# Patient Record
Sex: Male | Born: 1935 | Race: Black or African American | Hispanic: No | Marital: Married | Smoking: Former smoker
Health system: Southern US, Community
[De-identification: ages and names within clinical notes are randomized; demographics above are authoritative.]

## PROBLEM LIST (undated history)

## (undated) DIAGNOSIS — F1011 Alcohol abuse, in remission: Secondary | ICD-10-CM

## (undated) DIAGNOSIS — I6529 Occlusion and stenosis of unspecified carotid artery: Secondary | ICD-10-CM

## (undated) DIAGNOSIS — I341 Nonrheumatic mitral (valve) prolapse: Secondary | ICD-10-CM

## (undated) DIAGNOSIS — N529 Male erectile dysfunction, unspecified: Secondary | ICD-10-CM

## (undated) DIAGNOSIS — I2510 Atherosclerotic heart disease of native coronary artery without angina pectoris: Secondary | ICD-10-CM

## (undated) DIAGNOSIS — N183 Chronic kidney disease, stage 3 unspecified: Secondary | ICD-10-CM

## (undated) DIAGNOSIS — E0590 Thyrotoxicosis, unspecified without thyrotoxic crisis or storm: Secondary | ICD-10-CM

## (undated) DIAGNOSIS — K279 Peptic ulcer, site unspecified, unspecified as acute or chronic, without hemorrhage or perforation: Secondary | ICD-10-CM

## (undated) DIAGNOSIS — G4733 Obstructive sleep apnea (adult) (pediatric): Secondary | ICD-10-CM

## (undated) DIAGNOSIS — I10 Essential (primary) hypertension: Secondary | ICD-10-CM

## (undated) DIAGNOSIS — R7303 Prediabetes: Secondary | ICD-10-CM

## (undated) DIAGNOSIS — E042 Nontoxic multinodular goiter: Secondary | ICD-10-CM

## (undated) DIAGNOSIS — I340 Nonrheumatic mitral (valve) insufficiency: Secondary | ICD-10-CM

## (undated) DIAGNOSIS — E785 Hyperlipidemia, unspecified: Secondary | ICD-10-CM

## (undated) DIAGNOSIS — N400 Enlarged prostate without lower urinary tract symptoms: Secondary | ICD-10-CM

## (undated) DIAGNOSIS — R413 Other amnesia: Secondary | ICD-10-CM

## (undated) DIAGNOSIS — I5189 Other ill-defined heart diseases: Secondary | ICD-10-CM

## (undated) DIAGNOSIS — I272 Pulmonary hypertension, unspecified: Secondary | ICD-10-CM

## (undated) DIAGNOSIS — Z9289 Personal history of other medical treatment: Secondary | ICD-10-CM

## (undated) DIAGNOSIS — G478 Other sleep disorders: Secondary | ICD-10-CM

## (undated) DIAGNOSIS — D649 Anemia, unspecified: Secondary | ICD-10-CM

## (undated) DIAGNOSIS — R011 Cardiac murmur, unspecified: Secondary | ICD-10-CM

## (undated) DIAGNOSIS — N19 Unspecified kidney failure: Secondary | ICD-10-CM

## (undated) DIAGNOSIS — E0500 Thyrotoxicosis with diffuse goiter without thyrotoxic crisis or storm: Secondary | ICD-10-CM

## (undated) HISTORY — DX: Occlusion and stenosis of unspecified carotid artery: I65.29

## (undated) HISTORY — DX: Unspecified kidney failure: N19

## (undated) HISTORY — DX: Nontoxic multinodular goiter: E04.2

## (undated) HISTORY — DX: Atherosclerotic heart disease of native coronary artery without angina pectoris: I25.10

## (undated) HISTORY — DX: Alcohol abuse, in remission: F10.11

## (undated) HISTORY — DX: Hyperlipidemia, unspecified: E78.5

## (undated) HISTORY — DX: Pulmonary hypertension, unspecified: I27.20

## (undated) HISTORY — DX: Obstructive sleep apnea (adult) (pediatric): G47.33

## (undated) HISTORY — DX: Male erectile dysfunction, unspecified: N52.9

## (undated) HISTORY — DX: Peptic ulcer, site unspecified, unspecified as acute or chronic, without hemorrhage or perforation: K27.9

## (undated) HISTORY — DX: Nonrheumatic mitral (valve) prolapse: I34.1

## (undated) HISTORY — DX: Essential (primary) hypertension: I10

## (undated) HISTORY — DX: Personal history of other medical treatment: Z92.89

## (undated) HISTORY — DX: Other sleep disorders: G47.8

## (undated) HISTORY — DX: Chronic kidney disease, stage 3 (moderate): N18.3

## (undated) HISTORY — DX: Thyrotoxicosis, unspecified without thyrotoxic crisis or storm: E05.90

## (undated) HISTORY — DX: Cardiac murmur, unspecified: R01.1

## (undated) HISTORY — DX: Thyrotoxicosis with diffuse goiter without thyrotoxic crisis or storm: E05.00

## (undated) HISTORY — PX: OTHER SURGICAL HISTORY: SHX169

## (undated) HISTORY — DX: Chronic kidney disease, stage 3 unspecified: N18.30

## (undated) HISTORY — PX: CARDIAC CATHETERIZATION: SHX172

## (undated) HISTORY — DX: Benign prostatic hyperplasia without lower urinary tract symptoms: N40.0

## (undated) HISTORY — DX: Other amnesia: R41.3

## (undated) HISTORY — PX: APPENDECTOMY: SHX54

## (undated) HISTORY — DX: Nonrheumatic mitral (valve) insufficiency: I34.0

## (undated) HISTORY — DX: Other ill-defined heart diseases: I51.89

---

## 1999-01-20 ENCOUNTER — Ambulatory Visit (HOSPITAL_COMMUNITY): Admission: RE | Admit: 1999-01-20 | Discharge: 1999-01-20 | Payer: Self-pay | Admitting: Gastroenterology

## 2001-03-17 ENCOUNTER — Ambulatory Visit (HOSPITAL_COMMUNITY): Admission: RE | Admit: 2001-03-17 | Discharge: 2001-03-17 | Payer: Self-pay | Admitting: Cardiology

## 2001-06-29 ENCOUNTER — Encounter: Admission: RE | Admit: 2001-06-29 | Discharge: 2001-06-29 | Payer: Self-pay | Admitting: Nephrology

## 2001-06-29 ENCOUNTER — Encounter: Payer: Self-pay | Admitting: Nephrology

## 2009-10-27 HISTORY — PX: HERNIA REPAIR: SHX51

## 2009-11-21 ENCOUNTER — Encounter: Admission: RE | Admit: 2009-11-21 | Discharge: 2009-11-21 | Payer: Self-pay | Admitting: General Surgery

## 2009-11-26 ENCOUNTER — Ambulatory Visit (HOSPITAL_BASED_OUTPATIENT_CLINIC_OR_DEPARTMENT_OTHER): Admission: RE | Admit: 2009-11-26 | Discharge: 2009-11-26 | Payer: Self-pay | Admitting: General Surgery

## 2010-04-08 ENCOUNTER — Encounter
Admission: RE | Admit: 2010-04-08 | Discharge: 2010-04-08 | Payer: Self-pay | Source: Home / Self Care | Attending: Family Medicine | Admitting: Family Medicine

## 2010-04-10 ENCOUNTER — Encounter
Admission: RE | Admit: 2010-04-10 | Discharge: 2010-04-10 | Payer: Self-pay | Source: Home / Self Care | Attending: Family Medicine | Admitting: Family Medicine

## 2010-05-19 ENCOUNTER — Other Ambulatory Visit (HOSPITAL_COMMUNITY): Payer: Self-pay | Admitting: Internal Medicine

## 2010-05-19 DIAGNOSIS — E0590 Thyrotoxicosis, unspecified without thyrotoxic crisis or storm: Secondary | ICD-10-CM

## 2010-05-19 DIAGNOSIS — E049 Nontoxic goiter, unspecified: Secondary | ICD-10-CM

## 2010-05-26 ENCOUNTER — Other Ambulatory Visit (HOSPITAL_COMMUNITY): Payer: Self-pay | Admitting: Cardiology

## 2010-05-26 DIAGNOSIS — I2782 Chronic pulmonary embolism: Secondary | ICD-10-CM

## 2010-05-28 ENCOUNTER — Ambulatory Visit (HOSPITAL_COMMUNITY)
Admission: RE | Admit: 2010-05-28 | Discharge: 2010-05-28 | Disposition: A | Discharge status: Home / Self Care | Payer: Medicare Other | Source: Ambulatory Visit | Attending: Cardiology | Admitting: Cardiology

## 2010-05-28 DIAGNOSIS — J449 Chronic obstructive pulmonary disease, unspecified: Secondary | ICD-10-CM | POA: Insufficient documentation

## 2010-05-28 DIAGNOSIS — J4489 Other specified chronic obstructive pulmonary disease: Secondary | ICD-10-CM | POA: Insufficient documentation

## 2010-05-29 ENCOUNTER — Ambulatory Visit (HOSPITAL_COMMUNITY)
Admission: RE | Admit: 2010-05-29 | Discharge: 2010-05-29 | Disposition: A | Discharge status: Home / Self Care | Payer: Medicare Other | Source: Ambulatory Visit | Attending: Cardiology | Admitting: Cardiology

## 2010-05-29 DIAGNOSIS — R0602 Shortness of breath: Secondary | ICD-10-CM | POA: Insufficient documentation

## 2010-05-29 DIAGNOSIS — I2782 Chronic pulmonary embolism: Secondary | ICD-10-CM

## 2010-05-29 DIAGNOSIS — R079 Chest pain, unspecified: Secondary | ICD-10-CM | POA: Insufficient documentation

## 2010-05-29 MED ORDER — XENON XE 133 GAS
10.0000 | GAS_FOR_INHALATION | Freq: Once | RESPIRATORY_TRACT | Status: AC | PRN
  Administered 2010-05-29: 10 via RESPIRATORY_TRACT

## 2010-05-29 MED ORDER — TECHNETIUM TO 99M ALBUMIN AGGREGATED
6.0000 | Freq: Once | INTRAVENOUS | Status: AC | PRN
  Administered 2010-05-29: 6 via INTRAVENOUS

## 2010-06-11 ENCOUNTER — Ambulatory Visit (HOSPITAL_COMMUNITY): Payer: Self-pay

## 2010-06-12 ENCOUNTER — Other Ambulatory Visit (HOSPITAL_COMMUNITY): Payer: Self-pay

## 2010-06-12 LAB — DIFFERENTIAL
Lymphocytes Relative: 16 % (ref 12–46)
Lymphs Abs: 1.1 10*3/uL (ref 0.7–4.0)
Neutro Abs: 5.1 10*3/uL (ref 1.7–7.7)
Neutrophils Relative %: 72 % (ref 43–77)

## 2010-06-12 LAB — COMPREHENSIVE METABOLIC PANEL
AST: 21 U/L (ref 0–37)
BUN: 28 mg/dL — ABNORMAL HIGH (ref 6–23)
CO2: 28 mEq/L (ref 19–32)
Calcium: 9.7 mg/dL (ref 8.4–10.5)
Creatinine, Ser: 1.61 mg/dL — ABNORMAL HIGH (ref 0.4–1.5)
GFR calc Af Amer: 51 mL/min — ABNORMAL LOW (ref >60)
GFR calc non Af Amer: 42 mL/min — ABNORMAL LOW (ref >60)
Glucose, Bld: 102 mg/dL — ABNORMAL HIGH (ref 70–99)

## 2010-06-12 LAB — GLUCOSE, CAPILLARY: Glucose-Capillary: 122 mg/dL — ABNORMAL HIGH (ref 70–99)

## 2010-06-12 LAB — CBC
Hemoglobin: 12 g/dL — ABNORMAL LOW (ref 13.0–17.0)
MCH: 31.3 pg (ref 26.0–34.0)
MCHC: 34 g/dL (ref 30.0–36.0)

## 2010-06-30 ENCOUNTER — Other Ambulatory Visit (HOSPITAL_COMMUNITY): Payer: Self-pay | Admitting: Internal Medicine

## 2010-06-30 DIAGNOSIS — E042 Nontoxic multinodular goiter: Secondary | ICD-10-CM

## 2010-06-30 DIAGNOSIS — E0590 Thyrotoxicosis, unspecified without thyrotoxic crisis or storm: Secondary | ICD-10-CM

## 2010-07-09 ENCOUNTER — Encounter (HOSPITAL_COMMUNITY)
Admission: RE | Admit: 2010-07-09 | Discharge: 2010-07-09 | Disposition: A | Discharge status: Home / Self Care | Payer: Medicare Other | Source: Ambulatory Visit | Attending: Internal Medicine | Admitting: Internal Medicine

## 2010-07-09 DIAGNOSIS — E042 Nontoxic multinodular goiter: Secondary | ICD-10-CM

## 2010-07-09 DIAGNOSIS — E0590 Thyrotoxicosis, unspecified without thyrotoxic crisis or storm: Secondary | ICD-10-CM

## 2010-07-10 ENCOUNTER — Ambulatory Visit (HOSPITAL_COMMUNITY)
Admission: RE | Admit: 2010-07-10 | Discharge: 2010-07-10 | Disposition: A | Discharge status: Home / Self Care | Payer: Medicare Other | Source: Ambulatory Visit | Attending: Internal Medicine | Admitting: Internal Medicine

## 2010-07-10 DIAGNOSIS — E0520 Thyrotoxicosis with toxic multinodular goiter without thyrotoxic crisis or storm: Secondary | ICD-10-CM | POA: Insufficient documentation

## 2010-07-10 MED ORDER — SODIUM IODIDE I 131 CAPSULE
15.0000 | Freq: Once | INTRAVENOUS | Status: AC | PRN
  Administered 2010-07-10: 15 via ORAL

## 2010-07-10 MED ORDER — SODIUM PERTECHNETATE TC 99M INJECTION
10.0000 | Freq: Once | INTRAVENOUS | Status: AC | PRN
  Administered 2010-07-10: 10 via INTRAVENOUS

## 2010-08-07 ENCOUNTER — Other Ambulatory Visit (HOSPITAL_COMMUNITY): Payer: Self-pay | Admitting: Internal Medicine

## 2010-08-07 DIAGNOSIS — E0590 Thyrotoxicosis, unspecified without thyrotoxic crisis or storm: Secondary | ICD-10-CM

## 2010-08-14 ENCOUNTER — Encounter (HOSPITAL_COMMUNITY)
Admission: RE | Admit: 2010-08-14 | Discharge: 2010-08-14 | Disposition: A | Discharge status: Home / Self Care | Payer: Medicare Other | Source: Ambulatory Visit | Attending: Internal Medicine | Admitting: Internal Medicine

## 2010-08-14 DIAGNOSIS — E0590 Thyrotoxicosis, unspecified without thyrotoxic crisis or storm: Secondary | ICD-10-CM | POA: Insufficient documentation

## 2010-08-14 NOTE — Cardiovascular Report (Signed)
[place]. [facility]  Patient:    [name], [name] Visit Number: [id number] MRN: [id number]          Service Type: CAT Location: [place] 01 Attending Physician:  Laverda Page Dictated by:   Kela Millin, M.D. Proc. Date: 03/17/01 Admit Date:  03/17/2001 Discharge Date: 03/17/2001   CC:         Sherren Kerns. Pamella Pert, M.D.  [facility] [address] [place]   Cardiac Catheterization  PROCEDURE: 1. Selective left ventriculography. 2. Selective left and right coronary arteriography. 3. Selective left and right renal arteriography. 4. Right femoral arteriography and closure of right femoral artery access    site with Perclose.  CARDIOLOGIST:  Kela Millin, M.D.  INDICATIONS:  Jeffrey Campbell is a 77 year old African-American male with history of hypertension.  He was referred to me by Dr. Pamella Pert after he had an exercise stress test for chest pain.  The stress test was abnormal.  Also, his symptoms were fairly typical for angina pectoris.  He also had difficult-to-control hypertension and had recently been very difficult to control.  Due to this suspicion for uncontrolled hypertension and underlying coronary artery disease, he was referred for cardiac catheterization and renal arteriography.  HEMODYNAMIC DATA:  Left ventricle pressures were 124/4 with an end-diastolic pressure of 14 mmHg.  The central aortic pressures were 129/63 with a mean of 86 mmHg. There was no pressure gradient across the aortic valve.  ANGIOGRAPHIC DATA:  CORONARY ARTERIES:  Left ventriculography: The left ventriculography revealed well preserved left ventricular function, ejection fraction 60%.  There was no significant mitral regurgitation.  CORONARY ARTERIOGRAPHY:  Right coronary artery: The right coronary artery is mildly calcified.  It is tortuous in the proximal segment.  Just before the origin of a moderate size RV acute  marginal branch, there is a 60% stenosis which appears to be eccentric.  In the mid segment at the crux, there is about a 40% stenosis.  It gives origin to a very large PDA which had moderate diffuse disease in the distal segment.  The posterolateral branch is very small.  Left main coronary artery: The left main coronary artery is a large caliber vessel.  It is mildly calcified.  It bifurcates into the left anterior descending artery and circumflex coronary artery.  Left anterior descending artery:  Left anterior descending artery is a large caliber vessel.  It is mildly calcified.  The ostium of the left anterior descending artery is also calcified, has about 50, maybe at the most 60% stenosis.   The LAD gives origin to a moderate to large size diagonal #1 and diagonal #2 which have ostial calcification with 20% ostial stenosis.  Just after the origin of diagonal #2, there is a 50 to 60% calcific stenosis of the mid left anterior descending artery.  There is a small diagonal #3.  The LAD otherwise has mild disease distally.  Circumflex coronary artery: The circumflex coronary artery is a large caliber vessel.  It gives origin to small obtuse marginal #1 and and a moderate size obtuse marginal #2 and a larger obtuse marginal #3.  After the origin of a small obtuse marginal #1, there is about 30% stenosis.  The distal circumflex has mild luminal irregularities.  VENAL ARTERIOGRAPHY:  The abdominal aorta has mild to moderate atherosclerotic changes with mild calcification.  The right renal artery has ostial 60% stenosis.  The rest of the artery is widely patent and a large caliber  vessel.  The renal parenchyma has a normal blush, and the renal artery size appears to be normal.  The left renal artery has about 70 to 80% ostial stenosis with mild calcification.  The renal parenchyma is normal, and the renal artery size appears to be normal.  FINAL INTERPRETATION: 1. Normal left  ventricular systolic function, ejection fraction 60%. 2. Borderline lesion of the mid right coronary artery with moderate diffuse    disease distally. 3. Ostial and mid left anterior descending artery borderline lesions with    mild calcification. 4. Bilateral renal artery stenosis.  RECOMMENDATIONS: 1. Due to borderline lesions, the patient will undergo Cardiolite stress test    to evaluate the functional significant of these lesions. 2. Renal duplex scan will be performed.  The patient will possibly need renal    angioplasty for uncontrolled hypertension and for preservation of    renal parenchyma. 3. Aggressive risk factor modification is indicated including control of    hypertension.  TECHNIQUE OF PROCEDURE:  Under the the usual sterile precautions using 6-French right femoral access, a 6-French multipurpose B2 catheter was advanced into the ascending aorta over the 0.035 mm J wire.  The catheter was gently advanced into the left ventricle and left ventricle pressures were monitored.  Hand contrast injection of left ventricle was performed in the RAO projection.  The catheter was then flushed with saline, pulled back into the ascending aorta, and pressure gradient across the aortic valve was monitored. The right coronary artery was selectively engaged, and arteriography was performed.  Then the catheter was pulled back into the abdominal aorta and selective right and left renal arteriography was performed.  Then the catheter was pulled out of the body.  A 6-French Judkins left diagnostic catheter was advanced in the ascending aorta over a 0.035 mm J wire.  The left main coronary artery was selectively engaged, and arteriography was performed. Then the catheter was pulled out of the body over the 0.035 mm J wire.  Right femoral arteriography was performed in the RAO projection, and Perclose  closure was attempted.  However, because of inability to attain adequate hemostasis,  manual pressure was held, and the patient was transferred to the floor in stable condition. Dictated by:   Kela Millin, M.D. Attending Physician:  Laverda Page DD:  03/17/01 TD:  03/18/01 Job: [id number]

## 2010-08-26 DIAGNOSIS — E0590 Thyrotoxicosis, unspecified without thyrotoxic crisis or storm: Secondary | ICD-10-CM

## 2010-08-26 HISTORY — DX: Thyrotoxicosis, unspecified without thyrotoxic crisis or storm: E05.90

## 2010-08-26 MED ORDER — SODIUM IODIDE I 131 CAPSULE: 16.1000 | Freq: Once | INTRAVENOUS | Status: DC | PRN

## 2010-08-26 MED ORDER — SODIUM IODIDE I 131 CAPSULE: 16.1000 | Freq: Once | INTRAVENOUS | Status: AC | PRN

## 2010-09-18 ENCOUNTER — Encounter: Payer: Self-pay | Admitting: Critical Care Medicine

## 2010-09-21 ENCOUNTER — Ambulatory Visit (INDEPENDENT_AMBULATORY_CARE_PROVIDER_SITE_OTHER): Payer: Medicare Other | Admitting: Critical Care Medicine

## 2010-09-21 ENCOUNTER — Encounter: Payer: Self-pay | Admitting: Critical Care Medicine

## 2010-09-21 DIAGNOSIS — G4733 Obstructive sleep apnea (adult) (pediatric): Secondary | ICD-10-CM

## 2010-09-21 DIAGNOSIS — J449 Chronic obstructive pulmonary disease, unspecified: Secondary | ICD-10-CM | POA: Insufficient documentation

## 2010-09-21 DIAGNOSIS — I2510 Atherosclerotic heart disease of native coronary artery without angina pectoris: Secondary | ICD-10-CM | POA: Insufficient documentation

## 2010-09-21 DIAGNOSIS — E0590 Thyrotoxicosis, unspecified without thyrotoxic crisis or storm: Secondary | ICD-10-CM

## 2010-09-21 DIAGNOSIS — N183 Chronic kidney disease, stage 3 unspecified: Secondary | ICD-10-CM

## 2010-09-21 DIAGNOSIS — I10 Essential (primary) hypertension: Secondary | ICD-10-CM

## 2010-09-21 DIAGNOSIS — E785 Hyperlipidemia, unspecified: Secondary | ICD-10-CM

## 2010-09-21 DIAGNOSIS — I130 Hypertensive heart and chronic kidney disease with heart failure and stage 1 through stage 4 chronic kidney disease, or unspecified chronic kidney disease: Secondary | ICD-10-CM | POA: Insufficient documentation

## 2010-09-21 DIAGNOSIS — J988 Other specified respiratory disorders: Secondary | ICD-10-CM

## 2010-09-21 DIAGNOSIS — E119 Type 2 diabetes mellitus without complications: Secondary | ICD-10-CM

## 2010-09-21 DIAGNOSIS — E039 Hypothyroidism, unspecified: Secondary | ICD-10-CM | POA: Insufficient documentation

## 2010-09-21 DIAGNOSIS — K279 Peptic ulcer, site unspecified, unspecified as acute or chronic, without hemorrhage or perforation: Secondary | ICD-10-CM

## 2010-09-21 DIAGNOSIS — G478 Other sleep disorders: Secondary | ICD-10-CM

## 2010-09-21 MED ORDER — TIOTROPIUM BROMIDE MONOHYDRATE 18 MCG IN CAPS: 18.0000 ug | ORAL_CAPSULE | Freq: Every day | RESPIRATORY_TRACT | Status: DC

## 2010-09-21 NOTE — Progress Notes (Signed)
Subjective:    Patient ID: Jeffrey Campbell, male    DOB: 1932-05-30, 77 y.o.   MRN: [id number]  HPI  77 y.o.AAM pulm HTN on echo.  Saw PCP  ? Heart murmur.  Then Echo done and pulm HTN.  Then Sleep study done, insurance would not pay for Bipap. This therapy not yet started.  Airflow obstruction on PFTs   No smoking in 31yrs.  Since 88 no smoking.  Prior to this was a heavy smoker. No real wheezing.  Pt notes some dyspnea since had heart eval.  Now is noting dyspnea and leg edema.  No real cough.  No real chest tightness.   Past Medical History  Diagnosis Date  . PUD (peptic ulcer disease)   . Hypertension   . Hyperlipemia   . CAD (coronary artery disease)   . Shutdown renal     after cardiac cath  . Organic impotence   . CKD (chronic kidney disease), stage III   . History of ETOH abuse   . Type 2 diabetes, diet controlled   . Pulmonary hypertension   . Hyperthyroidism   . Multiple thyroid nodules   . Upper airway resistance syndrome   . Carotid artery bruit   . Heart murmur, systolic   . OSA (obstructive sleep apnea)      Family History  Problem Relation Age of Onset  . Kidney failure Father   . Hypertension Father   . Colon cancer Mother   . Rectal cancer Sister   . Colon cancer Brother   . Lung disease Brother      History   Social History  . Marital Status: Married    Spouse Name: N/A    Number of Children: 4  . Years of Education: N/A   Occupational History  . Retired     [employer]   Social History Main Topics  . Smoking status: Former Smoker -- 2.0 packs/day for 40 years    Types: Cigarettes    Quit date: 03/29/1984  . Smokeless tobacco: Never Used  . Alcohol Use: No     quit in 1981  . Drug Use: No  . Sexually Active: Not on file   Other Topics Concern  . Not on file   Social History Narrative  . No narrative on file     Allergies  Allergen Reactions  . Zocor (Simvastatin)      No outpatient prescriptions prior to visit.       Review of Systems  Constitutional: Positive for diaphoresis and unexpected weight change. Negative for fever, chills, activity change, appetite change and fatigue.  HENT: Positive for postnasal drip. Negative for hearing loss, ear pain, nosebleeds, congestion, sore throat, facial swelling, rhinorrhea, sneezing, mouth sores, trouble swallowing, neck pain, neck stiffness, dental problem, voice change, sinus pressure, tinnitus and ear discharge.   Eyes: Negative for photophobia, discharge, itching and visual disturbance.  Respiratory: Positive for shortness of breath. Negative for apnea, cough, choking, chest tightness, wheezing and stridor.   Cardiovascular: Positive for chest pain and leg swelling. Negative for palpitations.  Gastrointestinal: Negative for nausea, vomiting, abdominal pain, constipation, blood in stool and abdominal distention.  Genitourinary: Positive for frequency. Negative for dysuria, urgency, hematuria, flank pain, decreased urine volume and difficulty urinating.  Musculoskeletal: Positive for back pain. Negative for myalgias, joint swelling, arthralgias and gait problem.  Skin: Negative for color change, pallor and rash.  Neurological: Negative for dizziness, tremors, seizures, syncope, speech difficulty, weakness, light-headedness, numbness and headaches.  Hematological: Negative for adenopathy. Does not bruise/bleed easily.  Psychiatric/Behavioral: Negative for confusion, sleep disturbance and agitation. The patient is not nervous/anxious.        Objective:   Physical Exam Filed Vitals:   09/21/10 1607  BP: 162/58  Pulse: 51  Temp: 98.1 F (36.7 C)  TempSrc: Oral  Height: 5\' 9"  (1.753 m)  Weight: 162 lb 6.4 oz (73.664 kg)  SpO2: 96%    Gen: Pleasant, well-nourished, in no distress,  normal affect  ENT: No lesions,  mouth clear,  oropharynx clear, no postnasal drip  Neck: No JVD, no TMG, no carotid bruits  Lungs: No use of accessory muscles, no  dullness to percussion, distant BS  Cardiovascular: RRR, heart sounds normal, no murmur or gallops, no peripheral edema  Abdomen: soft and NT, no HSM,  BS normal  Musculoskeletal: No deformities, no cyanosis or clubbing  Neuro: alert, non focal  Skin: Warm, no lesions or rashes      CXR mod copd changes Arlyce Harman: Mod airflow obstruction Echo: reviewed: mod pulm HTN  Assessment & Plan:   COPD (chronic obstructive pulmonary disease) Moderate COPD with airflow obstruction as cause for pulm htn in addition to sleep apnea Plan F/u cpap therapy per dr turner Start spiriva     Updated Medication List Outpatient Encounter Prescriptions as of 09/21/2010  Medication Sig Dispense Refill  . amlodipine-atorvastatin (CADUET) 10-20 MG per tablet Once daily      . aspirin 325 MG tablet Take 325 mg by mouth daily.        . carvedilol (COREG) 25 MG tablet 1/2 tablet twice daily      . furosemide (LASIX) 40 MG tablet 1/2 tablet as needed if home weight . 155 or onset of shortness of breath      . losartan (COZAAR) 100 MG tablet 1/2 tablet twice daily      . Multiple Vitamin (MULTIVITAMIN) tablet Take 1 tablet by mouth daily.        . nitroGLYCERIN (NITROSTAT) 0.4 MG SL tablet Place 0.4 mg under the tongue every 5 (five) minutes as needed.        . TRAVATAN Z 0.004 % ophthalmic solution Daily at bedtime in each eye      . WELCHOL 625 MG tablet Once daily      . tiotropium (SPIRIVA HANDIHALER) 18 MCG inhalation capsule Place 1 capsule (18 mcg total) into inhaler and inhale daily.  30 capsule  6

## 2010-09-21 NOTE — Patient Instructions (Signed)
Start Spiriva daily Follow up with Dr Radford Pax on your sleep treatment Return 4 months

## 2010-09-22 ENCOUNTER — Encounter: Payer: Self-pay | Admitting: Critical Care Medicine

## 2010-09-22 ENCOUNTER — Telehealth: Payer: Self-pay | Admitting: Critical Care Medicine

## 2010-09-22 NOTE — Telephone Encounter (Signed)
i spoke to the pt 

## 2010-09-22 NOTE — Assessment & Plan Note (Addendum)
Moderate COPD with airflow obstruction as cause for pulm htn in addition to sleep apnea Plan F/u cpap therapy per dr Radford Pax Start spiriva

## 2010-09-22 NOTE — Telephone Encounter (Signed)
Spoke with pt.  He states that he was reading spiriva pamphlet and is concerned b/c it states that you should not take med with enlarged prostate or if you have glaucoma. He states that he has both of these conditions and wants to know if PW thinks it is okay for him to take. Please advise, thanks!

## 2010-09-24 ENCOUNTER — Encounter: Payer: Self-pay | Admitting: Critical Care Medicine

## 2010-09-25 ENCOUNTER — Telehealth: Payer: Self-pay | Admitting: Critical Care Medicine

## 2010-09-25 DIAGNOSIS — J449 Chronic obstructive pulmonary disease, unspecified: Secondary | ICD-10-CM

## 2010-09-25 MED ORDER — TIOTROPIUM BROMIDE MONOHYDRATE 18 MCG IN CAPS: 18.0000 ug | ORAL_CAPSULE | Freq: Every day | RESPIRATORY_TRACT | Status: DC

## 2010-09-25 NOTE — Telephone Encounter (Signed)
Pt request rx be sent to [facility] for 90 day instead of local pharmacy. Pt also request sample to last until mail order comes. Sample at from rx sent. Pt aware. Carpio Bing, CMA

## 2011-01-26 ENCOUNTER — Ambulatory Visit (INDEPENDENT_AMBULATORY_CARE_PROVIDER_SITE_OTHER): Payer: Medicare Other | Admitting: Critical Care Medicine

## 2011-01-26 ENCOUNTER — Encounter: Payer: Self-pay | Admitting: Critical Care Medicine

## 2011-01-26 VITALS — BP 162/72 | HR 50 | Temp 97.7°F | Ht 69.0 in | Wt 176.8 lb

## 2011-01-26 DIAGNOSIS — J449 Chronic obstructive pulmonary disease, unspecified: Secondary | ICD-10-CM

## 2011-01-26 MED ORDER — BUDESONIDE-FORMOTEROL FUMARATE 160-4.5 MCG/ACT IN AERO: 2.0000 | INHALATION_SPRAY | Freq: Two times a day (BID) | RESPIRATORY_TRACT | Status: DC

## 2011-01-26 NOTE — Progress Notes (Signed)
P Subjective:    Patient ID: Jeffrey Campbell, male    DOB: 07-03-1932, 77 y.o.   MRN: [id number]  HPI   77 y.o.AAM pulm HTN on echo.  Saw PCP  ? Heart murmur.  Then Echo done and pulm HTN.  Then Sleep study done, insurance would not pay for Bipap. This therapy not yet started.  Airflow obstruction on PFTs   No smoking in 51yrs.  Since 88 no smoking.  Prior to this was a heavy smoker. No real wheezing.  Pt notes some dyspnea since had heart eval.  Now is noting dyspnea and leg edema.  No real cough.  No real chest tightness.  10/30 Did not get cpap.  Insurance would not approve , notes dry mouth and no change on spiriva. No real cough. Phlegm is thick and sticky.  Mouth real dry.  Awakens with nose irritated.   Past Medical History  Diagnosis Date  . PUD (peptic ulcer disease)   . Hypertension   . Hyperlipemia   . CAD (coronary artery disease)   . Shutdown renal     after cardiac cath  . Organic impotence   . CKD (chronic kidney disease), stage III   . History of ETOH abuse   . Type 2 diabetes, diet controlled   . Pulmonary hypertension   . Hyperthyroidism   . Multiple thyroid nodules   . Upper airway resistance syndrome   . Carotid artery bruit   . Heart murmur, systolic   . OSA (obstructive sleep apnea)   . Glaucoma   . BPH (benign prostatic hyperplasia)      Family History  Problem Relation Age of Onset  . Kidney failure Father   . Hypertension Father   . Colon cancer Mother   . Rectal cancer Sister   . Colon cancer Brother   . Lung disease Brother      History   Social History  . Marital Status: Married    Spouse Name: N/A    Number of Children: 4  . Years of Education: N/A   Occupational History  . Retired     [employer]   Social History Main Topics  . Smoking status: Former Smoker -- 2.0 packs/day for 40 years    Types: Cigarettes    Quit date: 03/29/1984  . Smokeless tobacco: Never Used  . Alcohol Use: No     quit in 1981  . Drug Use: No    . Sexually Active: Not on file   Other Topics Concern  . Not on file   Social History Narrative  . No narrative on file     Allergies  Allergen Reactions  . Spiriva Handihaler Other (See Comments)    Dry mouth  . Zocor (Simvastatin)      Outpatient Prescriptions Prior to Visit  Medication Sig Dispense Refill  . amlodipine-atorvastatin (CADUET) 10-20 MG per tablet Once daily      . furosemide (LASIX) 40 MG tablet 1/2 tablet as needed if home weight . 155 or onset of shortness of breath      . losartan (COZAAR) 100 MG tablet 1/2 tablet twice daily      . Multiple Vitamin (MULTIVITAMIN) tablet Take 1 tablet by mouth daily.        . nitroGLYCERIN (NITROSTAT) 0.4 MG SL tablet Place 0.4 mg under the tongue every 5 (five) minutes as needed.        . TRAVATAN Z 0.004 % ophthalmic solution Daily at bedtime in  each eye      . WELCHOL 625 MG tablet 3 tablets Once daily      . tiotropium (SPIRIVA HANDIHALER) 18 MCG inhalation capsule Place 1 capsule (18 mcg total) into inhaler and inhale daily.  90 capsule  2  . aspirin 325 MG tablet Take 325 mg by mouth daily.        . carvedilol (COREG) 25 MG tablet 1/2 tablet twice daily          Review of Systems  Constitutional: Positive for diaphoresis. Negative for fever, chills, activity change, appetite change, fatigue and unexpected weight change.  HENT: Positive for postnasal drip. Negative for hearing loss, ear pain, nosebleeds, congestion, sore throat, facial swelling, rhinorrhea, sneezing, mouth sores, trouble swallowing, neck pain, neck stiffness, dental problem, voice change, sinus pressure, tinnitus and ear discharge.   Eyes: Negative for photophobia, discharge, itching and visual disturbance.  Respiratory: Positive for shortness of breath. Negative for apnea, cough, choking, chest tightness, wheezing and stridor.   Cardiovascular: Negative for chest pain, palpitations and leg swelling.  Gastrointestinal: Negative for nausea, vomiting,  abdominal pain, constipation, blood in stool and abdominal distention.  Genitourinary: Positive for frequency. Negative for dysuria, urgency, hematuria, flank pain, decreased urine volume and difficulty urinating.  Musculoskeletal: Positive for back pain. Negative for myalgias, joint swelling, arthralgias and gait problem.  Skin: Negative for color change, pallor and rash.  Neurological: Negative for dizziness, tremors, seizures, syncope, speech difficulty, weakness, light-headedness, numbness and headaches.  Hematological: Negative for adenopathy. Does not bruise/bleed easily.  Psychiatric/Behavioral: Negative for confusion, sleep disturbance and agitation. The patient is not nervous/anxious.        Objective:   Physical Exam  Filed Vitals:   01/26/11 0924  BP: 162/72  Pulse: 50  Temp: 97.7 F (36.5 C)  TempSrc: Oral  Height: 5\' 9"  (1.753 m)  Weight: 176 lb 12.8 oz (80.196 kg)  SpO2: 99%    Gen: Pleasant, well-nourished, in no distress,  normal affect  ENT: No lesions,  mouth clear,  oropharynx clear, no postnasal drip  Neck: No JVD, no TMG, no carotid bruits  Lungs: No use of accessory muscles, no dullness to percussion, distant BS  Cardiovascular: RRR, heart sounds normal, no murmur or gallops, no peripheral edema  Abdomen: soft and NT, no HSM,  BS normal  Musculoskeletal: No deformities, no cyanosis or clubbing  Neuro: alert, non focal  Skin: Warm, no lesions or rashes      CXR mod copd changes Arlyce Harman: Mod airflow obstruction Echo: reviewed: mod pulm HTN  Assessment & Plan:   COPD (chronic obstructive pulmonary disease) Golds stage III COPD with moderate pulmonary hypertension Significant side effects with Spiriva including dry mouth and excess secretions Plan Stop Spiriva  Start Symbicort 160 two puff twice daily The patient was instructed as to proper HFA use and has about a 60% usage efficiency The patient will continue training on HFA device at return  in one month, samples were given of the Symbicort     Updated Medication List Outpatient Encounter Prescriptions as of 01/26/2011  Medication Sig Dispense Refill  . amlodipine-atorvastatin (CADUET) 10-20 MG per tablet Once daily      . aspirin 81 MG tablet Take 81 mg by mouth daily.        . cloNIDine (CATAPRES) 0.1 MG tablet Take 0.5 tablets by mouth Twice daily.      . furosemide (LASIX) 40 MG tablet 1/2 tablet as needed if home weight . 155 or onset of  shortness of breath      . levothyroxine (SYNTHROID, LEVOTHROID) 50 MCG tablet Take 1 tablet by mouth Every morning.      Marland Kitchen losartan (COZAAR) 100 MG tablet 1/2 tablet twice daily      . Multiple Vitamin (MULTIVITAMIN) tablet Take 1 tablet by mouth daily.        . nitroGLYCERIN (NITROSTAT) 0.4 MG SL tablet Place 0.4 mg under the tongue every 5 (five) minutes as needed.        . TRAVATAN Z 0.004 % ophthalmic solution Daily at bedtime in each eye      . WELCHOL 625 MG tablet 3 tablets Once daily      . DISCONTD: tiotropium (SPIRIVA HANDIHALER) 18 MCG inhalation capsule Place 1 capsule (18 mcg total) into inhaler and inhale daily.  90 capsule  2  . budesonide-formoterol (SYMBICORT) 160-4.5 MCG/ACT inhaler Inhale 2 puffs into the lungs 2 (two) times daily.  1 Inhaler  2  . DISCONTD: aspirin 325 MG tablet Take 325 mg by mouth daily.        Marland Kitchen DISCONTD: carvedilol (COREG) 25 MG tablet 1/2 tablet twice daily

## 2011-01-26 NOTE — Patient Instructions (Signed)
Stop spiriva Start Symbicort two puff twice daily, use samples Return 1 month

## 2011-01-26 NOTE — Assessment & Plan Note (Addendum)
Golds stage III COPD with moderate pulmonary hypertension Significant side effects with Spiriva including dry mouth and excess secretions Plan Stop Spiriva  Start Symbicort 160 two puff twice daily The patient was instructed as to proper HFA use and has about a 60% usage efficiency The patient will continue training on HFA device at return in one month, samples were given of the Symbicort

## 2011-02-26 ENCOUNTER — Ambulatory Visit (INDEPENDENT_AMBULATORY_CARE_PROVIDER_SITE_OTHER): Payer: Medicare Other | Admitting: Critical Care Medicine

## 2011-02-26 ENCOUNTER — Encounter: Payer: Self-pay | Admitting: Critical Care Medicine

## 2011-02-26 DIAGNOSIS — J449 Chronic obstructive pulmonary disease, unspecified: Secondary | ICD-10-CM

## 2011-02-26 MED ORDER — BUDESONIDE-FORMOTEROL FUMARATE 160-4.5 MCG/ACT IN AERO: 2.0000 | INHALATION_SPRAY | Freq: Two times a day (BID) | RESPIRATORY_TRACT | Status: DC

## 2011-02-26 NOTE — Progress Notes (Signed)
P Subjective:    Patient ID: Jeffrey Campbell, male    DOB: 02-06-1933, 77 y.o.   MRN: [id number]  HPI   77 y.o.AAM pulm HTN on echo.  Saw PCP  ? Heart murmur.  Then Echo done and pulm HTN.  Then Sleep study done, insurance would not pay for Bipap. This therapy not yet started.  Airflow obstruction on PFTs   No smoking in 55yrs.  Since 88 no smoking.  Prior to this was a heavy smoker. No real wheezing.  Pt notes some dyspnea since had heart eval.  Now is noting dyspnea and leg edema.  No real cough.  No real chest tightness.  10/30 Did not get cpap.  Insurance would not approve , notes dry mouth and no change on spiriva. No real cough. Phlegm is thick and sticky.  Mouth real dry.  Awakens with nose irritated.  11/30 Dry mouth better on symbicort vs spiriva.   Dyspnea is ok.  No real chest pain. Mucus is less. Pt denies any significant sore throat, nasal congestion or excess secretions, fever, chills, sweats, unintended weight loss, pleurtic or exertional chest pain, orthopnea PND, or leg swelling Pt denies any increase in rescue therapy over baseline, denies waking up needing it or having any early am or nocturnal exacerbations of coughing/wheezing/or dyspnea. Pt also denies any obvious fluctuation in symptoms with  weather or environmental change or other alleviating or aggravating factors   Past Medical History  Diagnosis Date  . PUD (peptic ulcer disease)   . Hypertension   . Hyperlipemia   . CAD (coronary artery disease)   . Shutdown renal     after cardiac cath  . Organic impotence   . CKD (chronic kidney disease), stage III   . History of ETOH abuse   . Type 2 diabetes, diet controlled   . Pulmonary hypertension   . Hyperthyroidism   . Multiple thyroid nodules   . Upper airway resistance syndrome   . Carotid artery bruit   . Heart murmur, systolic   . OSA (obstructive sleep apnea)   . Glaucoma   . BPH (benign prostatic hyperplasia)      Family History  Problem  Relation Age of Onset  . Kidney failure Father   . Hypertension Father   . Colon cancer Mother   . Rectal cancer Sister   . Colon cancer Brother   . Lung disease Brother      History   Social History  . Marital Status: Married    Spouse Name: N/A    Number of Children: 4  . Years of Education: N/A   Occupational History  . Retired     [employer]   Social History Main Topics  . Smoking status: Former Smoker -- 2.0 packs/day for 40 years    Types: Cigarettes    Quit date: 03/29/1984  . Smokeless tobacco: Never Used  . Alcohol Use: No     quit in 1981  . Drug Use: No  . Sexually Active: Not on file   Other Topics Concern  . Not on file   Social History Narrative  . No narrative on file     Allergies  Allergen Reactions  . Spiriva Handihaler Other (See Comments)    Dry mouth  . Zocor (Simvastatin)      Outpatient Prescriptions Prior to Visit  Medication Sig Dispense Refill  . amlodipine-atorvastatin (CADUET) 10-20 MG per tablet Once daily      . aspirin 81  MG tablet Take 81 mg by mouth daily.        . cloNIDine (CATAPRES) 0.1 MG tablet Take 0.5 tablets by mouth Twice daily.      . furosemide (LASIX) 40 MG tablet 1/2 tablet as needed if home weight . 155 or onset of shortness of breath      . losartan (COZAAR) 100 MG tablet 1/2 tablet twice daily      . Multiple Vitamin (MULTIVITAMIN) tablet Take 1 tablet by mouth daily.        . nitroGLYCERIN (NITROSTAT) 0.4 MG SL tablet Place 0.4 mg under the tongue every 5 (five) minutes as needed.        . TRAVATAN Z 0.004 % ophthalmic solution Daily at bedtime in each eye      . WELCHOL 625 MG tablet 3 tablets Once daily      . budesonide-formoterol (SYMBICORT) 160-4.5 MCG/ACT inhaler Inhale 2 puffs into the lungs 2 (two) times daily.  1 Inhaler  2  . levothyroxine (SYNTHROID, LEVOTHROID) 50 MCG tablet Take 1 tablet by mouth Every morning.          Review of Systems  Constitutional: Positive for diaphoresis.  Negative for fever, chills, activity change, appetite change, fatigue and unexpected weight change.  HENT: Positive for postnasal drip. Negative for hearing loss, ear pain, nosebleeds, congestion, sore throat, facial swelling, rhinorrhea, sneezing, mouth sores, trouble swallowing, neck pain, neck stiffness, dental problem, voice change, sinus pressure, tinnitus and ear discharge.   Eyes: Negative for photophobia, discharge, itching and visual disturbance.  Respiratory: Positive for shortness of breath. Negative for apnea, cough, choking, chest tightness, wheezing and stridor.   Cardiovascular: Negative for chest pain, palpitations and leg swelling.  Gastrointestinal: Negative for nausea, vomiting, abdominal pain, constipation, blood in stool and abdominal distention.  Genitourinary: Positive for frequency. Negative for dysuria, urgency, hematuria, flank pain, decreased urine volume and difficulty urinating.  Musculoskeletal: Positive for back pain. Negative for myalgias, joint swelling, arthralgias and gait problem.  Skin: Negative for color change, pallor and rash.  Neurological: Negative for dizziness, tremors, seizures, syncope, speech difficulty, weakness, light-headedness, numbness and headaches.  Hematological: Negative for adenopathy. Does not bruise/bleed easily.  Psychiatric/Behavioral: Negative for confusion, sleep disturbance and agitation. The patient is not nervous/anxious.        Objective:   Physical Exam  Filed Vitals:   02/26/11 0903  BP: 150/70  Pulse: 49  Temp: 98 F (36.7 C)  TempSrc: Oral  Height: 5' 9.5" (1.765 m)  Weight: 174 lb (78.926 kg)  SpO2: 97%    Gen: Pleasant, well-nourished, in no distress,  normal affect  ENT: No lesions,  mouth clear,  oropharynx clear, no postnasal drip  Neck: No JVD, no TMG, no carotid bruits  Lungs: No use of accessory muscles, no dullness to percussion, distant BS  Cardiovascular: RRR, heart sounds normal, no murmur or  gallops, no peripheral edema  Abdomen: soft and NT, no HSM,  BS normal  Musculoskeletal: No deformities, no cyanosis or clubbing  Neuro: alert, non focal  Skin: Warm, no lesions or rashes      CXR mod copd changes Arlyce Harman: Mod airflow obstruction Echo: reviewed: mod pulm HTN  Assessment & Plan:   COPD (chronic obstructive pulmonary disease) Improved Copd Plan Cont laba/ics      Updated Medication List Outpatient Encounter Prescriptions as of 02/26/2011  Medication Sig Dispense Refill  . amlodipine-atorvastatin (CADUET) 10-20 MG per tablet Once daily      . aspirin 325  MG tablet Take 325 mg by mouth daily.        Marland Kitchen aspirin 81 MG tablet Take 81 mg by mouth daily.        . budesonide-formoterol (SYMBICORT) 160-4.5 MCG/ACT inhaler Inhale 2 puffs into the lungs 2 (two) times daily.  3 Inhaler  4  . cloNIDine (CATAPRES) 0.1 MG tablet Take 0.5 tablets by mouth Twice daily.      . furosemide (LASIX) 40 MG tablet 1/2 tablet as needed if home weight . 155 or onset of shortness of breath      . levothyroxine (SYNTHROID, LEVOTHROID) 75 MCG tablet Take 75 mcg by mouth daily.        Marland Kitchen losartan (COZAAR) 100 MG tablet 1/2 tablet twice daily      . Multiple Vitamin (MULTIVITAMIN) tablet Take 1 tablet by mouth daily.        . nitroGLYCERIN (NITROSTAT) 0.4 MG SL tablet Place 0.4 mg under the tongue every 5 (five) minutes as needed.        . TRAVATAN Z 0.004 % ophthalmic solution Daily at bedtime in each eye      . WELCHOL 625 MG tablet 3 tablets Once daily      . DISCONTD: budesonide-formoterol (SYMBICORT) 160-4.5 MCG/ACT inhaler Inhale 2 puffs into the lungs 2 (two) times daily.  1 Inhaler  2  . DISCONTD: levothyroxine (SYNTHROID, LEVOTHROID) 50 MCG tablet Take 1 tablet by mouth Every morning.      Marland Kitchen DISCONTD: SPIRIVA HANDIHALER 18 MCG inhalation capsule

## 2011-02-26 NOTE — Patient Instructions (Signed)
No change in medications. Return in         4 months 

## 2011-02-27 NOTE — Assessment & Plan Note (Signed)
Improved Copd Plan Cont laba/ics

## 2011-04-06 DIAGNOSIS — H4011X0 Primary open-angle glaucoma, stage unspecified: Secondary | ICD-10-CM | POA: Diagnosis not present

## 2011-04-06 DIAGNOSIS — H348392 Tributary (branch) retinal vein occlusion, unspecified eye, stable: Secondary | ICD-10-CM | POA: Diagnosis not present

## 2011-04-06 DIAGNOSIS — H3560 Retinal hemorrhage, unspecified eye: Secondary | ICD-10-CM | POA: Diagnosis not present

## 2011-04-06 DIAGNOSIS — H35059 Retinal neovascularization, unspecified, unspecified eye: Secondary | ICD-10-CM | POA: Diagnosis not present

## 2011-04-09 ENCOUNTER — Telehealth: Payer: Self-pay | Admitting: Critical Care Medicine

## 2011-04-09 DIAGNOSIS — H409 Unspecified glaucoma: Secondary | ICD-10-CM | POA: Insufficient documentation

## 2011-04-09 DIAGNOSIS — E890 Postprocedural hypothyroidism: Secondary | ICD-10-CM | POA: Diagnosis not present

## 2011-04-09 NOTE — Telephone Encounter (Signed)
Pt is coming in 04/14/11 at 11:45 to see TP to discuss medication changes. Offered OV w/ PW in [place] on Monday but refused. Will forward to Dr. Joya Gaskins as an FYI/

## 2011-04-09 NOTE — Telephone Encounter (Signed)
noted 

## 2011-04-09 NOTE — Telephone Encounter (Signed)
Since these are chronic problems he will need to be re-evaluated by Tammy np or Dr Joya Gaskins before changes in meds

## 2011-04-09 NOTE — Telephone Encounter (Signed)
Pt states since starting Symbicort on 01/26/11 he has had increasing leg pain, muscle cramping in his legs, arms and hands. He also had retinal bleed in his right eye and this was treated on Tues., 1/8 by Dr. Baird Cancer. His concern is that he has glaucoma and the Symbicort is interfering with that. Dr. Joya Gaskins is not in the office and we will forward this msg to our doc of the day, Dr. Melvyn Novas for recs. Pls advise. Allergies  Allergen Reactions  . Spiriva Handihaler Other (See Comments)    Dry mouth  . Zocor (Simvastatin)

## 2011-04-14 ENCOUNTER — Encounter: Payer: Self-pay | Admitting: Adult Health

## 2011-04-14 ENCOUNTER — Ambulatory Visit: Payer: Medicare Other | Admitting: Adult Health

## 2011-04-14 ENCOUNTER — Ambulatory Visit (INDEPENDENT_AMBULATORY_CARE_PROVIDER_SITE_OTHER): Payer: Medicare Other | Admitting: Adult Health

## 2011-04-14 DIAGNOSIS — J449 Chronic obstructive pulmonary disease, unspecified: Secondary | ICD-10-CM | POA: Diagnosis not present

## 2011-04-14 DIAGNOSIS — E890 Postprocedural hypothyroidism: Secondary | ICD-10-CM | POA: Diagnosis not present

## 2011-04-14 DIAGNOSIS — E0500 Thyrotoxicosis with diffuse goiter without thyrotoxic crisis or storm: Secondary | ICD-10-CM | POA: Diagnosis not present

## 2011-04-14 DIAGNOSIS — E042 Nontoxic multinodular goiter: Secondary | ICD-10-CM | POA: Diagnosis not present

## 2011-04-14 NOTE — Patient Instructions (Addendum)
May stop Symbicort .  follow up Dr. Joya Gaskins  In 4 weeks and As needed   If you breathing worsens please call office sooner.  follow up with family doctor if joint and muscle aches do not improve

## 2011-04-15 DIAGNOSIS — R350 Frequency of micturition: Secondary | ICD-10-CM | POA: Diagnosis not present

## 2011-04-15 DIAGNOSIS — Z0000 Encounter for general adult medical examination without abnormal findings: Secondary | ICD-10-CM | POA: Diagnosis not present

## 2011-04-15 DIAGNOSIS — I10 Essential (primary) hypertension: Secondary | ICD-10-CM | POA: Diagnosis not present

## 2011-04-15 DIAGNOSIS — E119 Type 2 diabetes mellitus without complications: Secondary | ICD-10-CM | POA: Diagnosis not present

## 2011-04-15 DIAGNOSIS — E785 Hyperlipidemia, unspecified: Secondary | ICD-10-CM | POA: Diagnosis not present

## 2011-04-15 DIAGNOSIS — N189 Chronic kidney disease, unspecified: Secondary | ICD-10-CM | POA: Diagnosis not present

## 2011-04-15 DIAGNOSIS — Z23 Encounter for immunization: Secondary | ICD-10-CM | POA: Diagnosis not present

## 2011-04-15 DIAGNOSIS — Z79899 Other long term (current) drug therapy: Secondary | ICD-10-CM | POA: Diagnosis not present

## 2011-04-15 DIAGNOSIS — Z125 Encounter for screening for malignant neoplasm of prostate: Secondary | ICD-10-CM | POA: Diagnosis not present

## 2011-04-16 NOTE — Progress Notes (Signed)
P Subjective:    Patient ID: Jeffrey Campbell, male    DOB: 07-Nov-1932, 78 y.o.   MRN: [id number]  HPI 78 y.o.AAM pulm HTN on echo.  Saw PCP  ? Heart murmur.  Then Echo done and pulm HTN.  Then Sleep study done, insurance would not pay for Bipap. This therapy not yet started.  Airflow obstruction on PFTs   No smoking in 47yrs.  Since 88 no smoking.  Prior to this was a heavy smoker. No real wheezing.  Pt notes some dyspnea since had heart eval.  Now is noting dyspnea and leg edema.  No real cough.  No real chest tightness.  10/30 Did not get cpap.  Insurance would not approve , notes dry mouth and no change on spiriva. No real cough. Phlegm is thick and sticky.  Mouth real dry.  Awakens with nose irritated.  11/30 Dry mouth better on symbicort vs spiriva.   Dyspnea is ok.  No real chest pain. Mucus is less. >>no changes   04/14/11 Work in [place]  Pt complains of  pain in back, muscle cramps in hands, neck and legs. Patient states started the time he started taking Symbicort. He has read PI in symbiocrt and is convinced the symbicort is causing his symptoms . Also concerned it may lead to cataracts . Discussed alternative but would like to wait and discuss at return ov with Dr. Joya Gaskins    Past Medical History  Diagnosis Date  . PUD (peptic ulcer disease)   . Hypertension   . Hyperlipemia   . CAD (coronary artery disease)   . Shutdown renal     after cardiac cath  . Organic impotence   . CKD (chronic kidney disease), stage III   . History of ETOH abuse   . Type 2 diabetes, diet controlled   . Pulmonary hypertension   . Hyperthyroidism   . Multiple thyroid nodules   . Upper airway resistance syndrome   . Carotid artery bruit   . Heart murmur, systolic   . OSA (obstructive sleep apnea)   . Glaucoma   . BPH (benign prostatic hyperplasia)      Family History  Problem Relation Age of Onset  . Kidney failure Father   . Hypertension Father   . Colon cancer Mother   . Rectal cancer  Sister   . Colon cancer Brother   . Lung disease Brother      History   Social History  . Marital Status: Married    Spouse Name: N/A    Number of Children: 4  . Years of Education: N/A   Occupational History  . Retired     [employer]   Social History Main Topics  . Smoking status: Former Smoker -- 2.0 packs/day for 40 years    Types: Cigarettes    Quit date: 03/29/1984  . Smokeless tobacco: Never Used  . Alcohol Use: No     quit in 1981  . Drug Use: No  . Sexually Active: Not on file   Other Topics Concern  . Not on file   Social History Narrative  . No narrative on file     Allergies  Allergen Reactions  . Spiriva Handihaler Other (See Comments)    Dry mouth  . Zocor (Simvastatin)      Outpatient Prescriptions Prior to Visit  Medication Sig Dispense Refill  . amlodipine-atorvastatin (CADUET) 10-20 MG per tablet Once daily      . aspirin 81 MG tablet  Take 81 mg by mouth daily.        . cloNIDine (CATAPRES) 0.1 MG tablet Take 0.5 tablets by mouth Twice daily.      . furosemide (LASIX) 40 MG tablet 1/2 tablet as needed if home weight . 155 or onset of shortness of breath      . losartan (COZAAR) 100 MG tablet 1/2 tablet twice daily      . Multiple Vitamin (MULTIVITAMIN) tablet Take 1 tablet by mouth daily.        . nitroGLYCERIN (NITROSTAT) 0.4 MG SL tablet Place 0.4 mg under the tongue every 5 (five) minutes as needed.        . TRAVATAN Z 0.004 % ophthalmic solution Daily at bedtime in each eye      . WELCHOL 625 MG tablet 3 tablets Once daily      . budesonide-formoterol (SYMBICORT) 160-4.5 MCG/ACT inhaler Inhale 2 puffs into the lungs 2 (two) times daily.  1 Inhaler  2  . levothyroxine (SYNTHROID, LEVOTHROID) 50 MCG tablet Take 1 tablet by mouth Every morning.          Review of Systems  Constitutional:   No  weight loss, night sweats,  Fevers, chills,   HEENT:   No headaches,  Difficulty swallowing,  Tooth/dental problems, or  Sore throat,                 No sneezing, itching, ear ache, nasal congestion, post nasal drip,   CV:  No chest pain,  Orthopnea, PND, swelling in lower extremities, anasarca, dizziness, palpitations, syncope.   GI  No heartburn, indigestion, abdominal pain, nausea, vomiting, diarrhea, change in bowel habits, loss of appetite, bloody stools.   Resp:   No excess mucus, no productive cough,  No non-productive cough,  No coughing up of blood.  No change in color of mucus.  No wheezing.  No chest wall deformity  Skin: no rash or lesions.  GU: no dysuria, change in color of urine, no urgency or frequency.  No flank pain, no hematuria   MS:  No joint pain or swelling.  No decreased range of motion.     Psych:  No change in mood or affect. No depression or anxiety.  No memory loss.          Objective:   Physical Exam  Filed Vitals:   02/26/11 0903  BP: 150/70  Pulse: 49  Temp: 98 F (36.7 C)  TempSrc: Oral  Height: 5' 9.5" (1.765 m)  Weight: 174 lb (78.926 kg)  SpO2: 97%    Gen: Pleasant, well-nourished, in no distress,  normal affect  ENT: No lesions,  mouth clear,  oropharynx clear, no postnasal drip  Neck: No JVD, no TMG, no carotid bruits  Lungs: No use of accessory muscles, no dullness to percussion, distant BS  Cardiovascular: RRR, heart sounds normal, no murmur or gallops, no peripheral edema  Abdomen: soft and NT, no HSM,  BS normal  Musculoskeletal: No deformities, no cyanosis or clubbing  Neuro: alert, non focal  Skin: Warm, no lesions or rashes         Assessment & Plan:         Updated Medication List Outpatient Encounter Prescriptions as of 02/26/2011  Medication Sig Dispense Refill  . amlodipine-atorvastatin (CADUET) 10-20 MG per tablet Once daily      . aspirin 325 MG tablet Take 325 mg by mouth daily.        Marland Kitchen aspirin 81 MG tablet Take  81 mg by mouth daily.        . budesonide-formoterol (SYMBICORT) 160-4.5 MCG/ACT inhaler Inhale 2 puffs into the lungs 2  (two) times daily.  3 Inhaler  4  . cloNIDine (CATAPRES) 0.1 MG tablet Take 0.5 tablets by mouth Twice daily.      . furosemide (LASIX) 40 MG tablet 1/2 tablet as needed if home weight . 155 or onset of shortness of breath      . levothyroxine (SYNTHROID, LEVOTHROID) 75 MCG tablet Take 75 mcg by mouth daily.        Marland Kitchen losartan (COZAAR) 100 MG tablet 1/2 tablet twice daily      . Multiple Vitamin (MULTIVITAMIN) tablet Take 1 tablet by mouth daily.        . nitroGLYCERIN (NITROSTAT) 0.4 MG SL tablet Place 0.4 mg under the tongue every 5 (five) minutes as needed.        . TRAVATAN Z 0.004 % ophthalmic solution Daily at bedtime in each eye      . WELCHOL 625 MG tablet 3 tablets Once daily      . DISCONTD: budesonide-formoterol (SYMBICORT) 160-4.5 MCG/ACT inhaler Inhale 2 puffs into the lungs 2 (two) times daily.  1 Inhaler  2  . DISCONTD: levothyroxine (SYNTHROID, LEVOTHROID) 50 MCG tablet Take 1 tablet by mouth Every morning.      Marland Kitchen DISCONTD: SPIRIVA HANDIHALER 18 MCG inhalation capsule

## 2011-04-16 NOTE — Assessment & Plan Note (Signed)
Intolerant to inhalers, have recommended trial of different inhaler or nebs however he declines  Have suggested that dobut symbicort is causing his symptoms however he convinced this is the reason.   Plan:  May stop Symbicort .  follow up Dr. Joya Gaskins  In 4 weeks and As needed   If you breathing worsens please call office sooner.  follow up with family doctor if joint and muscle aches do not improve

## 2011-05-06 DIAGNOSIS — E785 Hyperlipidemia, unspecified: Secondary | ICD-10-CM | POA: Diagnosis not present

## 2011-05-06 DIAGNOSIS — Z79899 Other long term (current) drug therapy: Secondary | ICD-10-CM | POA: Diagnosis not present

## 2011-05-10 DIAGNOSIS — E785 Hyperlipidemia, unspecified: Secondary | ICD-10-CM | POA: Diagnosis not present

## 2011-05-10 DIAGNOSIS — I2510 Atherosclerotic heart disease of native coronary artery without angina pectoris: Secondary | ICD-10-CM | POA: Diagnosis not present

## 2011-05-10 DIAGNOSIS — Z79899 Other long term (current) drug therapy: Secondary | ICD-10-CM | POA: Diagnosis not present

## 2011-05-10 DIAGNOSIS — I10 Essential (primary) hypertension: Secondary | ICD-10-CM | POA: Diagnosis not present

## 2011-05-12 ENCOUNTER — Encounter: Payer: Self-pay | Admitting: Critical Care Medicine

## 2011-05-12 ENCOUNTER — Ambulatory Visit (INDEPENDENT_AMBULATORY_CARE_PROVIDER_SITE_OTHER): Payer: Medicare Other | Admitting: Critical Care Medicine

## 2011-05-12 DIAGNOSIS — J449 Chronic obstructive pulmonary disease, unspecified: Secondary | ICD-10-CM

## 2011-05-12 NOTE — Progress Notes (Signed)
Subjective:    Patient ID: Jeffrey Campbell, male    DOB: Feb 22, 1933, 78 y.o.   MRN: [id number]  HPI  78 y.o.AAM with Golds III Copd intolerant of all pulm inhalers   05/12/2011 Could not tol symbicort d/t muscle aches.  Now gone off symbicort.  This resulted in resolution of muscle aches. Pt is not interested in pursuing other pulm med Rx. Pt denies any significant sore throat, nasal congestion or excess secretions, fever, chills, sweats, unintended weight loss, pleurtic or exertional chest pain, orthopnea PND, or leg swelling Pt denies any increase in rescue therapy over baseline, denies waking up needing it or having any early am or nocturnal exacerbations of coughing/wheezing/or dyspnea. Pt also denies any obvious fluctuation in symptoms with  weather or environmental change or other alleviating or aggravating factors    Past Medical History  Diagnosis Date  . PUD (peptic ulcer disease)   . Hypertension   . Hyperlipemia   . CAD (coronary artery disease)   . Shutdown renal     after cardiac cath  . Organic impotence   . CKD (chronic kidney disease), stage III   . History of ETOH abuse   . Type 2 diabetes, diet controlled   . Pulmonary hypertension   . Hyperthyroidism   . Multiple thyroid nodules   . Upper airway resistance syndrome   . Carotid artery bruit   . Heart murmur, systolic   . OSA (obstructive sleep apnea)   . Glaucoma   . BPH (benign prostatic hyperplasia)      Family History  Problem Relation Age of Onset  . Kidney failure Father   . Hypertension Father   . Colon cancer Mother   . Rectal cancer Sister   . Colon cancer Brother   . Lung disease Brother      History   Social History  . Marital Status: Married    Spouse Name: N/A    Number of Children: 4  . Years of Education: N/A   Occupational History  . Retired     [employer]   Social History Main Topics  . Smoking status: Former Smoker -- 2.0 packs/day for 40 years    Types: Cigarettes     Quit date: 03/29/1984  . Smokeless tobacco: Never Used  . Alcohol Use: No     quit in 1981  . Drug Use: No  . Sexually Active: Not on file   Other Topics Concern  . Not on file   Social History Narrative  . No narrative on file     Allergies  Allergen Reactions  . Spiriva Handihaler Other (See Comments)    Dry mouth  . Symbicort     Pain in muscles, cramps in hands and legs  . Zocor (Simvastatin)      Outpatient Prescriptions Prior to Visit  Medication Sig Dispense Refill  . amlodipine-atorvastatin (CADUET) 10-20 MG per tablet Once daily      . aspirin 325 MG tablet Take 325 mg by mouth daily.        . cloNIDine (CATAPRES) 0.1 MG tablet Take 0.5 tablets by mouth Twice daily.      . furosemide (LASIX) 40 MG tablet 1/2 tablet as needed if home weight . 155 or onset of shortness of breath      . losartan (COZAAR) 100 MG tablet 1/2 tablet twice daily      . Multiple Vitamin (MULTIVITAMIN) tablet Take 1 tablet by mouth daily.        Marland Kitchen  nitroGLYCERIN (NITROSTAT) 0.4 MG SL tablet Place 0.4 mg under the tongue every 5 (five) minutes as needed.        . TRAVATAN Z 0.004 % ophthalmic solution Daily at bedtime in each eye      . WELCHOL 625 MG tablet 6 tablets Once daily      . budesonide-formoterol (SYMBICORT) 160-4.5 MCG/ACT inhaler Inhale 2 puffs into the lungs 2 (two) times daily.  3 Inhaler  4  . levothyroxine (SYNTHROID, LEVOTHROID) 75 MCG tablet Take 75 mcg by mouth daily.           Review of Systems Constitutional:   No  weight loss, night sweats,  Fevers, chills, fatigue, lassitude. HEENT:   No headaches,  Difficulty swallowing,  Tooth/dental problems,  Sore throat,                No sneezing, itching, ear ache, nasal congestion, post nasal drip,   CV:  No chest pain,  Orthopnea, PND, swelling in lower extremities, anasarca, dizziness, palpitations  GI  No heartburn, indigestion, abdominal pain, nausea, vomiting, diarrhea, change in bowel habits, loss of  appetite  Resp: Notes shortness of breath with exertion not  at rest.  No excess mucus, no productive cough,  No non-productive cough,  No coughing up of blood.  No change in color of mucus.  No wheezing.  No chest wall deformity  Skin: no rash or lesions.  GU: no dysuria, change in color of urine, no urgency or frequency.  No flank pain.  MS:  No joint pain or swelling.  No decreased range of motion.  No back pain.  Psych:  No change in mood or affect. No depression or anxiety.  No memory loss.     Objective:   Physical Exam  Filed Vitals:   05/12/11 1010  BP: 162/78  Pulse: 49  Temp: 98.5 F (36.9 C)  TempSrc: Oral  Height: 5\' 9"  (1.753 m)  Weight: 176 lb 3.2 oz (79.924 kg)  SpO2: 100%    Gen: Pleasant, well-nourished, in no distress,  normal affect  ENT: No lesions,  mouth clear,  oropharynx clear, no postnasal drip  Neck: No JVD, no TMG, no carotid bruits  Lungs: No use of accessory muscles, no dullness to percussion, distant BS  Cardiovascular: RRR, heart sounds normal, no murmur or gallops, no peripheral edema  Abdomen: soft and NT, no HSM,  BS normal  Musculoskeletal: No deformities, no cyanosis or clubbing  Neuro: alert, non focal  Skin: Warm, no lesions or rashes  No results found.       Assessment & Plan:   COPD (chronic obstructive pulmonary disease) Intolerant to inhalers, side effects to spiriva and symbicort.  Pulm function in moderate category but pt focused on side effects of inhalers and states his dyspnea is tolerable Therefore: Plan:  D/c all pulmonary inhalers for now Follow clinically Return to pulmonary prn      Updated Medication List Outpatient Encounter Prescriptions as of 05/12/2011  Medication Sig Dispense Refill  . amlodipine-atorvastatin (CADUET) 10-20 MG per tablet Once daily      . aspirin 325 MG tablet Take 325 mg by mouth daily.        . cloNIDine (CATAPRES) 0.1 MG tablet Take 0.5 tablets by mouth Twice daily.       . furosemide (LASIX) 40 MG tablet 1/2 tablet as needed if home weight . 155 or onset of shortness of breath      . levothyroxine (SYNTHROID, LEVOTHROID) 100  MCG tablet Take 100 mcg by mouth daily.      Marland Kitchen losartan (COZAAR) 100 MG tablet 1/2 tablet twice daily      . Multiple Vitamin (MULTIVITAMIN) tablet Take 1 tablet by mouth daily.        . nitroGLYCERIN (NITROSTAT) 0.4 MG SL tablet Place 0.4 mg under the tongue every 5 (five) minutes as needed.        . TRAVATAN Z 0.004 % ophthalmic solution Daily at bedtime in each eye      . WELCHOL 625 MG tablet 6 tablets Once daily      . DISCONTD: budesonide-formoterol (SYMBICORT) 160-4.5 MCG/ACT inhaler Inhale 2 puffs into the lungs 2 (two) times daily.  3 Inhaler  4  . DISCONTD: levothyroxine (SYNTHROID, LEVOTHROID) 75 MCG tablet Take 75 mcg by mouth daily.

## 2011-05-12 NOTE — Patient Instructions (Signed)
Ok to stay off inhalers for now Return as needed is symptoms worsen

## 2011-05-14 NOTE — Assessment & Plan Note (Signed)
Intolerant to inhalers, side effects to spiriva and symbicort.  Pulm function in moderate category but pt focused on side effects of inhalers and states his dyspnea is tolerable Therefore: Plan:  D/c all pulmonary inhalers for now Follow clinically Return to pulmonary prn

## 2011-05-27 DIAGNOSIS — I129 Hypertensive chronic kidney disease with stage 1 through stage 4 chronic kidney disease, or unspecified chronic kidney disease: Secondary | ICD-10-CM | POA: Diagnosis not present

## 2011-05-27 DIAGNOSIS — D649 Anemia, unspecified: Secondary | ICD-10-CM | POA: Diagnosis not present

## 2011-05-28 DIAGNOSIS — E890 Postprocedural hypothyroidism: Secondary | ICD-10-CM | POA: Diagnosis not present

## 2011-06-01 DIAGNOSIS — R350 Frequency of micturition: Secondary | ICD-10-CM | POA: Diagnosis not present

## 2011-06-01 DIAGNOSIS — R3915 Urgency of urination: Secondary | ICD-10-CM | POA: Diagnosis not present

## 2011-06-07 DIAGNOSIS — H4011X0 Primary open-angle glaucoma, stage unspecified: Secondary | ICD-10-CM | POA: Diagnosis not present

## 2011-06-07 DIAGNOSIS — H409 Unspecified glaucoma: Secondary | ICD-10-CM | POA: Diagnosis not present

## 2011-06-07 DIAGNOSIS — H2510 Age-related nuclear cataract, unspecified eye: Secondary | ICD-10-CM | POA: Diagnosis not present

## 2011-06-07 DIAGNOSIS — IMO0002 Reserved for concepts with insufficient information to code with codable children: Secondary | ICD-10-CM | POA: Diagnosis not present

## 2011-06-18 ENCOUNTER — Ambulatory Visit (INDEPENDENT_AMBULATORY_CARE_PROVIDER_SITE_OTHER): Payer: Medicare Other | Admitting: Critical Care Medicine

## 2011-06-18 ENCOUNTER — Encounter: Payer: Self-pay | Admitting: Critical Care Medicine

## 2011-06-18 VITALS — BP 130/64 | HR 60 | Temp 98.0°F | Ht 69.0 in | Wt 174.4 lb

## 2011-06-18 DIAGNOSIS — J449 Chronic obstructive pulmonary disease, unspecified: Secondary | ICD-10-CM | POA: Diagnosis not present

## 2011-06-18 NOTE — Patient Instructions (Signed)
No change in medications. Return as needed 

## 2011-06-18 NOTE — Assessment & Plan Note (Signed)
Stable GOLD B  COPD No indication for ongoing inhaled medication use Plan We'll maintain off inhaled medications

## 2011-06-18 NOTE — Progress Notes (Signed)
Subjective:    Patient ID: Jeffrey Campbell, male    DOB: [birth year], 78 y.o.   MRN: [id number]  HPI   78 y.o.AAM with Golds III Copd intolerant of all pulm inhalers     06/18/2011 DOing well off symbicort. No cough. No chest pain.  No edema in feet. Pt denies any significant sore throat, nasal congestion or excess secretions, fever, chills, sweats, unintended weight loss, pleurtic or exertional chest pain, orthopnea PND, or leg swelling Pt denies any increase in rescue therapy over baseline, denies waking up needing it or having any early am or nocturnal exacerbations of coughing/wheezing/or dyspnea. Pt also denies any obvious fluctuation in symptoms with  weather or environmental change or other alleviating or aggravating factors    Past Medical History  Diagnosis Date  . PUD (peptic ulcer disease)   . Hypertension   . Hyperlipemia   . CAD (coronary artery disease)   . Shutdown renal     after cardiac cath  . Organic impotence   . CKD (chronic kidney disease), stage III   . History of ETOH abuse   . Type 2 diabetes, diet controlled   . Pulmonary hypertension   . Hyperthyroidism   . Multiple thyroid nodules   . Upper airway resistance syndrome   . Carotid artery bruit   . Heart murmur, systolic   . OSA (obstructive sleep apnea)   . Glaucoma   . BPH (benign prostatic hyperplasia)      Family History  Problem Relation Age of Onset  . Kidney failure Father   . Hypertension Father   . Colon cancer Mother   . Rectal cancer Sister   . Colon cancer Brother   . Lung disease Brother      History   Social History  . Marital Status: Married    Spouse Name: N/A    Number of Children: 4  . Years of Education: N/A   Occupational History  . Retired     [employer]   Social History Main Topics  . Smoking status: Former Smoker -- 2.0 packs/day for 40 years    Types: Cigarettes    Quit date: 03/29/1984  . Smokeless tobacco: Never Used  . Alcohol Use: No   quit in 1981  . Drug Use: No  . Sexually Active: Not on file   Other Topics Concern  . Not on file   Social History Narrative  . No narrative on file     Allergies  Allergen Reactions  . Spiriva Handihaler Other (See Comments)    Dry mouth  . Symbicort     Pain in muscles, cramps in hands and legs  . Zocor (Simvastatin)      Outpatient Prescriptions Prior to Visit  Medication Sig Dispense Refill  . amlodipine-atorvastatin (CADUET) 10-20 MG per tablet Once daily      . cloNIDine (CATAPRES) 0.1 MG tablet Take 0.5 tablets by mouth Twice daily.      . furosemide (LASIX) 40 MG tablet 1/2 tablet as needed for swelling or onset of shortness of breath      . losartan (COZAAR) 100 MG tablet 1/2 tablet twice daily      . Multiple Vitamin (MULTIVITAMIN) tablet Take 1 tablet by mouth daily.        . nitroGLYCERIN (NITROSTAT) 0.4 MG SL tablet Place 0.4 mg under the tongue every 5 (five) minutes as needed.        . TRAVATAN Z 0.004 % ophthalmic solution Place 1  drop into both eyes at bedtime.       Earnestine Mealing 625 MG tablet 6 tablets Once daily      . aspirin 325 MG tablet Take 325 mg by mouth daily.        Marland Kitchen levothyroxine (SYNTHROID, LEVOTHROID) 100 MCG tablet Take 100 mcg by mouth daily.          Review of Systems  Constitutional:   No  weight loss, night sweats,  Fevers, chills, fatigue, lassitude. HEENT:   No headaches,  Difficulty swallowing,  Tooth/dental problems,  Sore throat,                No sneezing, itching, ear ache, nasal congestion, post nasal drip,   CV:  No chest pain,  Orthopnea, PND, swelling in lower extremities, anasarca, dizziness, palpitations  GI  No heartburn, indigestion, abdominal pain, nausea, vomiting, diarrhea, change in bowel habits, loss of appetite  Resp: Notes shortness of breath with exertion not  at rest.  No excess mucus, no productive cough,  No non-productive cough,  No coughing up of blood.  No change in color of mucus.  No wheezing.  No chest  wall deformity  Skin: no rash or lesions.  GU: no dysuria, change in color of urine, no urgency or frequency.  No flank pain.  MS:  No joint pain or swelling.  No decreased range of motion.  No back pain.  Psych:  No change in mood or affect. No depression or anxiety.  No memory loss.     Objective:   Physical Exam   Filed Vitals:   06/18/11 0907  BP: 130/64  Pulse: 60  Temp: 98 F (36.7 C)  TempSrc: Oral  Height: 5\' 9"  (1.753 m)  Weight: 174 lb 6.4 oz (79.107 kg)  SpO2: 99%    Gen: Pleasant, well-nourished, in no distress,  normal affect  ENT: No lesions,  mouth clear,  oropharynx clear, no postnasal drip  Neck: No JVD, no TMG, no carotid bruits  Lungs: No use of accessory muscles, no dullness to percussion, distant BS  Cardiovascular: RRR, heart sounds normal, no murmur or gallops, no peripheral edema  Abdomen: soft and NT, no HSM,  BS normal  Musculoskeletal: No deformities, no cyanosis or clubbing  Neuro: alert, non focal  Skin: Warm, no lesions or rashes  No results found.       Assessment & Plan:   COPD (chronic obstructive pulmonary disease) Stable GOLD B  COPD No indication for ongoing inhaled medication use Plan We'll maintain off inhaled medications     Updated Medication List Outpatient Encounter Prescriptions as of 06/18/2011  Medication Sig Dispense Refill  . amlodipine-atorvastatin (CADUET) 10-20 MG per tablet Once daily      . aspirin 81 MG tablet Take 81 mg by mouth daily.      . cloNIDine (CATAPRES) 0.1 MG tablet Take 0.5 tablets by mouth Twice daily.      . furosemide (LASIX) 40 MG tablet 1/2 tablet as needed for swelling or onset of shortness of breath      . levothyroxine (SYNTHROID, LEVOTHROID) 125 MCG tablet Take 125 mcg by mouth daily.      Marland Kitchen losartan (COZAAR) 100 MG tablet 1/2 tablet twice daily      . Multiple Vitamin (MULTIVITAMIN) tablet Take 1 tablet by mouth daily.        . nitroGLYCERIN (NITROSTAT) 0.4 MG SL tablet  Place 0.4 mg under the tongue every 5 (five) minutes as needed.        Marland Kitchen  TRAVATAN Z 0.004 % ophthalmic solution Place 1 drop into both eyes at bedtime.       Earnestine Mealing 625 MG tablet 6 tablets Once daily      . DISCONTD: aspirin 325 MG tablet Take 325 mg by mouth daily.        Marland Kitchen DISCONTD: levothyroxine (SYNTHROID, LEVOTHROID) 100 MCG tablet Take 100 mcg by mouth daily.

## 2011-07-06 DIAGNOSIS — H348392 Tributary (branch) retinal vein occlusion, unspecified eye, stable: Secondary | ICD-10-CM | POA: Diagnosis not present

## 2011-07-06 DIAGNOSIS — H4011X0 Primary open-angle glaucoma, stage unspecified: Secondary | ICD-10-CM | POA: Diagnosis not present

## 2011-07-06 DIAGNOSIS — H2510 Age-related nuclear cataract, unspecified eye: Secondary | ICD-10-CM | POA: Diagnosis not present

## 2011-07-16 DIAGNOSIS — E890 Postprocedural hypothyroidism: Secondary | ICD-10-CM | POA: Diagnosis not present

## 2011-07-16 DIAGNOSIS — E119 Type 2 diabetes mellitus without complications: Secondary | ICD-10-CM | POA: Diagnosis not present

## 2011-07-19 DIAGNOSIS — E042 Nontoxic multinodular goiter: Secondary | ICD-10-CM | POA: Diagnosis not present

## 2011-07-19 DIAGNOSIS — E0500 Thyrotoxicosis with diffuse goiter without thyrotoxic crisis or storm: Secondary | ICD-10-CM | POA: Diagnosis not present

## 2011-07-19 DIAGNOSIS — E890 Postprocedural hypothyroidism: Secondary | ICD-10-CM | POA: Diagnosis not present

## 2011-09-27 DIAGNOSIS — H4011X0 Primary open-angle glaucoma, stage unspecified: Secondary | ICD-10-CM | POA: Diagnosis not present

## 2011-09-27 DIAGNOSIS — H409 Unspecified glaucoma: Secondary | ICD-10-CM | POA: Diagnosis not present

## 2011-11-02 DIAGNOSIS — H3560 Retinal hemorrhage, unspecified eye: Secondary | ICD-10-CM | POA: Diagnosis not present

## 2011-11-02 DIAGNOSIS — H348392 Tributary (branch) retinal vein occlusion, unspecified eye, stable: Secondary | ICD-10-CM | POA: Diagnosis not present

## 2011-11-02 DIAGNOSIS — H35059 Retinal neovascularization, unspecified, unspecified eye: Secondary | ICD-10-CM | POA: Diagnosis not present

## 2011-11-02 DIAGNOSIS — H4011X0 Primary open-angle glaucoma, stage unspecified: Secondary | ICD-10-CM | POA: Diagnosis not present

## 2011-11-26 DIAGNOSIS — I2510 Atherosclerotic heart disease of native coronary artery without angina pectoris: Secondary | ICD-10-CM | POA: Diagnosis not present

## 2011-11-26 DIAGNOSIS — I10 Essential (primary) hypertension: Secondary | ICD-10-CM | POA: Diagnosis not present

## 2011-11-26 DIAGNOSIS — I2789 Other specified pulmonary heart diseases: Secondary | ICD-10-CM | POA: Diagnosis not present

## 2011-11-26 DIAGNOSIS — Z79899 Other long term (current) drug therapy: Secondary | ICD-10-CM | POA: Diagnosis not present

## 2011-11-26 DIAGNOSIS — E785 Hyperlipidemia, unspecified: Secondary | ICD-10-CM | POA: Diagnosis not present

## 2011-11-30 DIAGNOSIS — I2510 Atherosclerotic heart disease of native coronary artery without angina pectoris: Secondary | ICD-10-CM | POA: Diagnosis not present

## 2011-11-30 DIAGNOSIS — I2789 Other specified pulmonary heart diseases: Secondary | ICD-10-CM | POA: Diagnosis not present

## 2011-11-30 DIAGNOSIS — Z79899 Other long term (current) drug therapy: Secondary | ICD-10-CM | POA: Diagnosis not present

## 2011-11-30 DIAGNOSIS — E785 Hyperlipidemia, unspecified: Secondary | ICD-10-CM | POA: Diagnosis not present

## 2011-11-30 DIAGNOSIS — I10 Essential (primary) hypertension: Secondary | ICD-10-CM | POA: Diagnosis not present

## 2011-12-03 DIAGNOSIS — R3915 Urgency of urination: Secondary | ICD-10-CM | POA: Diagnosis not present

## 2011-12-03 DIAGNOSIS — N400 Enlarged prostate without lower urinary tract symptoms: Secondary | ICD-10-CM | POA: Diagnosis not present

## 2011-12-21 ENCOUNTER — Encounter: Payer: Self-pay | Admitting: Critical Care Medicine

## 2011-12-21 ENCOUNTER — Ambulatory Visit (INDEPENDENT_AMBULATORY_CARE_PROVIDER_SITE_OTHER): Payer: Medicare Other | Admitting: Critical Care Medicine

## 2011-12-21 VITALS — BP 180/76 | HR 53 | Temp 98.3°F | Ht 69.5 in | Wt 167.4 lb

## 2011-12-21 DIAGNOSIS — G4733 Obstructive sleep apnea (adult) (pediatric): Secondary | ICD-10-CM | POA: Diagnosis not present

## 2011-12-21 DIAGNOSIS — J4489 Other specified chronic obstructive pulmonary disease: Secondary | ICD-10-CM

## 2011-12-21 DIAGNOSIS — J449 Chronic obstructive pulmonary disease, unspecified: Secondary | ICD-10-CM | POA: Diagnosis not present

## 2011-12-21 DIAGNOSIS — I2789 Other specified pulmonary heart diseases: Secondary | ICD-10-CM

## 2011-12-21 DIAGNOSIS — IMO0002 Reserved for concepts with insufficient information to code with codable children: Secondary | ICD-10-CM

## 2011-12-21 NOTE — Progress Notes (Signed)
Subjective:    Patient ID: Jeffrey Campbell, male    DOB: [birth year], 78 y.o.   MRN: [id number]  HPI   78 y.o.AAM with Golds III Copd intolerant of all pulm inhalers  12/21/2011 No changes in dyspnea since last OV.   Dr Radford Pax wanted another OV to reassess pulm status relative to the pulm HTN seen on echo 11/30/11. Pt denies any new pulm complaints. Pt denies any significant sore throat, nasal congestion or excess secretions, fever, chills, sweats, unintended weight loss, pleurtic or exertional chest pain, orthopnea PND, or leg swelling Pt denies any increase in rescue therapy over baseline, denies waking up needing it or having any early am or nocturnal exacerbations of coughing/wheezing/or dyspnea. Pt also denies any obvious fluctuation in symptoms with  weather or environmental change or other alleviating or aggravating factors     Past Medical History  Diagnosis Date  . PUD (peptic ulcer disease)   . Hypertension   . Hyperlipemia   . CAD (coronary artery disease)   . Shutdown renal     after cardiac cath  . Organic impotence   . CKD (chronic kidney disease), stage III   . History of ETOH abuse   . Type 2 diabetes, diet controlled   . Pulmonary hypertension   . Hyperthyroidism   . Multiple thyroid nodules   . Upper airway resistance syndrome   . Carotid artery bruit   . Heart murmur, systolic   . OSA (obstructive sleep apnea)   . Glaucoma   . BPH (benign prostatic hyperplasia)      Family History  Problem Relation Age of Onset  . Kidney failure Father   . Hypertension Father   . Colon cancer Mother   . Rectal cancer Sister   . Colon cancer Brother   . Lung disease Brother      History   Social History  . Marital Status: Married    Spouse Name: N/A    Number of Children: 4  . Years of Education: N/A   Occupational History  . Retired     [employer]   Social History Main Topics  . Smoking status: Former Smoker -- 2.0 packs/day for 40 years    Types:  Cigarettes    Quit date: 03/29/1984  . Smokeless tobacco: Never Used  . Alcohol Use: No     quit in 1981  . Drug Use: No  . Sexually Active: Not on file   Other Topics Concern  . Not on file   Social History Narrative  . No narrative on file     Allergies  Allergen Reactions  . Tiotropium Bromide Monohydrate Other (See Comments)    Dry mouth  . Budesonide-Formoterol Fumarate     Pain in muscles, cramps in hands and legs  . Zocor (Simvastatin)      Outpatient Prescriptions Prior to Visit  Medication Sig Dispense Refill  . amlodipine-atorvastatin (CADUET) 10-20 MG per tablet Once daily      . aspirin 81 MG tablet Take 81 mg by mouth daily.      . cloNIDine (CATAPRES) 0.1 MG tablet Take 0.5 tablets by mouth Twice daily.      . furosemide (LASIX) 40 MG tablet 1/2 tablet as needed for swelling or onset of shortness of breath      . levothyroxine (SYNTHROID, LEVOTHROID) 125 MCG tablet Take 125 mcg by mouth daily.      Marland Kitchen losartan (COZAAR) 100 MG tablet 1/2 tablet twice daily      .  Multiple Vitamin (MULTIVITAMIN) tablet Take 1 tablet by mouth daily.        . nitroGLYCERIN (NITROSTAT) 0.4 MG SL tablet Place 0.4 mg under the tongue every 5 (five) minutes as needed.        . TRAVATAN Z 0.004 % ophthalmic solution Place 1 drop into both eyes at bedtime.       Earnestine Mealing 625 MG tablet 6 tablets Once daily          Review of Systems  Constitutional:   No  weight loss, night sweats,  Fevers, chills, fatigue, lassitude. HEENT:   No headaches,  Difficulty swallowing,  Tooth/dental problems,  Sore throat,                No sneezing, itching, ear ache, nasal congestion, post nasal drip,   CV:  No chest pain,  Orthopnea, PND, swelling in lower extremities, anasarca, dizziness, palpitations  GI  No heartburn, indigestion, abdominal pain, nausea, vomiting, diarrhea, change in bowel habits, loss of appetite  Resp: Notes shortness of breath with exertion not  at rest.  No excess mucus, no  productive cough,  No non-productive cough,  No coughing up of blood.  No change in color of mucus.  No wheezing.  No chest wall deformity  Skin: no rash or lesions.  GU: no dysuria, change in color of urine, no urgency or frequency.  No flank pain.  MS:  No joint pain or swelling.  No decreased range of motion.  No back pain.  Psych:  No change in mood or affect. No depression or anxiety.  No memory loss.     Objective:   Physical Exam   Filed Vitals:   12/21/11 1412  BP: 180/76  Pulse: 53  Temp: 98.3 F (36.8 C)  TempSrc: Oral  Height: 5' 9.5" (1.765 m)  Weight: 75.932 kg (167 lb 6.4 oz)  SpO2: 99%    Gen: Pleasant, well-nourished, in no distress,  normal affect  ENT: No lesions,  mouth clear,  oropharynx clear, no postnasal drip  Neck: No JVD, no TMG, no carotid bruits  Lungs: No use of accessory muscles, no dullness to percussion, distant BS  Cardiovascular: RRR, heart sounds normal, no murmur or gallops, no peripheral edema  Abdomen: soft and NT, no HSM,  BS normal  Musculoskeletal: No deformities, no cyanosis or clubbing  Neuro: alert, non focal  Skin: Warm, no lesions or rashes  No results found.  11/30/11 Echo reviewed.  PAP now lower.  Was 65-78mmHg in 04/2010.  Now is 45-55.  EF 70%     Assessment & Plan:   COPD (chronic obstructive pulmonary disease) Golds B COPD with mod pulm HTN  PAP 45-55 est on echo  D/t diastolic dysfunction EF [id number] CAT 2 12/21/2011 PFTs 05/2010: FeV1 72%  FVC 76%  FeV1/FVC 75%  DLCO 50%  The pulmonary hypertension appears to be on the basis of diastolic dysfunction with EF elevated.  Left atrial pressure elevation There is no primary pulmonary cause of the pts pulmonary hypertension.   COPD is mild and has been nonresponsive to bronchodilators. The sleep apnea is mild and not requiring CPAP  Plan No further pulmonary workup or treatment indicated.   Return to pulmonary prn Pt encouraged to get flu vaccine this fall. He deferred  this vaccine on this visit    Updated Medication List Outpatient Encounter Prescriptions as of 12/21/2011  Medication Sig Dispense Refill  . amlodipine-atorvastatin (CADUET) 10-20 MG per tablet Once daily      .  aspirin 81 MG tablet Take 81 mg by mouth daily.      . cloNIDine (CATAPRES) 0.1 MG tablet Take 0.5 tablets by mouth Twice daily.      . furosemide (LASIX) 40 MG tablet 1/2 tablet as needed for swelling or onset of shortness of breath      . levothyroxine (SYNTHROID, LEVOTHROID) 125 MCG tablet Take 125 mcg by mouth daily.      Marland Kitchen losartan (COZAAR) 100 MG tablet 1/2 tablet twice daily      . Multiple Vitamin (MULTIVITAMIN) tablet Take 1 tablet by mouth daily.        . nitroGLYCERIN (NITROSTAT) 0.4 MG SL tablet Place 0.4 mg under the tongue every 5 (five) minutes as needed.        . TRAVATAN Z 0.004 % ophthalmic solution Place 1 drop into both eyes at bedtime.       Earnestine Mealing 625 MG tablet 6 tablets Once daily

## 2011-12-21 NOTE — Patient Instructions (Signed)
No change in medications. Return as needed 

## 2011-12-22 NOTE — Assessment & Plan Note (Signed)
Golds B COPD with mod pulm HTN  PAP 45-55 est on echo  D/t diastolic dysfunction EF [id number] CAT 2 12/21/2011 PFTs 05/2010: FeV1 72%  FVC 76%  FeV1/FVC 75%  DLCO 50%  The pulmonary hypertension appears to be on the basis of diastolic dysfunction with EF elevated.  Left atrial pressure elevation There is no primary pulmonary cause of the pts pulmonary hypertension.   COPD is mild and has been nonresponsive to bronchodilators. The sleep apnea is mild and not requiring CPAP  Plan No further pulmonary workup or treatment indicated.   Return to pulmonary prn Pt encouraged to get flu vaccine this fall. He deferred this vaccine on this visit

## 2012-01-03 DIAGNOSIS — H4011X0 Primary open-angle glaucoma, stage unspecified: Secondary | ICD-10-CM | POA: Diagnosis not present

## 2012-01-03 DIAGNOSIS — H409 Unspecified glaucoma: Secondary | ICD-10-CM | POA: Diagnosis not present

## 2012-01-03 DIAGNOSIS — IMO0002 Reserved for concepts with insufficient information to code with codable children: Secondary | ICD-10-CM | POA: Diagnosis not present

## 2012-01-03 DIAGNOSIS — H2510 Age-related nuclear cataract, unspecified eye: Secondary | ICD-10-CM | POA: Diagnosis not present

## 2012-01-12 DIAGNOSIS — E890 Postprocedural hypothyroidism: Secondary | ICD-10-CM | POA: Diagnosis not present

## 2012-01-14 DIAGNOSIS — E0500 Thyrotoxicosis with diffuse goiter without thyrotoxic crisis or storm: Secondary | ICD-10-CM | POA: Diagnosis not present

## 2012-01-14 DIAGNOSIS — E042 Nontoxic multinodular goiter: Secondary | ICD-10-CM | POA: Diagnosis not present

## 2012-01-14 DIAGNOSIS — E890 Postprocedural hypothyroidism: Secondary | ICD-10-CM | POA: Diagnosis not present

## 2012-01-19 DIAGNOSIS — H2510 Age-related nuclear cataract, unspecified eye: Secondary | ICD-10-CM | POA: Diagnosis not present

## 2012-01-26 DIAGNOSIS — H2510 Age-related nuclear cataract, unspecified eye: Secondary | ICD-10-CM | POA: Diagnosis not present

## 2012-04-17 DIAGNOSIS — Z23 Encounter for immunization: Secondary | ICD-10-CM | POA: Diagnosis not present

## 2012-04-24 DIAGNOSIS — E785 Hyperlipidemia, unspecified: Secondary | ICD-10-CM | POA: Diagnosis not present

## 2012-04-24 DIAGNOSIS — Z0000 Encounter for general adult medical examination without abnormal findings: Secondary | ICD-10-CM | POA: Diagnosis not present

## 2012-04-24 DIAGNOSIS — Z800 Family history of malignant neoplasm of digestive organs: Secondary | ICD-10-CM | POA: Diagnosis not present

## 2012-04-24 DIAGNOSIS — N189 Chronic kidney disease, unspecified: Secondary | ICD-10-CM | POA: Diagnosis not present

## 2012-04-24 DIAGNOSIS — Z1331 Encounter for screening for depression: Secondary | ICD-10-CM | POA: Diagnosis not present

## 2012-04-24 DIAGNOSIS — Z79899 Other long term (current) drug therapy: Secondary | ICD-10-CM | POA: Diagnosis not present

## 2012-04-24 DIAGNOSIS — I10 Essential (primary) hypertension: Secondary | ICD-10-CM | POA: Diagnosis not present

## 2012-04-24 DIAGNOSIS — E119 Type 2 diabetes mellitus without complications: Secondary | ICD-10-CM | POA: Diagnosis not present

## 2012-05-19 DIAGNOSIS — D649 Anemia, unspecified: Secondary | ICD-10-CM | POA: Diagnosis not present

## 2012-05-22 DIAGNOSIS — K5730 Diverticulosis of large intestine without perforation or abscess without bleeding: Secondary | ICD-10-CM | POA: Diagnosis not present

## 2012-05-26 DIAGNOSIS — Z79899 Other long term (current) drug therapy: Secondary | ICD-10-CM | POA: Diagnosis not present

## 2012-05-29 DIAGNOSIS — H35359 Cystoid macular degeneration, unspecified eye: Secondary | ICD-10-CM | POA: Diagnosis not present

## 2012-05-31 DIAGNOSIS — H348392 Tributary (branch) retinal vein occlusion, unspecified eye, stable: Secondary | ICD-10-CM | POA: Diagnosis not present

## 2012-06-20 DIAGNOSIS — H35359 Cystoid macular degeneration, unspecified eye: Secondary | ICD-10-CM | POA: Diagnosis not present

## 2012-07-18 DIAGNOSIS — E890 Postprocedural hypothyroidism: Secondary | ICD-10-CM | POA: Diagnosis not present

## 2012-07-20 DIAGNOSIS — E042 Nontoxic multinodular goiter: Secondary | ICD-10-CM | POA: Diagnosis not present

## 2012-07-20 DIAGNOSIS — E0500 Thyrotoxicosis with diffuse goiter without thyrotoxic crisis or storm: Secondary | ICD-10-CM | POA: Diagnosis not present

## 2012-07-20 DIAGNOSIS — E890 Postprocedural hypothyroidism: Secondary | ICD-10-CM | POA: Diagnosis not present

## 2012-09-19 DIAGNOSIS — E890 Postprocedural hypothyroidism: Secondary | ICD-10-CM | POA: Diagnosis not present

## 2012-10-12 DIAGNOSIS — H35359 Cystoid macular degeneration, unspecified eye: Secondary | ICD-10-CM | POA: Diagnosis not present

## 2012-10-12 DIAGNOSIS — H35049 Retinal micro-aneurysms, unspecified, unspecified eye: Secondary | ICD-10-CM | POA: Diagnosis not present

## 2012-10-12 DIAGNOSIS — H348392 Tributary (branch) retinal vein occlusion, unspecified eye, stable: Secondary | ICD-10-CM | POA: Diagnosis not present

## 2012-11-27 DIAGNOSIS — I341 Nonrheumatic mitral (valve) prolapse: Secondary | ICD-10-CM

## 2012-11-27 HISTORY — DX: Nonrheumatic mitral (valve) prolapse: I34.1

## 2012-11-29 DIAGNOSIS — E785 Hyperlipidemia, unspecified: Secondary | ICD-10-CM | POA: Diagnosis not present

## 2012-11-29 DIAGNOSIS — I059 Rheumatic mitral valve disease, unspecified: Secondary | ICD-10-CM | POA: Diagnosis not present

## 2012-11-29 DIAGNOSIS — I498 Other specified cardiac arrhythmias: Secondary | ICD-10-CM | POA: Diagnosis not present

## 2012-11-29 DIAGNOSIS — I2789 Other specified pulmonary heart diseases: Secondary | ICD-10-CM | POA: Diagnosis not present

## 2012-11-29 DIAGNOSIS — I10 Essential (primary) hypertension: Secondary | ICD-10-CM | POA: Diagnosis not present

## 2012-11-29 DIAGNOSIS — I2510 Atherosclerotic heart disease of native coronary artery without angina pectoris: Secondary | ICD-10-CM | POA: Diagnosis not present

## 2012-12-04 ENCOUNTER — Emergency Department (HOSPITAL_COMMUNITY)
Admission: EM | Admit: 2012-12-04 | Discharge: 2012-12-04 | Disposition: A | Discharge status: Home / Self Care | Payer: Medicare Other | Attending: Emergency Medicine | Admitting: Emergency Medicine

## 2012-12-04 ENCOUNTER — Encounter (HOSPITAL_COMMUNITY): Payer: Self-pay | Admitting: Nurse Practitioner

## 2012-12-04 ENCOUNTER — Emergency Department (HOSPITAL_COMMUNITY): Payer: Medicare Other

## 2012-12-04 DIAGNOSIS — Z8669 Personal history of other diseases of the nervous system and sense organs: Secondary | ICD-10-CM | POA: Insufficient documentation

## 2012-12-04 DIAGNOSIS — J90 Pleural effusion, not elsewhere classified: Secondary | ICD-10-CM | POA: Diagnosis not present

## 2012-12-04 DIAGNOSIS — Z9889 Other specified postprocedural states: Secondary | ICD-10-CM | POA: Diagnosis not present

## 2012-12-04 DIAGNOSIS — I129 Hypertensive chronic kidney disease with stage 1 through stage 4 chronic kidney disease, or unspecified chronic kidney disease: Secondary | ICD-10-CM | POA: Diagnosis not present

## 2012-12-04 DIAGNOSIS — Z8711 Personal history of peptic ulcer disease: Secondary | ICD-10-CM | POA: Insufficient documentation

## 2012-12-04 DIAGNOSIS — R011 Cardiac murmur, unspecified: Secondary | ICD-10-CM | POA: Diagnosis not present

## 2012-12-04 DIAGNOSIS — E119 Type 2 diabetes mellitus without complications: Secondary | ICD-10-CM | POA: Diagnosis not present

## 2012-12-04 DIAGNOSIS — I10 Essential (primary) hypertension: Secondary | ICD-10-CM | POA: Diagnosis not present

## 2012-12-04 DIAGNOSIS — E0590 Thyrotoxicosis, unspecified without thyrotoxic crisis or storm: Secondary | ICD-10-CM | POA: Insufficient documentation

## 2012-12-04 DIAGNOSIS — Z79899 Other long term (current) drug therapy: Secondary | ICD-10-CM | POA: Insufficient documentation

## 2012-12-04 DIAGNOSIS — Z7982 Long term (current) use of aspirin: Secondary | ICD-10-CM | POA: Diagnosis not present

## 2012-12-04 DIAGNOSIS — R0789 Other chest pain: Secondary | ICD-10-CM | POA: Insufficient documentation

## 2012-12-04 DIAGNOSIS — I2510 Atherosclerotic heart disease of native coronary artery without angina pectoris: Secondary | ICD-10-CM | POA: Insufficient documentation

## 2012-12-04 DIAGNOSIS — N183 Chronic kidney disease, stage 3 unspecified: Secondary | ICD-10-CM | POA: Diagnosis not present

## 2012-12-04 DIAGNOSIS — F1021 Alcohol dependence, in remission: Secondary | ICD-10-CM | POA: Diagnosis not present

## 2012-12-04 DIAGNOSIS — Z87891 Personal history of nicotine dependence: Secondary | ICD-10-CM | POA: Diagnosis not present

## 2012-12-04 DIAGNOSIS — M549 Dorsalgia, unspecified: Secondary | ICD-10-CM | POA: Insufficient documentation

## 2012-12-04 DIAGNOSIS — R911 Solitary pulmonary nodule: Secondary | ICD-10-CM | POA: Diagnosis not present

## 2012-12-04 DIAGNOSIS — M546 Pain in thoracic spine: Secondary | ICD-10-CM | POA: Diagnosis not present

## 2012-12-04 LAB — BASIC METABOLIC PANEL
BUN: 27 mg/dL — ABNORMAL HIGH (ref 6–23)
CO2: 26 mEq/L (ref 19–32)
Chloride: 102 mEq/L (ref 96–112)
Creatinine, Ser: 1.8 mg/dL — ABNORMAL HIGH (ref 0.50–1.35)
Glucose, Bld: 111 mg/dL — ABNORMAL HIGH (ref 70–99)

## 2012-12-04 LAB — CBC
HCT: 35.6 % — ABNORMAL LOW (ref 39.0–52.0)
MCV: 91.8 fL (ref 78.0–100.0)
RBC: 3.88 MIL/uL — ABNORMAL LOW (ref 4.22–5.81)
WBC: 8.2 10*3/uL (ref 4.0–10.5)

## 2012-12-04 MED ORDER — OXYCODONE-ACETAMINOPHEN 5-325 MG PO TABS
1.0000 | ORAL_TABLET | Freq: Once | ORAL | Status: AC
  Administered 2012-12-04: 1 via ORAL
  Filled 2012-12-04: qty 1

## 2012-12-04 MED ORDER — CLONIDINE HCL 0.1 MG PO TABS
0.1000 mg | ORAL_TABLET | Freq: Once | ORAL | Status: AC
  Administered 2012-12-04: 0.1 mg via ORAL
  Filled 2012-12-04: qty 1

## 2012-12-04 MED ORDER — IOHEXOL 350 MG/ML SOLN
100.0000 mL | Freq: Once | INTRAVENOUS | Status: AC | PRN
  Administered 2012-12-04: 100 mL via INTRAVENOUS

## 2012-12-04 MED ORDER — SODIUM CHLORIDE 0.9 % IV BOLUS (SEPSIS)
1000.0000 mL | Freq: Once | INTRAVENOUS | Status: AC
  Administered 2012-12-04: 1000 mL via INTRAVENOUS

## 2012-12-04 MED ORDER — OXYCODONE-ACETAMINOPHEN 5-325 MG PO TABS: 1.0000 | ORAL_TABLET | Freq: Four times a day (QID) | ORAL | Status: DC | PRN

## 2012-12-04 NOTE — ED Notes (Signed)
Family at bedside. Daughter at bedside, she will drive pt home

## 2012-12-04 NOTE — ED Notes (Signed)
C/o generalized chest pain only with coughing & upper back (shoulder blade area) pain only with mvmt x 3-4 days. Presently denies pain anywhere. Denies SOB or prod cough

## 2012-12-04 NOTE — ED Notes (Signed)
Pt alert and ambulatory upon dc, 1 new RX prescribed, pt verbalizes understanding of dc instructions, pt escorted to the exit via wheel chair by [facility] EMT, pt driven home by daughter

## 2012-12-04 NOTE — ED Notes (Signed)
Patient transported to CT 

## 2012-12-04 NOTE — ED Notes (Signed)
[facility] sent pt for r/o pe , dissection after pt came to her today for 3 day history of back pain across both shoulder blades. Pt states he "feels fine" but the pain persists. A&Ox4, breathing easily

## 2012-12-04 NOTE — ED Provider Notes (Signed)
CSN: [id number]     Arrival date & time 12/04/12  1547 History   First MD Initiated Contact with Patient 12/04/12 1600     Chief Complaint  Patient presents with  . Back Pain   (Consider location/radiation/quality/duration/timing/severity/associated sxs/prior Treatment) HPI This is a 79 year old woman with a history of hypertension, hyperlipidemia, coronary artery disease who presents with back pain. Patient presented to his primary care office today. He reports 3 days of back pain. He states that it is in the middle of his back and his across his shoulder blades. He denied any chest pain his primary care doctor but does state he has some chest pain associated with the back pain. He describes as dull. It is currently 5/10. This morning it was 9/10. He took 2 Tylenol. Patient denies any shortness of breath. He states that the pain gets worse with inspiration. He denies any ripping or tearing nature to the pain. He states it has progressively gotten worse over the last 3 days. He denies any fever, headache, abdominal pain, urinary symptoms, focal weakness or numbness. Past Medical History  Diagnosis Date  . PUD (peptic ulcer disease)   . Hypertension   . Hyperlipemia   . CAD (coronary artery disease)   . Shutdown renal     after cardiac cath  . Organic impotence   . CKD (chronic kidney disease), stage III   . History of ETOH abuse   . Type 2 diabetes, diet controlled   . Pulmonary hypertension   . Hyperthyroidism   . Multiple thyroid nodules   . Upper airway resistance syndrome   . Carotid artery bruit   . Heart murmur, systolic   . OSA (obstructive sleep apnea)   . Glaucoma   . BPH (benign prostatic hyperplasia)    Past Surgical History  Procedure Laterality Date  . Appendectomy    . Heart catherization    . Hernia repair  10/2009   Family History  Problem Relation Age of Onset  . Kidney failure Father   . Hypertension Father   . Colon cancer Mother   . Rectal cancer Sister    . Colon cancer Brother   . Lung disease Brother    History  Substance Use Topics  . Smoking status: Former Smoker -- 2.00 packs/day for 40 years    Types: Cigarettes    Quit date: 03/29/1984  . Smokeless tobacco: Never Used  . Alcohol Use: No     Comment: quit in 1981    Review of Systems  Constitutional: Negative.  Negative for fever.  Respiratory: Positive for chest tightness. Negative for shortness of breath.   Cardiovascular: Negative.  Negative for chest pain.  Gastrointestinal: Negative.  Negative for vomiting, abdominal pain and diarrhea.  Genitourinary: Negative.  Negative for dysuria.  Musculoskeletal: Positive for back pain.  Skin: Negative for rash.  Neurological: Negative for weakness and headaches.  Psychiatric/Behavioral: The patient is not nervous/anxious.   All other systems reviewed and are negative.    Allergies  Tiotropium bromide monohydrate; Budesonide-formoterol fumarate; and Zocor  Home Medications   Current Outpatient Rx  Name  Route  Sig  Dispense  Refill  . amlodipine-atorvastatin (CADUET) 10-20 MG per tablet   Oral   Take 1 tablet by mouth daily. Once daily         . aspirin 81 MG tablet   Oral   Take 81 mg by mouth daily.         . furosemide (LASIX) 40 MG tablet  Oral   Take 20 mg by mouth daily as needed. 1/2 tablet as needed for swelling or onset of shortness of breath         . levothyroxine (SYNTHROID, LEVOTHROID) 125 MCG tablet   Oral   Take 125 mcg by mouth daily.         Marland Kitchen losartan (COZAAR) 100 MG tablet   Oral   Take 50 mg by mouth daily. 1/2 tablet twice daily         . Multiple Vitamin (MULTIVITAMIN) tablet   Oral   Take 1 tablet by mouth daily.           . nitroGLYCERIN (NITROSTAT) 0.4 MG SL tablet   Sublingual   Place 0.4 mg under the tongue every 5 (five) minutes as needed for chest pain.          . TRAVATAN Z 0.004 % ophthalmic solution   Both Eyes   Place 1 drop into both eyes at bedtime.           Earnestine Mealing 625 MG tablet   Oral   Take 1,250 mg by mouth 2 (two) times daily with a meal.          . oxyCODONE-acetaminophen (PERCOCET/ROXICET) 5-325 MG per tablet   Oral   Take 1 tablet by mouth every 6 (six) hours as needed for pain.   6 tablet   0    BP 167/68  Pulse 48  Temp(Src) 98.8 F (37.1 C) (Oral)  Resp 17  Ht 5\' 9"  (1.753 m)  Wt 160 lb (72.576 kg)  BMI 23.62 kg/m2  SpO2 99% Physical Exam  Nursing note and vitals reviewed. Constitutional: He is oriented to person, place, and time. He appears well-developed and well-nourished.  HENT:  Head: Normocephalic and atraumatic.  Eyes: Pupils are equal, round, and reactive to light.  Neck: Neck supple.  Cardiovascular: Regular rhythm.   Murmur heard. Bradycardia, 3/5 systolic murmur  Pulmonary/Chest: Effort normal and breath sounds normal. No respiratory distress. He has no wheezes.  Abdominal: Soft. Bowel sounds are normal. There is no tenderness. There is no rebound.  Musculoskeletal: He exhibits no edema.  No midline tenderness to palpation over the back  Lymphadenopathy:    He has no cervical adenopathy.  Neurological: He is alert and oriented to person, place, and time.  Skin: Skin is warm and dry.  Psychiatric: He has a normal mood and affect.    ED Course  Procedures (including critical care time) Labs Review Labs Reviewed  CBC - Abnormal; Notable for the following:    RBC 3.88 (*)    Hemoglobin 12.1 (*)    HCT 35.6 (*)    All other components within normal limits  BASIC METABOLIC PANEL - Abnormal; Notable for the following:    Glucose, Bld 111 (*)    BUN 27 (*)    Creatinine, Ser 1.80 (*)    GFR calc non Af Amer 35 (*)    GFR calc Af Amer 40 (*)    All other components within normal limits  POCT I-STAT TROPONIN I   Imaging Review Ct Angio Chest W/cm &/or Wo Cm  12/04/2012   *RADIOLOGY REPORT*  Clinical Data: Back pain  CT ANGIOGRAPHY CHEST  Technique:  Multidetector CT imaging of the chest  using the standard protocol during bolus administration of intravenous contrast. Multiplanar reconstructed images including MIPs were obtained and reviewed to evaluate the vascular anatomy.  Contrast: 112mL OMNIPAQUE IOHEXOL 350 MG/ML SOLN  Comparison:  None.  Findings: Small pleural effusions are noted right greater than left.  Small nodule in the right lower lobe measures 3 mm, image 51/series 6. No airspace consolidation noted.  Normal heart size.  Calcified atherosclerotic disease affects the LAD and left circumflex coronary artery.  9 mm low right paratracheal lymph node is identified.  No mediastinal or hilar adenopathy is identified.  Main pulmonary artery and its branches are patent.  No filling defects are identified.  There is no axillary or supraclavicular adenopathy identified.  Limited imaging through the upper abdomen is unremarkable.  No acute findings identified.  Review of the visualized bony structures is significant for mild multilevel spondylosis.  No aggressive lytic or sclerotic bone lesions identified.  IMPRESSION:  1.  There are is no evidence for acute pulmonary embolus. 2.  Small pleural effusions. 3.  Prominent coronary artery calcifications. 4.  3 mm right lower lobe nodule is identified. If the patient is at high risk for bronchogenic carcinoma, follow-up chest CT at 1 year is recommended.  If the patient is at low risk, no follow-up is needed.  This recommendation follows the consensus statement: Guidelines for Management of Small Pulmonary Nodules Detected on CT Scans:  A Statement from the [place] as published in Radiology 2005; 237:395-400.   Original Report Authenticated By: Kerby Moors, M.D.    EKG independently reviewed by myself: Normal sinus rhythm with rate of 55, biphasic T waves in the inferior and lateral leads with T-wave inversions in V1, V2.  No prior for comparison MDM   1. Hypertension   2. Back pain   3. Atypical chest pain     This is a  79 year old male who presents from back pain and chest pain for the last 4 days. He is nontoxic-appearing on exams. Initial evaluation is notable for hypertension. Patient was seen here for a while PE versus dissection. Patient denies any ripping or tearing nature to the pain but does endorse a pleuritic chest pain. Patient has a history of renal dysfunction following contrasted imaging. He will receive a low-dose CT scan. Basic labwork was obtained. Creatinine is 1.8. Patient had a normal saline bolus. Patient also got Percocet for his pain. Patient reports improved pain. CT scan is negative for PE. I discussed the CT scan with the radiologist who also did not see any evidence of dissection. Patient does have evidence of calcification. Reexamination the patient, patient reports improvement of pain. Pain is worsened with movement. Of note, patient was noted to be extremely hypertensive with a widened pulse pressure. Blood pressures in the right and left arm correlate. Patient states that he was taken off his clonidine last week and change to another blood pressure medication. She was given 0.1 mg of clonidine with improvement of his blood pressure to 167/68. I stressed to the patient and he needs to followup with his primary care physician for blood pressure recheck and medication evaluation. At this time I feel the patient is safe for discharge home. Patient was also instructed that he needs a repeat CT scan in one year for a pulmonary nodule that was found on CT given his smoking history.  After history, exam, and medical workup I feel the patient has been appropriately medically screened and is safe for discharge home. Pertinent diagnoses were discussed with the patient. Patient was given return precautions.   Merryl Hacker, MD 12/04/12 2056

## 2012-12-04 NOTE — ED Notes (Signed)
ED MD informed & aware of elevated BP

## 2012-12-11 DIAGNOSIS — I10 Essential (primary) hypertension: Secondary | ICD-10-CM | POA: Diagnosis not present

## 2012-12-13 DIAGNOSIS — I10 Essential (primary) hypertension: Secondary | ICD-10-CM | POA: Diagnosis not present

## 2012-12-13 DIAGNOSIS — I498 Other specified cardiac arrhythmias: Secondary | ICD-10-CM | POA: Diagnosis not present

## 2012-12-14 DIAGNOSIS — R5381 Other malaise: Secondary | ICD-10-CM | POA: Diagnosis not present

## 2012-12-14 DIAGNOSIS — R630 Anorexia: Secondary | ICD-10-CM | POA: Diagnosis not present

## 2012-12-14 DIAGNOSIS — R197 Diarrhea, unspecified: Secondary | ICD-10-CM | POA: Diagnosis not present

## 2012-12-14 DIAGNOSIS — R059 Cough, unspecified: Secondary | ICD-10-CM | POA: Diagnosis not present

## 2012-12-14 DIAGNOSIS — B9789 Other viral agents as the cause of diseases classified elsewhere: Secondary | ICD-10-CM | POA: Diagnosis not present

## 2012-12-14 DIAGNOSIS — Z79899 Other long term (current) drug therapy: Secondary | ICD-10-CM | POA: Diagnosis not present

## 2012-12-14 DIAGNOSIS — R110 Nausea: Secondary | ICD-10-CM | POA: Diagnosis not present

## 2012-12-14 DIAGNOSIS — I10 Essential (primary) hypertension: Secondary | ICD-10-CM | POA: Diagnosis not present

## 2012-12-14 DIAGNOSIS — R6883 Chills (without fever): Secondary | ICD-10-CM | POA: Diagnosis not present

## 2012-12-14 DIAGNOSIS — F17200 Nicotine dependence, unspecified, uncomplicated: Secondary | ICD-10-CM | POA: Diagnosis not present

## 2012-12-14 DIAGNOSIS — F411 Generalized anxiety disorder: Secondary | ICD-10-CM | POA: Diagnosis not present

## 2012-12-14 DIAGNOSIS — R42 Dizziness and giddiness: Secondary | ICD-10-CM | POA: Diagnosis not present

## 2012-12-14 DIAGNOSIS — G8929 Other chronic pain: Secondary | ICD-10-CM | POA: Diagnosis not present

## 2012-12-18 DIAGNOSIS — E785 Hyperlipidemia, unspecified: Secondary | ICD-10-CM | POA: Diagnosis not present

## 2012-12-18 DIAGNOSIS — I10 Essential (primary) hypertension: Secondary | ICD-10-CM | POA: Diagnosis not present

## 2012-12-18 DIAGNOSIS — I498 Other specified cardiac arrhythmias: Secondary | ICD-10-CM | POA: Diagnosis not present

## 2012-12-18 DIAGNOSIS — I2510 Atherosclerotic heart disease of native coronary artery without angina pectoris: Secondary | ICD-10-CM | POA: Diagnosis not present

## 2012-12-18 DIAGNOSIS — M549 Dorsalgia, unspecified: Secondary | ICD-10-CM | POA: Diagnosis not present

## 2012-12-18 DIAGNOSIS — I059 Rheumatic mitral valve disease, unspecified: Secondary | ICD-10-CM | POA: Diagnosis not present

## 2012-12-18 DIAGNOSIS — I2789 Other specified pulmonary heart diseases: Secondary | ICD-10-CM | POA: Diagnosis not present

## 2013-04-12 DIAGNOSIS — H353190 Nonexudative age-related macular degeneration, unspecified eye, stage unspecified: Secondary | ICD-10-CM | POA: Diagnosis not present

## 2013-04-12 DIAGNOSIS — Z961 Presence of intraocular lens: Secondary | ICD-10-CM | POA: Diagnosis not present

## 2013-04-25 ENCOUNTER — Encounter: Payer: Self-pay | Admitting: Cardiology

## 2013-04-25 DIAGNOSIS — E785 Hyperlipidemia, unspecified: Secondary | ICD-10-CM | POA: Diagnosis not present

## 2013-04-25 DIAGNOSIS — Z0000 Encounter for general adult medical examination without abnormal findings: Secondary | ICD-10-CM | POA: Diagnosis not present

## 2013-04-25 DIAGNOSIS — N189 Chronic kidney disease, unspecified: Secondary | ICD-10-CM | POA: Diagnosis not present

## 2013-04-25 DIAGNOSIS — E0500 Thyrotoxicosis with diffuse goiter without thyrotoxic crisis or storm: Secondary | ICD-10-CM | POA: Diagnosis not present

## 2013-04-25 DIAGNOSIS — Z1331 Encounter for screening for depression: Secondary | ICD-10-CM | POA: Diagnosis not present

## 2013-04-25 DIAGNOSIS — I2510 Atherosclerotic heart disease of native coronary artery without angina pectoris: Secondary | ICD-10-CM | POA: Diagnosis not present

## 2013-04-25 DIAGNOSIS — J449 Chronic obstructive pulmonary disease, unspecified: Secondary | ICD-10-CM | POA: Diagnosis not present

## 2013-04-25 DIAGNOSIS — I10 Essential (primary) hypertension: Secondary | ICD-10-CM | POA: Diagnosis not present

## 2013-04-25 DIAGNOSIS — Z23 Encounter for immunization: Secondary | ICD-10-CM | POA: Diagnosis not present

## 2013-04-25 DIAGNOSIS — E119 Type 2 diabetes mellitus without complications: Secondary | ICD-10-CM | POA: Diagnosis not present

## 2013-04-26 ENCOUNTER — Other Ambulatory Visit: Payer: Self-pay | Admitting: Family Medicine

## 2013-04-26 DIAGNOSIS — R0989 Other specified symptoms and signs involving the circulatory and respiratory systems: Secondary | ICD-10-CM

## 2013-04-27 ENCOUNTER — Ambulatory Visit
Admission: RE | Admit: 2013-04-27 | Discharge: 2013-04-27 | Disposition: A | Discharge status: Home / Self Care | Payer: Medicare Other | Source: Ambulatory Visit | Attending: Family Medicine | Admitting: Family Medicine

## 2013-04-27 DIAGNOSIS — I658 Occlusion and stenosis of other precerebral arteries: Secondary | ICD-10-CM | POA: Diagnosis not present

## 2013-04-27 DIAGNOSIS — R0989 Other specified symptoms and signs involving the circulatory and respiratory systems: Secondary | ICD-10-CM

## 2013-05-10 DIAGNOSIS — H3560 Retinal hemorrhage, unspecified eye: Secondary | ICD-10-CM | POA: Diagnosis not present

## 2013-05-10 DIAGNOSIS — H35359 Cystoid macular degeneration, unspecified eye: Secondary | ICD-10-CM | POA: Diagnosis not present

## 2013-05-10 DIAGNOSIS — H348392 Tributary (branch) retinal vein occlusion, unspecified eye, stable: Secondary | ICD-10-CM | POA: Diagnosis not present

## 2013-05-10 DIAGNOSIS — H35049 Retinal micro-aneurysms, unspecified, unspecified eye: Secondary | ICD-10-CM | POA: Diagnosis not present

## 2013-05-29 ENCOUNTER — Encounter: Payer: Self-pay | Admitting: General Surgery

## 2013-05-29 DIAGNOSIS — R001 Bradycardia, unspecified: Secondary | ICD-10-CM | POA: Insufficient documentation

## 2013-05-30 ENCOUNTER — Encounter: Payer: Self-pay | Admitting: Cardiology

## 2013-05-30 ENCOUNTER — Ambulatory Visit (INDEPENDENT_AMBULATORY_CARE_PROVIDER_SITE_OTHER): Payer: Medicare Other | Admitting: Cardiology

## 2013-05-30 VITALS — BP 132/58 | HR 49 | Ht 69.0 in | Wt 165.0 lb

## 2013-05-30 DIAGNOSIS — I340 Nonrheumatic mitral (valve) insufficiency: Secondary | ICD-10-CM

## 2013-05-30 DIAGNOSIS — E785 Hyperlipidemia, unspecified: Secondary | ICD-10-CM

## 2013-05-30 DIAGNOSIS — I5189 Other ill-defined heart diseases: Secondary | ICD-10-CM | POA: Insufficient documentation

## 2013-05-30 DIAGNOSIS — I10 Essential (primary) hypertension: Secondary | ICD-10-CM | POA: Diagnosis not present

## 2013-05-30 DIAGNOSIS — I059 Rheumatic mitral valve disease, unspecified: Secondary | ICD-10-CM

## 2013-05-30 DIAGNOSIS — R001 Bradycardia, unspecified: Secondary | ICD-10-CM

## 2013-05-30 DIAGNOSIS — I2789 Other specified pulmonary heart diseases: Secondary | ICD-10-CM

## 2013-05-30 DIAGNOSIS — I498 Other specified cardiac arrhythmias: Secondary | ICD-10-CM

## 2013-05-30 DIAGNOSIS — I2510 Atherosclerotic heart disease of native coronary artery without angina pectoris: Secondary | ICD-10-CM

## 2013-05-30 DIAGNOSIS — I272 Pulmonary hypertension, unspecified: Secondary | ICD-10-CM

## 2013-05-30 DIAGNOSIS — I341 Nonrheumatic mitral (valve) prolapse: Secondary | ICD-10-CM

## 2013-05-30 NOTE — Progress Notes (Signed)
[place], [place], [place]  [address] Phone: [phone] Fax:  [phone]  Date:  05/30/2013   ID:  Jeffrey Campbell, DOB [birth year], MRN [id number]  PCP:  Gerrit Heck, MD  Cardiologist:  Fransico Him, MD     History of Present Illness: Jeffrey Campbell is a 80 y.o. male with a history of ASCAD/HTN/dyslipidemia/mild to mod MR with posterior MVP/bradycardia and mild pulmonary HTN presents today for followup.  Since I saw him last he was having some problems with itching and was worried that he was allergic to one of his meds but only on his legs but that has resolved.  He denies any chest pain, SOB, DOE, LE edema, palpitations or syncope.  Since starting the doxazosin and HCTZ he will get dizziness occasionally but no syncope.   Wt Readings from Last 3 Encounters:  05/30/13 165 lb (74.844 kg)  12/04/12 160 lb (72.576 kg)  12/21/11 167 lb 6.4 oz (75.932 kg)     Past Medical History  Diagnosis Date  . PUD (peptic ulcer disease)   . Hypertension   . Hyperlipemia   . CAD (coronary artery disease)   . Shutdown renal     after cardiac cath  . Organic impotence   . CKD (chronic kidney disease), stage III   . History of ETOH abuse   . Type 2 diabetes, diet controlled   . Pulmonary hypertension   . Upper airway resistance syndrome   . Carotid artery bruit 03/2010    left--w less then 50% bilateral cardotid artery stenosis by doppler   . OSA (obstructive sleep apnea)     upper airway resistance syndrome with RDI 18/hr - not on CPAP due to insurance not covering  . Glaucoma   . BPH (benign prostatic hyperplasia)   . Hyperthyroidism 08/26/10    radioactive iodine therapy   . Multiple thyroid nodules   . Graves disease   . Heart murmur, systolic     w mild to mod MR, mild AR/PR/TR echo 2012  . History of PFTs 05/2010    moderate airflow obstruction w reduced DLCO by PFT   . Mitral regurgitation echo 2012    mild to moderate  . MVP (mitral valve prolapse)  11/2012    posterior MVP  . Diastolic dysfunction     Current Outpatient Prescriptions  Medication Sig Dispense Refill  . amlodipine-atorvastatin (CADUET) 10-20 MG per tablet Take 1 tablet by mouth daily. Once daily      . aspirin 81 MG tablet Take 81 mg by mouth daily.      Marland Kitchen doxazosin (CARDURA) 1 MG tablet Take 1 mg by mouth 2 (two) times daily.      . furosemide (LASIX) 40 MG tablet Take 20 mg by mouth daily as needed. 1/2 tablet as needed for swelling or onset of shortness of breath      . hydrochlorothiazide (HYDRODIURIL) 25 MG tablet Take 25 mg by mouth daily.      Marland Kitchen levothyroxine (SYNTHROID, LEVOTHROID) 100 MCG tablet Take 100 mcg by mouth daily before breakfast.      . losartan (COZAAR) 100 MG tablet Take 50 mg by mouth daily. 1/2 tablet twice daily      . Multiple Vitamin (MULTIVITAMIN) tablet Take 1 tablet by mouth daily.        . nitroGLYCERIN (NITROSTAT) 0.4 MG SL tablet Place 0.4 mg under the tongue every 5 (five) minutes as needed for chest pain.       Marland Kitchen  oxyCODONE-acetaminophen (PERCOCET/ROXICET) 5-325 MG per tablet Take 1 tablet by mouth every 6 (six) hours as needed for pain.  6 tablet  0  . TRAVATAN Z 0.004 % ophthalmic solution Place 1 drop into both eyes at bedtime.       Jeffrey Campbell 625 MG tablet Take 3,750 mg by mouth daily with breakfast.        No current facility-administered medications for this visit.    Allergies:    Allergies  Allergen Reactions  . Tiotropium Bromide Monohydrate Other (See Comments)    Dry mouth  . Budesonide-Formoterol Fumarate     Pain in muscles, cramps in hands and legs  . Other     DYE- Causes kidney to shut down  . Tricor [Fenofibrate]     Unknown   . Zocor [Simvastatin]     Liver problems     Social History:  The patient  reports that he quit smoking about 29 years ago. His smoking use included Cigarettes. He has a 80 pack-year smoking history. He has never used smokeless tobacco. He reports that he does not drink alcohol or use  illicit drugs.   Family History:  The patient's family history includes Colon cancer in his brother, maternal uncle, and mother; Hypertension in his father; Kidney failure in his father; Lung disease in his brother.   ROS:  Please see the history of present illness.      All other systems reviewed and negative.   PHYSICAL EXAM: VS:  BP 132/58  Pulse 49  Ht 5\' 9"  (1.753 m)  Wt 165 lb (74.844 kg)  BMI 24.36 kg/m2  SpO2 99% Well nourished, well developed, in no acute distress HEENT: normal Neck: no JVD Cardiac:  normal S1, S2; RRR; no murmur Lungs:  clear to auscultation bilaterally, no wheezing, rhonchi or rales Abd: soft, nontender, no hepatomegaly Ext: no edema Skin: warm and dry Neuro:  CNs 2-12 intact, no focal abnormalities noted       ASSESSMENT AND PLAN:  1. ASCAD with no angina  - continue ASA 2. Asymptomatic Bradycardia 3. HTN - well controlled  - continue Caduet/doxazosin/Losartan  - stop HCTZ since he is getting some orthostatic dizziness - he will call in a week to let me know if he feels better 4. Mild to moderate MR with posterior leaflet MVP 5. Dyslipidemia - at goal   - continue Caduet   - His LDL is low so I will stop his Welchol at his request and rescheck fasting lipids in 6 weeks 6. Mild pulmonary HTN  Followup with me in 6 months  Signed, Fransico Him, MD 05/30/2013 10:07 AM

## 2013-05-30 NOTE — Patient Instructions (Addendum)
Your physician has recommended you make the following change in your medication: 1. Stop HCTZ. Please call us in 1-2 weeks and let us know if the dizziness has gotten any better with being off the medication 2. Stop Welchol, recheck lipids in 8 weeks.   Your physician recommends that you return for lab work in: 8 Weeks to check a Fasting Lipid and ALT on 07/25/13.  Your physician has requested that you have an echocardiogram. Echocardiography is a painless test that uses sound waves to create images of your heart. It provides your doctor with information about the size and shape of your heart and how well your heart's chambers and valves are working. This procedure takes approximately one hour. There are no restrictions for this procedure. ( Schedule for September 2015)  Your physician wants you to follow-up in: 6 Months with Dr Mallie Snooks will receive a reminder letter in the mail two months in advance. If you don't receive a letter, please call our office to schedule the follow-up appointment.

## 2013-06-04 DIAGNOSIS — R350 Frequency of micturition: Secondary | ICD-10-CM | POA: Diagnosis not present

## 2013-06-04 DIAGNOSIS — N3941 Urge incontinence: Secondary | ICD-10-CM | POA: Diagnosis not present

## 2013-06-04 DIAGNOSIS — N508 Other specified disorders of male genital organs: Secondary | ICD-10-CM | POA: Diagnosis not present

## 2013-06-04 DIAGNOSIS — R351 Nocturia: Secondary | ICD-10-CM | POA: Diagnosis not present

## 2013-06-08 DIAGNOSIS — N183 Chronic kidney disease, stage 3 unspecified: Secondary | ICD-10-CM | POA: Diagnosis not present

## 2013-06-08 DIAGNOSIS — N2581 Secondary hyperparathyroidism of renal origin: Secondary | ICD-10-CM | POA: Diagnosis not present

## 2013-06-15 ENCOUNTER — Other Ambulatory Visit (HOSPITAL_COMMUNITY): Payer: Medicare Other

## 2013-06-18 DIAGNOSIS — E042 Nontoxic multinodular goiter: Secondary | ICD-10-CM | POA: Diagnosis not present

## 2013-07-20 DIAGNOSIS — E0500 Thyrotoxicosis with diffuse goiter without thyrotoxic crisis or storm: Secondary | ICD-10-CM | POA: Diagnosis not present

## 2013-07-20 DIAGNOSIS — E890 Postprocedural hypothyroidism: Secondary | ICD-10-CM | POA: Diagnosis not present

## 2013-07-25 ENCOUNTER — Other Ambulatory Visit (INDEPENDENT_AMBULATORY_CARE_PROVIDER_SITE_OTHER): Payer: Medicare Other

## 2013-07-25 ENCOUNTER — Other Ambulatory Visit: Payer: Medicare Other

## 2013-07-25 DIAGNOSIS — E785 Hyperlipidemia, unspecified: Secondary | ICD-10-CM | POA: Diagnosis not present

## 2013-07-25 LAB — ALT: ALT: 14 U/L (ref 0–53)

## 2013-07-25 LAB — LIPID PANEL
CHOLESTEROL: 127 mg/dL (ref 0–200)
HDL: 59.1 mg/dL (ref >39.00)
LDL Cholesterol: 62 mg/dL (ref 0–99)
Total CHOL/HDL Ratio: 2
Triglycerides: 29 mg/dL (ref 0.0–149.0)
VLDL: 5.8 mg/dL (ref 0.0–40.0)

## 2013-07-27 ENCOUNTER — Encounter: Payer: Self-pay | Admitting: General Surgery

## 2013-07-27 ENCOUNTER — Other Ambulatory Visit: Payer: Self-pay | Admitting: General Surgery

## 2013-07-27 DIAGNOSIS — E785 Hyperlipidemia, unspecified: Secondary | ICD-10-CM

## 2013-07-30 ENCOUNTER — Telehealth: Payer: Self-pay | Admitting: Cardiology

## 2013-07-30 NOTE — Telephone Encounter (Signed)
Patient would like lab results, please call and advise.

## 2013-07-30 NOTE — Telephone Encounter (Signed)
Returned call to patient 07/25/13 lab results given.Advised Dr.Turner's nurse tried to call you,she mailed result letter 07/27/13.Advised to continue same medications.

## 2013-11-21 ENCOUNTER — Other Ambulatory Visit: Payer: Self-pay

## 2013-11-21 MED ORDER — LOSARTAN POTASSIUM 100 MG PO TABS: ORAL_TABLET | ORAL | Status: DC

## 2013-12-04 ENCOUNTER — Encounter: Payer: Self-pay | Admitting: General Surgery

## 2013-12-04 ENCOUNTER — Encounter: Payer: Self-pay | Admitting: Cardiology

## 2013-12-04 ENCOUNTER — Ambulatory Visit (INDEPENDENT_AMBULATORY_CARE_PROVIDER_SITE_OTHER): Payer: Medicare Other | Admitting: Cardiology

## 2013-12-04 VITALS — BP 160/80 | HR 41 | Ht 69.0 in | Wt 171.6 lb

## 2013-12-04 DIAGNOSIS — I498 Other specified cardiac arrhythmias: Secondary | ICD-10-CM

## 2013-12-04 DIAGNOSIS — R001 Bradycardia, unspecified: Secondary | ICD-10-CM

## 2013-12-04 DIAGNOSIS — I059 Rheumatic mitral valve disease, unspecified: Secondary | ICD-10-CM

## 2013-12-04 DIAGNOSIS — IMO0002 Reserved for concepts with insufficient information to code with codable children: Secondary | ICD-10-CM

## 2013-12-04 DIAGNOSIS — E785 Hyperlipidemia, unspecified: Secondary | ICD-10-CM | POA: Diagnosis not present

## 2013-12-04 DIAGNOSIS — I2510 Atherosclerotic heart disease of native coronary artery without angina pectoris: Secondary | ICD-10-CM | POA: Diagnosis not present

## 2013-12-04 DIAGNOSIS — E119 Type 2 diabetes mellitus without complications: Secondary | ICD-10-CM

## 2013-12-04 DIAGNOSIS — I10 Essential (primary) hypertension: Secondary | ICD-10-CM

## 2013-12-04 DIAGNOSIS — I2789 Other specified pulmonary heart diseases: Secondary | ICD-10-CM

## 2013-12-04 DIAGNOSIS — I341 Nonrheumatic mitral (valve) prolapse: Secondary | ICD-10-CM

## 2013-12-04 LAB — HEPATIC FUNCTION PANEL
ALT: 14 U/L (ref 0–53)
AST: 21 U/L (ref 0–37)
Albumin: 4 g/dL (ref 3.5–5.2)
Alkaline Phosphatase: 64 U/L (ref 39–117)
BILIRUBIN TOTAL: 1.6 mg/dL — AB (ref 0.2–1.2)
Bilirubin, Direct: 0.2 mg/dL (ref 0.0–0.3)
TOTAL PROTEIN: 7.2 g/dL (ref 6.0–8.3)

## 2013-12-04 LAB — LIPID PANEL
CHOLESTEROL: 135 mg/dL (ref 0–200)
HDL: 62.8 mg/dL (ref >39.00)
LDL CALC: 65 mg/dL (ref 0–99)
NonHDL: 72.2
TRIGLYCERIDES: 37 mg/dL (ref 0.0–149.0)
Total CHOL/HDL Ratio: 2
VLDL: 7.4 mg/dL (ref 0.0–40.0)

## 2013-12-04 NOTE — Patient Instructions (Signed)
Your physician recommends that you continue on your current medications as directed. Please refer to the Current Medication list given to you today.  Your physician recommends that you have your LIPID and Hepatic panel checked today (orders in EPIC)  Your physician has requested that you have an echocardiogram. Echocardiography is a painless test that uses sound waves to create images of your heart. It provides your doctor with information about the size and shape of your heart and how well your heart's chambers and valves are working. This procedure takes approximately one hour. There are no restrictions for this procedure. ( This is already scheduled for 12/17/13)  Your physician has requested that you have an exercise tolerance test. For further information please visit [email or web address]. Please also follow instruction sheet, as given.  Your physician has recommended that you wear an event monitor. Event monitors are medical devices that record the heart's electrical activity. Doctors most often [place] these monitors to diagnose arrhythmias. Arrhythmias are problems with the speed or rhythm of the heartbeat. The monitor is a small, portable device. You can wear one while you do your normal daily activities. This is usually used to diagnose what is causing palpitations/syncope (passing out).  Your physician wants you to follow-up in: 6 months with Dr Mallie Snooks will receive a reminder letter in the mail two months in advance. If you don't receive a letter, please call our office to schedule the follow-up appointment.

## 2013-12-04 NOTE — Addendum Note (Signed)
Addended by: Eulis Foster on: 12/04/2013 09:27 AM   Modules accepted: Orders

## 2013-12-04 NOTE — Progress Notes (Signed)
[place], [place], [place]  [address] Phone: [phone] Fax:  [phone]  Date:  12/04/2013   ID:  Jeffrey Campbell, DOB 1932-11-16, MRN [id number]  PCP:  Gerrit Heck, MD  Cardiologist:  Fransico Him, MD     History of Present Illness: Jeffrey Campbell is a 80 y.o. male with a history of ASCAD/HTN/dyslipidemia/mild to mod MR with posterior MVP/bradycardia and mild pulmonary HTN presents today for followup.  He denies any chest pain, LE edema, palpitations or syncope. He has chronic DOE which comes and goes and is actually much improved since I saw him last. He still occasionally has some mild drops in his BP and gets dizzy but this is much improved since stopping the HCTZ.    Wt Readings from Last 3 Encounters:  12/04/13 171 lb 9.6 oz (77.837 kg)  05/30/13 165 lb (74.844 kg)  12/04/12 160 lb (72.576 kg)     Past Medical History  Diagnosis Date  . PUD (peptic ulcer disease)   . Hypertension   . Hyperlipemia   . CAD (coronary artery disease)   . Shutdown renal     after cardiac cath  . Organic impotence   . CKD (chronic kidney disease), stage III   . History of ETOH abuse   . Type 2 diabetes, diet controlled   . Pulmonary hypertension   . Upper airway resistance syndrome   . Carotid artery bruit 03/2010    left--w less then 50% bilateral cardotid artery stenosis by doppler   . OSA (obstructive sleep apnea)     upper airway resistance syndrome with RDI 18/hr - not on CPAP due to insurance not covering  . Glaucoma   . BPH (benign prostatic hyperplasia)   . Hyperthyroidism 08/26/10    radioactive iodine therapy   . Multiple thyroid nodules   . Graves disease   . Heart murmur, systolic     w mild to mod MR, mild AR/PR/TR echo 2012  . History of PFTs 05/2010    moderate airflow obstruction w reduced DLCO by PFT   . Mitral regurgitation echo 2012    mild to moderate  . MVP (mitral valve prolapse) 11/2012    posterior MVP  . Diastolic dysfunction      Current Outpatient Prescriptions  Medication Sig Dispense Refill  . amlodipine-atorvastatin (CADUET) 10-20 MG per tablet Take 1 tablet by mouth daily. Once daily      . aspirin 81 MG tablet Take 81 mg by mouth daily.      Marland Kitchen doxazosin (CARDURA) 1 MG tablet Take 1 mg by mouth 2 (two) times daily.      . furosemide (LASIX) 40 MG tablet Take 20 mg by mouth daily as needed. 1/2 tablet as needed for swelling or onset of shortness of breath      . levothyroxine (SYNTHROID, LEVOTHROID) 100 MCG tablet Take 100 mcg by mouth daily before breakfast.      . losartan (COZAAR) 100 MG tablet 1/2 tablet twice daily  30 tablet  3  . Multiple Vitamin (MULTIVITAMIN) tablet Take 1 tablet by mouth daily.        . nitroGLYCERIN (NITROSTAT) 0.4 MG SL tablet Place 0.4 mg under the tongue every 5 (five) minutes as needed for chest pain.       . TRAVATAN Z 0.004 % ophthalmic solution Place 1 drop into both eyes at bedtime.       Marland Kitchen oxyCODONE-acetaminophen (PERCOCET/ROXICET) 5-325 MG per tablet Take 1 tablet  by mouth every 6 (six) hours as needed for pain.  6 tablet  0   No current facility-administered medications for this visit.    Allergies:    Allergies  Allergen Reactions  . Tiotropium Bromide Monohydrate Other (See Comments)    Dry mouth  . Budesonide-Formoterol Fumarate     Pain in muscles, cramps in hands and legs  . Other     DYE- Causes kidney to shut down  . Tricor [Fenofibrate]     Unknown   . Zocor [Simvastatin]     Liver problems     Social History:  The patient  reports that he quit smoking about 29 years ago. His smoking use included Cigarettes. He has a 80 pack-year smoking history. He has never used smokeless tobacco. He reports that he does not drink alcohol or use illicit drugs.   Family History:  The patient's family history includes Colon cancer in his brother, maternal uncle, and mother; Hypertension in his father; Kidney failure in his father; Lung disease in his brother.   ROS:   Please see the history of present illness.      All other systems reviewed and negative.   PHYSICAL EXAM: VS:  BP 160/80  Pulse 41  Ht 5\' 9"  (1.753 m)  Wt 171 lb 9.6 oz (77.837 kg)  BMI 25.33 kg/m2 Well nourished, well developed, in no acute distress HEENT: normal Neck: no JVD Cardiac:  normal S1, S2; RRR; 2/6 SM at RUSB to LLSB Lungs:  clear to auscultation bilaterally, no wheezing, rhonchi or rales Abd: soft, nontender, no hepatomegaly Ext: no edema Skin: warm and dry Neuro:  CNs 2-12 intact, no focal abnormalities noted  EKG:  Sinus bradycardia at 41bpm      ASSESSMENT AND PLAN:  1. ASCAD with no angina - continue ASA  2. Asymptomatic Bradycardia 3. HTN - elevated today  It usually at home it runs 140/39mmHg - continue Caduet/doxazosin/Losartan  4. Mild to moderate MR with posterior leaflet MVP - recheck echo to assess for progression of MR and pulmonary HTN 5. Dyslipidemia - at goal 06/2013 - continue Caduet  - recheck FLP and ALT 6. Mild pulmonary HTN 7. 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 Bradycardia - ? Whether some of his dizziness is related to bradycardia and seems to occur more when exerting himself. - check 30 day event monitor to try to correlate dizzy spells with worsening bradycardia and will also get an ETT to assess for chronotropic response  Followup with me in 6 months   Signed, Fransico Him, MD 12/04/2013 9:07 AM

## 2013-12-05 ENCOUNTER — Encounter: Payer: Self-pay | Admitting: *Deleted

## 2013-12-05 ENCOUNTER — Telehealth: Payer: Self-pay | Admitting: Cardiology

## 2013-12-05 ENCOUNTER — Encounter (INDEPENDENT_AMBULATORY_CARE_PROVIDER_SITE_OTHER): Payer: Medicare Other

## 2013-12-05 DIAGNOSIS — I498 Other specified cardiac arrhythmias: Secondary | ICD-10-CM | POA: Diagnosis not present

## 2013-12-05 DIAGNOSIS — R001 Bradycardia, unspecified: Secondary | ICD-10-CM

## 2013-12-05 DIAGNOSIS — I4589 Other specified conduction disorders: Secondary | ICD-10-CM | POA: Diagnosis not present

## 2013-12-05 NOTE — Progress Notes (Signed)
Patient ID: Jeffrey Campbell, male   DOB: 09/01/1932, 80 y.o.   MRN: [id number] [employer] 30 day cardiac event monitor applied to patient.

## 2013-12-05 NOTE — Telephone Encounter (Signed)
Spoke with pt and verifed that his Doxazosin is a 2 MG tablet, and to take one tablet daily at bedtime.

## 2013-12-05 NOTE — Telephone Encounter (Signed)
Pt has questions about medication that was given to him yesterday. Please call and advise him.

## 2013-12-05 NOTE — Telephone Encounter (Signed)
Called home and pt is at office. Asked Shelly to call me when pt was in room with her and I would come over to discuss with pt.

## 2013-12-07 ENCOUNTER — Telehealth: Payer: Self-pay | Admitting: *Deleted

## 2013-12-07 NOTE — Telephone Encounter (Signed)
Left message to call back.   Received [employer] report showing sinus bradycardia, pause 3.2 sec, HR 35 at 3:18 a.m. Pt had OV with Dr. Radford Pax on 9/8,  hx bradycardia. Monitor ordered to whether some of his dizziness is related to bradycardia/exertion.  Reviewed with DOD, Dr. Marlou Porch - no orders received.

## 2013-12-14 ENCOUNTER — Telehealth: Payer: Self-pay

## 2013-12-14 NOTE — Telephone Encounter (Signed)
**Note De-Identified  Obfuscation** We received a call from [facility] stating that the pt had a 3.2 min. pause this am at 7:35. The pt was unaware of the event and he denies dizziness, lightheadedness and syncope. Discussed with Dr Radford Pax who recommends that the pt continue to wear monitor and to be sure and press button on his monitor if he has any dizziness. The pt verbalized understanding.

## 2013-12-17 ENCOUNTER — Other Ambulatory Visit: Payer: Self-pay | Admitting: *Deleted

## 2013-12-17 ENCOUNTER — Other Ambulatory Visit: Payer: Self-pay | Admitting: General Surgery

## 2013-12-17 ENCOUNTER — Ambulatory Visit (HOSPITAL_COMMUNITY): Payer: Medicare Other | Attending: Cardiology | Admitting: Cardiology

## 2013-12-17 DIAGNOSIS — I10 Essential (primary) hypertension: Secondary | ICD-10-CM | POA: Insufficient documentation

## 2013-12-17 DIAGNOSIS — I341 Nonrheumatic mitral (valve) prolapse: Secondary | ICD-10-CM

## 2013-12-17 DIAGNOSIS — I059 Rheumatic mitral valve disease, unspecified: Secondary | ICD-10-CM | POA: Diagnosis not present

## 2013-12-17 DIAGNOSIS — E119 Type 2 diabetes mellitus without complications: Secondary | ICD-10-CM | POA: Diagnosis not present

## 2013-12-17 DIAGNOSIS — I272 Pulmonary hypertension, unspecified: Secondary | ICD-10-CM

## 2013-12-17 DIAGNOSIS — E785 Hyperlipidemia, unspecified: Secondary | ICD-10-CM | POA: Diagnosis not present

## 2013-12-17 DIAGNOSIS — I340 Nonrheumatic mitral (valve) insufficiency: Secondary | ICD-10-CM

## 2013-12-17 MED ORDER — DOXAZOSIN MESYLATE 2 MG PO TABS: 2.0000 mg | ORAL_TABLET | Freq: Every day | ORAL | Status: DC

## 2013-12-17 NOTE — Progress Notes (Signed)
Echo performed. 

## 2013-12-18 ENCOUNTER — Telehealth: Payer: Self-pay

## 2013-12-18 NOTE — Telephone Encounter (Signed)
Jeffrey Campbell from [facility] called to inform us of critical result. 0749 am Jeffrey Campbell had 3.1s pause. Jeffrey Campbell called Jeffrey Campbell and Jeffrey Campbell st he was asymptomatic.  Attempted to call Jeffrey Campbell after speaking with Jeffrey Campbell, but Jeffrey Campbell unavailable.  Left message with Jeffrey Campbell's wife for him to call the office when possible.

## 2013-12-18 NOTE — Telephone Encounter (Signed)
Patient returned call to office. Jeffrey Campbell confirmed he has not experienced any symptoms, and was unaware he had a pause until notified by [employer]. Dr. Radford Pax made aware--no new orders at this time.  Patient instructed to call the office with questions or concerns.

## 2013-12-20 ENCOUNTER — Telehealth: Payer: Self-pay | Admitting: Cardiology

## 2013-12-20 ENCOUNTER — Telehealth: Payer: Self-pay

## 2013-12-20 NOTE — Telephone Encounter (Signed)
Notified by [employer], patient had 3.1 sec pause in the setting of sinus brady, patient was contacted and he was asymptomatic and sleeping.    Manus Gunning, MD

## 2013-12-20 NOTE — Telephone Encounter (Signed)
Called to change notification criteria per Dr. Radford Pax.  New notification criteria: Do not call unless pause is greater than 4 seconds. Representative st that she would fax sheet to office.  Sheet not yet received for signature.

## 2013-12-21 ENCOUNTER — Telehealth: Payer: Self-pay | Admitting: *Deleted

## 2013-12-21 NOTE — Telephone Encounter (Signed)
Received report from [facility] showing a 3.3 sec pause at 4 am this morning.  They called patient and was told he was fine.  I called pt at 12:10 and he states he feels fine.  Has not had any dizziness or light headedness.  Advised if he has any symptoms that he should call the office.

## 2013-12-24 ENCOUNTER — Encounter: Payer: Self-pay | Admitting: Cardiology

## 2013-12-26 ENCOUNTER — Ambulatory Visit (INDEPENDENT_AMBULATORY_CARE_PROVIDER_SITE_OTHER): Payer: Medicare Other | Admitting: Nurse Practitioner

## 2013-12-26 ENCOUNTER — Encounter: Payer: Self-pay | Admitting: Nurse Practitioner

## 2013-12-26 VITALS — BP 171/72 | HR 40

## 2013-12-26 DIAGNOSIS — I498 Other specified cardiac arrhythmias: Secondary | ICD-10-CM | POA: Diagnosis not present

## 2013-12-26 DIAGNOSIS — R001 Bradycardia, unspecified: Secondary | ICD-10-CM

## 2013-12-26 DIAGNOSIS — I2510 Atherosclerotic heart disease of native coronary artery without angina pectoris: Secondary | ICD-10-CM

## 2013-12-26 NOTE — Progress Notes (Signed)
Exercise Treadmill Test  Pre-Exercise Testing Evaluation Rhythm: sinus bradycardia  Rate: 40 bpm     Test Exercise Tolerance Test Ordering MD: Fransico Him, MD  Interpreting MD: Truitt Merle, NP  Unique Test No: 1  Treadmill:  1  Indication for ETT: Bradycardia/CAD  Contraindication to ETT: No   Stress Modality: exercise - treadmill  Cardiac Imaging Performed: non   Protocol: standard Bruce - maximal  Max BP:  187/54  Max MPHR (bpm):  142 85% MPR (bpm):  121  MPHR obtained (bpm):  81 % MPHR obtained:  57%  Reached 85% MPHR (min:sec):  NA Total Exercise Time (min-sec):  5 minutes  Workload in METS:  7.0 Borg Scale: 15  Reason ETT Terminated:  patient's desire to stop    ST Segment Analysis At Rest: normal ST segments - no evidence of significant ST depression With Exercise: no evidence of significant ST depression  Other Information Arrhythmia:  Yes Angina during ETT:  absent (0) Quality of ETT:  non-diagnostic  ETT Interpretation:  Non-diagnostic  Comments: Patient presents today for routine GXT. Has had bradycardia. GXT today for chronotropic incompetence. Has event monitor on as well until October 10th. No syncope. Says his dizziness has improved with medicine change.    Today the patient exercised on the standard Bruce protocol for a total of 5 minutes.  Fair exercise tolerance.  Hypertensive blood pressure response (patient took no medicines today).  Clinically negative for chest pain. Test was stopped due to fatigue.  EKG negative for ischemia. The patient was only able to get his HR up to 81 bpm - 57% predicted. The tracings show a long first degree AV block and had junctional escape during the study with variable p wave morphology. Reviewed with Dr. Marlou Porch (DOD)   Recommendations: Ensure follow up with Dr. Radford Pax after event monitor.   Patient is agreeable to this plan and will call if any problems develop in the interim.   [name], RN, [facility] [address] [place], [place]  [address] [phone]

## 2013-12-26 NOTE — Patient Instructions (Signed)
Stay on your current medicines  Arrange for a follow up visit with Dr. Radford Pax after October 10th  Call the [facility] office at [phone] if you have any questions, problems or concerns.

## 2013-12-31 ENCOUNTER — Telehealth: Payer: Self-pay | Admitting: Cardiology

## 2013-12-31 DIAGNOSIS — R001 Bradycardia, unspecified: Secondary | ICD-10-CM

## 2013-12-31 NOTE — Addendum Note (Signed)
Addended byUlla Potash H on: 12/31/2013 09:22 AM   Modules accepted: Orders

## 2013-12-31 NOTE — Telephone Encounter (Signed)
Order placed. Msg sent to [facility] to schedule.

## 2013-12-31 NOTE — Telephone Encounter (Signed)
Discussed with Dr. Rayann Heman with EP the rhythm strips from 30 day monitor.  Patient had 5 beats of atrial fibrillation with SVR in the middle of the night while sleeping which was most likely vagally induced.  He has a history of upper airway resistance syndrome but did not have an elevated AHI and insurance would not pay for CPAP.  This was a while ago.  He has never had a TIA or CVA.  At this time I have recommended that he undergo repeat split night PSG to evaluate for OSA and if present treat with CPAP and repeat event monitor on CPAP.  Patient agrees with plan.

## 2014-01-02 ENCOUNTER — Telehealth: Payer: Self-pay | Admitting: *Deleted

## 2014-01-02 NOTE — Telephone Encounter (Signed)
[employer] report sent in today as an alert. The patient is having a junctional rhythm, sinus bradycardia, PAC's, and first degree AV block. This occurred today in the early morning hours while sleeping. Strips reviewed by Dr. Radford Pax and Dr. Lovena Le (EP). No changes needed. Dr. Radford Pax is aware that the patient has a sleep study scheduled for 12/15. She would like this moved up to be done ASAP due to rhythm issues. I will forward a message to [facility] to reschedule.

## 2014-01-15 ENCOUNTER — Telehealth: Payer: Self-pay | Admitting: Cardiology

## 2014-01-15 NOTE — Telephone Encounter (Signed)
Please let patient know that heart monitor showed significant junctional bradycardia not just with sleep but also during the day with HR was low as 33bpm.  He underwent ETT and HR increased to 75bpm during the study.  He did not have any dizziness during the heart monitor.  He has a history of Upper airway resistance but AHi was not high enough for CPAP in the past.  He is now set up for sleep study.  Please refer to EP to evaluate daytime bradycardia although cannot really correlate dizziness with brady episodes.

## 2014-01-15 NOTE — Telephone Encounter (Signed)
The patient is aware of his results. He is agreeable with seeing EP. I will send a message to the [facility] to please arrange for this and call the patient.

## 2014-01-16 ENCOUNTER — Encounter: Payer: Self-pay | Admitting: Internal Medicine

## 2014-01-16 ENCOUNTER — Ambulatory Visit (INDEPENDENT_AMBULATORY_CARE_PROVIDER_SITE_OTHER): Payer: Medicare Other | Admitting: Internal Medicine

## 2014-01-16 VITALS — BP 200/82 | HR 44 | Ht 69.0 in | Wt 174.6 lb

## 2014-01-16 DIAGNOSIS — R001 Bradycardia, unspecified: Secondary | ICD-10-CM | POA: Diagnosis not present

## 2014-01-16 DIAGNOSIS — I2510 Atherosclerotic heart disease of native coronary artery without angina pectoris: Secondary | ICD-10-CM

## 2014-01-16 MED ORDER — HYDRALAZINE HCL 25 MG PO TABS: 25.0000 mg | ORAL_TABLET | Freq: Two times a day (BID) | ORAL | Status: DC

## 2014-01-16 NOTE — Patient Instructions (Signed)
Your physician has recommended you make the following change in your medication:  1.) hydralazine 25 mg two times a day  Your physician recommends that you schedule a follow-up appointment with Dr. Radford Pax.

## 2014-01-16 NOTE — Progress Notes (Signed)
ELECTROPHYSIOLOGY CONSULT NOTE  Patient ID: Jeffrey Campbell, MRN: [id number], DOB/AGE: 78/25/1934 78 y.o. Admit date: (Not on file) Date of Consult: 01/16/2014  Primary Physician: Gerrit Heck, MD Primary Cardiologist: TT  Chief Complaint: Dizziness and bradycardia   HPI Jeffrey Campbell is a 80 y.o. male  Healthy gentleman with a history of hypertension which has been difficult to control in sinus bradycardia which dates back more than a decade. He underwent event recording demonstrating some significant bradycardia with at least one episode of the time pauses of greater than 3 seconds. He has had no syncope or presyncope. He recently did have problems with orthostatic lightheadedness prompting the discontinuation of hydrochlorothiazide with significant resolution of symptoms.  He underwent treadmill testing demonstrated significant chronotropic incompetence; however, he has few symptoms. He is able to mow his 3 hour long without stopping  He has a history of mitral valve disease with prolapse and moderate mitral regurgitation.  Myoview scan 2012 demonstrated no ischemia     Past Medical History  Diagnosis Date  . PUD (peptic ulcer disease)   . Hypertension   . Hyperlipemia   . CAD (coronary artery disease)   . Shutdown renal     after cardiac cath  . Organic impotence   . CKD (chronic kidney disease), stage III   . History of ETOH abuse   . Type 2 diabetes, diet controlled   . Pulmonary hypertension   . Upper airway resistance syndrome   . Carotid artery bruit 03/2010    left--w less then 50% bilateral cardotid artery stenosis by doppler   . OSA (obstructive sleep apnea)     upper airway resistance syndrome with RDI 18/hr - not on CPAP due to insurance not covering  . Glaucoma   . BPH (benign prostatic hyperplasia)   . Hyperthyroidism 08/26/10    radioactive iodine therapy   . Multiple thyroid nodules   . Graves disease   . Heart murmur, systolic     w  mild to mod MR, mild AR/PR/TR echo 2012  . History of PFTs 05/2010    moderate airflow obstruction w reduced DLCO by PFT   . Mitral regurgitation echo 2012    mild to moderate  . MVP (mitral valve prolapse) 11/2012    posterior MVP  . Diastolic dysfunction       Surgical History:  Past Surgical History  Procedure Laterality Date  . Appendectomy    . Heart catherization    . Hernia repair  10/2009  . Cardiac catheterization       Home Meds: Prior to Admission medications   Medication Sig Start Date End Date Taking? Authorizing Provider  amlodipine-atorvastatin (CADUET) 10-20 MG per tablet Take 1 tablet by mouth daily. Once daily   Yes Historical Provider, MD  aspirin 81 MG tablet Take 81 mg by mouth daily.   Yes Historical Provider, MD  doxazosin (CARDURA) 2 MG tablet Take 1 tablet (2 mg total) by mouth daily. 12/17/13  Yes Sueanne Margarita, MD  furosemide (LASIX) 40 MG tablet Take 20 mg by mouth daily as needed. 1/2 tablet as needed for swelling or onset of shortness of breath   Yes Historical Provider, MD  levothyroxine (SYNTHROID, LEVOTHROID) 100 MCG tablet Take 100 mcg by mouth daily before breakfast.   Yes Historical Provider, MD  losartan (COZAAR) 100 MG tablet 1/2 tablet twice daily 11/21/13  Yes Sueanne Margarita, MD  Multiple Vitamin (MULTIVITAMIN) tablet Take 1 tablet by mouth daily.  Yes Historical Provider, MD  nitroGLYCERIN (NITROSTAT) 0.4 MG SL tablet Place 0.4 mg under the tongue every 5 (five) minutes as needed for chest pain.    Yes Historical Provider, MD  TRAVATAN Z 0.004 % ophthalmic solution Place 1 drop into both eyes at bedtime.    Yes Historical Provider, MD     Allergies:  Allergies  Allergen Reactions  . Tiotropium Bromide Monohydrate Other (See Comments)    Dry mouth  . Budesonide-Formoterol Fumarate     Pain in muscles, cramps in hands and legs  . Other     DYE- Causes kidney to shut down  . Tricor [Fenofibrate]     Unknown   . Zocor [Simvastatin]      Liver problems     History   Social History  . Marital Status: Married    Spouse Name: N/A    Number of Children: 4  . Years of Education: N/A   Occupational History  . Retired     [employer]   Social History Main Topics  . Smoking status: Former Smoker -- 2.00 packs/day for 40 years    Types: Cigarettes    Quit date: 03/29/1984  . Smokeless tobacco: Never Used  . Alcohol Use: No     Comment: quit in 1981  . Drug Use: No  . Sexual Activity: Not on file   Other Topics Concern  . Not on file   Social History Narrative  . No narrative on file     Family History  Problem Relation Age of Onset  . Kidney failure Father   . Hypertension Father   . Colon cancer Mother   . Colon cancer Brother   . Lung disease Brother   . Colon cancer Maternal Uncle      ROS:  Please see the history of present illness.   All other systems reviewed and negative.    Physical Exam Blood pressure 200/82, pulse 44, height 5\' 9"  (1.753 m), weight 174 lb 9.6 oz (79.198 kg). General: Well developed, well nourished male in no acute distress. Head: Normocephalic, atraumatic, sclera non-icteric, no xanthomas, nares are without discharge. EENT: normal Lymph Nodes:  none Back: without scoliosis/kyphosis, no CVA tendersness Neck: Negative for carotid bruits. JVD not elevated. Lungs: Clear bilaterally to auscultation without wheezes, rales, or rhonchi. Breathing is unlabored. Heart: RRR with S1 S2. Diminished S1 2/6 systolic murmur , rubs, or gallops appreciated. Abdomen: Soft, non-tender, non-distended with normoactive bowel sounds. No hepatomegaly. No rebound/guarding. No obvious abdominal masses. Msk:  Strength and tone appear normal for age. Extremities: No clubbing or cyanosis. No  edema.  Distal pedal pulses are 2+ and equal bilaterally. Skin: Warm and Dry Neuro: Alert and oriented X 3. CN III-XII intact Grossly normal sensory and motor function . Psych:  Responds to questions  appropriately with a normal affect.      Labs: Cardiac Enzymes No results found for this basename: CKTOTAL, CKMB, TROPONINI,  in the last 72 hours CBC Lab Results  Component Value Date   WBC 8.2 12/04/2012   HGB 12.1* 12/04/2012   HCT 35.6* 12/04/2012   MCV 91.8 12/04/2012   PLT 201 12/04/2012   PROTIME: No results found for this basename: LABPROT, INR,  in the last 72 hours Chemistry No results found for this basename: NA, K, CL, CO2, BUN, CREATININE, CALCIUM, LABALBU, PROT, BILITOT, ALKPHOS, ALT, AST, GLUCOSE,  in the last 168 hours Lipids Lab Results  Component Value Date   CHOL 135 12/04/2013  HDL 62.80 12/04/2013   LDLCALC 65 12/04/2013   TRIG 37.0 12/04/2013   BNP No results found for this basename: probnp   Miscellaneous No results found for this basename: DDIMER    Radiology/Studies:  No results found.  EKG: Sinus bradycardia at 44 Intervals 25/08/45 Right atrial enlargement   Treadmill  9/15 and repeat maximum heart rate of 81 per day 57% predicted   Assessment and Plan:  Hypertension  sinus bradycardia   The patient has sinus bradycardia which is best as I can tell is asymptomatic. He did have a daytime pauses of 3 seconds; it is not clear whether he did not off to sleep or not.  The  lightheadedness that he had with orthostatic and resolved with discontinuation of his hydrochlorothiazide.   Not withstanding his chronotropic incompetence, at this juncture I do not see an indication for pacing.  His blood pressure is poorly controlled; we will give him one dose of clonidine which he has not tolerated as an outpatient. We will begin him on hydralazine 25 twice daily and asked that he followup with his PCP next week or 2.    Virl Axe

## 2014-01-28 ENCOUNTER — Other Ambulatory Visit: Payer: Medicare Other

## 2014-02-05 DIAGNOSIS — H34831 Tributary (branch) retinal vein occlusion, right eye: Secondary | ICD-10-CM | POA: Diagnosis not present

## 2014-02-05 DIAGNOSIS — H4010X2 Unspecified open-angle glaucoma, moderate stage: Secondary | ICD-10-CM | POA: Diagnosis not present

## 2014-02-05 DIAGNOSIS — H4010X0 Unspecified open-angle glaucoma, stage unspecified: Secondary | ICD-10-CM | POA: Diagnosis not present

## 2014-02-08 ENCOUNTER — Ambulatory Visit (INDEPENDENT_AMBULATORY_CARE_PROVIDER_SITE_OTHER): Payer: Medicare Other | Admitting: Cardiology

## 2014-02-08 ENCOUNTER — Telehealth: Payer: Self-pay | Admitting: Critical Care Medicine

## 2014-02-08 ENCOUNTER — Encounter: Payer: Self-pay | Admitting: Cardiology

## 2014-02-08 VITALS — BP 160/44 | HR 47 | Ht 69.0 in | Wt 176.8 lb

## 2014-02-08 DIAGNOSIS — IMO0002 Reserved for concepts with insufficient information to code with codable children: Secondary | ICD-10-CM

## 2014-02-08 DIAGNOSIS — I10 Essential (primary) hypertension: Secondary | ICD-10-CM

## 2014-02-08 DIAGNOSIS — R0989 Other specified symptoms and signs involving the circulatory and respiratory systems: Secondary | ICD-10-CM

## 2014-02-08 DIAGNOSIS — I2583 Coronary atherosclerosis due to lipid rich plaque: Principal | ICD-10-CM

## 2014-02-08 DIAGNOSIS — I341 Nonrheumatic mitral (valve) prolapse: Secondary | ICD-10-CM

## 2014-02-08 DIAGNOSIS — Z23 Encounter for immunization: Secondary | ICD-10-CM | POA: Diagnosis not present

## 2014-02-08 DIAGNOSIS — I2510 Atherosclerotic heart disease of native coronary artery without angina pectoris: Secondary | ICD-10-CM | POA: Diagnosis not present

## 2014-02-08 DIAGNOSIS — R001 Bradycardia, unspecified: Secondary | ICD-10-CM

## 2014-02-08 DIAGNOSIS — I272 Other secondary pulmonary hypertension: Secondary | ICD-10-CM | POA: Diagnosis not present

## 2014-02-08 DIAGNOSIS — I340 Nonrheumatic mitral (valve) insufficiency: Secondary | ICD-10-CM | POA: Diagnosis not present

## 2014-02-08 LAB — BASIC METABOLIC PANEL
BUN: 37 mg/dL — ABNORMAL HIGH (ref 6–23)
CO2: 22 mEq/L (ref 19–32)
Calcium: 9.8 mg/dL (ref 8.4–10.5)
Chloride: 110 mEq/L (ref 96–112)
Creatinine, Ser: 2.4 mg/dL — ABNORMAL HIGH (ref 0.4–1.5)
GFR: 34.24 mL/min — ABNORMAL LOW (ref >60.00)
Glucose, Bld: 117 mg/dL — ABNORMAL HIGH (ref 70–99)
POTASSIUM: 4.2 meq/L (ref 3.5–5.1)
Sodium: 141 mEq/L (ref 135–145)

## 2014-02-08 NOTE — Patient Instructions (Signed)
Your physician has requested that you have a carotid duplex. This test is an ultrasound of the carotid arteries in your neck. It looks at blood flow through these arteries that supply the brain with blood. Allow one hour for this exam. There are no restrictions or special instructions.  Your physician recommends that you have lab work TODAY (BMET).   Your physician has requested that you have an echocardiogram in September 2016. Echocardiography is a painless test that uses sound waves to create images of your heart. It provides your doctor with information about the size and shape of your heart and how well your heart's chambers and valves are working. This procedure takes approximately one hour. There are no restrictions for this procedure.  Your physician wants you to follow-up in: 6 months with Dr. Radford Pax. You will receive a reminder letter in the mail two months in advance. If you don't receive a letter, please call our office to schedule the follow-up appointment.

## 2014-02-08 NOTE — Telephone Encounter (Signed)
Called and spoke to pt. Pt stated he cancelled his sleep study in December and wanted to let Dr. Joya Gaskins know. I informed the pt that Dr. Joya Gaskins did not order the sleep study, it was Dr. Radford Pax. Pt aware that he will need to contact Dr. Radford Pax to let her know that he has cancelled the study. Pt verbalized understanding and denied any further questions or concerns at this time.

## 2014-02-08 NOTE — Progress Notes (Signed)
Jeffrey Campbell, [address] [place], [place]  [address] Phone: [phone] Fax:  [phone]  Date:  02/08/2014   ID:  Jeffrey Campbell, DOB 08-21-1932, MRN [id number]  PCP:  Gerrit Heck, MD  Cardiologist:  Fransico Him, MD    History of Present Illness: Jeffrey Campbell is a 80 y.o. male with a history of ASCAD/HTN/dyslipidemia/mild MR with posterior MVP/bradycardia and moderate pulmonary HTN presents today for followup.  He denies any  SOB, DOE, LE edema, palpitations or syncope.Occasionally he will have a funny feeling in his chest that he cannot explain and he says it is not a discomfort but just an awareness if he exerts himself with significant exertion.  He occasionally has some dizziness when out mowing the yard.  Wt Readings from Last 3 Encounters:  02/08/14 176 lb 12.8 oz (80.196 kg)  01/16/14 174 lb 9.6 oz (79.198 kg)  12/04/13 171 lb 9.6 oz (77.837 kg)     Past Medical History  Diagnosis Date  . PUD (peptic ulcer disease)   . Hypertension   . Hyperlipemia   . CAD (coronary artery disease)   . Shutdown renal     after cardiac cath  . Organic impotence   . CKD (chronic kidney disease), stage III   . History of ETOH abuse   . Type 2 diabetes, diet controlled   . Pulmonary hypertension echo 2015    moderate PASP 54mmHg  . Upper airway resistance syndrome   . Carotid artery bruit 03/2010    left--w less then 50% bilateral cardotid artery stenosis by doppler   . OSA (obstructive sleep apnea)     upper airway resistance syndrome with RDI 18/hr - not on CPAP due to insurance not covering  . Glaucoma   . BPH (benign prostatic hyperplasia)   . Hyperthyroidism 08/26/10    radioactive iodine therapy   . Multiple thyroid nodules   . Graves disease   . History of PFTs 05/2010    moderate airflow obstruction w reduced DLCO by PFT   . Mitral regurgitation echo 2015    mild  . MVP (mitral valve prolapse) 11/2012    posterior MVP  . Diastolic dysfunction   . Heart  murmur, systolic     Current Outpatient Prescriptions  Medication Sig Dispense Refill  . amlodipine-atorvastatin (CADUET) 10-20 MG per tablet Take 1 tablet by mouth daily. Once daily    . aspirin 81 MG tablet Take 81 mg by mouth daily.    Marland Kitchen doxazosin (CARDURA) 2 MG tablet Take 1 tablet (2 mg total) by mouth daily. 90 tablet 1  . furosemide (LASIX) 40 MG tablet Take 20 mg by mouth daily as needed. 1/2 tablet as needed for swelling or onset of shortness of breath    . hydrALAZINE (APRESOLINE) 25 MG tablet Take 1 tablet (25 mg total) by mouth 2 (two) times daily. 180 tablet 3  . levothyroxine (SYNTHROID, LEVOTHROID) 100 MCG tablet Take 100 mcg by mouth daily before breakfast.    . losartan (COZAAR) 100 MG tablet 1/2 tablet twice daily 30 tablet 3  . Multiple Vitamin (MULTIVITAMIN) tablet Take 1 tablet by mouth daily.      . nitroGLYCERIN (NITROSTAT) 0.4 MG SL tablet Place 0.4 mg under the tongue every 5 (five) minutes as needed for chest pain.     . TRAVATAN Z 0.004 % ophthalmic solution Place 1 drop into both eyes at bedtime.      No current facility-administered medications for this visit.  Allergies:    Allergies  Allergen Reactions  . Tiotropium Bromide Monohydrate Other (See Comments)    Dry mouth  . Budesonide-Formoterol Fumarate     Pain in muscles, cramps in hands and legs  . Other     DYE- Causes kidney to shut down  . Tricor [Fenofibrate]     Unknown   . Zocor [Simvastatin]     Liver problems     Social History:  The patient  reports that he quit smoking about 29 years ago. His smoking use included Cigarettes. He has a 80 pack-year smoking history. He has never used smokeless tobacco. He reports that he does not drink alcohol or use illicit drugs.   Family History:  The patient's family history includes Colon cancer in his brother, maternal uncle, and mother; Hypertension in his father; Kidney failure in his father; Lung disease in his brother.   ROS:  Please see the  history of present illness.      All other systems reviewed and negative.   PHYSICAL EXAM: VS:  BP 160/44 mmHg  Pulse 47  Ht 5\' 9"  (1.753 m)  Wt 176 lb 12.8 oz (80.196 kg)  BMI 26.10 kg/m2  SpO2 96% Well nourished, well developed, in no acute distress HEENT: normal Neck: no JVDbilateral carotid artery bruits Cardiac:  normal S1, S2; RRR; 2/6 SM at RUSB to LLSB Lungs:  clear to auscultation bilaterally, no wheezing, rhonchi or rales Abd: soft, nontender, no hepatomegaly Ext: no edema Skin: warm and dry Neuro:  CNs 2-12 intact, no focal abnormalities noted  ASSESSMENT AND PLAN:  1. ASCAD with no angina - continue ASA 2. Asymptomatic Bradycardia 3. HTN - borderline controlled.   - continue Caduet/doxazosin/Losartan/hydralazine             - I have asked him to check his BP daily for a week and call with the results            - check BMET 4.  Mild  MR with posterior leaflet MVP 5.  Dyslipidemia - at goal with LDL 65 - continue Caduet  6.  Moderate pulmonary HTN - repeat echo in 1 year 7.  Bilateral carotid artery bruits - repeat carotid dopplers  Followup with me in 6 months   Signed, Fransico Him, MD [facility] 02/08/2014 9:47 AM

## 2014-02-11 ENCOUNTER — Ambulatory Visit (HOSPITAL_COMMUNITY): Payer: Medicare Other | Attending: Cardiology | Admitting: Cardiology

## 2014-02-11 DIAGNOSIS — E119 Type 2 diabetes mellitus without complications: Secondary | ICD-10-CM | POA: Diagnosis not present

## 2014-02-11 DIAGNOSIS — E785 Hyperlipidemia, unspecified: Secondary | ICD-10-CM | POA: Diagnosis not present

## 2014-02-11 DIAGNOSIS — I739 Peripheral vascular disease, unspecified: Secondary | ICD-10-CM

## 2014-02-11 DIAGNOSIS — I10 Essential (primary) hypertension: Secondary | ICD-10-CM | POA: Diagnosis not present

## 2014-02-11 DIAGNOSIS — R0989 Other specified symptoms and signs involving the circulatory and respiratory systems: Secondary | ICD-10-CM | POA: Diagnosis not present

## 2014-02-11 DIAGNOSIS — I2583 Coronary atherosclerosis due to lipid rich plaque: Secondary | ICD-10-CM

## 2014-02-11 DIAGNOSIS — I2510 Atherosclerotic heart disease of native coronary artery without angina pectoris: Secondary | ICD-10-CM | POA: Diagnosis not present

## 2014-02-11 DIAGNOSIS — I779 Disorder of arteries and arterioles, unspecified: Secondary | ICD-10-CM

## 2014-02-11 NOTE — Progress Notes (Signed)
Carotid duplex performed 

## 2014-02-13 ENCOUNTER — Telehealth: Payer: Self-pay | Admitting: Cardiology

## 2014-02-13 DIAGNOSIS — I10 Essential (primary) hypertension: Secondary | ICD-10-CM

## 2014-02-13 NOTE — Telephone Encounter (Signed)
New problem    Pt returning call from nurse.

## 2014-02-13 NOTE — Telephone Encounter (Signed)
Left message to call back  

## 2014-02-14 MED ORDER — HYDRALAZINE HCL 25 MG PO TABS: 25.0000 mg | ORAL_TABLET | Freq: Two times a day (BID) | ORAL | Status: DC

## 2014-02-14 NOTE — Telephone Encounter (Signed)
New msg   Patient returning call from Nurse [name] from yesterday 11/18.

## 2014-02-14 NOTE — Telephone Encounter (Signed)
Instructed patient to STOP Lasix. Repeat BMET scheduled for Wednesday, November 25. Patient agrees with treatment plan.   All orders placed.

## 2014-02-14 NOTE — Telephone Encounter (Signed)
-----   Message from Sueanne Margarita, MD sent at 02/11/2014  6:52 PM EST ----- Please have patient stop Lasix and recheck BMET in 1 week

## 2014-02-15 DIAGNOSIS — H4011X2 Primary open-angle glaucoma, moderate stage: Secondary | ICD-10-CM | POA: Diagnosis not present

## 2014-02-20 ENCOUNTER — Other Ambulatory Visit (INDEPENDENT_AMBULATORY_CARE_PROVIDER_SITE_OTHER): Payer: Medicare Other | Admitting: *Deleted

## 2014-02-20 DIAGNOSIS — I10 Essential (primary) hypertension: Secondary | ICD-10-CM | POA: Diagnosis not present

## 2014-02-20 LAB — BASIC METABOLIC PANEL
BUN: 39 mg/dL — ABNORMAL HIGH (ref 6–23)
CALCIUM: 9.1 mg/dL (ref 8.4–10.5)
CO2: 23 meq/L (ref 19–32)
Chloride: 106 mEq/L (ref 96–112)
Creatinine, Ser: 2.3 mg/dL — ABNORMAL HIGH (ref 0.4–1.5)
GFR: 35.63 mL/min — ABNORMAL LOW (ref >60.00)
Glucose, Bld: 103 mg/dL — ABNORMAL HIGH (ref 70–99)
POTASSIUM: 4.6 meq/L (ref 3.5–5.1)
SODIUM: 136 meq/L (ref 135–145)

## 2014-02-22 ENCOUNTER — Telehealth: Payer: Self-pay

## 2014-02-22 DIAGNOSIS — R001 Bradycardia, unspecified: Secondary | ICD-10-CM

## 2014-02-22 NOTE — Telephone Encounter (Signed)
Letter received from Medical Records from patient. BP log: 11/11 2005: 145/53 HR 45 11/12 0719: 129/58 HR 39           1939: 143/50 HR 43 11/13 0555: 138/52 HR 40           1227: 152/64 HR 49 11/14 0702: 158/66 HR 42           1733: 155/63 HR 45 11/15 0627: 160/64 HR 47 11/16 0739: 138/57 HR 43 11/17 0631: 157/67 HR 39           1555: 161/65 HR 46 11/18 0724: 165/67 HR 39           0759: 142/59 HR 41 11/19 0656: 149/62 HR 39           2050: 152/57 HR 41 11/20 0629: 136/54 HR 37           1145: 121/46 HR 42 11/21 0643: 136/56 HR 34 11/22 0626: 144/57 HR 34           1953: 148/58 HR 41 11/23 0700: 143/60 HR 38           1943: 145/55 HR 38 11/24 0642: 130/56 HR 36 11/25 0751: 129/55 HR 30  To Dr. Radford Pax for review.

## 2014-02-25 ENCOUNTER — Telehealth: Payer: Self-pay | Admitting: Cardiology

## 2014-02-25 NOTE — Telephone Encounter (Signed)
Informed patient a 24 hour holter monitor is being ordered to assess average HR and a scheduler will call him soon to set him up. Patient agrees with treatment plan.

## 2014-02-25 NOTE — Telephone Encounter (Signed)
Patient very bradycardic.  Please order 24 hour Holter monitor to assess average HR

## 2014-02-25 NOTE — Telephone Encounter (Signed)
Left message to call back  

## 2014-02-25 NOTE — Telephone Encounter (Signed)
F/U   Patient is returning a call. Please contact at [phone].

## 2014-02-26 MED ORDER — HYDRALAZINE HCL 25 MG PO TABS: 25.0000 mg | ORAL_TABLET | Freq: Three times a day (TID) | ORAL | Status: DC

## 2014-02-26 NOTE — Telephone Encounter (Signed)
Informed patient of lab results. Instructed patient to STOP Losartan and to INCREASE Hydralazine to 25 mg TID.  Also instructed patient to check his BP daily for a week (2 hours after taking his meds) and call with the results.   Patient agrees with treatment plan.  All orders placed.

## 2014-02-26 NOTE — Telephone Encounter (Signed)
-----   Message from Sueanne Margarita, MD sent at 02/25/2014 10:47 AM EST ----- Creatinine remains elevated.  Please have patient stop Losartan and increase Hydralazine to 25mg  TID.  Please have him check his BP daily for a week (2 hours after taking his meds) and call with results

## 2014-02-26 NOTE — Telephone Encounter (Signed)
Follow up ° ° ° ° °Returned [name]'s call °

## 2014-03-06 ENCOUNTER — Encounter: Payer: Self-pay | Admitting: *Deleted

## 2014-03-06 ENCOUNTER — Encounter (INDEPENDENT_AMBULATORY_CARE_PROVIDER_SITE_OTHER): Payer: Medicare Other

## 2014-03-06 DIAGNOSIS — R001 Bradycardia, unspecified: Secondary | ICD-10-CM | POA: Diagnosis not present

## 2014-03-06 NOTE — Progress Notes (Signed)
Patient ID: Jeffrey Campbell, male   DOB: May 01, 1932, 80 y.o.   MRN: [id number] [employer] 24 hour holter monitor applied to patient.

## 2014-03-12 ENCOUNTER — Encounter (HOSPITAL_BASED_OUTPATIENT_CLINIC_OR_DEPARTMENT_OTHER): Payer: Medicare Other

## 2014-03-12 ENCOUNTER — Telehealth: Payer: Self-pay | Admitting: Cardiology

## 2014-03-12 NOTE — Telephone Encounter (Signed)
Patient informed of monitor results and verbal understanding expressed.   Pt st his BP has been extremely high since he changed his medicine on Dec. 1.  Today's BP readings: 0430: BP 150/63 HR 40 1000: BP 178/70  1002: BP 142/61  1712: 149/58 HR 44  Both 1000 readings were taken with 2 different machines.   To Dr. Radford Pax for review and recommendations.

## 2014-03-12 NOTE — Telephone Encounter (Signed)
Unclear whether his BP cuff is accurate since 2 different machines gave 2 different readings.  Please have him come in for BP check and bring in his machine to see if it is accurate.  Please find out if he is having any fatigue or dizziness

## 2014-03-12 NOTE — Telephone Encounter (Signed)
Please let patient know that heart monitor showed sinus bradycardia with average HR 41bpm with slowest HR 29bpm during sleep with occasoinall PAC's and PVC's and blocked PAC's - please find out if patient is fatigued for has dizziness

## 2014-03-13 NOTE — Telephone Encounter (Signed)
Pt st he was put on new eye drops November 20 for glaucoma and it says side effects are bradycardia and dizziness. He st he has not really experienced any dizziness, but does feel tired.  BP check to compare home cuffs scheduled for 12/23.  Patient agrees with treatment plan.

## 2014-03-14 NOTE — Telephone Encounter (Signed)
yes

## 2014-03-20 ENCOUNTER — Ambulatory Visit (INDEPENDENT_AMBULATORY_CARE_PROVIDER_SITE_OTHER): Payer: Medicare Other

## 2014-03-20 VITALS — BP 160/70 | HR 40 | Resp 12

## 2014-03-20 DIAGNOSIS — I10 Essential (primary) hypertension: Secondary | ICD-10-CM

## 2014-03-20 MED ORDER — HYDRALAZINE HCL 50 MG PO TABS: 50.0000 mg | ORAL_TABLET | Freq: Three times a day (TID) | ORAL | Status: DC

## 2014-03-20 NOTE — Patient Instructions (Signed)
Per Dr. Radford Pax , patient to increase Hydralazine from 25mg  to 50mg  tid. Continue all other meds. He is to check his bP at home once a day with the BP cuff that read 160/70. Call us in 1 week with this list of readings.

## 2014-03-25 ENCOUNTER — Telehealth: Payer: Self-pay | Admitting: Cardiology

## 2014-03-25 ENCOUNTER — Encounter: Payer: Self-pay | Admitting: Cardiology

## 2014-03-25 NOTE — Telephone Encounter (Signed)
Pt st he was on Travatan but now is on Combigan 1 gtt in both eyes BID.  Med list updated.

## 2014-03-25 NOTE — Addendum Note (Signed)
Addended by: Harland German A on: 03/25/2014 05:01 PM   Modules accepted: Orders, Medications

## 2014-03-25 NOTE — Telephone Encounter (Signed)
Please find out the name of the new eye drops patient was started on for glaucoma

## 2014-03-25 NOTE — Telephone Encounter (Signed)
This encounter was created in error - please disregard.

## 2014-03-26 NOTE — Telephone Encounter (Signed)
Please have patient notify his ophtalmologist that he needs to be switched off combigan eye gtts due to bradycardia.  Once off for a week I would like him to come in for an EKG

## 2014-03-27 ENCOUNTER — Encounter: Payer: Self-pay | Admitting: Cardiology

## 2014-03-27 NOTE — Telephone Encounter (Signed)
Spoke with patient. He informed me Dr. Gershon Crane prescribed the Combigan.  Called Dr. Kellie Moor office; awaiting call back from doctor.

## 2014-03-27 NOTE — Telephone Encounter (Signed)
Representative calling from Dr. Kellie Moor office stating they are switching the patient from Combigan to [place] BID. Verified this is not a beta-blocker. Notified patient of new prescription called in by Dr. Gershon Crane and instructed patient to pick up samples at their office per technician.  EKG scheduled for 04/04/14 at 0900. Patient agrees with treatment plan.  Med list updated.

## 2014-03-27 NOTE — Addendum Note (Signed)
Addended by: Harland German A on: 03/27/2014 10:19 AM   Modules accepted: Orders, Medications

## 2014-03-28 ENCOUNTER — Telehealth: Payer: Self-pay

## 2014-03-28 NOTE — Telephone Encounter (Signed)
Received BP log from patient:  12/23 1106: BP 173/70 HR 36 12/24 0855: BP 130/65 HR 34 12/25 1446: BP 141/63 HR 39 12/26 0940: BP 142/60 HR 36 12/27 0628: BP 153/64 HR 38 12/28 0840: BP 155/58 HR 35 12/29 1111: BP 155/59 HR 39  Patient has since STOPPED Combigan eye gtts and started one that is not a beta-blocker. He has an EKG scheduled for 04/04/2014.  To Dr. Radford Pax for review.

## 2014-03-28 NOTE — Telephone Encounter (Signed)
Increase doxazosin to 4mg  at bedtime nightly and check BP and HR daily for a week now that he is off Combigan eye gtts and call with results

## 2014-04-02 MED ORDER — DOXAZOSIN MESYLATE 2 MG PO TABS: 4.0000 mg | ORAL_TABLET | Freq: Every day | ORAL | Status: DC

## 2014-04-02 NOTE — Addendum Note (Signed)
Addended by: Harland German A on: 04/02/2014 06:16 PM   Modules accepted: Orders

## 2014-04-02 NOTE — Telephone Encounter (Addendum)
Instructed patient to INCREASE doxazosin to 4 mg at bedtime nightly and to check BP and HR daily for a week and call with results. Patient agrees with treatment plan.

## 2014-04-04 ENCOUNTER — Encounter: Payer: Self-pay | Admitting: Cardiology

## 2014-04-04 ENCOUNTER — Encounter: Payer: Self-pay | Admitting: *Deleted

## 2014-04-04 ENCOUNTER — Ambulatory Visit (INDEPENDENT_AMBULATORY_CARE_PROVIDER_SITE_OTHER): Payer: Medicare Other | Admitting: *Deleted

## 2014-04-04 VITALS — BP 166/60 | HR 57 | Ht 69.0 in | Wt 172.8 lb

## 2014-04-04 DIAGNOSIS — R001 Bradycardia, unspecified: Secondary | ICD-10-CM | POA: Diagnosis not present

## 2014-04-04 NOTE — Patient Instructions (Signed)
Continue medications as listed.  EKG will be sent to Dr Radford Pax for review.  If any changes are needed you will be contacted.  Follow up with Dr Radford Pax as scheduled.  Please call with any concerns or questions.  Thank you for choosing [facility]!!

## 2014-04-04 NOTE — Progress Notes (Signed)
1.) Reason for visit:  EKG after changing from Combigan to [place]  2.) Name of MD requesting visit: Dr Radford Pax  H&P: HX of bradycardia - first HR taken was 48 bpm 3.) ROS related to problem: Feels fine, No complaints.  Doesn't remember if he took Doxazosin last night at bedtime or not.  4.) Assessment and plan per MD: EKG showed sinus bradycardia at 57bpm.  HR improved after changing eye gtts.  Signed: Fransico Him, MD [facility] 04/04/2014

## 2014-04-29 ENCOUNTER — Encounter: Payer: Self-pay | Admitting: Cardiology

## 2014-04-29 DIAGNOSIS — R911 Solitary pulmonary nodule: Secondary | ICD-10-CM | POA: Diagnosis not present

## 2014-04-29 DIAGNOSIS — R5383 Other fatigue: Secondary | ICD-10-CM | POA: Diagnosis not present

## 2014-04-29 DIAGNOSIS — Z1389 Encounter for screening for other disorder: Secondary | ICD-10-CM | POA: Diagnosis not present

## 2014-04-29 DIAGNOSIS — N183 Chronic kidney disease, stage 3 (moderate): Secondary | ICD-10-CM | POA: Diagnosis not present

## 2014-04-29 DIAGNOSIS — I10 Essential (primary) hypertension: Secondary | ICD-10-CM | POA: Diagnosis not present

## 2014-04-29 DIAGNOSIS — E119 Type 2 diabetes mellitus without complications: Secondary | ICD-10-CM | POA: Diagnosis not present

## 2014-04-29 DIAGNOSIS — Z0000 Encounter for general adult medical examination without abnormal findings: Secondary | ICD-10-CM | POA: Diagnosis not present

## 2014-04-29 DIAGNOSIS — E785 Hyperlipidemia, unspecified: Secondary | ICD-10-CM | POA: Diagnosis not present

## 2014-04-29 DIAGNOSIS — J069 Acute upper respiratory infection, unspecified: Secondary | ICD-10-CM | POA: Diagnosis not present

## 2014-05-01 ENCOUNTER — Other Ambulatory Visit: Payer: Self-pay | Admitting: Family Medicine

## 2014-05-01 DIAGNOSIS — R911 Solitary pulmonary nodule: Secondary | ICD-10-CM

## 2014-05-02 ENCOUNTER — Ambulatory Visit
Admission: RE | Admit: 2014-05-02 | Discharge: 2014-05-02 | Disposition: A | Discharge status: Home / Self Care | Payer: Medicare Other | Source: Ambulatory Visit | Attending: Family Medicine | Admitting: Family Medicine

## 2014-05-02 DIAGNOSIS — R911 Solitary pulmonary nodule: Secondary | ICD-10-CM

## 2014-05-02 DIAGNOSIS — J9809 Other diseases of bronchus, not elsewhere classified: Secondary | ICD-10-CM | POA: Diagnosis not present

## 2014-05-02 DIAGNOSIS — R918 Other nonspecific abnormal finding of lung field: Secondary | ICD-10-CM | POA: Diagnosis not present

## 2014-05-02 DIAGNOSIS — J90 Pleural effusion, not elsewhere classified: Secondary | ICD-10-CM | POA: Diagnosis not present

## 2014-05-08 ENCOUNTER — Other Ambulatory Visit: Payer: Self-pay

## 2014-05-08 DIAGNOSIS — D649 Anemia, unspecified: Secondary | ICD-10-CM | POA: Diagnosis not present

## 2014-05-08 MED ORDER — DOXAZOSIN MESYLATE 2 MG PO TABS: 4.0000 mg | ORAL_TABLET | Freq: Every day | ORAL | Status: DC

## 2014-05-09 DIAGNOSIS — Z961 Presence of intraocular lens: Secondary | ICD-10-CM | POA: Diagnosis not present

## 2014-05-09 DIAGNOSIS — H4011X1 Primary open-angle glaucoma, mild stage: Secondary | ICD-10-CM | POA: Diagnosis not present

## 2014-05-10 ENCOUNTER — Other Ambulatory Visit: Payer: Self-pay

## 2014-05-10 MED ORDER — DOXAZOSIN MESYLATE 2 MG PO TABS: 4.0000 mg | ORAL_TABLET | Freq: Every day | ORAL | Status: DC

## 2014-05-14 DIAGNOSIS — R911 Solitary pulmonary nodule: Secondary | ICD-10-CM | POA: Diagnosis not present

## 2014-05-14 DIAGNOSIS — D649 Anemia, unspecified: Secondary | ICD-10-CM | POA: Diagnosis not present

## 2014-05-15 ENCOUNTER — Telehealth: Payer: Self-pay

## 2014-05-15 DIAGNOSIS — E785 Hyperlipidemia, unspecified: Secondary | ICD-10-CM

## 2014-05-15 MED ORDER — AMLODIPINE-ATORVASTATIN 10-40 MG PO TABS: 1.0000 | ORAL_TABLET | Freq: Every day | ORAL | Status: DC

## 2014-05-15 NOTE — Telephone Encounter (Signed)
Patient informed of results and verbal understanding expressed.  Instructed patient to INCREASE Caduet to 10/40 mg daily. Rx sent to [facility] per patient request. Pt refused local pharmacy Rx. Fasting lab appointment scheduled 4/12 per patient request. Patient agrees with treatment plan.

## 2014-06-04 DIAGNOSIS — H4010X2 Unspecified open-angle glaucoma, moderate stage: Secondary | ICD-10-CM | POA: Diagnosis not present

## 2014-06-04 DIAGNOSIS — H4010X0 Unspecified open-angle glaucoma, stage unspecified: Secondary | ICD-10-CM | POA: Diagnosis not present

## 2014-06-04 DIAGNOSIS — H34831 Tributary (branch) retinal vein occlusion, right eye: Secondary | ICD-10-CM | POA: Diagnosis not present

## 2014-06-06 ENCOUNTER — Ambulatory Visit: Payer: Medicare Other | Admitting: Cardiology

## 2014-06-07 DIAGNOSIS — R350 Frequency of micturition: Secondary | ICD-10-CM | POA: Diagnosis not present

## 2014-06-07 DIAGNOSIS — N401 Enlarged prostate with lower urinary tract symptoms: Secondary | ICD-10-CM | POA: Diagnosis not present

## 2014-06-07 DIAGNOSIS — N138 Other obstructive and reflux uropathy: Secondary | ICD-10-CM | POA: Diagnosis not present

## 2014-06-07 DIAGNOSIS — R3915 Urgency of urination: Secondary | ICD-10-CM | POA: Diagnosis not present

## 2014-06-24 ENCOUNTER — Encounter: Payer: Self-pay | Admitting: Cardiology

## 2014-06-24 DIAGNOSIS — I6529 Occlusion and stenosis of unspecified carotid artery: Secondary | ICD-10-CM | POA: Insufficient documentation

## 2014-06-24 NOTE — Progress Notes (Signed)
Cardiology Office Note   Date:  06/25/2014   ID:  Jeffrey Campbell, DOB [birth year], MRN [id number]  PCP:  Gerrit Heck, MD  Cardiologist:   Sueanne Margarita, MD   Chief Complaint  Patient presents with  . Coronary Artery Disease  . Hypertension  . Hyperlipidemia      History of Present Illness: Jeffrey Campbell is a 81 y.o. male with a history of ASCAD/HTN/dyslipidemia/mild MR with posterior MVP/bradycardia and moderate pulmonary HTN presents today for followup. He denies any SOB, DOE, LE edema, palpitations or syncope.  Over the weekend he had some left arm pain, shoulder and back pain.  Movement makes it go away.  This is similar to what he had in 11/2012 except this time on CP.  When he moves his shoulders and neck it goes away completely and then reoccurs.  He denies any SOB, nausea or diaphoresis with the discomfort.  He brought his BP readings today which range from 119-184/50-60's.     Past Medical History  Diagnosis Date  . PUD (peptic ulcer disease)   . Hypertension   . Hyperlipemia   . CAD (coronary artery disease)   . Shutdown renal     after cardiac cath  . Organic impotence   . CKD (chronic kidney disease), stage III   . History of ETOH abuse   . Type 2 diabetes, diet controlled   . Pulmonary hypertension echo 2015    moderate PASP 80mmHg  . Upper airway resistance syndrome   . OSA (obstructive sleep apnea)     upper airway resistance syndrome with RDI 18/hr - not on CPAP due to insurance not covering  . Glaucoma   . BPH (benign prostatic hyperplasia)   . Hyperthyroidism 08/26/10    radioactive iodine therapy   . Multiple thyroid nodules   . Graves disease   . History of PFTs 05/2010    moderate airflow obstruction w reduced DLCO by PFT   . Mitral regurgitation echo 2015    mild  . MVP (mitral valve prolapse) 11/2012    posterior MVP  . Diastolic dysfunction   . Heart murmur, systolic   . Carotid artery stenosis     0-39% by dopplers 2015     Past Surgical History  Procedure Laterality Date  . Appendectomy    . Heart catherization    . Hernia repair  10/2009  . Cardiac catheterization       Current Outpatient Prescriptions  Medication Sig Dispense Refill  . amLODipine-atorvastatin (CADUET) 10-40 MG per tablet Take 1 tablet by mouth daily. 90 tablet 2  . aspirin 81 MG tablet Take 81 mg by mouth daily.    . Brinzolamide-Brimonidine (SIMBRINZA) 1-0.2 % SUSP Apply 1 drop to eye 2 (two) times daily.    Marland Kitchen doxazosin (CARDURA) 2 MG tablet Take 2 tablets (4 mg total) by mouth daily. 180 tablet 1  . ferrous sulfate 325 (65 FE) MG tablet Take 325 mg by mouth daily with breakfast.    . hydrALAZINE (APRESOLINE) 50 MG tablet Take 1 tablet (50 mg total) by mouth 3 (three) times daily. New dose 90 tablet 6  . levothyroxine (SYNTHROID, LEVOTHROID) 100 MCG tablet Take 100 mcg by mouth daily before breakfast.    . Multiple Vitamin (MULTIVITAMIN) tablet Take 1 tablet by mouth daily.      . nitroGLYCERIN (NITROSTAT) 0.4 MG SL tablet Place 0.4 mg under the tongue every 5 (five) minutes as needed for chest pain.  No current facility-administered medications for this visit.    Allergies:   Tiotropium bromide monohydrate; Budesonide-formoterol fumarate; Other; Tricor; and Zocor    Social History:  The patient  reports that he quit smoking about 30 years ago. His smoking use included Cigarettes. He has a 80 pack-year smoking history. He has never used smokeless tobacco. He reports that he does not drink alcohol or use illicit drugs.   Family History:  The patient's family history includes Colon cancer in his brother, maternal uncle, and mother; Hypertension in his father; Kidney failure in his father; Lung disease in his brother.    ROS:  Please see the history of present illness.   Otherwise, review of systems are positive for none.   All other systems are reviewed and negative.    PHYSICAL EXAM: VS:  BP 200/70 mmHg  Pulse 50  Ht 5'  9" (1.753 m)  Wt 170 lb (77.111 kg)  BMI 25.09 kg/m2 , BMI Body mass index is 25.09 kg/(m^2). GEN: Well nourished, well developed, in no acute distress HEENT: normal Neck: no JVD, carotid bruits, or masses Cardiac: RRR; no rubs, or gallops,no edema.  Mid systolic click with 2/6 SM at LLSB to apex Respiratory:  clear to auscultation bilaterally, normal work of breathing GI: soft, nontender, nondistended, + BS MS: no deformity or atrophy Skin: warm and dry, no rash Neuro:  Strength and sensation are intact Psych: euthymic mood, full affect   EKG:  EKG was ordered today and showed sinus bradycardia at 48bpm with no ST changes    Recent Labs: 12/04/2013: ALT 14 02/20/2014: BUN 39*; Creatinine 2.3*; Potassium 4.6; Sodium 136    Lipid Panel    Component Value Date/Time   CHOL 135 12/04/2013 0927   TRIG 37.0 12/04/2013 0927   HDL 62.80 12/04/2013 0927   CHOLHDL 2 12/04/2013 0927   VLDL 7.4 12/04/2013 0927   LDLCALC 65 12/04/2013 0927      Wt Readings from Last 3 Encounters:  06/25/14 170 lb (77.111 kg)  04/04/14 172 lb 12 oz (78.359 kg)  02/08/14 176 lb 12.8 oz (80.196 kg)    ASSESSMENT AND PLAN:  1. ASCAD with no angina but has had some scapular back pain and shoulder/left arm pain which does improve with movement of his shoulders.  This may be due to poorly controlled HTN, musculoskeletal etiology or could be anginal equivalent. i suspect this may be musculoskeletal with a radiculopathy given that he has had numbness of his 4th and 5th fingers in his left hand which is ulnar distribution.  He has not had a stress test since 2012 so I will get a Lexiscan myoview to rule out ischemia. I have asked him to call his PCP to get an appt for eval. - continue ASA 2. Asymptomatic Bradycardia 3. HTN - poorly controlled. - continue Caduet/doxazosin/Losartan/hydralazine  - check BMET  - increase Doxazosin to 6mg  qhs and have patient check BP daily  for a week and call with results.   4. Mild MR with posterior leaflet MVP - stable on echo 11/2013 with repeat scheduled in 1 year 5. Dyslipidemia - at goal with LDL 65 - continue Caduet   - check FLP and ALT 6. Moderate pulmonary HTN - repeat echo 11/2014 7. Very mild carotid artery stenosis     Current medicines are reviewed at length with the patient today.  The patient does not have concerns regarding medicines.  The following changes have been made:  no change  Labs/ tests  ordered today include: BMET/FLP/ALT  Orders Placed This Encounter  Procedures  . Basic Metabolic Panel (BMET)  . Hepatic function panel  . Lipid panel  . Myocardial Perfusion Imaging  . EKG 12-Lead     Disposition:   FU with me in 6 months   Signed, Sueanne Margarita, MD  06/25/2014 3:12 PM    [facility] [place], [place], [place]  [address] Phone: [phone]; Fax: [phone]

## 2014-06-25 ENCOUNTER — Ambulatory Visit (INDEPENDENT_AMBULATORY_CARE_PROVIDER_SITE_OTHER): Payer: Medicare Other | Admitting: Cardiology

## 2014-06-25 ENCOUNTER — Encounter: Payer: Self-pay | Admitting: Cardiology

## 2014-06-25 VITALS — BP 200/70 | HR 50 | Ht 69.0 in | Wt 170.0 lb

## 2014-06-25 DIAGNOSIS — I341 Nonrheumatic mitral (valve) prolapse: Secondary | ICD-10-CM

## 2014-06-25 DIAGNOSIS — IMO0002 Reserved for concepts with insufficient information to code with codable children: Secondary | ICD-10-CM

## 2014-06-25 DIAGNOSIS — I2510 Atherosclerotic heart disease of native coronary artery without angina pectoris: Secondary | ICD-10-CM

## 2014-06-25 DIAGNOSIS — I2583 Coronary atherosclerosis due to lipid rich plaque: Principal | ICD-10-CM

## 2014-06-25 DIAGNOSIS — I272 Other secondary pulmonary hypertension: Secondary | ICD-10-CM | POA: Diagnosis not present

## 2014-06-25 DIAGNOSIS — I10 Essential (primary) hypertension: Secondary | ICD-10-CM

## 2014-06-25 DIAGNOSIS — E785 Hyperlipidemia, unspecified: Secondary | ICD-10-CM

## 2014-06-25 MED ORDER — DOXAZOSIN MESYLATE 2 MG PO TABS: 6.0000 mg | ORAL_TABLET | Freq: Every day | ORAL | Status: DC

## 2014-06-25 NOTE — Patient Instructions (Signed)
Your physician has recommended you make the following change in your medication:  1) INCREASE DOXAZOSIN (CARDURA) to 6 mg daily  Your physician recommends that you return for FASTING lab work in: 1 week  Your physician has requested that you have a [place]. For further information please visit [email or web address]. Please follow instruction sheet, as given.  Your physician wants you to follow-up in: 6 months with Dr. Radford Pax. You will receive a reminder letter in the mail two months in advance. If you don't receive a letter, please call our office to schedule the follow-up appointment.

## 2014-06-25 NOTE — Addendum Note (Signed)
Addended by: Harland German A on: 06/25/2014 05:03 PM   Modules accepted: Orders

## 2014-06-27 ENCOUNTER — Other Ambulatory Visit: Payer: Federal, State, Local not specified - PPO

## 2014-06-27 DIAGNOSIS — M79602 Pain in left arm: Secondary | ICD-10-CM | POA: Diagnosis not present

## 2014-07-05 DIAGNOSIS — N183 Chronic kidney disease, stage 3 (moderate): Secondary | ICD-10-CM | POA: Diagnosis not present

## 2014-07-05 DIAGNOSIS — N2581 Secondary hyperparathyroidism of renal origin: Secondary | ICD-10-CM | POA: Diagnosis not present

## 2014-07-05 DIAGNOSIS — N189 Chronic kidney disease, unspecified: Secondary | ICD-10-CM | POA: Diagnosis not present

## 2014-07-05 DIAGNOSIS — D631 Anemia in chronic kidney disease: Secondary | ICD-10-CM | POA: Diagnosis not present

## 2014-07-08 ENCOUNTER — Telehealth: Payer: Self-pay

## 2014-07-08 ENCOUNTER — Other Ambulatory Visit (INDEPENDENT_AMBULATORY_CARE_PROVIDER_SITE_OTHER): Payer: Medicare Other

## 2014-07-08 ENCOUNTER — Ambulatory Visit (HOSPITAL_COMMUNITY): Payer: Medicare Other | Attending: Cardiology | Admitting: Radiology

## 2014-07-08 DIAGNOSIS — I2510 Atherosclerotic heart disease of native coronary artery without angina pectoris: Secondary | ICD-10-CM | POA: Insufficient documentation

## 2014-07-08 DIAGNOSIS — M549 Dorsalgia, unspecified: Secondary | ICD-10-CM | POA: Insufficient documentation

## 2014-07-08 DIAGNOSIS — M79602 Pain in left arm: Secondary | ICD-10-CM | POA: Insufficient documentation

## 2014-07-08 DIAGNOSIS — I10 Essential (primary) hypertension: Secondary | ICD-10-CM

## 2014-07-08 DIAGNOSIS — E119 Type 2 diabetes mellitus without complications: Secondary | ICD-10-CM | POA: Diagnosis not present

## 2014-07-08 DIAGNOSIS — E785 Hyperlipidemia, unspecified: Secondary | ICD-10-CM

## 2014-07-08 DIAGNOSIS — I2583 Coronary atherosclerosis due to lipid rich plaque: Secondary | ICD-10-CM

## 2014-07-08 MED ORDER — TECHNETIUM TC 99M SESTAMIBI GENERIC - CARDIOLITE
11.0000 | Freq: Once | INTRAVENOUS | Status: AC | PRN
  Administered 2014-07-08: 11 via INTRAVENOUS

## 2014-07-08 MED ORDER — REGADENOSON 0.4 MG/5ML IV SOLN
0.4000 mg | Freq: Once | INTRAVENOUS | Status: AC
  Administered 2014-07-08: 0.4 mg via INTRAVENOUS

## 2014-07-08 MED ORDER — TECHNETIUM TC 99M SESTAMIBI GENERIC - CARDIOLITE
33.0000 | Freq: Once | INTRAVENOUS | Status: AC | PRN
  Administered 2014-07-08: 33 via INTRAVENOUS

## 2014-07-08 NOTE — Telephone Encounter (Signed)
Increase Hydralazine to 75mg  TID and check BP daily for a week and call

## 2014-07-08 NOTE — Telephone Encounter (Signed)
VS log dropped off to office today:  3/29  0728  BP  155/66  HR  41 3/31  0947  BP  165/64  HR  45 4/1    0733  BP  160/68  HR  40 4/2    1032  BP  149/61  HR  42 4/3    0616  BP  162/66  HR  42 4/4    0851  BP  167/66  HR  43 4/5    0730  BP  165/68  HR  41 4/6    0733  BP  163/67  HR  38 4/7    0635  BP  167/69  HR  45 4/8    0547  BP  132/56  HR  42 4/10  0549  BP  135/61  HR  40 4/11  0715  BP  156/66  HR  43  To Dr. Radford Pax for review.

## 2014-07-08 NOTE — Progress Notes (Signed)
[facility] [address] [place], [place] [address] [phone]    Cardiology Nuclear Med Study  Jeffrey Campbell is a 81 y.o. male     MRN : [id number]     DOB: 03-18-1933  Procedure Date: 07/08/2014  Nuclear Med Background Indication for Stress Test:  Evaluation for Ischemia History:  CAD, MPI 2012 (normal) EF 59% Cardiac Risk Factors: Carotid Disease, Hypertension and NIDDM  Symptoms:  Left arm/shoulder/back pain   Nuclear Pre-Procedure Caffeine/Decaff Intake:  None NPO After: 8:00pm   Lungs:  clear O2 Sat: 97% on room air. IV 0.9% NS with Angio Cath:  22g  IV Site: R Antecubital  IV Started by:  Crissie Figures, RN  Chest Size (in):  40 Cup Size: N/A  Height: 5\' 9"  (1.753 m)  Weight:  168 lb (76.204 kg)  BMI:  Body mass index is 24.8 kg/(m^2). Tech Comments:  N/A    Nuclear Med Study 1 or 2 day study: 1 day  Stress Test Type:  Lexiscan  Reading MD: N/A  Order Authorizing Provider:  Fransico Him, MD  Resting Radionuclide: Technetium 38m Sestamibi  Resting Radionuclide Dose: 11.0 mCi   Stress Radionuclide:  Technetium 97m Sestamibi  Stress Radionuclide Dose: 33.0 mCi           Stress Protocol Rest HR: 48 Stress HR: 59  Rest BP: 183/72 Stress BP: 158/53  Exercise Time (min): n/a METS: n/a   Predicted Max HR: 141 bpm % Max HR: 41.84 bpm Rate Pressure Product: 9971   Dose of Adenosine (mg):  n/a Dose of Lexiscan: 0.4 mg  Dose of Atropine (mg): n/a Dose of Dobutamine: n/a mcg/kg/min (at max HR)  Stress Test Technologist: Glade Lloyd, BS-ES  Nuclear Technologist:  Earl Many, CNMT     Rest Procedure:  Myocardial perfusion imaging was performed at rest 45 minutes following the intravenous administration of Technetium 13m Sestamibi. Rest ECG: NSR - Normal EKG  Stress Procedure:  The patient received IV Lexiscan 0.4 mg over 15-seconds.  Technetium 26m Sestamibi injected at 30-seconds.  Quantitative spect images were obtained after a 45  minute delay.  During the infusion of Lexiscan the patient complained of feeling anxious.  This resolved in recovery.  Stress ECG: No significant change from baseline ECG  QPS Raw Data Images:  Normal; no motion artifact; normal heart/lung ratio. Stress Images:  Normal homogeneous uptake in all areas of the myocardium. Rest Images:  Normal homogeneous uptake in all areas of the myocardium. Subtraction (SDS):  No evidence of ischemia. Transient Ischemic Dilatation (Normal <1.22):  1.07 Lung/Heart Ratio (Normal <0.45):  0.36  Quantitative Gated Spect Images QGS EDV:  229 ml QGS ESV:  95 ml  Impression Exercise Capacity:  Lexiscan with no exercise. BP Response:  Normal blood pressure response. Clinical Symptoms:  No significant symptoms noted. ECG Impression:  No significant ECG changes with Lexiscan. Comparison with Prior Nuclear Study: No significant change from previous study  Overall Impression:  Normal stress nuclear study.  LV Ejection Fraction: 58%.  LV Wall Motion:  NL LV Function; NL Wall Motion   [name], MD, [facility] [phone] office [phone] pager

## 2014-07-09 ENCOUNTER — Other Ambulatory Visit: Payer: Federal, State, Local not specified - PPO

## 2014-07-09 ENCOUNTER — Ambulatory Visit: Payer: Medicare Other | Admitting: Cardiology

## 2014-07-09 MED ORDER — HYDRALAZINE HCL 25 MG PO TABS: 25.0000 mg | ORAL_TABLET | Freq: Three times a day (TID) | ORAL | Status: DC

## 2014-07-09 NOTE — Telephone Encounter (Signed)
Instructed patient to INCREASE HYDRALAZINE to 75 mg TID and to check BP daily for a week and call with results.  Patient agrees with treatment plan.

## 2014-07-15 ENCOUNTER — Encounter: Payer: Self-pay | Admitting: Cardiology

## 2014-07-15 ENCOUNTER — Other Ambulatory Visit (INDEPENDENT_AMBULATORY_CARE_PROVIDER_SITE_OTHER): Payer: Medicare Other

## 2014-07-15 DIAGNOSIS — I10 Essential (primary) hypertension: Secondary | ICD-10-CM

## 2014-07-15 DIAGNOSIS — I2510 Atherosclerotic heart disease of native coronary artery without angina pectoris: Secondary | ICD-10-CM

## 2014-07-15 DIAGNOSIS — E785 Hyperlipidemia, unspecified: Secondary | ICD-10-CM | POA: Diagnosis not present

## 2014-07-15 LAB — LIPID PANEL
CHOL/HDL RATIO: 2
CHOLESTEROL: 145 mg/dL (ref 0–200)
HDL: 63.1 mg/dL (ref >39.00)
LDL Cholesterol: 70 mg/dL (ref 0–99)
NonHDL: 81.9
TRIGLYCERIDES: 62 mg/dL (ref 0.0–149.0)
VLDL: 12.4 mg/dL (ref 0.0–40.0)

## 2014-07-15 LAB — HEPATIC FUNCTION PANEL
ALT: 17 U/L (ref 0–53)
AST: 23 U/L (ref 0–37)
Albumin: 4 g/dL (ref 3.5–5.2)
Alkaline Phosphatase: 68 U/L (ref 39–117)
Bilirubin, Direct: 0.2 mg/dL (ref 0.0–0.3)
Total Bilirubin: 1 mg/dL (ref 0.2–1.2)
Total Protein: 6.7 g/dL (ref 6.0–8.3)

## 2014-07-15 LAB — BASIC METABOLIC PANEL
BUN: 32 mg/dL — AB (ref 6–23)
CALCIUM: 9.6 mg/dL (ref 8.4–10.5)
CO2: 26 mEq/L (ref 19–32)
Chloride: 107 mEq/L (ref 96–112)
Creatinine, Ser: 2.19 mg/dL — ABNORMAL HIGH (ref 0.40–1.50)
GFR: 37.47 mL/min — AB (ref >60.00)
GLUCOSE: 120 mg/dL — AB (ref 70–99)
Potassium: 4 mEq/L (ref 3.5–5.1)
SODIUM: 137 meq/L (ref 135–145)

## 2014-07-15 NOTE — Telephone Encounter (Signed)
This encounter was created in error - please disregard.

## 2014-07-23 DIAGNOSIS — E890 Postprocedural hypothyroidism: Secondary | ICD-10-CM | POA: Diagnosis not present

## 2014-07-23 DIAGNOSIS — I10 Essential (primary) hypertension: Secondary | ICD-10-CM | POA: Diagnosis not present

## 2014-07-23 DIAGNOSIS — R001 Bradycardia, unspecified: Secondary | ICD-10-CM | POA: Diagnosis not present

## 2014-07-23 DIAGNOSIS — E0500 Thyrotoxicosis with diffuse goiter without thyrotoxic crisis or storm: Secondary | ICD-10-CM | POA: Diagnosis not present

## 2014-07-23 DIAGNOSIS — R0989 Other specified symptoms and signs involving the circulatory and respiratory systems: Secondary | ICD-10-CM | POA: Diagnosis not present

## 2014-08-08 DIAGNOSIS — H4011X1 Primary open-angle glaucoma, mild stage: Secondary | ICD-10-CM | POA: Diagnosis not present

## 2014-08-29 ENCOUNTER — Encounter: Payer: Self-pay | Admitting: Cardiology

## 2014-09-09 DIAGNOSIS — E0500 Thyrotoxicosis with diffuse goiter without thyrotoxic crisis or storm: Secondary | ICD-10-CM | POA: Diagnosis not present

## 2014-10-08 DIAGNOSIS — H4010X2 Unspecified open-angle glaucoma, moderate stage: Secondary | ICD-10-CM | POA: Diagnosis not present

## 2014-10-08 DIAGNOSIS — H34831 Tributary (branch) retinal vein occlusion, right eye: Secondary | ICD-10-CM | POA: Diagnosis not present

## 2014-10-08 DIAGNOSIS — H43812 Vitreous degeneration, left eye: Secondary | ICD-10-CM | POA: Diagnosis not present

## 2014-10-16 DIAGNOSIS — R634 Abnormal weight loss: Secondary | ICD-10-CM | POA: Diagnosis not present

## 2014-10-28 ENCOUNTER — Other Ambulatory Visit: Payer: Self-pay | Admitting: Internal Medicine

## 2014-10-30 ENCOUNTER — Encounter: Payer: Self-pay | Admitting: Cardiology

## 2014-11-07 DIAGNOSIS — H4011X2 Primary open-angle glaucoma, moderate stage: Secondary | ICD-10-CM | POA: Diagnosis not present

## 2014-11-28 DIAGNOSIS — E890 Postprocedural hypothyroidism: Secondary | ICD-10-CM | POA: Diagnosis not present

## 2015-01-07 ENCOUNTER — Ambulatory Visit (INDEPENDENT_AMBULATORY_CARE_PROVIDER_SITE_OTHER): Payer: Medicare Other | Admitting: Cardiology

## 2015-01-07 ENCOUNTER — Ambulatory Visit (INDEPENDENT_AMBULATORY_CARE_PROVIDER_SITE_OTHER): Payer: Medicare Other

## 2015-01-07 ENCOUNTER — Encounter: Payer: Self-pay | Admitting: Cardiology

## 2015-01-07 VITALS — BP 180/60 | HR 44 | Ht 69.0 in | Wt 153.6 lb

## 2015-01-07 DIAGNOSIS — I340 Nonrheumatic mitral (valve) insufficiency: Secondary | ICD-10-CM

## 2015-01-07 DIAGNOSIS — I272 Other secondary pulmonary hypertension: Secondary | ICD-10-CM

## 2015-01-07 DIAGNOSIS — IMO0002 Reserved for concepts with insufficient information to code with codable children: Secondary | ICD-10-CM

## 2015-01-07 DIAGNOSIS — R001 Bradycardia, unspecified: Secondary | ICD-10-CM | POA: Diagnosis not present

## 2015-01-07 DIAGNOSIS — I10 Essential (primary) hypertension: Secondary | ICD-10-CM | POA: Diagnosis not present

## 2015-01-07 DIAGNOSIS — I6523 Occlusion and stenosis of bilateral carotid arteries: Secondary | ICD-10-CM

## 2015-01-07 DIAGNOSIS — E785 Hyperlipidemia, unspecified: Secondary | ICD-10-CM

## 2015-01-07 DIAGNOSIS — I341 Nonrheumatic mitral (valve) prolapse: Secondary | ICD-10-CM

## 2015-01-07 DIAGNOSIS — I2510 Atherosclerotic heart disease of native coronary artery without angina pectoris: Secondary | ICD-10-CM | POA: Diagnosis not present

## 2015-01-07 DIAGNOSIS — I2583 Coronary atherosclerosis due to lipid rich plaque: Principal | ICD-10-CM

## 2015-01-07 DIAGNOSIS — R0989 Other specified symptoms and signs involving the circulatory and respiratory systems: Secondary | ICD-10-CM

## 2015-01-07 LAB — BASIC METABOLIC PANEL
BUN: 39 mg/dL — ABNORMAL HIGH (ref 7–25)
CHLORIDE: 108 mmol/L (ref 98–110)
CO2: 24 mmol/L (ref 20–31)
CREATININE: 2.51 mg/dL — AB (ref 0.70–1.18)
Calcium: 10 mg/dL (ref 8.6–10.3)
GLUCOSE: 104 mg/dL — AB (ref 65–99)
POTASSIUM: 3.9 mmol/L (ref 3.5–5.3)
Sodium: 141 mmol/L (ref 135–146)

## 2015-01-07 NOTE — Patient Instructions (Addendum)
Medication Instructions:  Your physician recommends that you continue on your current medications as directed. Please refer to the Current Medication list given to you today.   Labwork: TODAY : BMET  Testing/Procedures: Your physician has requested that you have an echocardiogram. Echocardiography is a painless test that uses sound waves to create images of your heart. It provides your doctor with information about the size and shape of your heart and how well your heart's chambers and valves are working. This procedure takes approximately one hour. There are no restrictions for this procedure.  Dr. Radford Pax recommends you have a RENAL DOPPLER.  Your physician has recommended that you wear a 24 hour holter monitor. Holter monitors are medical devices that record the heart's electrical activity. Doctors most often use these monitors to diagnose arrhythmias. Arrhythmias are problems with the speed or rhythm of the heartbeat. The monitor is a small, portable device. You can wear one while you do your normal daily activities. This is usually used to diagnose what is causing palpitations/syncope (passing out).   Follow-Up: Your physician wants you to follow-up in: 6 months with Dr. Radford Pax. You will receive a reminder letter in the mail two months in advance. If you don't receive a letter, please call our office to schedule the follow-up appointment.   Any Other Special Instructions Will Be Listed Below (If Applicable).

## 2015-01-07 NOTE — Progress Notes (Addendum)
Cardiology Office Note   Date:  01/07/2015   ID:  Jeffrey Campbell, DOB Nov 10, 1932, MRN [id number]  PCP:  Gerrit Heck, MD    Chief Complaint  Patient presents with  . Coronary Artery Disease  . Hypertension      History of Present Illness: Jeffrey Campbell is a 81 y.o. male with a history of ASCAD/HTN/dyslipidemia/mild MR with posterior MVP/bradycardia and moderate pulmonary HTN presents today for followup. He denies anychest pain, SOB, DOE, LE edema, palpitations or syncope. He occasionally has some dizziness when going from sitting to standing. His fatigue has improved with treatment of hypothyroidism.  Past Medical History  Diagnosis Date  . PUD (peptic ulcer disease)   . Hypertension   . Hyperlipemia   . CAD (coronary artery disease)   . Shutdown renal     after cardiac cath  . Organic impotence   . CKD (chronic kidney disease), stage III   . History of ETOH abuse   . Type 2 diabetes, diet controlled ([place])   . Pulmonary hypertension ([place]) echo 2015    moderate PASP 59mmHg  . Upper airway resistance syndrome   . OSA (obstructive sleep apnea)     upper airway resistance syndrome with RDI 18/hr - not on CPAP due to insurance not covering  . Glaucoma   . BPH (benign prostatic hyperplasia)   . Hyperthyroidism 08/26/10    radioactive iodine therapy   . Multiple thyroid nodules   . Graves disease   . History of PFTs 05/2010    moderate airflow obstruction w reduced DLCO by PFT   . Mitral regurgitation echo 2015    mild  . MVP (mitral valve prolapse) 11/2012    posterior MVP  . Diastolic dysfunction   . Heart murmur, systolic   . Carotid artery stenosis     0-39% by dopplers 2015    Past Surgical History  Procedure Laterality Date  . Appendectomy    . Heart catherization    . Hernia repair  10/2009  . Cardiac catheterization       Current Outpatient Prescriptions  Medication Sig Dispense Refill  . amLODipine-atorvastatin  (CADUET) 10-40 MG per tablet Take 1 tablet by mouth daily. 90 tablet 2  . aspirin 81 MG tablet Take 81 mg by mouth daily.    . Brinzolamide-Brimonidine (SIMBRINZA) 1-0.2 % SUSP Apply 1 drop to eye 2 (two) times daily.    Marland Kitchen doxazosin (CARDURA) 2 MG tablet Take 3 tablets (6 mg total) by mouth daily. 90 tablet 6  . hydrALAZINE (APRESOLINE) 25 MG tablet Take 1 tablet (25 mg total) by mouth 3 (three) times daily. Take with 50 mg hydralazine for total of 75 mg three times a day. 270 tablet 3  . hydrALAZINE (APRESOLINE) 50 MG tablet TAKE 1 TABLET (50 MG TOTAL) BY MOUTH 3 (THREE) TIMES DAILY. NEW DOSE 90 tablet 6  . levothyroxine (SYNTHROID, LEVOTHROID) 112 MCG tablet Take 112 mcg by mouth daily before breakfast.  5  . Multiple Vitamin (MULTIVITAMIN) tablet Take 1 tablet by mouth daily.      . nitroGLYCERIN (NITROSTAT) 0.4 MG SL tablet Place 0.4 mg under the tongue every 5 (five) minutes as needed for chest pain.      No current facility-administered medications for this visit.    Allergies:   Tiotropium bromide monohydrate; Budesonide-formoterol fumarate; Other; Tricor; and Zocor    Social History:  The  patient  reports that he quit smoking about 30 years ago. His smoking use included Cigarettes. He has a 80 pack-year smoking history. He has never used smokeless tobacco. He reports that he does not drink alcohol or use illicit drugs.   Family History:  The patient's family history includes Colon cancer in his brother, maternal uncle, and mother; Hypertension in his father; Kidney failure in his father; Lung disease in his brother.    ROS:  Please see the history of present illness.   Otherwise, review of systems are positive for none.   All other systems are reviewed and negative.    PHYSICAL EXAM: VS:  BP 180/60 mmHg  Pulse 44  Ht 5\' 9"  (1.753 m)  Wt 153 lb 9.6 oz (69.673 kg)  BMI 22.67 kg/m2  SpO2 98% , BMI Body mass index is 22.67 kg/(m^2). GEN: Well nourished, well developed, in no acute  distress HEENT: normal Neck: no JVD, carotid bruits, or masses Cardiac: RRR; no rubs, or gallops,no edema.  2/6 SM at LLSB to apex Respiratory:  clear to auscultation bilaterally, normal work of breathing GI: soft, nontender, nondistended, + BS.  Bilateral renal artery bruits MS: no deformity or atrophy Skin: warm and dry, no rash Neuro:  Strength and sensation are intact Psych: euthymic mood, full affect   EKG:  EKG was ordered today and showed marked sinus bradycardia at 41bpm with no ST changes, first degree AV block and LAE    Recent Labs: 07/15/2014: ALT 17; BUN 32*; Creatinine, Ser 2.19*; Potassium 4.0; Sodium 137    Lipid Panel    Component Value Date/Time   CHOL 145 07/15/2014 1338   TRIG 62.0 07/15/2014 1338   HDL 63.10 07/15/2014 1338   CHOLHDL 2 07/15/2014 1338   VLDL 12.4 07/15/2014 1338   LDLCALC 70 07/15/2014 1338      Wt Readings from Last 3 Encounters:  01/07/15 153 lb 9.6 oz (69.673 kg)  07/08/14 168 lb (76.204 kg)  06/25/14 170 lb (77.111 kg)     ASSESSMENT AND PLAN: 1.  ASCAD with no angina  - continue ASA 2.  Asymptomatic Bradycardia - his HR is 41 on EKG - I will get a 24 hour holter to assess average heart rate. 3.  HTN - BP elevated today.  At home his BP was 140's - continue Caduet/doxazosin/hydralazine  - check BMET -  I have asked him to check his BP daily for a week and call with the results.   4. Mild MR with posterior leaflet MVP - stable on echo 11/2013 - repeat echo 5. Dyslipidemia - at goal with LDL 70 - continue Caduet  6. Moderate pulmonary HTN - repeat echo 7. Very mild carotid artery stenosis  8.  Bilateral renal artery bruits - I will get renal duplex to rule out RAS.      Current medicines are reviewed at length with the patient today.  The patient does not have concerns regarding medicines.  The following changes have been made:  no change  Labs/ tests  ordered today: See above Assessment and Plan No orders of the defined types were placed in this encounter.     Disposition:   FU with me in 6 months  Signed, Sueanne Margarita, MD  01/07/2015 8:57 AM    [facility] [place], [place], [place]  [address] Phone: [phone]; Fax: [phone]

## 2015-01-08 NOTE — Addendum Note (Signed)
Addended by: Fransico Him R on: 01/08/2015 10:38 AM   Modules accepted: Miquel Dunn

## 2015-01-16 ENCOUNTER — Ambulatory Visit (HOSPITAL_COMMUNITY)
Admission: RE | Admit: 2015-01-16 | Discharge: 2015-01-16 | Disposition: A | Discharge status: Home / Self Care | Payer: Medicare Other | Source: Ambulatory Visit | Attending: Cardiology | Admitting: Cardiology

## 2015-01-16 ENCOUNTER — Ambulatory Visit (HOSPITAL_BASED_OUTPATIENT_CLINIC_OR_DEPARTMENT_OTHER): Payer: Medicare Other

## 2015-01-16 ENCOUNTER — Other Ambulatory Visit: Payer: Self-pay

## 2015-01-16 DIAGNOSIS — I341 Nonrheumatic mitral (valve) prolapse: Secondary | ICD-10-CM | POA: Diagnosis not present

## 2015-01-16 DIAGNOSIS — Z87891 Personal history of nicotine dependence: Secondary | ICD-10-CM | POA: Insufficient documentation

## 2015-01-16 DIAGNOSIS — I10 Essential (primary) hypertension: Secondary | ICD-10-CM | POA: Diagnosis not present

## 2015-01-16 DIAGNOSIS — I700 Atherosclerosis of aorta: Secondary | ICD-10-CM | POA: Insufficient documentation

## 2015-01-16 DIAGNOSIS — R0989 Other specified symptoms and signs involving the circulatory and respiratory systems: Secondary | ICD-10-CM | POA: Insufficient documentation

## 2015-01-16 DIAGNOSIS — E119 Type 2 diabetes mellitus without complications: Secondary | ICD-10-CM | POA: Insufficient documentation

## 2015-01-16 DIAGNOSIS — E785 Hyperlipidemia, unspecified: Secondary | ICD-10-CM | POA: Insufficient documentation

## 2015-01-16 DIAGNOSIS — I701 Atherosclerosis of renal artery: Secondary | ICD-10-CM | POA: Diagnosis not present

## 2015-01-16 DIAGNOSIS — I083 Combined rheumatic disorders of mitral, aortic and tricuspid valves: Secondary | ICD-10-CM | POA: Diagnosis not present

## 2015-01-16 DIAGNOSIS — K551 Chronic vascular disorders of intestine: Secondary | ICD-10-CM | POA: Diagnosis not present

## 2015-01-16 DIAGNOSIS — I2510 Atherosclerotic heart disease of native coronary artery without angina pectoris: Secondary | ICD-10-CM | POA: Diagnosis not present

## 2015-01-16 DIAGNOSIS — J449 Chronic obstructive pulmonary disease, unspecified: Secondary | ICD-10-CM | POA: Insufficient documentation

## 2015-01-16 DIAGNOSIS — I774 Celiac artery compression syndrome: Secondary | ICD-10-CM | POA: Diagnosis not present

## 2015-01-20 ENCOUNTER — Telehealth: Payer: Self-pay

## 2015-01-20 DIAGNOSIS — I5189 Other ill-defined heart diseases: Secondary | ICD-10-CM

## 2015-01-20 DIAGNOSIS — I272 Pulmonary hypertension, unspecified: Secondary | ICD-10-CM

## 2015-01-20 NOTE — Telephone Encounter (Signed)
Notes Recorded by Sueanne Margarita, MD on 01/17/2015 at 3:24 PM Echo showee normal LVF with mild LVH, mildly calcified and leaky AV, MVP of the Posterior MVL with mild MR, mildly dilated LA and RV and severe pulmonary HTN. Please get VQ scan to rule out PE and PFTs to assess for COPD as etiology of PUlm HTN. This is most likely secondary to pulmonary venous HTN from MVP/MR and grade 2 Diastolic dysfunction. Please have patient come in for a BNP to assess volume. Notes Recorded by Sueanne Margarita, MD on 01/20/2015 at 10:11 AM Heart monitor showed sinus bradycardia with average HR 44 bpm with occasional junctional rhythm and PVC's. Recent stress test with no ischemia and normal LVF on echo. Please set up patient for sleep study due to bradycardia and severe pulmonary HTN Notes Recorded by Sueanne Margarita, MD on 01/17/2015 at 3:20 PM Study showed Normal caliber abdominal aorta. 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 No evidence of hydronephrosis noted in both kidneys.The IVC and bilateral renal veins are patent. Please refer patient to Dr. Fletcher Anon for further evaluation   Informed patient of results and verbal understanding expressed.  VQ scan, PFTs, and BNP ordered for scheduling.  Patient refuses sleep study at this time. He does not snore, he sleeps well at night, and denies daytime sleepiness. (Per Dr. Bettina Gavia notes, patient underwent sleep study in 2012 and insurance would not pay for BiPAP so patient never started therapy).  Message sent to schedule consult with Dr. Fletcher Anon.

## 2015-01-20 NOTE — Telephone Encounter (Signed)
-----   Message from Sueanne Margarita, MD sent at 01/17/2015  3:24 PM EDT ----- Echo showee normal LVF with mild LVH, mildly calcified and leaky AV, MVP of the Posterior MVL with mild MR, mildly dilated LA and RV and severe pulmonary HTN.  Please get VQ scan to rule out PE and PFTs to assess for COPD as etiology of PUlm HTN.  This is most likely secondary to pulmonary venous HTN from MVP/MR and grade 2 Diastolic dysfunction.  Please have patient come in for a BNP to assess volume.

## 2015-01-20 NOTE — Addendum Note (Signed)
Addended by: Harland German A on: 01/20/2015 06:01 PM   Modules accepted: Orders

## 2015-01-23 ENCOUNTER — Telehealth: Payer: Self-pay

## 2015-01-23 ENCOUNTER — Other Ambulatory Visit: Payer: Medicare Other | Admitting: *Deleted

## 2015-01-23 DIAGNOSIS — I5032 Chronic diastolic (congestive) heart failure: Secondary | ICD-10-CM | POA: Diagnosis not present

## 2015-01-23 DIAGNOSIS — I272 Other secondary pulmonary hypertension: Secondary | ICD-10-CM | POA: Diagnosis not present

## 2015-01-23 LAB — BRAIN NATRIURETIC PEPTIDE: Brain Natriuretic Peptide: 620.8 pg/mL — ABNORMAL HIGH (ref 0.0–100.0)

## 2015-01-23 NOTE — Telephone Encounter (Signed)
-----   Message from Jeffrey Margarita, MD sent at 01/23/2015  4:30 PM EDT ----- BNP elevated but patient has chronic kidney disease.  Please refer to renal medicine.  Also, if not referred to Dr. Aundra Dubin for pulmonary HTN please refer

## 2015-01-23 NOTE — Telephone Encounter (Signed)
Patient is a patient of Dr. Justin Mend. Forwarded to [facility] and instructed patient to call tomorrow to make follow-up appointment. Patient agrees to referral to see Dr. Aundra Dubin and order placed.

## 2015-01-28 ENCOUNTER — Ambulatory Visit (HOSPITAL_COMMUNITY)
Admission: RE | Admit: 2015-01-28 | Discharge: 2015-01-28 | Disposition: A | Discharge status: Home / Self Care | Payer: Medicare Other | Source: Ambulatory Visit | Attending: Cardiology | Admitting: Cardiology

## 2015-01-28 ENCOUNTER — Telehealth: Payer: Self-pay

## 2015-01-28 DIAGNOSIS — R0602 Shortness of breath: Secondary | ICD-10-CM | POA: Diagnosis not present

## 2015-01-28 DIAGNOSIS — I272 Other secondary pulmonary hypertension: Secondary | ICD-10-CM | POA: Insufficient documentation

## 2015-01-28 DIAGNOSIS — I700 Atherosclerosis of aorta: Secondary | ICD-10-CM | POA: Diagnosis not present

## 2015-01-28 DIAGNOSIS — J439 Emphysema, unspecified: Secondary | ICD-10-CM | POA: Diagnosis not present

## 2015-01-28 DIAGNOSIS — I722 Aneurysm of renal artery: Secondary | ICD-10-CM | POA: Diagnosis not present

## 2015-01-28 LAB — PULMONARY FUNCTION TEST
DL/VA % PRED: 70 %
DL/VA: 3.22 ml/min/mmHg/L
DLCO UNC: 15.74 ml/min/mmHg
DLCO unc % pred: 48 %
FEF 25-75 Pre: 1.35 L/sec
FEF2575-%PRED-PRE: 65 %
FEV1-%Pred-Pre: 76 %
FEV1-Pre: 2.06 L
FEV1FVC-%Pred-Pre: 95 %
FEV6-%Pred-Pre: 81 %
FEV6-PRE: 2.82 L
FEV6FVC-%PRED-PRE: 103 %
FVC-%PRED-PRE: 78 %
FVC-PRE: 2.87 L
PRE FEV6/FVC RATIO: 98 %
Pre FEV1/FVC ratio: 72 %
RV % pred: 85 %
RV: 2.27 L
TLC % pred: 73 %
TLC: 5.18 L

## 2015-01-28 MED ORDER — TECHNETIUM TC 99M DIETHYLENETRIAME-PENTAACETIC ACID: 40.0000 | Freq: Once | INTRAVENOUS | Status: DC | PRN

## 2015-01-28 MED ORDER — TECHNETIUM TO 99M ALBUMIN AGGREGATED
4.0000 | Freq: Once | INTRAVENOUS | Status: AC | PRN
  Administered 2015-01-28: 4 via INTRAVENOUS

## 2015-01-28 NOTE — Telephone Encounter (Signed)
Informed patient of results and verbal understanding expressed.  Referral for Dr. Lamonte Sakai placed. Patient agrees with treatment plan.

## 2015-01-28 NOTE — Telephone Encounter (Signed)
-----   Message from Sueanne Margarita, MD sent at 01/28/2015  2:34 PM EDT ----- PFTs show moderate to severely reduced diffusing capacity c/w parenchymal pulmonary process.  Please refer to Dr. Lamonte Sakai for further evaluation given underlying significant pulmonary HTN

## 2015-01-31 ENCOUNTER — Other Ambulatory Visit: Payer: Self-pay | Admitting: Cardiology

## 2015-01-31 ENCOUNTER — Other Ambulatory Visit: Payer: Self-pay

## 2015-01-31 MED ORDER — AMLODIPINE-ATORVASTATIN 10-40 MG PO TABS: 1.0000 | ORAL_TABLET | Freq: Every day | ORAL | Status: DC

## 2015-02-06 DIAGNOSIS — H401131 Primary open-angle glaucoma, bilateral, mild stage: Secondary | ICD-10-CM | POA: Diagnosis not present

## 2015-02-10 ENCOUNTER — Encounter (HOSPITAL_COMMUNITY): Payer: Self-pay

## 2015-02-10 ENCOUNTER — Encounter (HOSPITAL_COMMUNITY): Payer: Self-pay | Admitting: *Deleted

## 2015-02-10 ENCOUNTER — Telehealth (HOSPITAL_COMMUNITY): Payer: Self-pay | Admitting: *Deleted

## 2015-02-10 ENCOUNTER — Telehealth (HOSPITAL_COMMUNITY): Payer: Self-pay | Admitting: Cardiology

## 2015-02-10 ENCOUNTER — Ambulatory Visit (HOSPITAL_COMMUNITY)
Admission: RE | Admit: 2015-02-10 | Discharge: 2015-02-10 | Disposition: A | Discharge status: Home / Self Care | Payer: Medicare Other | Source: Ambulatory Visit | Attending: Cardiology | Admitting: Cardiology

## 2015-02-10 VITALS — BP 168/60 | HR 44 | Wt 154.5 lb

## 2015-02-10 DIAGNOSIS — Z79899 Other long term (current) drug therapy: Secondary | ICD-10-CM | POA: Diagnosis not present

## 2015-02-10 DIAGNOSIS — E1122 Type 2 diabetes mellitus with diabetic chronic kidney disease: Secondary | ICD-10-CM | POA: Insufficient documentation

## 2015-02-10 DIAGNOSIS — G4733 Obstructive sleep apnea (adult) (pediatric): Secondary | ICD-10-CM | POA: Insufficient documentation

## 2015-02-10 DIAGNOSIS — I272 Other secondary pulmonary hypertension: Secondary | ICD-10-CM | POA: Diagnosis not present

## 2015-02-10 DIAGNOSIS — E890 Postprocedural hypothyroidism: Secondary | ICD-10-CM | POA: Insufficient documentation

## 2015-02-10 DIAGNOSIS — Z7982 Long term (current) use of aspirin: Secondary | ICD-10-CM | POA: Insufficient documentation

## 2015-02-10 DIAGNOSIS — I130 Hypertensive heart and chronic kidney disease with heart failure and stage 1 through stage 4 chronic kidney disease, or unspecified chronic kidney disease: Secondary | ICD-10-CM | POA: Insufficient documentation

## 2015-02-10 DIAGNOSIS — N183 Chronic kidney disease, stage 3 unspecified: Secondary | ICD-10-CM

## 2015-02-10 DIAGNOSIS — Z841 Family history of disorders of kidney and ureter: Secondary | ICD-10-CM | POA: Insufficient documentation

## 2015-02-10 DIAGNOSIS — I5032 Chronic diastolic (congestive) heart failure: Secondary | ICD-10-CM | POA: Diagnosis not present

## 2015-02-10 DIAGNOSIS — Z8249 Family history of ischemic heart disease and other diseases of the circulatory system: Secondary | ICD-10-CM | POA: Diagnosis not present

## 2015-02-10 DIAGNOSIS — I2510 Atherosclerotic heart disease of native coronary artery without angina pectoris: Secondary | ICD-10-CM | POA: Diagnosis not present

## 2015-02-10 DIAGNOSIS — Z87891 Personal history of nicotine dependence: Secondary | ICD-10-CM | POA: Diagnosis not present

## 2015-02-10 DIAGNOSIS — R001 Bradycardia, unspecified: Secondary | ICD-10-CM | POA: Insufficient documentation

## 2015-02-10 DIAGNOSIS — E785 Hyperlipidemia, unspecified: Secondary | ICD-10-CM | POA: Diagnosis not present

## 2015-02-10 LAB — BASIC METABOLIC PANEL
ANION GAP: 7 (ref 5–15)
BUN: 35 mg/dL — ABNORMAL HIGH (ref 6–20)
CHLORIDE: 112 mmol/L — AB (ref 101–111)
CO2: 21 mmol/L — AB (ref 22–32)
Calcium: 9.7 mg/dL (ref 8.9–10.3)
Creatinine, Ser: 2.49 mg/dL — ABNORMAL HIGH (ref 0.61–1.24)
GFR calc non Af Amer: 23 mL/min — ABNORMAL LOW (ref >60)
GFR, EST AFRICAN AMERICAN: 27 mL/min — AB (ref >60)
Glucose, Bld: 112 mg/dL — ABNORMAL HIGH (ref 65–99)
POTASSIUM: 4.2 mmol/L (ref 3.5–5.1)
Sodium: 140 mmol/L (ref 135–145)

## 2015-02-10 LAB — CBC
HEMATOCRIT: 33.5 % — AB (ref 39.0–52.0)
HEMOGLOBIN: 11 g/dL — AB (ref 13.0–17.0)
MCH: 31.3 pg (ref 26.0–34.0)
MCHC: 32.8 g/dL (ref 30.0–36.0)
MCV: 95.2 fL (ref 78.0–100.0)
Platelets: 167 10*3/uL (ref 150–400)
RBC: 3.52 MIL/uL — ABNORMAL LOW (ref 4.22–5.81)
RDW: 14.6 % (ref 11.5–15.5)
WBC: 5.1 10*3/uL (ref 4.0–10.5)

## 2015-02-10 LAB — PROTIME-INR
INR: 1.14 (ref 0.00–1.49)
Prothrombin Time: 14.8 seconds (ref 11.6–15.2)

## 2015-02-10 MED ORDER — POTASSIUM CHLORIDE CRYS ER 10 MEQ PO TBCR: 10.0000 meq | EXTENDED_RELEASE_TABLET | Freq: Once | ORAL | Status: DC

## 2015-02-10 MED ORDER — FUROSEMIDE 40 MG PO TABS: 40.0000 mg | ORAL_TABLET | Freq: Every day | ORAL | Status: DC

## 2015-02-10 NOTE — Patient Instructions (Signed)
FOLLOW UP: 2 weeks after right heart cath  START Lasix 40mg  daily  START Potassium 14meq daily.

## 2015-02-10 NOTE — Progress Notes (Signed)
Patient ID: Jeffrey Campbell, male   DOB: July 27, 1932, 81 y.o.   MRN: [id number] PCP: Dr. Drema [place] Cardiology: Dr Radford Pax HF Cardiology: Dr. Aundra Dubin  81 yo with history of CAD, PAD, CKD, sinus bradycardia, and pulmonary HTN by echo presents for CHF clinic evaluation.  Patient had an echo in 10/16 showing normal LV EF, but moderately dilated RV with PASP 68 mmHg.  He reports no chest pain.  He is able to do most of what he wants without dyspnea.  He notes some shortness of breath walking up a hill.  He is able to push mow his yard and rake leaves without noticeable dyspnea.  No lightheadedness or syncope.  No orthopnea or PND.  No tachypalpitations.    He has had an extensive workup. PFTs showed mild obstruction but moderate to severely decreased DLCO.  V/Q scan was negative for evidence of acute or chronic PE.  He had had a CT chest back in 2/16 that showed no definitive evidence for interstitial lung disease.    Patient is bradycardic with HR in 40s.  This seems to be asymptomatic.  Recent holter showed average HR 44 bpm.   Rather diffuse moderate CAD on LHC in 2012 but no intervention.  Cardiolite in 4/16 showed no ischemia or infarction.   BP high today.  He has bilateral renal artery stenosis by renal artery dopplers in 10/16.  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  No claudication or intestinal angina.  He has been referred to Dr. Fletcher Anon.    Labs (4/16): LDL 70 Labs (10/16): BNP 621, K 3.9, creatinine 2.51  PMH: 1. CAD: LHC (2012) with 50-60% ostial LAD, 50-60% mLAD, 60% pRCA.  Cardiolite (4/16) with EF 58%, no ischemia or infarction.  2. [place] 3. HTN 4. Hyperlipidemia 5. Graves disease treated with RAI, now hypothyroid. 6. PUD 7. CKD III-IV 8. Type II diabetes 9. OSA: Not using CPAP 10. Sinus bradycardia: Holter (10/16) with average HR 44, occasional junctional rhythm.  11. Mitral valve prolapse.  12. Chronic diastolic CHF: Echo (123XX123) with EF 60-65%,  grade II diastolic dysfunction, mild AI, MVP with mild MR, moderate RV dilation with normal systolic function, PASP 68 mmHg.   13. 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  14. Pulmonary hypertension: PASP 68 mmHg on 10/16 echo.  PFTs (11/16) with FEV1 76%, FVC 78%, ratio 95%, TLC 73%, DLCO 48% => mild obstruction with moderate to severely decreased DLCO.  CT chest (2/16) without definitive interstitial lung disease.  V/Q scan 11/16 with no perfusion defects.  15. Carotid dopplers (10/16) with mild disease.   SH: Married, lives in [place], retired Scientist, research (medical), heavy smoker previously but quit in 1980s.    FH: HTN, CKD.  ROS: All systems reviewed and negative except as per HPI.   Current Outpatient Prescriptions  Medication Sig Dispense Refill  . amLODipine-atorvastatin (CADUET) 10-40 MG tablet Take 1 tablet by mouth daily. 90 tablet 3  . aspirin 81 MG tablet Take 81 mg by mouth daily.    . Brinzolamide-Brimonidine (SIMBRINZA) 1-0.2 % SUSP Apply 1 drop to eye 2 (two) times daily.    Marland Kitchen doxazosin (CARDURA) 2 MG tablet Take 3 tablets (6 mg total) by mouth daily. 90 tablet 6  . hydrALAZINE (APRESOLINE) 25 MG tablet Take 1 tablet (25 mg total) by mouth 3 (three) times daily. Take with 50 mg hydralazine for total  of 75 mg three times a day. 270 tablet 3  . hydrALAZINE (APRESOLINE) 50 MG tablet TAKE 1 TABLET (50 MG TOTAL) BY MOUTH 3 (THREE) TIMES DAILY. NEW DOSE 90 tablet 6  . levothyroxine (SYNTHROID, LEVOTHROID) 112 MCG tablet Take 112 mcg by mouth daily before breakfast.  5  . Multiple Vitamin (MULTIVITAMIN) tablet Take 1 tablet by mouth daily.      . furosemide (LASIX) 40 MG tablet Take 1 tablet (40 mg total) by mouth daily. 30 tablet 3  . nitroGLYCERIN (NITROSTAT) 0.4 MG SL tablet Place 0.4 mg under the tongue every 5 (five) minutes as needed for chest pain.     . potassium chloride  (K-DUR,KLOR-CON) 10 MEQ tablet Take 1 tablet (10 mEq total) by mouth once. 30 tablet 3   No current facility-administered medications for this encounter.   BP 168/60 mmHg  Pulse 44  Wt 154 lb 8 oz (70.081 kg)  SpO2 97% General: NAD Neck: JVP 8-9 cm, no thyromegaly or thyroid nodule.  Lungs: Clear to auscultation bilaterally with normal respiratory effort. CV: Nondisplaced PMI.  Heart regular S1/S2, no S3/S4, 2/6 SEM RUSB.  1+ edema to knees bilaterally.  Left carotid bruit.  Normal pedal pulses.  Abdomen: Soft, nontender, no hepatosplenomegaly, no distention.  Skin: Intact without lesions or rashes.  Neurologic: Alert and oriented x 3.  Psych: Normal affect. Extremities: No clubbing or cyanosis.  HEENT: Normal.   Assessment/Plan: 1. Pulmonary hypertension: Noted by echo with PASP 68 mmHg and moderately dilated RV.  PFTs showed mild obstruction with moderate to severely decreased DLCO, suggesting a pulmonary vascular problem.  CT chest in 2/16 did not show definitive ILD.  V/Q scan did not show evidence for acute or chronic PE.  He carries a history of OSA but is not using CPAP.  Cannot rule out group I pulmonary HTN, but I suspect that this is most likely primarily group 2 pulmonary hypertension (pulmonary venous hypertension in the setting of elevated left heart filling pressure).   - I will arrange for RHC to assess for PAH versus pulmonary venous hypertension versus mixed.  2. Chronic diastolic CHF: Patient does have some volume overload on exam though his symptoms are only NYHA class II.  As above, diastolic LV failure versus RV failure alone in the setting of PAH.  I suspect this is most likely diastolic LV failure with pulmonary venous HTN.  CKD stage III-IV increases tendency to retain fluid.  - I will arrange for RHC.  I discussed this with the patient and he understands risks/benefits of the procedure. He agrees to proceed.   - Start Lasix 40 mg daily + KCl 10 daily.  BMET in 10  days.  3. OSA: He does not want a sleep study and is not interested in CPAP.  4. CKD: Stage III-IV.  Watch creatinine closely with initiation of Lasix.  5. HTN: Suspect bilateral renal artery stenosis plays a role.  Volume overload in the setting of renal dysfunction also can raise BP, so Lasix may help bring down BP.   6. PAD: Renal artery stenosis, mesenteric artery stenosis, distal aortic stenosis.  He has been referred to Dr Fletcher Anon.  7. CAD: Stable with no chest pain.  Continue ASA 81 and statin. Goal LDL < 70.  4/16 Cardiolite with no ischemia.  8. Sinus bradycardia: HR in 40s at rest . Asympatomatic.  Follow for now.    Loralie Champagne 02/10/2015

## 2015-02-10 NOTE — Telephone Encounter (Signed)
Pt scheduled for [facility] with [place] code [phone] With patients current insurance- Medicare/Supplement BCBS No pre cert is required

## 2015-02-10 NOTE — Telephone Encounter (Signed)
Per pt meds were sent to wrong pharmacy, he needs them sent to [facility] on [address], meds sent in pt aware

## 2015-02-10 NOTE — Progress Notes (Signed)
Advanced Heart Failure Medication Review by a Pharmacist  Does the patient  feel that his/her medications are working for him/her?  yes  Has the patient been experiencing any side effects to the medications prescribed?  no  Does the patient measure his/her own blood pressure or blood glucose at home?  no   Does the patient have any problems obtaining medications due to transportation or finances?   no  Understanding of regimen: good Understanding of indications: good Potential of compliance: good Patient understands to avoid NSAIDs. Patient understands to avoid decongestants.  Issues to address at subsequent visits: None   Pharmacist comments:  Jeffrey Campbell is a pleasant 81 yo M presenting with a current medication list. He reports good compliance with his medications but states that he seldom misses his evening doses d/t falling asleep early. He did not have any specific medication-related questions or concerns for me at this time.   Ruta Hinds. Velva Harman, PharmD, BCPS, CPP Clinical Pharmacist Pager: [phone] Phone: [phone] 02/10/2015 11:43 AM      Time with patient: 6 minutes Preparation and documentation time: 2 minutes Total time: 8 minutes

## 2015-02-17 ENCOUNTER — Encounter (HOSPITAL_COMMUNITY)
Admission: RE | Disposition: A | Discharge status: Home / Self Care | Payer: Medicare Other | Source: Ambulatory Visit | Attending: Cardiology

## 2015-02-17 ENCOUNTER — Encounter (HOSPITAL_COMMUNITY): Payer: Self-pay | Admitting: Cardiology

## 2015-02-17 ENCOUNTER — Ambulatory Visit (HOSPITAL_COMMUNITY)
Admission: RE | Admit: 2015-02-17 | Discharge: 2015-02-17 | Disposition: A | Discharge status: Home / Self Care | Payer: Medicare Other | Source: Ambulatory Visit | Attending: Cardiology | Admitting: Cardiology

## 2015-02-17 DIAGNOSIS — I341 Nonrheumatic mitral (valve) prolapse: Secondary | ICD-10-CM | POA: Diagnosis not present

## 2015-02-17 DIAGNOSIS — E1151 Type 2 diabetes mellitus with diabetic peripheral angiopathy without gangrene: Secondary | ICD-10-CM | POA: Insufficient documentation

## 2015-02-17 DIAGNOSIS — Z7982 Long term (current) use of aspirin: Secondary | ICD-10-CM | POA: Diagnosis not present

## 2015-02-17 DIAGNOSIS — E039 Hypothyroidism, unspecified: Secondary | ICD-10-CM | POA: Diagnosis not present

## 2015-02-17 DIAGNOSIS — I272 Other secondary pulmonary hypertension: Secondary | ICD-10-CM | POA: Insufficient documentation

## 2015-02-17 DIAGNOSIS — Z87891 Personal history of nicotine dependence: Secondary | ICD-10-CM | POA: Diagnosis not present

## 2015-02-17 DIAGNOSIS — E1122 Type 2 diabetes mellitus with diabetic chronic kidney disease: Secondary | ICD-10-CM | POA: Diagnosis not present

## 2015-02-17 DIAGNOSIS — I701 Atherosclerosis of renal artery: Secondary | ICD-10-CM | POA: Diagnosis not present

## 2015-02-17 DIAGNOSIS — I130 Hypertensive heart and chronic kidney disease with heart failure and stage 1 through stage 4 chronic kidney disease, or unspecified chronic kidney disease: Secondary | ICD-10-CM | POA: Insufficient documentation

## 2015-02-17 DIAGNOSIS — I270 Primary pulmonary hypertension: Secondary | ICD-10-CM | POA: Diagnosis not present

## 2015-02-17 DIAGNOSIS — R001 Bradycardia, unspecified: Secondary | ICD-10-CM | POA: Insufficient documentation

## 2015-02-17 DIAGNOSIS — I2510 Atherosclerotic heart disease of native coronary artery without angina pectoris: Secondary | ICD-10-CM | POA: Diagnosis not present

## 2015-02-17 DIAGNOSIS — N183 Chronic kidney disease, stage 3 (moderate): Secondary | ICD-10-CM | POA: Insufficient documentation

## 2015-02-17 DIAGNOSIS — G4733 Obstructive sleep apnea (adult) (pediatric): Secondary | ICD-10-CM | POA: Diagnosis not present

## 2015-02-17 DIAGNOSIS — E785 Hyperlipidemia, unspecified: Secondary | ICD-10-CM | POA: Insufficient documentation

## 2015-02-17 DIAGNOSIS — I5032 Chronic diastolic (congestive) heart failure: Secondary | ICD-10-CM | POA: Insufficient documentation

## 2015-02-17 HISTORY — PX: CARDIAC CATHETERIZATION: SHX172

## 2015-02-17 LAB — POCT I-STAT 3, VENOUS BLOOD GAS (G3P V)
ACID-BASE DEFICIT: 5 mmol/L — AB (ref 0.0–2.0)
BICARBONATE: 19.5 meq/L — AB (ref 20.0–24.0)
O2 Saturation: 71 %
TCO2: 21 mmol/L (ref 0–100)
pCO2, Ven: 34.9 mmHg — ABNORMAL LOW (ref 45.0–50.0)
pH, Ven: 7.355 — ABNORMAL HIGH (ref 7.250–7.300)
pO2, Ven: 38 mmHg (ref 30.0–45.0)

## 2015-02-17 SURGERY — RIGHT HEART CATH

## 2015-02-17 MED ORDER — FENTANYL CITRATE (PF) 100 MCG/2ML IJ SOLN
INTRAMUSCULAR | Status: AC
  Filled 2015-02-17: qty 2

## 2015-02-17 MED ORDER — SODIUM CHLORIDE 0.9 % IJ SOLN: 3.0000 mL | INTRAMUSCULAR | Status: DC | PRN

## 2015-02-17 MED ORDER — HEPARIN (PORCINE) IN NACL 2-0.9 UNIT/ML-% IJ SOLN
INTRAMUSCULAR | Status: AC
  Filled 2015-02-17: qty 500

## 2015-02-17 MED ORDER — ASPIRIN 81 MG PO CHEW
81.0000 mg | CHEWABLE_TABLET | ORAL | Status: AC
  Administered 2015-02-17: 81 mg via ORAL

## 2015-02-17 MED ORDER — HYDRALAZINE HCL 20 MG/ML IJ SOLN
INTRAMUSCULAR | Status: DC | PRN
  Administered 2015-02-17 (×2): 10 mg via INTRAVENOUS

## 2015-02-17 MED ORDER — SODIUM CHLORIDE 0.9 % IJ SOLN: 3.0000 mL | Freq: Two times a day (BID) | INTRAMUSCULAR | Status: DC

## 2015-02-17 MED ORDER — SODIUM CHLORIDE 0.9 % IV SOLN: 250.0000 mL | INTRAVENOUS | Status: DC | PRN

## 2015-02-17 MED ORDER — MIDAZOLAM HCL 2 MG/2ML IJ SOLN
INTRAMUSCULAR | Status: AC
  Filled 2015-02-17: qty 2

## 2015-02-17 MED ORDER — FENTANYL CITRATE (PF) 100 MCG/2ML IJ SOLN
INTRAMUSCULAR | Status: DC | PRN
  Administered 2015-02-17: 25 ug via INTRAVENOUS

## 2015-02-17 MED ORDER — ASPIRIN 81 MG PO CHEW
CHEWABLE_TABLET | ORAL | Status: AC
  Administered 2015-02-17: 81 mg via ORAL
  Filled 2015-02-17: qty 1

## 2015-02-17 MED ORDER — ONDANSETRON HCL 4 MG/2ML IJ SOLN: 4.0000 mg | Freq: Four times a day (QID) | INTRAMUSCULAR | Status: DC | PRN

## 2015-02-17 MED ORDER — HYDRALAZINE HCL 20 MG/ML IJ SOLN
INTRAMUSCULAR | Status: AC
  Filled 2015-02-17: qty 1

## 2015-02-17 MED ORDER — MIDAZOLAM HCL 2 MG/2ML IJ SOLN
INTRAMUSCULAR | Status: DC | PRN
  Administered 2015-02-17 (×2): 1 mg via INTRAVENOUS

## 2015-02-17 MED ORDER — SODIUM CHLORIDE 0.9 % IV SOLN
INTRAVENOUS | Status: DC
  Administered 2015-02-17: 07:00:00 via INTRAVENOUS

## 2015-02-17 MED ORDER — LIDOCAINE HCL (PF) 1 % IJ SOLN
INTRAMUSCULAR | Status: AC
  Filled 2015-02-17: qty 30

## 2015-02-17 MED ORDER — ACETAMINOPHEN 325 MG PO TABS
650.0000 mg | ORAL_TABLET | ORAL | Status: DC | PRN
Start: 2015-02-17 — End: 2015-02-17

## 2015-02-17 SURGICAL SUPPLY — 5 items
CATH BALLN WEDGE 5F 110CM (CATHETERS) ×3 IMPLANT
KIT HEART RIGHT NAMIC (KITS) ×3 IMPLANT
PACK CARDIAC CATHETERIZATION (CUSTOM PROCEDURE TRAY) ×3 IMPLANT
SHEATH FAST CATH BRACH 5F 5CM (SHEATH) ×3 IMPLANT
TRANSDUCER W/STOPCOCK (MISCELLANEOUS) ×4 IMPLANT

## 2015-02-17 NOTE — H&P (View-Only) (Signed)
Patient ID: Jeffrey Campbell, male   DOB: November 07, 1932, 81 y.o.   MRN: [id number] PCP: Dr. Drema [place] Cardiology: Dr Radford Pax HF Cardiology: Dr. Aundra Dubin  81 yo with history of CAD, PAD, CKD, sinus bradycardia, and pulmonary HTN by echo presents for CHF clinic evaluation.  Patient had an echo in 10/16 showing normal LV EF, but moderately dilated RV with PASP 68 mmHg.  He reports no chest pain.  He is able to do most of what he wants without dyspnea.  He notes some shortness of breath walking up a hill.  He is able to push mow his yard and rake leaves without noticeable dyspnea.  No lightheadedness or syncope.  No orthopnea or PND.  No tachypalpitations.    He has had an extensive workup. PFTs showed mild obstruction but moderate to severely decreased DLCO.  V/Q scan was negative for evidence of acute or chronic PE.  He had had a CT chest back in 2/16 that showed no definitive evidence for interstitial lung disease.    Patient is bradycardic with HR in 40s.  This seems to be asymptomatic.  Recent holter showed average HR 44 bpm.   Rather diffuse moderate CAD on LHC in 2012 but no intervention.  Cardiolite in 4/16 showed no ischemia or infarction.   BP high today.  He has bilateral renal artery stenosis by renal artery dopplers in 10/16.  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  No claudication or intestinal angina.  He has been referred to Dr. Fletcher Anon.    Labs (4/16): LDL 70 Labs (10/16): BNP 621, K 3.9, creatinine 2.51  PMH: 1. CAD: LHC (2012) with 50-60% ostial LAD, 50-60% mLAD, 60% pRCA.  Cardiolite (4/16) with EF 58%, no ischemia or infarction.  2. [place] 3. HTN 4. Hyperlipidemia 5. Graves disease treated with RAI, now hypothyroid. 6. PUD 7. CKD III-IV 8. Type II diabetes 9. OSA: Not using CPAP 10. Sinus bradycardia: Holter (10/16) with average HR 44, occasional junctional rhythm.  11. Mitral valve prolapse.  12. Chronic diastolic CHF: Echo (123XX123) with EF 60-65%,  grade II diastolic dysfunction, mild AI, MVP with mild MR, moderate RV dilation with normal systolic function, PASP 68 mmHg.   13. 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  14. Pulmonary hypertension: PASP 68 mmHg on 10/16 echo.  PFTs (11/16) with FEV1 76%, FVC 78%, ratio 95%, TLC 73%, DLCO 48% => mild obstruction with moderate to severely decreased DLCO.  CT chest (2/16) without definitive interstitial lung disease.  V/Q scan 11/16 with no perfusion defects.  15. Carotid dopplers (10/16) with mild disease.   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    FH: HTN, CKD.  ROS: All systems reviewed and negative except as per HPI.   Current Outpatient Prescriptions  Medication Sig Dispense Refill  . amLODipine-atorvastatin (CADUET) 10-40 MG tablet Take 1 tablet by mouth daily. 90 tablet 3  . aspirin 81 MG tablet Take 81 mg by mouth daily.    . Brinzolamide-Brimonidine (SIMBRINZA) 1-0.2 % SUSP Apply 1 drop to eye 2 (two) times daily.    Marland Kitchen doxazosin (CARDURA) 2 MG tablet Take 3 tablets (6 mg total) by mouth daily. 90 tablet 6  . hydrALAZINE (APRESOLINE) 25 MG tablet Take 1 tablet (25 mg total) by mouth 3 (three) times daily. Take with 50 mg hydralazine for total  of 75 mg three times a day. 270 tablet 3  . hydrALAZINE (APRESOLINE) 50 MG tablet TAKE 1 TABLET (50 MG TOTAL) BY MOUTH 3 (THREE) TIMES DAILY. NEW DOSE 90 tablet 6  . levothyroxine (SYNTHROID, LEVOTHROID) 112 MCG tablet Take 112 mcg by mouth daily before breakfast.  5  . Multiple Vitamin (MULTIVITAMIN) tablet Take 1 tablet by mouth daily.      . furosemide (LASIX) 40 MG tablet Take 1 tablet (40 mg total) by mouth daily. 30 tablet 3  . nitroGLYCERIN (NITROSTAT) 0.4 MG SL tablet Place 0.4 mg under the tongue every 5 (five) minutes as needed for chest pain.     . potassium chloride  (K-DUR,KLOR-CON) 10 MEQ tablet Take 1 tablet (10 mEq total) by mouth once. 30 tablet 3   No current facility-administered medications for this encounter.   BP 168/60 mmHg  Pulse 44  Wt 154 lb 8 oz (70.081 kg)  SpO2 97% General: NAD Neck: JVP 8-9 cm, no thyromegaly or thyroid nodule.  Lungs: Clear to auscultation bilaterally with normal respiratory effort. CV: Nondisplaced PMI.  Heart regular S1/S2, no S3/S4, 2/6 SEM RUSB.  1+ edema to knees bilaterally.  Left carotid bruit.  Normal pedal pulses.  Abdomen: Soft, nontender, no hepatosplenomegaly, no distention.  Skin: Intact without lesions or rashes.  Neurologic: Alert and oriented x 3.  Psych: Normal affect. Extremities: No clubbing or cyanosis.  HEENT: Normal.   Assessment/Plan: 1. Pulmonary hypertension: Noted by echo with PASP 68 mmHg and moderately dilated RV.  PFTs showed mild obstruction with moderate to severely decreased DLCO, suggesting a pulmonary vascular problem.  CT chest in 2/16 did not show definitive ILD.  V/Q scan did not show evidence for acute or chronic PE.  He carries a history of OSA but is not using CPAP.  Cannot rule out group I pulmonary HTN, but I suspect that this is most likely primarily group 2 pulmonary hypertension (pulmonary venous hypertension in the setting of elevated left heart filling pressure).   - I will arrange for RHC to assess for PAH versus pulmonary venous hypertension versus mixed.  2. Chronic diastolic CHF: Patient does have some volume overload on exam though his symptoms are only NYHA class II.  As above, diastolic LV failure versus RV failure alone in the setting of PAH.  I suspect this is most likely diastolic LV failure with pulmonary venous HTN.  CKD stage III-IV increases tendency to retain fluid.  - I will arrange for RHC.  I discussed this with the patient and he understands risks/benefits of the procedure. He agrees to proceed.   - Start Lasix 40 mg daily + KCl 10 daily.  BMET in 10  days.  3. OSA: He does not want a sleep study and is not interested in CPAP.  4. CKD: Stage III-IV.  Watch creatinine closely with initiation of Lasix.  5. HTN: Suspect bilateral renal artery stenosis plays a role.  Volume overload in the setting of renal dysfunction also can raise BP, so Lasix may help bring down BP.   6. PAD: Renal artery stenosis, mesenteric artery stenosis, distal aortic stenosis.  He has been referred to Dr Fletcher Anon.  7. CAD: Stable with no chest pain.  Continue ASA 81 and statin. Goal LDL < 70.  4/16 Cardiolite with no ischemia.  8. Sinus bradycardia: HR in 40s at rest . Asympatomatic.  Follow for now.    Loralie Champagne 02/10/2015

## 2015-02-17 NOTE — Discharge Instructions (Signed)
angiogramAngiogram, Care After Refer to this sheet in the next few weeks. These instructions provide you with information about caring for yourself after your procedure. Your health care provider may also give you more specific instructions. Your treatment has been planned according to current medical practices, but problems sometimes occur. Call your health care provider if you have any problems or questions after your procedure. WHAT TO EXPECT AFTER THE PROCEDURE After your procedure, it is typical to have the following:  Bruising at the catheter insertion site that usually fades within 1-2 weeks.  Blood collecting in the tissue (hematoma) that may be painful to the touch. It should usually decrease in size and tenderness within 1-2 weeks. HOME CARE INSTRUCTIONS  Take medicines only as directed by your health care provider.  You may shower 24-48 hours after the procedure or as directed by your health care provider. Remove the bandage (dressing) and gently wash the site with plain soap and water. Pat the area dry with a clean towel. Do not rub the site, because this may cause bleeding.  Do not take baths, swim, or use a hot tub until your health care provider approves.  Check your insertion site every day for redness, swelling, or drainage.  Do not apply powder or lotion to the site.  Do not lift over 10 lb (4.5 kg) for 5 days after your procedure or as directed by your health care provider.  Ask your health care provider when it is okay to:  Return to work or school.  Resume usual physical activities or sports.  Resume sexual activity.  Do not drive home if you are discharged the same day as the procedure. Have someone else drive you.  You may drive 24 hours after the procedure unless otherwise instructed by your health care provider.  Do not operate machinery or power tools for 24 hours after the procedure or as directed by your health care provider.  If your procedure was done  as an outpatient procedure, which means that you went home the same day as your procedure, a responsible adult should be with you for the first 24 hours after you arrive home.  Keep all follow-up visits as directed by your health care provider. This is important. SEEK MEDICAL CARE IF:  You have a fever.  You have chills.  You have increased bleeding from the catheter insertion site. Hold pressure on the site. SEEK IMMEDIATE MEDICAL CARE IF:  You have unusual pain at the catheter insertion site.  You have redness, warmth, or swelling at the catheter insertion site.  You have drainage (other than a small amount of blood on the dressing) from the catheter insertion site.  The catheter insertion site is bleeding, and the bleeding does not stop after 30 minutes of holding steady pressure on the site.  The area near or just beyond the catheter insertion site becomes pale, cool, tingly, or numb.   This information is not intended to replace advice given to you by your health care provider. Make sure you discuss any questions you have with your health care provider.   Document Released: 10/01/2004 Document Revised: 04/05/2014 Document Reviewed: 08/16/2012 Elsevier Interactive Patient Education [employer].

## 2015-02-17 NOTE — Interval H&P Note (Signed)
History and Physical Interval Note:  02/17/2015 7:49 AM  Jeffrey Campbell  has presented today for surgery, with the diagnosis of hf  The various methods of treatment have been discussed with the patient and family. After consideration of risks, benefits and other options for treatment, the patient has consented to  Procedure(s): Right Heart Cath (N/A) as a surgical intervention .  The patient's history has been reviewed, patient examined, no change in status, stable for surgery.  I have reviewed the patient's chart and labs.  Questions were answered to the patient's satisfaction.     Dalton [employer]

## 2015-02-18 ENCOUNTER — Ambulatory Visit (INDEPENDENT_AMBULATORY_CARE_PROVIDER_SITE_OTHER): Payer: Medicare Other | Admitting: Cardiovascular Disease

## 2015-02-18 ENCOUNTER — Encounter: Payer: Self-pay | Admitting: Cardiovascular Disease

## 2015-02-18 VITALS — BP 162/72 | HR 45 | Ht 69.0 in | Wt 144.2 lb

## 2015-02-18 DIAGNOSIS — I10 Essential (primary) hypertension: Secondary | ICD-10-CM

## 2015-02-18 DIAGNOSIS — I701 Atherosclerosis of renal artery: Secondary | ICD-10-CM | POA: Diagnosis not present

## 2015-02-18 DIAGNOSIS — I2510 Atherosclerotic heart disease of native coronary artery without angina pectoris: Secondary | ICD-10-CM | POA: Diagnosis not present

## 2015-02-18 MED FILL — Heparin Sodium (Porcine) 2 Unit/ML in Sodium Chloride 0.9%: INTRAMUSCULAR | Qty: 500 | Status: AC

## 2015-02-18 MED FILL — Lidocaine HCl Local Preservative Free (PF) Inj 1%: INTRAMUSCULAR | Qty: 30 | Status: AC

## 2015-02-18 NOTE — Assessment & Plan Note (Addendum)
His blood pressure is chronically uncontrolled. Not able to use a beta blocker due to chronic bradycardia. Consider increasing the dose of hydralazine.

## 2015-02-18 NOTE — Patient Instructions (Signed)
Medication Instructions:  Your physician recommends that you continue on your current medications as directed. Please refer to the Current Medication list given to you today.  Labwork: No new orders.   Testing/Procedures: No new orders.   Follow-Up: Your physician recommends that you schedule a follow-up appointment in: 1 MONTH with Dr Arida   Any Other Special Instructions Will Be Listed Below (If Applicable).     If you need a refill on your cardiac medications before your next appointment, please call your pharmacy.   

## 2015-02-18 NOTE — Progress Notes (Signed)
Primary cardiologist: Dr. Radford Pax Nephrologist: Dr. Justin Mend  HPI  This is a pleasant 81 year old male who was referred by Dr. Radford Pax for evaluation of bilateral renal artery stenosis. The patient has known history of moderate nonobstructive coronary artery disease on previous cardiac catheterization in 2002. He had renal angiography done at the same time which showed 70% bilateral stenosis of his renal arteries. He has known history of refractory hypertension, hyperlipidemia, pulmonary hypertension and chronic kidney disease with a most recent creatinine around 2.5. Creatinine 5 years ago was 1.6 and 2 years ago was 1.8. He was noted recently to have abdominal bruits. He underwent renal artery duplex which showed severe bilateral renal artery stenosis. There was also greater than 50% stenosis in the distal aorta. He had right heart catheterization done yesterday by Dr. Aundra Dubin which showed mild pulmonary venous hypertension with prominent V waves. The patient's blood pressure has been difficult to control. He denies any chest pain.  Allergies  Allergen Reactions  . Tiotropium Bromide Monohydrate Other (See Comments)    Dry mouth  . Budesonide-Formoterol Fumarate     Pain in muscles, cramps in hands and legs  . Other     DYE- Causes kidney to shut down  . Tricor [Fenofibrate]     Unknown   . Zocor [Simvastatin]     Liver problems      Current Outpatient Prescriptions on File Prior to Visit  Medication Sig Dispense Refill  . amLODipine-atorvastatin (CADUET) 10-40 MG tablet Take 1 tablet by mouth daily. 90 tablet 3  . aspirin 81 MG tablet Take 81 mg by mouth daily.    . Brinzolamide-Brimonidine (SIMBRINZA) 1-0.2 % SUSP Apply 1 drop to eye 2 (two) times daily.    Marland Kitchen doxazosin (CARDURA) 2 MG tablet Take 3 tablets (6 mg total) by mouth daily. 90 tablet 6  . hydrALAZINE (APRESOLINE) 25 MG tablet Take 1 tablet (25 mg total) by mouth 3 (three) times daily. Take with 50 mg hydralazine for total of 75  mg three times a day. 270 tablet 3  . hydrALAZINE (APRESOLINE) 50 MG tablet TAKE 1 TABLET (50 MG TOTAL) BY MOUTH 3 (THREE) TIMES DAILY. NEW DOSE 90 tablet 6  . levothyroxine (SYNTHROID, LEVOTHROID) 112 MCG tablet Take 112 mcg by mouth daily before breakfast.  5  . Multiple Vitamin (MULTIVITAMIN) tablet Take 1 tablet by mouth daily.      . nitroGLYCERIN (NITROSTAT) 0.4 MG SL tablet Place 0.4 mg under the tongue every 5 (five) minutes as needed for chest pain.     . potassium chloride (K-DUR,KLOR-CON) 10 MEQ tablet Take 1 tablet (10 mEq total) by mouth once. (Patient taking differently: Take 10 mEq by mouth daily. ) 30 tablet 3   No current facility-administered medications on file prior to visit.     Past Medical History  Diagnosis Date  . PUD (peptic ulcer disease)   . Hypertension   . Hyperlipemia   . CAD (coronary artery disease)   . Shutdown renal     after cardiac cath  . Organic impotence   . CKD (chronic kidney disease), stage III   . History of ETOH abuse   . Type 2 diabetes, diet controlled ([place])   . Pulmonary hypertension ([place]) echo 2015    moderate PASP 28mmHg  . Upper airway resistance syndrome   . OSA (obstructive sleep apnea)     upper airway resistance syndrome with RDI 18/hr - not on CPAP due to insurance not covering  . Glaucoma   .  BPH (benign prostatic hyperplasia)   . Hyperthyroidism 08/26/10    radioactive iodine therapy   . Multiple thyroid nodules   . Graves disease   . History of PFTs 05/2010    moderate airflow obstruction w reduced DLCO by PFT   . Mitral regurgitation echo 2015    mild  . MVP (mitral valve prolapse) 11/2012    posterior MVP  . Diastolic dysfunction   . Heart murmur, systolic   . Carotid artery stenosis     0-39% by dopplers 2015     Past Surgical History  Procedure Laterality Date  . Appendectomy    . Heart catherization    . Hernia repair  10/2009  . Cardiac catheterization    . Cardiac catheterization N/A 02/17/2015     Procedure: Right Heart Cath;  Surgeon: Larey Dresser, MD;  Location: [facility];  Service: Cardiovascular;  Laterality: N/A;     Family History  Problem Relation Age of Onset  . Kidney failure Father   . Hypertension Father   . Colon cancer Mother   . Colon cancer Brother   . Lung disease Brother   . Colon cancer Maternal Uncle      Social History   Social History  . Marital Status: Married    Spouse Name: N/A  . Number of Children: 4  . Years of Education: N/A   Occupational History  . Retired     [employer]   Social History Main Topics  . Smoking status: Former Smoker -- 2.00 packs/day for 40 years    Types: Cigarettes    Quit date: 03/29/1984  . Smokeless tobacco: Never Used  . Alcohol Use: No     Comment: quit in 1981  . Drug Use: No  . Sexual Activity: Not on file   Other Topics Concern  . Not on file   Social History Narrative     ROS A 10 point review of system was performed. It is negative other than that mentioned in the history of present illness.   PHYSICAL EXAM   BP 162/72 mmHg  Pulse 45  Ht 5\' 9"  (1.753 m)  Wt 144 lb 3.2 oz (65.409 kg)  BMI 21.29 kg/m2  SpO2 99% Constitutional: He is oriented to person, place, and time. He appears well-developed and well-nourished. No distress.  HENT: No nasal discharge.  Head: Normocephalic and atraumatic.  Eyes: Pupils are equal and round.  No discharge. Neck: Normal range of motion. Neck supple. No JVD present. No thyromegaly present.  Cardiovascular: Normal rate, regular rhythm, normal heart sounds. Exam reveals no gallop and no friction rub. No murmur heard.  Pulmonary/Chest: Effort normal and breath sounds normal. No stridor. No respiratory distress. He has no wheezes. He has no rales. He exhibits no tenderness.  Abdominal: Soft. Bowel sounds are normal. He exhibits no distension. There is no tenderness. There is no rebound and no guarding.  Musculoskeletal: Normal range of motion.  He exhibits no edema and no tenderness.  Neurological: He is alert and oriented to person, place, and time. Coordination normal.  Skin: Skin is warm and dry. No rash noted. He is not diaphoretic. No erythema. No pallor.  Psychiatric: He has a normal mood and affect. His behavior is normal. Judgment and thought content normal.  Vascular: Femoral pulses are normal. Distal pulses are normal. An abdominal bruits noted bilaterally.     EKG:   ASSESSMENT AND PLAN

## 2015-02-18 NOTE — Assessment & Plan Note (Signed)
The patient has 2 indications for revascularization including progressive chronic kidney disease as well as refractory hypertension. Obviously, revascularization will carry the risk of contrast-induced nephropathy as well as embolization given significant disease in the aorta. I discussed these issues with him today. Angiography will have to be done with CO2 with minimal contrast. He is going to follow-up with Dr.Webb next week and wants to discuss this with him as well. I am going to forward minor today. Otherwise, the patient will follow-up in one month.

## 2015-02-18 NOTE — Assessment & Plan Note (Signed)
He denies chest pain. He appears to be stable.

## 2015-02-19 ENCOUNTER — Ambulatory Visit (INDEPENDENT_AMBULATORY_CARE_PROVIDER_SITE_OTHER): Payer: Medicare Other | Admitting: Pulmonary Disease

## 2015-02-19 ENCOUNTER — Encounter: Payer: Self-pay | Admitting: Pulmonary Disease

## 2015-02-19 VITALS — BP 150/64 | HR 54 | Temp 98.1°F | Ht 69.0 in | Wt 143.8 lb

## 2015-02-19 DIAGNOSIS — R0600 Dyspnea, unspecified: Secondary | ICD-10-CM | POA: Diagnosis not present

## 2015-02-19 DIAGNOSIS — I701 Atherosclerosis of renal artery: Secondary | ICD-10-CM | POA: Diagnosis not present

## 2015-02-19 NOTE — Progress Notes (Signed)
Subjective:     Patient ID: Jeffrey Campbell, male   DOB: [birth year], 81 y.o.   MRN: [id number]  HPI 81 y/o gentleman referred by his Cardiologist & PCP Earle Gell, for a pulmonary evaluation>  He has a signif cardiac hx w/ HBP, bilat RAS, PAD, CAD, CKD w/ Cr=2.5; and Hx OSA, ex-smoker, & evid of pulmHTN by 2DEcho... ~  I have reviewed his extensive old records in EPIC>>   ~  09/21/10:  Pulmonary consult w/ PW>   86 y.o.AAM, pulm HTN on echo. Saw PCP ? Heart murmur. Then Echo done and pulm HTN. Then Sleep study done, insurance would not pay for Bipap. This therapy not yet started. Airflow obstruction on PFTs No smoking in 27yr. Since 88 no smoking. Prior to this was a heavy smoker. No real wheezing. Pt notes some dyspnea since had heart eval. Now is noting dyspnea and leg edema. No real cough. No real chest tightness. CXR mod copd changes SArlyce Harman Mod airflow obstruction Echo: reviewed: mod pulm HTN    Imp>  Moderate COPD with airflow obstruction as cause for pulm htn in addition to sleep apnea.    Plan>  F/u cpap therapy per dr tRadford Pax  Start spiriva.  ~  Subsequent follow up visits in 2012=>2013:    Did not get cpap. Insurance would not approve , notes dry mouth and no change on spiriva. No real cough. Phlegm is thick and sticky. Mouth real dry. Awakens with nose irritated.    Imp>  Golds stage III COPD with moderate pulmonary hypertension; Significant side effects with Spiriva including dry mouth and excess secretions    Plan>  Stop Spiriva; Start Symbicort 160 two puff twice daily=> but pt ret complaining ofpain in back, muscle cramps in hands, neck and legs. Patient states started the time he started taking Symbicort. He has read PI in symbiocrt and is convinced the symbicort is causing his symptoms . Also concerned it may lead to cataracts therefore stopped the ics/laba too & he says symptoms resolved. Intolerant to inhalers, side effects to spiriva and symbicort.Pulm function in  moderate category but pt focused on side effects of inhalers and states his dyspnea is tolerable.  ~  12/21/11:  Pulmonary f/u w/ PW>   No changes in dyspnea since last OV-Dr Turner wanted another OV to reassess pulm status relative to the pulm HTN seen on echo 11/30/11.  Pt denies any new pulm complaints. Pt denies any significant sore throat, nasal congestion or excess secretions, fever, chills, sweats, unintended weight loss, pleurtic or exertional chest pain, orthopnea PND, or leg swelling Pt denies any increase in rescue therapy over baseline, denies waking up needing it or having any early am or nocturnal exacerbations of coughing/wheezing/or dyspnea. Pt also denies any obvious fluctuation in symptoms with weather or environmental change or other alleviating or aggravating factors    Imp>  Golds B COPD with mod pulm HTN PAP 45-55 est on echo D/t diastolic dysfunction EF 765%  CAT 2 12/21/2011;  PFTs 05/2010: FeV1 72% FVC 76% FeV1/FVC 75% DLCO 50%  The pulmonary hypertension appears to be on the basis of diastolic dysfunction with EF elevated. Left atrial pressure elevation There is no primary pulmonary cause of the pts pulmonary hypertension.COPD is mild and has been nonresponsive to bronchodilators. The sleep apnea is mild and not requiring CPAP.    Plan>  No further pulmonary workup or treatment indicated.Return to pulmonary prn.    [facility]  [facility] & function w/ EF=68%, norm wall motion, no DD, modMR, scleroticAoV w/ mildAI, modLA dil, norm RV size & function w/ est RVSP=65-72mHg, modTR...   Sleep Study 06/17/10 by DrTTurner showed mild OSA w/ RDI=19/hr, there was very little stage4/ REM sleep, mod snoring, no desaturations, no limb movements, EKG showed sinus brady (47-60).  CPAP titration was done 07/29/10> due to continued resp events he was titrated up to 13/9 cmH2O w/ an AHI=1  [facility] (76%), FEV1=2.01 (72%), %1sec=70,  mid-flows reduced at 52%predicted;  DLCO recorded at 50% predicted... Spirometry is c/w mild airflow obstruction & small airways dis w/ mod-severe decr DLCO ?etiology...   CXR 05/29/10 showed norm heart size, aortic elongation, clear lungs w/ some mild peripheral attenuation, NAD...  V/Q lung scan 05/29/10 was neg w/ normal ventilation and perfusion (done for c/o CP & SOB)  CTAngio Chest 12/04/12 showed no evid of pulm emboli, norm heart size, atherosclerotic calcif in LAD & circ, 350mRLL nodule, small bilat effusions...   CT Chest 05/02/14 showed coronary calcif & atherosclerotic changes in Ao, small bilat effusions, 45m99mLL nodule w/o change, new 4mm345mL nodule, atx vs min airsp opac RUL area...   CXR 01/28/15 showed borderline heart size, tortuous Ao, mild hyperinflation and coarse interstitial markings...    ~  February 19, 2015:  Pulmonary evaluation by SN>   Prev hx reviewed & above data abstracted... I reviewed the findings w/ Jeffrey Campbell & formulated the following problem list >       Hx OSA>  Sleep eval by DrTTurner in 2012- notes reviewed; they indicate that insurance wouldn't cover his BiPAP (per titration study 07/29/10) but pt indicates that it was because he couldn't tolerate the mask interface;  Currently he notes that he does NOT snore, claims to rest well (except for occas nocturnal leg cramps, wakes refreshed most days, denies daytime sleep pressure, and rarely naps; not interested in repeat sleep eval at this time...      Ex-smoker w/ very mild obstructive lung dis>  He denies cough, notes min sput- whitish w/o hemoptysis, denies SOB but does admit to mild DOE "on a hill" eg- mows yard & does ok if flat, he thinks that Lasix helped this;  He was prev tried on inhalers- Symbicort & Spiriva but he refused to use them either because of what he read in the pharm hand-out or some perceived side effect to the spray... Note: he started smoking at age12 & was up to 1ppd in early 20s, later increased to  2-3ppd (but states he just burned them up), smoking about 30 yrs and [id number], for a ?60-70 pack-yr smoking hx?      Abn CT Chest w/ atherosclerotic dis and 2 small pulm nodules>  As noted in the CT scans- the 45mm 78m nodule is unchanged from 9/14 to 2/16 scan, the 4mm L49mnodule was new on the 2/16 scan & this should be repeated in 42yr (i55yr/2017)...      Mild pulmonary hypertension>  DrMcLeans excellent note of 02/10/15 outlines this prob & it's extensive work up very nicely;  His prev 2DEchocardiograms have seemed to overestimate his PAsys number (PASP=68 on Echo 10/16), [facility] showed PA 45/9 mean24 and he feels that pt fits best into WHO group 2 (pulmonary venous hypertension in the setting of elevated left heart filling pressure);  There is no evid of pulm embolic dis and his COPD & OSA appear to  be very mild & I doubt contributing much to this equation...       Decreased DLCO on Full PFTs>  This is the least understood finding on his studies to date- ?why the persistently decr DLCO ~50% predicted?  He has smoked enough to have emphysema but I would expect more airflow obstructive dis, and the CT Chest did not indicate a signif interstitial process;  I would suggest considering a Hi-res CT Chest early in 2017 when his yearly f/u CT is due (to f/u the 8m LLL nodule)...  EXAM shows Afeb, VSS (BP=150/64), O2sat=98% on RA;  HEENT- neg, mallampati2;  Chest- clear w/o w/r/r;  Heart- RR, brady at 54/min, gr2/6 SEM w/o r/g;  Abd- soft, neg;  Ext- tr edema, no c/c...  2DEcho 01/16/15 showed mild conc LVH, norm LVF w/ EF=60-65%, norm wall motion, Gr2 DD, mild calcif AoV w/ mild AI, mod MVP post leaflet w/ mildMR, mild LA dil & mod RA dil, mod RV dil w/ severe pulmHTN (PAsys est~677mg.  LAB 01/23/15 showed BNP=621 w/ Cr=2.5 and hehas f/u appt w/ Nephrology pending (last seen 06/2014)  CXR 01/28/15 showed borderline heart size, tortuous Ao, mild hyperinflation and coarse  interstitial markings...   [facility] [place] DrMcLean showed RV= 43/6, PA= 45/9 mean 24, PCWP= mean 13 & v-wave to 33  Spirometry 02/19/15 showed FVC=3.21 (91%), FEV1=2.32 (88%), %1sec=72%, mid-flows reduced at 61% predicted...  Ambulatory oxygen saturation test 02/19/15> O2sat=99% on RA at rest w/ pulse=54.min; he ambulated 3 laps in the office w/ lowest O2sat=95% w/ pulse=55/min...  IMP/PLAN>>  He is relatively asymptomatic from the pulmonary standpoint & he is NOT inclined to use inhalers etc;  Neither is he inclined to pursue repeat OSA testing, CPAP/BiPAP, etc;  He feels that he is improved on the Lasix20; he needs a f/u CT Chest in ~Feb2017 & I would suggest a Hi-Res CT Chest at that time... I told him we'd be happy to see him back at any time, or have [facility]/ [facility].    Past Medical History >> his PCP is Dr. ElLucia GaskinsDiagnosis            On ASA, Caduet, Cardura, Hydralazine, Lasix, Potassium Date  . PUD (peptic ulcer disease)   . Hypertension w/ bilat renal art stenosis >> PV eval by DrArida 01/2015    ASPVD w/ distal aortic disease   . Hyperlipemia >> on Caduet10-40   . CAD (coronary artery disease) >> his primary Cardiologist is DrTTurner   . Shutdown renal     after cardiac cath  . Organic impotence   . CKD (chronic kidney disease), stage III >> his Nephrologist is DrWebb Cr=2.5 (01/2015)  . History of ETOH abuse   . Type 2 diabetes, diet controlled ([place]  . Pulmonary hypertension (HCC) >> [facility]    moderate PASP 4067m  . Upper airway resistance syndrome   . OSA (obstructive sleep apnea) >> Sleep Study & CPAP titration 2012 by DrTurner     upper airway resistance syndrome with RDI 18/hr - not on CPAP due to insurance not covering  . Glaucoma   . BPH (benign prostatic hyperplasia)   . Hyperthyroidism >> his Endocrinologist is DrKerr; on Synthroid112 08/26/10    radioactive iodine therapy   . Multiple thyroid  nodules   . Graves disease   . History of PFTs 05/2010    moderate airflow obstruction w reduced DLCO by PFT   .  Mitral regurgitation echo 2015    mild  . MVP (mitral valve prolapse) 11/2012    posterior MVP  . Diastolic dysfunction >> [facility]    . Heart murmur, systolic   . Carotid artery stenosis     0-39% by dopplers 2015    Past Surgical History  Procedure Laterality Date  . Appendectomy    . Heart catherization    . Hernia repair  10/2009  . Cardiac catheterization    . Cardiac catheterization N/A 02/17/2015    Procedure: Right Heart Cath;  Surgeon: Larey Dresser, MD;  Location: [facility];  Service: Cardiovascular;  Laterality: N/A;    Outpatient Encounter Prescriptions as of 02/19/2015  Medication Sig  . amLODipine-atorvastatin (CADUET) 10-40 MG tablet Take 1 tablet by mouth daily.  Marland Kitchen aspirin 81 MG tablet Take 81 mg by mouth daily.  . Brinzolamide-Brimonidine (SIMBRINZA) 1-0.2 % SUSP Apply 1 drop to eye 2 (two) times daily.  Marland Kitchen doxazosin (CARDURA) 2 MG tablet Take 3 tablets (6 mg total) by mouth daily.  . furosemide (LASIX) 20 MG tablet   . hydrALAZINE (APRESOLINE) 25 MG tablet Take 1 tablet (25 mg total) by mouth 3 (three) times daily. Take with 50 mg hydralazine for total of 75 mg three times a day.  . hydrALAZINE (APRESOLINE) 50 MG tablet TAKE 1 TABLET (50 MG TOTAL) BY MOUTH 3 (THREE) TIMES DAILY. NEW DOSE  . levothyroxine (SYNTHROID, LEVOTHROID) 112 MCG tablet Take 112 mcg by mouth daily before breakfast.  . Multiple Vitamin (MULTIVITAMIN) tablet Take 1 tablet by mouth daily.    . nitroGLYCERIN (NITROSTAT) 0.4 MG SL tablet Place 0.4 mg under the tongue every 5 (five) minutes as needed for chest pain.   . potassium chloride (K-DUR,KLOR-CON) 10 MEQ tablet Take 1 tablet (10 mEq total) by mouth once. (Patient taking differently: Take 10 mEq by mouth daily. )   No facility-administered encounter medications on file as of 02/19/2015.     Allergies  Allergen Reactions  . Tiotropium Bromide Monohydrate Other (See Comments)    Dry mouth  . Budesonide-Formoterol Fumarate     Pain in muscles, cramps in hands and legs  . Other     DYE- Causes kidney to shut down  . Tricor [Fenofibrate]     Unknown   . Zocor [Simvastatin]     Liver problems     Immunization History  Administered Date(s) Administered  . Influenza Whole 01/26/2011  . Influenza,inj,Quad PF,36+ Mos 02/08/2014  . Pneumococcal Polysaccharide-23 02/25/2009  He is not sure of his immuniz status & we do not have notes from his PCP>     He is asked to check w/ DrBarnes about this- ?needs 2016 Flu shot and needs Prevnar-13 if not prev admin.   Family History  Problem Relation Age of Onset  . Kidney failure Father   . Hypertension Father   . Colon cancer Mother   . Colon cancer Brother   . Lung disease Brother   . Colon cancer Maternal Uncle     Social History   Social History  . Marital Status: Married    Spouse Name: N/A  . Number of Children: 4  . Years of Education: N/A   Occupational History  . Retired     [employer]   Social History Main Topics  . Smoking status: Former Smoker -- 2.00 packs/day for 40 years    Types: Cigarettes    Quit date: 03/29/1984  .  Smokeless tobacco: Never Used  . Alcohol Use: No     Comment: quit in 1981  . Drug Use: No  . Sexual Activity: Not on file   Other Topics Concern  . Not on file   Social History Narrative    Current Medications, Allergies, Past Medical History, Past Surgical History, Family History, and Social History were reviewed in [employer] record.   Review of Systems             All symptoms NEG except where BOLDED >>  Constitutional:  F/C/S, fatigue, anorexia, unexpected weight change. HEENT:  HA, visual changes, hearing loss, earache, nasal symptoms, sore throat, mouth sores, hoarseness. Resp:  cough, sputum, hemoptysis; SOB, tightness,  wheezing. Cardio:  CP, palpit, DOE, orthopnea, edema. GI:  N/V/D/C, blood in stool; reflux, abd pain, distention, gas. GU:  dysuria, freq, urgency, hematuria, flank pain, voiding difficulty. MS:  joint pain, swelling, tenderness, decr ROM; neck pain, back pain, etc. Neuro:  HA, tremors, seizures, dizziness, syncope, weakness, numbness, gait abn. Skin:  suspicious lesions or skin rash. Heme:  adenopathy, bruising, bleeding. Psyche:  confusion, agitation, sleep disturbance, hallucinations, anxiety, depression suicidal.   Objective:   Physical Exam       Vital Signs:  Reviewed...  General:  WD, sl thin, 81 y/o gentleman in NAD; alert & oriented; pleasant & cooperative... HEENT:  [place]/AT; Conjunctiva- pink, Sclera- nonicteric, EOM-wnl, PERRLA, sl proptosis, EACs-clear, TMs-wnl; NOSE-clear; THROAT-clear & wnl. Neck:  Supple w/ fair ROM; no JVD; normal carotid impulses w/o bruits; no thyromegaly or nodules palpated; no lymphadenopathy. Chest:  Clear to P & A; without wheezes, rales, or rhonchi heard. Heart:  Regular Rhythm; brady ~54, norm S1 & S2, gr2/6 SEM at base, w/o rubs or gallops detected. Abdomen:  Soft & nontender- no guarding or rebound; normal bowel sounds; no organomegaly or masses palpated. Ext:  Normal ROM; without deformities, +arthritic changes; no varicose veins, +venous insuffic, tr edema;  Pulses diminished w/o bruits. Neuro:  No focal neuro deficits; sensory testing normal; gait normal & balance OK. Derm:  No lesions noted; no rash etc. Lymph:  No cervical, supraclavicular, axillary, or inguinal adenopathy palpated.   Assessment:      IMP >>       Hx OSA>  This was mild on Sleep Study in 2012 and he was intol to mask interface & BiPAP machine; he declines retestng...      Ex-smoker w/ very mild obstructive lung dis>  He is relatively asymptomatic, intol to prev inhalers & not inclined to pursue additional therapy...      Abn CT Chest w/ atherosclerotic dis and 2 small  pulm nodules>  He will be due for f/u scan in ~Feb2017...      Mild pulmonary hypertension>  DrMcLean has completed an extensive eval the 2DEcho est of PASP & RHC measurements do not correlate; he is felt to fall into WHO Group 2 (diastolic dysfunction & valv dis).      Decreased DLCO on Full PFTs>  This is the least understood finding on his studies to date- ?why the persistently decr DLCO ~50% predicted?  He has smoked enough to have emphysema but I would expect more airflow obstructive dis, and the CT Chest did not indicate a signif interstitial process;  I would suggest considering a Hi-res CT Chest early in 2017 when his yearly f/u CT is due (to f/u the 26m LLL nodule)...  PLAN >>       He is relatively  asymptomatic from the pulmonary standpoint & he is NOT inclined to use inhalers etc;  Neither is he inclined to pursue repeat OSA testing, CPAP/BiPAP, etc;  He feels that he is improved on the Lasix20; he needs a f/u CT Chest in ~Feb2017 & I would suggest a Hi-Res CT Chest at that time... I told him we'd be happy to see him back at any time, or have him set up w/ [facility] for his input.     Plan:     Patient's Medications  New Prescriptions   No medications on file  Previous Medications   AMLODIPINE-ATORVASTATIN (CADUET) 10-40 MG TABLET    Take 1 tablet by mouth daily.   ASPIRIN 81 MG TABLET    Take 81 mg by mouth daily.   BRINZOLAMIDE-BRIMONIDINE (SIMBRINZA) 1-0.2 % SUSP    Apply 1 drop to eye 2 (two) times daily.   DOXAZOSIN (CARDURA) 2 MG TABLET    Take 3 tablets (6 mg total) by mouth daily.   FUROSEMIDE (LASIX) 20 MG TABLET       HYDRALAZINE (APRESOLINE) 25 MG TABLET    Take 1 tablet (25 mg total) by mouth 3 (three) times daily. Take with 50 mg hydralazine for total of 75 mg three times a day.   HYDRALAZINE (APRESOLINE) 50 MG TABLET    TAKE 1 TABLET (50 MG TOTAL) BY MOUTH 3 (THREE) TIMES DAILY. NEW DOSE   LEVOTHYROXINE (SYNTHROID, LEVOTHROID) 112 MCG TABLET    Take 112 mcg by mouth  daily before breakfast.   MULTIPLE VITAMIN (MULTIVITAMIN) TABLET    Take 1 tablet by mouth daily.     NITROGLYCERIN (NITROSTAT) 0.4 MG SL TABLET    Place 0.4 mg under the tongue every 5 (five) minutes as needed for chest pain.    POTASSIUM CHLORIDE (K-DUR,KLOR-CON) 10 MEQ TABLET    Take 1 tablet (10 mEq total) by mouth once.  Modified Medications   No medications on file  Discontinued Medications   No medications on file

## 2015-02-20 NOTE — Patient Instructions (Signed)
Shamal-- it was nice meeting you today...  We reviewed your extensive old data and rechecked your breathing tests...    There is currently no indication for breathing meds or inhalers...   You should have a follow up CT scan of your chest in [id number] to recheck the tiny nodules seen on your prev scan and to check for any scarring...    This test can be arranged by any of your physicians, please let me know if you would like my assistance in arranging this procedure...  Call for any questions or if I can be of service in any way.Marland KitchenMarland Kitchen

## 2015-02-25 DIAGNOSIS — N183 Chronic kidney disease, stage 3 (moderate): Secondary | ICD-10-CM | POA: Diagnosis not present

## 2015-02-25 DIAGNOSIS — D631 Anemia in chronic kidney disease: Secondary | ICD-10-CM | POA: Diagnosis not present

## 2015-02-25 DIAGNOSIS — N189 Chronic kidney disease, unspecified: Secondary | ICD-10-CM | POA: Diagnosis not present

## 2015-02-25 DIAGNOSIS — N2581 Secondary hyperparathyroidism of renal origin: Secondary | ICD-10-CM | POA: Diagnosis not present

## 2015-03-03 ENCOUNTER — Ambulatory Visit (HOSPITAL_COMMUNITY)
Admission: RE | Admit: 2015-03-03 | Discharge: 2015-03-03 | Disposition: A | Discharge status: Home / Self Care | Payer: Medicare Other | Source: Ambulatory Visit | Attending: Internal Medicine | Admitting: Internal Medicine

## 2015-03-03 ENCOUNTER — Other Ambulatory Visit (HOSPITAL_COMMUNITY): Payer: Self-pay | Admitting: Internal Medicine

## 2015-03-03 ENCOUNTER — Ambulatory Visit (HOSPITAL_COMMUNITY): Payer: Medicare Other

## 2015-03-03 ENCOUNTER — Encounter (HOSPITAL_COMMUNITY): Payer: Self-pay

## 2015-03-03 VITALS — BP 154/62 | HR 48 | Wt 147.5 lb

## 2015-03-03 DIAGNOSIS — N183 Chronic kidney disease, stage 3 unspecified: Secondary | ICD-10-CM

## 2015-03-03 DIAGNOSIS — I350 Nonrheumatic aortic (valve) stenosis: Secondary | ICD-10-CM | POA: Diagnosis not present

## 2015-03-03 DIAGNOSIS — N189 Chronic kidney disease, unspecified: Secondary | ICD-10-CM | POA: Diagnosis not present

## 2015-03-03 DIAGNOSIS — I272 Other secondary pulmonary hypertension: Secondary | ICD-10-CM | POA: Diagnosis not present

## 2015-03-03 DIAGNOSIS — I341 Nonrheumatic mitral (valve) prolapse: Secondary | ICD-10-CM | POA: Diagnosis not present

## 2015-03-03 DIAGNOSIS — Z87891 Personal history of nicotine dependence: Secondary | ICD-10-CM | POA: Diagnosis not present

## 2015-03-03 DIAGNOSIS — Z79899 Other long term (current) drug therapy: Secondary | ICD-10-CM | POA: Diagnosis not present

## 2015-03-03 DIAGNOSIS — Z7982 Long term (current) use of aspirin: Secondary | ICD-10-CM | POA: Diagnosis not present

## 2015-03-03 DIAGNOSIS — E1122 Type 2 diabetes mellitus with diabetic chronic kidney disease: Secondary | ICD-10-CM | POA: Diagnosis not present

## 2015-03-03 DIAGNOSIS — E039 Hypothyroidism, unspecified: Secondary | ICD-10-CM | POA: Diagnosis not present

## 2015-03-03 DIAGNOSIS — I5022 Chronic systolic (congestive) heart failure: Secondary | ICD-10-CM

## 2015-03-03 DIAGNOSIS — I739 Peripheral vascular disease, unspecified: Secondary | ICD-10-CM | POA: Insufficient documentation

## 2015-03-03 DIAGNOSIS — Z8546 Personal history of malignant neoplasm of prostate: Secondary | ICD-10-CM | POA: Insufficient documentation

## 2015-03-03 DIAGNOSIS — E785 Hyperlipidemia, unspecified: Secondary | ICD-10-CM | POA: Diagnosis not present

## 2015-03-03 DIAGNOSIS — I129 Hypertensive chronic kidney disease with stage 1 through stage 4 chronic kidney disease, or unspecified chronic kidney disease: Secondary | ICD-10-CM | POA: Insufficient documentation

## 2015-03-03 DIAGNOSIS — I5032 Chronic diastolic (congestive) heart failure: Secondary | ICD-10-CM | POA: Insufficient documentation

## 2015-03-03 DIAGNOSIS — I2510 Atherosclerotic heart disease of native coronary artery without angina pectoris: Secondary | ICD-10-CM | POA: Diagnosis not present

## 2015-03-03 DIAGNOSIS — G4733 Obstructive sleep apnea (adult) (pediatric): Secondary | ICD-10-CM | POA: Diagnosis not present

## 2015-03-03 DIAGNOSIS — R001 Bradycardia, unspecified: Secondary | ICD-10-CM | POA: Diagnosis not present

## 2015-03-03 DIAGNOSIS — E0500 Thyrotoxicosis with diffuse goiter without thyrotoxic crisis or storm: Secondary | ICD-10-CM | POA: Diagnosis not present

## 2015-03-03 LAB — BASIC METABOLIC PANEL
ANION GAP: 9 (ref 5–15)
BUN: 44 mg/dL — AB (ref 6–20)
CALCIUM: 9.9 mg/dL (ref 8.9–10.3)
CO2: 23 mmol/L (ref 22–32)
CREATININE: 2.63 mg/dL — AB (ref 0.61–1.24)
Chloride: 108 mmol/L (ref 101–111)
GFR calc Af Amer: 25 mL/min — ABNORMAL LOW (ref >60)
GFR, EST NON AFRICAN AMERICAN: 21 mL/min — AB (ref >60)
GLUCOSE: 147 mg/dL — AB (ref 65–99)
Potassium: 4.2 mmol/L (ref 3.5–5.1)
Sodium: 140 mmol/L (ref 135–145)

## 2015-03-03 LAB — BRAIN NATRIURETIC PEPTIDE: B Natriuretic Peptide: 801.8 pg/mL — ABNORMAL HIGH (ref 0.0–100.0)

## 2015-03-03 MED ORDER — HYDRALAZINE HCL 100 MG PO TABS: 100.0000 mg | ORAL_TABLET | Freq: Three times a day (TID) | ORAL | Status: DC

## 2015-03-03 NOTE — Patient Instructions (Signed)
Increase Hydralazine to 100 mg Three times a day, we have sent you in a prescription for 100 mg tablets  Labs today  We will contact you in 3 months to schedule your next appointment.

## 2015-03-03 NOTE — Progress Notes (Signed)
Advanced Heart Failure Medication Review by a Pharmacist  Does the patient  feel that his/her medications are working for him/her?  yes  Has the patient been experiencing any side effects to the medications prescribed?  no  Does the patient measure his/her own blood pressure or blood glucose at home?  no   Does the patient have any problems obtaining medications due to transportation or finances?   no  Understanding of regimen: good Understanding of indications: good Potential of compliance: good Patient understands to avoid NSAIDs. Patient understands to avoid decongestants.  Issues to address at subsequent visits: None   Pharmacist comments:  Jeffrey Campbell is an 81 yo M presenting with his current medication list. He reports excellent compliance with his medications and did not have any specific medication-related questions or concerns at this time.   Ruta Hinds. Velva Harman, PharmD, BCPS, CPP Clinical Pharmacist Pager: [phone] Phone: [phone] 03/03/2015 11:41 AM      Time with patient: 6 minutes Preparation and documentation time: 2 minutes Total time: 8 minutes

## 2015-03-03 NOTE — Progress Notes (Signed)
Patient ID: Jeffrey Campbell, male   DOB: 1932-08-23, 81 y.o.   MRN: [id number] PCP: Dr. Drema [place] Cardiology: Dr Radford Pax HF Cardiology: Dr. Aundra Dubin  81 yo with history of CAD, PAD, CKD, sinus bradycardia, and pulmonary HTN by echo presents for CHF clinic evaluation.  Patient had an echo in 10/16 showing normal LV EF, but moderately dilated RV with PASP 68 mmHg.  He reports no chest pain.  He is able to do most of what he wants without dyspnea.  He notes some shortness of breath walking up a hill.  He is able to push mow his yard and rake leaves without noticeable dyspnea.  No lightheadedness or syncope.  No orthopnea or PND.  No tachypalpitations.    He has had an extensive workup. PFTs showed mild obstruction but moderate to severely decreased DLCO.  V/Q scan was negative for evidence of acute or chronic PE.  He had had a CT chest back in 2/16 that showed no definitive evidence for interstitial lung disease.    Patient is bradycardic with HR in 40s.  This seems to be asymptomatic.  Recent holter showed average HR 44 bpm.   Rather diffuse moderate CAD on LHC in 2012 but no intervention.  Cardiolite in 4/16 showed no ischemia or infarction. I had him do a right heart catheterization.  This showed normal mean RA pressure and mean PCWP but both pressure tracings had large V-waves.  BP high today.  He has bilateral renal artery stenosis by renal artery dopplers in 10/16.  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  No claudication or intestinal angina.  He has been seen by Dr Fletcher Anon and offered renal artery intervention.   I have started him on Lasix.  He feels like this has helped his breathing.  Weight is down 7 lbs.   Labs (4/16): LDL 70 Labs (10/16): BNP 621, K 3.9, creatinine 2.51 Labs (11/16): K 4.2, creatinine 2.49  PMH: 1. CAD: LHC (2012) with 50-60% ostial LAD, 50-60% mLAD, 60% pRCA.  Cardiolite (4/16) with EF 58%, no ischemia or infarction.  2. [place] 3. HTN 4.  Hyperlipidemia 5. Graves disease treated with RAI, now hypothyroid. 6. PUD 7. CKD III-IV 8. Type II diabetes 9. OSA: Not using CPAP 10. Sinus bradycardia: Holter (10/16) with average HR 44, occasional junctional rhythm.  11. Mitral valve prolapse.  12. Chronic diastolic CHF: Echo (123XX123) with EF 60-65%, grade II diastolic dysfunction, mild AI, MVP with mild MR, moderate RV dilation with normal systolic function, PASP 68 mmHg.   13. 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  14. Pulmonary hypertension: PASP 68 mmHg on 10/16 echo.  PFTs (11/16) with FEV1 76%, FVC 78%, ratio 95%, TLC 73%, DLCO 48% => mild obstruction with moderate to severely decreased DLCO.  CT chest (2/16) without definitive interstitial lung disease.  V/Q scan 11/16 with no perfusion defects.  RHC with mean RA 5 (v-waves to 18), PA 45/9 mean 24, PCWP mean 13 (v-waves to 33), CI 3.42, PVR 1.8 WU.  15. Carotid dopplers (10/16) with mild disease.   SH: Married, lives in [place], retired Scientist, research (medical), heavy smoker previously but quit in 1980s.    FH: HTN, CKD.  ROS: All systems reviewed and negative except as per HPI.   Current Outpatient Prescriptions  Medication Sig Dispense Refill  . amLODipine-atorvastatin (CADUET) 10-40 MG tablet Take 1 tablet by mouth  daily. 90 tablet 3  . aspirin 81 MG tablet Take 81 mg by mouth daily.    . Brinzolamide-Brimonidine (SIMBRINZA) 1-0.2 % SUSP Apply 1 drop to eye 2 (two) times daily.    Marland Kitchen doxazosin (CARDURA) 2 MG tablet Take 3 tablets (6 mg total) by mouth daily. 90 tablet 6  . furosemide (LASIX) 20 MG tablet Take 20 mg by mouth daily.     Marland Kitchen levothyroxine (SYNTHROID, LEVOTHROID) 112 MCG tablet Take 112 mcg by mouth daily before breakfast.  5  . Multiple Vitamin (MULTIVITAMIN) tablet Take 1 tablet by mouth daily.      . potassium chloride (K-DUR,KLOR-CON) 10 MEQ tablet Take 10 mEq by mouth  daily.    . hydrALAZINE (APRESOLINE) 100 MG tablet Take 1 tablet (100 mg total) by mouth 3 (three) times daily. 90 tablet 3  . nitroGLYCERIN (NITROSTAT) 0.4 MG SL tablet Place 0.4 mg under the tongue every 5 (five) minutes as needed for chest pain.      No current facility-administered medications for this encounter.   BP 154/62 mmHg  Pulse 48  Wt 147 lb 8 oz (66.906 kg)  SpO2 97% General: NAD Neck: JVP 7 cm, no thyromegaly or thyroid nodule.  Lungs: Clear to auscultation bilaterally with normal respiratory effort. CV: Nondisplaced PMI.  Heart regular S1/S2, no S3/S4, 2/6 SEM RUSB.  No edema.  Left carotid bruit.  Normal pedal pulses.  Abdomen: Soft, nontender, no hepatosplenomegaly, no distention.  Skin: Intact without lesions or rashes.  Neurologic: Alert and oriented x 3.  Psych: Normal affect. Extremities: No clubbing or cyanosis.  HEENT: Normal.   Assessment/Plan: 1. Pulmonary hypertension: Noted by echo with PASP 68 mmHg and moderately dilated RV.  PFTs showed mild obstruction with moderate to severely decreased DLCO, suggesting a pulmonary vascular problem.  CT chest in 2/16 did not show definitive ILD.  V/Q scan did not show evidence for acute or chronic PE.  He carries a history of OSA but is not using CPAP.  RHC showed mild pulmonary venous hypertension, PVR was only 1.8 WU.   There is no specific treatment beyond diuresis.  2. Chronic diastolic CHF: Patient has diastolic LV failure with mild pulmonary venous HTN.  CKD stage III-IV increases tendency to retain fluid.  - Continue current Lasix. BMET/BNP today.  3. OSA: He does not want a sleep study and is not interested in CPAP.  4. CKD: Stage III-IV.  Stable recently.   5. HTN: Suspect bilateral renal artery stenosis plays a role. - Increase hydralazine to 100 mg tid.  - He has been seeing Dr. Fletcher Anon for evaluation of renal artery stenosis.  Now trying to decide whether to intervene.    6. PAD: Renal artery stenosis,  mesenteric artery stenosis, distal aortic stenosis.  Followed by Dr Fletcher Anon. 7. CAD: Stable with no chest pain.  Continue ASA 81 and statin. Goal LDL < 70.  4/16 Cardiolite with no ischemia.  8. Sinus bradycardia:  Asympatomatic.  Follow for now.   9. Prominent v-waves in RA and PCWP pressure tracing:  Probably due to stiff LV/prominent diastolic CHF.  However, I am going to arrange for a limited echo to make sure that we are not missing significant mitral regurgitation.  Followup in 3 months.   Loralie Champagne 03/03/2015

## 2015-03-07 ENCOUNTER — Ambulatory Visit (HOSPITAL_COMMUNITY)
Admission: RE | Admit: 2015-03-07 | Discharge: 2015-03-07 | Disposition: A | Discharge status: Home / Self Care | Payer: Medicare Other | Source: Ambulatory Visit | Attending: Internal Medicine | Admitting: Internal Medicine

## 2015-03-07 DIAGNOSIS — E785 Hyperlipidemia, unspecified: Secondary | ICD-10-CM | POA: Diagnosis not present

## 2015-03-07 DIAGNOSIS — I5022 Chronic systolic (congestive) heart failure: Secondary | ICD-10-CM | POA: Diagnosis not present

## 2015-03-07 DIAGNOSIS — I509 Heart failure, unspecified: Secondary | ICD-10-CM | POA: Diagnosis present

## 2015-03-07 DIAGNOSIS — I071 Rheumatic tricuspid insufficiency: Secondary | ICD-10-CM | POA: Insufficient documentation

## 2015-03-07 DIAGNOSIS — I77810 Thoracic aortic ectasia: Secondary | ICD-10-CM | POA: Insufficient documentation

## 2015-03-07 DIAGNOSIS — I340 Nonrheumatic mitral (valve) insufficiency: Secondary | ICD-10-CM | POA: Insufficient documentation

## 2015-03-07 DIAGNOSIS — I341 Nonrheumatic mitral (valve) prolapse: Secondary | ICD-10-CM | POA: Insufficient documentation

## 2015-03-07 DIAGNOSIS — E119 Type 2 diabetes mellitus without complications: Secondary | ICD-10-CM | POA: Diagnosis not present

## 2015-03-07 DIAGNOSIS — I517 Cardiomegaly: Secondary | ICD-10-CM | POA: Insufficient documentation

## 2015-03-07 DIAGNOSIS — I10 Essential (primary) hypertension: Secondary | ICD-10-CM | POA: Diagnosis not present

## 2015-03-07 NOTE — Progress Notes (Signed)
  Echocardiogram 2D Echocardiogram has been performed.  Jeffrey Campbell 03/07/2015, 10:50 AM

## 2015-03-18 ENCOUNTER — Ambulatory Visit (INDEPENDENT_AMBULATORY_CARE_PROVIDER_SITE_OTHER): Payer: Medicare Other | Admitting: Cardiovascular Disease

## 2015-03-18 ENCOUNTER — Encounter: Payer: Self-pay | Admitting: Cardiovascular Disease

## 2015-03-18 VITALS — BP 160/50 | HR 42 | Ht 69.0 in | Wt 148.1 lb

## 2015-03-18 DIAGNOSIS — Z23 Encounter for immunization: Secondary | ICD-10-CM

## 2015-03-18 DIAGNOSIS — I701 Atherosclerosis of renal artery: Secondary | ICD-10-CM

## 2015-03-18 MED ORDER — DOXAZOSIN MESYLATE 2 MG PO TABS: 6.0000 mg | ORAL_TABLET | Freq: Every day | ORAL | Status: DC

## 2015-03-18 NOTE — Assessment & Plan Note (Signed)
The patient has refractory hypertension as well as chronic kidney disease. However, it appears that both of them have been relatively stable over the last few years. Renal artery stenosis was noted on prior catheterization in 2002. Thus, I think it is difficult to predict improvement in renal function and/or hypertension with renal artery revascularization. At the same time, there will be a considerable risk associated with the procedure including worsening kidney function due to contrast-induced nephropathy or embolization. The patient seems to be satisfied overall with his clinical condition and he feels stable. Thus, I'm going to continue to monitor him clinically for signs of worsening. Follow-up with me in 6 months.

## 2015-03-18 NOTE — Patient Instructions (Signed)

## 2015-03-18 NOTE — Progress Notes (Signed)
Primary cardiologist: Dr. Radford Pax Nephrologist: Dr. Justin Mend  HPI  This is a pleasant 82 year old male who is here today for a follow-up visit regarding  bilateral renal artery stenosis. The patient has known history of moderate nonobstructive coronary artery disease on previous cardiac catheterization in 2002. He had renal angiography done at the same time which showed 70% bilateral stenosis of his renal arteries. He has known history of refractory hypertension, hyperlipidemia, pulmonary hypertension and chronic kidney disease with a most recent creatinine around 2.5. Creatinine 5 years ago was 1.6 and 2 years ago was 1.8. He was noted recently to have abdominal bruits. He underwent renal artery duplex which showed severe bilateral renal artery stenosis. There was also greater than 50% stenosis in the distal aorta.  His blood pressure remains the same. He has chronic bradycardia but overall is asymptomatic. He denies any chest pain, shortness of breath or dizziness. He has no postprandial pain.   Allergies  Allergen Reactions  . Tiotropium Bromide Monohydrate Other (See Comments)    Dry mouth  . Budesonide-Formoterol Fumarate     Pain in muscles, cramps in hands and legs  . Other     DYE- Causes kidney to shut down  . Tricor [Fenofibrate]     Unknown   . Zocor [Simvastatin]     Liver problems      Current Outpatient Prescriptions on File Prior to Visit  Medication Sig Dispense Refill  . amLODipine-atorvastatin (CADUET) 10-40 MG tablet Take 1 tablet by mouth daily. 90 tablet 3  . aspirin 81 MG tablet Take 81 mg by mouth daily.    . Brinzolamide-Brimonidine (SIMBRINZA) 1-0.2 % SUSP Apply 1 drop to eye 2 (two) times daily.    Marland Kitchen doxazosin (CARDURA) 2 MG tablet Take 3 tablets (6 mg total) by mouth daily. 90 tablet 6  . furosemide (LASIX) 20 MG tablet Take 20 mg by mouth daily.     . hydrALAZINE (APRESOLINE) 100 MG tablet Take 1 tablet (100 mg total) by mouth 3 (three) times daily. 90 tablet 3   . levothyroxine (SYNTHROID, LEVOTHROID) 112 MCG tablet Take 112 mcg by mouth daily before breakfast.  5  . Multiple Vitamin (MULTIVITAMIN) tablet Take 1 tablet by mouth daily.      . nitroGLYCERIN (NITROSTAT) 0.4 MG SL tablet Place 0.4 mg under the tongue every 5 (five) minutes as needed for chest pain.     . potassium chloride (K-DUR,KLOR-CON) 10 MEQ tablet Take 10 mEq by mouth daily.     No current facility-administered medications on file prior to visit.     Past Medical History  Diagnosis Date  . PUD (peptic ulcer disease)   . Hypertension   . Hyperlipemia   . CAD (coronary artery disease)   . Shutdown renal     after cardiac cath  . Organic impotence   . CKD (chronic kidney disease), stage III   . History of ETOH abuse   . Type 2 diabetes, diet controlled ([place])   . Pulmonary hypertension ([place]) echo 2015    moderate PASP 54mmHg  . Upper airway resistance syndrome   . OSA (obstructive sleep apnea)     upper airway resistance syndrome with RDI 18/hr - not on CPAP due to insurance not covering  . Glaucoma   . BPH (benign prostatic hyperplasia)   . Hyperthyroidism 08/26/10    radioactive iodine therapy   . Multiple thyroid nodules   . Graves disease   . History of PFTs 05/2010    moderate  airflow obstruction w reduced DLCO by PFT   . Mitral regurgitation echo 2015    mild  . MVP (mitral valve prolapse) 11/2012    posterior MVP  . Diastolic dysfunction   . Heart murmur, systolic   . Carotid artery stenosis     0-39% by dopplers 2015     Past Surgical History  Procedure Laterality Date  . Appendectomy    . Heart catherization    . Hernia repair  10/2009  . Cardiac catheterization    . Cardiac catheterization N/A 02/17/2015    Procedure: Right Heart Cath;  Surgeon: Larey Dresser, MD;  Location: [facility];  Service: Cardiovascular;  Laterality: N/A;     Family History  Problem Relation Age of Onset  . Kidney failure Father   . Hypertension Father   .  Colon cancer Mother   . Colon cancer Brother   . Lung disease Brother   . Colon cancer Maternal Uncle      Social History   Social History  . Marital Status: Married    Spouse Name: N/A  . Number of Children: 4  . Years of Education: N/A   Occupational History  . Retired     [employer]   Social History Main Topics  . Smoking status: Former Smoker -- 2.00 packs/day for 40 years    Types: Cigarettes    Quit date: 03/29/1984  . Smokeless tobacco: Never Used  . Alcohol Use: No     Comment: quit in 1981  . Drug Use: No  . Sexual Activity: Not on file   Other Topics Concern  . Not on file   Social History Narrative     ROS A 10 point review of system was performed. It is negative other than that mentioned in the history of present illness.   PHYSICAL EXAM   BP 160/50 mmHg  Pulse 42  Ht 5\' 9"  (1.753 m)  Wt 148 lb 1.9 oz (67.187 kg)  BMI 21.86 kg/m2 Constitutional: He is oriented to person, place, and time. He appears well-developed and well-nourished. No distress.  HENT: No nasal discharge.  Head: Normocephalic and atraumatic.  Eyes: Pupils are equal and round.  No discharge. Neck: Normal range of motion. Neck supple. No JVD present. No thyromegaly present.  Cardiovascular: Normal rate, regular rhythm, normal heart sounds. Exam reveals no gallop and no friction rub. No murmur heard.  Pulmonary/Chest: Effort normal and breath sounds normal. No stridor. No respiratory distress. He has no wheezes. He has no rales. He exhibits no tenderness.  Abdominal: Soft. Bowel sounds are normal. He exhibits no distension. There is no tenderness. There is no rebound and no guarding.  Musculoskeletal: Normal range of motion. He exhibits no edema and no tenderness.  Neurological: He is alert and oriented to person, place, and time. Coordination normal.  Skin: Skin is warm and dry. No rash noted. He is not diaphoretic. No erythema. No pallor.  Psychiatric: He has a normal mood  and affect. His behavior is normal. Judgment and thought content normal.  Vascular: Femoral pulses are normal. Distal pulses are normal. An abdominal bruits noted bilaterally.     ASSESSMENT AND PLAN

## 2015-04-09 DIAGNOSIS — H4010X2 Unspecified open-angle glaucoma, moderate stage: Secondary | ICD-10-CM | POA: Diagnosis not present

## 2015-04-09 DIAGNOSIS — H34831 Tributary (branch) retinal vein occlusion, right eye, with macular edema: Secondary | ICD-10-CM | POA: Diagnosis not present

## 2015-04-09 DIAGNOSIS — H43812 Vitreous degeneration, left eye: Secondary | ICD-10-CM | POA: Diagnosis not present

## 2015-04-17 DIAGNOSIS — H401132 Primary open-angle glaucoma, bilateral, moderate stage: Secondary | ICD-10-CM | POA: Diagnosis not present

## 2015-04-17 DIAGNOSIS — Z961 Presence of intraocular lens: Secondary | ICD-10-CM | POA: Diagnosis not present

## 2015-04-17 DIAGNOSIS — H34831 Tributary (branch) retinal vein occlusion, right eye, with macular edema: Secondary | ICD-10-CM | POA: Diagnosis not present

## 2015-05-05 ENCOUNTER — Other Ambulatory Visit: Payer: Self-pay | Admitting: Family Medicine

## 2015-05-05 DIAGNOSIS — R911 Solitary pulmonary nodule: Secondary | ICD-10-CM | POA: Diagnosis not present

## 2015-05-05 DIAGNOSIS — N183 Chronic kidney disease, stage 3 (moderate): Secondary | ICD-10-CM | POA: Diagnosis not present

## 2015-05-05 DIAGNOSIS — E785 Hyperlipidemia, unspecified: Secondary | ICD-10-CM | POA: Diagnosis not present

## 2015-05-05 DIAGNOSIS — I10 Essential (primary) hypertension: Secondary | ICD-10-CM | POA: Diagnosis not present

## 2015-05-05 DIAGNOSIS — Z0000 Encounter for general adult medical examination without abnormal findings: Secondary | ICD-10-CM | POA: Diagnosis not present

## 2015-05-05 DIAGNOSIS — Z1389 Encounter for screening for other disorder: Secondary | ICD-10-CM | POA: Diagnosis not present

## 2015-05-05 DIAGNOSIS — E119 Type 2 diabetes mellitus without complications: Secondary | ICD-10-CM | POA: Diagnosis not present

## 2015-05-05 DIAGNOSIS — I2510 Atherosclerotic heart disease of native coronary artery without angina pectoris: Secondary | ICD-10-CM | POA: Diagnosis not present

## 2015-05-05 DIAGNOSIS — J449 Chronic obstructive pulmonary disease, unspecified: Secondary | ICD-10-CM | POA: Diagnosis not present

## 2015-05-09 ENCOUNTER — Ambulatory Visit
Admission: RE | Admit: 2015-05-09 | Discharge: 2015-05-09 | Disposition: A | Discharge status: Home / Self Care | Payer: Medicare Other | Source: Ambulatory Visit | Attending: Family Medicine | Admitting: Family Medicine

## 2015-05-09 DIAGNOSIS — R911 Solitary pulmonary nodule: Secondary | ICD-10-CM | POA: Diagnosis not present

## 2015-05-28 DIAGNOSIS — D649 Anemia, unspecified: Secondary | ICD-10-CM | POA: Diagnosis not present

## 2015-06-24 ENCOUNTER — Other Ambulatory Visit (HOSPITAL_COMMUNITY): Payer: Self-pay | Admitting: Cardiology

## 2015-06-25 ENCOUNTER — Other Ambulatory Visit: Payer: Self-pay | Admitting: *Deleted

## 2015-06-25 NOTE — Telephone Encounter (Signed)
PT REQUESTING LASIX REFILLED BY DR [facility] AT CHF.

## 2015-06-27 ENCOUNTER — Other Ambulatory Visit: Payer: Self-pay | Admitting: *Deleted

## 2015-06-27 MED ORDER — FUROSEMIDE 20 MG PO TABS: 20.0000 mg | ORAL_TABLET | Freq: Every day | ORAL | Status: DC

## 2015-07-14 ENCOUNTER — Encounter: Payer: Self-pay | Admitting: Cardiology

## 2015-07-14 ENCOUNTER — Ambulatory Visit (INDEPENDENT_AMBULATORY_CARE_PROVIDER_SITE_OTHER): Payer: Medicare Other | Admitting: Cardiology

## 2015-07-14 VITALS — BP 162/60 | HR 40 | Ht 69.0 in | Wt 147.8 lb

## 2015-07-14 DIAGNOSIS — I272 Other secondary pulmonary hypertension: Secondary | ICD-10-CM

## 2015-07-14 DIAGNOSIS — I2510 Atherosclerotic heart disease of native coronary artery without angina pectoris: Secondary | ICD-10-CM | POA: Diagnosis not present

## 2015-07-14 DIAGNOSIS — I6529 Occlusion and stenosis of unspecified carotid artery: Secondary | ICD-10-CM

## 2015-07-14 DIAGNOSIS — I10 Essential (primary) hypertension: Secondary | ICD-10-CM

## 2015-07-14 DIAGNOSIS — I341 Nonrheumatic mitral (valve) prolapse: Secondary | ICD-10-CM | POA: Diagnosis not present

## 2015-07-14 DIAGNOSIS — I5032 Chronic diastolic (congestive) heart failure: Secondary | ICD-10-CM

## 2015-07-14 DIAGNOSIS — I2583 Coronary atherosclerosis due to lipid rich plaque: Principal | ICD-10-CM

## 2015-07-14 DIAGNOSIS — IMO0002 Reserved for concepts with insufficient information to code with codable children: Secondary | ICD-10-CM

## 2015-07-14 DIAGNOSIS — E785 Hyperlipidemia, unspecified: Secondary | ICD-10-CM

## 2015-07-14 NOTE — Progress Notes (Signed)
Cardiology Office Note    Date:  07/14/2015   ID:  Jeffrey Campbell, DOB 1932/09/22, MRN [id number]  PCP:  Jeffrey Heck, MD  Cardiologist:  Sueanne Margarita, MD   Chief Complaint  Patient presents with  . Coronary Artery Disease  . Hypertension  . Mitral Valve Prolapse    History of Present Illness:  Jeffrey Campbell is a 82 y.o. male with a history of ASCAD ([place] 2012 with 50-60% ostial LAD, 50-60%mLAD, 60% pRCA), HTN, dyslipidemia, mild MR with posterior MVP, bradycardia and moderate pulmonary HTN presents today for followup.He is followed by Dr. Aundra Dubin for pulmonary HTN.  He has had an extensive w/u for pulmonary HTN (Group I from pulmonary venous HTN in setting of elevated left heart pressure) with PFTs showing mild COPD but moderate to severely reduced DLCO, neg VQ scan for PE and chest CT with no interstitial lung disease.  He does have OSA but is intolerant to CPAP.  He also has bilateral renal artery stenosis, mesenteric artery stenosis and distal aortic stenosis followed by Dr. Fletcher Anon. [place] 01/2015 showed mild pulmonary venous HTN with prominent V waves in the PCWP and RA.  2D echo showed mild MR with MVP so prominent V waves felt to be due to stiff ventricles/diastolic dysfunction in setting of poorly controlled HTN.   He has chronic diastolic CHF. He presents back today doing well.  He denies anychest pain, SOB, DOE, LE edema, palpitations, dizziness Campbell syncope.Marland Kitchen His fatigue has improved with treatment of hypothyroidism.       Past Medical History  Diagnosis Date  . PUD (peptic ulcer disease)   . Hypertension   . Hyperlipemia   . CAD (coronary artery disease)   . Shutdown renal     after cardiac cath  . Organic impotence   . CKD (chronic kidney disease), stage III   . History of ETOH abuse   . Type 2 diabetes, diet controlled ([place])   . Pulmonary hypertension ([place]) echo 2015    moderate PASP 4mmHg  . Upper airway resistance syndrome   . OSA (obstructive sleep  apnea)     upper airway resistance syndrome with RDI 18/hr - not on CPAP due to insurance not covering  . Glaucoma   . BPH (benign prostatic hyperplasia)   . Hyperthyroidism 08/26/10    radioactive iodine therapy   . Multiple thyroid nodules   . Graves disease   . History of PFTs 05/2010    moderate airflow obstruction w reduced DLCO by PFT   . Mitral regurgitation echo 2015    mild  . MVP (mitral valve prolapse) 11/2012    posterior MVP  . Diastolic dysfunction   . Heart murmur, systolic   . Carotid artery stenosis     0-39% by dopplers 2015    Past Surgical History  Procedure Laterality Date  . Appendectomy    . Heart catherization    . Hernia repair  10/2009  . Cardiac catheterization    . Cardiac catheterization N/A 02/17/2015    Procedure: Right Heart Cath;  Surgeon: Larey Dresser, MD;  Location: [place] [facility];  Service: Cardiovascular;  Laterality: N/A;    Current Medications: Outpatient Prescriptions Prior to Visit  Medication Sig Dispense Refill  . amLODipine-atorvastatin (CADUET) 10-40 MG tablet Take 1 tablet by mouth daily. 90 tablet 3  . aspirin 81 MG tablet Take 81 mg by mouth daily.    . Brinzolamide-Brimonidine (SIMBRINZA) 1-0.2 % SUSP Apply 1 drop to eye  2 (two) times daily.    Marland Kitchen doxazosin (CARDURA) 2 MG tablet Take 3 tablets (6 mg total) by mouth daily. 270 tablet 3  . furosemide (LASIX) 20 MG tablet Take 1 tablet (20 mg total) by mouth daily. KEEP OV. 30 tablet 0  . hydrALAZINE (APRESOLINE) 100 MG tablet TAKE 1 TABLET (100 MG TOTAL) BY MOUTH 3 (THREE) TIMES DAILY. 90 tablet 3  . KLOR-CON M10 10 MEQ tablet TAKE 1 TABLET (10 MEQ TOTAL) BY MOUTH ONCE. 30 tablet 3  . levothyroxine (SYNTHROID, LEVOTHROID) 112 MCG tablet Take 112 mcg by mouth daily before breakfast.  5  . Multiple Vitamin (MULTIVITAMIN) tablet Take 1 tablet by mouth daily.      . nitroGLYCERIN (NITROSTAT) 0.4 MG SL tablet Place 0.4 mg under the tongue every 5 (five) minutes as needed for chest  pain.     . potassium chloride (K-DUR,KLOR-CON) 10 MEQ tablet Take 10 mEq by mouth daily.     No facility-administered medications prior to visit.     Allergies:   Tiotropium bromide monohydrate; Budesonide-formoterol fumarate; Other; Tricor; and Zocor   Social History   Social History  . Marital Status: Married    Spouse Name: N/A  . Number of Children: 4  . Years of Education: N/A   Occupational History  . Retired     [employer]   Social History Main Topics  . Smoking status: Former Smoker -- 2.00 packs/day for 40 years    Types: Cigarettes    Quit date: 03/29/1984  . Smokeless tobacco: Never Used  . Alcohol Use: No     Comment: quit in 1981  . Drug Use: No  . Sexual Activity: Not Asked   Other Topics Concern  . None   Social History Narrative     Family History:  The patient's family history includes Colon cancer in his brother, maternal uncle, and mother; Hypertension in his father; Kidney failure in his father; Lung disease in his brother.   ROS:   Please see the history of present illness.    ROS All other systems reviewed and are negative.   PHYSICAL EXAM:   VS:  BP 162/60 mmHg  Pulse 40  Ht 5\' 9"  (1.753 m)  Wt 147 lb 12.8 oz (67.042 kg)  BMI 21.82 kg/m2   GEN: Well nourished, well developed, in no acute distress HEENT: normal Neck: no JVD, carotid bruits, Campbell masses Cardiac: RRR; no rubs, Campbell gallops,no edema.  Intact distal pulses bilaterally. 2/6 late systolic murmur at LLSB to apex and radiating to the left carotid.   Respiratory:  clear to auscultation bilaterally, normal work of breathing GI: soft, nontender, nondistended, + BS MS: no deformity Campbell atrophy Skin: warm and dry, no rash Neuro:  Alert and Oriented x 3, Strength and sensation are intact Psych: euthymic mood, full affect  Wt Readings from Last 3 Encounters:  07/14/15 147 lb 12.8 oz (67.042 kg)  03/18/15 148 lb 1.9 oz (67.187 kg)  03/03/15 147 lb 8 oz (66.906 kg)       Studies/Labs Reviewed:   EKG:  EKG is not ordered today.    Recent Labs: 07/15/2014: ALT 17 02/10/2015: Hemoglobin 11.0*; Platelets 167 03/03/2015: B Natriuretic Peptide 801.8*; BUN 44*; Creatinine, Ser 2.63*; Potassium 4.2; Sodium 140   Lipid Panel    Component Value Date/Time   CHOL 145 07/15/2014 1338   TRIG 62.0 07/15/2014 1338   HDL 63.10 07/15/2014 1338   CHOLHDL 2 07/15/2014 1338   VLDL 12.4  07/15/2014 1338   [place] 70 07/15/2014 1338    Additional studies/ records that were reviewed today include:  None    ASSESSMENT:    1. Coronary artery disease due to lipid rich plaque   2. Essential hypertension   3. Secondary pulmonary hypertension ([place])   4. MVP (mitral valve prolapse)   5. Carotid artery stenosis, unspecified laterality   6. Chronic diastolic CHF (congestive heart failure) ([place])   7. Pulmonary hypertension ([place])   8. Hyperlipemia      PLAN:  In order of problems listed above:  1. ASCAD - nonobstructive with no angina.  Continue ASA/statin. 2. HTN - BP is well controlled. He has renal artery stenosis followed by Dr. Fletcher Anon. Continue Caduet/Cardura/Hydralazine 3. Grade 2 Pulmonary HTN secondary to pulmonary venous HTN from diastolic dysfunction and HTN.  His last echo 12/16 showed decreased PASP from 66 to 23mmHg.  He is followed by Dr. Aundra Dubin.   4. MVP with mild MR on last echo 5. Carotid artery stenosis - it has been 2 years since last assessment so I will repeat carotid dopplers.  Continue ASA/statin 6. Chronic diastolic CHF - he appears euvolemic on exam today.  Continue Lasix 7. Hyperlipidemia with LDL goal < 70 - continue statin. I will get a copy of his last lipids from PCP.  Continue Caduet    Medication Adjustments/Labs and Tests Ordered: Current medicines are reviewed at length with the patient today.  Concerns regarding medicines are outlined above.  Medication changes, Labs and Tests ordered today are listed in the Patient Instructions  below. Patient Instructions  Medication Instructions:  Your physician recommends that you continue on your current medications as directed. Please refer to the Current Medication list given to you today.   Labwork: None  Testing/Procedures: Your physician has requested that you have a carotid duplex. This test is an ultrasound of the carotid arteries in your neck. It looks at blood flow through these arteries that supply the brain with blood. Allow one hour for this exam. There are no restrictions Campbell special instructions.  Follow-Up: Your physician wants you to follow-up in: 6 months with Dr. Radford Pax. You will receive a reminder letter in the mail two months in advance. If you don't receive a letter, please call our office to schedule the follow-up appointment.   Any Other Special Instructions Will Be Listed Below (If Applicable).     If you need a refill on your cardiac medications before your next appointment, please call your pharmacy.       Lurena Nida, MD  07/14/2015 9:18 AM    [facility] [address], [place], [place]  [address] Phone: [phone]; Fax: [phone]

## 2015-07-14 NOTE — Patient Instructions (Signed)
Medication Instructions:  Your physician recommends that you continue on your current medications as directed. Please refer to the Current Medication list given to you today.   Labwork: None  Testing/Procedures: Your physician has requested that you have a carotid duplex. This test is an ultrasound of the carotid arteries in your neck. It looks at blood flow through these arteries that supply the brain with blood. Allow one hour for this exam. There are no restrictions or special instructions.  Follow-Up: Your physician wants you to follow-up in: 6 months with Dr. Turner. You will receive a reminder letter in the mail two months in advance. If you don't receive a letter, please call our office to schedule the follow-up appointment.   Any Other Special Instructions Will Be Listed Below (If Applicable).     If you need a refill on your cardiac medications before your next appointment, please call your pharmacy.   

## 2015-07-16 ENCOUNTER — Encounter: Payer: Self-pay | Admitting: Cardiology

## 2015-07-18 ENCOUNTER — Ambulatory Visit (HOSPITAL_COMMUNITY)
Admission: RE | Admit: 2015-07-18 | Discharge: 2015-07-18 | Disposition: A | Discharge status: Home / Self Care | Payer: Medicare Other | Source: Ambulatory Visit | Attending: Cardiology | Admitting: Cardiology

## 2015-07-18 DIAGNOSIS — E785 Hyperlipidemia, unspecified: Secondary | ICD-10-CM | POA: Diagnosis not present

## 2015-07-18 DIAGNOSIS — G4733 Obstructive sleep apnea (adult) (pediatric): Secondary | ICD-10-CM | POA: Diagnosis not present

## 2015-07-18 DIAGNOSIS — I129 Hypertensive chronic kidney disease with stage 1 through stage 4 chronic kidney disease, or unspecified chronic kidney disease: Secondary | ICD-10-CM | POA: Insufficient documentation

## 2015-07-18 DIAGNOSIS — N183 Chronic kidney disease, stage 3 (moderate): Secondary | ICD-10-CM | POA: Diagnosis not present

## 2015-07-18 DIAGNOSIS — E1122 Type 2 diabetes mellitus with diabetic chronic kidney disease: Secondary | ICD-10-CM | POA: Insufficient documentation

## 2015-07-18 DIAGNOSIS — I6523 Occlusion and stenosis of bilateral carotid arteries: Secondary | ICD-10-CM | POA: Insufficient documentation

## 2015-07-18 DIAGNOSIS — I6529 Occlusion and stenosis of unspecified carotid artery: Secondary | ICD-10-CM

## 2015-07-19 ENCOUNTER — Encounter: Payer: Self-pay | Admitting: Cardiology

## 2015-07-21 ENCOUNTER — Telehealth: Payer: Self-pay | Admitting: Cardiology

## 2015-07-21 DIAGNOSIS — I6523 Occlusion and stenosis of bilateral carotid arteries: Secondary | ICD-10-CM

## 2015-07-21 DIAGNOSIS — H401132 Primary open-angle glaucoma, bilateral, moderate stage: Secondary | ICD-10-CM | POA: Diagnosis not present

## 2015-07-21 NOTE — Telephone Encounter (Signed)
-----   Message from Sueanne Margarita, MD sent at 07/19/2015 10:38 PM EDT ----- 40-59% bilateral stenosis which has increased from prior study - repeat study in 1 year

## 2015-07-21 NOTE — Telephone Encounter (Signed)
Informed patient of results and verbal understanding expressed.  Repeat carotids ordered to be scheduled in 1 year. Patient agrees with treatment plan. 

## 2015-07-21 NOTE — Telephone Encounter (Signed)
New message   Pt calling he verbalized that he received a call to call back for his lab results

## 2015-07-25 DIAGNOSIS — E0500 Thyrotoxicosis with diffuse goiter without thyrotoxic crisis or storm: Secondary | ICD-10-CM | POA: Diagnosis not present

## 2015-07-25 DIAGNOSIS — R001 Bradycardia, unspecified: Secondary | ICD-10-CM | POA: Diagnosis not present

## 2015-07-25 DIAGNOSIS — E890 Postprocedural hypothyroidism: Secondary | ICD-10-CM | POA: Diagnosis not present

## 2015-07-28 DIAGNOSIS — R3915 Urgency of urination: Secondary | ICD-10-CM | POA: Diagnosis not present

## 2015-07-28 DIAGNOSIS — N401 Enlarged prostate with lower urinary tract symptoms: Secondary | ICD-10-CM | POA: Diagnosis not present

## 2015-07-28 DIAGNOSIS — Z0000 Encounter for general adult medical examination without abnormal findings: Secondary | ICD-10-CM | POA: Diagnosis not present

## 2015-07-28 DIAGNOSIS — N138 Other obstructive and reflux uropathy: Secondary | ICD-10-CM | POA: Diagnosis not present

## 2015-08-18 DIAGNOSIS — N189 Chronic kidney disease, unspecified: Secondary | ICD-10-CM | POA: Diagnosis not present

## 2015-08-18 DIAGNOSIS — D631 Anemia in chronic kidney disease: Secondary | ICD-10-CM | POA: Diagnosis not present

## 2015-08-18 DIAGNOSIS — N2581 Secondary hyperparathyroidism of renal origin: Secondary | ICD-10-CM | POA: Diagnosis not present

## 2015-08-18 DIAGNOSIS — N183 Chronic kidney disease, stage 3 (moderate): Secondary | ICD-10-CM | POA: Diagnosis not present

## 2015-09-11 DIAGNOSIS — E0500 Thyrotoxicosis with diffuse goiter without thyrotoxic crisis or storm: Secondary | ICD-10-CM | POA: Diagnosis not present

## 2015-09-11 DIAGNOSIS — E890 Postprocedural hypothyroidism: Secondary | ICD-10-CM | POA: Diagnosis not present

## 2015-09-19 ENCOUNTER — Other Ambulatory Visit: Payer: Self-pay | Admitting: Cardiology

## 2015-09-19 MED ORDER — POTASSIUM CHLORIDE CRYS ER 10 MEQ PO TBCR
10.0000 meq | EXTENDED_RELEASE_TABLET | Freq: Every day | ORAL | Status: DC
Start: 2015-09-19 — End: 2016-11-01

## 2015-09-29 ENCOUNTER — Other Ambulatory Visit: Payer: Self-pay | Admitting: *Deleted

## 2015-09-29 MED ORDER — FUROSEMIDE 20 MG PO TABS: 20.0000 mg | ORAL_TABLET | Freq: Every day | ORAL | Status: DC

## 2015-10-16 ENCOUNTER — Other Ambulatory Visit (HOSPITAL_COMMUNITY): Payer: Self-pay | Admitting: Cardiology

## 2015-10-20 DIAGNOSIS — H43812 Vitreous degeneration, left eye: Secondary | ICD-10-CM | POA: Diagnosis not present

## 2015-10-20 DIAGNOSIS — H4010X2 Unspecified open-angle glaucoma, moderate stage: Secondary | ICD-10-CM | POA: Diagnosis not present

## 2015-10-20 DIAGNOSIS — H34831 Tributary (branch) retinal vein occlusion, right eye, with macular edema: Secondary | ICD-10-CM | POA: Diagnosis not present

## 2015-10-20 DIAGNOSIS — H348312 Tributary (branch) retinal vein occlusion, right eye, stable: Secondary | ICD-10-CM | POA: Diagnosis not present

## 2015-10-20 DIAGNOSIS — H401132 Primary open-angle glaucoma, bilateral, moderate stage: Secondary | ICD-10-CM | POA: Diagnosis not present

## 2015-11-24 DIAGNOSIS — H401132 Primary open-angle glaucoma, bilateral, moderate stage: Secondary | ICD-10-CM | POA: Diagnosis not present

## 2015-12-30 ENCOUNTER — Encounter: Payer: Self-pay | Admitting: Cardiology

## 2016-01-13 ENCOUNTER — Ambulatory Visit (INDEPENDENT_AMBULATORY_CARE_PROVIDER_SITE_OTHER): Payer: Medicare Other | Admitting: Cardiology

## 2016-01-13 ENCOUNTER — Encounter: Payer: Self-pay | Admitting: Cardiology

## 2016-01-13 ENCOUNTER — Encounter (INDEPENDENT_AMBULATORY_CARE_PROVIDER_SITE_OTHER): Payer: Self-pay

## 2016-01-13 VITALS — BP 150/60 | HR 57 | Ht 69.0 in | Wt 142.0 lb

## 2016-01-13 DIAGNOSIS — R001 Bradycardia, unspecified: Secondary | ICD-10-CM

## 2016-01-13 DIAGNOSIS — I5032 Chronic diastolic (congestive) heart failure: Secondary | ICD-10-CM | POA: Diagnosis not present

## 2016-01-13 DIAGNOSIS — I2510 Atherosclerotic heart disease of native coronary artery without angina pectoris: Secondary | ICD-10-CM

## 2016-01-13 DIAGNOSIS — I77810 Thoracic aortic ectasia: Secondary | ICD-10-CM

## 2016-01-13 DIAGNOSIS — I340 Nonrheumatic mitral (valve) insufficiency: Secondary | ICD-10-CM

## 2016-01-13 DIAGNOSIS — I10 Essential (primary) hypertension: Secondary | ICD-10-CM

## 2016-01-13 DIAGNOSIS — I272 Pulmonary hypertension, unspecified: Secondary | ICD-10-CM

## 2016-01-13 DIAGNOSIS — I6529 Occlusion and stenosis of unspecified carotid artery: Secondary | ICD-10-CM

## 2016-01-13 DIAGNOSIS — E7800 Pure hypercholesterolemia, unspecified: Secondary | ICD-10-CM

## 2016-01-13 NOTE — Patient Instructions (Signed)
Medication Instructions:  Your physician recommends that you continue on your current medications as directed. Please refer to the Current Medication list given to you today.   Labwork: Your physician recommends that you return for FASTING lab work in a week.  Testing/Procedures: Your physician has requested that you have an echocardiogram in December, 2017. Echocardiography is a painless test that uses sound waves to create images of your heart. It provides your doctor with information about the size and shape of your heart and how well your heart's chambers and valves are working. This procedure takes approximately one hour. There are no restrictions for this procedure.  Follow-Up: Your physician wants you to follow-up in: 6 months with Dr. Radford Pax. You will receive a reminder letter in the mail two months in advance. If you don't receive a letter, please call our office to schedule the follow-up appointment.   Any Other Special Instructions Will Be Listed Below (If Applicable).     If you need a refill on your cardiac medications before your next appointment, please call your pharmacy.

## 2016-01-13 NOTE — Progress Notes (Signed)
Cardiology Office Note    Date:  01/13/2016   ID:  Jeffrey Campbell, DOB Jul 09, 1932, MRN [id number]  PCP:  Gerrit Heck, MD  Cardiologist:  Fransico Him, MD   No chief complaint on file.   History of Present Illness:  Jeffrey Campbell is a 82 y.o. male with a history of ASCAD ([place] 2012 with 50-60% ostial LAD, 50-60%mLAD, 60% pRCA), HTN, dyslipidemia, mild MR with posterior MVP, bradycardia and moderate pulmonary HTN presents today for followup.He is followed by Dr. Aundra Dubin for pulmonary HTN.  He has had an extensive w/u for pulmonary HTN (Group 2 from pulmonary venous HTN in setting of elevated left heart pressure) with PFTs showing mild COPD but moderate to severely reduced DLCO, neg VQ scan for PE and chest CT with no interstitial lung disease.  He does have OSA but is intolerant to CPAP.  He also has bilateral renal artery stenosis, mesenteric artery stenosis and distal aortic stenosis followed by Dr. Fletcher Anon. [place] 01/2015 showed mild pulmonary venous HTN with prominent V waves in the PCWP and RA.  2D echo showed mild MR with MVP so prominent V waves felt to be due to stiff ventricles/diastolic dysfunction in setting of poorly controlled HTN.   He has chronic diastolic CHF. He presents back today doing well.  He denies anychest pain, SOB, DOE, LE edema, palpitations, dizziness or syncope.     Past Medical History:  Diagnosis Date  . BPH (benign prostatic hyperplasia)   . CAD (coronary artery disease)   . Carotid artery stenosis    40-59% bilateral  . CKD (chronic kidney disease), stage III   . Diastolic dysfunction   . Glaucoma   . Graves disease   . Heart murmur, systolic   . History of ETOH abuse   . History of PFTs 05/2010   moderate airflow obstruction w reduced DLCO by PFT   . Hyperlipemia   . Hypertension   . Hyperthyroidism 08/26/10   radioactive iodine therapy   . Mitral regurgitation echo 2015   mild  . Multiple thyroid nodules   . MVP (mitral valve prolapse)  11/2012   posterior MVP  . Organic impotence   . OSA (obstructive sleep apnea)    upper airway resistance syndrome with RDI 18/hr - not on CPAP due to insurance not covering  . PUD (peptic ulcer disease)   . Pulmonary hypertension echo 2015   moderate PASP 69mmHg  . Shutdown renal    after cardiac cath  . Type 2 diabetes, diet controlled ([place])   . Upper airway resistance syndrome     Past Surgical History:  Procedure Laterality Date  . APPENDECTOMY    . CARDIAC CATHETERIZATION    . CARDIAC CATHETERIZATION N/A 02/17/2015   Procedure: Right Heart Cath;  Surgeon: Larey Dresser, MD;  Location: [facility];  Service: Cardiovascular;  Laterality: N/A;  . heart catherization    . HERNIA REPAIR  10/2009    Current Medications: Outpatient Medications Prior to Visit  Medication Sig Dispense Refill  . amLODipine-atorvastatin (CADUET) 10-40 MG tablet Take 1 tablet by mouth daily. 90 tablet 3  . aspirin 81 MG tablet Take 81 mg by mouth daily.    . Brinzolamide-Brimonidine (SIMBRINZA) 1-0.2 % SUSP Apply 1 drop to eye 2 (two) times daily.    Marland Kitchen doxazosin (CARDURA) 2 MG tablet Take 3 tablets (6 mg total) by mouth daily. 270 tablet 3  . furosemide (LASIX) 20 MG tablet Take 1 tablet (20 mg  total) by mouth daily. KEEP OV. 90 tablet 3  . hydrALAZINE (APRESOLINE) 100 MG tablet TAKE 1 TABLET (100 MG TOTAL) BY MOUTH 3 (THREE) TIMES DAILY. 90 tablet 3  . levothyroxine (SYNTHROID, LEVOTHROID) 112 MCG tablet Take 112 mcg by mouth daily before breakfast.  5  . Multiple Vitamin (MULTIVITAMIN) tablet Take 1 tablet by mouth daily.      . nitroGLYCERIN (NITROSTAT) 0.4 MG SL tablet Place 0.4 mg under the tongue every 5 (five) minutes as needed for chest pain.     . potassium chloride (K-DUR,KLOR-CON) 10 MEQ tablet Take 1 tablet (10 mEq total) by mouth daily. 90 tablet 2   No facility-administered medications prior to visit.      Allergies:   Tiotropium bromide monohydrate; Budesonide-formoterol  fumarate; Fenofibrate micronized; Other; Tiotropium; Tricor [fenofibrate]; and Zocor [simvastatin]   Social History   Social History  . Marital status: Married    Spouse name: N/A  . Number of children: 4  . Years of education: N/A   Occupational History  . Retired Retired    [employer]   Social History Main Topics  . Smoking status: Former Smoker    Packs/day: 2.00    Years: 40.00    Types: Cigarettes    Quit date: 03/29/1984  . Smokeless tobacco: Never Used  . Alcohol use No     Comment: quit in 1981  . Drug use: No  . Sexual activity: Not Asked   Other Topics Concern  . None   Social History Narrative  . None     Family History:  The patient's family history includes Colon cancer in his brother, maternal uncle, and mother; Hypertension in his father; Kidney failure in his father; Lung disease in his brother.   ROS:   Please see the history of present illness.    ROS All other systems reviewed and are negative.  No flowsheet data found.     PHYSICAL EXAM:   VS:  BP (!) 150/60   Pulse (!) 57   Ht 5\' 9"  (1.753 m)   Wt 142 lb (64.4 kg)   BMI 20.97 kg/m    GEN: Well nourished, well developed, in no acute distress  HEENT: normal  Neck: no JVD or masses.  Bilateral carotid bruits Cardiac: RRR; no rubs, or gallops,no edema.  Intact distal pulses bilaterally. Occasional ectopy.   Respiratory:  clear to auscultation bilaterally, normal work of breathing GI: soft, nontender, nondistended, + BS MS: no deformity or atrophy  Skin: warm and dry, no rash Neuro:  Alert and Oriented x 3, Strength and sensation are intact Psych: euthymic mood, full affect  Wt Readings from Last 3 Encounters:  01/13/16 142 lb (64.4 kg)  07/14/15 147 lb 12.8 oz (67 kg)  03/18/15 148 lb 1.9 oz (67.2 kg)      Studies/Labs Reviewed:   EKG:  EKG is  ordered today and shows sinus bradycardia with PVCs   Recent Labs: 02/10/2015: Hemoglobin 11.0; Platelets 167 03/03/2015: B  Natriuretic Peptide 801.8; BUN 44; Creatinine, Ser 2.63; Potassium 4.2; Sodium 140   Lipid Panel    Component Value Date/Time   CHOL 145 07/15/2014 1338   TRIG 62.0 07/15/2014 1338   HDL 63.10 07/15/2014 1338   CHOLHDL 2 07/15/2014 1338   VLDL 12.4 07/15/2014 1338   [place] 70 07/15/2014 1338    Additional studies/ records that were reviewed today include:  none    ASSESSMENT:    1. Pulmonary hypertension   2. Non-rheumatic  mitral regurgitation   3. Essential hypertension   4. Chronic diastolic CHF (congestive heart failure) ([place])   5. Coronary artery disease involving native coronary artery of native heart without angina pectoris   6. Pure hypercholesterolemia   7. Bradycardia      PLAN:  In order of problems listed above:  1. Pulmonary HTN - RHC showed mild pulmonary venous hypertension, PVR was only 1.8 WU.   There is no specific treatment beyond diuresis.  This is Group 2 secondary to pulmonary venous hypertension from diastolic dysfunction.   2. MVP with MR bu echo 02/2015.  Continue diuretic.  3. HTN - BP slightly increased but he did not take his meds last night.  Continue amlodipine/cardura and hydralazine.  4. Chronic diastolic CHF - he appears euvolemic on exam today.  Continue diuretic.  5. ASCAD 50-60%mLAD, 60% pRCA with no angina. Continue ASA.  6. Hyperlipidemia with LDL goal < 70.  Continue statin. 7. Bradycardia asymptomatic.   8. Dilated aortic root at 44mm by last echo - repeat 02/2016.      Medication Adjustments/Labs and Tests Ordered: Current medicines are reviewed at length with the patient today.  Concerns regarding medicines are outlined above.  Medication changes, Labs and Tests ordered today are listed in the Patient Instructions below.  There are no Patient Instructions on file for this visit.   Signed, Fransico Him, MD  01/13/2016 8:54 AM    [address], [place], [place]  [id number] Phone: [phone]; Fax: [phone]

## 2016-01-20 ENCOUNTER — Other Ambulatory Visit: Payer: Medicare Other | Admitting: *Deleted

## 2016-01-20 DIAGNOSIS — E7800 Pure hypercholesterolemia, unspecified: Secondary | ICD-10-CM | POA: Diagnosis not present

## 2016-01-20 DIAGNOSIS — I5032 Chronic diastolic (congestive) heart failure: Secondary | ICD-10-CM | POA: Diagnosis not present

## 2016-01-20 DIAGNOSIS — I10 Essential (primary) hypertension: Secondary | ICD-10-CM | POA: Diagnosis not present

## 2016-01-20 LAB — HEPATIC FUNCTION PANEL
ALBUMIN: 4 g/dL (ref 3.6–5.1)
ALT: 13 U/L (ref 9–46)
AST: 18 U/L (ref 10–35)
Alkaline Phosphatase: 57 U/L (ref 40–115)
Bilirubin, Direct: 0.3 mg/dL — ABNORMAL HIGH (ref <=0.2)
Indirect Bilirubin: 0.9 mg/dL (ref 0.2–1.2)
Total Bilirubin: 1.2 mg/dL (ref 0.2–1.2)
Total Protein: 6.5 g/dL (ref 6.1–8.1)

## 2016-01-20 LAB — LIPID PANEL
CHOLESTEROL: 112 mg/dL — AB (ref 125–200)
HDL: 58 mg/dL (ref >=40)
LDL Cholesterol: 43 mg/dL (ref <130)
Total CHOL/HDL Ratio: 1.9 Ratio (ref <=5.0)
Triglycerides: 53 mg/dL (ref <150)
VLDL: 11 mg/dL (ref <30)

## 2016-01-20 LAB — BASIC METABOLIC PANEL
BUN: 38 mg/dL — AB (ref 7–25)
CHLORIDE: 109 mmol/L (ref 98–110)
CO2: 26 mmol/L (ref 20–31)
Calcium: 9.4 mg/dL (ref 8.6–10.3)
Creat: 2.64 mg/dL — ABNORMAL HIGH (ref 0.70–1.11)
Glucose, Bld: 103 mg/dL — ABNORMAL HIGH (ref 65–99)
POTASSIUM: 4.4 mmol/L (ref 3.5–5.3)
SODIUM: 141 mmol/L (ref 135–146)

## 2016-01-22 DIAGNOSIS — H0520 Unspecified exophthalmos: Secondary | ICD-10-CM | POA: Diagnosis not present

## 2016-01-22 DIAGNOSIS — H401132 Primary open-angle glaucoma, bilateral, moderate stage: Secondary | ICD-10-CM | POA: Diagnosis not present

## 2016-01-30 DIAGNOSIS — E890 Postprocedural hypothyroidism: Secondary | ICD-10-CM | POA: Diagnosis not present

## 2016-01-30 DIAGNOSIS — R001 Bradycardia, unspecified: Secondary | ICD-10-CM | POA: Diagnosis not present

## 2016-01-30 DIAGNOSIS — E0500 Thyrotoxicosis with diffuse goiter without thyrotoxic crisis or storm: Secondary | ICD-10-CM | POA: Diagnosis not present

## 2016-01-30 DIAGNOSIS — R634 Abnormal weight loss: Secondary | ICD-10-CM | POA: Diagnosis not present

## 2016-01-30 DIAGNOSIS — Z23 Encounter for immunization: Secondary | ICD-10-CM | POA: Diagnosis not present

## 2016-02-09 ENCOUNTER — Other Ambulatory Visit: Payer: Self-pay | Admitting: Cardiology

## 2016-02-10 DIAGNOSIS — N183 Chronic kidney disease, stage 3 (moderate): Secondary | ICD-10-CM | POA: Diagnosis not present

## 2016-02-10 DIAGNOSIS — D631 Anemia in chronic kidney disease: Secondary | ICD-10-CM | POA: Diagnosis not present

## 2016-02-10 DIAGNOSIS — N2581 Secondary hyperparathyroidism of renal origin: Secondary | ICD-10-CM | POA: Diagnosis not present

## 2016-02-11 DIAGNOSIS — H34831 Tributary (branch) retinal vein occlusion, right eye, with macular edema: Secondary | ICD-10-CM | POA: Diagnosis not present

## 2016-02-11 DIAGNOSIS — H4010X3 Unspecified open-angle glaucoma, severe stage: Secondary | ICD-10-CM | POA: Diagnosis not present

## 2016-02-11 DIAGNOSIS — H43812 Vitreous degeneration, left eye: Secondary | ICD-10-CM | POA: Diagnosis not present

## 2016-02-11 DIAGNOSIS — H348312 Tributary (branch) retinal vein occlusion, right eye, stable: Secondary | ICD-10-CM | POA: Diagnosis not present

## 2016-02-16 DIAGNOSIS — I110 Hypertensive heart disease with heart failure: Secondary | ICD-10-CM | POA: Diagnosis not present

## 2016-02-16 DIAGNOSIS — I2510 Atherosclerotic heart disease of native coronary artery without angina pectoris: Secondary | ICD-10-CM | POA: Diagnosis not present

## 2016-02-16 DIAGNOSIS — H4720 Unspecified optic atrophy: Secondary | ICD-10-CM | POA: Diagnosis not present

## 2016-02-16 DIAGNOSIS — I509 Heart failure, unspecified: Secondary | ICD-10-CM | POA: Diagnosis not present

## 2016-02-16 DIAGNOSIS — Z888 Allergy status to other drugs, medicaments and biological substances status: Secondary | ICD-10-CM | POA: Diagnosis not present

## 2016-02-16 DIAGNOSIS — E785 Hyperlipidemia, unspecified: Secondary | ICD-10-CM | POA: Diagnosis not present

## 2016-02-16 DIAGNOSIS — Z961 Presence of intraocular lens: Secondary | ICD-10-CM | POA: Diagnosis not present

## 2016-02-16 DIAGNOSIS — H02533 Eyelid retraction right eye, unspecified eyelid: Secondary | ICD-10-CM | POA: Diagnosis not present

## 2016-02-16 DIAGNOSIS — H02536 Eyelid retraction left eye, unspecified eyelid: Secondary | ICD-10-CM | POA: Diagnosis not present

## 2016-02-16 DIAGNOSIS — Z79899 Other long term (current) drug therapy: Secondary | ICD-10-CM | POA: Diagnosis not present

## 2016-02-16 DIAGNOSIS — H0520 Unspecified exophthalmos: Secondary | ICD-10-CM | POA: Diagnosis not present

## 2016-02-16 DIAGNOSIS — Z9842 Cataract extraction status, left eye: Secondary | ICD-10-CM | POA: Diagnosis not present

## 2016-02-16 DIAGNOSIS — Z9841 Cataract extraction status, right eye: Secondary | ICD-10-CM | POA: Diagnosis not present

## 2016-02-16 DIAGNOSIS — H401194 Primary open-angle glaucoma, unspecified eye, indeterminate stage: Secondary | ICD-10-CM | POA: Diagnosis not present

## 2016-02-16 DIAGNOSIS — Z87891 Personal history of nicotine dependence: Secondary | ICD-10-CM | POA: Diagnosis not present

## 2016-02-16 DIAGNOSIS — Z7982 Long term (current) use of aspirin: Secondary | ICD-10-CM | POA: Diagnosis not present

## 2016-03-04 ENCOUNTER — Other Ambulatory Visit (HOSPITAL_COMMUNITY): Payer: Self-pay | Admitting: Cardiology

## 2016-03-05 DIAGNOSIS — H0520 Unspecified exophthalmos: Secondary | ICD-10-CM | POA: Diagnosis not present

## 2016-03-05 DIAGNOSIS — H4720 Unspecified optic atrophy: Secondary | ICD-10-CM | POA: Diagnosis not present

## 2016-03-08 DIAGNOSIS — H02533 Eyelid retraction right eye, unspecified eyelid: Secondary | ICD-10-CM | POA: Diagnosis not present

## 2016-03-08 DIAGNOSIS — H0520 Unspecified exophthalmos: Secondary | ICD-10-CM | POA: Diagnosis not present

## 2016-03-08 DIAGNOSIS — I110 Hypertensive heart disease with heart failure: Secondary | ICD-10-CM | POA: Diagnosis not present

## 2016-03-08 DIAGNOSIS — I509 Heart failure, unspecified: Secondary | ICD-10-CM | POA: Diagnosis not present

## 2016-03-08 DIAGNOSIS — Z79899 Other long term (current) drug therapy: Secondary | ICD-10-CM | POA: Diagnosis not present

## 2016-03-08 DIAGNOSIS — I2510 Atherosclerotic heart disease of native coronary artery without angina pectoris: Secondary | ICD-10-CM | POA: Diagnosis not present

## 2016-03-08 DIAGNOSIS — Z888 Allergy status to other drugs, medicaments and biological substances status: Secondary | ICD-10-CM | POA: Diagnosis not present

## 2016-03-08 DIAGNOSIS — I10 Essential (primary) hypertension: Secondary | ICD-10-CM | POA: Diagnosis not present

## 2016-03-08 DIAGNOSIS — H4720 Unspecified optic atrophy: Secondary | ICD-10-CM | POA: Diagnosis not present

## 2016-03-08 DIAGNOSIS — E079 Disorder of thyroid, unspecified: Secondary | ICD-10-CM | POA: Diagnosis not present

## 2016-03-08 DIAGNOSIS — Z961 Presence of intraocular lens: Secondary | ICD-10-CM | POA: Diagnosis not present

## 2016-03-08 DIAGNOSIS — H401194 Primary open-angle glaucoma, unspecified eye, indeterminate stage: Secondary | ICD-10-CM | POA: Diagnosis not present

## 2016-03-08 DIAGNOSIS — E785 Hyperlipidemia, unspecified: Secondary | ICD-10-CM | POA: Diagnosis not present

## 2016-03-08 DIAGNOSIS — H02536 Eyelid retraction left eye, unspecified eyelid: Secondary | ICD-10-CM | POA: Diagnosis not present

## 2016-03-08 DIAGNOSIS — Z87891 Personal history of nicotine dependence: Secondary | ICD-10-CM | POA: Diagnosis not present

## 2016-03-15 ENCOUNTER — Ambulatory Visit (HOSPITAL_COMMUNITY): Payer: Medicare Other | Attending: Cardiology

## 2016-03-15 ENCOUNTER — Other Ambulatory Visit: Payer: Self-pay

## 2016-03-15 DIAGNOSIS — I272 Pulmonary hypertension, unspecified: Secondary | ICD-10-CM | POA: Insufficient documentation

## 2016-03-15 DIAGNOSIS — I77810 Thoracic aortic ectasia: Secondary | ICD-10-CM | POA: Diagnosis not present

## 2016-03-15 DIAGNOSIS — I5032 Chronic diastolic (congestive) heart failure: Secondary | ICD-10-CM | POA: Diagnosis not present

## 2016-03-15 DIAGNOSIS — I081 Rheumatic disorders of both mitral and tricuspid valves: Secondary | ICD-10-CM | POA: Insufficient documentation

## 2016-03-15 DIAGNOSIS — E0590 Thyrotoxicosis, unspecified without thyrotoxic crisis or storm: Secondary | ICD-10-CM | POA: Diagnosis not present

## 2016-03-18 ENCOUNTER — Telehealth: Payer: Self-pay

## 2016-03-18 DIAGNOSIS — I272 Pulmonary hypertension, unspecified: Secondary | ICD-10-CM

## 2016-03-18 DIAGNOSIS — I10 Essential (primary) hypertension: Secondary | ICD-10-CM

## 2016-03-18 MED ORDER — FUROSEMIDE 40 MG PO TABS: 40.0000 mg | ORAL_TABLET | Freq: Every day | ORAL | 3 refills | Status: DC

## 2016-03-18 NOTE — Addendum Note (Signed)
Addended by: Harland German A on: 03/18/2016 11:20 AM   Modules accepted: Orders

## 2016-03-18 NOTE — Telephone Encounter (Signed)
Informed patient of results and verbal understanding expressed.  Repeat ECHO ordered to be scheduled in 1 year. He understands he will be called if Dr. Radford Pax has further recommendations.

## 2016-03-18 NOTE — Telephone Encounter (Signed)
-----   Message from Sueanne Margarita, MD sent at 03/15/2016 11:35 PM EST ----- Echo showed normal LVF with moderate LVH, increased stiffness of heart muscle, mildly thickened MV leaflets with moderate MR and severe pulmonary HTN - repeat echo in 1 year.  I will discuss this with Dr. Aundra Dubin

## 2016-03-18 NOTE — Telephone Encounter (Signed)
Notes Recorded by Sueanne Margarita, MD on 03/18/2016 at 10:56 AM EST Echo showed worsening pulmonary HTN - please have patient increase Lasix to 40mg  BID for 3 days and then 40mg  daily. Have him followup with extender in 2 weeks. Please get BMET on Tuesday 12/26. Also please find out if patient has a nephrologist ------    Instructed patient to INCREASE LASIX to 40 mg BID for 3 days and then 40 mg daily. BMET scheduled 12/26. Check-up scheduled with B. [place], [place] 04/02/16. His nephrologist is Dr. [name] with [facility].

## 2016-03-23 ENCOUNTER — Other Ambulatory Visit: Payer: Medicare Other | Admitting: *Deleted

## 2016-03-23 DIAGNOSIS — I272 Pulmonary hypertension, unspecified: Secondary | ICD-10-CM | POA: Diagnosis not present

## 2016-03-23 DIAGNOSIS — I10 Essential (primary) hypertension: Secondary | ICD-10-CM | POA: Diagnosis not present

## 2016-03-23 LAB — BASIC METABOLIC PANEL
BUN: 51 mg/dL — AB (ref 7–25)
CALCIUM: 9.1 mg/dL (ref 8.6–10.3)
CHLORIDE: 111 mmol/L — AB (ref 98–110)
CO2: 23 mmol/L (ref 20–31)
CREATININE: 2.68 mg/dL — AB (ref 0.70–1.11)
Glucose, Bld: 98 mg/dL (ref 65–99)
Potassium: 4.6 mmol/L (ref 3.5–5.3)
Sodium: 142 mmol/L (ref 135–146)

## 2016-03-29 ENCOUNTER — Other Ambulatory Visit (HOSPITAL_COMMUNITY): Payer: Self-pay | Admitting: Cardiology

## 2016-03-30 ENCOUNTER — Telehealth: Payer: Self-pay | Admitting: Cardiology

## 2016-03-30 ENCOUNTER — Other Ambulatory Visit (HOSPITAL_COMMUNITY): Payer: Self-pay | Admitting: *Deleted

## 2016-03-30 MED ORDER — HYDRALAZINE HCL 100 MG PO TABS: ORAL_TABLET | ORAL | 3 refills | Status: DC

## 2016-03-30 NOTE — Telephone Encounter (Signed)
New Message    *STAT* If patient is at the pharmacy, call can be transferred to refill team.   1. Which medications need to be refilled? (please list name of each medication and dose if known)   hydrALAZINE (APRESOLINE) 100 MG tablet     2. Which pharmacy/location (including street and city if local pharmacy) is medication to be sent to? [facility] [address] [facility]   3. Do they need a 30 day or 90 day supply? 30 day

## 2016-04-02 ENCOUNTER — Ambulatory Visit (INDEPENDENT_AMBULATORY_CARE_PROVIDER_SITE_OTHER): Payer: Medicare Other | Admitting: Cardiology

## 2016-04-02 ENCOUNTER — Other Ambulatory Visit (HOSPITAL_COMMUNITY): Payer: Self-pay | Admitting: Cardiology

## 2016-04-02 ENCOUNTER — Encounter: Payer: Self-pay | Admitting: Cardiology

## 2016-04-02 VITALS — BP 170/90 | HR 46 | Ht 69.0 in | Wt 140.2 lb

## 2016-04-02 DIAGNOSIS — I10 Essential (primary) hypertension: Secondary | ICD-10-CM

## 2016-04-02 DIAGNOSIS — I272 Pulmonary hypertension, unspecified: Secondary | ICD-10-CM

## 2016-04-02 NOTE — Progress Notes (Signed)
04/02/2016 [name]   11-Jun-1932  [id number]  Primary Physician Gerrit Heck, MD Primary Cardiologist: Dr. Jorge Mandril PV: Dr. Fletcher Anon    Reason for Visit/CC: Pulmonary HTN  HPI:  TRIP CAVANAGH is a 83 y.o. male with a history of ASCAD ([place] 2012 with 50-60% ostial LAD, 50-60%mLAD, 60% pRCA), HTN, dyslipidemia, mild MR with posterior MVP, bradycardia and moderate pulmonary HTN presents today for followup.He is followed by Dr. Aundra Dubin for pulmonary HTN. He has had an extensive w/u for pulmonary HTN (Group 2 from pulmonary venous HTN in setting of elevated left heart pressure) with PFTs showing mild COPD but moderate to severely reduced DLCO, neg VQ scan for PE and chest CT with no interstitial lung disease. He does have OSA but is intolerant to CPAP. He also has bilateral renal artery stenosis, mesenteric artery stenosis and distal aortic stenosis followed by Dr. Fletcher Anon. [place] 01/2015 showed mild pulmonary venous HTN with prominent V waves in the PCWP and RA. 2D echo showed mild MR with MVP so prominent V waves felt to be due to stiff ventricles/diastolic dysfunction in setting of poorly controlled HTN. He has chronic diastolic CHF as a well as a dilated aortic root. He is also followed by Dr. Senaida Lange, nephrologist.   He was recently seen by Dr. Radford Pax last October. He was doing well w/o symptoms, however Dr. Radford Pax ordered for him to undergo a f/u echo to reassess the status of his dilated aortic root. This was performed 03/15/16. Echo showed worsening pulmonary HTN and normal LVEF with LHV and grade 3 DD. His aortic root was noted to be "normal in size", per report. Moderate MR was noted. Given his increased pulmonary HTN, it was felt that he needed increased diuresis. He was instructed to INCREASE LASIX to 40 mg BID for 3 days and then 40 mg daily and to return 1 week later for a repeat BMP. Dr. Radford Pax also recommended repeat 2D echo in 1 year.  He presents back to clinic for  f/u. Unfortunately, he never increased his lasix as originally directed. He denies dyspnea, no chest pain. No orthopnea, PND or LEE. His BP is high today at 190/70. He has not yet taken any of his BP meds this morning. He takes his thyroid medicine first then waits 1-2 hrs before taking all of the others. He also notes that he had difficulties at the pharmacy, getting his hydralazine refilled, thus he has been w/o this for several days. A new Rx was just recently sent. He is asymptomatic with his elevated BP today. HR is in the mid 27s, which he is known to have a h/o. He denies dizziness, fatigue, syncope/ near syncope.   Current Meds  Medication Sig  . amLODipine-atorvastatin (CADUET) 10-40 MG tablet TAKE 1 TABLET BY MOUTH DAILY.  Marland Kitchen aspirin 81 MG tablet Take 81 mg by mouth daily.  . Brinzolamide-Brimonidine (SIMBRINZA) 1-0.2 % SUSP Apply 1 drop to eye 2 (two) times daily.  Marland Kitchen doxazosin (CARDURA) 2 MG tablet Take 3 tablets (6 mg total) by mouth daily.  . furosemide (LASIX) 40 MG tablet Take 1 tablet (40 mg total) by mouth daily.  . hydrALAZINE (APRESOLINE) 100 MG tablet TAKE 1 TABLET (100 MG TOTAL) BY MOUTH 3 (THREE) TIMES DAILY.  Marland Kitchen levothyroxine (SYNTHROID, LEVOTHROID) 112 MCG tablet Take 112 mcg by mouth daily before breakfast.  . Multiple Vitamin (MULTIVITAMIN) tablet Take 1 tablet by mouth daily.    . nitroGLYCERIN (NITROSTAT) 0.4 MG SL tablet Place 0.4  mg under the tongue every 5 (five) minutes as needed for chest pain.   . potassium chloride (K-DUR,KLOR-CON) 10 MEQ tablet Take 1 tablet (10 mEq total) by mouth daily.   Allergies  Allergen Reactions  . Tiotropium Bromide Monohydrate Other (See Comments)    Dry mouth  . Budesonide-Formoterol Fumarate     Pain in muscles, cramps in hands and legs  . Fenofibrate Micronized Other (See Comments)    Not sure  . Other     DYE- Causes kidney to shut down  . Tiotropium Other (See Comments)    Weight loss  . Tricor [Fenofibrate]     Unknown     . Zocor [Simvastatin] Other (See Comments)    Not sure Liver problems    Past Medical History:  Diagnosis Date  . BPH (benign prostatic hyperplasia)   . CAD (coronary artery disease)   . Carotid artery stenosis    40-59% bilateral  . CKD (chronic kidney disease), stage III   . Diastolic dysfunction   . Dilated aortic root ([place])    40mm by echo 02/2015  . Glaucoma   . Graves disease   . Heart murmur, systolic   . History of ETOH abuse   . History of PFTs 05/2010   moderate airflow obstruction w reduced DLCO by PFT   . Hyperlipemia   . Hypertension   . Hyperthyroidism 08/26/10   radioactive iodine therapy   . Mitral regurgitation echo 2015   mild  . Multiple thyroid nodules   . MVP (mitral valve prolapse) 11/2012   posterior MVP  . Organic impotence   . OSA (obstructive sleep apnea)    upper airway resistance syndrome with RDI 18/hr - not on CPAP due to insurance not covering  . PUD (peptic ulcer disease)   . Pulmonary hypertension echo 2015   moderate PASP 38mmHg  . Shutdown renal    after cardiac cath  . Type 2 diabetes, diet controlled ([place])   . Upper airway resistance syndrome    Family History  Problem Relation Age of Onset  . Kidney failure Father   . Hypertension Father   . Colon cancer Mother   . Colon cancer Brother   . Lung disease Brother   . Colon cancer Maternal Uncle    Past Surgical History:  Procedure Laterality Date  . APPENDECTOMY    . CARDIAC CATHETERIZATION    . CARDIAC CATHETERIZATION N/A 02/17/2015   Procedure: Right Heart Cath;  Surgeon: Larey Dresser, MD;  Location: [facility];  Service: Cardiovascular;  Laterality: N/A;  . heart catherization    . HERNIA REPAIR  10/2009   Social History   Social History  . Marital status: Married    Spouse name: N/A  . Number of children: 4  . Years of education: N/A   Occupational History  . Retired Retired    [employer]   Social History Main Topics  . Smoking status: Former  Smoker    Packs/day: 2.00    Years: 40.00    Types: Cigarettes    Quit date: 03/29/1984  . Smokeless tobacco: Never Used  . Alcohol use No     Comment: quit in 1981  . Drug use: No  . Sexual activity: Not on file   Other Topics Concern  . Not on file   Social History Narrative  . No narrative on file     Review of Systems: General: negative for chills, fever, night sweats or weight  changes.  Cardiovascular: negative for chest pain, dyspnea on exertion, edema, orthopnea, palpitations, paroxysmal nocturnal dyspnea or shortness of breath Dermatological: negative for rash Respiratory: negative for cough or wheezing Urologic: negative for hematuria Abdominal: negative for nausea, vomiting, diarrhea, bright red blood per rectum, melena, or hematemesis Neurologic: negative for visual changes, syncope, or dizziness All other systems reviewed and are otherwise negative except as noted above.   Physical Exam:  Blood pressure (!) 170/90, pulse (!) 46, height 5\' 9"  (1.753 m), weight 140 lb 3.2 oz (63.6 kg), SpO2 98 %.  General appearance: alert, cooperative and no distress Neck: no JVD and + bilarearl bruits, L>R Lungs: clear to auscultation bilaterally Heart: regular rate and rhythm and 2/6 Systolic murmur, loudest along LSB Extremities: extremities normal, atraumatic, no cyanosis or edema Pulses: 2+ and symmetric Skin: Skin color, texture, turgor normal. No rashes or lesions Neurologic: Grossly normal   2D Echo 03/15/16  Study Conclusions  - Left ventricle: The cavity size was normal. Wall thickness was   increased in a pattern of moderate LVH. Systolic function was   normal. The estimated ejection fraction was in the range of 60%   to 65%. Wall motion was normal; there were no regional wall   motion abnormalities. Doppler parameters are consistent with   restrictive left ventricular relaxation (grade 3 diastolic   dysfunction). The E/A ratio is >2. The E/e&' ratio is >20,    suggesting markedly elevated LV filling pressure. - Aortic valve: Trileaflet. Sclerosis without stenosis. There was   no regurgitation. - Mitral valve: Mildly thickened leaflets with bileaflet late   systolic prolapse. There was moderate regurgitation. - Left atrium: Severely dilated. - Right ventricle: The cavity size was mildly dilated. Systolic   function was normal. - Right atrium: Severely dilated. - Tricuspid valve: There was moderate regurgitation. - Pulmonary arteries: Dilated. PA peak pressure: 77 mm Hg (S). - Inferior vena cava: The vessel was dilated. The respirophasic   diameter changes were blunted (< 50%), consistent with elevated   central venous pressure.  Impressions:  - Compared to a prior study in 2016, there is now Grade 3 DD with   high LV filling pressures as well as severe pulmonary (likely   venous) hypertension wtih RVSP of 77 mmHg, moderate MR and TR and   severe biatrial enlargement.   ASSESSMENT AND PLAN:   1. Pulmonary HTN - This is Group 2 secondary to pulmonary venous hypertension from diastolic dysfunction.  [place] in 2016 showed mild pulmonary venous hypertension, PVR was only 1.8 WU. Recent echo 02/2016 showed severe pulmonary HTN. RVSP of 77 mmHg. There is no specific treatment beyond diuresis.  Pt never increased Lasix as recently advised via phone. We will plan to to this starting today. Increase lasix to 40 mg BID x 3 days, followed by 40 mg daily there after. repeat BMP in 1 week. .   2. HTN - BP slightly increased but he did not take his meds this morning..  Continue amlodipine/cardura and hydralazine. Increase lasix as outlined above.   3. Chronic diastolic CHF - Grade 3 on recent echo 02/2016. He appears euvolemic on exam today. No dyspnea.  Continue diuretic.   4. ASCAD - 50-60%mLAD, 60% pRCA with no angina. Continue ASA and statin.   5. Hyperlipidemia with LDL goal < 70 - Continue statin.  6. Bradycardia-  asymptomatic.              7.   Dilated aortic root - 6mm by echo  in 2016, however repeat echo 02/2016 mentioned. The aortic root                   was normal in size. Ascending aorta: The ascending aorta was normal in size.            8.  PAD - Renal artery stenosis, mesenteric artery stenosis, distal aortic stenosis.  Followed by Dr Fletcher Anon.            9. CKD: Stage III. Baseline Scr ~2.6. He is followed by Dr. Justin Mend. Repeat BMP in 1 week given temporary                increase in Lasix.     PLAN  F/u in 2 weeks to reassess.   Brittainy Simmons PA-C 04/02/2016 1:02 PM

## 2016-04-02 NOTE — Patient Instructions (Addendum)
Medication Instructions:  1) TAKE Furosemide 40mg  twice daily for 3 days and then decrease to Furosemide 40mg  once daily  Labwork: Your physician recommends that you return for lab work on Tuesday or Wednesday of next week. (BMET)   Testing/Procedures: None  Follow-Up: Your physician recommends that you schedule a follow-up appointment in: 2 weeks with Lyda Jester, PA-C.   Any Other Special Instructions Will Be Listed Below (If Applicable).     If you need a refill on your cardiac medications before your next appointment, please call your pharmacy.

## 2016-04-05 ENCOUNTER — Other Ambulatory Visit: Payer: Self-pay | Admitting: Cardiovascular Disease

## 2016-04-05 NOTE — Telephone Encounter (Signed)
Tried to contact pt. No answer. On vm, that it was someone named [employer] account planner. Pt seen Arida 03/18/2015. Was supposed to f/u in 30months. I see pt has been going to [address] office. Trying to confirm if pt plans to f/u with Arida or continue care at [address]. Due to refill request.

## 2016-04-05 NOTE — Telephone Encounter (Signed)
Pt just saw our provider in [place] on 04/05/16 He has a follow up as well.

## 2016-04-05 NOTE — Telephone Encounter (Signed)
Ok will forward to Secretary.

## 2016-04-05 NOTE — Telephone Encounter (Signed)
Please review for refill. Thanks!  

## 2016-04-07 ENCOUNTER — Other Ambulatory Visit: Payer: Medicare Other

## 2016-04-15 ENCOUNTER — Ambulatory Visit: Payer: Medicare Other | Admitting: Cardiology

## 2016-04-20 ENCOUNTER — Encounter: Payer: Self-pay | Admitting: Physician Assistant

## 2016-04-20 ENCOUNTER — Ambulatory Visit (INDEPENDENT_AMBULATORY_CARE_PROVIDER_SITE_OTHER): Payer: Medicare Other | Admitting: Physician Assistant

## 2016-04-20 VITALS — BP 166/60 | HR 52 | Ht 69.0 in | Wt 136.0 lb

## 2016-04-20 DIAGNOSIS — I739 Peripheral vascular disease, unspecified: Secondary | ICD-10-CM | POA: Insufficient documentation

## 2016-04-20 DIAGNOSIS — I2510 Atherosclerotic heart disease of native coronary artery without angina pectoris: Secondary | ICD-10-CM | POA: Diagnosis not present

## 2016-04-20 DIAGNOSIS — I5032 Chronic diastolic (congestive) heart failure: Secondary | ICD-10-CM | POA: Diagnosis not present

## 2016-04-20 DIAGNOSIS — I272 Pulmonary hypertension, unspecified: Secondary | ICD-10-CM

## 2016-04-20 DIAGNOSIS — N183 Chronic kidney disease, stage 3 unspecified: Secondary | ICD-10-CM

## 2016-04-20 NOTE — Patient Instructions (Addendum)
Medication Instructions:  Your physician recommends that you continue on your current medications as directed. Please refer to the Current Medication list given to you today.   Labwork: TODAY BMET, BNP  Testing/Procedures: NONE  Follow-Up: DR. Radford Pax 06/22/16 @ 10:45   Any Other Special Instructions Will Be Listed Below (If Applicable).     If you need a refill on your cardiac medications before your next appointment, please call your pharmacy.

## 2016-04-20 NOTE — Progress Notes (Signed)
Cardiology Office Note    Date:  04/20/2016   ID:  Jeffrey Campbell, DOB 01-05-1933, MRN [id number]  PCP:  Gerrit Heck, MD  Cardiologist: Dr. Radford Pax PV: Dr Fletcher Anon  Chief Complaint  Patient presents with  . Follow-up    History of Present Illness:  Jeffrey Campbell is a 83 y.o. male with a history of ASCAD ([place] 2012 with 50-60% ostial LAD, 50-60%mLAD, 60% pRCA), HTN, dyslipidemia, mild MR with posterior MVP, bradycardia and moderate pulmonary HTN.He has had an extensive w/u for pulmonary HTN (Group 2 from pulmonary venous HTN in setting of elevated left heart pressure) with PFTs showing mild COPD but moderate to severely reduced DLCO, neg VQ scan for PE and chest CT with no interstitial lung disease.  He does have OSA but is intolerant to CPAP.  He also has bilateral renal artery stenosis, mesenteric artery stenosis and distal aortic stenosis followed by Dr. Fletcher Anon. [place] 01/2015 showed mild pulmonary venous HTN with prominent V waves in the PCWP and RA.  2D echo showed mild MR with MVP so prominent V waves felt to be due to stiff ventricles/diastolic dysfunction in setting of poorly controlled HTN.   He has chronic diastolic CHF as a well as a dilated aortic root. He is also followed by Dr. Senaida Lange, nephrologist.   2-D echo 03/15/16 showed worsening pulmonary hypertension and normal LVEF with LVH and grade 3 DD. Aortic root was normal in size, moderate MR. Lasix was increased to 40 mg twice a day for 3 days and then once daily.  He saw [place] back on follow-up 04/02/16 and had not increased his Lasix. Blood pressure was quite high that day, he had missed his hydralazine for several days. Heart rate was in the 46 that day which is normal for him.  Patient is here today for follow-up. He says he's feeling a little stronger and his breathing is a bit better. His blood pressure is better but still low but high. He has not taken all his medications yet this morning. Yesterday his blood  pressure was about 115/60 this morning at home was 140/60.      Past Medical History:  Diagnosis Date  . BPH (benign prostatic hyperplasia)   . CAD (coronary artery disease)   . Carotid artery stenosis    40-59% bilateral  . CKD (chronic kidney disease), stage III   . Diastolic dysfunction   . Dilated aortic root ([place])    33mm by echo 02/2015  . Glaucoma   . Graves disease   . Heart murmur, systolic   . History of ETOH abuse   . History of PFTs 05/2010   moderate airflow obstruction w reduced DLCO by PFT   . Hyperlipemia   . Hypertension   . Hyperthyroidism 08/26/10   radioactive iodine therapy   . Mitral regurgitation echo 2015   mild  . Multiple thyroid nodules   . MVP (mitral valve prolapse) 11/2012   posterior MVP  . Organic impotence   . OSA (obstructive sleep apnea)    upper airway resistance syndrome with RDI 18/hr - not on CPAP due to insurance not covering  . PUD (peptic ulcer disease)   . Pulmonary hypertension echo 2015   moderate PASP 88mmHg  . Shutdown renal    after cardiac cath  . Type 2 diabetes, diet controlled ([place])   . Upper airway resistance syndrome     Past Surgical History:  Procedure Laterality Date  . APPENDECTOMY    .  CARDIAC CATHETERIZATION    . CARDIAC CATHETERIZATION N/A 02/17/2015   Procedure: Right Heart Cath;  Surgeon: Larey Dresser, MD;  Location: [facility];  Service: Cardiovascular;  Laterality: N/A;  . heart catherization    . HERNIA REPAIR  10/2009    Current Medications: Outpatient Medications Prior to Visit  Medication Sig Dispense Refill  . amLODipine-atorvastatin (CADUET) 10-40 MG tablet TAKE 1 TABLET BY MOUTH DAILY. 90 tablet 3  . aspirin 81 MG tablet Take 81 mg by mouth daily.    . Brinzolamide-Brimonidine (SIMBRINZA) 1-0.2 % SUSP Apply 1 drop to eye 2 (two) times daily.    Marland Kitchen doxazosin (CARDURA) 2 MG tablet Take 3 tablets (6 mg total) by mouth daily. *Please call and schedule an appointment with Dr Fletcher Anon* 270  tablet 0  . furosemide (LASIX) 40 MG tablet Take 1 tablet (40 mg total) by mouth daily. 90 tablet 3  . hydrALAZINE (APRESOLINE) 100 MG tablet TAKE 1 TABLET (100 MG TOTAL) BY MOUTH 3 (THREE) TIMES DAILY. 90 tablet 3  . hydrALAZINE (APRESOLINE) 100 MG tablet TAKE 1 TABLET (100 MG TOTAL) BY MOUTH 3 (THREE) TIMES DAILY. 90 tablet 3  . levothyroxine (SYNTHROID, LEVOTHROID) 112 MCG tablet Take 112 mcg by mouth daily before breakfast.  5  . Multiple Vitamin (MULTIVITAMIN) tablet Take 1 tablet by mouth daily.      . nitroGLYCERIN (NITROSTAT) 0.4 MG SL tablet Place 0.4 mg under the tongue every 5 (five) minutes as needed for chest pain.     . potassium chloride (K-DUR,KLOR-CON) 10 MEQ tablet Take 1 tablet (10 mEq total) by mouth daily. 90 tablet 2   No facility-administered medications prior to visit.      Allergies:   Tiotropium bromide monohydrate; Budesonide-formoterol fumarate; Fenofibrate micronized; Other; Tiotropium; Tricor [fenofibrate]; and Zocor [simvastatin]   Social History   Social History  . Marital status: Married    Spouse name: N/A  . Number of children: 4  . Years of education: N/A   Occupational History  . Retired Retired    [employer]   Social History Main Topics  . Smoking status: Former Smoker    Packs/day: 2.00    Years: 40.00    Types: Cigarettes    Quit date: 03/29/1984  . Smokeless tobacco: Never Used  . Alcohol use No     Comment: quit in 1981  . Drug use: No  . Sexual activity: Not Asked   Other Topics Concern  . None   Social History Narrative  . None     Family History:  The patient's family history includes Colon cancer in his brother, maternal uncle, and mother; Hypertension in his father; Kidney failure in his father; Lung disease in his brother.   ROS:   Please see the history of present illness.    Review of Systems  Constitution: Positive for weight loss.  HENT: Negative.   Eyes: Positive for visual disturbance.  Cardiovascular:  Positive for dyspnea on exertion.  Respiratory: Negative.   Endocrine: Negative.   Hematologic/Lymphatic: Negative.   Musculoskeletal: Negative.   Gastrointestinal: Negative.   Genitourinary: Negative.   Neurological: Negative.    All other systems reviewed and are negative.   PHYSICAL EXAM:   VS:  BP (!) 166/60 (BP Location: Left Arm, Patient Position: Sitting, Cuff Size: Normal)   Pulse (!) 52   Ht 5\' 9"  (1.753 m)   Wt 136 lb (61.7 kg)   BMI 20.08 kg/m   Physical Exam  GEN:  Thin, elderly, in no acute distress  Neck: no JVD, carotid bruits, or masses Cardiac:RRR; positive S4, 3-4 systolic murmur at the left sternal border,  Respiratory:  clear to auscultation bilaterally, normal work of breathing GI: soft, nontender, nondistended, + BS Ext: without cyanosis, clubbing, or edema, Good distal pulses bilaterally Psych: euthymic mood, full affect  Wt Readings from Last 3 Encounters:  04/20/16 136 lb (61.7 kg)  04/02/16 140 lb 3.2 oz (63.6 kg)  01/13/16 142 lb (64.4 kg)      Studies/Labs Reviewed:   EKG:  EKG is not ordered today.    Recent Labs: 01/20/2016: ALT 13 03/23/2016: BUN 51; Creat 2.68; Potassium 4.6; Sodium 142   Lipid Panel    Component Value Date/Time   CHOL 112 (L) 01/20/2016 0909   TRIG 53 01/20/2016 0909   HDL 58 01/20/2016 0909   CHOLHDL 1.9 01/20/2016 0909   VLDL 11 01/20/2016 0909   LDLCALC 43 01/20/2016 0909    Additional studies/ records that were reviewed today include:   2D Echo 03/15/16   Study Conclusions   - Left ventricle: The cavity size was normal. Wall thickness was   increased in a pattern of moderate LVH. Systolic function was   normal. The estimated ejection fraction was in the range of 60%   to 65%. Wall motion was normal; there were no regional wall   motion abnormalities. Doppler parameters are consistent with   restrictive left ventricular relaxation (grade 3 diastolic   dysfunction). The E/A ratio is >2. The E/e&' ratio  is >20,   suggesting markedly elevated LV filling pressure. - Aortic valve: Trileaflet. Sclerosis without stenosis. There was   no regurgitation. - Mitral valve: Mildly thickened leaflets with bileaflet late   systolic prolapse. There was moderate regurgitation. - Left atrium: Severely dilated. - Right ventricle: The cavity size was mildly dilated. Systolic   function was normal. - Right atrium: Severely dilated. - Tricuspid valve: There was moderate regurgitation. - Pulmonary arteries: Dilated. PA peak pressure: 77 mm Hg (S). - Inferior vena cava: The vessel was dilated. The respirophasic   diameter changes were blunted (< 50%), consistent with elevated   central venous pressure.   Impressions:   - Compared to a prior study in 2016, there is now Grade 3 DD with   high LV filling pressures as well as severe pulmonary (likely   venous) hypertension wtih RVSP of 77 mmHg, moderate MR and TR and   severe biatrial enlargement.      ASSESSMENT:    1. Pulmonary hypertension   2. Chronic diastolic CHF (congestive heart failure) ([place])   3. Coronary artery disease involving native coronary artery of native heart without angina pectoris   4. PAD (peripheral artery disease) ([place])   5. CKD (chronic kidney disease), stage III      PLAN:  In order of problems listed above:  Pulmonary hypertension group to secondary to pulmonary venous hypertension from diastolic dysfunction.[place] in 2016 showed mild pulmonary venous hypertension, PVR was only 1.8 WU. Recent echo 02/2016 showed severe pulmonary HTN. RVSP of 77 mmHg. uresis was recommended. He has lost 4 pounds and is breathing easier since he took Lasix 40 mg twice a day for 3 days. We will check his lab work today for follow-up. Follow-up with Dr. Radford Pax in 2 months.  Hypertension blood pressure is better than it was last week but still up a little. He has not taken all his medications yet today. He does keep track of it  at home and it's much  lower than it is here. No change in treatment.  Chronic diastolic heart failure compensated continue Lasix 40 mg once daily  CAD nonobstructive without angina  PAD renal artery stenosis, mesenteric artery stenosis and distal aortic stenosis followed by Dr. Fletcher Anon.  CKD baseline creatinine around 2.6 followed by Dr. Justin Mend. We'll check labs today.     Medication Adjustments/Labs and Tests Ordered: Current medicines are reviewed at length with the patient today.  Concerns regarding medicines are outlined above.  Medication changes, Labs and Tests ordered today are listed in the Patient Instructions below. Patient Instructions  Medication Instructions:  Your physician recommends that you continue on your current medications as directed. Please refer to the Current Medication list given to you today.   Labwork: TODAY BMET, BNP  Testing/Procedures: NONE  Follow-Up: DR. Radford Pax 06/22/16 @ 10:45   Any Other Special Instructions Will Be Listed Below (If Applicable).     If you need a refill on your cardiac medications before your next appointment, please call your pharmacy.      Sumner Boast, PA-C  04/20/2016 11:37 AM    [facility] [address], [place], [place]  [address] Phone: [phone]; Fax: [phone]

## 2016-04-21 LAB — BASIC METABOLIC PANEL
BUN / CREAT RATIO: 20 (ref 10–24)
BUN: 57 mg/dL — ABNORMAL HIGH (ref 8–27)
CALCIUM: 9.7 mg/dL (ref 8.6–10.2)
CO2: 23 mmol/L (ref 18–29)
Chloride: 102 mmol/L (ref 96–106)
Creatinine, Ser: 2.86 mg/dL — ABNORMAL HIGH (ref 0.76–1.27)
GFR calc non Af Amer: 20 mL/min/{1.73_m2} — ABNORMAL LOW (ref >59)
GFR, EST AFRICAN AMERICAN: 23 mL/min/{1.73_m2} — AB (ref >59)
Glucose: 116 mg/dL — ABNORMAL HIGH (ref 65–99)
POTASSIUM: 4.3 mmol/L (ref 3.5–5.2)
Sodium: 140 mmol/L (ref 134–144)

## 2016-04-21 LAB — PRO B NATRIURETIC PEPTIDE: NT-Pro BNP: 1703 pg/mL — ABNORMAL HIGH (ref 0–486)

## 2016-04-23 ENCOUNTER — Encounter: Payer: Self-pay | Admitting: *Deleted

## 2016-04-23 ENCOUNTER — Telehealth: Payer: Self-pay | Admitting: Cardiology

## 2016-04-23 DIAGNOSIS — R7989 Other specified abnormal findings of blood chemistry: Secondary | ICD-10-CM

## 2016-04-23 MED ORDER — FUROSEMIDE 40 MG PO TABS: 60.0000 mg | ORAL_TABLET | Freq: Every day | ORAL | 3 refills | Status: DC

## 2016-04-23 NOTE — Telephone Encounter (Signed)
-----   Message from Imogene Burn, PA-C sent at 04/21/2016  8:52 AM EST ----- Heart failure marker still elevated and kidney function up a bit. Try to increase Lasix to 60 mg daily. Recheck bmet and bnp next week

## 2016-04-23 NOTE — Telephone Encounter (Signed)
Follow Up:; ° ° °Returning your call. °

## 2016-04-23 NOTE — Telephone Encounter (Signed)
Pt aware of his lab results. He will increase his Lasix to 40 mg taking 1 1/2 tablet daily He will come by the office 03/29/16 for repeat bmet and bnp. Pt verbalized understanding.

## 2016-04-26 DIAGNOSIS — H401132 Primary open-angle glaucoma, bilateral, moderate stage: Secondary | ICD-10-CM | POA: Diagnosis not present

## 2016-04-26 DIAGNOSIS — H52203 Unspecified astigmatism, bilateral: Secondary | ICD-10-CM | POA: Diagnosis not present

## 2016-04-26 DIAGNOSIS — Z961 Presence of intraocular lens: Secondary | ICD-10-CM | POA: Diagnosis not present

## 2016-04-26 DIAGNOSIS — H524 Presbyopia: Secondary | ICD-10-CM | POA: Diagnosis not present

## 2016-04-28 ENCOUNTER — Other Ambulatory Visit: Payer: Medicare Other | Admitting: *Deleted

## 2016-04-28 DIAGNOSIS — R7989 Other specified abnormal findings of blood chemistry: Secondary | ICD-10-CM

## 2016-04-28 DIAGNOSIS — N184 Chronic kidney disease, stage 4 (severe): Secondary | ICD-10-CM | POA: Diagnosis not present

## 2016-04-28 DIAGNOSIS — I272 Pulmonary hypertension, unspecified: Secondary | ICD-10-CM | POA: Diagnosis not present

## 2016-04-28 DIAGNOSIS — I5032 Chronic diastolic (congestive) heart failure: Secondary | ICD-10-CM | POA: Diagnosis not present

## 2016-04-28 LAB — BASIC METABOLIC PANEL
BUN/Creatinine Ratio: 16 (ref 10–24)
BUN: 60 mg/dL — AB (ref 8–27)
CO2: 21 mmol/L (ref 18–29)
Calcium: 9.6 mg/dL (ref 8.6–10.2)
Chloride: 98 mmol/L (ref 96–106)
Creatinine, Ser: 3.65 mg/dL — ABNORMAL HIGH (ref 0.76–1.27)
GFR calc Af Amer: 17 mL/min/{1.73_m2} — ABNORMAL LOW (ref >59)
GFR, EST NON AFRICAN AMERICAN: 15 mL/min/{1.73_m2} — AB (ref >59)
GLUCOSE: 117 mg/dL — AB (ref 65–99)
Potassium: 3.7 mmol/L (ref 3.5–5.2)
Sodium: 139 mmol/L (ref 134–144)

## 2016-04-28 LAB — PRO B NATRIURETIC PEPTIDE: NT-Pro BNP: 1296 pg/mL — ABNORMAL HIGH (ref 0–486)

## 2016-04-28 NOTE — Addendum Note (Signed)
Addended by: Eulis Foster on: 04/28/2016 10:47 AM   Modules accepted: Orders

## 2016-04-29 ENCOUNTER — Other Ambulatory Visit: Payer: Self-pay | Admitting: Nephrology

## 2016-04-29 ENCOUNTER — Telehealth: Payer: Self-pay | Admitting: *Deleted

## 2016-04-29 DIAGNOSIS — N183 Chronic kidney disease, stage 3 unspecified: Secondary | ICD-10-CM

## 2016-04-29 DIAGNOSIS — R7989 Other specified abnormal findings of blood chemistry: Secondary | ICD-10-CM

## 2016-04-29 NOTE — Telephone Encounter (Signed)
-----   Message from Imogene Burn, PA-C sent at 04/29/2016  7:54 AM EST ----- Kidney function worse after taking extra lasix. Recheck bmet in 1 week. Sent copy to Dr. Justin Mend

## 2016-05-04 ENCOUNTER — Other Ambulatory Visit: Payer: Medicare Other | Admitting: *Deleted

## 2016-05-04 DIAGNOSIS — R7989 Other specified abnormal findings of blood chemistry: Secondary | ICD-10-CM

## 2016-05-04 LAB — BASIC METABOLIC PANEL
BUN/Creatinine Ratio: 16 (ref 10–24)
BUN: 54 mg/dL — AB (ref 8–27)
CHLORIDE: 100 mmol/L (ref 96–106)
CO2: 23 mmol/L (ref 18–29)
Calcium: 10.1 mg/dL (ref 8.6–10.2)
Creatinine, Ser: 3.36 mg/dL — ABNORMAL HIGH (ref 0.76–1.27)
GFR calc Af Amer: 19 mL/min/{1.73_m2} — ABNORMAL LOW (ref >59)
GFR calc non Af Amer: 16 mL/min/{1.73_m2} — ABNORMAL LOW (ref >59)
Glucose: 151 mg/dL — ABNORMAL HIGH (ref 65–99)
POTASSIUM: 4.2 mmol/L (ref 3.5–5.2)
Sodium: 139 mmol/L (ref 134–144)

## 2016-05-05 ENCOUNTER — Ambulatory Visit
Admission: RE | Admit: 2016-05-05 | Discharge: 2016-05-05 | Disposition: A | Discharge status: Home / Self Care | Payer: Medicare Other | Source: Ambulatory Visit | Attending: Nephrology | Admitting: Nephrology

## 2016-05-05 DIAGNOSIS — N189 Chronic kidney disease, unspecified: Secondary | ICD-10-CM | POA: Diagnosis not present

## 2016-05-05 DIAGNOSIS — N183 Chronic kidney disease, stage 3 unspecified: Secondary | ICD-10-CM

## 2016-05-06 DIAGNOSIS — E785 Hyperlipidemia, unspecified: Secondary | ICD-10-CM | POA: Diagnosis not present

## 2016-05-06 DIAGNOSIS — D649 Anemia, unspecified: Secondary | ICD-10-CM | POA: Diagnosis not present

## 2016-05-06 DIAGNOSIS — I2510 Atherosclerotic heart disease of native coronary artery without angina pectoris: Secondary | ICD-10-CM | POA: Diagnosis not present

## 2016-05-06 DIAGNOSIS — Z0000 Encounter for general adult medical examination without abnormal findings: Secondary | ICD-10-CM | POA: Diagnosis not present

## 2016-05-06 DIAGNOSIS — I272 Pulmonary hypertension, unspecified: Secondary | ICD-10-CM | POA: Diagnosis not present

## 2016-05-06 DIAGNOSIS — E119 Type 2 diabetes mellitus without complications: Secondary | ICD-10-CM | POA: Diagnosis not present

## 2016-05-06 DIAGNOSIS — J449 Chronic obstructive pulmonary disease, unspecified: Secondary | ICD-10-CM | POA: Diagnosis not present

## 2016-05-06 DIAGNOSIS — Z86010 Personal history of colonic polyps: Secondary | ICD-10-CM | POA: Diagnosis not present

## 2016-05-06 DIAGNOSIS — N183 Chronic kidney disease, stage 3 (moderate): Secondary | ICD-10-CM | POA: Diagnosis not present

## 2016-05-06 DIAGNOSIS — I10 Essential (primary) hypertension: Secondary | ICD-10-CM | POA: Diagnosis not present

## 2016-05-06 DIAGNOSIS — R634 Abnormal weight loss: Secondary | ICD-10-CM | POA: Diagnosis not present

## 2016-05-06 DIAGNOSIS — R911 Solitary pulmonary nodule: Secondary | ICD-10-CM | POA: Diagnosis not present

## 2016-05-07 DIAGNOSIS — Z79899 Other long term (current) drug therapy: Secondary | ICD-10-CM | POA: Diagnosis not present

## 2016-05-07 DIAGNOSIS — Z87891 Personal history of nicotine dependence: Secondary | ICD-10-CM | POA: Diagnosis not present

## 2016-05-07 DIAGNOSIS — H401113 Primary open-angle glaucoma, right eye, severe stage: Secondary | ICD-10-CM | POA: Diagnosis not present

## 2016-05-07 DIAGNOSIS — E0500 Thyrotoxicosis with diffuse goiter without thyrotoxic crisis or storm: Secondary | ICD-10-CM | POA: Insufficient documentation

## 2016-05-07 DIAGNOSIS — I509 Heart failure, unspecified: Secondary | ICD-10-CM | POA: Diagnosis not present

## 2016-05-07 DIAGNOSIS — I2510 Atherosclerotic heart disease of native coronary artery without angina pectoris: Secondary | ICD-10-CM | POA: Diagnosis not present

## 2016-05-07 DIAGNOSIS — H348312 Tributary (branch) retinal vein occlusion, right eye, stable: Secondary | ICD-10-CM | POA: Diagnosis not present

## 2016-05-07 DIAGNOSIS — Z888 Allergy status to other drugs, medicaments and biological substances status: Secondary | ICD-10-CM | POA: Diagnosis not present

## 2016-05-07 DIAGNOSIS — H401121 Primary open-angle glaucoma, left eye, mild stage: Secondary | ICD-10-CM | POA: Insufficient documentation

## 2016-05-07 DIAGNOSIS — I129 Hypertensive chronic kidney disease with stage 1 through stage 4 chronic kidney disease, or unspecified chronic kidney disease: Secondary | ICD-10-CM | POA: Diagnosis not present

## 2016-05-07 DIAGNOSIS — E785 Hyperlipidemia, unspecified: Secondary | ICD-10-CM | POA: Diagnosis not present

## 2016-05-07 DIAGNOSIS — Z961 Presence of intraocular lens: Secondary | ICD-10-CM | POA: Diagnosis not present

## 2016-05-10 ENCOUNTER — Other Ambulatory Visit: Payer: Self-pay | Admitting: Family Medicine

## 2016-05-10 DIAGNOSIS — R911 Solitary pulmonary nodule: Secondary | ICD-10-CM

## 2016-05-12 ENCOUNTER — Ambulatory Visit
Admission: RE | Admit: 2016-05-12 | Discharge: 2016-05-12 | Disposition: A | Discharge status: Home / Self Care | Payer: Medicare Other | Source: Ambulatory Visit | Attending: Family Medicine | Admitting: Family Medicine

## 2016-05-12 DIAGNOSIS — R918 Other nonspecific abnormal finding of lung field: Secondary | ICD-10-CM | POA: Diagnosis not present

## 2016-05-12 DIAGNOSIS — R911 Solitary pulmonary nodule: Secondary | ICD-10-CM

## 2016-05-17 DIAGNOSIS — H401113 Primary open-angle glaucoma, right eye, severe stage: Secondary | ICD-10-CM | POA: Diagnosis not present

## 2016-05-17 DIAGNOSIS — E0500 Thyrotoxicosis with diffuse goiter without thyrotoxic crisis or storm: Secondary | ICD-10-CM | POA: Diagnosis not present

## 2016-05-17 DIAGNOSIS — Z961 Presence of intraocular lens: Secondary | ICD-10-CM | POA: Diagnosis not present

## 2016-05-17 DIAGNOSIS — H401121 Primary open-angle glaucoma, left eye, mild stage: Secondary | ICD-10-CM | POA: Diagnosis not present

## 2016-05-17 DIAGNOSIS — H4720 Unspecified optic atrophy: Secondary | ICD-10-CM | POA: Diagnosis not present

## 2016-06-22 ENCOUNTER — Ambulatory Visit: Payer: Medicare Other | Admitting: Cardiology

## 2016-07-01 ENCOUNTER — Ambulatory Visit (INDEPENDENT_AMBULATORY_CARE_PROVIDER_SITE_OTHER): Payer: Medicare Other | Admitting: Cardiology

## 2016-07-01 ENCOUNTER — Encounter (INDEPENDENT_AMBULATORY_CARE_PROVIDER_SITE_OTHER): Payer: Self-pay

## 2016-07-01 ENCOUNTER — Encounter: Payer: Self-pay | Admitting: Cardiology

## 2016-07-01 VITALS — BP 146/58 | HR 46 | Ht 69.0 in | Wt 139.4 lb

## 2016-07-01 DIAGNOSIS — I6523 Occlusion and stenosis of bilateral carotid arteries: Secondary | ICD-10-CM

## 2016-07-01 DIAGNOSIS — I130 Hypertensive heart and chronic kidney disease with heart failure and stage 1 through stage 4 chronic kidney disease, or unspecified chronic kidney disease: Secondary | ICD-10-CM | POA: Diagnosis not present

## 2016-07-01 DIAGNOSIS — I272 Pulmonary hypertension, unspecified: Secondary | ICD-10-CM | POA: Diagnosis not present

## 2016-07-01 DIAGNOSIS — I341 Nonrheumatic mitral (valve) prolapse: Secondary | ICD-10-CM

## 2016-07-01 DIAGNOSIS — I2510 Atherosclerotic heart disease of native coronary artery without angina pectoris: Secondary | ICD-10-CM

## 2016-07-01 DIAGNOSIS — I5032 Chronic diastolic (congestive) heart failure: Secondary | ICD-10-CM

## 2016-07-01 DIAGNOSIS — I77810 Thoracic aortic ectasia: Secondary | ICD-10-CM

## 2016-07-01 DIAGNOSIS — E7800 Pure hypercholesterolemia, unspecified: Secondary | ICD-10-CM

## 2016-07-01 NOTE — Progress Notes (Signed)
Cardiology Office Note    Date:  07/01/2016   ID:  [name], DOB 1932/05/17, MRN [id number]  PCP:  Gerrit Heck, MD  Cardiologist:  Fransico Him, MD   Chief Complaint  Patient presents with  . Coronary Artery Disease  . Hypertension  . Mitral Regurgitation    History of Present Illness:  Jeffrey Campbell is a 83 y.o. male with a history of ASCAD (Jeffrey Campbell 2012 with 50-60% ostial LAD, 50-60%mLAD, 60% pRCA), HTN, dyslipidemia, mild MR with posterior MVP, bradycardia and moderate pulmonary HTN (followed by Dr. Aundra Dubin) presents today for followup. He has had an extensive w/u for pulmonary HTN (Group 2 from pulmonary venous HTN in setting of elevated left heart pressure) with PFTs showing mild COPD but moderate to severely reduced DLCO, neg VQ scan for PE and chest CT with no interstitial lung disease. He does have OSA but is intolerant to CPAP. He also has bilateral renal artery stenosis, mesenteric artery stenosis and distal aortic stenosis followed by Dr. Fletcher Anon. Jeffrey Campbell 01/2015 showed mild pulmonary venous HTN with prominent V waves in the PCWP and RA. 2D echo showed mild MR with MVP so prominent V waves felt to be due to stiff ventricles/diastolic dysfunction in setting of poorly controlled HTN. He has chronic diastolic CHF.   He is here today for followup and is doing well. He weighs himself daily and his weight is running 135-136lbs which has been very stable.  He denies anychest pain or pressure, SOB, DOE, LE edema, PND, orthopnea, palpitations, dizziness or syncope.    Past Medical History:  Diagnosis Date  . BPH (benign prostatic hyperplasia)   . CAD (coronary artery disease)   . Carotid artery stenosis    40-59% bilateral  . CKD (chronic kidney disease), stage III   . Diastolic dysfunction   . Dilated aortic root (Jeffrey Campbell)    23mm by echo 02/2015  . Glaucoma   . Graves disease   . Heart murmur, systolic   . History of ETOH abuse   . History of PFTs 05/2010   moderate airflow obstruction w reduced DLCO by PFT   . Hyperlipemia   . Hypertension   . Hyperthyroidism 08/26/10   radioactive iodine therapy   . Mitral regurgitation echo 2015   mild  . Multiple thyroid nodules   . MVP (mitral valve prolapse) 11/2012   posterior MVP  . Organic impotence   . OSA (obstructive sleep apnea)    upper airway resistance syndrome with RDI 18/hr - not on CPAP due to insurance not covering  . PUD (peptic ulcer disease)   . Pulmonary hypertension echo 2015   moderate PASP 56mmHg  . Shutdown renal    after cardiac cath  . Type 2 diabetes, diet controlled ([place])   . Upper airway resistance syndrome     Past Surgical History:  Procedure Laterality Date  . APPENDECTOMY    . CARDIAC CATHETERIZATION    . CARDIAC CATHETERIZATION N/A 02/17/2015   Procedure: Right Heart Cath;  Surgeon: Larey Dresser, MD;  Location: [facility];  Service: Cardiovascular;  Laterality: N/A;  . heart catherization    . HERNIA REPAIR  10/2009    Current Medications: Current Meds  Medication Sig  . amLODipine-atorvastatin (CADUET) 10-40 MG tablet TAKE 1 TABLET BY MOUTH DAILY.  Marland Kitchen aspirin 81 MG tablet Take 81 mg by mouth daily.  . Brinzolamide-Brimonidine (SIMBRINZA) 1-0.2 % SUSP Apply 1 drop to eye 2 (two) times daily.  Marland Kitchen doxazosin (  CARDURA) 2 MG tablet Take 3 tablets (6 mg total) by mouth daily. *Please call and schedule an appointment with Dr Fletcher Anon*  . furosemide (LASIX) 40 MG tablet Take 1.5 tablets (60 mg total) by mouth daily.  . hydrALAZINE (APRESOLINE) 100 MG tablet TAKE 1 TABLET (100 MG TOTAL) BY MOUTH 3 (THREE) TIMES DAILY.  . hydrALAZINE (APRESOLINE) 100 MG tablet TAKE 1 TABLET (100 MG TOTAL) BY MOUTH 3 (THREE) TIMES DAILY.  . Multiple Vitamin (MULTIVITAMIN) tablet Take 1 tablet by mouth daily.    . nitroGLYCERIN (NITROSTAT) 0.4 MG SL tablet Place 0.4 mg under the tongue every 5 (five) minutes as needed for chest pain.   . potassium chloride (K-DUR,KLOR-CON) 10 MEQ  tablet Take 1 tablet (10 mEq total) by mouth daily.  . [DISCONTINUED] levothyroxine (SYNTHROID, LEVOTHROID) 112 MCG tablet Take 112 mcg by mouth daily before breakfast.    Allergies:   Tiotropium bromide monohydrate; Budesonide-formoterol fumarate; Fenofibrate micronized; Other; Tiotropium; Tricor [fenofibrate]; and Zocor [simvastatin]   Social History   Social History  . Marital status: Married    Spouse name: N/A  . Number of children: 4  . Years of education: N/A   Occupational History  . Retired Retired    [employer]   Social History Main Topics  . Smoking status: Former Smoker    Packs/day: 2.00    Years: 40.00    Types: Cigarettes    Quit date: 03/29/1984  . Smokeless tobacco: Never Used  . Alcohol use No     Comment: quit in 1981  . Drug use: No  . Sexual activity: Not Asked   Other Topics Concern  . None   Social History Narrative  . None     Family History:  The patient's family history includes Colon cancer in his brother, maternal uncle, and mother; Hypertension in his father; Kidney failure in his father; Lung disease in his brother.   ROS:   Please see the history of present illness.    ROS All other systems reviewed and are negative.  No flowsheet data found.     PHYSICAL EXAM:   VS:  BP (!) 146/58   Pulse (!) 46   Ht 5\' 9"  (1.753 m)   Wt 139 lb 6.4 oz (63.2 kg)   SpO2 98%   BMI 20.59 kg/m    GEN: Well nourished, well developed, in no acute distress  HEENT: normal  Neck: no JVD or masses.   Bilateral carotid bruits are presemt Cardiac: RRR; no rubs, or gallops,no edema.  Intact distal pulses bilaterally. 2/6 SM at RUSB to apex Respiratory:  clear to auscultation bilaterally, normal work of breathing GI: soft, nontender, nondistended, + BS MS: no deformity or atrophy  Skin: warm and dry, no rash Neuro:  Alert and Oriented x 3, Strength and sensation are intact Psych: euthymic mood, full affect  Wt Readings from Last 3 Encounters:    07/01/16 139 lb 6.4 oz (63.2 kg)  04/20/16 136 lb (61.7 kg)  04/02/16 140 lb 3.2 oz (63.6 kg)      Studies/Labs Reviewed:   EKG:  EKG is not ordered today.    Recent Labs: 01/20/2016: ALT 13 04/28/2016: NT-Pro BNP 1,296 05/04/2016: BUN 54; Creatinine, Ser 3.36; Potassium 4.2; Sodium 139   Lipid Panel    Component Value Date/Time   CHOL 112 (L) 01/20/2016 0909   TRIG 53 01/20/2016 0909   HDL 58 01/20/2016 0909   CHOLHDL 1.9 01/20/2016 0909   VLDL 11 01/20/2016 0909  [place] 43 01/20/2016 0909    Additional studies/ records that were reviewed today include:  none    ASSESSMENT:    1. Coronary artery disease involving native coronary artery of native heart without angina pectoris   2. Hypertensive heart and chronic kidney disease with heart failure and stage 1 through stage 4 chronic kidney disease, or chronic kidney disease ([place])   3. MVP (mitral valve prolapse)   4. Bilateral carotid artery stenosis   5. Pulmonary hypertension   6. Chronic diastolic CHF (congestive heart failure) ([place])   7. Dilated aortic root ([place])   8. Pure hypercholesterolemia      PLAN:  In order of problems listed above:  1. ASCAD - [place] 2012 with 50-60% ostial LAD, 50-60%mLAD, 60% pRCA.  He has not had any anginal chest pain.  He will continue on ASA and statin.    2. HTN - BP is borderline controlled on current meds.  He says at home it runs 140/60's.  CKD stage 4 - followed by Dr. Justin Mend.  Last creatinine was 3.36.   3. MVP with moderate MR by echo 02/2016.  I will repeat echo in 6 months to make sure this is stable.  4. Bilateral carotid artery stenosis - 40-59% bilateral stenosis by dopplers 06/2015.  Repeat dopplers this month.  Continue ASA/statin.  5. Moderate pulmonary HTN by RHC (group 2 with pulmonary venous HTN from diastolic CHF) - Cath showed mild and repeat echo 02/2016 showed moderate to severe so his lasix was adjusted.  He has not had any SOB or LE edema. I will repeat echo  08/2016 to reassess his PAP  6. Chronic diastolic CHF - he appears euvolemic on exam today.  His weight is stable.  He will continue on Lasix.   7. Dilated aortic root - normal by echo 02/2016.  8. Hyperlipidemia with LDL goal < 70. He will continue on statin.  His last LDL was 43 last fall.  I will repeat an FLP and ALT.    Medication Adjustments/Labs and Tests Ordered: Current medicines are reviewed at length with the patient today.  Concerns regarding medicines are outlined above.  Medication changes, Labs and Tests ordered today are listed in the Patient Instructions below.  There are no Patient Instructions on file for this visit.   Signed, Fransico Him, MD  07/01/2016 9:53 AM    [facility] [address], [place], [place]  [address] Phone: [phone]; Fax: [phone]

## 2016-07-01 NOTE — Patient Instructions (Signed)
Medication Instructions:  Your physician recommends that you continue on your current medications as directed. Please refer to the Current Medication list given to you today.   Labwork: Your physician recommends that you return for FASTING lab work.  Testing/Procedures: Your physician has requested that you have a carotid duplex next available. This test is an ultrasound of the carotid arteries in your neck. It looks at blood flow through these arteries that supply the brain with blood. Allow one hour for this exam. There are no restrictions or special instructions.   Your physician has requested that you have an echocardiogram in June, 2018. Echocardiography is a painless test that uses sound waves to create images of your heart. It provides your doctor with information about the size and shape of your heart and how well your heart's chambers and valves are working. This procedure takes approximately one hour. There are no restrictions for this procedure.  Follow-Up: Your physician wants you to follow-up in: 6 months with Dr. Radford Pax. You will receive a reminder letter in the mail two months in advance. If you don't receive a letter, please call our office to schedule the follow-up appointment.   Any Other Special Instructions Will Be Listed Below (If Applicable).     If you need a refill on your cardiac medications before your next appointment, please call your pharmacy.

## 2016-07-02 ENCOUNTER — Ambulatory Visit (HOSPITAL_COMMUNITY)
Admission: RE | Admit: 2016-07-02 | Discharge: 2016-07-02 | Disposition: A | Discharge status: Home / Self Care | Payer: Medicare Other | Source: Ambulatory Visit | Attending: Cardiology | Admitting: Cardiology

## 2016-07-02 DIAGNOSIS — I6523 Occlusion and stenosis of bilateral carotid arteries: Secondary | ICD-10-CM

## 2016-07-04 ENCOUNTER — Encounter: Payer: Self-pay | Admitting: Cardiology

## 2016-07-04 ENCOUNTER — Other Ambulatory Visit: Payer: Self-pay | Admitting: Cardiovascular Disease

## 2016-07-05 NOTE — Telephone Encounter (Signed)
Refill Request.  

## 2016-07-06 ENCOUNTER — Other Ambulatory Visit: Payer: Medicare Other | Admitting: *Deleted

## 2016-07-06 DIAGNOSIS — E7800 Pure hypercholesterolemia, unspecified: Secondary | ICD-10-CM

## 2016-07-06 LAB — LIPID PANEL
Chol/HDL Ratio: 2.1 ratio (ref 0.0–5.0)
Cholesterol, Total: 117 mg/dL (ref 100–199)
HDL: 56 mg/dL (ref >39)
LDL Calculated: 53 mg/dL (ref 0–99)
Triglycerides: 42 mg/dL (ref 0–149)
VLDL CHOLESTEROL CAL: 8 mg/dL (ref 5–40)

## 2016-07-06 LAB — HEPATIC FUNCTION PANEL
ALT: 14 IU/L (ref 0–44)
AST: 17 IU/L (ref 0–40)
Albumin: 4.2 g/dL (ref 3.5–4.7)
Alkaline Phosphatase: 70 IU/L (ref 39–117)
BILIRUBIN, DIRECT: 0.3 mg/dL (ref 0.00–0.40)
Bilirubin Total: 0.9 mg/dL (ref 0.0–1.2)
Total Protein: 6.8 g/dL (ref 6.0–8.5)

## 2016-07-15 ENCOUNTER — Other Ambulatory Visit: Payer: Self-pay

## 2016-07-15 ENCOUNTER — Ambulatory Visit (HOSPITAL_COMMUNITY): Payer: Medicare Other | Attending: Interventional Cardiology

## 2016-07-15 DIAGNOSIS — I272 Pulmonary hypertension, unspecified: Secondary | ICD-10-CM | POA: Diagnosis not present

## 2016-07-15 DIAGNOSIS — I083 Combined rheumatic disorders of mitral, aortic and tricuspid valves: Secondary | ICD-10-CM | POA: Diagnosis not present

## 2016-07-16 DIAGNOSIS — R634 Abnormal weight loss: Secondary | ICD-10-CM | POA: Diagnosis not present

## 2016-07-16 DIAGNOSIS — Z800 Family history of malignant neoplasm of digestive organs: Secondary | ICD-10-CM | POA: Diagnosis not present

## 2016-07-16 DIAGNOSIS — D649 Anemia, unspecified: Secondary | ICD-10-CM | POA: Diagnosis not present

## 2016-07-21 ENCOUNTER — Ambulatory Visit (INDEPENDENT_AMBULATORY_CARE_PROVIDER_SITE_OTHER): Payer: Medicare Other

## 2016-07-21 ENCOUNTER — Ambulatory Visit (INDEPENDENT_AMBULATORY_CARE_PROVIDER_SITE_OTHER): Payer: Medicare Other | Admitting: Cardiology

## 2016-07-21 ENCOUNTER — Encounter: Payer: Self-pay | Admitting: Cardiology

## 2016-07-21 VITALS — BP 152/68 | HR 93 | Ht 69.0 in | Wt 138.8 lb

## 2016-07-21 DIAGNOSIS — R001 Bradycardia, unspecified: Secondary | ICD-10-CM

## 2016-07-21 DIAGNOSIS — I6523 Occlusion and stenosis of bilateral carotid arteries: Secondary | ICD-10-CM

## 2016-07-21 DIAGNOSIS — I272 Pulmonary hypertension, unspecified: Secondary | ICD-10-CM

## 2016-07-21 LAB — CBC
Hematocrit: 30.2 % — ABNORMAL LOW (ref 37.5–51.0)
Hemoglobin: 10.4 g/dL — ABNORMAL LOW (ref 13.0–17.7)
MCH: 31.1 pg (ref 26.6–33.0)
MCHC: 34.4 g/dL (ref 31.5–35.7)
MCV: 90 fL (ref 79–97)
PLATELETS: 178 10*3/uL (ref 150–379)
RBC: 3.34 x10E6/uL — AB (ref 4.14–5.80)
RDW: 14.7 % (ref 12.3–15.4)
WBC: 6 10*3/uL (ref 3.4–10.8)

## 2016-07-21 LAB — BASIC METABOLIC PANEL
BUN/Creatinine Ratio: 19 (ref 10–24)
BUN: 57 mg/dL — ABNORMAL HIGH (ref 8–27)
CO2: 21 mmol/L (ref 18–29)
Calcium: 9.7 mg/dL (ref 8.6–10.2)
Chloride: 103 mmol/L (ref 96–106)
Creatinine, Ser: 2.95 mg/dL — ABNORMAL HIGH (ref 0.76–1.27)
GFR calc Af Amer: 22 mL/min/{1.73_m2} — ABNORMAL LOW (ref >59)
GFR calc non Af Amer: 19 mL/min/{1.73_m2} — ABNORMAL LOW (ref >59)
Glucose: 116 mg/dL — ABNORMAL HIGH (ref 65–99)
Potassium: 4.1 mmol/L (ref 3.5–5.2)
Sodium: 142 mmol/L (ref 134–144)

## 2016-07-21 LAB — PROTIME-INR
INR: 1.1 (ref 0.8–1.2)
Prothrombin Time: 11.2 s (ref 9.1–12.0)

## 2016-07-21 NOTE — Progress Notes (Signed)
07/21/2016 Jeffrey Jeffrey   01-Dec-1932  [id number]  Primary Physician Jeffrey Heck, MD Primary Cardiologist: Dr. Radford Campbell    Reason for Visit/CC: Discuss indication for right heart catheterization for Pulmonary HTN  HPI:  Jeffrey Jeffrey is a 83 y.o. male with a history of ASCAD ([place] 2012 with 50-60% ostial LAD, 50-60%mLAD, 60% pRCA), HTN, dyslipidemia, mild MR with posterior MVP, bradycardia and moderate pulmonary HTN (followed by Dr. Aundra Campbell) presents today for followup. He has had an extensive w/u for pulmonary HTN (Group 67from pulmonary venous HTN in setting of elevated left heart pressure) with PFTs showing mild COPD but moderate to severely reduced DLCO, neg VQ scan for PE and chest CT with no interstitial lung disease. He does have OSA but is intolerant to CPAP. He also has bilateral renal artery stenosis, mesenteric artery stenosis and distal aortic stenosis followed by Dr. Fletcher Campbell. Jeffrey Jeffrey 01/2015 showed mild pulmonary venous HTN with prominent V waves in the PCWP and RA. 2D echo showed mild MR with MVP so prominent V waves felt to be due to stiff ventricles/diastolic dysfunction in setting of poorly controlled HTN. He has chronic diastolic CHF.   He is followed by Dr. Radford Campbell and was recently seen by her on 07/01/2016 for follow-up. He was doing fairly well that time, denying chest pain and dyspnea. His blood pressure was well-controlled. Dr. Radford Campbell elected to repeat an echocardiogram to reassess his pulmonary artery pressure, for monitoring of his known pulmonary hypertension. 2-D echocardiogram was performed 07/15/2016. This showed normal left ventricular function with moderate LVH, mild MR, mild left atrial enlargement and severe pulmonary hypertension. Pulmonary artery pressure was increased, at 88 mmHg. These findings were reviewed by Dr. Radford Campbell and she recommended that the patient undergo repeat right heart catheterization with Dr. Aundra Campbell for further assessment.  He is doing  well today. No complaints.  EKG was obtained given slow pulse rate. This demonstrated junctional bradycardia with occasional PVCs. Ventricular rate 42 bpm. He denies chest pain, dyspnea, dizziness, syncope/near-syncope. His medications have been reviewed. He is not on any AV nodal blocking agents. He does have a history of hypothyroidism and is on Synthroid for replacement.  Current Meds  Medication Sig  . amLODipine-atorvastatin (CADUET) 10-40 MG tablet TAKE 1 TABLET BY MOUTH DAILY.  Marland Kitchen aspirin 81 MG tablet Take 81 mg by mouth daily.  . Brinzolamide-Brimonidine (SIMBRINZA) 1-0.2 % SUSP Apply 1 drop to eye 2 (two) times daily.  Marland Kitchen doxazosin (CARDURA) 2 MG tablet TAKE 3 TABLETS BY MOUTH EVERY DAY  . furosemide (LASIX) 40 MG tablet Take 1.5 tablets (60 mg total) by mouth daily.  . hydrALAZINE (APRESOLINE) 100 MG tablet TAKE 1 TABLET (100 MG TOTAL) BY MOUTH 3 (THREE) TIMES DAILY.  Marland Kitchen levothyroxine (SYNTHROID, LEVOTHROID) 137 MCG tablet Take 137 mcg by mouth daily before breakfast.   . Multiple Vitamin (MULTIVITAMIN) tablet Take 1 tablet by mouth daily.    . nitroGLYCERIN (NITROSTAT) 0.4 MG SL tablet Place 0.4 mg under the tongue every 5 (five) minutes as needed for chest pain.   . potassium chloride (K-DUR,KLOR-CON) 10 MEQ tablet Take 1 tablet (10 mEq total) by mouth daily.  . [DISCONTINUED] hydrALAZINE (APRESOLINE) 100 MG tablet TAKE 1 TABLET (100 MG TOTAL) BY MOUTH 3 (THREE) TIMES DAILY.   Allergies  Allergen Reactions  . Tiotropium Bromide Monohydrate Other (See Comments)    Dry mouth  . Budesonide-Formoterol Fumarate     Pain in muscles, cramps in hands and legs  . Fenofibrate Micronized Other (See  Comments)    Not sure  . Other     DYE- Causes kidney to shut down  . Tiotropium Other (See Comments)    Weight loss  . Tricor [Fenofibrate]     Unknown   . Zocor [Simvastatin] Other (See Comments)    Not sure Liver problems    Past Medical History:  Diagnosis Date  . BPH (benign  prostatic hyperplasia)   . CAD (coronary artery disease)   . Carotid artery stenosis    1-39% bilateral carotid stenosis by dopplers 2018  . CKD (chronic kidney disease), stage III   . Diastolic dysfunction   . Dilated aortic root ([place])    38mm by echo 02/2015  . Glaucoma   . Graves disease   . Heart murmur, systolic   . History of ETOH abuse   . History of PFTs 05/2010   moderate airflow obstruction w reduced DLCO by PFT   . Hyperlipemia   . Hypertension   . Hyperthyroidism 08/26/10   radioactive iodine therapy   . Mitral regurgitation echo 2015   mild  . Multiple thyroid nodules   . MVP (mitral valve prolapse) 11/2012   posterior MVP  . Organic impotence   . OSA (obstructive sleep apnea)    upper airway resistance syndrome with RDI 18/hr - not on CPAP due to insurance not covering  . PUD (peptic ulcer disease)   . Pulmonary hypertension ([place]) echo 2015   moderate PASP 18mmHg  . Shutdown renal    after cardiac cath  . Type 2 diabetes, diet controlled (Jeffrey Jeffrey)   . Upper airway resistance syndrome    Family History  Problem Relation Age of Onset  . Kidney failure Father   . Hypertension Father   . Colon cancer Mother   . Colon cancer Brother   . Lung disease Brother   . Colon cancer Maternal Uncle    Past Surgical History:  Procedure Laterality Date  . APPENDECTOMY    . CARDIAC CATHETERIZATION    . CARDIAC CATHETERIZATION N/A 02/17/2015   Procedure: Right Heart Cath;  Surgeon: Jeffrey Dresser, MD;  Location: [facility];  Service: Cardiovascular;  Laterality: N/A;  . heart catherization    . HERNIA REPAIR  10/2009   Social History   Social History  . Marital status: Married    Spouse name: N/A  . Number of children: 4  . Years of education: N/A   Occupational History  . Retired Retired    [employer]   Social History Main Topics  . Smoking status: Former Smoker    Packs/day: 2.00    Years: 40.00    Types: Cigarettes    Quit date: 03/29/1984  .  Smokeless tobacco: Never Used  . Alcohol use No     Comment: quit in 1981  . Drug use: No  . Sexual activity: Not on file   Other Topics Concern  . Not on file   Social History Narrative  . No narrative on file     Review of Systems: General: negative for chills, fever, night sweats or weight changes.  Cardiovascular: negative for chest pain, dyspnea on exertion, edema, orthopnea, palpitations, paroxysmal nocturnal dyspnea or shortness of breath Dermatological: negative for rash Respiratory: negative for cough or wheezing Urologic: negative for hematuria Abdominal: negative for nausea, vomiting, diarrhea, bright red blood per rectum, melena, or hematemesis Neurologic: negative for visual changes, syncope, or dizziness All other systems reviewed and are otherwise negative except as noted  above.   Physical Exam:  Pulse 93, height 5\' 9"  (1.753 m), weight 138 lb 12.8 oz (63 kg), SpO2 96 %.  General appearance: alert, cooperative and no distress Neck: no carotid bruit and no JVD Lungs: clear to auscultation bilaterally Heart: regular rate and rhythm, S1, S2 normal, no murmur, click, rub or gallop Extremities: extremities normal, atraumatic, no cyanosis or edema Pulses: 2+ and symmetric Skin: Skin color, texture, turgor normal. No rashes or lesions Neurologic: Grossly normal  EKG  junctional bradycardia with occasional PVCs. Ventricular rate 42 bpm -- personally reviewed   ASSESSMENT AND PLAN:   1. Pulmonary HTN: He has had an extensive w/u for pulmonary HTN (Group 19from pulmonary venous HTN in setting of elevated left heart pressure) with PFTs showing mild COPD but moderate to severely reduced DLCO, neg VQ scan for PE and chest CT with no interstitial lung disease. He does have OSA but is intolerant to CPAP. Sent 2-D echocardiogram April 19 suggests disease progression to severe range. Pulmonary artery pressure has increase since prior study, now at 88 mmHg. and recommended that  he undergo right heart catheterization with Dr. Aundra Campbell for better assessment. We discussed indication for procedure as well as reviewed procedural details and potential risk. The patient agrees to undergo right heart catheterization. We will arrange for [facility] to be done at [facility] with Dr. Aundra Campbell.  2. Junctional bradycardia: noted on EKG today with occasional PVCs. Ventricular rates 42 bpm. He is completely asymptomatic, denying chest pain, dizziness, dyspnea, fatigue, syncope/near-syncope. He does not appear to be on any AV nodal blocking agents but he does have a history of hypothyroidism, on Synthroid for hormone replacement therapy. Given the degree of his bradycardia, will have him wear a 48-hour Holter monitor.   3. HTN: mildly elevated however he has not yet taken his Lasix today. He is waiting to take this after his office visit to avoid need for increased urination. He plans to take this when he returns home. He has taken his other meds.   4. CAD: denies CP.    Follow-Up with Dr. Radford Campbell after [place]. Refer to EP if notable Holter monitor findings.   Jeffrey Jeffrey, MHS [facility] 07/21/2016 9:37 AM

## 2016-07-21 NOTE — Patient Instructions (Signed)
Medication Instructions:  Your physician recommends that you continue on your current medications as directed. Please refer to the Current Medication list given to you today.   Labwork: LABS TODAY: CBC, BMET, PT/INR  Testing/Procedures: Your physician has recommended that you wear a holter monitor. Holter monitors are medical devices that record the heart's electrical activity. Doctors most often use these monitors to diagnose arrhythmias. Arrhythmias are problems with the speed or rhythm of the heartbeat. The monitor is a small, portable device. You can wear one while you do your normal daily activities. This is usually used to diagnose what is causing palpitations/syncope (passing out).  Your physician has requested that you have a cardiac catheterization. Cardiac catheterization is used to diagnose and/or treat various heart conditions. Doctors may recommend this procedure for a number of different reasons. The most common reason is to evaluate chest pain. Chest pain can be a symptom of coronary artery disease (CAD), and cardiac catheterization can show whether plaque is narrowing or blocking your heart's arteries. This procedure is also used to evaluate the valves, as well as measure the blood flow and oxygen levels in different parts of your heart. For further information please visit [facility]. Please follow instruction sheet, as given.    Follow-Up: After Heart Catheterization  Any Other Special Instructions Will Be Listed Below (If Applicable).    [facility] [address] [place] [address] Dept: [phone] Loc: [phone]  Jeffrey Campbell  07/21/2016  You are scheduled for a Cardiac Catheterization on Wednesday, May 2 with Dr. Loralie Champagne.  1. Please arrive at the [facility] (Main Entrance A) at [facility]: [address] [place], [place] [address] at  6:30 AM (two hours before your procedure to ensure your preparation). Free valet parking service is available.   Special note: Every effort is made to have your procedure done on time. Please understand that emergencies sometimes delay scheduled procedures.  2. Diet: Do not eat or drink anything after midnight prior to your procedure except sips of water to take medications.  3. Labs: LABS Drawn Today in office  4. Medication instructions in preparation for your procedure:  DO NOT Take your furosemide lasix the morning of your procedure.  On the morning of your procedure, take your Aspirin and any morning medicines NOT listed above.  You may use sips of water.  5. Plan for one night stay--bring personal belongings. 6. Bring a current list of your medications and current insurance cards. 7. You MUST have a responsible person to drive you home. 8. Someone MUST be with you the first 24 hours after you arrive home or your discharge will be delayed. 9. Please wear clothes that are easy to get on and off and wear slip-on shoes.  Thank you for allowing [place] to care for you!   -- [facility]    If you need a refill on your cardiac medications before your next appointment, please call your pharmacy.

## 2016-07-28 ENCOUNTER — Encounter (HOSPITAL_COMMUNITY)
Admission: RE | Disposition: A | Discharge status: Home / Self Care | Payer: Self-pay | Source: Ambulatory Visit | Attending: Cardiology

## 2016-07-28 ENCOUNTER — Ambulatory Visit (HOSPITAL_COMMUNITY)
Admission: RE | Admit: 2016-07-28 | Discharge: 2016-07-28 | Disposition: A | Discharge status: Home / Self Care | Payer: Medicare Other | Source: Ambulatory Visit | Attending: Cardiology | Admitting: Cardiology

## 2016-07-28 ENCOUNTER — Encounter (HOSPITAL_COMMUNITY): Payer: Self-pay | Admitting: Cardiology

## 2016-07-28 DIAGNOSIS — I701 Atherosclerosis of renal artery: Secondary | ICD-10-CM | POA: Insufficient documentation

## 2016-07-28 DIAGNOSIS — E1122 Type 2 diabetes mellitus with diabetic chronic kidney disease: Secondary | ICD-10-CM | POA: Diagnosis not present

## 2016-07-28 DIAGNOSIS — G4733 Obstructive sleep apnea (adult) (pediatric): Secondary | ICD-10-CM | POA: Insufficient documentation

## 2016-07-28 DIAGNOSIS — I129 Hypertensive chronic kidney disease with stage 1 through stage 4 chronic kidney disease, or unspecified chronic kidney disease: Secondary | ICD-10-CM | POA: Insufficient documentation

## 2016-07-28 DIAGNOSIS — I2510 Atherosclerotic heart disease of native coronary artery without angina pectoris: Secondary | ICD-10-CM | POA: Insufficient documentation

## 2016-07-28 DIAGNOSIS — I272 Pulmonary hypertension, unspecified: Secondary | ICD-10-CM | POA: Diagnosis not present

## 2016-07-28 DIAGNOSIS — N183 Chronic kidney disease, stage 3 (moderate): Secondary | ICD-10-CM | POA: Insufficient documentation

## 2016-07-28 DIAGNOSIS — I493 Ventricular premature depolarization: Secondary | ICD-10-CM | POA: Diagnosis not present

## 2016-07-28 DIAGNOSIS — E785 Hyperlipidemia, unspecified: Secondary | ICD-10-CM | POA: Diagnosis not present

## 2016-07-28 DIAGNOSIS — Z7982 Long term (current) use of aspirin: Secondary | ICD-10-CM | POA: Insufficient documentation

## 2016-07-28 DIAGNOSIS — I6523 Occlusion and stenosis of bilateral carotid arteries: Secondary | ICD-10-CM | POA: Diagnosis not present

## 2016-07-28 DIAGNOSIS — H409 Unspecified glaucoma: Secondary | ICD-10-CM | POA: Insufficient documentation

## 2016-07-28 DIAGNOSIS — I700 Atherosclerosis of aorta: Secondary | ICD-10-CM | POA: Diagnosis not present

## 2016-07-28 DIAGNOSIS — E039 Hypothyroidism, unspecified: Secondary | ICD-10-CM | POA: Diagnosis not present

## 2016-07-28 DIAGNOSIS — Z87891 Personal history of nicotine dependence: Secondary | ICD-10-CM | POA: Insufficient documentation

## 2016-07-28 DIAGNOSIS — N400 Enlarged prostate without lower urinary tract symptoms: Secondary | ICD-10-CM | POA: Diagnosis not present

## 2016-07-28 DIAGNOSIS — I341 Nonrheumatic mitral (valve) prolapse: Secondary | ICD-10-CM | POA: Insufficient documentation

## 2016-07-28 HISTORY — PX: RIGHT HEART CATH: CATH118263

## 2016-07-28 LAB — POCT I-STAT 3, VENOUS BLOOD GAS (G3P V)
ACID-BASE DEFICIT: 5 mmol/L — AB (ref 0.0–2.0)
ACID-BASE DEFICIT: 5 mmol/L — AB (ref 0.0–2.0)
Bicarbonate: 19.8 mmol/L — ABNORMAL LOW (ref 20.0–28.0)
Bicarbonate: 19.8 mmol/L — ABNORMAL LOW (ref 20.0–28.0)
O2 SAT: 73 %
O2 SAT: 73 %
PO2 VEN: 40 mmHg (ref 32.0–45.0)
TCO2: 21 mmol/L (ref 0–100)
TCO2: 21 mmol/L (ref 0–100)
pCO2, Ven: 34.1 mmHg — ABNORMAL LOW (ref 44.0–60.0)
pCO2, Ven: 34.1 mmHg — ABNORMAL LOW (ref 44.0–60.0)
pH, Ven: 7.372 (ref 7.250–7.430)
pH, Ven: 7.372 (ref 7.250–7.430)
pO2, Ven: 39 mmHg (ref 32.0–45.0)

## 2016-07-28 LAB — GLUCOSE, CAPILLARY: GLUCOSE-CAPILLARY: 91 mg/dL (ref 65–99)

## 2016-07-28 SURGERY — RIGHT HEART CATH
Anesthesia: LOCAL

## 2016-07-28 MED ORDER — SODIUM CHLORIDE 0.9% FLUSH: 3.0000 mL | INTRAVENOUS | Status: DC | PRN

## 2016-07-28 MED ORDER — SODIUM CHLORIDE 0.9 % IV SOLN
INTRAVENOUS | Status: DC
  Administered 2016-07-28: 08:00:00 via INTRAVENOUS

## 2016-07-28 MED ORDER — SODIUM CHLORIDE 0.9% FLUSH: 3.0000 mL | Freq: Two times a day (BID) | INTRAVENOUS | Status: DC

## 2016-07-28 MED ORDER — HYDRALAZINE HCL 20 MG/ML IJ SOLN
INTRAMUSCULAR | Status: AC
  Filled 2016-07-28: qty 1

## 2016-07-28 MED ORDER — LIDOCAINE HCL (PF) 1 % IJ SOLN
INTRAMUSCULAR | Status: DC | PRN
  Administered 2016-07-28: 2 mL

## 2016-07-28 MED ORDER — SODIUM CHLORIDE 0.9 % IV SOLN: 250.0000 mL | INTRAVENOUS | Status: DC | PRN

## 2016-07-28 MED ORDER — ACETAMINOPHEN 325 MG PO TABS: 650.0000 mg | ORAL_TABLET | ORAL | Status: DC | PRN

## 2016-07-28 MED ORDER — HEPARIN (PORCINE) IN NACL 2-0.9 UNIT/ML-% IJ SOLN
INTRAMUSCULAR | Status: DC | PRN
  Administered 2016-07-28: 1000 mL

## 2016-07-28 MED ORDER — ASPIRIN 81 MG PO CHEW: 81.0000 mg | CHEWABLE_TABLET | ORAL | Status: DC

## 2016-07-28 MED ORDER — HEPARIN (PORCINE) IN NACL 2-0.9 UNIT/ML-% IJ SOLN
INTRAMUSCULAR | Status: AC
  Filled 2016-07-28: qty 1000

## 2016-07-28 MED ORDER — ONDANSETRON HCL 4 MG/2ML IJ SOLN: 4.0000 mg | Freq: Four times a day (QID) | INTRAMUSCULAR | Status: DC | PRN

## 2016-07-28 MED ORDER — LIDOCAINE HCL 1 % IJ SOLN
INTRAMUSCULAR | Status: AC
  Filled 2016-07-28: qty 20

## 2016-07-28 MED ORDER — HYDRALAZINE HCL 20 MG/ML IJ SOLN
INTRAMUSCULAR | Status: DC | PRN
  Administered 2016-07-28 (×4): 10 mg via INTRAVENOUS

## 2016-07-28 MED ORDER — MUPIROCIN 2 % EX OINT
TOPICAL_OINTMENT | CUTANEOUS | Status: AC
  Filled 2016-07-28: qty 22

## 2016-07-28 SURGICAL SUPPLY — 6 items
CATH BALLN WEDGE 5F 110CM (CATHETERS) ×1 IMPLANT
PACK CARDIAC CATHETERIZATION (CUSTOM PROCEDURE TRAY) ×2 IMPLANT
PROTECTION STATION PRESSURIZED (MISCELLANEOUS) ×2
SHEATH GLIDE SLENDER 4/5FR (SHEATH) ×1 IMPLANT
STATION PROTECTION PRESSURIZED (MISCELLANEOUS) IMPLANT
TRANSDUCER W/STOPCOCK (MISCELLANEOUS) ×2 IMPLANT

## 2016-07-28 NOTE — Discharge Instructions (Signed)

## 2016-07-28 NOTE — H&P (View-Only) (Signed)
07/21/2016 [name]   11/09/1932  [id number]  Primary Physician Gerrit Heck, MD Primary Cardiologist: Dr. Radford Pax    Reason for Visit/CC: Discuss indication for right heart catheterization for Pulmonary HTN  HPI:  Jeffrey Campbell is a 83 y.o. male with a history of ASCAD ([place] 2012 with 50-60% ostial LAD, 50-60%mLAD, 60% pRCA), HTN, dyslipidemia, mild MR with posterior MVP, bradycardia and moderate pulmonary HTN (followed by Dr. Aundra Dubin) presents today for followup. He has had an extensive w/u for pulmonary HTN (Group 83from pulmonary venous HTN in setting of elevated left heart pressure) with PFTs showing mild COPD but moderate to severely reduced DLCO, neg VQ scan for PE and chest CT with no interstitial lung disease. He does have OSA but is intolerant to CPAP. He also has bilateral renal artery stenosis, mesenteric artery stenosis and distal aortic stenosis followed by Dr. Fletcher Anon. [place] 01/2015 showed mild pulmonary venous HTN with prominent V waves in the PCWP and RA. 2D echo showed mild MR with MVP so prominent V waves felt to be due to stiff ventricles/diastolic dysfunction in setting of poorly controlled HTN. He has chronic diastolic CHF.   He is followed by Dr. Radford Pax and was recently seen by her on 07/01/2016 for follow-up. He was doing fairly well that time, denying chest pain and dyspnea. His blood pressure was well-controlled. Dr. Radford Pax elected to repeat an echocardiogram to reassess his pulmonary artery pressure, for monitoring of his known pulmonary hypertension. 2-D echocardiogram was performed 07/15/2016. This showed normal left ventricular function with moderate LVH, mild MR, mild left atrial enlargement and severe pulmonary hypertension. Pulmonary artery pressure was increased, at 88 mmHg. These findings were reviewed by Dr. Radford Pax and she recommended that the patient undergo repeat right heart catheterization with Dr. Aundra Dubin for further assessment.  He is doing  well today. No complaints.  EKG was obtained given slow pulse rate. This demonstrated junctional bradycardia with occasional PVCs. Ventricular rate 42 bpm. He denies chest pain, dyspnea, dizziness, syncope/near-syncope. His medications have been reviewed. He is not on any AV nodal blocking agents. He does have a history of hypothyroidism and is on Synthroid for replacement.  Current Meds  Medication Sig  . amLODipine-atorvastatin (CADUET) 10-40 MG tablet TAKE 1 TABLET BY MOUTH DAILY.  Marland Kitchen aspirin 81 MG tablet Take 81 mg by mouth daily.  . Brinzolamide-Brimonidine (SIMBRINZA) 1-0.2 % SUSP Apply 1 drop to eye 2 (two) times daily.  Marland Kitchen doxazosin (CARDURA) 2 MG tablet TAKE 3 TABLETS BY MOUTH EVERY DAY  . furosemide (LASIX) 40 MG tablet Take 1.5 tablets (60 mg total) by mouth daily.  . hydrALAZINE (APRESOLINE) 100 MG tablet TAKE 1 TABLET (100 MG TOTAL) BY MOUTH 3 (THREE) TIMES DAILY.  Marland Kitchen levothyroxine (SYNTHROID, LEVOTHROID) 137 MCG tablet Take 137 mcg by mouth daily before breakfast.   . Multiple Vitamin (MULTIVITAMIN) tablet Take 1 tablet by mouth daily.    . nitroGLYCERIN (NITROSTAT) 0.4 MG SL tablet Place 0.4 mg under the tongue every 5 (five) minutes as needed for chest pain.   . potassium chloride (K-DUR,KLOR-CON) 10 MEQ tablet Take 1 tablet (10 mEq total) by mouth daily.  . [DISCONTINUED] hydrALAZINE (APRESOLINE) 100 MG tablet TAKE 1 TABLET (100 MG TOTAL) BY MOUTH 3 (THREE) TIMES DAILY.   Allergies  Allergen Reactions  . Tiotropium Bromide Monohydrate Other (See Comments)    Dry mouth  . Budesonide-Formoterol Fumarate     Pain in muscles, cramps in hands and legs  . Fenofibrate Micronized Other (See  Comments)    Not sure  . Other     DYE- Causes kidney to shut down  . Tiotropium Other (See Comments)    Weight loss  . Tricor [Fenofibrate]     Unknown   . Zocor [Simvastatin] Other (See Comments)    Not sure Liver problems    Past Medical History:  Diagnosis Date  . BPH (benign  prostatic hyperplasia)   . CAD (coronary artery disease)   . Carotid artery stenosis    1-39% bilateral carotid stenosis by dopplers 2018  . CKD (chronic kidney disease), stage III   . Diastolic dysfunction   . Dilated aortic root ([place])    30mm by echo 02/2015  . Glaucoma   . Graves disease   . Heart murmur, systolic   . History of ETOH abuse   . History of PFTs 05/2010   moderate airflow obstruction w reduced DLCO by PFT   . Hyperlipemia   . Hypertension   . Hyperthyroidism 08/26/10   radioactive iodine therapy   . Mitral regurgitation echo 2015   mild  . Multiple thyroid nodules   . MVP (mitral valve prolapse) 11/2012   posterior MVP  . Organic impotence   . OSA (obstructive sleep apnea)    upper airway resistance syndrome with RDI 18/hr - not on CPAP due to insurance not covering  . PUD (peptic ulcer disease)   . Pulmonary hypertension ([place]) echo 2015   moderate PASP 28mmHg  . Shutdown renal    after cardiac cath  . Type 2 diabetes, diet controlled ([place])   . Upper airway resistance syndrome    Family History  Problem Relation Age of Onset  . Kidney failure Father   . Hypertension Father   . Colon cancer Mother   . Colon cancer Brother   . Lung disease Brother   . Colon cancer Maternal Uncle    Past Surgical History:  Procedure Laterality Date  . APPENDECTOMY    . CARDIAC CATHETERIZATION    . CARDIAC CATHETERIZATION N/A 02/17/2015   Procedure: Right Heart Cath;  Surgeon: Larey Dresser, MD;  Location: [facility];  Service: Cardiovascular;  Laterality: N/A;  . heart catherization    . HERNIA REPAIR  10/2009   Social History   Social History  . Marital status: Married    Spouse name: N/A  . Number of children: 4  . Years of education: N/A   Occupational History  . Retired Retired    [employer]   Social History Main Topics  . Smoking status: Former Smoker    Packs/day: 2.00    Years: 40.00    Types: Cigarettes    Quit date: 03/29/1984  .  Smokeless tobacco: Never Used  . Alcohol use No     Comment: quit in 1981  . Drug use: No  . Sexual activity: Not on file   Other Topics Concern  . Not on file   Social History Narrative  . No narrative on file     Review of Systems: General: negative for chills, fever, night sweats or weight changes.  Cardiovascular: negative for chest pain, dyspnea on exertion, edema, orthopnea, palpitations, paroxysmal nocturnal dyspnea or shortness of breath Dermatological: negative for rash Respiratory: negative for cough or wheezing Urologic: negative for hematuria Abdominal: negative for nausea, vomiting, diarrhea, bright red blood per rectum, melena, or hematemesis Neurologic: negative for visual changes, syncope, or dizziness All other systems reviewed and are otherwise negative except as noted  above.   Physical Exam:  Pulse 93, height 5\' 9"  (1.753 m), weight 138 lb 12.8 oz (63 kg), SpO2 96 %.  General appearance: alert, cooperative and no distress Neck: no carotid bruit and no JVD Lungs: clear to auscultation bilaterally Heart: regular rate and rhythm, S1, S2 normal, no murmur, click, rub or gallop Extremities: extremities normal, atraumatic, no cyanosis or edema Pulses: 2+ and symmetric Skin: Skin color, texture, turgor normal. No rashes or lesions Neurologic: Grossly normal  EKG  junctional bradycardia with occasional PVCs. Ventricular rate 42 bpm -- personally reviewed   ASSESSMENT AND PLAN:   1. Pulmonary HTN: He has had an extensive w/u for pulmonary HTN (Group 50from pulmonary venous HTN in setting of elevated left heart pressure) with PFTs showing mild COPD but moderate to severely reduced DLCO, neg VQ scan for PE and chest CT with no interstitial lung disease. He does have OSA but is intolerant to CPAP. Sent 2-D echocardiogram April 19 suggests disease progression to severe range. Pulmonary artery pressure has increase since prior study, now at 88 mmHg. and recommended that  he undergo right heart catheterization with Dr. Aundra Dubin for better assessment. We discussed indication for procedure as well as reviewed procedural details and potential risk. The patient agrees to undergo right heart catheterization. We will arrange for this to be done at [facility] with Dr. Aundra Dubin.  2. Junctional bradycardia: noted on EKG today with occasional PVCs. Ventricular rates 42 bpm. He is completely asymptomatic, denying chest pain, dizziness, dyspnea, fatigue, syncope/near-syncope. He does not appear to be on any AV nodal blocking agents but he does have a history of hypothyroidism, on Synthroid for hormone replacement therapy. Given the degree of his bradycardia, will have him wear a 48-hour Holter monitor.   3. HTN: mildly elevated however he has not yet taken his Lasix today. He is waiting to take this after his office visit to avoid need for increased urination. He plans to take this when he returns home. He has taken his other meds.   4. CAD: denies CP.    Follow-Up with Dr. Radford Pax after [place]. Refer to EP if notable Holter monitor findings.   Brittainy Ladoris Gene, MHS [facility] 07/21/2016 9:37 AM

## 2016-07-28 NOTE — Interval H&P Note (Signed)
History and Physical Interval Note:  07/28/2016 8:30 AM  Jeffrey Campbell  has presented today for surgery, with the diagnosis of pulm htn  The various methods of treatment have been discussed with the patient and family. After consideration of risks, benefits and other options for treatment, the patient has consented to  Procedure(s): Right Heart Cath (N/A) as a surgical intervention .  The patient's history has been reviewed, patient examined, no change in status, stable for surgery.  I have reviewed the patient's chart and labs.  Questions were answered to the patient's satisfaction.     Jahan Friedlander [employer]

## 2016-07-30 DIAGNOSIS — E890 Postprocedural hypothyroidism: Secondary | ICD-10-CM | POA: Diagnosis not present

## 2016-07-30 DIAGNOSIS — E0500 Thyrotoxicosis with diffuse goiter without thyrotoxic crisis or storm: Secondary | ICD-10-CM | POA: Diagnosis not present

## 2016-07-30 DIAGNOSIS — R001 Bradycardia, unspecified: Secondary | ICD-10-CM | POA: Diagnosis not present

## 2016-08-02 DIAGNOSIS — H401113 Primary open-angle glaucoma, right eye, severe stage: Secondary | ICD-10-CM | POA: Diagnosis not present

## 2016-08-13 DIAGNOSIS — N2581 Secondary hyperparathyroidism of renal origin: Secondary | ICD-10-CM | POA: Diagnosis not present

## 2016-08-13 DIAGNOSIS — D631 Anemia in chronic kidney disease: Secondary | ICD-10-CM | POA: Diagnosis not present

## 2016-08-13 DIAGNOSIS — N183 Chronic kidney disease, stage 3 (moderate): Secondary | ICD-10-CM | POA: Diagnosis not present

## 2016-08-17 DIAGNOSIS — I2510 Atherosclerotic heart disease of native coronary artery without angina pectoris: Secondary | ICD-10-CM | POA: Diagnosis not present

## 2016-08-17 DIAGNOSIS — H4720 Unspecified optic atrophy: Secondary | ICD-10-CM | POA: Diagnosis not present

## 2016-08-17 DIAGNOSIS — Z7982 Long term (current) use of aspirin: Secondary | ICD-10-CM | POA: Diagnosis not present

## 2016-08-17 DIAGNOSIS — Z888 Allergy status to other drugs, medicaments and biological substances status: Secondary | ICD-10-CM | POA: Diagnosis not present

## 2016-08-17 DIAGNOSIS — H401113 Primary open-angle glaucoma, right eye, severe stage: Secondary | ICD-10-CM | POA: Diagnosis not present

## 2016-08-17 DIAGNOSIS — Z961 Presence of intraocular lens: Secondary | ICD-10-CM | POA: Diagnosis not present

## 2016-08-17 DIAGNOSIS — Z87891 Personal history of nicotine dependence: Secondary | ICD-10-CM | POA: Diagnosis not present

## 2016-08-17 DIAGNOSIS — Z79899 Other long term (current) drug therapy: Secondary | ICD-10-CM | POA: Diagnosis not present

## 2016-08-17 DIAGNOSIS — Z9842 Cataract extraction status, left eye: Secondary | ICD-10-CM | POA: Diagnosis not present

## 2016-08-17 DIAGNOSIS — I509 Heart failure, unspecified: Secondary | ICD-10-CM | POA: Diagnosis not present

## 2016-08-17 DIAGNOSIS — I110 Hypertensive heart disease with heart failure: Secondary | ICD-10-CM | POA: Diagnosis not present

## 2016-08-17 DIAGNOSIS — E0500 Thyrotoxicosis with diffuse goiter without thyrotoxic crisis or storm: Secondary | ICD-10-CM | POA: Diagnosis not present

## 2016-08-17 DIAGNOSIS — E785 Hyperlipidemia, unspecified: Secondary | ICD-10-CM | POA: Diagnosis not present

## 2016-08-17 DIAGNOSIS — Z9841 Cataract extraction status, right eye: Secondary | ICD-10-CM | POA: Diagnosis not present

## 2016-08-17 DIAGNOSIS — H0520 Unspecified exophthalmos: Secondary | ICD-10-CM | POA: Diagnosis not present

## 2016-08-24 ENCOUNTER — Other Ambulatory Visit: Payer: Self-pay | Admitting: *Deleted

## 2016-08-24 DIAGNOSIS — D649 Anemia, unspecified: Secondary | ICD-10-CM | POA: Diagnosis not present

## 2016-08-24 DIAGNOSIS — R634 Abnormal weight loss: Secondary | ICD-10-CM | POA: Diagnosis not present

## 2016-08-24 DIAGNOSIS — K5730 Diverticulosis of large intestine without perforation or abscess without bleeding: Secondary | ICD-10-CM | POA: Diagnosis not present

## 2016-08-24 DIAGNOSIS — K2980 Duodenitis without bleeding: Secondary | ICD-10-CM | POA: Diagnosis not present

## 2016-08-24 DIAGNOSIS — K635 Polyp of colon: Secondary | ICD-10-CM | POA: Diagnosis not present

## 2016-08-24 DIAGNOSIS — Z1211 Encounter for screening for malignant neoplasm of colon: Secondary | ICD-10-CM | POA: Diagnosis not present

## 2016-08-24 DIAGNOSIS — D125 Benign neoplasm of sigmoid colon: Secondary | ICD-10-CM | POA: Diagnosis not present

## 2016-08-24 DIAGNOSIS — Z800 Family history of malignant neoplasm of digestive organs: Secondary | ICD-10-CM | POA: Diagnosis not present

## 2016-08-24 MED ORDER — HYDRALAZINE HCL 100 MG PO TABS: ORAL_TABLET | ORAL | 3 refills | Status: DC

## 2016-08-30 DIAGNOSIS — N401 Enlarged prostate with lower urinary tract symptoms: Secondary | ICD-10-CM | POA: Diagnosis not present

## 2016-08-30 DIAGNOSIS — R3915 Urgency of urination: Secondary | ICD-10-CM | POA: Diagnosis not present

## 2016-09-04 ENCOUNTER — Emergency Department (HOSPITAL_COMMUNITY): Payer: Medicare Other

## 2016-09-04 ENCOUNTER — Encounter (HOSPITAL_COMMUNITY): Payer: Self-pay | Admitting: Emergency Medicine

## 2016-09-04 ENCOUNTER — Inpatient Hospital Stay (HOSPITAL_COMMUNITY)
Admission: EM | Admit: 2016-09-04 | Discharge: 2016-09-24 | DRG: 077 | Disposition: A | Discharge status: Skilled Nursing Facility | Payer: Medicare Other | Attending: Internal Medicine | Admitting: Internal Medicine

## 2016-09-04 DIAGNOSIS — I272 Pulmonary hypertension, unspecified: Secondary | ICD-10-CM | POA: Diagnosis present

## 2016-09-04 DIAGNOSIS — I674 Hypertensive encephalopathy: Secondary | ICD-10-CM | POA: Diagnosis not present

## 2016-09-04 DIAGNOSIS — N390 Urinary tract infection, site not specified: Secondary | ICD-10-CM | POA: Diagnosis present

## 2016-09-04 DIAGNOSIS — R4182 Altered mental status, unspecified: Secondary | ICD-10-CM | POA: Diagnosis not present

## 2016-09-04 DIAGNOSIS — E43 Unspecified severe protein-calorie malnutrition: Secondary | ICD-10-CM | POA: Diagnosis not present

## 2016-09-04 DIAGNOSIS — F01518 Vascular dementia, unspecified severity, with other behavioral disturbance: Secondary | ICD-10-CM | POA: Diagnosis present

## 2016-09-04 DIAGNOSIS — I6529 Occlusion and stenosis of unspecified carotid artery: Secondary | ICD-10-CM | POA: Diagnosis present

## 2016-09-04 DIAGNOSIS — Z79890 Hormone replacement therapy (postmenopausal): Secondary | ICD-10-CM

## 2016-09-04 DIAGNOSIS — I5032 Chronic diastolic (congestive) heart failure: Secondary | ICD-10-CM | POA: Diagnosis not present

## 2016-09-04 DIAGNOSIS — Z9119 Patient's noncompliance with other medical treatment and regimen: Secondary | ICD-10-CM

## 2016-09-04 DIAGNOSIS — Z91041 Radiographic dye allergy status: Secondary | ICD-10-CM

## 2016-09-04 DIAGNOSIS — I5189 Other ill-defined heart diseases: Secondary | ICD-10-CM | POA: Diagnosis present

## 2016-09-04 DIAGNOSIS — R001 Bradycardia, unspecified: Secondary | ICD-10-CM | POA: Diagnosis present

## 2016-09-04 DIAGNOSIS — E119 Type 2 diabetes mellitus without complications: Secondary | ICD-10-CM

## 2016-09-04 DIAGNOSIS — E8770 Fluid overload, unspecified: Secondary | ICD-10-CM

## 2016-09-04 DIAGNOSIS — Z888 Allergy status to other drugs, medicaments and biological substances status: Secondary | ICD-10-CM

## 2016-09-04 DIAGNOSIS — G9341 Metabolic encephalopathy: Secondary | ICD-10-CM | POA: Diagnosis present

## 2016-09-04 DIAGNOSIS — G4733 Obstructive sleep apnea (adult) (pediatric): Secondary | ICD-10-CM | POA: Diagnosis present

## 2016-09-04 DIAGNOSIS — R7989 Other specified abnormal findings of blood chemistry: Secondary | ICD-10-CM

## 2016-09-04 DIAGNOSIS — J449 Chronic obstructive pulmonary disease, unspecified: Secondary | ICD-10-CM | POA: Diagnosis present

## 2016-09-04 DIAGNOSIS — M351 Other overlap syndromes: Secondary | ICD-10-CM | POA: Diagnosis present

## 2016-09-04 DIAGNOSIS — I161 Hypertensive emergency: Secondary | ICD-10-CM | POA: Diagnosis present

## 2016-09-04 DIAGNOSIS — I2510 Atherosclerotic heart disease of native coronary artery without angina pectoris: Secondary | ICD-10-CM | POA: Diagnosis present

## 2016-09-04 DIAGNOSIS — N184 Chronic kidney disease, stage 4 (severe): Secondary | ICD-10-CM | POA: Diagnosis present

## 2016-09-04 DIAGNOSIS — J9690 Respiratory failure, unspecified, unspecified whether with hypoxia or hypercapnia: Secondary | ICD-10-CM | POA: Diagnosis present

## 2016-09-04 DIAGNOSIS — Z7982 Long term (current) use of aspirin: Secondary | ICD-10-CM

## 2016-09-04 DIAGNOSIS — N179 Acute kidney failure, unspecified: Secondary | ICD-10-CM | POA: Diagnosis not present

## 2016-09-04 DIAGNOSIS — N189 Chronic kidney disease, unspecified: Secondary | ICD-10-CM

## 2016-09-04 DIAGNOSIS — Z87891 Personal history of nicotine dependence: Secondary | ICD-10-CM

## 2016-09-04 DIAGNOSIS — J9600 Acute respiratory failure, unspecified whether with hypoxia or hypercapnia: Secondary | ICD-10-CM

## 2016-09-04 DIAGNOSIS — I130 Hypertensive heart and chronic kidney disease with heart failure and stage 1 through stage 4 chronic kidney disease, or unspecified chronic kidney disease: Secondary | ICD-10-CM | POA: Diagnosis not present

## 2016-09-04 DIAGNOSIS — E875 Hyperkalemia: Secondary | ICD-10-CM | POA: Diagnosis present

## 2016-09-04 DIAGNOSIS — Z79899 Other long term (current) drug therapy: Secondary | ICD-10-CM

## 2016-09-04 DIAGNOSIS — D631 Anemia in chronic kidney disease: Secondary | ICD-10-CM | POA: Diagnosis present

## 2016-09-04 DIAGNOSIS — R05 Cough: Secondary | ICD-10-CM | POA: Diagnosis not present

## 2016-09-04 DIAGNOSIS — IMO0002 Reserved for concepts with insufficient information to code with codable children: Secondary | ICD-10-CM

## 2016-09-04 DIAGNOSIS — I701 Atherosclerosis of renal artery: Secondary | ICD-10-CM | POA: Diagnosis present

## 2016-09-04 DIAGNOSIS — N183 Chronic kidney disease, stage 3 unspecified: Secondary | ICD-10-CM | POA: Diagnosis not present

## 2016-09-04 DIAGNOSIS — F0151 Vascular dementia with behavioral disturbance: Secondary | ICD-10-CM | POA: Diagnosis present

## 2016-09-04 DIAGNOSIS — E039 Hypothyroidism, unspecified: Secondary | ICD-10-CM | POA: Diagnosis present

## 2016-09-04 LAB — URINALYSIS, ROUTINE W REFLEX MICROSCOPIC
BILIRUBIN URINE: NEGATIVE
Bacteria, UA: NONE SEEN
Glucose, UA: NEGATIVE mg/dL
Ketones, ur: NEGATIVE mg/dL
Leukocytes, UA: NEGATIVE
NITRITE: NEGATIVE
PH: 5 (ref 5.0–8.0)
Protein, ur: 100 mg/dL — AB
SPECIFIC GRAVITY, URINE: 1.023 (ref 1.005–1.030)

## 2016-09-04 LAB — BASIC METABOLIC PANEL
Anion gap: 7 (ref 5–15)
BUN: 45 mg/dL — AB (ref 6–20)
CALCIUM: 9.8 mg/dL (ref 8.9–10.3)
CO2: 22 mmol/L (ref 22–32)
Chloride: 111 mmol/L (ref 101–111)
Creatinine, Ser: 2.83 mg/dL — ABNORMAL HIGH (ref 0.61–1.24)
GFR calc Af Amer: 23 mL/min — ABNORMAL LOW (ref >60)
GFR, EST NON AFRICAN AMERICAN: 19 mL/min — AB (ref >60)
GLUCOSE: 109 mg/dL — AB (ref 65–99)
Potassium: 4.4 mmol/L (ref 3.5–5.1)
Sodium: 140 mmol/L (ref 135–145)

## 2016-09-04 LAB — TSH: TSH: 0.602 u[IU]/mL (ref 0.350–4.500)

## 2016-09-04 LAB — CBC
HCT: 33 % — ABNORMAL LOW (ref 39.0–52.0)
Hemoglobin: 10.7 g/dL — ABNORMAL LOW (ref 13.0–17.0)
MCH: 30.2 pg (ref 26.0–34.0)
MCHC: 32.4 g/dL (ref 30.0–36.0)
MCV: 93.2 fL (ref 78.0–100.0)
Platelets: 171 10*3/uL (ref 150–400)
RBC: 3.54 MIL/uL — ABNORMAL LOW (ref 4.22–5.81)
RDW: 14.5 % (ref 11.5–15.5)
WBC: 7 10*3/uL (ref 4.0–10.5)

## 2016-09-04 LAB — I-STAT TROPONIN, ED: Troponin i, poc: 0.08 ng/mL (ref 0.00–0.08)

## 2016-09-04 LAB — CBG MONITORING, ED: Glucose-Capillary: 103 mg/dL — ABNORMAL HIGH (ref 65–99)

## 2016-09-04 MED ORDER — NICARDIPINE HCL IN NACL 20-0.86 MG/200ML-% IV SOLN
3.0000 mg/h | INTRAVENOUS | Status: DC
  Administered 2016-09-04: 5 mg/h via INTRAVENOUS
  Administered 2016-09-05 (×2): 3 mg/h via INTRAVENOUS
  Administered 2016-09-06: 4 mg/h via INTRAVENOUS
  Administered 2016-09-06: 2 mg/h via INTRAVENOUS
  Filled 2016-09-04 (×5): qty 200

## 2016-09-04 NOTE — ED Provider Notes (Signed)
[facility] DEPT Provider Note   CSN: [id number] Arrival date & time: 09/04/16  2017     History   Chief Complaint Chief Complaint  Patient presents with  . Altered Mental Status    HPI Jeffrey Campbell is a 83 y.o. male.  HPI  Patient is an 83 year old male with past medical history significant for CK 80, CAD, Graves' disease, pulmonary hypertension, who presents to the emergency department with family members (grandson and daughter) for one-week history of altered mental status. Progressively worsening confusion over the past week. Only oriented to person. Did not recognize his daughter in the room. Earlier today he was trying to talk to his keys for the telephone. Not sleeping at night. No obvious visual hallucinations. Denies recent fever or chills. Family members aren't certain if he has had any recent medication changes. They are also uncertain if he has been taking his home medications. Denies mentioning chest pain, shortness of breath, cough, congestion, dysuria, abdominal pain, nausea or vomiting.  Past Medical History:  Diagnosis Date  . BPH (benign prostatic hyperplasia)   . CAD (coronary artery disease)   . Carotid artery stenosis    1-39% bilateral carotid stenosis by dopplers 2018  . CKD (chronic kidney disease), stage III   . Diastolic dysfunction   . Dilated aortic root ([place])    70mm by echo 02/2015  . Glaucoma   . Graves disease   . Heart murmur, systolic   . History of ETOH abuse   . History of PFTs 05/2010   moderate airflow obstruction w reduced DLCO by PFT   . Hyperlipemia   . Hypertension   . Hyperthyroidism 08/26/10   radioactive iodine therapy   . Mitral regurgitation echo 2015   mild  . Multiple thyroid nodules   . MVP (mitral valve prolapse) 11/2012   posterior MVP  . Organic impotence   . OSA (obstructive sleep apnea)    upper airway resistance syndrome with RDI 18/hr - not on CPAP due to insurance not covering  . PUD (peptic ulcer disease)   .  Pulmonary hypertension ([place]) echo 2015   moderate PASP 85mmHg  . Shutdown renal    after cardiac cath  . Type 2 diabetes, diet controlled ([place])   . Upper airway resistance syndrome     Patient Active Problem List   Diagnosis Date Noted  . PAD (peripheral artery disease) ([place]) 04/20/2016  . Dilated aortic root ([place]) 01/13/2016  . Bilateral renal artery stenosis ([place]) 02/18/2015  . Pulmonary hypertension ([place]) 02/10/2015  . Chronic diastolic CHF (congestive heart failure) ([place]) 02/10/2015  . Renal bruit 01/07/2015  . Carotid artery stenosis   . Diastolic dysfunction   . Bradycardia 05/29/2013  . Mitral regurgitation 11/27/2012  . MVP (mitral valve prolapse) 11/27/2012  . Glaucoma 04/09/2011  . COPD (chronic obstructive pulmonary disease) ([place]) 09/21/2010  . OSA (obstructive sleep apnea)   . Upper airway resistance syndrome   . Hyperthyroidism   . Type 2 diabetes, diet controlled ([place])   . CKD (chronic kidney disease), stage III   . CAD (coronary artery disease)   . Hyperlipemia   . Hypertensive heart and chronic kidney disease with heart failure and stage 1 through stage 4 chronic kidney disease, or chronic kidney disease ([place])   . PUD (peptic ulcer disease)     Past Surgical History:  Procedure Laterality Date  . APPENDECTOMY    . CARDIAC CATHETERIZATION    . CARDIAC CATHETERIZATION N/A 02/17/2015   Procedure: Right  Heart Cath;  Surgeon: Larey Dresser, MD;  Location: [facility];  Service: Cardiovascular;  Laterality: N/A;  . heart catherization    . HERNIA REPAIR  10/2009  . RIGHT HEART CATH N/A 07/28/2016   Procedure: Right Heart Cath;  Surgeon: Larey Dresser, MD;  Location: [facility];  Service: Cardiovascular;  Laterality: N/A;       Home Medications    Prior to Admission medications   Medication Sig Start Date End Date Taking? Authorizing Provider  amLODipine-atorvastatin (CADUET) 10-40 MG tablet TAKE 1 TABLET BY MOUTH DAILY. Patient taking  differently: Take 1 tablet by mouth at bedtime.  02/10/16  Yes Sueanne Margarita, MD  aspirin EC 81 MG tablet Take 81 mg by mouth daily.   Yes [provider]  Brinzolamide-Brimonidine (SIMBRINZA) 1-0.2 % SUSP Place 1 drop into both eyes 2 (two) times daily.    Yes [provider]  doxazosin (CARDURA) 2 MG tablet TAKE 3 TABLETS BY MOUTH EVERY DAY Patient taking differently: Take 1 tablet (2 mg) by mouth 3 times daily 07/05/16  Yes Wellington Hampshire, MD  furosemide (LASIX) 40 MG tablet Take 1.5 tablets (60 mg total) by mouth daily. Patient taking differently: Take 40 mg by mouth daily.  04/23/16  Yes Imogene Burn, PA-C  hydrALAZINE (APRESOLINE) 100 MG tablet TAKE 1 TABLET (100 MG TOTAL) BY MOUTH 3 (THREE) TIMES DAILY. Patient taking differently: Take 100 mg by mouth 3 (three) times daily.  08/24/16  Yes Turner, Eber Hong, MD  levothyroxine (SYNTHROID, LEVOTHROID) 137 MCG tablet Take 137 mcg by mouth daily before breakfast.  06/28/16  Yes [provider]  Multiple Vitamin (MULTIVITAMIN WITH MINERALS) TABS tablet Take 1 tablet by mouth daily.   Yes [provider]  potassium chloride (K-DUR,KLOR-CON) 10 MEQ tablet Take 1 tablet (10 mEq total) by mouth daily. 09/19/15  Yes Sueanne Margarita, MD    Family History Family History  Problem Relation Age of Onset  . Kidney failure Father   . Hypertension Father   . Colon cancer Mother   . Colon cancer Brother   . Lung disease Brother   . Colon cancer Maternal Uncle     Social History Social History  Substance Use Topics  . Smoking status: Former Smoker    Packs/day: 2.00    Years: 40.00    Types: Cigarettes    Quit date: 03/29/1984  . Smokeless tobacco: Never Used  . Alcohol use No     Comment: quit in 1981     Allergies   Tiotropium bromide monohydrate; Budesonide-formoterol fumarate; Other; Tricor [fenofibrate]; and Zocor [simvastatin]   Review of Systems Review of Systems  Unable to perform ROS: Mental  status change     Physical Exam Updated Vital Signs BP (!) 180/62 (BP Location: Left Arm)   Pulse (!) 48   Temp 98.4 F (36.9 C) (Rectal)   Resp 19   Ht 5\' 10"  (1.778 m)   Wt 59 kg (130 lb)   SpO2 98%   BMI 18.65 kg/m   Physical Exam  Constitutional: He appears well-developed and well-nourished. No distress.  HENT:  Head: Atraumatic.  Mouth/Throat: Oropharynx is clear and moist.  Eyes: Conjunctivae and EOM are normal. Pupils are equal, round, and reactive to light.  Neck: Normal range of motion. Neck supple. No JVD present.  Cardiovascular: Normal rate, regular rhythm, normal heart sounds and intact distal pulses.   Pulmonary/Chest: Effort normal and breath sounds normal. No  respiratory distress. He has no wheezes.  Abdominal: Soft. He exhibits no distension. There is no tenderness. There is no guarding.  Musculoskeletal: Normal range of motion.  Neurological: He is alert. He has normal strength. No cranial nerve deficit or sensory deficit. GCS eye subscore is 4. GCS verbal subscore is 4. GCS motor subscore is 6.  Oriented only to person. Does not recognize his daughter grandson in the room.  Skin: Skin is warm.  Psychiatric: He has a normal mood and affect.     ED Treatments / Results  Labs (all labs ordered are listed, but only abnormal results are displayed) Labs Reviewed  BASIC METABOLIC PANEL - Abnormal; Notable for the following:       Result Value   Glucose, Bld 109 (*)    BUN 45 (*)    Creatinine, Ser 2.83 (*)    GFR calc non Af Amer 19 (*)    GFR calc Af Amer 23 (*)    All other components within normal limits  CBC - Abnormal; Notable for the following:    RBC 3.54 (*)    Hemoglobin 10.7 (*)    HCT 33.0 (*)    All other components within normal limits  URINALYSIS, ROUTINE W REFLEX MICROSCOPIC - Abnormal; Notable for the following:    Hgb urine dipstick SMALL (*)    Protein, ur 100 (*)    Squamous Epithelial / LPF 0-5 (*)    All other components within  normal limits  CBG MONITORING, ED - Abnormal; Notable for the following:    Glucose-Capillary 103 (*)    All other components within normal limits  TSH  T4, FREE  I-STAT TROPOININ, ED    EKG  EKG Interpretation  Date/Time:  Saturday September 04 2016 20:33:14 EDT Ventricular Rate:  48 PR Interval:  234 QRS Duration: 76 QT Interval:  414 QTC Calculation: 369 R Axis:   11 Text Interpretation:  Sinus bradycardia with 1st degree A-V block Left ventricular hypertrophy with repolarization abnormality Abnormal ECG Confirmed by RAY MD, [name] [phone]) on 09/04/2016 10:46:41 PM       Radiology Dg Chest 2 View  Result Date: 09/04/2016 CLINICAL DATA:  Unsteady while standing. Cough and altered mental status. EXAM: CHEST  2 VIEW COMPARISON:  01/28/2015 CXR, chest CT 05/12/2016 FINDINGS: Stable cardiomegaly with aortic atherosclerosis. Mild central vascular congestion. No pneumonic consolidation, effusion or pneumothorax. No dominant mass is seen. No acute nor suspicious osseous abnormality. IMPRESSION: Cardiomegaly with aortic atherosclerosis. Mild vascular congestion since prior exam is noted. Electronically Signed   By: Ashley Royalty M.D.   On: 09/04/2016 22:42   Ct Head Wo Contrast  Result Date: 09/04/2016 CLINICAL DATA:  Altered mental status EXAM: CT HEAD WITHOUT CONTRAST TECHNIQUE: Contiguous axial images were obtained from the base of the skull through the vertex without intravenous contrast. COMPARISON:  None. FINDINGS: BRAIN: There is sulcal and ventricular prominence consistent with superficial and central atrophy. No intraparenchymal hemorrhage, mass effect nor midline shift. Periventricular and subcortical white matter hypodensities consistent with chronic small vessel ischemic disease are identified. No acute large vascular territory infarcts. No abnormal extra-axial fluid collections. Basal cisterns are not effaced and midline. VASCULAR: Moderate calcific atherosclerosis of the carotid  siphons. SKULL: No skull fracture. No significant scalp soft tissue swelling. SINUSES/ORBITS: The mastoid air-cells are clear. The included paranasal sinuses are well-aerated.The included ocular globes and orbital contents are non-suspicious. OTHER: None. IMPRESSION: 1. Chronic moderate degree small vessel ischemia with cerebral atrophy. 2. No acute  intracranial abnormality. Electronically Signed   By: Ashley Royalty M.D.   On: 09/04/2016 22:12    Procedures Procedures (including critical care time)  Medications Ordered in ED Medications  nicardipine (CARDENE) 20mg  in 0.86% saline 233ml IV infusion (0.1 mg/ml) (0 mg/hr Intravenous Hold 09/05/16 0028)     Initial Impression / Assessment and Plan / ED Course  I have reviewed the triage vital signs and the nursing notes.  Pertinent labs & imaging results that were available during my care of the patient were reviewed by me and considered in my medical decision making (see chart for details).     Patient is an 83 year old male, past medical history of CAD, pulmonary hypertension, bradycardia, Graves' disease who presents to the emergency department with a one-week history of progressively worsening altered mental status. Normal mental status baseline according to his daughter at bedside. No specific complaints. On arrival GCS 14. Oriented only to person. ABCs intact. BP 270/110. HR 40s. Nonfocal neurologic exam, lungs clear to auscultation, benign abdominal exam.  Differential includes metabolic encephalopathy, intracranial hemorrhage, hypertensive encephalopathy, myxedema coma, ACS, infectious etiology.  EKG showed sinus bradycardia, first-degree AV block, LVH, unchanged when compared to prior. No signs of acute ischemia. Chest x-ray showed no signs of pneumonia. CT head showed no acute findings. UA showed no signs of infectious process. Thyroid studies within normal limits. Hemoglobin baseline. No significant electrolyte abnormalities. Creatinine at  baseline. BUN 45. Initial troponin negative.  Patient most likely with hypertensive encephalopathy. Patient was started on a Cardene infusion. Doubt PE, no hypoxia. Doubt dissection, no tearing chest pain, symmetric pulses. Patient admitted to ICU for further management.  Final Clinical Impressions(s) / ED Diagnoses   Final diagnoses:  Hypertensive encephalopathy    New Prescriptions New Prescriptions   No medications on file     Nathaniel Man, MD 09/05/16 [id number]    Pattricia Boss, MD 09/05/16 [phone]

## 2016-09-04 NOTE — ED Notes (Signed)
Delay in lab draw,  Pt currently in xray. 

## 2016-09-04 NOTE — ED Triage Notes (Signed)
Pt BIB by grandson who lives with patient. Per grandson pt has been confused today, wandering around home, doing task and asking questions repeatedly. Pt alert, denies pain, denies fever, oriented. Pt states he is urinating without difficulty and taking medication "as far as he can remember"

## 2016-09-04 NOTE — ED Notes (Signed)
Per pt's grandson, pt is A&O x 4.  Still driving.  Grandson states that pt has no hx of dementia and is generally,"Sharp."

## 2016-09-05 ENCOUNTER — Other Ambulatory Visit (HOSPITAL_COMMUNITY): Payer: Medicare Other

## 2016-09-05 DIAGNOSIS — I130 Hypertensive heart and chronic kidney disease with heart failure and stage 1 through stage 4 chronic kidney disease, or unspecified chronic kidney disease: Secondary | ICD-10-CM | POA: Diagnosis not present

## 2016-09-05 DIAGNOSIS — N170 Acute kidney failure with tubular necrosis: Secondary | ICD-10-CM | POA: Diagnosis not present

## 2016-09-05 DIAGNOSIS — G9340 Encephalopathy, unspecified: Secondary | ICD-10-CM | POA: Diagnosis not present

## 2016-09-05 DIAGNOSIS — E875 Hyperkalemia: Secondary | ICD-10-CM | POA: Diagnosis not present

## 2016-09-05 DIAGNOSIS — E43 Unspecified severe protein-calorie malnutrition: Secondary | ICD-10-CM | POA: Diagnosis not present

## 2016-09-05 DIAGNOSIS — Z91041 Radiographic dye allergy status: Secondary | ICD-10-CM | POA: Diagnosis not present

## 2016-09-05 DIAGNOSIS — R630 Anorexia: Secondary | ICD-10-CM | POA: Diagnosis not present

## 2016-09-05 DIAGNOSIS — Z888 Allergy status to other drugs, medicaments and biological substances status: Secondary | ICD-10-CM | POA: Diagnosis not present

## 2016-09-05 DIAGNOSIS — E039 Hypothyroidism, unspecified: Secondary | ICD-10-CM | POA: Diagnosis not present

## 2016-09-05 DIAGNOSIS — E876 Hypokalemia: Secondary | ICD-10-CM | POA: Diagnosis not present

## 2016-09-05 DIAGNOSIS — E785 Hyperlipidemia, unspecified: Secondary | ICD-10-CM | POA: Diagnosis not present

## 2016-09-05 DIAGNOSIS — I2510 Atherosclerotic heart disease of native coronary artery without angina pectoris: Secondary | ICD-10-CM | POA: Diagnosis present

## 2016-09-05 DIAGNOSIS — R0902 Hypoxemia: Secondary | ICD-10-CM | POA: Diagnosis not present

## 2016-09-05 DIAGNOSIS — N183 Chronic kidney disease, stage 3 (moderate): Secondary | ICD-10-CM | POA: Diagnosis not present

## 2016-09-05 DIAGNOSIS — N2889 Other specified disorders of kidney and ureter: Secondary | ICD-10-CM | POA: Diagnosis not present

## 2016-09-05 DIAGNOSIS — N179 Acute kidney failure, unspecified: Secondary | ICD-10-CM | POA: Diagnosis not present

## 2016-09-05 DIAGNOSIS — F0150 Vascular dementia without behavioral disturbance: Secondary | ICD-10-CM | POA: Diagnosis not present

## 2016-09-05 DIAGNOSIS — I6529 Occlusion and stenosis of unspecified carotid artery: Secondary | ICD-10-CM | POA: Diagnosis present

## 2016-09-05 DIAGNOSIS — D631 Anemia in chronic kidney disease: Secondary | ICD-10-CM | POA: Diagnosis present

## 2016-09-05 DIAGNOSIS — I161 Hypertensive emergency: Secondary | ICD-10-CM

## 2016-09-05 DIAGNOSIS — J9811 Atelectasis: Secondary | ICD-10-CM | POA: Diagnosis not present

## 2016-09-05 DIAGNOSIS — R2689 Other abnormalities of gait and mobility: Secondary | ICD-10-CM | POA: Diagnosis not present

## 2016-09-05 DIAGNOSIS — J90 Pleural effusion, not elsewhere classified: Secondary | ICD-10-CM | POA: Diagnosis not present

## 2016-09-05 DIAGNOSIS — Z79899 Other long term (current) drug therapy: Secondary | ICD-10-CM | POA: Diagnosis not present

## 2016-09-05 DIAGNOSIS — I360 Nonrheumatic tricuspid (valve) stenosis: Secondary | ICD-10-CM | POA: Diagnosis not present

## 2016-09-05 DIAGNOSIS — J9690 Respiratory failure, unspecified, unspecified whether with hypoxia or hypercapnia: Secondary | ICD-10-CM | POA: Diagnosis not present

## 2016-09-05 DIAGNOSIS — I5032 Chronic diastolic (congestive) heart failure: Secondary | ICD-10-CM | POA: Diagnosis not present

## 2016-09-05 DIAGNOSIS — Z79890 Hormone replacement therapy (postmenopausal): Secondary | ICD-10-CM | POA: Diagnosis not present

## 2016-09-05 DIAGNOSIS — I160 Hypertensive urgency: Secondary | ICD-10-CM | POA: Diagnosis not present

## 2016-09-05 DIAGNOSIS — M6281 Muscle weakness (generalized): Secondary | ICD-10-CM | POA: Diagnosis not present

## 2016-09-05 DIAGNOSIS — J441 Chronic obstructive pulmonary disease with (acute) exacerbation: Secondary | ICD-10-CM | POA: Diagnosis not present

## 2016-09-05 DIAGNOSIS — I674 Hypertensive encephalopathy: Secondary | ICD-10-CM | POA: Diagnosis not present

## 2016-09-05 DIAGNOSIS — F339 Major depressive disorder, recurrent, unspecified: Secondary | ICD-10-CM | POA: Diagnosis not present

## 2016-09-05 DIAGNOSIS — J449 Chronic obstructive pulmonary disease, unspecified: Secondary | ICD-10-CM | POA: Diagnosis not present

## 2016-09-05 DIAGNOSIS — I169 Hypertensive crisis, unspecified: Secondary | ICD-10-CM | POA: Diagnosis not present

## 2016-09-05 DIAGNOSIS — R488 Other symbolic dysfunctions: Secondary | ICD-10-CM | POA: Diagnosis not present

## 2016-09-05 DIAGNOSIS — R410 Disorientation, unspecified: Secondary | ICD-10-CM | POA: Diagnosis not present

## 2016-09-05 DIAGNOSIS — G9341 Metabolic encephalopathy: Secondary | ICD-10-CM | POA: Diagnosis not present

## 2016-09-05 DIAGNOSIS — R001 Bradycardia, unspecified: Secondary | ICD-10-CM | POA: Diagnosis not present

## 2016-09-05 DIAGNOSIS — I701 Atherosclerosis of renal artery: Secondary | ICD-10-CM | POA: Diagnosis not present

## 2016-09-05 DIAGNOSIS — F22 Delusional disorders: Secondary | ICD-10-CM | POA: Diagnosis not present

## 2016-09-05 DIAGNOSIS — I341 Nonrheumatic mitral (valve) prolapse: Secondary | ICD-10-CM | POA: Diagnosis not present

## 2016-09-05 DIAGNOSIS — F0151 Vascular dementia with behavioral disturbance: Secondary | ICD-10-CM | POA: Diagnosis not present

## 2016-09-05 DIAGNOSIS — Z7982 Long term (current) use of aspirin: Secondary | ICD-10-CM | POA: Diagnosis not present

## 2016-09-05 DIAGNOSIS — N184 Chronic kidney disease, stage 4 (severe): Secondary | ICD-10-CM | POA: Diagnosis not present

## 2016-09-05 DIAGNOSIS — J9600 Acute respiratory failure, unspecified whether with hypoxia or hypercapnia: Secondary | ICD-10-CM | POA: Diagnosis not present

## 2016-09-05 DIAGNOSIS — E569 Vitamin deficiency, unspecified: Secondary | ICD-10-CM | POA: Diagnosis not present

## 2016-09-05 DIAGNOSIS — I272 Pulmonary hypertension, unspecified: Secondary | ICD-10-CM | POA: Diagnosis not present

## 2016-09-05 DIAGNOSIS — F05 Delirium due to known physiological condition: Secondary | ICD-10-CM | POA: Diagnosis not present

## 2016-09-05 DIAGNOSIS — J438 Other emphysema: Secondary | ICD-10-CM | POA: Diagnosis not present

## 2016-09-05 DIAGNOSIS — E119 Type 2 diabetes mellitus without complications: Secondary | ICD-10-CM | POA: Diagnosis not present

## 2016-09-05 DIAGNOSIS — M351 Other overlap syndromes: Secondary | ICD-10-CM | POA: Diagnosis not present

## 2016-09-05 DIAGNOSIS — N19 Unspecified kidney failure: Secondary | ICD-10-CM | POA: Diagnosis not present

## 2016-09-05 DIAGNOSIS — G4733 Obstructive sleep apnea (adult) (pediatric): Secondary | ICD-10-CM | POA: Diagnosis not present

## 2016-09-05 DIAGNOSIS — I509 Heart failure, unspecified: Secondary | ICD-10-CM | POA: Diagnosis not present

## 2016-09-05 DIAGNOSIS — K7200 Acute and subacute hepatic failure without coma: Secondary | ICD-10-CM | POA: Diagnosis not present

## 2016-09-05 DIAGNOSIS — I10 Essential (primary) hypertension: Secondary | ICD-10-CM | POA: Diagnosis not present

## 2016-09-05 DIAGNOSIS — H409 Unspecified glaucoma: Secondary | ICD-10-CM | POA: Diagnosis not present

## 2016-09-05 DIAGNOSIS — R4182 Altered mental status, unspecified: Secondary | ICD-10-CM | POA: Diagnosis not present

## 2016-09-05 DIAGNOSIS — N390 Urinary tract infection, site not specified: Secondary | ICD-10-CM | POA: Diagnosis not present

## 2016-09-05 LAB — CBC
HCT: 32.9 % — ABNORMAL LOW (ref 39.0–52.0)
Hemoglobin: 10.6 g/dL — ABNORMAL LOW (ref 13.0–17.0)
MCH: 29.9 pg (ref 26.0–34.0)
MCHC: 32.2 g/dL (ref 30.0–36.0)
MCV: 92.9 fL (ref 78.0–100.0)
PLATELETS: 154 10*3/uL (ref 150–400)
RBC: 3.54 MIL/uL — AB (ref 4.22–5.81)
RDW: 14.5 % (ref 11.5–15.5)
WBC: 6.1 10*3/uL (ref 4.0–10.5)

## 2016-09-05 LAB — MAGNESIUM: MAGNESIUM: 1.9 mg/dL (ref 1.7–2.4)

## 2016-09-05 LAB — BASIC METABOLIC PANEL
Anion gap: 7 (ref 5–15)
BUN: 45 mg/dL — AB (ref 6–20)
CO2: 23 mmol/L (ref 22–32)
Calcium: 9.7 mg/dL (ref 8.9–10.3)
Chloride: 113 mmol/L — ABNORMAL HIGH (ref 101–111)
Creatinine, Ser: 2.61 mg/dL — ABNORMAL HIGH (ref 0.61–1.24)
GFR calc non Af Amer: 21 mL/min — ABNORMAL LOW (ref >60)
GFR, EST AFRICAN AMERICAN: 25 mL/min — AB (ref >60)
Glucose, Bld: 107 mg/dL — ABNORMAL HIGH (ref 65–99)
POTASSIUM: 4 mmol/L (ref 3.5–5.1)
SODIUM: 143 mmol/L (ref 135–145)

## 2016-09-05 LAB — PHOSPHORUS: PHOSPHORUS: 3.2 mg/dL (ref 2.5–4.6)

## 2016-09-05 LAB — T4, FREE: Free T4: 1.23 ng/dL — ABNORMAL HIGH (ref 0.61–1.12)

## 2016-09-05 LAB — MRSA PCR SCREENING: MRSA by PCR: NEGATIVE

## 2016-09-05 MED ORDER — ATORVASTATIN CALCIUM 40 MG PO TABS
40.0000 mg | ORAL_TABLET | Freq: Every day | ORAL | Status: DC
  Administered 2016-09-05 – 2016-09-21 (×16): 40 mg via ORAL
  Filled 2016-09-05 (×18): qty 1

## 2016-09-05 MED ORDER — FUROSEMIDE 40 MG PO TABS
60.0000 mg | ORAL_TABLET | Freq: Every day | ORAL | Status: DC
  Administered 2016-09-05 – 2016-09-07 (×3): 60 mg via ORAL
  Filled 2016-09-05: qty 1
  Filled 2016-09-05 (×2): qty 3

## 2016-09-05 MED ORDER — AMLODIPINE BESYLATE 10 MG PO TABS
10.0000 mg | ORAL_TABLET | Freq: Every day | ORAL | Status: DC
  Administered 2016-09-05 – 2016-09-08 (×4): 10 mg via ORAL
  Filled 2016-09-05 (×5): qty 1

## 2016-09-05 MED ORDER — HYDRALAZINE HCL 50 MG PO TABS
100.0000 mg | ORAL_TABLET | Freq: Three times a day (TID) | ORAL | Status: DC
  Administered 2016-09-05 – 2016-09-09 (×10): 100 mg via ORAL
  Filled 2016-09-05 (×11): qty 2

## 2016-09-05 MED ORDER — LEVOTHYROXINE SODIUM 25 MCG PO TABS
137.0000 ug | ORAL_TABLET | Freq: Every day | ORAL | Status: DC
  Administered 2016-09-05 – 2016-09-13 (×8): 137 ug via ORAL
  Filled 2016-09-05 (×9): qty 1

## 2016-09-05 MED ORDER — LEVOTHYROXINE SODIUM 137 MCG PO TABS: 137.0000 ug | ORAL_TABLET | Freq: Every day | ORAL | Status: DC

## 2016-09-05 MED ORDER — AMLODIPINE-ATORVASTATIN 10-40 MG PO TABS: 1.0000 | ORAL_TABLET | Freq: Every day | ORAL | Status: DC

## 2016-09-05 MED ORDER — SODIUM CHLORIDE 0.9 % IV SOLN
250.0000 mL | INTRAVENOUS | Status: DC | PRN
  Administered 2016-09-06 (×2): 250 mL via INTRAVENOUS

## 2016-09-05 MED ORDER — HEPARIN SODIUM (PORCINE) 5000 UNIT/ML IJ SOLN
5000.0000 [IU] | Freq: Three times a day (TID) | INTRAMUSCULAR | Status: DC
  Administered 2016-09-05 – 2016-09-24 (×54): 5000 [IU] via SUBCUTANEOUS
  Filled 2016-09-05 (×52): qty 1

## 2016-09-05 MED ORDER — ADULT MULTIVITAMIN W/MINERALS CH
1.0000 | ORAL_TABLET | Freq: Every day | ORAL | Status: DC
  Administered 2016-09-05 – 2016-09-24 (×19): 1 via ORAL
  Filled 2016-09-05 (×22): qty 1

## 2016-09-05 MED ORDER — DOXAZOSIN MESYLATE 4 MG PO TABS
6.0000 mg | ORAL_TABLET | Freq: Every day | ORAL | Status: DC
  Administered 2016-09-05 – 2016-09-08 (×4): 6 mg via ORAL
  Filled 2016-09-05 (×5): qty 1

## 2016-09-05 MED ORDER — ASPIRIN EC 81 MG PO TBEC
81.0000 mg | DELAYED_RELEASE_TABLET | Freq: Every day | ORAL | Status: DC
  Administered 2016-09-05 – 2016-09-24 (×19): 81 mg via ORAL
  Filled 2016-09-05 (×21): qty 1

## 2016-09-05 NOTE — Progress Notes (Addendum)
PULMONARY / CRITICAL CARE MEDICINE   Name: Jeffrey Campbell MRN: [id number] DOB: 1932/04/08    ADMISSION DATE:  09/04/2016  CHIEF COMPLAINT:  Confusion  HISTORY OF PRESENT ILLNESS:   Jeffrey Campbell is an 83 y/o man with a PMHx as below who presents with confusion worsening over last week and severe hypertension. He has a normal MS at baseline, and was well at a recent appt in May. He does have refractory HTN (with RAS and other complicating issues) and does manage his own medications, so it is not known if he has been taking them at all. He does also have significant resting bradycardia, with a junctional rhythm with a rate of 42 at a recent clinic visit. In the ED, he was started on a nicardipine gtt to manage his BP and PCCM was asked to admit.  Sub/Interim History: Nicardipine drip is weaning down. No other issues overnight  PAST MEDICAL HISTORY :  He  has a past medical history of BPH (benign prostatic hyperplasia); CAD (coronary artery disease); Carotid artery stenosis; CKD (chronic kidney disease), stage III; Diastolic dysfunction; Dilated aortic root ([place]); Glaucoma; Graves disease; Heart murmur, systolic; History of ETOH abuse; History of PFTs (05/2010); Hyperlipemia; Hypertension; Hyperthyroidism (08/26/10); Mitral regurgitation (echo 2015); Multiple thyroid nodules; MVP (mitral valve prolapse) (11/2012); Organic impotence; OSA (obstructive sleep apnea); PUD (peptic ulcer disease); Pulmonary hypertension ([place]) (echo 2015); Shutdown renal; Type 2 diabetes, diet controlled ([place]); and Upper airway resistance syndrome.  PAST SURGICAL HISTORY: He  has a past surgical history that includes Appendectomy; heart catherization; Hernia repair (10/2009); Cardiac catheterization; Cardiac catheterization (N/A, 02/17/2015); and Right Heart Cath (N/A, 07/28/2016).  Allergies  Allergen Reactions  . Tiotropium Bromide Monohydrate Other (See Comments)    Dry mouth  . Budesonide-Formoterol Fumarate     Dry mouth  .  Other     DYE- Causes kidney to shut down  . Tricor [Fenofibrate]     Unknown   . Zocor [Simvastatin] Other (See Comments)    Not sure Liver problems     No current facility-administered medications on file prior to encounter.    Current Outpatient Prescriptions on File Prior to Encounter  Medication Sig  . amLODipine-atorvastatin (CADUET) 10-40 MG tablet TAKE 1 TABLET BY MOUTH DAILY. (Patient taking differently: Take 1 tablet by mouth at bedtime. )  . Brinzolamide-Brimonidine (SIMBRINZA) 1-0.2 % SUSP Place 1 drop into both eyes 2 (two) times daily.   Marland Kitchen doxazosin (CARDURA) 2 MG tablet TAKE 3 TABLETS BY MOUTH EVERY DAY (Patient taking differently: Take 1 tablet (2 mg) by mouth 3 times daily)  . furosemide (LASIX) 40 MG tablet Take 1.5 tablets (60 mg total) by mouth daily. (Patient taking differently: Take 40 mg by mouth daily. )  . hydrALAZINE (APRESOLINE) 100 MG tablet TAKE 1 TABLET (100 MG TOTAL) BY MOUTH 3 (THREE) TIMES DAILY. (Patient taking differently: Take 100 mg by mouth 3 (three) times daily. )  . levothyroxine (SYNTHROID, LEVOTHROID) 137 MCG tablet Take 137 mcg by mouth daily before breakfast.   . potassium chloride (K-DUR,KLOR-CON) 10 MEQ tablet Take 1 tablet (10 mEq total) by mouth daily.    FAMILY HISTORY:  His indicated that his mother is deceased. He indicated that his father is deceased. He indicated that three of his six sisters are alive. He indicated that two of his three brothers are alive. He indicated that his maternal grandmother is deceased. He indicated that his maternal grandfather is deceased. He indicated that his paternal grandmother is deceased.  He indicated that his paternal grandfather is deceased. He indicated that the status of his maternal uncle is unknown.    SOCIAL HISTORY: He  reports that he quit smoking about 32 years ago. His smoking use included Cigarettes. He has a 80.00 pack-year smoking history. He has never used smokeless tobacco. He reports  that he does not drink alcohol or use drugs.   VITAL SIGNS: BP (!) 184/60   Pulse (!) 46   Temp 97.8 F (36.6 C) (Oral)   Resp 17   Ht 5\' 10"  (1.778 m)   Wt 124 lb 5.4 oz (56.4 kg)   SpO2 95%   BMI 17.84 kg/m   INTAKE / OUTPUT: I/O last 3 completed shifts: In: 180.7 [I.V.:180.7] Out: 100 [Urine:100]  PHYSICAL EXAMINATION: Gen:      No acute distress HEENT:  EOMI, sclera anicteric, exopthalmos Neck:     No masses; no thyromegaly Lungs:    Clear to auscultation bilaterally; normal respiratory effort CV:         Regular rate and rhythm; no murmurs Abd:      + bowel sounds; soft, non-tender; no palpable masses, no distension Ext:    No edema; adequate peripheral perfusion Skin:      Warm and dry; no rash Neuro: alert and oriented x 3 Psych: normal mood and affect   LABS:  BMET  Recent Labs Lab 09/04/16 2039 09/05/16 0702  NA 140 143  K 4.4 4.0  CL 111 113*  CO2 22 23  BUN 45* 45*  CREATININE 2.83* 2.61*  GLUCOSE 109* 107*    Electrolytes  Recent Labs Lab 09/04/16 2039 09/05/16 0702  CALCIUM 9.8 9.7  MG  --  1.9  PHOS  --  3.2    CBC  Recent Labs Lab 09/04/16 2039 09/05/16 0702  WBC 7.0 6.1  HGB 10.7* 10.6*  HCT 33.0* 32.9*  PLT 171 154    Coag's No results for input(s): APTT, INR in the last 168 hours.  Sepsis Markers No results for input(s): LATICACIDVEN, PROCALCITON, O2SATVEN in the last 168 hours.  ABG No results for input(s): PHART, PCO2ART, PO2ART in the last 168 hours.  Liver Enzymes No results for input(s): AST, ALT, ALKPHOS, BILITOT, ALBUMIN in the last 168 hours.  Cardiac Enzymes No results for input(s): TROPONINI, PROBNP in the last 168 hours.  Glucose  Recent Labs Lab 09/04/16 2038  GLUCAP 103*    Imaging Dg Chest 2 View  Result Date: 09/04/2016 CLINICAL DATA:  Unsteady while standing. Cough and altered mental status. EXAM: CHEST  2 VIEW COMPARISON:  01/28/2015 CXR, chest CT 05/12/2016 FINDINGS: Stable  cardiomegaly with aortic atherosclerosis. Mild central vascular congestion. No pneumonic consolidation, effusion or pneumothorax. No dominant mass is seen. No acute nor suspicious osseous abnormality. IMPRESSION: Cardiomegaly with aortic atherosclerosis. Mild vascular congestion since prior exam is noted. Electronically Signed   By: Ashley Royalty M.D.   On: 09/04/2016 22:42   Ct Head Wo Contrast  Result Date: 09/04/2016 CLINICAL DATA:  Altered mental status EXAM: CT HEAD WITHOUT CONTRAST TECHNIQUE: Contiguous axial images were obtained from the base of the skull through the vertex without intravenous contrast. COMPARISON:  None. FINDINGS: BRAIN: There is sulcal and ventricular prominence consistent with superficial and central atrophy. No intraparenchymal hemorrhage, mass effect nor midline shift. Periventricular and subcortical white matter hypodensities consistent with chronic small vessel ischemic disease are identified. No acute large vascular territory infarcts. No abnormal extra-axial fluid collections. Basal cisterns are not effaced and midline.  VASCULAR: Moderate calcific atherosclerosis of the carotid siphons. SKULL: No skull fracture. No significant scalp soft tissue swelling. SINUSES/ORBITS: The mastoid air-cells are clear. The included paranasal sinuses are well-aerated.The included ocular globes and orbital contents are non-suspicious. OTHER: None. IMPRESSION: 1. Chronic moderate degree small vessel ischemia with cerebral atrophy. 2. No acute intracranial abnormality. Electronically Signed   By: Ashley Royalty M.D.   On: 09/04/2016 22:12    ASSESSMENT / PLAN: 83 y/o man with hypertensive emergency and hypertensive encephalopathy now being treated with nicardipine gtt. Pt does not recall missing any home meds  Will continue home meds and hopefully be able to get off gtt with a day or so.  Hypertensive emergency Wean down nicardipine drip Started on home meds  Mild COPD OSA Non compliant with  inhalers or CPAP  Pulmonary venous HTN Bradycardia Follows with cardiology  CKD Follow urine output and Cr  Hypothyroidism TSH is normal Continue synthyroid  Keep in ICU till off the nicardipine drip  The patient is critically ill with multiple organ system failure and requires high complexity decision making for assessment and support, frequent evaluation and titration of therapies, advanced monitoring, review of radiographic studies and interpretation of complex data.   Additional Critical Care Time devoted to patient care services, exclusive of separately billable procedures, described in this note is 35 minutes.   Marshell Garfinkel MD [facility] Pager [phone] If no answer or after 3pm call: [phone] 09/05/2016, 9:17 AM

## 2016-09-05 NOTE — Plan of Care (Signed)
Problem: Safety: Goal: Ability to remain free from injury will improve Outcome: Progressing Tele sitter present. Bilateral soft mittens on. Bed in low position. Call light in reach. Room near nurses station

## 2016-09-05 NOTE — H&P (Signed)
PULMONARY / CRITICAL CARE MEDICINE   Name: Jeffrey Campbell MRN: [id number] DOB: 1932/08/02    ADMISSION DATE:  09/04/2016  CHIEF COMPLAINT:  Confusion  HISTORY OF PRESENT ILLNESS:   Jeffrey Campbell is an 83 y/o man with a PMHx as below who presents with confusion worsening over last week and severe hypertension. He has a normal MS at baseline, and was well at a recent appt in May. He does have refractory HTN (with RAS and other complicating issues) and does manage his own medications, so it is not known if he has been taking them at all. He does also have significant resting bradycardia, with a junctional rhythm with a rate of 42 at a recent clinic visit. In the ED, he was started on a nicardipine gtt to manage his BP and PCCM was asked to admit.  PAST MEDICAL HISTORY :  He  has a past medical history of BPH (benign prostatic hyperplasia); CAD (coronary artery disease); Carotid artery stenosis; CKD (chronic kidney disease), stage III; Diastolic dysfunction; Dilated aortic root ([place]); Glaucoma; Graves disease; Heart murmur, systolic; History of ETOH abuse; History of PFTs (05/2010); Hyperlipemia; Hypertension; Hyperthyroidism (08/26/10); Mitral regurgitation (echo 2015); Multiple thyroid nodules; MVP (mitral valve prolapse) (11/2012); Organic impotence; OSA (obstructive sleep apnea); PUD (peptic ulcer disease); Pulmonary hypertension ([place]) (echo 2015); Shutdown renal; Type 2 diabetes, diet controlled ([place]); and Upper airway resistance syndrome.  PAST SURGICAL HISTORY: He  has a past surgical history that includes Appendectomy; heart catherization; Hernia repair (10/2009); Cardiac catheterization; Cardiac catheterization (N/A, 02/17/2015); and Right Heart Cath (N/A, 07/28/2016).  Allergies  Allergen Reactions  . Tiotropium Bromide Monohydrate Other (See Comments)    Dry mouth  . Budesonide-Formoterol Fumarate     Dry mouth  . Other     DYE- Causes kidney to shut down  . Tricor [Fenofibrate]     Unknown    . Zocor [Simvastatin] Other (See Comments)    Not sure Liver problems     No current facility-administered medications on file prior to encounter.    Current Outpatient Prescriptions on File Prior to Encounter  Medication Sig  . amLODipine-atorvastatin (CADUET) 10-40 MG tablet TAKE 1 TABLET BY MOUTH DAILY. (Patient taking differently: Take 1 tablet by mouth at bedtime. )  . Brinzolamide-Brimonidine (SIMBRINZA) 1-0.2 % SUSP Place 1 drop into both eyes 2 (two) times daily.   Marland Kitchen doxazosin (CARDURA) 2 MG tablet TAKE 3 TABLETS BY MOUTH EVERY DAY (Patient taking differently: Take 1 tablet (2 mg) by mouth 3 times daily)  . furosemide (LASIX) 40 MG tablet Take 1.5 tablets (60 mg total) by mouth daily. (Patient taking differently: Take 40 mg by mouth daily. )  . hydrALAZINE (APRESOLINE) 100 MG tablet TAKE 1 TABLET (100 MG TOTAL) BY MOUTH 3 (THREE) TIMES DAILY. (Patient taking differently: Take 100 mg by mouth 3 (three) times daily. )  . levothyroxine (SYNTHROID, LEVOTHROID) 137 MCG tablet Take 137 mcg by mouth daily before breakfast.   . potassium chloride (K-DUR,KLOR-CON) 10 MEQ tablet Take 1 tablet (10 mEq total) by mouth daily.    FAMILY HISTORY:  His indicated that his mother is deceased. He indicated that his father is deceased. He indicated that three of his six sisters are alive. He indicated that two of his three brothers are alive. He indicated that his maternal grandmother is deceased. He indicated that his maternal grandfather is deceased. He indicated that his paternal grandmother is deceased. He indicated that his paternal grandfather is deceased. He indicated that the  status of his maternal uncle is unknown.    SOCIAL HISTORY: He  reports that he quit smoking about 32 years ago. His smoking use included Cigarettes. He has a 80.00 pack-year smoking history. He has never used smokeless tobacco. He reports that he does not drink alcohol or use drugs.   VITAL SIGNS: BP (!) 188/36 (BP  Location: Left Arm)   Pulse (!) 43   Temp 98.2 F (36.8 C) (Oral)   Resp 16   Ht 5\' 10"  (1.778 m)   Wt 124 lb 5.4 oz (56.4 kg)   SpO2 96%   BMI 17.84 kg/m   INTAKE / OUTPUT: No intake/output data recorded.  PHYSICAL EXAMINATION: General:  Elderly man, asleep Neuro:  Readily awakes to voice with normal speech pattern, but clear confusion in content. HEENT:  Exophthalmos present. Cardiovascular:  Significant bradycardia and elevated BP, but normal heart sounds. Lungs:  CTA Abdomen:  Soft Musculoskeletal:  Thin, but no deformities Skin:  No rashes on visible skin.  LABS:  BMET  Recent Labs Lab 09/04/16 2039  NA 140  K 4.4  CL 111  CO2 22  BUN 45*  CREATININE 2.83*  GLUCOSE 109*    Electrolytes  Recent Labs Lab 09/04/16 2039  CALCIUM 9.8    CBC  Recent Labs Lab 09/04/16 2039  WBC 7.0  HGB 10.7*  HCT 33.0*  PLT 171    Coag's No results for input(s): APTT, INR in the last 168 hours.  Sepsis Markers No results for input(s): LATICACIDVEN, PROCALCITON, O2SATVEN in the last 168 hours.  ABG No results for input(s): PHART, PCO2ART, PO2ART in the last 168 hours.  Liver Enzymes No results for input(s): AST, ALT, ALKPHOS, BILITOT, ALBUMIN in the last 168 hours.  Cardiac Enzymes No results for input(s): TROPONINI, PROBNP in the last 168 hours.  Glucose  Recent Labs Lab 09/04/16 2038  GLUCAP 103*    Imaging Dg Chest 2 View  Result Date: 09/04/2016 CLINICAL DATA:  Unsteady while standing. Cough and altered mental status. EXAM: CHEST  2 VIEW COMPARISON:  01/28/2015 CXR, chest CT 05/12/2016 FINDINGS: Stable cardiomegaly with aortic atherosclerosis. Mild central vascular congestion. No pneumonic consolidation, effusion or pneumothorax. No dominant mass is seen. No acute nor suspicious osseous abnormality. IMPRESSION: Cardiomegaly with aortic atherosclerosis. Mild vascular congestion since prior exam is noted. Electronically Signed   By: Ashley Royalty M.D.    On: 09/04/2016 22:42   Ct Head Wo Contrast  Result Date: 09/04/2016 CLINICAL DATA:  Altered mental status EXAM: CT HEAD WITHOUT CONTRAST TECHNIQUE: Contiguous axial images were obtained from the base of the skull through the vertex without intravenous contrast. COMPARISON:  None. FINDINGS: BRAIN: There is sulcal and ventricular prominence consistent with superficial and central atrophy. No intraparenchymal hemorrhage, mass effect nor midline shift. Periventricular and subcortical white matter hypodensities consistent with chronic small vessel ischemic disease are identified. No acute large vascular territory infarcts. No abnormal extra-axial fluid collections. Basal cisterns are not effaced and midline. VASCULAR: Moderate calcific atherosclerosis of the carotid siphons. SKULL: No skull fracture. No significant scalp soft tissue swelling. SINUSES/ORBITS: The mastoid air-cells are clear. The included paranasal sinuses are well-aerated.The included ocular globes and orbital contents are non-suspicious. OTHER: None. IMPRESSION: 1. Chronic moderate degree small vessel ischemia with cerebral atrophy. 2. No acute intracranial abnormality. Electronically Signed   By: Ashley Royalty M.D.   On: 09/04/2016 22:12    ASSESSMENT / PLAN:  83 y/o man with hypertensive emergency and hypertensive encephalopathy now being treated  with nicardipine gtt. Will restart home meds and hopefully be able to get off gtt with a day or so.   CRITICAL CARE Performed by: Luz Brazen   Total critical care time: 30 minutes  Critical care time was exclusive of separately billable procedures and treating other patients.  Critical care was necessary to treat or prevent imminent or life-threatening deterioration.  Critical care was time spent personally by me on the following activities: development of treatment plan with patient and/or surrogate as well as nursing, discussions with consultants, evaluation of patient's response to  treatment, examination of patient, obtaining history from patient or surrogate, ordering and performing treatments and interventions, ordering and review of laboratory studies, ordering and review of radiographic studies, pulse oximetry and re-evaluation of patient's condition.  Luz Brazen, MD Pulmonary and [place] Pager: [phone]  09/05/2016, 4:27 AM

## 2016-09-06 ENCOUNTER — Inpatient Hospital Stay (HOSPITAL_COMMUNITY): Payer: Medicare Other

## 2016-09-06 DIAGNOSIS — G9340 Encephalopathy, unspecified: Secondary | ICD-10-CM

## 2016-09-06 DIAGNOSIS — G4733 Obstructive sleep apnea (adult) (pediatric): Secondary | ICD-10-CM

## 2016-09-06 DIAGNOSIS — R0902 Hypoxemia: Secondary | ICD-10-CM

## 2016-09-06 DIAGNOSIS — I360 Nonrheumatic tricuspid (valve) stenosis: Secondary | ICD-10-CM

## 2016-09-06 LAB — CBC
HCT: 33.3 % — ABNORMAL LOW (ref 39.0–52.0)
HEMOGLOBIN: 10.7 g/dL — AB (ref 13.0–17.0)
MCH: 29.9 pg (ref 26.0–34.0)
MCHC: 32.1 g/dL (ref 30.0–36.0)
MCV: 93 fL (ref 78.0–100.0)
Platelets: 153 10*3/uL (ref 150–400)
RBC: 3.58 MIL/uL — ABNORMAL LOW (ref 4.22–5.81)
RDW: 14.7 % (ref 11.5–15.5)
WBC: 6.5 10*3/uL (ref 4.0–10.5)

## 2016-09-06 LAB — BASIC METABOLIC PANEL
Anion gap: 9 (ref 5–15)
BUN: 50 mg/dL — AB (ref 6–20)
CO2: 22 mmol/L (ref 22–32)
CREATININE: 2.94 mg/dL — AB (ref 0.61–1.24)
Calcium: 9.4 mg/dL (ref 8.9–10.3)
Chloride: 111 mmol/L (ref 101–111)
GFR calc Af Amer: 22 mL/min — ABNORMAL LOW (ref >60)
GFR calc non Af Amer: 19 mL/min — ABNORMAL LOW (ref >60)
GLUCOSE: 104 mg/dL — AB (ref 65–99)
Potassium: 3.9 mmol/L (ref 3.5–5.1)
SODIUM: 142 mmol/L (ref 135–145)

## 2016-09-06 LAB — ECHOCARDIOGRAM COMPLETE
Height: 70 in
Weight: 2021.18 oz

## 2016-09-06 LAB — MAGNESIUM: MAGNESIUM: 2 mg/dL (ref 1.7–2.4)

## 2016-09-06 LAB — PHOSPHORUS: Phosphorus: 3.7 mg/dL (ref 2.5–4.6)

## 2016-09-06 MED ORDER — LORAZEPAM 2 MG/ML IJ SOLN
INTRAMUSCULAR | Status: AC
  Filled 2016-09-06: qty 1

## 2016-09-06 MED ORDER — HYDRALAZINE HCL 20 MG/ML IJ SOLN
INTRAMUSCULAR | Status: AC
  Filled 2016-09-06: qty 2

## 2016-09-06 MED ORDER — ENSURE ENLIVE PO LIQD
237.0000 mL | Freq: Two times a day (BID) | ORAL | Status: DC
  Administered 2016-09-07 – 2016-09-24 (×28): 237 mL via ORAL

## 2016-09-06 MED ORDER — LORAZEPAM 2 MG/ML IJ SOLN
2.0000 mg | Freq: Once | INTRAMUSCULAR | Status: AC
  Administered 2016-09-06: 2 mg via INTRAVENOUS

## 2016-09-06 MED ORDER — HALOPERIDOL LACTATE 5 MG/ML IJ SOLN
1.0000 mg | INTRAMUSCULAR | Status: DC | PRN
  Administered 2016-09-06 – 2016-09-07 (×3): 2 mg via INTRAVENOUS
  Filled 2016-09-06 (×4): qty 1

## 2016-09-06 MED ORDER — HYDRALAZINE HCL 20 MG/ML IJ SOLN
10.0000 mg | INTRAMUSCULAR | Status: DC | PRN
  Administered 2016-09-06: 40 mg via INTRAVENOUS
  Administered 2016-09-07: 20 mg via INTRAVENOUS
  Administered 2016-09-08 – 2016-09-09 (×3): 40 mg via INTRAVENOUS
  Filled 2016-09-06: qty 1
  Filled 2016-09-06 (×4): qty 2

## 2016-09-06 NOTE — Progress Notes (Signed)
Initial Nutrition Assessment  DOCUMENTATION CODES:   Severe malnutrition in context of chronic illness  INTERVENTION:   Ensure Enlive po BID, each supplement provides 350 kcal and 20 grams of protein   NUTRITION DIAGNOSIS:   Malnutrition (Severe) related to chronic illness (CAD, CKD, ETOH use) as evidenced by severe depletion of muscle mass, severe depletion of body fat.  GOAL:   Patient will meet greater than or equal to 90% of their needs  MONITOR:   PO intake, Supplement acceptance, Labs  REASON FOR ASSESSMENT:   Malnutrition Screening Tool    ASSESSMENT:   Pt with PMH of CAD, CKD stage III, ETOH abuse, HTN, OSA, PUD, pulmonary HTN, DM admitted with worsening confusion and hypertensive emergency and hypertensive encephalopathy.    Meal Completion: 30-50%  Medications reviewed and include: synthroid, MVI Labs reviewed: PO4: 3.7, magnesium 2.0  Pt in 4 point restraints. RN in room pt very confused trying to get out of bed. Per RN pt has pulled off condom cath 4 times this am. Pt unlikely to keep a feeding tube in at this time. Lunch untouched. When asked pt if he would drink something he was agreeable but when offered he declined.  Pt with hx of ETOH, unsure of how recent.   Nutrition-Focused physical exam completed. Findings are severe fat depletion, severe muscle depletion, and no edema.    Diet Order:  Diet renal/carb modified with fluid restriction Diet-HS Snack? Nothing; Room service appropriate? Yes; Fluid consistency: Thin  Skin:  Reviewed, no issues  Last BM:  unknown  Height:   Ht Readings from Last 1 Encounters:  09/04/16 5\' 10"  (1.778 m)    Weight:   Wt Readings from Last 1 Encounters:  09/06/16 126 lb 5.2 oz (57.3 kg)    Ideal Body Weight:  75.4 kg  BMI:  Body mass index is 18.13 kg/m.  Estimated Nutritional Needs:   Kcal:  1700-1900  Protein:  85-100 grams  Fluid:  > 1.7 L/day  EDUCATION NEEDS:   No education needs identified  at this time  [place], [place], [place] Pager [phone] After Hours Pager

## 2016-09-06 NOTE — Progress Notes (Addendum)
PULMONARY / CRITICAL CARE MEDICINE   Name: Jeffrey Campbell MRN: [id number] DOB: Jul 24, 1932    ADMISSION DATE:  09/04/2016  CHIEF COMPLAINT:  Confusion  HISTORY OF PRESENT ILLNESS:   Jeffrey Campbell is an 83 y/o man with a PMHx as below who presents with confusion worsening over last week and severe hypertension. He has a normal MS at baseline, and was well at a recent appt in May. He does have refractory HTN (with RAS and other complicating issues) and does manage his own medications, so it is not known if he has been taking them at all. He does also have significant resting bradycardia, with a junctional rhythm with a rate of 42 at a recent clinic visit. In the ED, he was started on a nicardipine gtt to manage his BP and PCCM was asked to admit.  Sub/Interim History: Remains on cardene drip of variable titration, did not take oral anti-HTN due to confusion  VITAL SIGNS: BP (!) 174/144   Pulse 63   Temp 98.6 F (37 C) (Oral)   Resp (!) 21   Ht 5\' 10"  (1.778 m)   Wt 57.3 kg (126 lb 5.2 oz)   SpO2 96%   BMI 18.13 kg/m    INTAKE / OUTPUT: I/O last 3 completed shifts: In: 1520.7 [P.O.:840; I.V.:680.7] Out: 375 [Urine:375]  PHYSICAL EXAMINATION: Gen:      Confused at times but easily redirectionable. HEENT:  [place]/AT, PERRL, EOM-I and MMM Neck:      No masses; no thyromegaly Lungs:    CTA bilaterally CV:         RRR, Nl S1/S2, -M/R/G. Abd:      Soft, NT, ND and +BS Ext:    No edema; adequate peripheral perfusion Skin:       Warm and dry; no rash Neuro:    Awake and interactive, confused, moving all ext to command Psych:   Confused but redirectionable   LABS:  BMET  Recent Labs Lab 09/04/16 2039 09/05/16 0702 09/06/16 0230  NA 140 143 142  K 4.4 4.0 3.9  CL 111 113* 111  CO2 22 23 22   BUN 45* 45* 50*  CREATININE 2.83* 2.61* 2.94*  GLUCOSE 109* 107* 104*   Electrolytes  Recent Labs Lab 09/04/16 2039 09/05/16 0702 09/06/16 0230  CALCIUM 9.8 9.7 9.4  MG  --  1.9 2.0   PHOS  --  3.2 3.7   CBC  Recent Labs Lab 09/04/16 2039 09/05/16 0702 09/06/16 0230  WBC 7.0 6.1 6.5  HGB 10.7* 10.6* 10.7*  HCT 33.0* 32.9* 33.3*  PLT 171 154 153   Coag's No results for input(s): APTT, INR in the last 168 hours.  Sepsis Markers No results for input(s): LATICACIDVEN, PROCALCITON, O2SATVEN in the last 168 hours.  ABG No results for input(s): PHART, PCO2ART, PO2ART in the last 168 hours.  Liver Enzymes No results for input(s): AST, ALT, ALKPHOS, BILITOT, ALBUMIN in the last 168 hours.  Cardiac Enzymes No results for input(s): TROPONINI, PROBNP in the last 168 hours.  Glucose  Recent Labs Lab 09/04/16 2038  GLUCAP 103*   Imaging Dg Chest Port 1 View  Result Date: 09/06/2016 CLINICAL DATA:  Respiratory failure EXAM: PORTABLE CHEST 1 VIEW COMPARISON:  09/04/2016 FINDINGS: Improvement in vascular congestion. Cardiac enlargement. Negative for edema or effusion. Mild left lower lobe airspace disease has developed since the prior study most likely atelectasis. IMPRESSION: Improving vascular congestion. Mild left lower lobe atelectasis. Electronically Signed   By: Franchot Gallo  M.D.   On: 09/06/2016 07:00   ASSESSMENT / PLAN: 83 y/o man with hypertensive emergency and hypertensive encephalopathy now being treated with nicardipine gtt. Pt does not recall missing any home meds  Will continue home meds and hopefully be able to get off gtt with a day or so.  Hypertensive emergency Change goal from SBP of 200 to 185 Start home meds and add cardura, hopefully can take PO this AM  Mild COPD OSA Non compliant with inhalers or CPAP Will hold inhalers here since he does not take at home anyway CPAP will be offered when more clear.  Pulmonary venous HTN Bradycardia Follows with cardiology, can follow as outpatient  Acute encephalopathy: QT is 0.37 Haldol 1-2 q3 hours IV PRN hold for sedation Avoid sedating medications Ambulate OOB to chair  CKD Follow  urine output and Cr  Hypothyroidism TSH is normal Continue synthyroid  Keep in ICU till off the nicardipine drip  The patient is critically ill with multiple organ systems failure and requires high complexity decision making for assessment and support, frequent evaluation and titration of therapies, application of advanced monitoring technologies and extensive interpretation of multiple databases.   Critical Care Time devoted to patient care services described in this note is  35  Minutes. This time reflects time of care of this signee Dr Jennet Maduro. This critical care time does not reflect procedure time, or teaching time or supervisory time of PA/NP/Med student/Med Resident etc but could involve care discussion time.  Rush Farmer, M.D. [facility] Pulmonary/Critical Care Medicine. Pager: [phone]. After hours pager: [phone].

## 2016-09-06 NOTE — Progress Notes (Signed)
[place] Progress Note Patient Name: Jeffrey Campbell DOB: December 08, 1932 MRN: [id number]   Date of Service  09/06/2016  HPI/Events of Note  Agitation, hypertensive encephalopathy but BP better, not responding to haldol; requiring restraints because trying to leave, very confused, not resting, EtOH abuse in past, uncertain if current use  eICU Interventions  Trial ativan 2mg  IV x1 dose     Intervention Category Major Interventions: Change in mental status - evaluation and management  [name] 09/06/2016, 4:05 PM

## 2016-09-06 NOTE — Progress Notes (Signed)
   09/06/16 1000  Clinical Encounter Type  Visited With Patient  Visit Type Initial  Referral From Nurse    Patient resting. Dropped off paperwork.

## 2016-09-06 NOTE — Progress Notes (Signed)
[place] Progress Note Patient Name: Jeffrey Campbell DOB: Nov 14, 1932 MRN: [id number]   Date of Service  09/06/2016  HPI/Events of Note  Increasing BP, no PRN IV med  eICU Interventions  Hydralazine prn SBP > 160     Intervention Category Major Interventions: Hypertension - evaluation and management  [name] 09/06/2016, 4:29 PM

## 2016-09-06 NOTE — Progress Notes (Signed)
Pt received from [place] per bed. Moved to step down ICU bed in 3M05 CHG bath done new gown, 4 point restraints and soft waist restraint on. Family here at bedside

## 2016-09-06 NOTE — Progress Notes (Signed)
  Echocardiogram 2D Echocardiogram has been performed.  Jeffrey Campbell 09/06/2016, 9:05 AM

## 2016-09-07 DIAGNOSIS — I272 Pulmonary hypertension, unspecified: Secondary | ICD-10-CM

## 2016-09-07 DIAGNOSIS — J438 Other emphysema: Secondary | ICD-10-CM

## 2016-09-07 LAB — BASIC METABOLIC PANEL
Anion gap: 9 (ref 5–15)
BUN: 51 mg/dL — AB (ref 6–20)
CHLORIDE: 110 mmol/L (ref 101–111)
CO2: 24 mmol/L (ref 22–32)
CREATININE: 2.93 mg/dL — AB (ref 0.61–1.24)
Calcium: 10 mg/dL (ref 8.9–10.3)
GFR calc Af Amer: 22 mL/min — ABNORMAL LOW (ref >60)
GFR calc non Af Amer: 19 mL/min — ABNORMAL LOW (ref >60)
Glucose, Bld: 80 mg/dL (ref 65–99)
Potassium: 3.8 mmol/L (ref 3.5–5.1)
Sodium: 143 mmol/L (ref 135–145)

## 2016-09-07 LAB — PHOSPHORUS: PHOSPHORUS: 4.4 mg/dL (ref 2.5–4.6)

## 2016-09-07 LAB — CBC
HCT: 34.3 % — ABNORMAL LOW (ref 39.0–52.0)
HEMOGLOBIN: 11 g/dL — AB (ref 13.0–17.0)
MCH: 30 pg (ref 26.0–34.0)
MCHC: 32.1 g/dL (ref 30.0–36.0)
MCV: 93.5 fL (ref 78.0–100.0)
Platelets: 148 10*3/uL — ABNORMAL LOW (ref 150–400)
RBC: 3.67 MIL/uL — ABNORMAL LOW (ref 4.22–5.81)
RDW: 14.9 % (ref 11.5–15.5)
WBC: 6.7 10*3/uL (ref 4.0–10.5)

## 2016-09-07 LAB — MAGNESIUM: Magnesium: 2.1 mg/dL (ref 1.7–2.4)

## 2016-09-07 MED ORDER — FUROSEMIDE 40 MG PO TABS
40.0000 mg | ORAL_TABLET | Freq: Every day | ORAL | Status: DC
  Administered 2016-09-08 – 2016-09-09 (×2): 40 mg via ORAL
  Filled 2016-09-07 (×2): qty 1

## 2016-09-07 NOTE — Evaluation (Signed)
Physical Therapy Evaluation Patient Details Name: Jeffrey Campbell MRN: [id number] DOB: 1932-11-23 Today's Date: 09/07/2016   History of Present Illness  Pt adm with hypertensive emergency and acute encephalopathy. PMH - DM, CAD, CKD, etoh abuse, diastolic dysfunction  Clinical Impression  Pt admitted with above diagnosis and presents to PT with functional limitations due to deficits listed below (See PT problem list). Pt needs skilled PT to maximize independence and safety to allow discharge to ST-SNF unless confusion and mobility improve enough for wife to manage.      Follow Up Recommendations SNF (unless confusion and mobility improve)    Equipment Recommendations  Other (comment) (To be assessed)    Recommendations for Other Services       Precautions / Restrictions Precautions Precautions: Fall Restrictions Weight Bearing Restrictions: No      Mobility  Bed Mobility Overal bed mobility: Needs Assistance Bed Mobility: Supine to Sit     Supine to sit: Min assist     General bed mobility comments: Assist to initiate and to elevate trunk into sitting  Transfers Overall transfer level: Needs assistance Equipment used: 1 person hand held assist Transfers: Sit to/from Stand Sit to Stand: Min assist         General transfer comment: Assist for balance  Ambulation/Gait Ambulation/Gait assistance: Min assist;+2 safety/equipment Ambulation Distance (Feet): 300 Feet Assistive device: 1 person hand held assist Gait Pattern/deviations: Step-through pattern;Decreased stride length;Drifts right/left Gait velocity: decr Gait velocity interpretation: Below normal speed for age/gender General Gait Details: Assist for balance and support. VSS stable. SpO2 98% on RA  Stairs            Wheelchair Mobility    Modified Rankin (Stroke Patients Only)       Balance Overall balance assessment: Needs assistance Sitting-balance support: No upper extremity supported;Feet  supported Sitting balance-Leahy Scale: Fair     Standing balance support: Single extremity supported Standing balance-Leahy Scale: Poor Standing balance comment: UE support and min A for static standing                             Pertinent Vitals/Pain Pain Assessment: No/denies pain    Home Living Family/patient expects to be discharged to:: Private residence Living Arrangements: Spouse/significant other Available Help at Discharge: Family;Available 24 hours/day (wife uses cane, in hospital lately, can't assist physically) Type of Home: House Home Access: Level entry     Home Layout: One level Home Equipment: None      Prior Function Level of Independence: Independent               Hand Dominance        Extremity/Trunk Assessment   Upper Extremity Assessment Upper Extremity Assessment: Generalized weakness    Lower Extremity Assessment Lower Extremity Assessment: Generalized weakness       Communication   Communication: No difficulties  Cognition Arousal/Alertness: Awake/alert Behavior During Therapy: WFL for tasks assessed/performed Overall Cognitive Status: Impaired/Different from baseline Area of Impairment: Orientation;Attention;Memory;Following commands;Safety/judgement;Problem solving                 Orientation Level: Disoriented to;Place;Time;Situation Current Attention Level: Sustained Memory: Decreased recall of precautions;Decreased short-term memory Following Commands: Follows one step commands consistently Safety/Judgement: Decreased awareness of safety;Decreased awareness of deficits   Problem Solving: Slow processing;Decreased initiation;Requires verbal cues;Requires tactile cues;Difficulty sequencing        General Comments      Exercises     Assessment/Plan  PT Assessment Patient needs continued PT services  PT Problem List Decreased strength;Decreased activity tolerance;Decreased balance;Decreased  mobility;Decreased cognition;Decreased knowledge of use of DME;Decreased safety awareness;Decreased knowledge of precautions       PT Treatment Interventions DME instruction;Gait training;Stair training;Functional mobility training;Balance training;Therapeutic exercise;Therapeutic activities;Patient/family education;Cognitive remediation    PT Goals (Current goals can be found in the Care Plan section)  Acute Rehab PT Goals Patient Stated Goal: Not stated PT Goal Formulation: With patient Time For Goal Achievement: 09/21/16 Potential to Achieve Goals: Good    Frequency Min 3X/week   Barriers to discharge Decreased caregiver support wife recently hospitalized and uses cane. Unable to physically assist    Co-evaluation               AM-PAC PT "6 Clicks" Daily Activity  Outcome Measure Difficulty turning over in bed (including adjusting bedclothes, sheets and blankets)?: A Little Difficulty moving from lying on back to sitting on the side of the bed? : Total Difficulty sitting down on and standing up from a chair with arms (e.g., wheelchair, bedside commode, etc,.)?: Total Help needed moving to and from a bed to chair (including a wheelchair)?: A Little Help needed walking in hospital room?: A Little Help needed climbing 3-5 steps with a railing? : A Little 6 Click Score: 14    End of Session Equipment Utilized During Treatment: Gait belt Activity Tolerance: Patient tolerated treatment well Patient left: in bed;with bed alarm set;with restraints reapplied (per nursing request for IV placement) Nurse Communication: Mobility status PT Visit Diagnosis: Unsteadiness on feet (R26.81);Muscle weakness (generalized) (M62.81)    Time: 8889-1694 PT Time Calculation (min) (ACUTE ONLY): 26 min   Charges:   PT Evaluation $PT Eval Moderate Complexity: 1 Procedure PT Treatments $Gait Training: 8-22 mins   PT G Codes:        [facility] PT [place] 09/07/2016, 3:23 PM

## 2016-09-07 NOTE — Plan of Care (Signed)
Problem: Education: Goal: Knowledge of  General Education information/materials will improve Outcome: Progressing Discussed plan of care with family( daughter and wife). Patient transferred to [facility] from ICU. Patient given dose of haldol in ICU prior to transfer and patient deeply asleep. Explained to family that we would not oversedate the patient on night shift. They were agreeable to this plan.   Problem: Safety: Goal: Ability to remain free from injury will improve Outcome: Progressing Patient in four point restraints and has telesitter to keep him from getting out of bed. Patient was very agitated and trying to leave prior to arrival- patient has been sleeping during PM shift.   Problem: Physical Regulation: Goal: Ability to maintain clinical measurements within normal limits will improve Outcome: Progressing BP being monitored closely. Patient's automatic BP is not always accurate- manual pressures taken.

## 2016-09-07 NOTE — Progress Notes (Signed)
PROGRESS NOTE    Jeffrey Campbell  HQI:[id number] DOB: Feb 09, 1933 DOA: 09/04/2016 PCP: [name], MD   Brief Narrative:  83 y/o BM PMHx Carotid artery stenosis,Diastolic dysfunction; Dilated aortic root, Systolic Heart murmur,Diastolic dysfunction, HTN,Pulmonary hypertension , HTN, OSA, HLD,CKD stage III, DM type II  Who presents with confusion worsening over last week and severe hypertension. He has a normal MS at baseline, and was well at a recent appt in May. He does have refractory HTN (with RAS and other complicating issues) and does manage his own medications, so it is not known if he has been taking them at all. He does also have significant resting bradycardia, with a junctional rhythm with a rate of 42 at a recent clinic visit. In the ED, he was started on a nicardipine gtt to manage his BP and PCCM was asked to admit.   Subjective: 6/12 A/O 1 (does not know where, when, why), will follow commands. Pleasantly confused.   Assessment & Plan:   Active Problems:   Hypertensive emergency   Hypertensive emergency -Controlled -Amlodipine 10 mg daily -Cardura 6 mg daily -6/12 decrease Lasix 40 mg daily  Pulmonary venous HTN -Strict I&O since admission +8.3 L -Daily weight Filed Weights   09/06/16 0343 09/07/16 0500 09/07/16 0900  Weight: 126 lb 5.2 oz (57.3 kg) 123 lb 3.8 oz (55.9 kg) 119 lb 11.4 oz (54.3 kg)    Bradycardia -Continued bradycardia ensure patient not on notable blocking agent  Mild COPD/OSA -CPAP QHS  -Will hold inhalers here since he does not take at home anyway  Acute encephalopathy: QT is 0.37 -Avoid all sedating medication: DO NOT GIVE PATIENT BENZODIAZEPINE or Haldol if avoidable -Repeat EKG on 6/13 -Currently in 4-point strengths. Discontinue as patient's mental status improves  CKD Stage III (baseline Cr 2.5)  Follow urine output and Cr Lab Results  Component Value Date   CREATININE 2.93 (H) 09/07/2016   CREATININE 2.94 (H) 09/06/2016     CREATININE 2.61 (H) 09/05/2016  -Decrease Lasix to 40 mg daily  Hypothyroidism TSH is normal Continue synthyroid      DVT prophylaxis: Heparin subcutaneous Code Status: Full Family Communication: None Disposition Plan: Home on patient's cognition improved   Consultants:  [facility]   Procedures/Significant Events:  6/11 Echocardiogram Left ventricle: LVEF = 60% to 65%. - (grade 1 diastolic dysfunction). - Left atrium/Right atrium:  severely dilated. -Pulmonary arteries:  PA  peak pressure: 46 mm Hg (S).   VENTILATOR SETTINGS: None   Cultures 6/10 MRSA by PCR negative    Antimicrobials: Anti-infectives    None       Devices None   LINES / TUBES:  None    Continuous Infusions: . sodium chloride 250 mL (09/06/16 2015)  . niCARDipine Stopped (09/06/16 1100)     Objective: Vitals:   09/07/16 0627 09/07/16 0700 09/07/16 0740 09/07/16 0743  BP: 138/80 115/85  102/86  Pulse:  (!) 50 (!) 53 (!) 56  Resp:  16 15 16   Temp:   98.1 F (36.7 C)   TempSrc:   Axillary   SpO2:  97% 100% 100%  Weight:      Height:        Intake/Output Summary (Last 24 hours) at 09/07/16 0801 Last data filed at 09/07/16 0742  Gross per 24 hour  Intake           132.67 ml  Output             1270 ml  Net         -  1137.33 ml   Filed Weights   09/05/16 0357 09/06/16 0343 09/07/16 0500  Weight: 124 lb 5.4 oz (56.4 kg) 126 lb 5.2 oz (57.3 kg) 123 lb 3.8 oz (55.9 kg)    Examination:  General: A/O 1 (does not know where, when, why), No acute respiratory distress, cachectic Eyes: negative scleral hemorrhage, negative anisocoria, negative icterus ENT: Negative Runny nose, negative gingival bleeding, Neck:  Negative scars, masses, torticollis, lymphadenopathy, JVD Lungs: Clear to auscultation bilaterally without wheezes or crackles Cardiovascular: Regular rate and rhythm without murmur gallop or rub normal S1 and S2 Abdomen: negative abdominal pain, nondistended,  positive soft, bowel sounds, no rebound, no ascites, no appreciable mass Extremities: No significant cyanosis, clubbing, or edema bilateral lower extremities Skin: Negative rashes, lesions, ulcers Psychiatric:  Unable to fully evaluate patient extremely confused.   Central nervous system:  Cranial nerves II through XII intact, tongue/uvula midline, all extremities muscle strength 5/5, sensation intact throughout,  negative dysarthria, negative expressive aphasia, negative receptive aphasia.  .     Data Reviewed: Care during the described time interval was provided by me .  I have reviewed this patient's available data, including medical history, events of note, physical examination, and all test results as part of my evaluation. I have personally reviewed and interpreted all radiology studies.  CBC:  Recent Labs Lab 09/04/16 2039 09/05/16 0702 09/06/16 0230 09/07/16 0448  WBC 7.0 6.1 6.5 6.7  HGB 10.7* 10.6* 10.7* 11.0*  HCT 33.0* 32.9* 33.3* 34.3*  MCV 93.2 92.9 93.0 93.5  PLT 171 154 153 315*   Basic Metabolic Panel:  Recent Labs Lab 09/04/16 2039 09/05/16 0702 09/06/16 0230 09/07/16 0448  NA 140 143 142 143  K 4.4 4.0 3.9 3.8  CL 111 113* 111 110  CO2 22 23 22 24   GLUCOSE 109* 107* 104* 80  BUN 45* 45* 50* 51*  CREATININE 2.83* 2.61* 2.94* 2.93*  CALCIUM 9.8 9.7 9.4 10.0  MG  --  1.9 2.0 2.1  PHOS  --  3.2 3.7 4.4   GFR: Estimated Creatinine Clearance: 15.6 mL/min (A) (by C-G formula based on SCr of 2.93 mg/dL (H)). Liver Function Tests: No results for input(s): AST, ALT, ALKPHOS, BILITOT, PROT, ALBUMIN in the last 168 hours. No results for input(s): LIPASE, AMYLASE in the last 168 hours. No results for input(s): AMMONIA in the last 168 hours. Coagulation Profile: No results for input(s): INR, PROTIME in the last 168 hours. Cardiac Enzymes: No results for input(s): CKTOTAL, CKMB, CKMBINDEX, TROPONINI in the last 168 hours. BNP (last 3 results)  Recent  Labs  04/20/16 1135 04/28/16 1046  PROBNP 1,703* 1,296*   HbA1C: No results for input(s): HGBA1C in the last 72 hours. CBG:  Recent Labs Lab 09/04/16 2038  GLUCAP 103*   Lipid Profile: No results for input(s): CHOL, HDL, LDLCALC, TRIG, CHOLHDL, LDLDIRECT in the last 72 hours. Thyroid Function Tests:  Recent Labs  09/04/16 2225  TSH 0.602  FREET4 1.23*   Anemia Panel: No results for input(s): VITAMINB12, FOLATE, FERRITIN, TIBC, IRON, RETICCTPCT in the last 72 hours. Urine analysis:    Component Value Date/Time   COLORURINE YELLOW 09/04/2016 2031   APPEARANCEUR CLEAR 09/04/2016 2031   LABSPEC 1.023 09/04/2016 2031   PHURINE 5.0 09/04/2016 2031   GLUCOSEU NEGATIVE 09/04/2016 2031   HGBUR SMALL (A) 09/04/2016 2031   BILIRUBINUR NEGATIVE 09/04/2016 2031   [place] NEGATIVE 09/04/2016 2031   PROTEINUR 100 (A) 09/04/2016 2031   NITRITE NEGATIVE 09/04/2016 2031  LEUKOCYTESUR NEGATIVE 09/04/2016 2031   Sepsis Labs: @LABRCNTIP (procalcitonin:4,lacticidven:4)  ) Recent Results (from the past 240 hour(s))  MRSA PCR Screening     Status: None   Collection Time: 09/05/16  4:08 AM  Result Value Ref Range Status   MRSA by PCR NEGATIVE NEGATIVE Final    Comment:        The GeneXpert MRSA Assay (FDA approved for NASAL specimens only), is one component of a comprehensive MRSA colonization surveillance program. It is not intended to diagnose MRSA infection nor to guide or monitor treatment for MRSA infections.          Radiology Studies: Dg Chest Port 1 View  Result Date: 09/06/2016 CLINICAL DATA:  Respiratory failure EXAM: PORTABLE CHEST 1 VIEW COMPARISON:  09/04/2016 FINDINGS: Improvement in vascular congestion. Cardiac enlargement. Negative for edema or effusion. Mild left lower lobe airspace disease has developed since the prior study most likely atelectasis. IMPRESSION: Improving vascular congestion. Mild left lower lobe atelectasis. Electronically Signed    By: Franchot Gallo M.D.   On: 09/06/2016 07:00        Scheduled Meds: . amLODipine  10 mg Oral Daily   And  . atorvastatin  40 mg Oral Daily  . aspirin EC  81 mg Oral Daily  . doxazosin  6 mg Oral Daily  . feeding supplement (ENSURE ENLIVE)  237 mL Oral BID BM  . furosemide  60 mg Oral Daily  . heparin  5,000 Units Subcutaneous Q8H  . hydrALAZINE  100 mg Oral Q8H  . levothyroxine  137 mcg Oral QAC breakfast  . multivitamin with minerals  1 tablet Oral Daily   Continuous Infusions: . sodium chloride 250 mL (09/06/16 2015)  . niCARDipine Stopped (09/06/16 1100)     LOS: 2 days    Time spent: 40 minutes    Taym Twist, Geraldo Docker, MD [facility] Pager [phone]   If 7PM-7AM, please contact night-coverage [email or web address] Password [email or web address] 09/07/2016, 8:01 AM

## 2016-09-07 NOTE — Care Management Note (Signed)
Case Management Note  Patient Details  Name: Jeffrey Campbell MRN: [id number] Date of Birth: 05/26/1932  Subjective/Objective:  From home with wife who uses cane, presents with htn emergency and encephalopathy, being treated with nicardipine drip. .  Per pt eval rec SNF, CSW referral.                  Action/Plan: NCM will follow for dc needs.   Expected Discharge Date:                  Expected Discharge Plan:  Skilled Nursing Facility  In-House Referral:  Clinical Social Work  Discharge planning Services  CM Consult  Post Acute Care Choice:    Choice offered to:     DME Arranged:    DME Agency:     HH Arranged:    [place] Agency:     Status of Service:  Completed, signed off  If discussed at [employer], dates discussed:    Additional Comments:  Zenon Mayo, RN 09/07/2016, 5:06 PM

## 2016-09-08 DIAGNOSIS — I674 Hypertensive encephalopathy: Principal | ICD-10-CM

## 2016-09-08 MED ORDER — ISOSORBIDE DINITRATE 20 MG PO TABS
20.0000 mg | ORAL_TABLET | Freq: Three times a day (TID) | ORAL | Status: DC
  Administered 2016-09-08 (×2): 20 mg via ORAL
  Filled 2016-09-08 (×3): qty 1

## 2016-09-08 MED ORDER — TRAMADOL HCL 50 MG PO TABS
50.0000 mg | ORAL_TABLET | Freq: Four times a day (QID) | ORAL | Status: DC | PRN
  Filled 2016-09-08: qty 1

## 2016-09-08 MED ORDER — ACETAMINOPHEN 325 MG PO TABS
650.0000 mg | ORAL_TABLET | Freq: Four times a day (QID) | ORAL | Status: DC | PRN
  Administered 2016-09-08 – 2016-09-16 (×2): 650 mg via ORAL
  Filled 2016-09-08 (×2): qty 2

## 2016-09-08 NOTE — Plan of Care (Signed)
Problem: Physical Regulation: Goal: Ability to maintain clinical measurements within normal limits will improve Outcome: Progressing After addition of isordil patients BP has come down to within normal limits  Problem: Bowel/Gastric: Goal: Will not experience complications related to bowel motility Outcome: Progressing Patient had normal BM during shift and is consuming over 50% of all meals

## 2016-09-08 NOTE — Progress Notes (Signed)
[place] TEAM 1 - Stepdown/ICU TEAM  Jeffrey Campbell  UYQ:[id number] DOB: [birth year] DOA: 09/04/2016 PCP: [name], MD    Brief Narrative:  83 y/o M Hx Carotid artery stenosis, Diastolic dysfunction, Dilated aortic root, Systolic Heart murmur, HTN, Pulmonary hypertension, OSA, HLD, CKD stage III, and DM2 who presented with confusion worsening over a week w/ severe HTN.  In the ED, he was started on a nicardipine gtt to manage his BP and PCCM was asked to admit.  Significant Events: 6/9 admit by PCCM 6/11 TTE - EF 60-65 % - grade 1 diastolic dysfunction - severely dilated LA - PA peak pressure 46 mmHg 6/12 TRH assumed care  Subjective: The patient is alert and conversant.  Nonetheless he remains somewhat confused.  He clearly does not grasp why he is in the hospital or what is going home.  He does not appear to be in acute respiratory distress.  He denies any complaints himself.  Assessment & Plan:  Hypertensive emergency Blood pressure improving but remains significantly elevated - titrate medications further today and follow - though he is stabilizing overall we will need to keep in the step down unit in case a drip is required   Acute hypertensive encephalopathy Slowly improving but certainly not yet back to baseline  Pulmonary venous hypertension Has had an extensive workup via the CHF team - classified as group 2 related to elevated left heart pressures  Junctional Bradycardia This is a chronic problem for which the patient is followed by cardiology in the outpatient setting  CKD stage III Creatinine holding steady with range of 2.6-3.0  Hypothyroidism TSH at goal  COPD Well compensated at present  OSA Reportedly intolerant to CPAP  Severe malnutrition in context of chronic illness  DVT prophylaxis: Subcutaneous heparin Code Status: FULL CODE Family Communication: no family present at time of exam  Disposition Plan: PT/OT - keep in SDU additional night -  follow mental status   Consultants:  PCCM  Antimicrobials:  none  Objective: Blood pressure (!) 153/62, pulse 67, temperature 98.2 F (36.8 C), temperature source Oral, resp. rate 17, height 5\' 10"  (1.778 m), weight 57.2 kg (126 lb 1.7 oz), SpO2 97 %.  Intake/Output Summary (Last 24 hours) at 09/08/16 1604 Last data filed at 09/08/16 1200  Gross per 24 hour  Intake              240 ml  Output             1050 ml  Net             -810 ml   Filed Weights   09/07/16 0500 09/07/16 0900 09/08/16 0500  Weight: 55.9 kg (123 lb 3.8 oz) 54.3 kg (119 lb 11.4 oz) 57.2 kg (126 lb 1.7 oz)    Examination: General: No acute respiratory distress Lungs: Clear to auscultation bilaterally without wheezes or crackles Cardiovascular: Regular rate and rhythm without murmur gallop or rub normal S1 and S2 Abdomen: Nontender, nondistended, soft, bowel sounds positive, no rebound, no ascites, no appreciable mass Extremities: No significant cyanosis, clubbing, or edema bilateral lower extremities  CBC:  Recent Labs Lab 09/04/16 2039 09/05/16 0702 09/06/16 0230 09/07/16 0448  WBC 7.0 6.1 6.5 6.7  HGB 10.7* 10.6* 10.7* 11.0*  HCT 33.0* 32.9* 33.3* 34.3*  MCV 93.2 92.9 93.0 93.5  PLT 171 154 153 638*   Basic Metabolic Panel:  Recent Labs Lab 09/04/16 2039 09/05/16 0702 09/06/16 0230 09/07/16 0448  NA 140 143 142  143  K 4.4 4.0 3.9 3.8  CL 111 113* 111 110  CO2 22 23 22 24   GLUCOSE 109* 107* 104* 80  BUN 45* 45* 50* 51*  CREATININE 2.83* 2.61* 2.94* 2.93*  CALCIUM 9.8 9.7 9.4 10.0  MG  --  1.9 2.0 2.1  PHOS  --  3.2 3.7 4.4   GFR: Estimated Creatinine Clearance: 16 mL/min (A) (by C-G formula based on SCr of 2.93 mg/dL (H)).  Liver Function Tests: No results for input(s): AST, ALT, ALKPHOS, BILITOT, PROT, ALBUMIN in the last 168 hours. No results for input(s): LIPASE, AMYLASE in the last 168 hours. No results for input(s): AMMONIA in the last 168 hours.  CBG:  Recent  Labs Lab 09/04/16 2038  GLUCAP 103*    Recent Results (from the past 240 hour(s))  MRSA PCR Screening     Status: None   Collection Time: 09/05/16  4:08 AM  Result Value Ref Range Status   MRSA by PCR NEGATIVE NEGATIVE Final    Comment:        The GeneXpert MRSA Assay (FDA approved for NASAL specimens only), is one component of a comprehensive MRSA colonization surveillance program. It is not intended to diagnose MRSA infection nor to guide or monitor treatment for MRSA infections.      Scheduled Meds: . amLODipine  10 mg Oral Daily   And  . atorvastatin  40 mg Oral Daily  . aspirin EC  81 mg Oral Daily  . doxazosin  6 mg Oral Daily  . feeding supplement (ENSURE ENLIVE)  237 mL Oral BID BM  . furosemide  40 mg Oral Daily  . heparin  5,000 Units Subcutaneous Q8H  . hydrALAZINE  100 mg Oral Q8H  . levothyroxine  137 mcg Oral QAC breakfast  . multivitamin with minerals  1 tablet Oral Daily     LOS: 3 days   Cherene Altes, MD [facility] Office  [phone] Pager - Text Page per Shea Evans as per below:  On-Call/Text Page:      [email or web address]      password [email or web address]  If 7PM-7AM, please contact night-coverage [email or web address] Password [facility] 09/08/2016, 4:04 PM

## 2016-09-08 NOTE — Plan of Care (Signed)
Problem: Safety: Goal: Ability to remain free from injury will improve Outcome: Progressing Pt has progressed to allow for removal of BL ankle restraints. He verbally acknowledges that he understands that if he continues to refrain from pulling at and interfering with equipment then he can progress out of his BL wrist restraints today as well.  Minimal attempts at interference overnight, no attempts to get out of bed without assistance.

## 2016-09-09 DIAGNOSIS — F22 Delusional disorders: Secondary | ICD-10-CM

## 2016-09-09 DIAGNOSIS — J449 Chronic obstructive pulmonary disease, unspecified: Secondary | ICD-10-CM

## 2016-09-09 DIAGNOSIS — E039 Hypothyroidism, unspecified: Secondary | ICD-10-CM

## 2016-09-09 DIAGNOSIS — K7200 Acute and subacute hepatic failure without coma: Secondary | ICD-10-CM

## 2016-09-09 LAB — BASIC METABOLIC PANEL
Anion gap: 9 (ref 5–15)
BUN: 63 mg/dL — ABNORMAL HIGH (ref 6–20)
CO2: 24 mmol/L (ref 22–32)
CREATININE: 3.51 mg/dL — AB (ref 0.61–1.24)
Calcium: 9.7 mg/dL (ref 8.9–10.3)
Chloride: 100 mmol/L — ABNORMAL LOW (ref 101–111)
GFR calc non Af Amer: 15 mL/min — ABNORMAL LOW (ref >60)
GFR, EST AFRICAN AMERICAN: 17 mL/min — AB (ref >60)
GLUCOSE: 134 mg/dL — AB (ref 65–99)
Potassium: 4.1 mmol/L (ref 3.5–5.1)
Sodium: 133 mmol/L — ABNORMAL LOW (ref 135–145)

## 2016-09-09 LAB — CBC
HCT: 32.8 % — ABNORMAL LOW (ref 39.0–52.0)
Hemoglobin: 10.7 g/dL — ABNORMAL LOW (ref 13.0–17.0)
MCH: 30.1 pg (ref 26.0–34.0)
MCHC: 32.6 g/dL (ref 30.0–36.0)
MCV: 92.1 fL (ref 78.0–100.0)
PLATELETS: 142 10*3/uL — AB (ref 150–400)
RBC: 3.56 MIL/uL — ABNORMAL LOW (ref 4.22–5.81)
RDW: 14.6 % (ref 11.5–15.5)
WBC: 7.7 10*3/uL (ref 4.0–10.5)

## 2016-09-09 LAB — MAGNESIUM: Magnesium: 2.1 mg/dL (ref 1.7–2.4)

## 2016-09-09 MED ORDER — HALOPERIDOL LACTATE 5 MG/ML IJ SOLN
4.0000 mg | Freq: Four times a day (QID) | INTRAMUSCULAR | Status: DC
  Administered 2016-09-09 (×2): 4 mg via INTRAVENOUS
  Filled 2016-09-09 (×2): qty 1

## 2016-09-09 MED ORDER — HYDRALAZINE HCL 20 MG/ML IJ SOLN
20.0000 mg | Freq: Once | INTRAMUSCULAR | Status: AC
  Administered 2016-09-09: 20 mg via INTRAVENOUS
  Filled 2016-09-09: qty 1

## 2016-09-09 MED ORDER — ISOSORBIDE DINITRATE 20 MG PO TABS
40.0000 mg | ORAL_TABLET | Freq: Three times a day (TID) | ORAL | Status: DC
  Filled 2016-09-09: qty 2

## 2016-09-09 MED ORDER — NITROGLYCERIN IN D5W 200-5 MCG/ML-% IV SOLN
0.0000 ug/min | INTRAVENOUS | Status: DC
  Administered 2016-09-09: 50 ug/min via INTRAVENOUS
  Administered 2016-09-09: 25 ug/min via INTRAVENOUS
  Administered 2016-09-09: 35 ug/min via INTRAVENOUS
  Administered 2016-09-09: 45 ug/min via INTRAVENOUS
  Administered 2016-09-09: 5 ug/min via INTRAVENOUS
  Administered 2016-09-09: 30 ug/min via INTRAVENOUS
  Administered 2016-09-09: 40 ug/min via INTRAVENOUS
  Filled 2016-09-09: qty 250

## 2016-09-09 MED ORDER — HALOPERIDOL LACTATE 5 MG/ML IJ SOLN
2.0000 mg | Freq: Three times a day (TID) | INTRAMUSCULAR | Status: DC
  Administered 2016-09-09 (×2): 2 mg via INTRAVENOUS
  Filled 2016-09-09 (×2): qty 1

## 2016-09-09 MED ORDER — SODIUM CHLORIDE 0.9 % IV SOLN
INTRAVENOUS | Status: DC
  Administered 2016-09-09 – 2016-09-11 (×4): via INTRAVENOUS

## 2016-09-09 MED ORDER — FUROSEMIDE 10 MG/ML IJ SOLN
40.0000 mg | Freq: Every day | INTRAMUSCULAR | Status: DC
  Administered 2016-09-09: 40 mg via INTRAVENOUS
  Filled 2016-09-09: qty 4

## 2016-09-09 MED ORDER — HALOPERIDOL LACTATE 5 MG/ML IJ SOLN
INTRAMUSCULAR | Status: AC
  Filled 2016-09-09: qty 1

## 2016-09-09 NOTE — Plan of Care (Signed)
Problem: Safety: Goal: Ability to remain free from injury will improve Outcome: Progressing Pt has attempted to get out of bed several times during the night, however he was easily redirected by either nursing staff or telesitter prompts.

## 2016-09-09 NOTE — Care Management Important Message (Signed)
Important Message  Patient Details  Name: Jeffrey Campbell MRN: [id number] Date of Birth: November 05, 1932   Medicare Important Message Given:  Yes    Nathen May 09/09/2016, 9:44 AM

## 2016-09-09 NOTE — Progress Notes (Signed)
Physical Therapy Treatment Patient Details Name: Jeffrey Campbell MRN: [id number] DOB: 08-08-1932 Today's Date: 09/09/2016    History of Present Illness Pt adm with hypertensive emergency and acute encephalopathy. PMH - DM, CAD, CKD, etoh abuse, diastolic dysfunction    PT Comments    Pt making slow progress. Remains confused and with balance and mobility impairment. Continues to need assist with all mobility. Continue to recommend ST-SNF.   Follow Up Recommendations  SNF (unless confusion and mobility improve)     Equipment Recommendations  Other (comment) (To be assessed)    Recommendations for Other Services       Precautions / Restrictions Precautions Precautions: Fall Restrictions Weight Bearing Restrictions: No    Mobility  Bed Mobility Overal bed mobility: Needs Assistance Bed Mobility: Supine to Sit;Sit to Supine     Supine to sit: Min guard Sit to supine: Min guard   General bed mobility comments: Assist for safety  Transfers Overall transfer level: Needs assistance Equipment used: 1 person hand held assist Transfers: Sit to/from Stand Sit to Stand: Min assist         General transfer comment: Assist for balance. Posterior lean with legs propped against bed.  Ambulation/Gait Ambulation/Gait assistance: Min assist;+2 safety/equipment Ambulation Distance (Feet): 300 Feet Assistive device: 1 person hand held assist Gait Pattern/deviations: Step-through pattern;Decreased stride length;Drifts right/left;Staggering right;Staggering left Gait velocity: decr Gait velocity interpretation: Below normal speed for age/gender General Gait Details: Assist for balance and support. VSS stable.    Stairs            Wheelchair Mobility    Modified Rankin (Stroke Patients Only)       Balance Overall balance assessment: Needs assistance Sitting-balance support: No upper extremity supported;Feet supported Sitting balance-Leahy Scale: Fair     Standing  balance support: Single extremity supported Standing balance-Leahy Scale: Poor Standing balance comment: UE support and min A for static standing. Posterior lean initally                            Cognition Arousal/Alertness: Awake/alert Behavior During Therapy: WFL for tasks assessed/performed Overall Cognitive Status: Impaired/Different from baseline Area of Impairment: Orientation;Attention;Memory;Following commands;Safety/judgement;Problem solving                 Orientation Level: Disoriented to;Place;Time;Situation Current Attention Level: Sustained Memory: Decreased recall of precautions;Decreased short-term memory Following Commands: Follows one step commands consistently Safety/Judgement: Decreased awareness of safety;Decreased awareness of deficits   Problem Solving: Slow processing;Decreased initiation;Requires verbal cues;Requires tactile cues;Difficulty sequencing        Exercises      General Comments        Pertinent Vitals/Pain Pain Assessment: No/denies pain    Home Living                      Prior Function            PT Goals (current goals can now be found in the care plan section) Progress towards PT goals: Progressing toward goals    Frequency    Min 3X/week      PT Plan Current plan remains appropriate    Co-evaluation              AM-PAC PT "6 Clicks" Daily Activity  Outcome Measure  Difficulty turning over in bed (including adjusting bedclothes, sheets and blankets)?: A Little Difficulty moving from lying on back to sitting on the side of the bed? :  A Little Difficulty sitting down on and standing up from a chair with arms (e.g., wheelchair, bedside commode, etc,.)?: Total Help needed moving to and from a bed to chair (including a wheelchair)?: A Little Help needed walking in hospital room?: A Little Help needed climbing 3-5 steps with a railing? : A Little 6 Click Score: 16    End of Session  Equipment Utilized During Treatment: Gait belt Activity Tolerance: Patient tolerated treatment well Patient left: in bed;with bed alarm set (per nursing request for IV placement) Nurse Communication: Mobility status PT Visit Diagnosis: Unsteadiness on feet (R26.81);Muscle weakness (generalized) (M62.81)     Time: 6503-5465 PT Time Calculation (min) (ACUTE ONLY): 15 min  Charges:  $Gait Training: 8-22 mins                    G Codes:       [facility] PT [place] 09/09/2016, 1:34 PM

## 2016-09-09 NOTE — Progress Notes (Signed)
PROGRESS NOTE    Jeffrey Campbell  WFU:[id number] DOB: 07/06/1932 DOA: 09/04/2016 PCP: [name], MD   Brief Narrative:  83 y/o BM PMHx Carotid artery stenosis,Diastolic dysfunction; Dilated aortic root, Systolic Heart murmur,Diastolic dysfunction, HTN,Pulmonary hypertension , HTN, OSA, HLD,CKD stage III, DM type II  Who presents with confusion worsening over last week and severe hypertension. He has a normal MS at baseline, and was well at a recent appt in May. He does have refractory HTN (with RAS and other complicating issues) and does manage his own medications, so it is not known if he has been taking them at all. He does also have significant resting bradycardia, with a junctional rhythm with a rate of 42 at a recent clinic visit. In the ED, he was started on a nicardipine gtt to manage his BP and PCCM was asked to admit.   Subjective: 6/14  A/O 1 (does not know where, when, why), will follow commands. Patient extremely paranoid and believes that he's been kidnapped and being held against his will. Refusing all oral medication. Per RN Maggie wife attempted to reassure patient over the phone today but patient stated that she had been  Brain washed.    Assessment & Plan:   Active Problems:   Hypertensive emergency   Hypertensive emergency -Controlled -Hold Amlodipine 10 mg daily -Hold Cardura 6 mg daily -DC Lasix IV 40 mg daily worsening renal status -Hold Hydralazine 100 mg TID -Hold 6/14 Increase Isosorbide dinitrate 40 mg TID -6/14 start Nitroglycerin drip  Pulmonary venous HTN -Strict I&O since admission -1.1 L -Daily weight Filed Weights   09/07/16 0900 09/08/16 0500 09/09/16 0500  Weight: 119 lb 11.4 oz (54.3 kg) 126 lb 1.7 oz (57.2 kg) 127 lb 10.3 oz (57.9 kg)    Bradycardia -Continued bradycardia ensure patient not on Nodal blocking agent  OSA COPD overlap syndrome -CPAP QHS  -Will hold inhalers here since he does not take at home anyway  Acute  encephalopathy: QT is 0.37 -Avoid all sedating medication: DO NOT GIVE PATIENT BENZODIAZEPINE -Repeat EKG on 6/13 -Currently back in restraints   Paranoia/Psychosis/Hospital Delirium -Increasing uremia will gently hydrate -If patient's cognition/paranoia does not improve with improving uremia will obtain MRI brain -Haldol 4 mg QID  CKD Stage III (baseline Cr 2.5)  Follow urine output and Cr Lab Results  Component Value Date   CREATININE 3.51 (H) 09/09/2016   CREATININE 2.93 (H) 09/07/2016   CREATININE 2.94 (H) 09/06/2016  -DC Lasix -Avoid all nephrotoxic medication -Normal saline 50 ml/hr   Hypothyroidism -TSH is normal: Slightly elevated free T4 -Continue synthyroid      DVT prophylaxis: Heparin subcutaneous Code Status: Full Family Communication: None Disposition Plan: SNF vs Home if patient's cognition improves   Consultants:  PCCM   Procedures/Significant Events:  6/9 CT head W Wo contrast: Negative acute/chronic infarct 6/11 Echocardiogram Left ventricle: LVEF = 60% to 65%. - (grade 1 diastolic dysfunction). - Left atrium/Right atrium:  severely dilated. -Pulmonary arteries:  PA  peak pressure: 46 mm Hg (S).   VENTILATOR SETTINGS: None   Cultures 6/10 MRSA by PCR negative    Antimicrobials: Anti-infectives    None       Devices None   LINES / TUBES:  None    Continuous Infusions:    Objective: Vitals:   09/09/16 0500 09/09/16 0617 09/09/16 0700 09/09/16 0759  BP:  (!) 187/129 (!) 165/123 (!) 184/147  Pulse:   63 73  Resp:   14   Temp:  98.6 F (37 C)  TempSrc:    Oral  SpO2:   100% 100%  Weight: 127 lb 10.3 oz (57.9 kg)     Height:        Intake/Output Summary (Last 24 hours) at 09/09/16 0830 Last data filed at 09/09/16 0600  Gross per 24 hour  Intake              480 ml  Output              650 ml  Net             -170 ml   Filed Weights   09/07/16 0900 09/08/16 0500 09/09/16 0500  Weight: 119 lb 11.4 oz  (54.3 kg) 126 lb 1.7 oz (57.2 kg) 127 lb 10.3 oz (57.9 kg)    Examination:  General: A/O 1 (does not know where, when, why), Paranoid, No acute respiratory distress, cachectic Eyes: negative scleral hemorrhage, negative anisocoria, negative icterus ENT: Negative Runny nose, negative gingival bleeding, Neck:  Negative scars, masses, torticollis, lymphadenopathy, JVD Lungs: Clear to auscultation bilaterally without wheezes or crackles Cardiovascular: Regular rate and rhythm without murmur gallop or rub normal S1 and S2 Abdomen: negative abdominal pain, nondistended, positive soft, bowel sounds, no rebound, no ascites, no appreciable mass Extremities: No significant cyanosis, clubbing, or edema bilateral lower extremities Skin: Negative rashes, lesions, ulcers Psychiatric:  Paranoid, believes he has been kidnapped and being held against his will, patient extremely confused.   Central nervous system:  Cranial nerves II through XII intact, tongue/uvula midline, all extremities muscle strength 5/5, sensation intact throughout,  negative dysarthria, negative expressive aphasia, negative receptive aphasia.      Data Reviewed: Care during the described time interval was provided by me .  I have reviewed this patient's available data, including medical history, events of note, physical examination, and all test results as part of my evaluation. I have personally reviewed and interpreted all radiology studies.  CBC:  Recent Labs Lab 09/04/16 2039 09/05/16 0702 09/06/16 0230 09/07/16 0448  WBC 7.0 6.1 6.5 6.7  HGB 10.7* 10.6* 10.7* 11.0*  HCT 33.0* 32.9* 33.3* 34.3*  MCV 93.2 92.9 93.0 93.5  PLT 171 154 153 509*   Basic Metabolic Panel:  Recent Labs Lab 09/04/16 2039 09/05/16 0702 09/06/16 0230 09/07/16 0448  NA 140 143 142 143  K 4.4 4.0 3.9 3.8  CL 111 113* 111 110  CO2 22 23 22 24   GLUCOSE 109* 107* 104* 80  BUN 45* 45* 50* 51*  CREATININE 2.83* 2.61* 2.94* 2.93*  CALCIUM  9.8 9.7 9.4 10.0  MG  --  1.9 2.0 2.1  PHOS  --  3.2 3.7 4.4   GFR: Estimated Creatinine Clearance: 16.2 mL/min (A) (by C-G formula based on SCr of 2.93 mg/dL (H)). Liver Function Tests: No results for input(s): AST, ALT, ALKPHOS, BILITOT, PROT, ALBUMIN in the last 168 hours. No results for input(s): LIPASE, AMYLASE in the last 168 hours. No results for input(s): AMMONIA in the last 168 hours. Coagulation Profile: No results for input(s): INR, PROTIME in the last 168 hours. Cardiac Enzymes: No results for input(s): CKTOTAL, CKMB, CKMBINDEX, TROPONINI in the last 168 hours. BNP (last 3 results)  Recent Labs  04/20/16 1135 04/28/16 1046  PROBNP 1,703* 1,296*   HbA1C: No results for input(s): HGBA1C in the last 72 hours. CBG:  Recent Labs Lab 09/04/16 2038  GLUCAP 103*   Lipid Profile: No results for input(s): CHOL, HDL, LDLCALC, TRIG, CHOLHDL, LDLDIRECT  in the last 72 hours. Thyroid Function Tests: No results for input(s): TSH, T4TOTAL, FREET4, T3FREE, THYROIDAB in the last 72 hours. Anemia Panel: No results for input(s): VITAMINB12, FOLATE, FERRITIN, TIBC, IRON, RETICCTPCT in the last 72 hours. Urine analysis:    Component Value Date/Time   COLORURINE YELLOW 09/04/2016 2031   APPEARANCEUR CLEAR 09/04/2016 2031   LABSPEC 1.023 09/04/2016 2031   PHURINE 5.0 09/04/2016 2031   GLUCOSEU NEGATIVE 09/04/2016 2031   HGBUR SMALL (A) 09/04/2016 2031   BILIRUBINUR NEGATIVE 09/04/2016 2031   [place] NEGATIVE 09/04/2016 2031   PROTEINUR 100 (A) 09/04/2016 2031   NITRITE NEGATIVE 09/04/2016 2031   LEUKOCYTESUR NEGATIVE 09/04/2016 2031   Sepsis Labs: @LABRCNTIP (procalcitonin:4,lacticidven:4)  ) Recent Results (from the past 240 hour(s))  MRSA PCR Screening     Status: None   Collection Time: 09/05/16  4:08 AM  Result Value Ref Range Status   MRSA by PCR NEGATIVE NEGATIVE Final    Comment:        The GeneXpert MRSA Assay (FDA approved for NASAL specimens only), is one  component of a comprehensive MRSA colonization surveillance program. It is not intended to diagnose MRSA infection nor to guide or monitor treatment for MRSA infections.          Radiology Studies: No results found.      Scheduled Meds: . amLODipine  10 mg Oral Daily   And  . atorvastatin  40 mg Oral Daily  . aspirin EC  81 mg Oral Daily  . doxazosin  6 mg Oral Daily  . feeding supplement (ENSURE ENLIVE)  237 mL Oral BID BM  . furosemide  40 mg Oral Daily  . heparin  5,000 Units Subcutaneous Q8H  . hydrALAZINE  100 mg Oral Q8H  . isosorbide dinitrate  20 mg Oral TID  . levothyroxine  137 mcg Oral QAC breakfast  . multivitamin with minerals  1 tablet Oral Daily   Continuous Infusions:    LOS: 4 days    Time spent: 40 minutes    WOODS, Geraldo Docker, MD [facility] Pager [phone]   If 7PM-7AM, please contact night-coverage [email or web address] Password [email or web address] 09/09/2016, 8:30 AM

## 2016-09-09 NOTE — Progress Notes (Signed)
Pt has sitter at bedside. Sitter called RN to room due to patient grabbing the sitter and not staying in bed. MD paged, new order received for restraints. Pt able to get both legs completely out of bed with wrist restraints and soft belt on. Ankle restraints applied. Sitter at bedside and RN will continue to monitor.

## 2016-09-10 DIAGNOSIS — E876 Hypokalemia: Secondary | ICD-10-CM

## 2016-09-10 DIAGNOSIS — N19 Unspecified kidney failure: Secondary | ICD-10-CM

## 2016-09-10 LAB — MAGNESIUM: MAGNESIUM: 2.3 mg/dL (ref 1.7–2.4)

## 2016-09-10 LAB — BASIC METABOLIC PANEL
Anion gap: 12 (ref 5–15)
BUN: 65 mg/dL — ABNORMAL HIGH (ref 6–20)
CALCIUM: 9.5 mg/dL (ref 8.9–10.3)
CO2: 26 mmol/L (ref 22–32)
CREATININE: 3.23 mg/dL — AB (ref 0.61–1.24)
Chloride: 100 mmol/L — ABNORMAL LOW (ref 101–111)
GFR calc Af Amer: 19 mL/min — ABNORMAL LOW (ref >60)
GFR calc non Af Amer: 17 mL/min — ABNORMAL LOW (ref >60)
GLUCOSE: 102 mg/dL — AB (ref 65–99)
Potassium: 3.6 mmol/L (ref 3.5–5.1)
SODIUM: 138 mmol/L (ref 135–145)

## 2016-09-10 MED ORDER — DOXAZOSIN MESYLATE 4 MG PO TABS
6.0000 mg | ORAL_TABLET | Freq: Every day | ORAL | Status: DC
  Administered 2016-09-10: 6 mg via ORAL
  Filled 2016-09-10 (×2): qty 1

## 2016-09-10 MED ORDER — HYDRALAZINE HCL 50 MG PO TABS
100.0000 mg | ORAL_TABLET | Freq: Three times a day (TID) | ORAL | Status: DC
  Administered 2016-09-10 – 2016-09-19 (×24): 100 mg via ORAL
  Filled 2016-09-10 (×27): qty 2

## 2016-09-10 MED ORDER — ISOSORBIDE DINITRATE 20 MG PO TABS
40.0000 mg | ORAL_TABLET | Freq: Three times a day (TID) | ORAL | Status: DC
  Administered 2016-09-10 – 2016-09-11 (×5): 40 mg via ORAL
  Filled 2016-09-10 (×6): qty 2

## 2016-09-10 MED ORDER — NITROGLYCERIN IN D5W 200-5 MCG/ML-% IV SOLN
0.0000 ug/min | INTRAVENOUS | Status: DC
  Administered 2016-09-11: 15 ug/min via INTRAVENOUS
  Filled 2016-09-10 (×2): qty 250

## 2016-09-10 MED ORDER — POTASSIUM CHLORIDE CRYS ER 20 MEQ PO TBCR
40.0000 meq | EXTENDED_RELEASE_TABLET | Freq: Once | ORAL | Status: AC
  Administered 2016-09-10: 40 meq via ORAL
  Filled 2016-09-10: qty 2

## 2016-09-10 MED ORDER — HALOPERIDOL LACTATE 5 MG/ML IJ SOLN
3.0000 mg | Freq: Four times a day (QID) | INTRAMUSCULAR | Status: DC
  Administered 2016-09-11 (×2): 3 mg via INTRAVENOUS
  Filled 2016-09-10 (×3): qty 1

## 2016-09-10 MED ORDER — AMLODIPINE BESYLATE 10 MG PO TABS
10.0000 mg | ORAL_TABLET | Freq: Every day | ORAL | Status: DC
  Administered 2016-09-10 – 2016-09-18 (×8): 10 mg via ORAL
  Filled 2016-09-10 (×11): qty 1

## 2016-09-10 MED ORDER — POTASSIUM CHLORIDE 10 MEQ/100ML IV SOLN
10.0000 meq | INTRAVENOUS | Status: DC
  Filled 2016-09-10: qty 100

## 2016-09-10 NOTE — Progress Notes (Addendum)
PROGRESS NOTE    Jeffrey Campbell  NTI:[id number] DOB: 05/20/1932 DOA: 09/04/2016 PCP: [name], MD   Brief Narrative:  83 y/o BM PMHx Carotid artery stenosis,Diastolic dysfunction; Dilated aortic root, Systolic Heart murmur,Diastolic dysfunction, HTN,Pulmonary hypertension , HTN, OSA, HLD,CKD stage III, DM type II  Who presents with confusion worsening over last week and severe hypertension. He has a normal MS at baseline, and was well at a recent appt in May. He does have refractory HTN (with RAS and other complicating issues) and does manage his own medications, so it is not known if he has been taking them at all. He does also have significant resting bradycardia, with a junctional rhythm with a rate of 42 at a recent clinic visit. In the ED, he was started on a nicardipine gtt to manage his BP and PCCM was asked to admit.   Subjective: 6/15  A/O 1 (does not know where, when, why), will follow commands. Patient sleepy but pleasant. Paranoia appears to have resolved.     Assessment & Plan:   Active Problems:   Hypertensive emergency   Hypertensive Emergency -UnControlled -6/15 restart Amlodipine 10 mg daily -6/15 restart Cardura 6 mg daily -DC Lasix IV 40 mg daily worsening renal status -6/15 restart Hydralazine 100 mg TID -6/15 restart Isosorbide dinitrate 40 mg TID -6/15 after patient PO Nitroglycerin drip administered will titrate off.  Pulmonary venous HTN -Strict I&O since admission -1.4 L -Daily weight Filed Weights   09/08/16 0500 09/09/16 0500 09/10/16 0455  Weight: 126 lb 1.7 oz (57.2 kg) 127 lb 10.3 oz (57.9 kg) 126 lb 1.7 oz (57.2 kg)    Bradycardia -Continued bradycardia ensure patient not on Nodal blocking agent  OSA COPD overlap syndrome -CPAP QHS  -Will hold inhalers here since he does not take at home anyway  Acute encephalopathy: QT is 0.37 -Avoid all sedating medication: DO NOT GIVE PATIENT BENZODIAZEPINE -Currently back in restraints.     Uremic Encephalopathy Paranoia/Psychosis/Hospital Delirium -Continue to gently hydrate, monitor uremia. -If patient's cognition/paranoia does not improve with improving uremia will obtain MRI brain -Decrease Haldol 3 mg QID  CKD Stage III (baseline Cr 2.5)  Follow urine output and Cr Lab Results  Component Value Date   CREATININE 3.23 (H) 09/10/2016   CREATININE 3.51 (H) 09/09/2016   CREATININE 2.93 (H) 09/07/2016  -DC Lasix -Avoid all nephrotoxic medication -Continue Normal saline 50 ml/hr   Hypothyroidism -TSH is normal: Slightly elevated free T4 -Continue synthyroid  Hypokalemia -Potassium goal> 4 -Potassium 40 mEq      DVT prophylaxis: Heparin subcutaneous Code Status: Full Family Communication: None Disposition Plan: SNF vs Home if patient's cognition improves   Consultants:  PCCM   Procedures/Significant Events:  6/9 CT head W Wo contrast: Negative acute/chronic infarct 6/11 Echocardiogram Left ventricle: LVEF = 60% to 65%. - (grade 1 diastolic dysfunction). - Left atrium/Right atrium:  severely dilated. -Pulmonary arteries:  PA  peak pressure: 46 mm Hg (S).   VENTILATOR SETTINGS: None   Cultures 6/10 MRSA by PCR negative    Antimicrobials: Anti-infectives    None       Devices None   LINES / TUBES:  None    Continuous Infusions: . sodium chloride 50 mL/hr at 09/10/16 0141  . nitroGLYCERIN 50 mcg/min (09/09/16 2315)     Objective: Vitals:   09/09/16 2314 09/10/16 0000 09/10/16 0350 09/10/16 0455  BP: (!) 158/83 (!) 164/84 (!) 175/79   Pulse: 63 65 63   Resp: 17 20 15  Temp:  98 F (36.7 C) 98 F (36.7 C)   TempSrc:  Oral Oral   SpO2: 97% 98% 97%   Weight:    126 lb 1.7 oz (57.2 kg)  Height:        Intake/Output Summary (Last 24 hours) at 09/10/16 9449 Last data filed at 09/10/16 0400  Gross per 24 hour  Intake           924.01 ml  Output             1275 ml  Net          -350.99 ml   Filed Weights    09/08/16 0500 09/09/16 0500 09/10/16 0455  Weight: 126 lb 1.7 oz (57.2 kg) 127 lb 10.3 oz (57.9 kg) 126 lb 1.7 oz (57.2 kg)    Examination:  General: A/O 1 (does not know where, when, why), Pleasantly confused, No acute respiratory distress, cachectic Eyes: negative scleral hemorrhage, negative anisocoria, negative icterus ENT: Negative Runny nose, negative gingival bleeding, Neck:  Negative scars, masses, torticollis, lymphadenopathy, JVD Lungs: Clear to auscultation bilaterally without wheezes or crackles Cardiovascular: Regular rate and rhythm without murmur gallop or rub normal S1 and S2 Abdomen: negative abdominal pain, nondistended, positive soft, bowel sounds, no rebound, no ascites, no appreciable mass Extremities: No significant cyanosis, clubbing, or edema bilateral lower extremities Skin: Negative rashes, lesions, ulcers Psychiatric:  Paranoia has resolved, remains pleasantly confused. .   Central nervous system:  Cranial nerves II through XII intact, tongue/uvula midline, all extremities muscle strength 5/5, sensation intact throughout,  negative dysarthria, negative expressive aphasia, negative receptive aphasia.      Data Reviewed: Care during the described time interval was provided by me .  I have reviewed this patient's available data, including medical history, events of note, physical examination, and all test results as part of my evaluation. I have personally reviewed and interpreted all radiology studies.  CBC:  Recent Labs Lab 09/04/16 2039 09/05/16 0702 09/06/16 0230 09/07/16 0448 09/09/16 0950  WBC 7.0 6.1 6.5 6.7 7.7  HGB 10.7* 10.6* 10.7* 11.0* 10.7*  HCT 33.0* 32.9* 33.3* 34.3* 32.8*  MCV 93.2 92.9 93.0 93.5 92.1  PLT 171 154 153 148* 675*   Basic Metabolic Panel:  Recent Labs Lab 09/05/16 0702 09/06/16 0230 09/07/16 0448 09/09/16 0950 09/10/16 0445  NA 143 142 143 133* 138  K 4.0 3.9 3.8 4.1 3.6  CL 113* 111 110 100* 100*  CO2 23 22 24  24 26   GLUCOSE 916* 104* 80 134* 102*  BUN 45* 50* 51* 63* 65*  CREATININE 2.61* 2.94* 2.93* 3.51* 3.23*  CALCIUM 9.7 9.4 10.0 9.7 9.5  MG 1.9 2.0 2.1 2.1 2.3  PHOS 3.2 3.7 4.4  --   --    GFR: Estimated Creatinine Clearance: 14.5 mL/min (A) (by C-G formula based on SCr of 3.23 mg/dL (H)). Liver Function Tests: No results for input(s): AST, ALT, ALKPHOS, BILITOT, PROT, ALBUMIN in the last 168 hours. No results for input(s): LIPASE, AMYLASE in the last 168 hours. No results for input(s): AMMONIA in the last 168 hours. Coagulation Profile: No results for input(s): INR, PROTIME in the last 168 hours. Cardiac Enzymes: No results for input(s): CKTOTAL, CKMB, CKMBINDEX, TROPONINI in the last 168 hours. BNP (last 3 results)  Recent Labs  04/20/16 1135 04/28/16 1046  PROBNP 1,703* 1,296*   HbA1C: No results for input(s): HGBA1C in the last 72 hours. CBG:  Recent Labs Lab 09/04/16 2038  GLUCAP 103*  Lipid Profile: No results for input(s): CHOL, HDL, LDLCALC, TRIG, CHOLHDL, LDLDIRECT in the last 72 hours. Thyroid Function Tests: No results for input(s): TSH, T4TOTAL, FREET4, T3FREE, THYROIDAB in the last 72 hours. Anemia Panel: No results for input(s): VITAMINB12, FOLATE, FERRITIN, TIBC, IRON, RETICCTPCT in the last 72 hours. Urine analysis:    Component Value Date/Time   COLORURINE YELLOW 09/04/2016 2031   APPEARANCEUR CLEAR 09/04/2016 2031   LABSPEC 1.023 09/04/2016 2031   PHURINE 5.0 09/04/2016 2031   GLUCOSEU NEGATIVE 09/04/2016 2031   HGBUR SMALL (A) 09/04/2016 2031   BILIRUBINUR NEGATIVE 09/04/2016 2031   [place] NEGATIVE 09/04/2016 2031   PROTEINUR 100 (A) 09/04/2016 2031   NITRITE NEGATIVE 09/04/2016 2031   LEUKOCYTESUR NEGATIVE 09/04/2016 2031   Sepsis Labs: @[email or web address] (procalcitonin:4,lacticidven:4)  ) Recent Results (from the past 240 hour(s))  MRSA PCR Screening     Status: None   Collection Time: 09/05/16  4:08 AM  Result Value Ref Range Status    MRSA by PCR NEGATIVE NEGATIVE Final    Comment:        The GeneXpert MRSA Assay (FDA approved for NASAL specimens only), is one component of a comprehensive MRSA colonization surveillance program. It is not intended to diagnose MRSA infection nor to guide or monitor treatment for MRSA infections.          Radiology Studies: No results found.      Scheduled Meds: . aspirin EC  81 mg Oral Daily  . atorvastatin  40 mg Oral Daily  . feeding supplement (ENSURE ENLIVE)  237 mL Oral BID BM  . haloperidol lactate  4 mg Intravenous QID  . heparin  5,000 Units Subcutaneous Q8H  . levothyroxine  137 mcg Oral QAC breakfast  . multivitamin with minerals  1 tablet Oral Daily   Continuous Infusions: . sodium chloride 50 mL/hr at 09/10/16 0141  . nitroGLYCERIN 50 mcg/min (09/09/16 2315)     LOS: 5 days    Time spent: 40 minutes    Yaphet [place], Geraldo Docker, MD [facility] Pager [phone]   If 7PM-7AM, please contact night-coverage [email or web address] Password [email or web address] 09/10/2016, 7:09 AM

## 2016-09-11 DIAGNOSIS — I5032 Chronic diastolic (congestive) heart failure: Secondary | ICD-10-CM

## 2016-09-11 LAB — BASIC METABOLIC PANEL WITH GFR
Anion gap: 8 (ref 5–15)
BUN: 66 mg/dL — ABNORMAL HIGH (ref 6–20)
CO2: 25 mmol/L (ref 22–32)
Calcium: 9.2 mg/dL (ref 8.9–10.3)
Chloride: 103 mmol/L (ref 101–111)
Creatinine, Ser: 3 mg/dL — ABNORMAL HIGH (ref 0.61–1.24)
GFR calc Af Amer: 21 mL/min — ABNORMAL LOW (ref >60)
GFR calc non Af Amer: 18 mL/min — ABNORMAL LOW (ref >60)
Glucose, Bld: 106 mg/dL — ABNORMAL HIGH (ref 65–99)
Potassium: 4 mmol/L (ref 3.5–5.1)
Sodium: 136 mmol/L (ref 135–145)

## 2016-09-11 LAB — MAGNESIUM: Magnesium: 2.3 mg/dL (ref 1.7–2.4)

## 2016-09-11 MED ORDER — DOXAZOSIN MESYLATE 4 MG PO TABS
4.0000 mg | ORAL_TABLET | Freq: Every day | ORAL | Status: DC
  Filled 2016-09-11: qty 1

## 2016-09-11 MED ORDER — DOXAZOSIN MESYLATE 8 MG PO TABS
8.0000 mg | ORAL_TABLET | Freq: Every day | ORAL | Status: DC
  Administered 2016-09-11: 8 mg via ORAL
  Filled 2016-09-11: qty 1

## 2016-09-11 MED ORDER — LABETALOL HCL 200 MG PO TABS
300.0000 mg | ORAL_TABLET | Freq: Two times a day (BID) | ORAL | Status: DC
  Administered 2016-09-11: 300 mg via ORAL
  Filled 2016-09-11: qty 1

## 2016-09-11 MED ORDER — CLONIDINE HCL ER 0.1 MG PO TB12
0.1000 mg | ORAL_TABLET | Freq: Two times a day (BID) | ORAL | Status: DC
  Filled 2016-09-11: qty 1

## 2016-09-11 MED ORDER — CLONIDINE HCL 0.1 MG PO TABS
0.1000 mg | ORAL_TABLET | Freq: Two times a day (BID) | ORAL | Status: DC
  Administered 2016-09-11: 0.1 mg via ORAL
  Filled 2016-09-11: qty 1

## 2016-09-11 MED ORDER — LORAZEPAM 2 MG/ML IJ SOLN
2.0000 mg | Freq: Once | INTRAMUSCULAR | Status: AC
  Administered 2016-09-12: 2 mg via INTRAVENOUS
  Filled 2016-09-11: qty 1

## 2016-09-11 MED ORDER — HYDRALAZINE HCL 20 MG/ML IJ SOLN
10.0000 mg | INTRAMUSCULAR | Status: DC | PRN
  Administered 2016-09-12 – 2016-09-22 (×4): 10 mg via INTRAVENOUS
  Filled 2016-09-11 (×6): qty 1

## 2016-09-11 MED ORDER — CLONIDINE HCL 0.2 MG PO TABS
0.2000 mg | ORAL_TABLET | Freq: Two times a day (BID) | ORAL | Status: DC
  Administered 2016-09-11: 0.2 mg via ORAL
  Filled 2016-09-11: qty 1

## 2016-09-11 NOTE — Progress Notes (Signed)
Following commands , oriented to name and birthday, but confused of place. Thinks he is at home, constantly attempting to get up . Chair alarms on. Dr Sherral Hammers notified. No family at present. Md ordered waist belt for for safety. Will cont to monitor.

## 2016-09-11 NOTE — Progress Notes (Signed)
Patient had 1.74 sec pause. Patient has no complaints of SOB or chest pain. [place], MD notified. Will continue to monitor.

## 2016-09-11 NOTE — Progress Notes (Signed)
PROGRESS NOTE    Jeffrey Campbell  ZDG:[id number] DOB: [birth year] DOA: 09/04/2016 PCP: [name], MD   Brief Narrative:  83 y/o BM PMHx Carotid artery stenosis,Diastolic dysfunction; Dilated aortic root, Systolic Heart murmur,Diastolic dysfunction, HTN,Pulmonary hypertension , HTN, OSA, HLD,CKD stage III, DM type II  Who presents with confusion worsening over last week and severe hypertension. He has a normal MS at baseline, and was well at a recent appt in May. He does have refractory HTN (with RAS and other complicating issues) and does manage his own medications, so it is not known if he has been taking them at all. He does also have significant resting bradycardia, with a junctional rhythm with a rate of 42 at a recent clinic visit. In the ED, he was started on a nicardipine gtt to manage his BP and PCCM was asked to admit.   Subjective: 6/16  A/O  3 (does not know why), will follow commands, but still suspicious as to why he must remain in the hospital. Patient still paranoid and believes we are holding him against his will and would like to sign out AMA. .     Assessment & Plan:   Active Problems:   Hypertensive emergency   Hypertensive Emergency -UnControlled -Amlodipine 10 mg daily -6/16 increase  Cardura 8 mg daily -DC Lasix IV 40 mg daily worsening renal status -Hydralazine 100 mg TID -Isosorbide dinitrate 40 mg TID -6/16 Start Clonidine 0.1mg  BID -SBP goal 140-160. Continue to titrate nitroglycerin drip off  Chronic diastolic dysfunction/Pulmonary venous HTN -Strict I&O since admission - 58ml -Daily weight Filed Weights   09/09/16 0500 09/10/16 0455 09/11/16 0405  Weight: 127 lb 10.3 oz (57.9 kg) 126 lb 1.7 oz (57.2 kg) 123 lb 14.4 oz (56.2 kg)    Bradycardia -Continues to have some episodes of bradycardia but asymptomatic. Continue to hold  Nodal blocking agent  OSA COPD overlap syndrome -CPAP QHS  -Will hold inhalers here since he does not take at home  anyway  Acute encephalopathy: QT is 0.37 -Avoid all sedating medication: DO NOT GIVE PATIENT BENZODIAZEPINE -Currently back in restraints, Secondary to confusion/impulsiveness.    Uremic Encephalopathy Paranoia/Psychosis/Hospital Delirium -Continue to gently hydrate, monitor uremia. -Consulted Nephrology: Creatinine improving but BUN slightly worse. Given patient's emaciated state believe high BUN may be cause of/contributing to his encephalopathy. Will await recommendations. -If patient's cognition/paranoia does not improve with improving uremia will obtain MRI brain -Haldol 3 mg QID  CKD Stage III (baseline Cr 2.7)  Follow urine output and Cr Lab Results  Component Value Date   CREATININE 3.00 (H) 09/11/2016   CREATININE 3.23 (H) 09/10/2016   CREATININE 3.51 (H) 09/09/2016  -DC Lasix -Avoid all nephrotoxic medication -Normal saline 75 ml/hr -Spoke with nephrology Dr. Roney Jaffe, HD for uremia? He will evaluate and give recommendations   Hypothyroidism -TSH is normal: Slightly elevated free T4 -Continue synthyroid  Hypokalemia -Potassium goal> 4       DVT prophylaxis: Heparin subcutaneous Code Status: Full Family Communication: Daughter at bedside reviewed plan of care with her. Disposition Plan: SNF vs Home if patient's cognition improves   Consultants:  PCCM   Procedures/Significant Events:  6/9 CT head W Wo contrast: Negative acute/chronic infarct 6/11 Echocardiogram Left ventricle: LVEF = 60% to 65%. - (grade 1 diastolic dysfunction). - Left atrium/Right atrium:  severely dilated. -Pulmonary arteries:  PA  peak pressure: 46 mm Hg (S).   VENTILATOR SETTINGS: None   Cultures 6/10 MRSA by PCR negative  Antimicrobials: Anti-infectives    None       Devices None   LINES / TUBES:  None    Continuous Infusions: . sodium chloride 50 mL/hr at 09/11/16 0017  . nitroGLYCERIN 40 mcg/min (09/10/16 1745)     Objective: Vitals:    09/11/16 0300 09/11/16 0400 09/11/16 0405 09/11/16 0500  BP: (!) 155/74 (!) 164/74 (!) 164/74 (!) 159/72  Pulse: (!) 58 (!) 57 (!) 57 (!) 57  Resp: 16 14 14 14   Temp:   98.2 F (36.8 C)   TempSrc:   Oral   SpO2: 97% 94% 94% 96%  Weight:   123 lb 14.4 oz (56.2 kg)   Height:        Intake/Output Summary (Last 24 hours) at 09/11/16 0714 Last data filed at 09/11/16 0500  Gross per 24 hour  Intake          1485.58 ml  Output              575 ml  Net           910.58 ml   Filed Weights   09/09/16 0500 09/10/16 0455 09/11/16 0405  Weight: 127 lb 10.3 oz (57.9 kg) 126 lb 1.7 oz (57.2 kg) 123 lb 14.4 oz (56.2 kg)    Examination:  General: A/O  3 (does not know why), will follow commands,, No acute respiratory distress, cachectic Eyes: negative scleral hemorrhage, negative anisocoria, negative icterus ENT: Negative Runny nose, negative gingival bleeding, Neck:  Negative scars, masses, torticollis, lymphadenopathy, JVD Lungs: Clear to auscultation bilaterally without wheezes or crackles Cardiovascular: Regular rate and rhythm without murmur gallop or rub normal S1 and S2 Abdomen: negative abdominal pain, nondistended, positive soft, bowel sounds, no rebound, no ascites, no appreciable mass Extremities: No significant cyanosis, clubbing, or edema bilateral lower extremities Skin: Negative rashes, lesions, ulcers Psychiatric:  Paranoia, request to leave AMA, don't believe patient competent .   Central nervous system:  Cranial nerves II through XII intact, tongue/uvula midline, all extremities muscle strength 5/5, sensation intact throughout,  negative dysarthria, negative expressive aphasia, negative receptive aphasia.      Data Reviewed: Care during the described time interval was provided by me .  I have reviewed this patient's available data, including medical history, events of note, physical examination, and all test results as part of my evaluation. I have personally reviewed and  interpreted all radiology studies.  CBC:  Recent Labs Lab 09/04/16 2039 09/05/16 0702 09/06/16 0230 09/07/16 0448 09/09/16 0950  WBC 7.0 6.1 6.5 6.7 7.7  HGB 10.7* 10.6* 10.7* 11.0* 10.7*  HCT 33.0* 32.9* 33.3* 34.3* 32.8*  MCV 93.2 92.9 93.0 93.5 92.1  PLT 171 154 153 148* 354*   Basic Metabolic Panel:  Recent Labs Lab 09/05/16 0702 09/06/16 0230 09/07/16 0448 09/09/16 0950 09/10/16 0445 09/11/16 0304  NA 143 142 143 133* 138 136  K 4.0 3.9 3.8 4.1 3.6 4.0  CL 113* 111 110 100* 100* 103  CO2 23 22 24 24 26 25   GLUCOSE 107* 104* 80 134* 102* 106*  BUN 45* 50* 51* 63* 65* 66*  CREATININE 2.61* 2.94* 2.93* 3.51* 3.23* 3.00*  CALCIUM 9.7 9.4 10.0 9.7 9.5 9.2  MG 1.9 2.0 2.1 2.1 2.3 2.3  PHOS 3.2 3.7 4.4  --   --   --    GFR: Estimated Creatinine Clearance: 15.4 mL/min (A) (by C-G formula based on SCr of 3 mg/dL (H)). Liver Function Tests: No results for input(s): AST, ALT,  ALKPHOS, BILITOT, PROT, ALBUMIN in the last 168 hours. No results for input(s): LIPASE, AMYLASE in the last 168 hours. No results for input(s): AMMONIA in the last 168 hours. Coagulation Profile: No results for input(s): INR, PROTIME in the last 168 hours. Cardiac Enzymes: No results for input(s): CKTOTAL, CKMB, CKMBINDEX, TROPONINI in the last 168 hours. BNP (last 3 results)  Recent Labs  04/20/16 1135 04/28/16 1046  PROBNP 1,703* 1,296*   HbA1C: No results for input(s): HGBA1C in the last 72 hours. CBG:  Recent Labs Lab 09/04/16 2038  GLUCAP 103*   Lipid Profile: No results for input(s): CHOL, HDL, LDLCALC, TRIG, CHOLHDL, LDLDIRECT in the last 72 hours. Thyroid Function Tests: No results for input(s): TSH, T4TOTAL, FREET4, T3FREE, THYROIDAB in the last 72 hours. Anemia Panel: No results for input(s): VITAMINB12, FOLATE, FERRITIN, TIBC, IRON, RETICCTPCT in the last 72 hours. Urine analysis:    Component Value Date/Time   COLORURINE YELLOW 09/04/2016 2031   APPEARANCEUR CLEAR  09/04/2016 2031   LABSPEC 1.023 09/04/2016 2031   PHURINE 5.0 09/04/2016 2031   GLUCOSEU NEGATIVE 09/04/2016 2031   HGBUR SMALL (A) 09/04/2016 2031   BILIRUBINUR NEGATIVE 09/04/2016 2031   [place] NEGATIVE 09/04/2016 2031   PROTEINUR 100 (A) 09/04/2016 2031   NITRITE NEGATIVE 09/04/2016 2031   LEUKOCYTESUR NEGATIVE 09/04/2016 2031   Sepsis Labs: @LABRCNTIP (procalcitonin:4,lacticidven:4)  ) Recent Results (from the past 240 hour(s))  MRSA PCR Screening     Status: None   Collection Time: 09/05/16  4:08 AM  Result Value Ref Range Status   MRSA by PCR NEGATIVE NEGATIVE Final    Comment:        The GeneXpert MRSA Assay (FDA approved for NASAL specimens only), is one component of a comprehensive MRSA colonization surveillance program. It is not intended to diagnose MRSA infection nor to guide or monitor treatment for MRSA infections.          Radiology Studies: No results found.      Scheduled Meds: . amLODipine  10 mg Oral Daily  . aspirin EC  81 mg Oral Daily  . atorvastatin  40 mg Oral Daily  . doxazosin  6 mg Oral Daily  . feeding supplement (ENSURE ENLIVE)  237 mL Oral BID BM  . haloperidol lactate  3 mg Intravenous QID  . heparin  5,000 Units Subcutaneous Q8H  . hydrALAZINE  100 mg Oral TID  . isosorbide dinitrate  40 mg Oral TID  . levothyroxine  137 mcg Oral QAC breakfast  . multivitamin with minerals  1 tablet Oral Daily   Continuous Infusions: . sodium chloride 50 mL/hr at 09/11/16 0017  . nitroGLYCERIN 40 mcg/min (09/10/16 1745)     LOS: 6 days    Time spent: 40 minutes    Oris Staffieri, Geraldo Docker, MD [facility] Pager [phone]   If 7PM-7AM, please contact night-coverage [email or web address] Password [facility] 09/11/2016, 7:14 AM

## 2016-09-11 NOTE — Consult Note (Addendum)
Renal Service Consult Note [facility]  Jeffrey Campbell 09/11/2016 Roney Jaffe D Requesting Physician:  Dr Sherral Hammers  Reason for Consult:  CKD patient  HPI: The patient is a 83 y.o. year-old with history of CKD IV, CAD, Grave's disease, pulm HTN who presented to ED on 6/9 with one-week history of altered mental status; progressively worsening confusion over one week's time, in ED was only oriented to person, did not recognize his daughter in the room. That day he was noted to be trying to talk to his keys as a telephone. Not sleeping at night. No obvious visual hallucinations. Family noted he had no hx of dementia and was generally "sharp".  BP's in ED were high 270/ 110 (+hx of HTN and RAS w CKD) so pt was started on IV Cardene and admitted. BP's are still high but much better on oral meds now.  Creat on admission was 2.83 and has fluctuated from 2.8- 3.2, today creat is 3.0. He received lasix the 1st 4-5 days then creat bumped so lasix stopped and got IVF's the last 2-3 days. Is off Cardene but on IV NTG still.    We are asked to see pt for CKD.    Home meds : amlodipine 10/d w  atorvastatin, Cardura 8/d, Lasix 60 qd, hydralazine 100 tid, T4, MVI, KCl,  Current meds : amlod 10/d, asa, atorvastatin, clonidine 0.1 bid, cardura 8/d, Haldol 30m IV qid, hep sq tid, hydralazine 100 tid, Isordil 40 tid, MVI  Patient is a reasonable historian , better for remote history then recent events.  [place], then did 3 yrs in the [employer] [place] [employer].  [place] office.  [place] two children [place] his first wife and [place] 2[place]  Hx tobacco use.    Pt says [place].  He is confused about when he last saw him and goes off on tangents trying to remember.  He denies any N/V, loss of  appetite.      ROS  denies CP  no joint pain   no HA  no blurry vision  no rash  no diarrhea  no nausea/ vomiting  no dysuria  no difficulty voiding  no change in urine color    Past Medical History  Past Medical History:  Diagnosis Date  . BPH (benign prostatic hyperplasia)   . CAD (coronary artery disease)   . Carotid artery stenosis    1-39% bilateral carotid stenosis by dopplers 2018  . CKD (chronic kidney disease), stage III   . Diastolic dysfunction   . Dilated [place]    361mby echo 02/2015  . Glaucoma   . Graves disease   . Heart murmur, systolic   . History of ETOH abuse   . History of PFTs 05/2010   moderate airflow obstruction w reduced DLCO by PFT   . Hyperlipemia   . Hypertension   . Hyperthyroidism 08/26/10   radioactive iodine therapy   . Mitral regurgitation echo 2015   mild  . Multiple thyroid nodules   . MVP (mitral valve prolapse) 11/2012   posterior MVP  . Organic impotence   . OSA (obstructive sleep apnea)    upper airway resistance syndrome with RDI 18/hr - not  on CPAP due to insurance not covering  . PUD (peptic ulcer disease)   . Pulmonary hypertension ([place]) echo 2015   moderate PASP 49mHg  . Shutdown renal    after cardiac cath  . Type 2 diabetes, diet controlled ([place] [employer]   . Upper airway resistance syndrome    Past Surgical History  Past Surgical History:  Procedure Laterality Date  . APPENDECTOMY    . CARDIAC CATHETERIZATION    . CARDIAC CATHETERIZATION N/A 02/17/2015   Procedure: Right Heart Cath;  Surgeon: DLarey Dresser MD;  [facility];  Service: Cardiovascular;  Laterality: N/A;  . heart catherization    . HERNIA REPAIR  10/2009  . RIGHT HEART CATH N/A 07/28/2016   Procedure: Right Heart Cath;  Surgeon: DLarey Dresser MD;  [facility];  Service: Cardiovascular;  Laterality: N/A;   Family History  Family History  Problem Relation Age of Onset  . Kidney failure Father   .  Hypertension Father   . Colon cancer Mother   . Colon cancer Brother   . Lung disease Brother   . Colon cancer Maternal Uncle    Social History  reports that he quit smoking about 32 years ago. His smoking use included Cigarettes. He has a 80.00 pack-year smoking history. He has never used smokeless tobacco. He reports that he does not drink alcohol or use drugs. Allergies  Allergies  Allergen Reactions  . Tiotropium Bromide Monohydrate Other (See Comments)    Dry mouth  . Budesonide-Formoterol Fumarate     Dry mouth  . Other     DYE- Causes kidney to shut down  . Tricor [Fenofibrate]     Unknown   . Zocor [Simvastatin] Other (See Comments)    Not sure Liver problems    Home medications Prior to Admission medications   Medication Sig Start Date End Date Taking? Authorizing Provider  amLODipine-atorvastatin (CADUET) 10-40 MG tablet TAKE 1 TABLET BY MOUTH DAILY. Patient taking differently: Take 1 tablet by mouth at bedtime.  02/10/16  Yes TSueanne Margarita MD  aspirin EC 81 MG tablet Take 81 mg by mouth daily.   Yes [provider]  Brinzolamide-Brimonidine (SIMBRINZA) 1-0.2 % SUSP Place 1 drop into both eyes 2 (two) times daily.    Yes [provider]  doxazosin (CARDURA) 2 MG tablet TAKE 3 TABLETS BY MOUTH EVERY DAY Patient taking differently: Take 1 tablet (2 mg) by mouth 3 times daily 07/05/16  Yes AWellington Hampshire MD  furosemide (LASIX) 40 MG tablet Take 1.5 tablets (60 mg total) by mouth daily. Patient taking differently: Take 40 mg by mouth daily.  04/23/16  Yes LImogene Burn PA-C  hydrALAZINE (APRESOLINE) 100 MG tablet TAKE 1 TABLET (100 MG TOTAL) BY MOUTH 3 (THREE) TIMES DAILY. Patient taking differently: Take 100 mg by mouth 3 (three) times daily.  08/24/16  Yes Turner, TEber Hong MD  levothyroxine (SYNTHROID, LEVOTHROID) 137 MCG tablet Take 137 mcg by mouth daily before breakfast.  06/28/16  Yes [provider]  Multiple Vitamin (MULTIVITAMIN WITH  MINERALS) TABS tablet Take 1 tablet by mouth daily.   Yes [provider]  potassium chloride (K-DUR,KLOR-CON) 10 MEQ tablet Take 1 tablet (10 mEq total) by mouth daily. 09/19/15  Yes TSueanne Margarita MD   Liver Function Tests No results for input(s): AST, ALT, ALKPHOS, BILITOT, PROT, ALBUMIN in the last 168 hours. No results for input(s): LIPASE, AMYLASE in the last 168 hours. CBC  Recent Labs Lab 09/06/16 0230 09/07/16 0448 09/09/16 0950  WBC 6.5 6.7 7.7  HGB 10.7* 11.0* 10.7*  HCT 33.3* 34.3* 32.8*  MCV 93.0 93.5 92.1  PLT 153 148* 241*   Basic Metabolic Panel  Recent Labs Lab 09/04/16 2039 09/05/16 0702 09/06/16 0230 09/07/16 0448 09/09/16 0950 09/10/16 0445 09/11/16 0304  NA 140 143 142 143 133* 138 136  K 4.4 4.0 3.9 3.8 4.1 3.6 4.0  CL 111 113* 111 110 100* 100* 103  CO2 _0 GLUCOSE 109* 107* 104* 80 134* 102* 106*  BUN 45* 45* 50* 51* 63* 65* 66*  CREATININE 2.83* 2.61* 2.94* 2.93* 3.51* 3.23* 3.00*  CALCIUM 9.8 9.7 9.4 10.0 9.7 9.5 9.2  PHOS  --  3.2 3.7 4.4  --   --   --    Iron/TIBC/Ferritin/ %Sat No results found for: IRON, TIBC, FERRITIN, IRONPCTSAT  Vitals:   09/11/16 1238 09/11/16 1300 09/11/16 1330 09/11/16 1400  BP:  (!) 154/73 135/88 (!) 152/72  Pulse:  (!) 59 62 63  Resp:  _1 Temp: 97.9 F (36.6 C)     TempSrc: Oral     SpO2:  99% 99% 98%  Weight:      Height:       Exam Gen thin, elderly AAM, responds appropriately most of the time but with occassional lapse of memory or reason and goes off on a tangent No rash, cyanosis or gangrene Sclera anicteric, throat clear  No jvd or bruits Chest clear bilat RRR no MRG Abd soft ntnd no mass or ascites +bs, no scars or hsm GU normal male w condom cath MS no joint effusions or deformity Ext no LE or UE edema / no wounds or ulcers Neuro is alert, Ox 1, not sure year or date, no asterixis.     Assess: 1  CKD stage IV - eGFR here during this hosp stay is 17-  22 which is stage IV not V.  He has no N/V or loss of appetite.  He has no asterixis. Creat isn't much worse than it was in late 2017 at 2.6- 2.9. Patient's altered MS doesn't fit the typical presentation of uremia.  Will get 24 hr creat clearance but would look for other causes.  2  Altered mental status  3  HTN urgency - will make some BP med adjustments (add labetalol, dc nitrates and cardura, [address], wean off cardura)   Plan - as above  Kelly Splinter MD [employer] pager [phone]   09/11/2016, 5:50 PM

## 2016-09-12 ENCOUNTER — Inpatient Hospital Stay (HOSPITAL_COMMUNITY): Payer: Medicare Other

## 2016-09-12 DIAGNOSIS — F0150 Vascular dementia without behavioral disturbance: Secondary | ICD-10-CM

## 2016-09-12 DIAGNOSIS — R630 Anorexia: Secondary | ICD-10-CM

## 2016-09-12 LAB — BASIC METABOLIC PANEL
Anion gap: 7 (ref 5–15)
BUN: 60 mg/dL — AB (ref 6–20)
CHLORIDE: 104 mmol/L (ref 101–111)
CO2: 24 mmol/L (ref 22–32)
CREATININE: 2.91 mg/dL — AB (ref 0.61–1.24)
Calcium: 9.2 mg/dL (ref 8.9–10.3)
GFR calc non Af Amer: 19 mL/min — ABNORMAL LOW (ref >60)
GFR, EST AFRICAN AMERICAN: 22 mL/min — AB (ref >60)
Glucose, Bld: 100 mg/dL — ABNORMAL HIGH (ref 65–99)
Potassium: 4.2 mmol/L (ref 3.5–5.1)
Sodium: 135 mmol/L (ref 135–145)

## 2016-09-12 LAB — GLUCOSE, CAPILLARY: GLUCOSE-CAPILLARY: 157 mg/dL — AB (ref 65–99)

## 2016-09-12 LAB — MAGNESIUM: Magnesium: 2.4 mg/dL (ref 1.7–2.4)

## 2016-09-12 MED ORDER — DEXTROSE-NACL 5-0.9 % IV SOLN
INTRAVENOUS | Status: DC
  Administered 2016-09-12 – 2016-09-13 (×2): via INTRAVENOUS

## 2016-09-12 MED ORDER — CLONIDINE HCL 0.3 MG PO TABS
0.3000 mg | ORAL_TABLET | Freq: Two times a day (BID) | ORAL | Status: DC
  Filled 2016-09-12: qty 1

## 2016-09-12 MED ORDER — LISINOPRIL 10 MG PO TABS
10.0000 mg | ORAL_TABLET | Freq: Two times a day (BID) | ORAL | Status: DC
  Administered 2016-09-12 – 2016-09-13 (×3): 10 mg via ORAL
  Filled 2016-09-12 (×4): qty 1

## 2016-09-12 MED ORDER — HYDRALAZINE HCL 20 MG/ML IJ SOLN
15.0000 mg | Freq: Once | INTRAMUSCULAR | Status: AC
  Administered 2016-09-12: 15 mg via INTRAVENOUS

## 2016-09-12 MED ORDER — HALOPERIDOL LACTATE 5 MG/ML IJ SOLN
2.0000 mg | Freq: Four times a day (QID) | INTRAMUSCULAR | Status: DC
  Administered 2016-09-12 (×2): 2 mg via INTRAVENOUS
  Filled 2016-09-12 (×3): qty 1

## 2016-09-12 MED ORDER — CLONIDINE HCL 0.3 MG PO TABS: 0.3000 mg | ORAL_TABLET | Freq: Two times a day (BID) | ORAL | Status: DC

## 2016-09-12 MED ORDER — BRINZOLAMIDE 1 % OP SUSP
1.0000 [drp] | Freq: Two times a day (BID) | OPHTHALMIC | Status: DC
  Administered 2016-09-12 – 2016-09-24 (×23): 1 [drp] via OPHTHALMIC
  Filled 2016-09-12 (×2): qty 10

## 2016-09-12 MED ORDER — DOXAZOSIN MESYLATE 8 MG PO TABS: 8.0000 mg | ORAL_TABLET | Freq: Every day | ORAL | Status: DC

## 2016-09-12 MED ORDER — DOXAZOSIN MESYLATE 8 MG PO TABS
8.0000 mg | ORAL_TABLET | Freq: Every day | ORAL | Status: DC
  Administered 2016-09-13 – 2016-09-19 (×7): 8 mg via ORAL
  Filled 2016-09-12 (×8): qty 1

## 2016-09-12 MED ORDER — BRIMONIDINE TARTRATE 0.2 % OP SOLN
1.0000 [drp] | Freq: Two times a day (BID) | OPHTHALMIC | Status: DC
  Administered 2016-09-12 – 2016-09-24 (×23): 1 [drp] via OPHTHALMIC
  Filled 2016-09-12 (×2): qty 5

## 2016-09-12 MED ORDER — CLONIDINE HCL 0.3 MG PO TABS
0.3000 mg | ORAL_TABLET | Freq: Two times a day (BID) | ORAL | Status: DC
  Administered 2016-09-12 (×2): 0.3 mg via ORAL
  Filled 2016-09-12: qty 1

## 2016-09-12 NOTE — Progress Notes (Addendum)
PROGRESS NOTE    Jeffrey Campbell  JGG:[id number] DOB: 03/14/1933 DOA: 09/04/2016 PCP: [name], MD   Brief Narrative:  83 y/o BM PMHx Carotid artery stenosis,Diastolic dysfunction; Dilated aortic root, Systolic Heart murmur,Diastolic dysfunction, HTN,Pulmonary hypertension , HTN, OSA, HLD,CKD stage III, DM type II  Who presents with confusion worsening over last week and severe hypertension. He has a normal MS at baseline, and was well at a recent appt in May. He does have refractory HTN (with RAS and other complicating issues) and does manage his own medications, so it is not known if he has been taking them at all. He does also have significant resting bradycardia, with a junctional rhythm with a rate of 42 at a recent clinic visit. In the ED, he was started on a nicardipine gtt to manage his BP and PCCM was asked to admit.   Subjective: 6/17 patient sleeping this a.m. after his MRI. Received Ativan prior to MRI. Family states that patient was having mild confusion starting~May 58. Would be confused on which Keys to use  for his car, which building he was supposed to report to for a physician's appointment...etc   Assessment & Plan:   Active Problems:   Hypertensive emergency   Hypertensive Emergency -UnControlled -Amlodipine 10 mg daily -6/17 increase  Cardura 8 mg daily -Continue to hold Lasix IV 40 mg daily worsening renal status -Hydralazine 100 mg TID -DC Isosorbide dinitrate 40 mg TID per nephrology recommendation -6/17 increase  Clonidine 0.3mg  BID -6/17 start low-dose lisinopril 10 mg BID. Monitor creatinine closely. -DC labetalol, secondary to bradycardia -SBP goal 150-170. Per nephrology recommendation  Chronic diastolic CHF /Pulmonary venous HTN -Strict I&O since admission - 625ml -Daily weight Filed Weights   09/10/16 0455 09/11/16 0405 09/12/16 0228  Weight: 126 lb 1.7 oz (57.2 kg) 123 lb 14.4 oz (56.2 kg) 126 lb 5.2 oz (57.3 kg)     Bradycardia -Continues to have some episodes of bradycardia but asymptomatic. Continue to hold  Nodal blocking agent  OSA COPD overlap syndrome -CPAP QHS  -Will hold inhalers here since he does not take at home anyway  Acute encephalopathy: QT is 0.37 -Avoid all sedating medication: DO NOT GIVE PATIENT BENZODIAZEPINE  Uremic Encephalopathy Paranoia/Psychosis/Hospital Delirium -Continue to gently hydrate, monitor uremia. -Consulted Nephrology: Creatinine improving but BUN slightly worse. Given patient's emaciated state believe high BUN may be cause of/contributing to his encephalopathy. Will await recommendations. NOTE nephrology does not believe uremia cause of patient's altered metal status. -6/17 decrease Haldol 2 mg QID  -MRI negative for acute CVA   Dementia? -After speaking with family on 6/17 appears patient may have mild dementia prior to admission. -Consider starting medication, will discuss with wife and daughter. May slow progression.  CKD Stage 4 (baseline Cr 2.7)  Follow urine output and Cr Lab Results  Component Value Date   CREATININE 2.91 (H) 09/12/2016   CREATININE 3.00 (H) 09/11/2016   CREATININE 3.23 (H) 09/10/2016  -DC Lasix -Avoid all nephrotoxic medication -Spoke with nephrology Dr. Roney Jaffe, no HD for uremia. BP medication recommendations as above.   Hypothyroidism -TSH is normal: Slightly elevated free T4 -Continue synthyroid  Hypokalemia -Potassium goal> 4  Anorexia  -Patient still not eating/drinking well. Start D5-0.9% saline @ 19ml/hr   Goals of care -6/17 spoke at length with Ms. [name] Costanza (wife) and our concerns about her daughter interaction with the staff. Ms. Urbanek felt that we were doing a superb job caring for her husband, did not want any  staff changes. In addition she stated she would speak with her daughter.She also stated she preferred that we not relate any further information to her daughter and that the staff  speak with her directly regarding care for her husband. ADDENDUM: After having family meeting this A.m., Mrs. Stilley agreed to allow staff to also speak with her daughter [name].     DVT prophylaxis: Heparin subcutaneous Code Status: Full Family Communication: Daughter at bedside reviewed plan of care with her. Disposition Plan: SNF vs Home if patient's cognition improves   Consultants:  PCCM   Procedures/Significant Events:  6/9 CT head W Wo contrast: Negative acute/chronic infarct 6/11 Echocardiogram Left ventricle: LVEF = 60% to 65%. - (grade 1 diastolic dysfunction). - Left atrium/Right atrium:  severely dilated. -Pulmonary arteries:  PA  peak pressure: 46 mm Hg (S). 6/16 MRI Brain: -Negative acute CVA -beginning confluent hyperintense T2-weighted signal within the periventricular white matter, most often seen in the setting of chronic microvascular ischemia.   VENTILATOR SETTINGS: None   Cultures 6/10 MRSA by PCR negative    Antimicrobials: Anti-infectives    None       Devices None   LINES / TUBES:  None    Continuous Infusions:    Objective: Vitals:   09/12/16 0000 09/12/16 0100 09/12/16 0228 09/12/16 0406  BP: (!) 173/69 (!) 164/59  (!) 182/59  Pulse: (!) 48 (!) 43  (!) 44  Resp: 15 11  15   Temp:    98.5 F (36.9 C)  TempSrc:    Axillary  SpO2: 96% 98%  93%  Weight:   126 lb 5.2 oz (57.3 kg)   Height:        Intake/Output Summary (Last 24 hours) at 09/12/16 0650 Last data filed at 09/12/16 0423  Gross per 24 hour  Intake           728.13 ml  Output              775 ml  Net           -46.87 ml   Filed Weights   09/10/16 0455 09/11/16 0405 09/12/16 0228  Weight: 126 lb 1.7 oz (57.2 kg) 123 lb 14.4 oz (56.2 kg) 126 lb 5.2 oz (57.3 kg)    Examination:  General: sleeping this a.m. after his MRI. Received Ativan prior to MRI.,, No acute respiratory distress, cachectic Eyes: negative scleral hemorrhage, negative anisocoria,  negative icterus ENT: Negative Runny nose, negative gingival bleeding, Neck:  Negative scars, masses, torticollis, lymphadenopathy, JVD Lungs: Clear to auscultation bilaterally without wheezes or crackles Cardiovascular: Regular rate and rhythm without murmur gallop or rub normal S1 and S2 Abdomen: negative abdominal pain, nondistended, positive soft, bowel sounds, no rebound, no ascites, no appreciable mass Extremities: No significant cyanosis, clubbing, or edema bilateral lower extremities Skin: Negative rashes, lesions, ulcers Psychiatric:  Unable to evaluate secondary to patient being asleep secondary to medication   Central nervous system:  Unable to evaluate secondary to patient being asleep secondary to medication.      Data Reviewed: Care during the described time interval was provided by me .  I have reviewed this patient's available data, including medical history, events of note, physical examination, and all test results as part of my evaluation. I have personally reviewed and interpreted all radiology studies.  CBC:  Recent Labs Lab 09/05/16 0702 09/06/16 0230 09/07/16 0448 09/09/16 0950  WBC 6.1 6.5 6.7 7.7  HGB 10.6* 10.7* 11.0* 10.7*  HCT 32.9* 33.3* 34.3* 32.8*  MCV 92.9 93.0 93.5 92.1  PLT 154 153 148* 732*   Basic Metabolic Panel:  Recent Labs Lab 09/05/16 0702 09/06/16 0230 09/07/16 0448 09/09/16 0950 09/10/16 0445 09/11/16 0304 09/12/16 0320  NA 143 142 143 133* 138 136 135  K 4.0 3.9 3.8 4.1 3.6 4.0 4.2  CL 113* 111 110 100* 100* 103 104  CO2 23 22 24 24 26 25 24   GLUCOSE 107* 104* 80 134* 102* 106* 100*  BUN 45* 50* 51* 63* 65* 66* 60*  CREATININE 2.61* 2.94* 2.93* 3.51* 3.23* 3.00* 2.91*  CALCIUM 9.7 9.4 10.0 9.7 9.5 9.2 9.2  MG 1.9 2.0 2.1 2.1 2.3 2.3 2.4  PHOS 3.2 3.7 4.4  --   --   --   --    GFR: Estimated Creatinine Clearance: 16.1 mL/min (A) (by C-G formula based on SCr of 2.91 mg/dL (H)). Liver Function Tests: No results for  input(s): AST, ALT, ALKPHOS, BILITOT, PROT, ALBUMIN in the last 168 hours. No results for input(s): LIPASE, AMYLASE in the last 168 hours. No results for input(s): AMMONIA in the last 168 hours. Coagulation Profile: No results for input(s): INR, PROTIME in the last 168 hours. Cardiac Enzymes: No results for input(s): CKTOTAL, CKMB, CKMBINDEX, TROPONINI in the last 168 hours. BNP (last 3 results)  Recent Labs  04/20/16 1135 04/28/16 1046  PROBNP 1,703* 1,296*   HbA1C: No results for input(s): HGBA1C in the last 72 hours. CBG: No results for input(s): GLUCAP in the last 168 hours. Lipid Profile: No results for input(s): CHOL, HDL, LDLCALC, TRIG, CHOLHDL, LDLDIRECT in the last 72 hours. Thyroid Function Tests: No results for input(s): TSH, T4TOTAL, FREET4, T3FREE, THYROIDAB in the last 72 hours. Anemia Panel: No results for input(s): VITAMINB12, FOLATE, FERRITIN, TIBC, IRON, RETICCTPCT in the last 72 hours. Urine analysis:    Component Value Date/Time   COLORURINE YELLOW 09/04/2016 2031   APPEARANCEUR CLEAR 09/04/2016 2031   LABSPEC 1.023 09/04/2016 2031   PHURINE 5.0 09/04/2016 2031   GLUCOSEU NEGATIVE 09/04/2016 2031   HGBUR SMALL (A) 09/04/2016 2031   BILIRUBINUR NEGATIVE 09/04/2016 2031   [place] NEGATIVE 09/04/2016 2031   PROTEINUR 100 (A) 09/04/2016 2031   NITRITE NEGATIVE 09/04/2016 2031   LEUKOCYTESUR NEGATIVE 09/04/2016 2031   Sepsis Labs: @LABRCNTIP (procalcitonin:4,lacticidven:4)  ) Recent Results (from the past 240 hour(s))  MRSA PCR Screening     Status: None   Collection Time: 09/05/16  4:08 AM  Result Value Ref Range Status   MRSA by PCR NEGATIVE NEGATIVE Final    Comment:        The GeneXpert MRSA Assay (FDA approved for NASAL specimens only), is one component of a comprehensive MRSA colonization surveillance program. It is not intended to diagnose MRSA infection nor to guide or monitor treatment for MRSA infections.          Radiology  Studies: Mr Brain Wo Contrast  Result Date: 09/12/2016 CLINICAL DATA:  Altered mental status EXAM: MRI HEAD WITHOUT CONTRAST TECHNIQUE: Multiplanar, multiecho pulse sequences of the brain and surrounding structures were obtained without intravenous contrast. COMPARISON:  CT brain 09/04/2016 FINDINGS: Brain: The midline structures are normal. There is no focal diffusion restriction to indicate acute infarct. There is beginning confluent hyperintense T2-weighted signal within the periventricular white matter, most often seen in the setting of chronic microvascular ischemia. No intraparenchymal hematoma or chronic microhemorrhage. Brain volume is normal for age without age-advanced or lobar predominant atrophy. The dura is normal and there is no extra-axial collection. Vascular:  Major intracranial arterial and venous sinus flow voids are preserved. Skull and upper cervical spine: The visualized skull base, calvarium, upper cervical spine and extracranial soft tissues are normal. Sinuses/Orbits: No fluid levels or advanced mucosal thickening. No mastoid effusion. 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 IMPRESSION: 1. No acute intracranial abnormality. 2. Advanced white matter disease, most commonly chronic microvascular ischemia. 3. Bilateral exophthalmos, in keeping with thyroid ophthalmopathy. Electronically Signed   By: Ulyses Jarred M.D.   On: 09/12/2016 02:47        Scheduled Meds: . amLODipine  10 mg Oral Daily  . aspirin EC  81 mg Oral Daily  . atorvastatin  40 mg Oral Daily  . cloNIDine  0.2 mg Oral BID  . doxazosin  4 mg Oral Daily  . feeding supplement (ENSURE ENLIVE)  237 mL Oral BID BM  . haloperidol lactate  3 mg Intravenous QID  . heparin  5,000 Units Subcutaneous Q8H  . hydrALAZINE  100 mg Oral TID  . labetalol  300 mg Oral BID  . levothyroxine  137 mcg Oral QAC breakfast  . multivitamin with minerals  1 tablet Oral Daily   Continuous  Infusions:    LOS: 7 days    Time spent: 40 minutes    WOODS, Geraldo Docker, MD [facility] Pager [phone]   If 7PM-7AM, please contact night-coverage [email or web address] Password [email or web address] 09/12/2016, 6:50 AM

## 2016-09-12 NOTE — Progress Notes (Signed)
[facility] Progress Note   Subjective: no new c/o  Vitals:   09/12/16 0100 09/12/16 0228 09/12/16 0406 09/12/16 0932  BP: (!) 164/59  (!) 182/59 (!) 188/58  Pulse: (!) 43  (!) 44   Resp: 11  15   Temp:   98.5 F (36.9 C) 97.8 F (36.6 C)  TempSrc:   Axillary Oral  SpO2: 98%  93%   Weight:  57.3 kg (126 lb 5.2 oz)    Height:        Inpatient medications: . amLODipine  10 mg Oral Daily  . aspirin EC  81 mg Oral Daily  . atorvastatin  40 mg Oral Daily  . brimonidine  1 drop Both Eyes BID  . brinzolamide  1 drop Both Eyes BID  . cloNIDine  0.3 mg Oral BID  . [START ON 09/13/2016] doxazosin  8 mg Oral Daily  . feeding supplement (ENSURE ENLIVE)  237 mL Oral BID BM  . haloperidol lactate  2 mg Intravenous QID  . heparin  5,000 Units Subcutaneous Q8H  . hydrALAZINE  100 mg Oral TID  . levothyroxine  137 mcg Oral QAC breakfast  . lisinopril  10 mg Oral BID  . multivitamin with minerals  1 tablet Oral Daily    acetaminophen, hydrALAZINE, traMADol  Exam: Gen thin, elderly AAM, responds appropriately most of the time but with occassional lapse of memory or reason and goes off on a tangents No rash, cyanosis or gangrene Sclera anicteric, throat clear  No jvd or bruits Chest clear bilat RRR no MRG Abd soft ntnd no mass or ascites +bs, no scars or hsm GU normal male w condom cath MS no joint effusions or deformity Ext no LE or UE edema / no wounds or ulcers Neuro is alert, Ox 1, not sure year or date, no asterixis.     Assess: 1  CKD stage IV - eGFR around 17- 20. 24 hr urine clearance pending. F/B Dr Justin Mend.  2  AMS - psych vs dementia vs other. Doubt uremia w/ renal function as it is.  3  HTN urgency - d/w primary, add lisinopril while he is in hospital. Can't take BB with bradycardia. Don't over-treat BP as the DBP is relatively low, goal SBP 140- 170 for now 4  Hx CAD  Plan - as above   Kelly Splinter MD [facility] pager  [phone]   09/12/2016, 11:09 AM    Recent Labs Lab 09/06/16 0230 09/07/16 0448  09/10/16 0445 09/11/16 0304 09/12/16 0320  NA 142 143  < > 138 136 135  K 3.9 3.8  < > 3.6 4.0 4.2  CL 111 110  < > 100* 103 104  CO2 22 24  < > '26 25 24  ' GLUCOSE 104* 80  < > 102* 106* 100*  BUN 50* 51*  < > 65* 66* 60*  CREATININE 2.94* 2.93*  < > 3.23* 3.00* 2.91*  CALCIUM 9.4 10.0  < > 9.5 9.2 9.2  PHOS 3.7 4.4  --   --   --   --   < > = values in this interval not displayed. No results for input(s): AST, ALT, ALKPHOS, BILITOT, PROT, ALBUMIN in the last 168 hours.  Recent Labs Lab 09/06/16 0230 09/07/16 0448 09/09/16 0950  WBC 6.5 6.7 7.7  HGB 10.7* 11.0* 10.7*  HCT 33.3* 34.3* 32.8*  MCV 93.0 93.5 92.1  PLT 153 148* 142*   Iron/TIBC/Ferritin/ %Sat No results found for: IRON, TIBC,  FERRITIN, IRONPCTSAT

## 2016-09-12 NOTE — Progress Notes (Signed)
24 hour creatinine clearance urine collection started at 12 midnight 6/17

## 2016-09-12 NOTE — Progress Notes (Signed)
Administered 2mg  Ativan as ordered prior to MRI. Will continue to monitor.

## 2016-09-13 DIAGNOSIS — R410 Disorientation, unspecified: Secondary | ICD-10-CM

## 2016-09-13 LAB — CREATININE CLEARANCE, URINE, 24 HOUR
COLLECTION INTERVAL-CRCL: 24 h
CREAT CLEAR: 17 mL/min — AB (ref 75–125)
Creatinine, 24H Ur: 704 mg/d — ABNORMAL LOW (ref 800–2000)
Creatinine, Urine: 88.04 mg/dL
URINE TOTAL VOLUME-CRCL: 800 mL

## 2016-09-13 LAB — BASIC METABOLIC PANEL
ANION GAP: 8 (ref 5–15)
BUN: 60 mg/dL — ABNORMAL HIGH (ref 6–20)
CHLORIDE: 105 mmol/L (ref 101–111)
CO2: 24 mmol/L (ref 22–32)
Calcium: 9.8 mg/dL (ref 8.9–10.3)
Creatinine, Ser: 2.89 mg/dL — ABNORMAL HIGH (ref 0.61–1.24)
GFR calc non Af Amer: 19 mL/min — ABNORMAL LOW (ref >60)
GFR, EST AFRICAN AMERICAN: 22 mL/min — AB (ref >60)
Glucose, Bld: 145 mg/dL — ABNORMAL HIGH (ref 65–99)
POTASSIUM: 4.6 mmol/L (ref 3.5–5.1)
Sodium: 137 mmol/L (ref 135–145)

## 2016-09-13 LAB — MAGNESIUM: Magnesium: 2.4 mg/dL (ref 1.7–2.4)

## 2016-09-13 MED ORDER — CLONIDINE HCL 0.2 MG PO TABS
0.2000 mg | ORAL_TABLET | Freq: Two times a day (BID) | ORAL | Status: DC
  Filled 2016-09-13: qty 1

## 2016-09-13 NOTE — Progress Notes (Signed)
[facility] Progress Note   Subjective: Per RN staff, having trouble swallowing some pills this AM.  Brady to 30s overnight, order placed overnight to hold clonidine.    Vitals:   09/13/16 0400 09/13/16 0500 09/13/16 0600 09/13/16 0800  BP: (!) 155/57 (!) 144/58 (!) 135/56 (!) 150/58  Pulse: (!) 47 (!) 49 (!) 31 (!) 51  Resp: (!) 23 (!) 23 20 (!) 23  Temp: 98.9 F (37.2 C)   98.4 F (36.9 C)  TempSrc: Axillary   Oral  SpO2: 96% 90% 96% 93%  Weight:  58.1 kg (128 lb 1.4 oz)    Height:        Inpatient medications: . amLODipine  10 mg Oral Daily  . aspirin EC  81 mg Oral Daily  . atorvastatin  40 mg Oral Daily  . brimonidine  1 drop Both Eyes BID  . brinzolamide  1 drop Both Eyes BID  . cloNIDine  0.2 mg Oral BID  . doxazosin  8 mg Oral Daily  . feeding supplement (ENSURE ENLIVE)  237 mL Oral BID BM  . haloperidol lactate  2 mg Intravenous QID  . heparin  5,000 Units Subcutaneous Q8H  . hydrALAZINE  100 mg Oral TID  . levothyroxine  137 mcg Oral QAC breakfast  . lisinopril  10 mg Oral BID  . multivitamin with minerals  1 tablet Oral Daily   . dextrose 5 % and 0.9% NaCl 50 mL/hr at 09/12/16 1856   acetaminophen, hydrALAZINE, traMADol  Exam: Gen thin, elderly AAM, falling asleep while eating breakfast this AM HEENT Sclera anicteric NECK No jvd or bruits PULM clear bilat CV RRR no MRG ABD soft ntnd no mass or ascites +bs, no scars or hsm MSK no joint effusions or deformity EXT no LE or UE edema / no wounds or ulcers Neuro is alert, Ox 1, not sure year or date, no asterixis.     Assess: 1  CKD stage IV - eGFR around 17- 20. 24 hr urine clearance pending. F/B Dr Webb.  2  AMS - psych vs dementia vs other. Doubt uremia w/ renal function as it is.  3  HTN urgency - lisinopril added yesterday. Can't take BB with bradycardia. Don't over-treat BP as the DBP is relatively low, goal SBP 140- 170 for now.  Since he is having bradycardia will lower dose of  clonidine if added back--> need to initiate taper, perhaps could come off completely with addition of ACEi 4  Hx CAD      MD [facility] pager [phone] 09/13/2016, 9:06 AM    Recent Labs Lab 09/07/16 0448  09/11/16 0304 09/12/16 0320 09/13/16 0318  NA 143  < > 136 135 137  K 3.8  < > 4.0 4.2 4.6  CL 110  < > 103 104 105  CO2 24  < > 25 24 24  GLUCOSE 80  < > 106* 100* 145*  BUN 51*  < > 66* 60* 60*  CREATININE 2.93*  < > 3.00* 2.91* 2.89*  CALCIUM 10.0  < > 9.2 9.2 9.8  PHOS 4.4  --   --   --   --   < > = values in this interval not displayed. No results for input(s): AST, ALT, ALKPHOS, BILITOT, PROT, ALBUMIN in the last 168 hours.  Recent Labs Lab 09/07/16 0448 09/09/16 0950  WBC 6.7 7.7  HGB 11.0* 10.7*  HCT 34.3* 32.8*  MCV 93.5 92.1  PLT 148* 142*     Iron/TIBC/Ferritin/ %Sat No results found for: IRON, TIBC, FERRITIN, IRONPCTSAT   

## 2016-09-13 NOTE — Progress Notes (Signed)
[place] TEAM 1 - Stepdown/ICU TEAM  Jeffrey Campbell  XTG:[id number] DOB: 10/12/1932 DOA: 09/04/2016 PCP: [name], MD    Brief Narrative:  83 y/o M Hx Carotid artery stenosis, Diastolic dysfunction, Dilated aortic root, Systolic Heart murmur, HTN, Pulmonary hypertension, OSA, HLD, CKD stage III, and DM2 who presented with confusion worsening over a week w/ severe HTN.  In the ED, he was started on a nicardipine gtt to manage his BP and PCCM was asked to admit.  Significant Events: 6/9 admit by PCCM 6/11 TTE - EF 60-65 % - grade 1 diastolic dysfunction - severely dilated LA - PA peak pressure 46 mmHg 6/12 TRH assumed care 6/16 MRI brain - no acute CVA - chronic microvascular ischemia   Subjective: The patient is sitting up in a bedside chair.  He is more alert than the last time I saw him but remains mildly confused.  He denies chest pain shortness of breath fevers or chills.  He can tell me the year the state and the county but does not know where he is or why he is here.  Assessment & Plan:  Hypertensive emergency Blood pressure is much improved at this time - stop clonidine given persisting bradycardia - follow blood pressure trend  Acute hypertensive encephalopathy - Psychosis - Acute hospital delirium  Slowly improving but certainly not yet back to baseline - MRI brain unrevealing - transfer out of step down unit - stop Haldol to avoid sedation  Pulmonary venous hypertension Has had an extensive workup via the CHF team - classified as group 2 related to elevated left heart pressures  Junctional Bradycardia This is a chronic problem for which the patient is followed by cardiology in the outpatient setting - stop clonidine and follow on telemetry  CKD stage III Creatinine holding steady with range of 2.6-3.0 - baseline crt 2.7 - nephrology now following  Hypothyroidism TSH at goal  COPD Well compensated at present  OSA Reportedly intolerant to CPAP  Severe  malnutrition in context of chronic illness  DVT prophylaxis: Subcutaneous heparin Code Status: FULL CODE Family Communication: no family present at time of exam  Disposition Plan: Transfer to telemetry bed - titrate medications - ambulate - follow mental status  Consultants:  PCCM  Antimicrobials:  none  Objective: Blood pressure (!) 150/58, pulse (!) 51, temperature 98.4 F (36.9 C), temperature source Oral, resp. rate (!) 23, height 5\' 10"  (1.778 m), weight 58.1 kg (128 lb 1.4 oz), SpO2 93 %.  Intake/Output Summary (Last 24 hours) at 09/13/16 1145 Last data filed at 09/13/16 0900  Gross per 24 hour  Intake              590 ml  Output              625 ml  Net              -35 ml   Filed Weights   09/11/16 0405 09/12/16 0228 09/13/16 0500  Weight: 56.2 kg (123 lb 14.4 oz) 57.3 kg (126 lb 5.2 oz) 58.1 kg (128 lb 1.4 oz)    Examination: General: No acute respiratory distress Lungs: CTA B w/o wheeze  Cardiovascular: Bradycardic at 40 bpm without appreciable murmur gallop or rub Abdomen: NT/ND, soft, bs+, no mass Extremities:  No signif C/C/E B LE   CBC:  Recent Labs Lab 09/07/16 0448 09/09/16 0950  WBC 6.7 7.7  HGB 11.0* 10.7*  HCT 34.3* 32.8*  MCV 93.5 92.1  PLT 148* 142*  Basic Metabolic Panel:  Recent Labs Lab 09/07/16 0448 09/09/16 0950 09/10/16 0445 09/11/16 0304 09/12/16 0320 09/13/16 0318  NA 143 133* 138 136 135 137  K 3.8 4.1 3.6 4.0 4.2 4.6  CL 110 100* 100* 103 104 105  CO2 24 24 26 25 24 24   GLUCOSE 80 134* 102* 106* 100* 145*  BUN 51* 63* 65* 66* 60* 60*  CREATININE 2.93* 3.51* 3.23* 3.00* 2.91* 2.89*  CALCIUM 10.0 9.7 9.5 9.2 9.2 9.8  MG 2.1 2.1 2.3 2.3 2.4 2.4  PHOS 4.4  --   --   --   --   --    GFR: Estimated Creatinine Clearance: 16.5 mL/min (A) (by C-G formula based on SCr of 2.89 mg/dL (H)).  Liver Function Tests: No results for input(s): AST, ALT, ALKPHOS, BILITOT, PROT, ALBUMIN in the last 168 hours. No results for  input(s): LIPASE, AMYLASE in the last 168 hours. No results for input(s): AMMONIA in the last 168 hours.  CBG:  Recent Labs Lab 09/12/16 1634  GLUCAP 157*    Recent Results (from the past 240 hour(s))  MRSA PCR Screening     Status: None   Collection Time: 09/05/16  4:08 AM  Result Value Ref Range Status   MRSA by PCR NEGATIVE NEGATIVE Final    Comment:        The GeneXpert MRSA Assay (FDA approved for NASAL specimens only), is one component of a comprehensive MRSA colonization surveillance program. It is not intended to diagnose MRSA infection nor to guide or monitor treatment for MRSA infections.      Scheduled Meds: . amLODipine  10 mg Oral Daily  . aspirin EC  81 mg Oral Daily  . atorvastatin  40 mg Oral Daily  . brimonidine  1 drop Both Eyes BID  . brinzolamide  1 drop Both Eyes BID  . cloNIDine  0.2 mg Oral BID  . doxazosin  8 mg Oral Daily  . feeding supplement (ENSURE ENLIVE)  237 mL Oral BID BM  . haloperidol lactate  2 mg Intravenous QID  . heparin  5,000 Units Subcutaneous Q8H  . hydrALAZINE  100 mg Oral TID  . levothyroxine  137 mcg Oral QAC breakfast  . lisinopril  10 mg Oral BID  . multivitamin with minerals  1 tablet Oral Daily     LOS: 8 days   Cherene Altes, MD [facility] Office  [phone] Pager - Text Page per Shea Evans as per below:  On-Call/Text Page:      [email or web address]      password [email or web address]  If 7PM-7AM, please contact night-coverage [email or web address] Password [email or web address] 09/13/2016, 11:45 AM

## 2016-09-13 NOTE — Care Management Important Message (Signed)
Important Message  Patient Details  Name: Jeffrey Campbell MRN: [id number] Date of Birth: [birth year]   Medicare Important Message Given:  Yes    Nathen May 09/13/2016, 10:56 AM

## 2016-09-13 NOTE — NC FL2 (Signed)
[place] LEVEL OF CARE SCREENING TOOL     IDENTIFICATION  Patient Name: Jeffrey Campbell Birthdate: [birth year] Sex: male Admission Date (Current Location): 09/04/2016  [facility] and [place] Number:  Herbalist and Address:  [facility] [place], [place] [address], [place], [place] [address]      Provider Number: [id number]  Attending Physician Name and Address:  Cherene Altes, MD  Relative Name and Phone Number:       Current Level of Care: Hospital Recommended Level of Care: [place] Prior Approval Number:    Date Approved/Denied:   PASRR Number: [id number]  Discharge Plan: SNF    Current Diagnoses: Patient Active Problem List   Diagnosis Date Noted  . Hypertensive emergency 09/05/2016  . PAD (peripheral artery disease) ([place]) 04/20/2016  . Dilated aortic root ([place]) 01/13/2016  . Bilateral renal artery stenosis ([place]) 02/18/2015  . Pulmonary hypertension ([place]) 02/10/2015  . Chronic diastolic CHF (congestive heart failure) ([place]) 02/10/2015  . Renal bruit 01/07/2015  . Carotid artery stenosis   . Diastolic dysfunction   . Bradycardia 05/29/2013  . Mitral regurgitation 11/27/2012  . MVP (mitral valve prolapse) 11/27/2012  . Glaucoma 04/09/2011  . COPD (chronic obstructive pulmonary disease) ([place]) 09/21/2010  . OSA (obstructive sleep apnea)   . Upper airway resistance syndrome   . Hyperthyroidism   . Type 2 diabetes, diet controlled ([place])   . CKD (chronic kidney disease), stage III   . CAD (coronary artery disease)   . Hyperlipemia   . Hypertensive heart and chronic kidney disease with heart failure and stage 1 through stage 4 chronic kidney disease, or chronic kidney disease ([place])   . PUD (peptic ulcer disease)     Orientation RESPIRATION BLADDER Height & Weight     Self  Normal External catheter Weight: 128 lb 1.4 oz (58.1 kg) Height:  5\' 10"  (177.8 cm)  BEHAVIORAL SYMPTOMS/MOOD NEUROLOGICAL BOWEL  NUTRITION STATUS      Continent Diet (renal; carb modified)  AMBULATORY STATUS COMMUNICATION OF NEEDS Skin   Limited Assist Verbally                         Personal Care Assistance Level of Assistance  Bathing, Dressing Bathing Assistance: Limited assistance   Dressing Assistance: Limited assistance     Functional Limitations Info             SPECIAL CARE FACTORS FREQUENCY  PT (By licensed PT), OT (By licensed OT)     PT Frequency: 5/wk OT Frequency: 5/wk            Contractures      Additional Factors Info  Code Status, Allergies Code Status Info: FULL Allergies Info: Tiotropium Bromide Monohydrate, Budesonide-formoterol Fumarate, Other, Tricor Fenofibrate, Zocor Simvastatin           Current Medications (09/13/2016):  This is the current hospital active medication list Current Facility-Administered Medications  Medication Dose Route Frequency Provider Last Rate Last Dose  . acetaminophen (TYLENOL) tablet 650 mg  650 mg Oral Q6H PRN Cherene Altes, MD   650 mg at 09/08/16 1856  . amLODipine (NORVASC) tablet 10 mg  10 mg Oral Daily Allie Bossier, MD   10 mg at 09/12/16 1428  . aspirin EC tablet 81 mg  81 mg Oral Daily Luz Brazen, MD   81 mg at 09/13/16 1131  . atorvastatin (LIPITOR) tablet 40 mg  40 mg Oral Daily  Collene Gobble, MD   40 mg at 09/13/16 1130  . brimonidine (ALPHAGAN) 0.2 % ophthalmic solution 1 drop  1 drop Both Eyes BID Allie Bossier, MD   1 drop at 09/13/16 1141  . brinzolamide (AZOPT) 1 % ophthalmic suspension 1 drop  1 drop Both Eyes BID Allie Bossier, MD   1 drop at 09/13/16 1140  . dextrose 5 %-0.9 % sodium chloride infusion   Intravenous Continuous Allie Bossier, MD 50 mL/hr at 09/12/16 1856    . doxazosin (CARDURA) tablet 8 mg  8 mg Oral Daily Skeet Simmer, RPH   8 mg at 09/13/16 1149  . feeding supplement (ENSURE ENLIVE) (ENSURE ENLIVE) liquid 237 mL  237 mL Oral BID BM Rush Farmer, MD   237 mL at 09/13/16 1000   . heparin injection 5,000 Units  5,000 Units Subcutaneous Q8H Luz Brazen, MD   5,000 Units at 09/13/16 [phone]  . hydrALAZINE (APRESOLINE) injection 10 mg  10 mg Intravenous Q4H PRN Roney Jaffe, MD   10 mg at 09/12/16 1302  . hydrALAZINE (APRESOLINE) tablet 100 mg  100 mg Oral TID Allie Bossier, MD   100 mg at 09/12/16 2135  . levothyroxine (SYNTHROID, LEVOTHROID) tablet 137 mcg  137 mcg Oral QAC breakfast Collene Gobble, MD   137 mcg at 09/13/16 [phone]  . lisinopril (PRINIVIL,ZESTRIL) tablet 10 mg  10 mg Oral BID Allie Bossier, MD   10 mg at 09/12/16 2136  . multivitamin with minerals tablet 1 tablet  1 tablet Oral Daily Luz Brazen, MD   1 tablet at 09/13/16 1131  . traMADol (ULTRAM) tablet 50 mg  50 mg Oral Q6H PRN Cherene Altes, MD         Discharge Medications: Please see discharge summary for a list of discharge medications.  Relevant Imaging Results:  Relevant Lab Results:   Additional Information SS#: [id number]  Jorge Ny, LCSW

## 2016-09-13 NOTE — Evaluation (Signed)
Occupational Therapy Evaluation Patient Details Name: Jeffrey Campbell MRN: [id number] DOB: Oct 04, 1932 Today's Date: 09/13/2016    History of Present Illness Pt adm with hypertensive emergency and acute encephalopathy. PMH - DM, CAD, CKD, etoh abuse, diastolic dysfunction   Clinical Impression   Pt was independent in self care and reports walking with a cane or holding the furniture. No family available to confirm PLOF. Pt presents with low volume voice. He is disoriented to time and situation and demonstrates some slow processing, but follows commands consistently. Pt demonstrates poor standing balance. 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 Recommending SNF for ST rehab prior to return home. Will follow.    Follow Up Recommendations  SNF    Equipment Recommendations       Recommendations for Other Services       Precautions / Restrictions Precautions Precautions: Fall Restrictions Weight Bearing Restrictions: No      Mobility Bed Mobility               General bed mobility comments: pt in chair  Transfers Overall transfer level: Needs assistance Equipment used: 1 person hand held assist Transfers: Sit to/from Stand Sit to Stand: Min assist         General transfer comment: Assist for balance. Posterior lean with legs propped against chair.    Balance Overall balance assessment: Needs assistance   Sitting balance-Leahy Scale: Fair       Standing balance-Leahy Scale: Poor                             ADL either performed or assessed with clinical judgement   ADL Overall ADL's : Needs assistance/impaired Eating/Feeding: Independent;Sitting   Grooming: Minimal assistance;Sitting;Wash/dry hands;Wash/dry face   Upper Body Bathing: Minimal assistance;Sitting   Lower Body Bathing: Minimal assistance;Sit to/from stand   Upper Body Dressing : Minimal assistance;Sitting   Lower Body Dressing: Minimal assistance;Sit  to/from stand Lower Body Dressing Details (indicate cue type and reason): can cross foot over opposite knee to don socks Toilet Transfer: Minimal assistance;Ambulation   Toileting- Clothing Manipulation and Hygiene: Minimal assistance;Sit to/from stand       Functional mobility during ADLs: Minimal assistance (hand held)       Vision Baseline Vision/History: Legally blind;Wears glasses Patient Visual Report: No change from baseline Additional Comments: blind in R eye     Perception     Praxis      Pertinent Vitals/Pain Pain Assessment: No/denies pain     Hand Dominance Right   Extremity/Trunk Assessment Upper Extremity Assessment Upper Extremity Assessment: Overall WFL for tasks assessed   Lower Extremity Assessment Lower Extremity Assessment: Defer to PT evaluation       Communication Communication Communication: No difficulties (low volume)   Cognition Arousal/Alertness: Awake/alert Behavior During Therapy: WFL for tasks assessed/performed Overall Cognitive Status: Impaired/Different from baseline Area of Impairment: Orientation;Memory;Problem solving;Safety/judgement                 Orientation Level: Disoriented to;Situation;Time Current Attention Level: Sustained Memory: Decreased recall of precautions;Decreased short-term memory   Safety/Judgement: Decreased awareness of safety;Decreased awareness of deficits   Problem Solving: Slow processing;Decreased initiation;Requires verbal cues;Requires tactile cues;Difficulty sequencing     General Comments       Exercises     Shoulder Instructions      Home Living Family/patient expects to be discharged to:: Private residence Living Arrangements: Spouse/significant other Available Help at  Discharge: Family;Available 24 hours/day (wife cannot physically assist) Type of Home: House Home Access: Level entry     Home Layout: One level     Bathroom Shower/Tub: Teacher, early years/pre:  Standard     Home Equipment: Cane - single point;Hand held shower head;Shower seat          Prior Functioning/Environment Level of Independence: Independent        Comments: pt reports driving short distances, assists wife with housekeeping        OT Problem List: Impaired balance (sitting and/or standing);Decreased cognition;Decreased safety awareness;Decreased knowledge of use of DME or AE      OT Treatment/Interventions: Self-care/ADL training    OT Goals(Current goals can be found in the care plan section) Acute Rehab OT Goals Patient Stated Goal: Not stated OT Goal Formulation: With patient Time For Goal Achievement: 09/27/16 Potential to Achieve Goals: Good ADL Goals Pt Will Perform Grooming: with supervision;standing Pt Will Perform Upper Body Dressing: with supervision;sitting Pt Will Perform Lower Body Dressing: with supervision;sit to/from stand Pt Will Transfer to Toilet: with supervision;ambulating;bedside commode (over toilet) Pt Will Perform Toileting - Clothing Manipulation and hygiene: with supervision;sit to/from stand Additional ADL Goal #1: Pt will use environmental cues to correctly respond to orientation questions. Additional ADL Goal #2: Pt will identify and gather items necessary for ADL with supervision.  OT Frequency: Min 2X/week   Barriers to D/C:            Co-evaluation              AM-PAC PT "6 Clicks" Daily Activity     Outcome Measure Help from another person eating meals?: None Help from another person taking care of personal grooming?: A Little Help from another person toileting, which includes using toliet, bedpan, or urinal?: A Little Help from another person bathing (including washing, rinsing, drying)?: A Little Help from another person to put on and taking off regular upper body clothing?: A Little Help from another person to put on and taking off regular lower body clothing?: A Little 6 Click Score: 19   End of Session  Equipment Utilized During Treatment: Gait belt  Activity Tolerance: Patient tolerated treatment well Patient left: in chair;with call bell/phone within reach;with chair alarm set  OT Visit Diagnosis: Unsteadiness on feet (R26.81);Other symptoms and signs involving cognitive function                Time: 6226-3335 OT Time Calculation (min): 21 min Charges:  OT General Charges $OT Visit: 1 Procedure OT Evaluation $OT Eval Moderate Complexity: 1 Procedure G-Codes:      Malka So 09/13/2016, 3:00 PM  [phone]

## 2016-09-13 NOTE — Progress Notes (Signed)
Physical Therapy Treatment Patient Details Name: Jeffrey Campbell MRN: [id number] DOB: October 14, 1932 Today's Date: 09/13/2016    History of Present Illness Pt adm with hypertensive emergency and acute encephalopathy. PMH - DM, CAD, CKD, etoh abuse, diastolic dysfunction    PT Comments    Pt calm and cooperative. Used walker today which actually resulted in much slower gait.    Follow Up Recommendations  SNF     Equipment Recommendations  Other (comment) (To be assessed)    Recommendations for Other Services       Precautions / Restrictions Precautions Precautions: Fall Restrictions Weight Bearing Restrictions: No    Mobility  Bed Mobility Overal bed mobility: Needs Assistance Bed Mobility: Supine to Sit     Supine to sit: Min assist     General bed mobility comments: Assist to elevate trunk into sitting and bring hips to EOB  Transfers Overall transfer level: Needs assistance Equipment used: Rolling walker (2 wheeled) Transfers: Sit to/from Stand Sit to Stand: Min assist         General transfer comment: Assist for balance. Posterior lean with legs propped against bed.  Ambulation/Gait Ambulation/Gait assistance: Min assist Ambulation Distance (Feet): 170 Feet Assistive device: Rolling walker (2 wheeled) Gait Pattern/deviations: Step-through pattern;Drifts right/left;Decreased step length - right;Decreased step length - left;Shuffle Gait velocity: decr Gait velocity interpretation: Below normal speed for age/gender General Gait Details: Assist for balance and support. VSS.    Stairs            Wheelchair Mobility    Modified Rankin (Stroke Patients Only)       Balance Overall balance assessment: Needs assistance Sitting-balance support: No upper extremity supported;Feet supported Sitting balance-Leahy Scale: Fair     Standing balance support: Single extremity supported Standing balance-Leahy Scale: Poor Standing balance comment: UE support  and min A for static standing. Posterior lean initally                            Cognition Arousal/Alertness: Awake/alert Behavior During Therapy: WFL for tasks assessed/performed Overall Cognitive Status: Impaired/Different from baseline Area of Impairment: Orientation;Attention;Memory;Following commands;Safety/judgement;Problem solving                 Orientation Level: Disoriented to;Place;Time;Situation Current Attention Level: Sustained Memory: Decreased recall of precautions;Decreased short-term memory Following Commands: Follows one step commands consistently Safety/Judgement: Decreased awareness of safety;Decreased awareness of deficits   Problem Solving: Slow processing;Decreased initiation;Requires verbal cues;Requires tactile cues;Difficulty sequencing        Exercises      General Comments        Pertinent Vitals/Pain Pain Assessment: No/denies pain    Home Living                      Prior Function            PT Goals (current goals can now be found in the care plan section) Progress towards PT goals: Progressing toward goals    Frequency    Min 2X/week      PT Plan Current plan remains appropriate;Frequency needs to be updated    Co-evaluation              AM-PAC PT "6 Clicks" Daily Activity  Outcome Measure  Difficulty turning over in bed (including adjusting bedclothes, sheets and blankets)?: A Little Difficulty moving from lying on back to sitting on the side of the bed? : Total Difficulty sitting down on and  standing up from a chair with arms (e.g., wheelchair, bedside commode, etc,.)?: Total Help needed moving to and from a bed to chair (including a wheelchair)?: A Little Help needed walking in hospital room?: A Little Help needed climbing 3-5 steps with a railing? : A Little 6 Click Score: 14    End of Session   Activity Tolerance: Patient tolerated treatment well Patient left: in chair;with call  bell/phone within reach;with chair alarm set (per nursing request for IV placement) Nurse Communication: Mobility status PT Visit Diagnosis: Unsteadiness on feet (R26.81);Muscle weakness (generalized) (M62.81)     Time: 3151-7616 PT Time Calculation (min) (ACUTE ONLY): 29 min  Charges:  $Gait Training: 23-37 mins                    G Codes:       [facility] PT [place] 09/13/2016, 10:51 AM

## 2016-09-13 NOTE — Progress Notes (Signed)
Nutrition Follow Up  DOCUMENTATION CODES:   Severe malnutrition in context of chronic illness  INTERVENTION:    Continue Ensure Enlive po BID, each supplement provides 350 kcal and 20 grams of protein  NUTRITION DIAGNOSIS:   Malnutrition (Severe) related to chronic illness (CAD, CKD, ETOH use) as evidenced by severe depletion of muscle mass, severe depletion of body fat, ongoing  GOAL:   Patient will meet greater than or equal to 90% of their needs, progressing   MONITOR:   PO intake, Supplement acceptance, Labs  ASSESSMENT:   Pt with PMH of CAD, CKD stage III, ETOH abuse, HTN, OSA, PUD, pulmonary HTN, DM admitted with worsening confusion and hypertensive emergency and hypertensive encephalopathy.    Pt less confused.  Out of restraints.  Walking around the unit with PT. S/p swallow evaluation today. No signs of aspiration. SLP rec Regular diet. PO intake variable at 25-100% per flowsheet records.  Enjoys Ensure Enlive supplements. Medications reviewed and include MVI daily. Labs reviewed. CBG 157.  Diet Order:  Diet regular Room service appropriate? Yes; Fluid consistency: Thin  Skin:  Reviewed, no issues  Last BM:  6/18  Height:   Ht Readings from Last 1 Encounters:  09/04/16 5\' 10"  (1.778 m)    Weight:   Wt Readings from Last 1 Encounters:  09/13/16 128 lb 1.4 oz (58.1 kg)    Ideal Body Weight:  75.4 kg  BMI:  Body mass index is 18.38 kg/m.  Estimated Nutritional Needs:   Kcal:  1700-1900  Protein:  85-100 grams  Fluid:  > 1.7 L/day  EDUCATION NEEDS:   No education needs identified at this time  Arthur Holms, RD, LDN Pager #: [phone] After-Hours Pager #: [phone]

## 2016-09-13 NOTE — Evaluation (Signed)
Clinical/Bedside Swallow Evaluation Patient Details  Name: Jeffrey Campbell MRN: [id number] Date of Birth: [birth year]  Today's Date: 09/13/2016 Time: SLP Start Time (ACUTE ONLY): 1134 SLP Stop Time (ACUTE ONLY): 1146 SLP Time Calculation (min) (ACUTE ONLY): 12 min  Past Medical History:  Past Medical History:  Diagnosis Date  . BPH (benign prostatic hyperplasia)   . CAD (coronary artery disease)   . Carotid artery stenosis    1-39% bilateral carotid stenosis by dopplers 2018  . CKD (chronic kidney disease), stage III   . Diastolic dysfunction   . Dilated aortic root ([place])    7mm by echo 02/2015  . Glaucoma   . Graves disease   . Heart murmur, systolic   . History of ETOH abuse   . History of PFTs 05/2010   moderate airflow obstruction w reduced DLCO by PFT   . Hyperlipemia   . Hypertension   . Hyperthyroidism 08/26/10   radioactive iodine therapy   . Mitral regurgitation echo 2015   mild  . Multiple thyroid nodules   . MVP (mitral valve prolapse) 11/2012   posterior MVP  . Organic impotence   . OSA (obstructive sleep apnea)    upper airway resistance syndrome with RDI 18/hr - not on CPAP due to insurance not covering  . PUD (peptic ulcer disease)   . Pulmonary hypertension ([place]) echo 2015   moderate PASP 29mmHg  . Shutdown renal    after cardiac cath  . Type 2 diabetes, diet controlled ([place])   . Upper airway resistance syndrome    Past Surgical History:  Past Surgical History:  Procedure Laterality Date  . APPENDECTOMY    . CARDIAC CATHETERIZATION    . CARDIAC CATHETERIZATION N/A 02/17/2015   Procedure: Right Heart Cath;  Surgeon: Larey Dresser, MD;  Location: [place] [facility];  Service: Cardiovascular;  Laterality: N/A;  . heart catherization    . HERNIA REPAIR  10/2009  . RIGHT HEART CATH N/A 07/28/2016   Procedure: Right Heart Cath;  Surgeon: Larey Dresser, MD;  Location: [place] [facility];  Service: Cardiovascular;  Laterality: N/A;   HPI:  83 y/o BM PMHx  Carotid artery stenosis,Diastolic dysfunction; Dilated aortic root, Systolic Heart murmur,Diastolic dysfunction, HTN,Pulmonary hypertension , HTN, OSA, HLD,CKD stage III, DM type II Who presents with confusion worsening over last week and severe hypertension. He has a normal MS at baseline, and was well at a recent appt in May. Found to ahve uremic encephalopathy concern for dementia. Noted to struggle with pills.    Assessment / Plan / Recommendation Clinical Impression  Pt demonstrates adequate tolerance of thin liquids and solids, no signs of aspiration. Pt also observed with 3 pills taken together with water. No oral holding, pt immediately orally transited pills. Discussed possibility of fluctuating function depending on arousal and mentation. If needed pills can be given in puree. Recommend pt resume a regular diet and thin liquids. No SLP f/u needed.       Aspiration Risk       Diet Recommendation Regular;Thin liquid   Liquid Administration via: Cup;Straw Medication Administration: Whole meds with liquid Supervision: Staff to assist with self feeding Postural Changes: Seated upright at 90 degrees    Other  Recommendations Oral Care Recommendations: Oral care BID   Follow up Recommendations None      Frequency and Duration            Prognosis        Swallow Study  General HPI: 83 y/o BM PMHx Carotid artery stenosis,Diastolic dysfunction; Dilated aortic root, Systolic Heart murmur,Diastolic dysfunction, HTN,Pulmonary hypertension , HTN, OSA, HLD,CKD stage III, DM type II Who presents with confusion worsening over last week and severe hypertension. He has a normal MS at baseline, and was well at a recent appt in May. Found to ahve uremic encephalopathy concern for dementia. Noted to struggle with pills.  Type of Study: Bedside Swallow Evaluation Previous Swallow Assessment: none Diet Prior to this Study: Regular;Thin liquids Temperature Spikes Noted: No Respiratory Status:  Room air History of Recent Intubation: No Behavior/Cognition: Alert;Cooperative;Pleasant mood Oral Cavity Assessment: Dry Oral Care Completed by SLP: No Oral Cavity - Dentition: Adequate natural dentition Vision: Functional for self-feeding Self-Feeding Abilities: Able to feed self Patient Positioning: Upright in chair Baseline Vocal Quality: Normal Volitional Cough: Strong Volitional Swallow: Able to elicit    Oral/Motor/Sensory Function Overall Oral Motor/Sensory Function: Within functional limits   Ice Chips Ice chips: Not tested   Thin Liquid Thin Liquid: Within functional limits Presentation: Straw;Self Fed    Nectar Thick Nectar Thick Liquid: Not tested   Honey Thick Honey Thick Liquid: Not tested   Puree Puree: Within functional limits Presentation: Spoon   Solid   GO   Solid: Within functional limits Presentation: [place] Kloe Oates, MA CCC-SLP [phone]  Lynann Beaver 09/13/2016,11:54 AM

## 2016-09-13 NOTE — Progress Notes (Signed)
Assumed care of patient at 0000, patient HR dropping to 35-38 non-sustained. On strip review some P waves dropped at times as well. All vitals stable, patient denies chest pain or SOB. MD Olevia Bowens made aware, new care order received to hold coreg dose 09/13/16. Will continue to monitor and will pass on information to next RN.

## 2016-09-13 NOTE — Clinical Social Work Note (Signed)
Clinical Social Work Assessment  Patient Details  Name: Jeffrey Campbell MRN: [id number] Date of Birth: Sep 22, 1932  Date of referral:  09/13/16               Reason for consult:  Facility Placement                Permission sought to share information with:  Chartered certified accountant granted to share information::  Yes, Verbal Permission Granted  Name::     Jeffrey Campbell  Agency::  SNF  Relationship::  wife  Contact Information:     Housing/Transportation Living arrangements for the past 2 months:  Single Family Home Source of Information:  Spouse Patient Interpreter Needed:  None Criminal Activity/Legal Involvement Pertinent to Current Situation/Hospitalization:  No - Comment as needed Significant Relationships:  Adult Children, Spouse Lives with:  Spouse Do you feel safe going back to the place where you live?  No Need for family participation in patient care:  Yes (Comment) (decision making at this time)  Care giving concerns:  Pt lives at home with spouse- has grandchildren and a dtr but they are not available for physical support.     Social Worker assessment / plan:  CSW spoke with pt wife concerning PT recommendation for SNF.  Explained SNF and SNF referral process.  Employment status:  Retired Forensic scientist:  [employer] PT Recommendations:  [place] / Referral to community resources:  [place]  Patient/Family's Response to care:  Wife acknowledges she would be unable to take care of pt with current physical needs. States neither patient or her have been to SNF before but she is aware of some and is agreeable to short term rehab.  Patient/Family's Understanding of and Emotional Response to Diagnosis, Current Treatment, and Prognosis:  No questions or concerns about current condition- hopeful that pt mental status will improve and he will be able to return home soon.  Emotional Assessment Appearance:   Appears stated age Attitude/Demeanor/Rapport:    Affect (typically observed):  Appropriate Orientation:  Oriented to Self, Oriented to Place, Oriented to  Time, Oriented to Situation Alcohol / Substance use:  Not Applicable Psych involvement (Current and /or in the community):     Discharge Needs  Concerns to be addressed:  Care Coordination Readmission within the last 30 days:  No Current discharge risk:  Physical Impairment Barriers to Discharge:  Continued Medical Work up   Jorge Ny, LCSW 09/13/2016, 4:22 PM

## 2016-09-14 DIAGNOSIS — N184 Chronic kidney disease, stage 4 (severe): Secondary | ICD-10-CM

## 2016-09-14 LAB — BASIC METABOLIC PANEL
Anion gap: 7 (ref 5–15)
BUN: 77 mg/dL — AB (ref 6–20)
CALCIUM: 9.2 mg/dL (ref 8.9–10.3)
CHLORIDE: 103 mmol/L (ref 101–111)
CO2: 24 mmol/L (ref 22–32)
CREATININE: 3.33 mg/dL — AB (ref 0.61–1.24)
GFR calc Af Amer: 19 mL/min — ABNORMAL LOW (ref >60)
GFR calc non Af Amer: 16 mL/min — ABNORMAL LOW (ref >60)
Glucose, Bld: 142 mg/dL — ABNORMAL HIGH (ref 65–99)
Potassium: 4.5 mmol/L (ref 3.5–5.1)
SODIUM: 134 mmol/L — AB (ref 135–145)

## 2016-09-14 MED ORDER — LEVOTHYROXINE SODIUM 25 MCG PO TABS
137.0000 ug | ORAL_TABLET | Freq: Every day | ORAL | Status: DC
  Administered 2016-09-14 – 2016-09-19 (×6): 137 ug via ORAL
  Administered 2016-09-20: 06:00:00 112 ug via ORAL
  Administered 2016-09-21 – 2016-09-24 (×4): 137 ug via ORAL
  Filled 2016-09-14 (×12): qty 1

## 2016-09-14 MED ORDER — LISINOPRIL 10 MG PO TABS
10.0000 mg | ORAL_TABLET | Freq: Every day | ORAL | Status: DC
  Administered 2016-09-14 – 2016-09-15 (×2): 10 mg via ORAL
  Filled 2016-09-14 (×2): qty 1

## 2016-09-14 NOTE — Progress Notes (Signed)
  [facility] Progress Note   Subjective: Sleeping this AM.  Loletha Grayer to 40s in absence of clonidine.  Cr up after addition of ACEi.  Vitals:   09/14/16 0128 09/14/16 0314 09/14/16 0702 09/14/16 0741  BP: (!) 162/122 (!) 136/51  (!) 122/93  Pulse:  61  (!) 45  Resp:    20  Temp:    97.9 F (36.6 C)  TempSrc:    Oral  SpO2:    97%  Weight:   62.2 kg (137 lb 3.2 oz)   Height:        Inpatient medications: . amLODipine  10 mg Oral Daily  . aspirin EC  81 mg Oral Daily  . atorvastatin  40 mg Oral Daily  . brimonidine  1 drop Both Eyes BID  . brinzolamide  1 drop Both Eyes BID  . doxazosin  8 mg Oral Daily  . feeding supplement (ENSURE ENLIVE)  237 mL Oral BID BM  . heparin  5,000 Units Subcutaneous Q8H  . hydrALAZINE  100 mg Oral TID  . levothyroxine  137 mcg Oral QAC breakfast  . lisinopril  10 mg Oral Daily  . multivitamin with minerals  1 tablet Oral Daily   . dextrose 5 % and 0.9% NaCl 50 mL/hr at 09/13/16 1450   acetaminophen, hydrALAZINE  Exam: Gen thin, elderly AAM, falling asleep while eating breakfast this AM HEENT Sclera anicteric NECK No jvd or bruits PULM clear bilat CV RRR no MRG ABD soft ntnd no mass or ascites +bs, no scars or hsm MSK no joint effusions or deformity EXT no LE or UE edema / no wounds or ulcers Neuro is alert, Ox 1, not sure year or date, no asterixis.     Assess: 1  CKD stage IV - eGFR around 17- 20. 24 hr urine clearance with CrCl of 17. 2  AMS - psych vs dementia vs other. Doubt uremia w/ renal function as it is. Per primary 3  HTN urgency - Cr increased from 2.8-->3.3 after addition of lisinopril 10 mg BID.  Decrease to daily.  Continue current doses of amlodipine 10 mg daily, cardura 8 mg daily, hydralazine 100 mg TID.  4  Hx CAD    Madelon Lips MD [facility] pager [phone] 09/14/2016, 8:48 AM    Recent Labs Lab 09/12/16 0320 09/13/16 0318 09/14/16 0348  NA 135 137 134*  K 4.2 4.6  4.5  CL 104 105 103  CO2 '24 24 24  '$ GLUCOSE 100* 145* 142*  BUN 60* 60* 77*  CREATININE 2.91* 2.89* 3.33*  CALCIUM 9.2 9.8 9.2   No results for input(s): AST, ALT, ALKPHOS, BILITOT, PROT, ALBUMIN in the last 168 hours.  Recent Labs Lab 09/09/16 0950  WBC 7.7  HGB 10.7*  HCT 32.8*  MCV 92.1  PLT 142*   Iron/TIBC/Ferritin/ %Sat No results found for: IRON, TIBC, FERRITIN, IRONPCTSAT

## 2016-09-14 NOTE — Consult Note (Signed)
   [facility] CM Inpatient Consult   09/14/2016  Jeffrey Campbell Apr 19, 1932 [id number]   Patient was reviewed for [place] Management needs post hospitalization and found that this patient is being recommended for skilled nursing care. Primary care provider listed as Dr. [name]. No community care management needs at this time as this patient is going to skilled nursing. Admitted on 09/04/16 with acute Encelphalopathy.   Natividad Brood, RN BSN [facility] Liaison  [phone] business mobile phone Toll free office [phone]

## 2016-09-14 NOTE — Progress Notes (Signed)
PROGRESS NOTE                                                                                                                                                                                                             Patient Demographics:    Jeffrey Campbell, is a 83 y.o. male, DOB - Mar 11, 1933, YSA:[id number]  Admit date - 09/04/2016   Admitting Physician Luz Brazen, MD  Outpatient Primary MD for the patient is [name], MD  LOS - 9  Outpatient Specialists: Dr Justin Mend  Chief Complaint  Patient presents with  . Altered Mental Status       Brief Narrative   83 year old male with history of diastolic CHF, coronary artery disease, carotid stenosis, pulmonary hypertension, OSA, chronic kidney disease stage III and diabetes mellitus who presented with acute encephalopathy and severe hypertension. He was also bradycardic in the 40s. Patient was admitted to ICU. 2-D echo showed normal EF with grade 1 diastolic dysfunction and severely dilated LA . MRI of the brain was negative for acute stroke and showed chronic microvascular ischemia. Patient transferred to stepdown and then to telemetry. Nephrology consulted.    Subjective:   Patient remains confused. Is oriented to time and knows he is in [place]. Is able to tell me that he follows with nephrologist as outpatient.   Assessment  & Plan :   Hypertensive emergency Blood pressure has improved. Clonidine discontinued due to sinus bradycardia. Continue amlodipine 10 mg daily, Cardura 8 mg daily and hydralazine 100 mg 3 times a day. Added lisinopril 10 mg twice a day, dose reduced due to worsening renal function today.  Acute on Chronic kidney disease stage IV Nephrology following. Slightly worsened renal function this morning after adding lisinopril. Dose reduced to once daily.  Acute metabolic encephalopathy Suspect daily due to hypertensive encephalopathy. No signs  of infection. Avoid narcotics and benzos. Mental status  slowly improving.  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 Heart rate is too low 40s but is asymptomatic. Clonidine discontinued.  Hypothyroidism TSH normal. Continue Synthroid.  COPD Compensated.  OSA Patient reportedly has been intolerant to CPAP.  Pulmonary hypertension Has had extensive workup by heart failure team.      Code Status : full code  Family Communication  : None at bedside  Disposition Plan  :  SNF if blood pressure and renal function stable tomorrow.  Barriers For Discharge : Active symptoms  Consults  :  Nephrology  Procedures  : MRI brain  DVT Prophylaxis  :  Lovenox -   Lab Results  Component Value Date   PLT 142 (L) 09/09/2016    Antibiotics  :    Anti-infectives    None        Objective:   Vitals:   09/14/16 0314 09/14/16 0702 09/14/16 0741 09/14/16 1143  BP: (!) 136/51  (!) 122/93 (!) 125/102  Pulse: 61  (!) 45 (!) 44  Resp:   20 16  Temp:   97.9 F (36.6 C) 98 F (36.7 C)  TempSrc:   Oral Oral  SpO2:   97% 100%  Weight:  62.2 kg (137 lb 3.2 oz)    Height:        Wt Readings from Last 3 Encounters:  09/14/16 62.2 kg (137 lb 3.2 oz)  07/28/16 62.6 kg (138 lb)  07/21/16 63 kg (138 lb 12.8 oz)     Intake/Output Summary (Last 24 hours) at 09/14/16 1448 Last data filed at 09/14/16 0949  Gross per 24 hour  Intake              930 ml  Output              525 ml  Net              405 ml     Physical Exam  Gen: not in distress HEENT:  moist mucosa, supple neck Chest: clear b/l, no added sounds CVS: N S1&S2, no murmurs, rubs or gallop GI: soft, NT, ND,  Musculoskeletal: warm, no edema CNS: AAOX2, nonfocal    Data Review:    CBC  Recent Labs Lab 09/09/16 0950  WBC 7.7  HGB 10.7*  HCT 32.8*  PLT 142*  MCV 92.1  MCH 30.1  MCHC 32.6  RDW 14.6    Chemistries   Recent Labs Lab 09/09/16 0950  09/10/16 0445 09/11/16 0304 09/12/16 0320 09/13/16 0318 09/14/16 0348  NA 133* 138 136 135 137 134*  K 4.1 3.6 4.0 4.2 4.6 4.5  CL 100* 100* 103 104 105 103  CO2 24 26 25 24 24 24   GLUCOSE 134* 102* 106* 100* 145* 142*  BUN 63* 65* 66* 60* 60* 77*  CREATININE 3.51* 3.23* 3.00* 2.91* 2.89* 3.33*  CALCIUM 9.7 9.5 9.2 9.2 9.8 9.2  MG 2.1 2.3 2.3 2.4 2.4  --    ------------------------------------------------------------------------------------------------------------------ No results for input(s): CHOL, HDL, LDLCALC, TRIG, CHOLHDL, LDLDIRECT in the last 72 hours.  No results found for: HGBA1C ------------------------------------------------------------------------------------------------------------------ No results for input(s): TSH, T4TOTAL, T3FREE, THYROIDAB in the last 72 hours.  Invalid input(s): FREET3 ------------------------------------------------------------------------------------------------------------------ No results for input(s): VITAMINB12, FOLATE, FERRITIN, TIBC, IRON, RETICCTPCT in the last 72 hours.  Coagulation profile No results for input(s): INR, PROTIME in the last 168 hours.  No results for input(s): DDIMER in the last 72 hours.  Cardiac Enzymes No results for input(s): CKMB, TROPONINI, MYOGLOBIN in the last 168 hours.  Invalid input(s): CK ------------------------------------------------------------------------------------------------------------------    Component Value Date/Time   BNP 801.8 (H) 03/03/2015 1215   BNP 620.8 (H) 01/23/2015 0944    Inpatient Medications  Scheduled Meds: . amLODipine  10 mg Oral Daily  . aspirin EC  81 mg Oral Daily  . atorvastatin  40 mg Oral Daily  . brimonidine  1 drop Both Eyes BID  . brinzolamide  1 drop Both Eyes BID  . doxazosin  8 mg Oral Daily  . feeding supplement (ENSURE ENLIVE)  237 mL Oral BID BM  . heparin  5,000 Units Subcutaneous Q8H  . hydrALAZINE  100 mg Oral TID  . levothyroxine  137 mcg  Oral QAC breakfast  . lisinopril  10 mg Oral Daily  . multivitamin with minerals  1 tablet Oral Daily   Continuous Infusions: . dextrose 5 % and 0.9% NaCl 50 mL/hr at 09/13/16 1450   PRN Meds:.acetaminophen, hydrALAZINE  Micro Results Recent Results (from the past 240 hour(s))  MRSA PCR Screening     Status: None   Collection Time: 09/05/16  4:08 AM  Result Value Ref Range Status   MRSA by PCR NEGATIVE NEGATIVE Final    Comment:        The GeneXpert MRSA Assay (FDA approved for NASAL specimens only), is one component of a comprehensive MRSA colonization surveillance program. It is not intended to diagnose MRSA infection nor to guide or monitor treatment for MRSA infections.     Radiology Reports Dg Chest 2 View  Result Date: 09/04/2016 CLINICAL DATA:  Unsteady while standing. Cough and altered mental status. EXAM: CHEST  2 VIEW COMPARISON:  01/28/2015 CXR, chest CT 05/12/2016 FINDINGS: Stable cardiomegaly with aortic atherosclerosis. Mild central vascular congestion. No pneumonic consolidation, effusion or pneumothorax. No dominant mass is seen. No acute nor suspicious osseous abnormality. IMPRESSION: Cardiomegaly with aortic atherosclerosis. Mild vascular congestion since prior exam is noted. Electronically Signed   By: Ashley Royalty M.D.   On: 09/04/2016 22:42   Ct Head Wo Contrast  Result Date: 09/04/2016 CLINICAL DATA:  Altered mental status EXAM: CT HEAD WITHOUT CONTRAST TECHNIQUE: Contiguous axial images were obtained from the base of the skull through the vertex without intravenous contrast. COMPARISON:  None. FINDINGS: BRAIN: There is sulcal and ventricular prominence consistent with superficial and central atrophy. No intraparenchymal hemorrhage, mass effect nor midline shift. Periventricular and subcortical white matter hypodensities consistent with chronic small vessel ischemic disease are identified. No acute large vascular territory infarcts. No abnormal extra-axial fluid  collections. Basal cisterns are not effaced and midline. VASCULAR: Moderate calcific atherosclerosis of the carotid siphons. SKULL: No skull fracture. No significant scalp soft tissue swelling. SINUSES/ORBITS: The mastoid air-cells are clear. The included paranasal sinuses are well-aerated.The included ocular globes and orbital contents are non-suspicious. OTHER: None. IMPRESSION: 1. Chronic moderate degree small vessel ischemia with cerebral atrophy. 2. No acute intracranial abnormality. Electronically Signed   By: Ashley Royalty M.D.   On: 09/04/2016 22:12   Mr Brain Wo Contrast  Result Date: 09/12/2016 CLINICAL DATA:  Altered mental status EXAM: MRI HEAD WITHOUT CONTRAST TECHNIQUE: Multiplanar, multiecho pulse sequences of the brain and surrounding structures were obtained without intravenous contrast. COMPARISON:  CT brain 09/04/2016 FINDINGS: Brain: The midline structures are normal. There is no focal diffusion restriction to indicate acute infarct. There is beginning confluent hyperintense T2-weighted signal within the periventricular white matter, most often seen in the setting of chronic microvascular ischemia. No intraparenchymal hematoma or chronic microhemorrhage. Brain volume is normal for age without age-advanced or lobar predominant atrophy. The dura is normal and there is no extra-axial collection. Vascular: Major intracranial arterial and venous sinus flow voids are preserved. Skull and upper cervical spine: The visualized skull base, calvarium, upper cervical spine and extracranial soft tissues are normal. Sinuses/Orbits: No fluid levels or advanced mucosal thickening. No mastoid effusion. 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 IMPRESSION: 1.  No acute intracranial abnormality. 2. Advanced white matter disease, most commonly chronic microvascular ischemia. 3. Bilateral exophthalmos, in keeping with thyroid ophthalmopathy. Electronically Signed   By: Ulyses Jarred  M.D.   On: 09/12/2016 02:47   Dg Chest Port 1 View  Result Date: 09/06/2016 CLINICAL DATA:  Respiratory failure EXAM: PORTABLE CHEST 1 VIEW COMPARISON:  09/04/2016 FINDINGS: Improvement in vascular congestion. Cardiac enlargement. Negative for edema or effusion. Mild left lower lobe airspace disease has developed since the prior study most likely atelectasis. IMPRESSION: Improving vascular congestion. Mild left lower lobe atelectasis. Electronically Signed   By: Franchot Gallo M.D.   On: 09/06/2016 07:00    Time Spent in minutes  25   Louellen Molder M.D on 09/14/2016 at 2:48 PM  Between 7am to 7pm - Pager - [phone]  After 7pm go to [email or web address] - password [facility]  [facility] -  Office  [phone]

## 2016-09-14 NOTE — Progress Notes (Signed)
Client was informed that it is important to call prior to trying to get out of bed to help prevent falls. Call bell was in reach of client. [place] Student Nurse

## 2016-09-14 NOTE — Clinical Social Work Note (Addendum)
Patient's wife not at bedside. Per RN, patient oriented x 2-3. CSW left message for patient's wife. Will discuss bed offers when she calls back.  Dayton Scrape, CSW [phone]  11:17 pm Patient's wife called back. CSW provided bed offers.  Dayton Scrape, [place]

## 2016-09-14 NOTE — Progress Notes (Signed)
Patient refused CPAP for the night  

## 2016-09-15 DIAGNOSIS — N170 Acute kidney failure with tubular necrosis: Secondary | ICD-10-CM

## 2016-09-15 LAB — BASIC METABOLIC PANEL WITH GFR
Anion gap: 7 (ref 5–15)
BUN: 95 mg/dL — ABNORMAL HIGH (ref 6–20)
CO2: 22 mmol/L (ref 22–32)
Calcium: 9.2 mg/dL (ref 8.9–10.3)
Chloride: 104 mmol/L (ref 101–111)
Creatinine, Ser: 3.96 mg/dL — ABNORMAL HIGH (ref 0.61–1.24)
GFR calc Af Amer: 15 mL/min — ABNORMAL LOW
GFR calc non Af Amer: 13 mL/min — ABNORMAL LOW
Glucose, Bld: 131 mg/dL — ABNORMAL HIGH (ref 65–99)
Potassium: 4.5 mmol/L (ref 3.5–5.1)
Sodium: 133 mmol/L — ABNORMAL LOW (ref 135–145)

## 2016-09-15 NOTE — Progress Notes (Signed)
  [facility] Progress Note   Subjective: Sitting up and feeling well today.  Bradycardia continues.    Vitals:   09/14/16 2122 09/14/16 2355 09/15/16 0511 09/15/16 1157  BP: (!) 135/48  (!) 150/43 (!) 124/40  Pulse: (!) 46 (!) 43 (!) 47 (!) 53  Resp: '18  18 18  '$ Temp: 98.3 F (36.8 C)  99.3 F (37.4 C) 98.5 F (36.9 C)  TempSrc: Oral  Oral Oral  SpO2: 98%  97% 96%  Weight:   59.2 kg (130 lb 9.6 oz)   Height:        Inpatient medications: . amLODipine  10 mg Oral Daily  . aspirin EC  81 mg Oral Daily  . atorvastatin  40 mg Oral Daily  . brimonidine  1 drop Both Eyes BID  . brinzolamide  1 drop Both Eyes BID  . doxazosin  8 mg Oral Daily  . feeding supplement (ENSURE ENLIVE)  237 mL Oral BID BM  . heparin  5,000 Units Subcutaneous Q8H  . hydrALAZINE  100 mg Oral TID  . levothyroxine  137 mcg Oral QAC breakfast  . multivitamin with minerals  1 tablet Oral Daily    acetaminophen, hydrALAZINE  Exam: Gen thin, man, sitting in bed, awake, watching cartoon HEENT Sclera anicteric NECK No jvd or bruits PULM clear bilat CV RRR no MRG ABD soft ntnd no mass or ascites +bs, no scars or hsm MSK no joint effusions or deformity EXT no LE or UE edema / no wounds or ulcers Neuro is alert, Ox 1, not sure year or date, no asterixis.     Assess: 1  CKD stage IV - eGFR around 17- 20. 24 hr urine clearance with CrCl of 17.  He is followed by Dr. Justin Mend in clinic.   2  AMS - psych vs dementia vs other. Doubt uremia w/ renal function as it is. Per primary.  This appears to be improving.   3  HTN urgency - Cr increased from 2.8-->3.3--> 3.9 after addition of lisinopril Sunday/ Monday.  This was decreased to daily yesterday and stopped today.  Don't recommend restarting.  Continue amlodipine 10 mg daily, hydralazine 100 mg TID, and cardura 8 mg daily.   4  Hx CAD    [facility] pager [phone] 09/15/2016, 3:27 PM    Recent  Labs Lab 09/13/16 0318 09/14/16 0348 09/15/16 0737  NA 137 134* 133*  K 4.6 4.5 4.5  CL 105 103 104  CO2 '24 24 22  '$ GLUCOSE 145* 142* 131*  BUN 60* 77* 95*  CREATININE 2.89* 3.33* 3.96*  CALCIUM 9.8 9.2 9.2   No results for input(s): AST, ALT, ALKPHOS, BILITOT, PROT, ALBUMIN in the last 168 hours.  Recent Labs Lab 09/09/16 0950  WBC 7.7  HGB 10.7*  HCT 32.8*  MCV 92.1  PLT 142*   Iron/TIBC/Ferritin/ %Sat No results found for: IRON, TIBC, FERRITIN, IRONPCTSAT

## 2016-09-15 NOTE — Clinical Social Work Note (Signed)
Patient's wife not at bedside so CSW called her. She stated that she has decided on [facility] due to proximity to home and it's ratings but she would like to have her daughter tour the facility after work today first. [place] notified hospital liaison for the facility.  Dayton Scrape, [place]

## 2016-09-15 NOTE — Progress Notes (Signed)
PROGRESS NOTE                                                                                                                                                                                                             Patient Demographics:    Jeffrey Campbell, is a 83 y.o. male, DOB - Mar 27, 1933, WEX:[id number]  Admit date - 09/04/2016   Admitting Physician Luz Brazen, MD  Outpatient Primary MD for the patient is [name], MD  LOS - 10  Outpatient Specialists: Dr Justin Mend  Chief Complaint  Patient presents with  . Altered Mental Status       Brief Narrative   83 year old male with history of diastolic CHF, coronary artery disease, carotid stenosis, pulmonary hypertension, OSA, chronic kidney disease stage III and diabetes mellitus who presented with acute encephalopathy and severe hypertension. He was also bradycardic in the 40s. Patient was admitted to ICU. 2-D echo showed normal EF with grade 1 diastolic dysfunction and severely dilated LA . MRI of the brain was negative for acute stroke and showed chronic microvascular ischemia. Patient transferred to stepdown and then to telemetry. Nephrology consulted.    Subjective:   Seems better oriented today, oriented to place and time. Denies any headache, dizziness, chest pain or shortness of breath.   Assessment  & Plan :   Hypertensive emergency Blood pressure remains stable.. Clonidine discontinued due to sinus bradycardia. Continue amlodipine 10 mg daily, Cardura 8 mg daily and hydralazine 100 mg 3 times a day. Added lisinopril but given worsened renal function may need to hold it.  Acute on Chronic kidney disease stage IV Nephrology following. Renal function further worsened today (creatinine 2.96). Will discuss if ACE inhibitor needs to be discontinued.  Acute metabolic encephalopathy Suspected due to hypertensive encephalopathy. No signs of infection or uremia.  Slowly improving.  Junction bradycardia Appears chronic and is followed by cardiology as outpatient. Heart rate stable in low 50s today.  Hypothyroidism TSH normal. Continue Synthroid.  COPD Compensated.  OSA Patient reportedly has been intolerant to CPAP.  Pulmonary hypertension Has had extensive workup by heart failure team.      Code Status : full code  Family Communication  : None at bedside  Disposition Plan  : SNF once his renal function improved  Barriers For Discharge : Acute kidney injury    Consults  :  Nephrology  Procedures  : MRI brain  DVT Prophylaxis  :  Lovenox -   Lab Results  Component Value Date   PLT 142 (L) 09/09/2016    Antibiotics  :    Anti-infectives    None        Objective:   Vitals:   09/14/16 2122 09/14/16 2355 09/15/16 0511 09/15/16 1157  BP: (!) 135/48  (!) 150/43 (!) 124/40  Pulse: (!) 46 (!) 43 (!) 47 (!) 53  Resp: 18  18 18   Temp: 98.3 F (36.8 C)  99.3 F (37.4 C) 98.5 F (36.9 C)  TempSrc: Oral  Oral Oral  SpO2: 98%  97% 96%  Weight:   59.2 kg (130 lb 9.6 oz)   Height:        Wt Readings from Last 3 Encounters:  09/15/16 59.2 kg (130 lb 9.6 oz)  07/28/16 62.6 kg (138 lb)  07/21/16 63 kg (138 lb 12.8 oz)     Intake/Output Summary (Last 24 hours) at 09/15/16 1209 Last data filed at 09/15/16 0935  Gross per 24 hour  Intake              480 ml  Output              651 ml  Net             -171 ml     Physical Exam Gen.: Elderly male not in distress HEENT: Moist mucosa, supple neck Chest: Clear bilaterally  CVS: Normal S1 and S2, no murmurs GI: Soft, nondistended, nontender Musculoskeletal: Warm, no edema CNS: Alert and oriented 2, nonfocal     Data Review:    CBC  Recent Labs Lab 09/09/16 0950  WBC 7.7  HGB 10.7*  HCT 32.8*  PLT 142*  MCV 92.1  MCH 30.1  MCHC 32.6  RDW 14.6    Chemistries   Recent Labs Lab 09/09/16 0950 09/10/16 0445 09/11/16 0304 09/12/16 0320  09/13/16 0318 09/14/16 0348 09/15/16 0737  NA 133* 138 136 135 137 134* 133*  K 4.1 3.6 4.0 4.2 4.6 4.5 4.5  CL 100* 100* 103 104 105 103 104  CO2 24 26 25 24 24 24 22   GLUCOSE 134* 102* 106* 100* 145* 142* 131*  BUN 63* 65* 66* 60* 60* 77* 95*  CREATININE 3.51* 3.23* 3.00* 2.91* 2.89* 3.33* 3.96*  CALCIUM 9.7 9.5 9.2 9.2 9.8 9.2 9.2  MG 2.1 2.3 2.3 2.4 2.4  --   --    ------------------------------------------------------------------------------------------------------------------ No results for input(s): CHOL, HDL, LDLCALC, TRIG, CHOLHDL, LDLDIRECT in the last 72 hours.  No results found for: HGBA1C ------------------------------------------------------------------------------------------------------------------ No results for input(s): TSH, T4TOTAL, T3FREE, THYROIDAB in the last 72 hours.  Invalid input(s): FREET3 ------------------------------------------------------------------------------------------------------------------ No results for input(s): VITAMINB12, FOLATE, FERRITIN, TIBC, IRON, RETICCTPCT in the last 72 hours.  Coagulation profile No results for input(s): INR, PROTIME in the last 168 hours.  No results for input(s): DDIMER in the last 72 hours.  Cardiac Enzymes No results for input(s): CKMB, TROPONINI, MYOGLOBIN in the last 168 hours.  Invalid input(s): CK ------------------------------------------------------------------------------------------------------------------    Component Value Date/Time   BNP 801.8 (H) 03/03/2015 1215   BNP 620.8 (H) 01/23/2015 0944    Inpatient Medications  Scheduled Meds: . amLODipine  10 mg Oral Daily  . aspirin EC  81 mg Oral Daily  . atorvastatin  40 mg Oral Daily  . brimonidine  1 drop Both Eyes BID  . brinzolamide  1 drop Both Eyes  BID  . doxazosin  8 mg Oral Daily  . feeding supplement (ENSURE ENLIVE)  237 mL Oral BID BM  . heparin  5,000 Units Subcutaneous Q8H  . hydrALAZINE  100 mg Oral TID  . levothyroxine   137 mcg Oral QAC breakfast  . lisinopril  10 mg Oral Daily  . multivitamin with minerals  1 tablet Oral Daily   Continuous Infusions:  PRN Meds:.acetaminophen, hydrALAZINE  Micro Results No results found for this or any previous visit (from the past 240 hour(s)).  Radiology Reports Dg Chest 2 View  Result Date: 09/04/2016 CLINICAL DATA:  Unsteady while standing. Cough and altered mental status. EXAM: CHEST  2 VIEW COMPARISON:  01/28/2015 CXR, chest CT 05/12/2016 FINDINGS: Stable cardiomegaly with aortic atherosclerosis. Mild central vascular congestion. No pneumonic consolidation, effusion or pneumothorax. No dominant mass is seen. No acute nor suspicious osseous abnormality. IMPRESSION: Cardiomegaly with aortic atherosclerosis. Mild vascular congestion since prior exam is noted. Electronically Signed   By: Ashley Royalty M.D.   On: 09/04/2016 22:42   Ct Head Wo Contrast  Result Date: 09/04/2016 CLINICAL DATA:  Altered mental status EXAM: CT HEAD WITHOUT CONTRAST TECHNIQUE: Contiguous axial images were obtained from the base of the skull through the vertex without intravenous contrast. COMPARISON:  None. FINDINGS: BRAIN: There is sulcal and ventricular prominence consistent with superficial and central atrophy. No intraparenchymal hemorrhage, mass effect nor midline shift. Periventricular and subcortical white matter hypodensities consistent with chronic small vessel ischemic disease are identified. No acute large vascular territory infarcts. No abnormal extra-axial fluid collections. Basal cisterns are not effaced and midline. VASCULAR: Moderate calcific atherosclerosis of the carotid siphons. SKULL: No skull fracture. No significant scalp soft tissue swelling. SINUSES/ORBITS: The mastoid air-cells are clear. The included paranasal sinuses are well-aerated.The included ocular globes and orbital contents are non-suspicious. OTHER: None. IMPRESSION: 1. Chronic moderate degree small vessel ischemia with  cerebral atrophy. 2. No acute intracranial abnormality. Electronically Signed   By: Ashley Royalty M.D.   On: 09/04/2016 22:12   Mr Brain Wo Contrast  Result Date: 09/12/2016 CLINICAL DATA:  Altered mental status EXAM: MRI HEAD WITHOUT CONTRAST TECHNIQUE: Multiplanar, multiecho pulse sequences of the brain and surrounding structures were obtained without intravenous contrast. COMPARISON:  CT brain 09/04/2016 FINDINGS: Brain: The midline structures are normal. There is no focal diffusion restriction to indicate acute infarct. There is beginning confluent hyperintense T2-weighted signal within the periventricular white matter, most often seen in the setting of chronic microvascular ischemia. No intraparenchymal hematoma or chronic microhemorrhage. Brain volume is normal for age without age-advanced or lobar predominant atrophy. The dura is normal and there is no extra-axial collection. Vascular: Major intracranial arterial and venous sinus flow voids are preserved. Skull and upper cervical spine: The visualized skull base, calvarium, upper cervical spine and extracranial soft tissues are normal. Sinuses/Orbits: No fluid levels or advanced mucosal thickening. No mastoid effusion. 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 IMPRESSION: 1. No acute intracranial abnormality. 2. Advanced white matter disease, most commonly chronic microvascular ischemia. 3. Bilateral exophthalmos, in keeping with thyroid ophthalmopathy. Electronically Signed   By: Ulyses Jarred M.D.   On: 09/12/2016 02:47   Dg Chest Port 1 View  Result Date: 09/06/2016 CLINICAL DATA:  Respiratory failure EXAM: PORTABLE CHEST 1 VIEW COMPARISON:  09/04/2016 FINDINGS: Improvement in vascular congestion. Cardiac enlargement. Negative for edema or effusion. Mild left lower lobe airspace disease has developed since the prior study most likely atelectasis. IMPRESSION: Improving vascular congestion. Mild left  lower lobe  atelectasis. Electronically Signed   By: Franchot Gallo M.D.   On: 09/06/2016 07:00    Time Spent in minutes  25   Louellen Molder M.D on 09/15/2016 at 12:09 PM  Between 7am to 7pm - Pager - [phone]  After 7pm go to [email or web address] - password [facility]  [facility] -  Office  [phone]

## 2016-09-16 DIAGNOSIS — N179 Acute kidney failure, unspecified: Secondary | ICD-10-CM | POA: Diagnosis not present

## 2016-09-16 DIAGNOSIS — G9341 Metabolic encephalopathy: Secondary | ICD-10-CM | POA: Diagnosis present

## 2016-09-16 DIAGNOSIS — N183 Chronic kidney disease, stage 3 unspecified: Secondary | ICD-10-CM | POA: Diagnosis not present

## 2016-09-16 DIAGNOSIS — I674 Hypertensive encephalopathy: Secondary | ICD-10-CM | POA: Diagnosis present

## 2016-09-16 LAB — BASIC METABOLIC PANEL
ANION GAP: 8 (ref 5–15)
BUN: 110 mg/dL — ABNORMAL HIGH (ref 6–20)
CHLORIDE: 103 mmol/L (ref 101–111)
CO2: 21 mmol/L — ABNORMAL LOW (ref 22–32)
Calcium: 8.9 mg/dL (ref 8.9–10.3)
Creatinine, Ser: 4.53 mg/dL — ABNORMAL HIGH (ref 0.61–1.24)
GFR, EST AFRICAN AMERICAN: 13 mL/min — AB (ref >60)
GFR, EST NON AFRICAN AMERICAN: 11 mL/min — AB (ref >60)
Glucose, Bld: 159 mg/dL — ABNORMAL HIGH (ref 65–99)
POTASSIUM: 4.4 mmol/L (ref 3.5–5.1)
SODIUM: 132 mmol/L — AB (ref 135–145)

## 2016-09-16 LAB — URINALYSIS, ROUTINE W REFLEX MICROSCOPIC
BILIRUBIN URINE: NEGATIVE
Glucose, UA: NEGATIVE mg/dL
HGB URINE DIPSTICK: NEGATIVE
Ketones, ur: NEGATIVE mg/dL
NITRITE: NEGATIVE
Protein, ur: 100 mg/dL — AB
SPECIFIC GRAVITY, URINE: 1.014 (ref 1.005–1.030)
pH: 5 (ref 5.0–8.0)

## 2016-09-16 MED ORDER — HALOPERIDOL LACTATE 5 MG/ML IJ SOLN
1.0000 mg | INTRAMUSCULAR | Status: AC
  Administered 2016-09-16: 1 mg via INTRAVENOUS
  Filled 2016-09-16: qty 1

## 2016-09-16 MED ORDER — LORAZEPAM 2 MG/ML IJ SOLN
0.5000 mg | INTRAMUSCULAR | Status: DC | PRN
  Administered 2016-09-16: 1 mg via INTRAVENOUS
  Filled 2016-09-16 (×2): qty 1

## 2016-09-16 MED ORDER — DEXTROSE 5 % IV SOLN
1.0000 g | INTRAVENOUS | Status: AC
  Administered 2016-09-16 – 2016-09-20 (×5): 1 g via INTRAVENOUS
  Filled 2016-09-16 (×6): qty 10

## 2016-09-16 MED ORDER — CEFTRIAXONE SODIUM 1 G IJ SOLR: 1.0000 g | INTRAMUSCULAR | Status: DC

## 2016-09-16 MED ORDER — SODIUM CHLORIDE 0.9 % IV SOLN
INTRAVENOUS | Status: AC
  Administered 2016-09-16 – 2016-09-17 (×2): via INTRAVENOUS

## 2016-09-16 NOTE — Progress Notes (Signed)
Paged MD to make aware that pt is increasing confused with impulsiveness, frequently forget where he is at.

## 2016-09-16 NOTE — Progress Notes (Signed)
Physical Therapy Treatment Patient Details Name: Jeffrey Campbell MRN: [id number] DOB: 23-Oct-1932 Today's Date: 09/16/2016    History of Present Illness Pt adm with hypertensive emergency and acute encephalopathy. PMH - DM, CAD, CKD, etoh abuse, diastolic dysfunction    PT Comments    Patient continues to demonstrate balance and cognitive deficits increasing risk for falls. Continue to progress as tolerated with anticipated d/c to SNF for further skilled PT services.    Follow Up Recommendations  SNF     Equipment Recommendations  Other (comment) (To be assessed)    Recommendations for Other Services       Precautions / Restrictions Precautions Precautions: Fall Restrictions Weight Bearing Restrictions: No    Mobility  Bed Mobility               General bed mobility comments: Pt OOB in chair upon arrival  Transfers Overall transfer level: Needs assistance Equipment used: Rolling walker (2 wheeled) Transfers: Sit to/from Stand Sit to Stand: Min assist         General transfer comment: assist for balance; cues for safety; from recliner and BSC  Ambulation/Gait Ambulation/Gait assistance: Min assist;Mod assist Ambulation Distance (Feet): 200 Feet Assistive device: Rolling walker (2 wheeled) Gait Pattern/deviations: Step-through pattern;Drifts right/left;Decreased step length - right;Decreased step length - left;Shuffle;Narrow base of support;Trunk flexed Gait velocity: decr   General Gait Details: min A with RW and mod A without AD; cues for wider BOS; assist for balance; ambulated with and without AD; pt unsteady with RW with horizontal head turns and direction changes   Stairs            Wheelchair Mobility    Modified Rankin (Stroke Patients Only)       Balance Overall balance assessment: Needs assistance Sitting-balance support: No upper extremity supported;Feet supported Sitting balance-Leahy Scale: Fair     Standing balance support:  Single extremity supported Standing balance-Leahy Scale: Poor Standing balance comment: single UE required for balance             High level balance activites: Side stepping;Backward walking;Head turns High Level Balance Comments: pt with LOB laterally and posteriorly when performing high level balance activities            Cognition Arousal/Alertness: Awake/alert Behavior During Therapy: WFL for tasks assessed/performed Overall Cognitive Status: Impaired/Different from baseline Area of Impairment: Orientation;Attention;Memory;Following commands;Safety/judgement;Problem solving                 Orientation Level: Disoriented to;Place;Situation Current Attention Level: Sustained Memory: Decreased recall of precautions;Decreased short-term memory Following Commands: Follows one step commands consistently;Follows one step commands with increased time Safety/Judgement: Decreased awareness of safety;Decreased awareness of deficits   Problem Solving: Slow processing;Decreased initiation;Requires verbal cues;Requires tactile cues;Difficulty sequencing General Comments: pt required cues to initiate and follow through with tasks however a impulsive at times      Exercises      General Comments        Pertinent Vitals/Pain Pain Assessment: No/denies pain    Home Living                      Prior Function            PT Goals (current goals can now be found in the care plan section) Progress towards PT goals: Progressing toward goals    Frequency    Min 2X/week      PT Plan Current plan remains appropriate;Frequency needs to be updated  Co-evaluation              AM-PAC PT "6 Clicks" Daily Activity  Outcome Measure  Difficulty turning over in bed (including adjusting bedclothes, sheets and blankets)?: A Little Difficulty moving from lying on back to sitting on the side of the bed? : Total Difficulty sitting down on and standing up from a  chair with arms (e.g., wheelchair, bedside commode, etc,.)?: Total Help needed moving to and from a bed to chair (including a wheelchair)?: A Little Help needed walking in hospital room?: A Little Help needed climbing 3-5 steps with a railing? : A Little 6 Click Score: 14    End of Session Equipment Utilized During Treatment: Gait belt Activity Tolerance: Patient tolerated treatment well Patient left: in chair;with call bell/phone within reach;with chair alarm set Nurse Communication: Mobility status PT Visit Diagnosis: Unsteadiness on feet (R26.81);Muscle weakness (generalized) (M62.81)     Time: 5176-1607 PT Time Calculation (min) (ACUTE ONLY): 43 min  Charges:  $Gait Training: 8-22 mins $Therapeutic Activity: 23-37 mins                    G Codes:       Earney Navy, PTA Pager: [phone]     Darliss Cheney 09/16/2016, 9:32 AM

## 2016-09-16 NOTE — Progress Notes (Signed)
Pt is confused with impulsive movement. Reoriented and sat in the chair with chair exit alarm

## 2016-09-16 NOTE — Progress Notes (Signed)
Urinalysis and culture sent

## 2016-09-16 NOTE — Progress Notes (Signed)
Pt refuses CPAP  RT to monitor and assess as needed.  

## 2016-09-16 NOTE — Progress Notes (Signed)
PROGRESS NOTE                                                                                                                                                                                                             Patient Demographics:    Jeffrey Campbell, is a 83 y.o. male, DOB - [birth year], RPR:[id number]  Admit date - 09/04/2016   Admitting Physician Luz Brazen, MD  Outpatient Primary MD for the patient is [name], MD  LOS - 11  Outpatient Specialists: Dr Justin Mend  Chief Complaint  Patient presents with  . Altered Mental Status       Brief Narrative   83 year old male with history of diastolic CHF, coronary artery disease, carotid stenosis, pulmonary hypertension, OSA, chronic kidney disease stage III and diabetes mellitus who presented with acute encephalopathy and severe hypertension. He was also bradycardic in the 40s. Patient was admitted to ICU. 2-D echo showed normal EF with grade 1 diastolic dysfunction and severely dilated LA . MRI of the brain was negative for acute stroke and showed chronic microvascular ischemia. Patient transferred to stepdown and then to telemetry. Nephrology consulted.    Subjective:   Patient is quite oriented today, seems at his baseline. Denies any pain or discomfort.   Assessment  & Plan :   Hypertensive emergency Blood pressure has been stable. Clonidine discontinued due to sinus bradycardia. Currently on amlodipine, Cardura and hydralazine. Lisinopril was added by nephrology but discontinued due to worsened renal function.   Acute on Chronic kidney disease stage IV Nephrology following.  Function continues to worsen (4. 53) . ACE inhibitor has been discontinued. I will order gentle hydration.  Acute metabolic encephalopathy Suspected due to hypertensive encephalopathy. No signs of infection or uremia. Near baseline.  Junction bradycardia Appears chronic and is  followed by cardiology as outpatient. Heart rate stable in low 50s today.  Hypothyroidism TSH normal. Continue Synthroid.  COPD Compensated.  OSA Patient reportedly has been intolerant to CPAP.  Pulmonary hypertension Has had extensive workup by heart failure team.      Code Status : full code  Family Communication  : None at bedside  Disposition Plan  : SNF once his renal function improved  Barriers For Discharge : Acute kidney injury    Consults  :  Nephrology  Procedures  : MRI brain  DVT  Prophylaxis  :  Lovenox -   Lab Results  Component Value Date   PLT 142 (L) 09/09/2016    Antibiotics  :    Anti-infectives    None        Objective:   Vitals:   09/15/16 2330 09/16/16 0008 09/16/16 0532 09/16/16 1249  BP: (!) 153/42  (!) 160/40 (!) 162/42  Pulse: (!) 52 (!) 54 (!) 52 60  Resp:   16 16  Temp:   99 F (37.2 C) 98 F (36.7 C)  TempSrc:   Oral Oral  SpO2:   96% 98%  Weight:   61.3 kg (135 lb 3.2 oz)   Height:        Wt Readings from Last 3 Encounters:  09/16/16 61.3 kg (135 lb 3.2 oz)  07/28/16 62.6 kg (138 lb)  07/21/16 63 kg (138 lb 12.8 oz)     Intake/Output Summary (Last 24 hours) at 09/16/16 1253 Last data filed at 09/16/16 0936  Gross per 24 hour  Intake              600 ml  Output              426 ml  Net              174 ml     Physical Exam Gen.: Elderly male not in distress HEENT: Moist mucosa, supple neck Chest: Clear to auscultation bilaterally CVS: Normal S1 and S2, no murmurs,  GI: Soft, nondistended, nontender Musculoskeletal: Warm, no edema  CNS alert and oriented 2-3      Data Review:    CBC No results for input(s): WBC, HGB, HCT, PLT, MCV, MCH, MCHC, RDW, LYMPHSABS, MONOABS, EOSABS, BASOSABS, BANDABS in the last 168 hours.  Invalid input(s): NEUTRABS, BANDSABD  Chemistries   Recent Labs Lab 09/10/16 0445 09/11/16 0304 09/12/16 0320 09/13/16 0318 09/14/16 0348 09/15/16 0737 09/16/16 0343  NA  138 136 135 137 134* 133* 132*  K 3.6 4.0 4.2 4.6 4.5 4.5 4.4  CL 100* 103 104 105 103 104 103  CO2 26 25 24 24 24 22  21*  GLUCOSE 102* 106* 100* 145* 142* 131* 159*  BUN 65* 66* 60* 60* 77* 95* 110*  CREATININE 3.23* 3.00* 2.91* 2.89* 3.33* 3.96* 4.53*  CALCIUM 9.5 9.2 9.2 9.8 9.2 9.2 8.9  MG 2.3 2.3 2.4 2.4  --   --   --    ------------------------------------------------------------------------------------------------------------------ No results for input(s): CHOL, HDL, LDLCALC, TRIG, CHOLHDL, LDLDIRECT in the last 72 hours.  No results found for: HGBA1C ------------------------------------------------------------------------------------------------------------------ No results for input(s): TSH, T4TOTAL, T3FREE, THYROIDAB in the last 72 hours.  Invalid input(s): FREET3 ------------------------------------------------------------------------------------------------------------------ No results for input(s): VITAMINB12, FOLATE, FERRITIN, TIBC, IRON, RETICCTPCT in the last 72 hours.  Coagulation profile No results for input(s): INR, PROTIME in the last 168 hours.  No results for input(s): DDIMER in the last 72 hours.  Cardiac Enzymes No results for input(s): CKMB, TROPONINI, MYOGLOBIN in the last 168 hours.  Invalid input(s): CK ------------------------------------------------------------------------------------------------------------------    Component Value Date/Time   BNP 801.8 (H) 03/03/2015 1215   BNP 620.8 (H) 01/23/2015 0944    Inpatient Medications  Scheduled Meds: . amLODipine  10 mg Oral Daily  . aspirin EC  81 mg Oral Daily  . atorvastatin  40 mg Oral Daily  . brimonidine  1 drop Both Eyes BID  . brinzolamide  1 drop Both Eyes BID  . doxazosin  8 mg Oral Daily  . feeding  supplement (ENSURE ENLIVE)  237 mL Oral BID BM  . heparin  5,000 Units Subcutaneous Q8H  . hydrALAZINE  100 mg Oral TID  . levothyroxine  137 mcg Oral QAC breakfast  . multivitamin with  minerals  1 tablet Oral Daily   Continuous Infusions:  PRN Meds:.acetaminophen, hydrALAZINE  Micro Results No results found for this or any previous visit (from the past 240 hour(s)).  Radiology Reports Dg Chest 2 View  Result Date: 09/04/2016 CLINICAL DATA:  Unsteady while standing. Cough and altered mental status. EXAM: CHEST  2 VIEW COMPARISON:  01/28/2015 CXR, chest CT 05/12/2016 FINDINGS: Stable cardiomegaly with aortic atherosclerosis. Mild central vascular congestion. No pneumonic consolidation, effusion or pneumothorax. No dominant mass is seen. No acute nor suspicious osseous abnormality. IMPRESSION: Cardiomegaly with aortic atherosclerosis. Mild vascular congestion since prior exam is noted. Electronically Signed   By: Ashley Royalty M.D.   On: 09/04/2016 22:42   Ct Head Wo Contrast  Result Date: 09/04/2016 CLINICAL DATA:  Altered mental status EXAM: CT HEAD WITHOUT CONTRAST TECHNIQUE: Contiguous axial images were obtained from the base of the skull through the vertex without intravenous contrast. COMPARISON:  None. FINDINGS: BRAIN: There is sulcal and ventricular prominence consistent with superficial and central atrophy. No intraparenchymal hemorrhage, mass effect nor midline shift. Periventricular and subcortical white matter hypodensities consistent with chronic small vessel ischemic disease are identified. No acute large vascular territory infarcts. No abnormal extra-axial fluid collections. Basal cisterns are not effaced and midline. VASCULAR: Moderate calcific atherosclerosis of the carotid siphons. SKULL: No skull fracture. No significant scalp soft tissue swelling. SINUSES/ORBITS: The mastoid air-cells are clear. The included paranasal sinuses are well-aerated.The included ocular globes and orbital contents are non-suspicious. OTHER: None. IMPRESSION: 1. Chronic moderate degree small vessel ischemia with cerebral atrophy. 2. No acute intracranial abnormality. Electronically Signed   By:  Ashley Royalty M.D.   On: 09/04/2016 22:12   Mr Brain Wo Contrast  Result Date: 09/12/2016 CLINICAL DATA:  Altered mental status EXAM: MRI HEAD WITHOUT CONTRAST TECHNIQUE: Multiplanar, multiecho pulse sequences of the brain and surrounding structures were obtained without intravenous contrast. COMPARISON:  CT brain 09/04/2016 FINDINGS: Brain: The midline structures are normal. There is no focal diffusion restriction to indicate acute infarct. There is beginning confluent hyperintense T2-weighted signal within the periventricular white matter, most often seen in the setting of chronic microvascular ischemia. No intraparenchymal hematoma or chronic microhemorrhage. Brain volume is normal for age without age-advanced or lobar predominant atrophy. The dura is normal and there is no extra-axial collection. Vascular: Major intracranial arterial and venous sinus flow voids are preserved. Skull and upper cervical spine: The visualized skull base, calvarium, upper cervical spine and extracranial soft tissues are normal. Sinuses/Orbits: No fluid levels or advanced mucosal thickening. No mastoid effusion. 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 IMPRESSION: 1. No acute intracranial abnormality. 2. Advanced white matter disease, most commonly chronic microvascular ischemia. 3. Bilateral exophthalmos, in keeping with thyroid ophthalmopathy. Electronically Signed   By: Ulyses Jarred M.D.   On: 09/12/2016 02:47   Dg Chest Port 1 View  Result Date: 09/06/2016 CLINICAL DATA:  Respiratory failure EXAM: PORTABLE CHEST 1 VIEW COMPARISON:  09/04/2016 FINDINGS: Improvement in vascular congestion. Cardiac enlargement. Negative for edema or effusion. Mild left lower lobe airspace disease has developed since the prior study most likely atelectasis. IMPRESSION: Improving vascular congestion. Mild left lower lobe atelectasis. Electronically Signed   By: Franchot Gallo M.D.   On: 09/06/2016 07:00  Time Spent in minutes  25   Louellen Molder M.D on 09/16/2016 at 12:53 PM  Between 7am to 7pm - Pager - [phone]  After 7pm go to [email or web address] - password [facility]  [facility] -  Office  [phone]

## 2016-09-16 NOTE — Progress Notes (Signed)
  [facility] Progress Note   Subjective: Sitting up and feeling well today.  Cr still rising, IVFs started   Vitals:   09/15/16 2330 09/16/16 0008 09/16/16 0532 09/16/16 1249  BP: (!) 153/42  (!) 160/40 (!) 162/42  Pulse: (!) 52 (!) 54 (!) 52 60  Resp:   16 16  Temp:   99 F (37.2 C) 98 F (36.7 C)  TempSrc:   Oral Oral  SpO2:   96% 98%  Weight:   61.3 kg (135 lb 3.2 oz)   Height:        Inpatient medications: . amLODipine  10 mg Oral Daily  . aspirin EC  81 mg Oral Daily  . atorvastatin  40 mg Oral Daily  . brimonidine  1 drop Both Eyes BID  . brinzolamide  1 drop Both Eyes BID  . doxazosin  8 mg Oral Daily  . feeding supplement (ENSURE ENLIVE)  237 mL Oral BID BM  . heparin  5,000 Units Subcutaneous Q8H  . hydrALAZINE  100 mg Oral TID  . levothyroxine  137 mcg Oral QAC breakfast  . multivitamin with minerals  1 tablet Oral Daily   . sodium chloride 75 mL/hr at 09/16/16 1319   acetaminophen, hydrALAZINE  Exam: Gen thin, man, sitting in bed, awake, watching cartoon HEENT Sclera anicteric NECK No jvd or bruits PULM clear bilat CV RRR no MRG ABD soft ntnd no mass or ascites +bs, no scars or hsm MSK no joint effusions or deformity EXT no LE or UE edema / no wounds or ulcers Neuro is alert, Ox 1, not sure year or date, no asterixis.     Assess: 1  CKD stage IV - eGFR around 17- 20. 24 hr urine clearance with CrCl of 17.  He is followed by Dr. Justin Mend in clinic. Now has AKI which is likely hemodynamically mediated by lisinopril which has been d/c'd.  Agree with IVFs 2  AMS - psych vs dementia vs other. Doubt uremia w/ renal function as it is. Per primary.  This appears to be improving.   3  HTN urgency - Cr increased from 2.8-->3.3--> 3.9 after addition of lisinopril Sunday/ Monday.  This was stopped. Don't recommend restarting.  Continue amlodipine 10 mg daily, hydralazine 100 mg TID, and cardura 8 mg daily.   4  Hx CAD    [facility] pager [phone] 09/16/2016, 2:04 PM    Recent Labs Lab 09/14/16 0348 09/15/16 0737 09/16/16 0343  NA 134* 133* 132*  K 4.5 4.5 4.4  CL 103 104 103  CO2 24 22 21*  GLUCOSE 142* 131* 159*  BUN 77* 95* 110*  CREATININE 3.33* 3.96* 4.53*  CALCIUM 9.2 9.2 8.9   No results for input(s): AST, ALT, ALKPHOS, BILITOT, PROT, ALBUMIN in the last 168 hours. No results for input(s): WBC, NEUTROABS, HGB, HCT, MCV, PLT in the last 168 hours. Iron/TIBC/Ferritin/ %Sat No results found for: IRON, TIBC, FERRITIN, IRONPCTSAT

## 2016-09-16 NOTE — Progress Notes (Signed)
Paged MD to review U/A

## 2016-09-16 NOTE — Progress Notes (Signed)
Pt is confused more impulsive this afternoon. IV fluids started for  Renal function.

## 2016-09-17 ENCOUNTER — Inpatient Hospital Stay (HOSPITAL_COMMUNITY): Payer: Medicare Other

## 2016-09-17 DIAGNOSIS — N179 Acute kidney failure, unspecified: Secondary | ICD-10-CM

## 2016-09-17 DIAGNOSIS — G9341 Metabolic encephalopathy: Secondary | ICD-10-CM

## 2016-09-17 DIAGNOSIS — N183 Chronic kidney disease, stage 3 (moderate): Secondary | ICD-10-CM

## 2016-09-17 DIAGNOSIS — N390 Urinary tract infection, site not specified: Secondary | ICD-10-CM

## 2016-09-17 LAB — BASIC METABOLIC PANEL
Anion gap: 6 (ref 5–15)
BUN: 116 mg/dL — AB (ref 6–20)
CALCIUM: 9.2 mg/dL (ref 8.9–10.3)
CO2: 22 mmol/L (ref 22–32)
CREATININE: 4.78 mg/dL — AB (ref 0.61–1.24)
Chloride: 105 mmol/L (ref 101–111)
GFR calc Af Amer: 12 mL/min — ABNORMAL LOW (ref >60)
GFR, EST NON AFRICAN AMERICAN: 10 mL/min — AB (ref >60)
GLUCOSE: 122 mg/dL — AB (ref 65–99)
Potassium: 4.6 mmol/L (ref 3.5–5.1)
Sodium: 133 mmol/L — ABNORMAL LOW (ref 135–145)

## 2016-09-17 LAB — URINE CULTURE

## 2016-09-17 NOTE — Clinical Social Work Note (Signed)
CSW continues to follow for discharge needs. Patient now has a Actuary. This will have to be discontinued for 24 hours prior to discharge to SNF.  Jeffrey Campbell, Jeffrey Campbell

## 2016-09-17 NOTE — Progress Notes (Signed)
Safety sitter at bedside 

## 2016-09-17 NOTE — Progress Notes (Signed)
  [facility] Progress Note   Subjective: Agitated overnight.  Got ativan.  Has a Actuary.  UA with likely UTI.  Vitals:   09/16/16 1249 09/16/16 1400 09/16/16 2125 09/17/16 0623  BP: (!) 162/42 (!) 168/126 (!) 137/99 (!) 135/45  Pulse: 60  (!) 59 (!) 51  Resp: _0 Temp: 98 F (36.7 C)  98.8 F (37.1 C) 98.6 F (37 C)  TempSrc: Oral  Oral Oral  SpO2: 98%  99% 97%  Weight:    60.4 kg (133 lb 1.6 oz)  Height:        Inpatient medications: . amLODipine  10 mg Oral Daily  . aspirin EC  81 mg Oral Daily  . atorvastatin  40 mg Oral Daily  . brimonidine  1 drop Both Eyes BID  . brinzolamide  1 drop Both Eyes BID  . doxazosin  8 mg Oral Daily  . feeding supplement (ENSURE ENLIVE)  237 mL Oral BID BM  . heparin  5,000 Units Subcutaneous Q8H  . hydrALAZINE  100 mg Oral TID  . levothyroxine  137 mcg Oral QAC breakfast  . multivitamin with minerals  1 tablet Oral Daily   . sodium chloride 75 mL/hr at 09/17/16 0238  . cefTRIAXone (ROCEPHIN)  IV Stopped (09/16/16 2032)   acetaminophen, hydrALAZINE, LORazepam  Exam: Gen thin, man, lying in bed, sleeping HEENT Sclera anicteric NECK 2 cm JVD PULM clear bilat CV RRR no MRG ABD soft ntnd no mass or ascites +bs, no scars or hsm MSK no joint effusions or deformity EXT no LE or UE edema / no wounds or ulcers Neuro is alert, Ox 1, not sure year or date, no asterixis.     Assess: 1  CKD stage IV - eGFR around 17- 20. 24 hr urine clearance with CrCl of 17.  He is followed by Dr. Justin Mend in clinic. Now has AKI which is likely hemodynamically mediated by lisinopril which has been d/c'd.  Agree with IVFs x 24 hours; creatinine is plateauing, would not restart since he has a little JVD today.  2  Acute encephalopathy: was initially improving but appears to have UTI now which is a contributor too.  3  HTN urgency - Cr increased from 2.8-->4.8 after addition of lisinopril Sunday/ Monday.  This was stopped. Don't  recommend restarting.  Continue amlodipine 10 mg daily, hydralazine 100 mg TID, and cardura 8 mg daily.    4  Hx CAD    Jeffrey Campbell pager [phone] 09/17/2016, 1:12 PM    Recent Labs Lab 09/15/16 0737 09/16/16 0343 09/17/16 0415  NA 133* 132* 133*  K 4.5 4.4 4.6  CL 104 103 105  CO2 22 21* 22  GLUCOSE 131* 159* 122*  BUN 95* 110* 116*  CREATININE 3.96* 4.53* 4.78*  CALCIUM 9.2 8.9 9.2   No results for input(s): AST, ALT, ALKPHOS, BILITOT, PROT, ALBUMIN in the last 168 hours. No results for input(s): WBC, NEUTROABS, HGB, HCT, MCV, PLT in the last 168 hours. Iron/TIBC/Ferritin/ %Sat No results found for: IRON, TIBC, FERRITIN, IRONPCTSAT

## 2016-09-17 NOTE — Progress Notes (Signed)
Occupational Therapy Treatment Patient Details Name: Jeffrey Campbell MRN: [id number] DOB: [birth year] Today's Date: 09/17/2016    History of present illness Pt adm with hypertensive emergency and acute encephalopathy. Increased confusion 09/16/16 with UTI. PMH - DM, CAD, CKD, etoh abuse, diastolic dysfunction   OT comments  Pt with increased confusion last night requiring safety sitter. Pt sleeping upon OTs arrival, requiring more assistance for dressing than upon evaluation. Ambulated with min assist during ADL. Continue to recommend SNF.  Follow Up Recommendations  SNF    Equipment Recommendations       Recommendations for Other Services      Precautions / Restrictions Precautions Precautions: Fall       Mobility Bed Mobility Overal bed mobility: Needs Assistance Bed Mobility: Supine to Sit     Supine to sit: Min assist     General bed mobility comments: cues for technique, min assist to raise trunk  Transfers Overall transfer level: Needs assistance Equipment used: Rolling walker (2 wheeled) Transfers: Sit to/from Stand Sit to Stand: Min assist         General transfer comment: steadying assist from bed    Balance Overall balance assessment: Needs assistance   Sitting balance-Leahy Scale: Fair       Standing balance-Leahy Scale: Poor Standing balance comment: leans on sink when releasing walker in standing                           ADL either performed or assessed with clinical judgement   ADL Overall ADL's : Needs assistance/impaired     Grooming: Minimal assistance;Wash/dry hands;Standing           Upper Body Dressing : Moderate assistance;Sitting   Lower Body Dressing: Maximal assistance;Bed level   Toilet Transfer: Minimal assistance;Ambulation;RW   Toileting- Clothing Manipulation and Hygiene: Minimal assistance;Sit to/from stand       Functional mobility during ADLs: Minimal assistance;Rolling walker General ADL Comments:  walked pt from his room to his new      Vision       Perception     Praxis      Cognition Arousal/Alertness: Awake/alert Behavior During Therapy: WFL for tasks assessed/performed Overall Cognitive Status: Impaired/Different from baseline Area of Impairment: Orientation;Attention;Memory;Following commands;Safety/judgement;Problem solving                 Orientation Level: Disoriented to;Place;Situation Current Attention Level: Sustained Memory: Decreased recall of precautions;Decreased short-term memory Following Commands: Follows one step commands with increased time Safety/Judgement: Decreased awareness of safety;Decreased awareness of deficits   Problem Solving: Slow processing;Decreased initiation;Requires verbal cues;Requires tactile cues;Difficulty sequencing General Comments: cues for safety, multimodal cues for mobility        Exercises     Shoulder Instructions       General Comments      Pertinent Vitals/ Pain       Pain Assessment: Faces Faces Pain Scale: No hurt  Home Living                                          Prior Functioning/Environment              Frequency  Min 2X/week        Progress Toward Goals  OT Goals(current goals can now be found in the care plan section)  Progress towards OT goals: Not progressing toward goals -  comment (impaired cognition)  Acute Rehab OT Goals Patient Stated Goal: Not stated OT Goal Formulation: With patient Time For Goal Achievement: 09/27/16 Potential to Achieve Goals: Good  Plan Discharge plan remains appropriate    Co-evaluation                 AM-PAC PT "6 Clicks" Daily Activity     Outcome Measure   Help from another person eating meals?: A Little Help from another person taking care of personal grooming?: A Little Help from another person toileting, which includes using toliet, bedpan, or urinal?: A Little Help from another person bathing (including  washing, rinsing, drying)?: A Lot Help from another person to put on and taking off regular upper body clothing?: A Lot Help from another person to put on and taking off regular lower body clothing?: A Lot 6 Click Score: 15    End of Session Equipment Utilized During Treatment: Gait belt;Rolling walker  OT Visit Diagnosis: Unsteadiness on feet (R26.81);Other symptoms and signs involving cognitive function   Activity Tolerance Patient tolerated treatment well   Patient Left in chair;with call bell/phone within reach;with chair alarm set;with nursing/sitter in room   Nurse Communication          Time: 8257-4935 OT Time Calculation (min): 28 min  Charges: OT General Charges $OT Visit: 1 Procedure OT Treatments $Self Care/Home Management : 23-37 mins     Malka So 09/17/2016, 2:54 PM  [phone]

## 2016-09-17 NOTE — Progress Notes (Signed)
Patient family here to visit.Will continue to monitor.

## 2016-09-17 NOTE — Progress Notes (Signed)
PROGRESS NOTE                                                                                                                                                                                                             Patient Demographics:    Jeffrey Campbell, is a 83 y.o. male, DOB - 02-02-1933, ZES:[id number]  Admit date - 09/04/2016   Admitting Physician Luz Brazen, MD  Outpatient Primary MD for the patient is [name], MD  LOS - 12  Outpatient Specialists: Dr Justin Mend  Chief Complaint  Patient presents with  . Altered Mental Status       Brief Narrative   83 year old male with history of diastolic CHF, coronary artery disease, carotid stenosis, pulmonary hypertension, OSA, chronic kidney disease stage III and diabetes mellitus who presented with acute encephalopathy and severe hypertension. He was also bradycardic in the 40s. Patient was admitted to ICU. 2-D echo showed normal EF with grade 1 diastolic dysfunction and severely dilated LA . MRI of the brain was negative for acute stroke and showed chronic microvascular ischemia. Patient transferred to stepdown and then to telemetry. Nephrology consulted.    Subjective:   Confused, only oriented to self, no agitation, sitter in room   Assessment  & Plan :   Hypertensive emergency -Blood pressure has been stable. Clonidine discontinued due to sinus bradycardia. Currently on amlodipine, Cardura and hydralazine. Lisinopril was added by nephrology but discontinued due to worsened renal function.   Acute on Chronic kidney disease stage IV Nephrology following.  Function continues to worsen (4. 53) . ACE inhibitor has been discontinued.  Ua+ uti, on rocephin, culture pending  Acute metabolic encephalopathy Suspected due to hypertensive encephalopathy. And uti contributing  Junction bradycardia Appears chronic and is followed by cardiology as outpatient. Heart  rate stable in low 50s today.  Hypothyroidism TSH normal. Continue Synthroid.  COPD Compensated.  OSA Patient reportedly has been intolerant to CPAP.  Pulmonary hypertension Has had extensive workup by heart failure team.      Code Status : full code  Family Communication  : None at bedside  Disposition Plan  : SNF once his renal function improved, awaiting for urine culture  Barriers For Discharge : Acute kidney injury    Consults  :  Nephrology  Procedures  : MRI brain  DVT Prophylaxis  :  Lovenox -   Lab Results  Component Value Date   PLT 142 (L) 09/09/2016    Antibiotics  :    Anti-infectives    Start     Dose/Rate Route Frequency Ordered Stop   09/16/16 2000  cefTRIAXone (ROCEPHIN) 1 g in dextrose 5 % 50 mL IVPB     1 g 100 mL/hr over 30 Minutes Intravenous Every 24 hours 09/16/16 1907     09/16/16 1915  cefTRIAXone (ROCEPHIN) injection 1 g  Status:  Discontinued     1 g Intramuscular Every 24 hours 09/16/16 1902 09/16/16 1906        Objective:   Vitals:   09/16/16 1249 09/16/16 1400 09/16/16 2125 09/17/16 0623  BP: (!) 162/42 (!) 168/126 (!) 137/99 (!) 135/45  Pulse: 60  (!) 59 (!) 51  Resp: 16  18 18   Temp: 98 F (36.7 C)  98.8 F (37.1 C) 98.6 F (37 C)  TempSrc: Oral  Oral Oral  SpO2: 98%  99% 97%  Weight:    60.4 kg (133 lb 1.6 oz)  Height:        Wt Readings from Last 3 Encounters:  09/17/16 60.4 kg (133 lb 1.6 oz)  07/28/16 62.6 kg (138 lb)  07/21/16 63 kg (138 lb 12.8 oz)     Intake/Output Summary (Last 24 hours) at 09/17/16 1000 Last data filed at 09/17/16 0858  Gross per 24 hour  Intake          1358.75 ml  Output              720 ml  Net           638.75 ml     Physical Exam Gen.: frail Elderly male not in distress, confused, oriented to self only, no agitation during encounter HEENT: Moist mucosa, supple neck Chest: Clear to auscultation bilaterally CVS: Normal S1 and S2, no murmurs,  GI: Soft, nondistended,  nontender Musculoskeletal: Warm, no edema  CNS alert and oriented 2-3      Data Review:    CBC No results for input(s): WBC, HGB, HCT, PLT, MCV, MCH, MCHC, RDW, LYMPHSABS, MONOABS, EOSABS, BASOSABS, BANDABS in the last 168 hours.  Invalid input(s): NEUTRABS, BANDSABD  Chemistries   Recent Labs Lab 09/11/16 0304 09/12/16 0320 09/13/16 0318 09/14/16 0348 09/15/16 0737 09/16/16 0343 09/17/16 0415  NA 136 135 137 134* 133* 132* 133*  K 4.0 4.2 4.6 4.5 4.5 4.4 4.6  CL 103 104 105 103 104 103 105  CO2 25 24 24 24 22  21* 22  GLUCOSE 106* 100* 145* 142* 131* 159* 122*  BUN 66* 60* 60* 77* 95* 110* 116*  CREATININE 3.00* 2.91* 2.89* 3.33* 3.96* 4.53* 4.78*  CALCIUM 9.2 9.2 9.8 9.2 9.2 8.9 9.2  MG 2.3 2.4 2.4  --   --   --   --    ------------------------------------------------------------------------------------------------------------------ No results for input(s): CHOL, HDL, LDLCALC, TRIG, CHOLHDL, LDLDIRECT in the last 72 hours.  No results found for: HGBA1C ------------------------------------------------------------------------------------------------------------------ No results for input(s): TSH, T4TOTAL, T3FREE, THYROIDAB in the last 72 hours.  Invalid input(s): FREET3 ------------------------------------------------------------------------------------------------------------------ No results for input(s): VITAMINB12, FOLATE, FERRITIN, TIBC, IRON, RETICCTPCT in the last 72 hours.  Coagulation profile No results for input(s): INR, PROTIME in the last 168 hours.  No results for input(s): DDIMER in the last 72 hours.  Cardiac Enzymes No results for input(s): CKMB, TROPONINI, MYOGLOBIN in the last 168 hours.  Invalid input(s): CK ------------------------------------------------------------------------------------------------------------------    Component Value  Date/Time   BNP 801.8 (H) 03/03/2015 1215   BNP 620.8 (H) 01/23/2015 0944    Inpatient  Medications  Scheduled Meds: . amLODipine  10 mg Oral Daily  . aspirin EC  81 mg Oral Daily  . atorvastatin  40 mg Oral Daily  . brimonidine  1 drop Both Eyes BID  . brinzolamide  1 drop Both Eyes BID  . doxazosin  8 mg Oral Daily  . feeding supplement (ENSURE ENLIVE)  237 mL Oral BID BM  . heparin  5,000 Units Subcutaneous Q8H  . hydrALAZINE  100 mg Oral TID  . levothyroxine  137 mcg Oral QAC breakfast  . multivitamin with minerals  1 tablet Oral Daily   Continuous Infusions: . sodium chloride 75 mL/hr at 09/17/16 0238  . cefTRIAXone (ROCEPHIN)  IV Stopped (09/16/16 2032)   PRN Meds:.acetaminophen, hydrALAZINE, LORazepam  Micro Results No results found for this or any previous visit (from the past 240 hour(s)).  Radiology Reports Dg Chest 2 View  Result Date: 09/04/2016 CLINICAL DATA:  Unsteady while standing. Cough and altered mental status. EXAM: CHEST  2 VIEW COMPARISON:  01/28/2015 CXR, chest CT 05/12/2016 FINDINGS: Stable cardiomegaly with aortic atherosclerosis. Mild central vascular congestion. No pneumonic consolidation, effusion or pneumothorax. No dominant mass is seen. No acute nor suspicious osseous abnormality. IMPRESSION: Cardiomegaly with aortic atherosclerosis. Mild vascular congestion since prior exam is noted. Electronically Signed   By: Ashley Royalty M.D.   On: 09/04/2016 22:42   Ct Head Wo Contrast  Result Date: 09/04/2016 CLINICAL DATA:  Altered mental status EXAM: CT HEAD WITHOUT CONTRAST TECHNIQUE: Contiguous axial images were obtained from the base of the skull through the vertex without intravenous contrast. COMPARISON:  None. FINDINGS: BRAIN: There is sulcal and ventricular prominence consistent with superficial and central atrophy. No intraparenchymal hemorrhage, mass effect nor midline shift. Periventricular and subcortical white matter hypodensities consistent with chronic small vessel ischemic disease are identified. No acute large vascular territory  infarcts. No abnormal extra-axial fluid collections. Basal cisterns are not effaced and midline. VASCULAR: Moderate calcific atherosclerosis of the carotid siphons. SKULL: No skull fracture. No significant scalp soft tissue swelling. SINUSES/ORBITS: The mastoid air-cells are clear. The included paranasal sinuses are well-aerated.The included ocular globes and orbital contents are non-suspicious. OTHER: None. IMPRESSION: 1. Chronic moderate degree small vessel ischemia with cerebral atrophy. 2. No acute intracranial abnormality. Electronically Signed   By: Ashley Royalty M.D.   On: 09/04/2016 22:12   Mr Brain Wo Contrast  Result Date: 09/12/2016 CLINICAL DATA:  Altered mental status EXAM: MRI HEAD WITHOUT CONTRAST TECHNIQUE: Multiplanar, multiecho pulse sequences of the brain and surrounding structures were obtained without intravenous contrast. COMPARISON:  CT brain 09/04/2016 FINDINGS: Brain: The midline structures are normal. There is no focal diffusion restriction to indicate acute infarct. There is beginning confluent hyperintense T2-weighted signal within the periventricular white matter, most often seen in the setting of chronic microvascular ischemia. No intraparenchymal hematoma or chronic microhemorrhage. Brain volume is normal for age without age-advanced or lobar predominant atrophy. The dura is normal and there is no extra-axial collection. Vascular: Major intracranial arterial and venous sinus flow voids are preserved. Skull and upper cervical spine: The visualized skull base, calvarium, upper cervical spine and extracranial soft tissues are normal. Sinuses/Orbits: No fluid levels or advanced mucosal thickening. No mastoid effusion. 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 IMPRESSION: 1. No acute intracranial abnormality. 2. Advanced white matter disease, most commonly chronic microvascular ischemia. 3. Bilateral exophthalmos, in  keeping with thyroid ophthalmopathy.  Electronically Signed   By: Ulyses Jarred M.D.   On: 09/12/2016 02:47   Dg Chest Port 1 View  Result Date: 09/06/2016 CLINICAL DATA:  Respiratory failure EXAM: PORTABLE CHEST 1 VIEW COMPARISON:  09/04/2016 FINDINGS: Improvement in vascular congestion. Cardiac enlargement. Negative for edema or effusion. Mild left lower lobe airspace disease has developed since the prior study most likely atelectasis. IMPRESSION: Improving vascular congestion. Mild left lower lobe atelectasis. Electronically Signed   By: Franchot Gallo M.D.   On: 09/06/2016 07:00    Time Spent in minutes  25   Harriet Sutphen M.D PhD  on 09/17/2016 at 10:00 AM  Between 7am to 7pm - Pager - [phone]  After 7pm go to [email or web address] - password [facility]  [facility] -  Office  [phone]

## 2016-09-17 NOTE — Progress Notes (Addendum)
Dr Myna Hidalgo text paged to inform him that patient family is requesting for medication to calm patient down. Patient already received 1mg  of Hadol IV at 20:00 which did not help.  Family  currently at bedside.

## 2016-09-17 NOTE — Progress Notes (Signed)
Call back received from Dr Myna Hidalgo to give 1mg  of IV Ativan now.

## 2016-09-17 NOTE — Progress Notes (Signed)
During shift change report patient frequently setting chair alarm off.Patient confused thinks he is at church and trying to leave to go home. Per shift report patient has become more confused as the day progressed.Attempt to reorient patient however unsuccessful.Patient unsteady on feet and concerned about safety.Call placed to patient wife to see if family available to come sit with patient.Text paged Dr Myna Hidalgo.

## 2016-09-17 NOTE — Progress Notes (Signed)
Dr.Opyd in room to assess patient.New orders received and carried out.

## 2016-09-18 DIAGNOSIS — R001 Bradycardia, unspecified: Secondary | ICD-10-CM

## 2016-09-18 LAB — BASIC METABOLIC PANEL
ANION GAP: 8 (ref 5–15)
BUN: 124 mg/dL — ABNORMAL HIGH (ref 6–20)
CALCIUM: 9.1 mg/dL (ref 8.9–10.3)
CO2: 20 mmol/L — ABNORMAL LOW (ref 22–32)
Chloride: 106 mmol/L (ref 101–111)
Creatinine, Ser: 4.91 mg/dL — ABNORMAL HIGH (ref 0.61–1.24)
GFR, EST AFRICAN AMERICAN: 12 mL/min — AB (ref >60)
GFR, EST NON AFRICAN AMERICAN: 10 mL/min — AB (ref >60)
Glucose, Bld: 153 mg/dL — ABNORMAL HIGH (ref 65–99)
POTASSIUM: 4.9 mmol/L (ref 3.5–5.1)
Sodium: 134 mmol/L — ABNORMAL LOW (ref 135–145)

## 2016-09-18 LAB — CBC
HEMATOCRIT: 26.9 % — AB (ref 39.0–52.0)
Hemoglobin: 8.9 g/dL — ABNORMAL LOW (ref 13.0–17.0)
MCH: 30.5 pg (ref 26.0–34.0)
MCHC: 33.1 g/dL (ref 30.0–36.0)
MCV: 92.1 fL (ref 78.0–100.0)
PLATELETS: 252 10*3/uL (ref 150–400)
RBC: 2.92 MIL/uL — AB (ref 4.22–5.81)
RDW: 15.2 % (ref 11.5–15.5)
WBC: 10 10*3/uL (ref 4.0–10.5)

## 2016-09-18 MED ORDER — AMLODIPINE BESYLATE 5 MG PO TABS
5.0000 mg | ORAL_TABLET | Freq: Every day | ORAL | Status: DC
  Administered 2016-09-19: 5 mg via ORAL
  Filled 2016-09-18: qty 1

## 2016-09-18 NOTE — Progress Notes (Signed)
PROGRESS NOTE                                                                                                                                                                                                             Patient Demographics:    Jeffrey Campbell, is a 83 y.o. male, DOB - 04/18/1932, QPR:[id number]  Admit date - 09/04/2016   Admitting Physician Luz Brazen, MD  Outpatient Primary MD for the patient is [name], MD  LOS - 13  Outpatient Specialists: Dr Justin Mend  Chief Complaint  Patient presents with  . Altered Mental Status       Brief Narrative   83 year old male with history of diastolic CHF, coronary artery disease, carotid stenosis, pulmonary hypertension, OSA, chronic kidney disease stage III and diabetes mellitus who presented with acute encephalopathy and severe hypertension. He was also bradycardic in the 40s. Patient was admitted to ICU. 2-D echo showed normal EF with grade 1 diastolic dysfunction and severely dilated LA . MRI of the brain was negative for acute stroke and showed chronic microvascular ischemia. Patient transferred to stepdown and then to telemetry. Nephrology consulted.    Subjective:   Seems much better today, he is not oriented to month but to year, place and person (while yesterday he is only oriented to self) No agitation, sitter at bedside. Son at bedside   Assessment  & Plan :   Hypertensive emergency -Blood pressure has been stable. Clonidine discontinued due to sinus bradycardia. -Currently on amlodipine, Cardura and hydralazine. --Lisinopril was added by nephrology but discontinued due to worsened renal function.   Acute on CKD IV -Function continues to worsen (4. 53) . ACE inhibitor has been discontinued.  -Ua+ uti, urine culture with multiple species  -he is started on rocephin will continue due to clinical improvement in mental status, seems to have temporal  correlation to starting abx -cr not improving,renal US" Increased echogenicity of both kidneys consistent with medical renal disease. No findings for hydronephrosis. Mild diffuse bladder wall thickening." -Nephrology following. Input appreciated.  Acute metabolic encephalopathy Suspected due to hypertensive encephalopathy. And uti contributing Much improved on 6/23, d/c sitter  Junction bradycardia Appears chronic and is followed by cardiology as outpatient. Heart rate stable in low 50s today. Does not seem to be symptomatic, bp stable, denies chest discomfort  Hypothyroidism TSH normal. Continue  Synthroid.  COPD Compensated.  OSA Patient reportedly has been intolerant to CPAP.  Pulmonary hypertension Has had extensive workup by heart failure team.    Code Status : full code  Family Communication  : None at bedside  Disposition Plan  : SNF once his renal function improved, need clearance by nephrology, need to be of sitter for 24hrs   Consults  :  Nephrology  Procedures  : MRI brain  DVT Prophylaxis  :  Lovenox -   Lab Results  Component Value Date   PLT 252 09/18/2016    Antibiotics  :    Anti-infectives    Start     Dose/Rate Route Frequency Ordered Stop   09/16/16 2000  cefTRIAXone (ROCEPHIN) 1 g in dextrose 5 % 50 mL IVPB     1 g 100 mL/hr over 30 Minutes Intravenous Every 24 hours 09/16/16 1907     09/16/16 1915  cefTRIAXone (ROCEPHIN) injection 1 g  Status:  Discontinued     1 g Intramuscular Every 24 hours 09/16/16 1902 09/16/16 1906        Objective:   Vitals:   09/17/16 2047 09/17/16 2217 09/18/16 0300 09/18/16 0929  BP: (!) 144/104 (!) 144/62 (!) 144/47 130/60  Pulse: (!) 46  (!) 43   Resp: 18  17   Temp: 97.9 F (36.6 C)  98.6 F (37 C)   TempSrc: Oral  Oral   SpO2: 100%  97%   Weight:   62.8 kg (138 lb 6.4 oz)   Height:        Wt Readings from Last 3 Encounters:  09/18/16 62.8 kg (138 lb 6.4 oz)  07/28/16 62.6 kg (138 lb)    07/21/16 63 kg (138 lb 12.8 oz)     Intake/Output Summary (Last 24 hours) at 09/18/16 1508 Last data filed at 09/18/16 1235  Gross per 24 hour  Intake              580 ml  Output              450 ml  Net              130 ml     Physical Exam Gen.: frail Elderly male not in distress, looks much better, today he could not tell me the months, but he is oriented to year, place and person, no agitation during encounter HEENT: Moist mucosa, supple neck Chest: Clear to auscultation bilaterally CVS: Normal S1 and S2, no murmurs,  GI: Soft, nondistended, nontender Musculoskeletal: Warm, no edema  CNS alert and oriented 2-3      Data Review:    CBC  Recent Labs Lab 09/18/16 0905  WBC 10.0  HGB 8.9*  HCT 26.9*  PLT 252  MCV 92.1  MCH 30.5  MCHC 33.1  RDW 15.2    Chemistries   Recent Labs Lab 09/12/16 0320 09/13/16 0318 09/14/16 0348 09/15/16 0737 09/16/16 0343 09/17/16 0415 09/18/16 0905  NA 135 137 134* 133* 132* 133* 134*  K 4.2 4.6 4.5 4.5 4.4 4.6 4.9  CL 104 105 103 104 103 105 106  CO2 24 24 24 22  21* 22 20*  GLUCOSE 100* 145* 142* 131* 159* 122* 153*  BUN 60* 60* 77* 95* 110* 116* 124*  CREATININE 2.91* 2.89* 3.33* 3.96* 4.53* 4.78* 4.91*  CALCIUM 9.2 9.8 9.2 9.2 8.9 9.2 9.1  MG 2.4 2.4  --   --   --   --   --    ------------------------------------------------------------------------------------------------------------------  No results for input(s): CHOL, HDL, LDLCALC, TRIG, CHOLHDL, LDLDIRECT in the last 72 hours.  No results found for: HGBA1C ------------------------------------------------------------------------------------------------------------------ No results for input(s): TSH, T4TOTAL, T3FREE, THYROIDAB in the last 72 hours.  Invalid input(s): FREET3 ------------------------------------------------------------------------------------------------------------------ No results for input(s): VITAMINB12, FOLATE, FERRITIN, TIBC, IRON,  RETICCTPCT in the last 72 hours.  Coagulation profile No results for input(s): INR, PROTIME in the last 168 hours.  No results for input(s): DDIMER in the last 72 hours.  Cardiac Enzymes No results for input(s): CKMB, TROPONINI, MYOGLOBIN in the last 168 hours.  Invalid input(s): CK ------------------------------------------------------------------------------------------------------------------    Component Value Date/Time   BNP 801.8 (H) 03/03/2015 1215   BNP 620.8 (H) 01/23/2015 0944    Inpatient Medications  Scheduled Meds: . [START ON 09/19/2016] amLODipine  5 mg Oral Daily  . aspirin EC  81 mg Oral Daily  . atorvastatin  40 mg Oral Daily  . brimonidine  1 drop Both Eyes BID  . brinzolamide  1 drop Both Eyes BID  . doxazosin  8 mg Oral Daily  . feeding supplement (ENSURE ENLIVE)  237 mL Oral BID BM  . heparin  5,000 Units Subcutaneous Q8H  . hydrALAZINE  100 mg Oral TID  . levothyroxine  137 mcg Oral QAC breakfast  . multivitamin with minerals  1 tablet Oral Daily   Continuous Infusions: . cefTRIAXone (ROCEPHIN)  IV Stopped (09/17/16 2121)   PRN Meds:.acetaminophen, hydrALAZINE, LORazepam  Micro Results Recent Results (from the past 240 hour(s))  Culture, Urine     Status: Abnormal   Collection Time: 09/16/16  6:21 PM  Result Value Ref Range Status   Specimen Description URINE, RANDOM  Final   Special Requests NONE  Final   Culture MULTIPLE SPECIES PRESENT, SUGGEST RECOLLECTION (A)  Final   Report Status 09/17/2016 FINAL  Final    Radiology Reports Dg Chest 2 View  Result Date: 09/04/2016 CLINICAL DATA:  Unsteady while standing. Cough and altered mental status. EXAM: CHEST  2 VIEW COMPARISON:  01/28/2015 CXR, chest CT 05/12/2016 FINDINGS: Stable cardiomegaly with aortic atherosclerosis. Mild central vascular congestion. No pneumonic consolidation, effusion or pneumothorax. No dominant mass is seen. No acute nor suspicious osseous abnormality. IMPRESSION:  Cardiomegaly with aortic atherosclerosis. Mild vascular congestion since prior exam is noted. Electronically Signed   By: Ashley Royalty M.D.   On: 09/04/2016 22:42   Ct Head Wo Contrast  Result Date: 09/04/2016 CLINICAL DATA:  Altered mental status EXAM: CT HEAD WITHOUT CONTRAST TECHNIQUE: Contiguous axial images were obtained from the base of the skull through the vertex without intravenous contrast. COMPARISON:  None. FINDINGS: BRAIN: There is sulcal and ventricular prominence consistent with superficial and central atrophy. No intraparenchymal hemorrhage, mass effect nor midline shift. Periventricular and subcortical white matter hypodensities consistent with chronic small vessel ischemic disease are identified. No acute large vascular territory infarcts. No abnormal extra-axial fluid collections. Basal cisterns are not effaced and midline. VASCULAR: Moderate calcific atherosclerosis of the carotid siphons. SKULL: No skull fracture. No significant scalp soft tissue swelling. SINUSES/ORBITS: The mastoid air-cells are clear. The included paranasal sinuses are well-aerated.The included ocular globes and orbital contents are non-suspicious. OTHER: None. IMPRESSION: 1. Chronic moderate degree small vessel ischemia with cerebral atrophy. 2. No acute intracranial abnormality. Electronically Signed   By: Ashley Royalty M.D.   On: 09/04/2016 22:12   Mr Brain Wo Contrast  Result Date: 09/12/2016 CLINICAL DATA:  Altered mental status EXAM: MRI HEAD WITHOUT CONTRAST TECHNIQUE: Multiplanar, multiecho pulse sequences of the  brain and surrounding structures were obtained without intravenous contrast. COMPARISON:  CT brain 09/04/2016 FINDINGS: Brain: The midline structures are normal. There is no focal diffusion restriction to indicate acute infarct. There is beginning confluent hyperintense T2-weighted signal within the periventricular white matter, most often seen in the setting of chronic microvascular ischemia. No  intraparenchymal hematoma or chronic microhemorrhage. Brain volume is normal for age without age-advanced or lobar predominant atrophy. The dura is normal and there is no extra-axial collection. Vascular: Major intracranial arterial and venous sinus flow voids are preserved. Skull and upper cervical spine: The visualized skull base, calvarium, upper cervical spine and extracranial soft tissues are normal. Sinuses/Orbits: No fluid levels or advanced mucosal thickening. No mastoid effusion. 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 IMPRESSION: 1. No acute intracranial abnormality. 2. Advanced white matter disease, most commonly chronic microvascular ischemia. 3. Bilateral exophthalmos, in keeping with thyroid ophthalmopathy. Electronically Signed   By: Ulyses Jarred M.D.   On: 09/12/2016 02:47   US Renal  Result Date: 09/17/2016 EXAM: RENAL / URINARY TRACT ULTRASOUND COMPLETE COMPARISON:  Ultrasound 05/05/2016 FINDINGS: Right Kidney: Length: 3.6 cm. Diffuse increased echogenicity consistent with medical renal disease. Relatively normal cortical thickness and no hydronephrosis. Left Kidney: Length: 9.4 cm. Increased echogenicity suggesting medical renal disease. Normal renal cortical thickness and no hydronephrosis. Small renal cysts are stable. Bladder: Mild diffuse bladder wall thickening and some bladder debris. No mass. Other: Bilateral pleural effusions are noted. IMPRESSION: Increased echogenicity of both kidneys consistent with medical renal disease. No findings for hydronephrosis. Mild diffuse bladder wall thickening. Electronically Signed   By: Marijo Sanes M.D.   On: 09/17/2016 16:41   Dg Chest Port 1 View  Result Date: 09/06/2016 CLINICAL DATA:  Respiratory failure EXAM: PORTABLE CHEST 1 VIEW COMPARISON:  09/04/2016 FINDINGS: Improvement in vascular congestion. Cardiac enlargement. Negative for edema or effusion. Mild left lower lobe airspace disease has developed  since the prior study most likely atelectasis. IMPRESSION: Improving vascular congestion. Mild left lower lobe atelectasis. Electronically Signed   By: Franchot Gallo M.D.   On: 09/06/2016 07:00    Time Spent in minutes  78   Henson Fraticelli M.D PhD  on 09/18/2016 at 3:08 PM  Between 7am to 7pm - Pager - [phone]  After 7pm go to [email or web address] - password [email or web address]  [facility] -  Office  [phone]

## 2016-09-18 NOTE — Progress Notes (Signed)
Pt refuses CPAP 

## 2016-09-18 NOTE — Progress Notes (Signed)
  Jeffrey Campbell Progress Note   Subjective: Still has a Actuary.  Cr looks like it's plateauing.  Urine culture with multiple species  Vitals:   09/17/16 2047 09/17/16 2217 09/18/16 0300 09/18/16 0929  BP: (!) 144/104 (!) 144/62 (!) 144/47 130/60  Pulse: (!) 46  (!) 43   Resp: 18  17   Temp: 97.9 F (36.6 C)  98.6 F (37 C)   TempSrc: Oral  Oral   SpO2: 100%  97%   Weight:   62.8 kg (138 lb 6.4 oz)   Height:        Inpatient medications: . amLODipine  10 mg Oral Daily  . aspirin EC  81 mg Oral Daily  . atorvastatin  40 mg Oral Daily  . brimonidine  1 drop Both Eyes BID  . brinzolamide  1 drop Both Eyes BID  . doxazosin  8 mg Oral Daily  . feeding supplement (ENSURE ENLIVE)  237 mL Oral BID BM  . heparin  5,000 Units Subcutaneous Q8H  . hydrALAZINE  100 mg Oral TID  . levothyroxine  137 mcg Oral QAC breakfast  . multivitamin with minerals  1 tablet Oral Daily   . cefTRIAXone (ROCEPHIN)  IV Stopped (09/17/16 2121)   acetaminophen, hydrALAZINE, LORazepam  Exam: Gen thin, man, lying in bed, sleeping and easily arousable HEENT Sclera anicteric NECK 2 cm JVD PULM clear bilat CV RRR no MRG ABD soft ntnd no mass or ascites +bs, no scars or hsm MSK no joint effusions or deformity EXT no LE or UE edema / no wounds or ulcers Neuro is alert, Ox 1, not sure year or date, no asterixis.     Assess: 1  CKD stage IV - eGFR around 17- 20. 24 hr urine clearance with CrCl of 17 earlier this hospitalization.  He is followed by Dr. Justin Campbell in clinic. Now has AKI which is likely hemodynamically mediated by lisinopril which has been d/c'd.  His diastolic BP is still a little on the low side.  Decreasing amlodipine to 5 mg daily.  2  Acute encephalopathy: was initially improving but appears to have UTI now which is a contributor too.  3  HTN urgency - Cr increased from 2.8-->4.8 after addition of lisinopril Sunday/ Monday.  This was stopped. Don't recommend restarting.  Dec  amlodipine to 5 mg daily, hydralazine 100 mg TID, and cardura 8 mg daily.    4  Possible UTI: urine culture with multiple species; on CTX (6/21).  5.  Bradycardia: Has a known junctional bradycardia, no BB, agree with d/cing aricept.    6.  Will need a SNF  Jeffrey Lips MD [place] pager [phone] 09/18/2016, 10:47 AM    Recent Labs Lab 09/16/16 0343 09/17/16 0415 09/18/16 0905  NA 132* 133* 134*  K 4.4 4.6 4.9  CL 103 105 106  CO2 21* 22 20*  GLUCOSE 159* 122* 153*  BUN 110* 116* 124*  CREATININE 4.53* 4.78* 4.91*  CALCIUM 8.9 9.2 9.1   No results for input(s): AST, ALT, ALKPHOS, BILITOT, PROT, ALBUMIN in the last 168 hours.  Recent Labs Lab 09/18/16 0905  WBC 10.0  HGB 8.9*  HCT 26.9*  MCV 92.1  PLT 252   Iron/TIBC/Ferritin/ %Sat No results found for: IRON, TIBC, FERRITIN, IRONPCTSAT

## 2016-09-19 DIAGNOSIS — F05 Delirium due to known physiological condition: Secondary | ICD-10-CM

## 2016-09-19 LAB — CBC
HCT: 26.7 % — ABNORMAL LOW (ref 39.0–52.0)
HEMOGLOBIN: 8.8 g/dL — AB (ref 13.0–17.0)
MCH: 30.7 pg (ref 26.0–34.0)
MCHC: 33 g/dL (ref 30.0–36.0)
MCV: 93 fL (ref 78.0–100.0)
PLATELETS: 260 10*3/uL (ref 150–400)
RBC: 2.87 MIL/uL — AB (ref 4.22–5.81)
RDW: 15.1 % (ref 11.5–15.5)
WBC: 8.5 10*3/uL (ref 4.0–10.5)

## 2016-09-19 LAB — BASIC METABOLIC PANEL
ANION GAP: 6 (ref 5–15)
BUN: 131 mg/dL — AB (ref 6–20)
CHLORIDE: 108 mmol/L (ref 101–111)
CO2: 22 mmol/L (ref 22–32)
Calcium: 9 mg/dL (ref 8.9–10.3)
Creatinine, Ser: 5.16 mg/dL — ABNORMAL HIGH (ref 0.61–1.24)
GFR calc Af Amer: 11 mL/min — ABNORMAL LOW (ref >60)
GFR, EST NON AFRICAN AMERICAN: 9 mL/min — AB (ref >60)
Glucose, Bld: 117 mg/dL — ABNORMAL HIGH (ref 65–99)
POTASSIUM: 4.9 mmol/L (ref 3.5–5.1)
SODIUM: 136 mmol/L (ref 135–145)

## 2016-09-19 MED ORDER — DOXAZOSIN MESYLATE 8 MG PO TABS
8.0000 mg | ORAL_TABLET | Freq: Every day | ORAL | Status: DC
Start: 2016-09-20 — End: 2016-09-24
  Administered 2016-09-20 – 2016-09-24 (×5): 8 mg via ORAL
  Filled 2016-09-19 (×5): qty 1

## 2016-09-19 MED ORDER — HYDRALAZINE HCL 50 MG PO TABS
75.0000 mg | ORAL_TABLET | Freq: Three times a day (TID) | ORAL | Status: DC
  Administered 2016-09-19 – 2016-09-21 (×8): 75 mg via ORAL
  Filled 2016-09-19 (×9): qty 1

## 2016-09-19 MED ORDER — DOXAZOSIN MESYLATE 8 MG PO TABS: 4.0000 mg | ORAL_TABLET | Freq: Every day | ORAL | Status: DC

## 2016-09-19 NOTE — Progress Notes (Signed)
Jeffrey Campbell Progress Note   Subjective: Creatinine continues to inch upwards.  Pt has no complaints this AM.  He no longer is requiring a sitter.    Vitals:   09/18/16 0929 09/18/16 1629 09/18/16 1945 09/19/16 0525  BP: 130/60 (!) 131/46 (!) 130/44 (!) 136/39  Pulse: (!) 56 (!) 45 (!) 48 (!) 47  Resp:  _0 Temp:  97.7 F (36.5 C) 98.2 F (36.8 C) 98.3 F (36.8 C)  TempSrc:  Oral Oral Oral  SpO2: 98% 98% 99% 98%  Weight:    60.3 kg (133 lb)  Height:        Inpatient medications: . aspirin EC  81 mg Oral Daily  . atorvastatin  40 mg Oral Daily  . brimonidine  1 drop Both Eyes BID  . brinzolamide  1 drop Both Eyes BID  . [START ON 09/20/2016] doxazosin  8 mg Oral Daily  . feeding supplement (ENSURE ENLIVE)  237 mL Oral BID BM  . heparin  5,000 Units Subcutaneous Q8H  . hydrALAZINE  75 mg Oral TID  . levothyroxine  137 mcg Oral QAC breakfast  . multivitamin with minerals  1 tablet Oral Daily   . cefTRIAXone (ROCEPHIN)  IV Stopped (09/18/16 2038)   acetaminophen, hydrALAZINE, LORazepam  Exam: Gen thin, man, lying in bed, sleeping and easily arousable HEENT Sclera anicteric NECK 2 cm JVD PULM clear bilat CV RRR no MRG ABD soft ntnd no mass or ascites +bs, no scars or hsm MSK no joint effusions or deformity EXT no LE or UE edema / no wounds or ulcers Neuro: alert, oriented to hospital, no sitter necessary at this time, no asterixis    Assess: 1  CKD stage IV baseline eGFR 17-20, 24 hr urine clearance with CrCl of 17 earlier this hospitalization.  He is followed by Dr. Justin Mend in clinic.  He came in with a mild AKI likely due to hypertensive emergency and was exacerbated by the addition of aggressive RAAS blockade 6/17 which has been d/c'd.  Renal ultrasound 6/22 with diffuse echogenicity so any decrease in renal blood flow can cause a significant AKI.  His rate of rise seemed to plateau earlier with fluids; however Cr still inching upwards.  We could  consider a session or two of dialysis to help clear some uremia; overall however he is less agitated than before and is no longer requiring the sitter.  I am also suspicious of bradycardia (which admittedly is longstanding) causing chronic decreased renal perfusion which isn't helping the situation.  Will make NPO tonight in case he needs a line in the AM.  2  Acute encephalopathy: improved from earlier this week; however not quite back to baseline.  3  HTN urgency: BP better and still a little overcontrolled though in setting of wide pulse pressure.  Do not recommend restarting any type of RAAS blockade.  Stop amlodipine, decrease hydralazine to 75 mg TID, and continue cardura 8 mg daily.    4  Possible UTI: urine culture with multiple species; on CTX (6/21).  5.  Bradycardia: Has a known junctional bradycardia, no BB, agree with d/cing aricept.  Possibly contributory to some recrudescence of AKI in #1  6.  Will need a SNF  Madelon Lips MD [place] pager [phone] 09/19/2016, 10:50 AM    Recent Labs Lab 09/17/16 0415 09/18/16 0905 09/19/16 0407  NA 133* 134* 136  K 4.6 4.9 4.9  CL 105 106 108  CO2 22 20*  22  GLUCOSE 122* 153* 117*  BUN 116* 124* 131*  CREATININE 4.78* 4.91* 5.16*  CALCIUM 9.2 9.1 9.0   No results for input(s): AST, ALT, ALKPHOS, BILITOT, PROT, ALBUMIN in the last 168 hours.  Recent Labs Lab 09/18/16 0905 09/19/16 0407  WBC 10.0 8.5  HGB 8.9* 8.8*  HCT 26.9* 26.7*  MCV 92.1 93.0  PLT 252 260   Iron/TIBC/Ferritin/ %Sat No results found for: IRON, TIBC, FERRITIN, IRONPCTSAT   

## 2016-09-19 NOTE — Progress Notes (Signed)
Pt stable during 7am to 7pm shift, no any complications and complain of chest pain noted, pt was out of bed most of the time sitting in the recliner, Condom catheter is in place, will be in NPO from midnight for possible line for possible HD, family members bed side and updating, will continue to monitor

## 2016-09-19 NOTE — Progress Notes (Signed)
PROGRESS NOTE    ARLYNN MCDERMID  GUY:[id number] DOB: 02-Aug-1932 DOA: 09/04/2016 PCP: [name], MD   Brief Narrative:  83 y/o BM PMHx Carotid artery stenosis,Diastolic dysfunction; Dilated aortic root, Systolic Heart murmur,Diastolic dysfunction, HTN,Pulmonary hypertension , HTN, OSA, HLD,CKD stage III, DM type II  Who presents with confusion worsening over last week and severe hypertension. He has a normal MS at baseline, and was well at a recent appt in May. He does have refractory HTN (with RAS and other complicating issues) and does manage his own medications, so it is not known if he has been taking them at all. He does also have significant resting bradycardia, with a junctional rhythm with a rate of 42 at a recent clinic visit. In the ED, he was started on a nicardipine gtt to manage his BP and PCCM was asked to admit.   Subjective: 6/24 A/O 1 (does not know where, when, why), pleasantly confused sitting in chair follows all commands.    Assessment & Plan:   Active Problems:   Type 2 diabetes, diet controlled (HCC)   CKD (chronic kidney disease), stage III   Bradycardia   Diastolic dysfunction   Pulmonary hypertension (HCC)   Hypertensive emergency   Acute renal failure superimposed on stage 3 chronic kidney disease (HCC)   Hypertensive encephalopathy   Hypertensive Emergency -Over controlled, causing hypoperfusion of kidney -Cardura 8 mg daily -Hydralazine 75 mg TID -SBP goal 150-170. Per nephrology recommendation -DO NOT INCREASE/AND BP medication UNLESS DIRECTED BY NEPHROLOGY  Chronic diastolic CHF /Pulmonary venous HTN -Strict I&O since admission +53 ml -Daily weight Filed Weights   09/17/16 0623 09/18/16 0300 09/19/16 0525  Weight: 133 lb 1.6 oz (60.4 kg) 138 lb 6.4 oz (62.8 kg) 133 lb (60.3 kg)    Bradycardia -Continues to have some episodes of bradycardia but asymptomatic. Continue to hold  Nodal blocking agent  OSA COPD overlap syndrome -CPAP QHS   -Will hold inhalers here since he does not take at home anyway  Acute encephalopathy: QT is 0.37 -Avoid all sedating medication: DO NOT GIVE PATIENT BENZODIAZEPINE  Uremic Encephalopathy Paranoia/Psychosis/Hospital Delirium -MRI negative for acute CVA  -See CKD stage IV  Dementia? -After speaking with family on 6/17 appears patient may have mild dementia prior to admission. -Consider starting medication, will discuss with wife and daughter. May slow progression.  Acute renal failure on CKD Stage 4 (baseline Cr 2.7)  Follow urine output and Cr Lab Results  Component Value Date   CREATININE 5.16 (H) 09/19/2016   CREATININE 4.91 (H) 09/18/2016   CREATININE 4.78 (H) 09/17/2016  -DC Lasix -Avoid all nephrotoxic medication -Spoke with nephrology Dr. Madelon Lips, may start HD on 6/25 a couple of sessions to improve patient's uremia.   UTI? -Very questionable UTI complete 5 days antibiotics  Hypothyroidism -TSH is normal: Slightly elevated free T4 -Continue synthyroid  Hypokalemia -Potassium goal> 4  Anorexia  -Patient now eating/drinking has improved     DVT prophylaxis: Heparin subcutaneous Code Status: Full Family Communication: None  Disposition Plan: SNF vs Home if patient's cognition improves   Consultants:  PCCM Nephrology Dr. Madelon Lips     Procedures/Significant Events:  6/9 CT head W Wo contrast: Negative acute/chronic infarct 6/11 Echocardiogram Left ventricle: LVEF = 60% to 65%. - (grade 1 diastolic dysfunction). - Left atrium/Right atrium:  severely dilated. -Pulmonary arteries:  PA  peak pressure: 46 mm Hg (S). 6/16 MRI Brain: -Negative acute CVA -beginning confluent hyperintense T2-weighted signal within the periventricular white  matter, most often seen in the setting of chronic microvascular ischemia.   VENTILATOR SETTINGS: None   Cultures 6/10 MRSA by PCR negative 6/22 urine positive multiple  species    Antimicrobials:Anti-infectives    Start     Stop   09/16/16 2000  cefTRIAXone (ROCEPHIN) 1 g in dextrose 5 % 50 mL IVPB     09/20/16 2359   09/16/16 1915  cefTRIAXone (ROCEPHIN) injection 1 g  Status:  Discontinued     09/16/16 1906      Devices None   LINES / TUBES:  None    Continuous Infusions: . cefTRIAXone (ROCEPHIN)  IV Stopped (09/18/16 2038)     Objective: Vitals:   09/18/16 0929 09/18/16 1629 09/18/16 1945 09/19/16 0525  BP: 130/60 (!) 131/46 (!) 130/44 (!) 136/39  Pulse: (!) 56 (!) 45 (!) 48 (!) 47  Resp:  20 18 18   Temp:  97.7 F (36.5 C) 98.2 F (36.8 C) 98.3 F (36.8 C)  TempSrc:  Oral Oral Oral  SpO2: 98% 98% 99% 98%  Weight:    133 lb (60.3 kg)  Height:        Intake/Output Summary (Last 24 hours) at 09/19/16 0719 Last data filed at 09/19/16 9326  Gross per 24 hour  Intake              530 ml  Output              775 ml  Net             -245 ml   Filed Weights   09/17/16 0623 09/18/16 0300 09/19/16 0525  Weight: 133 lb 1.6 oz (60.4 kg) 138 lb 6.4 oz (62.8 kg) 133 lb (60.3 kg)    Examination:  General: A/O 1 (does not know where, when, why), pleasantly confused No acute respiratory distress, cachectic Eyes: negative scleral hemorrhage, negative anisocoria, negative icterus ENT: Negative Runny nose, negative gingival bleeding, Neck:  Negative scars, masses, torticollis, lymphadenopathy, JVD Lungs: Clear to auscultation bilaterally without wheezes or crackles Cardiovascular: Regular rate and rhythm without murmur gallop or rub normal S1 and S2 Abdomen: negative abdominal pain, nondistended, positive soft, bowel sounds, no rebound, no ascites, no appreciable mass Extremities: No significant cyanosis, clubbing, or edema bilateral lower extremities Skin: Negative rashes, lesions, ulcers Psychiatric:  Unable to evaluate secondary to patient being asleep secondary to medication   Central nervous system:  Unable to evaluate secondary  to patient being asleep secondary to medication.      Data Reviewed: Care during the described time interval was provided by me .  I have reviewed this patient's available data, including medical history, events of note, physical examination, and all test results as part of my evaluation. I have personally reviewed and interpreted all radiology studies.  CBC:  Recent Labs Lab 09/18/16 0905 09/19/16 0407  WBC 10.0 8.5  HGB 8.9* 8.8*  HCT 26.9* 26.7*  MCV 92.1 93.0  PLT 252 712   Basic Metabolic Panel:  Recent Labs Lab 09/13/16 0318  09/15/16 0737 09/16/16 0343 09/17/16 0415 09/18/16 0905 09/19/16 0407  NA 137  < > 133* 132* 133* 134* 136  K 4.6  < > 4.5 4.4 4.6 4.9 4.9  CL 105  < > 104 103 105 106 108  CO2 24  < > 22 21* 22 20* 22  GLUCOSE 145*  < > 131* 159* 122* 153* 117*  BUN 60*  < > 95* 110* 116* 124* 131*  CREATININE 2.89*  < >  3.96* 4.53* 4.78* 4.91* 5.16*  CALCIUM 9.8  < > 9.2 8.9 9.2 9.1 9.0  MG 2.4  --   --   --   --   --   --   < > = values in this interval not displayed. GFR: Estimated Creatinine Clearance: 9.6 mL/min (A) (by C-G formula based on SCr of 5.16 mg/dL (H)). Liver Function Tests: No results for input(s): AST, ALT, ALKPHOS, BILITOT, PROT, ALBUMIN in the last 168 hours. No results for input(s): LIPASE, AMYLASE in the last 168 hours. No results for input(s): AMMONIA in the last 168 hours. Coagulation Profile: No results for input(s): INR, PROTIME in the last 168 hours. Cardiac Enzymes: No results for input(s): CKTOTAL, CKMB, CKMBINDEX, TROPONINI in the last 168 hours. BNP (last 3 results)  Recent Labs  04/20/16 1135 04/28/16 1046  PROBNP 1,703* 1,296*   HbA1C: No results for input(s): HGBA1C in the last 72 hours. CBG:  Recent Labs Lab 09/12/16 1634  GLUCAP 157*   Lipid Profile: No results for input(s): CHOL, HDL, LDLCALC, TRIG, CHOLHDL, LDLDIRECT in the last 72 hours. Thyroid Function Tests: No results for input(s): TSH,  T4TOTAL, FREET4, T3FREE, THYROIDAB in the last 72 hours. Anemia Panel: No results for input(s): VITAMINB12, FOLATE, FERRITIN, TIBC, IRON, RETICCTPCT in the last 72 hours. Urine analysis:    Component Value Date/Time   COLORURINE AMBER (A) 09/16/2016 1820   APPEARANCEUR TURBID (A) 09/16/2016 1820   LABSPEC 1.014 09/16/2016 1820   PHURINE 5.0 09/16/2016 1820   GLUCOSEU NEGATIVE 09/16/2016 1820   HGBUR NEGATIVE 09/16/2016 1820   BILIRUBINUR NEGATIVE 09/16/2016 1820   KETONESUR NEGATIVE 09/16/2016 1820   PROTEINUR 100 (A) 09/16/2016 1820   NITRITE NEGATIVE 09/16/2016 1820   LEUKOCYTESUR LARGE (A) 09/16/2016 1820   Sepsis Labs: @LABRCNTIP (procalcitonin:4,lacticidven:4)  ) Recent Results (from the past 240 hour(s))  Culture, Urine     Status: Abnormal   Collection Time: 09/16/16  6:21 PM  Result Value Ref Range Status   Specimen Description URINE, RANDOM  Final   Special Requests NONE  Final   Culture MULTIPLE SPECIES PRESENT, SUGGEST RECOLLECTION (A)  Final   Report Status 09/17/2016 FINAL  Final         Radiology Studies: US Renal  Result Date: 09/17/2016 EXAM: RENAL / URINARY TRACT ULTRASOUND COMPLETE COMPARISON:  Ultrasound 05/05/2016 FINDINGS: Right Kidney: Length: 3.6 cm. Diffuse increased echogenicity consistent with medical renal disease. Relatively normal cortical thickness and no hydronephrosis. Left Kidney: Length: 9.4 cm. Increased echogenicity suggesting medical renal disease. Normal renal cortical thickness and no hydronephrosis. Small renal cysts are stable. Bladder: Mild diffuse bladder wall thickening and some bladder debris. No mass. Other: Bilateral pleural effusions are noted. IMPRESSION: Increased echogenicity of both kidneys consistent with medical renal disease. No findings for hydronephrosis. Mild diffuse bladder wall thickening. Electronically Signed   By: Marijo Sanes M.D.   On: 09/17/2016 16:41        Scheduled Meds: . amLODipine  5 mg Oral Daily   . aspirin EC  81 mg Oral Daily  . atorvastatin  40 mg Oral Daily  . brimonidine  1 drop Both Eyes BID  . brinzolamide  1 drop Both Eyes BID  . doxazosin  8 mg Oral Daily  . feeding supplement (ENSURE ENLIVE)  237 mL Oral BID BM  . heparin  5,000 Units Subcutaneous Q8H  . hydrALAZINE  100 mg Oral TID  . levothyroxine  137 mcg Oral QAC breakfast  . multivitamin with minerals  1  tablet Oral Daily   Continuous Infusions: . cefTRIAXone (ROCEPHIN)  IV Stopped (09/18/16 2038)     LOS: 14 days    Time spent: 40 minutes    Kealii Thueson, Geraldo Docker, MD [facility] Pager [phone]   If 7PM-7AM, please contact night-coverage [email or web address] Password [facility] 09/19/2016, 7:19 AM

## 2016-09-19 NOTE — Progress Notes (Signed)
Pt refuses CPAP 

## 2016-09-20 ENCOUNTER — Inpatient Hospital Stay (HOSPITAL_COMMUNITY): Payer: Medicare Other

## 2016-09-20 DIAGNOSIS — E875 Hyperkalemia: Secondary | ICD-10-CM

## 2016-09-20 LAB — BASIC METABOLIC PANEL
ANION GAP: 9 (ref 5–15)
BUN: 129 mg/dL — AB (ref 6–20)
CO2: 19 mmol/L — AB (ref 22–32)
Calcium: 9.5 mg/dL (ref 8.9–10.3)
Chloride: 107 mmol/L (ref 101–111)
Creatinine, Ser: 4.81 mg/dL — ABNORMAL HIGH (ref 0.61–1.24)
GFR calc Af Amer: 12 mL/min — ABNORMAL LOW (ref >60)
GFR, EST NON AFRICAN AMERICAN: 10 mL/min — AB (ref >60)
GLUCOSE: 129 mg/dL — AB (ref 65–99)
POTASSIUM: 5.3 mmol/L — AB (ref 3.5–5.1)
Sodium: 135 mmol/L (ref 135–145)

## 2016-09-20 LAB — HEMOGLOBIN A1C
HEMOGLOBIN A1C: 5.5 % (ref 4.8–5.6)
Mean Plasma Glucose: 111 mg/dL

## 2016-09-20 MED ORDER — SODIUM CHLORIDE 0.9 % IV SOLN
INTRAVENOUS | Status: DC
  Administered 2016-09-20: 15:00:00 via INTRAVENOUS

## 2016-09-20 NOTE — Progress Notes (Signed)
Assess: 1 CKD stage IV baseline eGFR 17-20, 24 hr urine clearance with CrCl of 17 earlier this hospitalization.  He is followed by Dr. Justin Mend in clinic.  He came in with worsening  AKI due to hypertensive emergency and was exacerbated by the addition of aggressive RAAS blockade 6/17.       Will follow renal fct, hopeful for plateau, check CXR---may need some volume, no dialysis at this time  2  Acute encephalopathy: improved   3 HTN encephalopathy: BP sub-optimal but  improved   4  Possible UTI: urine culture with multiple species; on CTX (6/21).  5.  Bradycardia: Has a known junctional bradycardia, no BB  .  Will need a SNF  Subjective: Interval History: none.  Objective: Vital signs in last 24 hours: Temp:  [98 F (36.7 C)-98.4 F (36.9 C)] 98 F (36.7 C) (06/25 0400) Pulse Rate:  [40-47] 42 (06/25 0400) Resp:  [14-18] 18 (06/25 0400) BP: (138-158)/(47-118) 138/101 (06/25 1008) SpO2:  [97 %-98 %] 98 % (06/25 0400) Weight:  [62.5 kg (137 lb 12.8 oz)] 62.5 kg (137 lb 12.8 oz) (06/25 0400) Weight change: 2.177 kg (4 lb 12.8 oz)  Intake/Output from previous day: 06/24 0701 - 06/25 0700 In: 360 [P.O.:360] Out: 901 [Urine:900; Stool:1] Intake/Output this shift: No intake/output data recorded.  General appearance: alert and cooperative Resp: clear to auscultation bilaterally Chest wall: no tenderness Cardio: regular rate and rhythm, S1, S2 normal, no murmur, click, rub or gallop Extremities: extremities normal, atraumatic, no cyanosis or edema Neuro appropriate Lab Results:  Recent Labs  09/18/16 0905 09/19/16 0407  WBC 10.0 8.5  HGB 8.9* 8.8*  HCT 26.9* 26.7*  PLT 252 260   BMET:  Recent Labs  09/18/16 0905 09/19/16 0407  NA 134* 136  K 4.9 4.9  CL 106 108  CO2 20* 22  GLUCOSE 153* 117*  BUN 124* 131*  CREATININE 4.91* 5.16*  CALCIUM 9.1 9.0   No results for input(s): PTH in the last 72 hours. Iron Studies: No results for input(s): IRON, TIBC,  TRANSFERRIN, FERRITIN in the last 72 hours. Studies/Results: No results found.  Scheduled: . aspirin EC  81 mg Oral Daily  . atorvastatin  40 mg Oral Daily  . brimonidine  1 drop Both Eyes BID  . brinzolamide  1 drop Both Eyes BID  . doxazosin  8 mg Oral Daily  . feeding supplement (ENSURE ENLIVE)  237 mL Oral BID BM  . heparin  5,000 Units Subcutaneous Q8H  . hydrALAZINE  75 mg Oral TID  . levothyroxine  137 mcg Oral QAC breakfast  . multivitamin with minerals  1 tablet Oral Daily     LOS: 15 days   Jeffrey Campbell C 09/20/2016,10:26 AM

## 2016-09-20 NOTE — Progress Notes (Signed)
Physical Therapy Treatment Patient Details Name: Jeffrey Campbell MRN: [id number] DOB: 05-May-1932 Today's Date: 09/20/2016    History of Present Illness Pt adm with hypertensive emergency and acute encephalopathy. PMH - DM, CAD, CKD, etoh abuse, diastolic dysfunction    PT Comments    Patient demo'd less confusion this session however continues to be confused at times. Min guard/min A for safe mobility. Current plan remains appropriate.   Follow Up Recommendations  SNF     Equipment Recommendations  Other (comment) (To be assessed)    Recommendations for Other Services       Precautions / Restrictions Precautions Precautions: Fall Restrictions Weight Bearing Restrictions: No    Mobility  Bed Mobility               General bed mobility comments: Pt OOB in chair upon arrival  Transfers Overall transfer level: Needs assistance Equipment used: Rolling walker (2 wheeled) Transfers: Sit to/from Stand Sit to Stand: Min guard         General transfer comment: min guard for safety  Ambulation/Gait Ambulation/Gait assistance: Min assist Ambulation Distance (Feet): 250 Feet Assistive device: Rolling walker (2 wheeled) Gait Pattern/deviations: Step-through pattern;Drifts right/left;Decreased step length - right;Decreased step length - left;Narrow base of support;Trunk flexed Gait velocity: decr   General Gait Details: cues for proximity of RW and wider BOS; pt unsteady with turning and head turns   Financial trader Rankin (Stroke Patients Only)       Balance Overall balance assessment: Needs assistance Sitting-balance support: No upper extremity supported;Feet supported Sitting balance-Leahy Scale: Fair     Standing balance support: Single extremity supported Standing balance-Leahy Scale: Poor Standing balance comment: single UE required for balance                            Cognition  Arousal/Alertness: Awake/alert Behavior During Therapy: WFL for tasks assessed/performed Overall Cognitive Status: Impaired/Different from baseline Area of Impairment: Orientation;Attention;Memory;Following commands;Safety/judgement;Problem solving                 Orientation Level: Disoriented to;Place;Situation Current Attention Level: Sustained Memory: Decreased recall of precautions;Decreased short-term memory Following Commands: Follows one step commands consistently Safety/Judgement: Decreased awareness of safety;Decreased awareness of deficits   Problem Solving: Decreased initiation;Requires verbal cues;Requires tactile cues;Difficulty sequencing General Comments: pt continues to demo confusion on and off and did not realize father's day had past and reported his fathers day card was some else's       Exercises      General Comments        Pertinent Vitals/Pain Pain Assessment: No/denies pain    Home Living                      Prior Function            PT Goals (current goals can now be found in the care plan section) Progress towards PT goals: Progressing toward goals    Frequency    Min 2X/week      PT Plan Current plan remains appropriate;Frequency needs to be updated    Co-evaluation              AM-PAC PT "6 Clicks" Daily Activity  Outcome Measure  Difficulty turning over in bed (including adjusting bedclothes, sheets and blankets)?: A Little Difficulty moving from lying on back to sitting  on the side of the bed? : Total Difficulty sitting down on and standing up from a chair with arms (e.g., wheelchair, bedside commode, etc,.)?: Total Help needed moving to and from a bed to chair (including a wheelchair)?: A Little Help needed walking in hospital room?: A Little Help needed climbing 3-5 steps with a railing? : A Little 6 Click Score: 14    End of Session Equipment Utilized During Treatment: Gait belt Activity Tolerance:  Patient tolerated treatment well Patient left: in chair;with call bell/phone within reach;with chair alarm set Nurse Communication: Mobility status PT Visit Diagnosis: Unsteadiness on feet (R26.81);Muscle weakness (generalized) (M62.81)     Time: 7897-8478 PT Time Calculation (min) (ACUTE ONLY): 19 min  Charges:  $Gait Training: 8-22 mins                    G Codes:       Earney Navy, PTA Pager: [phone]     Darliss Cheney 09/20/2016, 12:54 PM

## 2016-09-20 NOTE — Progress Notes (Signed)
Initial Nutrition Assessment  DOCUMENTATION CODES:   Severe malnutrition in context of chronic illness  INTERVENTION:   -Acupuncturist Cup at [employer] and [employer] po BID, each supplement provides 350 kcal and 20 grams of protein  NUTRITION DIAGNOSIS:   Malnutrition (Severe) related to chronic illness (CAD, CKD, ETOH use) as evidenced by severe depletion of muscle mass, severe depletion of body fat.  Being addressed via supplements  GOAL:   Patient will meet greater than or equal to 90% of their needs  Progressing  MONITOR:   PO intake, Supplement acceptance, Labs  REASON FOR ASSESSMENT:   Malnutrition Screening Tool    ASSESSMENT:   Pt with PMH of CAD, CKD stage III, ETOH abuse, HTN, OSA, PUD, pulmonary HTN, DM admitted with worsening confusion and hypertensive emergency and hypertensive encephalopathy.   Pt remains confused at times Renal function worsening, uremic, no dialysis at this time Recorded po intake >50% of meals on average  Labs: Creatinine 4.81, BUN 129, potassium 5.3  Diet Order:  Diet renal with fluid restriction Fluid restriction: 1200 mL Fluid; Room service appropriate? Yes; Fluid consistency: Thin  Skin:  Reviewed, no issues  Last BM:  6/23  Height:   Ht Readings from Last 1 Encounters:  09/04/16 5\' 10"  (1.778 m)    Weight:   Wt Readings from Last 1 Encounters:  09/20/16 137 lb 12.8 oz (62.5 kg)    Ideal Body Weight:  75.4 kg  BMI:  Body mass index is 19.77 kg/m.  Estimated Nutritional Needs:   Kcal:  1700-1900  Protein:  85-100 grams  Fluid:  > 1.7 L/day  EDUCATION NEEDS:   No education needs identified at this time  [place], [place], LDN [phone] Pager  [phone] Weekend/On-Call Pager

## 2016-09-20 NOTE — Progress Notes (Addendum)
PROGRESS NOTE                                                                                                                                                                                                             Patient Demographics:    Jeffrey Campbell, is a 83 y.o. male, DOB - 14-Oct-1932, NUU:[id number]  Admit date - 09/04/2016   Admitting Physician Luz Brazen, MD  Outpatient Primary MD for the patient is [name], MD  [facility] [place]  Chief Complaint  Patient presents with  . Altered Mental Status       Brief Narrative   83 year old male with carotid artery stenosis, chronic kidney disease stage III, OSA, type 2 diabetes mellitus, diastolic dysfunction, pulmonary hypertension, OSA who was admitted with hypertensive encephalopathy. Patient was also found to be bradycardic in the 40s. He was admitted to the ICU on the cart. 2-D echo showed normal EF with grade 1 diastolic dysfunction and severely dilated LA. MRI of the brain was negative for stroke antral chronic microvascular changes. Patient transferred to telemetry. Hospital course prolonged due to worsening renal function and mental status changes.   Subjective:    Patient appears more oriented today.   Assessment  & Plan :   Principal problem Hypertensive emergency with encephalopathy Continue Cardura and hydralazine. Nephrology recommends permissive hypertension and worsened kidney injury may be contributed by overcorrection of hypertension causing renal hypoperfusion.  Acute on chronic kidney disease stage IV Worsened renal function due to hypertensive emergency and aggravated with use of ACE inhibitor on 6/17. Renal function slightly better in a.m. labs. Hopefully renal function has plateaued and started to improve. I will add gentle hydration or 24 hours.  Acute metabolic encephalopathy Initiated due to hypertensive  encephalopathy. Possibly due to UTI with cultures growing mixed species. Treated with empiric Rocephin. Also possibly has mild underlying dementia (as per family)  Junctional bradycardia Beta blocker discontinued. Heart rate currently stable.  Hyperkalemia Monitor on telemetry. Check labs in a.m.  OSA Continue nighttime CPAP.  Hypothyroidism Continue Synthroid   Anemia of chronic kidney disease     Code Status : Full code  Family Communication  : None at bedside  Disposition Plan  : SNF once function improves  Barriers For Discharge : Acute kidney injury  Consults  :  Nephrology  Procedures  :  Head CT MRI brain Renal ultrasound   DVT Prophylaxis  :  Heparin  Lab Results  Component Value Date   PLT 260 09/19/2016    Antibiotics  :    Anti-infectives    Start     Dose/Rate Route Frequency Ordered Stop   09/16/16 2000  cefTRIAXone (ROCEPHIN) 1 g in dextrose 5 % 50 mL IVPB     1 g 100 mL/hr over 30 Minutes Intravenous Every 24 hours 09/16/16 1907 09/20/16 2359   09/16/16 1915  cefTRIAXone (ROCEPHIN) injection 1 g  Status:  Discontinued     1 g Intramuscular Every 24 hours 09/16/16 1902 09/16/16 1906        Objective:   Vitals:   09/19/16 1606 09/19/16 2028 09/20/16 0400 09/20/16 1008  BP: 140/90 (!) 158/52 (!) 155/47 (!) 138/101  Pulse: (!) 45 (!) 47 (!) 42   Resp:  18 18   Temp:  98.4 F (36.9 C) 98 F (36.7 C)   TempSrc:  Oral Oral   SpO2:  97% 98%   Weight:   62.5 kg (137 lb 12.8 oz)   Height:        Wt Readings from Last 3 Encounters:  09/20/16 62.5 kg (137 lb 12.8 oz)  07/28/16 62.6 kg (138 lb)  07/21/16 63 kg (138 lb 12.8 oz)     Intake/Output Summary (Last 24 hours) at 09/20/16 1412 Last data filed at 09/20/16 0500  Gross per 24 hour  Intake              120 ml  Output              500 ml  Net             -380 ml     Physical Exam  Gen: not in distress HEENT:  moist mucosa, supple neck Chest: clear b/l, no added  sounds CVS: N S1&S2, no murmurs,  GI: soft, NT, ND,  Musculoskeletal: warm, no edema CNS: AAOX1-2, nonfocal    Data Review:    CBC  Recent Labs Lab 09/18/16 0905 09/19/16 0407  WBC 10.0 8.5  HGB 8.9* 8.8*  HCT 26.9* 26.7*  PLT 252 260  MCV 92.1 93.0  MCH 30.5 30.7  MCHC 33.1 33.0  RDW 15.2 15.1    Chemistries   Recent Labs Lab 09/16/16 0343 09/17/16 0415 09/18/16 0905 09/19/16 0407 09/20/16 0941  NA 132* 133* 134* 136 135  K 4.4 4.6 4.9 4.9 5.3*  CL 103 105 106 108 107  CO2 21* 22 20* 22 19*  GLUCOSE 159* 122* 153* 117* 129*  BUN 110* 116* 124* 131* 129*  CREATININE 4.53* 4.78* 4.91* 5.16* 4.81*  CALCIUM 8.9 9.2 9.1 9.0 9.5   ------------------------------------------------------------------------------------------------------------------ No results for input(s): CHOL, HDL, LDLCALC, TRIG, CHOLHDL, LDLDIRECT in the last 72 hours.  Lab Results  Component Value Date   HGBA1C 5.5 09/19/2016   ------------------------------------------------------------------------------------------------------------------ No results for input(s): TSH, T4TOTAL, T3FREE, THYROIDAB in the last 72 hours.  Invalid input(s): FREET3 ------------------------------------------------------------------------------------------------------------------ No results for input(s): VITAMINB12, FOLATE, FERRITIN, TIBC, IRON, RETICCTPCT in the last 72 hours.  Coagulation profile No results for input(s): INR, PROTIME in the last 168 hours.  No results for input(s): DDIMER in the last 72 hours.  Cardiac Enzymes No results for input(s): CKMB, TROPONINI, MYOGLOBIN in the last 168 hours.  Invalid input(s): CK ------------------------------------------------------------------------------------------------------------------    Component Value Date/Time   BNP 801.8 (H) 03/03/2015 1215  BNP 620.8 (H) 01/23/2015 0944    Inpatient Medications  Scheduled Meds: . aspirin EC  81 mg Oral Daily  .  atorvastatin  40 mg Oral Daily  . brimonidine  1 drop Both Eyes BID  . brinzolamide  1 drop Both Eyes BID  . doxazosin  8 mg Oral Daily  . feeding supplement (ENSURE ENLIVE)  237 mL Oral BID BM  . heparin  5,000 Units Subcutaneous Q8H  . hydrALAZINE  75 mg Oral TID  . levothyroxine  137 mcg Oral QAC breakfast  . multivitamin with minerals  1 tablet Oral Daily   Continuous Infusions: . cefTRIAXone (ROCEPHIN)  IV Stopped (09/19/16 2139)   PRN Meds:.acetaminophen, hydrALAZINE, LORazepam  Micro Results Recent Results (from the past 240 hour(s))  Culture, Urine     Status: Abnormal   Collection Time: 09/16/16  6:21 PM  Result Value Ref Range Status   Specimen Description URINE, RANDOM  Final   Special Requests NONE  Final   Culture MULTIPLE SPECIES PRESENT, SUGGEST RECOLLECTION (A)  Final   Report Status 09/17/2016 FINAL  Final  Culture, Urine     Status: Abnormal (Preliminary result)   Collection Time: 09/19/16 12:21 PM  Result Value Ref Range Status   Specimen Description URINE, CLEAN CATCH  Final   Special Requests NONE  Final   Culture 20,000 COLONIES/mL GRAM NEGATIVE RODS (A)  Final   Report Status PENDING  Incomplete    Radiology Reports Dg Chest 2 View  Result Date: 09/04/2016 CLINICAL DATA:  Unsteady while standing. Cough and altered mental status. EXAM: CHEST  2 VIEW COMPARISON:  01/28/2015 CXR, chest CT 05/12/2016 FINDINGS: Stable cardiomegaly with aortic atherosclerosis. Mild central vascular congestion. No pneumonic consolidation, effusion or pneumothorax. No dominant mass is seen. No acute nor suspicious osseous abnormality. IMPRESSION: Cardiomegaly with aortic atherosclerosis. Mild vascular congestion since prior exam is noted. Electronically Signed   By: Ashley Royalty M.D.   On: 09/04/2016 22:42   Ct Head Wo Contrast  Result Date: 09/04/2016 CLINICAL DATA:  Altered mental status EXAM: CT HEAD WITHOUT CONTRAST TECHNIQUE: Contiguous axial images were obtained from the base  of the skull through the vertex without intravenous contrast. COMPARISON:  None. FINDINGS: BRAIN: There is sulcal and ventricular prominence consistent with superficial and central atrophy. No intraparenchymal hemorrhage, mass effect nor midline shift. Periventricular and subcortical white matter hypodensities consistent with chronic small vessel ischemic disease are identified. No acute large vascular territory infarcts. No abnormal extra-axial fluid collections. Basal cisterns are not effaced and midline. VASCULAR: Moderate calcific atherosclerosis of the carotid siphons. SKULL: No skull fracture. No significant scalp soft tissue swelling. SINUSES/ORBITS: The mastoid air-cells are clear. The included paranasal sinuses are well-aerated.The included ocular globes and orbital contents are non-suspicious. OTHER: None. IMPRESSION: 1. Chronic moderate degree small vessel ischemia with cerebral atrophy. 2. No acute intracranial abnormality. Electronically Signed   By: Ashley Royalty M.D.   On: 09/04/2016 22:12   Mr Brain Wo Contrast  Result Date: 09/12/2016 CLINICAL DATA:  Altered mental status EXAM: MRI HEAD WITHOUT CONTRAST TECHNIQUE: Multiplanar, multiecho pulse sequences of the brain and surrounding structures were obtained without intravenous contrast. COMPARISON:  CT brain 09/04/2016 FINDINGS: Brain: The midline structures are normal. There is no focal diffusion restriction to indicate acute infarct. There is beginning confluent hyperintense T2-weighted signal within the periventricular white matter, most often seen in the setting of chronic microvascular ischemia. No intraparenchymal hematoma or chronic microhemorrhage. Brain volume is normal for age without age-advanced or  lobar predominant atrophy. The dura is normal and there is no extra-axial collection. Vascular: Major intracranial arterial and venous sinus flow voids are preserved. Skull and upper cervical spine: The visualized skull base, calvarium, upper  cervical spine and extracranial soft tissues are normal. Sinuses/Orbits: No fluid levels or advanced mucosal thickening. No mastoid effusion. 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 IMPRESSION: 1. No acute intracranial abnormality. 2. Advanced white matter disease, most commonly chronic microvascular ischemia. 3. Bilateral exophthalmos, in keeping with thyroid ophthalmopathy. Electronically Signed   By: Ulyses Jarred M.D.   On: 09/12/2016 02:47   US Renal  Result Date: 09/17/2016 EXAM: RENAL / URINARY TRACT ULTRASOUND COMPLETE COMPARISON:  Ultrasound 05/05/2016 FINDINGS: Right Kidney: Length: 3.6 cm. Diffuse increased echogenicity consistent with medical renal disease. Relatively normal cortical thickness and no hydronephrosis. Left Kidney: Length: 9.4 cm. Increased echogenicity suggesting medical renal disease. Normal renal cortical thickness and no hydronephrosis. Small renal cysts are stable. Bladder: Mild diffuse bladder wall thickening and some bladder debris. No mass. Other: Bilateral pleural effusions are noted. IMPRESSION: Increased echogenicity of both kidneys consistent with medical renal disease. No findings for hydronephrosis. Mild diffuse bladder wall thickening. Electronically Signed   By: Marijo Sanes M.D.   On: 09/17/2016 16:41   Dg Chest Port 1 View  Result Date: 09/20/2016 CLINICAL DATA:  83 year old with chronic kidney disease. Possible volume overload. EXAM: PORTABLE CHEST 1 VIEW COMPARISON:  09/06/2016, 09/04/2016 and earlier, including CT chest 05/12/2016 and earlier. FINDINGS: Cardiac silhouette moderately to markedly enlarged, unchanged. Thoracic aorta atherosclerotic, unchanged. Hilar and mediastinal contours otherwise unremarkable. Pulmonary vascularity normal without evidence of pulmonary edema. Atelectasis involving the left lung base. Lungs otherwise clear. IMPRESSION: Atelectasis involving the left lung base. No acute cardiopulmonary disease  otherwise. Stable cardiomegaly without pulmonary edema. Electronically Signed   By: Evangeline Dakin M.D.   On: 09/20/2016 11:27   Dg Chest Port 1 View  Result Date: 09/06/2016 CLINICAL DATA:  Respiratory failure EXAM: PORTABLE CHEST 1 VIEW COMPARISON:  09/04/2016 FINDINGS: Improvement in vascular congestion. Cardiac enlargement. Negative for edema or effusion. Mild left lower lobe airspace disease has developed since the prior study most likely atelectasis. IMPRESSION: Improving vascular congestion. Mild left lower lobe atelectasis. Electronically Signed   By: Franchot Gallo M.D.   On: 09/06/2016 07:00    Time Spent in minutes  25   Louellen Molder M.D on 09/20/2016 at 2:12 PM  Between 7am to 7pm - Pager - [phone]  After 7pm go to [email or web address] - password [facility]  [facility] -  Office  [phone]

## 2016-09-20 NOTE — Clinical Social Work Note (Signed)
CSW continues to follow for discharge needs. Plan is still for [facility].  [place]ton Scrape, [place]

## 2016-09-21 LAB — BASIC METABOLIC PANEL
Anion gap: 6 (ref 5–15)
BUN: 119 mg/dL — AB (ref 6–20)
CALCIUM: 9.3 mg/dL (ref 8.9–10.3)
CO2: 22 mmol/L (ref 22–32)
Chloride: 109 mmol/L (ref 101–111)
Creatinine, Ser: 4.46 mg/dL — ABNORMAL HIGH (ref 0.61–1.24)
GFR calc Af Amer: 13 mL/min — ABNORMAL LOW (ref >60)
GFR, EST NON AFRICAN AMERICAN: 11 mL/min — AB (ref >60)
Glucose, Bld: 108 mg/dL — ABNORMAL HIGH (ref 65–99)
POTASSIUM: 5.2 mmol/L — AB (ref 3.5–5.1)
Sodium: 137 mmol/L (ref 135–145)

## 2016-09-21 LAB — URINE CULTURE

## 2016-09-21 MED ORDER — SODIUM CHLORIDE 0.9 % IV SOLN: INTRAVENOUS | Status: AC

## 2016-09-21 MED ORDER — QUETIAPINE FUMARATE 25 MG PO TABS
25.0000 mg | ORAL_TABLET | Freq: Two times a day (BID) | ORAL | Status: DC
  Administered 2016-09-21 (×2): 25 mg via ORAL
  Filled 2016-09-21 (×3): qty 1

## 2016-09-21 MED ORDER — SODIUM CHLORIDE 0.9 % IV SOLN: INTRAVENOUS | Status: DC

## 2016-09-21 NOTE — Progress Notes (Signed)
PROGRESS NOTE                                                                                                                                                                                                             Patient Demographics:    Jeffrey Campbell, is a 83 y.o. male, DOB - 03-Aug-1932, DTO:[id number]  Admit date - 09/04/2016   Admitting Physician Luz Brazen, MD  Outpatient Primary MD for the patient is [name], MD  LOS - [place]  Chief Complaint  Patient presents with  . Altered Mental Status       Brief Narrative   83 year old male with carotid artery stenosis, chronic kidney disease stage III, OSA, type 2 diabetes mellitus, diastolic dysfunction, pulmonary hypertension, OSA who was admitted with hypertensive encephalopathy. Patient was also found to be bradycardic in the 40s. He was admitted to the ICU on the cart. 2-D echo showed normal EF with grade 1 diastolic dysfunction and severely dilated LA. MRI of the brain was negative for stroke antral chronic microvascular changes. Patient transferred to telemetry. Hospital course prolonged due to worsening renal function and mental status changes.   Subjective:   Patient has been confused this morning pulling  out his telemetry and IV line.   Assessment  & Plan :   Principal problem Hypertensive emergency with encephalopathy Continue Cardura and hydralazine. Nephrology recommends permissive hypertension and worsened kidney injury may be contributed by overcorrection of hypertension causing renal hypoperfusion.  Acute on chronic kidney disease stage IV Worsened renal function due to hypertensive emergency and aggravated with use of ACE inhibitor on 6/17. Her function slowly improving. Hopefully renal function has plateaued and started to improve. Charted on gentle hydration yesterday, patient pulled out his IV line  today.    Acute metabolic encephalopathy Likely due to hypertensive encephalopathy on presentation. Also diagnosed with UTI on 6/22 with mixed species on urine culture. Treated with empiric Rocephin. Patient also has some underlying dementia or family and is possibly sundowning.  Junctional bradycardia Beta blocker discontinued. Heart rate stable in 40s to 50s  Hyperkalemia Stable on telemetry. (Telemetry discontinued patient pulling it out)  OSA Continue nighttime CPAP.  Hypothyroidism Continue Synthroid   Anemia of chronic kidney disease     Code Status : Full code  Family Communication  : None at  bedside  Disposition Plan  : SNF once renal function improves  Barriers For Discharge : Acute kidney injury, encephalopathy  Consults  :   Nephrology  Procedures  :  Head CT MRI brain Renal ultrasound   DVT Prophylaxis  :  Heparin  Lab Results  Component Value Date   PLT 260 09/19/2016    Antibiotics  :    Anti-infectives    Start     Dose/Rate Route Frequency Ordered Stop   09/16/16 2000  cefTRIAXone (ROCEPHIN) 1 g in dextrose 5 % 50 mL IVPB     1 g 100 mL/hr over 30 Minutes Intravenous Every 24 hours 09/16/16 1907 09/20/16 2030   09/16/16 1915  cefTRIAXone (ROCEPHIN) injection 1 g  Status:  Discontinued     1 g Intramuscular Every 24 hours 09/16/16 1902 09/16/16 1906        Objective:   Vitals:   09/20/16 1746 09/20/16 2100 09/20/16 2114 09/21/16 0611  BP: (!) 156/47 (!) 84/60 (!) 169/47 124/61  Pulse: (!) 42 (!) 49 (!) 49 (!) 43  Resp: 18 18  17   Temp: 98.1 F (36.7 C) 97.6 F (36.4 C)  98 F (36.7 C)  TempSrc: Oral Oral  Oral  SpO2: 97% 99%  95%  Weight:    62.4 kg (137 lb 9.6 oz)  Height:        Wt Readings from Last 3 Encounters:  09/21/16 62.4 kg (137 lb 9.6 oz)  07/28/16 62.6 kg (138 lb)  07/21/16 63 kg (138 lb 12.8 oz)     Intake/Output Summary (Last 24 hours) at 09/21/16 1252 Last data filed at 09/21/16 1039  Gross per 24  hour  Intake             1130 ml  Output              275 ml  Net              855 ml     Physical Exam Gen.: Not in distress HEENT: Moist mucosa, supple neck Chest: Clear to auscultation bilaterally CVS: S1 and S2 bradycardic, no murmurs GI: Soft, nondistended, nontender Muscle skeletal: Warm, no edema CNS: AAO 1-2, confused     Data Review:    CBC  Recent Labs Lab 09/18/16 0905 09/19/16 0407  WBC 10.0 8.5  HGB 8.9* 8.8*  HCT 26.9* 26.7*  PLT 252 260  MCV 92.1 93.0  MCH 30.5 30.7  MCHC 33.1 33.0  RDW 15.2 15.1    Chemistries   Recent Labs Lab 09/17/16 0415 09/18/16 0905 09/19/16 0407 09/20/16 0941 09/21/16 0355  NA 133* 134* 136 135 137  K 4.6 4.9 4.9 5.3* 5.2*  CL 105 106 108 107 109  CO2 22 20* 22 19* 22  GLUCOSE 122* 153* 117* 129* 108*  BUN 116* 124* 131* 129* 119*  CREATININE 4.78* 4.91* 5.16* 4.81* 4.46*  CALCIUM 9.2 9.1 9.0 9.5 9.3   ------------------------------------------------------------------------------------------------------------------ No results for input(s): CHOL, HDL, LDLCALC, TRIG, CHOLHDL, LDLDIRECT in the last 72 hours.  Lab Results  Component Value Date   HGBA1C 5.5 09/19/2016   ------------------------------------------------------------------------------------------------------------------ No results for input(s): TSH, T4TOTAL, T3FREE, THYROIDAB in the last 72 hours.  Invalid input(s): FREET3 ------------------------------------------------------------------------------------------------------------------ No results for input(s): VITAMINB12, FOLATE, FERRITIN, TIBC, IRON, RETICCTPCT in the last 72 hours.  Coagulation profile No results for input(s): INR, PROTIME in the last 168 hours.  No results for input(s): DDIMER in the last 72 hours.  Cardiac Enzymes No results  for input(s): CKMB, TROPONINI, MYOGLOBIN in the last 168 hours.  Invalid input(s):  CK ------------------------------------------------------------------------------------------------------------------    Component Value Date/Time   BNP 801.8 (H) 03/03/2015 1215   BNP 620.8 (H) 01/23/2015 0944    Inpatient Medications  Scheduled Meds: . aspirin EC  81 mg Oral Daily  . atorvastatin  40 mg Oral Daily  . brimonidine  1 drop Both Eyes BID  . brinzolamide  1 drop Both Eyes BID  . doxazosin  8 mg Oral Daily  . feeding supplement (ENSURE ENLIVE)  237 mL Oral BID BM  . heparin  5,000 Units Subcutaneous Q8H  . hydrALAZINE  75 mg Oral TID  . levothyroxine  137 mcg Oral QAC breakfast  . multivitamin with minerals  1 tablet Oral Daily   Continuous Infusions: . sodium chloride     PRN Meds:.acetaminophen, hydrALAZINE, LORazepam  Micro Results Recent Results (from the past 240 hour(s))  Culture, Urine     Status: Abnormal   Collection Time: 09/16/16  6:21 PM  Result Value Ref Range Status   Specimen Description URINE, RANDOM  Final   Special Requests NONE  Final   Culture MULTIPLE SPECIES PRESENT, SUGGEST RECOLLECTION (A)  Final   Report Status 09/17/2016 FINAL  Final  Culture, Urine     Status: Abnormal   Collection Time: 09/19/16 12:21 PM  Result Value Ref Range Status   Specimen Description URINE, CLEAN CATCH  Final   Special Requests NONE  Final   Culture 20,000 COLONIES/mL PSEUDOMONAS AERUGINOSA (A)  Final   Report Status 09/21/2016 FINAL  Final   Organism ID, Bacteria PSEUDOMONAS AERUGINOSA (A)  Final      Susceptibility   Pseudomonas aeruginosa - MIC*    CEFTAZIDIME <=1 SENSITIVE Sensitive     CIPROFLOXACIN <=0.25 SENSITIVE Sensitive     GENTAMICIN <=1 SENSITIVE Sensitive     IMIPENEM 2 SENSITIVE Sensitive     PIP/TAZO <=4 SENSITIVE Sensitive     CEFEPIME <=1 SENSITIVE Sensitive     * 20,000 COLONIES/mL PSEUDOMONAS AERUGINOSA    Radiology Reports Dg Chest 2 View  Result Date: 09/04/2016 CLINICAL DATA:  Unsteady while standing. Cough and altered  mental status. EXAM: CHEST  2 VIEW COMPARISON:  01/28/2015 CXR, chest CT 05/12/2016 FINDINGS: Stable cardiomegaly with aortic atherosclerosis. Mild central vascular congestion. No pneumonic consolidation, effusion or pneumothorax. No dominant mass is seen. No acute nor suspicious osseous abnormality. IMPRESSION: Cardiomegaly with aortic atherosclerosis. Mild vascular congestion since prior exam is noted. Electronically Signed   By: Ashley Royalty M.D.   On: 09/04/2016 22:42   Ct Head Wo Contrast  Result Date: 09/04/2016 CLINICAL DATA:  Altered mental status EXAM: CT HEAD WITHOUT CONTRAST TECHNIQUE: Contiguous axial images were obtained from the base of the skull through the vertex without intravenous contrast. COMPARISON:  None. FINDINGS: BRAIN: There is sulcal and ventricular prominence consistent with superficial and central atrophy. No intraparenchymal hemorrhage, mass effect nor midline shift. Periventricular and subcortical white matter hypodensities consistent with chronic small vessel ischemic disease are identified. No acute large vascular territory infarcts. No abnormal extra-axial fluid collections. Basal cisterns are not effaced and midline. VASCULAR: Moderate calcific atherosclerosis of the carotid siphons. SKULL: No skull fracture. No significant scalp soft tissue swelling. SINUSES/ORBITS: The mastoid air-cells are clear. The included paranasal sinuses are well-aerated.The included ocular globes and orbital contents are non-suspicious. OTHER: None. IMPRESSION: 1. Chronic moderate degree small vessel ischemia with cerebral atrophy. 2. No acute intracranial abnormality. Electronically Signed   By: Shanon Brow  Randel Pigg M.D.   On: 09/04/2016 22:12   Mr Brain Wo Contrast  Result Date: 09/12/2016 CLINICAL DATA:  Altered mental status EXAM: MRI HEAD WITHOUT CONTRAST TECHNIQUE: Multiplanar, multiecho pulse sequences of the brain and surrounding structures were obtained without intravenous contrast. COMPARISON:  CT  brain 09/04/2016 FINDINGS: Brain: The midline structures are normal. There is no focal diffusion restriction to indicate acute infarct. There is beginning confluent hyperintense T2-weighted signal within the periventricular white matter, most often seen in the setting of chronic microvascular ischemia. No intraparenchymal hematoma or chronic microhemorrhage. Brain volume is normal for age without age-advanced or lobar predominant atrophy. The dura is normal and there is no extra-axial collection. Vascular: Major intracranial arterial and venous sinus flow voids are preserved. Skull and upper cervical spine: The visualized skull base, calvarium, upper cervical spine and extracranial soft tissues are normal. Sinuses/Orbits: No fluid levels or advanced mucosal thickening. No mastoid effusion. 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 IMPRESSION: 1. No acute intracranial abnormality. 2. Advanced white matter disease, most commonly chronic microvascular ischemia. 3. Bilateral exophthalmos, in keeping with thyroid ophthalmopathy. Electronically Signed   By: Ulyses Jarred M.D.   On: 09/12/2016 02:47   US Renal  Result Date: 09/17/2016 EXAM: RENAL / URINARY TRACT ULTRASOUND COMPLETE COMPARISON:  Ultrasound 05/05/2016 FINDINGS: Right Kidney: Length: 3.6 cm. Diffuse increased echogenicity consistent with medical renal disease. Relatively normal cortical thickness and no hydronephrosis. Left Kidney: Length: 9.4 cm. Increased echogenicity suggesting medical renal disease. Normal renal cortical thickness and no hydronephrosis. Small renal cysts are stable. Bladder: Mild diffuse bladder wall thickening and some bladder debris. No mass. Other: Bilateral pleural effusions are noted. IMPRESSION: Increased echogenicity of both kidneys consistent with medical renal disease. No findings for hydronephrosis. Mild diffuse bladder wall thickening. Electronically Signed   By: Marijo Sanes M.D.   On:  09/17/2016 16:41   Dg Chest Port 1 View  Result Date: 09/20/2016 CLINICAL DATA:  83 year old with chronic kidney disease. Possible volume overload. EXAM: PORTABLE CHEST 1 VIEW COMPARISON:  09/06/2016, 09/04/2016 and earlier, including CT chest 05/12/2016 and earlier. FINDINGS: Cardiac silhouette moderately to markedly enlarged, unchanged. Thoracic aorta atherosclerotic, unchanged. Hilar and mediastinal contours otherwise unremarkable. Pulmonary vascularity normal without evidence of pulmonary edema. Atelectasis involving the left lung base. Lungs otherwise clear. IMPRESSION: Atelectasis involving the left lung base. No acute cardiopulmonary disease otherwise. Stable cardiomegaly without pulmonary edema. Electronically Signed   By: Evangeline Dakin M.D.   On: 09/20/2016 11:27   Dg Chest Port 1 View  Result Date: 09/06/2016 CLINICAL DATA:  Respiratory failure EXAM: PORTABLE CHEST 1 VIEW COMPARISON:  09/04/2016 FINDINGS: Improvement in vascular congestion. Cardiac enlargement. Negative for edema or effusion. Mild left lower lobe airspace disease has developed since the prior study most likely atelectasis. IMPRESSION: Improving vascular congestion. Mild left lower lobe atelectasis. Electronically Signed   By: Franchot Gallo M.D.   On: 09/06/2016 07:00    Time Spent in minutes  25   Louellen Molder M.D on 09/21/2016 at 12:52 PM  Between 7am to 7pm - Pager - [phone]  After 7pm go to [email or web address] - password [facility]  [facility] -  Office  [phone]

## 2016-09-21 NOTE — Progress Notes (Signed)
Paged MD regarding pt getting out of bed frequently and pulling out IVs. RN requesting safety sitter, awaiting call back  Raniah Karan Leory Plowman

## 2016-09-21 NOTE — Progress Notes (Signed)
Patient very agitated, refused care, refused medications, tried to give Ativan but patient snatched this RNs hand and pulled the plunger  Out, medication was spilled on the floor., pulled out PIV, taking off telemetry. Frequently getting out of bed/chair, note that patient is high fall risk. MD made aware, ordered for a safety sitter. Can made aware. Offerred meds several times but still refusing. Will start new order of  IVF once patient will calm down.

## 2016-09-21 NOTE — Progress Notes (Signed)
Assess: 1 CKD stage IV baseline eGFR 17-20,24 hr urine clearance with CrCl of 17 earlier this hospitalization. He is followed by Dr. Justin Mend in clinic. He came in with worsening  AKI due to hypertensive emergency and was exacerbated by the addition of aggressive RAAS blockade 6/17.       Will follow renal fct, some improvement,  will give some volume, no dialysis at this time  2 Acute encephalopathy: improved but persistent  3 HTN encephalopathy: BP sub-optimal but  improved  4 Possible UTI: urine culture with multiple species; on CTX (6/21).  5. Bradycardia: sinus, no BB  Subjective: Interval History: More alert  Objective: Vital signs in last 24 hours: Temp:  [97.6 F (36.4 C)-98.1 F (36.7 C)] 98 F (36.7 C) (06/26 0611) Pulse Rate:  [42-49] 43 (06/26 0611) Resp:  [17-18] 17 (06/26 0611) BP: (84-169)/(47-101) 124/61 (06/26 0611) SpO2:  [95 %-99 %] 95 % (06/26 0611) Weight:  [62.4 kg (137 lb 9.6 oz)] 62.4 kg (137 lb 9.6 oz) (06/26 0611) Weight change: -0.091 kg (-3.2 oz)  Intake/Output from previous day: 06/25 0701 - 06/26 0700 In: 770 [P.O.:120; I.V.:650] Out: 275 [Urine:275] Intake/Output this shift: Total I/O In: 120 [P.O.:120] Out: 0   General appearance: alert and cooperative Chest wall: no tenderness Cardio: regular rate and rhythm, S1, S2 normal, no murmur, click, rub or gallop Extremities: extremities normal, atraumatic, no cyanosis or edema  Lab Results:  Recent Labs  09/19/16 0407  WBC 8.5  HGB 8.8*  HCT 26.7*  PLT 260   BMET:  Recent Labs  09/20/16 0941 09/21/16 0355  NA 135 137  K 5.3* 5.2*  CL 107 109  CO2 19* 22  GLUCOSE 129* 108*  BUN 129* 119*  CREATININE 4.81* 4.46*  CALCIUM 9.5 9.3   No results for input(s): PTH in the last 72 hours. Iron Studies: No results for input(s): IRON, TIBC, TRANSFERRIN, FERRITIN in the last 72 hours. Studies/Results: Dg Chest Port 1 View  Result Date: 09/20/2016 CLINICAL DATA:  83 year old  with chronic kidney disease. Possible volume overload. EXAM: PORTABLE CHEST 1 VIEW COMPARISON:  09/06/2016, 09/04/2016 and earlier, including CT chest 05/12/2016 and earlier. FINDINGS: Cardiac silhouette moderately to markedly enlarged, unchanged. Thoracic aorta atherosclerotic, unchanged. Hilar and mediastinal contours otherwise unremarkable. Pulmonary vascularity normal without evidence of pulmonary edema. Atelectasis involving the left lung base. Lungs otherwise clear. IMPRESSION: Atelectasis involving the left lung base. No acute cardiopulmonary disease otherwise. Stable cardiomegaly without pulmonary edema. Electronically Signed   By: Evangeline Dakin M.D.   On: 09/20/2016 11:27    Scheduled: . aspirin EC  81 mg Oral Daily  . atorvastatin  40 mg Oral Daily  . brimonidine  1 drop Both Eyes BID  . brinzolamide  1 drop Both Eyes BID  . doxazosin  8 mg Oral Daily  . feeding supplement (ENSURE ENLIVE)  237 mL Oral BID BM  . heparin  5,000 Units Subcutaneous Q8H  . hydrALAZINE  75 mg Oral TID  . levothyroxine  137 mcg Oral QAC breakfast  . multivitamin with minerals  1 tablet Oral Daily     LOS: 16 days   Tony Granquist C 09/21/2016,9:43 AM

## 2016-09-21 NOTE — Progress Notes (Signed)
Patient refusing CPAP for tonight, RT made pt aware if he changed his mind to call.

## 2016-09-21 NOTE — Progress Notes (Signed)
Patients blood pressure was really low according to measurements taken with the Dinamap (93/636, 71/52). Manual cuff resulted in 190/60, 200/70 per nurse in right and right/left arm. Charge nurse notified.Marland Kitchen

## 2016-09-21 NOTE — Progress Notes (Signed)
Patient calm and quiet, safety sitter at bedside still trying to get up frequently.  Ambulate in the hallway , sitted at the nurses station.

## 2016-09-21 NOTE — Progress Notes (Signed)
IVF ordered not started, MD aware.

## 2016-09-22 LAB — BASIC METABOLIC PANEL
Anion gap: 6 (ref 5–15)
BUN: 107 mg/dL — AB (ref 6–20)
CO2: 23 mmol/L (ref 22–32)
CREATININE: 3.96 mg/dL — AB (ref 0.61–1.24)
Calcium: 9.6 mg/dL (ref 8.9–10.3)
Chloride: 110 mmol/L (ref 101–111)
GFR, EST AFRICAN AMERICAN: 15 mL/min — AB (ref >60)
GFR, EST NON AFRICAN AMERICAN: 13 mL/min — AB (ref >60)
Glucose, Bld: 93 mg/dL (ref 65–99)
POTASSIUM: 5.2 mmol/L — AB (ref 3.5–5.1)
SODIUM: 139 mmol/L (ref 135–145)

## 2016-09-22 MED ORDER — HYDRALAZINE HCL 50 MG PO TABS
100.0000 mg | ORAL_TABLET | Freq: Three times a day (TID) | ORAL | Status: DC
  Administered 2016-09-22 – 2016-09-24 (×7): 100 mg via ORAL
  Filled 2016-09-22 (×7): qty 2

## 2016-09-22 MED ORDER — FUROSEMIDE 80 MG PO TABS
160.0000 mg | ORAL_TABLET | Freq: Once | ORAL | Status: AC
Start: 2016-09-22 — End: 2016-09-22
  Administered 2016-09-22: 160 mg via ORAL
  Filled 2016-09-22: qty 2

## 2016-09-22 MED ORDER — QUETIAPINE FUMARATE 25 MG PO TABS
25.0000 mg | ORAL_TABLET | Freq: Every day | ORAL | Status: DC
Start: 2016-09-22 — End: 2016-09-24
  Administered 2016-09-22 – 2016-09-23 (×2): 25 mg via ORAL
  Filled 2016-09-22 (×2): qty 1

## 2016-09-22 MED ORDER — ATORVASTATIN CALCIUM 10 MG PO TABS
10.0000 mg | ORAL_TABLET | Freq: Every day | ORAL | Status: DC
Start: 2016-09-22 — End: 2016-09-24
  Administered 2016-09-22 – 2016-09-24 (×3): 10 mg via ORAL
  Filled 2016-09-22 (×4): qty 1

## 2016-09-22 NOTE — Progress Notes (Signed)
Patient still sleepy and  calm  this morning, able to eat breakfast per safety sitter. Patient took his pills without difficulty.seen by MD.

## 2016-09-22 NOTE — Progress Notes (Signed)
SBP on  The 190's, given  scheduled meds. Pt on bed resting no complaints of any pain or discomfort..MD paged made aware.will monitor accordingly.

## 2016-09-22 NOTE — Progress Notes (Signed)
Patient calm and quiet. Safety sitter discontinued.

## 2016-09-22 NOTE — Progress Notes (Signed)
Spoke with MD and ordered to given the IV PRN dose.

## 2016-09-22 NOTE — Progress Notes (Signed)
Assess: 1 CKD stage IV baseline eGFR 17-20,24 hr urine clearance with CrCl of 17 earlier this hospitalization. He is followed by Dr. Justin Mend. AKI due to hypertensive emergency and was exacerbated by the addition of aggressive RAAS blockade 6/17. He has some volume overload with prelumbar edema & weight is up, BP up.  2 Acute encephalopathy: improved, prob underlying dementia  3 HTN encephalopathy: BP sub-optimal--increase hydralazine & Lasix x 1  4 Possible UTI: urine culture with multiple species; on CTX (6/21).  5. Bradycardia: sinus, no BB  Subjective: Interval History: confused  Objective: Vital signs in last 24 hours: Temp:  [97.8 F (36.6 C)-98.7 F (37.1 C)] 97.8 F (36.6 C) (06/27 0635) Pulse Rate:  [44-49] 44 (06/27 0635) Resp:  [18-20] 20 (06/27 0635) BP: (93-204)/(62-124) 100/80 (06/27 0635) SpO2:  [99 %-100 %] 100 % (06/27 0635) Weight:  [63.9 kg (140 lb 12.8 oz)] 63.9 kg (140 lb 12.8 oz) (06/27 0459) Weight change: 1.452 kg (3 lb 3.2 oz)  Intake/Output from previous day: 06/26 0701 - 06/27 0700 In: 1002 [P.O.:1002] Out: 0  Intake/Output this shift: Total I/O In: 240 [P.O.:240] Out: -   General appearance: alert, appears stated age and confused Back: symmetric, no curvature. ROM normal. No CVA tenderness., 2+ prelumbar edema Resp: clear to auscultation bilaterally Cardio: regular rate and rhythm, S1, S2 normal, no murmur, click, rub or gallop Extremities: extremities normal, atraumatic, no cyanosis or edema  Lab Results: No results for input(s): WBC, HGB, HCT, PLT in the last 72 hours. BMET:  Recent Labs  09/21/16 0355 09/22/16 0621  NA 137 139  K 5.2* 5.2*  CL 109 110  CO2 22 23  GLUCOSE 108* 93  BUN 119* 107*  CREATININE 4.46* 3.96*  CALCIUM 9.3 9.6   No results for input(s): PTH in the last 72 hours. Iron Studies: No results for input(s): IRON, TIBC, TRANSFERRIN, FERRITIN in the last 72 hours. Studies/Results: Dg Chest Port 1  View  Result Date: 09/20/2016 CLINICAL DATA:  83 year old with chronic kidney disease. Possible volume overload. EXAM: PORTABLE CHEST 1 VIEW COMPARISON:  09/06/2016, 09/04/2016 and earlier, including CT chest 05/12/2016 and earlier. FINDINGS: Cardiac silhouette moderately to markedly enlarged, unchanged. Thoracic aorta atherosclerotic, unchanged. Hilar and mediastinal contours otherwise unremarkable. Pulmonary vascularity normal without evidence of pulmonary edema. Atelectasis involving the left lung base. Lungs otherwise clear. IMPRESSION: Atelectasis involving the left lung base. No acute cardiopulmonary disease otherwise. Stable cardiomegaly without pulmonary edema. Electronically Signed   By: Evangeline Dakin M.D.   On: 09/20/2016 11:27    Scheduled: . aspirin EC  81 mg Oral Daily  . atorvastatin  10 mg Oral Daily  . brimonidine  1 drop Both Eyes BID  . brinzolamide  1 drop Both Eyes BID  . doxazosin  8 mg Oral Daily  . feeding supplement (ENSURE ENLIVE)  237 mL Oral BID BM  . heparin  5,000 Units Subcutaneous Q8H  . hydrALAZINE  75 mg Oral TID  . levothyroxine  137 mcg Oral QAC breakfast  . multivitamin with minerals  1 tablet Oral Daily  . QUEtiapine  25 mg Oral BID    LOS: 17 days   Melina Mosteller C 09/22/2016,9:20 AM

## 2016-09-22 NOTE — Progress Notes (Addendum)
PROGRESS NOTE                                                                                                                                                                                                             Patient Demographics:    Jeffrey Campbell, is a 83 y.o. male, DOB - 1932-05-16, JSH:[id number]  Admit date - 09/04/2016   Admitting Physician Luz Brazen, MD  Outpatient Primary MD for the patient is [name], MD  LOS - [place]  Chief Complaint  Patient presents with  . Altered Mental Status       Brief Narrative   83 year old male with carotid artery stenosis, chronic kidney disease stage III, OSA, type 2 diabetes mellitus, diastolic dysfunction, pulmonary hypertension, OSA who was admitted with hypertensive encephalopathy. Patient was also found to be bradycardic in the 40s. He was admitted to the ICU on the cart. 2-D echo showed normal EF with grade 1 diastolic dysfunction and severely dilated LA. MRI of the brain was negative for stroke antral chronic microvascular changes. Patient transferred to telemetry. Hospital course prolonged due to worsening renal function and mental status changes.   Subjective:   Blood pressure elevated today. Patient is much comment better oriented. He was sleepy than usual. Denies any pain..   Assessment  & Plan :   Principal problem Hypertensive emergency with encephalopathy Continue Cardura and hydralazine. Blood pressure elevated today. Given a dose of IV Lasix since patient appears volume overloaded. Also increased hydralazine dose. Nephrology recommends permissive hypertension and worsened kidney injury may be contributed by overcorrection of hypertension causing renal hypoperfusion.  Acute on chronic kidney disease stage IV Worsened renal function due to hypertensive emergency and aggravated with use of ACE inhibitor on 6/17.    Creatinine peaked at 5.16 and slowly improving.    Acute metabolic encephalopathy Combination of hypertensive encephalopathy on presentation, then developed UTI that was treated and possibly sundowning with some underlying dementia. Now on Seroquel with improvement (dose reduced as patient more sleepy). Safety sitter discontinued.  Junctional bradycardia Beta blocker discontinued. Heart rate stable in 40s to 50s  Hyperkalemia No issues on telemetry.  OSA Continue nighttime CPAP.  Hypothyroidism Continue Synthroid   Anemia of chronic kidney disease     Code Status : Full code  Family Communication  : None at  bedside  Disposition Plan  : SNF tomorrow if renal function continues to improve  Barriers For Discharge : Acute kidney injury, encephalopathy  Consults  :   Nephrology  Procedures  :  Head CT MRI brain Renal ultrasound   DVT Prophylaxis  :  Heparin  Lab Results  Component Value Date   PLT 260 09/19/2016    Antibiotics  :    Anti-infectives    Start     Dose/Rate Route Frequency Ordered Stop   09/16/16 2000  cefTRIAXone (ROCEPHIN) 1 g in dextrose 5 % 50 mL IVPB     1 g 100 mL/hr over 30 Minutes Intravenous Every 24 hours 09/16/16 1907 09/20/16 2030   09/16/16 1915  cefTRIAXone (ROCEPHIN) injection 1 g  Status:  Discontinued     1 g Intramuscular Every 24 hours 09/16/16 1902 09/16/16 1906        Objective:   Vitals:   09/21/16 2321 09/22/16 0459 09/22/16 0635 09/22/16 1154  BP: (!) 204/62  100/80 (!) 178/141  Pulse:   (!) 44 (!) 43  Resp:   20 18  Temp:   97.8 F (36.6 C) 97.9 F (36.6 C)  TempSrc:   Oral Oral  SpO2:   100% 99%  Weight:  63.9 kg (140 lb 12.8 oz)    Height:        Wt Readings from Last 3 Encounters:  09/22/16 63.9 kg (140 lb 12.8 oz)  07/28/16 62.6 kg (138 lb)  07/21/16 63 kg (138 lb 12.8 oz)     Intake/Output Summary (Last 24 hours) at 09/22/16 1356 Last data filed at 09/22/16 0840  Gross per 24 hour  Intake               882 ml  Output                0 ml  Net              882 ml     Physical Exam Gen.: Elderly male, not in distress HEENT: Moist mucosa, supple neck Chest: Fine basilar crackles CVS: S1 and S2 bradycardic, no murmurs GI: Soft, nondistended, nontender Musculoskeletal: Trace pedal edema CNS: Alert and oriented 2     Data Review:    CBC  Recent Labs Lab 09/18/16 0905 09/19/16 0407  WBC 10.0 8.5  HGB 8.9* 8.8*  HCT 26.9* 26.7*  PLT 252 260  MCV 92.1 93.0  MCH 30.5 30.7  MCHC 33.1 33.0  RDW 15.2 15.1    Chemistries   Recent Labs Lab 09/18/16 0905 09/19/16 0407 09/20/16 0941 09/21/16 0355 09/22/16 0621  NA 134* 136 135 137 139  K 4.9 4.9 5.3* 5.2* 5.2*  CL 106 108 107 109 110  CO2 20* 22 19* 22 23  GLUCOSE 153* 117* 129* 108* 93  BUN 124* 131* 129* 119* 107*  CREATININE 4.91* 5.16* 4.81* 4.46* 3.96*  CALCIUM 9.1 9.0 9.5 9.3 9.6   ------------------------------------------------------------------------------------------------------------------ No results for input(s): CHOL, HDL, LDLCALC, TRIG, CHOLHDL, LDLDIRECT in the last 72 hours.  Lab Results  Component Value Date   HGBA1C 5.5 09/19/2016   ------------------------------------------------------------------------------------------------------------------ No results for input(s): TSH, T4TOTAL, T3FREE, THYROIDAB in the last 72 hours.  Invalid input(s): FREET3 ------------------------------------------------------------------------------------------------------------------ No results for input(s): VITAMINB12, FOLATE, FERRITIN, TIBC, IRON, RETICCTPCT in the last 72 hours.  Coagulation profile No results for input(s): INR, PROTIME in the last 168 hours.  No results for input(s): DDIMER in the last 72 hours.  Cardiac Enzymes No  results for input(s): CKMB, TROPONINI, MYOGLOBIN in the last 168 hours.  Invalid input(s):  CK ------------------------------------------------------------------------------------------------------------------    Component Value Date/Time   BNP 801.8 (H) 03/03/2015 1215   BNP 620.8 (H) 01/23/2015 0944    Inpatient Medications  Scheduled Meds: . aspirin EC  81 mg Oral Daily  . atorvastatin  10 mg Oral Daily  . brimonidine  1 drop Both Eyes BID  . brinzolamide  1 drop Both Eyes BID  . doxazosin  8 mg Oral Daily  . feeding supplement (ENSURE ENLIVE)  237 mL Oral BID BM  . heparin  5,000 Units Subcutaneous Q8H  . hydrALAZINE  100 mg Oral TID  . levothyroxine  137 mcg Oral QAC breakfast  . multivitamin with minerals  1 tablet Oral Daily  . QUEtiapine  25 mg Oral QHS   Continuous Infusions:  PRN Meds:.acetaminophen, hydrALAZINE, LORazepam  Micro Results Recent Results (from the past 240 hour(s))  Culture, Urine     Status: Abnormal   Collection Time: 09/16/16  6:21 PM  Result Value Ref Range Status   Specimen Description URINE, RANDOM  Final   Special Requests NONE  Final   Culture MULTIPLE SPECIES PRESENT, SUGGEST RECOLLECTION (A)  Final   Report Status 09/17/2016 FINAL  Final  Culture, Urine     Status: Abnormal   Collection Time: 09/19/16 12:21 PM  Result Value Ref Range Status   Specimen Description URINE, CLEAN CATCH  Final   Special Requests NONE  Final   Culture 20,000 COLONIES/mL PSEUDOMONAS AERUGINOSA (A)  Final   Report Status 09/21/2016 FINAL  Final   Organism ID, Bacteria PSEUDOMONAS AERUGINOSA (A)  Final      Susceptibility   Pseudomonas aeruginosa - MIC*    CEFTAZIDIME <=1 SENSITIVE Sensitive     CIPROFLOXACIN <=0.25 SENSITIVE Sensitive     GENTAMICIN <=1 SENSITIVE Sensitive     IMIPENEM 2 SENSITIVE Sensitive     PIP/TAZO <=4 SENSITIVE Sensitive     CEFEPIME <=1 SENSITIVE Sensitive     * 20,000 COLONIES/mL PSEUDOMONAS AERUGINOSA    Radiology Reports Dg Chest 2 View  Result Date: 09/04/2016 CLINICAL DATA:  Unsteady while standing. Cough and  altered mental status. EXAM: CHEST  2 VIEW COMPARISON:  01/28/2015 CXR, chest CT 05/12/2016 FINDINGS: Stable cardiomegaly with aortic atherosclerosis. Mild central vascular congestion. No pneumonic consolidation, effusion or pneumothorax. No dominant mass is seen. No acute nor suspicious osseous abnormality. IMPRESSION: Cardiomegaly with aortic atherosclerosis. Mild vascular congestion since prior exam is noted. Electronically Signed   By: Ashley Royalty M.D.   On: 09/04/2016 22:42   Ct Head Wo Contrast  Result Date: 09/04/2016 CLINICAL DATA:  Altered mental status EXAM: CT HEAD WITHOUT CONTRAST TECHNIQUE: Contiguous axial images were obtained from the base of the skull through the vertex without intravenous contrast. COMPARISON:  None. FINDINGS: BRAIN: There is sulcal and ventricular prominence consistent with superficial and central atrophy. No intraparenchymal hemorrhage, mass effect nor midline shift. Periventricular and subcortical white matter hypodensities consistent with chronic small vessel ischemic disease are identified. No acute large vascular territory infarcts. No abnormal extra-axial fluid collections. Basal cisterns are not effaced and midline. VASCULAR: Moderate calcific atherosclerosis of the carotid siphons. SKULL: No skull fracture. No significant scalp soft tissue swelling. SINUSES/ORBITS: The mastoid air-cells are clear. The included paranasal sinuses are well-aerated.The included ocular globes and orbital contents are non-suspicious. OTHER: None. IMPRESSION: 1. Chronic moderate degree small vessel ischemia with cerebral atrophy. 2. No acute intracranial abnormality. Electronically Signed  By: Ashley Royalty M.D.   On: 09/04/2016 22:12   Mr Brain Wo Contrast  Result Date: 09/12/2016 CLINICAL DATA:  Altered mental status EXAM: MRI HEAD WITHOUT CONTRAST TECHNIQUE: Multiplanar, multiecho pulse sequences of the brain and surrounding structures were obtained without intravenous contrast.  COMPARISON:  CT brain 09/04/2016 FINDINGS: Brain: The midline structures are normal. There is no focal diffusion restriction to indicate acute infarct. There is beginning confluent hyperintense T2-weighted signal within the periventricular white matter, most often seen in the setting of chronic microvascular ischemia. No intraparenchymal hematoma or chronic microhemorrhage. Brain volume is normal for age without age-advanced or lobar predominant atrophy. The dura is normal and there is no extra-axial collection. Vascular: Major intracranial arterial and venous sinus flow voids are preserved. Skull and upper cervical spine: The visualized skull base, calvarium, upper cervical spine and extracranial soft tissues are normal. Sinuses/Orbits: No fluid levels or advanced mucosal thickening. No mastoid effusion. 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 IMPRESSION: 1. No acute intracranial abnormality. 2. Advanced white matter disease, most commonly chronic microvascular ischemia. 3. Bilateral exophthalmos, in keeping with thyroid ophthalmopathy. Electronically Signed   By: Ulyses Jarred M.D.   On: 09/12/2016 02:47   US Renal  Result Date: 09/17/2016 EXAM: RENAL / URINARY TRACT ULTRASOUND COMPLETE COMPARISON:  Ultrasound 05/05/2016 FINDINGS: Right Kidney: Length: 3.6 cm. Diffuse increased echogenicity consistent with medical renal disease. Relatively normal cortical thickness and no hydronephrosis. Left Kidney: Length: 9.4 cm. Increased echogenicity suggesting medical renal disease. Normal renal cortical thickness and no hydronephrosis. Small renal cysts are stable. Bladder: Mild diffuse bladder wall thickening and some bladder debris. No mass. Other: Bilateral pleural effusions are noted. IMPRESSION: Increased echogenicity of both kidneys consistent with medical renal disease. No findings for hydronephrosis. Mild diffuse bladder wall thickening. Electronically Signed   By: Marijo Sanes M.D.   On: 09/17/2016 16:41   Dg Chest Port 1 View  Result Date: 09/20/2016 CLINICAL DATA:  83 year old with chronic kidney disease. Possible volume overload. EXAM: PORTABLE CHEST 1 VIEW COMPARISON:  09/06/2016, 09/04/2016 and earlier, including CT chest 05/12/2016 and earlier. FINDINGS: Cardiac silhouette moderately to markedly enlarged, unchanged. Thoracic aorta atherosclerotic, unchanged. Hilar and mediastinal contours otherwise unremarkable. Pulmonary vascularity normal without evidence of pulmonary edema. Atelectasis involving the left lung base. Lungs otherwise clear. IMPRESSION: Atelectasis involving the left lung base. No acute cardiopulmonary disease otherwise. Stable cardiomegaly without pulmonary edema. Electronically Signed   By: Evangeline Dakin M.D.   On: 09/20/2016 11:27   Dg Chest Port 1 View  Result Date: 09/06/2016 CLINICAL DATA:  Respiratory failure EXAM: PORTABLE CHEST 1 VIEW COMPARISON:  09/04/2016 FINDINGS: Improvement in vascular congestion. Cardiac enlargement. Negative for edema or effusion. Mild left lower lobe airspace disease has developed since the prior study most likely atelectasis. IMPRESSION: Improving vascular congestion. Mild left lower lobe atelectasis. Electronically Signed   By: Franchot Gallo M.D.   On: 09/06/2016 07:00    Time Spent in minutes  25   Louellen Molder M.D on 09/22/2016 at 1:56 PM  Between 7am to 7pm - Pager - [phone]  After 7pm go to [email or web address] - password [facility]  [facility] -  Office  [phone]

## 2016-09-23 DIAGNOSIS — I701 Atherosclerosis of renal artery: Secondary | ICD-10-CM

## 2016-09-23 DIAGNOSIS — I169 Hypertensive crisis, unspecified: Secondary | ICD-10-CM

## 2016-09-23 LAB — BASIC METABOLIC PANEL
ANION GAP: 8 (ref 5–15)
BUN: 106 mg/dL — ABNORMAL HIGH (ref 6–20)
CHLORIDE: 108 mmol/L (ref 101–111)
CO2: 23 mmol/L (ref 22–32)
Calcium: 9.5 mg/dL (ref 8.9–10.3)
Creatinine, Ser: 3.91 mg/dL — ABNORMAL HIGH (ref 0.61–1.24)
GFR calc non Af Amer: 13 mL/min — ABNORMAL LOW (ref >60)
GFR, EST AFRICAN AMERICAN: 15 mL/min — AB (ref >60)
GLUCOSE: 85 mg/dL (ref 65–99)
Potassium: 5.2 mmol/L — ABNORMAL HIGH (ref 3.5–5.1)
Sodium: 139 mmol/L (ref 135–145)

## 2016-09-23 MED ORDER — AMLODIPINE BESYLATE 5 MG PO TABS
5.0000 mg | ORAL_TABLET | Freq: Every day | ORAL | Status: DC
  Administered 2016-09-23: 5 mg via ORAL
  Filled 2016-09-23: qty 1

## 2016-09-23 MED ORDER — SODIUM POLYSTYRENE SULFONATE 15 GM/60ML PO SUSP
15.0000 g | Freq: Once | ORAL | Status: AC
Start: 2016-09-23 — End: 2016-09-23
  Administered 2016-09-23: 15 g via ORAL
  Filled 2016-09-23: qty 60

## 2016-09-23 MED ORDER — AMLODIPINE BESYLATE 10 MG PO TABS
10.0000 mg | ORAL_TABLET | Freq: Every day | ORAL | Status: DC
  Administered 2016-09-24: 10 mg via ORAL
  Filled 2016-09-23: qty 1

## 2016-09-23 MED ORDER — FUROSEMIDE 80 MG PO TABS
100.0000 mg | ORAL_TABLET | Freq: Once | ORAL | Status: AC
  Administered 2016-09-23: 100 mg via ORAL
  Filled 2016-09-23: qty 1

## 2016-09-23 NOTE — Progress Notes (Signed)
Occupational Therapy Treatment Patient Details Name: Jeffrey Campbell MRN: [id number] DOB: 1932-04-07 Today's Date: 09/23/2016    History of present illness Pt adm with hypertensive emergency and acute encephalopathy. PMH - DM, CAD, CKD, etoh abuse, diastolic dysfunction   OT comments  Focus of session on balance and cognition. Pt attempting to perform functional mobility without use of RW but agreeable to use it when asked; performed with min guard assist. Pt able to identify location based on surroundings and attempted to use environmental cues (looking for calendar) when asked month/year. D/c plan remains appropriate. Will continue to follow acutely.   Follow Up Recommendations  SNF    Equipment Recommendations  None recommended by OT    Recommendations for Other Services      Precautions / Restrictions Precautions Precautions: Fall Restrictions Weight Bearing Restrictions: No       Mobility Bed Mobility               General bed mobility comments: Pt OOB in chair upon arrival  Transfers Overall transfer level: Needs assistance Equipment used: None Transfers: Sit to/from Stand Sit to Stand: Min guard         General transfer comment: min gaurd for balance    Balance Overall balance assessment: Needs assistance Sitting-balance support: No upper extremity supported;Feet supported Sitting balance-Leahy Scale: Good     Standing balance support: No upper extremity supported;During functional activity Standing balance-Leahy Scale: Fair Standing balance comment: pt unsteady and cannot tolerate challenges                      ADL either performed or assessed with clinical judgement   ADL Overall ADL's : Needs assistance/impaired                         Toilet Transfer: Min guard;Ambulation;RW           Functional mobility during ADLs: Min guard;Rolling walker General ADL Comments: Pt impulsive; attempting to get up from chair multiple  times without RW. Feels he does not need to use RW but agreeable when asked.     Vision       Perception     Praxis      Cognition Arousal/Alertness: Awake/alert Behavior During Therapy: Impulsive Overall Cognitive Status: Impaired/Different from baseline Area of Impairment: Orientation;Attention;Memory;Following commands;Safety/judgement;Problem solving                 Orientation Level: Disoriented to;Time;Situation Current Attention Level: Sustained Memory: Decreased recall of precautions;Decreased short-term memory Following Commands: Follows multi-step commands with increased time;Follows multi-step commands inconsistently Safety/Judgement: Decreased awareness of safety;Decreased awareness of deficits   Problem Solving: Slow processing;Decreased initiation;Requires verbal cues;Requires tactile cues;Difficulty sequencing          Exercises     Shoulder Instructions       General Comments      Pertinent Vitals/ Pain       Pain Assessment: No/denies pain  Home Living                                          Prior Functioning/Environment              Frequency  Min 2X/week        Progress Toward Goals  OT Goals(current goals can now be found in the care plan section)  Progress towards  OT goals: Progressing toward goals  Acute Rehab OT Goals Patient Stated Goal: Not stated OT Goal Formulation: With patient  Plan Discharge plan remains appropriate    Co-evaluation                 AM-PAC PT "6 Clicks" Daily Activity     Outcome Measure   Help from another person eating meals?: None Help from another person taking care of personal grooming?: A Little Help from another person toileting, which includes using toliet, bedpan, or urinal?: A Little Help from another person bathing (including washing, rinsing, drying)?: A Little Help from another person to put on and taking off regular upper body clothing?: A  Little Help from another person to put on and taking off regular lower body clothing?: A Little 6 Click Score: 19    End of Session Equipment Utilized During Treatment: Rolling walker  OT Visit Diagnosis: Unsteadiness on feet (R26.81);Other symptoms and signs involving cognitive function   Activity Tolerance Patient tolerated treatment well   Patient Left in chair;with call bell/phone within reach;with chair alarm set   Nurse Communication Mobility status        Time: 1601-1610 OT Time Calculation (min): 9 min  Charges: OT General Charges $OT Visit: 1 Procedure OT Treatments $Therapeutic Activity: 8-22 mins  Guenevere Roorda A. Ulice Brilliant, M.S., OTR/L Pager: [place] 09/23/2016, 4:23 PM

## 2016-09-23 NOTE — Progress Notes (Signed)
Wife at bedside verbalized that she wants  the test/work up done here prior to discharge.  She also wants to talk to  The hospital  MD  and the SW. MD was paged.

## 2016-09-23 NOTE — Progress Notes (Signed)
Assess: 1 CKD stage IV baseline eGFR 17-20. He is followed by Dr. Justin Mend. AKI due to hypertensive emergency and was exacerbated by the addition of aggressive RAAS blockade 6/17.  This is all consistent with renal artery stenosis suggested by renal duplex scan in October 2016.  He has some volume overload with prelumbar edema & weight is up, BP up. I will ask for an IR consultation to look at possible CO2 angio of renal arteries as treatment for the resistant BP ( I am not excited about this given his age, but he is failing med therapy and we canot use RAAS blockade or beta blockade due to bradycardia.)  2 Acute encephalopathy: improved, prob underlying dementia 3 HTN encephalopathy: BP sub-optimal--increase amlodipine and lasix x1 again 4 Possible UTI: urine culture with multiple species; on CTX (6/21). 5. Bradycardia: sinus, no BB  Subjective: Interval History: none.  Objective: Vital signs in last 24 hours: Temp:  [97.6 F (36.4 C)-98.5 F (36.9 C)] 98.5 F (36.9 C) (06/28 0507) Pulse Rate:  [43-49] 43 (06/28 0843) Resp:  [16-18] 16 (06/28 0507) BP: (102-194)/(44-148) 173/121 (06/28 0843) SpO2:  [99 %-100 %] 100 % (06/28 0507) Weight:  [59.3 kg (130 lb 12.8 oz)] 59.3 kg (130 lb 12.8 oz) (06/28 0507) Weight change: -4.536 kg (-10 lb)  Intake/Output from previous day: 06/27 0701 - 06/28 0700 In: 816 [P.O.:816] Out: 600 [Urine:600] Intake/Output this shift: Total I/O In: 120 [P.O.:120] Out: -   General appearance: alert and cooperative Resp: clear to auscultation bilaterally Chest wall: no tenderness, 1+ prelumbar edema Cardio: regular rate and rhythm, S1, S2 normal, no murmur, click, rub or gallop Extremities: extremities normal, atraumatic, no cyanosis or edema Unstable gait, uses walker Lab Results: No results for input(s): WBC, HGB, HCT, PLT in the last 72 hours. BMET:  Recent Labs  09/22/16 0621 09/23/16 0254  NA 139 139  K 5.2* 5.2*  CL 110 108  CO2 23 23   GLUCOSE 93 85  BUN 107* 106*  CREATININE 3.96* 3.91*  CALCIUM 9.6 9.5   No results for input(s): PTH in the last 72 hours. Iron Studies: No results for input(s): IRON, TIBC, TRANSFERRIN, FERRITIN in the last 72 hours. Studies/Results: No results found.  Scheduled: . amLODipine  5 mg Oral Daily  . aspirin EC  81 mg Oral Daily  . atorvastatin  10 mg Oral Daily  . brimonidine  1 drop Both Eyes BID  . brinzolamide  1 drop Both Eyes BID  . doxazosin  8 mg Oral Daily  . feeding supplement (ENSURE ENLIVE)  237 mL Oral BID BM  . heparin  5,000 Units Subcutaneous Q8H  . hydrALAZINE  100 mg Oral TID  . levothyroxine  137 mcg Oral QAC breakfast  . multivitamin with minerals  1 tablet Oral Daily  . QUEtiapine  25 mg Oral QHS    LOS: 18 days   Rhoda Waldvogel C 09/23/2016,10:02 AM

## 2016-09-23 NOTE — Progress Notes (Signed)
Physical Therapy Treatment Patient Details Name: JOVONTE COMMINS MRN: [id number] DOB: Nov 02, 1932 Today's Date: 09/23/2016    History of Present Illness Pt adm with hypertensive emergency and acute encephalopathy. PMH - DM, CAD, CKD, etoh abuse, diastolic dysfunction    PT Comments    Patient able to ambulate without AD with min A. Pt unsteady and cannot tolerate challenges to gait without assist for balance. Pt oriented to person. Current plan remains appropriate.    Follow Up Recommendations  SNF     Equipment Recommendations  Other (comment) (To be assessed)    Recommendations for Other Services       Precautions / Restrictions Precautions Precautions: Fall Restrictions Weight Bearing Restrictions: No    Mobility  Bed Mobility               General bed mobility comments: Pt OOB in chair upon arrival  Transfers Overall transfer level: Needs assistance Equipment used: None Transfers: Sit to/from Stand Sit to Stand: Min assist         General transfer comment: pt able to stand without assist; min A upon standing for balance; pt using bilat LE against chair to steady  Ambulation/Gait Ambulation/Gait assistance: Min assist Ambulation Distance (Feet): 250 Feet Assistive device: None Gait Pattern/deviations: Step-through pattern;Decreased step length - right;Decreased step length - left;Narrow base of support;Trunk flexed;Decreased dorsiflexion - right;Decreased dorsiflexion - left;Staggering left;Staggering right Gait velocity: decr   General Gait Details: assist for balance   Stairs            Wheelchair Mobility    Modified Rankin (Stroke Patients Only)       Balance Overall balance assessment: Needs assistance Sitting-balance support: No upper extremity supported;Feet supported Sitting balance-Leahy Scale: Fair     Standing balance support: No upper extremity supported;During functional activity Standing balance-Leahy Scale: Fair Standing  balance comment: pt unsteady and cannot tolerate challenges                 Standardized Balance Assessment Standardized Balance Assessment : Dynamic Gait Index   Dynamic Gait Index Level Surface: Moderate Impairment Change in Gait Speed: Mild Impairment Gait with Horizontal Head Turns: Moderate Impairment Gait with Vertical Head Turns: Moderate Impairment Gait and Pivot Turn: Mild Impairment Step Over Obstacle: Mild Impairment Step Around Obstacles: Mild Impairment      Cognition Arousal/Alertness: Awake/alert Behavior During Therapy: WFL for tasks assessed/performed Overall Cognitive Status: Impaired/Different from baseline Area of Impairment: Orientation;Attention;Memory;Following commands;Safety/judgement;Problem solving                 Orientation Level: Disoriented to;Place;Situation Current Attention Level: Sustained Memory: Decreased recall of precautions;Decreased short-term memory Following Commands: Follows multi-step commands with increased time;Follows multi-step commands inconsistently Safety/Judgement: Decreased awareness of safety;Decreased awareness of deficits   Problem Solving: Slow processing;Decreased initiation;Requires verbal cues;Requires tactile cues;Difficulty sequencing        Exercises      General Comments        Pertinent Vitals/Pain Pain Assessment: No/denies pain    Home Living                      Prior Function            PT Goals (current goals can now be found in the care plan section) Progress towards PT goals: Progressing toward goals    Frequency    Min 2X/week      PT Plan Current plan remains appropriate    Co-evaluation  AM-PAC PT "6 Clicks" Daily Activity  Outcome Measure  Difficulty turning over in bed (including adjusting bedclothes, sheets and blankets)?: A Little Difficulty moving from lying on back to sitting on the side of the bed? : Total Difficulty sitting  down on and standing up from a chair with arms (e.g., wheelchair, bedside commode, etc,.)?: Total Help needed moving to and from a bed to chair (including a wheelchair)?: A Little Help needed walking in hospital room?: A Little Help needed climbing 3-5 steps with a railing? : A Little 6 Click Score: 14    End of Session Equipment Utilized During Treatment: Gait belt Activity Tolerance: Patient tolerated treatment well Patient left: in chair;with call bell/phone within reach;with chair alarm set Nurse Communication: Mobility status PT Visit Diagnosis: Unsteadiness on feet (R26.81);Muscle weakness (generalized) (M62.81)     Time: 6333-5456 PT Time Calculation (min) (ACUTE ONLY): 18 min  Charges:  $Gait Training: 8-22 mins                    G Codes:       Earney Navy, PTA Pager: [phone]     Darliss Cheney 09/23/2016, 3:08 PM

## 2016-09-23 NOTE — Progress Notes (Signed)
IR aware of request for possible renal angiogram secondary to hypertension resulting in encephalopathy.  He had a renal duplex in 2016 with possible RAS.  I have seen the patient and discussed the steps in order to get to an angiogram.  The patient seems to understand this as he is oriented to all but time.  He states he does NOT want to pursue this work up at this time.  He would like to follow up with Dr. Justin Mend as an outpatient to further discuss this possibility and whether he feels it should be pursued.  If Dr. Justin Mend and the patient, at that time feel they want to pursue this, then we are happy to see the patient in clinic to further discuss work up and evaluation.  Jeffrey Campbell E 1:54 PM 09/23/2016

## 2016-09-23 NOTE — Progress Notes (Signed)
PROGRESS NOTE                                                                                                                                                                                                             Patient Demographics:    Jeffrey Campbell, is a 83 y.o. male, DOB - [birth year], CNO:[id number]  Admit date - 09/04/2016   Admitting Physician Luz Brazen, MD  Outpatient Primary MD for the patient is [name], MD  [facility] - [place]  Chief Complaint  Patient presents with  . Altered Mental Status       Brief Narrative   83 year old male with carotid artery stenosis, chronic kidney disease stage III, OSA, type 2 diabetes mellitus, diastolic dysfunction, pulmonary hypertension, OSA who was admitted with hypertensive encephalopathy. Patient was also found to be bradycardic in the 40s. He was admitted to the ICU on the cart. 2-D echo showed normal EF with grade 1 diastolic dysfunction and severely dilated LA. MRI of the brain was negative for stroke antral chronic microvascular changes. Patient transferred to telemetry. Hospital course prolonged due to worsening renal function and mental status changes.   Subjective:   Blood pressure elevated today. Patient is much comment better oriented. He was sleepy than usual. Denies any pain..   Assessment  & Plan :   Principal problem Hypertensive emergency with encephalopathy Blood pressure continues to remain high. Increased hydralazine dose and continue Cardura without much effect. Continue when necessary IV hydralazine. Added amlodipine. We were allowing permissive hypertension resuming his worsened kidney function might have been due to overcorrection of his hypertension. Since his renal function worsened due to addition of ACE inhibitor, nephrology suspect this could be due to renal artery stenosis which was seen on renal duplex in  2016. IR consulted for possible CO2 angiogram of the renal arteries to treat resistant blood pressure.  Acute on chronic kidney disease stage IV Worsened renal function due to hypertensive emergency and aggravated with use of ACE inhibitor on 6/17. ? Possible worsening of renal artery stenosis. Creatinine peaked at 5.16 and slowly improving.    Acute metabolic encephalopathy Combination of hypertensive encephalopathy on presentation, then developed UTI that was treated and possibly sundowning with some underlying dementia. Improving after addition of low-dose Seroquel.  Junctional bradycardia Beta blocker discontinued. Heart rate stable in  40s to 50s  Hyperkalemia No issues on telemetry. Ordered Kayexalate today.  OSA Continue nighttime CPAP.  Hypothyroidism Continue Synthroid   Anemia of chronic kidney disease     Code Status : Full code  Family Communication  : None at bedside  Disposition Plan  : Pending IR evaluation for? RAS. SNF once BP stable and cleared by renal  Barriers For Discharge : Acute kidney injury, hypertensive crisis.  Consults  :   Nephrology  Procedures  :  Head CT MRI brain Renal ultrasound   DVT Prophylaxis  :  Heparin  Lab Results  Component Value Date   PLT 260 09/19/2016    Antibiotics  :    Anti-infectives    Start     Dose/Rate Route Frequency Ordered Stop   09/16/16 2000  cefTRIAXone (ROCEPHIN) 1 g in dextrose 5 % 50 mL IVPB     1 g 100 mL/hr over 30 Minutes Intravenous Every 24 hours 09/16/16 1907 09/20/16 2030   09/16/16 1915  cefTRIAXone (ROCEPHIN) injection 1 g  Status:  Discontinued     1 g Intramuscular Every 24 hours 09/16/16 1902 09/16/16 1906        Objective:   Vitals:   09/23/16 0843 09/23/16 1057 09/23/16 1109 09/23/16 1238  BP: (!) 173/121 (!) 189/154 (!) 190/140 (!) 86/71  Pulse: (!) 43 (!) 49  (!) 58  Resp:      Temp:      TempSrc:      SpO2:    100%  Weight:      Height:        Wt Readings  from Last 3 Encounters:  09/23/16 59.3 kg (130 lb 12.8 oz)  07/28/16 62.6 kg (138 lb)  07/21/16 63 kg (138 lb 12.8 oz)     Intake/Output Summary (Last 24 hours) at 09/23/16 1301 Last data filed at 09/23/16 0944  Gross per 24 hour  Intake              696 ml  Output              600 ml  Net               96 ml     Physical Exam Gen.: Elderly male in no acute distress HEENT: Moist mucosa, supple neck Chest: Clear bilaterally CVS: S1 and S2 bradycardic, no murmurs GI: Soft, nondistended, nontender Musculoskeletal: Warm, no edema CNS: Alert and oriented 2     Data Review:    CBC  Recent Labs Lab 09/18/16 0905 09/19/16 0407  WBC 10.0 8.5  HGB 8.9* 8.8*  HCT 26.9* 26.7*  PLT 252 260  MCV 92.1 93.0  MCH 30.5 30.7  MCHC 33.1 33.0  RDW 15.2 15.1    Chemistries   Recent Labs Lab 09/19/16 0407 09/20/16 0941 09/21/16 0355 09/22/16 0621 09/23/16 0254  NA 136 135 137 139 139  K 4.9 5.3* 5.2* 5.2* 5.2*  CL 108 107 109 110 108  CO2 22 19* 22 23 23   GLUCOSE 117* 129* 108* 93 85  BUN 131* 129* 119* 107* 106*  CREATININE 5.16* 4.81* 4.46* 3.96* 3.91*  CALCIUM 9.0 9.5 9.3 9.6 9.5   ------------------------------------------------------------------------------------------------------------------ No results for input(s): CHOL, HDL, LDLCALC, TRIG, CHOLHDL, LDLDIRECT in the last 72 hours.  Lab Results  Component Value Date   HGBA1C 5.5 09/19/2016   ------------------------------------------------------------------------------------------------------------------ No results for input(s): TSH, T4TOTAL, T3FREE, THYROIDAB in the last 72 hours.  Invalid input(s): FREET3 ------------------------------------------------------------------------------------------------------------------ No results for  input(s): VITAMINB12, FOLATE, FERRITIN, TIBC, IRON, RETICCTPCT in the last 72 hours.  Coagulation profile No results for input(s): INR, PROTIME in the last 168 hours.  No  results for input(s): DDIMER in the last 72 hours.  Cardiac Enzymes No results for input(s): CKMB, TROPONINI, MYOGLOBIN in the last 168 hours.  Invalid input(s): CK ------------------------------------------------------------------------------------------------------------------    Component Value Date/Time   BNP 801.8 (H) 03/03/2015 1215   BNP 620.8 (H) 01/23/2015 0944    Inpatient Medications  Scheduled Meds: . [START ON 09/24/2016] amLODipine  10 mg Oral Daily  . aspirin EC  81 mg Oral Daily  . atorvastatin  10 mg Oral Daily  . brimonidine  1 drop Both Eyes BID  . brinzolamide  1 drop Both Eyes BID  . doxazosin  8 mg Oral Daily  . feeding supplement (ENSURE ENLIVE)  237 mL Oral BID BM  . heparin  5,000 Units Subcutaneous Q8H  . hydrALAZINE  100 mg Oral TID  . levothyroxine  137 mcg Oral QAC breakfast  . multivitamin with minerals  1 tablet Oral Daily  . QUEtiapine  25 mg Oral QHS   Continuous Infusions:  PRN Meds:.acetaminophen, hydrALAZINE, LORazepam  Micro Results Recent Results (from the past 240 hour(s))  Culture, Urine     Status: Abnormal   Collection Time: 09/16/16  6:21 PM  Result Value Ref Range Status   Specimen Description URINE, RANDOM  Final   Special Requests NONE  Final   Culture MULTIPLE SPECIES PRESENT, SUGGEST RECOLLECTION (A)  Final   Report Status 09/17/2016 FINAL  Final  Culture, Urine     Status: Abnormal   Collection Time: 09/19/16 12:21 PM  Result Value Ref Range Status   Specimen Description URINE, CLEAN CATCH  Final   Special Requests NONE  Final   Culture 20,000 COLONIES/mL PSEUDOMONAS AERUGINOSA (A)  Final   Report Status 09/21/2016 FINAL  Final   Organism ID, Bacteria PSEUDOMONAS AERUGINOSA (A)  Final      Susceptibility   Pseudomonas aeruginosa - MIC*    CEFTAZIDIME <=1 SENSITIVE Sensitive     CIPROFLOXACIN <=0.25 SENSITIVE Sensitive     GENTAMICIN <=1 SENSITIVE Sensitive     IMIPENEM 2 SENSITIVE Sensitive     PIP/TAZO <=4  SENSITIVE Sensitive     CEFEPIME <=1 SENSITIVE Sensitive     * 20,000 COLONIES/mL PSEUDOMONAS AERUGINOSA    Radiology Reports Dg Chest 2 View  Result Date: 09/04/2016 CLINICAL DATA:  Unsteady while standing. Cough and altered mental status. EXAM: CHEST  2 VIEW COMPARISON:  01/28/2015 CXR, chest CT 05/12/2016 FINDINGS: Stable cardiomegaly with aortic atherosclerosis. Mild central vascular congestion. No pneumonic consolidation, effusion or pneumothorax. No dominant mass is seen. No acute nor suspicious osseous abnormality. IMPRESSION: Cardiomegaly with aortic atherosclerosis. Mild vascular congestion since prior exam is noted. Electronically Signed   By: Ashley Royalty M.D.   On: 09/04/2016 22:42   Ct Head Wo Contrast  Result Date: 09/04/2016 CLINICAL DATA:  Altered mental status EXAM: CT HEAD WITHOUT CONTRAST TECHNIQUE: Contiguous axial images were obtained from the base of the skull through the vertex without intravenous contrast. COMPARISON:  None. FINDINGS: BRAIN: There is sulcal and ventricular prominence consistent with superficial and central atrophy. No intraparenchymal hemorrhage, mass effect nor midline shift. Periventricular and subcortical white matter hypodensities consistent with chronic small vessel ischemic disease are identified. No acute large vascular territory infarcts. No abnormal extra-axial fluid collections. Basal cisterns are not effaced and midline. VASCULAR: Moderate calcific atherosclerosis of the carotid siphons.  SKULL: No skull fracture. No significant scalp soft tissue swelling. SINUSES/ORBITS: The mastoid air-cells are clear. The included paranasal sinuses are well-aerated.The included ocular globes and orbital contents are non-suspicious. OTHER: None. IMPRESSION: 1. Chronic moderate degree small vessel ischemia with cerebral atrophy. 2. No acute intracranial abnormality. Electronically Signed   By: Ashley Royalty M.D.   On: 09/04/2016 22:12   Mr Brain Wo Contrast  Result Date:  09/12/2016 CLINICAL DATA:  Altered mental status EXAM: MRI HEAD WITHOUT CONTRAST TECHNIQUE: Multiplanar, multiecho pulse sequences of the brain and surrounding structures were obtained without intravenous contrast. COMPARISON:  CT brain 09/04/2016 FINDINGS: Brain: The midline structures are normal. There is no focal diffusion restriction to indicate acute infarct. There is beginning confluent hyperintense T2-weighted signal within the periventricular white matter, most often seen in the setting of chronic microvascular ischemia. No intraparenchymal hematoma or chronic microhemorrhage. Brain volume is normal for age without age-advanced or lobar predominant atrophy. The dura is normal and there is no extra-axial collection. Vascular: Major intracranial arterial and venous sinus flow voids are preserved. Skull and upper cervical spine: The visualized skull base, calvarium, upper cervical spine and extracranial soft tissues are normal. Sinuses/Orbits: No fluid levels or advanced mucosal thickening. No mastoid effusion. 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 IMPRESSION: 1. No acute intracranial abnormality. 2. Advanced white matter disease, most commonly chronic microvascular ischemia. 3. Bilateral exophthalmos, in keeping with thyroid ophthalmopathy. Electronically Signed   By: Ulyses Jarred M.D.   On: 09/12/2016 02:47   US Renal  Result Date: 09/17/2016 EXAM: RENAL / URINARY TRACT ULTRASOUND COMPLETE COMPARISON:  Ultrasound 05/05/2016 FINDINGS: Right Kidney: Length: 3.6 cm. Diffuse increased echogenicity consistent with medical renal disease. Relatively normal cortical thickness and no hydronephrosis. Left Kidney: Length: 9.4 cm. Increased echogenicity suggesting medical renal disease. Normal renal cortical thickness and no hydronephrosis. Small renal cysts are stable. Bladder: Mild diffuse bladder wall thickening and some bladder debris. No mass. Other: Bilateral pleural  effusions are noted. IMPRESSION: Increased echogenicity of both kidneys consistent with medical renal disease. No findings for hydronephrosis. Mild diffuse bladder wall thickening. Electronically Signed   By: Marijo Sanes M.D.   On: 09/17/2016 16:41   Dg Chest Port 1 View  Result Date: 09/20/2016 CLINICAL DATA:  83 year old with chronic kidney disease. Possible volume overload. EXAM: PORTABLE CHEST 1 VIEW COMPARISON:  09/06/2016, 09/04/2016 and earlier, including CT chest 05/12/2016 and earlier. FINDINGS: Cardiac silhouette moderately to markedly enlarged, unchanged. Thoracic aorta atherosclerotic, unchanged. Hilar and mediastinal contours otherwise unremarkable. Pulmonary vascularity normal without evidence of pulmonary edema. Atelectasis involving the left lung base. Lungs otherwise clear. IMPRESSION: Atelectasis involving the left lung base. No acute cardiopulmonary disease otherwise. Stable cardiomegaly without pulmonary edema. Electronically Signed   By: Evangeline Dakin M.D.   On: 09/20/2016 11:27   Dg Chest Port 1 View  Result Date: 09/06/2016 CLINICAL DATA:  Respiratory failure EXAM: PORTABLE CHEST 1 VIEW COMPARISON:  09/04/2016 FINDINGS: Improvement in vascular congestion. Cardiac enlargement. Negative for edema or effusion. Mild left lower lobe airspace disease has developed since the prior study most likely atelectasis. IMPRESSION: Improving vascular congestion. Mild left lower lobe atelectasis. Electronically Signed   By: Franchot Gallo M.D.   On: 09/06/2016 07:00    Time Spent in minutes  25   Louellen Molder M.D on 09/23/2016 at 1:01 PM  Between 7am to 7pm - Pager - [phone]  After 7pm go to [email or web address] - password [facility]  [facility] -  Office  [phone]

## 2016-09-24 DIAGNOSIS — E039 Hypothyroidism, unspecified: Secondary | ICD-10-CM | POA: Diagnosis not present

## 2016-09-24 DIAGNOSIS — I674 Hypertensive encephalopathy: Secondary | ICD-10-CM | POA: Diagnosis not present

## 2016-09-24 DIAGNOSIS — M6281 Muscle weakness (generalized): Secondary | ICD-10-CM | POA: Diagnosis not present

## 2016-09-24 DIAGNOSIS — F339 Major depressive disorder, recurrent, unspecified: Secondary | ICD-10-CM | POA: Diagnosis not present

## 2016-09-24 DIAGNOSIS — N183 Chronic kidney disease, stage 3 (moderate): Secondary | ICD-10-CM | POA: Diagnosis not present

## 2016-09-24 DIAGNOSIS — H409 Unspecified glaucoma: Secondary | ICD-10-CM | POA: Diagnosis not present

## 2016-09-24 DIAGNOSIS — E119 Type 2 diabetes mellitus without complications: Secondary | ICD-10-CM

## 2016-09-24 DIAGNOSIS — I2510 Atherosclerotic heart disease of native coronary artery without angina pectoris: Secondary | ICD-10-CM | POA: Diagnosis not present

## 2016-09-24 DIAGNOSIS — I701 Atherosclerosis of renal artery: Secondary | ICD-10-CM | POA: Diagnosis not present

## 2016-09-24 DIAGNOSIS — J441 Chronic obstructive pulmonary disease with (acute) exacerbation: Secondary | ICD-10-CM | POA: Diagnosis not present

## 2016-09-24 DIAGNOSIS — F0151 Vascular dementia with behavioral disturbance: Secondary | ICD-10-CM

## 2016-09-24 DIAGNOSIS — R488 Other symbolic dysfunctions: Secondary | ICD-10-CM | POA: Diagnosis not present

## 2016-09-24 DIAGNOSIS — N170 Acute kidney failure with tubular necrosis: Secondary | ICD-10-CM | POA: Diagnosis not present

## 2016-09-24 DIAGNOSIS — G4733 Obstructive sleep apnea (adult) (pediatric): Secondary | ICD-10-CM | POA: Diagnosis not present

## 2016-09-24 DIAGNOSIS — I160 Hypertensive urgency: Secondary | ICD-10-CM | POA: Diagnosis not present

## 2016-09-24 DIAGNOSIS — E569 Vitamin deficiency, unspecified: Secondary | ICD-10-CM | POA: Diagnosis not present

## 2016-09-24 DIAGNOSIS — N184 Chronic kidney disease, stage 4 (severe): Secondary | ICD-10-CM | POA: Diagnosis not present

## 2016-09-24 DIAGNOSIS — E785 Hyperlipidemia, unspecified: Secondary | ICD-10-CM | POA: Diagnosis not present

## 2016-09-24 DIAGNOSIS — I509 Heart failure, unspecified: Secondary | ICD-10-CM | POA: Diagnosis not present

## 2016-09-24 DIAGNOSIS — F01518 Vascular dementia, unspecified severity, with other behavioral disturbance: Secondary | ICD-10-CM | POA: Diagnosis present

## 2016-09-24 DIAGNOSIS — R2689 Other abnormalities of gait and mobility: Secondary | ICD-10-CM | POA: Diagnosis not present

## 2016-09-24 DIAGNOSIS — J449 Chronic obstructive pulmonary disease, unspecified: Secondary | ICD-10-CM | POA: Diagnosis not present

## 2016-09-24 DIAGNOSIS — R4182 Altered mental status, unspecified: Secondary | ICD-10-CM | POA: Diagnosis not present

## 2016-09-24 DIAGNOSIS — I341 Nonrheumatic mitral (valve) prolapse: Secondary | ICD-10-CM | POA: Diagnosis not present

## 2016-09-24 DIAGNOSIS — N390 Urinary tract infection, site not specified: Secondary | ICD-10-CM | POA: Diagnosis not present

## 2016-09-24 DIAGNOSIS — R001 Bradycardia, unspecified: Secondary | ICD-10-CM | POA: Diagnosis not present

## 2016-09-24 DIAGNOSIS — I10 Essential (primary) hypertension: Secondary | ICD-10-CM | POA: Diagnosis not present

## 2016-09-24 LAB — BASIC METABOLIC PANEL
ANION GAP: 9 (ref 5–15)
BUN: 103 mg/dL — ABNORMAL HIGH (ref 6–20)
CO2: 25 mmol/L (ref 22–32)
Calcium: 9.1 mg/dL (ref 8.9–10.3)
Chloride: 106 mmol/L (ref 101–111)
Creatinine, Ser: 3.77 mg/dL — ABNORMAL HIGH (ref 0.61–1.24)
GFR calc Af Amer: 16 mL/min — ABNORMAL LOW (ref >60)
GFR, EST NON AFRICAN AMERICAN: 14 mL/min — AB (ref >60)
GLUCOSE: 96 mg/dL (ref 65–99)
POTASSIUM: 4.2 mmol/L (ref 3.5–5.1)
Sodium: 140 mmol/L (ref 135–145)

## 2016-09-24 MED ORDER — FUROSEMIDE 40 MG PO TABS: 40.0000 mg | ORAL_TABLET | Freq: Every day | ORAL | 3 refills | Status: DC | PRN

## 2016-09-24 MED ORDER — DOXAZOSIN MESYLATE 8 MG PO TABS: 8.0000 mg | ORAL_TABLET | Freq: Every day | ORAL | 0 refills | Status: DC

## 2016-09-24 MED ORDER — ATORVASTATIN CALCIUM 10 MG PO TABS: 10.0000 mg | ORAL_TABLET | Freq: Every day | ORAL | 0 refills | Status: DC

## 2016-09-24 MED ORDER — QUETIAPINE FUMARATE 25 MG PO TABS: 25.0000 mg | ORAL_TABLET | Freq: Every day | ORAL | 0 refills | Status: DC

## 2016-09-24 MED ORDER — ENSURE ENLIVE PO LIQD: 237.0000 mL | Freq: Two times a day (BID) | ORAL | 12 refills | Status: DC

## 2016-09-24 MED ORDER — AMLODIPINE BESYLATE 10 MG PO TABS: 10.0000 mg | ORAL_TABLET | Freq: Every day | ORAL | 0 refills | Status: DC

## 2016-09-24 NOTE — Clinical Social Work Placement (Signed)
   CLINICAL SOCIAL WORK PLACEMENT  NOTE  Date:  09/24/2016  Patient Details  Name: Jeffrey Campbell MRN: [id number] Date of Birth: 08/10/1932  Clinical Social Work is seeking post-discharge placement for this patient at the [place] level of care (*CSW will initial, date and re-position this form in  chart as items are completed):  Yes   Patient/family provided with [place] Work Department's list of facilities offering this level of care within the geographic area requested by the patient (or if unable, by the patient's family).  Yes   Patient/family informed of their freedom to choose among providers that offer the needed level of care, that participate in Medicare, Medicaid or managed care program needed by the patient, have an available bed and are willing to accept the patient.  Yes   Patient/family informed of [facility]'s ownership interest in [facility] and [facility], as well as of the fact that they are under no obligation to receive care at these facilities.  PASRR submitted to EDS on 09/13/16     PASRR number received on 09/13/16     Existing PASRR number confirmed on       FL2 transmitted to all facilities in geographic area requested by pt/family on 09/13/16     FL2 transmitted to all facilities within larger geographic area on       Patient informed that his/her managed care company has contracts with or will negotiate with certain facilities, including the following:        Yes   Patient/family informed of bed offers received.  Patient chooses bed at [facility]     Physician recommends and patient chooses bed at      Patient to be transferred to [facility] on 09/24/16.  Patient to be transferred to facility by Patient's wife will transport     Patient family notified on 09/24/16 of transfer.  Name of family member notified:  [name]     PHYSICIAN Please prepare prescriptions      Additional Comment:    _______________________________________________ Candie Chroman, LCSW 09/24/2016, 1:35 PM

## 2016-09-24 NOTE — Clinical Social Work Note (Signed)
CSW facilitated patient discharge including contacting patient family and facility to confirm patient discharge plans. Clinical information faxed to facility and family agreeable with plan. Patient's wife will transport patient by car to [employer]. RN to call report prior to discharge [phone]).  CSW will sign off for now as social work intervention is no longer needed. Please consult [place] again if new needs arise.  Dayton Scrape, [place]

## 2016-09-24 NOTE — Progress Notes (Signed)
I spoke to the patient's wife regarding this procedure as IR was informed patient could not make his own decisions.  After talking to Dr. Laurence Ferrari, this is not an emergent case and can be done as an outpatient.  I explained this to the wife and informed her that it would be more beneficial for them to have a clinic meeting with Dr. Laurence Ferrari where she and her husband can sit down and hear a full explanation of the procedure along with the risks vs benefits.  The wife was very agreeable to this and felt like this was a good plan.  I have set him up for Tuesday July 3 at 3:00pm with Dr. Laurence Ferrari in the clinic to discuss all of these things.  Our office will call his wife as well to inform her of the date and time too.  Dr. Clementeen Harwood has been updated with all of this info as well.  Burlin Mcnair E 8:49 AM 09/24/2016

## 2016-09-24 NOTE — Discharge Summary (Addendum)
Physician Discharge Summary  Jeffrey Campbell ZOX:[id number] DOB: 03-17-1933 DOA: 09/04/2016  PCP: [name], MD  Admit date: 09/04/2016 Discharge date: 09/24/2016  Admitted From: Home Disposition:  SNF ([facility])  Recommendations for Outpatient Follow-up:  1. Follow-up with M.D. at SNF in 1 week. Please check renal function in 2-3 days. 2. Patient has appointment to see intervention radiology Dr. Laurence Ferrari on 09/28/2016 at 3 PM. 3. Follow-up with Dr. Florene Glen (nephrology) in 2 weeks. 4. Please avoid any AV nodal blocking or rate limiting agents given significant bradycardia.  Home Health: None Equipment/Devices: None  Discharge Condition: Fair CODE STATUS: Full code Diet recommendation: Heart Healthy /carb modified   Discharge Diagnoses:  Principal Problem:   Hypertensive encephalopathy   Active Problems:   Hypertensive emergency   Type 2 diabetes, diet controlled (HCC)   Bradycardia   Diastolic dysfunction   Carotid artery stenosis   Pulmonary hypertension (HCC)   Chronic diastolic CHF (congestive heart failure) (HCC)   Bilateral renal artery stenosis (HCC)   Acute renal failure superimposed on stage 3 chronic kidney disease (HCC)   Vascular dementia with behavioral disturbance   Urinary tract infection  Brief narrative/history of present illness Please refer to admission H&P for details, in brief,83 year old male with carotid artery stenosis, chronic kidney disease stage III, OSA, type 2 diabetes mellitus, diastolic dysfunction, pulmonary hypertension, OSA who was admitted with hypertensive encephalopathy. Patient was also found to be bradycardic in the 40s. He was admitted to the ICU . Marland Kitchen 2-D echo showed normal EF with grade 1 diastolic dysfunction and severely dilated LA. MRI of the brain was negative for stroke antral chronic microvascular changes. Patient transferred to telemetry. Hospital course prolonged due to worsening renal function and mental status  changes.  Hospital course   Principal problem Hypertensive emergency with encephalopathy -Continue to have persistently elevated blood pressure. Increased hydralazine and Cardura dose with only some improvement. Added back amlodipine. ACE inhibitor was added at some point but worsened his renal function. We were allowing permissive hypertension resuming his worsened kidney function might have been due to overcorrection of his hypertension. Since his renal function worsened due to addition of ACE inhibitor, nephrology suspect this could be due to renal artery stenosis which was seen on renal duplex in 2016. IR consulted for possible CO2 angiogram of the renal arteries to treat resistant blood pressure.  When IR approached the patient he refused but upon discussing with his wife she wanted to have the procedure done. Patient is not capable to make complex medical decisions at this time. I have further discussed with patient's wife and since this is not emergent planned to be done as outpatient once they meet with patient in the office early next week. (Appointment has been scheduled).  Patient's blood pressure is stable on current blood pressure regimen. Continue Lasix as needed for shortness of breath or leg edema. Patient should follow-up with his nephrologist in 2 weeks.   Acute on chronic kidney disease stage IV Worsened renal function due to hypertensive emergency and aggravated with use of ACE inhibitor on 6/17. ? Possible worsening of renal artery stenosis. Creatinine peaked at 5.16 and slowly improving. (3.77 today). Follow renal function closely as outpatient.    Acute metabolic encephalopathy Combination of hypertensive encephalopathy on presentation, then developed UTI that was treated and possibly sundowning and worsening of underlying dementia..  Symptoms improving after addition of low-dose Seroquel. Wife reports that he has some underlying dementia and is around 80% of his  baseline  at present.  Junctional bradycardia Heart rate ranging in 40s-50s. Avoid AV nodal blocking agents.  Hyperkalemia No issues on telemetry. Improved with Kayexalate.  OSA Continue nighttime CPAP.  Hypothyroidism Continue Synthroid   Anemia of chronic kidney disease Stable.  History of carotid stenosis and chronic microvascular ischemia Continue Lipitor (dose reduced since last LDL of 53)  UTI Urine culture grew 20 K Pseudomonas, pansensitive. Treated with Rocephin for 5 days.   Patient seen by physical therapy and recommended SNF.  Family Communication  :  discussed with wife in detail  Disposition Plan  :  SNF    Consults  :   Nephrology Intervention radiology  Procedures  :  Head CT MRI brain Renal ultrasound  Discharge Instructions   Allergies as of 09/24/2016      Reactions   Tiotropium Bromide Monohydrate Other (See Comments)   Dry mouth   Budesonide-formoterol Fumarate    Dry mouth   Other    DYE- Causes kidney to shut down   Tricor [fenofibrate]    Unknown   Zocor [simvastatin] Other (See Comments)   Not sure Liver problems       Medication List    STOP taking these medications   amLODipine-atorvastatin 10-40 MG tablet Commonly known as:  CADUET     TAKE these medications   amLODipine 10 MG tablet Commonly known as:  NORVASC Take 1 tablet (10 mg total) by mouth daily.   aspirin EC 81 MG tablet Take 81 mg by mouth daily.   atorvastatin 10 MG tablet Commonly known as:  LIPITOR Take 1 tablet (10 mg total) by mouth daily.   doxazosin 8 MG tablet Commonly known as:  CARDURA Take 1 tablet (8 mg total) by mouth daily. What changed:  medication strength  See the new instructions.   feeding supplement (ENSURE ENLIVE) Liqd Take 237 mLs by mouth 2 (two) times daily between meals.   furosemide 40 MG tablet Commonly known as:  LASIX Take 1 tablet (40 mg total) by mouth daily as needed for fluid or edema. What  changed:  how much to take  when to take this  reasons to take this   hydrALAZINE 100 MG tablet Commonly known as:  APRESOLINE TAKE 1 TABLET (100 MG TOTAL) BY MOUTH 3 (THREE) TIMES DAILY. What changed:  how much to take  how to take this  when to take this  additional instructions   levothyroxine 137 MCG tablet Commonly known as:  SYNTHROID, LEVOTHROID Take 137 mcg by mouth daily before breakfast.   multivitamin with minerals Tabs tablet Take 1 tablet by mouth daily.   potassium chloride 10 MEQ tablet Commonly known as:  K-DUR,KLOR-CON Take 1 tablet (10 mEq total) by mouth daily.   QUEtiapine 25 MG tablet Commonly known as:  SEROQUEL Take 1 tablet (25 mg total) by mouth at bedtime.   SIMBRINZA 1-0.2 % Susp Generic drug:  Brinzolamide-Brimonidine Place 1 drop into both eyes 2 (two) times daily.       Contact information for follow-up providers    Jacqulynn Cadet, MD Follow up on 09/28/2016.   Specialties:  Interventional Radiology, Radiology Why:  arrive at 2:45pm for a 3:00pm appointment to discuss a procedure for your kidneys Contact information: [address] [place] [address] [phone]        [name], MD Follow up in 2 week(s).   Specialty:  Nephrology Contact information: [address]  [place] [address] [phone]            Contact information for after-discharge care    [facility] Follow up.   Specialty:  Skilled Nursing Facility Contact information: [address] [address] [phone]                 Allergies  Allergen Reactions  . Tiotropium Bromide Monohydrate Other (See Comments)    Dry mouth  . Budesonide-Formoterol Fumarate     Dry mouth  . Other     DYE- Causes kidney to shut down  . Tricor [Fenofibrate]     Unknown   . Zocor [Simvastatin] Other (See Comments)    Not sure Liver problems        Procedures/Studies: Dg Chest 2 View  Result Date: 09/04/2016 CLINICAL DATA:  Unsteady while standing. Cough and altered mental status. EXAM: CHEST  2 VIEW COMPARISON:  01/28/2015 CXR, chest CT 05/12/2016 FINDINGS: Stable cardiomegaly with aortic atherosclerosis. Mild central vascular congestion. No pneumonic consolidation, effusion or pneumothorax. No dominant mass is seen. No acute nor suspicious osseous abnormality. IMPRESSION: Cardiomegaly with aortic atherosclerosis. Mild vascular congestion since prior exam is noted. Electronically Signed   By: Ashley Royalty M.D.   On: 09/04/2016 22:42   Ct Head Wo Contrast  Result Date: 09/04/2016 CLINICAL DATA:  Altered mental status EXAM: CT HEAD WITHOUT CONTRAST TECHNIQUE: Contiguous axial images were obtained from the base of the skull through the vertex without intravenous contrast. COMPARISON:  None. FINDINGS: BRAIN: There is sulcal and ventricular prominence consistent with superficial and central atrophy. No intraparenchymal hemorrhage, mass effect nor midline shift. Periventricular and subcortical white matter hypodensities consistent with chronic small vessel ischemic disease are identified. No acute large vascular territory infarcts. No abnormal extra-axial fluid collections. Basal cisterns are not effaced and midline. VASCULAR: Moderate calcific atherosclerosis of the carotid siphons. SKULL: No skull fracture. No significant scalp soft tissue swelling. SINUSES/ORBITS: The mastoid air-cells are clear. The included paranasal sinuses are well-aerated.The included ocular globes and orbital contents are non-suspicious. OTHER: None. IMPRESSION: 1. Chronic moderate degree small vessel ischemia with cerebral atrophy. 2. No acute intracranial abnormality. Electronically Signed   By: Ashley Royalty M.D.   On: 09/04/2016 22:12   Mr Brain Wo Contrast  Result Date: 09/12/2016 CLINICAL DATA:  Altered mental status EXAM: MRI HEAD WITHOUT CONTRAST TECHNIQUE:  Multiplanar, multiecho pulse sequences of the brain and surrounding structures were obtained without intravenous contrast. COMPARISON:  CT brain 09/04/2016 FINDINGS: Brain: The midline structures are normal. There is no focal diffusion restriction to indicate acute infarct. There is beginning confluent hyperintense T2-weighted signal within the periventricular white matter, most often seen in the setting of chronic microvascular ischemia. No intraparenchymal hematoma or chronic microhemorrhage. Brain volume is normal for age without age-advanced or lobar predominant atrophy. The dura is normal and there is no extra-axial collection. Vascular: Major intracranial arterial and venous sinus flow voids are preserved. Skull and upper cervical spine: The visualized skull base, calvarium, upper cervical spine and extracranial soft tissues are normal. Sinuses/Orbits: No fluid levels or advanced mucosal thickening. No mastoid effusion. 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 IMPRESSION: 1. No acute intracranial abnormality. 2. Advanced white matter disease, most commonly chronic microvascular ischemia. 3. Bilateral exophthalmos, in keeping with thyroid ophthalmopathy. Electronically Signed   By: Ulyses Jarred M.D.   On: 09/12/2016 02:47   US Renal  Result Date: 09/17/2016 EXAM: RENAL / URINARY  TRACT ULTRASOUND COMPLETE COMPARISON:  Ultrasound 05/05/2016 FINDINGS: Right Kidney: Length: 3.6 cm. Diffuse increased echogenicity consistent with medical renal disease. Relatively normal cortical thickness and no hydronephrosis. Left Kidney: Length: 9.4 cm. Increased echogenicity suggesting medical renal disease. Normal renal cortical thickness and no hydronephrosis. Small renal cysts are stable. Bladder: Mild diffuse bladder wall thickening and some bladder debris. No mass. Other: Bilateral pleural effusions are noted. IMPRESSION: Increased echogenicity of both kidneys consistent with medical  renal disease. No findings for hydronephrosis. Mild diffuse bladder wall thickening. Electronically Signed   By: Marijo Sanes M.D.   On: 09/17/2016 16:41   Dg Chest Port 1 View  Result Date: 09/20/2016 CLINICAL DATA:  83 year old with chronic kidney disease. Possible volume overload. EXAM: PORTABLE CHEST 1 VIEW COMPARISON:  09/06/2016, 09/04/2016 and earlier, including CT chest 05/12/2016 and earlier. FINDINGS: Cardiac silhouette moderately to markedly enlarged, unchanged. Thoracic aorta atherosclerotic, unchanged. Hilar and mediastinal contours otherwise unremarkable. Pulmonary vascularity normal without evidence of pulmonary edema. Atelectasis involving the left lung base. Lungs otherwise clear. IMPRESSION: Atelectasis involving the left lung base. No acute cardiopulmonary disease otherwise. Stable cardiomegaly without pulmonary edema. Electronically Signed   By: Evangeline Dakin M.D.   On: 09/20/2016 11:27   Dg Chest Port 1 View  Result Date: 09/06/2016 CLINICAL DATA:  Respiratory failure EXAM: PORTABLE CHEST 1 VIEW COMPARISON:  09/04/2016 FINDINGS: Improvement in vascular congestion. Cardiac enlargement. Negative for edema or effusion. Mild left lower lobe airspace disease has developed since the prior study most likely atelectasis. IMPRESSION: Improving vascular congestion. Mild left lower lobe atelectasis. Electronically Signed   By: Franchot Gallo M.D.   On: 09/06/2016 07:00    2-D echo Study Conclusions  - Left ventricle: The cavity size was normal. There was mild   concentric hypertrophy. Systolic function was normal. The   estimated ejection fraction was in the range of 60% to 65%. Wall   motion was normal; there were no regional wall motion   abnormalities. Doppler parameters are consistent with abnormal   left ventricular relaxation (grade 1 diastolic dysfunction).   Doppler parameters are consistent with high ventricular filling   pressure. - Aortic valve: Transvalvular velocity  was within the normal range.   There was no stenosis. There was no regurgitation. - Mitral valve: Transvalvular velocity was within the normal range.   There was no evidence for stenosis. There was trivial   regurgitation. - Left atrium: The atrium was severely dilated. - Right ventricle: The cavity size was mildly dilated. Wall   thickness was normal. Systolic function was normal. - Right atrium: The atrium was severely dilated. - Tricuspid valve: There was mild regurgitation. - Pulmonary arteries: Systolic pressure was mildly increased. PA   peak pressure: 46 mm Hg (S).   Subjective: Denies any specific symptoms. No overnight events. Appears more oriented today.  Discharge Exam: Vitals:   09/23/16 1949 09/24/16 0412  BP: (!) 174/66 112/63  Pulse: (!) 54 (!) 45  Resp: 20 20  Temp: 98.2 F (36.8 C) 98.6 F (37 C)   Vitals:   09/23/16 1238 09/23/16 1300 09/23/16 1949 09/24/16 0412  BP: (!) 86/71 (!) 162/88 (!) 174/66 112/63  Pulse: (!) 58 (!) 46 (!) 54 (!) 45  Resp:   20 20  Temp:   98.2 F (36.8 C) 98.6 F (37 C)  TempSrc:   Oral Oral  SpO2: 100%  97% 98%  Weight:    58.3 kg (128 lb 8 oz)  Height:  Gen.: Elderly male in no acute distress HEENT: Moist mucosa, supple neck Chest: Clear bilaterally CVS: S1 and S2 bradycardic, no murmurs GI: Soft, nondistended, nontender Musculoskeletal: Warm, no edema CNS: Alert and oriented 2    The results of significant diagnostics from this hospitalization (including imaging, microbiology, ancillary and laboratory) are listed below for reference.     Microbiology: Recent Results (from the past 240 hour(s))  Culture, Urine     Status: Abnormal   Collection Time: 09/16/16  6:21 PM  Result Value Ref Range Status   Specimen Description URINE, RANDOM  Final   Special Requests NONE  Final   Culture MULTIPLE SPECIES PRESENT, SUGGEST RECOLLECTION (A)  Final   Report Status 09/17/2016 FINAL  Final  Culture, Urine      Status: Abnormal   Collection Time: 09/19/16 12:21 PM  Result Value Ref Range Status   Specimen Description URINE, CLEAN CATCH  Final   Special Requests NONE  Final   Culture 20,000 COLONIES/mL PSEUDOMONAS AERUGINOSA (A)  Final   Report Status 09/21/2016 FINAL  Final   Organism ID, Bacteria PSEUDOMONAS AERUGINOSA (A)  Final      Susceptibility   Pseudomonas aeruginosa - MIC*    CEFTAZIDIME <=1 SENSITIVE Sensitive     CIPROFLOXACIN <=0.25 SENSITIVE Sensitive     GENTAMICIN <=1 SENSITIVE Sensitive     IMIPENEM 2 SENSITIVE Sensitive     PIP/TAZO <=4 SENSITIVE Sensitive     CEFEPIME <=1 SENSITIVE Sensitive     * 20,000 COLONIES/mL PSEUDOMONAS AERUGINOSA     Labs: BNP (last 3 results) No results for input(s): BNP in the last 8760 hours. Basic Metabolic Panel:  Recent Labs Lab 09/20/16 0941 09/21/16 0355 09/22/16 0621 09/23/16 0254 09/24/16 0420  NA 135 137 139 139 140  K 5.3* 5.2* 5.2* 5.2* 4.2  CL 107 109 110 108 106  CO2 19* 22 23 23 25   GLUCOSE 129* 108* 93 85 96  BUN 129* 119* 107* 106* 103*  CREATININE 4.81* 4.46* 3.96* 3.91* 3.77*  CALCIUM 9.5 9.3 9.6 9.5 9.1   Liver Function Tests: No results for input(s): AST, ALT, ALKPHOS, BILITOT, PROT, ALBUMIN in the last 168 hours. No results for input(s): LIPASE, AMYLASE in the last 168 hours. No results for input(s): AMMONIA in the last 168 hours. CBC:  Recent Labs Lab 09/18/16 0905 09/19/16 0407  WBC 10.0 8.5  HGB 8.9* 8.8*  HCT 26.9* 26.7*  MCV 92.1 93.0  PLT 252 260   Cardiac Enzymes: No results for input(s): CKTOTAL, CKMB, CKMBINDEX, TROPONINI in the last 168 hours. BNP: Invalid input(s): POCBNP CBG: No results for input(s): GLUCAP in the last 168 hours. D-Dimer No results for input(s): DDIMER in the last 72 hours. Hgb A1c No results for input(s): HGBA1C in the last 72 hours. Lipid Profile No results for input(s): CHOL, HDL, LDLCALC, TRIG, CHOLHDL, LDLDIRECT in the last 72 hours. Thyroid function  studies No results for input(s): TSH, T4TOTAL, T3FREE, THYROIDAB in the last 72 hours.  Invalid input(s): FREET3 Anemia work up No results for input(s): VITAMINB12, FOLATE, FERRITIN, TIBC, IRON, RETICCTPCT in the last 72 hours. Urinalysis    Component Value Date/Time   COLORURINE AMBER (A) 09/16/2016 1820   APPEARANCEUR TURBID (A) 09/16/2016 1820   LABSPEC 1.014 09/16/2016 1820   PHURINE 5.0 09/16/2016 1820   GLUCOSEU NEGATIVE 09/16/2016 1820   HGBUR NEGATIVE 09/16/2016 1820   BILIRUBINUR NEGATIVE 09/16/2016 1820   KETONESUR NEGATIVE 09/16/2016 1820   PROTEINUR 100 (A) 09/16/2016 1820  NITRITE NEGATIVE 09/16/2016 1820   LEUKOCYTESUR LARGE (A) 09/16/2016 1820   Sepsis Labs Invalid input(s): PROCALCITONIN,  WBC,  LACTICIDVEN Microbiology Recent Results (from the past 240 hour(s))  Culture, Urine     Status: Abnormal   Collection Time: 09/16/16  6:21 PM  Result Value Ref Range Status   Specimen Description URINE, RANDOM  Final   Special Requests NONE  Final   Culture MULTIPLE SPECIES PRESENT, SUGGEST RECOLLECTION (A)  Final   Report Status 09/17/2016 FINAL  Final  Culture, Urine     Status: Abnormal   Collection Time: 09/19/16 12:21 PM  Result Value Ref Range Status   Specimen Description URINE, CLEAN CATCH  Final   Special Requests NONE  Final   Culture 20,000 COLONIES/mL PSEUDOMONAS AERUGINOSA (A)  Final   Report Status 09/21/2016 FINAL  Final   Organism ID, Bacteria PSEUDOMONAS AERUGINOSA (A)  Final      Susceptibility   Pseudomonas aeruginosa - MIC*    CEFTAZIDIME <=1 SENSITIVE Sensitive     CIPROFLOXACIN <=0.25 SENSITIVE Sensitive     GENTAMICIN <=1 SENSITIVE Sensitive     IMIPENEM 2 SENSITIVE Sensitive     PIP/TAZO <=4 SENSITIVE Sensitive     CEFEPIME <=1 SENSITIVE Sensitive     * 20,000 COLONIES/mL PSEUDOMONAS AERUGINOSA     Time coordinating discharge: Over 30 minutes  SIGNED:   Louellen Molder, MD  [facility] 09/24/2016, 11:53 AM Pager   If  7PM-7AM, please contact night-coverage [email or web address] Password [email or web address]

## 2016-09-24 NOTE — Progress Notes (Signed)
Renal f/u will be with Dr. Justin Mend on July 20th at 0845  We are in a difficult spot with the pt , CKD and BP.  I called and discussed situation with [name], granddaughter and pt via speaker  Phone.  The will discuss with IR. BP remains labile.  I think we have no choice except to proceed with an intervention as appropriate. Jeffrey Campbell C

## 2016-09-24 NOTE — Clinical Social Work Note (Signed)
CSW called patient's wife who confirmed that the plan is still for him to discharge to [facility]. Patient's wife will transport because patient is walking 250 feet and not on oxygen so insurance will likely not cover PTAR.  Dayton Scrape, [place]

## 2016-09-28 ENCOUNTER — Ambulatory Visit
Admit: 2016-09-28 | Discharge: 2016-09-28 | Disposition: A | Discharge status: Home / Self Care | Payer: Medicare Other | Attending: General Surgery | Admitting: General Surgery

## 2016-09-28 DIAGNOSIS — N183 Chronic kidney disease, stage 3 (moderate): Secondary | ICD-10-CM | POA: Diagnosis not present

## 2016-09-28 DIAGNOSIS — I701 Atherosclerosis of renal artery: Secondary | ICD-10-CM | POA: Diagnosis not present

## 2016-09-28 HISTORY — PX: IR RADIOLOGIST EVAL & MGMT: IMG5224

## 2016-09-28 NOTE — Consult Note (Signed)
Chief Complaint: Patient was seen in consultation today for renal artery stenosis at the request of Osborne,Kelly  Referring Physician(s): Dr. Florene Glen  History of Present Illness: Jeffrey Campbell is a 83 y.o. male with a history of stage III chronic kidney disease, diastolic heart dysfunction, pulmonary arterial hypertension, and systemic arterial hypertension.  In October 2016 he had a duplex renal artery ultrasound which demonstrated suspicion for bilateral greater than 60% renal artery stenoses.  Additionally, on prior ultrasound imaging his kidneys have been demonstrated to be echogenic and slightly small in size.  He was recently admitted to the hospital with labile hypertensive emergency and hypertensive encephalopathy. His hypertension was a difficult to control, and during his admission the addition of an ACE inhibitor led to a worsening of his renal function which lends clinical support to the presence of underlying renal artery stenosis. His creatinine peaked at 5.2 on 09/19/2016.    He presents today for discussion of possible CO2 angiography and renal artery stent placement. We do not have any anatomic imaging of his renal arteries. A prior right heart catheterization with intravenous contrast led to a detriment in his renal function. We know his kidneys are highly susceptible to intravenous contrast.  Currently, he feels clinically at his baseline and has no active complaints.  Past Medical History:  Diagnosis Date  . BPH (benign prostatic hyperplasia)   . CAD (coronary artery disease)   . Carotid artery stenosis    1-39% bilateral carotid stenosis by dopplers 2018  . CKD (chronic kidney disease), stage III   . Diastolic dysfunction   . Dilated aortic root ([place])    31mm by echo 02/2015  . Glaucoma   . Graves disease   . Heart murmur, systolic   . History of ETOH abuse   . History of PFTs 05/2010   moderate airflow obstruction w reduced DLCO by PFT   . Hyperlipemia     . Hypertension   . Hyperthyroidism 08/26/10   radioactive iodine therapy   . Mitral regurgitation echo 2015   mild  . Multiple thyroid nodules   . MVP (mitral valve prolapse) 11/2012   posterior MVP  . Organic impotence   . OSA (obstructive sleep apnea)    upper airway resistance syndrome with RDI 18/hr - not on CPAP due to insurance not covering  . PUD (peptic ulcer disease)   . Pulmonary hypertension ([place]) echo 2015   moderate PASP 62mmHg  . Shutdown renal    after cardiac cath  . Type 2 diabetes, diet controlled ([place])   . Upper airway resistance syndrome     Past Surgical History:  Procedure Laterality Date  . APPENDECTOMY    . CARDIAC CATHETERIZATION    . CARDIAC CATHETERIZATION N/A 02/17/2015   Procedure: Right Heart Cath;  Surgeon: Larey Dresser, MD;  Location: [facility];  Service: Cardiovascular;  Laterality: N/A;  . heart catherization    . HERNIA REPAIR  10/2009  . RIGHT HEART CATH N/A 07/28/2016   Procedure: Right Heart Cath;  Surgeon: Larey Dresser, MD;  Location: [facility];  Service: Cardiovascular;  Laterality: N/A;    Allergies: Iodinated diagnostic agents; Zocor [simvastatin]; Tricor [fenofibrate]; Budesonide-formoterol fumarate; and Tiotropium bromide monohydrate  Medications: Prior to Admission medications   Medication Sig Start Date End Date Taking? Authorizing Provider  amLODipine (NORVASC) 10 MG tablet Take 1 tablet (10 mg total) by mouth daily. 09/24/16 09/24/17  Dhungel, Flonnie Overman, MD  aspirin EC 81 MG  tablet Take 81 mg by mouth daily.    [provider]  atorvastatin (LIPITOR) 10 MG tablet Take 1 tablet (10 mg total) by mouth daily. 09/24/16 09/24/17  Dhungel, Nishant, MD  Brinzolamide-Brimonidine (SIMBRINZA) 1-0.2 % SUSP Place 1 drop into both eyes 2 (two) times daily.     [provider]  doxazosin (CARDURA) 8 MG tablet Take 1 tablet (8 mg total) by mouth daily. 09/24/16   Dhungel, Nishant, MD  feeding supplement,  ENSURE ENLIVE, (ENSURE ENLIVE) LIQD Take 237 mLs by mouth 2 (two) times daily between meals. 09/24/16   Dhungel, Flonnie Overman, MD  furosemide (LASIX) 40 MG tablet Take 1 tablet (40 mg total) by mouth daily as needed for fluid or edema. 09/24/16   Dhungel, Nishant, MD  hydrALAZINE (APRESOLINE) 100 MG tablet TAKE 1 TABLET (100 MG TOTAL) BY MOUTH 3 (THREE) TIMES DAILY. Patient taking differently: Take 100 mg by mouth 3 (three) times daily.  08/24/16   Sueanne Margarita, MD  levothyroxine (SYNTHROID, LEVOTHROID) 137 MCG tablet Take 137 mcg by mouth daily before breakfast.  06/28/16   [provider]  Multiple Vitamin (MULTIVITAMIN WITH MINERALS) TABS tablet Take 1 tablet by mouth daily.    [provider]  potassium chloride (K-DUR,KLOR-CON) 10 MEQ tablet Take 1 tablet (10 mEq total) by mouth daily. 09/19/15   Sueanne Margarita, MD  QUEtiapine (SEROQUEL) 25 MG tablet Take 1 tablet (25 mg total) by mouth at bedtime. 09/24/16   Dhungel, Flonnie Overman, MD     Family History  Problem Relation Age of Onset  . Kidney failure Father   . Hypertension Father   . Colon cancer Mother   . Colon cancer Brother   . Lung disease Brother   . Colon cancer Maternal Uncle     Social History   Social History  . Marital status: Married    Spouse name: N/A  . Number of children: 4  . Years of education: N/A   Occupational History  . Retired Retired    [employer]   Social History Main Topics  . Smoking status: Former Smoker    Packs/day: 2.00    Years: 40.00    Types: Cigarettes    Quit date: 03/29/1984  . Smokeless tobacco: Never Used  . Alcohol use No     Comment: quit in 1981  . Drug use: No  . Sexual activity: Not Asked   Other Topics Concern  . None   Social History Narrative  . None     Review of Systems: A 12 point ROS discussed and pertinent positives are indicated in the HPI above.  All other systems are negative.  Review of Systems  Vital Signs: BP (!) 184/61 (BP Location:  Left Arm, Patient Position: Sitting, Cuff Size: Normal)   Pulse (!) 52   Temp 98.5 F (36.9 C)   Resp 14   Ht 5\' 9"  (1.753 m)   Wt 135 lb (61.2 kg)   SpO2 99%   BMI 19.94 kg/m   Physical Exam  Constitutional: He is oriented to person, place, and time. He appears well-developed and well-nourished. No distress.  HENT:  Head: Normocephalic and atraumatic.  Eyes: No scleral icterus.  Cardiovascular: Normal rate and regular rhythm.   Abdominal: Soft. He exhibits no distension. There is no tenderness.  Neurological: He is alert and oriented to person, place, and time.  Skin: Skin is warm and dry.  Psychiatric: He has a normal mood and affect. His behavior is normal.  Nursing note and vitals reviewed.    Imaging: Dg Chest 2 View  Result Date: 09/04/2016 CLINICAL DATA:  Unsteady while standing. Cough and altered mental status. EXAM: CHEST  2 VIEW COMPARISON:  01/28/2015 CXR, chest CT 05/12/2016 FINDINGS: Stable cardiomegaly with aortic atherosclerosis. Mild central vascular congestion. No pneumonic consolidation, effusion or pneumothorax. No dominant mass is seen. No acute nor suspicious osseous abnormality. IMPRESSION: Cardiomegaly with aortic atherosclerosis. Mild vascular congestion since prior exam is noted. Electronically Signed   By: Ashley Royalty M.D.   On: 09/04/2016 22:42   Ct Head Wo Contrast  Result Date: 09/04/2016 CLINICAL DATA:  Altered mental status EXAM: CT HEAD WITHOUT CONTRAST TECHNIQUE: Contiguous axial images were obtained from the base of the skull through the vertex without intravenous contrast. COMPARISON:  None. FINDINGS: BRAIN: There is sulcal and ventricular prominence consistent with superficial and central atrophy. No intraparenchymal hemorrhage, mass effect nor midline shift. Periventricular and subcortical white matter hypodensities consistent with chronic small vessel ischemic disease are identified. No acute large vascular territory infarcts. No abnormal  extra-axial fluid collections. Basal cisterns are not effaced and midline. VASCULAR: Moderate calcific atherosclerosis of the carotid siphons. SKULL: No skull fracture. No significant scalp soft tissue swelling. SINUSES/ORBITS: The mastoid air-cells are clear. The included paranasal sinuses are well-aerated.The included ocular globes and orbital contents are non-suspicious. OTHER: None. IMPRESSION: 1. Chronic moderate degree small vessel ischemia with cerebral atrophy. 2. No acute intracranial abnormality. Electronically Signed   By: Ashley Royalty M.D.   On: 09/04/2016 22:12   Mr Brain Wo Contrast  Result Date: 09/12/2016 CLINICAL DATA:  Altered mental status EXAM: MRI HEAD WITHOUT CONTRAST TECHNIQUE: Multiplanar, multiecho pulse sequences of the brain and surrounding structures were obtained without intravenous contrast. COMPARISON:  CT brain 09/04/2016 FINDINGS: Brain: The midline structures are normal. There is no focal diffusion restriction to indicate acute infarct. There is beginning confluent hyperintense T2-weighted signal within the periventricular white matter, most often seen in the setting of chronic microvascular ischemia. No intraparenchymal hematoma or chronic microhemorrhage. Brain volume is normal for age without age-advanced or lobar predominant atrophy. The dura is normal and there is no extra-axial collection. Vascular: Major intracranial arterial and venous sinus flow voids are preserved. Skull and upper cervical spine: The visualized skull base, calvarium, upper cervical spine and extracranial soft tissues are normal. Sinuses/Orbits: No fluid levels or advanced mucosal thickening. No mastoid effusion. 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 IMPRESSION: 1. No acute intracranial abnormality. 2. Advanced white matter disease, most commonly chronic microvascular ischemia. 3. Bilateral exophthalmos, in keeping with thyroid ophthalmopathy. Electronically Signed    By: Ulyses Jarred M.D.   On: 09/12/2016 02:47   US Renal  Result Date: 09/17/2016 EXAM: RENAL / URINARY TRACT ULTRASOUND COMPLETE COMPARISON:  Ultrasound 05/05/2016 FINDINGS: Right Kidney: Length: 3.6 cm. Diffuse increased echogenicity consistent with medical renal disease. Relatively normal cortical thickness and no hydronephrosis. Left Kidney: Length: 9.4 cm. Increased echogenicity suggesting medical renal disease. Normal renal cortical thickness and no hydronephrosis. Small renal cysts are stable. Bladder: Mild diffuse bladder wall thickening and some bladder debris. No mass. Other: Bilateral pleural effusions are noted. IMPRESSION: Increased echogenicity of both kidneys consistent with medical renal disease. No findings for hydronephrosis. Mild diffuse bladder wall thickening. Electronically Signed   By: Marijo Sanes M.D.   On: 09/17/2016 16:41   Dg Chest Port 1 View  Result Date: 09/20/2016 CLINICAL DATA:  83 year old with chronic kidney disease. Possible volume overload. EXAM: PORTABLE CHEST  1 VIEW COMPARISON:  09/06/2016, 09/04/2016 and earlier, including CT chest 05/12/2016 and earlier. FINDINGS: Cardiac silhouette moderately to markedly enlarged, unchanged. Thoracic aorta atherosclerotic, unchanged. Hilar and mediastinal contours otherwise unremarkable. Pulmonary vascularity normal without evidence of pulmonary edema. Atelectasis involving the left lung base. Lungs otherwise clear. IMPRESSION: Atelectasis involving the left lung base. No acute cardiopulmonary disease otherwise. Stable cardiomegaly without pulmonary edema. Electronically Signed   By: Evangeline Dakin M.D.   On: 09/20/2016 11:27   Dg Chest Port 1 View  Result Date: 09/06/2016 CLINICAL DATA:  Respiratory failure EXAM: PORTABLE CHEST 1 VIEW COMPARISON:  09/04/2016 FINDINGS: Improvement in vascular congestion. Cardiac enlargement. Negative for edema or effusion. Mild left lower lobe airspace disease has developed since the prior  study most likely atelectasis. IMPRESSION: Improving vascular congestion. Mild left lower lobe atelectasis. Electronically Signed   By: Franchot Gallo M.D.   On: 09/06/2016 07:00    Labs:  CBC:  Recent Labs  09/07/16 0448 09/09/16 0950 09/18/16 0905 09/19/16 0407  WBC 6.7 7.7 10.0 8.5  HGB 11.0* 10.7* 8.9* 8.8*  HCT 34.3* 32.8* 26.9* 26.7*  PLT 148* 142* 252 260    COAGS:  Recent Labs  07/21/16 1037  INR 1.1    BMP:  Recent Labs  09/21/16 0355 09/22/16 0621 09/23/16 0254 09/24/16 0420  NA 137 139 139 140  K 5.2* 5.2* 5.2* 4.2  CL 109 110 108 106  CO2 22 23 23 25   GLUCOSE 108* 93 85 96  BUN 119* 107* 106* 103*  CALCIUM 9.3 9.6 9.5 9.1  CREATININE 4.46* 3.96* 3.91* 3.77*  GFRNONAA 11* 13* 13* 14*  GFRAA 13* 15* 15* 16*    LIVER FUNCTION TESTS:  Recent Labs  01/20/16 0909 07/06/16 0854  BILITOT 1.2 0.9  AST 18 17  ALT 13 14  ALKPHOS 57 70  PROT 6.5 6.8  ALBUMIN 4.0 4.2    TUMOR MARKERS: No results for input(s): AFPTM, CEA, CA199, CHROMGRNA in the last 8760 hours.  Assessment and Plan:  83 year old gentleman with a suggestion of bilateral renal artery stenoses seen on prior renal Doppler ultrasound from 2016 and a recent hospital admission for labile hypertensive emergency and hypertensive encephalopathy. He is currently discharged his blood pressure has been more stable. Unfortunately, he also has a history of significant chronic kidney disease and is recovering from exacerbating acute kidney injury.  While renal arteriography and bilateral renal artery stenting may help stabilize his hypertension, there is the possibility for risk to his renal function given its tenuous nature.  Some studies have shown a decrease in renal function secondary to cholesterol embolization following stent placement. Additionally, there are the small risks of renal artery dissection or rupture.  In a patient with relatively normal renal function, these risks are fairly  minimal. However, with his renal function even a slight insult could result in renal function detriment sufficient to require dialysis which would be a significant change to his quality of life.  At this point, I think the best option is to proceed with a noncontrast renal MRA to get an idea of his anatomy, number of renal arteries and to serve as a second imaging confirmation of underlying renal artery stenosis. Additionally, I would like to recheck his labs in approximately a week as his renal function was trending back down when he was discharged on 6/29.  Armed with this anatomic information, his current renal function, and also a week or two trend of how stable his blood pressure has  been with the recent changes to his medications, we should be able to make the most informed decision as to whether to proceed with CO2 renal angiography and stenting.  1.) Schedule for renal protocol MRA to evaluate for renal artery stenosis as soon as possible. 2.) Follow-up clinic visit following the renal protocol MRA with a basic metabolic panel the day of, or the day before.   Thank you for this interesting consult.  I greatly enjoyed meeting Jeffrey Campbell and look forward to participating in their care.  A copy of this report was sent to the requesting provider on this date.  Electronically Signed: Jacqulynn Cadet 09/28/2016, 4:46 PM   I spent a total of  40 Minutes  in face to face in clinical consultation, greater than 50% of which was counseling/coordinating care for renal artery stenosis.

## 2016-09-30 ENCOUNTER — Other Ambulatory Visit (HOSPITAL_COMMUNITY): Payer: Self-pay | Admitting: Interventional Radiology

## 2016-09-30 DIAGNOSIS — I701 Atherosclerosis of renal artery: Secondary | ICD-10-CM

## 2016-10-01 ENCOUNTER — Other Ambulatory Visit: Payer: Self-pay | Admitting: *Deleted

## 2016-10-01 DIAGNOSIS — I701 Atherosclerosis of renal artery: Secondary | ICD-10-CM

## 2016-10-11 DIAGNOSIS — J449 Chronic obstructive pulmonary disease, unspecified: Secondary | ICD-10-CM | POA: Diagnosis not present

## 2016-10-11 DIAGNOSIS — I509 Heart failure, unspecified: Secondary | ICD-10-CM | POA: Diagnosis not present

## 2016-10-11 DIAGNOSIS — I674 Hypertensive encephalopathy: Secondary | ICD-10-CM | POA: Diagnosis not present

## 2016-10-11 DIAGNOSIS — F0151 Vascular dementia with behavioral disturbance: Secondary | ICD-10-CM | POA: Diagnosis not present

## 2016-10-13 ENCOUNTER — Other Ambulatory Visit (HOSPITAL_COMMUNITY): Payer: Self-pay | Admitting: Cardiology

## 2016-10-15 ENCOUNTER — Encounter (HOSPITAL_COMMUNITY): Payer: Self-pay | Admitting: Emergency Medicine

## 2016-10-15 ENCOUNTER — Inpatient Hospital Stay (HOSPITAL_COMMUNITY)
Admission: EM | Admit: 2016-10-15 | Discharge: 2016-10-17 | DRG: 812 | Disposition: A | Discharge status: Home Health | Payer: Medicare Other | Attending: Nephrology | Admitting: Nephrology

## 2016-10-15 DIAGNOSIS — Z87891 Personal history of nicotine dependence: Secondary | ICD-10-CM

## 2016-10-15 DIAGNOSIS — Z8711 Personal history of peptic ulcer disease: Secondary | ICD-10-CM | POA: Diagnosis not present

## 2016-10-15 DIAGNOSIS — K319 Disease of stomach and duodenum, unspecified: Secondary | ICD-10-CM | POA: Diagnosis present

## 2016-10-15 DIAGNOSIS — I160 Hypertensive urgency: Secondary | ICD-10-CM | POA: Insufficient documentation

## 2016-10-15 DIAGNOSIS — K922 Gastrointestinal hemorrhage, unspecified: Secondary | ICD-10-CM | POA: Diagnosis not present

## 2016-10-15 DIAGNOSIS — I341 Nonrheumatic mitral (valve) prolapse: Secondary | ICD-10-CM | POA: Diagnosis present

## 2016-10-15 DIAGNOSIS — J449 Chronic obstructive pulmonary disease, unspecified: Secondary | ICD-10-CM | POA: Diagnosis present

## 2016-10-15 DIAGNOSIS — I701 Atherosclerosis of renal artery: Secondary | ICD-10-CM | POA: Diagnosis present

## 2016-10-15 DIAGNOSIS — Z8249 Family history of ischemic heart disease and other diseases of the circulatory system: Secondary | ICD-10-CM | POA: Diagnosis not present

## 2016-10-15 DIAGNOSIS — Z800 Family history of malignant neoplasm of digestive organs: Secondary | ICD-10-CM

## 2016-10-15 DIAGNOSIS — K259 Gastric ulcer, unspecified as acute or chronic, without hemorrhage or perforation: Secondary | ICD-10-CM | POA: Diagnosis not present

## 2016-10-15 DIAGNOSIS — E785 Hyperlipidemia, unspecified: Secondary | ICD-10-CM | POA: Diagnosis not present

## 2016-10-15 DIAGNOSIS — N400 Enlarged prostate without lower urinary tract symptoms: Secondary | ICD-10-CM | POA: Diagnosis present

## 2016-10-15 DIAGNOSIS — Z841 Family history of disorders of kidney and ureter: Secondary | ICD-10-CM

## 2016-10-15 DIAGNOSIS — D509 Iron deficiency anemia, unspecified: Secondary | ICD-10-CM | POA: Diagnosis not present

## 2016-10-15 DIAGNOSIS — D638 Anemia in other chronic diseases classified elsewhere: Secondary | ICD-10-CM | POA: Insufficient documentation

## 2016-10-15 DIAGNOSIS — Z79899 Other long term (current) drug therapy: Secondary | ICD-10-CM

## 2016-10-15 DIAGNOSIS — I739 Peripheral vascular disease, unspecified: Secondary | ICD-10-CM | POA: Diagnosis present

## 2016-10-15 DIAGNOSIS — H409 Unspecified glaucoma: Secondary | ICD-10-CM | POA: Diagnosis present

## 2016-10-15 DIAGNOSIS — I129 Hypertensive chronic kidney disease with stage 1 through stage 4 chronic kidney disease, or unspecified chronic kidney disease: Secondary | ICD-10-CM | POA: Diagnosis present

## 2016-10-15 DIAGNOSIS — R195 Other fecal abnormalities: Secondary | ICD-10-CM | POA: Diagnosis not present

## 2016-10-15 DIAGNOSIS — Z79890 Hormone replacement therapy (postmenopausal): Secondary | ICD-10-CM

## 2016-10-15 DIAGNOSIS — I2510 Atherosclerotic heart disease of native coronary artery without angina pectoris: Secondary | ICD-10-CM | POA: Diagnosis present

## 2016-10-15 DIAGNOSIS — E039 Hypothyroidism, unspecified: Secondary | ICD-10-CM | POA: Diagnosis not present

## 2016-10-15 DIAGNOSIS — N2581 Secondary hyperparathyroidism of renal origin: Secondary | ICD-10-CM | POA: Diagnosis not present

## 2016-10-15 DIAGNOSIS — R9431 Abnormal electrocardiogram [ECG] [EKG]: Secondary | ICD-10-CM | POA: Diagnosis not present

## 2016-10-15 DIAGNOSIS — D649 Anemia, unspecified: Secondary | ICD-10-CM | POA: Insufficient documentation

## 2016-10-15 DIAGNOSIS — Z7982 Long term (current) use of aspirin: Secondary | ICD-10-CM

## 2016-10-15 DIAGNOSIS — K3189 Other diseases of stomach and duodenum: Secondary | ICD-10-CM | POA: Diagnosis not present

## 2016-10-15 DIAGNOSIS — R001 Bradycardia, unspecified: Secondary | ICD-10-CM | POA: Diagnosis not present

## 2016-10-15 DIAGNOSIS — I272 Pulmonary hypertension, unspecified: Secondary | ICD-10-CM | POA: Diagnosis present

## 2016-10-15 DIAGNOSIS — E1122 Type 2 diabetes mellitus with diabetic chronic kidney disease: Secondary | ICD-10-CM | POA: Diagnosis not present

## 2016-10-15 DIAGNOSIS — N184 Chronic kidney disease, stage 4 (severe): Secondary | ICD-10-CM | POA: Diagnosis not present

## 2016-10-15 DIAGNOSIS — R011 Cardiac murmur, unspecified: Secondary | ICD-10-CM | POA: Diagnosis present

## 2016-10-15 DIAGNOSIS — K921 Melena: Secondary | ICD-10-CM | POA: Diagnosis not present

## 2016-10-15 DIAGNOSIS — N185 Chronic kidney disease, stage 5: Secondary | ICD-10-CM | POA: Insufficient documentation

## 2016-10-15 DIAGNOSIS — D631 Anemia in chronic kidney disease: Secondary | ICD-10-CM | POA: Diagnosis not present

## 2016-10-15 DIAGNOSIS — N183 Chronic kidney disease, stage 3 (moderate): Secondary | ICD-10-CM | POA: Diagnosis not present

## 2016-10-15 DIAGNOSIS — I6523 Occlusion and stenosis of bilateral carotid arteries: Secondary | ICD-10-CM | POA: Diagnosis present

## 2016-10-15 DIAGNOSIS — G4733 Obstructive sleep apnea (adult) (pediatric): Secondary | ICD-10-CM | POA: Diagnosis not present

## 2016-10-15 DIAGNOSIS — I77810 Thoracic aortic ectasia: Secondary | ICD-10-CM | POA: Diagnosis present

## 2016-10-15 DIAGNOSIS — Z888 Allergy status to other drugs, medicaments and biological substances status: Secondary | ICD-10-CM

## 2016-10-15 DIAGNOSIS — Z91041 Radiographic dye allergy status: Secondary | ICD-10-CM

## 2016-10-15 LAB — CBC
HEMATOCRIT: 19.6 % — AB (ref 39.0–52.0)
Hemoglobin: 6.1 g/dL — CL (ref 13.0–17.0)
MCH: 30 pg (ref 26.0–34.0)
MCHC: 31.1 g/dL (ref 30.0–36.0)
MCV: 96.6 fL (ref 78.0–100.0)
Platelets: 222 10*3/uL (ref 150–400)
RBC: 2.03 MIL/uL — ABNORMAL LOW (ref 4.22–5.81)
RDW: 18.1 % — AB (ref 11.5–15.5)
WBC: 5.7 10*3/uL (ref 4.0–10.5)

## 2016-10-15 LAB — COMPREHENSIVE METABOLIC PANEL
ALBUMIN: 3.6 g/dL (ref 3.5–5.0)
ALT: 21 U/L (ref 17–63)
AST: 28 U/L (ref 15–41)
Alkaline Phosphatase: 68 U/L (ref 38–126)
Anion gap: 8 (ref 5–15)
BILIRUBIN TOTAL: 1 mg/dL (ref 0.3–1.2)
BUN: 61 mg/dL — AB (ref 6–20)
CO2: 21 mmol/L — ABNORMAL LOW (ref 22–32)
Calcium: 9.1 mg/dL (ref 8.9–10.3)
Chloride: 112 mmol/L — ABNORMAL HIGH (ref 101–111)
Creatinine, Ser: 3.02 mg/dL — ABNORMAL HIGH (ref 0.61–1.24)
GFR calc Af Amer: 21 mL/min — ABNORMAL LOW (ref >60)
GFR calc non Af Amer: 18 mL/min — ABNORMAL LOW (ref >60)
GLUCOSE: 142 mg/dL — AB (ref 65–99)
POTASSIUM: 4.4 mmol/L (ref 3.5–5.1)
Sodium: 141 mmol/L (ref 135–145)
TOTAL PROTEIN: 6.9 g/dL (ref 6.5–8.1)

## 2016-10-15 LAB — PROTIME-INR
INR: 1
Prothrombin Time: 13.2 seconds (ref 11.4–15.2)

## 2016-10-15 LAB — ABO/RH: ABO/RH(D): O POS

## 2016-10-15 LAB — POC OCCULT BLOOD, ED: FECAL OCCULT BLD: POSITIVE — AB

## 2016-10-15 LAB — PREPARE RBC (CROSSMATCH)

## 2016-10-15 MED ORDER — AMLODIPINE BESYLATE 10 MG PO TABS
10.0000 mg | ORAL_TABLET | Freq: Every day | ORAL | Status: DC
  Administered 2016-10-15 – 2016-10-17 (×3): 10 mg via ORAL
  Filled 2016-10-15 (×2): qty 1
  Filled 2016-10-15: qty 2

## 2016-10-15 MED ORDER — SODIUM CHLORIDE 0.9 % IV SOLN
8.0000 mg/h | INTRAVENOUS | Status: DC
  Administered 2016-10-15 – 2016-10-16 (×2): 8 mg/h via INTRAVENOUS
  Filled 2016-10-15 (×4): qty 80

## 2016-10-15 MED ORDER — HYDRALAZINE HCL 20 MG/ML IJ SOLN
10.0000 mg | Freq: Four times a day (QID) | INTRAMUSCULAR | Status: DC | PRN
  Administered 2016-10-15 – 2016-10-17 (×3): 10 mg via INTRAVENOUS
  Filled 2016-10-15 (×3): qty 1

## 2016-10-15 MED ORDER — POLYETHYLENE GLYCOL 3350 17 G PO PACK: 17.0000 g | PACK | Freq: Every day | ORAL | Status: DC | PRN

## 2016-10-15 MED ORDER — DOXAZOSIN MESYLATE 8 MG PO TABS
8.0000 mg | ORAL_TABLET | Freq: Every day | ORAL | Status: DC
  Administered 2016-10-16 (×2): 8 mg via ORAL
  Filled 2016-10-15 (×3): qty 1

## 2016-10-15 MED ORDER — SODIUM CHLORIDE 0.9 % IV BOLUS (SEPSIS)
1000.0000 mL | Freq: Once | INTRAVENOUS | Status: AC
  Administered 2016-10-15: 1000 mL via INTRAVENOUS

## 2016-10-15 MED ORDER — SODIUM CHLORIDE 0.9 % IV SOLN
80.0000 mg | Freq: Once | INTRAVENOUS | Status: AC
  Administered 2016-10-15: 20:00:00 80 mg via INTRAVENOUS
  Filled 2016-10-15: qty 80

## 2016-10-15 MED ORDER — QUETIAPINE FUMARATE 25 MG PO TABS
25.0000 mg | ORAL_TABLET | Freq: Every day | ORAL | Status: DC
  Administered 2016-10-16 (×2): 25 mg via ORAL
  Filled 2016-10-15 (×2): qty 1

## 2016-10-15 MED ORDER — ONDANSETRON HCL 4 MG/2ML IJ SOLN: 4.0000 mg | Freq: Four times a day (QID) | INTRAMUSCULAR | Status: DC | PRN

## 2016-10-15 MED ORDER — HYDRALAZINE HCL 50 MG PO TABS
100.0000 mg | ORAL_TABLET | Freq: Three times a day (TID) | ORAL | Status: DC
  Administered 2016-10-15 – 2016-10-17 (×7): 100 mg via ORAL
  Filled 2016-10-15 (×7): qty 2

## 2016-10-15 MED ORDER — ADULT MULTIVITAMIN W/MINERALS CH
1.0000 | ORAL_TABLET | Freq: Every day | ORAL | Status: DC
  Administered 2016-10-16 – 2016-10-17 (×2): 1 via ORAL
  Filled 2016-10-15 (×2): qty 1

## 2016-10-15 MED ORDER — ACETAMINOPHEN 650 MG RE SUPP: 650.0000 mg | Freq: Four times a day (QID) | RECTAL | Status: DC | PRN

## 2016-10-15 MED ORDER — ATORVASTATIN CALCIUM 10 MG PO TABS
10.0000 mg | ORAL_TABLET | Freq: Every day | ORAL | Status: DC
  Administered 2016-10-16 – 2016-10-17 (×2): 10 mg via ORAL
  Filled 2016-10-15 (×2): qty 1

## 2016-10-15 MED ORDER — SODIUM CHLORIDE 0.9 % IV SOLN
Freq: Once | INTRAVENOUS | Status: AC
  Administered 2016-10-15: 17:00:00 via INTRAVENOUS

## 2016-10-15 MED ORDER — HYDRALAZINE HCL 100 MG PO TABS: 100.0000 mg | ORAL_TABLET | Freq: Three times a day (TID) | ORAL | Status: DC

## 2016-10-15 MED ORDER — PANTOPRAZOLE SODIUM 40 MG IV SOLR: 40.0000 mg | Freq: Two times a day (BID) | INTRAVENOUS | Status: DC

## 2016-10-15 MED ORDER — ACETAMINOPHEN 325 MG PO TABS: 650.0000 mg | ORAL_TABLET | Freq: Four times a day (QID) | ORAL | Status: DC | PRN

## 2016-10-15 MED ORDER — ONDANSETRON HCL 4 MG PO TABS: 4.0000 mg | ORAL_TABLET | Freq: Four times a day (QID) | ORAL | Status: DC | PRN

## 2016-10-15 MED ORDER — LEVOTHYROXINE SODIUM 25 MCG PO TABS
137.0000 ug | ORAL_TABLET | Freq: Every day | ORAL | Status: DC
  Administered 2016-10-16 – 2016-10-17 (×2): 137 ug via ORAL
  Filled 2016-10-15 (×3): qty 1

## 2016-10-15 MED ORDER — ENSURE ENLIVE PO LIQD
237.0000 mL | Freq: Two times a day (BID) | ORAL | Status: DC
  Administered 2016-10-16 – 2016-10-17 (×2): 237 mL via ORAL
  Filled 2016-10-15: qty 237

## 2016-10-15 MED ORDER — POTASSIUM CHLORIDE CRYS ER 10 MEQ PO TBCR
10.0000 meq | EXTENDED_RELEASE_TABLET | Freq: Every day | ORAL | Status: DC
  Administered 2016-10-16 – 2016-10-17 (×2): 10 meq via ORAL
  Filled 2016-10-15 (×2): qty 1

## 2016-10-15 NOTE — ED Triage Notes (Signed)
Pt sent here from dr's office for anemia-- pt was recently discharged from [facility] and [facility]. States "I have been having dark stools for 1 week" pt is very pale, weak.

## 2016-10-15 NOTE — ED Notes (Signed)
Critical Lab: 6.1 hemoglobin Dr. Thomasene Lot made aware

## 2016-10-15 NOTE — ED Provider Notes (Signed)
[facility] DEPT Provider Note   CSN: [id number] Arrival date & time: 10/15/16  1403     History   Chief Complaint Chief Complaint  Patient presents with  . Anemia  . sent by dr    HPI Jeffrey Campbell is a 83 y.o. male with remote history of stomach ulcers who presents with symptomatic anemia. Patient was evaluated by primary care physician this morning who noted a low hemoglobin, and was sent here for further evaluation. Patient reports one week of dark stools and fatigue over the last couple of days as well as lightheadedness. Not on any blood thinners. No nausea, vomiting, chest pain, shortness of breath, abdominal pain.  The history is provided by the patient.    Past Medical History:  Diagnosis Date  . BPH (benign prostatic hyperplasia)   . CAD (coronary artery disease)   . Carotid artery stenosis    1-39% bilateral carotid stenosis by dopplers 2018  . CKD (chronic kidney disease), stage III   . Diastolic dysfunction   . Dilated aortic root ([place])    33mm by echo 02/2015  . Glaucoma   . Graves disease   . Heart murmur, systolic   . History of ETOH abuse   . History of PFTs 05/2010   moderate airflow obstruction w reduced DLCO by PFT   . Hyperlipemia   . Hypertension   . Hyperthyroidism 08/26/10   radioactive iodine therapy   . Mitral regurgitation echo 2015   mild  . Multiple thyroid nodules   . MVP (mitral valve prolapse) 11/2012   posterior MVP  . Organic impotence   . OSA (obstructive sleep apnea)    upper airway resistance syndrome with RDI 18/hr - not on CPAP due to insurance not covering  . PUD (peptic ulcer disease)   . Pulmonary hypertension ([place]) echo 2015   moderate PASP 42mmHg  . Shutdown renal    after cardiac cath  . Type 2 diabetes, diet controlled ([place])   . Upper airway resistance syndrome     Patient Active Problem List   Diagnosis Date Noted  . GI bleeding 10/15/2016  . GI bleed 10/15/2016  . Anemia   . CKD (chronic kidney disease),  stage IV ([place])   . Hypertensive urgency   . Vascular dementia with behavioral disturbance 09/24/2016  . Acute renal failure superimposed on stage 3 chronic kidney disease ([place]) 09/16/2016  . Hypertensive encephalopathy   . Hypertensive emergency 09/05/2016  . Branch retinal vein occlusion of right eye 05/07/2016  . Graves' orbitopathy 05/07/2016  . Primary open angle glaucoma of left eye, mild stage 05/07/2016  . Primary open angle glaucoma of right eye, severe stage 05/07/2016  . PAD (peripheral artery disease) ([place]) 04/20/2016  . Dilated aortic root ([place]) 01/13/2016  . Bilateral renal artery stenosis ([place]) 02/18/2015  . Pulmonary hypertension ([place]) 02/10/2015  . Chronic diastolic CHF (congestive heart failure) ([place]) 02/10/2015  . Renal bruit 01/07/2015  . Carotid artery stenosis   . Diastolic dysfunction   . Bradycardia 05/29/2013  . Mitral regurgitation 11/27/2012  . MVP (mitral valve prolapse) 11/27/2012  . Glaucoma 04/09/2011  . COPD (chronic obstructive pulmonary disease) ([place]) 09/21/2010  . OSA (obstructive sleep apnea)   . Upper airway resistance syndrome   . Hyperthyroidism   . Type 2 diabetes, diet controlled ([place])   . CKD (chronic kidney disease), stage III   . CAD (coronary artery disease)   . Hyperlipemia   . Hypertensive heart and chronic kidney disease with  heart failure and stage 1 through stage 4 chronic kidney disease, or chronic kidney disease ([place])   . PUD (peptic ulcer disease)     Past Surgical History:  Procedure Laterality Date  . APPENDECTOMY    . CARDIAC CATHETERIZATION    . CARDIAC CATHETERIZATION N/A 02/17/2015   Procedure: Right Heart Cath;  Surgeon: Larey Dresser, MD;  Location: [facility];  Service: Cardiovascular;  Laterality: N/A;  . heart catherization    . HERNIA REPAIR  10/2009  . RIGHT HEART CATH N/A 07/28/2016   Procedure: Right Heart Cath;  Surgeon: Larey Dresser, MD;  Location: [facility];  Service: Cardiovascular;   Laterality: N/A;       Home Medications    Prior to Admission medications   Medication Sig Start Date End Date Taking? Authorizing Provider  amLODipine (NORVASC) 10 MG tablet Take 1 tablet (10 mg total) by mouth daily. 09/24/16 09/24/17 Yes Dhungel, Nishant, MD  aspirin EC 81 MG tablet Take 81 mg by mouth daily.   Yes [provider]  atorvastatin (LIPITOR) 10 MG tablet Take 1 tablet (10 mg total) by mouth daily. 09/24/16 09/24/17 Yes Dhungel, Nishant, MD  Brinzolamide-Brimonidine (SIMBRINZA) 1-0.2 % SUSP Place 1 drop into both eyes 2 (two) times daily.    Yes [provider]  doxazosin (CARDURA) 8 MG tablet Take 1 tablet (8 mg total) by mouth daily. 09/24/16  Yes Dhungel, Nishant, MD  feeding supplement, ENSURE ENLIVE, (ENSURE ENLIVE) LIQD Take 237 mLs by mouth 2 (two) times daily between meals. 09/24/16  Yes Dhungel, Nishant, MD  furosemide (LASIX) 40 MG tablet Take 1 tablet (40 mg total) by mouth daily as needed for fluid or edema. 09/24/16  Yes Dhungel, Nishant, MD  hydrALAZINE (APRESOLINE) 100 MG tablet TAKE 1 TABLET (100 MG TOTAL) BY MOUTH 3 (THREE) TIMES DAILY. Patient taking differently: Take 100 mg by mouth 3 (three) times daily.  08/24/16  Yes Turner, Eber Hong, MD  levothyroxine (SYNTHROID, LEVOTHROID) 137 MCG tablet Take 137 mcg by mouth daily before breakfast.  06/28/16  Yes [provider]  Multiple Vitamin (MULTIVITAMIN WITH MINERALS) TABS tablet Take 1 tablet by mouth daily.   Yes [provider]  potassium chloride (K-DUR,KLOR-CON) 10 MEQ tablet Take 1 tablet (10 mEq total) by mouth daily. 09/19/15  Yes Turner, Eber Hong, MD  QUEtiapine (SEROQUEL) 25 MG tablet Take 1 tablet (25 mg total) by mouth at bedtime. 09/24/16  Yes Dhungel, Nishant, MD  hydrALAZINE (APRESOLINE) 100 MG tablet Take 1 tablet (100 mg total) by mouth 3 (three) times daily. MUST COMPLETE APT WITH CHF CLINIC FOR FURTHER REFILLS. Patient not taking: Reported on 10/15/2016 10/14/16   Larey Dresser, MD    Family History Family History  Problem Relation Age of Onset  . Kidney failure Father   . Hypertension Father   . Colon cancer Mother   . Colon cancer Brother   . Lung disease Brother   . Colon cancer Maternal Uncle     Social History Social History  Substance Use Topics  . Smoking status: Former Smoker    Packs/day: 2.00    Years: 40.00    Types: Cigarettes    Quit date: 03/29/1984  . Smokeless tobacco: Never Used  . Alcohol use No     Comment: quit in 1981     Allergies   Iodinated diagnostic agents; Zocor [simvastatin]; Tricor [fenofibrate]; Budesonide-formoterol fumarate; and Tiotropium bromide monohydrate   Review of Systems Review of  Systems  Constitutional: Positive for fatigue. Negative for chills and fever.  HENT: Negative for ear pain and sore throat.   Eyes: Negative for pain and visual disturbance.  Respiratory: Negative for cough and shortness of breath.   Cardiovascular: Negative for chest pain and palpitations.  Gastrointestinal: Positive for blood in stool. Negative for abdominal pain, constipation, diarrhea, nausea and vomiting.  Endocrine: Negative for polyuria.  Genitourinary: Negative for dysuria and hematuria.  Musculoskeletal: Negative for arthralgias and back pain.  Skin: Negative for color change and rash.  Allergic/Immunologic: Negative for immunocompromised state.  Neurological: Positive for weakness and light-headedness. Negative for seizures, syncope and headaches.  Psychiatric/Behavioral: Negative for agitation and behavioral problems.  All other systems reviewed and are negative.    Physical Exam Updated Vital Signs BP 112/89 (BP Location: Right Arm)   Pulse (!) 51   Temp 98.4 F (36.9 C) (Oral)   Resp 15   Ht 5\' 9"  (1.753 m)   Wt 65.4 kg (144 lb 2.9 oz)   SpO2 96%   BMI 21.29 kg/m   Physical Exam  Constitutional: He is oriented to person, place, and time. He appears well-developed and well-nourished. No  distress.  Pale  HENT:  Head: Normocephalic and atraumatic.  Mouth/Throat: Oropharynx is clear and moist.  Eyes: Conjunctivae are normal.  Neck: Normal range of motion. Neck supple. No tracheal deviation present.  Cardiovascular: Normal rate and intact distal pulses.   No murmur heard. Pulmonary/Chest: Effort normal and breath sounds normal. No respiratory distress. He has no wheezes.  Abdominal: Soft. He exhibits no distension. There is no tenderness. There is no rebound and no guarding.  Genitourinary: Rectal exam shows guaiac positive stool.  Musculoskeletal: He exhibits no tenderness or deformity.  Neurological: He is alert and oriented to person, place, and time. No cranial nerve deficit.  Skin: Skin is warm and dry. He is not diaphoretic.  Psychiatric: He has a normal mood and affect.  Nursing note and vitals reviewed.    ED Treatments / Results  Labs (all labs ordered are listed, but only abnormal results are displayed) Labs Reviewed  COMPREHENSIVE METABOLIC PANEL - Abnormal; Notable for the following:       Result Value   Chloride 112 (*)    CO2 21 (*)    Glucose, Bld 142 (*)    BUN 61 (*)    Creatinine, Ser 3.02 (*)    GFR calc non Af Amer 18 (*)    GFR calc Af Amer 21 (*)    All other components within normal limits  CBC - Abnormal; Notable for the following:    RBC 2.03 (*)    Hemoglobin 6.1 (*)    HCT 19.6 (*)    RDW 18.1 (*)    All other components within normal limits  POC OCCULT BLOOD, ED - Abnormal; Notable for the following:    Fecal Occult Bld POSITIVE (*)    All other components within normal limits  PROTIME-INR  BASIC METABOLIC PANEL  CBC  FERRITIN  IRON AND TIBC  TYPE AND SCREEN  PREPARE RBC (CROSSMATCH)  ABO/RH    EKG  EKG Interpretation  Date/Time:  Friday October 15 2016 19:21:52 EDT Ventricular Rate:  48 PR Interval:    QRS Duration: 97 QT Interval:  472 QTC Calculation: 422 R Axis:   58 Text Interpretation:  Sinus bradycardia Left  ventricular hypertrophy Confirmed by Elnora Morrison [phone]) on 10/15/2016 7:26:50 PM       Radiology No results found.  Procedures Procedures (including critical care time)  Medications Ordered in ED Medications  amLODipine (NORVASC) tablet 10 mg (10 mg Oral Given 10/15/16 2005)  atorvastatin (LIPITOR) tablet 10 mg (10 mg Oral Not Given 10/15/16 2200)  doxazosin (CARDURA) tablet 8 mg (8 mg Oral Given 10/16/16 0001)  feeding supplement (ENSURE ENLIVE) (ENSURE ENLIVE) liquid 237 mL (not administered)  multivitamin with minerals tablet 1 tablet (not administered)  QUEtiapine (SEROQUEL) tablet 25 mg (25 mg Oral Given 10/16/16 0000)  levothyroxine (SYNTHROID, LEVOTHROID) tablet 137 mcg (not administered)  potassium chloride (K-DUR,KLOR-CON) CR tablet 10 mEq (not administered)  acetaminophen (TYLENOL) tablet 650 mg (not administered)    Or  acetaminophen (TYLENOL) suppository 650 mg (not administered)  polyethylene glycol (MIRALAX / GLYCOLAX) packet 17 g (not administered)  ondansetron (ZOFRAN) tablet 4 mg (not administered)    Or  ondansetron (ZOFRAN) injection 4 mg (not administered)  hydrALAZINE (APRESOLINE) injection 10 mg (10 mg Intravenous Given 10/16/16 0038)  hydrALAZINE (APRESOLINE) tablet 100 mg (100 mg Oral Given 10/16/16 0000)  pantoprazole (PROTONIX) 80 mg in sodium chloride 0.9 % 250 mL (0.32 mg/mL) infusion (8 mg/hr Intravenous New Bag/Given 10/15/16 2041)  pantoprazole (PROTONIX) injection 40 mg (not administered)  sodium chloride 0.9 % bolus 1,000 mL (0 mLs Intravenous Stopped 10/15/16 1707)  0.9 %  sodium chloride infusion ( Intravenous New Bag/Given 10/15/16 1712)  pantoprazole (PROTONIX) 80 mg in sodium chloride 0.9 % 100 mL IVPB (0 mg Intravenous Stopped 10/15/16 2010)     Initial Impression / Assessment and Plan / ED Course  I have reviewed the triage vital signs and the nursing notes.  Pertinent labs & imaging results that were available during my care of the patient  were reviewed by me and considered in my medical decision making (see chart for details).     Patient presenting with 1 week of dark stools, low hemoglobin noted at PCP. Hemoglobin 6.1 here. Guiac positive. No active bleeding here in the emergency department. History of coronary artery disease and on baby aspirin but no other blood thinners. Started on Protonix drip, clear liquid diet. Considering low hemoglobin, patient will be transfused 2 units.   Patient will be admitted to the hospitalist service for further evaluation and management. GI was also consulted and will see the patient as an inpatient. Patient was stable while under my care in the emergency department and transferred in stable condition.  Patient was seen with my attending, Dr. Reather Converse, who voiced agreement and oversaw the evaluation and treatment of this patient.   [employer] seismologist was used in the creation of this note. If there are any errors or inconsistencies needing clarification, please contact me directly.     Final Clinical Impressions(s) / ED Diagnoses   Final diagnoses:  Symptomatic anemia    New Prescriptions Current Discharge Medication List       Valda Lamb, MD 10/16/16 3151    Elnora Morrison, MD 10/16/16 2333

## 2016-10-15 NOTE — ED Notes (Signed)
Repeated BP on rt arm: 206/78

## 2016-10-15 NOTE — Progress Notes (Addendum)
Spoke with rapid response and she aggress that patient is not appropriate for medsurg/ tele unit.  Vitals:   10/15/16 1724 10/15/16 1732  BP: (!) 198/100 (!) 194/72  Pulse: (!) 47   Resp: 16   Temp: 97.9 F (36.6 C)   hemoglobin of 6.1. One unit of blood in process with a second to follow.

## 2016-10-15 NOTE — H&P (Signed)
History and Physical    Jeffrey Campbell FVC:[id number] DOB: 1933-03-23 DOA: 10/15/2016  PCP: [name], MD  Patient coming from: Home  Chief Complaint:sent from clinic for low Hb  HPI: Jeffrey Campbell is a 83 y.o. male with medical history significant of coronary artery disease, carotid artery stenosis, hypertension, hyperlipidemia, hypothyroidism, chronic kidney disease stage IV, admitted about a month ago for hypertensive urgency and encephalopathy seen by the patient's nephrologist for abnormal lab test. Patient was seen by Dr. Justin Mend from nephrologist in the clinic today when her regular labs were done. The CBC was consistent with a hemoglobin of 6 therefore patient was sent to the ER for evaluation. Patient reported generalized weakness and mild shortness of breath. He noticed dark black colored stool for about a week. Denied nausea vomiting or abdominal pain. Denied chest pain, headache or dizziness. Denied any urinary symptoms. No fever chills. Patient's wife at bedside in the ER. ED Course: In the ER, lab consistent with a hemoglobin of 6.1 drop from last hemoglobin of 8.8 about a month ago. Serum creatinine stable. Plan to transfuse 2 units in the ER and admitted for further evaluation.  Review of Systems: As per HPI otherwise 10 point review of systems negative.    Past Medical History:  Diagnosis Date  . BPH (benign prostatic hyperplasia)   . CAD (coronary artery disease)   . Carotid artery stenosis    1-39% bilateral carotid stenosis by dopplers 2018  . CKD (chronic kidney disease), stage III   . Diastolic dysfunction   . Dilated aortic root ([place])    44mm by echo 02/2015  . Glaucoma   . Graves disease   . Heart murmur, systolic   . History of ETOH abuse   . History of PFTs 05/2010   moderate airflow obstruction w reduced DLCO by PFT   . Hyperlipemia   . Hypertension   . Hyperthyroidism 08/26/10   radioactive iodine therapy   . Mitral regurgitation echo 2015   mild  .  Multiple thyroid nodules   . MVP (mitral valve prolapse) 11/2012   posterior MVP  . Organic impotence   . OSA (obstructive sleep apnea)    upper airway resistance syndrome with RDI 18/hr - not on CPAP due to insurance not covering  . PUD (peptic ulcer disease)   . Pulmonary hypertension ([place]) echo 2015   moderate PASP 70mmHg  . Shutdown renal    after cardiac cath  . Type 2 diabetes, diet controlled ([place])   . Upper airway resistance syndrome     Past Surgical History:  Procedure Laterality Date  . APPENDECTOMY    . CARDIAC CATHETERIZATION    . CARDIAC CATHETERIZATION N/A 02/17/2015   Procedure: Right Heart Cath;  Surgeon: Larey Dresser, MD;  Location: [facility];  Service: Cardiovascular;  Laterality: N/A;  . heart catherization    . HERNIA REPAIR  10/2009  . RIGHT HEART CATH N/A 07/28/2016   Procedure: Right Heart Cath;  Surgeon: Larey Dresser, MD;  Location: [facility];  Service: Cardiovascular;  Laterality: N/A;    Social history: reports that he quit smoking about 32 years ago. His smoking use included Cigarettes. He has a 80.00 pack-year smoking history. He has never used smokeless tobacco. He reports that he does not drink alcohol or use drugs.  Allergies  Allergen Reactions  . Iodinated Diagnostic Agents Other (See Comments)    Right heart cath 07/2016 allegedly "shut down his kidneys" Patient  has stage III chronic kidney disease  . Zocor [Simvastatin] Other (See Comments)    Liver problems   . Tricor [Fenofibrate]        . Budesonide-Formoterol Fumarate Other (See Comments)    Dry mouth  . Tiotropium Bromide Monohydrate Other (See Comments)    Dry mouth    Family History  Problem Relation Age of Onset  . Kidney failure Father   . Hypertension Father   . Colon cancer Mother   . Colon cancer Brother   . Lung disease Brother   . Colon cancer Maternal Uncle      Prior to Admission medications   Medication Sig Start Date End Date Taking?  Authorizing Provider  amLODipine (NORVASC) 10 MG tablet Take 1 tablet (10 mg total) by mouth daily. 09/24/16 09/24/17  Dhungel, Flonnie Overman, MD  aspirin EC 81 MG tablet Take 81 mg by mouth daily.    [provider]  atorvastatin (LIPITOR) 10 MG tablet Take 1 tablet (10 mg total) by mouth daily. 09/24/16 09/24/17  Dhungel, Nishant, MD  Brinzolamide-Brimonidine (SIMBRINZA) 1-0.2 % SUSP Place 1 drop into both eyes 2 (two) times daily.     [provider]  doxazosin (CARDURA) 8 MG tablet Take 1 tablet (8 mg total) by mouth daily. 09/24/16   Dhungel, Nishant, MD  feeding supplement, ENSURE ENLIVE, (ENSURE ENLIVE) LIQD Take 237 mLs by mouth 2 (two) times daily between meals. 09/24/16   Dhungel, Flonnie Overman, MD  furosemide (LASIX) 40 MG tablet Take 1 tablet (40 mg total) by mouth daily as needed for fluid or edema. 09/24/16   Dhungel, Nishant, MD  hydrALAZINE (APRESOLINE) 100 MG tablet TAKE 1 TABLET (100 MG TOTAL) BY MOUTH 3 (THREE) TIMES DAILY. Patient taking differently: Take 100 mg by mouth 3 (three) times daily.  08/24/16   Sueanne Margarita, MD  hydrALAZINE (APRESOLINE) 100 MG tablet Take 1 tablet (100 mg total) by mouth 3 (three) times daily. MUST COMPLETE APT WITH CHF CLINIC FOR FURTHER REFILLS. 10/14/16   Larey Dresser, MD  levothyroxine (SYNTHROID, LEVOTHROID) 137 MCG tablet Take 137 mcg by mouth daily before breakfast.  06/28/16   [provider]  Multiple Vitamin (MULTIVITAMIN WITH MINERALS) TABS tablet Take 1 tablet by mouth daily.    [provider]  potassium chloride (K-DUR,KLOR-CON) 10 MEQ tablet Take 1 tablet (10 mEq total) by mouth daily. 09/19/15   Sueanne Margarita, MD  QUEtiapine (SEROQUEL) 25 MG tablet Take 1 tablet (25 mg total) by mouth at bedtime. 09/24/16   Louellen Molder, MD    Physical Exam: Vitals:   10/15/16 1408 10/15/16 1433 10/15/16 1530 10/15/16 1655  BP: (!) 182/64  (!) 87/71 (!) 204/76  Pulse: (!) 53  (!) 50   Resp: 17  (!) 23   Temp: 98 F (36.7  C)  98.6 F (37 C)   TempSrc: Oral  Oral   SpO2: 97%  98%   Weight:  61.2 kg (135 lb)    Height:  5\' 9"  (1.753 m)        Constitutional: NAD, calm, comfortable, pale skin Vitals:   10/15/16 1408 10/15/16 1433 10/15/16 1530 10/15/16 1655  BP: (!) 182/64  (!) 87/71 (!) 204/76  Pulse: (!) 53  (!) 50   Resp: 17  (!) 23   Temp: 98 F (36.7 C)  98.6 F (37 C)   TempSrc: Oral  Oral   SpO2: 97%  98%   Weight:  61.2 kg (135 lb)    Height:  5\' 9"  (1.753 m)     Eyes: PERRL,  ENMT: Mucous membranes are moist.  Neck: normal, supple,  Respiratory: clear to auscultation bilaterally, no wheezing, no crackles. Normal respiratory effort. No accessory muscle use.  Cardiovascular: Regular rate and rhythm, no murmurs / rubs / gallops. No extremity edema.   Abdomen: no tenderness, no masses palpated.  Bowel sounds positive.  Musculoskeletal: no clubbing / cyanosis. No joint deformity upper and lower extremities.  Skin: no rashes, lesions, ulcers. No induration Neurologic: Alert, awake and following commands. Nonfocal. Psychiatric: Normal judgment and insight. Alert and oriented x 3. Normal mood.    Labs on Admission: I have personally reviewed following labs and imaging studies  CBC:  Recent Labs Lab 10/15/16 1433  WBC 5.7  HGB 6.1*  HCT 19.6*  MCV 96.6  PLT 277   Basic Metabolic Panel:  Recent Labs Lab 10/15/16 1433  NA 141  K 4.4  CL 112*  CO2 21*  GLUCOSE 142*  BUN 61*  CREATININE 3.02*  CALCIUM 9.1   GFR: Estimated Creatinine Clearance: 16.6 mL/min (A) (by C-G formula based on SCr of 3.02 mg/dL (H)). Liver Function Tests:  Recent Labs Lab 10/15/16 1433  AST 28  ALT 21  ALKPHOS 68  BILITOT 1.0  PROT 6.9  ALBUMIN 3.6   No results for input(s): LIPASE, AMYLASE in the last 168 hours. No results for input(s): AMMONIA in the last 168 hours. Coagulation Profile:  Recent Labs Lab 10/15/16 1433  INR 1.00   Cardiac Enzymes: No results for input(s): CKTOTAL,  CKMB, CKMBINDEX, TROPONINI in the last 168 hours. BNP (last 3 results)  Recent Labs  04/20/16 1135 04/28/16 1046  PROBNP 1,703* 1,296*   HbA1C: No results for input(s): HGBA1C in the last 72 hours. CBG: No results for input(s): GLUCAP in the last 168 hours. Lipid Profile: No results for input(s): CHOL, HDL, LDLCALC, TRIG, CHOLHDL, LDLDIRECT in the last 72 hours. Thyroid Function Tests: No results for input(s): TSH, T4TOTAL, FREET4, T3FREE, THYROIDAB in the last 72 hours. Anemia Panel: No results for input(s): VITAMINB12, FOLATE, FERRITIN, TIBC, IRON, RETICCTPCT in the last 72 hours. Urine analysis:    Component Value Date/Time   COLORURINE AMBER (A) 09/16/2016 1820   APPEARANCEUR TURBID (A) 09/16/2016 1820   LABSPEC 1.014 09/16/2016 1820   PHURINE 5.0 09/16/2016 1820   GLUCOSEU NEGATIVE 09/16/2016 1820   HGBUR NEGATIVE 09/16/2016 1820   BILIRUBINUR NEGATIVE 09/16/2016 1820   KETONESUR NEGATIVE 09/16/2016 1820   PROTEINUR 100 (A) 09/16/2016 1820   NITRITE NEGATIVE 09/16/2016 1820   LEUKOCYTESUR LARGE (A) 09/16/2016 1820   Sepsis Labs: !!!!!!!!!!!!!!!!!!!!!!!!!!!!!!!!!!!!!!!!!!!! @LABRCNTIP (procalcitonin:4,lacticidven:4) )No results found for this or any previous visit (from the past 240 hour(s)).   Radiological Exams on Admission: No results found.  Assessment/Plan Active Problems:   GI bleeding   GI bleed  # Normocytic anemia likely multifactorial in the setting of subacute GI bleed and chronic kidney disease -Patient with mild shortness of breath and generalized weakness. Plan to transfuse 2 units of red blood cells in the ER for symptomatic anemia.  -Check iron studies -Monitor CBC  #Possible subacute upper GI bleed: Patient with fecal occult blood test positive, has melena, associated with symptomatic drop in hemoglobin. -Patient reported endoscopy a few years ago but does not remember the results. -GI consult requested to [facility]. Start Protonix IV twice a  day. Hold aspirin -Clear liquid diet and keep nothing by mouth from midnight for possible endoscopy evaluation in the morning.  #Hypertensive  urgency: Patient with fluctuation in blood pressure. During my evaluation in the ER patient has systolic blood pressure of 200s. I will resume patient's home antihypertensive medications except diuretics tonight. He looks drier/euvolemic on exam. Order hydralazine IV as needed. Avoid any AV nodal blocking agent because of sinus bradycardia.  #Sinus bradycardia: This is chronic. Monitor in telemetry floor. Avoid AV nodal agent.  #Hypothyroidism: Continue Synthroid.  #Chronic kidney disease stage IV: Serum creatinine level better than before. Electrolytes acceptable. Patient follows up with his nephrologist outpatient. Monitor BMP. Avoid nephrotoxins.  #History of renal artery stenosis: Follows up with interventional radiologist and nephrologist for further evaluation.  PT OT and case manager evaluation for safe discharge planning Continue current medical and supportive care  DVT prophylaxis: SCDs. No anticoagulation because of possible GI bleed Code Status: Full code Family Communication: Discussed with the patient's wife at bedside Disposition Plan: Admission to the telemetry floor Consults called: GI Admission status: Inpatient because of severity of anemia, symptoms and require test/investigations. Patient with multiple comorbidities   Chancy Claros Tanna Furry MD [facility] Pager [phone]  If 7PM-7AM, please contact night-coverage [email or web address] Password [facility]  10/15/2016, 4:56 PM

## 2016-10-15 NOTE — ED Notes (Signed)
Admitting RN unsure pt appropriate for telemetry unit, will send rapid response RN to assess.

## 2016-10-15 NOTE — ED Notes (Signed)
Attempted report x1. 

## 2016-10-15 NOTE — ED Notes (Signed)
Patient provided with urinal due to urinary frequency.  Patient instructed to remain in the bed and use call bell to contact staff in the case he needs a new urinal.  Patient and family verbalized understanding.

## 2016-10-16 ENCOUNTER — Encounter (HOSPITAL_COMMUNITY)
Admission: EM | Disposition: A | Discharge status: Home Health | Payer: Self-pay | Source: Home / Self Care | Attending: Nephrology

## 2016-10-16 ENCOUNTER — Encounter (HOSPITAL_COMMUNITY): Payer: Self-pay | Admitting: Certified Registered"

## 2016-10-16 ENCOUNTER — Inpatient Hospital Stay (HOSPITAL_COMMUNITY): Payer: Medicare Other | Admitting: Anesthesiology

## 2016-10-16 DIAGNOSIS — R001 Bradycardia, unspecified: Secondary | ICD-10-CM

## 2016-10-16 HISTORY — PX: ESOPHAGOGASTRODUODENOSCOPY (EGD) WITH PROPOFOL: SHX5813

## 2016-10-16 LAB — TYPE AND SCREEN
ABO/RH(D): O POS
Antibody Screen: NEGATIVE
UNIT DIVISION: 0
Unit division: 0

## 2016-10-16 LAB — BASIC METABOLIC PANEL
Anion gap: 6 (ref 5–15)
BUN: 57 mg/dL — AB (ref 6–20)
CO2: 21 mmol/L — AB (ref 22–32)
Calcium: 8.7 mg/dL — ABNORMAL LOW (ref 8.9–10.3)
Chloride: 113 mmol/L — ABNORMAL HIGH (ref 101–111)
Creatinine, Ser: 2.82 mg/dL — ABNORMAL HIGH (ref 0.61–1.24)
GFR calc Af Amer: 23 mL/min — ABNORMAL LOW (ref >60)
GFR calc non Af Amer: 20 mL/min — ABNORMAL LOW (ref >60)
GLUCOSE: 94 mg/dL (ref 65–99)
POTASSIUM: 3.7 mmol/L (ref 3.5–5.1)
Sodium: 140 mmol/L (ref 135–145)

## 2016-10-16 LAB — CBC
HEMATOCRIT: 22.7 % — AB (ref 39.0–52.0)
Hemoglobin: 7.3 g/dL — ABNORMAL LOW (ref 13.0–17.0)
MCH: 30.4 pg (ref 26.0–34.0)
MCHC: 32.2 g/dL (ref 30.0–36.0)
MCV: 94.6 fL (ref 78.0–100.0)
Platelets: 215 10*3/uL (ref 150–400)
RBC: 2.4 MIL/uL — ABNORMAL LOW (ref 4.22–5.81)
RDW: 17.4 % — AB (ref 11.5–15.5)
WBC: 6.4 10*3/uL (ref 4.0–10.5)

## 2016-10-16 LAB — IRON AND TIBC
Iron: 31 ug/dL — ABNORMAL LOW (ref 45–182)
Saturation Ratios: 12 % — ABNORMAL LOW (ref 17.9–39.5)
TIBC: 256 ug/dL (ref 250–450)
UIBC: 225 ug/dL

## 2016-10-16 LAB — BPAM RBC
BLOOD PRODUCT EXPIRATION DATE: 201808192359
BLOOD PRODUCT EXPIRATION DATE: 201808202359
ISSUE DATE / TIME: 201807202106
ISSUE DATE / TIME: 201807201648
UNIT TYPE AND RH: 5100
UNIT TYPE AND RH: 5100

## 2016-10-16 LAB — FERRITIN: Ferritin: 74 ng/mL (ref 24–336)

## 2016-10-16 SURGERY — ESOPHAGOGASTRODUODENOSCOPY (EGD) WITH PROPOFOL
Anesthesia: Monitor Anesthesia Care | Laterality: Left

## 2016-10-16 MED ORDER — PANTOPRAZOLE SODIUM 40 MG PO TBEC
40.0000 mg | DELAYED_RELEASE_TABLET | Freq: Every day | ORAL | Status: DC
  Administered 2016-10-16 – 2016-10-17 (×2): 40 mg via ORAL
  Filled 2016-10-16 (×2): qty 1

## 2016-10-16 MED ORDER — PROPOFOL 10 MG/ML IV BOLUS
INTRAVENOUS | Status: DC | PRN
  Administered 2016-10-16: 70 mg via INTRAVENOUS

## 2016-10-16 MED ORDER — PANTOPRAZOLE SODIUM 40 MG PO TBEC: 40.0000 mg | DELAYED_RELEASE_TABLET | Freq: Every day | ORAL | Status: DC

## 2016-10-16 MED ORDER — FERROUS SULFATE 325 (65 FE) MG PO TABS
325.0000 mg | ORAL_TABLET | Freq: Two times a day (BID) | ORAL | Status: DC
  Administered 2016-10-16 – 2016-10-17 (×3): 325 mg via ORAL
  Filled 2016-10-16 (×3): qty 1

## 2016-10-16 MED ORDER — HYDRALAZINE HCL 20 MG/ML IJ SOLN
10.0000 mg | Freq: Once | INTRAMUSCULAR | Status: AC
  Administered 2016-10-16: 10 mg via INTRAVENOUS
  Filled 2016-10-16: qty 1

## 2016-10-16 MED ORDER — LIDOCAINE 2% (20 MG/ML) 5 ML SYRINGE
INTRAMUSCULAR | Status: DC | PRN
  Administered 2016-10-16: 40 mg via INTRAVENOUS

## 2016-10-16 MED ORDER — SODIUM CHLORIDE 0.9 % IV SOLN
INTRAVENOUS | Status: DC
  Administered 2016-10-16: 11:00:00 via INTRAVENOUS

## 2016-10-16 MED ORDER — SODIUM CHLORIDE 0.9 % IV SOLN
INTRAVENOUS | Status: DC | PRN
  Administered 2016-10-16: 12:00:00 via INTRAVENOUS

## 2016-10-16 MED ORDER — GLYCOPYRROLATE 0.2 MG/ML IJ SOLN
INTRAMUSCULAR | Status: DC | PRN
  Administered 2016-10-16: 0.1 mg via INTRAVENOUS

## 2016-10-16 NOTE — Op Note (Signed)
EGD showed an unremarkable esophagus. Z line was located 42 cm and was unremarkable. Few , diminutive, nonbleeding antral erosions were noted. There were no ulcers, AVMs, evidence of active or recent bleeding. The entire duodenal bulb and duodenum appeared unremarkable.  Rectal examination showed formed dark green stools, no evidence of lower GI bleeding.

## 2016-10-16 NOTE — Progress Notes (Signed)
PROGRESS NOTE    Jeffrey Campbell  DDU:[id number] DOB: 1932/07/13 DOA: 10/15/2016 PCP: [name], MD   Brief Narrative: 83 y.o. male with medical history significant of coronary artery disease, carotid artery stenosis, hypertension, hyperlipidemia, hypothyroidism, chronic kidney disease stage IV, admitted about a month ago for hypertensive urgency and encephalopathy sent to ER by patient's nephrologist for low Hb.  Assessment & Plan:   # Normocytic/ iron deficiency anemia due to multifactorial etiology in the setting of subacute GI bleed and chronic kidney disease -Patient with mild shortness of breath and generalized weakness on admission. Received 2 units of red blood cell transfusion. He is feeling better. Started oral iron for iron deficiency. Repeat CBC in the morning.  #Possible subacute upper GI bleed: Patient with fecal occult blood test positive. -Underwent upper GI endoscopy with no sign of bleeding. Discussed with Dr. Therisa Doyne from GI. Discontinue PPI drip and switch to oral Protonix. Diet advanced. Hemoglobin is not responding well after 2 units of blood transfusion. Plan to repeat CBC in the morning.  #Hypertensive urgency: Fluctuation in blood pressure. Continue current medications. Did not receive a.m. meds because of endoscopy. Avoid AV nodal blocker because of bradycardia. Continue to monitor.  #Sinus bradycardia: This is chronic. Monitor in telemetry floor. Avoid AV nodal agent.  #Hypothyroidism: Continue Synthroid.  #Chronic kidney disease stage IV: Serum creatinine level better than before. Electrolytes acceptable. Patient follows up with his nephrologist outpatient. Monitor BMP. Avoid nephrotoxins.  #History of renal artery stenosis: Follows up with interventional radiologist and nephrologist for further evaluation.  PT OT and case manager evaluation for safe discharge planning Continue current medical and supportive care   DVT prophylaxis: SCD Code  Status: Full code Family Communication: No family at bedside Disposition Plan: Likely discharge home tomorrow    Consultants:   GI  Procedures: Endoscopy Antimicrobials: None  Subjective: Seen and examined at bedside. Denied headache, dizziness, nausea vomiting chest pain or shortness of breath. No active bleeding.  Objective: Vitals:   10/16/16 0921 10/16/16 1130 10/16/16 1154 10/16/16 1204  BP: (!) 160/64 (!) 214/57 (!) 191/59 (!) 196/56  Pulse:  (!) 50 (!) 53 (!) 51  Resp:  20 16 14   Temp:  97.9 F (36.6 C) 97.8 F (36.6 C)   TempSrc:  Oral Oral   SpO2:  99% 99% 97%  Weight:      Height:        Intake/Output Summary (Last 24 hours) at 10/16/16 1220 Last data filed at 10/16/16 1155  Gross per 24 hour  Intake             1013 ml  Output              350 ml  Net              663 ml   Filed Weights   10/15/16 1433 10/15/16 2030  Weight: 61.2 kg (135 lb) 65.4 kg (144 lb 2.9 oz)    Examination:  General exam: Appears calm and comfortable  Respiratory system: Clear to auscultation. Respiratory effort normal. No wheezing or crackle Cardiovascular system: S1 & S2 heard, RRR.  No pedal edema. Gastrointestinal system: Abdomen is nondistended, soft and nontender. Normal bowel sounds heard. Central nervous system: Alert and oriented. No focal neurological deficits. Extremities: Symmetric 5 x 5 power. Skin: No rashes, lesions or ulcers    Data Reviewed: I have personally reviewed following labs and imaging studies  CBC:  Recent Labs Lab 10/15/16 1433 10/16/16 0233  WBC 5.7 6.4  HGB 6.1* 7.3*  HCT 19.6* 22.7*  MCV 96.6 94.6  PLT 222 226   Basic Metabolic Panel:  Recent Labs Lab 10/15/16 1433 10/16/16 0233  NA 141 140  K 4.4 3.7  CL 112* 113*  CO2 21* 21*  GLUCOSE 142* 94  BUN 61* 57*  CREATININE 3.02* 2.82*  CALCIUM 9.1 8.7*   GFR: Estimated Creatinine Clearance: 19 mL/min (A) (by C-G formula based on SCr of 2.82 mg/dL (H)). Liver Function  Tests:  Recent Labs Lab 10/15/16 1433  AST 28  ALT 21  ALKPHOS 68  BILITOT 1.0  PROT 6.9  ALBUMIN 3.6   No results for input(s): LIPASE, AMYLASE in the last 168 hours. No results for input(s): AMMONIA in the last 168 hours. Coagulation Profile:  Recent Labs Lab 10/15/16 1433  INR 1.00   Cardiac Enzymes: No results for input(s): CKTOTAL, CKMB, CKMBINDEX, TROPONINI in the last 168 hours. BNP (last 3 results)  Recent Labs  04/20/16 1135 04/28/16 1046  PROBNP 1,703* 1,296*   HbA1C: No results for input(s): HGBA1C in the last 72 hours. CBG: No results for input(s): GLUCAP in the last 168 hours. Lipid Profile: No results for input(s): CHOL, HDL, LDLCALC, TRIG, CHOLHDL, LDLDIRECT in the last 72 hours. Thyroid Function Tests: No results for input(s): TSH, T4TOTAL, FREET4, T3FREE, THYROIDAB in the last 72 hours. Anemia Panel:  Recent Labs  10/16/16 0233  FERRITIN 74  TIBC 256  IRON 31*   Sepsis Labs: No results for input(s): PROCALCITON, LATICACIDVEN in the last 168 hours.  No results found for this or any previous visit (from the past 240 hour(s)).       Radiology Studies: No results found.      Scheduled Meds: . amLODipine  10 mg Oral Daily  . atorvastatin  10 mg Oral q1800  . doxazosin  8 mg Oral QHS  . feeding supplement (ENSURE ENLIVE)  237 mL Oral BID BM  . ferrous sulfate  325 mg Oral BID WC  . hydrALAZINE  100 mg Oral Q8H  . levothyroxine  137 mcg Oral QAC breakfast  . multivitamin with minerals  1 tablet Oral Daily  . pantoprazole  40 mg Oral Daily  . potassium chloride  10 mEq Oral Daily  . QUEtiapine  25 mg Oral QHS   Continuous Infusions:   LOS: 1 day    Dron Tanna Furry, MD [facility] Pager [phone]  If 7PM-7AM, please contact night-coverage [email or web address] Password [email or web address] 10/16/2016, 12:20 PM

## 2016-10-16 NOTE — Op Note (Signed)
[facility] Patient Name: Jeffrey Campbell Procedure Date : 10/16/2016 MRN: [id number] Attending MD: Ronnette Juniper , MD Date of Birth: 08-11-1932 CSN: [id number] Age: 83 Admit Type: Inpatient Procedure:                Upper GI endoscopy Indications:              Unexplained iron deficiency anemia, Occult blood in                            stool Providers:                Ronnette Juniper, MD, Vista Lawman, RN, Elspeth Cho                            Tech., Technician Referring MD:              Medicines:                Monitored Anesthesia Care Complications:            No immediate complications. Estimated Blood Loss:     Estimated blood loss: none. Procedure:                Pre-Anesthesia Assessment:                           - Prior to the procedure, a History and Physical                            was performed, and patient medications and                            allergies were reviewed. The patient's tolerance of                            previous anesthesia was also reviewed. The risks                            and benefits of the procedure and the sedation                            options and risks were discussed with the patient.                            All questions were answered, and informed consent                            was obtained. Prior Anticoagulants: The patient has                            taken aspirin, last dose was 1 day prior to                            procedure. ASA Grade Assessment: IV - A patient  with severe systemic disease that is a constant                            threat to life. After reviewing the risks and                            benefits, the patient was deemed in satisfactory                            condition to undergo the procedure.                           After obtaining informed consent, the endoscope was                            passed under direct vision. Throughout the                 procedure, the patient's blood pressure, pulse, and                            oxygen saturations were monitored continuously. The                            [id number] ([id number]) scope was introduced through the                            mouth, and advanced to the second part of duodenum.                            The upper GI endoscopy was accomplished without                            difficulty. The patient tolerated the procedure                            well. Scope In: Scope Out: Findings:      The examined esophagus was normal.      The Z-line was regular and was found 42 cm from the incisors.      A few localized, diminutive non-bleeding erosions were found in the       gastric antrum. There were no stigmata of recent bleeding.      The exam was otherwise without abnormality.      The cardia and gastric fundus were normal on retroflexion.      The examined duodenum was normal. Impression:               - Normal esophagus.                           - Z-line regular, 42 cm from the incisors.                           - Non-bleeding erosive gastropathy.                           -  The examination was otherwise normal.                           - Normal examined duodenum.                           - No specimens collected. Moderate Sedation:      Patient did not receive moderate sedation for this procedure, but       instead received monitored anesthesia care. Recommendation:           - Advance diet as tolerated.                           - Continue present medications.                           - Rectal exam at bedside showed formed, dark green                            stools, no evidence of lower GI bleeding.                           - D/C protonix drip, Protonix 40 mg a day.                           - Ok to d/c from GI standpoint if Hb remains stable.                           - Return patient to hospital ward for ongoing care. Procedure Code(s):         --- Professional ---                           [phone], Esophagogastroduodenoscopy, flexible,                            transoral; diagnostic, including collection of                            specimen(s) by brushing or washing, when performed                            (separate procedure) Diagnosis Code(s):        --- Professional ---                           K31.89, Other diseases of stomach and duodenum                           D50.9, Iron deficiency anemia, unspecified                           R19.5, Other fecal abnormalities CPT copyright 2016 [employer]. All rights reserved. The codes documented in this report are preliminary and upon coder review may  be revised to meet current compliance requirements. Ronnette Juniper, MD 10/16/2016 11:54:40 AM This report has been signed electronically.  Number of Addenda: 0 

## 2016-10-16 NOTE — Anesthesia Preprocedure Evaluation (Signed)
Anesthesia Evaluation    Airway Mallampati: II  TM Distance: >3 FB Neck ROM: Full    Dental no notable dental hx.    Pulmonary sleep apnea , COPD, former smoker,    Pulmonary exam normal breath sounds clear to auscultation       Cardiovascular hypertension, + CAD and + Peripheral Vascular Disease  Normal cardiovascular exam+ Valvular Problems/Murmurs MR  Rhythm:Regular Rate:Normal  Left ventricle: The cavity size was normal. There was mild   concentric hypertrophy. Systolic function was normal. The   estimated ejection fraction was in the range of 60% to 65%. Wall   motion was normal; there were no regional wall motion   abnormalities. Doppler parameters are consistent with abnormal   left ventricular relaxation (grade 1 diastolic dysfunction).   Doppler parameters are consistent with high ventricular filling   pressure. - Aortic valve: Transvalvular velocity was within the normal range.   There was no stenosis. There was no regurgitation. - Mitral valve: Transvalvular velocity was within the normal range.   There was no evidence for stenosis. There was trivial   regurgitation. - Left atrium: The atrium was severely dilated. - Right ventricle: The cavity size was mildly dilated. Wall   thickness was normal. Systolic function was normal. - Right atrium: The atrium was severely dilated. - Tricuspid valve: There was mild regurgitation. - Pulmonary arteries: Systolic pressure was mildly increased. PA   peak pressure: 46 mm Hg (S).   Neuro/Psych    GI/Hepatic   Endo/Other  diabetesHyperthyroidism   Renal/GU Renal InsufficiencyRenal disease     Musculoskeletal   Abdominal   Peds  Hematology  (+) anemia ,   Anesthesia Other Findings   Reproductive/Obstetrics                             Anesthesia Physical Anesthesia Plan  ASA: IV  Anesthesia Plan: MAC   Post-op Pain Management:     Induction: Intravenous  PONV Risk Score and Plan:   Airway Management Planned: Simple Face Mask  Additional Equipment:   Intra-op Plan:   Post-operative Plan:   Informed Consent: I have reviewed the patients History and Physical, chart, labs and discussed the procedure including the risks, benefits and alternatives for the proposed anesthesia with the patient or authorized representative who has indicated his/her understanding and acceptance.   Dental advisory given  Plan Discussed with: CRNA and Surgeon  Anesthesia Plan Comments:         Anesthesia Quick Evaluation

## 2016-10-16 NOTE — Anesthesia Postprocedure Evaluation (Signed)
Anesthesia Post Note  Patient: ANES RIGEL  Procedure(s) Performed: Procedure(s) (LRB): ESOPHAGOGASTRODUODENOSCOPY (EGD) WITH PROPOFOL (Left)     Patient location during evaluation: PACU Anesthesia Type: MAC Level of consciousness: awake and alert Pain management: pain level controlled Vital Signs Assessment: post-procedure vital signs reviewed and stable Respiratory status: spontaneous breathing, nonlabored ventilation, respiratory function stable and patient connected to nasal cannula oxygen Cardiovascular status: stable and blood pressure returned to baseline Anesthetic complications: no    Last Vitals:  Vitals:   10/16/16 1204 10/16/16 1232  BP: (!) 196/56 (!) 156/70  Pulse: (!) 51   Resp: 14   Temp:      Last Pain:  Vitals:   10/16/16 1215  TempSrc:   PainSc: 0-No pain                 Damir Leung S

## 2016-10-16 NOTE — Consult Note (Signed)
[facility] Consult  Referring Provider: Rosita Fire, MD Primary Care Physician:  [name], MD Primary Gastroenterologist: [facility]  Reason for Consultation:  Anemia, black stools  HPI: Jeffrey Campbell is a 83 y.o. male was admitted on 10/15/16 after findings of low hemoglobin and outpatient blood work from his nephrologist. Patient states that for the last few days, he has noticed that his stools have been dark, maybe black. He complains of pain in the left pelvic area. He has not had any nausea vomiting. He has not noted blood in stool. He reports having a colonoscopy a few years ago by Dr. Earlean Shawl, for screening, results unavailable.\ As per his wife he has had an endoscopy in the past, and has had history of peptic ulcer disease. Patient denies any recent loss of appetite, unintentional weight loss, difficulty swallowing or pain on swallowing.   Past Medical History:  Diagnosis Date  . BPH (benign prostatic hyperplasia)   . CAD (coronary artery disease)   . Carotid artery stenosis    1-39% bilateral carotid stenosis by dopplers 2018  . CKD (chronic kidney disease), stage III   . Diastolic dysfunction   . Dilated aortic root ([place])    88mm by echo 02/2015  . Glaucoma   . Graves disease   . Heart murmur, systolic   . History of ETOH abuse   . History of PFTs 05/2010   moderate airflow obstruction w reduced DLCO by PFT   . Hyperlipemia   . Hypertension   . Hyperthyroidism 08/26/10   radioactive iodine therapy   . Mitral regurgitation echo 2015   mild  . Multiple thyroid nodules   . MVP (mitral valve prolapse) 11/2012   posterior MVP  . Organic impotence   . OSA (obstructive sleep apnea)    upper airway resistance syndrome with RDI 18/hr - not on CPAP due to insurance not covering  . PUD (peptic ulcer disease)   . Pulmonary hypertension ([place]) echo 2015   moderate PASP 52mmHg  . Shutdown renal    after cardiac cath  . Type 2 diabetes, diet  controlled ([place])   . Upper airway resistance syndrome     Past Surgical History:  Procedure Laterality Date  . APPENDECTOMY    . CARDIAC CATHETERIZATION    . CARDIAC CATHETERIZATION N/A 02/17/2015   Procedure: Right Heart Cath;  Surgeon: Larey Dresser, MD;  Location: [facility];  Service: Cardiovascular;  Laterality: N/A;  . heart catherization    . HERNIA REPAIR  10/2009  . RIGHT HEART CATH N/A 07/28/2016   Procedure: Right Heart Cath;  Surgeon: Larey Dresser, MD;  Location: [facility];  Service: Cardiovascular;  Laterality: N/A;    Prior to Admission medications   Medication Sig Start Date End Date Taking? Authorizing Provider  amLODipine (NORVASC) 10 MG tablet Take 1 tablet (10 mg total) by mouth daily. 09/24/16 09/24/17 Yes Dhungel, Nishant, MD  aspirin EC 81 MG tablet Take 81 mg by mouth daily.   Yes [provider]  atorvastatin (LIPITOR) 10 MG tablet Take 1 tablet (10 mg total) by mouth daily. 09/24/16 09/24/17 Yes Dhungel, Nishant, MD  Brinzolamide-Brimonidine (SIMBRINZA) 1-0.2 % SUSP Place 1 drop into both eyes 2 (two) times daily.    Yes [provider]  doxazosin (CARDURA) 8 MG tablet Take 1 tablet (8 mg total) by mouth daily. 09/24/16  Yes Dhungel, Nishant, MD  feeding supplement, ENSURE ENLIVE, (ENSURE ENLIVE) LIQD Take 237 mLs by mouth  2 (two) times daily between meals. 09/24/16  Yes Dhungel, Nishant, MD  furosemide (LASIX) 40 MG tablet Take 1 tablet (40 mg total) by mouth daily as needed for fluid or edema. 09/24/16  Yes Dhungel, Nishant, MD  hydrALAZINE (APRESOLINE) 100 MG tablet TAKE 1 TABLET (100 MG TOTAL) BY MOUTH 3 (THREE) TIMES DAILY. Patient taking differently: Take 100 mg by mouth 3 (three) times daily.  08/24/16  Yes Turner, Eber Hong, MD  levothyroxine (SYNTHROID, LEVOTHROID) 137 MCG tablet Take 137 mcg by mouth daily before breakfast.  06/28/16  Yes [provider]  Multiple Vitamin (MULTIVITAMIN WITH MINERALS) TABS tablet Take 1  tablet by mouth daily.   Yes [provider]  potassium chloride (K-DUR,KLOR-CON) 10 MEQ tablet Take 1 tablet (10 mEq total) by mouth daily. 09/19/15  Yes Turner, Eber Hong, MD  QUEtiapine (SEROQUEL) 25 MG tablet Take 1 tablet (25 mg total) by mouth at bedtime. 09/24/16  Yes Dhungel, Nishant, MD  hydrALAZINE (APRESOLINE) 100 MG tablet Take 1 tablet (100 mg total) by mouth 3 (three) times daily. MUST COMPLETE APT WITH CHF CLINIC FOR FURTHER REFILLS. Patient not taking: Reported on 10/15/2016 10/14/16   Larey Dresser, MD    Current Facility-Administered Medications  Medication Dose Route Frequency Provider Last Rate Last Dose  . [MAR Hold] acetaminophen (TYLENOL) tablet 650 mg  650 mg Oral Q6H PRN Rosita Fire, MD       Or  . Doug Sou Hold] acetaminophen (TYLENOL) suppository 650 mg  650 mg Rectal Q6H PRN Rosita Fire, MD      . Doug Sou Hold] amLODipine (NORVASC) tablet 10 mg  10 mg Oral Daily Rosita Fire, MD   10 mg at 10/15/16 2005  . [MAR Hold] atorvastatin (LIPITOR) tablet 10 mg  10 mg Oral q1800 Rosita Fire, MD      . Doug Sou Hold] doxazosin (CARDURA) tablet 8 mg  8 mg Oral QHS Rosita Fire, MD   8 mg at 10/16/16 0001  . [MAR Hold] feeding supplement (ENSURE ENLIVE) (ENSURE ENLIVE) liquid 237 mL  237 mL Oral BID BM Rosita Fire, MD      . Doug Sou Hold] hydrALAZINE (APRESOLINE) injection 10 mg  10 mg Intravenous Q6H PRN Rosita Fire, MD   10 mg at 10/16/16 0038  . [MAR Hold] hydrALAZINE (APRESOLINE) tablet 100 mg  100 mg Oral Q8H Rosita Fire, MD   100 mg at 10/16/16 8416  . [MAR Hold] levothyroxine (SYNTHROID, LEVOTHROID) tablet 137 mcg  137 mcg Oral QAC breakfast Rosita Fire, MD      . Doug Sou Hold] multivitamin with minerals tablet 1 tablet  1 tablet Oral Daily Rosita Fire, MD      . Doug Sou Hold] ondansetron [facility]) tablet 4 mg  4 mg Oral Q6H PRN Rosita Fire, MD       Or  . Doug Sou Hold] ondansetron  [facility]) injection 4 mg  4 mg Intravenous Q6H PRN Rosita Fire, MD      . pantoprazole (PROTONIX) 80 mg in sodium chloride 0.9 % 250 mL (0.32 mg/mL) infusion  8 mg/hr Intravenous Continuous Valda Lamb, MD 25 mL/hr at 10/16/16 0723 8 mg/hr at 10/16/16 0723  . [MAR Hold] polyethylene glycol (MIRALAX / GLYCOLAX) packet 17 g  17 g Oral Daily PRN Rosita Fire, MD      . Doug Sou Hold] potassium chloride (K-DUR,KLOR-CON) CR tablet 10 mEq  10 mEq Oral Daily Rosita Fire, MD      . [  MAR Hold] QUEtiapine (SEROQUEL) tablet 25 mg  25 mg Oral QHS Rosita Fire, MD   25 mg at 10/16/16 0000    Allergies as of 10/15/2016 - Review Complete 10/15/2016  Allergen Reaction Noted  . Iodinated diagnostic agents Other (See Comments) 09/28/2016  . Zocor [simvastatin] Other (See Comments) 09/21/2010  . Tricor [fenofibrate]  05/29/2013  . Budesonide-formoterol fumarate Other (See Comments)   . Tiotropium bromide monohydrate Other (See Comments) 01/26/2011    Family History  Problem Relation Age of Onset  . Kidney failure Father   . Hypertension Father   . Colon cancer Mother   . Colon cancer Brother   . Lung disease Brother   . Colon cancer Maternal Uncle     Social History   Social History  . Marital status: Married    Spouse name: N/A  . Number of children: 4  . Years of education: N/A   Occupational History  . Retired Retired    [employer]   Social History Main Topics  . Smoking status: Former Smoker    Packs/day: 2.00    Years: 40.00    Types: Cigarettes    Quit date: 03/29/1984  . Smokeless tobacco: Never Used  . Alcohol use No     Comment: quit in 1981  . Drug use: No  . Sexual activity: Not on file   Other Topics Concern  . Not on file   Social History Narrative  . No narrative on file    Review of Systems: Positive for: GI: Described in detail in HPI.    Gen: Complains of fatigue and weakness, Denies any fever, chills, rigors, night  sweats, anorexia,  malaise, involuntary weight loss, and sleep disorder CV: Denies chest pain, angina, palpitations, syncope, orthopnea, PND, peripheral edema, and claudication. Resp: Denies dyspnea, cough, sputum, wheezing, coughing up blood. GU : Denies urinary burning, blood in urine, urinary frequency, urinary hesitancy, nocturnal urination, and urinary incontinence. MS: Denies joint pain or swelling.  Denies muscle weakness, cramps, atrophy.  Derm: Denies rash, itching, oral ulcerations, hives, unhealing ulcers.  Psych: Denies depression, anxiety, memory loss, suicidal ideation, hallucinations,  and confusion. Heme: Denies bruising, bleeding, and enlarged lymph nodes. Neuro:  Denies any headaches, dizziness, paresthesias. Endo:  Denies any problems with DM, thyroid, adrenal function.  Physical Exam: Vital signs in last 24 hours: Temp:  [97.6 F (36.4 C)-98.6 F (37 C)] 97.7 F (36.5 C) (07/21 0820) Pulse Rate:  [43-53] 44 (07/21 0820) Resp:  [13-23] 13 (07/21 0820) BP: (87-204)/(56-176) 160/64 (07/21 0921) SpO2:  [96 %-100 %] 98 % (07/21 0820) Weight:  [61.2 kg (135 lb)-65.4 kg (144 lb 2.9 oz)] 65.4 kg (144 lb 2.9 oz) (07/20 2030) Last BM Date: 10/15/16  General:   Alert,  Well-developed, well-nourished, pleasant and cooperative in NAD Head:  Normocephalic and atraumatic. Eyes:  Sclera clear, no icterus.   Obvious pallor Ears:  Normal auditory acuity. Nose:  No deformity, discharge,  or lesions. Mouth:  No deformity or lesions.  Oropharynx pink & moist. Neck:  Supple; no masses or thyromegaly. Lungs:  Clear throughout to auscultation.   No wheezes, crackles, or rhonchi. No acute distress. Heart:  Regular rate and rhythm; no murmurs, clicks, rubs,  or gallops. Extremities:  Without clubbing or edema. Neurologic:  Alert and  oriented x4;  grossly normal neurologically. Skin:  Intact without significant lesions or rashes. Psych:  Alert and cooperative. Normal mood and  affect. Abdomen:  Soft, nontender and nondistended. No masses,  hepatosplenomegaly or hernias noted. Normal bowel sounds, without guarding, and without rebound.         Lab Results:  Recent Labs  10/15/16 1433 10/16/16 0233  WBC 5.7 6.4  HGB 6.1* 7.3*  HCT 19.6* 22.7*  PLT 222 215   BMET  Recent Labs  10/15/16 1433 10/16/16 0233  NA 141 140  K 4.4 3.7  CL 112* 113*  CO2 21* 21*  GLUCOSE 142* 94  BUN 61* 57*  CREATININE 3.02* 2.82*  CALCIUM 9.1 8.7*   LFT  Recent Labs  10/15/16 1433  PROT 6.9  ALBUMIN 3.6  AST 28  ALT 21  ALKPHOS 68  BILITOT 1.0   PT/INR  Recent Labs  10/15/16 1433  LABPROT 13.2  INR 1.00    Studies/Results: No results found.  Impression: 1. Hemoglobin 6.1, was 8.8 on 09/19/16, normocytic, normal platelets 2. Elevated BUN, the patient has chronic kidney disease with elevated creatinine and low GFR 3. Iron profile shows an iron saturation of 12%, ferritin of 74, TIBC 256 4. Posttransfusion hemoglobin 7.3    Plan: 1. Diagnostic EGD, consider peptic ulcer disease, AV malformations, gastric or duodenal erosions, less likely malignancy 2. On Protonix drip, remains bradycardic, no evidence of hypertension   LOS: 1 day   Ronnette Juniper, M.D. 10/16/2016, 11:22 AM  Pager [phone] If no answer or after 5 PM call [phone]

## 2016-10-16 NOTE — Progress Notes (Signed)
Pts BP was 177/146. Hydralizine was due at 2200, went ahead and gave med. Will recheck BP within an hour. Continue to monitor the pt.

## 2016-10-16 NOTE — Interval H&P Note (Signed)
History and Physical Interval Note: 83 year old male with normocytic anemia and dark stools, rule out upper GI bleeding.  10/16/2016 11:28 AM  Jeffrey Campbell  has presented today for EGD, with the diagnosis of anemia, dark stools?melena  The various methods of treatment have been discussed with the patient and family. After consideration of risks, benefits and other options for treatment, the patient has consented to  Procedure(s): ESOPHAGOGASTRODUODENOSCOPY (EGD) WITH PROPOFOL (Left) as a surgical intervention .  The patient's history has been reviewed, patient examined, no change in status, stable for surgery.  I have reviewed the patient's chart and labs.  Questions were answered to the patient's satisfaction.     Ronnette Juniper

## 2016-10-16 NOTE — Brief Op Note (Signed)
10/15/2016 - 10/16/2016  11:54 AM  PATIENT:  Jeffrey Campbell  83 y.o. male  PRE-OPERATIVE DIAGNOSIS:  anemia, dark stools?melena  POST-OPERATIVE DIAGNOSIS:  minimal antral erosions  PROCEDURE:  Procedure(s): ESOPHAGOGASTRODUODENOSCOPY (EGD) WITH PROPOFOL (Left)  SURGEON:  Surgeon(s) and Role:    Ronnette Juniper, MD - Primary  PHYSICIAN ASSISTANT:   ASSISTANTS: none   ANESTHESIA:   MAC  EBL:  Total I/O In: -  Out: 350 [Urine:350]  BLOOD ADMINISTERED:none  DRAINS: none   LOCAL MEDICATIONS USED:  NONE  SPECIMEN:  No Specimen  DISPOSITION OF SPECIMEN:  N/A  COUNTS:  YES  TOURNIQUET:  * No tourniquets in log *  DICTATION: .Dragon Dictation  PLAN OF CARE: Admit to inpatient   PATIENT DISPOSITION:  PACU - hemodynamically stable.   Delay start of Pharmacological VTE agent (>24hrs) due to surgical blood loss or risk of bleeding: no

## 2016-10-16 NOTE — Anesthesia Procedure Notes (Signed)
Procedure Name: MAC Date/Time: 10/16/2016 11:44 AM Performed by: Orlie Dakin Pre-anesthesia Checklist: Timeout performed, Patient being monitored, Suction available, Emergency Drugs available and Patient identified Patient Re-evaluated:Patient Re-evaluated prior to induction Oxygen Delivery Method: Nasal cannula Preoxygenation: Pre-oxygenation with 100% oxygen

## 2016-10-16 NOTE — Progress Notes (Signed)
Physical Therapy Evaluation Patient Details Name: Jeffrey Campbell MRN: [id number] DOB: 1932-08-05 Today's Date: 10/16/2016   History of Present Illness   Jeffrey Campbell is a 83 y.o. male with medical history significant of coronary artery disease, carotid artery stenosis, hypertension, hyperlipidemia, hypothyroidism, chronic kidney disease stage IV, admitted to ER from Dr. Jason Nest office due to CBC with Hgb of 6.  pt s/p EGD, transfused 2 units of blood.  Clinical Impression  Pt admitted with/for low Hgb for work up, s/p EGD.  Pt at min guard to supervision level for mobility and gait.  Pt currently limited functionally due to the problems listed below.  (see problems list.)  Pt will benefit from PT to maximize function and safety to be able to get home safely with available assist.     Follow Up Recommendations Home health PT;Supervision/Assistance - 24 hour    Equipment Recommendations  Other (comment)    Recommendations for Other Services       Precautions / Restrictions Precautions Precautions: Fall      Mobility  Bed Mobility Overal bed mobility: Needs Assistance Bed Mobility: Supine to Sit;Sit to Supine     Supine to sit: Supervision Sit to supine: Supervision      Transfers Overall transfer level: Needs assistance Equipment used: None Transfers: Sit to/from Stand Sit to Stand: Supervision            Ambulation/Gait Ambulation/Gait assistance: Min guard Ambulation Distance (Feet): 300 Feet Assistive device: None Gait Pattern/deviations: Step-through pattern   Gait velocity interpretation: at or above normal speed for age/gender General Gait Details: mildly unsteady at times without any overt LOB, pt drifting and stepping to side at times to maintain steady.  Stairs            Wheelchair Mobility    Modified Rankin (Stroke Patients Only)       Balance     Sitting balance-Leahy Scale: Good       Standing balance-Leahy Scale: Fair                               Pertinent Vitals/Pain Faces Pain Scale: No hurt    Home Living Family/patient expects to be discharged to:: Private residence Living Arrangements: Spouse/significant other Available Help at Discharge: Family;Available 24 hours/day Type of Home: House Home Access: Level entry     Home Layout: One level Home Equipment: Cane - single point;Hand held shower head;Shower seat      Prior Function Level of Independence: Independent         Comments: pt reports driving short distances, assists wife with housekeeping     Hand Dominance   Dominant Hand: Right    Extremity/Trunk Assessment   Upper Extremity Assessment Upper Extremity Assessment: Defer to OT evaluation    Lower Extremity Assessment Lower Extremity Assessment: Overall WFL for tasks assessed       Communication   Communication: No difficulties  Cognition Arousal/Alertness: Awake/alert Behavior During Therapy: Flat affect;WFL for tasks assessed/performed Overall Cognitive Status: No family/caregiver present to determine baseline cognitive functioning                                        General Comments      Exercises     Assessment/Plan    PT Assessment Patient needs continued PT services  PT Problem List  Decreased activity tolerance;Decreased balance;Decreased mobility;Cardiopulmonary status limiting activity       PT Treatment Interventions Gait training;Therapeutic activities;Functional mobility training;Stair training;Patient/family education;Balance training    PT Goals (Current goals can be found in the Care Plan section)  Acute Rehab PT Goals Patient Stated Goal: Not stated PT Goal Formulation: With patient Time For Goal Achievement: 10/23/16 Potential to Achieve Goals: Good    Frequency Min 3X/week   Barriers to discharge        Co-evaluation               AM-PAC PT "6 Clicks" Daily Activity  Outcome Measure Difficulty  turning over in bed (including adjusting bedclothes, sheets and blankets)?: None Difficulty moving from lying on back to sitting on the side of the bed? : None Difficulty sitting down on and standing up from a chair with arms (e.g., wheelchair, bedside commode, etc,.)?: None Help needed moving to and from a bed to chair (including a wheelchair)?: A Little Help needed walking in hospital room?: A Little Help needed climbing 3-5 steps with a railing? : A Little 6 Click Score: 21    End of Session   Activity Tolerance: Patient tolerated treatment well Patient left: in bed;with call bell/phone within reach;with bed alarm set Nurse Communication: Mobility status PT Visit Diagnosis: Unsteadiness on feet (R26.81);Other abnormalities of gait and mobility (R26.89)    Time: 1250-1316 PT Time Calculation (min) (ACUTE ONLY): 26 min   Charges:   PT Evaluation $PT Eval Moderate Complexity: 1 Procedure PT Treatments $Gait Training: 8-22 mins   PT G Codes:        11-09-16  Donnella Sham, PT [phone]  (pager)  Tessie Fass Eiley Mcginnity 2016/11/09, 1:29 PM

## 2016-10-16 NOTE — Transfer of Care (Signed)
Immediate Anesthesia Transfer of Care Note  Patient: Jeffrey Campbell  Procedure(s) Performed: Procedure(s): ESOPHAGOGASTRODUODENOSCOPY (EGD) WITH PROPOFOL (Left)  Patient Location: Endoscopy Unit  Anesthesia Type:MAC  Level of Consciousness: awake, alert  and patient cooperative  Airway & Oxygen Therapy: Patient Spontanous Breathing and Patient connected to nasal cannula oxygen  Post-op Assessment: Report given to RN and Post -op Vital signs reviewed and stable  Post vital signs: Reviewed and stable  Last Vitals:  Vitals:   10/16/16 1130 10/16/16 1154  BP: (!) 214/57 (!) 191/59  Pulse: (!) 50 (!) 53  Resp: 20 16  Temp: 36.6 C     Last Pain:  Vitals:   10/16/16 1130  TempSrc: Oral         Complications: No apparent anesthesia complications

## 2016-10-16 NOTE — H&P (View-Only) (Signed)
Spoke with rapid response and she aggress that patient is not appropriate for medsurg/ tele unit.  Vitals:   10/15/16 1724 10/15/16 1732  BP: (!) 198/100 (!) 194/72  Pulse: (!) 47   Resp: 16   Temp: 97.9 F (36.6 C)   hemoglobin of 6.1. One unit of blood in process with a second to follow.

## 2016-10-17 LAB — CBC
HEMATOCRIT: 23.4 % — AB (ref 39.0–52.0)
HEMOGLOBIN: 7.5 g/dL — AB (ref 13.0–17.0)
MCH: 30 pg (ref 26.0–34.0)
MCHC: 32.1 g/dL (ref 30.0–36.0)
MCV: 93.6 fL (ref 78.0–100.0)
Platelets: 230 10*3/uL (ref 150–400)
RBC: 2.5 MIL/uL — ABNORMAL LOW (ref 4.22–5.81)
RDW: 17.7 % — AB (ref 11.5–15.5)
WBC: 6.1 10*3/uL (ref 4.0–10.5)

## 2016-10-17 MED ORDER — FERROUS SULFATE 325 (65 FE) MG PO TABS: 325.0000 mg | ORAL_TABLET | Freq: Two times a day (BID) | ORAL | 0 refills | Status: DC

## 2016-10-17 MED ORDER — PANTOPRAZOLE SODIUM 40 MG PO TBEC: 40.0000 mg | DELAYED_RELEASE_TABLET | Freq: Every day | ORAL | 0 refills | Status: DC

## 2016-10-17 NOTE — Care Management Note (Signed)
Case Management Note  Patient Details  Name: Jeffrey Campbell MRN: [id number] Date of Birth: Apr 22, 1932  Subjective/Objective:  83 y.o. M to be discharged home with HHPT provided by [facility]. Brenton Grills). No other needs identified.                   Action/Plan:CM will sign off for now but will be available should additional discharge needs arise or disposition change.    Expected Discharge Date:  10/17/16               Expected Discharge Plan:  [employer]  In-House Referral:     Discharge planning Services  CM Consult  Post Acute Care Choice:  Home Health Choice offered to:  Patient  DME Arranged:    DME Agency:     HH Arranged:  PT [place]:  [place]  Status of Service:  Completed, signed off  If discussed at [place] of Stay Meetings, dates discussed:    Additional Comments:  Delrae Sawyers, RN 10/17/2016, 3:49 PM

## 2016-10-17 NOTE — Discharge Summary (Signed)
Physician Discharge Summary  Jeffrey Campbell OIT:[id number] DOB: [birth year] DOA: 10/15/2016  PCP: [name], MD  Admit date: 10/15/2016 Discharge date: 10/17/2016  Admitted From:home Disposition:home  Recommendations for Outpatient Follow-up:  1. Follow up with PCP and Nephrologist in 1-2 weeks 2. Please obtain BMP/CBC in one week  Home Health:yes Equipment/Devices:no Discharge Condition:stable CODE STATUS:full code Diet recommendation:heart healthy  Brief/Interim Summary: 83 y.o.malewith medical history significant of coronary artery disease, carotid artery stenosis, hypertension, hyperlipidemia, hypothyroidism, chronic kidney disease stage IV, admitted about a month ago for hypertensive urgency and encephalopathy sent to ER by patient's nephrologist for low Hb.  # Normocytic/ iron deficiency anemia due to multifactorial etiology in the setting of subacute GI bleed and chronic kidney disease -Patient with mild shortness of breath and generalized weakness on admission. Received 2 units of red blood cell transfusion. He feels better. Hemoglobin is stable at 7.5. Started oral iron. Recommended to follow-up with PCP to repeat labs in a week. He verbalized understanding.  #Possible subacute upper GI bleed: Patient with fecal occult blood test positive. -Underwent upper GI endoscopy with no sign of bleeding. GI recommended Protonix daily. Patient with no active GI bleed. No nausea vomiting or abdominal pain. Tolerating diet well.  #Hypertensive urgency: Fluctuation in blood pressure. Continue current medications. Avoid AV nodal blocker because of bradycardia. Recommended to monitor blood pressure home. He follows up with his nephrologist.  #Sinus bradycardia: This is chronic.  Avoid AV nodal agent. Asymptomatic  #Hypothyroidism: Continue Synthroid.  #Chronic kidney disease stage IV: Serum creatinine level better than before. Electrolytes acceptable. Patient follows up with his  nephrologist outpatient. Monitor BMP. Avoid nephrotoxins.  #History of renal artery stenosis: Follows up with interventional radiologist and nephrologist for further evaluation.  Patient is clinically stable. Hemoglobin is stable. No sign of bleeding. Denied headache, dizziness, nausea vomiting or chest pain. Evaluated by PT OT. Patient will be discharged home with home care services. I recommended patient to repeat CBC and BMP in a week either with PCP or with nephrologist. He verbalized understanding.  Discharge Diagnoses:  Active Problems:   GI bleeding   GI bleed    Discharge Instructions  Discharge Instructions    Call MD for:  difficulty breathing, headache or visual disturbances    Complete by:  As directed    Call MD for:  extreme fatigue    Complete by:  As directed    Call MD for:  hives    Complete by:  As directed    Call MD for:  persistant dizziness or light-headedness    Complete by:  As directed    Call MD for:  persistant nausea and vomiting    Complete by:  As directed    Call MD for:  severe uncontrolled pain    Complete by:  As directed    Call MD for:  temperature >100.4    Complete by:  As directed    Diet - low sodium heart healthy    Complete by:  As directed    Discharge instructions    Complete by:  As directed    Please check lab CBC and BMP with your PCP or with your nephrologist in a week.   Increase activity slowly    Complete by:  As directed      Allergies as of 10/17/2016      Reactions   Iodinated Diagnostic Agents Other (See Comments)   Right heart cath 07/2016 allegedly "shut down his kidneys" Patient has stage III  chronic kidney disease   Zocor [simvastatin] Other (See Comments)   Liver problems    Tricor [fenofibrate]       Budesonide-formoterol Fumarate Other (See Comments)   Dry mouth   Tiotropium Bromide Monohydrate Other (See Comments)   Dry mouth      Medication List    TAKE these medications   amLODipine 10 MG  tablet Commonly known as:  NORVASC Take 1 tablet (10 mg total) by mouth daily.   aspirin EC 81 MG tablet Take 81 mg by mouth daily.   atorvastatin 10 MG tablet Commonly known as:  LIPITOR Take 1 tablet (10 mg total) by mouth daily.   doxazosin 8 MG tablet Commonly known as:  CARDURA Take 1 tablet (8 mg total) by mouth daily.   feeding supplement (ENSURE ENLIVE) Liqd Take 237 mLs by mouth 2 (two) times daily between meals.   ferrous sulfate 325 (65 FE) MG tablet Take 1 tablet (325 mg total) by mouth 2 (two) times daily with a meal.   furosemide 40 MG tablet Commonly known as:  LASIX Take 1 tablet (40 mg total) by mouth daily as needed for fluid or edema.   hydrALAZINE 100 MG tablet Commonly known as:  APRESOLINE TAKE 1 TABLET (100 MG TOTAL) BY MOUTH 3 (THREE) TIMES DAILY. What changed:  how much to take  how to take this  when to take this  additional instructions  Another medication with the same name was removed. Continue taking this medication, and follow the directions you see here.   levothyroxine 137 MCG tablet Commonly known as:  SYNTHROID, LEVOTHROID Take 137 mcg by mouth daily before breakfast.   multivitamin with minerals Tabs tablet Take 1 tablet by mouth daily.   pantoprazole 40 MG tablet Commonly known as:  PROTONIX Take 1 tablet (40 mg total) by mouth daily.   potassium chloride 10 MEQ tablet Commonly known as:  K-DUR,KLOR-CON Take 1 tablet (10 mEq total) by mouth daily.   QUEtiapine 25 MG tablet Commonly known as:  SEROQUEL Take 1 tablet (25 mg total) by mouth at bedtime.   SIMBRINZA 1-0.2 % Susp Generic drug:  Brinzolamide-Brimonidine Place 1 drop into both eyes 2 (two) times daily.      Follow-up Information    [name], MD. Schedule an appointment as soon as possible for a visit in 1 week(s).   Specialty:  Family Medicine Contact information: [place] [address] [phone]        [name],  MD. Schedule an appointment as soon as possible for a visit in 2 week(s).   Specialty:  Nephrology Contact information: [place] [address] [phone]          Allergies  Allergen Reactions  . Iodinated Diagnostic Agents Other (See Comments)    Right heart cath 07/2016 allegedly "shut down his kidneys" Patient has stage III chronic kidney disease  . Zocor [Simvastatin] Other (See Comments)    Liver problems   . Tricor [Fenofibrate]        . Budesonide-Formoterol Fumarate Other (See Comments)    Dry mouth  . Tiotropium Bromide Monohydrate Other (See Comments)    Dry mouth    Consultations: GI  Procedures/Studies: Endoscopy  Subjective: Seen and examined at bedside. Reported doing well. Denied headache, dizziness, nausea, vomiting, chest pain, shortness of breath. No abdominal pain.  Discharge Exam: Vitals:   10/17/16 0605 10/17/16 0840  BP: (!) 178/76 (!) 162/68  Pulse: (!) 52 (!) 53  Resp:    Temp:     Vitals:   10/17/16 0300 10/17/16 0341 10/17/16 0605 10/17/16 0840  BP: (!) 193/164 (!) 164/78 (!) 178/76 (!) 162/68  Pulse: (!) 51  (!) 52 (!) 53  Resp: 17     Temp: 98.3 F (36.8 C)     TempSrc: Oral     SpO2: 96%     Weight:      Height:        General: Pt is alert, awake, not in acute distress Cardiovascular: RRR, S1/S2 +, no rubs, no gallops Respiratory: CTA bilaterally, no wheezing, no rhonchi Abdominal: Soft, NT, ND, bowel sounds + Extremities: no edema, no cyanosis    The results of significant diagnostics from this hospitalization (including imaging, microbiology, ancillary and laboratory) are listed below for reference.     Microbiology: No results found for this or any previous visit (from the past 240 hour(s)).   Labs: BNP (last 3 results) No results for input(s): BNP in the last 8760 hours. Basic Metabolic Panel:  Recent Labs Lab 10/15/16 1433 10/16/16 0233  NA 141 140  K 4.4 3.7  CL 112* 113*  CO2 21* 21*   GLUCOSE 142* 94  BUN 61* 57*  CREATININE 3.02* 2.82*  CALCIUM 9.1 8.7*   Liver Function Tests:  Recent Labs Lab 10/15/16 1433  AST 28  ALT 21  ALKPHOS 68  BILITOT 1.0  PROT 6.9  ALBUMIN 3.6   No results for input(s): LIPASE, AMYLASE in the last 168 hours. No results for input(s): AMMONIA in the last 168 hours. CBC:  Recent Labs Lab 10/15/16 1433 10/16/16 0233 10/17/16 0203  WBC 5.7 6.4 6.1  HGB 6.1* 7.3* 7.5*  HCT 19.6* 22.7* 23.4*  MCV 96.6 94.6 93.6  PLT 222 215 230   Cardiac Enzymes: No results for input(s): CKTOTAL, CKMB, CKMBINDEX, TROPONINI in the last 168 hours. BNP: Invalid input(s): POCBNP CBG: No results for input(s): GLUCAP in the last 168 hours. D-Dimer No results for input(s): DDIMER in the last 72 hours. Hgb A1c No results for input(s): HGBA1C in the last 72 hours. Lipid Profile No results for input(s): CHOL, HDL, LDLCALC, TRIG, CHOLHDL, LDLDIRECT in the last 72 hours. Thyroid function studies No results for input(s): TSH, T4TOTAL, T3FREE, THYROIDAB in the last 72 hours.  Invalid input(s): FREET3 Anemia work up  Recent Labs  10/16/16 0233  FERRITIN 74  TIBC 256  IRON 31*   Urinalysis    Component Value Date/Time   COLORURINE AMBER (A) 09/16/2016 1820   APPEARANCEUR TURBID (A) 09/16/2016 1820   LABSPEC 1.014 09/16/2016 1820   PHURINE 5.0 09/16/2016 1820   GLUCOSEU NEGATIVE 09/16/2016 1820   HGBUR NEGATIVE 09/16/2016 1820   BILIRUBINUR NEGATIVE 09/16/2016 1820   KETONESUR NEGATIVE 09/16/2016 1820   PROTEINUR 100 (A) 09/16/2016 1820   NITRITE NEGATIVE 09/16/2016 1820   LEUKOCYTESUR LARGE (A) 09/16/2016 1820   Sepsis Labs Invalid input(s): PROCALCITONIN,  WBC,  LACTICIDVEN Microbiology No results found for this or any previous visit (from the past 240 hour(s)).   Time coordinating discharge: 30 minutes  SIGNED:   Rosita Fire, MD  [facility] 10/17/2016, 10:59 AM  If 7PM-7AM, please contact  night-coverage [email or web address] Password [email or web address]

## 2016-10-17 NOTE — Progress Notes (Signed)
Pt has been discharged home. Home health set-up by case management to follow pt after discharge. IV and telemetry box removed. Pt received discharge instructions and 2 paper prescriptions. All questions were answered. Pt discharged with all of his belongings. Pt left the unit via wheelchair and was accompanied by this RN and pt's family.   Grant Fontana BSN, RN

## 2016-10-18 ENCOUNTER — Encounter (HOSPITAL_COMMUNITY): Payer: Self-pay | Admitting: Gastroenterology

## 2016-10-18 DIAGNOSIS — I701 Atherosclerosis of renal artery: Secondary | ICD-10-CM | POA: Diagnosis not present

## 2016-10-18 LAB — BASIC METABOLIC PANEL WITH GFR
BUN: 44 mg/dL — ABNORMAL HIGH (ref 7–25)
CALCIUM: 8.7 mg/dL (ref 8.6–10.3)
CO2: 22 mmol/L (ref 20–31)
Chloride: 108 mmol/L (ref 98–110)
Creat: 2.74 mg/dL — ABNORMAL HIGH (ref 0.70–1.11)
GFR, EST AFRICAN AMERICAN: 24 mL/min — AB (ref >=60)
GFR, EST NON AFRICAN AMERICAN: 21 mL/min — AB (ref >=60)
GLUCOSE: 89 mg/dL (ref 65–99)
POTASSIUM: 4 mmol/L (ref 3.5–5.3)
SODIUM: 138 mmol/L (ref 135–146)

## 2016-10-19 ENCOUNTER — Ambulatory Visit
Admission: RE | Admit: 2016-10-19 | Discharge: 2016-10-19 | Disposition: A | Discharge status: Home / Self Care | Payer: Medicare Other | Source: Ambulatory Visit | Attending: Interventional Radiology | Admitting: Interventional Radiology

## 2016-10-19 ENCOUNTER — Ambulatory Visit (HOSPITAL_COMMUNITY)
Admission: RE | Admit: 2016-10-19 | Discharge: 2016-10-19 | Disposition: A | Discharge status: Home / Self Care | Payer: Medicare Other | Source: Ambulatory Visit | Attending: Interventional Radiology | Admitting: Interventional Radiology

## 2016-10-19 DIAGNOSIS — I701 Atherosclerosis of renal artery: Secondary | ICD-10-CM

## 2016-10-19 DIAGNOSIS — Q271 Congenital renal artery stenosis: Secondary | ICD-10-CM | POA: Diagnosis not present

## 2016-10-19 DIAGNOSIS — N183 Chronic kidney disease, stage 3 (moderate): Secondary | ICD-10-CM | POA: Diagnosis not present

## 2016-10-19 HISTORY — PX: IR RADIOLOGIST EVAL & MGMT: IMG5224

## 2016-10-19 NOTE — Progress Notes (Signed)
Chief Complaint: Patient was seen in consultation today for  Chief Complaint  Patient presents with  . Advice Only    Consult for RAS   at the request of McCullough,Heath  Referring Physician(s): McCullough,Heath  History of Present Illness: Jeffrey Campbell is a 83 y.o. male with a history of stage III chronic kidney disease, diastolic heart dysfunction, pulmonary arterial hypertension, and systemic arterial hypertension.  In October 2016 he had a duplex renal artery ultrasound which demonstrated suspicion for bilateral greater than 60% renal artery stenoses.  Additionally, on prior ultrasound imaging his kidneys have been demonstrated to be echogenic and slightly small in size.   He was recently admitted to the hospital with labile hypertensive emergency and hypertensive encephalopathy. His hypertension was a difficult to control, and during his admission the addition of an ACE inhibitor led to a worsening of his renal function which lends clinical support to the presence of underlying renal artery stenosis. His creatinine peaked at 5.2 on 09/19/2016.    MRA performed 10/19/16 confirms severe bilateral renal artery stenoses.   He presents today for discussion of possible CO2 angiography and renal artery stent placement. He is currently feeling well and has no active complaints.   Past Medical History:  Diagnosis Date  . BPH (benign prostatic hyperplasia)   . CAD (coronary artery disease)   . Carotid artery stenosis    1-39% bilateral carotid stenosis by dopplers 2018  . CKD (chronic kidney disease), stage III   . Diastolic dysfunction   . Dilated aortic root ([place])    61mm by echo 02/2015  . Glaucoma   . Graves disease   . Heart murmur, systolic   . History of ETOH abuse   . History of PFTs 05/2010   moderate airflow obstruction w reduced DLCO by PFT   . Hyperlipemia   . Hypertension   . Hyperthyroidism 08/26/10   radioactive iodine therapy   . Mitral regurgitation echo  2015   mild  . Multiple thyroid nodules   . MVP (mitral valve prolapse) 11/2012   posterior MVP  . Organic impotence   . OSA (obstructive sleep apnea)    upper airway resistance syndrome with RDI 18/hr - not on CPAP due to insurance not covering  . PUD (peptic ulcer disease)   . Pulmonary hypertension ([place]) echo 2015   moderate PASP 31mmHg  . Shutdown renal    after cardiac cath  . Type 2 diabetes, diet controlled ([place])   . Upper airway resistance syndrome     Past Surgical History:  Procedure Laterality Date  . APPENDECTOMY    . CARDIAC CATHETERIZATION    . CARDIAC CATHETERIZATION N/A 02/17/2015   Procedure: Right Heart Cath;  Surgeon: Larey Dresser, MD;  Location: [facility];  Service: Cardiovascular;  Laterality: N/A;  . ESOPHAGOGASTRODUODENOSCOPY (EGD) WITH PROPOFOL Left 10/16/2016   Procedure: ESOPHAGOGASTRODUODENOSCOPY (EGD) WITH PROPOFOL;  Surgeon: Ronnette Juniper, MD;  Location: [facility];  Service: Gastroenterology;  Laterality: Left;  . heart catherization    . HERNIA REPAIR  10/2009  . RIGHT HEART CATH N/A 07/28/2016   Procedure: Right Heart Cath;  Surgeon: Larey Dresser, MD;  Location: [facility];  Service: Cardiovascular;  Laterality: N/A;    Allergies: Iodinated diagnostic agents; Zocor [simvastatin]; Tricor [fenofibrate]; Budesonide-formoterol fumarate; and Tiotropium bromide monohydrate  Medications: Prior to Admission medications   Medication Sig Start Date End Date Taking? Authorizing Provider  amLODipine (NORVASC) 10 MG tablet Take 1 tablet (  10 mg total) by mouth daily. 09/24/16 09/24/17  Dhungel, Flonnie Overman, MD  aspirin EC 81 MG tablet Take 81 mg by mouth daily.    [provider]  atorvastatin (LIPITOR) 10 MG tablet Take 1 tablet (10 mg total) by mouth daily. 09/24/16 09/24/17  Dhungel, Nishant, MD  Brinzolamide-Brimonidine (SIMBRINZA) 1-0.2 % SUSP Place 1 drop into both eyes 2 (two) times daily.     [provider]  doxazosin  (CARDURA) 8 MG tablet Take 1 tablet (8 mg total) by mouth daily. 09/24/16   Dhungel, Nishant, MD  feeding supplement, ENSURE ENLIVE, (ENSURE ENLIVE) LIQD Take 237 mLs by mouth 2 (two) times daily between meals. 09/24/16   Dhungel, Flonnie Overman, MD  ferrous sulfate 325 (65 FE) MG tablet Take 1 tablet (325 mg total) by mouth 2 (two) times daily with a meal. 10/17/16   Rosita Fire, MD  furosemide (LASIX) 40 MG tablet Take 1 tablet (40 mg total) by mouth daily as needed for fluid or edema. 09/24/16   Dhungel, Nishant, MD  hydrALAZINE (APRESOLINE) 100 MG tablet TAKE 1 TABLET (100 MG TOTAL) BY MOUTH 3 (THREE) TIMES DAILY. Patient taking differently: Take 100 mg by mouth 3 (three) times daily.  08/24/16   Sueanne Margarita, MD  levothyroxine (SYNTHROID, LEVOTHROID) 137 MCG tablet Take 137 mcg by mouth daily before breakfast.  06/28/16   [provider]  Multiple Vitamin (MULTIVITAMIN WITH MINERALS) TABS tablet Take 1 tablet by mouth daily.    [provider]  pantoprazole (PROTONIX) 40 MG tablet Take 1 tablet (40 mg total) by mouth daily. 10/18/16   Rosita Fire, MD  potassium chloride (K-DUR,KLOR-CON) 10 MEQ tablet Take 1 tablet (10 mEq total) by mouth daily. 09/19/15   Sueanne Margarita, MD  QUEtiapine (SEROQUEL) 25 MG tablet Take 1 tablet (25 mg total) by mouth at bedtime. 09/24/16   Dhungel, Flonnie Overman, MD     Family History  Problem Relation Age of Onset  . Kidney failure Father   . Hypertension Father   . Colon cancer Mother   . Colon cancer Brother   . Lung disease Brother   . Colon cancer Maternal Uncle     Social History   Social History  . Marital status: Married    Spouse name: N/A  . Number of children: 4  . Years of education: N/A   Occupational History  . Retired Retired    [employer]   Social History Main Topics  . Smoking status: Former Smoker    Packs/day: 2.00    Years: 40.00    Types: Cigarettes    Quit date: 03/29/1984  . Smokeless tobacco:  Never Used  . Alcohol use No     Comment: quit in 1981  . Drug use: No  . Sexual activity: Not on file   Other Topics Concern  . Not on file   Social History Narrative  . No narrative on file    Review of Systems: A 12 point ROS discussed and pertinent positives are indicated in the HPI above.  All other systems are negative.  Review of Systems  Vital Signs: BP (!) 167/68   Pulse (!) 55   Temp 97.9 F (36.6 C) (Oral)   Resp 14   Ht 5\' 9"  (1.753 m)   Wt 145 lb (65.8 kg)   SpO2 98%   BMI 21.41 kg/m   Physical Exam  Constitutional: He is oriented to person, place, and time. He appears well-developed and well-nourished.  No distress.  HENT:  Head: Normocephalic and atraumatic.  Eyes: No scleral icterus.  Cardiovascular: Normal rate.   Pulmonary/Chest: Effort normal.  Abdominal: Soft. He exhibits no distension. There is no tenderness.  Neurological: He is alert and oriented to person, place, and time.  Skin: Skin is warm and dry.  Psychiatric: He has a normal mood and affect. His behavior is normal.  Nursing note and vitals reviewed.   Imaging: Dg Chest Port 1 View  Result Date: 09/20/2016 CLINICAL DATA:  83 year old with chronic kidney disease. Possible volume overload. EXAM: PORTABLE CHEST 1 VIEW COMPARISON:  09/06/2016, 09/04/2016 and earlier, including CT chest 05/12/2016 and earlier. FINDINGS: Cardiac silhouette moderately to markedly enlarged, unchanged. Thoracic aorta atherosclerotic, unchanged. Hilar and mediastinal contours otherwise unremarkable. Pulmonary vascularity normal without evidence of pulmonary edema. Atelectasis involving the left lung base. Lungs otherwise clear. IMPRESSION: Atelectasis involving the left lung base. No acute cardiopulmonary disease otherwise. Stable cardiomegaly without pulmonary edema. Electronically Signed   By: Evangeline Dakin M.D.   On: 09/20/2016 11:27    Labs:  CBC:  Recent Labs  09/19/16 0407 10/15/16 1433 10/16/16 0233  10/17/16 0203  WBC 8.5 5.7 6.4 6.1  HGB 8.8* 6.1* 7.3* 7.5*  HCT 26.7* 19.6* 22.7* 23.4*  PLT 260 222 215 230    COAGS:  Recent Labs  07/21/16 1037 10/15/16 1433  INR 1.1 1.00    BMP:  Recent Labs  09/24/16 0420 10/15/16 1433 10/16/16 0233 10/18/16 0819  NA 140 141 140 138  K 4.2 4.4 3.7 4.0  CL 106 112* 113* 108  CO2 25 21* 21* 22  GLUCOSE 96 142* 94 89  BUN 103* 61* 57* 44*  CALCIUM 9.1 9.1 8.7* 8.7  CREATININE 3.77* 3.02* 2.82* 2.74*  GFRNONAA 14* 18* 20* 21*  GFRAA 16* 21* 23* 24*    LIVER FUNCTION TESTS:  Recent Labs  01/20/16 0909 07/06/16 0854 10/15/16 1433  BILITOT 1.2 0.9 1.0  AST 18 17 28   ALT 13 14 21   ALKPHOS 57 70 68  PROT 6.5 6.8 6.9  ALBUMIN 4.0 4.2 3.6    TUMOR MARKERS: No results for input(s): AFPTM, CEA, CA199, CHROMGRNA in the last 8760 hours.  Assessment and Plan:  Jeffrey Campbell does have hemodynamically significant stenoses of both renal arteries on his recent MRA.  His renal function has slowly improved.   We discussed the risks, benefits and alternatives to CO2 angiogram and renal artery stenting.  He understands that treating his RAS would likely make his hypertension more manageable and less volatile.   His renal function may be improved or worsened by the procedure.    AFter discussing with his family, he has decided to take a wait and see approach.  His HTN has been better managed since his last admission and he is reticent to undergo any invasive procedure at his age.  He states he would consider undergoing the procedure if his HTN becomes labile again.   1.) Return to clinic in 3 months with repeat BMP   Electronically Signed: Jacqulynn Cadet 10/19/2016, 5:29 PM   I spent a total of  15 Minutes in face to face in clinical consultation, greater than 50% of which was counseling/coordinating care for renal artery stenosis.

## 2016-10-20 DIAGNOSIS — N184 Chronic kidney disease, stage 4 (severe): Secondary | ICD-10-CM | POA: Diagnosis not present

## 2016-10-20 DIAGNOSIS — I150 Renovascular hypertension: Secondary | ICD-10-CM | POA: Diagnosis not present

## 2016-10-20 DIAGNOSIS — Z87440 Personal history of urinary (tract) infections: Secondary | ICD-10-CM | POA: Diagnosis not present

## 2016-10-20 DIAGNOSIS — G4733 Obstructive sleep apnea (adult) (pediatric): Secondary | ICD-10-CM | POA: Diagnosis not present

## 2016-10-20 DIAGNOSIS — J441 Chronic obstructive pulmonary disease with (acute) exacerbation: Secondary | ICD-10-CM | POA: Diagnosis not present

## 2016-10-20 DIAGNOSIS — I2510 Atherosclerotic heart disease of native coronary artery without angina pectoris: Secondary | ICD-10-CM | POA: Diagnosis not present

## 2016-10-20 DIAGNOSIS — E1122 Type 2 diabetes mellitus with diabetic chronic kidney disease: Secondary | ICD-10-CM | POA: Diagnosis not present

## 2016-10-20 DIAGNOSIS — Z7982 Long term (current) use of aspirin: Secondary | ICD-10-CM | POA: Diagnosis not present

## 2016-10-20 DIAGNOSIS — F0150 Vascular dementia without behavioral disturbance: Secondary | ICD-10-CM | POA: Diagnosis not present

## 2016-10-20 DIAGNOSIS — I69818 Other symptoms and signs involving cognitive functions following other cerebrovascular disease: Secondary | ICD-10-CM | POA: Diagnosis not present

## 2016-10-20 DIAGNOSIS — I5032 Chronic diastolic (congestive) heart failure: Secondary | ICD-10-CM | POA: Diagnosis not present

## 2016-10-20 DIAGNOSIS — D631 Anemia in chronic kidney disease: Secondary | ICD-10-CM | POA: Diagnosis not present

## 2016-10-20 DIAGNOSIS — I701 Atherosclerosis of renal artery: Secondary | ICD-10-CM | POA: Diagnosis not present

## 2016-10-20 DIAGNOSIS — F329 Major depressive disorder, single episode, unspecified: Secondary | ICD-10-CM | POA: Diagnosis not present

## 2016-10-20 DIAGNOSIS — I272 Pulmonary hypertension, unspecified: Secondary | ICD-10-CM | POA: Diagnosis not present

## 2016-10-22 DIAGNOSIS — N183 Chronic kidney disease, stage 3 (moderate): Secondary | ICD-10-CM | POA: Diagnosis not present

## 2016-10-22 DIAGNOSIS — D509 Iron deficiency anemia, unspecified: Secondary | ICD-10-CM | POA: Diagnosis not present

## 2016-10-22 DIAGNOSIS — R609 Edema, unspecified: Secondary | ICD-10-CM | POA: Diagnosis not present

## 2016-10-22 DIAGNOSIS — Z8719 Personal history of other diseases of the digestive system: Secondary | ICD-10-CM | POA: Diagnosis not present

## 2016-10-22 DIAGNOSIS — E890 Postprocedural hypothyroidism: Secondary | ICD-10-CM | POA: Diagnosis not present

## 2016-10-22 DIAGNOSIS — R600 Localized edema: Secondary | ICD-10-CM | POA: Diagnosis not present

## 2016-10-26 DIAGNOSIS — I5032 Chronic diastolic (congestive) heart failure: Secondary | ICD-10-CM | POA: Diagnosis not present

## 2016-10-26 DIAGNOSIS — I701 Atherosclerosis of renal artery: Secondary | ICD-10-CM | POA: Diagnosis not present

## 2016-10-26 DIAGNOSIS — E1122 Type 2 diabetes mellitus with diabetic chronic kidney disease: Secondary | ICD-10-CM | POA: Diagnosis not present

## 2016-10-26 DIAGNOSIS — I150 Renovascular hypertension: Secondary | ICD-10-CM | POA: Diagnosis not present

## 2016-10-26 DIAGNOSIS — N184 Chronic kidney disease, stage 4 (severe): Secondary | ICD-10-CM | POA: Diagnosis not present

## 2016-10-26 DIAGNOSIS — I2510 Atherosclerotic heart disease of native coronary artery without angina pectoris: Secondary | ICD-10-CM | POA: Diagnosis not present

## 2016-10-28 DIAGNOSIS — E1122 Type 2 diabetes mellitus with diabetic chronic kidney disease: Secondary | ICD-10-CM | POA: Diagnosis not present

## 2016-10-28 DIAGNOSIS — I150 Renovascular hypertension: Secondary | ICD-10-CM | POA: Diagnosis not present

## 2016-10-28 DIAGNOSIS — I701 Atherosclerosis of renal artery: Secondary | ICD-10-CM | POA: Diagnosis not present

## 2016-10-28 DIAGNOSIS — N184 Chronic kidney disease, stage 4 (severe): Secondary | ICD-10-CM | POA: Diagnosis not present

## 2016-10-28 DIAGNOSIS — I2510 Atherosclerotic heart disease of native coronary artery without angina pectoris: Secondary | ICD-10-CM | POA: Diagnosis not present

## 2016-10-28 DIAGNOSIS — I5032 Chronic diastolic (congestive) heart failure: Secondary | ICD-10-CM | POA: Diagnosis not present

## 2016-10-29 DIAGNOSIS — I2510 Atherosclerotic heart disease of native coronary artery without angina pectoris: Secondary | ICD-10-CM | POA: Diagnosis not present

## 2016-10-29 DIAGNOSIS — N184 Chronic kidney disease, stage 4 (severe): Secondary | ICD-10-CM | POA: Diagnosis not present

## 2016-10-29 DIAGNOSIS — I150 Renovascular hypertension: Secondary | ICD-10-CM | POA: Diagnosis not present

## 2016-10-29 DIAGNOSIS — I701 Atherosclerosis of renal artery: Secondary | ICD-10-CM | POA: Diagnosis not present

## 2016-10-29 DIAGNOSIS — E1122 Type 2 diabetes mellitus with diabetic chronic kidney disease: Secondary | ICD-10-CM | POA: Diagnosis not present

## 2016-10-29 DIAGNOSIS — I5032 Chronic diastolic (congestive) heart failure: Secondary | ICD-10-CM | POA: Diagnosis not present

## 2016-10-31 ENCOUNTER — Other Ambulatory Visit: Payer: Self-pay | Admitting: Cardiovascular Disease

## 2016-11-01 ENCOUNTER — Other Ambulatory Visit: Payer: Self-pay | Admitting: Cardiology

## 2016-11-01 DIAGNOSIS — I2510 Atherosclerotic heart disease of native coronary artery without angina pectoris: Secondary | ICD-10-CM | POA: Diagnosis not present

## 2016-11-01 DIAGNOSIS — I150 Renovascular hypertension: Secondary | ICD-10-CM | POA: Diagnosis not present

## 2016-11-01 DIAGNOSIS — E1122 Type 2 diabetes mellitus with diabetic chronic kidney disease: Secondary | ICD-10-CM | POA: Diagnosis not present

## 2016-11-01 DIAGNOSIS — I701 Atherosclerosis of renal artery: Secondary | ICD-10-CM | POA: Diagnosis not present

## 2016-11-01 DIAGNOSIS — I5032 Chronic diastolic (congestive) heart failure: Secondary | ICD-10-CM | POA: Diagnosis not present

## 2016-11-01 DIAGNOSIS — N184 Chronic kidney disease, stage 4 (severe): Secondary | ICD-10-CM | POA: Diagnosis not present

## 2016-11-01 NOTE — Telephone Encounter (Signed)
Please review for refill. Thanks!  

## 2016-11-02 DIAGNOSIS — D649 Anemia, unspecified: Secondary | ICD-10-CM | POA: Diagnosis not present

## 2016-11-03 ENCOUNTER — Encounter (HOSPITAL_COMMUNITY): Payer: Self-pay | Admitting: Cardiology

## 2016-11-03 ENCOUNTER — Ambulatory Visit (HOSPITAL_COMMUNITY)
Admission: RE | Admit: 2016-11-03 | Discharge: 2016-11-03 | Disposition: A | Discharge status: Home / Self Care | Payer: Medicare Other | Source: Ambulatory Visit | Attending: Cardiology | Admitting: Cardiology

## 2016-11-03 VITALS — BP 152/64 | HR 44 | Wt 138.5 lb

## 2016-11-03 DIAGNOSIS — I129 Hypertensive chronic kidney disease with stage 1 through stage 4 chronic kidney disease, or unspecified chronic kidney disease: Secondary | ICD-10-CM | POA: Diagnosis not present

## 2016-11-03 DIAGNOSIS — N400 Enlarged prostate without lower urinary tract symptoms: Secondary | ICD-10-CM | POA: Diagnosis not present

## 2016-11-03 DIAGNOSIS — Z9889 Other specified postprocedural states: Secondary | ICD-10-CM | POA: Diagnosis not present

## 2016-11-03 DIAGNOSIS — Z841 Family history of disorders of kidney and ureter: Secondary | ICD-10-CM | POA: Diagnosis not present

## 2016-11-03 DIAGNOSIS — E039 Hypothyroidism, unspecified: Secondary | ICD-10-CM | POA: Diagnosis not present

## 2016-11-03 DIAGNOSIS — I2510 Atherosclerotic heart disease of native coronary artery without angina pectoris: Secondary | ICD-10-CM | POA: Diagnosis not present

## 2016-11-03 DIAGNOSIS — I272 Pulmonary hypertension, unspecified: Secondary | ICD-10-CM

## 2016-11-03 DIAGNOSIS — Z8719 Personal history of other diseases of the digestive system: Secondary | ICD-10-CM | POA: Diagnosis not present

## 2016-11-03 DIAGNOSIS — I5032 Chronic diastolic (congestive) heart failure: Secondary | ICD-10-CM | POA: Diagnosis not present

## 2016-11-03 DIAGNOSIS — Z79899 Other long term (current) drug therapy: Secondary | ICD-10-CM | POA: Diagnosis not present

## 2016-11-03 DIAGNOSIS — Z8249 Family history of ischemic heart disease and other diseases of the circulatory system: Secondary | ICD-10-CM | POA: Insufficient documentation

## 2016-11-03 DIAGNOSIS — E785 Hyperlipidemia, unspecified: Secondary | ICD-10-CM | POA: Diagnosis not present

## 2016-11-03 DIAGNOSIS — I341 Nonrheumatic mitral (valve) prolapse: Secondary | ICD-10-CM | POA: Insufficient documentation

## 2016-11-03 DIAGNOSIS — I350 Nonrheumatic aortic (valve) stenosis: Secondary | ICD-10-CM | POA: Diagnosis not present

## 2016-11-03 DIAGNOSIS — K551 Chronic vascular disorders of intestine: Secondary | ICD-10-CM | POA: Insufficient documentation

## 2016-11-03 DIAGNOSIS — N184 Chronic kidney disease, stage 4 (severe): Secondary | ICD-10-CM | POA: Diagnosis not present

## 2016-11-03 DIAGNOSIS — E1122 Type 2 diabetes mellitus with diabetic chronic kidney disease: Secondary | ICD-10-CM | POA: Insufficient documentation

## 2016-11-03 DIAGNOSIS — R001 Bradycardia, unspecified: Secondary | ICD-10-CM | POA: Insufficient documentation

## 2016-11-03 DIAGNOSIS — Z8711 Personal history of peptic ulcer disease: Secondary | ICD-10-CM | POA: Diagnosis not present

## 2016-11-03 DIAGNOSIS — Z7982 Long term (current) use of aspirin: Secondary | ICD-10-CM | POA: Diagnosis not present

## 2016-11-03 DIAGNOSIS — N183 Chronic kidney disease, stage 3 (moderate): Secondary | ICD-10-CM | POA: Diagnosis not present

## 2016-11-03 DIAGNOSIS — D631 Anemia in chronic kidney disease: Secondary | ICD-10-CM | POA: Diagnosis not present

## 2016-11-03 DIAGNOSIS — G4733 Obstructive sleep apnea (adult) (pediatric): Secondary | ICD-10-CM | POA: Diagnosis not present

## 2016-11-03 DIAGNOSIS — E0500 Thyrotoxicosis with diffuse goiter without thyrotoxic crisis or storm: Secondary | ICD-10-CM | POA: Insufficient documentation

## 2016-11-03 DIAGNOSIS — I130 Hypertensive heart and chronic kidney disease with heart failure and stage 1 through stage 4 chronic kidney disease, or unspecified chronic kidney disease: Secondary | ICD-10-CM | POA: Diagnosis not present

## 2016-11-03 DIAGNOSIS — I701 Atherosclerosis of renal artery: Secondary | ICD-10-CM | POA: Insufficient documentation

## 2016-11-03 DIAGNOSIS — N2581 Secondary hyperparathyroidism of renal origin: Secondary | ICD-10-CM | POA: Diagnosis not present

## 2016-11-03 LAB — BASIC METABOLIC PANEL
ANION GAP: 11 (ref 5–15)
BUN: 62 mg/dL — AB (ref 6–20)
CALCIUM: 9.6 mg/dL (ref 8.9–10.3)
CO2: 25 mmol/L (ref 22–32)
CREATININE: 3.14 mg/dL — AB (ref 0.61–1.24)
Chloride: 103 mmol/L (ref 101–111)
GFR calc Af Amer: 20 mL/min — ABNORMAL LOW (ref >60)
GFR, EST NON AFRICAN AMERICAN: 17 mL/min — AB (ref >60)
GLUCOSE: 184 mg/dL — AB (ref 65–99)
Potassium: 4.2 mmol/L (ref 3.5–5.1)
Sodium: 139 mmol/L (ref 135–145)

## 2016-11-03 LAB — CBC
HEMATOCRIT: 28.5 % — AB (ref 39.0–52.0)
HEMOGLOBIN: 9 g/dL — AB (ref 13.0–17.0)
MCH: 30.1 pg (ref 26.0–34.0)
MCHC: 31.6 g/dL (ref 30.0–36.0)
MCV: 95.3 fL (ref 78.0–100.0)
Platelets: 202 10*3/uL (ref 150–400)
RBC: 2.99 MIL/uL — ABNORMAL LOW (ref 4.22–5.81)
RDW: 16 % — ABNORMAL HIGH (ref 11.5–15.5)
WBC: 7 10*3/uL (ref 4.0–10.5)

## 2016-11-03 MED ORDER — POTASSIUM CHLORIDE CRYS ER 10 MEQ PO TBCR: 10.0000 meq | EXTENDED_RELEASE_TABLET | Freq: Every day | ORAL | 2 refills | Status: DC

## 2016-11-03 MED ORDER — FUROSEMIDE 40 MG PO TABS: 40.0000 mg | ORAL_TABLET | ORAL | 3 refills | Status: DC

## 2016-11-03 NOTE — Patient Instructions (Signed)
Labs today (will call for abnormal results, otherwise no news is good news)  START taking lasix 40 mg every other day.  Keep your follow up appointment with Dr. Radford Pax and follow up with our office as needed.

## 2016-11-05 NOTE — Progress Notes (Signed)
Patient ID: Jeffrey Campbell, male   DOB: Apr 04, 1932, 83 y.o.   MRN: [id number] PCP: Dr. Drema Dallas Cardiology: Dr Radford Pax HF Cardiology: Dr. Aundra Dubin  83 yo with history of CAD, PAD, CKD, sinus bradycardia, and pulmonary HTN by echo presents for CHF clinic followup.  Patient had an echo in 10/16 showing normal LV EF, but moderately dilated RV with PASP 68 mmHg.  He had an extensive workup for pulmonary hypertension. PFTs showed mild obstruction but moderate to severely decreased DLCO.  V/Q scan was negative for evidence of acute or chronic PE.  He had had a CT chest back in 2/16 that showed no definitive evidence for interstitial lung disease.  [place] in 11/16 showed pulmonary venous hypertension and Lasix was started at 20 mg daily.   Patient is bradycardic with HR in 40s.  This seems to be asymptomatic and is chronic. HR 44 today.   He had rather diffuse moderate CAD on LHC in 2012 but no intervention.  Cardiolite in 4/16 showed no ischemia or infarction.  He has bilateral renal artery stenosis by renal artery dopplers in 10/16.  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  No claudication or intestinal angina.  It has been decided not to intervene on his renal arteries.   He had repeat echo in 51/02 with PA systolic pressure 77 mmHg, normal EF and evidence for worsening diastolic function.  RHC was done in 5/18, again showing primarily pulmonary venous hypertension.  Most recent echo in 6/18 showed PASP 46 mmHg with EF 60-65%.    Currently, he denies dyspnea walking on flat ground.  No lightheadedness or syncope despite chronic bradycardia.  BP is mildly elevated.  No chest pain.  No orthopnea/PND.  Generally feels pretty good.  Says that the renal doctor told him to take Lasix only prn.    He had a recent workup for GI bleeding, EGD was negative.   ECG (personally reviewed): sinus brady at 41, 1st degree AVB 220 msec  Labs (4/16): LDL 70 Labs (10/16): BNP 621, K 3.9,  creatinine 2.51 Labs (11/16): K 4.2, creatinine 2.49 Labs (7/18): K 3.7, creatinine 2.82, hgb 7.5  PMH: 1. CAD: LHC (2012) with 50-60% ostial LAD, 50-60% mLAD, 60% pRCA.  Cardiolite (4/16) with EF 58%, no ischemia or infarction.  2. [place] 3. HTN 4. Hyperlipidemia 5. Graves disease treated with RAI, now hypothyroid. 6. PUD 7. CKD III-IV 8. Type II diabetes 9. OSA: Not using CPAP 10. Sinus bradycardia: Holter (10/16) with average HR 44, occasional junctional rhythm.  11. Mitral valve prolapse: Moderate MR on 12/17 echo, minimal reported on 6/18 echo.  12. Chronic diastolic CHF: Echo (58/52) with EF 60-65%, grade II diastolic dysfunction, mild AI, MVP with mild MR, moderate RV dilation with normal systolic function, PASP 68 mmHg.   - Echo (12/17): EF 60-65%, moderate MR, grade III diastolic dysfunction, PASP 77 mmHg. - Echo (6/18): EF 60-65%, PASP 46 mmHg.  13. 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  14. Pulmonary hypertension: PASP 68 mmHg on 10/16 echo.  PFTs (11/16) with FEV1 76%, FVC 78%, ratio 95%, TLC 73%, DLCO 48% => mild obstruction with moderate to severely decreased DLCO.  CT chest (2/16) without definitive interstitial lung disease.  V/Q scan 11/16 with no perfusion defects.  RHC (11/16) with mean RA 5 (v-waves to 18), PA 45/9 mean 24, PCWP mean 13 (v-waves to 33),  CI 3.42, PVR 1.8 WU.  - RHC (5/18): mean RA 8, PA 68/12 mean 33, mean PCWP 21, CI 3.91, PVR 1.74 WU 15. Carotid dopplers (10/16) with mild disease.   SH: Married, lives in [place], retired Scientist, research (medical), heavy smoker previously but quit in 1980s.    FH: HTN, CKD.  ROS: All systems reviewed and negative except as per HPI.   Current Outpatient Prescriptions  Medication Sig Dispense Refill  . amLODipine (NORVASC) 10 MG tablet Take 1 tablet (10 mg total) by mouth daily. 30 tablet 0  . aspirin EC 81 MG tablet Take 81 mg  by mouth daily.    Marland Kitchen atorvastatin (LIPITOR) 10 MG tablet Take 1 tablet (10 mg total) by mouth daily. 30 tablet 0  . doxazosin (CARDURA) 8 MG tablet Take 1 tablet (8 mg total) by mouth daily. 30 tablet 0  . feeding supplement, ENSURE ENLIVE, (ENSURE ENLIVE) LIQD Take 237 mLs by mouth 2 (two) times daily between meals. 237 mL 12  . ferrous sulfate 325 (65 FE) MG tablet Take 1 tablet (325 mg total) by mouth 2 (two) times daily with a meal. 60 tablet 0  . furosemide (LASIX) 40 MG tablet Take 1 tablet (40 mg total) by mouth every other day. 20 tablet 3  . hydrALAZINE (APRESOLINE) 100 MG tablet TAKE 1 TABLET (100 MG TOTAL) BY MOUTH 3 (THREE) TIMES DAILY. (Patient taking differently: Take 100 mg by mouth 3 (three) times daily. ) 270 tablet 3  . levothyroxine (SYNTHROID, LEVOTHROID) 137 MCG tablet Take 137 mcg by mouth daily before breakfast.     . Multiple Vitamin (MULTIVITAMIN WITH MINERALS) TABS tablet Take 1 tablet by mouth daily.    . pantoprazole (PROTONIX) 40 MG tablet Take 1 tablet (40 mg total) by mouth daily. 30 tablet 0  . potassium chloride (KLOR-CON M10) 10 MEQ tablet Take 1 tablet (10 mEq total) by mouth daily. 90 tablet 2  . QUEtiapine (SEROQUEL) 25 MG tablet Take 1 tablet (25 mg total) by mouth at bedtime. 15 tablet 0  . Brinzolamide-Brimonidine (SIMBRINZA) 1-0.2 % SUSP Place 1 drop into both eyes 2 (two) times daily.      No current facility-administered medications for this encounter.    BP (!) 152/64   Pulse (!) 44   Wt 138 lb 8 oz (62.8 kg)   SpO2 100%   BMI 20.45 kg/m  General: NAD Neck: JVP 8 cm, no thyromegaly or thyroid nodule.  Lungs: Clear to auscultation bilaterally with normal respiratory effort. CV: Nondisplaced PMI.  Heart regular S1/S2, no S3/S4, 1/6 SEM RUSB.  No peripheral edema.  No carotid bruit. Weak pedal pulses.  Abdomen: Soft, nontender, no hepatosplenomegaly, no distention.  Skin: Intact without lesions or rashes.  Neurologic: Alert and oriented x 3.   Psych: Normal affect. Extremities: No clubbing or cyanosis.  HEENT: Normal.   Assessment/Plan: 1. Pulmonary hypertension: Noted by echo in 2016 with PASP 68 mmHg and moderately dilated RV.  PFTs showed mild obstruction with moderate to severely decreased DLCO, suggesting a pulmonary vascular problem.  CT chest in 2/16 did not show definitive ILD.  V/Q scan did not show evidence for acute or chronic PE.  He carries a history of OSA but is not using CPAP.  RHC showed mild pulmonary venous hypertension, PVR was only 1.8 WU.   He had echo in 12/17 with PASP 77 mmHg but RHC again in 5/18 showed pulmonary venous hypertension.  Suspect he has significant diastolic CHF.  There is no  specific treatment beyond diuresis.  Interestingly, most recent echo in 6/18 showed lower PA pressure.  2. Chronic diastolic CHF: Patient has diastolic LV failure with pulmonary venous HTN.  CKD stage III-IV increases tendency to retain fluid.  - Currently taking Lasix only prn.  Would recommend he take Lasix at least every other day (40 mg qod).    3. CKD: Stage III-IV.  Closely followed by Dr. Justin Mend.  Fairly stable recently.   4. HTN: Suspect bilateral renal artery stenosis plays a role.  Mild HTN today.   - Based on review of notes it appears that intervention on renal arteries has been decided against.  5. PAD: Renal artery stenosis, mesenteric artery stenosis, distal aortic stenosis. - Continue statin and ASA.  6. CAD: Stable with no chest pain.  Continue ASA 81 and statin. Goal LDL < 70.  4/16 Cardiolite with no ischemia.  7. Sinus bradycardia:  Asymptomatic.  Follow for now, low threshold for PPM if develops concerning symptoms.    He has multiple MDs that he sees and it appears that renal is primarily dosing his diuretics.  To avoid confusion, I will send him back to Dr. Radford Pax.   Loralie Champagne 11/05/2016

## 2016-11-08 DIAGNOSIS — H401113 Primary open-angle glaucoma, right eye, severe stage: Secondary | ICD-10-CM | POA: Diagnosis not present

## 2016-11-08 DIAGNOSIS — H401121 Primary open-angle glaucoma, left eye, mild stage: Secondary | ICD-10-CM | POA: Diagnosis not present

## 2016-11-10 DIAGNOSIS — I701 Atherosclerosis of renal artery: Secondary | ICD-10-CM | POA: Diagnosis not present

## 2016-11-10 DIAGNOSIS — I5032 Chronic diastolic (congestive) heart failure: Secondary | ICD-10-CM | POA: Diagnosis not present

## 2016-11-10 DIAGNOSIS — I150 Renovascular hypertension: Secondary | ICD-10-CM | POA: Diagnosis not present

## 2016-11-10 DIAGNOSIS — E1122 Type 2 diabetes mellitus with diabetic chronic kidney disease: Secondary | ICD-10-CM | POA: Diagnosis not present

## 2016-11-10 DIAGNOSIS — N184 Chronic kidney disease, stage 4 (severe): Secondary | ICD-10-CM | POA: Diagnosis not present

## 2016-11-10 DIAGNOSIS — I2510 Atherosclerotic heart disease of native coronary artery without angina pectoris: Secondary | ICD-10-CM | POA: Diagnosis not present

## 2016-11-18 ENCOUNTER — Telehealth: Payer: Self-pay | Admitting: Cardiology

## 2016-11-18 DIAGNOSIS — I150 Renovascular hypertension: Secondary | ICD-10-CM | POA: Diagnosis not present

## 2016-11-18 DIAGNOSIS — I2510 Atherosclerotic heart disease of native coronary artery without angina pectoris: Secondary | ICD-10-CM | POA: Diagnosis not present

## 2016-11-18 DIAGNOSIS — I701 Atherosclerosis of renal artery: Secondary | ICD-10-CM | POA: Diagnosis not present

## 2016-11-18 DIAGNOSIS — I5032 Chronic diastolic (congestive) heart failure: Secondary | ICD-10-CM | POA: Diagnosis not present

## 2016-11-18 DIAGNOSIS — N184 Chronic kidney disease, stage 4 (severe): Secondary | ICD-10-CM | POA: Diagnosis not present

## 2016-11-18 DIAGNOSIS — E1122 Type 2 diabetes mellitus with diabetic chronic kidney disease: Secondary | ICD-10-CM | POA: Diagnosis not present

## 2016-11-18 NOTE — Telephone Encounter (Signed)
Patient's [facility] nurse, Estill Bamberg, called to report HR today of 38. The patient is completely asymptomatic.  Estill Bamberg called Dr. Drema Dallas (PCP who ordered [facility]) and was instructed to go to the ED for evaluation but he adamantly refused. Estill Bamberg states he has a book of HR logs and for the past 20 years or so the readings have historically been averaging around 50 but there are some readings in the upper 30s. She reports his HR does increase into the 50s with activity. He was recently seen by Dr. Aundra Dubin. Per Dr. Claris Gladden note, "Patient is bradycardic with HR in 40s.  This seems to be asymptomatic and is chronic. HR 44 today." No changes were made. He instructed patient to keep already scheduled follow up with Dr. Radford Pax in October. Reiterated to Estill Bamberg that the patient or [place] needs to call the office if bradycardic AND symptoms occur. She will call if HR lowers and stays decreased. She understands she will be called if Dr. Radford Pax has further recommendations.

## 2016-11-18 NOTE — Telephone Encounter (Signed)
Per Estill Bamberg with [facility]  Pt's HR 38  now

## 2016-11-24 DIAGNOSIS — I2510 Atherosclerotic heart disease of native coronary artery without angina pectoris: Secondary | ICD-10-CM | POA: Diagnosis not present

## 2016-11-24 DIAGNOSIS — N184 Chronic kidney disease, stage 4 (severe): Secondary | ICD-10-CM | POA: Diagnosis not present

## 2016-11-24 DIAGNOSIS — I5032 Chronic diastolic (congestive) heart failure: Secondary | ICD-10-CM | POA: Diagnosis not present

## 2016-11-24 DIAGNOSIS — I701 Atherosclerosis of renal artery: Secondary | ICD-10-CM | POA: Diagnosis not present

## 2016-11-24 DIAGNOSIS — E1122 Type 2 diabetes mellitus with diabetic chronic kidney disease: Secondary | ICD-10-CM | POA: Diagnosis not present

## 2016-11-24 DIAGNOSIS — I150 Renovascular hypertension: Secondary | ICD-10-CM | POA: Diagnosis not present

## 2016-11-25 ENCOUNTER — Encounter: Payer: Self-pay | Admitting: Cardiology

## 2016-11-26 DIAGNOSIS — I129 Hypertensive chronic kidney disease with stage 1 through stage 4 chronic kidney disease, or unspecified chronic kidney disease: Secondary | ICD-10-CM | POA: Diagnosis not present

## 2016-11-26 DIAGNOSIS — I2510 Atherosclerotic heart disease of native coronary artery without angina pectoris: Secondary | ICD-10-CM | POA: Diagnosis not present

## 2016-11-26 DIAGNOSIS — D631 Anemia in chronic kidney disease: Secondary | ICD-10-CM | POA: Diagnosis not present

## 2016-11-26 DIAGNOSIS — N2581 Secondary hyperparathyroidism of renal origin: Secondary | ICD-10-CM | POA: Diagnosis not present

## 2016-11-26 DIAGNOSIS — Z9889 Other specified postprocedural states: Secondary | ICD-10-CM | POA: Diagnosis not present

## 2016-11-26 DIAGNOSIS — Z8719 Personal history of other diseases of the digestive system: Secondary | ICD-10-CM | POA: Diagnosis not present

## 2016-11-26 DIAGNOSIS — N400 Enlarged prostate without lower urinary tract symptoms: Secondary | ICD-10-CM | POA: Diagnosis not present

## 2016-11-26 DIAGNOSIS — N183 Chronic kidney disease, stage 3 (moderate): Secondary | ICD-10-CM | POA: Diagnosis not present

## 2016-12-06 ENCOUNTER — Other Ambulatory Visit: Payer: Self-pay | Admitting: Cardiology

## 2016-12-07 NOTE — Telephone Encounter (Signed)
Please verify statin dose as he was on Caduet at last OV

## 2016-12-14 NOTE — Telephone Encounter (Signed)
Left message to call back  

## 2016-12-16 NOTE — Telephone Encounter (Signed)
Per pt is taking Atorvastatin 10 mg . When pt ws taking Caduet he was on 10/40 mg not sure if needs to increase back to 40 mg .Will forward to Dr Radford Pax for review .Adonis Housekeeper

## 2016-12-17 ENCOUNTER — Telehealth: Payer: Self-pay | Admitting: *Deleted

## 2016-12-17 DIAGNOSIS — E7849 Other hyperlipidemia: Secondary | ICD-10-CM

## 2016-12-17 DIAGNOSIS — Z79899 Other long term (current) drug therapy: Secondary | ICD-10-CM

## 2016-12-17 NOTE — Telephone Encounter (Signed)
Please get an FLP and ALT

## 2016-12-17 NOTE — Telephone Encounter (Signed)
Pt aware per Dr Radford Pax to have fasting lipid and liver testing completed.  Orders placed and pt scheduled for Tuesday next week at his request.  Refill for atorvastatin will be ordered once Dr Radford Pax has these results.   Message  Received: Today  Message Contents  Turner, Eber Hong, MD  P Cv Div [facility] Triage  Caller: Unspecified (1 week ago)      Previous Messages         New medications from outside sources are available for reconciliation.  Requested Medications  ATORVASTATIN 10 MG TABLET Will file in chart as: atorvastatin (LIPITOR) 10 MG tablet TAKE 1 TABLET BY MOUTH EVERY DAY FOR CHOLESTEROL RELATED TO HYPERLIPIDEMIA     Disp: 30 tablet Refills: 0   Class: Normal Start: 12/06/2016  Originally ordered: 2 months ago by Louellen Molder, MD Last refill: 10/17/2016 To be filled at: [id number] - [place], [place]: [phone] Medication Refill   Sueanne Margarita, MD  Cv Div [facility] Triage 5 hours ago (7:46 AM)      Please get an FLP and ALT      Documentation     York, Haze Justin, LPN routed conversation to Sueanne Margarita, MD 20 hours ago (4:49 PM)    Richmond Campbell, LPN 20 hours ago (5:95 PM)      Per pt is taking Atorvastatin 10 mg . When pt ws taking Caduet he was on 10/40 mg not sure if needs to increase back to 40 mg .Will forward to Dr Radford Pax for review ./cy      Documentation     Theodoro Parma, RN routed conversation to [facility] Triage 3 days ago    Theodoro Parma, RN 3 days ago      Left message to call back.      Documentation     Theodoro Parma, RN  [name] 3 days ago    Sueanne Margarita, MD  Theodoro Parma, RN 10 days ago      Please verify statin dose as he was on Caduet at last [place]     Nuala Alpha, LPN routed conversation to Sueanne Margarita, MD 10 days ago    [facility]ox, Event organiser A, [place] Triage 10 days ago    [place] to refill  atorvastatin 10 mg daily for patient or should it go to her PCP? Engineer, manufacturing, [employer] Out routed conversation to [employer] Refill

## 2016-12-17 NOTE — Telephone Encounter (Signed)
Ok to refill at current dose patient is taking but he needs a repeat FLP and ALT

## 2016-12-21 ENCOUNTER — Other Ambulatory Visit: Payer: Medicare Other

## 2016-12-21 ENCOUNTER — Encounter (INDEPENDENT_AMBULATORY_CARE_PROVIDER_SITE_OTHER): Payer: Self-pay

## 2016-12-21 DIAGNOSIS — E7849 Other hyperlipidemia: Secondary | ICD-10-CM

## 2016-12-21 DIAGNOSIS — Z79899 Other long term (current) drug therapy: Secondary | ICD-10-CM | POA: Diagnosis not present

## 2016-12-21 DIAGNOSIS — E784 Other hyperlipidemia: Secondary | ICD-10-CM | POA: Diagnosis not present

## 2016-12-21 LAB — HEPATIC FUNCTION PANEL
ALBUMIN: 4.5 g/dL (ref 3.5–4.7)
ALK PHOS: 67 IU/L (ref 39–117)
ALT: 9 IU/L (ref 0–44)
AST: 17 IU/L (ref 0–40)
BILIRUBIN TOTAL: 0.9 mg/dL (ref 0.0–1.2)
Bilirubin, Direct: 0.24 mg/dL (ref 0.00–0.40)
Total Protein: 7.3 g/dL (ref 6.0–8.5)

## 2016-12-21 LAB — LIPID PANEL
CHOL/HDL RATIO: 2.1 ratio (ref 0.0–5.0)
Cholesterol, Total: 126 mg/dL (ref 100–199)
HDL: 60 mg/dL (ref >39)
LDL Calculated: 57 mg/dL (ref 0–99)
Triglycerides: 46 mg/dL (ref 0–149)
VLDL Cholesterol Cal: 9 mg/dL (ref 5–40)

## 2016-12-28 ENCOUNTER — Other Ambulatory Visit: Payer: Self-pay | Admitting: *Deleted

## 2016-12-28 ENCOUNTER — Other Ambulatory Visit (HOSPITAL_COMMUNITY): Payer: Self-pay | Admitting: Interventional Radiology

## 2016-12-28 DIAGNOSIS — I701 Atherosclerosis of renal artery: Secondary | ICD-10-CM

## 2016-12-30 DIAGNOSIS — N183 Chronic kidney disease, stage 3 (moderate): Secondary | ICD-10-CM | POA: Diagnosis not present

## 2016-12-30 DIAGNOSIS — D631 Anemia in chronic kidney disease: Secondary | ICD-10-CM | POA: Diagnosis not present

## 2017-01-05 DIAGNOSIS — Z7982 Long term (current) use of aspirin: Secondary | ICD-10-CM | POA: Diagnosis not present

## 2017-01-05 DIAGNOSIS — H401121 Primary open-angle glaucoma, left eye, mild stage: Secondary | ICD-10-CM | POA: Diagnosis not present

## 2017-01-05 DIAGNOSIS — E0500 Thyrotoxicosis with diffuse goiter without thyrotoxic crisis or storm: Secondary | ICD-10-CM | POA: Diagnosis not present

## 2017-01-05 DIAGNOSIS — Z961 Presence of intraocular lens: Secondary | ICD-10-CM | POA: Diagnosis not present

## 2017-01-05 DIAGNOSIS — H02532 Eyelid retraction right lower eyelid: Secondary | ICD-10-CM | POA: Diagnosis not present

## 2017-01-05 DIAGNOSIS — E785 Hyperlipidemia, unspecified: Secondary | ICD-10-CM | POA: Diagnosis not present

## 2017-01-05 DIAGNOSIS — Z888 Allergy status to other drugs, medicaments and biological substances status: Secondary | ICD-10-CM | POA: Diagnosis not present

## 2017-01-05 DIAGNOSIS — H02536 Eyelid retraction left eye, unspecified eyelid: Secondary | ICD-10-CM | POA: Diagnosis not present

## 2017-01-05 DIAGNOSIS — Z9841 Cataract extraction status, right eye: Secondary | ICD-10-CM | POA: Diagnosis not present

## 2017-01-05 DIAGNOSIS — H02531 Eyelid retraction right upper eyelid: Secondary | ICD-10-CM | POA: Diagnosis not present

## 2017-01-05 DIAGNOSIS — Z87891 Personal history of nicotine dependence: Secondary | ICD-10-CM | POA: Diagnosis not present

## 2017-01-05 DIAGNOSIS — H0259 Other disorders affecting eyelid function: Secondary | ICD-10-CM | POA: Diagnosis not present

## 2017-01-05 DIAGNOSIS — I509 Heart failure, unspecified: Secondary | ICD-10-CM | POA: Diagnosis not present

## 2017-01-05 DIAGNOSIS — H02831 Dermatochalasis of right upper eyelid: Secondary | ICD-10-CM | POA: Diagnosis not present

## 2017-01-05 DIAGNOSIS — H02535 Eyelid retraction left lower eyelid: Secondary | ICD-10-CM | POA: Diagnosis not present

## 2017-01-05 DIAGNOSIS — I2510 Atherosclerotic heart disease of native coronary artery without angina pectoris: Secondary | ICD-10-CM | POA: Diagnosis not present

## 2017-01-05 DIAGNOSIS — H4720 Unspecified optic atrophy: Secondary | ICD-10-CM | POA: Diagnosis not present

## 2017-01-05 DIAGNOSIS — H02834 Dermatochalasis of left upper eyelid: Secondary | ICD-10-CM | POA: Diagnosis not present

## 2017-01-05 DIAGNOSIS — H02534 Eyelid retraction left upper eyelid: Secondary | ICD-10-CM | POA: Diagnosis not present

## 2017-01-05 DIAGNOSIS — H0520 Unspecified exophthalmos: Secondary | ICD-10-CM | POA: Diagnosis not present

## 2017-01-05 DIAGNOSIS — H02533 Eyelid retraction right eye, unspecified eyelid: Secondary | ICD-10-CM | POA: Diagnosis not present

## 2017-01-05 DIAGNOSIS — Z9842 Cataract extraction status, left eye: Secondary | ICD-10-CM | POA: Diagnosis not present

## 2017-01-05 DIAGNOSIS — I110 Hypertensive heart disease with heart failure: Secondary | ICD-10-CM | POA: Diagnosis not present

## 2017-01-05 DIAGNOSIS — Z79899 Other long term (current) drug therapy: Secondary | ICD-10-CM | POA: Diagnosis not present

## 2017-01-06 ENCOUNTER — Ambulatory Visit: Payer: Medicare Other | Admitting: Cardiology

## 2017-01-07 ENCOUNTER — Encounter: Payer: Self-pay | Admitting: Cardiology

## 2017-01-07 ENCOUNTER — Ambulatory Visit (INDEPENDENT_AMBULATORY_CARE_PROVIDER_SITE_OTHER): Payer: Medicare Other | Admitting: Cardiology

## 2017-01-07 VITALS — BP 168/60 | HR 47 | Ht 69.0 in | Wt 143.0 lb

## 2017-01-07 DIAGNOSIS — I2510 Atherosclerotic heart disease of native coronary artery without angina pectoris: Secondary | ICD-10-CM

## 2017-01-07 DIAGNOSIS — E7800 Pure hypercholesterolemia, unspecified: Secondary | ICD-10-CM | POA: Diagnosis not present

## 2017-01-07 DIAGNOSIS — I6523 Occlusion and stenosis of bilateral carotid arteries: Secondary | ICD-10-CM | POA: Diagnosis not present

## 2017-01-07 DIAGNOSIS — I701 Atherosclerosis of renal artery: Secondary | ICD-10-CM

## 2017-01-07 DIAGNOSIS — I272 Pulmonary hypertension, unspecified: Secondary | ICD-10-CM

## 2017-01-07 DIAGNOSIS — R001 Bradycardia, unspecified: Secondary | ICD-10-CM | POA: Diagnosis not present

## 2017-01-07 DIAGNOSIS — I130 Hypertensive heart and chronic kidney disease with heart failure and stage 1 through stage 4 chronic kidney disease, or unspecified chronic kidney disease: Secondary | ICD-10-CM | POA: Diagnosis not present

## 2017-01-07 NOTE — Patient Instructions (Signed)
Medication Instructions:  The current medical regimen is effective;  continue present plan and medications.  Follow-Up: Follow up in 6 months with Dr. Radford Pax.  You will receive a letter in the mail 2 months before you are due.  Please call us when you receive this letter to schedule your follow up appointment.  If you need a refill on your cardiac medications before your next appointment, please call your pharmacy.  Thank you for choosing [facility]!!

## 2017-01-07 NOTE — Progress Notes (Signed)
Cardiology Office Note:    Date:  01/07/2017   ID:  Jeffrey Campbell, DOB 01/28/1933, MRN [id number]  PCP:  [name], MD  Cardiologist:  Fransico Him, MD   Referring MD: [name], MD   Chief Complaint  Patient presents with  . Coronary Artery Disease  . Hypertension    History of Present Illness:    Jeffrey Campbell is a 83 y.o. male with a hx of ASCAD ([place] 2012 with 50-60% ostial LAD, 50-60%mLAD, 60% pRCA), HTN, dyslipidemia, mild MR with posterior MVP, bradycardia and moderate pulmonary HTN (followed by Dr. Aundra Dubin.  He has had an extensive w/u for pulmonary HTN (Group 10from pulmonary venous HTN in setting of elevated left heart pressure) with PFTs showing mild COPD but moderate to severely reduced DLCO, neg VQ scan for PE and chest CT with no interstitial lung disease. He does have OSA but is intolerant to CPAP. He also has bilateral renal artery stenosis, mesenteric artery stenosis and distal aortic stenosis followed by Dr. Fletcher Anon.  [place] 01/2015 showed mild pulmonary venous HTN with prominent V waves in the PCWP and RA. 2D echo showed mild MR with MVP so prominent V waves felt to be due to stiff ventricles/diastolic dysfunction in setting of poorly controlled HTN. He has chronic diastolic CHF.   He is here today for followup and is doing well.  He denies any chest pain or pressure, SOB, DOE, PND, orthopnea, LE edema, dizziness, palpitations or syncope. He is compliant with his meds and is tolerating meds with no SE.    Past Medical History:  Diagnosis Date  . BPH (benign prostatic hyperplasia)   . CAD (coronary artery disease)   . Carotid artery stenosis    1-39% bilateral carotid stenosis by dopplers 2018  . CKD (chronic kidney disease), stage III ([place])   . Diastolic dysfunction   . Dilated aortic root ([place])    47mm by echo 02/2015  . Glaucoma   . Graves disease   . Heart murmur, systolic   . History of ETOH abuse   . History of PFTs 05/2010   moderate airflow  obstruction w reduced DLCO by PFT   . Hyperlipemia   . Hypertension   . Hyperthyroidism 08/26/10   radioactive iodine therapy   . Mitral regurgitation echo 2015   mild  . Multiple thyroid nodules   . MVP (mitral valve prolapse) 11/2012   posterior MVP  . Organic impotence   . OSA (obstructive sleep apnea)    upper airway resistance syndrome with RDI 18/hr - not on CPAP due to insurance not covering  . PUD (peptic ulcer disease)   . Pulmonary hypertension ([place]) echo 2015   Group 2 with pulmonary venous HTN and Group 3 with OSA  . Shutdown renal    after cardiac cath  . Type 2 diabetes, diet controlled ([place])   . Upper airway resistance syndrome     Past Surgical History:  Procedure Laterality Date  . APPENDECTOMY    . CARDIAC CATHETERIZATION    . CARDIAC CATHETERIZATION N/A 02/17/2015   Procedure: Right Heart Cath;  Surgeon: Larey Dresser, MD;  Location: [facility];  Service: Cardiovascular;  Laterality: N/A;  . ESOPHAGOGASTRODUODENOSCOPY (EGD) WITH PROPOFOL Left 10/16/2016   Procedure: ESOPHAGOGASTRODUODENOSCOPY (EGD) WITH PROPOFOL;  Surgeon: Ronnette Juniper, MD;  Location: [facility];  Service: Gastroenterology;  Laterality: Left;  . heart catherization    . HERNIA REPAIR  10/2009  . RIGHT HEART CATH N/A 07/28/2016  Procedure: Right Heart Cath;  Surgeon: Larey Dresser, MD;  Location: [facility];  Service: Cardiovascular;  Laterality: N/A;    Current Medications: Current Meds  Medication Sig  . amLODipine (NORVASC) 10 MG tablet Take 1 tablet (10 mg total) by mouth daily.  Marland Kitchen aspirin EC 81 MG tablet Take 81 mg by mouth daily.  Marland Kitchen atorvastatin (LIPITOR) 10 MG tablet Take 1 tablet (10 mg total) by mouth daily.  . Brinzolamide-Brimonidine (SIMBRINZA) 1-0.2 % SUSP Place 1 drop into both eyes 2 (two) times daily.   Marland Kitchen doxazosin (CARDURA) 2 MG tablet Take 2 mg by mouth daily.  . feeding supplement, ENSURE ENLIVE, (ENSURE ENLIVE) LIQD Take 237 mLs by mouth 2 (two) times  daily between meals.  . ferrous sulfate 325 (65 FE) MG tablet Take 1 tablet (325 mg total) by mouth 2 (two) times daily with a meal.  . furosemide (LASIX) 40 MG tablet Take 1 tablet (40 mg total) by mouth every other day.  . hydrALAZINE (APRESOLINE) 100 MG tablet TAKE 1 TABLET (100 MG TOTAL) BY MOUTH 3 (THREE) TIMES DAILY. (Patient taking differently: Take 100 mg by mouth 3 (three) times daily. )  . levothyroxine (SYNTHROID, LEVOTHROID) 137 MCG tablet Take 137 mcg by mouth daily before breakfast.   . Multiple Vitamin (MULTIVITAMIN WITH MINERALS) TABS tablet Take 1 tablet by mouth daily.  . pantoprazole (PROTONIX) 40 MG tablet Take 1 tablet (40 mg total) by mouth daily.  . potassium chloride (KLOR-CON M10) 10 MEQ tablet Take 1 tablet (10 mEq total) by mouth daily.  . QUEtiapine (SEROQUEL) 25 MG tablet Take 1 tablet (25 mg total) by mouth at bedtime.     Allergies:   Iodinated diagnostic agents; Zocor [simvastatin]; Tricor [fenofibrate]; Budesonide-formoterol fumarate; and Tiotropium bromide monohydrate   Social History   Social History  . Marital status: Married    Spouse name: N/A  . Number of children: 4  . Years of education: N/A   Occupational History  . Retired Retired    [employer]   Social History Main Topics  . Smoking status: Former Smoker    Packs/day: 2.00    Years: 40.00    Types: Cigarettes    Quit date: 03/29/1984  . Smokeless tobacco: Never Used  . Alcohol use No     Comment: quit in 1981  . Drug use: No  . Sexual activity: Not Asked   Other Topics Concern  . None   Social History Narrative  . None     Family History: The patient's family history includes Colon cancer in his brother, maternal uncle, and mother; Hypertension in his father; Kidney failure in his father; Lung disease in his brother.  ROS:   Please see the history of present illness.    ROS  All other systems reviewed and negative.   EKGs/Labs/Other Studies Reviewed:    The  following studies were reviewed today: none  EKG:  EKG is not ordered today.   Recent Labs: 04/28/2016: NT-Pro BNP 1,296 09/04/2016: TSH 0.602 09/13/2016: Magnesium 2.4 11/03/2016: BUN 62; Creatinine, Ser 3.14; Hemoglobin 9.0; Platelets 202; Potassium 4.2; Sodium 139 12/21/2016: ALT 9   Recent Lipid Panel    Component Value Date/Time   CHOL 126 12/21/2016 0914   TRIG 46 12/21/2016 0914   HDL 60 12/21/2016 0914   CHOLHDL 2.1 12/21/2016 0914   CHOLHDL 1.9 01/20/2016 0909   VLDL 11 01/20/2016 0909   LDLCALC 57 12/21/2016 0914    Physical Exam:  VS:  BP (!) 168/60   Pulse (!) 47   Ht 5\' 9"  (1.753 m)   Wt 143 lb (64.9 kg)   SpO2 97%   BMI 21.12 kg/m     Wt Readings from Last 3 Encounters:  01/07/17 143 lb (64.9 kg)  11/03/16 138 lb 8 oz (62.8 kg)  10/19/16 145 lb (65.8 kg)     GEN:  Well nourished, well developed in no acute distress HEENT: Normal NECK: No JVD; No carotid bruits LYMPHATICS: No lymphadenopathy CARDIAC: RRR, no rubs, gallops.  2/6 SM at RUSB RESPIRATORY:  Clear to auscultation without rales, wheezing or rhonchi  ABDOMEN: Soft, non-tender, non-distended MUSCULOSKELETAL:  No edema; No deformity  SKIN: Warm and dry NEUROLOGIC:  Alert and oriented x 3 PSYCHIATRIC:  Normal affect   ASSESSMENT:    1. Pulmonary hypertension ([place])   2. Bradycardia   3. Hypertensive heart and chronic kidney disease with heart failure and stage 1 through stage 4 chronic kidney disease, or chronic kidney disease ([place])   4. Coronary artery disease involving native coronary artery of native heart without angina pectoris   5. Bilateral carotid artery stenosis   6. Pure hypercholesterolemia    PLAN:    In order of problems listed above:  1. Pulmonary HTN: He has had an extensive w/u for pulmonary HTN (Group 53from pulmonary venous HTN in setting of elevated left heart pressure) with PFTs showing mild COPD but moderate to severely reduced DLCO, neg VQ scan for PE and chest CT  with no interstitial lung disease. He does have OSA but is intolerant to CPAP.  Pulmonary artery pressure had increase since prior study, now at 88 mmHg and he underwent right heart catheterization with Dr. Aundra Dubin showing mildly elevated right and left heart pressures with moderate PHTN with PVR 1.74 WU and PCW 86mmHg c/w pulmonary venous HTN with a component of Type 3 from OSA and not a candidate for pulmonary vasodilators.  He will continue on diuretics.  He denies any SOB, PND orthopnea or LE edema.   2. Junctional bradycardia: noted on EKG at last OV in April with occasional PVCs. Ventricular rates 42 bpm. He remains  asymptomatic, denying chest pain, dizziness, dyspnea, fatigue, syncope/near-syncope. He is not on any AV nodal blocking agents but he does have a history of hypothyroidism, on Synthroid for hormone replacement therapy. 48-hour Holter monitor showed  Sinus bradycardia at 45bpm - patient asymptomatic.    3. HTN: BP is elevated on exam today.   He will continue on amlodipine 10mg  daily, doxazosin 2mg  daily, hydralazine 100mg  TID.  4. CAD: cath with 50-60% ostial LAD, 50-60%mLAD, 60% pRCA.  He has no anginal chest pain. He will continue on ASA and statin.   5;  Bilateral carotid artery stenosis (1-39% by dopplers 06/2016) - continue ASA and statin.    6.  Hyperlipidemia - LDL goal < 70.  LDL 57 this month.  He will continue on atorvastatin 10mg  daily.     Medication Adjustments/Labs and Tests Ordered: Current medicines are reviewed at length with the patient today.  Concerns regarding medicines are outlined above.  No orders of the defined types were placed in this encounter.  No orders of the defined types were placed in this encounter.   Signed, Fransico Him, MD  01/07/2017 1:40 PM    [place]

## 2017-01-10 ENCOUNTER — Encounter: Payer: Self-pay | Admitting: Interventional Radiology

## 2017-01-13 DIAGNOSIS — I701 Atherosclerosis of renal artery: Secondary | ICD-10-CM | POA: Diagnosis not present

## 2017-01-13 LAB — BASIC METABOLIC PANEL WITH GFR
BUN / CREAT RATIO: 15 (calc) (ref 6–22)
BUN: 47 mg/dL — AB (ref 7–25)
CO2: 24 mmol/L (ref 20–32)
CREATININE: 3.1 mg/dL — AB (ref 0.70–1.11)
Calcium: 9.5 mg/dL (ref 8.6–10.3)
Chloride: 110 mmol/L (ref 98–110)
GFR, EST AFRICAN AMERICAN: 21 mL/min/{1.73_m2} — AB (ref >=60)
GFR, EST NON AFRICAN AMERICAN: 18 mL/min/{1.73_m2} — AB (ref >=60)
GLUCOSE: 89 mg/dL (ref 65–99)
Potassium: 4.2 mmol/L (ref 3.5–5.3)
SODIUM: 141 mmol/L (ref 135–146)

## 2017-01-18 ENCOUNTER — Ambulatory Visit
Admission: RE | Admit: 2017-01-18 | Discharge: 2017-01-18 | Disposition: A | Discharge status: Home / Self Care | Payer: Medicare Other | Source: Ambulatory Visit | Attending: Interventional Radiology | Admitting: Interventional Radiology

## 2017-01-18 DIAGNOSIS — I701 Atherosclerosis of renal artery: Secondary | ICD-10-CM | POA: Diagnosis not present

## 2017-01-18 DIAGNOSIS — N183 Chronic kidney disease, stage 3 (moderate): Secondary | ICD-10-CM | POA: Diagnosis not present

## 2017-01-18 HISTORY — PX: IR RADIOLOGIST EVAL & MGMT: IMG5224

## 2017-01-18 NOTE — Progress Notes (Signed)
Chief Complaint: F/U Renal artery stenosis  Referring Physician(s): McCullough,Heath  Supervising Physician: Jacqulynn Cadet  History of Present Illness: Jeffrey Campbell is a 83 y.o. male  with stage III chronic kidney disease, diastolic heart dysfunction, pulmonary arterial hypertension, and systemic arterial hypertension.   In October 2016 he had a duplex renal artery ultrasound which demonstrated suspicion for bilateral greater than 60% renal artery stenoses.   Additionally, on prior ultrasound imaging his kidneys have been demonstrated to be echogenic and slightly small in size.    He was admitted to the hospital on October 15, 2016 with labile hypertensive emergency and hypertensive encephalopathy.   His hypertension was a difficult to control, and during his admission the addition of an ACE inhibitor led to a worsening of his renal function which lends clinical support to the presence of underlying renal artery stenosis.   His creatinine peaked at [facility] 09/19/2016.   MRA performed 10/19/16 confirms severe bilateral renal artery stenoses.   He was seen initially by Dr. Laurence Ferrari on 10/19/2016 for discussion of possible CO2 angiography and renal artery stent placement.   At the time of that visit his renal function had improved and was 2.4 at that time.  He and his family decided to hold off on Renal stenting at that time.  He is seen in clinic today for follow up. He is alone today.  Most recent creatinine done 01/13/2017 is 3.10 which is slightly worse.  His SBP is very high today. It is 235 in the right arm and 220 in the left.  He confesses to not taking his BP medications this morning. He states he only took his thyroid medication.  He assured me he would take his BP medications as soon as he gets home after this visit.  He states he is doing very well. He tells me he shampooed is carpet yesterday and no issues, no fatigue. He also states he ate Pork Chops  last night and feels that led to worsening HTN.  He tells me he has recently seen Dr. Justin Mend and Golden Hurter.  He reassured me that they told him "everything was fine".  Past Medical History:  Diagnosis Date  . BPH (benign prostatic hyperplasia)   . CAD (coronary artery disease)   . Carotid artery stenosis    1-39% bilateral carotid stenosis by dopplers 2018  . CKD (chronic kidney disease), stage III ([place])   . Diastolic dysfunction   . Dilated aortic root ([place])    49mm by echo 02/2015  . Glaucoma   . Graves disease   . Heart murmur, systolic   . History of ETOH abuse   . History of PFTs 05/2010   moderate airflow obstruction w reduced DLCO by PFT   . Hyperlipemia   . Hypertension   . Hyperthyroidism 08/26/10   radioactive iodine therapy   . Mitral regurgitation echo 2015   mild  . Multiple thyroid nodules   . MVP (mitral valve prolapse) 11/2012   posterior MVP  . Organic impotence   . OSA (obstructive sleep apnea)    upper airway resistance syndrome with RDI 18/hr - not on CPAP due to insurance not covering  . PUD (peptic ulcer disease)   . Pulmonary hypertension ([place]) echo 2015   Group 2 with pulmonary venous HTN and Group 3 with OSA  . Shutdown renal    after cardiac cath  . Type 2 diabetes, diet controlled ([place])   . Upper airway resistance syndrome  Past Surgical History:  Procedure Laterality Date  . APPENDECTOMY    . CARDIAC CATHETERIZATION    . CARDIAC CATHETERIZATION N/A 02/17/2015   Procedure: Right Heart Cath;  Surgeon: Larey Dresser, MD;  Location: [place] [facility];  Service: Cardiovascular;  Laterality: N/A;  . ESOPHAGOGASTRODUODENOSCOPY (EGD) WITH PROPOFOL Left 10/16/2016   Procedure: ESOPHAGOGASTRODUODENOSCOPY (EGD) WITH PROPOFOL;  Surgeon: Ronnette Juniper, MD;  Location: [place];  Service: Gastroenterology;  Laterality: Left;  . heart catherization    . HERNIA REPAIR  10/2009  . IR RADIOLOGIST EVAL & MGMT  09/28/2016  . IR RADIOLOGIST EVAL & MGMT   10/19/2016  . RIGHT HEART CATH N/A 07/28/2016   Procedure: Right Heart Cath;  Surgeon: Larey Dresser, MD;  Location: [place] [facility];  Service: Cardiovascular;  Laterality: N/A;    Allergies: Iodinated diagnostic agents; Zocor [simvastatin]; Tricor [fenofibrate]; Budesonide-formoterol fumarate; and Tiotropium bromide monohydrate  Medications: Prior to Admission medications   Medication Sig Start Date End Date Taking? Authorizing Provider  amLODipine (NORVASC) 10 MG tablet Take 1 tablet (10 mg total) by mouth daily. 09/24/16 09/24/17  Dhungel, Flonnie Overman, MD  aspirin EC 81 MG tablet Take 81 mg by mouth daily.    [provider]  atorvastatin (LIPITOR) 10 MG tablet Take 1 tablet (10 mg total) by mouth daily. 09/24/16 09/24/17  Dhungel, Nishant, MD  Brinzolamide-Brimonidine (SIMBRINZA) 1-0.2 % SUSP Place 1 drop into both eyes 2 (two) times daily.     [provider]  doxazosin (CARDURA) 2 MG tablet Take 2 mg by mouth daily.    [provider]  feeding supplement, ENSURE ENLIVE, (ENSURE ENLIVE) LIQD Take 237 mLs by mouth 2 (two) times daily between meals. 09/24/16   Dhungel, Flonnie Overman, MD  ferrous sulfate 325 (65 FE) MG tablet Take 1 tablet (325 mg total) by mouth 2 (two) times daily with a meal. 10/17/16   Rosita Fire, MD  furosemide (LASIX) 40 MG tablet Take 1 tablet (40 mg total) by mouth every other day. 11/03/16   Larey Dresser, MD  hydrALAZINE (APRESOLINE) 100 MG tablet TAKE 1 TABLET (100 MG TOTAL) BY MOUTH 3 (THREE) TIMES DAILY. Patient taking differently: Take 100 mg by mouth 3 (three) times daily.  08/24/16   Sueanne Margarita, MD  levothyroxine (SYNTHROID, LEVOTHROID) 137 MCG tablet Take 137 mcg by mouth daily before breakfast.  06/28/16   [provider]  Multiple Vitamin (MULTIVITAMIN WITH MINERALS) TABS tablet Take 1 tablet by mouth daily.    [provider]  pantoprazole (PROTONIX) 40 MG tablet Take 1 tablet (40 mg total) by mouth daily.  10/18/16   Rosita Fire, MD  potassium chloride (KLOR-CON M10) 10 MEQ tablet Take 1 tablet (10 mEq total) by mouth daily. 11/03/16   Larey Dresser, MD  QUEtiapine (SEROQUEL) 25 MG tablet Take 1 tablet (25 mg total) by mouth at bedtime. 09/24/16   Dhungel, Flonnie Overman, MD     Family History  Problem Relation Age of Onset  . Kidney failure Father   . Hypertension Father   . Colon cancer Mother   . Colon cancer Brother   . Lung disease Brother   . Colon cancer Maternal Uncle     Social History   Social History  . Marital status: Married    Spouse name: N/A  . Number of children: 4  . Years of education: N/A   Occupational History  . Retired Retired    [employer]   Social  History Main Topics  . Smoking status: Former Smoker    Packs/day: 2.00    Years: 40.00    Types: Cigarettes    Quit date: 03/29/1984  . Smokeless tobacco: Never Used  . Alcohol use No     Comment: quit in 1981  . Drug use: No  . Sexual activity: Not Asked   Other Topics Concern  . None   Social History Narrative  . None     Review of Systems: A 12 point ROS discussed  Review of Systems  Constitutional: Negative.   HENT: Negative.   Respiratory: Negative.   Cardiovascular: Negative.   Gastrointestinal: Negative.   Genitourinary: Negative.   Musculoskeletal: Negative.   Skin: Negative.   Neurological: Negative.   Hematological: Negative.   Psychiatric/Behavioral: Negative.     Vital Signs: BP (!) 220/74 (BP Location: Left Arm, Patient Position: Sitting, Cuff Size: Normal)   Pulse (!) 43 Comment: patient states this is a normal heart rate for him  Temp 97.6 F (36.4 C)   Resp 16   SpO2 94%   Physical Exam  Constitutional: He is oriented to person, place, and time. He appears well-developed.  HENT:  Head: Normocephalic and atraumatic.  Eyes: EOM are normal.  Neck: Normal range of motion.  Cardiovascular: Regular rhythm.   Bradycardic with loud heart sounds    Pulmonary/Chest: Effort normal and breath sounds normal. No respiratory distress. He has no wheezes.  Abdominal: Soft. He exhibits no distension. There is no tenderness.  Musculoskeletal: Normal range of motion.  Neurological: He is alert and oriented to person, place, and time.  Skin: Skin is warm and dry.  Psychiatric: He has a normal mood and affect. His behavior is normal. Judgment and thought content normal.  Vitals reviewed.   Imaging: No results found.  Labs:  CBC:  Recent Labs  10/15/16 1433 10/16/16 0233 10/17/16 0203 11/03/16 0941  WBC 5.7 6.4 6.1 7.0  HGB 6.1* 7.3* 7.5* 9.0*  HCT 19.6* 22.7* 23.4* 28.5*  PLT 222 215 230 202    COAGS:  Recent Labs  07/21/16 1037 10/15/16 1433  INR 1.1 1.00    BMP:  Recent Labs  10/16/16 0233 10/18/16 0819 11/03/16 0941 01/13/17 0920  NA 140 138 139 141  K 3.7 4.0 4.2 4.2  CL 113* 108 103 110  CO2 21* 22 25 24   GLUCOSE 94 89 184* 89  BUN 57* 44* 62* 47*  CALCIUM 8.7* 8.7 9.6 9.5  CREATININE 2.82* 2.74* 3.14* 3.10*  GFRNONAA 20* 21* 17* 18*  GFRAA 23* 24* 20* 21*    LIVER FUNCTION TESTS:  Recent Labs  01/20/16 0909 07/06/16 0854 10/15/16 1433 12/21/16 0914  BILITOT 1.2 0.9 1.0 0.9  AST 18 17 28 17   ALT 13 14 21 9   ALKPHOS 57 70 68 67  PROT 6.5 6.8 6.9 7.3  ALBUMIN 4.0 4.2 3.6 4.5    TUMOR MARKERS: No results for input(s): AFPTM, CEA, CA199, CHROMGRNA in the last 8760 hours.  Assessment:  Renal artery stenosis with slight increase in creatinine to 3.10.  Dr. Laurence Ferrari saw the patient today and had a discussion with him regarding his very high blood pressure, increasing renal function, and renal artery stenting.  Patient states he is doing well and would like to continue to hold off on renal artery angioplasty/stent procedure.  Mr Cato did not wish to make a follow up appointment and states that he or one of his other providers will call us back if  he wishes to go forward with the  procedure.  Electronically Signed: Murrell Redden PA-C 01/18/2017, 9:54 AM   Please refer to Dr. Katrinka Blazing attestation of this note for management and plan.

## 2017-01-26 ENCOUNTER — Encounter: Payer: Self-pay | Admitting: Interventional Radiology

## 2017-01-26 DIAGNOSIS — N2581 Secondary hyperparathyroidism of renal origin: Secondary | ICD-10-CM | POA: Diagnosis not present

## 2017-01-26 DIAGNOSIS — I2510 Atherosclerotic heart disease of native coronary artery without angina pectoris: Secondary | ICD-10-CM | POA: Diagnosis not present

## 2017-01-26 DIAGNOSIS — I129 Hypertensive chronic kidney disease with stage 1 through stage 4 chronic kidney disease, or unspecified chronic kidney disease: Secondary | ICD-10-CM | POA: Diagnosis not present

## 2017-01-26 DIAGNOSIS — Z8719 Personal history of other diseases of the digestive system: Secondary | ICD-10-CM | POA: Diagnosis not present

## 2017-01-26 DIAGNOSIS — N400 Enlarged prostate without lower urinary tract symptoms: Secondary | ICD-10-CM | POA: Diagnosis not present

## 2017-01-26 DIAGNOSIS — D631 Anemia in chronic kidney disease: Secondary | ICD-10-CM | POA: Diagnosis not present

## 2017-01-26 DIAGNOSIS — Z9889 Other specified postprocedural states: Secondary | ICD-10-CM | POA: Diagnosis not present

## 2017-01-26 DIAGNOSIS — N183 Chronic kidney disease, stage 3 (moderate): Secondary | ICD-10-CM | POA: Diagnosis not present

## 2017-01-31 ENCOUNTER — Other Ambulatory Visit: Payer: Self-pay | Admitting: Cardiology

## 2017-02-04 DIAGNOSIS — H401133 Primary open-angle glaucoma, bilateral, severe stage: Secondary | ICD-10-CM | POA: Diagnosis not present

## 2017-02-04 DIAGNOSIS — Z961 Presence of intraocular lens: Secondary | ICD-10-CM | POA: Diagnosis not present

## 2017-02-04 DIAGNOSIS — H43812 Vitreous degeneration, left eye: Secondary | ICD-10-CM | POA: Diagnosis not present

## 2017-02-04 DIAGNOSIS — H348312 Tributary (branch) retinal vein occlusion, right eye, stable: Secondary | ICD-10-CM | POA: Diagnosis not present

## 2017-02-08 ENCOUNTER — Encounter (HOSPITAL_COMMUNITY): Payer: Medicare Other

## 2017-02-10 ENCOUNTER — Other Ambulatory Visit (HOSPITAL_COMMUNITY): Payer: Self-pay

## 2017-02-10 DIAGNOSIS — H348312 Tributary (branch) retinal vein occlusion, right eye, stable: Secondary | ICD-10-CM | POA: Diagnosis not present

## 2017-02-10 DIAGNOSIS — H401121 Primary open-angle glaucoma, left eye, mild stage: Secondary | ICD-10-CM | POA: Diagnosis not present

## 2017-02-10 DIAGNOSIS — H401113 Primary open-angle glaucoma, right eye, severe stage: Secondary | ICD-10-CM | POA: Diagnosis not present

## 2017-02-10 DIAGNOSIS — E0500 Thyrotoxicosis with diffuse goiter without thyrotoxic crisis or storm: Secondary | ICD-10-CM | POA: Diagnosis not present

## 2017-02-11 ENCOUNTER — Ambulatory Visit (HOSPITAL_COMMUNITY)
Admission: RE | Admit: 2017-02-11 | Discharge: 2017-02-11 | Disposition: A | Discharge status: Home / Self Care | Payer: Medicare Other | Source: Ambulatory Visit | Attending: Nephrology | Admitting: Nephrology

## 2017-02-11 VITALS — BP 156/56 | HR 54 | Temp 98.6°F | Resp 20

## 2017-02-11 DIAGNOSIS — I130 Hypertensive heart and chronic kidney disease with heart failure and stage 1 through stage 4 chronic kidney disease, or unspecified chronic kidney disease: Secondary | ICD-10-CM

## 2017-02-11 DIAGNOSIS — D631 Anemia in chronic kidney disease: Secondary | ICD-10-CM | POA: Diagnosis not present

## 2017-02-11 DIAGNOSIS — N183 Chronic kidney disease, stage 3 (moderate): Secondary | ICD-10-CM | POA: Insufficient documentation

## 2017-02-11 LAB — POCT HEMOGLOBIN-HEMACUE: Hemoglobin: 8.5 g/dL — ABNORMAL LOW (ref 13.0–17.0)

## 2017-02-11 MED ORDER — EPOETIN ALFA 10000 UNIT/ML IJ SOLN
INTRAMUSCULAR | Status: AC
  Administered 2017-02-11: 13:00:00 5000 [IU] via SUBCUTANEOUS
  Filled 2017-02-11: qty 1

## 2017-02-11 MED ORDER — FERUMOXYTOL INJECTION 510 MG/17 ML
510.0000 mg | INTRAVENOUS | Status: DC
Start: 2017-02-11 — End: 2017-02-12
  Administered 2017-02-11: 13:00:00 510 mg via INTRAVENOUS
  Filled 2017-02-11: qty 17

## 2017-02-11 MED ORDER — EPOETIN ALFA 10000 UNIT/ML IJ SOLN
5000.0000 [IU] | INTRAMUSCULAR | Status: DC
  Administered 2017-02-11: 5000 [IU] via SUBCUTANEOUS

## 2017-02-11 NOTE — Discharge Instructions (Signed)
Ferumoxytol injection °What is this medicine? °FERUMOXYTOL is an iron complex. Iron is used to make healthy red blood cells, which carry oxygen and nutrients throughout the body. This medicine is used to treat iron deficiency anemia in people with chronic kidney disease. °This medicine may be used for other purposes; ask your health care provider or pharmacist if you have questions. °COMMON BRAND NAME(S): Feraheme °What should I tell my health care provider before I take this medicine? °They need to know if you have any of these conditions: °-anemia not caused by low iron levels °-high levels of iron in the blood °-magnetic resonance imaging (MRI) test scheduled °-an unusual or allergic reaction to iron, other medicines, foods, dyes, or preservatives °-pregnant or trying to get pregnant °-breast-feeding °How should I use this medicine? °This medicine is for injection into a vein. It is given by a health care professional in a hospital or clinic setting. °Talk to your pediatrician regarding the use of this medicine in children. Special care may be needed. °Overdosage: If you think you have taken too much of this medicine contact a poison control center or emergency room at once. °NOTE: This medicine is only for you. Do not share this medicine with others. °What if I miss a dose? °It is important not to miss your dose. Call your doctor or health care professional if you are unable to keep an appointment. °What may interact with this medicine? °This medicine may interact with the following medications: °-other iron products °This list may not describe all possible interactions. Give your health care provider a list of all the medicines, herbs, non-prescription drugs, or dietary supplements you use. Also tell them if you smoke, drink alcohol, or use illegal drugs. Some items may interact with your medicine. °What should I watch for while using this medicine? °Visit your doctor or healthcare professional regularly. Tell  your doctor or healthcare professional if your symptoms do not start to get better or if they get worse. You may need blood work done while you are taking this medicine. °You may need to follow a special diet. Talk to your doctor. Foods that contain iron include: whole grains/cereals, dried fruits, beans, or peas, leafy green vegetables, and organ meats (liver, kidney). °What side effects may I notice from receiving this medicine? °Side effects that you should report to your doctor or health care professional as soon as possible: °-allergic reactions like skin rash, itching or hives, swelling of the face, lips, or tongue °-breathing problems °-changes in blood pressure °-feeling faint or lightheaded, falls °-fever or chills °-flushing, sweating, or hot feelings °-swelling of the ankles or feet °Side effects that usually do not require medical attention (report to your doctor or health care professional if they continue or are bothersome): °-diarrhea °-headache °-nausea, vomiting °-stomach pain °This list may not describe all possible side effects. Call your doctor for medical advice about side effects. You may report side effects to FDA at 1-800-FDA-1088. °Where should I keep my medicine? °This drug is given in a hospital or clinic and will not be stored at home. °NOTE: This sheet is a summary. It may not cover all possible information. If you have questions about this medicine, talk to your doctor, pharmacist, or health care provider. °© 2018 Elsevier/Gold Standard (2015-04-17 12:41:49) ° ° °Epoetin Alfa injection °What is this medicine? °EPOETIN ALFA (e POE e tin AL fa) helps your body make more red blood cells. This medicine is used to treat anemia caused by chronic kidney   failure, cancer chemotherapy, or HIV-therapy. It may also be used before surgery if you have anemia. °This medicine may be used for other purposes; ask your health care provider or pharmacist if you have questions. °COMMON BRAND NAME(S):  Epogen, Procrit °What should I tell my health care provider before I take this medicine? °They need to know if you have any of these conditions: °-blood clotting disorders °-cancer patient not on chemotherapy °-cystic fibrosis °-heart disease, such as angina or heart failure °-hemoglobin level of 12 g/dL or greater °-high blood pressure °-low levels of folate, iron, or vitamin B12 °-seizures °-an unusual or allergic reaction to erythropoietin, albumin, benzyl alcohol, hamster proteins, other medicines, foods, dyes, or preservatives °-pregnant or trying to get pregnant °-breast-feeding °How should I use this medicine? °This medicine is for injection into a vein or under the skin. It is usually given by a health care professional in a hospital or clinic setting. °If you get this medicine at home, you will be taught how to prepare and give this medicine. Use exactly as directed. Take your medicine at regular intervals. Do not take your medicine more often than directed. °It is important that you put your used needles and syringes in a special sharps container. Do not put them in a trash can. If you do not have a sharps container, call your pharmacist or healthcare provider to get one. °A special MedGuide will be given to you by the pharmacist with each prescription and refill. Be sure to read this information carefully each time. °Talk to your pediatrician regarding the use of this medicine in children. While this drug may be prescribed for selected conditions, precautions do apply. °Overdosage: If you think you have taken too much of this medicine contact a poison control center or emergency room at once. °NOTE: This medicine is only for you. Do not share this medicine with others. °What if I miss a dose? °If you miss a dose, take it as soon as you can. If it is almost time for your next dose, take only that dose. Do not take double or extra doses. °What may interact with this medicine? °Do not take this medicine with  any of the following medications: °-darbepoetin alfa °This list may not describe all possible interactions. Give your health care provider a list of all the medicines, herbs, non-prescription drugs, or dietary supplements you use. Also tell them if you smoke, drink alcohol, or use illegal drugs. Some items may interact with your medicine. °What should I watch for while using this medicine? °Your condition will be monitored carefully while you are receiving this medicine. °You may need blood work done while you are taking this medicine. °What side effects may I notice from receiving this medicine? °Side effects that you should report to your doctor or health care professional as soon as possible: °-allergic reactions like skin rash, itching or hives, swelling of the face, lips, or tongue °-breathing problems °-changes in vision °-chest pain °-confusion, trouble speaking or understanding °-feeling faint or lightheaded, falls °-high blood pressure °-muscle aches or pains °-pain, swelling, warmth in the leg °-rapid weight gain °-severe headaches °-sudden numbness or weakness of the face, arm or leg °-trouble walking, dizziness, loss of balance or coordination °-seizures (convulsions) °-swelling of the ankles, feet, hands °-unusually weak or tired °Side effects that usually do not require medical attention (report to your doctor or health care professional if they continue or are bothersome): °-diarrhea °-fever, chills (flu-like symptoms) °-headaches °-nausea, vomiting °-redness, stinging,   or swelling at site where injected °This list may not describe all possible side effects. Call your doctor for medical advice about side effects. You may report side effects to FDA at 1-800-FDA-1088. °Where should I keep my medicine? °Keep out of the reach of children. °Store in a refrigerator between 2 and 8 degrees C (36 and 46 degrees F). Do not freeze or shake. Throw away any unused portion if using a single-dose vial. Multi-dose  vials can be kept in the refrigerator for up to 21 days after the initial dose. Throw away unused medicine. °NOTE: This sheet is a summary. It may not cover all possible information. If you have questions about this medicine, talk to your doctor, pharmacist, or health care provider. °© 2018 Elsevier/Gold Standard (2015-11-03 19:42:31) ° °

## 2017-02-14 DIAGNOSIS — N2581 Secondary hyperparathyroidism of renal origin: Secondary | ICD-10-CM | POA: Diagnosis not present

## 2017-02-14 DIAGNOSIS — I2510 Atherosclerotic heart disease of native coronary artery without angina pectoris: Secondary | ICD-10-CM | POA: Diagnosis not present

## 2017-02-14 DIAGNOSIS — N400 Enlarged prostate without lower urinary tract symptoms: Secondary | ICD-10-CM | POA: Diagnosis not present

## 2017-02-14 DIAGNOSIS — Z8719 Personal history of other diseases of the digestive system: Secondary | ICD-10-CM | POA: Diagnosis not present

## 2017-02-14 DIAGNOSIS — Z9889 Other specified postprocedural states: Secondary | ICD-10-CM | POA: Diagnosis not present

## 2017-02-14 DIAGNOSIS — D631 Anemia in chronic kidney disease: Secondary | ICD-10-CM | POA: Diagnosis not present

## 2017-02-14 DIAGNOSIS — I129 Hypertensive chronic kidney disease with stage 1 through stage 4 chronic kidney disease, or unspecified chronic kidney disease: Secondary | ICD-10-CM | POA: Diagnosis not present

## 2017-02-14 DIAGNOSIS — N183 Chronic kidney disease, stage 3 (moderate): Secondary | ICD-10-CM | POA: Diagnosis not present

## 2017-02-18 ENCOUNTER — Encounter (HOSPITAL_COMMUNITY)
Admission: RE | Admit: 2017-02-18 | Discharge: 2017-02-18 | Disposition: A | Discharge status: Home / Self Care | Payer: Medicare Other | Source: Ambulatory Visit | Attending: Nephrology | Admitting: Nephrology

## 2017-02-18 VITALS — BP 163/58 | HR 49 | Temp 98.6°F | Resp 20 | Ht 69.0 in | Wt 145.0 lb

## 2017-02-18 DIAGNOSIS — I130 Hypertensive heart and chronic kidney disease with heart failure and stage 1 through stage 4 chronic kidney disease, or unspecified chronic kidney disease: Secondary | ICD-10-CM | POA: Diagnosis not present

## 2017-02-18 MED ORDER — EPOETIN ALFA 10000 UNIT/ML IJ SOLN: 5000.0000 [IU] | INTRAMUSCULAR | Status: DC

## 2017-02-18 MED ORDER — SODIUM CHLORIDE 0.9 % IV SOLN
510.0000 mg | INTRAVENOUS | Status: DC
  Administered 2017-02-18: 510 mg via INTRAVENOUS
  Filled 2017-02-18: qty 17

## 2017-02-18 MED ORDER — EPOETIN ALFA 10000 UNIT/ML IJ SOLN
INTRAMUSCULAR | Status: AC
  Administered 2017-02-18: 09:00:00 5000 [IU]
  Filled 2017-02-18: qty 1

## 2017-02-21 LAB — POCT HEMOGLOBIN-HEMACUE: HEMOGLOBIN: 8.9 g/dL — AB (ref 13.0–17.0)

## 2017-02-25 ENCOUNTER — Encounter (HOSPITAL_COMMUNITY)
Admission: RE | Admit: 2017-02-25 | Discharge: 2017-02-25 | Disposition: A | Discharge status: Home / Self Care | Payer: Medicare Other | Source: Ambulatory Visit | Attending: Nephrology | Admitting: Nephrology

## 2017-02-25 VITALS — BP 152/58 | HR 61 | Temp 98.1°F | Resp 20

## 2017-02-25 DIAGNOSIS — I130 Hypertensive heart and chronic kidney disease with heart failure and stage 1 through stage 4 chronic kidney disease, or unspecified chronic kidney disease: Secondary | ICD-10-CM

## 2017-02-25 LAB — IRON AND TIBC
Iron: 29 ug/dL — ABNORMAL LOW (ref 45–182)
SATURATION RATIOS: 11 % — AB (ref 17.9–39.5)
TIBC: 260 ug/dL (ref 250–450)
UIBC: 231 ug/dL

## 2017-02-25 LAB — POCT HEMOGLOBIN-HEMACUE: HEMOGLOBIN: 11.6 g/dL — AB (ref 13.0–17.0)

## 2017-02-25 LAB — FERRITIN: Ferritin: 420 ng/mL — ABNORMAL HIGH (ref 24–336)

## 2017-02-25 MED ORDER — EPOETIN ALFA 10000 UNIT/ML IJ SOLN
INTRAMUSCULAR | Status: AC
  Administered 2017-02-25: 5000 [IU]
  Filled 2017-02-25: qty 1

## 2017-02-25 MED ORDER — EPOETIN ALFA 10000 UNIT/ML IJ SOLN: 5000.0000 [IU] | INTRAMUSCULAR | Status: DC

## 2017-03-03 ENCOUNTER — Ambulatory Visit (HOSPITAL_COMMUNITY)
Admission: RE | Admit: 2017-03-03 | Discharge: 2017-03-03 | Disposition: A | Discharge status: Home / Self Care | Payer: Medicare Other | Source: Ambulatory Visit | Attending: Nephrology | Admitting: Nephrology

## 2017-03-03 VITALS — BP 179/57 | HR 51 | Temp 97.9°F | Resp 20

## 2017-03-03 DIAGNOSIS — Z23 Encounter for immunization: Secondary | ICD-10-CM | POA: Diagnosis not present

## 2017-03-03 DIAGNOSIS — I130 Hypertensive heart and chronic kidney disease with heart failure and stage 1 through stage 4 chronic kidney disease, or unspecified chronic kidney disease: Secondary | ICD-10-CM

## 2017-03-03 DIAGNOSIS — N183 Chronic kidney disease, stage 3 (moderate): Secondary | ICD-10-CM | POA: Insufficient documentation

## 2017-03-03 DIAGNOSIS — D631 Anemia in chronic kidney disease: Secondary | ICD-10-CM | POA: Diagnosis not present

## 2017-03-03 LAB — POCT HEMOGLOBIN-HEMACUE: Hemoglobin: 10.7 g/dL — ABNORMAL LOW (ref 13.0–17.0)

## 2017-03-03 MED ORDER — EPOETIN ALFA 10000 UNIT/ML IJ SOLN
5000.0000 [IU] | INTRAMUSCULAR | Status: DC
  Administered 2017-03-03: 09:00:00 5000 [IU] via SUBCUTANEOUS

## 2017-03-03 MED ORDER — EPOETIN ALFA 10000 UNIT/ML IJ SOLN
INTRAMUSCULAR | Status: AC
  Filled 2017-03-03: qty 1

## 2017-03-10 ENCOUNTER — Ambulatory Visit (HOSPITAL_COMMUNITY)
Admission: RE | Admit: 2017-03-10 | Discharge: 2017-03-10 | Disposition: A | Discharge status: Home / Self Care | Payer: Medicare Other | Source: Ambulatory Visit | Attending: Nephrology | Admitting: Nephrology

## 2017-03-10 VITALS — BP 170/53 | HR 54 | Temp 97.6°F | Resp 20

## 2017-03-10 DIAGNOSIS — I130 Hypertensive heart and chronic kidney disease with heart failure and stage 1 through stage 4 chronic kidney disease, or unspecified chronic kidney disease: Secondary | ICD-10-CM | POA: Insufficient documentation

## 2017-03-10 LAB — POCT HEMOGLOBIN-HEMACUE: HEMOGLOBIN: 12 g/dL — AB (ref 13.0–17.0)

## 2017-03-10 MED ORDER — EPOETIN ALFA 10000 UNIT/ML IJ SOLN
INTRAMUSCULAR | Status: AC
  Filled 2017-03-10: qty 1

## 2017-03-10 MED ORDER — EPOETIN ALFA 10000 UNIT/ML IJ SOLN: 5000.0000 [IU] | INTRAMUSCULAR | Status: DC

## 2017-03-16 DIAGNOSIS — H401113 Primary open-angle glaucoma, right eye, severe stage: Secondary | ICD-10-CM | POA: Diagnosis not present

## 2017-03-16 DIAGNOSIS — H401121 Primary open-angle glaucoma, left eye, mild stage: Secondary | ICD-10-CM | POA: Diagnosis not present

## 2017-03-17 DIAGNOSIS — N400 Enlarged prostate without lower urinary tract symptoms: Secondary | ICD-10-CM | POA: Diagnosis not present

## 2017-03-17 DIAGNOSIS — N2581 Secondary hyperparathyroidism of renal origin: Secondary | ICD-10-CM | POA: Diagnosis not present

## 2017-03-17 DIAGNOSIS — N183 Chronic kidney disease, stage 3 (moderate): Secondary | ICD-10-CM | POA: Diagnosis not present

## 2017-03-17 DIAGNOSIS — Z9889 Other specified postprocedural states: Secondary | ICD-10-CM | POA: Diagnosis not present

## 2017-03-17 DIAGNOSIS — D631 Anemia in chronic kidney disease: Secondary | ICD-10-CM | POA: Diagnosis not present

## 2017-03-17 DIAGNOSIS — I2510 Atherosclerotic heart disease of native coronary artery without angina pectoris: Secondary | ICD-10-CM | POA: Diagnosis not present

## 2017-03-17 DIAGNOSIS — Z8719 Personal history of other diseases of the digestive system: Secondary | ICD-10-CM | POA: Diagnosis not present

## 2017-03-17 DIAGNOSIS — I129 Hypertensive chronic kidney disease with stage 1 through stage 4 chronic kidney disease, or unspecified chronic kidney disease: Secondary | ICD-10-CM | POA: Diagnosis not present

## 2017-03-24 ENCOUNTER — Ambulatory Visit (HOSPITAL_COMMUNITY)
Admission: RE | Admit: 2017-03-24 | Discharge: 2017-03-24 | Disposition: A | Discharge status: Home / Self Care | Payer: Medicare Other | Source: Ambulatory Visit | Attending: Nephrology | Admitting: Nephrology

## 2017-03-24 VITALS — BP 180/68 | HR 44 | Temp 97.6°F | Resp 20

## 2017-03-24 DIAGNOSIS — D631 Anemia in chronic kidney disease: Secondary | ICD-10-CM | POA: Diagnosis not present

## 2017-03-24 DIAGNOSIS — N189 Chronic kidney disease, unspecified: Secondary | ICD-10-CM | POA: Diagnosis not present

## 2017-03-24 DIAGNOSIS — I130 Hypertensive heart and chronic kidney disease with heart failure and stage 1 through stage 4 chronic kidney disease, or unspecified chronic kidney disease: Secondary | ICD-10-CM

## 2017-03-24 LAB — POCT HEMOGLOBIN-HEMACUE: Hemoglobin: 11.3 g/dL — ABNORMAL LOW (ref 13.0–17.0)

## 2017-03-24 LAB — IRON AND TIBC
IRON: 74 ug/dL (ref 45–182)
SATURATION RATIOS: 25 % (ref 17.9–39.5)
TIBC: 294 ug/dL (ref 250–450)
UIBC: 220 ug/dL

## 2017-03-24 LAB — FERRITIN: FERRITIN: 296 ng/mL (ref 24–336)

## 2017-03-24 MED ORDER — EPOETIN ALFA 10000 UNIT/ML IJ SOLN
INTRAMUSCULAR | Status: AC
  Administered 2017-03-24: 09:00:00 5000 [IU] via SUBCUTANEOUS
  Filled 2017-03-24: qty 1

## 2017-03-24 MED ORDER — EPOETIN ALFA 10000 UNIT/ML IJ SOLN
5000.0000 [IU] | INTRAMUSCULAR | Status: DC
  Administered 2017-03-24: 5000 [IU] via SUBCUTANEOUS

## 2017-03-31 ENCOUNTER — Ambulatory Visit (HOSPITAL_COMMUNITY)
Admission: RE | Admit: 2017-03-31 | Discharge: 2017-03-31 | Disposition: A | Discharge status: Home / Self Care | Payer: Medicare Other | Source: Ambulatory Visit | Attending: Nephrology | Admitting: Nephrology

## 2017-03-31 VITALS — BP 179/60 | HR 51 | Temp 97.6°F | Resp 20

## 2017-03-31 DIAGNOSIS — I130 Hypertensive heart and chronic kidney disease with heart failure and stage 1 through stage 4 chronic kidney disease, or unspecified chronic kidney disease: Secondary | ICD-10-CM | POA: Diagnosis not present

## 2017-03-31 LAB — POCT HEMOGLOBIN-HEMACUE: Hemoglobin: 11.3 g/dL — ABNORMAL LOW (ref 13.0–17.0)

## 2017-03-31 MED ORDER — EPOETIN ALFA 10000 UNIT/ML IJ SOLN: 5000.0000 [IU] | INTRAMUSCULAR | Status: DC

## 2017-03-31 MED ORDER — EPOETIN ALFA 10000 UNIT/ML IJ SOLN
INTRAMUSCULAR | Status: AC
  Administered 2017-03-31: 09:00:00 5000 [IU]
  Filled 2017-03-31: qty 1

## 2017-04-07 ENCOUNTER — Encounter (HOSPITAL_COMMUNITY)
Admission: RE | Admit: 2017-04-07 | Discharge: 2017-04-07 | Disposition: A | Discharge status: Home / Self Care | Payer: Medicare Other | Source: Ambulatory Visit | Attending: Nephrology | Admitting: Nephrology

## 2017-04-07 VITALS — BP 171/60 | HR 45 | Temp 97.9°F | Resp 20

## 2017-04-07 DIAGNOSIS — I130 Hypertensive heart and chronic kidney disease with heart failure and stage 1 through stage 4 chronic kidney disease, or unspecified chronic kidney disease: Secondary | ICD-10-CM | POA: Insufficient documentation

## 2017-04-07 LAB — POCT HEMOGLOBIN-HEMACUE: Hemoglobin: 11.9 g/dL — ABNORMAL LOW (ref 13.0–17.0)

## 2017-04-07 MED ORDER — EPOETIN ALFA 10000 UNIT/ML IJ SOLN
INTRAMUSCULAR | Status: AC
  Filled 2017-04-07: qty 1

## 2017-04-07 MED ORDER — EPOETIN ALFA 10000 UNIT/ML IJ SOLN
5000.0000 [IU] | INTRAMUSCULAR | Status: DC
  Administered 2017-04-07: 5000 [IU] via SUBCUTANEOUS

## 2017-04-14 ENCOUNTER — Ambulatory Visit (HOSPITAL_COMMUNITY)
Admission: RE | Admit: 2017-04-14 | Discharge: 2017-04-14 | Disposition: A | Discharge status: Home / Self Care | Payer: Medicare Other | Source: Ambulatory Visit | Attending: Nephrology | Admitting: Nephrology

## 2017-04-14 VITALS — BP 184/72 | HR 42 | Temp 97.7°F | Resp 20

## 2017-04-14 DIAGNOSIS — I130 Hypertensive heart and chronic kidney disease with heart failure and stage 1 through stage 4 chronic kidney disease, or unspecified chronic kidney disease: Secondary | ICD-10-CM | POA: Diagnosis not present

## 2017-04-14 DIAGNOSIS — I509 Heart failure, unspecified: Secondary | ICD-10-CM | POA: Diagnosis not present

## 2017-04-14 DIAGNOSIS — N189 Chronic kidney disease, unspecified: Secondary | ICD-10-CM | POA: Insufficient documentation

## 2017-04-14 LAB — POCT HEMOGLOBIN-HEMACUE: HEMOGLOBIN: 12.3 g/dL — AB (ref 13.0–17.0)

## 2017-04-14 MED ORDER — EPOETIN ALFA 10000 UNIT/ML IJ SOLN: 5000.0000 [IU] | INTRAMUSCULAR | Status: DC

## 2017-04-27 ENCOUNTER — Other Ambulatory Visit (HOSPITAL_COMMUNITY): Payer: Self-pay | Admitting: *Deleted

## 2017-04-28 ENCOUNTER — Ambulatory Visit (HOSPITAL_COMMUNITY)
Admission: RE | Admit: 2017-04-28 | Discharge: 2017-04-28 | Disposition: A | Discharge status: Home / Self Care | Payer: Medicare Other | Source: Ambulatory Visit | Attending: Nephrology | Admitting: Nephrology

## 2017-04-28 VITALS — BP 174/54 | HR 42 | Temp 97.8°F | Resp 20

## 2017-04-28 DIAGNOSIS — D631 Anemia in chronic kidney disease: Secondary | ICD-10-CM | POA: Insufficient documentation

## 2017-04-28 DIAGNOSIS — N183 Chronic kidney disease, stage 3 (moderate): Secondary | ICD-10-CM | POA: Diagnosis not present

## 2017-04-28 DIAGNOSIS — I130 Hypertensive heart and chronic kidney disease with heart failure and stage 1 through stage 4 chronic kidney disease, or unspecified chronic kidney disease: Secondary | ICD-10-CM

## 2017-04-28 LAB — FERRITIN: FERRITIN: 255 ng/mL (ref 24–336)

## 2017-04-28 LAB — IRON AND TIBC
Iron: 71 ug/dL (ref 45–182)
Saturation Ratios: 29 % (ref 17.9–39.5)
TIBC: 245 ug/dL — ABNORMAL LOW (ref 250–450)
UIBC: 174 ug/dL

## 2017-04-28 LAB — POCT HEMOGLOBIN-HEMACUE: Hemoglobin: 11.4 g/dL — ABNORMAL LOW (ref 13.0–17.0)

## 2017-04-28 MED ORDER — EPOETIN ALFA 10000 UNIT/ML IJ SOLN
INTRAMUSCULAR | Status: AC
  Filled 2017-04-28: qty 1

## 2017-04-28 MED ORDER — EPOETIN ALFA 10000 UNIT/ML IJ SOLN
5000.0000 [IU] | INTRAMUSCULAR | Status: DC
  Administered 2017-04-28: 08:00:00 5000 [IU] via SUBCUTANEOUS

## 2017-05-05 ENCOUNTER — Ambulatory Visit (HOSPITAL_COMMUNITY)
Admission: RE | Admit: 2017-05-05 | Discharge: 2017-05-05 | Disposition: A | Discharge status: Home / Self Care | Payer: Medicare Other | Source: Ambulatory Visit | Attending: Nephrology | Admitting: Nephrology

## 2017-05-05 VITALS — BP 171/68 | HR 42 | Temp 97.4°F | Resp 20

## 2017-05-05 DIAGNOSIS — D631 Anemia in chronic kidney disease: Secondary | ICD-10-CM | POA: Diagnosis not present

## 2017-05-05 DIAGNOSIS — N183 Chronic kidney disease, stage 3 (moderate): Secondary | ICD-10-CM | POA: Diagnosis not present

## 2017-05-05 DIAGNOSIS — I130 Hypertensive heart and chronic kidney disease with heart failure and stage 1 through stage 4 chronic kidney disease, or unspecified chronic kidney disease: Secondary | ICD-10-CM

## 2017-05-05 LAB — POCT HEMOGLOBIN-HEMACUE: HEMOGLOBIN: 11.7 g/dL — AB (ref 13.0–17.0)

## 2017-05-05 MED ORDER — EPOETIN ALFA 10000 UNIT/ML IJ SOLN
5000.0000 [IU] | INTRAMUSCULAR | Status: DC
  Administered 2017-05-05: 5000 [IU] via SUBCUTANEOUS

## 2017-05-05 MED ORDER — EPOETIN ALFA 10000 UNIT/ML IJ SOLN
INTRAMUSCULAR | Status: AC
  Administered 2017-05-05: 5000 [IU] via SUBCUTANEOUS
  Filled 2017-05-05: qty 1

## 2017-05-09 DIAGNOSIS — Z1389 Encounter for screening for other disorder: Secondary | ICD-10-CM | POA: Diagnosis not present

## 2017-05-09 DIAGNOSIS — J449 Chronic obstructive pulmonary disease, unspecified: Secondary | ICD-10-CM | POA: Diagnosis not present

## 2017-05-09 DIAGNOSIS — E785 Hyperlipidemia, unspecified: Secondary | ICD-10-CM | POA: Diagnosis not present

## 2017-05-09 DIAGNOSIS — E0500 Thyrotoxicosis with diffuse goiter without thyrotoxic crisis or storm: Secondary | ICD-10-CM | POA: Diagnosis not present

## 2017-05-09 DIAGNOSIS — E119 Type 2 diabetes mellitus without complications: Secondary | ICD-10-CM | POA: Diagnosis not present

## 2017-05-09 DIAGNOSIS — D509 Iron deficiency anemia, unspecified: Secondary | ICD-10-CM | POA: Diagnosis not present

## 2017-05-09 DIAGNOSIS — N183 Chronic kidney disease, stage 3 (moderate): Secondary | ICD-10-CM | POA: Diagnosis not present

## 2017-05-09 DIAGNOSIS — Z0000 Encounter for general adult medical examination without abnormal findings: Secondary | ICD-10-CM | POA: Diagnosis not present

## 2017-05-09 DIAGNOSIS — I2510 Atherosclerotic heart disease of native coronary artery without angina pectoris: Secondary | ICD-10-CM | POA: Diagnosis not present

## 2017-05-09 DIAGNOSIS — I10 Essential (primary) hypertension: Secondary | ICD-10-CM | POA: Diagnosis not present

## 2017-05-12 ENCOUNTER — Telehealth: Payer: Self-pay

## 2017-05-12 ENCOUNTER — Encounter (HOSPITAL_COMMUNITY)
Admission: RE | Admit: 2017-05-12 | Discharge: 2017-05-12 | Disposition: A | Discharge status: Home / Self Care | Payer: Medicare Other | Source: Ambulatory Visit | Attending: Nephrology | Admitting: Nephrology

## 2017-05-12 VITALS — BP 131/54 | HR 45 | Temp 97.6°F | Resp 20

## 2017-05-12 DIAGNOSIS — E7841 Elevated Lipoprotein(a): Secondary | ICD-10-CM

## 2017-05-12 DIAGNOSIS — I130 Hypertensive heart and chronic kidney disease with heart failure and stage 1 through stage 4 chronic kidney disease, or unspecified chronic kidney disease: Secondary | ICD-10-CM

## 2017-05-12 LAB — POCT HEMOGLOBIN-HEMACUE: HEMOGLOBIN: 11.5 g/dL — AB (ref 13.0–17.0)

## 2017-05-12 MED ORDER — EPOETIN ALFA 10000 UNIT/ML IJ SOLN
5000.0000 [IU] | INTRAMUSCULAR | Status: DC
  Administered 2017-05-12: 5000 [IU] via SUBCUTANEOUS

## 2017-05-12 MED ORDER — ATORVASTATIN CALCIUM 20 MG PO TABS: 20.0000 mg | ORAL_TABLET | Freq: Every day | ORAL | 0 refills | Status: DC

## 2017-05-12 MED ORDER — EPOETIN ALFA 10000 UNIT/ML IJ SOLN
INTRAMUSCULAR | Status: AC
  Administered 2017-05-12: 5000 [IU] via SUBCUTANEOUS
  Filled 2017-05-12: qty 1

## 2017-05-12 NOTE — Telephone Encounter (Signed)
Notes recorded by Teressa Senter, RN on 05/12/2017 at 1:00 PM EST Patient made aware of results. Patient states he is taking atorvastatin 10 mg once a day. Per Dr. Radford Pax will increase to 20 mg once day and repeat lab on 06/27/17. Patient in agreement with plan and thanked me for the call   Notes recorded by Sueanne Margarita, MD on 05/11/2017 at 10:33 AM EST LDL not at goal - increase Crestor to 20mg  daily and repeat FLP and ALT in 6 weeks

## 2017-05-19 ENCOUNTER — Ambulatory Visit (HOSPITAL_COMMUNITY)
Admission: RE | Admit: 2017-05-19 | Discharge: 2017-05-19 | Disposition: A | Discharge status: Home / Self Care | Payer: Medicare Other | Source: Ambulatory Visit | Attending: Nephrology | Admitting: Nephrology

## 2017-05-19 VITALS — BP 98/76 | HR 53 | Temp 97.7°F | Resp 18

## 2017-05-19 DIAGNOSIS — D631 Anemia in chronic kidney disease: Secondary | ICD-10-CM | POA: Diagnosis not present

## 2017-05-19 DIAGNOSIS — Z5181 Encounter for therapeutic drug level monitoring: Secondary | ICD-10-CM | POA: Diagnosis not present

## 2017-05-19 DIAGNOSIS — I130 Hypertensive heart and chronic kidney disease with heart failure and stage 1 through stage 4 chronic kidney disease, or unspecified chronic kidney disease: Secondary | ICD-10-CM

## 2017-05-19 DIAGNOSIS — Z79899 Other long term (current) drug therapy: Secondary | ICD-10-CM | POA: Diagnosis not present

## 2017-05-19 DIAGNOSIS — N183 Chronic kidney disease, stage 3 (moderate): Secondary | ICD-10-CM | POA: Diagnosis not present

## 2017-05-19 LAB — POCT HEMOGLOBIN-HEMACUE: Hemoglobin: 11.9 g/dL — ABNORMAL LOW (ref 13.0–17.0)

## 2017-05-19 MED ORDER — CLONIDINE HCL 0.1 MG PO TABS
0.1000 mg | ORAL_TABLET | Freq: Once | ORAL | Status: AC | PRN
  Administered 2017-05-19: 09:00:00 0.1 mg via ORAL

## 2017-05-19 MED ORDER — EPOETIN ALFA 10000 UNIT/ML IJ SOLN
INTRAMUSCULAR | Status: AC
  Administered 2017-05-19: 10:00:00 5000 [IU] via SUBCUTANEOUS
  Filled 2017-05-19: qty 1

## 2017-05-19 MED ORDER — EPOETIN ALFA 10000 UNIT/ML IJ SOLN
5000.0000 [IU] | INTRAMUSCULAR | Status: DC
  Administered 2017-05-19: 5000 [IU] via SUBCUTANEOUS

## 2017-05-19 MED ORDER — CLONIDINE HCL 0.1 MG PO TABS
ORAL_TABLET | ORAL | Status: AC
  Filled 2017-05-19: qty 1

## 2017-05-19 NOTE — Progress Notes (Signed)
Initial BP too high for Procrit injection after 3 attempts.  Clonidine 0.1 given.  BP rechecked and in range for injection.  Pt encouraged to take home meds before appt time

## 2017-05-26 ENCOUNTER — Ambulatory Visit (HOSPITAL_COMMUNITY)
Admission: RE | Admit: 2017-05-26 | Discharge: 2017-05-26 | Disposition: A | Discharge status: Home / Self Care | Payer: Medicare Other | Source: Ambulatory Visit | Attending: Nephrology | Admitting: Nephrology

## 2017-05-26 VITALS — BP 157/69 | HR 42 | Temp 97.6°F | Resp 18

## 2017-05-26 DIAGNOSIS — N183 Chronic kidney disease, stage 3 (moderate): Secondary | ICD-10-CM | POA: Diagnosis not present

## 2017-05-26 DIAGNOSIS — D631 Anemia in chronic kidney disease: Secondary | ICD-10-CM | POA: Insufficient documentation

## 2017-05-26 DIAGNOSIS — I130 Hypertensive heart and chronic kidney disease with heart failure and stage 1 through stage 4 chronic kidney disease, or unspecified chronic kidney disease: Secondary | ICD-10-CM

## 2017-05-26 LAB — IRON AND TIBC
Iron: 58 ug/dL (ref 45–182)
Saturation Ratios: 23 % (ref 17.9–39.5)
TIBC: 248 ug/dL — ABNORMAL LOW (ref 250–450)
UIBC: 190 ug/dL

## 2017-05-26 LAB — FERRITIN: Ferritin: 200 ng/mL (ref 24–336)

## 2017-05-26 LAB — POCT HEMOGLOBIN-HEMACUE: HEMOGLOBIN: 11.6 g/dL — AB (ref 13.0–17.0)

## 2017-05-26 MED ORDER — EPOETIN ALFA 10000 UNIT/ML IJ SOLN
INTRAMUSCULAR | Status: AC
  Administered 2017-05-26: 09:00:00 5000 [IU] via SUBCUTANEOUS
  Filled 2017-05-26: qty 1

## 2017-05-26 MED ORDER — EPOETIN ALFA 10000 UNIT/ML IJ SOLN
5000.0000 [IU] | INTRAMUSCULAR | Status: DC
  Administered 2017-05-26: 5000 [IU] via SUBCUTANEOUS

## 2017-06-02 ENCOUNTER — Encounter (HOSPITAL_COMMUNITY)
Admission: RE | Admit: 2017-06-02 | Discharge: 2017-06-02 | Disposition: A | Discharge status: Home / Self Care | Payer: Medicare Other | Source: Ambulatory Visit | Attending: Nephrology | Admitting: Nephrology

## 2017-06-02 VITALS — BP 165/56 | HR 44 | Temp 97.7°F | Resp 18

## 2017-06-02 DIAGNOSIS — I130 Hypertensive heart and chronic kidney disease with heart failure and stage 1 through stage 4 chronic kidney disease, or unspecified chronic kidney disease: Secondary | ICD-10-CM | POA: Diagnosis not present

## 2017-06-02 DIAGNOSIS — N189 Chronic kidney disease, unspecified: Secondary | ICD-10-CM | POA: Insufficient documentation

## 2017-06-02 LAB — POCT HEMOGLOBIN-HEMACUE: HEMOGLOBIN: 11.8 g/dL — AB (ref 13.0–17.0)

## 2017-06-02 MED ORDER — EPOETIN ALFA 10000 UNIT/ML IJ SOLN
5000.0000 [IU] | INTRAMUSCULAR | Status: DC
  Administered 2017-06-02: 08:00:00 5000 [IU] via SUBCUTANEOUS

## 2017-06-02 MED ORDER — EPOETIN ALFA 10000 UNIT/ML IJ SOLN
INTRAMUSCULAR | Status: AC
  Filled 2017-06-02: qty 1

## 2017-06-09 ENCOUNTER — Encounter (HOSPITAL_COMMUNITY)
Admission: RE | Admit: 2017-06-09 | Discharge: 2017-06-09 | Disposition: A | Discharge status: Home / Self Care | Payer: Medicare Other | Source: Ambulatory Visit | Attending: Nephrology | Admitting: Nephrology

## 2017-06-09 VITALS — BP 124/55 | HR 40 | Temp 98.2°F | Resp 20

## 2017-06-09 DIAGNOSIS — I130 Hypertensive heart and chronic kidney disease with heart failure and stage 1 through stage 4 chronic kidney disease, or unspecified chronic kidney disease: Secondary | ICD-10-CM | POA: Diagnosis not present

## 2017-06-09 DIAGNOSIS — N189 Chronic kidney disease, unspecified: Secondary | ICD-10-CM | POA: Diagnosis not present

## 2017-06-09 LAB — POCT HEMOGLOBIN-HEMACUE: Hemoglobin: 11.4 g/dL — ABNORMAL LOW (ref 13.0–17.0)

## 2017-06-09 MED ORDER — EPOETIN ALFA 10000 UNIT/ML IJ SOLN
5000.0000 [IU] | INTRAMUSCULAR | Status: DC
  Administered 2017-06-09: 08:00:00 5000 [IU] via SUBCUTANEOUS

## 2017-06-09 MED ORDER — EPOETIN ALFA 10000 UNIT/ML IJ SOLN
INTRAMUSCULAR | Status: AC
  Filled 2017-06-09: qty 1

## 2017-06-11 ENCOUNTER — Other Ambulatory Visit (HOSPITAL_COMMUNITY): Payer: Self-pay | Admitting: Cardiology

## 2017-06-15 DIAGNOSIS — Z9889 Other specified postprocedural states: Secondary | ICD-10-CM | POA: Diagnosis not present

## 2017-06-15 DIAGNOSIS — Z8719 Personal history of other diseases of the digestive system: Secondary | ICD-10-CM | POA: Diagnosis not present

## 2017-06-15 DIAGNOSIS — I129 Hypertensive chronic kidney disease with stage 1 through stage 4 chronic kidney disease, or unspecified chronic kidney disease: Secondary | ICD-10-CM | POA: Diagnosis not present

## 2017-06-15 DIAGNOSIS — D631 Anemia in chronic kidney disease: Secondary | ICD-10-CM | POA: Diagnosis not present

## 2017-06-15 DIAGNOSIS — I2510 Atherosclerotic heart disease of native coronary artery without angina pectoris: Secondary | ICD-10-CM | POA: Diagnosis not present

## 2017-06-15 DIAGNOSIS — N400 Enlarged prostate without lower urinary tract symptoms: Secondary | ICD-10-CM | POA: Diagnosis not present

## 2017-06-15 DIAGNOSIS — N2581 Secondary hyperparathyroidism of renal origin: Secondary | ICD-10-CM | POA: Diagnosis not present

## 2017-06-15 DIAGNOSIS — N183 Chronic kidney disease, stage 3 (moderate): Secondary | ICD-10-CM | POA: Diagnosis not present

## 2017-06-16 ENCOUNTER — Ambulatory Visit (HOSPITAL_COMMUNITY)
Admission: RE | Admit: 2017-06-16 | Discharge: 2017-06-16 | Disposition: A | Discharge status: Home / Self Care | Payer: Medicare Other | Source: Ambulatory Visit | Attending: Nephrology | Admitting: Nephrology

## 2017-06-16 VITALS — BP 176/56 | HR 40 | Temp 97.6°F | Resp 20

## 2017-06-16 DIAGNOSIS — I2510 Atherosclerotic heart disease of native coronary artery without angina pectoris: Secondary | ICD-10-CM | POA: Insufficient documentation

## 2017-06-16 DIAGNOSIS — N2581 Secondary hyperparathyroidism of renal origin: Secondary | ICD-10-CM | POA: Insufficient documentation

## 2017-06-16 DIAGNOSIS — N183 Chronic kidney disease, stage 3 (moderate): Secondary | ICD-10-CM | POA: Insufficient documentation

## 2017-06-16 DIAGNOSIS — I130 Hypertensive heart and chronic kidney disease with heart failure and stage 1 through stage 4 chronic kidney disease, or unspecified chronic kidney disease: Secondary | ICD-10-CM | POA: Diagnosis not present

## 2017-06-16 DIAGNOSIS — I5032 Chronic diastolic (congestive) heart failure: Secondary | ICD-10-CM | POA: Insufficient documentation

## 2017-06-16 DIAGNOSIS — D631 Anemia in chronic kidney disease: Secondary | ICD-10-CM | POA: Insufficient documentation

## 2017-06-16 LAB — POCT HEMOGLOBIN-HEMACUE: Hemoglobin: 11.9 g/dL — ABNORMAL LOW (ref 13.0–17.0)

## 2017-06-16 MED ORDER — EPOETIN ALFA 10000 UNIT/ML IJ SOLN
INTRAMUSCULAR | Status: AC
  Filled 2017-06-16: qty 1

## 2017-06-16 MED ORDER — EPOETIN ALFA 10000 UNIT/ML IJ SOLN
5000.0000 [IU] | INTRAMUSCULAR | Status: DC
  Administered 2017-06-16: 09:00:00 5000 [IU] via SUBCUTANEOUS

## 2017-06-23 ENCOUNTER — Ambulatory Visit (HOSPITAL_COMMUNITY)
Admission: RE | Admit: 2017-06-23 | Discharge: 2017-06-23 | Disposition: A | Discharge status: Home / Self Care | Payer: Medicare Other | Source: Ambulatory Visit | Attending: Nephrology | Admitting: Nephrology

## 2017-06-23 VITALS — BP 165/46 | HR 47 | Temp 97.7°F | Resp 20

## 2017-06-23 DIAGNOSIS — I130 Hypertensive heart and chronic kidney disease with heart failure and stage 1 through stage 4 chronic kidney disease, or unspecified chronic kidney disease: Secondary | ICD-10-CM | POA: Insufficient documentation

## 2017-06-23 DIAGNOSIS — N189 Chronic kidney disease, unspecified: Secondary | ICD-10-CM | POA: Diagnosis not present

## 2017-06-23 LAB — IRON AND TIBC
Iron: 75 ug/dL (ref 45–182)
SATURATION RATIOS: 30 % (ref 17.9–39.5)
TIBC: 248 ug/dL — AB (ref 250–450)
UIBC: 173 ug/dL

## 2017-06-23 LAB — POCT HEMOGLOBIN-HEMACUE: Hemoglobin: 12 g/dL — ABNORMAL LOW (ref 13.0–17.0)

## 2017-06-23 LAB — FERRITIN: FERRITIN: 194 ng/mL (ref 24–336)

## 2017-06-23 MED ORDER — EPOETIN ALFA 10000 UNIT/ML IJ SOLN: 5000.0000 [IU] | INTRAMUSCULAR | Status: DC

## 2017-06-27 ENCOUNTER — Other Ambulatory Visit: Payer: Medicare Other | Admitting: *Deleted

## 2017-06-27 DIAGNOSIS — E7841 Elevated Lipoprotein(a): Secondary | ICD-10-CM

## 2017-06-27 LAB — LIPID PANEL
CHOL/HDL RATIO: 2 ratio (ref 0.0–5.0)
Cholesterol, Total: 117 mg/dL (ref 100–199)
HDL: 60 mg/dL (ref >39)
LDL Calculated: 48 mg/dL (ref 0–99)
TRIGLYCERIDES: 45 mg/dL (ref 0–149)
VLDL Cholesterol Cal: 9 mg/dL (ref 5–40)

## 2017-06-27 LAB — ALT: ALT: 11 IU/L (ref 0–44)

## 2017-06-30 ENCOUNTER — Encounter (HOSPITAL_COMMUNITY): Payer: Medicare Other

## 2017-07-04 ENCOUNTER — Other Ambulatory Visit: Payer: Self-pay | Admitting: Cardiology

## 2017-07-04 DIAGNOSIS — I6523 Occlusion and stenosis of bilateral carotid arteries: Secondary | ICD-10-CM

## 2017-07-07 ENCOUNTER — Ambulatory Visit (HOSPITAL_COMMUNITY)
Admission: RE | Admit: 2017-07-07 | Discharge: 2017-07-07 | Disposition: A | Discharge status: Home / Self Care | Payer: Medicare Other | Source: Ambulatory Visit | Attending: Nephrology | Admitting: Nephrology

## 2017-07-07 VITALS — BP 171/45 | HR 46 | Temp 97.8°F | Resp 20

## 2017-07-07 DIAGNOSIS — N183 Chronic kidney disease, stage 3 (moderate): Secondary | ICD-10-CM | POA: Diagnosis not present

## 2017-07-07 DIAGNOSIS — I130 Hypertensive heart and chronic kidney disease with heart failure and stage 1 through stage 4 chronic kidney disease, or unspecified chronic kidney disease: Secondary | ICD-10-CM

## 2017-07-07 DIAGNOSIS — D631 Anemia in chronic kidney disease: Secondary | ICD-10-CM | POA: Insufficient documentation

## 2017-07-07 LAB — POCT HEMOGLOBIN-HEMACUE: Hemoglobin: 11.4 g/dL — ABNORMAL LOW (ref 13.0–17.0)

## 2017-07-07 MED ORDER — EPOETIN ALFA 10000 UNIT/ML IJ SOLN
5000.0000 [IU] | INTRAMUSCULAR | Status: DC
  Administered 2017-07-07: 5000 [IU] via SUBCUTANEOUS

## 2017-07-07 MED ORDER — EPOETIN ALFA 10000 UNIT/ML IJ SOLN
INTRAMUSCULAR | Status: AC
  Administered 2017-07-07: 5000 [IU] via SUBCUTANEOUS
  Filled 2017-07-07: qty 1

## 2017-07-09 ENCOUNTER — Other Ambulatory Visit (HOSPITAL_COMMUNITY): Payer: Self-pay | Admitting: Cardiology

## 2017-07-11 ENCOUNTER — Ambulatory Visit (HOSPITAL_COMMUNITY)
Admission: RE | Admit: 2017-07-11 | Discharge: 2017-07-11 | Disposition: A | Discharge status: Home / Self Care | Payer: Medicare Other | Source: Ambulatory Visit | Attending: Cardiovascular Disease | Admitting: Cardiovascular Disease

## 2017-07-11 DIAGNOSIS — I6523 Occlusion and stenosis of bilateral carotid arteries: Secondary | ICD-10-CM | POA: Diagnosis not present

## 2017-07-13 ENCOUNTER — Encounter: Payer: Self-pay | Admitting: Cardiology

## 2017-07-14 ENCOUNTER — Ambulatory Visit (HOSPITAL_COMMUNITY)
Admission: RE | Admit: 2017-07-14 | Discharge: 2017-07-14 | Disposition: A | Discharge status: Home / Self Care | Payer: Medicare Other | Source: Ambulatory Visit | Attending: Nephrology | Admitting: Nephrology

## 2017-07-14 VITALS — BP 116/48 | HR 41 | Temp 97.5°F | Resp 20

## 2017-07-14 DIAGNOSIS — I130 Hypertensive heart and chronic kidney disease with heart failure and stage 1 through stage 4 chronic kidney disease, or unspecified chronic kidney disease: Secondary | ICD-10-CM

## 2017-07-14 DIAGNOSIS — N183 Chronic kidney disease, stage 3 (moderate): Secondary | ICD-10-CM | POA: Insufficient documentation

## 2017-07-14 DIAGNOSIS — D631 Anemia in chronic kidney disease: Secondary | ICD-10-CM | POA: Diagnosis not present

## 2017-07-14 LAB — POCT HEMOGLOBIN-HEMACUE: Hemoglobin: 11.7 g/dL — ABNORMAL LOW (ref 13.0–17.0)

## 2017-07-14 MED ORDER — EPOETIN ALFA 10000 UNIT/ML IJ SOLN
INTRAMUSCULAR | Status: AC
  Filled 2017-07-14: qty 1

## 2017-07-14 MED ORDER — EPOETIN ALFA 10000 UNIT/ML IJ SOLN
5000.0000 [IU] | INTRAMUSCULAR | Status: DC
  Administered 2017-07-14: 5000 [IU] via SUBCUTANEOUS

## 2017-07-21 ENCOUNTER — Ambulatory Visit (HOSPITAL_COMMUNITY)
Admission: RE | Admit: 2017-07-21 | Discharge: 2017-07-21 | Disposition: A | Discharge status: Home / Self Care | Payer: Medicare Other | Source: Ambulatory Visit | Attending: Nephrology | Admitting: Nephrology

## 2017-07-21 VITALS — BP 150/54 | HR 39 | Temp 98.0°F | Resp 20

## 2017-07-21 DIAGNOSIS — I130 Hypertensive heart and chronic kidney disease with heart failure and stage 1 through stage 4 chronic kidney disease, or unspecified chronic kidney disease: Secondary | ICD-10-CM

## 2017-07-21 DIAGNOSIS — D631 Anemia in chronic kidney disease: Secondary | ICD-10-CM | POA: Diagnosis not present

## 2017-07-21 DIAGNOSIS — N183 Chronic kidney disease, stage 3 (moderate): Secondary | ICD-10-CM | POA: Insufficient documentation

## 2017-07-21 LAB — IRON AND TIBC
IRON: 63 ug/dL (ref 45–182)
Saturation Ratios: 26 % (ref 17.9–39.5)
TIBC: 238 ug/dL — AB (ref 250–450)
UIBC: 175 ug/dL

## 2017-07-21 LAB — FERRITIN: FERRITIN: 194 ng/mL (ref 24–336)

## 2017-07-21 LAB — POCT HEMOGLOBIN-HEMACUE: Hemoglobin: 12.4 g/dL — ABNORMAL LOW (ref 13.0–17.0)

## 2017-07-21 MED ORDER — EPOETIN ALFA 10000 UNIT/ML IJ SOLN: 5000.0000 [IU] | INTRAMUSCULAR | Status: DC

## 2017-07-23 ENCOUNTER — Other Ambulatory Visit (HOSPITAL_COMMUNITY): Payer: Self-pay | Admitting: Cardiology

## 2017-07-28 ENCOUNTER — Encounter (HOSPITAL_COMMUNITY): Payer: Medicare Other

## 2017-08-02 ENCOUNTER — Other Ambulatory Visit: Payer: Self-pay | Admitting: Cardiology

## 2017-08-04 ENCOUNTER — Ambulatory Visit (HOSPITAL_COMMUNITY)
Admission: RE | Admit: 2017-08-04 | Discharge: 2017-08-04 | Disposition: A | Discharge status: Home / Self Care | Payer: Medicare Other | Source: Ambulatory Visit | Attending: Nephrology | Admitting: Nephrology

## 2017-08-04 VITALS — BP 159/57 | HR 41 | Resp 20

## 2017-08-04 DIAGNOSIS — D631 Anemia in chronic kidney disease: Secondary | ICD-10-CM | POA: Insufficient documentation

## 2017-08-04 DIAGNOSIS — N183 Chronic kidney disease, stage 3 (moderate): Secondary | ICD-10-CM | POA: Insufficient documentation

## 2017-08-04 DIAGNOSIS — I130 Hypertensive heart and chronic kidney disease with heart failure and stage 1 through stage 4 chronic kidney disease, or unspecified chronic kidney disease: Secondary | ICD-10-CM

## 2017-08-04 LAB — POCT HEMOGLOBIN-HEMACUE: Hemoglobin: 11.5 g/dL — ABNORMAL LOW (ref 13.0–17.0)

## 2017-08-04 MED ORDER — EPOETIN ALFA 10000 UNIT/ML IJ SOLN
INTRAMUSCULAR | Status: AC
  Filled 2017-08-04: qty 1

## 2017-08-04 MED ORDER — EPOETIN ALFA 10000 UNIT/ML IJ SOLN
5000.0000 [IU] | INTRAMUSCULAR | Status: DC
  Administered 2017-08-04: 5000 [IU] via SUBCUTANEOUS

## 2017-08-10 ENCOUNTER — Encounter (HOSPITAL_COMMUNITY): Payer: Medicare Other

## 2017-08-11 ENCOUNTER — Ambulatory Visit (HOSPITAL_COMMUNITY)
Admission: RE | Admit: 2017-08-11 | Discharge: 2017-08-11 | Disposition: A | Discharge status: Home / Self Care | Payer: Medicare Other | Source: Ambulatory Visit | Attending: Nephrology | Admitting: Nephrology

## 2017-08-11 VITALS — BP 166/57 | HR 44 | Temp 97.6°F | Resp 20

## 2017-08-11 DIAGNOSIS — Z5181 Encounter for therapeutic drug level monitoring: Secondary | ICD-10-CM | POA: Diagnosis not present

## 2017-08-11 DIAGNOSIS — D631 Anemia in chronic kidney disease: Secondary | ICD-10-CM | POA: Diagnosis not present

## 2017-08-11 DIAGNOSIS — I130 Hypertensive heart and chronic kidney disease with heart failure and stage 1 through stage 4 chronic kidney disease, or unspecified chronic kidney disease: Secondary | ICD-10-CM

## 2017-08-11 DIAGNOSIS — N183 Chronic kidney disease, stage 3 (moderate): Secondary | ICD-10-CM | POA: Diagnosis not present

## 2017-08-11 DIAGNOSIS — Z79899 Other long term (current) drug therapy: Secondary | ICD-10-CM | POA: Diagnosis not present

## 2017-08-11 LAB — POCT HEMOGLOBIN-HEMACUE: Hemoglobin: 10.3 g/dL — ABNORMAL LOW (ref 13.0–17.0)

## 2017-08-11 MED ORDER — EPOETIN ALFA 10000 UNIT/ML IJ SOLN
INTRAMUSCULAR | Status: AC
  Filled 2017-08-11: qty 1

## 2017-08-11 MED ORDER — EPOETIN ALFA 10000 UNIT/ML IJ SOLN
5000.0000 [IU] | INTRAMUSCULAR | Status: DC
  Administered 2017-08-11: 5000 [IU] via SUBCUTANEOUS

## 2017-08-18 ENCOUNTER — Ambulatory Visit (HOSPITAL_COMMUNITY)
Admission: RE | Admit: 2017-08-18 | Discharge: 2017-08-18 | Disposition: A | Discharge status: Home / Self Care | Payer: Medicare Other | Source: Ambulatory Visit | Attending: Nephrology | Admitting: Nephrology

## 2017-08-18 VITALS — BP 165/52 | HR 42 | Temp 97.6°F | Resp 20

## 2017-08-18 DIAGNOSIS — I130 Hypertensive heart and chronic kidney disease with heart failure and stage 1 through stage 4 chronic kidney disease, or unspecified chronic kidney disease: Secondary | ICD-10-CM | POA: Diagnosis not present

## 2017-08-18 LAB — IRON AND TIBC
Iron: 57 ug/dL (ref 45–182)
Saturation Ratios: 24 % (ref 17.9–39.5)
TIBC: 242 ug/dL — ABNORMAL LOW (ref 250–450)
UIBC: 185 ug/dL

## 2017-08-18 LAB — POCT HEMOGLOBIN-HEMACUE: HEMOGLOBIN: 11.7 g/dL — AB (ref 13.0–17.0)

## 2017-08-18 LAB — FERRITIN: Ferritin: 199 ng/mL (ref 24–336)

## 2017-08-18 MED ORDER — EPOETIN ALFA 10000 UNIT/ML IJ SOLN
INTRAMUSCULAR | Status: AC
  Administered 2017-08-18: 5000 [IU] via SUBCUTANEOUS
  Filled 2017-08-18: qty 1

## 2017-08-18 MED ORDER — EPOETIN ALFA 10000 UNIT/ML IJ SOLN
5000.0000 [IU] | INTRAMUSCULAR | Status: DC
  Administered 2017-08-18: 5000 [IU] via SUBCUTANEOUS

## 2017-08-25 ENCOUNTER — Ambulatory Visit (HOSPITAL_COMMUNITY)
Admission: RE | Admit: 2017-08-25 | Discharge: 2017-08-25 | Disposition: A | Discharge status: Home / Self Care | Payer: Medicare Other | Source: Ambulatory Visit | Attending: Nephrology | Admitting: Nephrology

## 2017-08-25 VITALS — BP 155/54 | HR 42 | Temp 97.6°F | Resp 20

## 2017-08-25 DIAGNOSIS — I130 Hypertensive heart and chronic kidney disease with heart failure and stage 1 through stage 4 chronic kidney disease, or unspecified chronic kidney disease: Secondary | ICD-10-CM | POA: Diagnosis not present

## 2017-08-25 DIAGNOSIS — N189 Chronic kidney disease, unspecified: Secondary | ICD-10-CM | POA: Insufficient documentation

## 2017-08-25 LAB — POCT HEMOGLOBIN-HEMACUE: HEMOGLOBIN: 11.2 g/dL — AB (ref 13.0–17.0)

## 2017-08-25 MED ORDER — EPOETIN ALFA 10000 UNIT/ML IJ SOLN
INTRAMUSCULAR | Status: AC
  Filled 2017-08-25: qty 1

## 2017-08-25 MED ORDER — EPOETIN ALFA 10000 UNIT/ML IJ SOLN
5000.0000 [IU] | INTRAMUSCULAR | Status: DC
  Administered 2017-08-25: 5000 [IU] via SUBCUTANEOUS

## 2017-08-30 ENCOUNTER — Ambulatory Visit (INDEPENDENT_AMBULATORY_CARE_PROVIDER_SITE_OTHER): Payer: Medicare Other | Admitting: Cardiology

## 2017-08-30 ENCOUNTER — Other Ambulatory Visit: Payer: Self-pay

## 2017-08-30 ENCOUNTER — Encounter: Payer: Self-pay | Admitting: Cardiology

## 2017-08-30 ENCOUNTER — Ambulatory Visit (HOSPITAL_COMMUNITY): Payer: Medicare Other | Attending: Internal Medicine

## 2017-08-30 VITALS — BP 156/60 | HR 43 | Ht 69.0 in | Wt 134.0 lb

## 2017-08-30 DIAGNOSIS — N184 Chronic kidney disease, stage 4 (severe): Secondary | ICD-10-CM

## 2017-08-30 DIAGNOSIS — I2510 Atherosclerotic heart disease of native coronary artery without angina pectoris: Secondary | ICD-10-CM | POA: Diagnosis not present

## 2017-08-30 DIAGNOSIS — I130 Hypertensive heart and chronic kidney disease with heart failure and stage 1 through stage 4 chronic kidney disease, or unspecified chronic kidney disease: Secondary | ICD-10-CM | POA: Diagnosis not present

## 2017-08-30 DIAGNOSIS — I272 Pulmonary hypertension, unspecified: Secondary | ICD-10-CM | POA: Diagnosis not present

## 2017-08-30 DIAGNOSIS — N189 Chronic kidney disease, unspecified: Secondary | ICD-10-CM | POA: Insufficient documentation

## 2017-08-30 DIAGNOSIS — R001 Bradycardia, unspecified: Secondary | ICD-10-CM

## 2017-08-30 DIAGNOSIS — E1122 Type 2 diabetes mellitus with diabetic chronic kidney disease: Secondary | ICD-10-CM | POA: Insufficient documentation

## 2017-08-30 DIAGNOSIS — I083 Combined rheumatic disorders of mitral, aortic and tricuspid valves: Secondary | ICD-10-CM | POA: Insufficient documentation

## 2017-08-30 DIAGNOSIS — E7800 Pure hypercholesterolemia, unspecified: Secondary | ICD-10-CM

## 2017-08-30 DIAGNOSIS — I509 Heart failure, unspecified: Secondary | ICD-10-CM | POA: Diagnosis not present

## 2017-08-30 DIAGNOSIS — I6523 Occlusion and stenosis of bilateral carotid arteries: Secondary | ICD-10-CM | POA: Diagnosis not present

## 2017-08-30 DIAGNOSIS — R011 Cardiac murmur, unspecified: Secondary | ICD-10-CM

## 2017-08-30 DIAGNOSIS — G4733 Obstructive sleep apnea (adult) (pediatric): Secondary | ICD-10-CM | POA: Diagnosis not present

## 2017-08-30 DIAGNOSIS — I5032 Chronic diastolic (congestive) heart failure: Secondary | ICD-10-CM

## 2017-08-30 LAB — ECHOCARDIOGRAM COMPLETE
Height: 69 in
Weight: 2144 oz

## 2017-08-30 NOTE — Progress Notes (Signed)
Cardiology Office Note:    Date:  08/30/2017   ID:  Jeffrey Campbell, DOB [birth year], MRN [id number]  PCP:  [name], MD  Cardiologist:  No primary care provider on file.    Referring MD: [name], MD   Chief Complaint  Patient presents with  . Coronary Artery Disease  . Hypertension  . Hyperlipidemia    History of Present Illness:    Jeffrey Campbell is a 83 y.o. male with a hx of ASCAD ([place] 2012 with 50-60% ostial LAD, 50-60%mLAD, 60% pRCA), HTN, dyslipidemia, mild MR with posterior MVP, bradycardia and moderate pulmonary HTN (followed by Dr. Aundra Dubin.  He has had an extensive w/u for pulmonary HTN (Group 42from pulmonary venous HTN in setting of elevated left heart pressure) with PFTs showing mild COPD but moderate to severely reduced DLCO, neg VQ scan for PE and chest CT with no interstitial lung disease. He does have OSA but is intolerant to CPAP. He also has bilateral renal artery stenosis, mesenteric artery stenosis and distal aortic stenosis followed by Dr. Fletcher Anon.  [place] 01/2015 showed mild pulmonary venous HTN with prominent V waves in the PCWP and RA. 2D echo showed mild MR with MVP so prominent V waves felt to be due to stiff ventricles/diastolic dysfunction in setting of poorly controlled HTN. He has chronic diastolic CHF.   He is here today for followup and is doing well.  He denies any chest pain or pressure, SOB, DOE, PND, orthopnea, LE edema, dizziness, palpitations or syncope. He is compliant with his meds and is tolerating meds with no SE.      Past Medical History:  Diagnosis Date  . BPH (benign prostatic hyperplasia)   . CAD (coronary artery disease)   . Carotid artery stenosis    1-39% bilateral carotid artery stenosis and < 50% stenosis in the right CCA by dopplers 06/2017  . CKD (chronic kidney disease), stage III ([place])   . Diastolic dysfunction   . Glaucoma   . Graves disease   . Heart murmur, systolic   . History of ETOH abuse   . History of  PFTs 05/2010   moderate airflow obstruction w reduced DLCO by PFT   . Hyperlipemia   . Hypertension   . Hyperthyroidism 08/26/10   radioactive iodine therapy   . Mitral regurgitation echo 2015   mild  . Multiple thyroid nodules   . MVP (mitral valve prolapse) 11/2012   posterior MVP  . Organic impotence   . OSA (obstructive sleep apnea)    upper airway resistance syndrome with RDI 18/hr - not on CPAP due to insurance not covering  . PUD (peptic ulcer disease)   . Pulmonary hypertension ([place]) echo 2015   Group 2 with pulmonary venous HTN and Group 3 with OSA  . Shutdown renal    after cardiac cath  . Type 2 diabetes, diet controlled ([place])   . Upper airway resistance syndrome     Past Surgical History:  Procedure Laterality Date  . APPENDECTOMY    . CARDIAC CATHETERIZATION    . CARDIAC CATHETERIZATION N/A 02/17/2015   Procedure: Right Heart Cath;  Surgeon: Larey Dresser, MD;  Location: [facility];  Service: Cardiovascular;  Laterality: N/A;  . ESOPHAGOGASTRODUODENOSCOPY (EGD) WITH PROPOFOL Left 10/16/2016   Procedure: ESOPHAGOGASTRODUODENOSCOPY (EGD) WITH PROPOFOL;  Surgeon: Ronnette Juniper, MD;  Location: [facility];  Service: Gastroenterology;  Laterality: Left;  . heart catherization    . HERNIA REPAIR  10/2009  . IR  RADIOLOGIST EVAL & MGMT  09/28/2016  . IR RADIOLOGIST EVAL & MGMT  10/19/2016  . IR RADIOLOGIST EVAL & MGMT  01/18/2017  . RIGHT HEART CATH N/A 07/28/2016   Procedure: Right Heart Cath;  Surgeon: Larey Dresser, MD;  Location: [facility];  Service: Cardiovascular;  Laterality: N/A;    Current Medications: Current Meds  Medication Sig  . amLODipine (NORVASC) 10 MG tablet Take 1 tablet (10 mg total) by mouth daily.  Marland Kitchen aspirin EC 81 MG tablet Take 81 mg by mouth daily.  Marland Kitchen atorvastatin (LIPITOR) 20 MG tablet TAKE 1 TABLET BY MOUTH EVERY DAY  . Brinzolamide-Brimonidine (SIMBRINZA) 1-0.2 % SUSP Place 1 drop into both eyes 2 (two) times daily.   Marland Kitchen  doxazosin (CARDURA) 2 MG tablet Take 1 tablet (2 mg total) daily by mouth.  . feeding supplement, ENSURE ENLIVE, (ENSURE ENLIVE) LIQD Take 237 mLs by mouth 2 (two) times daily between meals.  . furosemide (LASIX) 40 MG tablet Take 1 tablet (40 mg total) by mouth every other day.  . hydrALAZINE (APRESOLINE) 100 MG tablet Take 1 tablet (100 mg total) by mouth 3 (three) times daily. Please make yearly appt with Dr. Radford Pax for October. 1st attempt  . levothyroxine (SYNTHROID, LEVOTHROID) 137 MCG tablet Take 137 mcg by mouth daily before breakfast.   . Multiple Vitamin (MULTIVITAMIN WITH MINERALS) TABS tablet Take 1 tablet by mouth daily.  . potassium chloride (KLOR-CON M10) 10 MEQ tablet Take 1 tablet (10 mEq total) by mouth daily.  . QUEtiapine (SEROQUEL) 25 MG tablet Take 1 tablet (25 mg total) by mouth at bedtime.     Allergies:   Iodinated diagnostic agents; Zocor [simvastatin]; Minoxidil; Tricor [fenofibrate]; Budesonide-formoterol fumarate; and Tiotropium bromide monohydrate   Social History   Socioeconomic History  . Marital status: Married    Spouse name: Not on file  . Number of children: 4  . Years of education: Not on file  . Highest education level: Not on file  Occupational History  . Occupation: Retired    Fish farm manager: RETIRED    Comment: [employer]  Social Needs  . Financial resource strain: Not on file  . Food insecurity:    Worry: Not on file    Inability: Not on file  . Transportation needs:    Medical: Not on file    Non-medical: Not on file  Tobacco Use  . Smoking status: Former Smoker    Packs/day: 2.00    Years: 40.00    Pack years: 80.00    Types: Cigarettes    Last attempt to quit: 03/29/1984    Years since quitting: 33.4  . Smokeless tobacco: Never Used  Substance and Sexual Activity  . Alcohol use: No    Comment: quit in 1981  . Drug use: No  . Sexual activity: Not on file  Lifestyle  . Physical activity:    Days per week: Not on file     Minutes per session: Not on file  . Stress: Not on file  Relationships  . Social connections:    Talks on phone: Not on file    Gets together: Not on file    Attends religious service: Not on file    Active member of club or organization: Not on file    Attends meetings of clubs or organizations: Not on file    Relationship status: Not on file  Other Topics Concern  . Not on file  Social History Narrative  . Not on  file     Family History: The patient's family history includes Colon cancer in his brother, maternal uncle, and mother; Hypertension in his father; Kidney failure in his father; Lung disease in his brother.  ROS:   Please see the history of present illness.    ROS  All other systems reviewed and negative.   EKGs/Labs/Other Studies Reviewed:    The following studies were reviewed today: none  EKG:  EKG is  ordered today and showed sinus bradycardia at 43 bpm with first-degree AV block and LVH with repolarization of normality.  Recent Labs: 09/04/2016: TSH 0.602 09/13/2016: Magnesium 2.4 11/03/2016: Platelets 202 01/13/2017: BUN 47; Creat 3.10; Potassium 4.2; Sodium 141 06/27/2017: ALT 11 08/25/2017: Hemoglobin 11.2   Recent Lipid Panel    Component Value Date/Time   CHOL 117 06/27/2017 0925   TRIG 45 06/27/2017 0925   HDL 60 06/27/2017 0925   CHOLHDL 2.0 06/27/2017 0925   CHOLHDL 1.9 01/20/2016 0909   VLDL 11 01/20/2016 0909   LDLCALC 48 06/27/2017 0925    Physical Exam:    VS:  BP (!) 156/60   Pulse (!) 43   Ht 5\' 9"  (1.753 m)   Wt 134 lb (60.8 kg)   BMI 19.79 kg/m     Wt Readings from Last 3 Encounters:  08/30/17 134 lb (60.8 kg)  02/18/17 145 lb (65.8 kg)  01/07/17 143 lb (64.9 kg)     GEN:  Well nourished, well developed in no acute distress HEENT: Normal NECK: No JVD; No carotid bruits LYMPHATICS: No lymphadenopathy CARDIAC: RRR, no murmurs, rubs, gallops RESPIRATORY:  Clear to auscultation without rales, wheezing or rhonchi  ABDOMEN:  Soft, non-tender, non-distended MUSCULOSKELETAL:  No edema; No deformity  SKIN: Warm and dry NEUROLOGIC:  Alert and oriented x 3 PSYCHIATRIC:  Normal affect   ASSESSMENT:    1. Coronary artery disease involving native coronary artery of native heart without angina pectoris   2. Chronic diastolic CHF (congestive heart failure) ([place])   3. Pulmonary hypertension ([place])   4. Bilateral carotid artery stenosis   5. Hypertensive heart and chronic kidney disease with heart failure and stage 1 through stage 4 chronic kidney disease, or chronic kidney disease ([place])   6. Pure hypercholesterolemia   7. CKD (chronic kidney disease), stage IV ([place])   8. Heart murmur   9. Bradycardia    PLAN:    In order of problems listed above:  1.  ASCAD - [place] 2012 with 50-60% ostial LAD, 50-60%mLAD, 60% pRCA.  He has not had any anginal symptoms since I saw him last.  He will continue aspirin 81 mg daily, atorvastatin 20 mg daily.  2.  Chronic diastolic heart failure -his weight is stable and he appears euvolemic on exam today.  Continue on diuretic therapy with Lasix 40 mg every other day.  3.  Pulmonary hypertension - He has had an extensive w/u for pulmonary HTN (Group 86from pulmonary venous HTN in setting of elevated left heart pressure) with PFTs showing mild COPD but moderate to severely reduced DLCO, neg VQ scan for PE and chest CT with no interstitial lung disease. He does have OSA but is intolerant to CPAP.  Pulmonary artery pressure had increase since prior study, now at 88 mmHg and he underwent right heart catheterization with Dr. Aundra Dubin showing mildly elevated right and left heart pressures with moderate PHTN with PVR 1.74 WU and PCW 22mmHg c/w pulmonary venous HTN with a component of Type 3 from OSA and not  a candidate for pulmonary vasodilators. PA systolic pressure was 46 mm on echo 09/06/2016.  This is likely related to pulmonary venous hypertension from his chronic diastolic heart failure.  He will  continue on Lasix 40 mg every other day.  Creatinine was 3.93 on 06/15/2017.  I will repeat a 2D echo to reassess PASP.   4.  Bilateral carotid artery stenosis -carotid Dopplers 07/11/2017 showed 1 to 39% bilateral carotid stenosis with less than 50% stenosis of the right CCA.  I will repeat Dopplers in 2 years.  He will continue on aspirin and statin therapy.  5  Hypertension -BP is borderline controlled on exam today.  He will continue on amlodipine 10 mg daily, hydralazine 100 mg 3 times daily and doxazosin 2 mg daily.   I have asked him to check his BP daily for a week and call with the results.   6.  Hyperlipidemia LDL goal less than 70.  His LDL was at goal at 48 on 06/27/2017 and ALT was normal at 11.  He will continue on atorvastatin 20 mg daily.  7.  Chronic kidney disease stage IV -creatinine was 3.93 on 06/15/2017.  He is followed by nephrology.  8.  Heart murmur - echo a year ago did not show any significant valvular disease but he has a very loud murmur on exam today.  I will get an echo to assess.   9.  Bradycardia - is completely asymptomatic and heart rate is 43 bpm today on EKG which is unchanged from a year ago at 41 bpm.  He denies any dyspnea on exertion, exertional fatigue, dizziness, syncope.  He is not on any rate slowing medications.  We will continue to follow.     Medication Adjustments/Labs and Tests Ordered: Current medicines are reviewed at length with the patient today.  Concerns regarding medicines are outlined above.  Orders Placed This Encounter  Procedures  . EKG 12-Lead   No orders of the defined types were placed in this encounter.   Signed, Fransico Him, MD  08/30/2017 9:23 AM    [facility]

## 2017-08-30 NOTE — Patient Instructions (Signed)
Medication Instructions:  Your physician recommends that you continue on your current medications as directed. Please refer to the Current Medication list given to you today.  If you need a refill on your cardiac medications, please contact your pharmacy first.  Labwork: None ordered   Testing/Procedures: Your physician has requested that you have an echocardiogram. Echocardiography is a painless test that uses sound waves to create images of your heart. It provides your doctor with information about the size and shape of your heart and how well your heart's chambers and valves are working. This procedure takes approximately one hour. There are no restrictions for this procedure.   Follow-Up: Your physician wants you to follow-up in: 6 month f/u with PA. You will receive a reminder letter in the mail two months in advance. If you don't receive a letter, please call our office to schedule the follow-up appointment.  Your physician wants you to follow-up in: 1 year with Dr. Radford Pax. You will receive a reminder letter in the mail two months in advance. If you don't receive a letter, please call our office to schedule the follow-up appointment.  Any Other Special Instructions Will Be Listed Below (If Applicable).   Thank you for choosing [facility], RN  [phone]  If you need a refill on your cardiac medications before your next appointment, please call your pharmacy.

## 2017-09-01 ENCOUNTER — Ambulatory Visit (HOSPITAL_COMMUNITY)
Admission: RE | Admit: 2017-09-01 | Discharge: 2017-09-01 | Disposition: A | Discharge status: Home / Self Care | Payer: Medicare Other | Source: Ambulatory Visit | Attending: Nephrology | Admitting: Nephrology

## 2017-09-01 VITALS — BP 156/61 | HR 41 | Temp 97.9°F | Resp 20

## 2017-09-01 DIAGNOSIS — N183 Chronic kidney disease, stage 3 (moderate): Secondary | ICD-10-CM | POA: Insufficient documentation

## 2017-09-01 DIAGNOSIS — Z5181 Encounter for therapeutic drug level monitoring: Secondary | ICD-10-CM | POA: Diagnosis not present

## 2017-09-01 DIAGNOSIS — Z79899 Other long term (current) drug therapy: Secondary | ICD-10-CM | POA: Diagnosis not present

## 2017-09-01 DIAGNOSIS — D631 Anemia in chronic kidney disease: Secondary | ICD-10-CM | POA: Insufficient documentation

## 2017-09-01 DIAGNOSIS — I130 Hypertensive heart and chronic kidney disease with heart failure and stage 1 through stage 4 chronic kidney disease, or unspecified chronic kidney disease: Secondary | ICD-10-CM

## 2017-09-01 LAB — POCT HEMOGLOBIN-HEMACUE: Hemoglobin: 11.7 g/dL — ABNORMAL LOW (ref 13.0–17.0)

## 2017-09-01 MED ORDER — EPOETIN ALFA 10000 UNIT/ML IJ SOLN
INTRAMUSCULAR | Status: AC
  Filled 2017-09-01: qty 1

## 2017-09-01 MED ORDER — EPOETIN ALFA 10000 UNIT/ML IJ SOLN
5000.0000 [IU] | INTRAMUSCULAR | Status: DC
  Administered 2017-09-01: 5000 [IU] via SUBCUTANEOUS

## 2017-09-02 ENCOUNTER — Telehealth: Payer: Self-pay

## 2017-09-02 DIAGNOSIS — I272 Pulmonary hypertension, unspecified: Secondary | ICD-10-CM

## 2017-09-02 NOTE — Telephone Encounter (Signed)
Notes recorded by Teressa Senter, RN on 09/02/2017 at 1:43 PM EDT Pt made aware of echo results and Dr. Theodosia Blender recommendation for BNP due to increased filling pressures and PASP. He stated understanding and thankful for the call. Lab scheduled for 09/06/17.  Notes recorded by Sueanne Margarita, MD on 09/01/2017 at 8:32 AM EDT Echo showed normal LVF with increased stiffness of heart muscle normal for his age, mild MR, moderate TR and moderate pulmonary HTN - compared to last echo it appears he has increased filling pressures and PASP has increased. Please have him come in for a BNP

## 2017-09-05 DIAGNOSIS — N400 Enlarged prostate without lower urinary tract symptoms: Secondary | ICD-10-CM | POA: Diagnosis not present

## 2017-09-05 DIAGNOSIS — R3915 Urgency of urination: Secondary | ICD-10-CM | POA: Diagnosis not present

## 2017-09-06 ENCOUNTER — Other Ambulatory Visit: Payer: Medicare Other

## 2017-09-06 DIAGNOSIS — I272 Pulmonary hypertension, unspecified: Secondary | ICD-10-CM | POA: Diagnosis not present

## 2017-09-06 DIAGNOSIS — I5032 Chronic diastolic (congestive) heart failure: Secondary | ICD-10-CM | POA: Diagnosis not present

## 2017-09-06 DIAGNOSIS — I130 Hypertensive heart and chronic kidney disease with heart failure and stage 1 through stage 4 chronic kidney disease, or unspecified chronic kidney disease: Secondary | ICD-10-CM | POA: Diagnosis not present

## 2017-09-06 LAB — PRO B NATRIURETIC PEPTIDE: NT-Pro BNP: 2000 pg/mL — ABNORMAL HIGH (ref 0–486)

## 2017-09-08 ENCOUNTER — Ambulatory Visit (HOSPITAL_COMMUNITY)
Admission: RE | Admit: 2017-09-08 | Discharge: 2017-09-08 | Disposition: A | Discharge status: Home / Self Care | Payer: Medicare Other | Source: Ambulatory Visit | Attending: Nephrology | Admitting: Nephrology

## 2017-09-08 VITALS — BP 178/53 | HR 43 | Temp 97.6°F | Resp 20

## 2017-09-08 DIAGNOSIS — D631 Anemia in chronic kidney disease: Secondary | ICD-10-CM | POA: Diagnosis not present

## 2017-09-08 DIAGNOSIS — N183 Chronic kidney disease, stage 3 (moderate): Secondary | ICD-10-CM | POA: Insufficient documentation

## 2017-09-08 DIAGNOSIS — I130 Hypertensive heart and chronic kidney disease with heart failure and stage 1 through stage 4 chronic kidney disease, or unspecified chronic kidney disease: Secondary | ICD-10-CM

## 2017-09-08 LAB — POCT HEMOGLOBIN-HEMACUE: Hemoglobin: 11.9 g/dL — ABNORMAL LOW (ref 13.0–17.0)

## 2017-09-08 MED ORDER — EPOETIN ALFA 10000 UNIT/ML IJ SOLN
INTRAMUSCULAR | Status: AC
  Filled 2017-09-08: qty 1

## 2017-09-08 MED ORDER — EPOETIN ALFA 10000 UNIT/ML IJ SOLN
5000.0000 [IU] | INTRAMUSCULAR | Status: DC
  Administered 2017-09-08: 5000 [IU] via SUBCUTANEOUS

## 2017-09-13 DIAGNOSIS — E0500 Thyrotoxicosis with diffuse goiter without thyrotoxic crisis or storm: Secondary | ICD-10-CM | POA: Diagnosis not present

## 2017-09-13 DIAGNOSIS — E890 Postprocedural hypothyroidism: Secondary | ICD-10-CM | POA: Diagnosis not present

## 2017-09-13 DIAGNOSIS — R001 Bradycardia, unspecified: Secondary | ICD-10-CM | POA: Diagnosis not present

## 2017-09-13 DIAGNOSIS — E119 Type 2 diabetes mellitus without complications: Secondary | ICD-10-CM | POA: Diagnosis not present

## 2017-09-15 ENCOUNTER — Ambulatory Visit (HOSPITAL_COMMUNITY)
Admission: RE | Admit: 2017-09-15 | Discharge: 2017-09-15 | Disposition: A | Discharge status: Home / Self Care | Payer: Medicare Other | Source: Ambulatory Visit | Attending: Nephrology | Admitting: Nephrology

## 2017-09-15 VITALS — BP 171/73 | HR 44 | Temp 97.7°F | Resp 18

## 2017-09-15 DIAGNOSIS — N183 Chronic kidney disease, stage 3 (moderate): Secondary | ICD-10-CM | POA: Diagnosis not present

## 2017-09-15 DIAGNOSIS — I130 Hypertensive heart and chronic kidney disease with heart failure and stage 1 through stage 4 chronic kidney disease, or unspecified chronic kidney disease: Secondary | ICD-10-CM

## 2017-09-15 LAB — IRON AND TIBC
Iron: 74 ug/dL (ref 45–182)
Saturation Ratios: 27 % (ref 17.9–39.5)
TIBC: 273 ug/dL (ref 250–450)
UIBC: 199 ug/dL

## 2017-09-15 LAB — POCT HEMOGLOBIN-HEMACUE: HEMOGLOBIN: 12.1 g/dL — AB (ref 13.0–17.0)

## 2017-09-15 LAB — FERRITIN: FERRITIN: 165 ng/mL (ref 24–336)

## 2017-09-15 MED ORDER — EPOETIN ALFA 10000 UNIT/ML IJ SOLN: 5000.0000 [IU] | INTRAMUSCULAR | Status: DC

## 2017-09-20 DIAGNOSIS — Z8719 Personal history of other diseases of the digestive system: Secondary | ICD-10-CM | POA: Diagnosis not present

## 2017-09-20 DIAGNOSIS — N2581 Secondary hyperparathyroidism of renal origin: Secondary | ICD-10-CM | POA: Diagnosis not present

## 2017-09-20 DIAGNOSIS — I129 Hypertensive chronic kidney disease with stage 1 through stage 4 chronic kidney disease, or unspecified chronic kidney disease: Secondary | ICD-10-CM | POA: Diagnosis not present

## 2017-09-20 DIAGNOSIS — N400 Enlarged prostate without lower urinary tract symptoms: Secondary | ICD-10-CM | POA: Diagnosis not present

## 2017-09-20 DIAGNOSIS — I272 Pulmonary hypertension, unspecified: Secondary | ICD-10-CM | POA: Diagnosis not present

## 2017-09-20 DIAGNOSIS — D631 Anemia in chronic kidney disease: Secondary | ICD-10-CM | POA: Diagnosis not present

## 2017-09-20 DIAGNOSIS — K219 Gastro-esophageal reflux disease without esophagitis: Secondary | ICD-10-CM | POA: Diagnosis not present

## 2017-09-20 DIAGNOSIS — E0500 Thyrotoxicosis with diffuse goiter without thyrotoxic crisis or storm: Secondary | ICD-10-CM | POA: Diagnosis not present

## 2017-09-20 DIAGNOSIS — I5030 Unspecified diastolic (congestive) heart failure: Secondary | ICD-10-CM | POA: Diagnosis not present

## 2017-09-20 DIAGNOSIS — N184 Chronic kidney disease, stage 4 (severe): Secondary | ICD-10-CM | POA: Diagnosis not present

## 2017-09-20 DIAGNOSIS — I2510 Atherosclerotic heart disease of native coronary artery without angina pectoris: Secondary | ICD-10-CM | POA: Diagnosis not present

## 2017-09-20 DIAGNOSIS — J449 Chronic obstructive pulmonary disease, unspecified: Secondary | ICD-10-CM | POA: Diagnosis not present

## 2017-09-22 ENCOUNTER — Encounter (HOSPITAL_COMMUNITY): Payer: Medicare Other

## 2017-09-27 ENCOUNTER — Other Ambulatory Visit: Payer: Self-pay

## 2017-09-27 DIAGNOSIS — K219 Gastro-esophageal reflux disease without esophagitis: Secondary | ICD-10-CM | POA: Diagnosis not present

## 2017-09-27 DIAGNOSIS — D631 Anemia in chronic kidney disease: Secondary | ICD-10-CM | POA: Diagnosis not present

## 2017-09-27 DIAGNOSIS — Z01812 Encounter for preprocedural laboratory examination: Secondary | ICD-10-CM

## 2017-09-27 DIAGNOSIS — N2581 Secondary hyperparathyroidism of renal origin: Secondary | ICD-10-CM | POA: Diagnosis not present

## 2017-09-27 DIAGNOSIS — N184 Chronic kidney disease, stage 4 (severe): Secondary | ICD-10-CM | POA: Diagnosis not present

## 2017-09-27 DIAGNOSIS — I5030 Unspecified diastolic (congestive) heart failure: Secondary | ICD-10-CM | POA: Diagnosis not present

## 2017-09-27 DIAGNOSIS — J449 Chronic obstructive pulmonary disease, unspecified: Secondary | ICD-10-CM | POA: Diagnosis not present

## 2017-09-27 DIAGNOSIS — Z8719 Personal history of other diseases of the digestive system: Secondary | ICD-10-CM | POA: Diagnosis not present

## 2017-09-27 DIAGNOSIS — I2510 Atherosclerotic heart disease of native coronary artery without angina pectoris: Secondary | ICD-10-CM | POA: Diagnosis not present

## 2017-09-27 DIAGNOSIS — N400 Enlarged prostate without lower urinary tract symptoms: Secondary | ICD-10-CM | POA: Diagnosis not present

## 2017-09-27 DIAGNOSIS — I272 Pulmonary hypertension, unspecified: Secondary | ICD-10-CM | POA: Diagnosis not present

## 2017-09-27 DIAGNOSIS — I129 Hypertensive chronic kidney disease with stage 1 through stage 4 chronic kidney disease, or unspecified chronic kidney disease: Secondary | ICD-10-CM | POA: Diagnosis not present

## 2017-09-27 DIAGNOSIS — Z9889 Other specified postprocedural states: Secondary | ICD-10-CM | POA: Diagnosis not present

## 2017-09-28 ENCOUNTER — Encounter: Payer: Self-pay | Admitting: *Deleted

## 2017-09-28 ENCOUNTER — Other Ambulatory Visit: Payer: Self-pay | Admitting: *Deleted

## 2017-09-28 ENCOUNTER — Ambulatory Visit (INDEPENDENT_AMBULATORY_CARE_PROVIDER_SITE_OTHER): Payer: Medicare Other | Admitting: Vascular Surgery

## 2017-09-28 ENCOUNTER — Encounter: Payer: Self-pay | Admitting: Vascular Surgery

## 2017-09-28 ENCOUNTER — Ambulatory Visit (INDEPENDENT_AMBULATORY_CARE_PROVIDER_SITE_OTHER)
Admission: RE | Admit: 2017-09-28 | Discharge: 2017-09-28 | Disposition: A | Discharge status: Home / Self Care | Payer: Medicare Other | Source: Ambulatory Visit | Attending: Vascular Surgery | Admitting: Vascular Surgery

## 2017-09-28 ENCOUNTER — Other Ambulatory Visit: Payer: Self-pay

## 2017-09-28 ENCOUNTER — Ambulatory Visit (HOSPITAL_COMMUNITY)
Admission: RE | Admit: 2017-09-28 | Discharge: 2017-09-28 | Disposition: A | Discharge status: Home / Self Care | Payer: Medicare Other | Source: Ambulatory Visit | Attending: Vascular Surgery | Admitting: Vascular Surgery

## 2017-09-28 VITALS — BP 177/63 | HR 43 | Temp 98.5°F | Resp 16 | Ht 69.0 in | Wt 129.0 lb

## 2017-09-28 DIAGNOSIS — Z4931 Encounter for adequacy testing for hemodialysis: Secondary | ICD-10-CM | POA: Diagnosis not present

## 2017-09-28 DIAGNOSIS — N184 Chronic kidney disease, stage 4 (severe): Secondary | ICD-10-CM

## 2017-09-28 DIAGNOSIS — Z01812 Encounter for preprocedural laboratory examination: Secondary | ICD-10-CM

## 2017-09-28 NOTE — Progress Notes (Signed)
Patient ID: Jeffrey Campbell, male   DOB: September 11, 1932, 84 y.o.   MRN: [id number]  Reason for Consult: New Patient (Initial Visit) (CKD stage 5- Eval for AVF/AVG now.  Dr. Justin Mend requested urgent appt.)   Referred by [name], MD  Subjective:     HPI:  Jeffrey Campbell is a 84 y.o. male chronic kidney disease presents for evaluation of permanent access.  He is not currently on dialysis.  He does not take blood thinners.  Does have a history of coronary disease.  Denies having stroke.  Wife is a renal transplant recipient seems to be very knowledgeable about dialysis.  He is right-hand dominant denies any upper extremity chest or breast surgery.  Past Medical History:  Diagnosis Date  . BPH (benign prostatic hyperplasia)   . CAD (coronary artery disease)   . Carotid artery stenosis    1-39% bilateral carotid artery stenosis and < 50% stenosis in the right CCA by dopplers 06/2017  . CKD (chronic kidney disease), stage III ([place])   . Diastolic dysfunction   . Glaucoma   . Graves disease   . Heart murmur, systolic   . History of ETOH abuse   . History of PFTs 05/2010   moderate airflow obstruction w reduced DLCO by PFT   . Hyperlipemia   . Hypertension   . Hyperthyroidism 08/26/10   radioactive iodine therapy   . Mitral regurgitation echo 2015   mild  . Multiple thyroid nodules   . MVP (mitral valve prolapse) 11/2012   posterior MVP  . Organic impotence   . OSA (obstructive sleep apnea)    upper airway resistance syndrome with RDI 18/hr - not on CPAP due to insurance not covering  . PUD (peptic ulcer disease)   . Pulmonary hypertension ([place]) echo 2015   Group 2 with pulmonary venous HTN and Group 3 with OSA  . Shutdown renal    after cardiac cath  . Type 2 diabetes, diet controlled ([place])   . Upper airway resistance syndrome    Family History  Problem Relation Age of Onset  . Kidney failure Father   . Hypertension Father   . Colon cancer Mother   . Colon cancer Brother   .  Lung disease Brother   . Colon cancer Maternal Uncle    Past Surgical History:  Procedure Laterality Date  . APPENDECTOMY    . CARDIAC CATHETERIZATION    . CARDIAC CATHETERIZATION N/A 02/17/2015   Procedure: Right Heart Cath;  Surgeon: Larey Dresser, MD;  Location: [facility];  Service: Cardiovascular;  Laterality: N/A;  . ESOPHAGOGASTRODUODENOSCOPY (EGD) WITH PROPOFOL Left 10/16/2016   Procedure: ESOPHAGOGASTRODUODENOSCOPY (EGD) WITH PROPOFOL;  Surgeon: Ronnette Juniper, MD;  Location: [facility];  Service: Gastroenterology;  Laterality: Left;  . heart catherization    . HERNIA REPAIR  10/2009  . IR RADIOLOGIST EVAL & MGMT  09/28/2016  . IR RADIOLOGIST EVAL & MGMT  10/19/2016  . IR RADIOLOGIST EVAL & MGMT  01/18/2017  . RIGHT HEART CATH N/A 07/28/2016   Procedure: Right Heart Cath;  Surgeon: Larey Dresser, MD;  Location: [facility];  Service: Cardiovascular;  Laterality: N/A;    Short Social History:  Social History   Tobacco Use  . Smoking status: Former Smoker    Packs/day: 2.00    Years: 40.00    Pack years: 80.00    Types: Cigarettes    Last attempt to quit: 03/29/1984    Years since quitting:  33.5  . Smokeless tobacco: Never Used  Substance Use Topics  . Alcohol use: No    Comment: quit in 1981    Allergies  Allergen Reactions  . Iodinated Diagnostic Agents Other (See Comments)    Right heart cath 07/2016 allegedly "shut down his kidneys" Patient has stage III chronic kidney disease  . Zocor [Simvastatin] Other (See Comments)    Liver problems   . Minoxidil Other (See Comments)  . Tricor [Fenofibrate]        . Budesonide-Formoterol Fumarate Other (See Comments)    Dry mouth  . Tiotropium Bromide Monohydrate Other (See Comments)    Dry mouth    Current Outpatient Medications  Medication Sig Dispense Refill  . aspirin EC 81 MG tablet Take 81 mg by mouth daily.    Marland Kitchen atorvastatin (LIPITOR) 20 MG tablet TAKE 1 TABLET BY MOUTH EVERY DAY 90 tablet 2  .  Brinzolamide-Brimonidine (SIMBRINZA) 1-0.2 % SUSP Place 1 drop into both eyes 2 (two) times daily.     Marland Kitchen doxazosin (CARDURA) 2 MG tablet Take 1 tablet (2 mg total) daily by mouth. 90 tablet 3  . feeding supplement, ENSURE ENLIVE, (ENSURE ENLIVE) LIQD Take 237 mLs by mouth 2 (two) times daily between meals. 237 mL 12  . furosemide (LASIX) 40 MG tablet Take 1 tablet (40 mg total) by mouth every other day. 20 tablet 3  . hydrALAZINE (APRESOLINE) 100 MG tablet Take 1 tablet (100 mg total) by mouth 3 (three) times daily. Please make yearly appt with Dr. Radford Pax for October. 1st attempt 90 tablet 1  . levothyroxine (SYNTHROID, LEVOTHROID) 137 MCG tablet Take 150 mcg by mouth daily before breakfast.     . Multiple Vitamin (MULTIVITAMIN WITH MINERALS) TABS tablet Take 1 tablet by mouth daily.    . potassium chloride (KLOR-CON M10) 10 MEQ tablet Take 1 tablet (10 mEq total) by mouth daily. 90 tablet 2  . QUEtiapine (SEROQUEL) 25 MG tablet Take 1 tablet (25 mg total) by mouth at bedtime. 15 tablet 0  . amLODipine (NORVASC) 10 MG tablet Take 1 tablet (10 mg total) by mouth daily. 30 tablet 0   No current facility-administered medications for this visit.     Review of Systems  Constitutional:  Constitutional negative. HENT: HENT negative.  Eyes: Eyes negative.  Respiratory: Respiratory negative.  Cardiovascular: Cardiovascular negative.  GI: Gastrointestinal negative.  Musculoskeletal: Musculoskeletal negative.  Skin: Skin negative.  Neurological: Positive for dizziness and facial asymmetry.  Hematologic: Hematologic/lymphatic negative.  Psychiatric: Psychiatric negative.        Objective:  Objective   Vitals:   09/28/17 0944  BP: (!) 177/63  Pulse: (!) 43  Resp: 16  Temp: 98.5 F (36.9 C)  TempSrc: Oral  SpO2: 97%  Weight: 129 lb (58.5 kg)  Height: 5\' 9"  (1.753 m)   Body mass index is 19.05 kg/m.  Physical Exam  Constitutional: He appears well-developed.  HENT:  Head:  Normocephalic.  Eyes: Pupils are equal, round, and reactive to light.  Neck: Normal range of motion. Neck supple.  Cardiovascular: Normal rate.  Pulses:      Radial pulses are 2+ on the right side, and 2+ on the left side.  Pulmonary/Chest: Effort normal.  Abdominal: Soft.  Musculoskeletal: He exhibits no edema.  Noticeable veins present in his upper extremity cephalic veins bilaterally.  Neurological: He is alert.  Skin: Skin is warm and dry.  Psychiatric: He has a normal mood and affect. His behavior is normal. Judgment and thought  content normal.    Data: I have independently interpreted his bilateral upper extremity arterial duplex which demonstrates triphasic waveforms and adequate brachial arteries bilaterally at the antecubital fossa.  I have also interpreted his vein mapping which demonstrates suitable cephalic and basilic veins bilaterally.     Assessment/Plan:     84 year old male with CKD 5 now indicated for permanent dialysis access.  He is right arm dominant.  Has veins on his left arm by a vein mapping are marginal but in evaluating him grossly he appears to have suitable cephalic vein with a very easily palpable brachial artery and we will proceed with creation of the fistula with possible ligation of side branches on the left in the near future.  We discussed the risk and benefits particularly primary nonfunction, need for future procedures, steal, IMN and he does demonstrate good understanding having a wife that was previously on dialysis.  We will get him scheduled for the very near future.     Waynetta Sandy MD [facility]

## 2017-09-30 ENCOUNTER — Ambulatory Visit (HOSPITAL_COMMUNITY)
Admission: RE | Admit: 2017-09-30 | Discharge: 2017-09-30 | Disposition: A | Discharge status: Home / Self Care | Payer: Medicare Other | Source: Ambulatory Visit | Attending: Nephrology | Admitting: Nephrology

## 2017-09-30 VITALS — BP 175/59 | HR 44

## 2017-09-30 DIAGNOSIS — N183 Chronic kidney disease, stage 3 (moderate): Secondary | ICD-10-CM | POA: Diagnosis not present

## 2017-09-30 DIAGNOSIS — I130 Hypertensive heart and chronic kidney disease with heart failure and stage 1 through stage 4 chronic kidney disease, or unspecified chronic kidney disease: Secondary | ICD-10-CM | POA: Insufficient documentation

## 2017-09-30 LAB — POCT HEMOGLOBIN-HEMACUE: HEMOGLOBIN: 12 g/dL — AB (ref 13.0–17.0)

## 2017-09-30 MED ORDER — EPOETIN ALFA 10000 UNIT/ML IJ SOLN
5000.0000 [IU] | INTRAMUSCULAR | Status: DC
Start: 2017-09-30 — End: 2017-09-30

## 2017-10-05 ENCOUNTER — Other Ambulatory Visit: Payer: Self-pay | Admitting: *Deleted

## 2017-10-05 DIAGNOSIS — H401113 Primary open-angle glaucoma, right eye, severe stage: Secondary | ICD-10-CM | POA: Diagnosis not present

## 2017-10-05 DIAGNOSIS — N185 Chronic kidney disease, stage 5: Secondary | ICD-10-CM

## 2017-10-05 DIAGNOSIS — H47291 Other optic atrophy, right eye: Secondary | ICD-10-CM | POA: Diagnosis not present

## 2017-10-05 DIAGNOSIS — H0520 Unspecified exophthalmos: Secondary | ICD-10-CM | POA: Diagnosis not present

## 2017-10-05 DIAGNOSIS — H401121 Primary open-angle glaucoma, left eye, mild stage: Secondary | ICD-10-CM | POA: Diagnosis not present

## 2017-10-05 DIAGNOSIS — Z961 Presence of intraocular lens: Secondary | ICD-10-CM | POA: Diagnosis not present

## 2017-10-07 ENCOUNTER — Encounter (HOSPITAL_COMMUNITY): Payer: Medicare Other

## 2017-10-12 ENCOUNTER — Other Ambulatory Visit (HOSPITAL_COMMUNITY): Payer: Self-pay

## 2017-10-13 ENCOUNTER — Ambulatory Visit (HOSPITAL_COMMUNITY)
Admission: RE | Admit: 2017-10-13 | Discharge: 2017-10-13 | Disposition: A | Discharge status: Home / Self Care | Payer: Medicare Other | Source: Ambulatory Visit | Attending: Nephrology | Admitting: Nephrology

## 2017-10-13 VITALS — BP 153/59 | HR 46 | Temp 97.7°F

## 2017-10-13 DIAGNOSIS — N183 Chronic kidney disease, stage 3 (moderate): Secondary | ICD-10-CM | POA: Insufficient documentation

## 2017-10-13 DIAGNOSIS — D631 Anemia in chronic kidney disease: Secondary | ICD-10-CM | POA: Insufficient documentation

## 2017-10-13 DIAGNOSIS — I130 Hypertensive heart and chronic kidney disease with heart failure and stage 1 through stage 4 chronic kidney disease, or unspecified chronic kidney disease: Secondary | ICD-10-CM

## 2017-10-13 LAB — FERRITIN: FERRITIN: 242 ng/mL (ref 24–336)

## 2017-10-13 LAB — IRON AND TIBC
Iron: 56 ug/dL (ref 45–182)
Saturation Ratios: 22 % (ref 17.9–39.5)
TIBC: 252 ug/dL (ref 250–450)
UIBC: 196 ug/dL

## 2017-10-13 LAB — POCT HEMOGLOBIN-HEMACUE: HEMOGLOBIN: 11.4 g/dL — AB (ref 13.0–17.0)

## 2017-10-13 MED ORDER — EPOETIN ALFA 10000 UNIT/ML IJ SOLN
INTRAMUSCULAR | Status: AC
  Administered 2017-10-13: 5000 [IU] via SUBCUTANEOUS
  Filled 2017-10-13: qty 1

## 2017-10-13 MED ORDER — EPOETIN ALFA 10000 UNIT/ML IJ SOLN
5000.0000 [IU] | INTRAMUSCULAR | Status: DC
  Administered 2017-10-13: 5000 [IU] via SUBCUTANEOUS

## 2017-10-17 ENCOUNTER — Encounter (HOSPITAL_COMMUNITY): Payer: Self-pay | Admitting: *Deleted

## 2017-10-17 ENCOUNTER — Other Ambulatory Visit: Payer: Self-pay

## 2017-10-17 NOTE — Progress Notes (Signed)
Sharee Pimple, RN from Dr. Drema Dallas' office called back and said pt's most recent A1C was 5.5 on 09/13/17.

## 2017-10-17 NOTE — Progress Notes (Cosign Needed)
Anesthesia Chart Review: Same day workup  Case:  [id number] Date/Time:  10/18/17 0715   Procedure:  ARTERIOVENOUS (AV) FISTULA CREATION ARM (Left )   Anesthesia type:  Monitor Anesthesia Care   Pre-op diagnosis:  chronic kidney disease   Location:  [place] OR ROOM 10 / [facility]   Surgeon:  Waynetta Sandy, MD      DISCUSSION: 84 yo male former smoker for above procedure. Pertinent hx includes PUD, DMII (diet controlled), OSA (can't tolerate CPAP), MVP, MRegurg, HTN, hx of ETOH abuse, CAD (LHC 2012 with 50-60% ostial LAD, 50-60%mLAD, 34% pRCA), Diastolic CHF, bradycardia, Graves disease treated with RAI, now hypothyroid, ESRD not yet on HD, Anemia, PAD (Abdominal US (10/16) with > 50% distal aorta stenosis, > 70% SMA and celiac artery stenoses, > 60% bilateral renal artery stenosis.  Per Dr. Tressia Miners Turner's note on 08/30/2017 pt has significant pulmonary htn but appears to be optimized:   "He has had an extensive w/u for pulmonary HTN (Group 2 from pulmonary venous HTN in setting of elevated left heart pressure) with PFTs showing mild COPD but moderate to severely reduced DLCO, neg VQ scan for PE and chest CT with no interstitial lung disease.  He does have OSA but is intolerant to CPAP.  Pulmonary artery pressure had increase since prior study, now at 88 mmHg and he underwent right heart catheterization with Dr. Aundra Dubin showing mildly elevated right and left heart pressures with moderate PHTN with PVR 1.74 WU and PCW 80mmHg c/w pulmonary venous HTN with a component of Type 3 from OSA and not a candidate for pulmonary vasodilators.  He will continue on diuretics.  He denies any SOB, PND orthopnea or LE edema" New ECHO 08/30/2017 shows RVSP 62 mmHg.   Pt also had elevated NT-Pro BNP on 09/06/2017 at 2000pg/ml (Last value 1296pg/ml on 04/28/2016). Dr. Radford Pax commented on this result stating: "BNP elevated and likely related to CKD.  He denied any SOB on exam but echo diastolic filling pressures c/w volume overload.   Please forward this to his nephrologist for guidance in diuretic therapy."  Pt has known marked bradycardia that is asymptomatic. Has been followed for several years and this appears stable.  Anticipate he can proceed with surgery as scheduled barring acute status change.  VS: There were no vitals taken for this visit.  PROVIDERS: [name], MD is PCP  Fransico Him, MD is cardiologist las seen 08/30/2017  Loralie Champagne, MD is Heart Failure Cardiologist last seen 11/03/2016  [name], MD is Nephrologist   LABS: Will need labs DOS  Labs Reviewed - No data to display    IMAGES: PORTABLE CHEST 1 VIEW 09/20/2016  COMPARISON:  09/06/2016, 09/04/2016 and earlier, including CT chest 05/12/2016 and earlier.  FINDINGS: Cardiac silhouette moderately to markedly enlarged, unchanged. Thoracic aorta atherosclerotic, unchanged. Hilar and mediastinal contours otherwise unremarkable. Pulmonary vascularity normal without evidence of pulmonary edema. Atelectasis involving the left lung base. Lungs otherwise clear.  IMPRESSION: Atelectasis involving the left lung base. No acute cardiopulmonary disease otherwise. Stable cardiomegaly without pulmonary edema.  EKG: 08/30/2017: Marked sinus bradycardia with 1st degree block. RAE. LVH with repolarization abnormality. Vent rate 43bpm. Minimal interval change   CV: ECHO 08/30/2017 - Left ventricle: The cavity size was normal. Wall thickness was   increased in a pattern of moderate LVH. Systolic function was   normal. The estimated ejection fraction was in the range of 60%   to 65%. Wall motion was normal; there were no regional wall  motion abnormalities. Doppler parameters are consistent with   pseudonormal left ventricular relaxation (grade 2 diastolic   dysfunction). The E/e&' ratio is >15, suggesting elevated LV   filling pressure. - Aortic valve: Sclerosis without stenosis. Transvalvular velocity   was minimally increased.  There was trivial regurgitation. Mean   gradient (S): 8 mm Hg. - Mitral valve: Mildly thickened leaflets . There was mild   regurgitation. - Left atrium: Moderately dilated. - Right atrium: The atrium was mildly dilated. - Tricuspid valve: There was moderate regurgitation. - Pulmonary arteries: PA peak pressure: 62 mm Hg (S). - Inferior vena cava: The vessel was dilated. The respirophasic   diameter changes were blunted (< 50%), consistent with elevated   central venous pressure.  Impressions:  - Compared to a prior study in 2018, the RVSP is higher at 62 mmHg   (up from 49 mmHg) - LV filling pressure is also elevated.  Carotid US 07/11/2017 Final Interpretation: Right Carotid: Velocities in the right ICA are consistent with a 1-39% stenosis.        Non-hemodynamically significant plaque <50% noted in the CCA. The        RICA velocities remain within normal range and have decreased        compared to the prior exam.  Left Carotid: Velocities in the left ICA are consistent with a 1-39% stenosis.       The LICA velocities remain within normal range and stable compared       to the prior exam.  Vertebrals: Bilateral vertebral arteries demonstrate antegrade flow. Subclavians: Right subclavian artery flow was disturbed. Normal flow       hemodynamics were seen in the left subclavian artery.  Ali Chukson 07/28/2016 1. Mildly elevated right and left heart filling pressures.  2. Preserved cardiac output.  3. Moderate pulmonary hypertension.  PVR 1.74 WU, so suspect primarily pulmonary venous hypertension and possible component of PH from OSA.   Would not treat with selective pulmonary vasodilators.  Likely best treatment remains diuresis and CPAP.   Lexiscan 07/08/2014 Exercise Capacity:  Lexiscan with no exercise. BP Response:  Normal blood pressure response. Clinical Symptoms:  No significant symptoms noted. ECG Impression:  No significant ECG  changes with Lexiscan. Comparison with Prior Nuclear Study: No significant change from previous study  Overall Impression:  Normal stress nuclear study.  LV Ejection Fraction: 58%.  LV Wall Motion:  NL LV Function; NL Wall Motion  Past Medical History:  Diagnosis Date  . Anemia    low iron  . BPH (benign prostatic hyperplasia)   . CAD (coronary artery disease)   . Carotid artery stenosis    1-39% bilateral carotid artery stenosis and < 50% stenosis in the right CCA by dopplers 06/2017  . CKD (chronic kidney disease), stage III (HCC)    Stage 4  . Diastolic dysfunction   . Glaucoma   . Graves disease   . Heart murmur, systolic   . History of ETOH abuse   . History of PFTs 05/2010   moderate airflow obstruction w reduced DLCO by PFT   . Hyperlipemia   . Hypertension   . Hyperthyroidism 08/26/10   radioactive iodine therapy   . Mitral regurgitation echo 2015   mild  . Multiple thyroid nodules   . MVP (mitral valve prolapse) 11/2012   posterior MVP  . Organic impotence   . OSA (obstructive sleep apnea)    upper airway resistance syndrome with RDI 18/hr - not on CPAP due to insurance  not covering  . PUD (peptic ulcer disease)   . Pulmonary hypertension ([place]) echo 2015   Group 2 with pulmonary venous HTN and Group 3 with OSA  . Shutdown renal    after cardiac cath  . Type 2 diabetes, diet controlled ([place])   . Upper airway resistance syndrome     Past Surgical History:  Procedure Laterality Date  . APPENDECTOMY    . CARDIAC CATHETERIZATION    . CARDIAC CATHETERIZATION N/A 02/17/2015   Procedure: Right Heart Cath;  Surgeon: Larey Dresser, MD;  Location: [place] [facility];  Service: Cardiovascular;  Laterality: N/A;  . ESOPHAGOGASTRODUODENOSCOPY (EGD) WITH PROPOFOL Left 10/16/2016   Procedure: ESOPHAGOGASTRODUODENOSCOPY (EGD) WITH PROPOFOL;  Surgeon: Ronnette Juniper, MD;  Location: [place];  Service: Gastroenterology;  Laterality: Left;  . heart catherization    . HERNIA  REPAIR  10/2009  . IR RADIOLOGIST EVAL & MGMT  09/28/2016  . IR RADIOLOGIST EVAL & MGMT  10/19/2016  . IR RADIOLOGIST EVAL & MGMT  01/18/2017  . RIGHT HEART CATH N/A 07/28/2016   Procedure: Right Heart Cath;  Surgeon: Larey Dresser, MD;  Location: [place] [facility];  Service: Cardiovascular;  Laterality: N/A;    MEDICATIONS: No current facility-administered medications for this encounter.    Marland Kitchen amLODipine (NORVASC) 10 MG tablet  . aspirin EC 81 MG tablet  . atorvastatin (LIPITOR) 20 MG tablet  . Brinzolamide-Brimonidine (SIMBRINZA) 1-0.2 % SUSP  . doxazosin (CARDURA) 2 MG tablet  . ferrous sulfate 325 (65 FE) MG tablet  . furosemide (LASIX) 40 MG tablet  . hydrALAZINE (APRESOLINE) 100 MG tablet  . levothyroxine (SYNTHROID, LEVOTHROID) 137 MCG tablet  . Multiple Vitamin (MULTIVITAMIN WITH MINERALS) TABS tablet  . potassium chloride (KLOR-CON M10) 10 MEQ tablet  . feeding supplement, ENSURE ENLIVE, (ENSURE ENLIVE) LIQD  . QUEtiapine (SEROQUEL) 25 MG tablet    Wynonia Musty [facility] Short Stay Center/Anesthesiology Phone [phone] 10/17/2017 10:58 AM

## 2017-10-17 NOTE — Progress Notes (Signed)
Spoke with pt for pre-op call. Pt has hx of CAD, CHF, Heart murmur and Pulmonary HTN. Dr. Fransico Him is pt's cardiologist, last office visit was 08/30/17. Pt states he's pre-diabetic, does not check his blood sugar at home and is not on any medications. Dr. Drema Dallas is his PCP, I've called and requested most recent A1C.

## 2017-10-18 ENCOUNTER — Ambulatory Visit (HOSPITAL_COMMUNITY)
Admission: RE | Admit: 2017-10-18 | Discharge: 2017-10-18 | Disposition: A | Discharge status: Home / Self Care | Payer: Medicare Other | Source: Ambulatory Visit | Attending: Vascular Surgery | Admitting: Vascular Surgery

## 2017-10-18 ENCOUNTER — Ambulatory Visit (HOSPITAL_COMMUNITY): Payer: Medicare Other | Admitting: Physician Assistant

## 2017-10-18 ENCOUNTER — Encounter (HOSPITAL_COMMUNITY): Payer: Self-pay | Admitting: *Deleted

## 2017-10-18 ENCOUNTER — Telehealth: Payer: Self-pay | Admitting: Vascular Surgery

## 2017-10-18 ENCOUNTER — Encounter (HOSPITAL_COMMUNITY)
Admission: RE | Disposition: A | Discharge status: Home / Self Care | Payer: Self-pay | Source: Ambulatory Visit | Attending: Vascular Surgery

## 2017-10-18 DIAGNOSIS — Z8711 Personal history of peptic ulcer disease: Secondary | ICD-10-CM | POA: Insufficient documentation

## 2017-10-18 DIAGNOSIS — H409 Unspecified glaucoma: Secondary | ICD-10-CM | POA: Insufficient documentation

## 2017-10-18 DIAGNOSIS — E039 Hypothyroidism, unspecified: Secondary | ICD-10-CM | POA: Diagnosis not present

## 2017-10-18 DIAGNOSIS — E785 Hyperlipidemia, unspecified: Secondary | ICD-10-CM | POA: Insufficient documentation

## 2017-10-18 DIAGNOSIS — Z79899 Other long term (current) drug therapy: Secondary | ICD-10-CM | POA: Diagnosis not present

## 2017-10-18 DIAGNOSIS — G4733 Obstructive sleep apnea (adult) (pediatric): Secondary | ICD-10-CM | POA: Diagnosis not present

## 2017-10-18 DIAGNOSIS — I272 Pulmonary hypertension, unspecified: Secondary | ICD-10-CM | POA: Diagnosis not present

## 2017-10-18 DIAGNOSIS — I130 Hypertensive heart and chronic kidney disease with heart failure and stage 1 through stage 4 chronic kidney disease, or unspecified chronic kidney disease: Secondary | ICD-10-CM | POA: Insufficient documentation

## 2017-10-18 DIAGNOSIS — I6523 Occlusion and stenosis of bilateral carotid arteries: Secondary | ICD-10-CM | POA: Diagnosis not present

## 2017-10-18 DIAGNOSIS — Z955 Presence of coronary angioplasty implant and graft: Secondary | ICD-10-CM | POA: Diagnosis not present

## 2017-10-18 DIAGNOSIS — E1122 Type 2 diabetes mellitus with diabetic chronic kidney disease: Secondary | ICD-10-CM | POA: Diagnosis not present

## 2017-10-18 DIAGNOSIS — Z7982 Long term (current) use of aspirin: Secondary | ICD-10-CM | POA: Insufficient documentation

## 2017-10-18 DIAGNOSIS — Z87891 Personal history of nicotine dependence: Secondary | ICD-10-CM | POA: Insufficient documentation

## 2017-10-18 DIAGNOSIS — Z91041 Radiographic dye allergy status: Secondary | ICD-10-CM | POA: Insufficient documentation

## 2017-10-18 DIAGNOSIS — N400 Enlarged prostate without lower urinary tract symptoms: Secondary | ICD-10-CM | POA: Insufficient documentation

## 2017-10-18 DIAGNOSIS — Z79890 Hormone replacement therapy (postmenopausal): Secondary | ICD-10-CM | POA: Diagnosis not present

## 2017-10-18 DIAGNOSIS — Z841 Family history of disorders of kidney and ureter: Secondary | ICD-10-CM | POA: Insufficient documentation

## 2017-10-18 DIAGNOSIS — I2510 Atherosclerotic heart disease of native coronary artery without angina pectoris: Secondary | ICD-10-CM | POA: Insufficient documentation

## 2017-10-18 DIAGNOSIS — Z888 Allergy status to other drugs, medicaments and biological substances status: Secondary | ICD-10-CM | POA: Diagnosis not present

## 2017-10-18 DIAGNOSIS — I5030 Unspecified diastolic (congestive) heart failure: Secondary | ICD-10-CM | POA: Diagnosis not present

## 2017-10-18 DIAGNOSIS — Z8249 Family history of ischemic heart disease and other diseases of the circulatory system: Secondary | ICD-10-CM | POA: Diagnosis not present

## 2017-10-18 DIAGNOSIS — I5032 Chronic diastolic (congestive) heart failure: Secondary | ICD-10-CM | POA: Diagnosis not present

## 2017-10-18 DIAGNOSIS — N184 Chronic kidney disease, stage 4 (severe): Secondary | ICD-10-CM | POA: Insufficient documentation

## 2017-10-18 DIAGNOSIS — Z9889 Other specified postprocedural states: Secondary | ICD-10-CM | POA: Insufficient documentation

## 2017-10-18 DIAGNOSIS — N185 Chronic kidney disease, stage 5: Secondary | ICD-10-CM | POA: Diagnosis not present

## 2017-10-18 HISTORY — DX: Anemia, unspecified: D64.9

## 2017-10-18 HISTORY — PX: AV FISTULA PLACEMENT: SHX1204

## 2017-10-18 HISTORY — DX: Prediabetes: R73.03

## 2017-10-18 LAB — POCT I-STAT 4, (NA,K, GLUC, HGB,HCT)
GLUCOSE: 111 mg/dL — AB (ref 70–99)
HCT: 34 % — ABNORMAL LOW (ref 39.0–52.0)
Hemoglobin: 11.6 g/dL — ABNORMAL LOW (ref 13.0–17.0)
Potassium: 3.7 mmol/L (ref 3.5–5.1)
Sodium: 144 mmol/L (ref 135–145)

## 2017-10-18 LAB — GLUCOSE, CAPILLARY: Glucose-Capillary: 105 mg/dL — ABNORMAL HIGH (ref 70–99)

## 2017-10-18 SURGERY — ARTERIOVENOUS (AV) FISTULA CREATION
Anesthesia: Monitor Anesthesia Care | Site: Arm Upper | Laterality: Left

## 2017-10-18 MED ORDER — CHLORHEXIDINE GLUCONATE 4 % EX LIQD: 60.0000 mL | Freq: Once | CUTANEOUS | Status: DC

## 2017-10-18 MED ORDER — PROPOFOL 10 MG/ML IV BOLUS
INTRAVENOUS | Status: AC
  Filled 2017-10-18: qty 40

## 2017-10-18 MED ORDER — SODIUM CHLORIDE 0.9 % IV SOLN
INTRAVENOUS | Status: DC | PRN
  Administered 2017-10-18: 08:00:00

## 2017-10-18 MED ORDER — MEPERIDINE HCL 50 MG/ML IJ SOLN: 6.2500 mg | INTRAMUSCULAR | Status: DC | PRN

## 2017-10-18 MED ORDER — EPHEDRINE SULFATE 50 MG/ML IJ SOLN
INTRAMUSCULAR | Status: DC | PRN
  Administered 2017-10-18 (×2): 10 mg via INTRAVENOUS

## 2017-10-18 MED ORDER — HYDROMORPHONE HCL 1 MG/ML IJ SOLN: 0.2500 mg | INTRAMUSCULAR | Status: DC | PRN

## 2017-10-18 MED ORDER — FENTANYL CITRATE (PF) 250 MCG/5ML IJ SOLN
INTRAMUSCULAR | Status: AC
  Filled 2017-10-18: qty 5

## 2017-10-18 MED ORDER — SODIUM CHLORIDE 0.9 % IV SOLN: INTRAVENOUS | Status: DC

## 2017-10-18 MED ORDER — PROPOFOL 10 MG/ML IV BOLUS
INTRAVENOUS | Status: DC | PRN
  Administered 2017-10-18 (×2): 20 mg via INTRAVENOUS
  Administered 2017-10-18: 50 mg via INTRAVENOUS

## 2017-10-18 MED ORDER — AMLODIPINE BESYLATE 10 MG PO TABS
10.0000 mg | ORAL_TABLET | ORAL | Status: AC
  Administered 2017-10-18: 10 mg via ORAL
  Filled 2017-10-18 (×2): qty 1

## 2017-10-18 MED ORDER — LIDOCAINE 2% (20 MG/ML) 5 ML SYRINGE
INTRAMUSCULAR | Status: DC | PRN
  Administered 2017-10-18: 60 mg via INTRAVENOUS

## 2017-10-18 MED ORDER — PROPOFOL 500 MG/50ML IV EMUL
INTRAVENOUS | Status: DC | PRN
  Administered 2017-10-18: 75 ug/kg/min via INTRAVENOUS

## 2017-10-18 MED ORDER — ONDANSETRON HCL 4 MG/2ML IJ SOLN
INTRAMUSCULAR | Status: DC | PRN
  Administered 2017-10-18: 4 mg via INTRAVENOUS

## 2017-10-18 MED ORDER — 0.9 % SODIUM CHLORIDE (POUR BTL) OPTIME
TOPICAL | Status: DC | PRN
  Administered 2017-10-18: 1000 mL

## 2017-10-18 MED ORDER — CEFAZOLIN SODIUM-DEXTROSE 2-4 GM/100ML-% IV SOLN
2.0000 g | INTRAVENOUS | Status: AC
  Administered 2017-10-18: 2 g via INTRAVENOUS

## 2017-10-18 MED ORDER — FENTANYL CITRATE (PF) 100 MCG/2ML IJ SOLN
INTRAMUSCULAR | Status: DC | PRN
  Administered 2017-10-18: 25 ug via INTRAVENOUS

## 2017-10-18 MED ORDER — GLYCOPYRROLATE PF 0.2 MG/ML IJ SOSY
PREFILLED_SYRINGE | INTRAMUSCULAR | Status: AC
  Filled 2017-10-18: qty 1

## 2017-10-18 MED ORDER — EPHEDRINE SULFATE 50 MG/ML IJ SOLN
INTRAMUSCULAR | Status: AC
  Filled 2017-10-18: qty 1

## 2017-10-18 MED ORDER — SODIUM CHLORIDE 0.9 % IV SOLN
INTRAVENOUS | Status: AC
  Filled 2017-10-18: qty 1.2

## 2017-10-18 MED ORDER — OXYCODONE-ACETAMINOPHEN 5-325 MG PO TABS: 1.0000 | ORAL_TABLET | Freq: Four times a day (QID) | ORAL | 0 refills | Status: DC | PRN

## 2017-10-18 MED ORDER — LIDOCAINE-EPINEPHRINE (PF) 1 %-1:200000 IJ SOLN
INTRAMUSCULAR | Status: DC | PRN
  Administered 2017-10-18: 8 mL

## 2017-10-18 MED ORDER — SODIUM CHLORIDE 0.9 % IV SOLN
INTRAVENOUS | Status: DC | PRN
  Administered 2017-10-18: 07:00:00 via INTRAVENOUS

## 2017-10-18 MED ORDER — MIDAZOLAM HCL 2 MG/2ML IJ SOLN
INTRAMUSCULAR | Status: AC
  Filled 2017-10-18: qty 2

## 2017-10-18 MED ORDER — GLYCOPYRROLATE PF 0.2 MG/ML IJ SOSY
PREFILLED_SYRINGE | INTRAMUSCULAR | Status: DC | PRN
  Administered 2017-10-18 (×2): .1 mg via INTRAVENOUS

## 2017-10-18 MED ORDER — ONDANSETRON HCL 4 MG/2ML IJ SOLN: 4.0000 mg | Freq: Once | INTRAMUSCULAR | Status: DC | PRN

## 2017-10-18 MED ORDER — HYDRALAZINE HCL 50 MG PO TABS
100.0000 mg | ORAL_TABLET | Freq: Once | ORAL | Status: AC
  Administered 2017-10-18: 100 mg via ORAL
  Filled 2017-10-18 (×2): qty 2

## 2017-10-18 MED ORDER — SODIUM CHLORIDE 0.9 % IJ SOLN
INTRAMUSCULAR | Status: AC
  Filled 2017-10-18: qty 10

## 2017-10-18 MED ORDER — LIDOCAINE 2% (20 MG/ML) 5 ML SYRINGE
INTRAMUSCULAR | Status: AC
  Filled 2017-10-18: qty 5

## 2017-10-18 MED ORDER — ONDANSETRON HCL 4 MG/2ML IJ SOLN
INTRAMUSCULAR | Status: AC
  Filled 2017-10-18: qty 2

## 2017-10-18 MED ORDER — LIDOCAINE-EPINEPHRINE (PF) 1 %-1:200000 IJ SOLN
INTRAMUSCULAR | Status: AC
  Filled 2017-10-18: qty 30

## 2017-10-18 SURGICAL SUPPLY — 36 items
ADH SKN CLS APL DERMABOND .7 (GAUZE/BANDAGES/DRESSINGS) ×1
ADH SKN CLS LQ APL DERMABOND (GAUZE/BANDAGES/DRESSINGS) ×1
ARMBAND PINK RESTRICT EXTREMIT (MISCELLANEOUS) ×3 IMPLANT
CANISTER SUCT 3000ML PPV (MISCELLANEOUS) ×3 IMPLANT
CLIP VESOCCLUDE MED 6/CT (CLIP) ×3 IMPLANT
CLIP VESOCCLUDE SM WIDE 6/CT (CLIP) ×3 IMPLANT
COVER PROBE W GEL 5X96 (DRAPES) IMPLANT
DERMABOND ADHESIVE PROPEN (GAUZE/BANDAGES/DRESSINGS) ×2
DERMABOND ADVANCED (GAUZE/BANDAGES/DRESSINGS) ×2
DERMABOND ADVANCED .7 DNX12 (GAUZE/BANDAGES/DRESSINGS) ×1 IMPLANT
DERMABOND ADVANCED .7 DNX6 (GAUZE/BANDAGES/DRESSINGS) IMPLANT
ELECT REM PT RETURN 9FT ADLT (ELECTROSURGICAL) ×3
ELECTRODE REM PT RTRN 9FT ADLT (ELECTROSURGICAL) ×1 IMPLANT
GLOVE BIO SURGEON STRL SZ 6.5 (GLOVE) ×2 IMPLANT
GLOVE BIO SURGEON STRL SZ7.5 (GLOVE) ×3 IMPLANT
GLOVE BIO SURGEONS STRL SZ 6.5 (GLOVE) ×2
GLOVE BIOGEL PI IND STRL 6.5 (GLOVE) IMPLANT
GLOVE BIOGEL PI IND STRL 7.0 (GLOVE) IMPLANT
GLOVE BIOGEL PI INDICATOR 6.5 (GLOVE) ×2
GLOVE BIOGEL PI INDICATOR 7.0 (GLOVE) ×2
GOWN STRL REUS W/ TWL LRG LVL3 (GOWN DISPOSABLE) ×2 IMPLANT
GOWN STRL REUS W/ TWL XL LVL3 (GOWN DISPOSABLE) ×1 IMPLANT
GOWN STRL REUS W/TWL LRG LVL3 (GOWN DISPOSABLE) ×6
GOWN STRL REUS W/TWL XL LVL3 (GOWN DISPOSABLE) ×3
KIT BASIN OR (CUSTOM PROCEDURE TRAY) ×3 IMPLANT
KIT TURNOVER KIT B (KITS) ×3 IMPLANT
NS IRRIG 1000ML POUR BTL (IV SOLUTION) ×3 IMPLANT
PACK CV ACCESS (CUSTOM PROCEDURE TRAY) ×3 IMPLANT
PAD ARMBOARD 7.5X6 YLW CONV (MISCELLANEOUS) ×6 IMPLANT
SUT MNCRL AB 4-0 PS2 18 (SUTURE) ×3 IMPLANT
SUT PROLENE 6 0 BV (SUTURE) ×5 IMPLANT
SUT VIC AB 3-0 SH 27 (SUTURE) ×3
SUT VIC AB 3-0 SH 27X BRD (SUTURE) ×1 IMPLANT
TOWEL GREEN STERILE (TOWEL DISPOSABLE) ×3 IMPLANT
UNDERPAD 30X30 (UNDERPADS AND DIAPERS) ×3 IMPLANT
WATER STERILE IRR 1000ML POUR (IV SOLUTION) ×3 IMPLANT

## 2017-10-18 NOTE — OR Nursing (Signed)
Patient stated " no problems with latex or betadine".

## 2017-10-18 NOTE — Anesthesia Postprocedure Evaluation (Signed)
Anesthesia Post Note  Patient: Jeffrey Campbell  Procedure(s) Performed: ARTERIOVENOUS (AV) FISTULA CREATION ARM (Left Arm Upper)     Patient location during evaluation: PACU Anesthesia Type: MAC Level of consciousness: awake and alert Pain management: pain level controlled Vital Signs Assessment: post-procedure vital signs reviewed and stable Respiratory status: spontaneous breathing, nonlabored ventilation, respiratory function stable and patient connected to nasal cannula oxygen Cardiovascular status: stable and blood pressure returned to baseline Postop Assessment: no apparent nausea or vomiting Anesthetic complications: no    Last Vitals:  Vitals:   10/18/17 0907 10/18/17 0930  BP: (!) 126/50 (!) 143/54  Pulse: (!) 55 (!) 55  Resp: 17 16  Temp: 37 C 36.8 C  SpO2: 95% 97%    Last Pain:  Vitals:   10/18/17 0833  TempSrc:   PainSc: Asleep                 Caylin Nass DAVID

## 2017-10-18 NOTE — Anesthesia Procedure Notes (Signed)
Procedure Name: MAC Date/Time: 10/18/2017 7:33 AM Performed by: Harden Mo, CRNA Pre-anesthesia Checklist: Patient identified, Emergency Drugs available, Suction available and Patient being monitored Patient Re-evaluated:Patient Re-evaluated prior to induction Oxygen Delivery Method: Simple face mask Preoxygenation: Pre-oxygenation with 100% oxygen Induction Type: IV induction Placement Confirmation: positive ETCO2 and breath sounds checked- equal and bilateral Dental Injury: Teeth and Oropharynx as per pre-operative assessment

## 2017-10-18 NOTE — Anesthesia Preprocedure Evaluation (Addendum)
Anesthesia Evaluation  Patient identified by MRN, date of birth, ID band Patient awake    Reviewed: Allergy & Precautions, NPO status , Patient's Chart, lab work & pertinent test results  Airway Mallampati: I  TM Distance: >3 FB Neck ROM: Full    Dental  (+) Dental Advisory Given, Teeth Intact   Pulmonary COPD, former smoker,    Pulmonary exam normal        Cardiovascular hypertension, Pt. on medications + CAD and + Peripheral Vascular Disease  Normal cardiovascular exam+ Valvular Problems/Murmurs MR      Neuro/Psych Dementia    GI/Hepatic PUD,   Endo/Other  Hyperthyroidism   Renal/GU Renal InsufficiencyRenal disease     Musculoskeletal   Abdominal   Peds  Hematology   Anesthesia Other Findings   Reproductive/Obstetrics                           Anesthesia Physical Anesthesia Plan  ASA: III  Anesthesia Plan: MAC   Post-op Pain Management:    Induction: Intravenous  PONV Risk Score and Plan: 1 and Treatment may vary due to age or medical condition  Airway Management Planned: Simple Face Mask  Additional Equipment:   Intra-op Plan:   Post-operative Plan:   Informed Consent: I have reviewed the patients History and Physical, chart, labs and discussed the procedure including the risks, benefits and alternatives for the proposed anesthesia with the patient or authorized representative who has indicated his/her understanding and acceptance.     Plan Discussed with: CRNA and Surgeon  Anesthesia Plan Comments:         Anesthesia Quick Evaluation

## 2017-10-18 NOTE — Transfer of Care (Signed)
Immediate Anesthesia Transfer of Care Note  Patient: Jeffrey Campbell  Procedure(s) Performed: ARTERIOVENOUS (AV) FISTULA CREATION ARM (Left Arm Upper)  Patient Location: PACU  Anesthesia Type:MAC  Level of Consciousness: awake, alert  and oriented  Airway & Oxygen Therapy: Patient Spontanous Breathing  Post-op Assessment: Report given to RN, Post -op Vital signs reviewed and stable and Patient moving all extremities X 4  Post vital signs: Reviewed and stable  Last Vitals:  Vitals Value Taken Time  BP 135/50 10/18/2017  8:33 AM  Temp    Pulse 61 10/18/2017  8:33 AM  Resp 13 10/18/2017  8:33 AM  SpO2 96 % 10/18/2017  8:33 AM  Vitals shown include unvalidated device data.  Last Pain:  Vitals:   10/18/17 0602  TempSrc:   PainSc: 0-No pain      Patients Stated Pain Goal: 5 (39/03/00 9233)  Complications: No apparent anesthesia complications

## 2017-10-18 NOTE — Op Note (Signed)
    Patient name: KIRE FERG MRN: [id number] DOB: [birth year] Sex: male  10/18/2017 Pre-operative Diagnosis: Chronic kidney disease Post-operative diagnosis:  Same Surgeon:  Eda Paschal. Donzetta Matters, MD Assistant: Leontine Locket, PA Procedure Performed: 1.  Left brachiocephalic AV fistula creation 2.  Ligation of mid upper arm sidebranch  Indications: 84 year old male with chronic kidney disease now progressing towards dialysis.  He is not in need of catheter at this time.  He has suitable vein by physical exam and no large easily palpable brachial artery for fistula creation.  Findings: Brachial arteries approximately 6 mm in diameter at the antecubitum.  Cephalic vein easily dilated to 4 mm.  I completion there is a palpable thrill in the runoff vein as well as a palpable radial pulse.   Procedure:  The patient was identified in the holding area and taken to the operating room where he was placed supine on the operative table and MAC anesthesia was induced.  He was sterilely prepped and draped in the left upper extremity usual fashion he was given antibiotics and a timeout was called.  We began with transverse incision overlying the palpable brachial arterial pulse as well as the identifiable cephalic vein just below the antecubitum.  I first dissected out the vein marked for orientation.  Then dissected to the deep fascia placed a vessel loop around the brachial artery.  The vein was then transected distally and tied off with 2-0 silk.  It easily dilated to 4 mm and was flushed with heparinized saline.  The artery was then clamped distally proximally opened longitudinally and flushed with heparinized saline both directions.  The vein was then sewn end-to-side with 6-0 Prolene suture.  Prior to completion anastomosis usual flushing techniques were undertaken.  Upon completion there was a palpable thrill in the renal vein as well as palpable radial pulse both confirmed with Doppler.  There was a large  sidebranch in the mid upper arm and a transverse incision was made after 1% lidocaine was injected.  I dissected down to divided between a clip at the time.  Incisions were irrigated hemostasis obtained we closed that incision with a 4-0 Monocryl and the incision at the antecubitum with a 3-0 Vicryl in 2 layers.  Dermabond was placed to both sides.  He tolerated procedure well without immediate comp occasion.  All counts were correct at completion.  Next  EBL 20 cc.    Dajanee Voorheis C. Donzetta Matters, MD [facility] Office: [phone] Pager: [phone]

## 2017-10-18 NOTE — Telephone Encounter (Signed)
sch appt spk to pt 12/02/17 1pm Dialysis Duplex 2pm p/o PA

## 2017-10-18 NOTE — H&P (Signed)
   History and Physical Update  The patient was interviewed and re-examined.  The patient's previous History and Physical has been reviewed and is unchanged from recent office visit. Appears to have adequate cephalic vein on the left to use for av fistula. Again reiterated the risks and benefits and he is very knowledgeable about hd access.  Brandon C. Donzetta Matters, MD [facility] [place] Office: [phone] Pager: [phone]   10/18/2017, 7:07 AM

## 2017-10-18 NOTE — Discharge Instructions (Signed)
° °  [facility]  Discharge Instructions  AV Fistula or Graft Surgery for Dialysis Access  Please refer to the following instructions for your post-procedure care. Your surgeon or physician assistant will discuss any changes with you.  Activity  You may drive the day following your surgery, if you are comfortable and no longer taking prescription pain medication. Resume full activity as the soreness in your incision resolves.  Bathing/Showering  You may shower after you go home. Keep your incision dry for 48 hours. Do not soak in a bathtub, hot tub, or swim until the incision heals completely. You may not shower if you have a hemodialysis catheter.  Incision Care  Clean your incision with mild soap and water after 48 hours. Pat the area dry with a clean towel. You do not need a bandage unless otherwise instructed. Do not apply any ointments or creams to your incision. You may have skin glue on your incision. Do not peel it off. It will come off on its own in about one week. Your arm may swell a bit after surgery. To reduce swelling use pillows to elevate your arm so it is above your heart. Your doctor will tell you if you need to lightly wrap your arm with an ACE bandage.  Diet  Resume your normal diet. There are not special food restrictions following this procedure. In order to heal from your surgery, it is CRITICAL to get adequate nutrition. Your body requires vitamins, minerals, and protein. Vegetables are the best source of vitamins and minerals. Vegetables also provide the perfect balance of protein. Processed food has little nutritional value, so try to avoid this.  Medications  Resume taking all of your medications. If your incision is causing pain, you may take over-the counter pain relievers such as acetaminophen (Tylenol). If you were prescribed a stronger pain medication, please be aware these medications can cause nausea and constipation. Prevent  nausea by taking the medication with a snack or meal. Avoid constipation by drinking plenty of fluids and eating foods with high amount of fiber, such as fruits, vegetables, and grains.  Do not take Tylenol if you are taking prescription pain medications.  Follow up Your surgeon may want to see you in the office following your access surgery. If so, this will be arranged at the time of your surgery.  Please call us immediately for any of the following conditions:  Increased pain, redness, drainage (pus) from your incision site Fever of 101 degrees or higher Severe or worsening pain at your incision site Hand pain or numbness.  Reduce your risk of vascular disease:  Stop smoking. If you would like help, call [facility] at 1-800-QUIT-NOW 225-622-0655) or [place] at [place] your cholesterol Maintain a desired weight Control your diabetes Keep your blood pressure down  Dialysis  It will take several weeks to several months for your new dialysis access to be ready for use. Your surgeon will determine when it is okay to use it. Your nephrologist will continue to direct your dialysis. You can continue to use your Permcath until your new access is ready for use.   10/18/2017 Jeffrey Campbell [id number] September 12, 1932  Surgeon(s): Waynetta Sandy, MD  Procedure(s): Creation left brachiocephalic AV fistula  x Do not stick fistula for 12 weeks    If you have any questions, please call the office at [phone].

## 2017-10-19 ENCOUNTER — Encounter (HOSPITAL_COMMUNITY): Payer: Self-pay | Admitting: Vascular Surgery

## 2017-10-19 ENCOUNTER — Other Ambulatory Visit: Payer: Self-pay

## 2017-10-19 DIAGNOSIS — Z48812 Encounter for surgical aftercare following surgery on the circulatory system: Secondary | ICD-10-CM

## 2017-10-19 DIAGNOSIS — I120 Hypertensive chronic kidney disease with stage 5 chronic kidney disease or end stage renal disease: Secondary | ICD-10-CM | POA: Diagnosis not present

## 2017-10-19 DIAGNOSIS — E0590 Thyrotoxicosis, unspecified without thyrotoxic crisis or storm: Secondary | ICD-10-CM | POA: Diagnosis not present

## 2017-10-19 DIAGNOSIS — N185 Chronic kidney disease, stage 5: Secondary | ICD-10-CM | POA: Diagnosis not present

## 2017-10-19 DIAGNOSIS — E1122 Type 2 diabetes mellitus with diabetic chronic kidney disease: Secondary | ICD-10-CM | POA: Diagnosis not present

## 2017-10-19 DIAGNOSIS — I2510 Atherosclerotic heart disease of native coronary artery without angina pectoris: Secondary | ICD-10-CM | POA: Diagnosis not present

## 2017-10-20 ENCOUNTER — Ambulatory Visit (HOSPITAL_COMMUNITY)
Admission: RE | Admit: 2017-10-20 | Discharge: 2017-10-20 | Disposition: A | Discharge status: Home / Self Care | Payer: Medicare Other | Source: Ambulatory Visit | Attending: Nephrology | Admitting: Nephrology

## 2017-10-20 VITALS — BP 156/101 | HR 106

## 2017-10-20 DIAGNOSIS — N183 Chronic kidney disease, stage 3 (moderate): Secondary | ICD-10-CM | POA: Diagnosis not present

## 2017-10-20 DIAGNOSIS — D631 Anemia in chronic kidney disease: Secondary | ICD-10-CM | POA: Insufficient documentation

## 2017-10-20 DIAGNOSIS — H401121 Primary open-angle glaucoma, left eye, mild stage: Secondary | ICD-10-CM | POA: Diagnosis not present

## 2017-10-20 DIAGNOSIS — I130 Hypertensive heart and chronic kidney disease with heart failure and stage 1 through stage 4 chronic kidney disease, or unspecified chronic kidney disease: Secondary | ICD-10-CM

## 2017-10-20 DIAGNOSIS — H401113 Primary open-angle glaucoma, right eye, severe stage: Secondary | ICD-10-CM | POA: Diagnosis not present

## 2017-10-20 LAB — POCT HEMOGLOBIN-HEMACUE: HEMOGLOBIN: 11 g/dL — AB (ref 13.0–17.0)

## 2017-10-20 MED ORDER — EPOETIN ALFA 10000 UNIT/ML IJ SOLN
5000.0000 [IU] | INTRAMUSCULAR | Status: DC
  Administered 2017-10-20: 5000 [IU] via SUBCUTANEOUS

## 2017-10-20 MED ORDER — EPOETIN ALFA 10000 UNIT/ML IJ SOLN
INTRAMUSCULAR | Status: AC
  Administered 2017-10-20: 5000 [IU] via SUBCUTANEOUS
  Filled 2017-10-20: qty 1

## 2017-10-21 DIAGNOSIS — I120 Hypertensive chronic kidney disease with stage 5 chronic kidney disease or end stage renal disease: Secondary | ICD-10-CM | POA: Diagnosis not present

## 2017-10-21 DIAGNOSIS — Z48812 Encounter for surgical aftercare following surgery on the circulatory system: Secondary | ICD-10-CM | POA: Diagnosis not present

## 2017-10-21 DIAGNOSIS — N185 Chronic kidney disease, stage 5: Secondary | ICD-10-CM | POA: Diagnosis not present

## 2017-10-21 DIAGNOSIS — E0590 Thyrotoxicosis, unspecified without thyrotoxic crisis or storm: Secondary | ICD-10-CM | POA: Diagnosis not present

## 2017-10-21 DIAGNOSIS — I2510 Atherosclerotic heart disease of native coronary artery without angina pectoris: Secondary | ICD-10-CM | POA: Diagnosis not present

## 2017-10-21 DIAGNOSIS — E1122 Type 2 diabetes mellitus with diabetic chronic kidney disease: Secondary | ICD-10-CM | POA: Diagnosis not present

## 2017-10-22 DIAGNOSIS — I120 Hypertensive chronic kidney disease with stage 5 chronic kidney disease or end stage renal disease: Secondary | ICD-10-CM | POA: Diagnosis not present

## 2017-10-22 DIAGNOSIS — I2510 Atherosclerotic heart disease of native coronary artery without angina pectoris: Secondary | ICD-10-CM | POA: Diagnosis not present

## 2017-10-22 DIAGNOSIS — E1122 Type 2 diabetes mellitus with diabetic chronic kidney disease: Secondary | ICD-10-CM | POA: Diagnosis not present

## 2017-10-22 DIAGNOSIS — E0590 Thyrotoxicosis, unspecified without thyrotoxic crisis or storm: Secondary | ICD-10-CM | POA: Diagnosis not present

## 2017-10-22 DIAGNOSIS — N185 Chronic kidney disease, stage 5: Secondary | ICD-10-CM | POA: Diagnosis not present

## 2017-10-22 DIAGNOSIS — Z48812 Encounter for surgical aftercare following surgery on the circulatory system: Secondary | ICD-10-CM | POA: Diagnosis not present

## 2017-10-24 DIAGNOSIS — I120 Hypertensive chronic kidney disease with stage 5 chronic kidney disease or end stage renal disease: Secondary | ICD-10-CM | POA: Diagnosis not present

## 2017-10-24 DIAGNOSIS — I2510 Atherosclerotic heart disease of native coronary artery without angina pectoris: Secondary | ICD-10-CM | POA: Diagnosis not present

## 2017-10-24 DIAGNOSIS — E0590 Thyrotoxicosis, unspecified without thyrotoxic crisis or storm: Secondary | ICD-10-CM | POA: Diagnosis not present

## 2017-10-24 DIAGNOSIS — N185 Chronic kidney disease, stage 5: Secondary | ICD-10-CM | POA: Diagnosis not present

## 2017-10-24 DIAGNOSIS — E1122 Type 2 diabetes mellitus with diabetic chronic kidney disease: Secondary | ICD-10-CM | POA: Diagnosis not present

## 2017-10-24 DIAGNOSIS — Z48812 Encounter for surgical aftercare following surgery on the circulatory system: Secondary | ICD-10-CM | POA: Diagnosis not present

## 2017-10-26 DIAGNOSIS — E890 Postprocedural hypothyroidism: Secondary | ICD-10-CM | POA: Diagnosis not present

## 2017-10-27 ENCOUNTER — Ambulatory Visit (HOSPITAL_COMMUNITY)
Admission: RE | Admit: 2017-10-27 | Discharge: 2017-10-27 | Disposition: A | Discharge status: Home / Self Care | Payer: Medicare Other | Source: Ambulatory Visit | Attending: Nephrology | Admitting: Nephrology

## 2017-10-27 VITALS — BP 150/78 | HR 41 | Resp 16

## 2017-10-27 DIAGNOSIS — I509 Heart failure, unspecified: Secondary | ICD-10-CM | POA: Diagnosis not present

## 2017-10-27 DIAGNOSIS — E0590 Thyrotoxicosis, unspecified without thyrotoxic crisis or storm: Secondary | ICD-10-CM | POA: Diagnosis not present

## 2017-10-27 DIAGNOSIS — I2510 Atherosclerotic heart disease of native coronary artery without angina pectoris: Secondary | ICD-10-CM | POA: Diagnosis not present

## 2017-10-27 DIAGNOSIS — D631 Anemia in chronic kidney disease: Secondary | ICD-10-CM | POA: Insufficient documentation

## 2017-10-27 DIAGNOSIS — N183 Chronic kidney disease, stage 3 (moderate): Secondary | ICD-10-CM | POA: Insufficient documentation

## 2017-10-27 DIAGNOSIS — I120 Hypertensive chronic kidney disease with stage 5 chronic kidney disease or end stage renal disease: Secondary | ICD-10-CM | POA: Diagnosis not present

## 2017-10-27 DIAGNOSIS — E1122 Type 2 diabetes mellitus with diabetic chronic kidney disease: Secondary | ICD-10-CM | POA: Diagnosis not present

## 2017-10-27 DIAGNOSIS — Z48812 Encounter for surgical aftercare following surgery on the circulatory system: Secondary | ICD-10-CM | POA: Diagnosis not present

## 2017-10-27 DIAGNOSIS — I130 Hypertensive heart and chronic kidney disease with heart failure and stage 1 through stage 4 chronic kidney disease, or unspecified chronic kidney disease: Secondary | ICD-10-CM | POA: Insufficient documentation

## 2017-10-27 DIAGNOSIS — N185 Chronic kidney disease, stage 5: Secondary | ICD-10-CM | POA: Diagnosis not present

## 2017-10-27 LAB — POCT HEMOGLOBIN-HEMACUE: Hemoglobin: 11.5 g/dL — ABNORMAL LOW (ref 13.0–17.0)

## 2017-10-27 MED ORDER — EPOETIN ALFA 10000 UNIT/ML IJ SOLN
INTRAMUSCULAR | Status: AC
  Filled 2017-10-27: qty 1

## 2017-10-27 MED ORDER — EPOETIN ALFA 10000 UNIT/ML IJ SOLN
5000.0000 [IU] | INTRAMUSCULAR | Status: DC
  Administered 2017-10-27: 5000 [IU] via SUBCUTANEOUS

## 2017-11-01 DIAGNOSIS — I2510 Atherosclerotic heart disease of native coronary artery without angina pectoris: Secondary | ICD-10-CM | POA: Diagnosis not present

## 2017-11-01 DIAGNOSIS — Z48812 Encounter for surgical aftercare following surgery on the circulatory system: Secondary | ICD-10-CM | POA: Diagnosis not present

## 2017-11-01 DIAGNOSIS — I120 Hypertensive chronic kidney disease with stage 5 chronic kidney disease or end stage renal disease: Secondary | ICD-10-CM | POA: Diagnosis not present

## 2017-11-01 DIAGNOSIS — N185 Chronic kidney disease, stage 5: Secondary | ICD-10-CM | POA: Diagnosis not present

## 2017-11-01 DIAGNOSIS — E1122 Type 2 diabetes mellitus with diabetic chronic kidney disease: Secondary | ICD-10-CM | POA: Diagnosis not present

## 2017-11-01 DIAGNOSIS — E0590 Thyrotoxicosis, unspecified without thyrotoxic crisis or storm: Secondary | ICD-10-CM | POA: Diagnosis not present

## 2017-11-03 ENCOUNTER — Ambulatory Visit (HOSPITAL_COMMUNITY)
Admission: RE | Admit: 2017-11-03 | Discharge: 2017-11-03 | Disposition: A | Discharge status: Home / Self Care | Payer: Medicare Other | Source: Ambulatory Visit | Attending: Nephrology | Admitting: Nephrology

## 2017-11-03 VITALS — BP 161/50 | HR 100 | Temp 98.6°F | Resp 15 | Ht 69.0 in | Wt 130.0 lb

## 2017-11-03 DIAGNOSIS — Z48812 Encounter for surgical aftercare following surgery on the circulatory system: Secondary | ICD-10-CM | POA: Diagnosis not present

## 2017-11-03 DIAGNOSIS — N183 Chronic kidney disease, stage 3 (moderate): Secondary | ICD-10-CM | POA: Diagnosis not present

## 2017-11-03 DIAGNOSIS — I130 Hypertensive heart and chronic kidney disease with heart failure and stage 1 through stage 4 chronic kidney disease, or unspecified chronic kidney disease: Secondary | ICD-10-CM

## 2017-11-03 DIAGNOSIS — E1122 Type 2 diabetes mellitus with diabetic chronic kidney disease: Secondary | ICD-10-CM | POA: Diagnosis not present

## 2017-11-03 DIAGNOSIS — N185 Chronic kidney disease, stage 5: Secondary | ICD-10-CM | POA: Diagnosis not present

## 2017-11-03 DIAGNOSIS — E0590 Thyrotoxicosis, unspecified without thyrotoxic crisis or storm: Secondary | ICD-10-CM | POA: Diagnosis not present

## 2017-11-03 DIAGNOSIS — I120 Hypertensive chronic kidney disease with stage 5 chronic kidney disease or end stage renal disease: Secondary | ICD-10-CM | POA: Diagnosis not present

## 2017-11-03 DIAGNOSIS — I2510 Atherosclerotic heart disease of native coronary artery without angina pectoris: Secondary | ICD-10-CM | POA: Diagnosis not present

## 2017-11-03 LAB — POCT HEMOGLOBIN-HEMACUE: Hemoglobin: 11.4 g/dL — ABNORMAL LOW (ref 13.0–17.0)

## 2017-11-03 MED ORDER — EPOETIN ALFA 10000 UNIT/ML IJ SOLN
5000.0000 [IU] | INTRAMUSCULAR | Status: DC
  Administered 2017-11-03: 5000 [IU] via SUBCUTANEOUS

## 2017-11-03 MED ORDER — EPOETIN ALFA 10000 UNIT/ML IJ SOLN
INTRAMUSCULAR | Status: AC
  Filled 2017-11-03: qty 1

## 2017-11-08 DIAGNOSIS — N185 Chronic kidney disease, stage 5: Secondary | ICD-10-CM | POA: Diagnosis not present

## 2017-11-08 DIAGNOSIS — E1122 Type 2 diabetes mellitus with diabetic chronic kidney disease: Secondary | ICD-10-CM | POA: Diagnosis not present

## 2017-11-08 DIAGNOSIS — E0590 Thyrotoxicosis, unspecified without thyrotoxic crisis or storm: Secondary | ICD-10-CM | POA: Diagnosis not present

## 2017-11-08 DIAGNOSIS — I2510 Atherosclerotic heart disease of native coronary artery without angina pectoris: Secondary | ICD-10-CM | POA: Diagnosis not present

## 2017-11-08 DIAGNOSIS — I120 Hypertensive chronic kidney disease with stage 5 chronic kidney disease or end stage renal disease: Secondary | ICD-10-CM | POA: Diagnosis not present

## 2017-11-08 DIAGNOSIS — Z48812 Encounter for surgical aftercare following surgery on the circulatory system: Secondary | ICD-10-CM | POA: Diagnosis not present

## 2017-11-10 ENCOUNTER — Ambulatory Visit (HOSPITAL_COMMUNITY)
Admission: RE | Admit: 2017-11-10 | Discharge: 2017-11-10 | Disposition: A | Discharge status: Home / Self Care | Payer: Medicare Other | Source: Ambulatory Visit | Attending: Nephrology | Admitting: Nephrology

## 2017-11-10 VITALS — BP 195/58 | HR 44 | Temp 97.7°F | Ht 69.0 in | Wt 127.0 lb

## 2017-11-10 DIAGNOSIS — N183 Chronic kidney disease, stage 3 (moderate): Secondary | ICD-10-CM | POA: Diagnosis not present

## 2017-11-10 DIAGNOSIS — I2510 Atherosclerotic heart disease of native coronary artery without angina pectoris: Secondary | ICD-10-CM | POA: Diagnosis not present

## 2017-11-10 DIAGNOSIS — I120 Hypertensive chronic kidney disease with stage 5 chronic kidney disease or end stage renal disease: Secondary | ICD-10-CM | POA: Diagnosis not present

## 2017-11-10 DIAGNOSIS — D631 Anemia in chronic kidney disease: Secondary | ICD-10-CM | POA: Insufficient documentation

## 2017-11-10 DIAGNOSIS — N185 Chronic kidney disease, stage 5: Secondary | ICD-10-CM | POA: Diagnosis not present

## 2017-11-10 DIAGNOSIS — E0590 Thyrotoxicosis, unspecified without thyrotoxic crisis or storm: Secondary | ICD-10-CM | POA: Diagnosis not present

## 2017-11-10 DIAGNOSIS — E1122 Type 2 diabetes mellitus with diabetic chronic kidney disease: Secondary | ICD-10-CM | POA: Diagnosis not present

## 2017-11-10 DIAGNOSIS — Z48812 Encounter for surgical aftercare following surgery on the circulatory system: Secondary | ICD-10-CM | POA: Diagnosis not present

## 2017-11-10 DIAGNOSIS — I130 Hypertensive heart and chronic kidney disease with heart failure and stage 1 through stage 4 chronic kidney disease, or unspecified chronic kidney disease: Secondary | ICD-10-CM

## 2017-11-10 LAB — POCT HEMOGLOBIN-HEMACUE: HEMOGLOBIN: 11.2 g/dL — AB (ref 13.0–17.0)

## 2017-11-10 LAB — IRON AND TIBC
Iron: 45 ug/dL (ref 45–182)
SATURATION RATIOS: 18 % (ref 17.9–39.5)
TIBC: 248 ug/dL — AB (ref 250–450)
UIBC: 203 ug/dL

## 2017-11-10 LAB — FERRITIN: Ferritin: 152 ng/mL (ref 24–336)

## 2017-11-10 MED ORDER — EPOETIN ALFA 10000 UNIT/ML IJ SOLN
INTRAMUSCULAR | Status: AC
  Administered 2017-11-10: 5000 [IU] via SUBCUTANEOUS
  Filled 2017-11-10: qty 1

## 2017-11-10 MED ORDER — EPOETIN ALFA 10000 UNIT/ML IJ SOLN
5000.0000 [IU] | INTRAMUSCULAR | Status: DC
  Administered 2017-11-10: 5000 [IU] via SUBCUTANEOUS

## 2017-11-17 ENCOUNTER — Ambulatory Visit (HOSPITAL_COMMUNITY)
Admission: RE | Admit: 2017-11-17 | Discharge: 2017-11-17 | Disposition: A | Discharge status: Home / Self Care | Payer: Medicare Other | Source: Ambulatory Visit | Attending: Nephrology | Admitting: Nephrology

## 2017-11-17 VITALS — BP 151/53 | HR 87 | Temp 97.5°F

## 2017-11-17 DIAGNOSIS — D631 Anemia in chronic kidney disease: Secondary | ICD-10-CM | POA: Diagnosis not present

## 2017-11-17 DIAGNOSIS — Z79899 Other long term (current) drug therapy: Secondary | ICD-10-CM | POA: Diagnosis not present

## 2017-11-17 DIAGNOSIS — N183 Chronic kidney disease, stage 3 (moderate): Secondary | ICD-10-CM | POA: Insufficient documentation

## 2017-11-17 DIAGNOSIS — Z5181 Encounter for therapeutic drug level monitoring: Secondary | ICD-10-CM | POA: Insufficient documentation

## 2017-11-17 DIAGNOSIS — I130 Hypertensive heart and chronic kidney disease with heart failure and stage 1 through stage 4 chronic kidney disease, or unspecified chronic kidney disease: Secondary | ICD-10-CM

## 2017-11-17 LAB — POCT HEMOGLOBIN-HEMACUE: HEMOGLOBIN: 11.5 g/dL — AB (ref 13.0–17.0)

## 2017-11-17 MED ORDER — EPOETIN ALFA 10000 UNIT/ML IJ SOLN
INTRAMUSCULAR | Status: AC
  Filled 2017-11-17: qty 1

## 2017-11-17 MED ORDER — EPOETIN ALFA 10000 UNIT/ML IJ SOLN
5000.0000 [IU] | INTRAMUSCULAR | Status: DC
  Administered 2017-11-17: 5000 [IU] via SUBCUTANEOUS

## 2017-11-24 ENCOUNTER — Ambulatory Visit (HOSPITAL_COMMUNITY)
Admission: RE | Admit: 2017-11-24 | Discharge: 2017-11-24 | Disposition: A | Discharge status: Home / Self Care | Payer: Medicare Other | Source: Ambulatory Visit | Attending: Nephrology | Admitting: Nephrology

## 2017-11-24 VITALS — BP 197/63 | HR 44 | Temp 97.5°F

## 2017-11-24 DIAGNOSIS — I130 Hypertensive heart and chronic kidney disease with heart failure and stage 1 through stage 4 chronic kidney disease, or unspecified chronic kidney disease: Secondary | ICD-10-CM | POA: Insufficient documentation

## 2017-11-24 DIAGNOSIS — N183 Chronic kidney disease, stage 3 (moderate): Secondary | ICD-10-CM | POA: Diagnosis not present

## 2017-11-24 LAB — POCT HEMOGLOBIN-HEMACUE: Hemoglobin: 10.8 g/dL — ABNORMAL LOW (ref 13.0–17.0)

## 2017-11-24 MED ORDER — EPOETIN ALFA 10000 UNIT/ML IJ SOLN
INTRAMUSCULAR | Status: AC
  Filled 2017-11-24: qty 1

## 2017-11-24 MED ORDER — EPOETIN ALFA 10000 UNIT/ML IJ SOLN
5000.0000 [IU] | INTRAMUSCULAR | Status: DC
  Administered 2017-11-24: 5000 [IU] via SUBCUTANEOUS

## 2017-11-25 ENCOUNTER — Ambulatory Visit (INDEPENDENT_AMBULATORY_CARE_PROVIDER_SITE_OTHER): Payer: Medicare Other | Admitting: Endocrinology

## 2017-11-25 ENCOUNTER — Encounter: Payer: Self-pay | Admitting: Endocrinology

## 2017-11-25 VITALS — BP 158/60 | HR 52 | Ht 68.0 in | Wt 135.0 lb

## 2017-11-25 DIAGNOSIS — N185 Chronic kidney disease, stage 5: Secondary | ICD-10-CM | POA: Diagnosis not present

## 2017-11-25 DIAGNOSIS — R29890 Loss of height: Secondary | ICD-10-CM

## 2017-11-25 DIAGNOSIS — N2581 Secondary hyperparathyroidism of renal origin: Secondary | ICD-10-CM | POA: Diagnosis not present

## 2017-11-25 DIAGNOSIS — I6523 Occlusion and stenosis of bilateral carotid arteries: Secondary | ICD-10-CM | POA: Diagnosis not present

## 2017-11-25 LAB — VITAMIN D 25 HYDROXY (VIT D DEFICIENCY, FRACTURES): VITD: 35 ng/mL (ref 30.00–100.00)

## 2017-11-25 NOTE — Progress Notes (Signed)
Patient ID: Jeffrey Campbell, male   DOB: [birth year], 84 y.o.   MRN: [id number]          Chief complaint: High calcium  History of Present Illness:  Referring physician: [name]    Patient has been referred by his nephrologist for high parathyroid hormone level Review of records show reduced level available only from 6/19 and this was 199, previous records not available for previous level He only has had a high calcium level only once of 10.4 done in 6/19, previously 10.0 Previously available calcium results:  Lab Results  Component Value Date   CALCIUM 9.5 01/13/2017   CALCIUM 9.6 11/03/2016   CALCIUM 8.7 10/18/2016   CALCIUM 8.7 (L) 10/16/2016   CALCIUM 9.1 10/15/2016   CALCIUM 9.1 09/24/2016   CALCIUM 9.5 09/23/2016   CALCIUM 9.6 09/22/2016   CALCIUM 9.3 09/21/2016    His last GFR was 13 done in 6/19  The hyperparathyroidism is not associated with any known history of fractures, kidney stones, no history of any known cancer  Prior serologic and radiologic studies have included:  Lab Results  Component Value Date   CALCIUM 9.5 01/13/2017   PHOS 4.4 09/07/2016      25 (OH) Vitamin D level not available  No results found for: VD25OH    Allergies as of 11/25/2017      Reactions   Iodinated Diagnostic Agents Other (See Comments)   Right heart cath 07/2016 allegedly "shut down his kidneys" Patient has stage III chronic kidney disease   Zocor [simvastatin] Other (See Comments)   Liver problems    Budesonide-formoterol Fumarate Other (See Comments)   Dry mouth   Minoxidil Other (See Comments)   Tiotropium Bromide Monohydrate Other (See Comments)   Dry mouth   Tricor [fenofibrate]          Medication List        Accurate as of 11/25/17  9:11 AM. Always use your most recent med list.          amLODipine 10 MG tablet Commonly known as:  NORVASC Take 1 tablet (10 mg total) by mouth daily.   aspirin EC 81 MG tablet Take 81 mg by mouth daily.     atorvastatin 20 MG tablet Commonly known as:  LIPITOR TAKE 1 TABLET BY MOUTH EVERY DAY   doxazosin 2 MG tablet Commonly known as:  CARDURA Take 1 tablet (2 mg total) daily by mouth.   feeding supplement (ENSURE ENLIVE) Liqd Take 237 mLs by mouth 2 (two) times daily between meals.   ferrous sulfate 325 (65 FE) MG tablet Take 325 mg by mouth daily with breakfast.   furosemide 40 MG tablet Commonly known as:  LASIX Take 1 tablet (40 mg total) by mouth every other day.   hydrALAZINE 100 MG tablet Commonly known as:  APRESOLINE Take 1 tablet (100 mg total) by mouth 3 (three) times daily. Please make yearly appt with Dr. Radford Pax for October. 1st attempt   levothyroxine 137 MCG tablet Commonly known as:  SYNTHROID, LEVOTHROID Take 137 mcg by mouth daily before breakfast.   multivitamin with minerals Tabs tablet Take 1 tablet by mouth daily.   oxyCODONE-acetaminophen 5-325 MG tablet Commonly known as:  PERCOCET/ROXICET Take 1 tablet by mouth every 6 (six) hours as needed for severe pain.   potassium chloride 10 MEQ tablet Commonly known as:  K-DUR,KLOR-CON Take 1 tablet (10 mEq total) by mouth daily.   QUEtiapine 25 MG tablet Commonly known as:  SEROQUEL  Take 1 tablet (25 mg total) by mouth at bedtime.   SIMBRINZA 1-0.2 % Susp Generic drug:  Brinzolamide-Brimonidine Place 1 drop into both eyes 2 (two) times daily.       Allergies:  Allergies  Allergen Reactions  . Iodinated Diagnostic Agents Other (See Comments)    Right heart cath 07/2016 allegedly "shut down his kidneys" Patient has stage III chronic kidney disease  . Zocor [Simvastatin] Other (See Comments)    Liver problems   . Budesonide-Formoterol Fumarate Other (See Comments)    Dry mouth  . Minoxidil Other (See Comments)  . Tiotropium Bromide Monohydrate Other (See Comments)    Dry mouth  . Tricor [Fenofibrate]          Past Medical History:  Diagnosis Date  . Anemia    low iron  . BPH (benign  prostatic hyperplasia)   . CAD (coronary artery disease)   . Carotid artery stenosis    1-39% bilateral carotid artery stenosis and < 50% stenosis in the right CCA by dopplers 06/2017  . CKD (chronic kidney disease), stage III (HCC)    Stage 4  . Diastolic dysfunction   . Glaucoma   . Graves disease   . Heart murmur, systolic   . History of ETOH abuse   . History of PFTs 05/2010   moderate airflow obstruction w reduced DLCO by PFT   . Hyperlipemia   . Hypertension   . Hyperthyroidism 08/26/10   radioactive iodine therapy   . Mitral regurgitation echo 2015   mild  . Multiple thyroid nodules   . MVP (mitral valve prolapse) 11/2012   posterior MVP  . Organic impotence   . OSA (obstructive sleep apnea)    upper airway resistance syndrome with RDI 18/hr - not on CPAP due to insurance not covering  . Pre-diabetes   . PUD (peptic ulcer disease)   . Pulmonary hypertension ([place]) echo 2015   Group 2 with pulmonary venous HTN and Group 3 with OSA  . Shutdown renal    after cardiac cath  . Upper airway resistance syndrome     Past Surgical History:  Procedure Laterality Date  . APPENDECTOMY    . AV FISTULA PLACEMENT Left 10/18/2017   Procedure: ARTERIOVENOUS (AV) FISTULA CREATION ARM;  Surgeon: Waynetta Sandy, MD;  Location: [facility];  Service: Vascular;  Laterality: Left;  . CARDIAC CATHETERIZATION    . CARDIAC CATHETERIZATION N/A 02/17/2015   Procedure: Right Heart Cath;  Surgeon: Larey Dresser, MD;  Location: [facility];  Service: Cardiovascular;  Laterality: N/A;  . ESOPHAGOGASTRODUODENOSCOPY (EGD) WITH PROPOFOL Left 10/16/2016   Procedure: ESOPHAGOGASTRODUODENOSCOPY (EGD) WITH PROPOFOL;  Surgeon: Ronnette Juniper, MD;  Location: [facility];  Service: Gastroenterology;  Laterality: Left;  . heart catherization    . HERNIA REPAIR  10/2009  . IR RADIOLOGIST EVAL & MGMT  09/28/2016  . IR RADIOLOGIST EVAL & MGMT  10/19/2016  . IR RADIOLOGIST EVAL & MGMT  01/18/2017  . RIGHT  HEART CATH N/A 07/28/2016   Procedure: Right Heart Cath;  Surgeon: Larey Dresser, MD;  Location: [facility];  Service: Cardiovascular;  Laterality: N/A;    Family History  Problem Relation Age of Onset  . Kidney failure Father   . Hypertension Father   . Colon cancer Mother   . Colon cancer Brother   . Lung disease Brother   . Colon cancer Maternal Uncle     Social History:  reports that he quit smoking  about 33 years ago. His smoking use included cigarettes. He has a 80.00 pack-year smoking history. He has never used smokeless tobacco. He reports that he does not drink alcohol or use drugs.  Review of Systems  Constitutional: Positive for weight loss. Negative for reduced appetite.       Has lost a little weight recently  Eyes: Negative for blurred vision.  Respiratory: Negative for shortness of breath.   Cardiovascular: Positive for leg swelling.       Has leg swelling at times  Gastrointestinal: Negative for nausea.  Endocrine: Negative for fatigue.  Genitourinary: Negative for frequency.  Musculoskeletal: Negative for joint pain.  Skin: Negative for rash.  Neurological: Negative for weakness.       He is complaining of some memory difficulties     EXAM:  BP (!) 158/60   Pulse (!) 52   Ht 5\' 8"  (1.727 m)   Wt 135 lb (61.2 kg)   SpO2 97%   BMI 20.53 kg/m   GENERAL: Averagely built and nourished  No pallor, clubbing, lymphadenopathy or edema.  Skin:  no rash or pigmentation.  EYES: He has prominent eyes with bilateral proptosis without conjunctival erythema Fundii: Exam not indicated  ENT: Oral mucosa and tongue normal.  THYROID:  Not palpable.  HEART:  Normal  S1 and S2; no murmur or click.  CHEST:  Normal shape Lungs:   Vescicular breath sounds heard equally.  No crepitations/ wheeze.  ABDOMEN:  No distention.  Liver and spleen not palpable.  No other mass or tenderness.  NEUROLOGICAL: .Reflexes are normal bilaterally at biceps.  SPINE AND  JOINTS:  Normal peripheral joints  Assessment/Plan:   HYPERPARATHYROIDISM:  He has been referred for secondary hyperparathyroidism However currently his PTH is 199 which is appropriate for his GFR of 13 However he does appear to have only a high normal or mildly increased calcium indicating likely mild hyperparathyroidism which could be secondary or tertiary  Discussed the nature of hyperparathyroidism as well as normal role of the parathyroid glands.  Discussed potential  effects of hyperparathyroidism long-term on bone health  Explained that surgery would be indicated if he has significant osteoporosis from hyperparathyroidism  We will also need regular follow-up for calcium and PTH levels  OSTEOPOROSIS: This is likely considering his height loss and dorsal kyphosis and not clear if this may be related to hyperparathyroidism or unrelated causes We will need to do a bone density to confirm this We will also need to establish baseline vitamin D status  Consider using Prolia if he has significant osteoporosis  Elayne Snare 11/25/2017, 9:11 AM

## 2017-11-26 LAB — PTH, INTACT AND CALCIUM
CALCIUM: 9.8 mg/dL (ref 8.6–10.2)
PTH: 76 pg/mL — AB (ref 15–65)

## 2017-11-29 DIAGNOSIS — K4090 Unilateral inguinal hernia, without obstruction or gangrene, not specified as recurrent: Secondary | ICD-10-CM | POA: Diagnosis not present

## 2017-12-01 ENCOUNTER — Ambulatory Visit (HOSPITAL_COMMUNITY)
Admission: RE | Admit: 2017-12-01 | Discharge: 2017-12-01 | Disposition: A | Discharge status: Home / Self Care | Payer: Medicare Other | Source: Ambulatory Visit | Attending: Nephrology | Admitting: Nephrology

## 2017-12-01 VITALS — BP 176/56 | HR 41 | Temp 97.6°F | Resp 20

## 2017-12-01 DIAGNOSIS — I130 Hypertensive heart and chronic kidney disease with heart failure and stage 1 through stage 4 chronic kidney disease, or unspecified chronic kidney disease: Secondary | ICD-10-CM | POA: Diagnosis not present

## 2017-12-01 DIAGNOSIS — N183 Chronic kidney disease, stage 3 (moderate): Secondary | ICD-10-CM | POA: Insufficient documentation

## 2017-12-01 DIAGNOSIS — D631 Anemia in chronic kidney disease: Secondary | ICD-10-CM | POA: Insufficient documentation

## 2017-12-01 LAB — POCT HEMOGLOBIN-HEMACUE: HEMOGLOBIN: 11.1 g/dL — AB (ref 13.0–17.0)

## 2017-12-01 MED ORDER — EPOETIN ALFA 10000 UNIT/ML IJ SOLN
5000.0000 [IU] | INTRAMUSCULAR | Status: DC
  Administered 2017-12-01: 5000 [IU] via SUBCUTANEOUS

## 2017-12-01 MED ORDER — EPOETIN ALFA 10000 UNIT/ML IJ SOLN
INTRAMUSCULAR | Status: AC
  Filled 2017-12-01: qty 1

## 2017-12-02 ENCOUNTER — Other Ambulatory Visit: Payer: Self-pay

## 2017-12-02 ENCOUNTER — Ambulatory Visit (INDEPENDENT_AMBULATORY_CARE_PROVIDER_SITE_OTHER): Payer: Self-pay | Admitting: Physician Assistant

## 2017-12-02 ENCOUNTER — Ambulatory Visit (HOSPITAL_COMMUNITY)
Admission: RE | Admit: 2017-12-02 | Discharge: 2017-12-02 | Disposition: A | Discharge status: Home / Self Care | Payer: Medicare Other | Source: Ambulatory Visit | Attending: Vascular Surgery | Admitting: Vascular Surgery

## 2017-12-02 VITALS — BP 187/52 | HR 44 | Resp 20 | Ht 68.0 in | Wt 135.0 lb

## 2017-12-02 DIAGNOSIS — Z9889 Other specified postprocedural states: Secondary | ICD-10-CM | POA: Diagnosis not present

## 2017-12-02 DIAGNOSIS — N185 Chronic kidney disease, stage 5: Secondary | ICD-10-CM | POA: Diagnosis not present

## 2017-12-02 DIAGNOSIS — J449 Chronic obstructive pulmonary disease, unspecified: Secondary | ICD-10-CM | POA: Diagnosis not present

## 2017-12-02 DIAGNOSIS — Z48812 Encounter for surgical aftercare following surgery on the circulatory system: Secondary | ICD-10-CM | POA: Insufficient documentation

## 2017-12-02 DIAGNOSIS — I272 Pulmonary hypertension, unspecified: Secondary | ICD-10-CM | POA: Diagnosis not present

## 2017-12-02 DIAGNOSIS — I129 Hypertensive chronic kidney disease with stage 1 through stage 4 chronic kidney disease, or unspecified chronic kidney disease: Secondary | ICD-10-CM | POA: Diagnosis not present

## 2017-12-02 DIAGNOSIS — N184 Chronic kidney disease, stage 4 (severe): Secondary | ICD-10-CM

## 2017-12-02 DIAGNOSIS — I2510 Atherosclerotic heart disease of native coronary artery without angina pectoris: Secondary | ICD-10-CM | POA: Diagnosis not present

## 2017-12-02 DIAGNOSIS — N400 Enlarged prostate without lower urinary tract symptoms: Secondary | ICD-10-CM | POA: Diagnosis not present

## 2017-12-02 DIAGNOSIS — D631 Anemia in chronic kidney disease: Secondary | ICD-10-CM | POA: Diagnosis not present

## 2017-12-02 DIAGNOSIS — E0500 Thyrotoxicosis with diffuse goiter without thyrotoxic crisis or storm: Secondary | ICD-10-CM | POA: Diagnosis not present

## 2017-12-02 DIAGNOSIS — N2581 Secondary hyperparathyroidism of renal origin: Secondary | ICD-10-CM | POA: Diagnosis not present

## 2017-12-02 DIAGNOSIS — K219 Gastro-esophageal reflux disease without esophagitis: Secondary | ICD-10-CM | POA: Diagnosis not present

## 2017-12-02 DIAGNOSIS — I5030 Unspecified diastolic (congestive) heart failure: Secondary | ICD-10-CM | POA: Diagnosis not present

## 2017-12-02 NOTE — Progress Notes (Addendum)
POST OPERATIVE OFFICE NOTE    CC:  F/u for surgery  HPI:  This is a 84 y.o. male who is s/p Left brachiocephalic AV fistula creation.  He is here today for a f/u duplex of the fistula to check for maturity.  He denise pain, loss of sensation and loss of motor. He denise any change in her medical history.  Allergies  Allergen Reactions  . Iodinated Diagnostic Agents Other (See Comments)    Right heart cath 07/2016 allegedly "shut down his kidneys" Patient has stage III chronic kidney disease  . Zocor [Simvastatin] Other (See Comments)    Liver problems   . Budesonide-Formoterol Fumarate Other (See Comments)    Dry mouth  . Minoxidil Other (See Comments)  . Tiotropium Bromide Monohydrate Other (See Comments)    Dry mouth  . Tricor [Fenofibrate]          Current Outpatient Medications  Medication Sig Dispense Refill  . aspirin EC 81 MG tablet Take 81 mg by mouth daily.    Marland Kitchen atorvastatin (LIPITOR) 20 MG tablet TAKE 1 TABLET BY MOUTH EVERY DAY 90 tablet 2  . Brinzolamide-Brimonidine (SIMBRINZA) 1-0.2 % SUSP Place 1 drop into both eyes 2 (two) times daily.     Marland Kitchen doxazosin (CARDURA) 2 MG tablet Take 1 tablet (2 mg total) daily by mouth. 90 tablet 3  . feeding supplement, ENSURE ENLIVE, (ENSURE ENLIVE) LIQD Take 237 mLs by mouth 2 (two) times daily between meals. 237 mL 12  . ferrous sulfate 325 (65 FE) MG tablet Take 325 mg by mouth daily with breakfast.    . furosemide (LASIX) 40 MG tablet Take 1 tablet (40 mg total) by mouth every other day. 20 tablet 3  . hydrALAZINE (APRESOLINE) 100 MG tablet Take 1 tablet (100 mg total) by mouth 3 (three) times daily. Please make yearly appt with Dr. Radford Pax for October. 1st attempt 90 tablet 1  . levothyroxine (SYNTHROID, LEVOTHROID) 137 MCG tablet Take 137 mcg by mouth daily before breakfast.     . Multiple Vitamin (MULTIVITAMIN WITH MINERALS) TABS tablet Take 1 tablet by mouth daily.    Marland Kitchen oxyCODONE-acetaminophen (PERCOCET) 5-325 MG tablet Take  1 tablet by mouth every 6 (six) hours as needed for severe pain. 8 tablet 0  . potassium chloride (KLOR-CON M10) 10 MEQ tablet Take 1 tablet (10 mEq total) by mouth daily. 90 tablet 2  . QUEtiapine (SEROQUEL) 25 MG tablet Take 1 tablet (25 mg total) by mouth at bedtime. 15 tablet 0  . amLODipine (NORVASC) 10 MG tablet Take 1 tablet (10 mg total) by mouth daily. 30 tablet 0   No current facility-administered medications for this visit.      ROS:  See HPI  Physical Exam:  Vitals:   12/02/17 1339  BP: (!) 187/52  Pulse: (!) 44  Resp: 20  SpO2: 98%    Incision:  Incision is well ehaled Extremities:  Prominent left BC fistula with palpable thrill, palpable radial pulse, sensation and grip intact and equal B UE Heart : RRR with known murmur Abdomen:  + BS soft NTTP  Fistula duplex  The fistula is > 06. Cm and the depth is < 0.2cm  Assessment/Plan:  This is a 84 y.o. male who is s/p:Left BC av fistula creation 10/18/2017 He is not yet on HD.  The fistula will be ready to use as of 01/18/2018.Marland Kitchen  He will f/u PRN in the future.   Roxy Horseman , PA-C [facility]  [phone]   

## 2017-12-08 ENCOUNTER — Ambulatory Visit (HOSPITAL_COMMUNITY)
Admission: RE | Admit: 2017-12-08 | Discharge: 2017-12-08 | Disposition: A | Discharge status: Home / Self Care | Payer: Medicare Other | Source: Ambulatory Visit | Attending: Nephrology | Admitting: Nephrology

## 2017-12-08 VITALS — BP 158/58 | HR 45 | Temp 98.0°F | Resp 20

## 2017-12-08 DIAGNOSIS — D631 Anemia in chronic kidney disease: Secondary | ICD-10-CM | POA: Insufficient documentation

## 2017-12-08 DIAGNOSIS — N183 Chronic kidney disease, stage 3 (moderate): Secondary | ICD-10-CM | POA: Insufficient documentation

## 2017-12-08 DIAGNOSIS — I130 Hypertensive heart and chronic kidney disease with heart failure and stage 1 through stage 4 chronic kidney disease, or unspecified chronic kidney disease: Secondary | ICD-10-CM

## 2017-12-08 LAB — IRON AND TIBC
IRON: 36 ug/dL — AB (ref 45–182)
Saturation Ratios: 14 % — ABNORMAL LOW (ref 17.9–39.5)
TIBC: 258 ug/dL (ref 250–450)
UIBC: 222 ug/dL

## 2017-12-08 LAB — FERRITIN: FERRITIN: 127 ng/mL (ref 24–336)

## 2017-12-08 LAB — POCT HEMOGLOBIN-HEMACUE: Hemoglobin: 11.4 g/dL — ABNORMAL LOW (ref 13.0–17.0)

## 2017-12-08 MED ORDER — EPOETIN ALFA 10000 UNIT/ML IJ SOLN
5000.0000 [IU] | INTRAMUSCULAR | Status: DC
  Administered 2017-12-08: 5000 [IU] via SUBCUTANEOUS

## 2017-12-08 MED ORDER — EPOETIN ALFA 10000 UNIT/ML IJ SOLN
INTRAMUSCULAR | Status: AC
  Administered 2017-12-08: 5000 [IU] via SUBCUTANEOUS
  Filled 2017-12-08: qty 1

## 2017-12-15 ENCOUNTER — Ambulatory Visit (HOSPITAL_COMMUNITY)
Admission: RE | Admit: 2017-12-15 | Discharge: 2017-12-15 | Disposition: A | Discharge status: Home / Self Care | Payer: Medicare Other | Source: Ambulatory Visit | Attending: Nephrology | Admitting: Nephrology

## 2017-12-15 VITALS — BP 167/54 | HR 40 | Temp 97.4°F | Resp 20

## 2017-12-15 DIAGNOSIS — D631 Anemia in chronic kidney disease: Secondary | ICD-10-CM | POA: Insufficient documentation

## 2017-12-15 DIAGNOSIS — N183 Chronic kidney disease, stage 3 (moderate): Secondary | ICD-10-CM | POA: Insufficient documentation

## 2017-12-15 DIAGNOSIS — I130 Hypertensive heart and chronic kidney disease with heart failure and stage 1 through stage 4 chronic kidney disease, or unspecified chronic kidney disease: Secondary | ICD-10-CM

## 2017-12-15 LAB — POCT HEMOGLOBIN-HEMACUE: Hemoglobin: 12 g/dL — ABNORMAL LOW (ref 13.0–17.0)

## 2017-12-15 MED ORDER — EPOETIN ALFA 10000 UNIT/ML IJ SOLN: 5000.0000 [IU] | INTRAMUSCULAR | Status: DC

## 2017-12-22 ENCOUNTER — Encounter (HOSPITAL_COMMUNITY): Payer: Medicare Other

## 2017-12-29 ENCOUNTER — Encounter (HOSPITAL_COMMUNITY)
Admission: RE | Admit: 2017-12-29 | Discharge: 2017-12-29 | Disposition: A | Discharge status: Home / Self Care | Payer: Medicare Other | Source: Ambulatory Visit | Attending: Nephrology | Admitting: Nephrology

## 2017-12-29 VITALS — BP 142/51 | HR 40 | Temp 97.5°F | Resp 20

## 2017-12-29 DIAGNOSIS — D631 Anemia in chronic kidney disease: Secondary | ICD-10-CM | POA: Diagnosis not present

## 2017-12-29 DIAGNOSIS — N183 Chronic kidney disease, stage 3 (moderate): Secondary | ICD-10-CM | POA: Diagnosis not present

## 2017-12-29 DIAGNOSIS — I130 Hypertensive heart and chronic kidney disease with heart failure and stage 1 through stage 4 chronic kidney disease, or unspecified chronic kidney disease: Secondary | ICD-10-CM

## 2017-12-29 LAB — POCT HEMOGLOBIN-HEMACUE: Hemoglobin: 11.3 g/dL — ABNORMAL LOW (ref 13.0–17.0)

## 2017-12-29 MED ORDER — EPOETIN ALFA 10000 UNIT/ML IJ SOLN: 5000.0000 [IU] | INTRAMUSCULAR | Status: DC

## 2017-12-29 MED ORDER — EPOETIN ALFA 2000 UNIT/ML IJ SOLN
INTRAMUSCULAR | Status: AC
  Administered 2017-12-29: 2000 [IU] via SUBCUTANEOUS
  Filled 2017-12-29: qty 1

## 2017-12-29 MED ORDER — EPOETIN ALFA 3000 UNIT/ML IJ SOLN
INTRAMUSCULAR | Status: AC
  Administered 2017-12-29: 3000 [IU] via SUBCUTANEOUS
  Filled 2017-12-29: qty 1

## 2018-01-03 DIAGNOSIS — D631 Anemia in chronic kidney disease: Secondary | ICD-10-CM | POA: Diagnosis not present

## 2018-01-03 DIAGNOSIS — I272 Pulmonary hypertension, unspecified: Secondary | ICD-10-CM | POA: Diagnosis not present

## 2018-01-03 DIAGNOSIS — J449 Chronic obstructive pulmonary disease, unspecified: Secondary | ICD-10-CM | POA: Diagnosis not present

## 2018-01-03 DIAGNOSIS — I2510 Atherosclerotic heart disease of native coronary artery without angina pectoris: Secondary | ICD-10-CM | POA: Diagnosis not present

## 2018-01-03 DIAGNOSIS — N400 Enlarged prostate without lower urinary tract symptoms: Secondary | ICD-10-CM | POA: Diagnosis not present

## 2018-01-03 DIAGNOSIS — Z8719 Personal history of other diseases of the digestive system: Secondary | ICD-10-CM | POA: Diagnosis not present

## 2018-01-03 DIAGNOSIS — K4090 Unilateral inguinal hernia, without obstruction or gangrene, not specified as recurrent: Secondary | ICD-10-CM | POA: Diagnosis not present

## 2018-01-03 DIAGNOSIS — I5030 Unspecified diastolic (congestive) heart failure: Secondary | ICD-10-CM | POA: Diagnosis not present

## 2018-01-03 DIAGNOSIS — N189 Chronic kidney disease, unspecified: Secondary | ICD-10-CM | POA: Diagnosis not present

## 2018-01-03 DIAGNOSIS — I129 Hypertensive chronic kidney disease with stage 1 through stage 4 chronic kidney disease, or unspecified chronic kidney disease: Secondary | ICD-10-CM | POA: Diagnosis not present

## 2018-01-03 DIAGNOSIS — N2581 Secondary hyperparathyroidism of renal origin: Secondary | ICD-10-CM | POA: Diagnosis not present

## 2018-01-03 DIAGNOSIS — N184 Chronic kidney disease, stage 4 (severe): Secondary | ICD-10-CM | POA: Diagnosis not present

## 2018-01-03 DIAGNOSIS — K219 Gastro-esophageal reflux disease without esophagitis: Secondary | ICD-10-CM | POA: Diagnosis not present

## 2018-01-05 ENCOUNTER — Ambulatory Visit (HOSPITAL_COMMUNITY)
Admission: RE | Admit: 2018-01-05 | Discharge: 2018-01-05 | Disposition: A | Discharge status: Home / Self Care | Payer: Medicare Other | Source: Ambulatory Visit | Attending: Nephrology | Admitting: Nephrology

## 2018-01-05 VITALS — BP 179/47 | HR 42 | Temp 97.7°F | Resp 20

## 2018-01-05 DIAGNOSIS — D631 Anemia in chronic kidney disease: Secondary | ICD-10-CM | POA: Diagnosis not present

## 2018-01-05 DIAGNOSIS — N183 Chronic kidney disease, stage 3 (moderate): Secondary | ICD-10-CM | POA: Insufficient documentation

## 2018-01-05 DIAGNOSIS — I130 Hypertensive heart and chronic kidney disease with heart failure and stage 1 through stage 4 chronic kidney disease, or unspecified chronic kidney disease: Secondary | ICD-10-CM

## 2018-01-05 LAB — FERRITIN: Ferritin: 167 ng/mL (ref 24–336)

## 2018-01-05 LAB — IRON AND TIBC
Iron: 35 ug/dL — ABNORMAL LOW (ref 45–182)
SATURATION RATIOS: 14 % — AB (ref 17.9–39.5)
TIBC: 245 ug/dL — ABNORMAL LOW (ref 250–450)
UIBC: 210 ug/dL

## 2018-01-05 LAB — POCT HEMOGLOBIN-HEMACUE: Hemoglobin: 10.7 g/dL — ABNORMAL LOW (ref 13.0–17.0)

## 2018-01-05 MED ORDER — EPOETIN ALFA 2000 UNIT/ML IJ SOLN
INTRAMUSCULAR | Status: AC
  Administered 2018-01-05: 2000 [IU]
  Filled 2018-01-05: qty 1

## 2018-01-05 MED ORDER — EPOETIN ALFA 3000 UNIT/ML IJ SOLN
INTRAMUSCULAR | Status: AC
  Administered 2018-01-05: 3000 [IU]
  Filled 2018-01-05: qty 1

## 2018-01-05 MED ORDER — EPOETIN ALFA 10000 UNIT/ML IJ SOLN: 5000.0000 [IU] | INTRAMUSCULAR | Status: DC

## 2018-01-12 ENCOUNTER — Encounter (HOSPITAL_COMMUNITY)
Admission: RE | Admit: 2018-01-12 | Discharge: 2018-01-12 | Disposition: A | Discharge status: Home / Self Care | Payer: Medicare Other | Source: Ambulatory Visit | Attending: Nephrology | Admitting: Nephrology

## 2018-01-12 VITALS — BP 160/52 | HR 40 | Temp 97.4°F | Resp 20

## 2018-01-12 DIAGNOSIS — D631 Anemia in chronic kidney disease: Secondary | ICD-10-CM | POA: Diagnosis not present

## 2018-01-12 DIAGNOSIS — N183 Chronic kidney disease, stage 3 (moderate): Secondary | ICD-10-CM | POA: Diagnosis not present

## 2018-01-12 DIAGNOSIS — I130 Hypertensive heart and chronic kidney disease with heart failure and stage 1 through stage 4 chronic kidney disease, or unspecified chronic kidney disease: Secondary | ICD-10-CM

## 2018-01-12 LAB — POCT HEMOGLOBIN-HEMACUE: Hemoglobin: 10.6 g/dL — ABNORMAL LOW (ref 13.0–17.0)

## 2018-01-12 MED ORDER — EPOETIN ALFA 3000 UNIT/ML IJ SOLN
INTRAMUSCULAR | Status: AC
  Administered 2018-01-12: 3000 [IU] via SUBCUTANEOUS
  Filled 2018-01-12: qty 1

## 2018-01-12 MED ORDER — EPOETIN ALFA 2000 UNIT/ML IJ SOLN
INTRAMUSCULAR | Status: AC
  Administered 2018-01-12: 2000 [IU] via SUBCUTANEOUS
  Filled 2018-01-12: qty 1

## 2018-01-12 MED ORDER — EPOETIN ALFA 10000 UNIT/ML IJ SOLN: 5000.0000 [IU] | INTRAMUSCULAR | Status: DC

## 2018-01-18 DIAGNOSIS — N184 Chronic kidney disease, stage 4 (severe): Secondary | ICD-10-CM | POA: Diagnosis not present

## 2018-01-19 ENCOUNTER — Ambulatory Visit (HOSPITAL_COMMUNITY)
Admission: RE | Admit: 2018-01-19 | Discharge: 2018-01-19 | Disposition: A | Discharge status: Home / Self Care | Payer: Medicare Other | Source: Ambulatory Visit | Attending: Nephrology | Admitting: Nephrology

## 2018-01-19 VITALS — BP 169/50 | HR 43 | Temp 97.7°F | Resp 20

## 2018-01-19 DIAGNOSIS — Z5181 Encounter for therapeutic drug level monitoring: Secondary | ICD-10-CM | POA: Insufficient documentation

## 2018-01-19 DIAGNOSIS — D631 Anemia in chronic kidney disease: Secondary | ICD-10-CM | POA: Insufficient documentation

## 2018-01-19 DIAGNOSIS — Z79899 Other long term (current) drug therapy: Secondary | ICD-10-CM | POA: Diagnosis not present

## 2018-01-19 DIAGNOSIS — N183 Chronic kidney disease, stage 3 (moderate): Secondary | ICD-10-CM | POA: Insufficient documentation

## 2018-01-19 DIAGNOSIS — I130 Hypertensive heart and chronic kidney disease with heart failure and stage 1 through stage 4 chronic kidney disease, or unspecified chronic kidney disease: Secondary | ICD-10-CM

## 2018-01-19 LAB — POCT HEMOGLOBIN-HEMACUE: HEMOGLOBIN: 10.6 g/dL — AB (ref 13.0–17.0)

## 2018-01-19 MED ORDER — EPOETIN ALFA 10000 UNIT/ML IJ SOLN: 5000.0000 [IU] | INTRAMUSCULAR | Status: DC

## 2018-01-19 MED ORDER — EPOETIN ALFA 2000 UNIT/ML IJ SOLN
INTRAMUSCULAR | Status: AC
  Administered 2018-01-19: 2000 [IU] via SUBCUTANEOUS
  Filled 2018-01-19: qty 1

## 2018-01-19 MED ORDER — EPOETIN ALFA 3000 UNIT/ML IJ SOLN
INTRAMUSCULAR | Status: AC
  Administered 2018-01-19: 3000 [IU] via SUBCUTANEOUS
  Filled 2018-01-19: qty 1

## 2018-01-20 DIAGNOSIS — H401113 Primary open-angle glaucoma, right eye, severe stage: Secondary | ICD-10-CM | POA: Diagnosis not present

## 2018-01-20 DIAGNOSIS — H401121 Primary open-angle glaucoma, left eye, mild stage: Secondary | ICD-10-CM | POA: Diagnosis not present

## 2018-01-26 ENCOUNTER — Ambulatory Visit (HOSPITAL_COMMUNITY)
Admission: RE | Admit: 2018-01-26 | Discharge: 2018-01-26 | Disposition: A | Discharge status: Home / Self Care | Payer: Medicare Other | Source: Ambulatory Visit | Attending: Nephrology | Admitting: Nephrology

## 2018-01-26 ENCOUNTER — Other Ambulatory Visit: Payer: Self-pay | Admitting: Cardiology

## 2018-01-26 VITALS — BP 139/52 | HR 40 | Temp 97.7°F | Resp 20

## 2018-01-26 DIAGNOSIS — N183 Chronic kidney disease, stage 3 (moderate): Secondary | ICD-10-CM | POA: Insufficient documentation

## 2018-01-26 DIAGNOSIS — I130 Hypertensive heart and chronic kidney disease with heart failure and stage 1 through stage 4 chronic kidney disease, or unspecified chronic kidney disease: Secondary | ICD-10-CM | POA: Diagnosis not present

## 2018-01-26 DIAGNOSIS — I5032 Chronic diastolic (congestive) heart failure: Secondary | ICD-10-CM | POA: Diagnosis not present

## 2018-01-26 LAB — POCT HEMOGLOBIN-HEMACUE: HEMOGLOBIN: 10.6 g/dL — AB (ref 13.0–17.0)

## 2018-01-26 MED ORDER — EPOETIN ALFA 2000 UNIT/ML IJ SOLN
INTRAMUSCULAR | Status: AC
  Administered 2018-01-26: 2000 [IU] via SUBCUTANEOUS
  Filled 2018-01-26: qty 1

## 2018-01-26 MED ORDER — EPOETIN ALFA 10000 UNIT/ML IJ SOLN: 5000.0000 [IU] | INTRAMUSCULAR | Status: DC

## 2018-01-26 MED ORDER — EPOETIN ALFA 3000 UNIT/ML IJ SOLN
INTRAMUSCULAR | Status: AC
  Administered 2018-01-26: 3000 [IU] via SUBCUTANEOUS
  Filled 2018-01-26: qty 1

## 2018-02-02 ENCOUNTER — Ambulatory Visit (HOSPITAL_COMMUNITY)
Admission: RE | Admit: 2018-02-02 | Discharge: 2018-02-02 | Disposition: A | Discharge status: Home / Self Care | Payer: Medicare Other | Source: Ambulatory Visit | Attending: Nephrology | Admitting: Nephrology

## 2018-02-02 VITALS — BP 171/58 | HR 43 | Temp 97.7°F | Resp 20

## 2018-02-02 DIAGNOSIS — N189 Chronic kidney disease, unspecified: Secondary | ICD-10-CM | POA: Diagnosis not present

## 2018-02-02 DIAGNOSIS — I509 Heart failure, unspecified: Secondary | ICD-10-CM | POA: Diagnosis not present

## 2018-02-02 DIAGNOSIS — I130 Hypertensive heart and chronic kidney disease with heart failure and stage 1 through stage 4 chronic kidney disease, or unspecified chronic kidney disease: Secondary | ICD-10-CM | POA: Diagnosis not present

## 2018-02-02 LAB — IRON AND TIBC
Iron: 36 ug/dL — ABNORMAL LOW (ref 45–182)
Saturation Ratios: 15 % — ABNORMAL LOW (ref 17.9–39.5)
TIBC: 248 ug/dL — AB (ref 250–450)
UIBC: 212 ug/dL

## 2018-02-02 LAB — POCT HEMOGLOBIN-HEMACUE: HEMOGLOBIN: 10.5 g/dL — AB (ref 13.0–17.0)

## 2018-02-02 LAB — FERRITIN: FERRITIN: 154 ng/mL (ref 24–336)

## 2018-02-02 MED ORDER — EPOETIN ALFA 10000 UNIT/ML IJ SOLN: 5000.0000 [IU] | INTRAMUSCULAR | Status: DC

## 2018-02-02 MED ORDER — EPOETIN ALFA 3000 UNIT/ML IJ SOLN
INTRAMUSCULAR | Status: AC
  Administered 2018-02-02: 3000 [IU] via SUBCUTANEOUS
  Filled 2018-02-02: qty 1

## 2018-02-02 MED ORDER — EPOETIN ALFA 2000 UNIT/ML IJ SOLN
INTRAMUSCULAR | Status: AC
  Administered 2018-02-02: 2000 [IU] via SUBCUTANEOUS
  Filled 2018-02-02: qty 1

## 2018-02-06 ENCOUNTER — Ambulatory Visit: Payer: Self-pay | Admitting: Surgery

## 2018-02-06 DIAGNOSIS — K4090 Unilateral inguinal hernia, without obstruction or gangrene, not specified as recurrent: Secondary | ICD-10-CM | POA: Diagnosis not present

## 2018-02-06 NOTE — H&P (Signed)
History of Present Illness Imogene Burn. Alenna Russell MD; 02/06/2018 12:00 PM) The patient is a 84 year old male who presents with an inguinal hernia. Renal - Dr. [name] Cardiology - Dr. Fransico Him PCP - Dr. [name]  Reason for eval - right inguinal hernia  This is an 84 year old male with multiple medical issues who is status post left inguinal hernia repair in 2011 by Dr. Rise Patience who presents with a bulge in his right groin. This has been present for the last couple of months. It has become larger. He remains reducible. He has some discomfort associated with this. No significant pain. No change in his bowel movements. Appetite is good.  The patient had a fistula placed in his left upper extremity in preparation for beginning dialysis soon. He has coronary artery disease and is followed by Dr. Radford Pax. His only blood thinner currently is aspirin.   Past Surgical History Emeline Gins, CMA; 02/06/2018 10:15 AM) Appendectomy Cataract Surgery Bilateral. Laparoscopic Inguinal Hernia Surgery Right.  Diagnostic Studies History Emeline Gins, [place]; 02/06/2018 10:15 AM) Colonoscopy 1-5 years ago  Allergies Emeline Gins, CMA; 02/06/2018 10:18 AM) No Known Drug Allergies [02/06/2018]: Allergies Reconciled  Medication History Emeline Gins, CMA; 02/06/2018 10:18 AM) amLODIPine Besylate (10MG  Tablet, Oral) Active. Doxazosin Mesylate (2MG  Tablet, Oral) Active. hydrALAZINE HCl (100MG  Tablet, Oral) Active. Atorvastatin Calcium (20MG  Tablet, Oral) Active. Doxazosin Mesylate (4MG  Tablet, Oral) Active. Furosemide (40MG  Tablet, Oral) Active. Levothyroxine Sodium (150MCG Tablet, Oral) Active. Losartan Potassium (50MG  Tablet, Oral) Active. Potassium Chloride ER (10MEQ Tablet ER, Oral) Active. Simbrinza (1-0.2% Suspension, Ophthalmic) Active. Medications Reconciled  Social History Emeline Gins, [place]; 02/06/2018 10:15 AM) Alcohol use Remotely quit  alcohol use. Caffeine use Coffee. No drug use Tobacco use Former smoker.  Family History Emeline Gins, [place]; 02/06/2018 10:15 AM) Colon Cancer Mother. Colon Polyps Sister. Hypertension Sister. Kidney Disease Sister. Rectal Cancer Sister.  Other Problems Emeline Gins, CMA; 02/06/2018 10:15 AM) Bladder Problems Chronic Renal Failure Syndrome Enlarged Prostate Gastric Ulcer Heart murmur High blood pressure Inguinal Hernia Thyroid Disease     Review of Systems Emeline Gins CMA; 02/06/2018 10:15 AM) General Not Present- Appetite Loss, Chills, Fatigue, Fever, Night Sweats, Weight Gain and Weight Loss. Skin Not Present- Change in Wart/Mole, Dryness, Hives, Jaundice, New Lesions, Non-Healing Wounds, Rash and Ulcer. HEENT Present- Wears glasses/contact lenses. Not Present- Earache, Hearing Loss, Hoarseness, Nose Bleed, Oral Ulcers, Ringing in the Ears, Seasonal Allergies, Sinus Pain, Sore Throat, Visual Disturbances and Yellow Eyes. Respiratory Not Present- Bloody sputum, Chronic Cough, Difficulty Breathing, Snoring and Wheezing. Breast Not Present- Breast Mass, Breast Pain, Nipple Discharge and Skin Changes. Cardiovascular Not Present- Chest Pain, Difficulty Breathing Lying Down, Leg Cramps, Palpitations, Rapid Heart Rate, Shortness of Breath and Swelling of Extremities. Gastrointestinal Present- Excessive gas. Not Present- Abdominal Pain, Bloating, Bloody Stool, Change in Bowel Habits, Chronic diarrhea, Constipation, Difficulty Swallowing, Gets full quickly at meals, Hemorrhoids, Indigestion, Nausea, Rectal Pain and Vomiting. Male Genitourinary Present- Frequency, Nocturia, Urgency and Urine Leakage. Not Present- Blood in Urine, Change in Urinary Stream, Impotence and Painful Urination. Musculoskeletal Not Present- Back Pain, Joint Pain, Joint Stiffness, Muscle Pain, Muscle Weakness and Swelling of Extremities. Neurological Present- Decreased Memory. Not  Present- Fainting, Headaches, Numbness, Seizures, Tingling, Tremor, Trouble walking and Weakness. Hematology Not Present- Blood Thinners, Easy Bruising, Excessive bleeding, Gland problems, HIV and Persistent Infections.  Vitals Emeline Gins CMA; 02/06/2018 10:17 AM) 02/06/2018 10:17 AM Weight: 144.38 lb Height: 68in Body Surface Area: 1.78 m Body Mass Index: 21.95 kg/m  Temp.:  98.19F  Pulse: 43 (Regular)  BP: 178/56 (Sitting, Left Arm, Standard)      Physical Exam Rodman Key K. Cleavon Goldman MD; 02/06/2018 12:01 PM)  The physical exam findings are as follows: Note:WDWN in NAD Eyes: Pupils equal, round; sclera anicteric HENT: Oral mucosa moist; good dentition Neck: No masses palpated, no thyromegaly Lungs: CTA bilaterally; normal respiratory effort CV: Regular rate and rhythm; no murmurs; extremities well-perfused with no edema Abd: +bowel sounds, soft, non-tender, no palpable organomegaly; GU; bilateral descended testes, no testicular masses, healed left inguinal incision with no sign of recurrent left inguinal hernia Large right inguinal hernia that is reducible. It enlarges with Valsalva maneuver. Skin: Warm, dry; no sign of jaundice Psychiatric - alert and oriented x 4; calm mood and affect    Assessment & Plan Rodman Key K. Bertie Simien MD; 02/06/2018 12:01 PM)  INGUINAL HERNIA OF RIGHT SIDE WITHOUT OBSTRUCTION OR GANGRENE (K40.90)  Current Plans Schedule for Surgery - Right inguinal hernia repair with mesh. The surgical procedure has been discussed with the patient. Potential risks, benefits, alternative treatments, and expected outcomes have been explained. All of the patient's questions at this time have been answered. The likelihood of reaching the patient's treatment goal is good. The patient understand the proposed surgical procedure and wishes to proceed. Pt Education - Pamphlet Given - Hernia Surgery: discussed with patient and provided information. Note:Due to  the patient's comorbidities, we will perform surgery at [facility] and we'll plan on keeping him overnight. We will first await cardiac clearance from Dr. Radford Pax. He will need to hold his aspirin for 5 days prior to surgery.  Imogene Burn. Georgette Dover, MD, [facility]  General/ Trauma Surgery Beeper [phone]  02/06/2018 12:02 PM

## 2018-02-07 ENCOUNTER — Telehealth: Payer: Self-pay | Admitting: *Deleted

## 2018-02-07 NOTE — Telephone Encounter (Signed)
   [facility] Pre-operative Risk Assessment    Request for surgical clearance:  1. What type of surgery is being performed? RIGHT INGUINAL HERNIA REPAIR WITH MESH   2. When is this surgery scheduled? TBD    3. Are there any medications that need to be held prior to surgery and how long? ASA   4. Practice name and name of physician performing surgery? [facility]; DR. Georgette Dover   5. What is your office phone and fax number? PH# [phone]; FAX# [phone]   7. Anesthesia type (None, local, MAC, general) ? GENERAL    Jeffrey Campbell 02/07/2018, 9:16 AM  _________________________________________________________________   (provider comments below)

## 2018-02-08 NOTE — Telephone Encounter (Signed)
S/w pt to schedule pt's appt for surgical clearance.

## 2018-02-08 NOTE — Telephone Encounter (Signed)
   Primary Cardiologist:Traci Turner, MD  Chart reviewed as part of pre-operative protocol coverage. Because of Jeffrey Campbell's past medical history and time since last visit, he/she will require a follow-up visit in order to better assess preoperative cardiovascular risk. Patient has upcoming OV in December.   Pre-op covering staff: - Please schedule appointment and call patient to inform them. - Please contact requesting surgeon's office via preferred method (i.e, phone, fax) to inform them of need for appointment prior to surgery.  If applicable, this message will also be routed to primary cardiologist for input on holding anticoagulant/antiplatelet agent as requested below so that this information is available at time of patient's appointment.   Dr. Radford Pax, please advise if ok to hold aspirin and if so, how long. Please send your response back to  CV DIV PREOP  Truitt Merle, NP  02/08/2018, 12:46 PM

## 2018-02-09 ENCOUNTER — Ambulatory Visit (HOSPITAL_COMMUNITY)
Admission: RE | Admit: 2018-02-09 | Discharge: 2018-02-09 | Disposition: A | Discharge status: Home / Self Care | Payer: Medicare Other | Source: Ambulatory Visit | Attending: Nephrology | Admitting: Nephrology

## 2018-02-09 VITALS — BP 179/56 | HR 41 | Temp 98.6°F | Resp 20

## 2018-02-09 DIAGNOSIS — I130 Hypertensive heart and chronic kidney disease with heart failure and stage 1 through stage 4 chronic kidney disease, or unspecified chronic kidney disease: Secondary | ICD-10-CM | POA: Insufficient documentation

## 2018-02-09 LAB — POCT HEMOGLOBIN-HEMACUE: HEMOGLOBIN: 10.5 g/dL — AB (ref 13.0–17.0)

## 2018-02-09 MED ORDER — EPOETIN ALFA 10000 UNIT/ML IJ SOLN: 5000.0000 [IU] | INTRAMUSCULAR | Status: DC

## 2018-02-09 MED ORDER — EPOETIN ALFA 3000 UNIT/ML IJ SOLN
INTRAMUSCULAR | Status: AC
  Administered 2018-02-09: 3000 [IU]
  Filled 2018-02-09: qty 1

## 2018-02-09 MED ORDER — CLONIDINE HCL 0.1 MG PO TABS
ORAL_TABLET | ORAL | Status: AC
  Filled 2018-02-09: qty 1

## 2018-02-09 MED ORDER — CLONIDINE HCL 0.1 MG PO TABS
0.1000 mg | ORAL_TABLET | Freq: Once | ORAL | Status: AC | PRN
  Administered 2018-02-09: 0.1 mg via ORAL

## 2018-02-09 MED ORDER — EPOETIN ALFA 2000 UNIT/ML IJ SOLN
INTRAMUSCULAR | Status: AC
  Administered 2018-02-09: 2000 [IU]
  Filled 2018-02-09: qty 1

## 2018-02-09 NOTE — Telephone Encounter (Signed)
Ok to hold ASA for surgery

## 2018-02-10 DIAGNOSIS — H348312 Tributary (branch) retinal vein occlusion, right eye, stable: Secondary | ICD-10-CM | POA: Diagnosis not present

## 2018-02-10 DIAGNOSIS — H3411 Central retinal artery occlusion, right eye: Secondary | ICD-10-CM | POA: Diagnosis not present

## 2018-02-10 DIAGNOSIS — H401133 Primary open-angle glaucoma, bilateral, severe stage: Secondary | ICD-10-CM | POA: Diagnosis not present

## 2018-02-10 DIAGNOSIS — H43812 Vitreous degeneration, left eye: Secondary | ICD-10-CM | POA: Diagnosis not present

## 2018-02-16 ENCOUNTER — Ambulatory Visit (HOSPITAL_COMMUNITY)
Admission: RE | Admit: 2018-02-16 | Discharge: 2018-02-16 | Disposition: A | Discharge status: Home / Self Care | Payer: Medicare Other | Source: Ambulatory Visit | Attending: Nephrology | Admitting: Nephrology

## 2018-02-16 VITALS — BP 153/54 | HR 42 | Temp 98.2°F | Resp 16

## 2018-02-16 DIAGNOSIS — I130 Hypertensive heart and chronic kidney disease with heart failure and stage 1 through stage 4 chronic kidney disease, or unspecified chronic kidney disease: Secondary | ICD-10-CM | POA: Diagnosis not present

## 2018-02-16 MED ORDER — EPOETIN ALFA 10000 UNIT/ML IJ SOLN: 5000.0000 [IU] | INTRAMUSCULAR | Status: DC

## 2018-02-16 MED ORDER — EPOETIN ALFA 2000 UNIT/ML IJ SOLN
INTRAMUSCULAR | Status: AC
  Administered 2018-02-16: 2000 [IU] via SUBCUTANEOUS
  Filled 2018-02-16: qty 1

## 2018-02-16 MED ORDER — EPOETIN ALFA 3000 UNIT/ML IJ SOLN
INTRAMUSCULAR | Status: AC
  Administered 2018-02-16: 3000 [IU] via SUBCUTANEOUS
  Filled 2018-02-16: qty 1

## 2018-02-17 ENCOUNTER — Encounter: Payer: Self-pay | Admitting: Cardiology

## 2018-02-17 ENCOUNTER — Ambulatory Visit (INDEPENDENT_AMBULATORY_CARE_PROVIDER_SITE_OTHER): Payer: Medicare Other | Admitting: Cardiology

## 2018-02-17 VITALS — BP 184/72 | HR 43 | Ht 68.0 in | Wt 141.0 lb

## 2018-02-17 DIAGNOSIS — Z01818 Encounter for other preprocedural examination: Secondary | ICD-10-CM

## 2018-02-17 DIAGNOSIS — N189 Chronic kidney disease, unspecified: Secondary | ICD-10-CM | POA: Diagnosis not present

## 2018-02-17 DIAGNOSIS — I2510 Atherosclerotic heart disease of native coronary artery without angina pectoris: Secondary | ICD-10-CM

## 2018-02-17 DIAGNOSIS — J449 Chronic obstructive pulmonary disease, unspecified: Secondary | ICD-10-CM | POA: Diagnosis not present

## 2018-02-17 DIAGNOSIS — I6523 Occlusion and stenosis of bilateral carotid arteries: Secondary | ICD-10-CM

## 2018-02-17 DIAGNOSIS — Z8719 Personal history of other diseases of the digestive system: Secondary | ICD-10-CM | POA: Diagnosis not present

## 2018-02-17 DIAGNOSIS — I129 Hypertensive chronic kidney disease with stage 1 through stage 4 chronic kidney disease, or unspecified chronic kidney disease: Secondary | ICD-10-CM | POA: Diagnosis not present

## 2018-02-17 DIAGNOSIS — D631 Anemia in chronic kidney disease: Secondary | ICD-10-CM | POA: Diagnosis not present

## 2018-02-17 DIAGNOSIS — K219 Gastro-esophageal reflux disease without esophagitis: Secondary | ICD-10-CM | POA: Diagnosis not present

## 2018-02-17 DIAGNOSIS — Z9889 Other specified postprocedural states: Secondary | ICD-10-CM | POA: Diagnosis not present

## 2018-02-17 DIAGNOSIS — N2581 Secondary hyperparathyroidism of renal origin: Secondary | ICD-10-CM | POA: Diagnosis not present

## 2018-02-17 DIAGNOSIS — I5030 Unspecified diastolic (congestive) heart failure: Secondary | ICD-10-CM | POA: Diagnosis not present

## 2018-02-17 DIAGNOSIS — I272 Pulmonary hypertension, unspecified: Secondary | ICD-10-CM | POA: Diagnosis not present

## 2018-02-17 DIAGNOSIS — N184 Chronic kidney disease, stage 4 (severe): Secondary | ICD-10-CM | POA: Diagnosis not present

## 2018-02-17 DIAGNOSIS — N400 Enlarged prostate without lower urinary tract symptoms: Secondary | ICD-10-CM | POA: Diagnosis not present

## 2018-02-17 LAB — POCT HEMOGLOBIN-HEMACUE: Hemoglobin: 11.4 g/dL — ABNORMAL LOW (ref 13.0–17.0)

## 2018-02-17 NOTE — Progress Notes (Signed)
02/17/2018 Jeffrey Campbell   Dec 05, 1932  [id number]  Primary Physician [name], MD Primary Cardiologist: Dr. Radford Pax   Reason for Visit/CC: Preoperative Cardiovascular examination  HPI:  Jeffrey Campbell is a 84 y.o. male with a hx of ASCAD ([place] 2012 with 50-60% ostial LAD, 50-60%mLAD, 60% pRCA), HTN, dyslipidemia, mild MR with posterior MVP, bradycardia and moderate pulmonary HTN (followed by Dr. Aundra Dubin.He has had an extensive w/u for pulmonary HTN (Group 71from pulmonary venous HTN in setting of elevated left heart pressure) with PFTs showing mild COPD but moderate to severely reduced DLCO, neg VQ scan for PE and chest CT with no interstitial lung disease. He does have OSA but is intolerant to CPAP. He also has bilateral renal artery stenosis, mesenteric artery stenosis and distal aortic stenosis followed by Dr. Fletcher Anon.[place] 01/2015 showed mild pulmonary venous HTN with prominent V waves in the PCWP and RA. 2D echo showed mild MR with MVP so prominent V waves felt to be due to stiff ventricles/diastolic dysfunction in setting of poorly controlled HTN. He has chronic diastolic CHF.  Last echocardiogram was in June 2019 which showed normal left ventricular EF.  Aortic sclerosis without stenosis and mild MR.  Jeffrey Campbell presents to clinic today for preoperative cardiovascular examination prior to undergoing elective inguinal hernia surgery.  He reports that he has done well from a cardiac standpoint.  He denies any exertional chest pain or dyspnea.  Despite his age, he is very independent gets around well w/o use of assistive devices.  He does his own yard work and also hand washes his his own car.  He did this about 3 weeks ago.  He also reports that he can walk 4 blocks without any exertional chest pain or dyspnea.  His EKG shows sinus bradycardia with first-degree AV block and PACs.  Heart rate is 43 bpm.  This is consistent with his prior EKGs.  He has worn heart monitors in the past which  were unremarkable other than sinus bradycardia. No high grade blocks and no indication for PPM. For this reason he is not on any AV nodal blocking agents.  He has been completely asymptomatic with his bradycardia, denying fatigue, lightheadedness, dizziness, syncope/near syncope.  Also Of note, he was seen by his nephrologist Dr. Justin Mend today at [facility].  His blood pressure was noted to be elevated in the 790W systolic.  Dr. Justin Mend decided to increase his losartan to 50 mg twice daily and will plan to follow-up with this in a few weeks.  Dr. Radford Pax has already been made aware of the patient's need for surgery.  She has cleared pt to hold aspirin for surgery (see telephone encounter 02/09/18.    Current Meds  Medication Sig  . aspirin EC 81 MG tablet Take 81 mg by mouth daily.  Marland Kitchen atorvastatin (LIPITOR) 20 MG tablet TAKE 1 TABLET BY MOUTH EVERY DAY  . Brinzolamide-Brimonidine (SIMBRINZA) 1-0.2 % SUSP Place 1 drop into both eyes 2 (two) times daily.   Marland Kitchen doxazosin (CARDURA) 2 MG tablet TAKE 1 TABLET (2 MG TOTAL) DAILY BY MOUTH.  . feeding supplement, ENSURE ENLIVE, (ENSURE ENLIVE) LIQD Take 237 mLs by mouth 2 (two) times daily between meals.  . ferrous sulfate 325 (65 FE) MG tablet Take 325 mg by mouth daily with breakfast.  . furosemide (LASIX) 40 MG tablet Take 1 tablet (40 mg total) by mouth every other day.  . hydrALAZINE (APRESOLINE) 100 MG tablet Take 1 tablet (100 mg total) by mouth  3 (three) times daily. Please make yearly appt with Dr. Radford Pax for October. 1st attempt  . levothyroxine (SYNTHROID, LEVOTHROID) 137 MCG tablet Take 137 mcg by mouth daily before breakfast.   . Multiple Vitamin (MULTIVITAMIN WITH MINERALS) TABS tablet Take 1 tablet by mouth daily.  Marland Kitchen oxyCODONE-acetaminophen (PERCOCET) 5-325 MG tablet Take 1 tablet by mouth every 6 (six) hours as needed for severe pain.  . potassium chloride (KLOR-CON M10) 10 MEQ tablet Take 1 tablet (10 mEq total) by mouth daily.  .  QUEtiapine (SEROQUEL) 25 MG tablet Take 1 tablet (25 mg total) by mouth at bedtime.   Allergies  Allergen Reactions  . Iodinated Diagnostic Agents Other (See Comments)    Right heart cath 07/2016 allegedly "shut down his kidneys" Patient has stage III chronic kidney disease  . Zocor [Simvastatin] Other (See Comments)    Liver problems   . Budesonide-Formoterol Fumarate Other (See Comments)    Dry mouth  . Minoxidil Other (See Comments)  . Tiotropium Bromide Monohydrate Other (See Comments)    Dry mouth  . Tricor [Fenofibrate]         Past Medical History:  Diagnosis Date  . Anemia    low iron  . BPH (benign prostatic hyperplasia)   . CAD (coronary artery disease)   . Carotid artery stenosis    1-39% bilateral carotid artery stenosis and < 50% stenosis in the right CCA by dopplers 06/2017  . CKD (chronic kidney disease), stage III (HCC)    Stage 4  . Diastolic dysfunction   . Glaucoma   . Graves disease   . Heart murmur, systolic   . History of ETOH abuse   . History of PFTs 05/2010   moderate airflow obstruction w reduced DLCO by PFT   . Hyperlipemia   . Hypertension   . Hyperthyroidism 08/26/10   radioactive iodine therapy   . Mitral regurgitation echo 2015   mild  . Multiple thyroid nodules   . MVP (mitral valve prolapse) 11/2012   posterior MVP  . Organic impotence   . OSA (obstructive sleep apnea)    upper airway resistance syndrome with RDI 18/hr - not on CPAP due to insurance not covering  . Pre-diabetes   . PUD (peptic ulcer disease)   . Pulmonary hypertension ([place]) echo 2015   Group 2 with pulmonary venous HTN and Group 3 with OSA  . Shutdown renal    after cardiac cath  . Upper airway resistance syndrome    Family History  Problem Relation Age of Onset  . Kidney failure Father   . Hypertension Father   . Colon cancer Mother   . Colon cancer Brother   . Lung disease Brother   . Colon cancer Maternal Uncle    Past Surgical History:  Procedure  Laterality Date  . APPENDECTOMY    . AV FISTULA PLACEMENT Left 10/18/2017   Procedure: ARTERIOVENOUS (AV) FISTULA CREATION ARM;  Surgeon: Waynetta Sandy, MD;  Location: [facility];  Service: Vascular;  Laterality: Left;  . CARDIAC CATHETERIZATION    . CARDIAC CATHETERIZATION N/A 02/17/2015   Procedure: Right Heart Cath;  Surgeon: Larey Dresser, MD;  Location: [facility];  Service: Cardiovascular;  Laterality: N/A;  . ESOPHAGOGASTRODUODENOSCOPY (EGD) WITH PROPOFOL Left 10/16/2016   Procedure: ESOPHAGOGASTRODUODENOSCOPY (EGD) WITH PROPOFOL;  Surgeon: Ronnette Juniper, MD;  Location: [facility];  Service: Gastroenterology;  Laterality: Left;  . heart catherization    . HERNIA REPAIR  10/2009  . IR RADIOLOGIST EVAL &  MGMT  09/28/2016  . IR RADIOLOGIST EVAL & MGMT  10/19/2016  . IR RADIOLOGIST EVAL & MGMT  01/18/2017  . RIGHT HEART CATH N/A 07/28/2016   Procedure: Right Heart Cath;  Surgeon: Larey Dresser, MD;  Location: [facility];  Service: Cardiovascular;  Laterality: N/A;   Social History   Socioeconomic History  . Marital status: Married    Spouse name: Not on file  . Number of children: 4  . Years of education: Not on file  . Highest education level: Not on file  Occupational History  . Occupation: Retired    Fish farm manager: RETIRED    Comment: [employer]  Social Needs  . Financial resource strain: Not on file  . Food insecurity:    Worry: Not on file    Inability: Not on file  . Transportation needs:    Medical: Not on file    Non-medical: Not on file  Tobacco Use  . Smoking status: Former Smoker    Packs/day: 2.00    Years: 40.00    Pack years: 80.00    Types: Cigarettes    Last attempt to quit: 03/29/1984    Years since quitting: 33.9  . Smokeless tobacco: Never Used  Substance and Sexual Activity  . Alcohol use: No    Comment: quit in 1981  . Drug use: No  . Sexual activity: Not on file  Lifestyle  . Physical activity:    Days per week: Not on  file    Minutes per session: Not on file  . Stress: Not on file  Relationships  . Social connections:    Talks on phone: Not on file    Gets together: Not on file    Attends religious service: Not on file    Active member of club or organization: Not on file    Attends meetings of clubs or organizations: Not on file    Relationship status: Not on file  . Intimate partner violence:    Fear of current or ex partner: Not on file    Emotionally abused: Not on file    Physically abused: Not on file    Forced sexual activity: Not on file  Other Topics Concern  . Not on file  Social History Narrative  . Not on file     Review of Systems: General: negative for chills, fever, night sweats or weight changes.  Cardiovascular: negative for chest pain, dyspnea on exertion, edema, orthopnea, palpitations, paroxysmal nocturnal dyspnea or shortness of breath Dermatological: negative for rash Respiratory: negative for cough or wheezing Urologic: negative for hematuria Abdominal: negative for nausea, vomiting, diarrhea, bright red blood per rectum, melena, or hematemesis Neurologic: negative for visual changes, syncope, or dizziness All other systems reviewed and are otherwise negative except as noted above.   Physical Exam:  Height 5\' 8"  (1.727 m).  General appearance: alert, cooperative, no distress and elderly thinn appearing male Neck: no carotid bruit and no JVD Lungs: clear to auscultation bilaterally Heart: regular rate and rhythm, S1, S2 normal, no murmur, click, rub or gallop Extremities: extremities normal, atraumatic, no cyanosis or edema Pulses: 2+ and symmetric Skin: Skin color, texture, turgor normal. No rashes or lesions Neurologic: Grossly normal  EKG sinus bradycardia with first-degree AV block with premature PACs, consistent with prior EKGs.  Long history of sinus bradycardia and asymptomatic.-- personally reviewed   ASSESSMENT AND PLAN:   1. CAD: [place] 2012 with 50-60%  ostial LAD, 50-60%mLAD, 60% pRCA.Marland Kitchen He has been  treated w/ ASA and statin. No  blocker due to h/o bradycardia.  He denies any anginal symptomatology.  No exertional chest pain or dyspnea.  He is able to walk 4 blocks without any exertional symptoms.  Also hand washes his car without any exertional symptoms.  Continue medical therapy.  2. Preoperative Cardiovascular Assessment: 84 y/o male with h/o CAD as noted above, normal EF by echo 08/2017, mild MR, pulmonary HTN and PVD needing to undergo elective noncardiac surgery (inginal hernia).  He denies anginal symptomatology.  He is able to perform greater than 4 METS of physical activity without any exertional chest pain or dyspnea.  BP is elevated but he was seen by his nephrologist Dr. Justin Mend earlier this morning who recommended he increase his losartan to 50 mg twice daily.  Dr. Justin Mend plans to follow this.  His EKG shows market sinus bradycardia with first-degree AV block at 43 bpm.  He has a long history of this and EKG today is consistent with prior EKGs.  He has worn heart monitors in the past which showed sinus bradycardia but no high-grade blocks.  He is completely asymptomatic with his bradycardia.  No dizziness, syncope/near or fatigue.  He is not on any beta-blockers.  Other than his bradycardia, his physical exam is benign.  He had an echocardiogram in June which showed normal EF and mild MR.  Based on his lack of ischemic symptoms, there is no indication for further cardiac testing prior to surgery.  He can be cleared for hernia repair.  Dr. Radford Pax already notified and has cleared the patient to hold aspirin prior to surgery. Hold 5 days before and resume as soon as it is safe to do so from a surgical standpoint.  Recommend continuation of his statin during the perioperative period.  Given his bradycardia we recommend close monitoring on telemetry during the perioperative period.    Follow-Up w/ Dr. Radford Pax as planned in 2020 (recall in for May).     Jeffrey Campbell, MHS [facility] 02/17/2018 9:49 AM

## 2018-02-17 NOTE — Patient Instructions (Addendum)
Your physician recommends that you continue on your current medications as directed. Please refer to the Current Medication list given to you today. MAY HOLD ASPIRIN FOR SURGERY   Your physician recommends that you schedule a follow-up appointment in:  AS PLANNED  DR TURNER IN MAY

## 2018-02-22 DIAGNOSIS — H401121 Primary open-angle glaucoma, left eye, mild stage: Secondary | ICD-10-CM | POA: Diagnosis not present

## 2018-02-22 DIAGNOSIS — H401113 Primary open-angle glaucoma, right eye, severe stage: Secondary | ICD-10-CM | POA: Diagnosis not present

## 2018-02-27 ENCOUNTER — Ambulatory Visit (HOSPITAL_COMMUNITY)
Admission: RE | Admit: 2018-02-27 | Discharge: 2018-02-27 | Disposition: A | Discharge status: Home / Self Care | Payer: Medicare Other | Source: Ambulatory Visit | Attending: Nephrology | Admitting: Nephrology

## 2018-02-27 VITALS — BP 179/52 | HR 41 | Temp 98.6°F | Resp 20

## 2018-02-27 DIAGNOSIS — I130 Hypertensive heart and chronic kidney disease with heart failure and stage 1 through stage 4 chronic kidney disease, or unspecified chronic kidney disease: Secondary | ICD-10-CM | POA: Insufficient documentation

## 2018-02-27 DIAGNOSIS — N183 Chronic kidney disease, stage 3 (moderate): Secondary | ICD-10-CM | POA: Diagnosis not present

## 2018-02-27 DIAGNOSIS — D631 Anemia in chronic kidney disease: Secondary | ICD-10-CM | POA: Insufficient documentation

## 2018-02-27 LAB — IRON AND TIBC
IRON: 64 ug/dL (ref 45–182)
Saturation Ratios: 23 % (ref 17.9–39.5)
TIBC: 280 ug/dL (ref 250–450)
UIBC: 216 ug/dL

## 2018-02-27 LAB — FERRITIN: FERRITIN: 162 ng/mL (ref 24–336)

## 2018-02-27 LAB — POCT HEMOGLOBIN-HEMACUE: Hemoglobin: 11.8 g/dL — ABNORMAL LOW (ref 13.0–17.0)

## 2018-02-27 MED ORDER — EPOETIN ALFA 3000 UNIT/ML IJ SOLN
INTRAMUSCULAR | Status: AC
  Administered 2018-02-27: 3000 [IU]
  Filled 2018-02-27: qty 1

## 2018-02-27 MED ORDER — EPOETIN ALFA 10000 UNIT/ML IJ SOLN: 5000.0000 [IU] | INTRAMUSCULAR | Status: DC

## 2018-02-27 MED ORDER — EPOETIN ALFA 2000 UNIT/ML IJ SOLN
INTRAMUSCULAR | Status: AC
  Administered 2018-02-27: 2000 [IU]
  Filled 2018-02-27: qty 1

## 2018-03-06 ENCOUNTER — Ambulatory Visit: Payer: Medicare Other | Admitting: Physician Assistant

## 2018-03-06 ENCOUNTER — Encounter (HOSPITAL_COMMUNITY): Payer: Medicare Other

## 2018-03-09 ENCOUNTER — Ambulatory Visit (HOSPITAL_COMMUNITY)
Admission: RE | Admit: 2018-03-09 | Discharge: 2018-03-09 | Disposition: A | Discharge status: Home / Self Care | Payer: Medicare Other | Source: Ambulatory Visit | Attending: Nephrology | Admitting: Nephrology

## 2018-03-09 VITALS — BP 122/66 | HR 43 | Temp 97.5°F | Resp 20

## 2018-03-09 DIAGNOSIS — I130 Hypertensive heart and chronic kidney disease with heart failure and stage 1 through stage 4 chronic kidney disease, or unspecified chronic kidney disease: Secondary | ICD-10-CM | POA: Diagnosis not present

## 2018-03-09 LAB — POCT HEMOGLOBIN-HEMACUE: Hemoglobin: 10.8 g/dL — ABNORMAL LOW (ref 13.0–17.0)

## 2018-03-09 MED ORDER — EPOETIN ALFA 10000 UNIT/ML IJ SOLN: 5000.0000 [IU] | INTRAMUSCULAR | Status: DC

## 2018-03-09 MED ORDER — EPOETIN ALFA 2000 UNIT/ML IJ SOLN
INTRAMUSCULAR | Status: AC
  Administered 2018-03-09: 2000 [IU] via SUBCUTANEOUS
  Filled 2018-03-09: qty 1

## 2018-03-09 MED ORDER — EPOETIN ALFA 3000 UNIT/ML IJ SOLN
INTRAMUSCULAR | Status: AC
  Administered 2018-03-09: 3000 [IU] via SUBCUTANEOUS
  Filled 2018-03-09: qty 1

## 2018-03-14 ENCOUNTER — Ambulatory Visit: Payer: Medicare Other | Admitting: Physician Assistant

## 2018-03-16 ENCOUNTER — Ambulatory Visit (HOSPITAL_COMMUNITY)
Admission: RE | Admit: 2018-03-16 | Discharge: 2018-03-16 | Disposition: A | Discharge status: Home / Self Care | Payer: Medicare Other | Source: Ambulatory Visit | Attending: Nephrology | Admitting: Nephrology

## 2018-03-16 VITALS — BP 210/60 | HR 44 | Temp 97.7°F

## 2018-03-16 DIAGNOSIS — K219 Gastro-esophageal reflux disease without esophagitis: Secondary | ICD-10-CM | POA: Diagnosis not present

## 2018-03-16 DIAGNOSIS — J449 Chronic obstructive pulmonary disease, unspecified: Secondary | ICD-10-CM | POA: Diagnosis not present

## 2018-03-16 DIAGNOSIS — I5030 Unspecified diastolic (congestive) heart failure: Secondary | ICD-10-CM | POA: Diagnosis not present

## 2018-03-16 DIAGNOSIS — I130 Hypertensive heart and chronic kidney disease with heart failure and stage 1 through stage 4 chronic kidney disease, or unspecified chronic kidney disease: Secondary | ICD-10-CM | POA: Diagnosis not present

## 2018-03-16 DIAGNOSIS — I272 Pulmonary hypertension, unspecified: Secondary | ICD-10-CM | POA: Diagnosis not present

## 2018-03-16 DIAGNOSIS — I129 Hypertensive chronic kidney disease with stage 1 through stage 4 chronic kidney disease, or unspecified chronic kidney disease: Secondary | ICD-10-CM | POA: Diagnosis not present

## 2018-03-16 DIAGNOSIS — N2581 Secondary hyperparathyroidism of renal origin: Secondary | ICD-10-CM | POA: Diagnosis not present

## 2018-03-16 DIAGNOSIS — Z8719 Personal history of other diseases of the digestive system: Secondary | ICD-10-CM | POA: Diagnosis not present

## 2018-03-16 DIAGNOSIS — D631 Anemia in chronic kidney disease: Secondary | ICD-10-CM | POA: Diagnosis not present

## 2018-03-16 DIAGNOSIS — I2510 Atherosclerotic heart disease of native coronary artery without angina pectoris: Secondary | ICD-10-CM | POA: Diagnosis not present

## 2018-03-16 DIAGNOSIS — N400 Enlarged prostate without lower urinary tract symptoms: Secondary | ICD-10-CM | POA: Diagnosis not present

## 2018-03-16 DIAGNOSIS — Z9889 Other specified postprocedural states: Secondary | ICD-10-CM | POA: Diagnosis not present

## 2018-03-16 DIAGNOSIS — N184 Chronic kidney disease, stage 4 (severe): Secondary | ICD-10-CM | POA: Diagnosis not present

## 2018-03-16 LAB — RENAL FUNCTION PANEL
Albumin: 4.3 g/dL (ref 3.5–5.0)
Anion gap: 12 (ref 5–15)
BUN: 72 mg/dL — ABNORMAL HIGH (ref 8–23)
CALCIUM: 9.5 mg/dL (ref 8.9–10.3)
CO2: 18 mmol/L — ABNORMAL LOW (ref 22–32)
Chloride: 109 mmol/L (ref 98–111)
Creatinine, Ser: 3.65 mg/dL — ABNORMAL HIGH (ref 0.61–1.24)
GFR calc non Af Amer: 14 mL/min — ABNORMAL LOW (ref >60)
GFR, EST AFRICAN AMERICAN: 17 mL/min — AB (ref >60)
Glucose, Bld: 96 mg/dL (ref 70–99)
Phosphorus: 4.3 mg/dL (ref 2.5–4.6)
Potassium: 4.4 mmol/L (ref 3.5–5.1)
Sodium: 139 mmol/L (ref 135–145)

## 2018-03-16 LAB — POCT HEMOGLOBIN-HEMACUE: Hemoglobin: 10.9 g/dL — ABNORMAL LOW (ref 13.0–17.0)

## 2018-03-16 MED ORDER — CLONIDINE HCL 0.1 MG PO TABS
ORAL_TABLET | ORAL | Status: AC
  Filled 2018-03-16: qty 1

## 2018-03-16 MED ORDER — EPOETIN ALFA 10000 UNIT/ML IJ SOLN: 5000.0000 [IU] | INTRAMUSCULAR | Status: DC

## 2018-03-16 MED ORDER — CLONIDINE HCL 0.1 MG PO TABS
0.1000 mg | ORAL_TABLET | Freq: Once | ORAL | Status: AC | PRN
  Administered 2018-03-16: 0.1 mg via ORAL

## 2018-03-16 NOTE — Progress Notes (Signed)
Pt did not take his a.m. BP meds today. We could not get his blood pressure into the acceptable range for his shot even after giving clonidine. (last one being 210/60) Dr Justin Mend notified. Shot to be held today as his hgb is 10.9. We will reinforce the need to take meds before coming in and reschedule his shot.

## 2018-03-20 ENCOUNTER — Encounter (HOSPITAL_COMMUNITY): Payer: Medicare Other

## 2018-03-23 ENCOUNTER — Ambulatory Visit (HOSPITAL_COMMUNITY)
Admission: RE | Admit: 2018-03-23 | Discharge: 2018-03-23 | Disposition: A | Discharge status: Home / Self Care | Payer: Medicare Other | Source: Ambulatory Visit | Attending: Nephrology | Admitting: Nephrology

## 2018-03-23 VITALS — BP 141/45 | HR 45 | Temp 97.6°F | Resp 20

## 2018-03-23 DIAGNOSIS — D631 Anemia in chronic kidney disease: Secondary | ICD-10-CM | POA: Diagnosis not present

## 2018-03-23 DIAGNOSIS — Z79899 Other long term (current) drug therapy: Secondary | ICD-10-CM | POA: Insufficient documentation

## 2018-03-23 DIAGNOSIS — Z5181 Encounter for therapeutic drug level monitoring: Secondary | ICD-10-CM | POA: Insufficient documentation

## 2018-03-23 DIAGNOSIS — N183 Chronic kidney disease, stage 3 (moderate): Secondary | ICD-10-CM | POA: Diagnosis not present

## 2018-03-23 DIAGNOSIS — I130 Hypertensive heart and chronic kidney disease with heart failure and stage 1 through stage 4 chronic kidney disease, or unspecified chronic kidney disease: Secondary | ICD-10-CM

## 2018-03-23 MED ORDER — EPOETIN ALFA 10000 UNIT/ML IJ SOLN: 5000.0000 [IU] | INTRAMUSCULAR | Status: DC

## 2018-03-23 MED ORDER — EPOETIN ALFA 3000 UNIT/ML IJ SOLN
INTRAMUSCULAR | Status: AC
  Administered 2018-03-23: 3000 [IU]
  Filled 2018-03-23: qty 1

## 2018-03-23 MED ORDER — EPOETIN ALFA 2000 UNIT/ML IJ SOLN
INTRAMUSCULAR | Status: AC
  Administered 2018-03-23: 2000 [IU]
  Filled 2018-03-23: qty 1

## 2018-03-24 LAB — POCT HEMOGLOBIN-HEMACUE: Hemoglobin: 11.5 g/dL — ABNORMAL LOW (ref 13.0–17.0)

## 2018-03-27 ENCOUNTER — Encounter (HOSPITAL_COMMUNITY): Payer: Medicare Other

## 2018-03-28 NOTE — Pre-Procedure Instructions (Signed)
[name]  03/28/2018      [id number] [place] [place] [address] [place] [address] Phone: [phone] Fax: [phone]  [facility] [place] [place] [facility] [place] [address] Phone: [phone] Fax: [phone]    Your procedure is scheduled on January 8th.  Report to [facility] Admitting at 8:40 A.M.  Call this number if you have problems the morning of surgery:  [phone]   Remember:  Do not eat or drink after midnight.   You may drink clear liquids until 7:40 AM .  Clear liquids allowed are:                     Water, Juice (non-citric and without pulp), Carbonated beverages, Clear Tea, Black Coffee only, Plain Jell-O only and Gatorade    Take these medicines the morning of surgery with A SIP OF WATER   Brinzolamide-Brimonidine (Simbrinza)  Levothyroxine (Synthroid)  Follow your surgeon's instructions on when to stop Asprin.  If no instructions were given by your surgeon then you will need to call the office to get those instructions.    7 days prior to surgery STOP taking any Aspirin (unless otherwise instructed by your surgeon), Aleve, Naproxen, Ibuprofen, Motrin, Advil, Goody's, BC's, all herbal medications, fish oil, and all vitamins.     Do not wear jewelry.  Do not wear lotions, powders, colognes, or deodorant.  Do not shave 48 hours prior to surgery.  Men may shave face and neck.  Do not bring valuables to the hospital.  [facility] is not responsible for any belongings or valuables.   [facility]- Preparing For Surgery  Before surgery, you can play an important role. Because skin is not sterile, your skin needs to be as free of germs as possible. You can reduce the number of germs on your skin by washing with CHG (chlorahexidine gluconate) Soap before surgery.   CHG is an antiseptic cleaner which kills germs and bonds with the skin to continue killing germs even after washing.    Oral Hygiene is also important to reduce your risk of infection.  Remember - BRUSH YOUR TEETH THE MORNING OF SURGERY WITH YOUR REGULAR TOOTHPASTE  Please do not use if you have an allergy to CHG or antibacterial soaps. If your skin becomes reddened/irritated stop using the CHG.  Do not shave (including legs and underarms) for at least 48 hours prior to first CHG shower. It is OK to shave your face.  Please follow these instructions carefully.   1. Shower the NIGHT BEFORE SURGERY and the MORNING OF SURGERY with CHG.   2. If you chose to wash your hair, wash your hair first as usual with your normal shampoo.  3. After you shampoo, rinse your hair and body thoroughly to remove the shampoo.  4. Use CHG as you would any other liquid soap. You can apply CHG directly to the skin and wash gently with a scrungie or a clean washcloth.   5. Apply the CHG Soap to your body ONLY FROM THE NECK DOWN.  Do not use on open wounds or open sores. Avoid contact with your eyes, ears, mouth and genitals (private parts). Wash Face and genitals (private parts)  with your normal soap.  6. Wash thoroughly, paying special attention to the area where  your surgery will be performed.  7. Thoroughly rinse your body with warm water from the neck down.  8. DO NOT shower/wash with your normal soap after using and rinsing off the CHG Soap.  9. Pat yourself dry with a CLEAN TOWEL.  10. Wear CLEAN PAJAMAS to bed the night before surgery, wear comfortable clothes the morning of surgery  11. Place CLEAN SHEETS on your bed the night of your first shower and DO NOT SLEEP WITH PETS.   Day of Surgery:  Do not apply any deodorants/lotions.  Please wear clean clothes to the hospital/surgery center.   Remember to brush your teeth WITH YOUR REGULAR TOOTHPASTE.   Contacts, dentures or bridgework may not be  worn into surgery.  Leave your suitcase in the car.  After surgery it may be brought to your room.  For patients admitted to the hospital, discharge time will be determined by your treatment team.  Patients discharged the day of surgery will not be allowed to drive home.   Please read over the following fact sheets that you were given. Coughing and Deep Breathing and Surgical Site Infection Prevention

## 2018-03-30 ENCOUNTER — Encounter (HOSPITAL_COMMUNITY)
Admission: RE | Admit: 2018-03-30 | Discharge: 2018-03-30 | Disposition: A | Discharge status: Home / Self Care | Payer: Medicare Other | Source: Ambulatory Visit | Attending: Nephrology | Admitting: Nephrology

## 2018-03-30 ENCOUNTER — Encounter (HOSPITAL_COMMUNITY)
Admission: RE | Admit: 2018-03-30 | Discharge: 2018-03-30 | Disposition: A | Discharge status: Home / Self Care | Payer: Medicare Other | Source: Ambulatory Visit | Attending: Surgery | Admitting: Surgery

## 2018-03-30 VITALS — BP 165/46 | HR 43 | Temp 98.4°F | Resp 20

## 2018-03-30 DIAGNOSIS — I5032 Chronic diastolic (congestive) heart failure: Secondary | ICD-10-CM | POA: Diagnosis not present

## 2018-03-30 DIAGNOSIS — N183 Chronic kidney disease, stage 3 (moderate): Secondary | ICD-10-CM | POA: Insufficient documentation

## 2018-03-30 DIAGNOSIS — I2510 Atherosclerotic heart disease of native coronary artery without angina pectoris: Secondary | ICD-10-CM | POA: Insufficient documentation

## 2018-03-30 DIAGNOSIS — H409 Unspecified glaucoma: Secondary | ICD-10-CM | POA: Insufficient documentation

## 2018-03-30 DIAGNOSIS — R7303 Prediabetes: Secondary | ICD-10-CM | POA: Insufficient documentation

## 2018-03-30 DIAGNOSIS — Z79890 Hormone replacement therapy (postmenopausal): Secondary | ICD-10-CM | POA: Insufficient documentation

## 2018-03-30 DIAGNOSIS — D649 Anemia, unspecified: Secondary | ICD-10-CM | POA: Insufficient documentation

## 2018-03-30 DIAGNOSIS — D631 Anemia in chronic kidney disease: Secondary | ICD-10-CM | POA: Diagnosis present

## 2018-03-30 DIAGNOSIS — Z8711 Personal history of peptic ulcer disease: Secondary | ICD-10-CM | POA: Diagnosis not present

## 2018-03-30 DIAGNOSIS — I130 Hypertensive heart and chronic kidney disease with heart failure and stage 1 through stage 4 chronic kidney disease, or unspecified chronic kidney disease: Secondary | ICD-10-CM | POA: Insufficient documentation

## 2018-03-30 DIAGNOSIS — Z87891 Personal history of nicotine dependence: Secondary | ICD-10-CM | POA: Insufficient documentation

## 2018-03-30 DIAGNOSIS — Z79899 Other long term (current) drug therapy: Secondary | ICD-10-CM | POA: Diagnosis not present

## 2018-03-30 DIAGNOSIS — I272 Pulmonary hypertension, unspecified: Secondary | ICD-10-CM | POA: Diagnosis not present

## 2018-03-30 DIAGNOSIS — Z7982 Long term (current) use of aspirin: Secondary | ICD-10-CM | POA: Insufficient documentation

## 2018-03-30 DIAGNOSIS — K4090 Unilateral inguinal hernia, without obstruction or gangrene, not specified as recurrent: Secondary | ICD-10-CM | POA: Insufficient documentation

## 2018-03-30 DIAGNOSIS — I341 Nonrheumatic mitral (valve) prolapse: Secondary | ICD-10-CM | POA: Insufficient documentation

## 2018-03-30 DIAGNOSIS — Z01818 Encounter for other preprocedural examination: Secondary | ICD-10-CM | POA: Diagnosis not present

## 2018-03-30 DIAGNOSIS — G4733 Obstructive sleep apnea (adult) (pediatric): Secondary | ICD-10-CM | POA: Diagnosis not present

## 2018-03-30 DIAGNOSIS — I6523 Occlusion and stenosis of bilateral carotid arteries: Secondary | ICD-10-CM | POA: Insufficient documentation

## 2018-03-30 DIAGNOSIS — E785 Hyperlipidemia, unspecified: Secondary | ICD-10-CM | POA: Diagnosis not present

## 2018-03-30 LAB — BASIC METABOLIC PANEL
Anion gap: 11 (ref 5–15)
BUN: 91 mg/dL — ABNORMAL HIGH (ref 8–23)
CO2: 21 mmol/L — ABNORMAL LOW (ref 22–32)
Calcium: 9.6 mg/dL (ref 8.9–10.3)
Chloride: 108 mmol/L (ref 98–111)
Creatinine, Ser: 4.61 mg/dL — ABNORMAL HIGH (ref 0.61–1.24)
GFR calc Af Amer: 13 mL/min — ABNORMAL LOW (ref >60)
GFR calc non Af Amer: 11 mL/min — ABNORMAL LOW (ref >60)
Glucose, Bld: 101 mg/dL — ABNORMAL HIGH (ref 70–99)
Potassium: 4.6 mmol/L (ref 3.5–5.1)
Sodium: 140 mmol/L (ref 135–145)

## 2018-03-30 LAB — IRON AND TIBC
Iron: 39 ug/dL — ABNORMAL LOW (ref 45–182)
Saturation Ratios: 15 % — ABNORMAL LOW (ref 17.9–39.5)
TIBC: 259 ug/dL (ref 250–450)
UIBC: 220 ug/dL

## 2018-03-30 LAB — CBC
HCT: 34.2 % — ABNORMAL LOW (ref 39.0–52.0)
Hemoglobin: 10.5 g/dL — ABNORMAL LOW (ref 13.0–17.0)
MCH: 29.7 pg (ref 26.0–34.0)
MCHC: 30.7 g/dL (ref 30.0–36.0)
MCV: 96.6 fL (ref 80.0–100.0)
NRBC: 0 % (ref 0.0–0.2)
Platelets: 166 10*3/uL (ref 150–400)
RBC: 3.54 MIL/uL — ABNORMAL LOW (ref 4.22–5.81)
RDW: 14.7 % (ref 11.5–15.5)
WBC: 5.8 10*3/uL (ref 4.0–10.5)

## 2018-03-30 LAB — FERRITIN: FERRITIN: 161 ng/mL (ref 24–336)

## 2018-03-30 LAB — POCT HEMOGLOBIN-HEMACUE: HEMOGLOBIN: 10.9 g/dL — AB (ref 13.0–17.0)

## 2018-03-30 MED ORDER — CHLORHEXIDINE GLUCONATE CLOTH 2 % EX PADS: 6.0000 | MEDICATED_PAD | Freq: Once | CUTANEOUS | Status: DC

## 2018-03-30 MED ORDER — EPOETIN ALFA 10000 UNIT/ML IJ SOLN: 5000.0000 [IU] | INTRAMUSCULAR | Status: DC

## 2018-03-30 MED ORDER — EPOETIN ALFA 3000 UNIT/ML IJ SOLN
INTRAMUSCULAR | Status: AC
  Administered 2018-03-30: 3000 [IU] via SUBCUTANEOUS
  Filled 2018-03-30: qty 1

## 2018-03-30 MED ORDER — EPOETIN ALFA 2000 UNIT/ML IJ SOLN
INTRAMUSCULAR | Status: AC
  Administered 2018-03-30: 2000 [IU] via SUBCUTANEOUS
  Filled 2018-03-30: qty 1

## 2018-03-31 ENCOUNTER — Encounter (HOSPITAL_COMMUNITY): Payer: Self-pay | Admitting: Vascular Surgery

## 2018-03-31 NOTE — Progress Notes (Signed)
Anesthesia Chart Review:  Case:  [id number] Date/Time:  04/05/18 1025   Procedures:      RIGHT INGUINAL HERNIA REPAIR ERAS PATHWAY (Right )     INSERTION OF MESH (Right )   Anesthesia type:  General   Pre-op diagnosis:  Right inguinal hernia   Location:  [facility]   Surgeon:  Donnie Mesa, MD      DISCUSSION: 85 yo male former smoker. Pertinent hx includes ASCAD ([place] 2012 with 50-60% ostial LAD, 50-60%mLAD, 60% pRCA), HTN, dyslipidemia, mild MR with posterior MVP, bradycardia and moderate pulmonary HTN (followed by Dr. Aundra Dubin.He has had an extensive w/u for pulmonary HTN (Group 27from pulmonary venous HTN in setting of elevated left heart pressure) with PFTs showing mild COPD but moderate to severely reduced DLCO, neg VQ scan for PE and chest CT with no interstitial lung disease. He does have OSA but is intolerant to CPAP. He also has bilateral renal artery stenosis, mesenteric artery stenosis and distal aortic stenosis followed by Dr. Fletcher Anon.[place] 01/2015 showed mild pulmonary venous HTN with prominent V waves in the PCWP and RA. 2D echo showed mild MR with MVP so prominent V waves felt to be due to stiff ventricles/diastolic dysfunction in setting of poorly controlled HTN. He has chronic diastolic CHF.  Last echocardiogram was in June 2019 which showed normal left ventricular EF.  Aortic sclerosis without stenosis and mild MR. He has CKD IV and had Left brachiocephalic AV fistula creation 7/19 but is not yet on HD.  Pt was seen 02/17/2018 by Lyda Jester, PA-C for preop clearance. See her note for thorough review of history. Preop cardiac assessment copied below: "85 y/o male with h/o CAD as noted above, normal EF by echo 08/2017, mild MR, pulmonary HTN and PVD needing to undergo elective noncardiac surgery (inginal hernia).  He denies anginal symptomatology.  He is able to perform greater than 4 METS of physical activity without any exertional chest pain or dyspnea.  BP is  elevated but he was seen by his nephrologist Dr. Justin Mend earlier this morning who recommended he increase his losartan to 50 mg twice daily.  Dr. Justin Mend plans to follow this.  His EKG shows market sinus bradycardia with first-degree AV block at 43 bpm.  He has a long history of this and EKG today is consistent with prior EKGs.  He has worn heart monitors in the past which showed sinus bradycardia but no high-grade blocks.  He is completely asymptomatic with his bradycardia.  No dizziness, syncope/near or fatigue.  He is not on any beta-blockers.  Other than his bradycardia, his physical exam is benign.  He had an echocardiogram in June which showed normal EF and mild MR.  Based on his lack of ischemic symptoms, there is no indication for further cardiac testing prior to surgery.  He can be cleared for hernia repair.  Dr. Radford Pax already notified and has cleared the patient to hold aspirin prior to surgery. Hold 5 days before and resume as soon as it is safe to do so from a surgical standpoint.  Recommend continuation of his statin during the perioperative period.  Given his bradycardia we recommend close monitoring on telemetry during the perioperative period."  Pt follows with Dr. Justin Mend for CKD that is progressing towards needing HD as well as poorly controlled HTN. Pt had AV fistula creation 09/2017 but not yet on HD. On PAT labs 03/30/18 his creatinine is 4.61 which is up from 3.65 on 03/16/18.  I called Dr. Johnney Ou (she is covering for Dr. Justin Mend) to discuss worsening renal function. She advised that pt's surgery needs to be delayed while they eval worsening kidney function. He is scheduled to be seen by nephrology 1/7 @ 3:45. I called Abigail Butts at [facility] to let her know. She will call pt.  Ability to proceed with surgery pending nephrology clearance.   VS: BP (!) 170/48 Comment: taken manually/notified Kathie G. RN  Pulse (!) 45   Temp (!) 36.4 C   Ht 5\' 9"  (1.753 m)   Wt 61.1 kg   SpO2 100%   BMI 19.91 kg/m    PROVIDERS: [name], MD is PCP  [name], MD is Nephrologist  LABS: Worsening creatinine, to be eval'd by nephrology prior to having surgery (all labs ordered are listed, but only abnormal results are displayed)  Labs Reviewed  CBC - Abnormal; Notable for the following components:      Result Value   RBC 3.54 (*)    Hemoglobin 10.5 (*)    HCT 34.2 (*)    All other components within normal limits  BASIC METABOLIC PANEL - Abnormal; Notable for the following components:   CO2 21 (*)    Glucose, Bld 101 (*)    BUN 91 (*)    Creatinine, Ser 4.61 (*)    GFR calc non Af Amer 11 (*)    GFR calc Af Amer 13 (*)    All other components within normal limits     IMAGES: MRA Abdomen 10/19/2016: Aorta:  Normal caliber common no evidence of aneurysm or dissection.  Celiac: Origin is not imaged. Conventional hepatic arterial anatomy.  SMA:  Patent and unremarkable.  Next item  Right Renal artery: High-grade (approximately 70%) stenosis at the origin of the right renal artery. No changes of fibromuscular dysplasia.  Left Renal artery: High-grade stenosis versus occlusion of the origin of the left renal artery. There is slow flow in the remainder of the left renal artery.  IMA:  Patent and unremarkable.  Nonvascular: Cardiomegaly with left ventricular dilatation. Small right pleural effusion. No acute abnormality or enhancement identified.  IMPRESSION: Positive for bilateral (left worse than right) high-grade renal artery stenoses.  EKG: 02/17/2018: Marked sinus bradycardia with 1st degree AV block with PACs in pattern of bigeminy. Possible LAE. LVH with repol abnormality.  CV: TTE 08/30/2017: Study Conclusions  - Left ventricle: The cavity size was normal. Wall thickness was   increased in a pattern of moderate LVH. Systolic function was   normal. The estimated ejection fraction was in the range of 60%   to 65%. Wall motion was normal; there were no  regional wall   motion abnormalities. Doppler parameters are consistent with   pseudonormal left ventricular relaxation (grade 2 diastolic   dysfunction). The E/e&' ratio is >15, suggesting elevated LV   filling pressure. - Aortic valve: Sclerosis without stenosis. Transvalvular velocity   was minimally increased. There was trivial regurgitation. Mean   gradient (S): 8 mm Hg. - Mitral valve: Mildly thickened leaflets . There was mild   regurgitation. - Left atrium: Moderately dilated. - Right atrium: The atrium was mildly dilated. - Tricuspid valve: There was moderate regurgitation. - Pulmonary arteries: PA peak pressure: 62 mm Hg (S). - Inferior vena cava: The vessel was dilated. The respirophasic   diameter changes were blunted (< 50%), consistent with elevated   central venous pressure.  Impressions:  - Compared to a prior study in 2018, the RVSP is higher at  62 mmHg   (up from 49 mmHg) - LV filling pressure is also elevated.  Past Medical History:  Diagnosis Date  . Anemia    low iron  . BPH (benign prostatic hyperplasia)   . CAD (coronary artery disease)   . Carotid artery stenosis    1-39% bilateral carotid artery stenosis and < 50% stenosis in the right CCA by dopplers 06/2017  . CKD (chronic kidney disease), stage III (HCC)    Stage 4  . Diastolic dysfunction   . Glaucoma   . Graves disease   . Heart murmur, systolic   . History of ETOH abuse   . History of PFTs 05/2010   moderate airflow obstruction w reduced DLCO by PFT   . Hyperlipemia   . Hypertension   . Hyperthyroidism 08/26/10   radioactive iodine therapy   . Mitral regurgitation echo 2015   mild  . Multiple thyroid nodules   . MVP (mitral valve prolapse) 11/2012   posterior MVP  . Organic impotence   . OSA (obstructive sleep apnea)    upper airway resistance syndrome with RDI 18/hr - not on CPAP due to insurance not covering  . Pre-diabetes   . PUD (peptic ulcer disease)   . Pulmonary hypertension  ([place]) echo 2015   Group 2 with pulmonary venous HTN and Group 3 with OSA  . Shutdown renal    after cardiac cath  . Upper airway resistance syndrome     Past Surgical History:  Procedure Laterality Date  . APPENDECTOMY    . AV FISTULA PLACEMENT Left 10/18/2017   Procedure: ARTERIOVENOUS (AV) FISTULA CREATION ARM;  Surgeon: Waynetta Sandy, MD;  Location: [facility];  Service: Vascular;  Laterality: Left;  . CARDIAC CATHETERIZATION    . CARDIAC CATHETERIZATION N/A 02/17/2015   Procedure: Right Heart Cath;  Surgeon: Larey Dresser, MD;  Location: [facility];  Service: Cardiovascular;  Laterality: N/A;  . ESOPHAGOGASTRODUODENOSCOPY (EGD) WITH PROPOFOL Left 10/16/2016   Procedure: ESOPHAGOGASTRODUODENOSCOPY (EGD) WITH PROPOFOL;  Surgeon: Ronnette Juniper, MD;  Location: [facility];  Service: Gastroenterology;  Laterality: Left;  . heart catherization    . HERNIA REPAIR  10/2009  . IR RADIOLOGIST EVAL & MGMT  09/28/2016  . IR RADIOLOGIST EVAL & MGMT  10/19/2016  . IR RADIOLOGIST EVAL & MGMT  01/18/2017  . RIGHT HEART CATH N/A 07/28/2016   Procedure: Right Heart Cath;  Surgeon: Larey Dresser, MD;  Location: [facility];  Service: Cardiovascular;  Laterality: N/A;    MEDICATIONS: . amLODipine (NORVASC) 10 MG tablet  . aspirin EC 81 MG tablet  . atorvastatin (LIPITOR) 20 MG tablet  . Brinzolamide-Brimonidine (SIMBRINZA) 1-0.2 % SUSP  . doxazosin (CARDURA) 4 MG tablet  . Epoetin Alfa (PROCRIT IJ)  . feeding supplement, ENSURE ENLIVE, (ENSURE ENLIVE) LIQD  . ferrous sulfate 325 (65 FE) MG tablet  . furosemide (LASIX) 40 MG tablet  . hydrALAZINE (APRESOLINE) 100 MG tablet  . levothyroxine (SYNTHROID, LEVOTHROID) 150 MCG tablet  . losartan (COZAAR) 25 MG tablet  . Multiple Vitamin (MULTIVITAMIN WITH MINERALS) TABS tablet  . potassium chloride (KLOR-CON M10) 10 MEQ tablet   No current facility-administered medications for this encounter.     Wynonia Musty [facility]/Anesthesiology Phone [phone] 03/31/2018 3:57 PM

## 2018-03-31 NOTE — Anesthesia Preprocedure Evaluation (Deleted)
Anesthesia Evaluation    Airway        Dental   Pulmonary former smoker,           Cardiovascular hypertension,      Neuro/Psych    GI/Hepatic   Endo/Other    Renal/GU      Musculoskeletal   Abdominal   Peds  Hematology   Anesthesia Other Findings   Reproductive/Obstetrics                             Anesthesia Physical Anesthesia Plan  ASA:   Anesthesia Plan:    Post-op Pain Management:    Induction:   PONV Risk Score and Plan:   Airway Management Planned:   Additional Equipment:   Intra-op Plan:   Post-operative Plan:   Informed Consent:   Plan Discussed with:   Anesthesia Plan Comments: (See PAT note by Marline Morace, PA-C )        Anesthesia Quick Evaluation  

## 2018-04-04 DIAGNOSIS — I129 Hypertensive chronic kidney disease with stage 1 through stage 4 chronic kidney disease, or unspecified chronic kidney disease: Secondary | ICD-10-CM | POA: Diagnosis not present

## 2018-04-04 DIAGNOSIS — N189 Chronic kidney disease, unspecified: Secondary | ICD-10-CM | POA: Diagnosis not present

## 2018-04-04 DIAGNOSIS — I272 Pulmonary hypertension, unspecified: Secondary | ICD-10-CM | POA: Diagnosis not present

## 2018-04-04 DIAGNOSIS — D631 Anemia in chronic kidney disease: Secondary | ICD-10-CM | POA: Diagnosis not present

## 2018-04-04 DIAGNOSIS — Z8719 Personal history of other diseases of the digestive system: Secondary | ICD-10-CM | POA: Diagnosis not present

## 2018-04-04 DIAGNOSIS — I5030 Unspecified diastolic (congestive) heart failure: Secondary | ICD-10-CM | POA: Diagnosis not present

## 2018-04-04 DIAGNOSIS — N184 Chronic kidney disease, stage 4 (severe): Secondary | ICD-10-CM | POA: Diagnosis not present

## 2018-04-05 ENCOUNTER — Ambulatory Visit (HOSPITAL_COMMUNITY): Admission: RE | Admit: 2018-04-05 | Payer: Medicare Other | Source: Home / Self Care | Admitting: Surgery

## 2018-04-05 ENCOUNTER — Encounter (HOSPITAL_COMMUNITY): Admission: RE | Payer: Self-pay | Source: Home / Self Care

## 2018-04-05 SURGERY — REPAIR, HERNIA, INGUINAL, ADULT
Anesthesia: General | Laterality: Right

## 2018-04-06 ENCOUNTER — Ambulatory Visit (HOSPITAL_COMMUNITY)
Admission: RE | Admit: 2018-04-06 | Discharge: 2018-04-06 | Disposition: A | Discharge status: Home / Self Care | Payer: Medicare Other | Source: Ambulatory Visit | Attending: Nephrology | Admitting: Nephrology

## 2018-04-06 VITALS — BP 165/50 | HR 42 | Temp 97.9°F | Resp 20

## 2018-04-06 DIAGNOSIS — I130 Hypertensive heart and chronic kidney disease with heart failure and stage 1 through stage 4 chronic kidney disease, or unspecified chronic kidney disease: Secondary | ICD-10-CM

## 2018-04-06 LAB — POCT HEMOGLOBIN-HEMACUE: Hemoglobin: 10.6 g/dL — ABNORMAL LOW (ref 13.0–17.0)

## 2018-04-06 MED ORDER — EPOETIN ALFA 2000 UNIT/ML IJ SOLN
INTRAMUSCULAR | Status: AC
  Administered 2018-04-06: 2000 [IU] via SUBCUTANEOUS
  Filled 2018-04-06: qty 1

## 2018-04-06 MED ORDER — EPOETIN ALFA 10000 UNIT/ML IJ SOLN: 5000.0000 [IU] | INTRAMUSCULAR | Status: DC

## 2018-04-06 MED ORDER — EPOETIN ALFA 3000 UNIT/ML IJ SOLN
INTRAMUSCULAR | Status: AC
  Administered 2018-04-06: 3000 [IU] via SUBCUTANEOUS
  Filled 2018-04-06: qty 1

## 2018-04-13 ENCOUNTER — Ambulatory Visit (HOSPITAL_COMMUNITY)
Admission: RE | Admit: 2018-04-13 | Discharge: 2018-04-13 | Disposition: A | Discharge status: Home / Self Care | Payer: Medicare Other | Source: Ambulatory Visit | Attending: Nephrology | Admitting: Nephrology

## 2018-04-13 VITALS — BP 153/51 | HR 48 | Temp 97.5°F | Resp 20

## 2018-04-13 DIAGNOSIS — I130 Hypertensive heart and chronic kidney disease with heart failure and stage 1 through stage 4 chronic kidney disease, or unspecified chronic kidney disease: Secondary | ICD-10-CM | POA: Insufficient documentation

## 2018-04-13 MED ORDER — EPOETIN ALFA 10000 UNIT/ML IJ SOLN
INTRAMUSCULAR | Status: AC
  Administered 2018-04-13: 5000 [IU] via SUBCUTANEOUS
  Filled 2018-04-13: qty 1

## 2018-04-13 MED ORDER — EPOETIN ALFA 10000 UNIT/ML IJ SOLN
5000.0000 [IU] | INTRAMUSCULAR | Status: DC
  Administered 2018-04-13: 5000 [IU] via SUBCUTANEOUS

## 2018-04-14 ENCOUNTER — Other Ambulatory Visit (HOSPITAL_COMMUNITY): Payer: Self-pay | Admitting: Cardiology

## 2018-04-14 LAB — POCT HEMOGLOBIN-HEMACUE: Hemoglobin: 10.4 g/dL — ABNORMAL LOW (ref 13.0–17.0)

## 2018-04-20 ENCOUNTER — Ambulatory Visit (HOSPITAL_COMMUNITY)
Admission: RE | Admit: 2018-04-20 | Discharge: 2018-04-20 | Disposition: A | Discharge status: Home / Self Care | Payer: Medicare Other | Source: Ambulatory Visit | Attending: Nephrology | Admitting: Nephrology

## 2018-04-20 VITALS — BP 160/53 | HR 46 | Temp 97.8°F | Resp 20

## 2018-04-20 DIAGNOSIS — I130 Hypertensive heart and chronic kidney disease with heart failure and stage 1 through stage 4 chronic kidney disease, or unspecified chronic kidney disease: Secondary | ICD-10-CM | POA: Insufficient documentation

## 2018-04-20 LAB — POCT HEMOGLOBIN-HEMACUE: Hemoglobin: 11.1 g/dL — ABNORMAL LOW (ref 13.0–17.0)

## 2018-04-20 MED ORDER — EPOETIN ALFA 3000 UNIT/ML IJ SOLN
INTRAMUSCULAR | Status: AC
  Filled 2018-04-20: qty 1

## 2018-04-20 MED ORDER — EPOETIN ALFA 10000 UNIT/ML IJ SOLN
5000.0000 [IU] | INTRAMUSCULAR | Status: DC
  Administered 2018-04-20: 5000 [IU] via SUBCUTANEOUS

## 2018-04-20 MED ORDER — EPOETIN ALFA 2000 UNIT/ML IJ SOLN
INTRAMUSCULAR | Status: AC
  Filled 2018-04-20: qty 1

## 2018-04-21 MED FILL — Epoetin Alfa Inj 2000 Unit/ML: INTRAMUSCULAR | Qty: 1 | Status: AC

## 2018-04-21 MED FILL — Epoetin Alfa Inj 3000 Unit/ML: INTRAMUSCULAR | Qty: 1 | Status: AC

## 2018-04-27 ENCOUNTER — Ambulatory Visit (HOSPITAL_COMMUNITY)
Admission: RE | Admit: 2018-04-27 | Discharge: 2018-04-27 | Disposition: A | Discharge status: Home / Self Care | Payer: Medicare Other | Source: Ambulatory Visit | Attending: Nephrology | Admitting: Nephrology

## 2018-04-27 VITALS — BP 164/48 | HR 45 | Temp 97.7°F | Resp 20

## 2018-04-27 DIAGNOSIS — I130 Hypertensive heart and chronic kidney disease with heart failure and stage 1 through stage 4 chronic kidney disease, or unspecified chronic kidney disease: Secondary | ICD-10-CM | POA: Diagnosis not present

## 2018-04-27 LAB — RENAL FUNCTION PANEL
Albumin: 4.5 g/dL (ref 3.5–5.0)
Anion gap: 13 (ref 5–15)
BUN: 127 mg/dL — ABNORMAL HIGH (ref 8–23)
CO2: 26 mmol/L (ref 22–32)
Calcium: 10 mg/dL (ref 8.9–10.3)
Chloride: 98 mmol/L (ref 98–111)
Creatinine, Ser: 4.84 mg/dL — ABNORMAL HIGH (ref 0.61–1.24)
GFR calc Af Amer: 12 mL/min — ABNORMAL LOW (ref >60)
GFR calc non Af Amer: 10 mL/min — ABNORMAL LOW (ref >60)
Glucose, Bld: 129 mg/dL — ABNORMAL HIGH (ref 70–99)
POTASSIUM: 3.6 mmol/L (ref 3.5–5.1)
Phosphorus: 4.8 mg/dL — ABNORMAL HIGH (ref 2.5–4.6)
Sodium: 137 mmol/L (ref 135–145)

## 2018-04-27 LAB — IRON AND TIBC
Iron: 56 ug/dL (ref 45–182)
Saturation Ratios: 20 % (ref 17.9–39.5)
TIBC: 284 ug/dL (ref 250–450)
UIBC: 228 ug/dL

## 2018-04-27 LAB — POCT HEMOGLOBIN-HEMACUE: Hemoglobin: 10.4 g/dL — ABNORMAL LOW (ref 13.0–17.0)

## 2018-04-27 LAB — FERRITIN: Ferritin: 182 ng/mL (ref 24–336)

## 2018-04-27 MED ORDER — EPOETIN ALFA 10000 UNIT/ML IJ SOLN: 5000.0000 [IU] | INTRAMUSCULAR | Status: DC

## 2018-04-27 MED ORDER — EPOETIN ALFA 3000 UNIT/ML IJ SOLN
INTRAMUSCULAR | Status: AC
  Administered 2018-04-27: 3000 [IU]
  Filled 2018-04-27: qty 1

## 2018-04-27 MED ORDER — EPOETIN ALFA 2000 UNIT/ML IJ SOLN
INTRAMUSCULAR | Status: AC
  Administered 2018-04-27: 2000 [IU]
  Filled 2018-04-27: qty 1

## 2018-05-04 ENCOUNTER — Ambulatory Visit (HOSPITAL_COMMUNITY)
Admission: RE | Admit: 2018-05-04 | Discharge: 2018-05-04 | Disposition: A | Discharge status: Home / Self Care | Payer: Medicare Other | Source: Ambulatory Visit | Attending: Nephrology | Admitting: Nephrology

## 2018-05-04 VITALS — BP 177/47 | HR 40 | Temp 97.8°F | Resp 20

## 2018-05-04 DIAGNOSIS — I130 Hypertensive heart and chronic kidney disease with heart failure and stage 1 through stage 4 chronic kidney disease, or unspecified chronic kidney disease: Secondary | ICD-10-CM | POA: Diagnosis not present

## 2018-05-04 LAB — POCT HEMOGLOBIN-HEMACUE: Hemoglobin: 10.5 g/dL — ABNORMAL LOW (ref 13.0–17.0)

## 2018-05-04 MED ORDER — EPOETIN ALFA 2000 UNIT/ML IJ SOLN
INTRAMUSCULAR | Status: AC
  Administered 2018-05-04: 2000 [IU]
  Filled 2018-05-04: qty 1

## 2018-05-04 MED ORDER — EPOETIN ALFA 10000 UNIT/ML IJ SOLN: 5000.0000 [IU] | INTRAMUSCULAR | Status: DC

## 2018-05-04 MED ORDER — EPOETIN ALFA 3000 UNIT/ML IJ SOLN
INTRAMUSCULAR | Status: AC
  Administered 2018-05-04: 3000 [IU]
  Filled 2018-05-04: qty 1

## 2018-05-11 ENCOUNTER — Encounter (HOSPITAL_COMMUNITY)
Admission: RE | Admit: 2018-05-11 | Discharge: 2018-05-11 | Disposition: A | Discharge status: Home / Self Care | Payer: Medicare Other | Source: Ambulatory Visit | Attending: Nephrology | Admitting: Nephrology

## 2018-05-11 VITALS — BP 176/51 | HR 44 | Temp 98.0°F | Resp 20

## 2018-05-11 DIAGNOSIS — I130 Hypertensive heart and chronic kidney disease with heart failure and stage 1 through stage 4 chronic kidney disease, or unspecified chronic kidney disease: Secondary | ICD-10-CM | POA: Diagnosis not present

## 2018-05-11 LAB — POCT HEMOGLOBIN-HEMACUE: Hemoglobin: 10.9 g/dL — ABNORMAL LOW (ref 13.0–17.0)

## 2018-05-11 MED ORDER — EPOETIN ALFA 10000 UNIT/ML IJ SOLN: 5000.0000 [IU] | INTRAMUSCULAR | Status: DC

## 2018-05-11 MED ORDER — EPOETIN ALFA 3000 UNIT/ML IJ SOLN
INTRAMUSCULAR | Status: AC
  Administered 2018-05-11: 3000 [IU] via SUBCUTANEOUS
  Filled 2018-05-11: qty 1

## 2018-05-11 MED ORDER — EPOETIN ALFA 2000 UNIT/ML IJ SOLN
INTRAMUSCULAR | Status: AC
  Administered 2018-05-11: 2000 [IU] via SUBCUTANEOUS
  Filled 2018-05-11: qty 1

## 2018-05-18 ENCOUNTER — Ambulatory Visit (HOSPITAL_COMMUNITY)
Admission: RE | Admit: 2018-05-18 | Discharge: 2018-05-18 | Disposition: A | Discharge status: Home / Self Care | Payer: Medicare Other | Source: Ambulatory Visit | Attending: Nephrology | Admitting: Nephrology

## 2018-05-18 VITALS — BP 146/46 | HR 42 | Temp 97.7°F | Resp 20

## 2018-05-18 DIAGNOSIS — R410 Disorientation, unspecified: Secondary | ICD-10-CM | POA: Diagnosis not present

## 2018-05-18 DIAGNOSIS — E890 Postprocedural hypothyroidism: Secondary | ICD-10-CM | POA: Diagnosis not present

## 2018-05-18 DIAGNOSIS — K219 Gastro-esophageal reflux disease without esophagitis: Secondary | ICD-10-CM | POA: Diagnosis not present

## 2018-05-18 DIAGNOSIS — Z23 Encounter for immunization: Secondary | ICD-10-CM | POA: Diagnosis not present

## 2018-05-18 DIAGNOSIS — N2581 Secondary hyperparathyroidism of renal origin: Secondary | ICD-10-CM | POA: Diagnosis not present

## 2018-05-18 DIAGNOSIS — R6883 Chills (without fever): Secondary | ICD-10-CM | POA: Diagnosis not present

## 2018-05-18 DIAGNOSIS — I129 Hypertensive chronic kidney disease with stage 1 through stage 4 chronic kidney disease, or unspecified chronic kidney disease: Secondary | ICD-10-CM | POA: Diagnosis not present

## 2018-05-18 DIAGNOSIS — I340 Nonrheumatic mitral (valve) insufficiency: Secondary | ICD-10-CM | POA: Diagnosis not present

## 2018-05-18 DIAGNOSIS — I130 Hypertensive heart and chronic kidney disease with heart failure and stage 1 through stage 4 chronic kidney disease, or unspecified chronic kidney disease: Secondary | ICD-10-CM

## 2018-05-18 DIAGNOSIS — Z9889 Other specified postprocedural states: Secondary | ICD-10-CM | POA: Diagnosis not present

## 2018-05-18 DIAGNOSIS — N400 Enlarged prostate without lower urinary tract symptoms: Secondary | ICD-10-CM | POA: Diagnosis not present

## 2018-05-18 DIAGNOSIS — E1129 Type 2 diabetes mellitus with other diabetic kidney complication: Secondary | ICD-10-CM | POA: Diagnosis not present

## 2018-05-18 DIAGNOSIS — I110 Hypertensive heart disease with heart failure: Secondary | ICD-10-CM | POA: Diagnosis not present

## 2018-05-18 DIAGNOSIS — Z1389 Encounter for screening for other disorder: Secondary | ICD-10-CM | POA: Diagnosis not present

## 2018-05-18 DIAGNOSIS — Z8719 Personal history of other diseases of the digestive system: Secondary | ICD-10-CM | POA: Diagnosis not present

## 2018-05-18 DIAGNOSIS — E785 Hyperlipidemia, unspecified: Secondary | ICD-10-CM | POA: Diagnosis not present

## 2018-05-18 DIAGNOSIS — N184 Chronic kidney disease, stage 4 (severe): Secondary | ICD-10-CM | POA: Diagnosis not present

## 2018-05-18 DIAGNOSIS — R4182 Altered mental status, unspecified: Secondary | ICD-10-CM | POA: Diagnosis not present

## 2018-05-18 DIAGNOSIS — I509 Heart failure, unspecified: Secondary | ICD-10-CM | POA: Diagnosis not present

## 2018-05-18 DIAGNOSIS — G9341 Metabolic encephalopathy: Secondary | ICD-10-CM | POA: Diagnosis not present

## 2018-05-18 DIAGNOSIS — Z0000 Encounter for general adult medical examination without abnormal findings: Secondary | ICD-10-CM | POA: Diagnosis not present

## 2018-05-18 DIAGNOSIS — I272 Pulmonary hypertension, unspecified: Secondary | ICD-10-CM | POA: Diagnosis not present

## 2018-05-18 DIAGNOSIS — R0989 Other specified symptoms and signs involving the circulatory and respiratory systems: Secondary | ICD-10-CM | POA: Diagnosis not present

## 2018-05-18 DIAGNOSIS — I2510 Atherosclerotic heart disease of native coronary artery without angina pectoris: Secondary | ICD-10-CM | POA: Diagnosis not present

## 2018-05-18 DIAGNOSIS — D509 Iron deficiency anemia, unspecified: Secondary | ICD-10-CM | POA: Diagnosis not present

## 2018-05-18 DIAGNOSIS — I5030 Unspecified diastolic (congestive) heart failure: Secondary | ICD-10-CM | POA: Diagnosis not present

## 2018-05-18 DIAGNOSIS — I10 Essential (primary) hypertension: Secondary | ICD-10-CM | POA: Diagnosis not present

## 2018-05-18 DIAGNOSIS — R413 Other amnesia: Secondary | ICD-10-CM | POA: Diagnosis not present

## 2018-05-18 DIAGNOSIS — J449 Chronic obstructive pulmonary disease, unspecified: Secondary | ICD-10-CM | POA: Diagnosis not present

## 2018-05-18 DIAGNOSIS — D631 Anemia in chronic kidney disease: Secondary | ICD-10-CM | POA: Diagnosis not present

## 2018-05-18 MED ORDER — EPOETIN ALFA 2000 UNIT/ML IJ SOLN
INTRAMUSCULAR | Status: AC
  Administered 2018-05-18: 2000 [IU]
  Filled 2018-05-18: qty 1

## 2018-05-18 MED ORDER — EPOETIN ALFA 3000 UNIT/ML IJ SOLN
INTRAMUSCULAR | Status: AC
  Administered 2018-05-18: 3000 [IU]
  Filled 2018-05-18: qty 1

## 2018-05-18 MED ORDER — EPOETIN ALFA 10000 UNIT/ML IJ SOLN: 5000.0000 [IU] | INTRAMUSCULAR | Status: DC

## 2018-05-19 LAB — POCT HEMOGLOBIN-HEMACUE: Hemoglobin: 9.9 g/dL — ABNORMAL LOW (ref 13.0–17.0)

## 2018-05-20 ENCOUNTER — Encounter (HOSPITAL_COMMUNITY): Payer: Self-pay

## 2018-05-20 ENCOUNTER — Inpatient Hospital Stay (HOSPITAL_COMMUNITY)
Admission: EM | Admit: 2018-05-20 | Discharge: 2018-05-23 | DRG: 070 | Disposition: A | Discharge status: Home / Self Care | Payer: Medicare Other | Attending: Internal Medicine | Admitting: Internal Medicine

## 2018-05-20 ENCOUNTER — Emergency Department (HOSPITAL_COMMUNITY): Payer: Medicare Other

## 2018-05-20 ENCOUNTER — Other Ambulatory Visit: Payer: Self-pay

## 2018-05-20 DIAGNOSIS — G9340 Encephalopathy, unspecified: Secondary | ICD-10-CM | POA: Diagnosis not present

## 2018-05-20 DIAGNOSIS — J9601 Acute respiratory failure with hypoxia: Secondary | ICD-10-CM | POA: Diagnosis present

## 2018-05-20 DIAGNOSIS — F0151 Vascular dementia with behavioral disturbance: Secondary | ICD-10-CM | POA: Diagnosis present

## 2018-05-20 DIAGNOSIS — E785 Hyperlipidemia, unspecified: Secondary | ICD-10-CM | POA: Diagnosis present

## 2018-05-20 DIAGNOSIS — F1021 Alcohol dependence, in remission: Secondary | ICD-10-CM | POA: Diagnosis present

## 2018-05-20 DIAGNOSIS — F01518 Vascular dementia, unspecified severity, with other behavioral disturbance: Secondary | ICD-10-CM | POA: Diagnosis present

## 2018-05-20 DIAGNOSIS — G9341 Metabolic encephalopathy: Principal | ICD-10-CM | POA: Diagnosis present

## 2018-05-20 DIAGNOSIS — R4182 Altered mental status, unspecified: Secondary | ICD-10-CM | POA: Diagnosis not present

## 2018-05-20 DIAGNOSIS — I6523 Occlusion and stenosis of bilateral carotid arteries: Secondary | ICD-10-CM | POA: Diagnosis present

## 2018-05-20 DIAGNOSIS — I509 Heart failure, unspecified: Secondary | ICD-10-CM | POA: Diagnosis not present

## 2018-05-20 DIAGNOSIS — N185 Chronic kidney disease, stage 5: Secondary | ICD-10-CM | POA: Diagnosis present

## 2018-05-20 DIAGNOSIS — I5033 Acute on chronic diastolic (congestive) heart failure: Secondary | ICD-10-CM | POA: Diagnosis present

## 2018-05-20 DIAGNOSIS — H409 Unspecified glaucoma: Secondary | ICD-10-CM | POA: Diagnosis present

## 2018-05-20 DIAGNOSIS — I272 Pulmonary hypertension, unspecified: Secondary | ICD-10-CM | POA: Diagnosis present

## 2018-05-20 DIAGNOSIS — Z79890 Hormone replacement therapy (postmenopausal): Secondary | ICD-10-CM

## 2018-05-20 DIAGNOSIS — Z91041 Radiographic dye allergy status: Secondary | ICD-10-CM

## 2018-05-20 DIAGNOSIS — R410 Disorientation, unspecified: Secondary | ICD-10-CM | POA: Diagnosis not present

## 2018-05-20 DIAGNOSIS — J069 Acute upper respiratory infection, unspecified: Secondary | ICD-10-CM | POA: Diagnosis present

## 2018-05-20 DIAGNOSIS — Z8711 Personal history of peptic ulcer disease: Secondary | ICD-10-CM

## 2018-05-20 DIAGNOSIS — I132 Hypertensive heart and chronic kidney disease with heart failure and with stage 5 chronic kidney disease, or end stage renal disease: Secondary | ICD-10-CM | POA: Diagnosis present

## 2018-05-20 DIAGNOSIS — Z8249 Family history of ischemic heart disease and other diseases of the circulatory system: Secondary | ICD-10-CM

## 2018-05-20 DIAGNOSIS — Z888 Allergy status to other drugs, medicaments and biological substances status: Secondary | ICD-10-CM

## 2018-05-20 DIAGNOSIS — I160 Hypertensive urgency: Secondary | ICD-10-CM | POA: Diagnosis present

## 2018-05-20 DIAGNOSIS — E039 Hypothyroidism, unspecified: Secondary | ICD-10-CM | POA: Diagnosis not present

## 2018-05-20 DIAGNOSIS — R6883 Chills (without fever): Secondary | ICD-10-CM | POA: Diagnosis not present

## 2018-05-20 DIAGNOSIS — Z79899 Other long term (current) drug therapy: Secondary | ICD-10-CM

## 2018-05-20 DIAGNOSIS — I2510 Atherosclerotic heart disease of native coronary artery without angina pectoris: Secondary | ICD-10-CM | POA: Diagnosis present

## 2018-05-20 DIAGNOSIS — Z841 Family history of disorders of kidney and ureter: Secondary | ICD-10-CM

## 2018-05-20 DIAGNOSIS — D631 Anemia in chronic kidney disease: Secondary | ICD-10-CM | POA: Diagnosis present

## 2018-05-20 DIAGNOSIS — J988 Other specified respiratory disorders: Secondary | ICD-10-CM | POA: Diagnosis present

## 2018-05-20 DIAGNOSIS — N400 Enlarged prostate without lower urinary tract symptoms: Secondary | ICD-10-CM | POA: Diagnosis present

## 2018-05-20 DIAGNOSIS — I340 Nonrheumatic mitral (valve) insufficiency: Secondary | ICD-10-CM | POA: Diagnosis present

## 2018-05-20 DIAGNOSIS — G4733 Obstructive sleep apnea (adult) (pediatric): Secondary | ICD-10-CM | POA: Diagnosis present

## 2018-05-20 DIAGNOSIS — Z7982 Long term (current) use of aspirin: Secondary | ICD-10-CM

## 2018-05-20 DIAGNOSIS — I161 Hypertensive emergency: Secondary | ICD-10-CM | POA: Diagnosis present

## 2018-05-20 DIAGNOSIS — I110 Hypertensive heart disease with heart failure: Secondary | ICD-10-CM | POA: Diagnosis not present

## 2018-05-20 DIAGNOSIS — Z87891 Personal history of nicotine dependence: Secondary | ICD-10-CM

## 2018-05-20 DIAGNOSIS — Z800 Family history of malignant neoplasm of digestive organs: Secondary | ICD-10-CM

## 2018-05-20 DIAGNOSIS — Z5320 Procedure and treatment not carried out because of patient's decision for unspecified reasons: Secondary | ICD-10-CM | POA: Diagnosis present

## 2018-05-20 LAB — COMPREHENSIVE METABOLIC PANEL
ALT: 14 U/L (ref 0–44)
AST: 17 U/L (ref 15–41)
Albumin: 4.1 g/dL (ref 3.5–5.0)
Alkaline Phosphatase: 43 U/L (ref 38–126)
Anion gap: 11 (ref 5–15)
BUN: 106 mg/dL — ABNORMAL HIGH (ref 8–23)
CO2: 20 mmol/L — ABNORMAL LOW (ref 22–32)
CREATININE: 4.84 mg/dL — AB (ref 0.61–1.24)
Calcium: 9.5 mg/dL (ref 8.9–10.3)
Chloride: 109 mmol/L (ref 98–111)
GFR calc Af Amer: 12 mL/min — ABNORMAL LOW (ref >60)
GFR calc non Af Amer: 10 mL/min — ABNORMAL LOW (ref >60)
Glucose, Bld: 127 mg/dL — ABNORMAL HIGH (ref 70–99)
Potassium: 4.2 mmol/L (ref 3.5–5.1)
Sodium: 140 mmol/L (ref 135–145)
Total Bilirubin: 1.3 mg/dL — ABNORMAL HIGH (ref 0.3–1.2)
Total Protein: 7.2 g/dL (ref 6.5–8.1)

## 2018-05-20 LAB — URINALYSIS, ROUTINE W REFLEX MICROSCOPIC
Bacteria, UA: NONE SEEN
Bilirubin Urine: NEGATIVE
Glucose, UA: NEGATIVE mg/dL
Hgb urine dipstick: NEGATIVE
Ketones, ur: NEGATIVE mg/dL
Leukocytes,Ua: NEGATIVE
Nitrite: NEGATIVE
Protein, ur: 30 mg/dL — AB
Specific Gravity, Urine: 1.015 (ref 1.005–1.030)
pH: 5 (ref 5.0–8.0)

## 2018-05-20 LAB — CBC WITH DIFFERENTIAL/PLATELET
Abs Immature Granulocytes: 0.06 10*3/uL (ref 0.00–0.07)
Basophils Absolute: 0 10*3/uL (ref 0.0–0.1)
Basophils Relative: 0 %
Eosinophils Absolute: 0 10*3/uL (ref 0.0–0.5)
Eosinophils Relative: 0 %
HCT: 30 % — ABNORMAL LOW (ref 39.0–52.0)
Hemoglobin: 9.4 g/dL — ABNORMAL LOW (ref 13.0–17.0)
Immature Granulocytes: 1 %
Lymphocytes Relative: 8 %
Lymphs Abs: 0.7 10*3/uL (ref 0.7–4.0)
MCH: 31 pg (ref 26.0–34.0)
MCHC: 31.3 g/dL (ref 30.0–36.0)
MCV: 99 fL (ref 80.0–100.0)
Monocytes Absolute: 0.7 10*3/uL (ref 0.1–1.0)
Monocytes Relative: 8 %
Neutro Abs: 7.3 10*3/uL (ref 1.7–7.7)
Neutrophils Relative %: 83 %
Platelets: 122 10*3/uL — ABNORMAL LOW (ref 150–400)
RBC: 3.03 MIL/uL — ABNORMAL LOW (ref 4.22–5.81)
RDW: 15.4 % (ref 11.5–15.5)
WBC: 8.8 10*3/uL (ref 4.0–10.5)
nRBC: 0 % (ref 0.0–0.2)

## 2018-05-20 LAB — TSH: TSH: 1.029 u[IU]/mL (ref 0.350–4.500)

## 2018-05-20 LAB — BRAIN NATRIURETIC PEPTIDE: B Natriuretic Peptide: 3913.9 pg/mL — ABNORMAL HIGH (ref 0.0–100.0)

## 2018-05-20 LAB — I-STAT TROPONIN, ED: Troponin i, poc: 0.07 ng/mL (ref 0.00–0.08)

## 2018-05-20 LAB — INFLUENZA PANEL BY PCR (TYPE A & B)
Influenza A By PCR: NEGATIVE
Influenza B By PCR: NEGATIVE

## 2018-05-20 MED ORDER — SODIUM CHLORIDE 0.9 % IV SOLN: 250.0000 mL | INTRAVENOUS | Status: DC | PRN

## 2018-05-20 MED ORDER — HYDRALAZINE HCL 50 MG PO TABS
100.0000 mg | ORAL_TABLET | Freq: Three times a day (TID) | ORAL | Status: DC
  Administered 2018-05-20 – 2018-05-23 (×8): 100 mg via ORAL
  Filled 2018-05-20 (×8): qty 2

## 2018-05-20 MED ORDER — ATORVASTATIN CALCIUM 10 MG PO TABS
20.0000 mg | ORAL_TABLET | Freq: Every day | ORAL | Status: DC
  Administered 2018-05-21 – 2018-05-22 (×2): 20 mg via ORAL
  Filled 2018-05-20 (×2): qty 2

## 2018-05-20 MED ORDER — SODIUM CHLORIDE 0.9% FLUSH
3.0000 mL | Freq: Two times a day (BID) | INTRAVENOUS | Status: DC
  Administered 2018-05-20 – 2018-05-23 (×6): 3 mL via INTRAVENOUS

## 2018-05-20 MED ORDER — ONDANSETRON HCL 4 MG/2ML IJ SOLN: 4.0000 mg | Freq: Four times a day (QID) | INTRAMUSCULAR | Status: DC | PRN

## 2018-05-20 MED ORDER — BRINZOLAMIDE 1 % OP SUSP
1.0000 [drp] | Freq: Two times a day (BID) | OPHTHALMIC | Status: DC
  Administered 2018-05-21 – 2018-05-23 (×5): 1 [drp] via OPHTHALMIC
  Filled 2018-05-20: qty 10

## 2018-05-20 MED ORDER — LEVOTHYROXINE SODIUM 75 MCG PO TABS
150.0000 ug | ORAL_TABLET | Freq: Every day | ORAL | Status: DC
  Administered 2018-05-21 – 2018-05-23 (×3): 150 ug via ORAL
  Filled 2018-05-20 (×3): qty 2

## 2018-05-20 MED ORDER — CALCITRIOL 0.25 MCG PO CAPS
0.2500 ug | ORAL_CAPSULE | Freq: Every day | ORAL | Status: DC
  Administered 2018-05-21 – 2018-05-23 (×3): 0.25 ug via ORAL
  Filled 2018-05-20 (×3): qty 1

## 2018-05-20 MED ORDER — ENSURE ENLIVE PO LIQD
237.0000 mL | Freq: Two times a day (BID) | ORAL | Status: DC
  Administered 2018-05-21 – 2018-05-23 (×5): 237 mL via ORAL

## 2018-05-20 MED ORDER — FUROSEMIDE 10 MG/ML IJ SOLN
40.0000 mg | Freq: Once | INTRAMUSCULAR | Status: AC
  Administered 2018-05-20: 40 mg via INTRAVENOUS
  Filled 2018-05-20: qty 4

## 2018-05-20 MED ORDER — BRIMONIDINE TARTRATE 0.15 % OP SOLN
1.0000 [drp] | Freq: Two times a day (BID) | OPHTHALMIC | Status: DC
  Administered 2018-05-21 – 2018-05-23 (×5): 1 [drp] via OPHTHALMIC
  Filled 2018-05-20: qty 5

## 2018-05-20 MED ORDER — HYDRALAZINE HCL 20 MG/ML IJ SOLN: 10.0000 mg | INTRAMUSCULAR | Status: DC | PRN

## 2018-05-20 MED ORDER — AMLODIPINE BESYLATE 10 MG PO TABS
10.0000 mg | ORAL_TABLET | Freq: Every day | ORAL | Status: DC
  Administered 2018-05-21 – 2018-05-23 (×3): 10 mg via ORAL
  Filled 2018-05-20 (×3): qty 1

## 2018-05-20 MED ORDER — ACETAMINOPHEN 325 MG PO TABS
650.0000 mg | ORAL_TABLET | ORAL | Status: DC | PRN
  Administered 2018-05-21 – 2018-05-22 (×2): 650 mg via ORAL
  Filled 2018-05-20 (×2): qty 2

## 2018-05-20 MED ORDER — DOXAZOSIN MESYLATE 2 MG PO TABS
2.0000 mg | ORAL_TABLET | Freq: Every day | ORAL | Status: DC
  Administered 2018-05-21 – 2018-05-23 (×3): 2 mg via ORAL
  Filled 2018-05-20 (×3): qty 1

## 2018-05-20 MED ORDER — HEPARIN SODIUM (PORCINE) 5000 UNIT/ML IJ SOLN
5000.0000 [IU] | Freq: Three times a day (TID) | INTRAMUSCULAR | Status: DC
  Administered 2018-05-20 – 2018-05-23 (×8): 5000 [IU] via SUBCUTANEOUS
  Filled 2018-05-20 (×8): qty 1

## 2018-05-20 MED ORDER — FUROSEMIDE 10 MG/ML IJ SOLN
40.0000 mg | Freq: Two times a day (BID) | INTRAMUSCULAR | Status: DC
  Administered 2018-05-21 – 2018-05-23 (×5): 40 mg via INTRAVENOUS
  Filled 2018-05-20 (×5): qty 4

## 2018-05-20 MED ORDER — SODIUM CHLORIDE 0.9% FLUSH: 3.0000 mL | INTRAVENOUS | Status: DC | PRN

## 2018-05-20 MED ORDER — LOSARTAN POTASSIUM 50 MG PO TABS
50.0000 mg | ORAL_TABLET | Freq: Every day | ORAL | Status: DC
  Administered 2018-05-21 – 2018-05-23 (×3): 50 mg via ORAL
  Filled 2018-05-20 (×3): qty 1

## 2018-05-20 MED ORDER — ASPIRIN EC 81 MG PO TBEC
81.0000 mg | DELAYED_RELEASE_TABLET | Freq: Every day | ORAL | Status: DC
  Administered 2018-05-21 – 2018-05-23 (×3): 81 mg via ORAL
  Filled 2018-05-20 (×3): qty 1

## 2018-05-20 NOTE — ED Notes (Signed)
Patient transported to CT 

## 2018-05-20 NOTE — ED Provider Notes (Addendum)
[facility] EMERGENCY DEPARTMENT Provider Note   CSN: [id number] Arrival date & time: 05/20/18  1658    History   Chief Complaint Chief Complaint  Patient presents with  . Altered Mental Status  . Chills    HPI Jeffrey Campbell is a 85 y.o. male.     Patient is an 85 y/o male coming from home with PMH of dementia, CKD, CAD, anemia, graves disease, HTN, presenting to the ED with his wife and daughter for chills and AMS. Patient lives with wife who states he has been a little more confused the last few days. He thought today was Sunday. Reports he had a fall yesterday while trying to get into bed. Patient states he feels fine and does not know why he is in the hospital. Patients daughter, who does not live with him, states she came in to visit late last night and noticed the patient was shivering and kept saying he was cold. He got a flu shot yesterday. Denies n/v/d, fever, chest pain, SOB, syncope. Patient's daughter states he gets confused when he does not take his medication.  Patient is in charge of all this medication and states he has taken all of his medications today and did not miss a dose.  The daughter seems to feel that the patient may not have taken his medication.  Of note, the patient has a fistula in the left arm but has not yet started dialysis     Past Medical History:  Diagnosis Date  . Anemia    low iron  . BPH (benign prostatic hyperplasia)   . CAD (coronary artery disease)   . Carotid artery stenosis    1-39% bilateral carotid artery stenosis and < 50% stenosis in the right CCA by dopplers 06/2017  . CKD (chronic kidney disease), stage III (HCC)    Stage 4  . Diastolic dysfunction   . Glaucoma   . Graves disease   . Heart murmur, systolic   . History of ETOH abuse   . History of PFTs 05/2010   moderate airflow obstruction w reduced DLCO by PFT   . Hyperlipemia   . Hypertension   . Hyperthyroidism 08/26/10   radioactive iodine therapy   .  Mitral regurgitation echo 2015   mild  . Multiple thyroid nodules   . MVP (mitral valve prolapse) 11/2012   posterior MVP  . Organic impotence   . OSA (obstructive sleep apnea)    upper airway resistance syndrome with RDI 18/hr - not on CPAP due to insurance not covering  . Pre-diabetes   . PUD (peptic ulcer disease)   . Pulmonary hypertension ([place]) echo 2015   Group 2 with pulmonary venous HTN and Group 3 with OSA  . Shutdown renal    after cardiac cath  . Upper airway resistance syndrome     Patient Active Problem List   Diagnosis Date Noted  . Acute on chronic diastolic CHF (congestive heart failure) ([place]) 05/20/2018  . Acute encephalopathy 05/20/2018  . GI bleeding 10/15/2016  . GI bleed 10/15/2016  . Symptomatic anemia   . CKD (chronic kidney disease), stage V ([place])   . Hypertensive urgency   . Vascular dementia with behavioral disturbance ([place]) 09/24/2016  . Hypertensive encephalopathy   . Branch retinal vein occlusion of right eye 05/07/2016  . Graves' orbitopathy 05/07/2016  . Primary open angle glaucoma of left eye, mild stage 05/07/2016  . Primary open angle glaucoma of right eye, severe stage 05/07/2016  .  PAD (peripheral artery disease) ([place]) 04/20/2016  . Bilateral renal artery stenosis ([place]) 02/18/2015  . Pulmonary hypertension ([place]) 02/10/2015  . Chronic diastolic CHF (congestive heart failure) ([place]) 02/10/2015  . Renal bruit 01/07/2015  . Carotid artery stenosis   . Diastolic dysfunction   . Bradycardia 05/29/2013  . Glaucoma 04/09/2011  . COPD (chronic obstructive pulmonary disease) ([place]) 09/21/2010  . OSA (obstructive sleep apnea)   . Upper airway resistance syndrome   . Hypothyroidism   . Type 2 diabetes, diet controlled ([place])   . CAD (coronary artery disease)   . Hyperlipemia   . Hypertensive heart and chronic kidney disease with heart failure and stage 1 through stage 4 chronic kidney disease, or chronic kidney disease ([place])   . PUD (peptic ulcer  disease)     Past Surgical History:  Procedure Laterality Date  . APPENDECTOMY    . AV FISTULA PLACEMENT Left 10/18/2017   Procedure: ARTERIOVENOUS (AV) FISTULA CREATION ARM;  Surgeon: Waynetta Sandy, MD;  Location: [facility];  Service: Vascular;  Laterality: Left;  . CARDIAC CATHETERIZATION    . CARDIAC CATHETERIZATION N/A 02/17/2015   Procedure: Right Heart Cath;  Surgeon: Larey Dresser, MD;  Location: [facility];  Service: Cardiovascular;  Laterality: N/A;  . ESOPHAGOGASTRODUODENOSCOPY (EGD) WITH PROPOFOL Left 10/16/2016   Procedure: ESOPHAGOGASTRODUODENOSCOPY (EGD) WITH PROPOFOL;  Surgeon: Ronnette Juniper, MD;  Location: [facility];  Service: Gastroenterology;  Laterality: Left;  . heart catherization    . HERNIA REPAIR  10/2009  . IR RADIOLOGIST EVAL & MGMT  09/28/2016  . IR RADIOLOGIST EVAL & MGMT  10/19/2016  . IR RADIOLOGIST EVAL & MGMT  01/18/2017  . RIGHT HEART CATH N/A 07/28/2016   Procedure: Right Heart Cath;  Surgeon: Larey Dresser, MD;  Location: [facility];  Service: Cardiovascular;  Laterality: N/A;        Home Medications    Prior to Admission medications   Medication Sig Start Date End Date Taking? Authorizing Provider  amLODipine (NORVASC) 10 MG tablet Take 1 tablet (10 mg total) by mouth daily. 09/24/16 03/17/18  Dhungel, Flonnie Overman, MD  aspirin EC 81 MG tablet Take 81 mg by mouth daily.    [provider]  atorvastatin (LIPITOR) 20 MG tablet TAKE 1 TABLET BY MOUTH EVERY DAY Patient taking differently: Take 20 mg by mouth daily.  08/02/17   Sueanne Margarita, MD  Brinzolamide-Brimonidine [facility]) 1-0.2 % SUSP Place 1 drop into both eyes 2 (two) times daily.    [provider]  calcitRIOL (ROCALTROL) 0.25 MCG capsule Take 0.25 mcg by mouth daily. 02/24/18   [provider]  doxazosin (CARDURA) 2 MG tablet Take 2 mg by mouth daily. 04/27/18   [provider]  doxazosin (CARDURA) 4 MG tablet Take 4 mg by mouth 2 (two)  times daily.    [provider]  Epoetin Alfa (PROCRIT IJ) Inject 5,000 Units as directed every 7 (seven) days.    [provider]  feeding supplement, ENSURE ENLIVE, (ENSURE ENLIVE) LIQD Take 237 mLs by mouth 2 (two) times daily between meals. 09/24/16   Dhungel, Flonnie Overman, MD  ferrous sulfate 325 (65 FE) MG tablet Take 325 mg by mouth 2 (two) times daily with a meal.     [provider]  furosemide (LASIX) 40 MG tablet Take 40 mg by mouth 2 (two) times daily.    [provider]  hydrALAZINE (APRESOLINE) 100 MG tablet Take 1 tablet (100 mg total) by mouth  3 (three) times daily. Please make yearly appt with Dr. Radford Pax for October. 1st attempt 06/15/17   Sueanne Margarita, MD  levothyroxine (SYNTHROID, LEVOTHROID) 150 MCG tablet Take 150 mcg by mouth daily before breakfast.    [provider]  losartan (COZAAR) 25 MG tablet Take 25 mg by mouth at bedtime.     [provider]  losartan (COZAAR) 50 MG tablet Take 50 mg by mouth daily. 04/26/18   [provider]  Multiple Vitamin (MULTIVITAMIN WITH MINERALS) TABS tablet Take 1 tablet by mouth daily.    [provider]  potassium chloride (K-DUR) 10 MEQ tablet TAKE 1 TABLET BY MOUTH EVERY DAY 04/14/18   Larey Dresser, MD  potassium chloride (KLOR-CON M10) 10 MEQ tablet Take 1 tablet (10 mEq total) by mouth daily. 11/03/16   Larey Dresser, MD    Family History Family History  Problem Relation Age of Onset  . Kidney failure Father   . Hypertension Father   . Colon cancer Mother   . Colon cancer Brother   . Lung disease Brother   . Colon cancer Maternal Uncle     Social History Social History   Tobacco Use  . Smoking status: Former Smoker    Packs/day: 2.00    Years: 40.00    Pack years: 80.00    Types: Cigarettes    Last attempt to quit: 03/29/1984    Years since quitting: 34.1  . Smokeless tobacco: Never Used  Substance Use Topics  . Alcohol use: No    Comment: quit in  1981  . Drug use: No     Allergies   Iodinated diagnostic agents; Zocor [simvastatin]; Budesonide-formoterol fumarate; Minoxidil; Tiotropium bromide monohydrate; and Tricor [fenofibrate]   Review of Systems Review of Systems  Constitutional: Negative.  Negative for chills and fever.  HENT: Negative for ear pain and sore throat.   Eyes: Negative for pain and visual disturbance.  Respiratory: Negative for cough and shortness of breath.   Cardiovascular: Negative for chest pain and palpitations.  Gastrointestinal: Negative for abdominal pain and vomiting.  Genitourinary: Negative for dysuria and hematuria.  Musculoskeletal: Negative for arthralgias and back pain.  Skin: Negative for color change and rash.  Neurological: Negative for seizures and syncope.  All other systems reviewed and are negative.    Physical Exam Updated Vital Signs BP (!) 192/73   Pulse (!) 57   Temp 99.3 F (37.4 C) (Rectal)   Resp (!) 27   Ht 5' 8.5" (1.74 m)   Wt 63.5 kg   SpO2 93%   BMI 20.98 kg/m   Physical Exam Vitals signs and nursing note reviewed.  Constitutional:      General: He is not in acute distress.    Appearance: He is normal weight. He is not ill-appearing, toxic-appearing or diaphoretic.     Comments: Pleasantly confused elderly male  HENT:     Head: Normocephalic and atraumatic.     Right Ear: Tympanic membrane normal.     Left Ear: Tympanic membrane normal.     Nose: Nose normal.     Mouth/Throat:     Mouth: Mucous membranes are moist.  Eyes:     Conjunctiva/sclera: Conjunctivae normal.     Pupils: Pupils are equal, round, and reactive to light.  Cardiovascular:     Rate and Rhythm: Normal rate and regular rhythm.     Heart sounds: Murmur present.  Pulmonary:     Effort: Pulmonary effort is normal.  Breath sounds: Wheezing (Mild scattered expiratory wheeze) present. No rhonchi or rales.  Abdominal:     General: Abdomen is flat. Bowel sounds are normal.      Palpations: Abdomen is soft.  Musculoskeletal:     Right lower leg: No edema.     Left lower leg: No edema.  Skin:    General: Skin is warm.     Capillary Refill: Capillary refill takes less than 2 seconds.  Neurological:     General: No focal deficit present.     Mental Status: He is alert.  Psychiatric:        Mood and Affect: Mood normal.      ED Treatments / Results  Labs (all labs ordered are listed, but only abnormal results are displayed) Labs Reviewed  COMPREHENSIVE METABOLIC PANEL - Abnormal; Notable for the following components:      Result Value   CO2 20 (*)    Glucose, Bld 127 (*)    BUN 106 (*)    Creatinine, Ser 4.84 (*)    Total Bilirubin 1.3 (*)    GFR calc non Af Amer 10 (*)    GFR calc Af Amer 12 (*)    All other components within normal limits  CBC WITH DIFFERENTIAL/PLATELET - Abnormal; Notable for the following components:   RBC 3.03 (*)    Hemoglobin 9.4 (*)    HCT 30.0 (*)    Platelets 122 (*)    All other components within normal limits  URINALYSIS, ROUTINE W REFLEX MICROSCOPIC - Abnormal; Notable for the following components:   Protein, ur 30 (*)    All other components within normal limits  BRAIN NATRIURETIC PEPTIDE - Abnormal; Notable for the following components:   B Natriuretic Peptide 3,913.9 (*)    All other components within normal limits  TSH  BASIC METABOLIC PANEL  AMMONIA  VITAMIN B12  FOLATE RBC  RPR  HIV ANTIBODY (ROUTINE TESTING W REFLEX)  I-STAT TROPONIN, ED    EKG EKG Interpretation  Date/Time:  Saturday May 20 2018 17:37:33 EST Ventricular Rate:  56 PR Interval:    QRS Duration: 121 QT Interval:  483 QTC Calculation: 467 R Axis:   52 Text Interpretation:  Sinus or ectopic atrial rhythm Prolonged PR interval Left ventricular hypertrophy Nonspecific T abnormalities, lateral leads T wave changes similar to July 2018, a little different from Aug 2018 Confirmed by Sherwood Gambler [id number]) on 05/20/2018 5:42:33  PM   Radiology Dg Chest 2 View  Result Date: 05/20/2018 CLINICAL DATA:  Chills. Altered mental status. EXAM: CHEST - 2 VIEW COMPARISON:  09/20/2016 FINDINGS: Enlarged cardiac silhouette. Calcific atherosclerotic disease of the aorta. Mild pulmonary vascular congestion. Probable bilateral small pleural effusions. Osseous structures are without acute abnormality. Soft tissues are grossly normal. IMPRESSION: 1. Enlarged cardiac silhouette with mild pulmonary vascular congestion. 2. Probable bilateral small pleural effusions. Electronically Signed   By: Fidela Salisbury M.D.   On: 05/20/2018 18:34   Ct Head Wo Contrast  Result Date: 05/20/2018 CLINICAL DATA:  Chills. Confusion. EXAM: CT HEAD WITHOUT CONTRAST TECHNIQUE: Contiguous axial images were obtained from the base of the skull through the vertex without intravenous contrast. COMPARISON:  Brain MRI 09/12/2016 FINDINGS: Brain: No evidence of acute infarction, hemorrhage, hydrocephalus, extra-axial collection or mass lesion/mass effect. Moderate brain parenchymal volume loss and deep white matter microangiopathy. Vascular: Calcific atherosclerotic disease at the skull base. Skull: Normal. Negative for fracture or focal lesion. Sinuses/Orbits: Polypoid mucosal thickening of the bilateral maxillary sinuses Other: None.  IMPRESSION: 1. No acute intracranial abnormality. 2. Moderate brain parenchymal atrophy and chronic microvascular disease. 3. Chronic bilateral maxillary sinusitis. Electronically Signed   By: Fidela Salisbury M.D.   On: 05/20/2018 18:38    Procedures Procedures (including critical care time)  Medications Ordered in ED Medications  furosemide (LASIX) injection 40 mg (has no administration in time range)  aspirin EC tablet 81 mg (has no administration in time range)  amLODipine (NORVASC) tablet 10 mg (has no administration in time range)  atorvastatin (LIPITOR) tablet 20 mg (has no administration in time range)  doxazosin  (CARDURA) tablet 2 mg (has no administration in time range)  hydrALAZINE (APRESOLINE) tablet 100 mg (has no administration in time range)  losartan (COZAAR) tablet 50 mg (has no administration in time range)  calcitRIOL (ROCALTROL) capsule 0.25 mcg (has no administration in time range)  levothyroxine (SYNTHROID, LEVOTHROID) tablet 150 mcg (has no administration in time range)  feeding supplement (ENSURE ENLIVE) (ENSURE ENLIVE) liquid 237 mL (has no administration in time range)  brinzolamide (AZOPT) 1 % ophthalmic suspension 1 drop (has no administration in time range)  brimonidine (ALPHAGAN) 0.15 % ophthalmic solution 1 drop (has no administration in time range)  sodium chloride flush (NS) 0.9 % injection 3 mL (has no administration in time range)  sodium chloride flush (NS) 0.9 % injection 3 mL (has no administration in time range)  0.9 %  sodium chloride infusion (has no administration in time range)  acetaminophen (TYLENOL) tablet 650 mg (has no administration in time range)  ondansetron (ZOFRAN) injection 4 mg (has no administration in time range)  heparin injection 5,000 Units (has no administration in time range)  furosemide (LASIX) injection 40 mg (has no administration in time range)  hydrALAZINE (APRESOLINE) injection 10 mg (has no administration in time range)  furosemide (LASIX) injection 40 mg (40 mg Intravenous Given 05/20/18 1938)     Initial Impression / Assessment and Plan / ED Course  I have reviewed the triage vital signs and the nursing notes.  Pertinent labs & imaging results that were available during my care of the patient were reviewed by me and considered in my medical decision making (see chart for details).  Clinical Course as of May 20 2208  Sat May 20, 2018  2208 Workup reveals pulmonary vascular congestion with BNP of 3913 and normal troponin. Patient was diuresed with IV lasix 40mg  with small urine output. He desatted to 84% on RA with ambulation and  hospitalist was consulted for admission. Patient resting comfortably at this time   [KM]    Clinical Course User Index [KM] Alveria Apley, PA-C         Clinical Impression: 1. Acute on chronic congestive heart failure, unspecified heart failure type ([place])   2. Altered mental status, unspecified altered mental status type     Disposition: ADMIT   CRITICAL CARE Performed by: Alveria Apley   Total critical care time: 45 minutes  Critical care time was exclusive of separately billable procedures and treating other patients.  Critical care was necessary to treat or prevent imminent or life-threatening deterioration.  Critical care was time spent personally by me on the following activities: development of treatment plan with patient and/or surrogate as well as nursing, discussions with consultants, evaluation of patient's response to treatment, examination of patient, obtaining history from patient or surrogate, ordering and performing treatments and interventions, ordering and review of laboratory studies, ordering and review of radiographic studies, pulse oximetry and re-evaluation of patient's  condition.   This note was prepared with assistance of Systems analyst. Occasional wrong-word or sound-a-like substitutions may have occurred due to the inherent limitations of voice recognition software.   Final Clinical Impressions(s) / ED Diagnoses   Final diagnoses:  Acute on chronic congestive heart failure, unspecified heart failure type ([place])  Altered mental status, unspecified altered mental status type    ED Discharge Orders    None       Kristine Royal 05/20/18 2210    Sherwood Gambler, MD 05/20/18 2337    Alveria Apley, PA-C 05/30/18 Eugenia Pancoast, MD 06/12/18 [phone]

## 2018-05-20 NOTE — H&P (Signed)
History and Physical    Jeffrey Campbell:[id number] DOB: 08/20/1932 DOA: 05/20/2018  PCP: [name], MD   Patient coming from: Home   Chief Complaint: Chills, rhinorrhea, increased confusion and fatigue   HPI: Jeffrey Campbell is a 85 y.o. male with medical history significant for chronic kidney disease stage V not yet on dialysis, coronary artery disease, pulmonary hypertension, chronic diastolic CHF, hypertension, and vascular dementia, now presenting to the emergency department for evaluation of chills with increased confusion and increased fatigue. The patient is accompanied by his daughter and wife who assist with the history. He was in his usual state yesterday, was covered in blankets and complaining of chills earlier today, has been more fatigued than usual, and has been more confused than usual.  Patient does not have any complaints at this time, is holding tissues and wiping his nose frequently, and does acknowledge that his nose has been running.  His granddaughter has been suffering from an upper respiratory illness.  He has been coughing some today per family.  There has not been any recent fall or trauma.  Patient denies any melena or hematochezia.  He manages his own medications and is not certain whether he took his Lasix today or not, and also unable to say whether he took it yesterday either.  ED Course: Upon arrival to the ED, patient is found to be afebrile, saturating low 90s on room air, mildly tachypneic, slightly bradycardic, and with blood pressure 210/70.  EKG features a sinus or ectopic atrial rhythm with LVH and nonspecific T wave abnormality in the lateral leads.  Noncontrast head CT is negative for acute intracranial abnormality.  Chest x-ray is notable for enlarged cardiac silhouette with mild pulmonary vascular congestion.  Chemistry panel is notable for a BUN of 106 and creatinine of 4.84, similar to prior.  CBC features a slight worsening in his chronic normocytic  anemia with hemoglobin now 9.4.  Troponin is normal, urinalysis with 30 protein, and BNP elevated to 3900.  Patient was given 80 mg of IV Lasix in the ED, desaturated to the mid 80s with minimal exertion, and will be observed for further evaluation and acute on chronic diastolic CHF with increased confusion and increased fatigue.  Review of Systems:  All other systems reviewed and apart from HPI, are negative.  Past Medical History:  Diagnosis Date  . Anemia    low iron  . BPH (benign prostatic hyperplasia)   . CAD (coronary artery disease)   . Carotid artery stenosis    1-39% bilateral carotid artery stenosis and < 50% stenosis in the right CCA by dopplers 06/2017  . CKD (chronic kidney disease), stage III (HCC)    Stage 4  . Diastolic dysfunction   . Glaucoma   . Graves disease   . Heart murmur, systolic   . History of ETOH abuse   . History of PFTs 05/2010   moderate airflow obstruction w reduced DLCO by PFT   . Hyperlipemia   . Hypertension   . Hyperthyroidism 08/26/10   radioactive iodine therapy   . Mitral regurgitation echo 2015   mild  . Multiple thyroid nodules   . MVP (mitral valve prolapse) 11/2012   posterior MVP  . Organic impotence   . OSA (obstructive sleep apnea)    upper airway resistance syndrome with RDI 18/hr - not on CPAP due to insurance not covering  . Pre-diabetes   . PUD (peptic ulcer disease)   . Pulmonary hypertension ([place]) echo  2015   Group 2 with pulmonary venous HTN and Group 3 with OSA  . Shutdown renal    after cardiac cath  . Upper airway resistance syndrome     Past Surgical History:  Procedure Laterality Date  . APPENDECTOMY    . AV FISTULA PLACEMENT Left 10/18/2017   Procedure: ARTERIOVENOUS (AV) FISTULA CREATION ARM;  Surgeon: Waynetta Sandy, MD;  Location: [facility];  Service: Vascular;  Laterality: Left;  . CARDIAC CATHETERIZATION    . CARDIAC CATHETERIZATION N/A 02/17/2015   Procedure: Right Heart Cath;  Surgeon: Larey Dresser, MD;  Location: [facility];  Service: Cardiovascular;  Laterality: N/A;  . ESOPHAGOGASTRODUODENOSCOPY (EGD) WITH PROPOFOL Left 10/16/2016   Procedure: ESOPHAGOGASTRODUODENOSCOPY (EGD) WITH PROPOFOL;  Surgeon: Ronnette Juniper, MD;  Location: [facility];  Service: Gastroenterology;  Laterality: Left;  . heart catherization    . HERNIA REPAIR  10/2009  . IR RADIOLOGIST EVAL & MGMT  09/28/2016  . IR RADIOLOGIST EVAL & MGMT  10/19/2016  . IR RADIOLOGIST EVAL & MGMT  01/18/2017  . RIGHT HEART CATH N/A 07/28/2016   Procedure: Right Heart Cath;  Surgeon: Larey Dresser, MD;  Location: [facility];  Service: Cardiovascular;  Laterality: N/A;     reports that he quit smoking about 34 years ago. His smoking use included cigarettes. He has a 80.00 pack-year smoking history. He has never used smokeless tobacco. He reports that he does not drink alcohol or use drugs.  Allergies  Allergen Reactions  . Iodinated Diagnostic Agents Other (See Comments)    Right heart cath 07/2016 allegedly "shut down his kidneys" Patient has stage III chronic kidney disease  . Zocor [Simvastatin] Other (See Comments)    Liver problems   . Budesonide-Formoterol Fumarate Other (See Comments)    Dry mouth  . Minoxidil Other (See Comments)  . Tiotropium Bromide Monohydrate Other (See Comments)    Dry mouth  . Tricor [Fenofibrate]          Family History  Problem Relation Age of Onset  . Kidney failure Father   . Hypertension Father   . Colon cancer Mother   . Colon cancer Brother   . Lung disease Brother   . Colon cancer Maternal Uncle      Prior to Admission medications   Medication Sig Start Date End Date Taking? Authorizing Provider  amLODipine (NORVASC) 10 MG tablet Take 1 tablet (10 mg total) by mouth daily. 09/24/16 03/17/18  Dhungel, Flonnie Overman, MD  aspirin EC 81 MG tablet Take 81 mg by mouth daily.    [provider]  atorvastatin (LIPITOR) 20 MG tablet TAKE 1 TABLET BY MOUTH EVERY  DAY Patient taking differently: Take 20 mg by mouth daily.  08/02/17   Sueanne Margarita, MD  Brinzolamide-Brimonidine [facility]) 1-0.2 % SUSP Place 1 drop into both eyes 2 (two) times daily.    [provider]  calcitRIOL (ROCALTROL) 0.25 MCG capsule Take 0.25 mcg by mouth daily. 02/24/18   [provider]  doxazosin (CARDURA) 2 MG tablet Take 2 mg by mouth daily. 04/27/18   [provider]  doxazosin (CARDURA) 4 MG tablet Take 4 mg by mouth 2 (two) times daily.    [provider]  Epoetin Alfa (PROCRIT IJ) Inject 5,000 Units as directed every 7 (seven) days.    [provider]  feeding supplement, ENSURE ENLIVE, (ENSURE ENLIVE) LIQD Take 237 mLs by mouth 2 (two) times daily between meals. 09/24/16   Dhungel,  Nishant, MD  ferrous sulfate 325 (65 FE) MG tablet Take 325 mg by mouth 2 (two) times daily with a meal.     [provider]  furosemide (LASIX) 40 MG tablet Take 40 mg by mouth 2 (two) times daily.    [provider]  hydrALAZINE (APRESOLINE) 100 MG tablet Take 1 tablet (100 mg total) by mouth 3 (three) times daily. Please make yearly appt with Dr. Radford Pax for October. 1st attempt 06/15/17   Sueanne Margarita, MD  levothyroxine (SYNTHROID, LEVOTHROID) 150 MCG tablet Take 150 mcg by mouth daily before breakfast.    [provider]  losartan (COZAAR) 25 MG tablet Take 25 mg by mouth at bedtime.     [provider]  losartan (COZAAR) 50 MG tablet Take 50 mg by mouth daily. 04/26/18   [provider]  Multiple Vitamin (MULTIVITAMIN WITH MINERALS) TABS tablet Take 1 tablet by mouth daily.    [provider]  potassium chloride (K-DUR) 10 MEQ tablet TAKE 1 TABLET BY MOUTH EVERY DAY 04/14/18   Larey Dresser, MD  potassium chloride (KLOR-CON M10) 10 MEQ tablet Take 1 tablet (10 mEq total) by mouth daily. 11/03/16   Larey Dresser, MD    Physical Exam: Vitals:   05/20/18 1932 05/20/18 1945 05/20/18 2000  05/20/18 2004  BP:  (!) 195/71 (!) 192/73   Pulse:  (!) 57 (!) 57   Resp:  (!) 27 (!) 27   Temp: 99.3 F (37.4 C)     TempSrc: Rectal     SpO2:  92% 91% 93%  Weight:      Height:        Constitutional: NAD, calm  Eyes: PERTLA, lids and conjunctivae normal ENMT: Mucous membranes are moist. Posterior pharynx clear of any exudate or lesions.   Neck: normal, supple, no masses, no thyromegaly Respiratory: Mild tachypnea, coarse upper airways noise, no wheezing. No accessory muscle use.  Cardiovascular: S1 & S2 heard, regular rate and rhythm. No extremity edema.   Abdomen: No distension, no tenderness, soft. Bowel sounds normal.  Musculoskeletal: no clubbing / cyanosis. No joint deformity upper and lower extremities.    Skin: no significant rashes, lesions, ulcers. Warm, dry, well-perfused. Neurologic: no facial asymmetry. Sensation intact. Moving all extremities.  Psychiatric: Alert and oriented to person, place, and situation, not oriented to month or year. Calm, cooperative.    Labs on Admission: I have personally reviewed following labs and imaging studies  CBC: Recent Labs  Lab 05/18/18 0859 05/20/18 1750  WBC  --  8.8  NEUTROABS  --  7.3  HGB 9.9* 9.4*  HCT  --  30.0*  MCV  --  99.0  PLT  --  295*   Basic Metabolic Panel: Recent Labs  Lab 05/20/18 1750  NA 140  K 4.2  CL 109  CO2 20*  GLUCOSE 127*  BUN 106*  CREATININE 4.84*  CALCIUM 9.5   GFR: Estimated Creatinine Clearance: 10.4 mL/min (A) (by C-G formula based on SCr of 4.84 mg/dL (H)). Liver Function Tests: Recent Labs  Lab 05/20/18 1750  AST 17  ALT 14  ALKPHOS 43  BILITOT 1.3*  PROT 7.2  ALBUMIN 4.1   No results for input(s): LIPASE, AMYLASE in the last 168 hours. No results for input(s): AMMONIA in the last 168 hours. Coagulation Profile: No results for input(s): INR, PROTIME in the last 168 hours. Cardiac Enzymes: No results for input(s): CKTOTAL, CKMB, CKMBINDEX, TROPONINI in the last 168  hours.  BNP (last 3 results) Recent Labs    09/06/17 0850  PROBNP 2,000*   HbA1C: No results for input(s): HGBA1C in the last 72 hours. CBG: No results for input(s): GLUCAP in the last 168 hours. Lipid Profile: No results for input(s): CHOL, HDL, LDLCALC, TRIG, CHOLHDL, LDLDIRECT in the last 72 hours. Thyroid Function Tests: Recent Labs    05/20/18 1842  TSH 1.029   Anemia Panel: No results for input(s): VITAMINB12, FOLATE, FERRITIN, TIBC, IRON, RETICCTPCT in the last 72 hours. Urine analysis:    Component Value Date/Time   COLORURINE YELLOW 05/20/2018 1841   APPEARANCEUR CLEAR 05/20/2018 1841   LABSPEC 1.015 05/20/2018 1841   PHURINE 5.0 05/20/2018 1841   GLUCOSEU NEGATIVE 05/20/2018 1841   HGBUR NEGATIVE 05/20/2018 1841   BILIRUBINUR NEGATIVE 05/20/2018 [place] 05/20/2018 1841   PROTEINUR 30 (A) 05/20/2018 1841   NITRITE NEGATIVE 05/20/2018 1841   LEUKOCYTESUR NEGATIVE 05/20/2018 1841   Sepsis Labs: @LABRCNTIP (procalcitonin:4,lacticidven:4) )No results found for this or any previous visit (from the past 240 hour(s)).   Radiological Exams on Admission: Dg Chest 2 View  Result Date: 05/20/2018 CLINICAL DATA:  Chills. Altered mental status. EXAM: CHEST - 2 VIEW COMPARISON:  09/20/2016 FINDINGS: Enlarged cardiac silhouette. Calcific atherosclerotic disease of the aorta. Mild pulmonary vascular congestion. Probable bilateral small pleural effusions. Osseous structures are without acute abnormality. Soft tissues are grossly normal. IMPRESSION: 1. Enlarged cardiac silhouette with mild pulmonary vascular congestion. 2. Probable bilateral small pleural effusions. Electronically Signed   By: Fidela Salisbury M.D.   On: 05/20/2018 18:34   Ct Head Wo Contrast  Result Date: 05/20/2018 CLINICAL DATA:  Chills. Confusion. EXAM: CT HEAD WITHOUT CONTRAST TECHNIQUE: Contiguous axial images were obtained from the base of the skull through the vertex without  intravenous contrast. COMPARISON:  Brain MRI 09/12/2016 FINDINGS: Brain: No evidence of acute infarction, hemorrhage, hydrocephalus, extra-axial collection or mass lesion/mass effect. Moderate brain parenchymal volume loss and deep white matter microangiopathy. Vascular: Calcific atherosclerotic disease at the skull base. Skull: Normal. Negative for fracture or focal lesion. Sinuses/Orbits: Polypoid mucosal thickening of the bilateral maxillary sinuses Other: None. IMPRESSION: 1. No acute intracranial abnormality. 2. Moderate brain parenchymal atrophy and chronic microvascular disease. 3. Chronic bilateral maxillary sinusitis. Electronically Signed   By: Fidela Salisbury M.D.   On: 05/20/2018 18:38    EKG: Independently reviewed. Sinus or ectopic atrial rhythm, LVH, non-specific T-wave abnormality.   Assessment/Plan   1. Hypoxia with exertion; acute URI; acute on chronic diastolic CHF   - Presents with chills, rhinorrhea, increased confusion, and increased fatigue  - There is no peripheral edema, but BNP is 3,900 and there is vascular congestion on CXR  - He was treated with 80 mg IV Lasix in ED   - Check influenza PCR, culture if febrile, continue diuresis with Lasix 40 mg IV q12h, follow daily wt and I/O's    2. Acute encephalopathy  - Presents with chills, rhinorrhea, increased confusion and increased fatigue  - Head CT is negative for acute findings and TSH wnl in ED  - BUN is 106, but this is decreased from a month ago  - SBP was 210 initially, but this has normalized and remains slightly confused beyond baseline  - Possibly related to suspected viral URI, severe HTN, or uremia - Check ammonia, B12, folate, and RPR - Continue supportive care   3. Hypertensive urgency   - SBP 210 in ED, improved with Lasix  - Continue Norvasc, hydralazine, losartan, and  continue diuresis   4. CKD stage V  - SCr is 4.84 on admission, same as last month; BUN has decreased some  - He has AV graft in  LUE but has not yet been dialyzed  - Follow daily chem panel during IV diuresis    5. CAD - No anginal complaints  - Continue ASA, Lipitor, ARB    6. Hypothyroidism  - TSH normal in ED  - Continue Synthroid    DVT prophylaxis: sq heparin  Code Status: Full  Family Communication: Daughter and wife updated at bedside   Consults called: None Admission status: Observation     Vianne Bulls, MD [facility] Pager [phone]  If 7PM-7AM, please contact night-coverage [email or web address] Password [facility]  05/20/2018, 10:01 PM

## 2018-05-20 NOTE — ED Triage Notes (Signed)
Pt arrives POV with wife and family c/o "chills" and "has not been himself"; pt doesn't seem to know why he is here in the ED- just states that "he went to the clinic." Pt's family states pt was physically shaking because he was "cold"; family states pt gets confused when he doesn't take his medications. Family unsure if pt has been taking meds daily. VSS at this time.

## 2018-05-20 NOTE — ED Notes (Signed)
Verified with provider that he does want second dose of lasix now.

## 2018-05-20 NOTE — ED Notes (Signed)
ED Provider at bedside. 

## 2018-05-21 DIAGNOSIS — I132 Hypertensive heart and chronic kidney disease with heart failure and with stage 5 chronic kidney disease, or end stage renal disease: Secondary | ICD-10-CM | POA: Diagnosis present

## 2018-05-21 DIAGNOSIS — I2510 Atherosclerotic heart disease of native coronary artery without angina pectoris: Secondary | ICD-10-CM | POA: Diagnosis present

## 2018-05-21 DIAGNOSIS — Z8711 Personal history of peptic ulcer disease: Secondary | ICD-10-CM | POA: Diagnosis not present

## 2018-05-21 DIAGNOSIS — I340 Nonrheumatic mitral (valve) insufficiency: Secondary | ICD-10-CM | POA: Diagnosis present

## 2018-05-21 DIAGNOSIS — E785 Hyperlipidemia, unspecified: Secondary | ICD-10-CM | POA: Diagnosis present

## 2018-05-21 DIAGNOSIS — G9341 Metabolic encephalopathy: Secondary | ICD-10-CM | POA: Diagnosis present

## 2018-05-21 DIAGNOSIS — J069 Acute upper respiratory infection, unspecified: Secondary | ICD-10-CM | POA: Diagnosis present

## 2018-05-21 DIAGNOSIS — F0151 Vascular dementia with behavioral disturbance: Secondary | ICD-10-CM | POA: Diagnosis present

## 2018-05-21 DIAGNOSIS — Z91041 Radiographic dye allergy status: Secondary | ICD-10-CM | POA: Diagnosis not present

## 2018-05-21 DIAGNOSIS — G9340 Encephalopathy, unspecified: Secondary | ICD-10-CM | POA: Diagnosis not present

## 2018-05-21 DIAGNOSIS — R4182 Altered mental status, unspecified: Secondary | ICD-10-CM | POA: Diagnosis present

## 2018-05-21 DIAGNOSIS — Z87891 Personal history of nicotine dependence: Secondary | ICD-10-CM | POA: Diagnosis not present

## 2018-05-21 DIAGNOSIS — G4733 Obstructive sleep apnea (adult) (pediatric): Secondary | ICD-10-CM | POA: Diagnosis present

## 2018-05-21 DIAGNOSIS — I160 Hypertensive urgency: Secondary | ICD-10-CM | POA: Diagnosis not present

## 2018-05-21 DIAGNOSIS — N185 Chronic kidney disease, stage 5: Secondary | ICD-10-CM | POA: Diagnosis not present

## 2018-05-21 DIAGNOSIS — E039 Hypothyroidism, unspecified: Secondary | ICD-10-CM | POA: Diagnosis present

## 2018-05-21 DIAGNOSIS — F1021 Alcohol dependence, in remission: Secondary | ICD-10-CM | POA: Diagnosis present

## 2018-05-21 DIAGNOSIS — I6523 Occlusion and stenosis of bilateral carotid arteries: Secondary | ICD-10-CM | POA: Diagnosis present

## 2018-05-21 DIAGNOSIS — Z888 Allergy status to other drugs, medicaments and biological substances status: Secondary | ICD-10-CM | POA: Diagnosis not present

## 2018-05-21 DIAGNOSIS — I272 Pulmonary hypertension, unspecified: Secondary | ICD-10-CM | POA: Diagnosis present

## 2018-05-21 DIAGNOSIS — D631 Anemia in chronic kidney disease: Secondary | ICD-10-CM | POA: Diagnosis present

## 2018-05-21 DIAGNOSIS — N400 Enlarged prostate without lower urinary tract symptoms: Secondary | ICD-10-CM | POA: Diagnosis present

## 2018-05-21 DIAGNOSIS — H409 Unspecified glaucoma: Secondary | ICD-10-CM | POA: Diagnosis present

## 2018-05-21 DIAGNOSIS — I161 Hypertensive emergency: Secondary | ICD-10-CM | POA: Diagnosis present

## 2018-05-21 DIAGNOSIS — I5033 Acute on chronic diastolic (congestive) heart failure: Secondary | ICD-10-CM | POA: Diagnosis not present

## 2018-05-21 DIAGNOSIS — J9601 Acute respiratory failure with hypoxia: Secondary | ICD-10-CM | POA: Diagnosis present

## 2018-05-21 LAB — BASIC METABOLIC PANEL
Anion gap: 14 (ref 5–15)
BUN: 107 mg/dL — ABNORMAL HIGH (ref 8–23)
CO2: 22 mmol/L (ref 22–32)
Calcium: 9.6 mg/dL (ref 8.9–10.3)
Chloride: 106 mmol/L (ref 98–111)
Creatinine, Ser: 4.81 mg/dL — ABNORMAL HIGH (ref 0.61–1.24)
GFR calc Af Amer: 12 mL/min — ABNORMAL LOW (ref >60)
GFR calc non Af Amer: 10 mL/min — ABNORMAL LOW (ref >60)
Glucose, Bld: 90 mg/dL (ref 70–99)
POTASSIUM: 4 mmol/L (ref 3.5–5.1)
Sodium: 142 mmol/L (ref 135–145)

## 2018-05-21 LAB — AMMONIA: Ammonia: 28 umol/L (ref 9–35)

## 2018-05-21 LAB — RPR: RPR Ser Ql: NONREACTIVE

## 2018-05-21 LAB — VITAMIN B12: Vitamin B-12: 554 pg/mL (ref 180–914)

## 2018-05-21 NOTE — Progress Notes (Signed)
Progress Note    Jeffrey Campbell  EZM:[id number] DOB: [birth year]  DOA: 05/20/2018 PCP: [name], MD    Brief Narrative:     Medical records reviewed and are as summarized below:  Jeffrey Campbell is an 85 y.o. male  with medical history significant for chronic kidney disease stage V not yet on dialysis, coronary artery disease, pulmonary hypertension, chronic diastolic CHF, hypertension, and vascular dementia, now presenting to the emergency department for evaluation of chills with increased confusion and increased fatigue.  Assessment/Plan:   Principal Problem:   Acute on chronic diastolic CHF (congestive heart failure) (HCC) Active Problems:   OSA (obstructive sleep apnea)   Hypothyroidism   CAD (coronary artery disease)   Vascular dementia with behavioral disturbance (HCC)   CKD (chronic kidney disease), stage V (HCC)   Hypertensive urgency   Acute encephalopathy  Acute resp failure due to acute on chronic diastolic CHF vs URI - Presents with chills, rhinorrhea, increased confusion, and increased fatigue  - There is no peripheral edema, but BNP is 3,900 and there is vascular congestion on CXR  - He was treated with 80 mg IV Lasix in ED   -continue diuresis with Lasix 40 mg IV q12h, follow daily wt and I/O's -flu negative, monitor for fever   -if CR does not tolerate diuresis, may need renal consult and initiation of HD (follow with Dr. Justin Mend) ? Uremia- not eating much (BUN and CR stable)   Acute encephalopathy  - Presents with chills, rhinorrhea, increased confusion and increased fatigue  - Head CT is negative for acute findings and TSH wnl in ED  - BUN is 106, but this is decreased from a month ago  - SBP was 210 initially, but this has normalized and remains slightly confused (baseline is not clear) -ammonia/B12 normal -folate/RPR/HIV pending  Hypertensive urgency   - SBP 210 in ED, improved with Lasix  - Continue Norvasc, hydralazine, losartan, and  continue diuresis   CKD stage V  - SCr is 4.84 on admission, same as last month; BUN has decreased some  - He has AV graft in LUE but has not yet been dialyzed  -if not able to diurese with IV lasix, may need renal consult   CAD - No anginal complaints  - Continue ASA, Lipitor, ARB     Hypothyroidism  - TSH normal - Continue Synthroid     Family Communication/Anticipated D/C date and plan/Code Status   DVT prophylaxis: heparin. Code Status: Full Code.  Family Communication:  Disposition Plan:    Medical Consultants:    None.     Subjective:   Will wake up-- "I am at home"  Objective:    Vitals:   05/21/18 0837 05/21/18 0845 05/21/18 1143 05/21/18 1144  BP: (!) 167/152 (!) 180/68 (!) 168/145 (!) 156/58  Pulse: (!) 54  (!) 51   Resp: 20  20   Temp: 99.8 F (37.7 C)  98.3 F (36.8 C)   TempSrc: Oral  Oral   SpO2: 95%  94%   Weight:      Height:        Intake/Output Summary (Last 24 hours) at 05/21/2018 1250 Last data filed at 05/21/2018 1158 Gross per 24 hour  Intake 903 ml  Output 1450 ml  Net -547 ml   Filed Weights   05/20/18 1718 05/20/18 2300 05/21/18 0300  Weight: 63.5 kg 63 kg 62.5 kg    Exam: In bed, sleepy Chronically ill appearing Diminished, wearing  O2 rrr- lower end of 36s Min LE edema  Data Reviewed:   I have personally reviewed following labs and imaging studies:  Labs: Labs show the following:   Basic Metabolic Panel: Recent Labs  Lab 05/20/18 1750 05/21/18 0631  NA 140 142  K 4.2 4.0  CL 109 106  CO2 20* 22  GLUCOSE 127* 90  BUN 106* 107*  CREATININE 4.84* 4.81*  CALCIUM 9.5 9.6   GFR Estimated Creatinine Clearance: 10.3 mL/min (A) (by C-G formula based on SCr of 4.81 mg/dL (H)). Liver Function Tests: Recent Labs  Lab 05/20/18 1750  AST 17  ALT 14  ALKPHOS 43  BILITOT 1.3*  PROT 7.2  ALBUMIN 4.1   No results for input(s): LIPASE, AMYLASE in the last 168 hours. Recent Labs  Lab 05/21/18 0631    AMMONIA 28   Coagulation profile No results for input(s): INR, PROTIME in the last 168 hours.  CBC: Recent Labs  Lab 05/18/18 0859 05/20/18 1750  WBC  --  8.8  NEUTROABS  --  7.3  HGB 9.9* 9.4*  HCT  --  30.0*  MCV  --  99.0  PLT  --  122*   Cardiac Enzymes: No results for input(s): CKTOTAL, CKMB, CKMBINDEX, TROPONINI in the last 168 hours. BNP (last 3 results) Recent Labs    09/06/17 0850  PROBNP 2,000*   CBG: No results for input(s): GLUCAP in the last 168 hours. D-Dimer: No results for input(s): DDIMER in the last 72 hours. Hgb A1c: No results for input(s): HGBA1C in the last 72 hours. Lipid Profile: No results for input(s): CHOL, HDL, LDLCALC, TRIG, CHOLHDL, LDLDIRECT in the last 72 hours. Thyroid function studies: Recent Labs    05/20/18 1842  TSH 1.029   Anemia work up: Recent Labs    05/21/18 0631  VITAMINB12 554   Sepsis Labs: Recent Labs  Lab 05/20/18 1750  WBC 8.8    Microbiology No results found for this or any previous visit (from the past 240 hour(s)).  Procedures and diagnostic studies:  Dg Chest 2 View  Result Date: 05/20/2018 CLINICAL DATA:  Chills. Altered mental status. EXAM: CHEST - 2 VIEW COMPARISON:  09/20/2016 FINDINGS: Enlarged cardiac silhouette. Calcific atherosclerotic disease of the aorta. Mild pulmonary vascular congestion. Probable bilateral small pleural effusions. Osseous structures are without acute abnormality. Soft tissues are grossly normal. IMPRESSION: 1. Enlarged cardiac silhouette with mild pulmonary vascular congestion. 2. Probable bilateral small pleural effusions. Electronically Signed   By: Fidela Salisbury M.D.   On: 05/20/2018 18:34   Ct Head Wo Contrast  Result Date: 05/20/2018 CLINICAL DATA:  Chills. Confusion. EXAM: CT HEAD WITHOUT CONTRAST TECHNIQUE: Contiguous axial images were obtained from the base of the skull through the vertex without intravenous contrast. COMPARISON:  Brain MRI 09/12/2016  FINDINGS: Brain: No evidence of acute infarction, hemorrhage, hydrocephalus, extra-axial collection or mass lesion/mass effect. Moderate brain parenchymal volume loss and deep white matter microangiopathy. Vascular: Calcific atherosclerotic disease at the skull base. Skull: Normal. Negative for fracture or focal lesion. Sinuses/Orbits: Polypoid mucosal thickening of the bilateral maxillary sinuses Other: None. IMPRESSION: 1. No acute intracranial abnormality. 2. Moderate brain parenchymal atrophy and chronic microvascular disease. 3. Chronic bilateral maxillary sinusitis. Electronically Signed   By: Fidela Salisbury M.D.   On: 05/20/2018 18:38    Medications:   . amLODipine  10 mg Oral Daily  . aspirin EC  81 mg Oral Daily  . atorvastatin  20 mg Oral q1800  . brimonidine  1  drop Both Eyes BID  . brinzolamide  1 drop Both Eyes BID  . calcitRIOL  0.25 mcg Oral Daily  . doxazosin  2 mg Oral Daily  . feeding supplement (ENSURE ENLIVE)  237 mL Oral BID BM  . furosemide  40 mg Intravenous Q12H  . heparin  5,000 Units Subcutaneous Q8H  . hydrALAZINE  100 mg Oral TID  . levothyroxine  150 mcg Oral Q0600  . losartan  50 mg Oral Daily  . sodium chloride flush  3 mL Intravenous Q12H   Continuous Infusions: . sodium chloride       LOS: 0 days   Geradine Girt  [facility]   How to contact the [facility] Attending or Consulting provider [place] or covering provider during after hours [place], for this patient?  1. Check the care team in [facility] and look for a) attending/consulting [facility] provider listed and b) the [facility] team listed 2. Log into [email or web address] and use [facility]'s universal password to access. If you do not have the password, please contact the hospital operator. 3. Locate the [facility] provider you are looking for under [facility] and page to a number that you can be directly reached. 4. If you still have difficulty reaching the provider, please page the [facility] (Director on Call) for the  Hospitalists listed on amion for assistance.  05/21/2018, 12:50 PM

## 2018-05-21 NOTE — Progress Notes (Signed)
SATURATION QUALIFICATIONS: (This note is used to comply with regulatory documentation for home oxygen)  Patient Saturations on Room Air at Rest = 94%  Patient Saturations on Room Air while Ambulating = 97%   Please briefly explain why patient needs home oxygen:pt did not need oxygen

## 2018-05-21 NOTE — Progress Notes (Signed)
Pt is confused, trying to get out of bed, pt moved to rm 3E19 and was placed on low bed. Family members informed.

## 2018-05-21 NOTE — Progress Notes (Signed)
RT NOTE:  Pt refuses CPAP. Pt states he does not wear CPAP at home.

## 2018-05-22 LAB — CBC
HCT: 28 % — ABNORMAL LOW (ref 39.0–52.0)
Hemoglobin: 9 g/dL — ABNORMAL LOW (ref 13.0–17.0)
MCH: 31.6 pg (ref 26.0–34.0)
MCHC: 32.1 g/dL (ref 30.0–36.0)
MCV: 98.2 fL (ref 80.0–100.0)
Platelets: 116 10*3/uL — ABNORMAL LOW (ref 150–400)
RBC: 2.85 MIL/uL — AB (ref 4.22–5.81)
RDW: 15.5 % (ref 11.5–15.5)
WBC: 8.1 10*3/uL (ref 4.0–10.5)
nRBC: 0 % (ref 0.0–0.2)

## 2018-05-22 LAB — FOLATE RBC
Folate, Hemolysate: 462 ng/mL
Folate, RBC: 1644 ng/mL (ref >498)
Hematocrit: 28.1 % — ABNORMAL LOW (ref 37.5–51.0)

## 2018-05-22 LAB — BASIC METABOLIC PANEL
Anion gap: 10 (ref 5–15)
BUN: 112 mg/dL — ABNORMAL HIGH (ref 8–23)
CO2: 23 mmol/L (ref 22–32)
Calcium: 9.6 mg/dL (ref 8.9–10.3)
Chloride: 106 mmol/L (ref 98–111)
Creatinine, Ser: 4.82 mg/dL — ABNORMAL HIGH (ref 0.61–1.24)
GFR calc Af Amer: 12 mL/min — ABNORMAL LOW (ref >60)
GFR calc non Af Amer: 10 mL/min — ABNORMAL LOW (ref >60)
Glucose, Bld: 112 mg/dL — ABNORMAL HIGH (ref 70–99)
Potassium: 4 mmol/L (ref 3.5–5.1)
Sodium: 139 mmol/L (ref 135–145)

## 2018-05-22 LAB — PROCALCITONIN: Procalcitonin: 11.85 ng/mL

## 2018-05-22 LAB — HIV ANTIBODY (ROUTINE TESTING W REFLEX): HIV Screen 4th Generation wRfx: NONREACTIVE

## 2018-05-22 NOTE — Progress Notes (Signed)
Pt refusing CPAP at this time.

## 2018-05-22 NOTE — Progress Notes (Signed)
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   Assessment & Plan:   Principal Problem:   Acute on chronic diastolic CHF (congestive heart failure) (HCC) Active Problems:   OSA (obstructive sleep apnea)   Hypothyroidism   CAD (coronary artery disease)   Vascular dementia with behavioral disturbance (HCC)   CKD (chronic kidney disease), stage V ([place])   Hypertensive urgency   Acute encephalopathy   #) Acute metabolic encephalopathy/chills/vascular dementia: Unclear etiology.  Currently clinically the patient is looking and feeling fairly well.  No clear infectious source.  His mental status is cleared of dramatically being off any antibiotics.  He has had prior hospitalizations for hypertensive emergency presenting his acute.  His procalcitonin is quite elevated but he has no localizing signs or symptoms that he is profound kidney disease.  -Hold sedating agents -We will discuss with family about baseline as patient does have underlying vascular dementia -TSH normal  #) Hypertensive emergency/urgency/hyperlipidemia: Patient has had prior hospitalizations for encephalopathy in the setting of poorly controlled hypertension.  His encephalopathy per above appears to be likely multifactorial.  He is much improved now that his blood pressures are under control. -Continue losartan 50 mg daily -Continue hydralazine 100 mg 3 times daily -Continue manidipine 10 mg daily -Patient  has not tolerated nodal agents in the past due to bradycardia, history of AV junctional bradycardia -Continue pravastatin 20 mg daily  #) Acute on chronic diastolic heart failure: Patient currently appears to be euvolemic. -Continue IV furosemide 40 mg every 12 hours - Strict ins and outs, weigh daily, sodium restricted diet, fluid restriction -Telemetry  #) Stage V CKD: Patient has had fistula in place but is not on dialysis -Hold nephrotoxins -Continue calcitriol 0.25 mg daily  #) Coronary artery disease: No documented history of what exactly his anatomy is -Continue aspirin 81 mg daily -Continue statin, does not tolerate beta-blocker -Continue ARB  #) Hypothyroidism: -Continue levothyroxine 150 mcg daily  #) BPH: -Continue doxazosin 2 mg daily  #) OSA/pulmonary hypertension: -Encourage CPAP however patient is refused in the past  Fluids: Restrict Electrolytes: Monitor and supplement Nutrition: Heart healthy sodium restricted diet   Prophylaxis: Heparin  Disposition: Pending improved mental status  Full code   Consultants:   None  Procedures:   None  Antimicrobials:   None none   Subjective: Patient is awake and alert today.  He is slowed but appears to be oriented.  He denies any chest pain, abdominal pain, nausea, vomiting, diarrhea, cough, congestion, rhinorrhea.  Objective: Vitals:   05/21/18 1516 05/21/18 1919 05/21/18 2030 05/22/18 0355  BP: (!) 154/56  (!) 160/50 (!) 155/65  Pulse:  85  (!) 50  Resp:  18  18  Temp:  98.4 F (36.9 C)  98.6 F (37 C)  TempSrc:  Oral  Oral  SpO2:  96%  91%  Weight:    64.9 kg  Height:        Intake/Output Summary (Last 24 hours) at 05/22/2018 7622 Last data filed at 05/22/2018 6333 Gross per 24 hour  Intake  1440 ml  Output 1850 ml  Net -410 ml   Filed Weights   05/20/18 2300 05/21/18 0300 05/22/18 0355  Weight: 63 kg 62.5 kg 64.9 kg    Examination:  General exam: Appears calm and comfortable    Respiratory system: No increased work of breathing, diminished lung sounds at bases, no wheezes, crackles, rhonchi Cardiovascular system: Distant heart sounds, regular rate and rhythm, no murmurs Gastrointestinal system: Soft, nondistended, no rebound or guarding, plus bowel sounds. Central nervous system: Alert and oriented x4 but quite slowed, no focal neurological deficits Extremities: Trace lower extremity edema Skin: No rashes visible skin Psychiatry: Slowed but appropriate judgment and insight    Data Reviewed: I have personally reviewed following labs and imaging studies  CBC: Recent Labs  Lab 05/18/18 0859 05/20/18 1750 05/22/18 0519  WBC  --  8.8 8.1  NEUTROABS  --  7.3  --   HGB 9.9* 9.4* 9.0*  HCT  --  30.0* 28.0*  MCV  --  99.0 98.2  PLT  --  122* 195*   Basic Metabolic Panel: Recent Labs  Lab 05/20/18 1750 05/21/18 0631 05/22/18 0519  NA 140 142 139  K 4.2 4.0 4.0  CL 109 106 106  CO2 20* 22 23  GLUCOSE 127* 90 112*  BUN 106* 107* 112*  CREATININE 4.84* 4.81* 4.82*  CALCIUM 9.5 9.6 9.6   GFR: Estimated Creatinine Clearance: 10.7 mL/min (A) (by C-G formula based on SCr of 4.82 mg/dL (H)). Liver Function Tests: Recent Labs  Lab 05/20/18 1750  AST 17  ALT 14  ALKPHOS 43  BILITOT 1.3*  PROT 7.2  ALBUMIN 4.1   No results for input(s): LIPASE, AMYLASE in the last 168 hours. Recent Labs  Lab 05/21/18 0631  AMMONIA 28   Coagulation Profile: No results for input(s): INR, PROTIME in the last 168 hours. Cardiac Enzymes: No results for input(s): CKTOTAL, CKMB, CKMBINDEX, TROPONINI in the last 168 hours. BNP (last 3 results) Recent Labs    09/06/17 0850  PROBNP 2,000*   HbA1C: No results for input(s): HGBA1C in the last 72 hours. CBG: No results for input(s): GLUCAP in the last 168 hours. Lipid Profile: No results for input(s): CHOL, HDL, LDLCALC, TRIG, CHOLHDL, LDLDIRECT in the last 72 hours. Thyroid Function Tests: Recent Labs     05/20/18 1842  TSH 1.029   Anemia Panel: Recent Labs    05/21/18 0631  VITAMINB12 554   Sepsis Labs: Recent Labs  Lab 05/22/18 0519  PROCALCITON 11.85    No results found for this or any previous visit (from the past 240 hour(s)).       Radiology Studies: Dg Chest 2 View  Result Date: 05/20/2018 CLINICAL DATA:  Chills. Altered mental status. EXAM: CHEST - 2 VIEW COMPARISON:  09/20/2016 FINDINGS: Enlarged cardiac silhouette. Calcific atherosclerotic disease of the aorta. Mild pulmonary vascular congestion. Probable bilateral small pleural effusions. Osseous structures are without acute abnormality. Soft tissues are grossly normal. IMPRESSION: 1. Enlarged cardiac silhouette with mild pulmonary vascular congestion. 2. Probable bilateral small pleural effusions. Electronically Signed   By: Fidela Salisbury M.D.   On: 05/20/2018 18:34   Ct Head Wo Contrast  Result Date: 05/20/2018 CLINICAL DATA:  Chills. Confusion. EXAM: CT HEAD WITHOUT CONTRAST TECHNIQUE: Contiguous axial images were obtained from the base of the skull through the vertex without intravenous contrast. COMPARISON:  Brain MRI 09/12/2016 FINDINGS: Brain: No evidence of acute infarction, hemorrhage, hydrocephalus, extra-axial collection or mass lesion/mass effect. Moderate brain parenchymal volume  loss and deep white matter microangiopathy. Vascular: Calcific atherosclerotic disease at the skull base. Skull: Normal. Negative for fracture or focal lesion. Sinuses/Orbits: Polypoid mucosal thickening of the bilateral maxillary sinuses Other: None. IMPRESSION: 1. No acute intracranial abnormality. 2. Moderate brain parenchymal atrophy and chronic microvascular disease. 3. Chronic bilateral maxillary sinusitis. Electronically Signed   By: Fidela Salisbury M.D.   On: 05/20/2018 18:38        Scheduled Meds: . amLODipine  10 mg Oral Daily  . aspirin EC  81 mg Oral Daily  . atorvastatin  20 mg Oral q1800  . brimonidine   1 drop Both Eyes BID  . brinzolamide  1 drop Both Eyes BID  . calcitRIOL  0.25 mcg Oral Daily  . doxazosin  2 mg Oral Daily  . feeding supplement (ENSURE ENLIVE)  237 mL Oral BID BM  . furosemide  40 mg Intravenous Q12H  . heparin  5,000 Units Subcutaneous Q8H  . hydrALAZINE  100 mg Oral TID  . levothyroxine  150 mcg Oral Q0600  . losartan  50 mg Oral Daily  . sodium chloride flush  3 mL Intravenous Q12H   Continuous Infusions: . sodium chloride       LOS: 1 day    Time spent: [place], MD [facility]  If 7PM-7AM, please contact night-coverage [email or web address] Password [facility] 05/22/2018, 9:23 AM

## 2018-05-22 NOTE — Evaluation (Signed)
Physical Therapy Evaluation Patient Details Name: Jeffrey Campbell MRN: [id number] DOB: [birth year] Today's Date: 05/22/2018   History of Present Illness  85 year old with past medical history relevant for grade 2 diastolic dysfunction and moderate pulmonary hypertension by echo on 08/30/2017, coronary artery disease, obstructive sleep apnea, stage V CKD not on dialysis but with fistula in place, hypertension, hyperlipidemia, vascular dementia who presented with fevers and chills and found to have acute metabolic encephalopathy, hypertensive emergency, acute on chronic diastolic heart failure.  Clinical Impression  Pt admitted with above diagnosis. Pt currently with functional limitations due to the deficits listed below (see PT Problem List). Pt was able to ambulate needing assist with challenges.  Discussed pt should use RW initially and pt states he will. He also states wife will be there initially.  Will need 24 hour assist and if doesn;t have that, SNF recommended.  Pt is confused intermittently.   Pt will benefit from skilled PT to increase their independence and safety with mobility to allow discharge to the venue listed below.      Follow Up Recommendations Home health PT;Supervision/Assistance - 24 hour    Equipment Recommendations  None recommended by PT    Recommendations for Other Services       Precautions / Restrictions Precautions Precautions: Fall Restrictions Weight Bearing Restrictions: No      Mobility  Bed Mobility               General bed mobility comments: standing at sink  Transfers Overall transfer level: Independent               General transfer comment: Pt was at sink on arrival standing and bathing with independence.  Finished bathing and PT assiste pt with gown.   Ambulation/Gait Ambulation/Gait assistance: Min guard;Supervision Gait Distance (Feet): 150 Feet Assistive device: None Gait Pattern/deviations: Decreased stride  length;Step-through pattern;Drifts right/left   Gait velocity interpretation: 1.31 - 2.62 ft/sec, indicative of limited community ambulator General Gait Details: Overall, pt does well.  Did drift right and left with chalelnges to balance especially with head turns and if feet are closer together. Encouraged pt to use RW initially and he says he has one.   Stairs            Wheelchair Mobility    Modified Rankin (Stroke Patients Only)       Balance Overall balance assessment: Needs assistance         Standing balance support: No upper extremity supported;During functional activity Standing balance-Leahy Scale: Fair                   Standardized Balance Assessment Standardized Balance Assessment : Dynamic Gait Index   Dynamic Gait Index Level Surface: Normal Change in Gait Speed: Normal Gait with Horizontal Head Turns: Moderate Impairment Gait with Vertical Head Turns: Mild Impairment Gait and Pivot Turn: Mild Impairment Step Over Obstacle: Mild Impairment Step Around Obstacles: Normal Steps: Mild Impairment Total Score: 18       Pertinent Vitals/Pain Pain Assessment: No/denies pain    Home Living Family/patient expects to be discharged to:: Private residence Living Arrangements: Spouse/significant other Available Help at Discharge: Family;Available 24 hours/day Type of Home: House Home Access: Level entry     Home Layout: One level Home Equipment: Cane - single point;Shower seat;Walker - 2 wheels      Prior Function Level of Independence: Independent               Hand Dominance  Dominant Hand: Right    Extremity/Trunk Assessment   Upper Extremity Assessment Upper Extremity Assessment: Defer to OT evaluation    Lower Extremity Assessment Lower Extremity Assessment: Generalized weakness    Cervical / Trunk Assessment Cervical / Trunk Assessment: Normal  Communication   Communication: No difficulties  Cognition  Arousal/Alertness: Awake/alert Behavior During Therapy: WFL for tasks assessed/performed Overall Cognitive Status: Impaired/Different from baseline Area of Impairment: Orientation;Following commands;Safety/judgement;Awareness;Problem solving                 Orientation Level: Disoriented to;Situation;Time     Following Commands: Follows one step commands with increased time Safety/Judgement: Decreased awareness of safety   Problem Solving: Slow processing;Decreased initiation;Requires verbal cues;Requires tactile cues;Difficulty sequencing        General Comments General comments (skin integrity, edema, etc.): Scored 18/24 on DGI suggesting risk of falls without device.     Exercises     Assessment/Plan    PT Assessment Patient needs continued PT services  PT Problem List Decreased activity tolerance;Decreased balance;Decreased mobility;Decreased knowledge of use of DME;Decreased safety awareness;Decreased knowledge of precautions       PT Treatment Interventions DME instruction;Gait training;Functional mobility training;Therapeutic activities;Therapeutic exercise;Balance training;Patient/family education    PT Goals (Current goals can be found in the Care Plan section)  Acute Rehab PT Goals Patient Stated Goal: to go home PT Goal Formulation: With patient Time For Goal Achievement: 06/05/18 Potential to Achieve Goals: Good    Frequency Min 3X/week   Barriers to discharge        Co-evaluation               AM-PAC PT "6 Clicks" Mobility  Outcome Measure Help needed turning from your back to your side while in a flat bed without using bedrails?: None Help needed moving from lying on your back to sitting on the side of a flat bed without using bedrails?: None Help needed moving to and from a bed to a chair (including a wheelchair)?: None Help needed standing up from a chair using your arms (e.g., wheelchair or bedside chair)?: None Help needed to walk in  hospital room?: A Little Help needed climbing 3-5 steps with a railing? : A Little 6 Click Score: 22    End of Session Equipment Utilized During Treatment: Gait belt Activity Tolerance: Patient limited by fatigue Patient left: in chair;with call bell/phone within reach;with chair alarm set Nurse Communication: Mobility status PT Visit Diagnosis: Unsteadiness on feet (R26.81);Muscle weakness (generalized) (M62.81)    Time: 8887-5797 PT Time Calculation (min) (ACUTE ONLY): 15 min   Charges:   PT Evaluation $PT Eval Moderate Complexity: [place] Pager:  [phone]  Office:  [phone]    Denice Paradise 05/22/2018, 1:32 PM

## 2018-05-23 ENCOUNTER — Other Ambulatory Visit: Payer: Self-pay | Admitting: Family Medicine

## 2018-05-23 DIAGNOSIS — R0989 Other specified symptoms and signs involving the circulatory and respiratory systems: Secondary | ICD-10-CM

## 2018-05-23 LAB — RENAL FUNCTION PANEL
Albumin: 3.1 g/dL — ABNORMAL LOW (ref 3.5–5.0)
Anion gap: 13 (ref 5–15)
BUN: 130 mg/dL — ABNORMAL HIGH (ref 8–23)
CO2: 22 mmol/L (ref 22–32)
Calcium: 9.6 mg/dL (ref 8.9–10.3)
Chloride: 103 mmol/L (ref 98–111)
Creatinine, Ser: 4.9 mg/dL — ABNORMAL HIGH (ref 0.61–1.24)
GFR calc Af Amer: 12 mL/min — ABNORMAL LOW (ref >60)
GFR calc non Af Amer: 10 mL/min — ABNORMAL LOW (ref >60)
Glucose, Bld: 97 mg/dL (ref 70–99)
Phosphorus: 4.1 mg/dL (ref 2.5–4.6)
Potassium: 3.9 mmol/L (ref 3.5–5.1)
Sodium: 138 mmol/L (ref 135–145)

## 2018-05-23 LAB — CBC
HCT: 30.6 % — ABNORMAL LOW (ref 39.0–52.0)
Hemoglobin: 9.8 g/dL — ABNORMAL LOW (ref 13.0–17.0)
MCH: 31.2 pg (ref 26.0–34.0)
MCHC: 32 g/dL (ref 30.0–36.0)
MCV: 97.5 fL (ref 80.0–100.0)
Platelets: 142 10*3/uL — ABNORMAL LOW (ref 150–400)
RBC: 3.14 MIL/uL — ABNORMAL LOW (ref 4.22–5.81)
RDW: 15.5 % (ref 11.5–15.5)
WBC: 5.9 10*3/uL (ref 4.0–10.5)
nRBC: 0 % (ref 0.0–0.2)

## 2018-05-23 LAB — PROCALCITONIN: Procalcitonin: 11.43 ng/mL

## 2018-05-23 NOTE — Discharge Instructions (Signed)
Confusion °Confusion is the inability to think with the usual speed or clarity. People who are confused often describe their thinking as cloudy or unclear. Confusion can also include feeling disoriented. This means you are unaware of where you are or who you are. You may also not know the date or time. When confused, you may have difficulty remembering, paying attention, or making decisions. Some people also act aggressively when they are confused. °In some cases, confusion may come on quickly. In other cases, it may develop slowly over time. How quickly confusion comes on depends on the cause. °Confusion may be caused by: °· Head injury (concussion). °· Seizures. °· Stroke. °· Fever. °· Brain tumor. °· Decrease in brain function due to a vascular or neurologic condition (dementia). °· Emotions, like rage or terror. °· Inability to know what is real and what is not (hallucinations). °· Infections, such as a urinary tract infection (UTI). °· Using too much alcohol, drugs, or medicines. °· Loss of fluid (dehydration) or an imbalance of salts in the body (electrolytes). °· Lack of sleep. °· Low blood sugar (diabetes). °· Low levels of oxygen. This comes from conditions such as chronic lung disorders. °· Side effects of medicines, or taking medicines that affect other medicines (drug interactions). °· Lack of certain nutrients, especially niacin, thiamine, vitamin C, or vitamin B. °· Sudden drop in body temperature (hypothermia). °· Change in routine, such as traveling or being hospitalized. °Follow these instructions at home: °Pay attention to your symptoms. Tell your health care provider about any changes or if you develop new symptoms. Follow these instructions to control or treat symptoms. Ask a family member or friend for help if needed. °Medicines °· Take over-the-counter and prescription medicines only as told by your health care provider. °· Ask your health care provider about changing or stopping any medicines  that may be causing your confusion. °· Avoid pain medicines or sleep medicines until you have fully recovered. °· Use a pillbox or an alarm to help you take the right medicines at the right time. °Lifestyle ° °· Eat a balanced diet that includes fruits and vegetables. °· Get enough sleep. For most adults, this is 7-9 hours each night. °· Do not drink alcohol. °· Do not become isolated. Spend time with other people and make plans for your days. °· Do not drive until your health care provider says that it is safe to do so. °· Do not use any products that contain nicotine or tobacco, such as cigarettes and e-cigarettes. If you need help quitting, ask your health care provider. °· Stop other activities that may increase your chances of getting hurt. These may include some work duties, sports activities, swimming, or bike riding. Ask your health care provider what activities are safe for you. °What caregivers can do °· Find out if the person is confused. Ask the person to state his or her name, age, and the date. If the person is unsure or answers incorrectly, he or she may be confused. °· Always introduce yourself, no matter how well the person knows you. °· Remind the person of his or her location. Do this often. °· Place a calendar and clock near the person who is confused. °· Talk about current events and plans for the day. °· Keep the environment calm, quiet, and peaceful. °· Help the person do the things that he or she is unable to do. These include: °? Taking medicines. °? Keeping follow-up visits with his or her health care   provider. ? Helping with household duties, including meal preparation. ? Running errands.  Get help if you need it. There are several support groups for caregivers.  If the person you are helping needs more support, consider day care, extended care programs, or a skilled nursing facility. The person's health care provider may be able to help evaluate these options. General  instructions  Monitor yourself for any conditions you may have. These may include: ? Checking your blood glucose levels, if you have diabetes. ? Watching your weight, if you are overweight. ? Monitoring your blood pressure, if you have hypertension. ? Monitoring your body temperature, if you have a fever.  Keep all follow-up visits as told by your health care provider. This is important. Contact a health care provider if:  Your symptoms get worse. Get help right away if you:  Feel that you are not able to care for yourself.  Develop severe headaches, repeated vomiting, seizures, blackouts, or slurred speech.  Have increasing confusion, weakness, numbness, restlessness, or personality changes.  Develop a loss of balance, have marked dizziness, feel uncoordinated, or fall.  Develop severe anxiety, or you have delusions or hallucinations. These symptoms may represent a serious problem that is an emergency. Do not wait to see if the symptoms will go away. Get medical help right away. Call your local emergency services (911 in the [place] Do not drive yourself to the hospital. Summary  Confusion is the inability to think with the usual speed or clarity. People who are confused often describe their thinking as cloudy or unclear.  Confusion can also include having difficulty remembering, paying attention, or making decisions.  Confusion may come on quickly or develop slowly over time, depending on the cause. There are many different causes of confusion.  Ask for help from family members or friends if you are unable to take care of yourself. This information is not intended to replace advice given to you by your health care provider. Make sure you discuss any questions you have with your health care provider. Document Released: 04/22/2004 Document Revised: 03/17/2017 Document Reviewed: 03/17/2017 Elsevier Interactive Patient Education  2019 [employer].

## 2018-05-23 NOTE — Care Management Note (Signed)
Case Management Note  Patient Details  Name: Jeffrey Campbell MRN: [id number] Date of Birth: June 01, 1932  Subjective/Objective:    CHF               Action/Plan: Patient lives at home with spouse. PCP: [name], MD; has private insurance with Medicare; pharmacy of choice [facility] order [facility]; no DME. CM talked to patient about [place] choices, pt refused x 2 stated " I have had that before and I do not want it again. " Patient stated that he continues to drive and is active as much as possible.   Expected Discharge Date:  05/23/18               Expected Discharge Plan:  [place]  In-House Referral:   [facility]  Discharge planning Services  CM Consult     Choice offered to:  Patient  HH Arranged:  Patient Refused HH  Status of Service:  In process, will continue to follow  Sherrilyn Rist [phone] 05/23/2018, 10:20 AM

## 2018-05-23 NOTE — Discharge Summary (Signed)
Physician Discharge Summary  Jeffrey Campbell RCV:[id number] DOB: Mar 26, 1933 DOA: 05/20/2018  PCP: [name], MD  Admit date: 05/20/2018 Discharge date: 05/23/2018  Admitted From: Home Disposition:  Home  Recommendations for Outpatient Follow-up:  1. Follow up with PCP in 1-2 weeks 2. Please obtain BMP/CBC in one week   Home Health: HHPT Equipment/Devices:none  Discharge Condition: stable CODE STATUS: FUL Diet recommendation: Heart Healthy  Brief/Interim Summary:  #) Acute metabolic encephalopathy/chills/vascular dementia: Patient was admitted with acute metabolic encephalopathy.  There is no clear infectious source.  His elevated procalcitonin was attributed to his CKD.  He has had prior hospitalizations for hypertensive emergency and acute metabolic encephalopathy that this appears to be consistent with.  Patient was monitored and his mental status gradually improved.  He was seen by physical therapy recommended home health PT with 24-hour care versus skilled nursing facility.  His wife offered to attempt home health PT.  #) Hypertensive emergency/urgency/hyperlipidemia: Patient's encephalopathy appeared to be multifactorial but was likely secondary to hypertensive emergency with underlying vascular dementia.  Patient greatly improved once his blood pressures were controlled with IV medications.  He was transitioned to home losartan, hydralazine, amlodipine.  He has not tolerated nodal agents in the past due to bradycardia as well as a history of AV junctional bradycardia.  Patient was continued on statin.  #) Acute on chronic diastolic heart failure: Patient was felt to have mild acute on chronic diastolic heart failure.  Patient was initially on IV furosemide.  He was then discharged back to his home dose of oral furosemide.  #) Stage V CKD: Patient's creatinine was stable.  Patient did have a fistula in place but this has not matured yet.  Patient did not require dialysis.  #)  OSA/pulmonary hypertension: Patient has a history of pulmonary hypertension that was attributed at least partially to uncontrolled OSA.  Patient is refused CPAP.  #) BPH: Patient was continued on home doxazosin.  #) History of coronary artery disease: Patient was continued on aspirin, statin, ARB.  Patient has not tolerated beta-blocker in the past.  #) Hypothyroidism: Patient was continued on home levothyroxine.  Discharge Diagnoses:  Principal Problem:   Acute on chronic diastolic CHF (congestive heart failure) (HCC) Active Problems:   OSA (obstructive sleep apnea)   Hypothyroidism   CAD (coronary artery disease)   Vascular dementia with behavioral disturbance (HCC)   CKD (chronic kidney disease), stage V (HCC)   Hypertensive urgency   Acute encephalopathy    Discharge Instructions  Discharge Instructions    Call MD for:  difficulty breathing, headache or visual disturbances   Complete by:  As directed    Call MD for:  extreme fatigue   Complete by:  As directed    Call MD for:  hives   Complete by:  As directed    Call MD for:  persistant dizziness or light-headedness   Complete by:  As directed    Call MD for:  persistant nausea and vomiting   Complete by:  As directed    Call MD for:  redness, tenderness, or signs of infection (pain, swelling, redness, odor or green/yellow discharge around incision site)   Complete by:  As directed    Call MD for:  severe uncontrolled pain   Complete by:  As directed    Call MD for:  temperature >100.4   Complete by:  As directed    Diet - low sodium heart healthy   Complete by:  As directed  Discharge instructions   Complete by:  As directed    Please follow-up with your primary care doctor in 1 week.   Increase activity slowly   Complete by:  As directed      Allergies as of 05/23/2018      Reactions   Iodinated Diagnostic Agents Other (See Comments)   Right heart cath 07/2016 allegedly "shut down his kidneys" Patient has  stage III chronic kidney disease   Zocor [simvastatin] Other (See Comments)   Liver problems    Budesonide-formoterol Fumarate Other (See Comments)   Dry mouth   Minoxidil Other (See Comments)   Tiotropium Bromide Monohydrate Other (See Comments)   Dry mouth   Tricor [fenofibrate]          Medication List    STOP taking these medications   potassium chloride 10 MEQ tablet Commonly known as:  KLOR-CON M10     TAKE these medications   amLODipine 10 MG tablet Commonly known as:  NORVASC Take 1 tablet (10 mg total) by mouth daily.   aspirin EC 81 MG tablet Take 81 mg by mouth daily.   atorvastatin 20 MG tablet Commonly known as:  LIPITOR TAKE 1 TABLET BY MOUTH EVERY DAY   calcitRIOL 0.25 MCG capsule Commonly known as:  ROCALTROL Take 0.25 mcg by mouth daily.   doxazosin 4 MG tablet Commonly known as:  CARDURA Take 4 mg by mouth 2 (two) times daily.   doxazosin 2 MG tablet Commonly known as:  CARDURA Take 2 mg by mouth daily.   feeding supplement (ENSURE ENLIVE) Liqd Take 237 mLs by mouth 2 (two) times daily between meals.   ferrous sulfate 325 (65 FE) MG tablet Take 325 mg by mouth 2 (two) times daily with a meal.   furosemide 40 MG tablet Commonly known as:  LASIX Take 40 mg by mouth 2 (two) times daily.   hydrALAZINE 100 MG tablet Commonly known as:  APRESOLINE Take 1 tablet (100 mg total) by mouth 3 (three) times daily. Please make yearly appt with Dr. Radford Pax for October. 1st attempt   levothyroxine 150 MCG tablet Commonly known as:  SYNTHROID, LEVOTHROID Take 150 mcg by mouth daily before breakfast.   losartan 25 MG tablet Commonly known as:  COZAAR Take 25 mg by mouth at bedtime.   losartan 50 MG tablet Commonly known as:  COZAAR Take 50 mg by mouth daily.   multivitamin with minerals Tabs tablet Take 1 tablet by mouth daily.   potassium chloride 10 MEQ tablet Commonly known as:  K-DUR TAKE 1 TABLET BY MOUTH EVERY DAY   PROCRIT IJ Inject  5,000 Units as directed every 7 (seven) days.   SIMBRINZA 1-0.2 % Susp Generic drug:  Brinzolamide-Brimonidine Place 1 drop into both eyes 2 (two) times daily.      Follow-up Information    [name], MD. Go on 05/30/2018.   Specialty:  Family Medicine Why:  @9 :Engineer, civil (consulting) information: [place] [address] [phone]          Allergies  Allergen Reactions  . Iodinated Diagnostic Agents Other (See Comments)    Right heart cath 07/2016 allegedly "shut down his kidneys" Patient has stage III chronic kidney disease  . Zocor [Simvastatin] Other (See Comments)    Liver problems   . Budesonide-Formoterol Fumarate Other (See Comments)    Dry mouth  . Minoxidil Other (See Comments)  . Tiotropium Bromide Monohydrate Other (See Comments)    Dry mouth  . Tricor [  Fenofibrate]          Consultations:  None   Procedures/Studies: Dg Chest 2 View  Result Date: 05/20/2018 CLINICAL DATA:  Chills. Altered mental status. EXAM: CHEST - 2 VIEW COMPARISON:  09/20/2016 FINDINGS: Enlarged cardiac silhouette. Calcific atherosclerotic disease of the aorta. Mild pulmonary vascular congestion. Probable bilateral small pleural effusions. Osseous structures are without acute abnormality. Soft tissues are grossly normal. IMPRESSION: 1. Enlarged cardiac silhouette with mild pulmonary vascular congestion. 2. Probable bilateral small pleural effusions. Electronically Signed   By: Fidela Salisbury M.D.   On: 05/20/2018 18:34   Ct Head Wo Contrast  Result Date: 05/20/2018 CLINICAL DATA:  Chills. Confusion. EXAM: CT HEAD WITHOUT CONTRAST TECHNIQUE: Contiguous axial images were obtained from the base of the skull through the vertex without intravenous contrast. COMPARISON:  Brain MRI 09/12/2016 FINDINGS: Brain: No evidence of acute infarction, hemorrhage, hydrocephalus, extra-axial collection or mass lesion/mass effect. Moderate brain parenchymal volume loss and deep white  matter microangiopathy. Vascular: Calcific atherosclerotic disease at the skull base. Skull: Normal. Negative for fracture or focal lesion. Sinuses/Orbits: Polypoid mucosal thickening of the bilateral maxillary sinuses Other: None. IMPRESSION: 1. No acute intracranial abnormality. 2. Moderate brain parenchymal atrophy and chronic microvascular disease. 3. Chronic bilateral maxillary sinusitis. Electronically Signed   By: Fidela Salisbury M.D.   On: 05/20/2018 18:38     Subjective:   Discharge Exam: Vitals:   05/22/18 2010 05/23/18 0318  BP: 140/71 (!) 163/53  Pulse: (!) 49 (!) 47  Resp: 18 18  Temp: 97.9 F (36.6 C) 98.3 F (36.8 C)  SpO2: 96% 98%   Vitals:   05/22/18 1138 05/22/18 1530 05/22/18 2010 05/23/18 0318  BP: (!) 153/135 (!) 142/60 140/71 (!) 163/53  Pulse: (!) 56  (!) 49 (!) 47  Resp: 18  18 18   Temp: (!) 97.5 F (36.4 C)  97.9 F (36.6 C) 98.3 F (36.8 C)  TempSrc: Oral  Oral Oral  SpO2: 97%  96% 98%  Weight:    65.8 kg  Height:        General exam: Appears calm and comfortable  Respiratory system: No increased work of breathing, diminished lung sounds at bases, no wheezes, crackles, rhonchi Cardiovascular system: Distant heart sounds, regular rate and rhythm, no murmurs Gastrointestinal system: Soft, nondistended, no rebound or guarding, plus bowel sounds. Central nervous system: Alert and oriented x4 but quite slowed, no focal neurological deficits Extremities: Trace lower extremity edema Skin: No rashes visible skin Psychiatry: Slowed but appropriate judgment and insight     The results of significant diagnostics from this hospitalization (including imaging, microbiology, ancillary and laboratory) are listed below for reference.     Microbiology: No results found for this or any previous visit (from the past 240 hour(s)).   Labs: BNP (last 3 results) Recent Labs    05/20/18 1933  BNP 1,607.3*   Basic Metabolic Panel: Recent Labs  Lab  05/20/18 1750 05/21/18 0631 05/22/18 0519 05/23/18 0442  NA 140 142 139 138  K 4.2 4.0 4.0 3.9  CL 109 106 106 103  CO2 20* 22 23 22   GLUCOSE 127* 90 112* 97  BUN 106* 107* 112* 130*  CREATININE 4.84* 4.81* 4.82* 4.90*  CALCIUM 9.5 9.6 9.6 9.6  PHOS  --   --   --  4.1   Liver Function Tests: Recent Labs  Lab 05/20/18 1750 05/23/18 0442  AST 17  --   ALT 14  --   ALKPHOS 43  --  BILITOT 1.3*  --   PROT 7.2  --   ALBUMIN 4.1 3.1*   No results for input(s): LIPASE, AMYLASE in the last 168 hours. Recent Labs  Lab 05/21/18 0631  AMMONIA 28   CBC: Recent Labs  Lab 05/18/18 0859 05/20/18 1750 05/21/18 0631 05/22/18 0519 05/23/18 0442  WBC  --  8.8  --  8.1 5.9  NEUTROABS  --  7.3  --   --   --   HGB 9.9* 9.4*  --  9.0* 9.8*  HCT  --  30.0* 28.1* 28.0* 30.6*  MCV  --  99.0  --  98.2 97.5  PLT  --  122*  --  116* 142*   Cardiac Enzymes: No results for input(s): CKTOTAL, CKMB, CKMBINDEX, TROPONINI in the last 168 hours. BNP: Invalid input(s): POCBNP CBG: No results for input(s): GLUCAP in the last 168 hours. D-Dimer No results for input(s): DDIMER in the last 72 hours. Hgb A1c No results for input(s): HGBA1C in the last 72 hours. Lipid Profile No results for input(s): CHOL, HDL, LDLCALC, TRIG, CHOLHDL, LDLDIRECT in the last 72 hours. Thyroid function studies Recent Labs    05/20/18 1842  TSH 1.029   Anemia work up Recent Labs    05/21/18 0631  VITAMINB12 554   Urinalysis    Component Value Date/Time   COLORURINE YELLOW 05/20/2018 1841   APPEARANCEUR CLEAR 05/20/2018 1841   LABSPEC 1.015 05/20/2018 1841   PHURINE 5.0 05/20/2018 1841   GLUCOSEU NEGATIVE 05/20/2018 1841   HGBUR NEGATIVE 05/20/2018 1841   BILIRUBINUR NEGATIVE 05/20/2018 1841   KETONESUR NEGATIVE 05/20/2018 1841   PROTEINUR 30 (A) 05/20/2018 1841   NITRITE NEGATIVE 05/20/2018 1841   LEUKOCYTESUR NEGATIVE 05/20/2018 1841   Sepsis Labs Invalid input(s): PROCALCITONIN,  WBC,   LACTICIDVEN Microbiology No results found for this or any previous visit (from the past 240 hour(s)).   Time coordinating discharge: 35  SIGNED:   Cristy Folks, MD  [facility] 05/23/2018, 10:00 AM   If 7PM-7AM, please contact night-coverage [email or web address] Password [email or web address]

## 2018-05-23 NOTE — Progress Notes (Signed)
Pt being discharged from hospital per orders from MD. Pt educated on discharge instructions. Pt verbalized understanding of instructions. All questions and concerns were addressed. Pt's IV was removed prior to discharge. Pt exited hospital via wheelchair accompanied by staff. 

## 2018-05-23 NOTE — Consult Note (Signed)
[facility] CM Primary Care Navigator  05/23/2018  Jeffrey Campbell 1932-09-24 [id number]    Met with patient and wife ([name]) at the [place] identify possible discharge needs.  Wifereportsthat patient had "chills, weakness and confusion" that had led to this admission and was found to have acute metabolic encephalopathy, hypertensive emergency, acute on chronic diastolic heart failure.  PatientendorsesDr.Elizabeth Drema [place] with [place] [facility] as hisprimary care provider.   [facility] [address] [facility] medications withoutdifficulty.  Patientreports managinghisown medications at home using "pill organizer" filled up daily.  Patientverbalized that he has been driving prior to admission but wife will be able to provide transportation tohisdoctors' appointments after discharge.  Patient lives at [facility] wife who will serve as his primary caregiver.   Anticipated discharge plan ishomeper patient.  Patient and wifevoiced understanding to call primary care provider's officeupondischargefor a post discharge follow-up visitwithin1- 2 weeksor sooner if needs arise.Patient letter (with PCP's contact number) was provided astheirreminder.  Explained topatientand wife regardingTHN CM services available for health managementand resourcesat homeandboth indicated interest for it, and had opted/ verbally agreedwithEMMI HF callsto follow-upwithhis recovery athome.  Referral was made for EMMI HFcalls after discharge.   Patient and wifereportshaving a weighing scale and plans to regularly monitor/ recordweight once discharge; continue to follow diet restrictions, stay active as possible, continue taking medications as ordered and closely following-up with providers when needed.Patient's health conditions are being managed at home with assistance of wife.   Patient and  wifeexpressed understanding to seekreferral to [facility] care managementfrom primary care provider if deemed necessaryand appropriateforfurther servicesin the near future.   [facility] care management information provided for future needs thatpatientmay have.  RN notified of patient/ wife's concern regarding home health services.   Primary care provider's office is listed as providing transition of care (TOC) follow-up.    For additional questions please contact:  Edwena Felty A. Tighe Gitto, BSN, RN-BC [facility] PRIMARY CARE Navigator Cell: [phone]

## 2018-05-25 ENCOUNTER — Encounter (HOSPITAL_COMMUNITY)
Admission: RE | Admit: 2018-05-25 | Discharge: 2018-05-25 | Disposition: A | Discharge status: Home / Self Care | Payer: Medicare Other | Source: Ambulatory Visit | Attending: Nephrology | Admitting: Nephrology

## 2018-05-25 VITALS — BP 163/51 | HR 53 | Temp 97.7°F | Resp 20

## 2018-05-25 DIAGNOSIS — I130 Hypertensive heart and chronic kidney disease with heart failure and stage 1 through stage 4 chronic kidney disease, or unspecified chronic kidney disease: Secondary | ICD-10-CM

## 2018-05-25 LAB — IRON AND TIBC
Iron: 38 ug/dL — ABNORMAL LOW (ref 45–182)
Saturation Ratios: 15 % — ABNORMAL LOW (ref 17.9–39.5)
TIBC: 260 ug/dL (ref 250–450)
UIBC: 222 ug/dL

## 2018-05-25 LAB — POCT HEMOGLOBIN-HEMACUE: Hemoglobin: 9.3 g/dL — ABNORMAL LOW (ref 13.0–17.0)

## 2018-05-25 LAB — FERRITIN: Ferritin: 255 ng/mL (ref 24–336)

## 2018-05-25 MED ORDER — EPOETIN ALFA 2000 UNIT/ML IJ SOLN
INTRAMUSCULAR | Status: AC
  Administered 2018-05-25: 2000 [IU] via SUBCUTANEOUS
  Filled 2018-05-25: qty 1

## 2018-05-25 MED ORDER — EPOETIN ALFA 10000 UNIT/ML IJ SOLN: 5000.0000 [IU] | INTRAMUSCULAR | Status: DC

## 2018-05-25 MED ORDER — EPOETIN ALFA 3000 UNIT/ML IJ SOLN
INTRAMUSCULAR | Status: AC
  Administered 2018-05-25: 3000 [IU] via SUBCUTANEOUS
  Filled 2018-05-25: qty 1

## 2018-05-26 ENCOUNTER — Ambulatory Visit
Admission: RE | Admit: 2018-05-26 | Discharge: 2018-05-26 | Disposition: A | Discharge status: Home / Self Care | Payer: Medicare Other | Source: Ambulatory Visit | Attending: Family Medicine | Admitting: Family Medicine

## 2018-05-26 DIAGNOSIS — R0989 Other specified symptoms and signs involving the circulatory and respiratory systems: Secondary | ICD-10-CM

## 2018-05-26 DIAGNOSIS — I6523 Occlusion and stenosis of bilateral carotid arteries: Secondary | ICD-10-CM | POA: Diagnosis not present

## 2018-05-27 LAB — CULTURE, BLOOD (ROUTINE X 2)
Culture: NO GROWTH
Culture: NO GROWTH
Special Requests: ADEQUATE
Special Requests: ADEQUATE

## 2018-05-30 ENCOUNTER — Other Ambulatory Visit: Payer: Self-pay | Admitting: Cardiology

## 2018-05-30 ENCOUNTER — Other Ambulatory Visit: Payer: Self-pay

## 2018-05-30 NOTE — Patient Outreach (Addendum)
[facility]) Care Management  05/30/2018  Jeffrey Campbell [birth year] [id number]    EMMI-HF RED ON EMMI ALERT Day # 3 Date: 05/29/2018 Red Alert Reason: "New/worsening problems? Yes"   Outreach attempt # 1 to patient. A male answered and reported that patient was not available at this time.      Plan: RN CM will make outreach attempt to patient within 3-4 business days. RN CM will send unsuccessful outreach letter to patient.  Enzo Montgomery, RN,BSN,CCM [place] [facility] Telephonic Care Management Coordinator Direct Phone: [phone] Toll Free: [phone] Fax: [phone]

## 2018-05-31 ENCOUNTER — Other Ambulatory Visit: Payer: Self-pay

## 2018-05-31 ENCOUNTER — Other Ambulatory Visit (HOSPITAL_COMMUNITY): Payer: Self-pay | Admitting: *Deleted

## 2018-05-31 NOTE — Patient Outreach (Signed)
[facility]) Care Management  05/31/2018  Jeffrey Campbell Jul 30, 1932 [id number]   EMMI-HF RED ON EMMI ALERT Day # 3 Date: 05/29/2018 Red Alert Reason: "New/worsening problems? Yes"   Outreach attempt #2 to patient. Spoke with patient who voices he is doing fine. He denies any acute issues or concerns at this time. RN CM reviewed and addressed red alert with patient. Patient denies any new and/or worsening symptoms. He voices he has been feeling a little better since he got home. He states he goes for MD follow up appt on tomorrow. No issues with transportation.RN CM confirmed with patient and spouse that all meds in the home and no issues or concerns regarding them. Spouse states that she is encouraging patient to weigh daily as he has not been doing so. RN CM advised spouse to record weight and BP daily to take to MD appts. Educated spouse on s/s of worsening condition and when to alert MD of wgt gain changes. She voiced understanding. Advised patient/spouse that they would continue to get automated EMMI- HF post discharge calls to assess how they are doing following recent hospitalization and will receive a call from a nurse if any of their responses were abnormal. They voiced understanding and was appreciative of f/u call.     Plan: RN CM will close case as no further interventions needed at this time.   Enzo Montgomery, RN,BSN,CCM [place] [facility] Telephonic Care Management Coordinator Direct Phone: [phone] Toll Free: [phone] Fax: [phone]

## 2018-06-01 ENCOUNTER — Ambulatory Visit (HOSPITAL_COMMUNITY)
Admission: RE | Admit: 2018-06-01 | Discharge: 2018-06-01 | Disposition: A | Discharge status: Home / Self Care | Payer: Medicare Other | Source: Ambulatory Visit | Attending: Nephrology | Admitting: Nephrology

## 2018-06-01 VITALS — BP 148/51 | HR 37 | Temp 98.6°F | Resp 20 | Ht 68.0 in | Wt 140.0 lb

## 2018-06-01 DIAGNOSIS — I130 Hypertensive heart and chronic kidney disease with heart failure and stage 1 through stage 4 chronic kidney disease, or unspecified chronic kidney disease: Secondary | ICD-10-CM | POA: Diagnosis not present

## 2018-06-01 LAB — POCT HEMOGLOBIN-HEMACUE: Hemoglobin: 8.1 g/dL — ABNORMAL LOW (ref 13.0–17.0)

## 2018-06-01 MED ORDER — EPOETIN ALFA 2000 UNIT/ML IJ SOLN
INTRAMUSCULAR | Status: AC
  Administered 2018-06-01: 2000 [IU]
  Filled 2018-06-01: qty 1

## 2018-06-01 MED ORDER — EPOETIN ALFA 3000 UNIT/ML IJ SOLN
INTRAMUSCULAR | Status: AC
  Administered 2018-06-01: 3000 [IU]
  Filled 2018-06-01: qty 1

## 2018-06-01 MED ORDER — SODIUM CHLORIDE 0.9 % IV SOLN
510.0000 mg | INTRAVENOUS | Status: DC
  Administered 2018-06-01: 510 mg via INTRAVENOUS
  Filled 2018-06-01: qty 510

## 2018-06-01 MED ORDER — EPOETIN ALFA 10000 UNIT/ML IJ SOLN: 5000.0000 [IU] | INTRAMUSCULAR | Status: DC

## 2018-06-01 NOTE — Discharge Instructions (Signed)

## 2018-06-08 ENCOUNTER — Other Ambulatory Visit: Payer: Self-pay

## 2018-06-08 ENCOUNTER — Ambulatory Visit (HOSPITAL_COMMUNITY)
Admission: RE | Admit: 2018-06-08 | Discharge: 2018-06-08 | Disposition: A | Discharge status: Home / Self Care | Payer: Medicare Other | Source: Ambulatory Visit | Attending: Nephrology | Admitting: Nephrology

## 2018-06-08 VITALS — BP 137/69 | HR 63 | Temp 97.6°F | Resp 20

## 2018-06-08 DIAGNOSIS — I130 Hypertensive heart and chronic kidney disease with heart failure and stage 1 through stage 4 chronic kidney disease, or unspecified chronic kidney disease: Secondary | ICD-10-CM | POA: Diagnosis not present

## 2018-06-08 DIAGNOSIS — D631 Anemia in chronic kidney disease: Secondary | ICD-10-CM | POA: Insufficient documentation

## 2018-06-08 DIAGNOSIS — N189 Chronic kidney disease, unspecified: Secondary | ICD-10-CM | POA: Insufficient documentation

## 2018-06-08 LAB — IRON AND TIBC
Iron: 41 ug/dL — ABNORMAL LOW (ref 45–182)
Saturation Ratios: 16 % — ABNORMAL LOW (ref 17.9–39.5)
TIBC: 256 ug/dL (ref 250–450)
UIBC: 215 ug/dL

## 2018-06-08 LAB — FERRITIN: Ferritin: 450 ng/mL — ABNORMAL HIGH (ref 24–336)

## 2018-06-08 LAB — POCT HEMOGLOBIN-HEMACUE: Hemoglobin: 9.5 g/dL — ABNORMAL LOW (ref 13.0–17.0)

## 2018-06-08 MED ORDER — EPOETIN ALFA 3000 UNIT/ML IJ SOLN
3000.0000 [IU] | Freq: Once | INTRAMUSCULAR | Status: AC
  Administered 2018-06-08: 3000 [IU] via SUBCUTANEOUS

## 2018-06-08 MED ORDER — EPOETIN ALFA 10000 UNIT/ML IJ SOLN: 5000.0000 [IU] | INTRAMUSCULAR | Status: DC

## 2018-06-08 MED ORDER — EPOETIN ALFA-EPBX 3000 UNIT/ML IJ SOLN
3000.0000 [IU] | Freq: Once | INTRAMUSCULAR | Status: DC
  Filled 2018-06-08: qty 1

## 2018-06-08 MED ORDER — EPOETIN ALFA 10000 UNIT/ML IJ SOLN: 5000.0000 [IU] | Freq: Once | INTRAMUSCULAR | Status: DC

## 2018-06-08 MED ORDER — SODIUM CHLORIDE 0.9 % IV SOLN
510.0000 mg | INTRAVENOUS | Status: DC
  Administered 2018-06-08: 510 mg via INTRAVENOUS
  Filled 2018-06-08: qty 17

## 2018-06-08 MED ORDER — EPOETIN ALFA-EPBX 2000 UNIT/ML IJ SOLN
2000.0000 [IU] | Freq: Once | INTRAMUSCULAR | Status: DC
  Filled 2018-06-08: qty 1

## 2018-06-08 MED ORDER — EPOETIN ALFA 2000 UNIT/ML IJ SOLN
2000.0000 [IU] | Freq: Once | INTRAMUSCULAR | Status: AC
  Administered 2018-06-08: 2000 [IU] via SUBCUTANEOUS

## 2018-06-14 ENCOUNTER — Other Ambulatory Visit: Payer: Self-pay

## 2018-06-14 ENCOUNTER — Telehealth: Payer: Self-pay | Admitting: *Deleted

## 2018-06-14 NOTE — Telephone Encounter (Addendum)
For patients that have been cancelled:   You will be contacted at a later time to reschedule. However, this will depend on ongoing evaluation of the Covid-19 situation.  Please call us if any new questions or concerns arise. Despite this difficult time, we are right here with you.  Routing to C19 CANCEL pool.

## 2018-06-15 ENCOUNTER — Telehealth: Payer: Self-pay

## 2018-06-15 ENCOUNTER — Ambulatory Visit (HOSPITAL_COMMUNITY)
Admission: RE | Admit: 2018-06-15 | Discharge: 2018-06-15 | Disposition: A | Discharge status: Home / Self Care | Payer: Medicare Other | Source: Ambulatory Visit | Attending: Nephrology | Admitting: Nephrology

## 2018-06-15 VITALS — BP 165/50 | HR 46 | Temp 97.6°F | Resp 20

## 2018-06-15 DIAGNOSIS — I130 Hypertensive heart and chronic kidney disease with heart failure and stage 1 through stage 4 chronic kidney disease, or unspecified chronic kidney disease: Secondary | ICD-10-CM | POA: Insufficient documentation

## 2018-06-15 MED ORDER — EPOETIN ALFA 10000 UNIT/ML IJ SOLN: 5000.0000 [IU] | INTRAMUSCULAR | Status: DC

## 2018-06-15 MED ORDER — EPOETIN ALFA 2000 UNIT/ML IJ SOLN
INTRAMUSCULAR | Status: AC
  Administered 2018-06-15: 2000 [IU]
  Filled 2018-06-15: qty 1

## 2018-06-15 MED ORDER — EPOETIN ALFA 3000 UNIT/ML IJ SOLN
INTRAMUSCULAR | Status: AC
  Administered 2018-06-15: 3000 [IU]
  Filled 2018-06-15: qty 1

## 2018-06-15 NOTE — Telephone Encounter (Signed)
-----   Message from Consuelo Pandy, [place] sent at 06/13/2018  4:04 PM EDT ----- Regarding: reschedule I've seen this pt several times, he is in his 1s and has multiple medical problems and is frail. I would highly encourage him to avoid coming out unless he absolutely has acute cardiac issues/symptoms that need attention. Otherwise, we should reschedule him in about 6 weeks.

## 2018-06-15 NOTE — Telephone Encounter (Signed)
-----   Message from Consuelo Pandy, [place] sent at 06/13/2018  4:04 PM EDT ----- Regarding: reschedule I've seen this pt several times, he is in his 54s and has multiple medical problems and is frail. I would highly encourage him to avoid coming out unless he absolutely has acute cardiac issues/symptoms that need attention. Otherwise, we should reschedule him in about 6 weeks.

## 2018-06-16 LAB — POCT HEMOGLOBIN-HEMACUE: HEMOGLOBIN: 9 g/dL — AB (ref 13.0–17.0)

## 2018-06-21 ENCOUNTER — Other Ambulatory Visit: Payer: Self-pay

## 2018-06-22 ENCOUNTER — Ambulatory Visit (HOSPITAL_COMMUNITY)
Admission: RE | Admit: 2018-06-22 | Discharge: 2018-06-22 | Disposition: A | Discharge status: Home / Self Care | Payer: Medicare Other | Source: Ambulatory Visit | Attending: Nephrology | Admitting: Nephrology

## 2018-06-22 ENCOUNTER — Encounter (HOSPITAL_COMMUNITY): Payer: Medicare Other

## 2018-06-22 ENCOUNTER — Ambulatory Visit: Payer: Medicare Other | Admitting: Cardiology

## 2018-06-22 VITALS — BP 173/59 | HR 44 | Temp 98.6°F | Resp 18 | Ht 69.0 in | Wt 140.0 lb

## 2018-06-22 DIAGNOSIS — I130 Hypertensive heart and chronic kidney disease with heart failure and stage 1 through stage 4 chronic kidney disease, or unspecified chronic kidney disease: Secondary | ICD-10-CM | POA: Insufficient documentation

## 2018-06-22 LAB — POCT HEMOGLOBIN-HEMACUE: Hemoglobin: 9.5 g/dL — ABNORMAL LOW (ref 13.0–17.0)

## 2018-06-22 MED ORDER — EPOETIN ALFA 2000 UNIT/ML IJ SOLN
INTRAMUSCULAR | Status: AC
  Administered 2018-06-22: 2000 [IU]
  Filled 2018-06-22: qty 1

## 2018-06-22 MED ORDER — EPOETIN ALFA 10000 UNIT/ML IJ SOLN: 5000.0000 [IU] | INTRAMUSCULAR | Status: DC

## 2018-06-22 MED ORDER — EPOETIN ALFA 3000 UNIT/ML IJ SOLN
INTRAMUSCULAR | Status: AC
  Administered 2018-06-22: 3000 [IU]
  Filled 2018-06-22: qty 1

## 2018-06-22 MED ORDER — SODIUM CHLORIDE 0.9 % IV SOLN
510.0000 mg | INTRAVENOUS | Status: DC
  Administered 2018-06-22: 510 mg via INTRAVENOUS
  Filled 2018-06-22: qty 510

## 2018-06-28 ENCOUNTER — Other Ambulatory Visit: Payer: Self-pay

## 2018-06-28 ENCOUNTER — Other Ambulatory Visit (HOSPITAL_COMMUNITY): Payer: Self-pay | Admitting: *Deleted

## 2018-06-29 ENCOUNTER — Ambulatory Visit (HOSPITAL_COMMUNITY)
Admission: RE | Admit: 2018-06-29 | Discharge: 2018-06-29 | Disposition: A | Discharge status: Home / Self Care | Payer: Medicare Other | Source: Ambulatory Visit | Attending: Nephrology | Admitting: Nephrology

## 2018-06-29 VITALS — BP 173/55 | HR 41 | Temp 98.6°F | Resp 20

## 2018-06-29 DIAGNOSIS — I130 Hypertensive heart and chronic kidney disease with heart failure and stage 1 through stage 4 chronic kidney disease, or unspecified chronic kidney disease: Secondary | ICD-10-CM

## 2018-06-29 LAB — POCT HEMOGLOBIN-HEMACUE: Hemoglobin: 11 g/dL — ABNORMAL LOW (ref 13.0–17.0)

## 2018-06-29 MED ORDER — EPOETIN ALFA 10000 UNIT/ML IJ SOLN: 5000.0000 [IU] | INTRAMUSCULAR | Status: DC

## 2018-06-29 MED ORDER — EPOETIN ALFA 2000 UNIT/ML IJ SOLN
INTRAMUSCULAR | Status: AC
  Administered 2018-06-29: 2000 [IU] via SUBCUTANEOUS
  Filled 2018-06-29: qty 1

## 2018-06-29 MED ORDER — EPOETIN ALFA 2000 UNIT/ML IJ SOLN
2000.0000 [IU] | INTRAMUSCULAR | Status: DC
  Administered 2018-06-29: 10:00:00 2000 [IU] via SUBCUTANEOUS

## 2018-06-29 MED ORDER — CLONIDINE HCL 0.1 MG PO TABS
ORAL_TABLET | ORAL | Status: AC
  Administered 2018-06-29: 0.1 mg via ORAL
  Filled 2018-06-29: qty 1

## 2018-06-29 MED ORDER — EPOETIN ALFA 3000 UNIT/ML IJ SOLN
INTRAMUSCULAR | Status: AC
  Administered 2018-06-29: 3000 [IU] via SUBCUTANEOUS
  Filled 2018-06-29: qty 1

## 2018-06-29 MED ORDER — EPOETIN ALFA 3000 UNIT/ML IJ SOLN
3000.0000 [IU] | INTRAMUSCULAR | Status: DC
  Administered 2018-06-29: 10:00:00 3000 [IU] via SUBCUTANEOUS

## 2018-06-29 MED ORDER — CLONIDINE HCL 0.1 MG PO TABS
0.1000 mg | ORAL_TABLET | Freq: Once | ORAL | Status: AC | PRN
  Administered 2018-06-29: 0.1 mg via ORAL

## 2018-06-29 MED ORDER — SODIUM CHLORIDE 0.9 % IV SOLN
510.0000 mg | Freq: Once | INTRAVENOUS | Status: AC
  Administered 2018-06-29: 510 mg via INTRAVENOUS
  Filled 2018-06-29: qty 510

## 2018-06-30 ENCOUNTER — Telehealth: Payer: Self-pay

## 2018-06-30 NOTE — Telephone Encounter (Signed)
Spoke to pt and his Wife. He agreed to do a telephone visit. He was advised to have BP, HR, weight and current medication list available prior to his appt time at 10:00.  Pt does not have MyChart and his Wife does not know how to use her phone well enough to download WebEx. She wasn't interested in me helping her with this.   YOUR CARDIOLOGY TEAM HAS ARRANGED FOR AN E-VISIT FOR YOUR APPOINTMENT - PLEASE REVIEW IMPORTANT INFORMATION BELOW SEVERAL DAYS PRIOR TO YOUR APPOINTMENT  Due to the recent COVID-19 pandemic, we are transitioning in-person office visits to tele-medicine visits in an effort to decrease unnecessary exposure to our patients and staff. Medicare and most insurances are covering these visits without a copay needed. We also encourage you to sign up for MyChart if you have not already done so. You will need a smartphone if possible. For patients that do not have this, we can still complete the visit using a regular telephone but do prefer a smartphone to enable video when possible. You may have a close family member that lives with you that can help. If possible, we also ask that you have a blood pressure cuff and scale at home to measure your blood pressure, heart rate and weight prior to your scheduled appointment. Patients with clinical needs that need an in-person evaluation and testing will still be able to come to the office if absolutely necessary. If you have any questions, feel free to call our office.    IF YOU HAVE A SMARTPHONE, PLEASE DOWNLOAD THE WEBEX APP TO YOUR SMARTPHONE  - If Apple, go to [employer] and type in WebEx in the search bar. [place] [employer], the blue/green circle. The app is free but as with any other app download, your phone may require you to verify saved payment information or Apple password. You do NOT have to create a WebEx account.  - If Android, go to [employer] and type in [employer] in the search bar. [place] [employer], the  blue/green circle. The app is free but as with any other app download, your phone may require you to verify saved payment information or Android password. You do NOT have to create a WebEx account.  It is very helpful to have this downloaded before your visit.    2-3 DAYS BEFORE YOUR APPOINTMENT  You will receive a telephone call from one of our [place] team members - your caller ID may say "Unknown caller." If this is a video visit, we will confirm that you have been able to download the WebEx app. We will remind you check your blood pressure, heart rate and weight prior to your scheduled appointment. If you have an Apple Watch or Kardia, please upload any pertinent ECG strips the day before or morning of your appointment to [facility]. Our staff will also make sure you have reviewed the consent and agree to move forward with your scheduled tele-health visit.     THE DAY OF YOUR APPOINTMENT  Approximately 15 minutes prior to your scheduled appointment, you will receive a telephone call from one of [facility] team - your caller ID may say "Unknown caller."  Our staff will confirm medications, vital signs for the day and any symptoms you may be experiencing. Please have this information available prior to the time of visit start. It may also be helpful for you to have a pad of paper and pen handy for any instructions given during your visit.  They will also walk you through joining the WebEx smartphone meeting if this is a video visit.    CONSENT FOR TELE-HEALTH VISIT - PLEASE REVIEW  I hereby voluntarily request, consent and authorize [facility] and its employed or contracted physicians, physician assistants, nurse practitioners or other licensed health care professionals (the Practitioner), to provide me with telemedicine health care services (the "Services") as deemed necessary by the treating Practitioner. I acknowledge and consent to receive the Services by the Practitioner via telemedicine.  I understand that the telemedicine visit will involve communicating with the Practitioner through live audiovisual communication technology and the disclosure of certain medical information by electronic transmission. I acknowledge that I have been given the opportunity to request an in-person assessment or other available alternative prior to the telemedicine visit and am voluntarily participating in the telemedicine visit.  I understand that I have the right to withhold or withdraw my consent to the use of telemedicine in the course of my care at any time, without affecting my right to future care or treatment, and that the Practitioner or I may terminate the telemedicine visit at any time. I understand that I have the right to inspect all information obtained and/or recorded in the course of the telemedicine visit and may receive copies of available information for a reasonable fee.  I understand that some of the potential risks of receiving the Services via telemedicine include:  Marland Kitchen Delay or interruption in medical evaluation due to technological equipment failure or disruption; . Information transmitted may not be sufficient (e.g. poor resolution of images) to allow for appropriate medical decision making by the Practitioner; and/or  . In rare instances, security protocols could fail, causing a breach of personal health information.  Furthermore, I acknowledge that it is my responsibility to provide information about my medical history, conditions and care that is complete and accurate to the best of my ability. I acknowledge that Practitioner's advice, recommendations, and/or decision may be based on factors not within their control, such as incomplete or inaccurate data provided by me or distortions of diagnostic images or specimens that may result from electronic transmissions. I understand that the practice of medicine is not an exact science and that Practitioner makes no warranties or guarantees  regarding treatment outcomes. I acknowledge that I will receive a copy of this consent concurrently upon execution via email to the email address I last provided but may also request a printed copy by calling the office of [facility].    I understand that my insurance will be billed for this visit.   I have read or had this consent read to me. . I understand the contents of this consent, which adequately explains the benefits and risks of the Services being provided via telemedicine.  . I have been provided ample opportunity to ask questions regarding this consent and the Services and have had my questions answered to my satisfaction. . I give my informed consent for the services to be provided through the use of telemedicine in my medical care  By participating in this telemedicine visit I agree to the above.

## 2018-07-04 ENCOUNTER — Encounter: Payer: Self-pay | Admitting: Cardiology

## 2018-07-04 ENCOUNTER — Other Ambulatory Visit: Payer: Self-pay

## 2018-07-04 ENCOUNTER — Telehealth (INDEPENDENT_AMBULATORY_CARE_PROVIDER_SITE_OTHER): Payer: Medicare Other | Admitting: Cardiology

## 2018-07-04 DIAGNOSIS — I6523 Occlusion and stenosis of bilateral carotid arteries: Secondary | ICD-10-CM

## 2018-07-04 DIAGNOSIS — I272 Pulmonary hypertension, unspecified: Secondary | ICD-10-CM | POA: Diagnosis not present

## 2018-07-04 DIAGNOSIS — I5032 Chronic diastolic (congestive) heart failure: Secondary | ICD-10-CM | POA: Diagnosis not present

## 2018-07-04 DIAGNOSIS — I130 Hypertensive heart and chronic kidney disease with heart failure and stage 1 through stage 4 chronic kidney disease, or unspecified chronic kidney disease: Secondary | ICD-10-CM | POA: Diagnosis not present

## 2018-07-04 DIAGNOSIS — R001 Bradycardia, unspecified: Secondary | ICD-10-CM | POA: Diagnosis not present

## 2018-07-04 DIAGNOSIS — I2510 Atherosclerotic heart disease of native coronary artery without angina pectoris: Secondary | ICD-10-CM

## 2018-07-04 DIAGNOSIS — N185 Chronic kidney disease, stage 5: Secondary | ICD-10-CM | POA: Diagnosis not present

## 2018-07-04 NOTE — Progress Notes (Addendum)
Virtual Visit attempted but due to technical difficulties this was changed to a Telephone Note   This visit type was conducted due to national recommendations for restrictions regarding the COVID-19 Pandemic (e.g. social distancing) in an effort to limit this patient's exposure and mitigate transmission in our community.  Due to his co-morbid illnesses, this patient is at least at moderate risk for complications without adequate follow up.  This format is felt to be most appropriate for this patient at this time.  The patient did not have access to video technology/had technical difficulties with video requiring transitioning to audio format only (telephone).  All issues noted in this document were discussed and addressed.  No physical exam could be performed with this format.  Please refer to the patient's chart for his  consent to telehealth for Jeffrey Campbell.  Evaluation Performed:  Follow-up visit  This visit type was conducted due to national recommendations for restrictions regarding the COVID-19 Pandemic (e.g. social distancing).  This format is felt to be most appropriate for this patient at this time.  All issues noted in this document were discussed and addressed.  No physical exam was performed (except for noted visual exam findings with Video Visits).  Please refer to the patient's chart (MyChart message for video visits and phone note for telephone visits) for the patient's consent to telehealth for [facility].  Date:  07/04/2018   ID:  [name], DOB Oct 19, 1932, MRN [id number]  Patient Location:  Home  Provider location:   [place]  PCP:  [name], MD  Cardiologist:  Fransico Him, MD  Electrophysiologist:  None   Chief Complaint:  followup of CAD, HTN, lipids, MVP  History of Present Illness:    Jeffrey Campbell is a 85 y.o. male who presents via audio/video conferencing for a telehealth visit today.    This is a 85 y.o.malewith a hx of ASCAD ([place] 2012  with 50-60% ostial LAD, 50-60%mLAD, 60% pRCA), HTN, dyslipidemia, mild MR with posterior MVP, bradycardia and moderate pulmonary HTN (followed by Dr. Aundra Dubin).He has had an extensive w/u for pulmonary HTN (Group 64from pulmonary venous HTN in setting of elevated left heart pressure) with PFTs showing mild COPD but moderate to severely reduced DLCO, neg VQ scan for PE and chest CT with no interstitial lung disease. He does have OSA but is intolerant to CPAP. He also has bilateral renal artery stenosis, mesenteric artery stenosis and distal aortic stenosis followed by Dr. Fletcher Anon.[place] 01/2015 showed mild pulmonary venous HTN with prominent V waves in the PCWP and RA. 2D echo showed mild MR with MVP so prominent V waves felt to be due to stiff ventricles/diastolic dysfunction in setting of poorly controlled HTN. He has chronic diastolic CHF.  Last echocardiogram was in June 2019 showed normal left ventricular EF, mild MR and moderate PASP 54mmHg.  He is here today for followup and is doing well.  He denies any chest pain or pressure, SOB, DOE, PND, orthopnea, LE edema, dizziness, palpitations or syncope. He is compliant with his meds and is tolerating meds with no SE.    The patient does not have symptoms concerning for COVID-19 infection (fever, chills, cough, or new shortness of breath).    Prior CV studies:   The following studies were reviewed today:  2D echo 08/30/2017 showing normal LVF with EF 60-65% with moderate LVH and moderate pulmonary HTN  Past Medical History:  Diagnosis Date  . Anemia    low iron  . BPH (benign  prostatic hyperplasia)   . CAD (coronary artery disease)   . Carotid artery stenosis    1-39% bilateral carotid artery stenosis and < 50% stenosis in the right CCA by dopplers 06/2017  . CKD (chronic kidney disease), stage III (HCC)    Stage 4  . Diastolic dysfunction   . Glaucoma   . Graves disease   . Heart murmur, systolic   . History of ETOH abuse   . History of  PFTs 05/2010   moderate airflow obstruction w reduced DLCO by PFT   . Hyperlipemia   . Hypertension   . Hyperthyroidism 08/26/10   radioactive iodine therapy   . Mitral regurgitation echo 2015   mild  . Multiple thyroid nodules   . MVP (mitral valve prolapse) 11/2012   posterior MVP  . Organic impotence   . OSA (obstructive sleep apnea)    upper airway resistance syndrome with RDI 18/hr - not on CPAP due to insurance not covering  . Pre-diabetes   . PUD (peptic ulcer disease)   . Pulmonary hypertension ([place]) echo 2015   Group 2 with pulmonary venous HTN and Group 3 with OSA  . Shutdown renal    after cardiac cath  . Upper airway resistance syndrome    Past Surgical History:  Procedure Laterality Date  . APPENDECTOMY    . AV FISTULA PLACEMENT Left 10/18/2017   Procedure: ARTERIOVENOUS (AV) FISTULA CREATION ARM;  Surgeon: Waynetta Sandy, MD;  Location: [facility];  Service: Vascular;  Laterality: Left;  . CARDIAC CATHETERIZATION    . CARDIAC CATHETERIZATION N/A 02/17/2015   Procedure: Right Heart Cath;  Surgeon: Larey Dresser, MD;  Location: [place] [facility];  Service: Cardiovascular;  Laterality: N/A;  . ESOPHAGOGASTRODUODENOSCOPY (EGD) WITH PROPOFOL Left 10/16/2016   Procedure: ESOPHAGOGASTRODUODENOSCOPY (EGD) WITH PROPOFOL;  Surgeon: Ronnette Juniper, MD;  Location: [facility];  Service: Gastroenterology;  Laterality: Left;  . heart catherization    . HERNIA REPAIR  10/2009  . IR RADIOLOGIST EVAL & MGMT  09/28/2016  . IR RADIOLOGIST EVAL & MGMT  10/19/2016  . IR RADIOLOGIST EVAL & MGMT  01/18/2017  . RIGHT HEART CATH N/A 07/28/2016   Procedure: Right Heart Cath;  Surgeon: Larey Dresser, MD;  Location: [place] [facility];  Service: Cardiovascular;  Laterality: N/A;     Current Meds  Medication Sig  . aspirin EC 81 MG tablet Take 81 mg by mouth daily.  Marland Kitchen atorvastatin (LIPITOR) 20 MG tablet TAKE 1 TABLET BY MOUTH EVERY DAY  . Brinzolamide-Brimonidine (SIMBRINZA) 1-0.2 % SUSP  Place 1 drop into both eyes 2 (two) times daily.  . calcitRIOL (ROCALTROL) 0.25 MCG capsule Take 0.25 mcg by mouth daily.  Marland Kitchen doxazosin (CARDURA) 4 MG tablet Take 4 mg by mouth daily.  Marland Kitchen Epoetin Alfa (PROCRIT IJ) Inject 5,000 Units as directed every 7 (seven) days.  . feeding supplement, ENSURE ENLIVE, (ENSURE ENLIVE) LIQD Take 237 mLs by mouth 2 (two) times daily between meals.  . furosemide (LASIX) 40 MG tablet Take 40 mg by mouth 2 (two) times daily.  . hydrALAZINE (APRESOLINE) 100 MG tablet Take 1 tablet (100 mg total) by mouth 3 (three) times daily. Please make yearly appt with Dr. Radford Pax for October. 1st attempt  . levothyroxine (SYNTHROID, LEVOTHROID) 150 MCG tablet Take 150 mcg by mouth daily before breakfast.  . losartan (COZAAR) 50 MG tablet Take 50 mg by mouth daily.  . Multiple Vitamin (MULTIVITAMIN WITH MINERALS) TABS tablet Take 1 tablet by mouth  daily.     Allergies:   Iodinated diagnostic agents; Zocor [simvastatin]; Budesonide-formoterol fumarate; Minoxidil; Tiotropium bromide monohydrate; and Tricor [fenofibrate]   Social History   Tobacco Use  . Smoking status: Former Smoker    Packs/day: 2.00    Years: 40.00    Pack years: 80.00    Types: Cigarettes    Last attempt to quit: 03/29/1984    Years since quitting: 34.2  . Smokeless tobacco: Never Used  Substance Use Topics  . Alcohol use: No    Comment: quit in 1981  . Drug use: No     Family Hx: The patient's family history includes Colon cancer in his brother, maternal uncle, and mother; Hypertension in his father; Kidney failure in his father; Lung disease in his brother.  ROS:   Please see the history of present illness.     All other systems reviewed and are negative.   Labs/Other Tests and Data Reviewed:    Recent Labs: 09/06/2017: NT-Pro BNP 2,000 05/20/2018: ALT 14; B Natriuretic Peptide 3,913.9; TSH 1.029 05/23/2018: BUN 130; Creatinine, Ser 4.90; Platelets 142; Potassium 3.9; Sodium 138 06/29/2018:  Hemoglobin 11.0   Recent Lipid Panel Lab Results  Component Value Date/Time   CHOL 117 06/27/2017 09:25 AM   TRIG 45 06/27/2017 09:25 AM   HDL 60 06/27/2017 09:25 AM   CHOLHDL 2.0 06/27/2017 09:25 AM   CHOLHDL 1.9 01/20/2016 09:09 AM   LDLCALC 48 06/27/2017 09:25 AM    Wt Readings from Last 3 Encounters:  07/04/18 135 lb (61.2 kg)  06/22/18 140 lb (63.5 kg)  06/01/18 140 lb (63.5 kg)     Objective:    Vital Signs:  BP (!) 149/50   Pulse (!) 43   Ht 5\' 9"  (1.753 m)   Wt 135 lb (61.2 kg)   BMI 19.94 kg/m    Due to technical difficulties with virtual visit it was changed to telephone visit and Physical exam could not be performed  ASSESSMENT & PLAN:    1.  ASCAD - [place] 2012 with 50-60% ostial LAD, 50-60%mLAD, 60% pRCA.  He denies any anginal symptoms.  He will continue on ASA 81mg  daily and statin.   2.  Chronic diastolic heart failure -he checks his weight at home and it has been stable at 185lbs.  He has not had any shortness of breath, PND, orthopnea or lower extremity edema.  He will continue on Lasix 40 mg twice daily.  3.  Pulmonary hypertension - He has had an extensive w/u for pulmonary HTN (Group 23from pulmonary venous HTN in setting of elevated left heart pressure) with PFTs showing mild COPD but moderate to severely reduced DLCO, neg VQ scan for PE and chest CT with no interstitial lung disease. He does have OSA but is intolerant to CPAP. Pulmonary artery pressure hadincreased since prior study at 88 mmHg and he underwentright heart catheterization with Dr. Olevia Perches mildly elevated right and left heart pressures with moderate PHTN with PVR 1.74 WU and PCW 46mmHg c/w pulmonary venous HTN with a component of Type 3 from OSA and not a candidate for pulmonary vasodilators. PA systolic pressure had increased on last echo 08/30/2017  with PASP 31mmHg.  This is likely related to pulmonary venous hypertension from his chronic diastolic heart failure.    He will continue  on Lasix 40 mg twice daily.  He has chronic kidney disease with creatinine ranging  around 4.8.  4.  Bilateral carotid artery stenosis -carotid Dopplers 07/11/2017 showed 1 to 39%  bilateral carotid stenosis with less than 50% stenosis of the right CCA.   He will continue on aspirin and statin therapy  5  Hypertension -  his BP is borderline elevated today at 149/50 mmHg.  He says for the most part his blood pressures controlled at home when he checks it 176-160'V systolic.  He will continue on amlodipine 10mg , hydralazine 100 mg 3 times daily, doxazosin 4 mg daily and losartan 50 mg daily.  6.  Hyperlipidemia  - LDL goal less than 70.  His LDL was at goal at 48 on 06/27/2017 and ALT was normal at 11.  Due to the Covid crisis I will not repeat the lipids at this time.  I will wait until summer to check his lipids and ALT.  For now he will continue on Lipitor 20 mg daily.  7.  Chronic kidney disease stage IV -he is followed by nephrology and creatinine is running around 4.8.   8.  Bradycardia - he is completely asymptomatic.  His heart rate normally runs in the 40s.  He has not had any dizziness, presyncope, syncope, weakness or fatigue.  He is not on any rate suppressing drugs.  COVID-19 Education: The signs and symptoms of COVID-19 were discussed with the patient and how to seek care for testing (follow up with PCP or arrange E-visit).  The importance of social distancing was discussed today.  Patient Risk:   After full review of this patient's clinical status, I feel that they are at least moderate risk at this time.  Time:   Today, I have spent 25 minutes with the patient reviewing chart and discussing medical problems including Bradycardia, CHF, pulmonary HTN, lipids and CKD and reviewing symptoms of COVID 19 and the ways to protect against contracting the virus with telehealth technology.      Medication Adjustments/Labs and Tests Ordered: Current medicines are reviewed at length with the  patient today.  Concerns regarding medicines are outlined above.  Tests Ordered: No orders of the defined types were placed in this encounter.  Medication Changes: No orders of the defined types were placed in this encounter.   Disposition:  Follow up in 6 month(s)  Signed, Fransico Him, MD  07/04/2018 9:47 AM    [facility]

## 2018-07-04 NOTE — Patient Instructions (Signed)
Medication Instructions:  Your physician recommends that you continue on your current medications as directed. Please refer to the Current Medication list given to you today.  If you need a refill on your cardiac medications before your next appointment, please call your pharmacy.   Lab work: None If you have labs (blood work) drawn today and your tests are completely normal, you will receive your results only by: . MyChart Message (if you have MyChart) OR . A paper copy in the mail If you have any lab test that is abnormal or we need to change your treatment, we will call you to review the results.  Testing/Procedures: None  Follow-Up: At [facility], you and your health needs are our priority.  As part of our continuing mission to provide you with exceptional heart care, we have created designated Provider Care Teams.  These Care Teams include your primary Cardiologist (physician) and Advanced Practice Providers (APPs -  Physician Assistants and Nurse Practitioners) who all work together to provide you with the care you need, when you need it. You will need a follow up appointment in 6 months.  Please call our office 2 months in advance to schedule this appointment.  You may see Traci Turner, MD or one of the following Advanced Practice Providers on your designated Care Team:   Brittainy Simmons, PA-C Dayna Dunn, PA-C . Michele Lenze, PA-C     

## 2018-07-06 ENCOUNTER — Other Ambulatory Visit: Payer: Self-pay

## 2018-07-06 ENCOUNTER — Ambulatory Visit (HOSPITAL_COMMUNITY)
Admission: RE | Admit: 2018-07-06 | Discharge: 2018-07-06 | Disposition: A | Discharge status: Home / Self Care | Payer: Medicare Other | Source: Ambulatory Visit | Attending: Nephrology | Admitting: Nephrology

## 2018-07-06 VITALS — BP 146/41 | HR 46 | Temp 98.0°F | Resp 20

## 2018-07-06 DIAGNOSIS — D631 Anemia in chronic kidney disease: Secondary | ICD-10-CM | POA: Insufficient documentation

## 2018-07-06 DIAGNOSIS — I130 Hypertensive heart and chronic kidney disease with heart failure and stage 1 through stage 4 chronic kidney disease, or unspecified chronic kidney disease: Secondary | ICD-10-CM | POA: Insufficient documentation

## 2018-07-06 DIAGNOSIS — N184 Chronic kidney disease, stage 4 (severe): Secondary | ICD-10-CM | POA: Insufficient documentation

## 2018-07-06 DIAGNOSIS — I509 Heart failure, unspecified: Secondary | ICD-10-CM | POA: Insufficient documentation

## 2018-07-06 LAB — IRON AND TIBC
Iron: 58 ug/dL (ref 45–182)
Saturation Ratios: 24 % (ref 17.9–39.5)
TIBC: 244 ug/dL — ABNORMAL LOW (ref 250–450)
UIBC: 186 ug/dL

## 2018-07-06 LAB — FERRITIN: Ferritin: 777 ng/mL — ABNORMAL HIGH (ref 24–336)

## 2018-07-06 LAB — POCT HEMOGLOBIN-HEMACUE: Hemoglobin: 10.3 g/dL — ABNORMAL LOW (ref 13.0–17.0)

## 2018-07-06 MED ORDER — EPOETIN ALFA 10000 UNIT/ML IJ SOLN: 5000.0000 [IU] | INTRAMUSCULAR | Status: DC

## 2018-07-06 MED ORDER — EPOETIN ALFA 2000 UNIT/ML IJ SOLN
INTRAMUSCULAR | Status: AC
  Administered 2018-07-06: 2000 [IU] via SUBCUTANEOUS
  Filled 2018-07-06: qty 1

## 2018-07-06 MED ORDER — EPOETIN ALFA 3000 UNIT/ML IJ SOLN
INTRAMUSCULAR | Status: AC
  Administered 2018-07-06: 3000 [IU] via SUBCUTANEOUS
  Filled 2018-07-06: qty 1

## 2018-07-12 ENCOUNTER — Other Ambulatory Visit (HOSPITAL_COMMUNITY): Payer: Self-pay | Admitting: *Deleted

## 2018-07-12 ENCOUNTER — Other Ambulatory Visit: Payer: Self-pay

## 2018-07-13 ENCOUNTER — Ambulatory Visit (HOSPITAL_COMMUNITY)
Admission: RE | Admit: 2018-07-13 | Discharge: 2018-07-13 | Disposition: A | Discharge status: Home / Self Care | Payer: Medicare Other | Source: Ambulatory Visit | Attending: Nephrology | Admitting: Nephrology

## 2018-07-13 VITALS — BP 152/49 | HR 48 | Temp 97.8°F | Resp 20

## 2018-07-13 DIAGNOSIS — I130 Hypertensive heart and chronic kidney disease with heart failure and stage 1 through stage 4 chronic kidney disease, or unspecified chronic kidney disease: Secondary | ICD-10-CM | POA: Diagnosis not present

## 2018-07-13 LAB — POCT HEMOGLOBIN-HEMACUE: Hemoglobin: 11.1 g/dL — ABNORMAL LOW (ref 13.0–17.0)

## 2018-07-13 MED ORDER — EPOETIN ALFA 10000 UNIT/ML IJ SOLN
10000.0000 [IU] | INTRAMUSCULAR | Status: DC
  Administered 2018-07-13: 10:00:00 10000 [IU] via SUBCUTANEOUS

## 2018-07-13 MED ORDER — EPOETIN ALFA 10000 UNIT/ML IJ SOLN
INTRAMUSCULAR | Status: AC
  Administered 2018-07-13: 10000 [IU] via SUBCUTANEOUS
  Filled 2018-07-13: qty 1

## 2018-07-26 ENCOUNTER — Other Ambulatory Visit: Payer: Self-pay

## 2018-07-27 ENCOUNTER — Ambulatory Visit: Payer: Medicare Other | Admitting: Neurology

## 2018-07-27 ENCOUNTER — Encounter (HOSPITAL_COMMUNITY)
Admission: RE | Admit: 2018-07-27 | Discharge: 2018-07-27 | Disposition: A | Discharge status: Home / Self Care | Payer: Medicare Other | Source: Ambulatory Visit | Attending: Nephrology | Admitting: Nephrology

## 2018-07-27 VITALS — BP 178/61 | HR 44 | Temp 98.3°F | Resp 20

## 2018-07-27 DIAGNOSIS — I130 Hypertensive heart and chronic kidney disease with heart failure and stage 1 through stage 4 chronic kidney disease, or unspecified chronic kidney disease: Secondary | ICD-10-CM | POA: Diagnosis not present

## 2018-07-27 LAB — POCT HEMOGLOBIN-HEMACUE: Hemoglobin: 11.7 g/dL — ABNORMAL LOW (ref 13.0–17.0)

## 2018-07-27 MED ORDER — EPOETIN ALFA 10000 UNIT/ML IJ SOLN
10000.0000 [IU] | INTRAMUSCULAR | Status: DC
  Administered 2018-07-27: 10000 [IU] via SUBCUTANEOUS

## 2018-07-27 MED ORDER — EPOETIN ALFA 10000 UNIT/ML IJ SOLN
INTRAMUSCULAR | Status: AC
  Filled 2018-07-27: qty 1

## 2018-08-09 ENCOUNTER — Other Ambulatory Visit: Payer: Self-pay

## 2018-08-10 ENCOUNTER — Other Ambulatory Visit: Payer: Self-pay

## 2018-08-10 ENCOUNTER — Ambulatory Visit (HOSPITAL_COMMUNITY)
Admission: RE | Admit: 2018-08-10 | Discharge: 2018-08-10 | Disposition: A | Discharge status: Home / Self Care | Payer: Medicare Other | Source: Ambulatory Visit | Attending: Nephrology | Admitting: Nephrology

## 2018-08-10 VITALS — BP 158/47 | HR 44 | Temp 98.0°F | Resp 20

## 2018-08-10 DIAGNOSIS — I130 Hypertensive heart and chronic kidney disease with heart failure and stage 1 through stage 4 chronic kidney disease, or unspecified chronic kidney disease: Secondary | ICD-10-CM

## 2018-08-10 DIAGNOSIS — D631 Anemia in chronic kidney disease: Secondary | ICD-10-CM | POA: Insufficient documentation

## 2018-08-10 DIAGNOSIS — N184 Chronic kidney disease, stage 4 (severe): Secondary | ICD-10-CM | POA: Insufficient documentation

## 2018-08-10 LAB — FERRITIN: Ferritin: 461 ng/mL — ABNORMAL HIGH (ref 24–336)

## 2018-08-10 LAB — IRON AND TIBC
Iron: 82 ug/dL (ref 45–182)
Saturation Ratios: 33 % (ref 17.9–39.5)
TIBC: 251 ug/dL (ref 250–450)
UIBC: 169 ug/dL

## 2018-08-10 LAB — POCT HEMOGLOBIN-HEMACUE: Hemoglobin: 11.1 g/dL — ABNORMAL LOW (ref 13.0–17.0)

## 2018-08-10 MED ORDER — EPOETIN ALFA 10000 UNIT/ML IJ SOLN
INTRAMUSCULAR | Status: AC
  Filled 2018-08-10: qty 1

## 2018-08-10 MED ORDER — EPOETIN ALFA 10000 UNIT/ML IJ SOLN
10000.0000 [IU] | INTRAMUSCULAR | Status: DC
  Administered 2018-08-10: 10000 [IU] via SUBCUTANEOUS

## 2018-08-23 DIAGNOSIS — K219 Gastro-esophageal reflux disease without esophagitis: Secondary | ICD-10-CM | POA: Diagnosis not present

## 2018-08-23 DIAGNOSIS — Z9889 Other specified postprocedural states: Secondary | ICD-10-CM | POA: Diagnosis not present

## 2018-08-23 DIAGNOSIS — N400 Enlarged prostate without lower urinary tract symptoms: Secondary | ICD-10-CM | POA: Diagnosis not present

## 2018-08-23 DIAGNOSIS — Z8719 Personal history of other diseases of the digestive system: Secondary | ICD-10-CM | POA: Diagnosis not present

## 2018-08-23 DIAGNOSIS — N184 Chronic kidney disease, stage 4 (severe): Secondary | ICD-10-CM | POA: Diagnosis not present

## 2018-08-23 DIAGNOSIS — N2581 Secondary hyperparathyroidism of renal origin: Secondary | ICD-10-CM | POA: Diagnosis not present

## 2018-08-23 DIAGNOSIS — I2510 Atherosclerotic heart disease of native coronary artery without angina pectoris: Secondary | ICD-10-CM | POA: Diagnosis not present

## 2018-08-23 DIAGNOSIS — N189 Chronic kidney disease, unspecified: Secondary | ICD-10-CM | POA: Diagnosis not present

## 2018-08-23 DIAGNOSIS — I272 Pulmonary hypertension, unspecified: Secondary | ICD-10-CM | POA: Diagnosis not present

## 2018-08-23 DIAGNOSIS — I129 Hypertensive chronic kidney disease with stage 1 through stage 4 chronic kidney disease, or unspecified chronic kidney disease: Secondary | ICD-10-CM | POA: Diagnosis not present

## 2018-08-23 DIAGNOSIS — J449 Chronic obstructive pulmonary disease, unspecified: Secondary | ICD-10-CM | POA: Diagnosis not present

## 2018-08-23 DIAGNOSIS — D631 Anemia in chronic kidney disease: Secondary | ICD-10-CM | POA: Diagnosis not present

## 2018-08-23 DIAGNOSIS — I5030 Unspecified diastolic (congestive) heart failure: Secondary | ICD-10-CM | POA: Diagnosis not present

## 2018-08-24 ENCOUNTER — Ambulatory Visit (HOSPITAL_COMMUNITY)
Admission: RE | Admit: 2018-08-24 | Discharge: 2018-08-24 | Disposition: A | Discharge status: Home / Self Care | Payer: Medicare Other | Source: Ambulatory Visit | Attending: Nephrology | Admitting: Nephrology

## 2018-08-24 ENCOUNTER — Other Ambulatory Visit: Payer: Self-pay

## 2018-08-24 VITALS — BP 153/50 | HR 42 | Temp 98.2°F | Resp 20

## 2018-08-24 DIAGNOSIS — I130 Hypertensive heart and chronic kidney disease with heart failure and stage 1 through stage 4 chronic kidney disease, or unspecified chronic kidney disease: Secondary | ICD-10-CM | POA: Insufficient documentation

## 2018-08-24 LAB — POCT HEMOGLOBIN-HEMACUE: Hemoglobin: 10.7 g/dL — ABNORMAL LOW (ref 13.0–17.0)

## 2018-08-24 MED ORDER — EPOETIN ALFA 10000 UNIT/ML IJ SOLN
INTRAMUSCULAR | Status: AC
  Filled 2018-08-24: qty 1

## 2018-08-24 MED ORDER — EPOETIN ALFA 10000 UNIT/ML IJ SOLN
10000.0000 [IU] | INTRAMUSCULAR | Status: DC
  Administered 2018-08-24: 10000 [IU] via SUBCUTANEOUS

## 2018-09-01 ENCOUNTER — Other Ambulatory Visit: Payer: Self-pay

## 2018-09-01 ENCOUNTER — Encounter (HOSPITAL_COMMUNITY): Payer: Self-pay | Admitting: *Deleted

## 2018-09-01 ENCOUNTER — Emergency Department (HOSPITAL_COMMUNITY): Payer: Medicare Other

## 2018-09-01 ENCOUNTER — Inpatient Hospital Stay (HOSPITAL_COMMUNITY)
Admission: EM | Admit: 2018-09-01 | Discharge: 2018-09-03 | DRG: 884 | Disposition: A | Discharge status: Home Health | Payer: Medicare Other | Attending: Internal Medicine | Admitting: Internal Medicine

## 2018-09-01 DIAGNOSIS — I132 Hypertensive heart and chronic kidney disease with heart failure and with stage 5 chronic kidney disease, or end stage renal disease: Secondary | ICD-10-CM | POA: Diagnosis not present

## 2018-09-01 DIAGNOSIS — I2510 Atherosclerotic heart disease of native coronary artery without angina pectoris: Secondary | ICD-10-CM | POA: Diagnosis present

## 2018-09-01 DIAGNOSIS — Z515 Encounter for palliative care: Secondary | ICD-10-CM | POA: Diagnosis not present

## 2018-09-01 DIAGNOSIS — R410 Disorientation, unspecified: Secondary | ICD-10-CM | POA: Diagnosis not present

## 2018-09-01 DIAGNOSIS — Z8249 Family history of ischemic heart disease and other diseases of the circulatory system: Secondary | ICD-10-CM

## 2018-09-01 DIAGNOSIS — D696 Thrombocytopenia, unspecified: Secondary | ICD-10-CM | POA: Diagnosis present

## 2018-09-01 DIAGNOSIS — Z20828 Contact with and (suspected) exposure to other viral communicable diseases: Secondary | ICD-10-CM | POA: Diagnosis not present

## 2018-09-01 DIAGNOSIS — N185 Chronic kidney disease, stage 5: Secondary | ICD-10-CM

## 2018-09-01 DIAGNOSIS — J449 Chronic obstructive pulmonary disease, unspecified: Secondary | ICD-10-CM | POA: Diagnosis present

## 2018-09-01 DIAGNOSIS — N400 Enlarged prostate without lower urinary tract symptoms: Secondary | ICD-10-CM | POA: Diagnosis present

## 2018-09-01 DIAGNOSIS — H401113 Primary open-angle glaucoma, right eye, severe stage: Secondary | ICD-10-CM | POA: Diagnosis present

## 2018-09-01 DIAGNOSIS — Z1159 Encounter for screening for other viral diseases: Secondary | ICD-10-CM | POA: Diagnosis not present

## 2018-09-01 DIAGNOSIS — J988 Other specified respiratory disorders: Secondary | ICD-10-CM | POA: Diagnosis present

## 2018-09-01 DIAGNOSIS — I120 Hypertensive chronic kidney disease with stage 5 chronic kidney disease or end stage renal disease: Secondary | ICD-10-CM | POA: Diagnosis not present

## 2018-09-01 DIAGNOSIS — Z841 Family history of disorders of kidney and ureter: Secondary | ICD-10-CM

## 2018-09-01 DIAGNOSIS — Z79890 Hormone replacement therapy (postmenopausal): Secondary | ICD-10-CM

## 2018-09-01 DIAGNOSIS — R4020 Unspecified coma: Secondary | ICD-10-CM | POA: Diagnosis not present

## 2018-09-01 DIAGNOSIS — I4430 Unspecified atrioventricular block: Secondary | ICD-10-CM | POA: Diagnosis not present

## 2018-09-01 DIAGNOSIS — Z7189 Other specified counseling: Secondary | ICD-10-CM

## 2018-09-01 DIAGNOSIS — H401121 Primary open-angle glaucoma, left eye, mild stage: Secondary | ICD-10-CM | POA: Diagnosis present

## 2018-09-01 DIAGNOSIS — E870 Hyperosmolality and hypernatremia: Secondary | ICD-10-CM | POA: Diagnosis not present

## 2018-09-01 DIAGNOSIS — R680 Hypothermia, not associated with low environmental temperature: Secondary | ICD-10-CM | POA: Diagnosis present

## 2018-09-01 DIAGNOSIS — I5032 Chronic diastolic (congestive) heart failure: Secondary | ICD-10-CM | POA: Diagnosis not present

## 2018-09-01 DIAGNOSIS — E785 Hyperlipidemia, unspecified: Secondary | ICD-10-CM | POA: Diagnosis present

## 2018-09-01 DIAGNOSIS — Z800 Family history of malignant neoplasm of digestive organs: Secondary | ICD-10-CM

## 2018-09-01 DIAGNOSIS — E860 Dehydration: Secondary | ICD-10-CM | POA: Diagnosis present

## 2018-09-01 DIAGNOSIS — G9349 Other encephalopathy: Secondary | ICD-10-CM | POA: Diagnosis present

## 2018-09-01 DIAGNOSIS — R531 Weakness: Secondary | ICD-10-CM

## 2018-09-01 DIAGNOSIS — F0151 Vascular dementia with behavioral disturbance: Secondary | ICD-10-CM | POA: Diagnosis not present

## 2018-09-01 DIAGNOSIS — T68XXXA Hypothermia, initial encounter: Secondary | ICD-10-CM | POA: Diagnosis not present

## 2018-09-01 DIAGNOSIS — R001 Bradycardia, unspecified: Secondary | ICD-10-CM | POA: Diagnosis not present

## 2018-09-01 DIAGNOSIS — Z888 Allergy status to other drugs, medicaments and biological substances status: Secondary | ICD-10-CM

## 2018-09-01 DIAGNOSIS — E872 Acidosis: Secondary | ICD-10-CM | POA: Diagnosis present

## 2018-09-01 DIAGNOSIS — G9340 Encephalopathy, unspecified: Secondary | ICD-10-CM | POA: Diagnosis not present

## 2018-09-01 DIAGNOSIS — H409 Unspecified glaucoma: Secondary | ICD-10-CM | POA: Diagnosis present

## 2018-09-01 DIAGNOSIS — I272 Pulmonary hypertension, unspecified: Secondary | ICD-10-CM | POA: Diagnosis present

## 2018-09-01 DIAGNOSIS — E039 Hypothyroidism, unspecified: Secondary | ICD-10-CM | POA: Diagnosis present

## 2018-09-01 DIAGNOSIS — R778 Other specified abnormalities of plasma proteins: Secondary | ICD-10-CM

## 2018-09-01 DIAGNOSIS — Z79899 Other long term (current) drug therapy: Secondary | ICD-10-CM

## 2018-09-01 DIAGNOSIS — D539 Nutritional anemia, unspecified: Secondary | ICD-10-CM | POA: Diagnosis present

## 2018-09-01 DIAGNOSIS — Z87891 Personal history of nicotine dependence: Secondary | ICD-10-CM

## 2018-09-01 DIAGNOSIS — G4733 Obstructive sleep apnea (adult) (pediatric): Secondary | ICD-10-CM | POA: Diagnosis present

## 2018-09-01 DIAGNOSIS — E1122 Type 2 diabetes mellitus with diabetic chronic kidney disease: Secondary | ICD-10-CM | POA: Diagnosis present

## 2018-09-01 DIAGNOSIS — E119 Type 2 diabetes mellitus without complications: Secondary | ICD-10-CM

## 2018-09-01 DIAGNOSIS — I959 Hypotension, unspecified: Secondary | ICD-10-CM | POA: Diagnosis not present

## 2018-09-01 DIAGNOSIS — R404 Transient alteration of awareness: Secondary | ICD-10-CM | POA: Diagnosis not present

## 2018-09-01 DIAGNOSIS — I499 Cardiac arrhythmia, unspecified: Secondary | ICD-10-CM | POA: Diagnosis not present

## 2018-09-01 DIAGNOSIS — I440 Atrioventricular block, first degree: Secondary | ICD-10-CM | POA: Diagnosis not present

## 2018-09-01 DIAGNOSIS — Z7982 Long term (current) use of aspirin: Secondary | ICD-10-CM

## 2018-09-01 DIAGNOSIS — Z91041 Radiographic dye allergy status: Secondary | ICD-10-CM

## 2018-09-01 DIAGNOSIS — R0989 Other specified symptoms and signs involving the circulatory and respiratory systems: Secondary | ICD-10-CM | POA: Diagnosis not present

## 2018-09-01 DIAGNOSIS — Z8711 Personal history of peptic ulcer disease: Secondary | ICD-10-CM

## 2018-09-01 DIAGNOSIS — F01518 Vascular dementia, unspecified severity, with other behavioral disturbance: Secondary | ICD-10-CM | POA: Diagnosis present

## 2018-09-01 LAB — CBG MONITORING, ED: Glucose-Capillary: 141 mg/dL — ABNORMAL HIGH (ref 70–99)

## 2018-09-01 LAB — CBC
HCT: 34.9 % — ABNORMAL LOW (ref 39.0–52.0)
Hemoglobin: 11 g/dL — ABNORMAL LOW (ref 13.0–17.0)
MCH: 31.6 pg (ref 26.0–34.0)
MCHC: 31.5 g/dL (ref 30.0–36.0)
MCV: 100.3 fL — ABNORMAL HIGH (ref 80.0–100.0)
Platelets: 132 10*3/uL — ABNORMAL LOW (ref 150–400)
RBC: 3.48 MIL/uL — ABNORMAL LOW (ref 4.22–5.81)
RDW: 15.4 % (ref 11.5–15.5)
WBC: 4.5 10*3/uL (ref 4.0–10.5)
nRBC: 0 % (ref 0.0–0.2)

## 2018-09-01 LAB — TROPONIN I: Troponin I: 0.07 ng/mL (ref <0.03)

## 2018-09-01 LAB — COMPREHENSIVE METABOLIC PANEL
ALT: 65 U/L — ABNORMAL HIGH (ref 0–44)
AST: 44 U/L — ABNORMAL HIGH (ref 15–41)
Albumin: 3.9 g/dL (ref 3.5–5.0)
Alkaline Phosphatase: 74 U/L (ref 38–126)
Anion gap: 6 (ref 5–15)
BUN: 77 mg/dL — ABNORMAL HIGH (ref 8–23)
CO2: 21 mmol/L — ABNORMAL LOW (ref 22–32)
Calcium: 10.2 mg/dL (ref 8.9–10.3)
Chloride: 119 mmol/L — ABNORMAL HIGH (ref 98–111)
Creatinine, Ser: 4.02 mg/dL — ABNORMAL HIGH (ref 0.61–1.24)
GFR calc Af Amer: 15 mL/min — ABNORMAL LOW (ref >60)
GFR calc non Af Amer: 13 mL/min — ABNORMAL LOW (ref >60)
Glucose, Bld: 140 mg/dL — ABNORMAL HIGH (ref 70–99)
Potassium: 4 mmol/L (ref 3.5–5.1)
Sodium: 146 mmol/L — ABNORMAL HIGH (ref 135–145)
Total Bilirubin: 1.4 mg/dL — ABNORMAL HIGH (ref 0.3–1.2)
Total Protein: 6.9 g/dL (ref 6.5–8.1)

## 2018-09-01 LAB — SARS CORONAVIRUS 2 BY RT PCR (HOSPITAL ORDER, PERFORMED IN ~~LOC~~ HOSPITAL LAB): SARS Coronavirus 2: NEGATIVE

## 2018-09-01 LAB — URINALYSIS, ROUTINE W REFLEX MICROSCOPIC
Bilirubin Urine: NEGATIVE
Glucose, UA: NEGATIVE mg/dL
Hgb urine dipstick: NEGATIVE
Ketones, ur: NEGATIVE mg/dL
Leukocytes,Ua: NEGATIVE
Nitrite: NEGATIVE
Protein, ur: NEGATIVE mg/dL
Specific Gravity, Urine: 1.009 (ref 1.005–1.030)
pH: 5 (ref 5.0–8.0)

## 2018-09-01 LAB — MAGNESIUM: Magnesium: 2.2 mg/dL (ref 1.7–2.4)

## 2018-09-01 MED ORDER — ONDANSETRON HCL 4 MG PO TABS: 4.0000 mg | ORAL_TABLET | Freq: Four times a day (QID) | ORAL | Status: DC | PRN

## 2018-09-01 MED ORDER — ADULT MULTIVITAMIN W/MINERALS CH
1.0000 | ORAL_TABLET | Freq: Every day | ORAL | Status: DC
  Administered 2018-09-02 – 2018-09-03 (×2): 1 via ORAL
  Filled 2018-09-01 (×2): qty 1

## 2018-09-01 MED ORDER — CALCITRIOL 0.25 MCG PO CAPS
0.2500 ug | ORAL_CAPSULE | Freq: Every day | ORAL | Status: DC
  Administered 2018-09-02 – 2018-09-03 (×2): 0.25 ug via ORAL
  Filled 2018-09-01 (×2): qty 1

## 2018-09-01 MED ORDER — INSULIN ASPART 100 UNIT/ML ~~LOC~~ SOLN
0.0000 [IU] | Freq: Three times a day (TID) | SUBCUTANEOUS | Status: DC
  Administered 2018-09-02: 17:00:00 1 [IU] via SUBCUTANEOUS

## 2018-09-01 MED ORDER — ACETAMINOPHEN 650 MG RE SUPP: 650.0000 mg | Freq: Four times a day (QID) | RECTAL | Status: DC | PRN

## 2018-09-01 MED ORDER — ASPIRIN EC 81 MG PO TBEC
81.0000 mg | DELAYED_RELEASE_TABLET | Freq: Every day | ORAL | Status: DC
  Administered 2018-09-02 – 2018-09-03 (×2): 81 mg via ORAL
  Filled 2018-09-01 (×2): qty 1

## 2018-09-01 MED ORDER — ACETAMINOPHEN 325 MG PO TABS: 650.0000 mg | ORAL_TABLET | Freq: Four times a day (QID) | ORAL | Status: DC | PRN

## 2018-09-01 MED ORDER — LEVOTHYROXINE SODIUM 75 MCG PO TABS
150.0000 ug | ORAL_TABLET | Freq: Every day | ORAL | Status: DC
  Administered 2018-09-02 – 2018-09-03 (×2): 150 ug via ORAL
  Filled 2018-09-01 (×2): qty 2

## 2018-09-01 MED ORDER — SODIUM CHLORIDE 0.9 % IV SOLN
2.0000 g | INTRAVENOUS | Status: DC
  Administered 2018-09-02 (×2): 2 g via INTRAVENOUS
  Filled 2018-09-01 (×3): qty 2

## 2018-09-01 MED ORDER — VANCOMYCIN HCL IN DEXTROSE 750-5 MG/150ML-% IV SOLN
750.0000 mg | INTRAVENOUS | Status: DC
  Administered 2018-09-02: 750 mg via INTRAVENOUS
  Filled 2018-09-01 (×2): qty 150

## 2018-09-01 MED ORDER — HEPARIN SODIUM (PORCINE) 5000 UNIT/ML IJ SOLN
5000.0000 [IU] | Freq: Three times a day (TID) | INTRAMUSCULAR | Status: DC
  Administered 2018-09-02 – 2018-09-03 (×4): 5000 [IU] via SUBCUTANEOUS
  Filled 2018-09-01 (×4): qty 1

## 2018-09-01 MED ORDER — ATORVASTATIN CALCIUM 10 MG PO TABS
20.0000 mg | ORAL_TABLET | Freq: Every day | ORAL | Status: DC
  Administered 2018-09-02 – 2018-09-03 (×2): 20 mg via ORAL
  Filled 2018-09-01 (×2): qty 2

## 2018-09-01 MED ORDER — SODIUM CHLORIDE 0.9 % IV BOLUS
500.0000 mL | Freq: Once | INTRAVENOUS | Status: AC
  Administered 2018-09-01: 500 mL via INTRAVENOUS

## 2018-09-01 MED ORDER — ONDANSETRON HCL 4 MG/2ML IJ SOLN: 4.0000 mg | Freq: Four times a day (QID) | INTRAMUSCULAR | Status: DC | PRN

## 2018-09-01 MED ORDER — SODIUM CHLORIDE 0.9 % IV SOLN: INTRAVENOUS | Status: DC

## 2018-09-01 NOTE — ED Provider Notes (Addendum)
[facility] EMERGENCY DEPARTMENT Provider Note   CSN: [id number] Arrival date & time: 09/01/18  1648    History   Chief Complaint No chief complaint on file.   HPI BRETTON TANDY is a 85 y.o. male.     Patient noted with generalized weakness for past couple days, and EMS called today w ?low heart rate. Family notes periods of confusion, example given of trying to shave using toothpaste. Patients symptoms acute onset, persistent, waxing and waning, no specific or consistent exacerbating or alleviating factors. Patient indicates feels fine and denies any c/o. No headache. No chest pain or discomfort. No sob. Denies abd pain or nvd. No dysuria or gu c/o. Patient unaware of any recent change in medication. Normal appetite. No fever or chills. No cough or uri symptoms. No syncope. No report of trauma or fall. EMS indicates rhythm strip showed 2nd degree heartblock.   The history is provided by the patient, the EMS personnel and a relative.    Past Medical History:  Diagnosis Date  . Anemia    low iron  . BPH (benign prostatic hyperplasia)   . CAD (coronary artery disease)   . Carotid artery stenosis    1-39% bilateral carotid artery stenosis and < 50% stenosis in the right CCA by dopplers 06/2017  . CKD (chronic kidney disease), stage III (HCC)    Stage 4  . Diastolic dysfunction   . Glaucoma   . Graves disease   . Heart murmur, systolic   . History of ETOH abuse   . History of PFTs 05/2010   moderate airflow obstruction w reduced DLCO by PFT   . Hyperlipemia   . Hypertension   . Hyperthyroidism 08/26/10   radioactive iodine therapy   . Mitral regurgitation echo 2015   mild  . Multiple thyroid nodules   . MVP (mitral valve prolapse) 11/2012   posterior MVP  . Organic impotence   . OSA (obstructive sleep apnea)    upper airway resistance syndrome with RDI 18/hr - not on CPAP due to insurance not covering  . Pre-diabetes   . PUD (peptic ulcer disease)   .  Pulmonary hypertension ([place]) echo 2015   Group 2 with pulmonary venous HTN and Group 3 with OSA  . Shutdown renal    after cardiac cath  . Upper airway resistance syndrome     Patient Active Problem List   Diagnosis Date Noted  . GI bleeding 10/15/2016  . GI bleed 10/15/2016  . Symptomatic anemia   . CKD (chronic kidney disease), stage V ([place])   . Hypertensive urgency   . Vascular dementia with behavioral disturbance ([place]) 09/24/2016  . Hypertensive encephalopathy   . Branch retinal vein occlusion of right eye 05/07/2016  . Graves' orbitopathy 05/07/2016  . Primary open angle glaucoma of left eye, mild stage 05/07/2016  . Primary open angle glaucoma of right eye, severe stage 05/07/2016  . PAD (peripheral artery disease) ([place]) 04/20/2016  . Bilateral renal artery stenosis ([place]) 02/18/2015  . Pulmonary hypertension ([place]) 02/10/2015  . Chronic diastolic CHF (congestive heart failure) ([place]) 02/10/2015  . Renal bruit 01/07/2015  . Carotid artery stenosis   . Diastolic dysfunction   . Bradycardia 05/29/2013  . Glaucoma 04/09/2011  . COPD (chronic obstructive pulmonary disease) ([place]) 09/21/2010  . OSA (obstructive sleep apnea)   . Upper airway resistance syndrome   . Hypothyroidism   . Type 2 diabetes, diet controlled ([place])   . CAD (coronary artery disease)   .  Hyperlipemia   . Hypertensive heart and chronic kidney disease with heart failure and stage 1 through stage 4 chronic kidney disease, or chronic kidney disease ([place])   . PUD (peptic ulcer disease)     Past Surgical History:  Procedure Laterality Date  . APPENDECTOMY    . AV FISTULA PLACEMENT Left 10/18/2017   Procedure: ARTERIOVENOUS (AV) FISTULA CREATION ARM;  Surgeon: Waynetta Sandy, MD;  Location: [facility];  Service: Vascular;  Laterality: Left;  . CARDIAC CATHETERIZATION    . CARDIAC CATHETERIZATION N/A 02/17/2015   Procedure: Right Heart Cath;  Surgeon: Larey Dresser, MD;  Location: [facility];   Service: Cardiovascular;  Laterality: N/A;  . ESOPHAGOGASTRODUODENOSCOPY (EGD) WITH PROPOFOL Left 10/16/2016   Procedure: ESOPHAGOGASTRODUODENOSCOPY (EGD) WITH PROPOFOL;  Surgeon: Ronnette Juniper, MD;  Location: [facility];  Service: Gastroenterology;  Laterality: Left;  . heart catherization    . HERNIA REPAIR  10/2009  . IR RADIOLOGIST EVAL & MGMT  09/28/2016  . IR RADIOLOGIST EVAL & MGMT  10/19/2016  . IR RADIOLOGIST EVAL & MGMT  01/18/2017  . RIGHT HEART CATH N/A 07/28/2016   Procedure: Right Heart Cath;  Surgeon: Larey Dresser, MD;  Location: [facility];  Service: Cardiovascular;  Laterality: N/A;        Home Medications    Prior to Admission medications   Medication Sig Start Date End Date Taking? Authorizing Provider  amLODipine (NORVASC) 10 MG tablet Take 10 mg by mouth daily.    [provider]  aspirin EC 81 MG tablet Take 81 mg by mouth daily.    [provider]  atorvastatin (LIPITOR) 20 MG tablet TAKE 1 TABLET BY MOUTH EVERY DAY 05/30/18   Turner, Eber Hong, MD  Brinzolamide-Brimonidine [facility]) 1-0.2 % SUSP Place 1 drop into both eyes 2 (two) times daily.    [provider]  calcitRIOL (ROCALTROL) 0.25 MCG capsule Take 0.25 mcg by mouth daily. 02/24/18   [provider]  doxazosin (CARDURA) 4 MG tablet Take 4 mg by mouth daily.    [provider]  Epoetin Alfa (PROCRIT IJ) Inject 5,000 Units as directed every 7 (seven) days.    [provider]  feeding supplement, ENSURE ENLIVE, (ENSURE ENLIVE) LIQD Take 237 mLs by mouth 2 (two) times daily between meals. 09/24/16   Dhungel, Flonnie Overman, MD  furosemide (LASIX) 40 MG tablet Take 40 mg by mouth 2 (two) times daily.    [provider]  hydrALAZINE (APRESOLINE) 100 MG tablet Take 1 tablet (100 mg total) by mouth 3 (three) times daily. Please make yearly appt with Dr. Radford Pax for October. 1st attempt 06/15/17   Sueanne Margarita, MD  levothyroxine (SYNTHROID, LEVOTHROID) 150  MCG tablet Take 150 mcg by mouth daily before breakfast.    [provider]  losartan (COZAAR) 50 MG tablet Take 50 mg by mouth daily. 04/26/18   [provider]  Multiple Vitamin (MULTIVITAMIN WITH MINERALS) TABS tablet Take 1 tablet by mouth daily.    [provider]    Family History Family History  Problem Relation Age of Onset  . Kidney failure Father   . Hypertension Father   . Colon cancer Mother   . Colon cancer Brother   . Lung disease Brother   . Colon cancer Maternal Uncle     Social History Social History   Tobacco Use  . Smoking status: Former Smoker    Packs/day: 2.00    Years: 40.00  Pack years: 80.00    Types: Cigarettes    Last attempt to quit: 03/29/1984    Years since quitting: 34.4  . Smokeless tobacco: Never Used  Substance Use Topics  . Alcohol use: No    Comment: quit in 1981  . Drug use: No     Allergies   Iodinated diagnostic agents; Zocor [simvastatin]; Budesonide-formoterol fumarate; Minoxidil; Tiotropium bromide monohydrate; and Tricor [fenofibrate]   Review of Systems Review of Systems  Constitutional: Negative for chills and fever.  HENT: Negative for sore throat.   Eyes: Negative for redness.  Respiratory: Negative for cough and shortness of breath.   Cardiovascular: Negative for chest pain.  Gastrointestinal: Negative for abdominal pain, diarrhea and vomiting.  Genitourinary: Negative for dysuria and flank pain.  Musculoskeletal: Negative for back pain and neck pain.  Skin: Negative for rash.  Neurological: Negative for speech difficulty, numbness and headaches.  Hematological: Does not bruise/bleed easily.  Psychiatric/Behavioral: Positive for confusion.     Physical Exam Updated Vital Signs There were no vitals taken for this visit.  Physical Exam Vitals signs and nursing note reviewed.  Constitutional:      Appearance: Normal appearance. He is well-developed.  HENT:     Head: Atraumatic.      Nose: Nose normal.     Mouth/Throat:     Mouth: Mucous membranes are moist.     Pharynx: Oropharynx is clear.  Eyes:     General: No scleral icterus.    Conjunctiva/sclera: Conjunctivae normal.     Pupils: Pupils are equal, round, and reactive to light.  Neck:     Musculoskeletal: Normal range of motion and neck supple. No neck rigidity or muscular tenderness.     Vascular: No carotid bruit.     Trachea: No tracheal deviation.  Cardiovascular:     Rate and Rhythm: Regular rhythm. Bradycardia present.     Pulses: Normal pulses.     Heart sounds: Normal heart sounds. No murmur. No friction rub. No gallop.   Pulmonary:     Effort: Pulmonary effort is normal. No accessory muscle usage or respiratory distress.     Breath sounds: Normal breath sounds.  Abdominal:     General: Bowel sounds are normal. There is no distension.     Palpations: Abdomen is soft. There is no mass.     Tenderness: There is no abdominal tenderness. There is no guarding.     Hernia: No hernia is present.  Genitourinary:    Comments: No cva tenderness. Musculoskeletal:        General: No swelling.     Right lower leg: No edema.     Left lower leg: No edema.  Lymphadenopathy:     Cervical: No cervical adenopathy.  Skin:    General: Skin is warm and dry.     Findings: No rash.  Neurological:     Mental Status: He is alert.     Comments: Alert, speech clear. Oriented. Motor intact bil, stre 5/5. sens grossly intact bil. EMS notes ambulated with steady gait approximately 10 steps to stretcher.   Psychiatric:        Mood and Affect: Mood normal.      ED Treatments / Results  Labs (all labs ordered are listed, but only abnormal results are displayed) Results for orders placed or performed during the hospital encounter of 09/01/18  Comprehensive metabolic panel  Result Value Ref Range   Sodium 146 (H) 135 - 145 mmol/L   Potassium 4.0 3.5 -  5.1 mmol/L   Chloride 119 (H) 98 - 111 mmol/L   CO2 21 (L) 22 -  32 mmol/L   Glucose, Bld 140 (H) 70 - 99 mg/dL   BUN 77 (H) 8 - 23 mg/dL   Creatinine, Ser 4.02 (H) 0.61 - 1.24 mg/dL   Calcium 10.2 8.9 - 10.3 mg/dL   Total Protein 6.9 6.5 - 8.1 g/dL   Albumin 3.9 3.5 - 5.0 g/dL   AST 44 (H) 15 - 41 U/L   ALT 65 (H) 0 - 44 U/L   Alkaline Phosphatase 74 38 - 126 U/L   Total Bilirubin 1.4 (H) 0.3 - 1.2 mg/dL   GFR calc non Af Amer 13 (L) >60 mL/min   GFR calc Af Amer 15 (L) >60 mL/min   Anion gap 6 5 - 15  CBC  Result Value Ref Range   WBC 4.5 4.0 - 10.5 K/uL   RBC 3.48 (L) 4.22 - 5.81 MIL/uL   Hemoglobin 11.0 (L) 13.0 - 17.0 g/dL   HCT 34.9 (L) 39.0 - 52.0 %   MCV 100.3 (H) 80.0 - 100.0 fL   MCH 31.6 26.0 - 34.0 pg   MCHC 31.5 30.0 - 36.0 g/dL   RDW 15.4 11.5 - 15.5 %   Platelets 132 (L) 150 - 400 K/uL   nRBC 0.0 0.0 - 0.2 %  Urinalysis, Routine w reflex microscopic  Result Value Ref Range   Color, Urine YELLOW YELLOW   APPearance CLEAR CLEAR   Specific Gravity, Urine 1.009 1.005 - 1.030   pH 5.0 5.0 - 8.0   Glucose, UA NEGATIVE NEGATIVE mg/dL   Hgb urine dipstick NEGATIVE NEGATIVE   Bilirubin Urine NEGATIVE NEGATIVE   Ketones, ur NEGATIVE NEGATIVE mg/dL   Protein, ur NEGATIVE NEGATIVE mg/dL   Nitrite NEGATIVE NEGATIVE   Leukocytes,Ua NEGATIVE NEGATIVE  Troponin I - ONCE - STAT  Result Value Ref Range   Troponin I 0.07 (HH) <0.03 ng/mL  Magnesium  Result Value Ref Range   Magnesium 2.2 1.7 - 2.4 mg/dL  CBG monitoring, ED  Result Value Ref Range   Glucose-Capillary 141 (H) 70 - 99 mg/dL   Comment 1 Notify RN    Comment 2 Document in Chart    Ct Head Wo Contrast  Result Date: 09/01/2018 CLINICAL DATA:  Altered level of consciousness/confusion EXAM: CT HEAD WITHOUT CONTRAST TECHNIQUE: Contiguous axial images were obtained from the base of the skull through the vertex without intravenous contrast. COMPARISON:  May 20, 2018 FINDINGS: Brain: Moderate diffuse atrophy is stable. There is no intracranial mass, hemorrhage, extra-axial  fluid collection, or midline shift. There is patchy small vessel disease throughout the centra semiovale bilaterally. No acute infarct is demonstrable. Vascular: No hyperdense vessel. There is calcification in each distal vertebral artery and in the carotid siphon regions bilaterally. Skull: The bony calvarium appears intact. Sinuses/Orbits: There is mucosal thickening in each maxillary antrum. There is mucosal thickening in several ethmoid air cells. Orbits appear symmetric bilaterally. Other: Apparent small skin nevus over the right frontal-parietal junction region. Mastoid air cells are clear. IMPRESSION: Stable atrophy with periventricular small vessel disease. No acute infarct evident. No mass or hemorrhage. There are foci of arterial vascular calcification. There are foci of paranasal sinus disease at several sites. Electronically Signed   By: Lowella Grip III M.D.   On: 09/01/2018 19:46   Dg Chest Port 1 View  Result Date: 09/01/2018 CLINICAL DATA:  Weakness. EXAM: PORTABLE CHEST 1 VIEW COMPARISON:  Chest x-ray dated  May 20, 2018. FINDINGS: Stable cardiomegaly. Normal mediastinal contours. Persistent pulmonary vascular congestion. No focal consolidation, pleural effusion, or pneumothorax. No acute osseous abnormality. IMPRESSION: Stable cardiomegaly and pulmonary vascular congestion without overt edema. Electronically Signed   By: Titus Dubin M.D.   On: 09/01/2018 17:56    EKG EKG Interpretation  Date/Time:  Friday September 01 2018 17:06:21 EDT Ventricular Rate:  166 PR Interval:    QRS Duration: 116 QT Interval:  292 QTC Calculation: 490 R Axis:   -28 Text Interpretation:  Undetermined rhythm `narrow complex bradycardia.  Artifact in lead(s) II aVR aVF V1 V2 V3 V4 V5 V6 Confirmed by Lajean Saver [phone]) on 09/01/2018 5:26:02 PM   Radiology Ct Head Wo Contrast  Result Date: 09/01/2018 CLINICAL DATA:  Altered level of consciousness/confusion EXAM: CT HEAD WITHOUT CONTRAST  TECHNIQUE: Contiguous axial images were obtained from the base of the skull through the vertex without intravenous contrast. COMPARISON:  May 20, 2018 FINDINGS: Brain: Moderate diffuse atrophy is stable. There is no intracranial mass, hemorrhage, extra-axial fluid collection, or midline shift. There is patchy small vessel disease throughout the centra semiovale bilaterally. No acute infarct is demonstrable. Vascular: No hyperdense vessel. There is calcification in each distal vertebral artery and in the carotid siphon regions bilaterally. Skull: The bony calvarium appears intact. Sinuses/Orbits: There is mucosal thickening in each maxillary antrum. There is mucosal thickening in several ethmoid air cells. Orbits appear symmetric bilaterally. Other: Apparent small skin nevus over the right frontal-parietal junction region. Mastoid air cells are clear. IMPRESSION: Stable atrophy with periventricular small vessel disease. No acute infarct evident. No mass or hemorrhage. There are foci of arterial vascular calcification. There are foci of paranasal sinus disease at several sites. Electronically Signed   By: Lowella Grip III M.D.   On: 09/01/2018 19:46   Dg Chest Port 1 View  Result Date: 09/01/2018 CLINICAL DATA:  Weakness. EXAM: PORTABLE CHEST 1 VIEW COMPARISON:  Chest x-ray dated May 20, 2018. FINDINGS: Stable cardiomegaly. Normal mediastinal contours. Persistent pulmonary vascular congestion. No focal consolidation, pleural effusion, or pneumothorax. No acute osseous abnormality. IMPRESSION: Stable cardiomegaly and pulmonary vascular congestion without overt edema. Electronically Signed   By: Titus Dubin M.D.   On: 09/01/2018 17:56    Procedures Procedures (including critical care time)  Medications Ordered in ED Medications  0.9 %  sodium chloride infusion (has no administration in time range)     Initial Impression / Assessment and Plan / ED Course  I have reviewed the triage  vital signs and the nursing notes.  Pertinent labs & imaging results that were available during my care of the patient were reviewed by me and considered in my medical decision making (see chart for details).  Iv ns. Labs sent. Continuous pulse ox and monitor. Ecg. Pcxr.   Reviewed nursing notes and prior charts for additional history.   CT reviewed by me - no acute hem.  Iv ns bolus.   CXR reviewed by me - no pna.   Labs reviewed by me - severe chronic renal insuff. New hypernatremia.   Given weakness, bradycardia, faintness, soft bp, hypernatremia, elev trop - will admit.   Hospitalists consulted for admission.    Final Clinical Impressions(s) / ED Diagnoses   Final diagnoses:  None    ED Discharge Orders    None         Lajean Saver, MD 09/01/18 2054

## 2018-09-01 NOTE — ED Triage Notes (Addendum)
Pt arrive via ems from home with co bradycardia and altered mental status like using toothpaste as shaving cream. Stroke exam neg per ems. LSN Wed. Family also states increased urination the past few days.  170/70  bp 54 hr 17 rr 150 cbg 98.1 T 99%

## 2018-09-01 NOTE — ED Notes (Signed)
Daughter [name] updated.

## 2018-09-01 NOTE — H&P (Signed)
History and Physical    Jeffrey Campbell HYQ:[id number] DOB: [birth year] DOA: 09/01/2018  PCP: [name], MD  Patient coming from: Home.  Chief Complaint: Increasing confusion.  History obtained from patient's wife.  HPI: Jeffrey Campbell is a 85 y.o. male with history of pulmonary hypertension, sleep apnea, CAD, chronic kidney disease stage IV, chronic anemia previous history of alcohol abuse was brought to the ER after patient was found to be increasingly confused over the last 3 days.  As per patient's wife who provided the history patient has been having increasing periods of confusion where patient was using the toothpaste for as shaving cream using the magnifying glass for as needed order and he was not himself last 3 days.  He not have any nausea vomiting or diarrhea did not complain of any chest pain shortness of breath or any productive cough or fever.  Has not had any changes in his medication.  ED Course: In the ER patient was alert awake oriented to his name and place moves all extremities CT head was unremarkable.  Labs are largely at baseline except for sodium of 146.  CT head was unremarkable EKG showed sinus bradycardia which has been chronic.  Patient blood pressure is in the low normal along with mild hypothermia.  Patient admitted for acute encephalopathy and further work-up.  Chest x-ray was showing stable cardiomegaly and COVID-19 was negative.  UA unremarkable.  Review of Systems: As per HPI, rest all negative.   Past Medical History:  Diagnosis Date   Anemia    low iron   BPH (benign prostatic hyperplasia)    CAD (coronary artery disease)    Carotid artery stenosis    1-39% bilateral carotid artery stenosis and < 50% stenosis in the right CCA by dopplers 06/2017   CKD (chronic kidney disease), stage III (HCC)    Stage 4   Diastolic dysfunction    Glaucoma    Graves disease    Heart murmur, systolic    History of ETOH abuse    History of PFTs 05/2010     moderate airflow obstruction w reduced DLCO by PFT    Hyperlipemia    Hypertension    Hyperthyroidism 08/26/10   radioactive iodine therapy    Mitral regurgitation echo 2015   mild   Multiple thyroid nodules    MVP (mitral valve prolapse) 11/2012   posterior MVP   Organic impotence    OSA (obstructive sleep apnea)    upper airway resistance syndrome with RDI 18/hr - not on CPAP due to insurance not covering   Pre-diabetes    PUD (peptic ulcer disease)    Pulmonary hypertension ([place]) echo 2015   Group 2 with pulmonary venous HTN and Group 3 with OSA   Shutdown renal    after cardiac cath   Upper airway resistance syndrome     Past Surgical History:  Procedure Laterality Date   APPENDECTOMY     AV FISTULA PLACEMENT Left 10/18/2017   Procedure: ARTERIOVENOUS (AV) FISTULA CREATION ARM;  Surgeon: Waynetta Sandy, MD;  Location: [facility];  Service: Vascular;  Laterality: Left;   CARDIAC CATHETERIZATION     CARDIAC CATHETERIZATION N/A 02/17/2015   Procedure: Right Heart Cath;  Surgeon: Larey Dresser, MD;  Location: [facility];  Service: Cardiovascular;  Laterality: N/A;   ESOPHAGOGASTRODUODENOSCOPY (EGD) WITH PROPOFOL Left 10/16/2016   Procedure: ESOPHAGOGASTRODUODENOSCOPY (EGD) WITH PROPOFOL;  Surgeon: Ronnette Juniper, MD;  Location: [facility];  Service: Gastroenterology;  Laterality: Left;   heart catherization     HERNIA REPAIR  10/2009   IR RADIOLOGIST EVAL & MGMT  09/28/2016   IR RADIOLOGIST EVAL & MGMT  10/19/2016   IR RADIOLOGIST EVAL & MGMT  01/18/2017   RIGHT HEART CATH N/A 07/28/2016   Procedure: Right Heart Cath;  Surgeon: Larey Dresser, MD;  Location: [facility];  Service: Cardiovascular;  Laterality: N/A;     reports that he quit smoking about 34 years ago. His smoking use included cigarettes. He has a 80.00 pack-year smoking history. He has never used smokeless tobacco. He reports that he does not drink alcohol or use  drugs.  Allergies  Allergen Reactions   Iodinated Diagnostic Agents Other (See Comments)    Right heart cath 07/2016 allegedly "shut down his kidneys" Patient has stage III chronic kidney disease   Zocor [Simvastatin] Other (See Comments)    Liver problems    Budesonide-Formoterol Fumarate Other (See Comments)    Dry mouth   Minoxidil Other (See Comments)   Tiotropium Bromide Monohydrate Other (See Comments)    Dry mouth   Tricor [Fenofibrate]          Family History  Problem Relation Age of Onset   Kidney failure Father    Hypertension Father    Colon cancer Mother    Colon cancer Brother    Lung disease Brother    Colon cancer Maternal Uncle     Prior to Admission medications   Medication Sig Start Date End Date Taking? Authorizing Provider  amLODipine (NORVASC) 10 MG tablet Take 10 mg by mouth daily.   Yes [provider]  aspirin EC 81 MG tablet Take 81 mg by mouth daily.   Yes [provider]  atorvastatin (LIPITOR) 20 MG tablet TAKE 1 TABLET BY MOUTH EVERY DAY Patient taking differently: Take 20 mg by mouth daily.  05/30/18  Yes Turner, Eber Hong, MD  Brinzolamide-Brimonidine [facility]) 1-0.2 % SUSP Place 1 drop into both eyes 2 (two) times daily.   Yes [provider]  calcitRIOL (ROCALTROL) 0.25 MCG capsule Take 0.25 mcg by mouth daily. 02/24/18  Yes [provider]  doxazosin (CARDURA) 4 MG tablet Take 4 mg by mouth daily.   Yes [provider]  furosemide (LASIX) 40 MG tablet Take 80 mg by mouth daily.    Yes [provider]  hydrALAZINE (APRESOLINE) 100 MG tablet Take 1 tablet (100 mg total) by mouth 3 (three) times daily. Please make yearly appt with Dr. Radford Pax for October. 1st attempt Patient taking differently: Take 100 mg by mouth 3 (three) times daily.  06/15/17  Yes Turner, Eber Hong, MD  levothyroxine (SYNTHROID, LEVOTHROID) 150 MCG tablet Take 150 mcg by mouth daily before breakfast.   Yes [provider]  losartan (COZAAR) 50 MG tablet Take 50 mg by mouth daily. 04/26/18  Yes [provider]  Multiple Vitamin (MULTIVITAMIN WITH MINERALS) TABS tablet Take 1 tablet by mouth daily.   Yes [provider]  Epoetin Alfa (PROCRIT IJ) Inject 5,000 Units as directed every 7 (seven) days.    [provider]    Physical Exam: Vitals:   09/01/18 2054 09/01/18 2100 09/01/18 2115 09/01/18 2245  BP: 91/65 94/72 98/73  99/74  Pulse: (!) 45 (!) 43 (!) 44 93  Resp: 13 16 16 17   Temp:      TempSrc:      SpO2: 96% 96% 96% 96%  Weight:  Height:          Constitutional: Moderately built and nourished. Vitals:   09/01/18 2054 09/01/18 2100 09/01/18 2115 09/01/18 2245  BP: 91/65 94/72 98/73  99/74  Pulse: (!) 45 (!) 43 (!) 44 93  Resp: 13 16 16 17   Temp:      TempSrc:      SpO2: 96% 96% 96% 96%  Weight:      Height:       Eyes: Anicteric no pallor. ENMT: No discharge from the ears eyes nose or mouth. Neck: No mass felt.  No neck rigidity. Respiratory: No rhonchi or crepitations. Cardiovascular: S1-S2 heard. Abdomen: Soft nontender bowel sounds present. Musculoskeletal: No edema. Skin: No rash. Neurologic: Alert awake oriented to his name and place moves all extremities. Psychiatric: Oriented to his name and place.   Labs on Admission: I have personally reviewed following labs and imaging studies  CBC: Recent Labs  Lab 09/01/18 1800  WBC 4.5  HGB 11.0*  HCT 34.9*  MCV 100.3*  PLT 675*   Basic Metabolic Panel: Recent Labs  Lab 09/01/18 1800  NA 146*  K 4.0  CL 119*  CO2 21*  GLUCOSE 140*  BUN 77*  CREATININE 4.02*  CALCIUM 10.2  MG 2.2   GFR: Estimated Creatinine Clearance: 12.1 mL/min (A) (by C-G formula based on SCr of 4.02 mg/dL (H)). Liver Function Tests: Recent Labs  Lab 09/01/18 1800  AST 44*  ALT 65*  ALKPHOS 74  BILITOT 1.4*  PROT 6.9  ALBUMIN 3.9   No results for input(s): LIPASE, AMYLASE in the last 168  hours. No results for input(s): AMMONIA in the last 168 hours. Coagulation Profile: No results for input(s): INR, PROTIME in the last 168 hours. Cardiac Enzymes: Recent Labs  Lab 09/01/18 1800  TROPONINI 0.07*   BNP (last 3 results) Recent Labs    09/06/17 0850  PROBNP 2,000*   HbA1C: No results for input(s): HGBA1C in the last 72 hours. CBG: Recent Labs  Lab 09/01/18 1705  GLUCAP 141*   Lipid Profile: No results for input(s): CHOL, HDL, LDLCALC, TRIG, CHOLHDL, LDLDIRECT in the last 72 hours. Thyroid Function Tests: No results for input(s): TSH, T4TOTAL, FREET4, T3FREE, THYROIDAB in the last 72 hours. Anemia Panel: No results for input(s): VITAMINB12, FOLATE, FERRITIN, TIBC, IRON, RETICCTPCT in the last 72 hours. Urine analysis:    Component Value Date/Time   COLORURINE YELLOW 09/01/2018 1800   APPEARANCEUR CLEAR 09/01/2018 1800   LABSPEC 1.009 09/01/2018 1800   PHURINE 5.0 09/01/2018 1800   GLUCOSEU NEGATIVE 09/01/2018 1800   HGBUR NEGATIVE 09/01/2018 1800   BILIRUBINUR NEGATIVE 09/01/2018 1800   KETONESUR NEGATIVE 09/01/2018 1800   PROTEINUR NEGATIVE 09/01/2018 1800   NITRITE NEGATIVE 09/01/2018 1800   LEUKOCYTESUR NEGATIVE 09/01/2018 1800   Sepsis Labs: @LABRCNTIP (procalcitonin:4,lacticidven:4) ) Recent Results (from the past 240 hour(s))  SARS Coronavirus 2 (CEPHEID - Performed in [place] hospital lab), Hosp Order     Status: None   Collection Time: 09/01/18  9:07 PM  Result Value Ref Range Status   SARS Coronavirus 2 NEGATIVE NEGATIVE Final    Comment: (NOTE) If result is NEGATIVE SARS-CoV-2 target nucleic acids are NOT DETECTED. The SARS-CoV-2 RNA is generally detectable in upper and lower  respiratory specimens during the acute phase of infection. The lowest  concentration of SARS-CoV-2 viral copies this assay can detect is 250  copies / mL. A negative result does not preclude SARS-CoV-2 infection  and should not be used as the sole  basis for  treatment or other  patient management decisions.  A negative result may occur with  improper specimen collection / handling, submission of specimen other  than nasopharyngeal swab, presence of viral mutation(s) within the  areas targeted by this assay, and inadequate number of viral copies  (<250 copies / mL). A negative result must be combined with clinical  observations, patient history, and epidemiological information. If result is POSITIVE SARS-CoV-2 target nucleic acids are DETECTED. The SARS-CoV-2 RNA is generally detectable in upper and lower  respiratory specimens dur ing the acute phase of infection.  Positive  results are indicative of active infection with SARS-CoV-2.  Clinical  correlation with patient history and other diagnostic information is  necessary to determine patient infection status.  Positive results do  not rule out bacterial infection or co-infection with other viruses. If result is PRESUMPTIVE POSTIVE SARS-CoV-2 nucleic acids MAY BE PRESENT.   A presumptive positive result was obtained on the submitted specimen  and confirmed on repeat testing.  While 2019 novel coronavirus  (SARS-CoV-2) nucleic acids may be present in the submitted sample  additional confirmatory testing may be necessary for epidemiological  and / or clinical management purposes  to differentiate between  SARS-CoV-2 and other Sarbecovirus currently known to infect humans.  If clinically indicated additional testing with an alternate test  methodology [phone]) is advised. The SARS-CoV-2 RNA is generally  detectable in upper and lower respiratory sp ecimens during the acute  phase of infection. The expected result is Negative. Fact Sheet for Patients:  StrictlyIdeas.no Fact Sheet for Healthcare Providers: BankingDealers.co.za This test is not yet approved or cleared by the [place] FDA and has been authorized for detection and/or  diagnosis of SARS-CoV-2 by FDA under an Emergency Use Authorization (EUA).  This EUA will remain in effect (meaning this test can be used) for the duration of the COVID-19 declaration under Section 564(b)(1) of the Act, 21 U.S.C. section 360bbb-3(b)(1), unless the authorization is terminated or revoked sooner. Performed at [facility], [place] [address] [place], [place] [address]      Radiological Exams on Admission: Ct Head Wo Contrast  Result Date: 09/01/2018 CLINICAL DATA:  Altered level of consciousness/confusion EXAM: CT HEAD WITHOUT CONTRAST TECHNIQUE: Contiguous axial images were obtained from the base of the skull through the vertex without intravenous contrast. COMPARISON:  May 20, 2018 FINDINGS: Brain: Moderate diffuse atrophy is stable. There is no intracranial mass, hemorrhage, extra-axial fluid collection, or midline shift. There is patchy small vessel disease throughout the centra semiovale bilaterally. No acute infarct is demonstrable. Vascular: No hyperdense vessel. There is calcification in each distal vertebral artery and in the carotid siphon regions bilaterally. Skull: The bony calvarium appears intact. Sinuses/Orbits: There is mucosal thickening in each maxillary antrum. There is mucosal thickening in several ethmoid air cells. Orbits appear symmetric bilaterally. Other: Apparent small skin nevus over the right frontal-parietal junction region. Mastoid air cells are clear. IMPRESSION: Stable atrophy with periventricular small vessel disease. No acute infarct evident. No mass or hemorrhage. There are foci of arterial vascular calcification. There are foci of paranasal sinus disease at several sites. Electronically Signed   By: Lowella Grip III M.D.   On: 09/01/2018 19:46   Dg Chest Port 1 View  Result Date: 09/01/2018 CLINICAL DATA:  Weakness. EXAM: PORTABLE CHEST 1 VIEW COMPARISON:  Chest x-ray dated May 20, 2018. FINDINGS: Stable cardiomegaly. Normal  mediastinal contours. Persistent pulmonary vascular congestion. No focal consolidation, pleural effusion, or pneumothorax. No  acute osseous abnormality. IMPRESSION: Stable cardiomegaly and pulmonary vascular congestion without overt edema. Electronically Signed   By: Titus Dubin M.D.   On: 09/01/2018 17:56    EKG: Independently reviewed.  Sinus bradycardia with heart rate around 44 bpm.  Assessment/Plan Principal Problem:   Acute encephalopathy Active Problems:   OSA (obstructive sleep apnea)   Hypothyroidism   Type 2 diabetes, diet controlled (HCC)   COPD (chronic obstructive pulmonary disease) (HCC)   Bradycardia   Pulmonary hypertension (HCC)   Chronic diastolic CHF (congestive heart failure) (HCC)   Vascular dementia with behavioral disturbance (HCC)   CKD (chronic kidney disease), stage V ([place])   Hypothermia    1. Acute encephalopathy in the setting of dementia-cause not very clear.  Will check ammonia level MRI brain get blood cultures and patient blood pressure is in the low normal with hypothermia.  Since patient also has macrocytic anemia we will check B12 folate. 2. Hypothermia and low normal blood pressure will check blood cultures keep patient empiric antibiotic check lactate procalcitonin TSH cortisol. 3. Macrocytic anemia -patient does have a history of anemia but seeing macrocytosis first time in the blood pressure.  We will check anemia panel. 4. Bradycardia appears to be chronic closely follow. 5. Hypertension presently in the low normal blood pressure.  For now we will keep patient on PRN IV hydralazine for blood pressure improves restart home medications. 6. Chronic kidney disease stage V creatinine appears to be at baseline. 7. Hypothyroidism check TSH. 8. History of pulmonary hypertension CHF presently appears euvolemic and dehydrated.  Did receive fluids in the ER.  Will hold off any further fluids and closely monitor clinically. 9. Sleep apnea does not use  CPAP. 10. Diabetes mellitus type 2 per the chart.  Closely monitor CBGs. 11. Mildly elevated LFTs.  Follow LFTs closely.  Abdomen appears benign.   DVT prophylaxis: Heparin. Code Status: Full code as confirmed with patient's wife. Family Communication: Patient's wife. Disposition Plan: Home. Consults called: None. Admission status: Observation.   Rise Patience MD [facility] Pager [phone].  If 7PM-7AM, please contact night-coverage [email or web address] Password [email or web address]  09/01/2018, 10:59 PM

## 2018-09-01 NOTE — Progress Notes (Signed)
Pharmacy Antibiotic Note  Jeffrey Campbell is a 85 y.o. male admitted on 09/01/2018 with altered mental status.  Pharmacy has been consulted for Vancomycin/Cefepime dosing. WBC WNL. Noted renal dysfunction with CrCL ~10-15. Hypothermic. Borderline hypotension.   Plan: Vancomycin 750 mg IV q48h >>Estimated AUC: 535 Cefepime 2g IV q24h Trend WBC, temp, renal function  F/U infectious work-up Drug levels as indicated   Height: 5\' 9"  (175.3 cm) Weight: 134 lb 14.7 oz (61.2 kg) IBW/kg (Calculated) : 70.7  Temp (24hrs), Avg:96.2 F (35.7 C), Min:96.2 F (35.7 C), Max:96.2 F (35.7 C)  Recent Labs  Lab 09/01/18 1800  WBC 4.5  CREATININE 4.02*    Estimated Creatinine Clearance: 12.1 mL/min (A) (by C-G formula based on SCr of 4.02 mg/dL (H)).    Allergies  Allergen Reactions  . Iodinated Diagnostic Agents Other (See Comments)    Right heart cath 07/2016 allegedly "shut down his kidneys" Patient has stage III chronic kidney disease  . Zocor [Simvastatin] Other (See Comments)    Liver problems   . Budesonide-Formoterol Fumarate Other (See Comments)    Dry mouth  . Minoxidil Other (See Comments)  . Tiotropium Bromide Monohydrate Other (See Comments)    Dry mouth  . Tricor [Fenofibrate]          Narda Bonds 09/01/2018 11:25 PM

## 2018-09-02 ENCOUNTER — Observation Stay (HOSPITAL_COMMUNITY): Payer: Medicare Other

## 2018-09-02 DIAGNOSIS — I2510 Atherosclerotic heart disease of native coronary artery without angina pectoris: Secondary | ICD-10-CM | POA: Diagnosis present

## 2018-09-02 DIAGNOSIS — E870 Hyperosmolality and hypernatremia: Secondary | ICD-10-CM | POA: Diagnosis not present

## 2018-09-02 DIAGNOSIS — E872 Acidosis: Secondary | ICD-10-CM | POA: Diagnosis present

## 2018-09-02 DIAGNOSIS — E785 Hyperlipidemia, unspecified: Secondary | ICD-10-CM | POA: Diagnosis present

## 2018-09-02 DIAGNOSIS — E1122 Type 2 diabetes mellitus with diabetic chronic kidney disease: Secondary | ICD-10-CM | POA: Diagnosis present

## 2018-09-02 DIAGNOSIS — Z515 Encounter for palliative care: Secondary | ICD-10-CM | POA: Diagnosis not present

## 2018-09-02 DIAGNOSIS — R001 Bradycardia, unspecified: Secondary | ICD-10-CM | POA: Diagnosis not present

## 2018-09-02 DIAGNOSIS — R680 Hypothermia, not associated with low environmental temperature: Secondary | ICD-10-CM | POA: Diagnosis present

## 2018-09-02 DIAGNOSIS — N185 Chronic kidney disease, stage 5: Secondary | ICD-10-CM

## 2018-09-02 DIAGNOSIS — E039 Hypothyroidism, unspecified: Secondary | ICD-10-CM | POA: Diagnosis present

## 2018-09-02 DIAGNOSIS — F0151 Vascular dementia with behavioral disturbance: Principal | ICD-10-CM

## 2018-09-02 DIAGNOSIS — I132 Hypertensive heart and chronic kidney disease with heart failure and with stage 5 chronic kidney disease, or end stage renal disease: Secondary | ICD-10-CM | POA: Diagnosis present

## 2018-09-02 DIAGNOSIS — G4733 Obstructive sleep apnea (adult) (pediatric): Secondary | ICD-10-CM | POA: Diagnosis present

## 2018-09-02 DIAGNOSIS — T68XXXA Hypothermia, initial encounter: Secondary | ICD-10-CM | POA: Diagnosis not present

## 2018-09-02 DIAGNOSIS — D539 Nutritional anemia, unspecified: Secondary | ICD-10-CM | POA: Diagnosis present

## 2018-09-02 DIAGNOSIS — E860 Dehydration: Secondary | ICD-10-CM | POA: Diagnosis present

## 2018-09-02 DIAGNOSIS — J449 Chronic obstructive pulmonary disease, unspecified: Secondary | ICD-10-CM | POA: Diagnosis present

## 2018-09-02 DIAGNOSIS — E119 Type 2 diabetes mellitus without complications: Secondary | ICD-10-CM

## 2018-09-02 DIAGNOSIS — R4020 Unspecified coma: Secondary | ICD-10-CM | POA: Diagnosis not present

## 2018-09-02 DIAGNOSIS — Z1159 Encounter for screening for other viral diseases: Secondary | ICD-10-CM | POA: Diagnosis not present

## 2018-09-02 DIAGNOSIS — Z7189 Other specified counseling: Secondary | ICD-10-CM | POA: Diagnosis not present

## 2018-09-02 DIAGNOSIS — I959 Hypotension, unspecified: Secondary | ICD-10-CM | POA: Diagnosis present

## 2018-09-02 DIAGNOSIS — I272 Pulmonary hypertension, unspecified: Secondary | ICD-10-CM

## 2018-09-02 DIAGNOSIS — I5032 Chronic diastolic (congestive) heart failure: Secondary | ICD-10-CM | POA: Diagnosis present

## 2018-09-02 DIAGNOSIS — G9340 Encephalopathy, unspecified: Secondary | ICD-10-CM | POA: Diagnosis not present

## 2018-09-02 DIAGNOSIS — H409 Unspecified glaucoma: Secondary | ICD-10-CM | POA: Diagnosis present

## 2018-09-02 DIAGNOSIS — G9349 Other encephalopathy: Secondary | ICD-10-CM | POA: Diagnosis present

## 2018-09-02 DIAGNOSIS — N400 Enlarged prostate without lower urinary tract symptoms: Secondary | ICD-10-CM | POA: Diagnosis present

## 2018-09-02 DIAGNOSIS — H401113 Primary open-angle glaucoma, right eye, severe stage: Secondary | ICD-10-CM | POA: Diagnosis present

## 2018-09-02 DIAGNOSIS — H401121 Primary open-angle glaucoma, left eye, mild stage: Secondary | ICD-10-CM | POA: Diagnosis present

## 2018-09-02 DIAGNOSIS — D696 Thrombocytopenia, unspecified: Secondary | ICD-10-CM | POA: Diagnosis present

## 2018-09-02 LAB — TROPONIN I
Troponin I: 0.06 ng/mL (ref <0.03)
Troponin I: 0.06 ng/mL (ref <0.03)
Troponin I: 0.06 ng/mL (ref <0.03)

## 2018-09-02 LAB — CBC WITH DIFFERENTIAL/PLATELET
Abs Immature Granulocytes: 0.02 10*3/uL (ref 0.00–0.07)
Basophils Absolute: 0 10*3/uL (ref 0.0–0.1)
Basophils Relative: 0 %
Eosinophils Absolute: 0.2 10*3/uL (ref 0.0–0.5)
Eosinophils Relative: 3 %
HCT: 32.6 % — ABNORMAL LOW (ref 39.0–52.0)
Hemoglobin: 10.4 g/dL — ABNORMAL LOW (ref 13.0–17.0)
Immature Granulocytes: 0 %
Lymphocytes Relative: 16 %
Lymphs Abs: 0.8 10*3/uL (ref 0.7–4.0)
MCH: 31.6 pg (ref 26.0–34.0)
MCHC: 31.9 g/dL (ref 30.0–36.0)
MCV: 99.1 fL (ref 80.0–100.0)
Monocytes Absolute: 0.5 10*3/uL (ref 0.1–1.0)
Monocytes Relative: 9 %
Neutro Abs: 3.8 10*3/uL (ref 1.7–7.7)
Neutrophils Relative %: 72 %
Platelets: 131 10*3/uL — ABNORMAL LOW (ref 150–400)
RBC: 3.29 MIL/uL — ABNORMAL LOW (ref 4.22–5.81)
RDW: 15.1 % (ref 11.5–15.5)
WBC: 5.3 10*3/uL (ref 4.0–10.5)
nRBC: 0 % (ref 0.0–0.2)

## 2018-09-02 LAB — HEPATIC FUNCTION PANEL
ALT: 54 U/L — ABNORMAL HIGH (ref 0–44)
AST: 34 U/L (ref 15–41)
Albumin: 3.7 g/dL (ref 3.5–5.0)
Alkaline Phosphatase: 65 U/L (ref 38–126)
Bilirubin, Direct: 0.2 mg/dL (ref 0.0–0.2)
Indirect Bilirubin: 0.9 mg/dL (ref 0.3–0.9)
Total Bilirubin: 1.1 mg/dL (ref 0.3–1.2)
Total Protein: 6.3 g/dL — ABNORMAL LOW (ref 6.5–8.1)

## 2018-09-02 LAB — CBC
HCT: 34.1 % — ABNORMAL LOW (ref 39.0–52.0)
Hemoglobin: 10.9 g/dL — ABNORMAL LOW (ref 13.0–17.0)
MCH: 31.8 pg (ref 26.0–34.0)
MCHC: 32 g/dL (ref 30.0–36.0)
MCV: 99.4 fL (ref 80.0–100.0)
Platelets: 132 10*3/uL — ABNORMAL LOW (ref 150–400)
RBC: 3.43 MIL/uL — ABNORMAL LOW (ref 4.22–5.81)
RDW: 15.4 % (ref 11.5–15.5)
WBC: 4.7 10*3/uL (ref 4.0–10.5)
nRBC: 0 % (ref 0.0–0.2)

## 2018-09-02 LAB — BASIC METABOLIC PANEL
Anion gap: 10 (ref 5–15)
BUN: 73 mg/dL — ABNORMAL HIGH (ref 8–23)
CO2: 18 mmol/L — ABNORMAL LOW (ref 22–32)
Calcium: 9.9 mg/dL (ref 8.9–10.3)
Chloride: 118 mmol/L — ABNORMAL HIGH (ref 98–111)
Creatinine, Ser: 3.84 mg/dL — ABNORMAL HIGH (ref 0.61–1.24)
GFR calc Af Amer: 16 mL/min — ABNORMAL LOW (ref >60)
GFR calc non Af Amer: 14 mL/min — ABNORMAL LOW (ref >60)
Glucose, Bld: 111 mg/dL — ABNORMAL HIGH (ref 70–99)
Potassium: 3.6 mmol/L (ref 3.5–5.1)
Sodium: 146 mmol/L — ABNORMAL HIGH (ref 135–145)

## 2018-09-02 LAB — VITAMIN B12: Vitamin B-12: 1095 pg/mL — ABNORMAL HIGH (ref 180–914)

## 2018-09-02 LAB — CORTISOL: Cortisol, Plasma: 13.5 ug/dL

## 2018-09-02 LAB — IRON AND TIBC
Iron: 32 ug/dL — ABNORMAL LOW (ref 45–182)
Saturation Ratios: 16 % — ABNORMAL LOW (ref 17.9–39.5)
TIBC: 203 ug/dL — ABNORMAL LOW (ref 250–450)
UIBC: 171 ug/dL

## 2018-09-02 LAB — LACTIC ACID, PLASMA
Lactic Acid, Venous: 0.7 mmol/L (ref 0.5–1.9)
Lactic Acid, Venous: 0.6 mmol/L (ref 0.5–1.9)

## 2018-09-02 LAB — RETICULOCYTES
Immature Retic Fract: 12.4 % (ref 2.3–15.9)
RBC.: 3.4 MIL/uL — ABNORMAL LOW (ref 4.22–5.81)
Retic Count, Absolute: 69.4 10*3/uL (ref 19.0–186.0)
Retic Ct Pct: 2 % (ref 0.4–3.1)

## 2018-09-02 LAB — GLUCOSE, CAPILLARY
Glucose-Capillary: 97 mg/dL (ref 70–99)
Glucose-Capillary: 133 mg/dL — ABNORMAL HIGH (ref 70–99)
Glucose-Capillary: 135 mg/dL — ABNORMAL HIGH (ref 70–99)
Glucose-Capillary: 140 mg/dL — ABNORMAL HIGH (ref 70–99)

## 2018-09-02 LAB — CREATININE, SERUM
Creatinine, Ser: 4 mg/dL — ABNORMAL HIGH (ref 0.61–1.24)
GFR calc Af Amer: 15 mL/min — ABNORMAL LOW (ref >60)
GFR calc non Af Amer: 13 mL/min — ABNORMAL LOW (ref >60)

## 2018-09-02 LAB — TSH: TSH: 0.183 u[IU]/mL — ABNORMAL LOW (ref 0.350–4.500)

## 2018-09-02 LAB — FOLATE: Folate: 18.4 ng/mL (ref >5.9)

## 2018-09-02 LAB — FERRITIN: Ferritin: 500 ng/mL — ABNORMAL HIGH (ref 24–336)

## 2018-09-02 LAB — AMMONIA: Ammonia: 26 umol/L (ref 9–35)

## 2018-09-02 MED ORDER — BRINZOLAMIDE 1 % OP SUSP
1.0000 [drp] | Freq: Two times a day (BID) | OPHTHALMIC | Status: DC
  Administered 2018-09-02 – 2018-09-03 (×3): 1 [drp] via OPHTHALMIC
  Filled 2018-09-02: qty 10

## 2018-09-02 MED ORDER — HYDRALAZINE HCL 20 MG/ML IJ SOLN
10.0000 mg | INTRAMUSCULAR | Status: DC | PRN
  Administered 2018-09-02: 10 mg via INTRAVENOUS
  Filled 2018-09-02: qty 1

## 2018-09-02 MED ORDER — HYDRALAZINE HCL 20 MG/ML IJ SOLN: 5.0000 mg | INTRAMUSCULAR | Status: DC | PRN

## 2018-09-02 MED ORDER — HYDRALAZINE HCL 50 MG PO TABS
50.0000 mg | ORAL_TABLET | Freq: Three times a day (TID) | ORAL | Status: DC
  Administered 2018-09-02 – 2018-09-03 (×2): 50 mg via ORAL
  Filled 2018-09-02 (×2): qty 1

## 2018-09-02 MED ORDER — BRIMONIDINE TARTRATE 0.2 % OP SOLN
1.0000 [drp] | Freq: Two times a day (BID) | OPHTHALMIC | Status: DC
  Administered 2018-09-02 – 2018-09-03 (×3): 1 [drp] via OPHTHALMIC
  Filled 2018-09-02: qty 5

## 2018-09-02 MED ORDER — AMLODIPINE BESYLATE 10 MG PO TABS
10.0000 mg | ORAL_TABLET | Freq: Every day | ORAL | Status: DC
  Administered 2018-09-02 – 2018-09-03 (×2): 10 mg via ORAL
  Filled 2018-09-02 (×2): qty 1

## 2018-09-02 NOTE — Progress Notes (Signed)
Attempted to get this pt for MRI, RN stated he could not leave the floor and accompany pt. I asked him to call us when he was ready.

## 2018-09-02 NOTE — Evaluation (Signed)
Physical Therapy Evaluation Patient Details Name: Jeffrey Campbell MRN: [id number] DOB: 1932-11-05 Today's Date: 09/02/2018   History of Present Illness  Pt adm with acute encephalopathy. PMH - dementia, chf, cad, pulmonary htn, osa, ckd,   Clinical Impression  Pt presents to PT with slightly unsteady gait that appears close to baseline. Recommend return home with family. Will follow acutely but doubt PT needed at DC.     Follow Up Recommendations No PT follow up    Equipment Recommendations  None recommended by PT    Recommendations for Other Services       Precautions / Restrictions Precautions Precautions: Fall      Mobility  Bed Mobility Overal bed mobility: Modified Independent             General bed mobility comments: incr time  Transfers Overall transfer level: Needs assistance Equipment used: Rolling walker (2 wheeled);None Transfers: Sit to/from Stand Sit to Stand: Min assist;Supervision         General transfer comment: min assist on first stand but then supervision on subsequent stands  Ambulation/Gait Ambulation/Gait assistance: Min guard Gait Distance (Feet): 250 Feet Assistive device: Rolling walker (2 wheeled);None Gait Pattern/deviations: Step-through pattern;Decreased stride length;Drifts right/left Gait velocity: decr Gait velocity interpretation: 1.31 - 2.62 ft/sec, indicative of limited community ambulator General Gait Details: slightly unsteady gait with min guard for safety and lines. Initially used walker but pt with difficulty steering effectively and better cadence without walker  Stairs            Wheelchair Mobility    Modified Rankin (Stroke Patients Only)       Balance Overall balance assessment: Needs assistance Sitting-balance support: No upper extremity supported;Feet supported Sitting balance-Leahy Scale: Normal     Standing balance support: No upper extremity supported;During functional activity Standing  balance-Leahy Scale: Good                               Pertinent Vitals/Pain Pain Assessment: No/denies pain    Home Living Family/patient expects to be discharged to:: Private residence Living Arrangements: Spouse/significant other Available Help at Discharge: Family;Available 24 hours/day Type of Home: House Home Access: Level entry     Home Layout: One level Home Equipment: Cane - single point;Shower seat;Walker - 2 wheels Additional Comments: Above from prior encounter    Prior Function Level of Independence: Needs assistance   Gait / Transfers Assistance Needed: modified independent with gait. Reports he doesn't use walker           Hand Dominance   Dominant Hand: Right    Extremity/Trunk Assessment   Upper Extremity Assessment Upper Extremity Assessment: Overall WFL for tasks assessed    Lower Extremity Assessment Lower Extremity Assessment: Generalized weakness       Communication   Communication: No difficulties  Cognition Arousal/Alertness: Awake/alert Behavior During Therapy: WFL for tasks assessed/performed Overall Cognitive Status: No family/caregiver present to determine baseline cognitive functioning                                 General Comments: Pt with baseline dementia      General Comments      Exercises     Assessment/Plan    PT Assessment Patient needs continued PT services  PT Problem List Decreased strength;Decreased balance;Decreased mobility       PT Treatment Interventions DME instruction;Gait training;Functional mobility  training;Therapeutic activities;Therapeutic exercise;Balance training;Patient/family education    PT Goals (Current goals can be found in the Care Plan section)  Acute Rehab PT Goals Patient Stated Goal: go home PT Goal Formulation: With patient Time For Goal Achievement: 09/09/18 Potential to Achieve Goals: Good    Frequency Min 3X/week   Barriers to discharge         Co-evaluation               AM-PAC PT "6 Clicks" Mobility  Outcome Measure Help needed turning from your back to your side while in a flat bed without using bedrails?: None Help needed moving from lying on your back to sitting on the side of a flat bed without using bedrails?: None Help needed moving to and from a bed to a chair (including a wheelchair)?: A Little Help needed standing up from a chair using your arms (e.g., wheelchair or bedside chair)?: A Little Help needed to walk in hospital room?: A Little Help needed climbing 3-5 steps with a railing? : A Little 6 Click Score: 20    End of Session Equipment Utilized During Treatment: Gait belt Activity Tolerance: Patient tolerated treatment well Patient left: in chair;with call bell/phone within reach;with chair alarm set Nurse Communication: Mobility status PT Visit Diagnosis: Unsteadiness on feet (R26.81)    Time: 1450-1510 PT Time Calculation (min) (ACUTE ONLY): 20 min   Charges:              [place] Pager [phone] Office [place] 09/02/2018, 3:33 PM

## 2018-09-02 NOTE — Progress Notes (Signed)
Pt's daughter called, states she was told by admitting physician that she could visit the patient today due to patient's underlying dementia. Discussed with charge nurse, attending physician and administrative coordinator [facility]) and due to current visitor restrictions patient does not meet criteria for visitor's at this time.  Patient's daughter notified. Pt's daughter upset. Pt's daughter spoke with charge nurse, [facility] and attending physician, after which she verbalized understanding of the current visitation policy. Pt's family requests that RN and/or attending physician provide daily updates to patient's daughter and/or wife and to report any changes in patient's condition. Patient's daughter agreeable to plan at this time.    Lenna Sciara, RN, BSN

## 2018-09-02 NOTE — Significant Event (Signed)
Rapid Response Event Note  While rounding on floor, pts HR noted to be in the 30s. Provider made aware and orders to continue to monitor so long as pt asymptomatic. Pt with history of chronic bradycardia. Bedside RN instructed to call with any changes or concerns.       Sherilyn Dacosta

## 2018-09-02 NOTE — Progress Notes (Addendum)
Patient ID: Jeffrey Campbell, male   DOB: 1933/02/27, 85 y.o.   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  No fever or cough or nausea, vomiting or diarrhea reported.  On presentation, CT head was unremarkable.  Sodium was 146.  Chest x-ray showed stable cardiomegaly.  Urinalysis was unremarkable.  Blood pressure was on the lower side with mild hypothymia.  He was kept under observation for acute encephalopathy and further work-up.  Assessment & Plan:   Principal Problem:   Acute encephalopathy Active Problems:   OSA (obstructive sleep apnea)   Hypothyroidism   Type 2 diabetes, diet controlled (HCC)   COPD (chronic obstructive pulmonary disease) (HCC)   Bradycardia   Pulmonary hypertension (HCC)   Chronic diastolic CHF (congestive heart failure) (HCC)   Vascular dementia with behavioral disturbance (HCC)   CKD (chronic kidney disease), stage V ([place])   Hypothermia  Acute encephalopathy in a patient with vascular dementia Hypothermia -Questionable cause.  MRI of the brain done this morning is negative for acute stroke.  CT of the brain was negative as well on presentation.  Might be secondary to waxing and waning of his dementia and probably worsening dementia. -Patient is currently awake but confused.  Unclear if this is his baseline mental status. -Continue neurochecks. -Fall precautions. -Currently not hypothermic -Currently on room air -PT/SLP evaluation. -Normal folate and elevated vitamin B12 level.  Slightly low TSH level.  Will get free T3 and T4. -SARS-CoV-2 testing was negative -chest x-ray  showed stable cardiomegaly and  pulmonary congestion without overt edema -Palliative care consult for goals of care discussion.  Patient is still full code.  Hypotension History of hypertension -Unclear etiology.  Does not look like he has any evidence of infection.  Currently on empiric antibiotics.  Will DC vancomycin.  Continue cefepime for 1 more day, if no evidence of infection, will DC cefepime tomorrow.  Blood pressure still on the lower side.  Monitor.  Antihypertensives on hold.  Chronic bradycardia -Seems to be chronic.  Patient has been evaluated by cardiology in the past.  Outpatient follow-up with cardiology.  Monitor heart rate.  Avoid AV node blocking agent.  Chronic kidney disease stage IV/V with metabolic acidosis -Creatinine at baseline.  Monitor.  Hypothyroidism -TSH slightly low.  Will decrease levothyroxine to 100 mcg daily.  Chronic diastolic CHF/pulmonary hypertension -Echo in 08/30/2017 showed EF of 60 to 65% with grade 2 diastolic dysfunction. -Currently no signs of volume overload.  Strict input and output.  Daily weights.  Sleep apnea -Does not use CPAP  Diabetes mellitus type 2 -Monitor CBGs.  Thrombocytopenia -Probably chronic.  No signs of bleeding.  Monitor  DVT prophylaxis: Heparin Code Status: Full  family Communication: Spoke with [place]/wife on phone on 09/02/18  Disposition Plan: Home in 1 to 2 days if clinically improves  Consultants: Consulted palliative care  Procedures: None  Antimicrobials: Vancomycin and cefepime from 09/01/2018 onwards   Subjective: Patient seen and examined at bedside.  He is awake but confused.  Does not know why he is in the hospital.  No overnight fever or vomiting reported by nursing staff.  Objective: Vitals:   09/02/18 0001 09/02/18 0413  09/02/18 0738 09/02/18 1242  BP: (!) 152/102 (!) 197/63 94/70 106/87  Pulse: 92 (!) 56 69 93  Resp:   (!) 21 15  Temp: 97.8 F (36.6 C) 98.2 F (36.8 C) 98.2 F (36.8  C) 98.6 F (37 C)  TempSrc: Oral Oral Oral Oral  SpO2: 98% 97% 97% 98%  Weight:      Height:        Intake/Output Summary (Last 24 hours) at 09/02/2018 1311 Last data filed at 09/02/2018 0930 Gross per 24 hour  Intake 740 ml  Output 975 ml  Net -235 ml   Filed Weights   09/01/18 1716  Weight: 61.2 kg    Examination:  General exam: Appears calm and comfortable.  Elderly male lying in bed.  Awake but confused. Respiratory system: Bilateral decreased breath sounds at bases with some scattered crackles Cardiovascular system: S1 & S2 heard, intermittent bradycardia Gastrointestinal system: Abdomen is nondistended, soft and nontender. Normal bowel sounds heard. Extremities: No cyanosis, clubbing, edema  Central nervous system: Awake, confused. No focal neurological deficits. Moving extremities   Data Reviewed: I have personally reviewed following labs and imaging studies  CBC: Recent Labs  Lab 09/01/18 1800 09/02/18 0015 09/02/18 0358  WBC 4.5 4.7 5.3  NEUTROABS  --   --  3.8  HGB 11.0* 10.9* 10.4*  HCT 34.9* 34.1* 32.6*  MCV 100.3* 99.4 99.1  PLT 132* 132* 132*   Basic Metabolic Panel: Recent Labs  Lab 09/01/18 1800 09/02/18 0015 09/02/18 0358  NA 146*  --  146*  K 4.0  --  3.6  CL 119*  --  118*  CO2 21*  --  18*  GLUCOSE 140*  --  111*  BUN 77*  --  73*  CREATININE 4.02* 4.00* 3.84*  CALCIUM 10.2  --  9.9  MG 2.2  --   --    GFR: Estimated Creatinine Clearance: 12.6 mL/min (A) (by C-G formula based on SCr of 3.84 mg/dL (H)). Liver Function Tests: Recent Labs  Lab 09/01/18 1800 09/02/18 0358  AST 44* 34  ALT 65* 54*  ALKPHOS 74 65  BILITOT 1.4* 1.1  PROT 6.9 6.3*  ALBUMIN 3.9 3.7   No results for input(s): LIPASE, AMYLASE in the last 168 hours. Recent Labs  Lab 09/02/18 0015  AMMONIA 26   Coagulation Profile: No results for input(s): INR, PROTIME in the last 168 hours. Cardiac Enzymes: Recent Labs  Lab 09/01/18 1800 09/02/18 0015  09/02/18 0358 09/02/18 1007  TROPONINI 0.07* 0.06* 0.06* 0.06*   BNP (last 3 results) Recent Labs    09/06/17 0850  PROBNP 2,000*   HbA1C: No results for input(s): HGBA1C in the last 72 hours. CBG: Recent Labs  Lab 09/01/18 1705 09/02/18 0735 09/02/18 1238  GLUCAP 141* 97 133*   Lipid Profile: No results for input(s): CHOL, HDL, LDLCALC, TRIG, CHOLHDL, LDLDIRECT in the last 72 hours. Thyroid Function Tests: Recent Labs    09/02/18 0015  TSH 0.183*   Anemia Panel: Recent Labs    09/02/18 0015 09/02/18 0023  VITAMINB12 1,095*  --   FOLATE  --  18.4  FERRITIN 500*  --   TIBC 203*  --   IRON 32*  --   RETICCTPCT 2.0  --    Sepsis Labs: Recent Labs  Lab 09/02/18 0015 09/02/18 0358  LATICACIDVEN 0.7 0.6    Recent Results (from the past 240 hour(s))  SARS Coronavirus 2 (CEPHEID - Performed in [facility] hospital lab), [facility]  Status: None   Collection Time: 09/01/18  9:07 PM  Result Value Ref Range Status   SARS Coronavirus 2 NEGATIVE NEGATIVE Final    Comment: (NOTE) If result is NEGATIVE SARS-CoV-2 target nucleic acids are NOT DETECTED. The SARS-CoV-2 RNA is generally detectable in upper and lower  respiratory specimens during the acute phase of infection. The lowest  concentration of SARS-CoV-2 viral copies this assay can detect is 250  copies / mL. A negative result does not preclude SARS-CoV-2 infection  and should not be used as the sole basis for treatment or other  patient management decisions.  A negative result may occur with  improper specimen collection / handling, submission of specimen other  than nasopharyngeal swab, presence of viral mutation(s) within the  areas targeted by this assay, and inadequate number of viral copies  (<250 copies / mL). A negative result must be combined with clinical  observations, patient history, and epidemiological information. If result is POSITIVE SARS-CoV-2 target nucleic acids are DETECTED. The  SARS-CoV-2 RNA is generally detectable in upper and lower  respiratory specimens dur ing the acute phase of infection.  Positive  results are indicative of active infection with SARS-CoV-2.  Clinical  correlation with patient history and other diagnostic information is  necessary to determine patient infection status.  Positive results do  not rule out bacterial infection or co-infection with other viruses. If result is PRESUMPTIVE POSTIVE SARS-CoV-2 nucleic acids MAY BE PRESENT.   A presumptive positive result was obtained on the submitted specimen  and confirmed on repeat testing.  While 2019 novel coronavirus  (SARS-CoV-2) nucleic acids may be present in the submitted sample  additional confirmatory testing may be necessary for epidemiological  and / or clinical management purposes  to differentiate between  SARS-CoV-2 and other Sarbecovirus currently known to infect humans.  If clinically indicated additional testing with an alternate test  methodology [phone]) is advised. The SARS-CoV-2 RNA is generally  detectable in upper and lower respiratory sp ecimens during the acute  phase of infection. The expected result is Negative. Fact Sheet for Patients:  StrictlyIdeas.no Fact Sheet for Healthcare Providers: BankingDealers.co.za This test is not yet approved or cleared by the [place] FDA and has been authorized for detection and/or diagnosis of SARS-CoV-2 by FDA under an Emergency Use Authorization (EUA).  This EUA will remain in effect (meaning this test can be used) for the duration of the COVID-19 declaration under Section 564(b)(1) of the Act, 21 U.S.C. section 360bbb-3(b)(1), unless the authorization is terminated or revoked sooner. Performed at [facility], [place] [address], [place], [place] [address]   Culture, blood (routine x 2)     Status: None (Preliminary result)   Collection Time: 09/02/18 12:15 AM  Result  Value Ref Range Status   Specimen Description BLOOD RIGHT ARM  Final   Special Requests   Final    BOTTLES DRAWN AEROBIC ONLY Blood Culture results may not be optimal due to an excessive volume of blood received in culture bottles Performed at [facility] [address], [place], [place] [address]    Culture PENDING  Incomplete   Report Status PENDING  Incomplete  Culture, blood (routine x 2)     Status: None (Preliminary result)   Collection Time: 09/02/18 12:25 AM  Result Value Ref Range Status   Specimen Description BLOOD RIGHT HAND  Final   Special Requests   Final    BOTTLES DRAWN AEROBIC ONLY Blood Culture results may not  be optimal due to an excessive volume of blood received in culture bottles Performed at [facility] [address], [place], [place] [address]    Culture PENDING  Incomplete   Report Status PENDING  Incomplete         Radiology Studies: Ct Head Wo Contrast  Result Date: 09/01/2018 CLINICAL DATA:  Altered level of consciousness/confusion EXAM: CT HEAD WITHOUT CONTRAST TECHNIQUE: Contiguous axial images were obtained from the base of the skull through the vertex without intravenous contrast. COMPARISON:  May 20, 2018 FINDINGS: Brain: Moderate diffuse atrophy is stable. There is no intracranial mass, hemorrhage, extra-axial fluid collection, or midline shift. There is patchy small vessel disease throughout the centra semiovale bilaterally. No acute infarct is demonstrable. Vascular: No hyperdense vessel. There is calcification in each distal vertebral artery and in the carotid siphon regions bilaterally. Skull: The bony calvarium appears intact. Sinuses/Orbits: There is mucosal thickening in each maxillary antrum. There is mucosal thickening in several ethmoid air cells. Orbits appear symmetric bilaterally. Other: Apparent small skin nevus over the right frontal-parietal junction region. Mastoid air cells are clear. IMPRESSION: Stable atrophy with  periventricular small vessel disease. No acute infarct evident. No mass or hemorrhage. There are foci of arterial vascular calcification. There are foci of paranasal sinus disease at several sites. Electronically Signed   By: Lowella Grip III M.D.   On: 09/01/2018 19:46   Mr Brain Wo Contrast  Result Date: 09/02/2018 CLINICAL DATA:  Unexplained altered level of consciousness. EXAM: MRI HEAD WITHOUT CONTRAST TECHNIQUE: Multiplanar, multiecho pulse sequences of the brain and surrounding structures were obtained without intravenous contrast. COMPARISON:  CT head 09/01/2018. MR head 09/12/2016. FINDINGS: Brain: No acute infarction, hemorrhage, hydrocephalus, extra-axial collection or mass lesion. Generalized atrophy. Extensive T2 and FLAIR hyperintensities throughout the white matter, likely small vessel disease. Vascular: Flow voids are maintained. Skull and upper cervical spine: Normal skull base marrow signal. Mild cervical spondylosis, incompletely evaluated. Sinuses/Orbits: No significant sinus disease. BILATERAL exophthalmos, in this patient with history of hypothyroidism. There is prominence of the intraconal fat which appears more contributory than muscle enlargement. Other: None. IMPRESSION: Atrophy and small vessel disease, similar to priors. No acute intracranial findings. Electronically Signed   By: Staci Righter M.D.   On: 09/02/2018 12:39   Dg Chest Port 1 View  Result Date: 09/01/2018 CLINICAL DATA:  Weakness. EXAM: PORTABLE CHEST 1 VIEW COMPARISON:  Chest x-ray dated May 20, 2018. FINDINGS: Stable cardiomegaly. Normal mediastinal contours. Persistent pulmonary vascular congestion. No focal consolidation, pleural effusion, or pneumothorax. No acute osseous abnormality. IMPRESSION: Stable cardiomegaly and pulmonary vascular congestion without overt edema. Electronically Signed   By: Titus Dubin M.D.   On: 09/01/2018 17:56        Scheduled Meds:  aspirin EC  81 mg Oral Daily    atorvastatin  20 mg Oral Daily   calcitRIOL  0.25 mcg Oral Daily   heparin  5,000 Units Subcutaneous Q8H   insulin aspart  0-9 Units Subcutaneous TID WC   levothyroxine  150 mcg Oral QAC breakfast   multivitamin with minerals  1 tablet Oral Daily   Continuous Infusions:  sodium chloride     ceFEPime (MAXIPIME) IV 2 g (09/02/18 0102)   vancomycin 750 mg (09/02/18 0153)     LOS: 0 days        Aline August, MD [facility] 09/02/2018, 1:11 PM

## 2018-09-03 ENCOUNTER — Encounter (HOSPITAL_COMMUNITY): Payer: Self-pay | Admitting: Primary Care

## 2018-09-03 DIAGNOSIS — Z515 Encounter for palliative care: Secondary | ICD-10-CM

## 2018-09-03 DIAGNOSIS — Z7189 Other specified counseling: Secondary | ICD-10-CM

## 2018-09-03 LAB — CBC WITH DIFFERENTIAL/PLATELET
Abs Immature Granulocytes: 0.02 10*3/uL (ref 0.00–0.07)
Basophils Absolute: 0 10*3/uL (ref 0.0–0.1)
Basophils Relative: 0 %
Eosinophils Absolute: 0.1 10*3/uL (ref 0.0–0.5)
Eosinophils Relative: 2 %
HCT: 32.5 % — ABNORMAL LOW (ref 39.0–52.0)
Hemoglobin: 10.5 g/dL — ABNORMAL LOW (ref 13.0–17.0)
Immature Granulocytes: 0 %
Lymphocytes Relative: 18 %
Lymphs Abs: 1 10*3/uL (ref 0.7–4.0)
MCH: 31.4 pg (ref 26.0–34.0)
MCHC: 32.3 g/dL (ref 30.0–36.0)
MCV: 97.3 fL (ref 80.0–100.0)
Monocytes Absolute: 0.5 10*3/uL (ref 0.1–1.0)
Monocytes Relative: 9 %
Neutro Abs: 3.6 10*3/uL (ref 1.7–7.7)
Neutrophils Relative %: 71 %
Platelets: 126 10*3/uL — ABNORMAL LOW (ref 150–400)
RBC: 3.34 MIL/uL — ABNORMAL LOW (ref 4.22–5.81)
RDW: 15.1 % (ref 11.5–15.5)
WBC: 5.2 10*3/uL (ref 4.0–10.5)
nRBC: 0 % (ref 0.0–0.2)

## 2018-09-03 LAB — AMMONIA: Ammonia: 26 umol/L (ref 9–35)

## 2018-09-03 LAB — COMPREHENSIVE METABOLIC PANEL
ALT: 43 U/L (ref 0–44)
AST: 26 U/L (ref 15–41)
Albumin: 3.5 g/dL (ref 3.5–5.0)
Alkaline Phosphatase: 61 U/L (ref 38–126)
Anion gap: 10 (ref 5–15)
BUN: 64 mg/dL — ABNORMAL HIGH (ref 8–23)
CO2: 21 mmol/L — ABNORMAL LOW (ref 22–32)
Calcium: 10.2 mg/dL (ref 8.9–10.3)
Chloride: 113 mmol/L — ABNORMAL HIGH (ref 98–111)
Creatinine, Ser: 3.37 mg/dL — ABNORMAL HIGH (ref 0.61–1.24)
GFR calc Af Amer: 18 mL/min — ABNORMAL LOW (ref >60)
GFR calc non Af Amer: 16 mL/min — ABNORMAL LOW (ref >60)
Glucose, Bld: 95 mg/dL (ref 70–99)
Potassium: 3.7 mmol/L (ref 3.5–5.1)
Sodium: 144 mmol/L (ref 135–145)
Total Bilirubin: 1.3 mg/dL — ABNORMAL HIGH (ref 0.3–1.2)
Total Protein: 6.4 g/dL — ABNORMAL LOW (ref 6.5–8.1)

## 2018-09-03 LAB — GLUCOSE, CAPILLARY
Glucose-Capillary: 89 mg/dL (ref 70–99)
Glucose-Capillary: 117 mg/dL — ABNORMAL HIGH (ref 70–99)

## 2018-09-03 LAB — MAGNESIUM: Magnesium: 1.9 mg/dL (ref 1.7–2.4)

## 2018-09-03 MED ORDER — HALOPERIDOL LACTATE 5 MG/ML IJ SOLN
1.0000 mg | Freq: Four times a day (QID) | INTRAMUSCULAR | Status: DC | PRN
  Administered 2018-09-03: 2 mg via INTRAVENOUS
  Filled 2018-09-03: qty 1

## 2018-09-03 MED ORDER — FUROSEMIDE 40 MG PO TABS: 40.0000 mg | ORAL_TABLET | Freq: Every day | ORAL | 0 refills | Status: DC

## 2018-09-03 MED ORDER — LOSARTAN POTASSIUM 50 MG PO TABS
50.0000 mg | ORAL_TABLET | Freq: Every day | ORAL | Status: DC
  Administered 2018-09-03: 10:00:00 50 mg via ORAL
  Filled 2018-09-03: qty 1

## 2018-09-03 MED ORDER — FUROSEMIDE 40 MG PO TABS: 40.0000 mg | ORAL_TABLET | Freq: Every day | ORAL | Status: DC

## 2018-09-03 MED ORDER — LEVOTHYROXINE SODIUM 100 MCG PO TABS: 100.0000 ug | ORAL_TABLET | Freq: Every day | ORAL | Status: DC

## 2018-09-03 MED ORDER — QUETIAPINE FUMARATE 25 MG PO TABS: 12.5000 mg | ORAL_TABLET | Freq: Every day | ORAL | 0 refills | Status: DC

## 2018-09-03 NOTE — Progress Notes (Signed)
Physical Therapy Treatment Patient Details Name: CAIDON FOTI MRN: [id number] DOB: Feb 14, 1933 Today's Date: 09/03/2018    History of Present Illness Pt adm with acute encephalopathy. PMH - dementia, chf, cad, pulmonary htn, osa, ckd,     PT Comments    Continuing work on functional mobility and activity tolerance;  Did not walk as far because Mr. Merryfield did a lot of starting and stopping during attemtps at hallway ambulation; Occasional small losses of balance needing min assist to steady; Ultimately, I believe he will do better at home with familiar environment, routine, and caregivers; Worth considering HHPT to get a good idea of his function in his own home, a familiar environment  Follow Up Recommendations  Home health PT;Other (comment)(May consider HHPT to encourage further gait and balance training in his own familiar environment)     Equipment Recommendations  Cane;Other (comment)(he may already have one at home)    Recommendations for Other Services       Precautions / Restrictions Precautions Precautions: Fall Restrictions Weight Bearing Restrictions: No    Mobility  Bed Mobility Overal bed mobility: Modified Independent             General bed mobility comments: incr time, and encouragement to initiate  Transfers Overall transfer level: Needs assistance Equipment used: 1 person hand held assist Transfers: Sit to/from Stand Sit to Stand: Min assist         General transfer comment: Min assist to steady  Ambulation/Gait Ambulation/Gait assistance: Min guard;Min assist Gait Distance (Feet): 50 Feet(lots of starts and stops with hallway ambulation) Assistive device: 1 person hand held assist;Straight cane Gait Pattern/deviations: Step-through pattern;Decreased stride length;Drifts right/left Gait velocity: decr   General Gait Details: Ntoed difficulty with RW yesterday, and opted to try cane today; Lots of encouragement to get Mr. Harriger to walk; min  assist occasionally needed to steady due to multiple slight balance losses; Lots of starting and stopping during walk today; Mr. Valliant stopped a lot, hesitant to walk in the hallway; He did get to where he used the cane pretty well   Stairs             Wheelchair Mobility    Modified Rankin (Stroke Patients Only)       Balance     Sitting balance-Leahy Scale: Normal       Standing balance-Leahy Scale: Fair                              Cognition Arousal/Alertness: Awake/alert Behavior During Therapy: WFL for tasks assessed/performed Overall Cognitive Status: No family/caregiver present to determine baseline cognitive functioning                                 General Comments: Pt with baseline dementia      Exercises      General Comments General comments (skin integrity, edema, etc.): VSS      Pertinent Vitals/Pain Pain Assessment: No/denies pain    Home Living                      Prior Function            PT Goals (current goals can now be found in the care plan section) Acute Rehab PT Goals Patient Stated Goal: go home; at one point in session, he indicated he wants to look out the  windows PT Goal Formulation: With patient Time For Goal Achievement: 09/09/18 Potential to Achieve Goals: Good Progress towards PT goals: Not progressing toward goals - comment(lots of start/stopping with walk with PT)    Frequency    Min 3X/week      PT Plan Discharge plan needs to be updated    Co-evaluation              AM-PAC PT "6 Clicks" Mobility   Outcome Measure  Help needed turning from your back to your side while in a flat bed without using bedrails?: None Help needed moving from lying on your back to sitting on the side of a flat bed without using bedrails?: None Help needed moving to and from a bed to a chair (including a wheelchair)?: A Little Help needed standing up from a chair using your arms (e.g.,  wheelchair or bedside chair)?: A Little Help needed to walk in hospital room?: A Little Help needed climbing 3-5 steps with a railing? : A Little 6 Click Score: 20    End of Session Equipment Utilized During Treatment: Gait belt Activity Tolerance: Patient tolerated treatment well Patient left: Other (comment)(walking in hallway with sitter and Nurse Tech) Nurse Communication: Mobility status PT Visit Diagnosis: Unsteadiness on feet (R26.81)     Time: 3888-2800 PT Time Calculation (min) (ACUTE ONLY): 21 min  Charges:  $Gait Training: 8-22 mins                     Roney Marion, PT  [place] Pager [phone] [facility] [place] 09/03/2018, 11:00 AM

## 2018-09-03 NOTE — Discharge Summary (Signed)
Physician Discharge Summary  Jeffrey Campbell:[id number] DOB: 01/21/1933 DOA: 09/01/2018  PCP: [name], MD  Admit date: 09/01/2018 Discharge date: 09/03/2018  Admitted From: Home Disposition: Home  Recommendations for Outpatient Follow-up:  1. Follow up with PCP in 1 week with repeat CBC/BMP 2. Outpatient evaluation and follow-up by palliative care at earliest convenience 3. Outpatient follow-up with nephrology 4. 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  No fever or cough or nausea, vomiting or diarrhea reported.  On presentation, CT head was unremarkable.  Sodium was 146.  Chest x-ray showed stable cardiomegaly.  Urinalysis was unremarkable.  Blood pressure was on the lower side with mild hypothymia.  He was kept under observation for acute encephalopathy and further work-up.  He was initially started on broad-spectrum antibiotics but antibiotics were subsequently discontinued after no evidence of infection.  MRI of the brain was negative for any acute stroke.  He had episodes of agitation and required Haldol.  He is back to his baseline mental status.  After speaking to daughter, he will be started on low-dose Seroquel.  He will be discharged home.  Overall prognosis is guarded to poor.  Palliative care evaluation is pending.  Recommend outpatient palliative care evaluation and follow-up and consider making patient DNR.  Discharge Diagnoses:  Principal Problem:   Acute  encephalopathy Active Problems:   OSA (obstructive sleep apnea)   Hypothyroidism   Type 2 diabetes, diet controlled (HCC)   COPD (chronic obstructive pulmonary disease) (HCC)   Bradycardia   Pulmonary hypertension (HCC)   Chronic diastolic CHF (congestive heart failure) (HCC)   Vascular dementia with behavioral disturbance (HCC)   CKD (chronic kidney disease), stage V ([place])   Hypothermia  Acute encephalopathy in a patient with vascular dementia Hypothermia -Questionable cause.  MRI of the brain done this morning is negative for acute stroke.  CT of the brain was negative as well on presentation.  Might be secondary to waxing and waning of his dementia and probably worsening dementia. -Continue neurochecks. -Fall precautions. -Currently not hypothermic -Currently on room air -PT/SLP evaluation. -Normal folate and elevated vitamin B12 level.  Slightly low TSH level.  -SARS-CoV-2 testing was negative -chest x-ray showed stable cardiomegaly and  pulmonary congestion without overt edema -Palliative care consult for goals of care discussion is pending.  Patient is still full code.  Discussed CODE STATUS with family and recommend considering making patient to DNR.  Overall prognosis is guarded to poor.  Recommend outpatient evaluation and follow-up by palliative care. -This morning, patient was very agitated and had to be given intravenous Haldol.  Monitor mental status.  His symptoms sound more like dementia with worsening symptoms and delirium.  He would be better off discharge home as soon has his mental status is slightly better.  Spoke to daughter [name] on phone.  After Haldol, patient was sleepy. Subsequently his mental status improved but is again getting more restless.  Spoke to [place] again on phone and she agreed that he would be better off at home.  She is agreeable for low-dose Seroquel to be  started at bedtime.  Monitor symptoms.  The dose can be increased outpatient by  PCP. -Might benefit from outpatient neurology evaluation and follow-up.  Hypotension History of hypertension -Unclear etiology.  Does not look like he has any evidence of infection.  Empirically started on vancomycin and cefepime.  Vancomycin discontinued on 09/02/2018.  Will DC cefepime as well.  No evidence of any infection.   -Blood pressure currently is on the higher side.  Monitor.  Will resume home antihypertensive regimen but decrease Lasix to 40 mg daily.  Chronic bradycardia -Seems to be chronic.  Patient has been evaluated by cardiology in the past.  Outpatient follow-up with cardiology.  Monitor heart rate.  Avoid AV node blocking agent.  Chronic kidney disease stage IV/V with metabolic acidosis -Creatinine at baseline.  Monitor.  Hypothyroidism -TSH slightly low.    Continue levothyroxine.  Chronic diastolic CHF/pulmonary hypertension -Echo in 08/30/2017 showed EF of 60 to 65% with grade 2 diastolic dysfunction. -Currently no signs of volume overload.  Strict input and output.  Daily weights. -Resume Lasix but at a lower dose 40 mg daily.  Sleep apnea -Does not use CPAP  Diabetes mellitus type 2 -Outpatient follow-up.  Thrombocytopenia -Probably chronic.  No signs of bleeding.  Outpatient follow-up. Discharge Instructions  Discharge Instructions    Diet - low sodium heart healthy   Complete by:  As directed    Increase activity slowly   Complete by:  As directed      Allergies as of 09/03/2018      Reactions   Iodinated Diagnostic Agents Other (See Comments)   Right heart cath 07/2016 allegedly "shut down his kidneys" Patient has stage III chronic kidney disease   Zocor [simvastatin] Other (See Comments)   Liver problems    Budesonide-formoterol Fumarate Other (See Comments)   Dry mouth   Minoxidil Other (See Comments)   Tiotropium Bromide Monohydrate Other (See Comments)   Dry mouth   Tricor [fenofibrate]          Medication List    TAKE these  medications   amLODipine 10 MG tablet Commonly known as:  NORVASC Take 10 mg by mouth daily.   aspirin EC 81 MG tablet Take 81 mg by mouth daily.   atorvastatin 20 MG tablet Commonly known as:  LIPITOR TAKE 1 TABLET BY MOUTH EVERY DAY   calcitRIOL 0.25 MCG capsule Commonly known as:  ROCALTROL Take 0.25 mcg by mouth daily.   doxazosin 4 MG tablet Commonly known as:  CARDURA Take 4 mg by mouth daily.   furosemide 40 MG tablet Commonly known as:  LASIX Take 1 tablet (40 mg total) by mouth daily. What changed:  how much to take   hydrALAZINE 100 MG tablet Commonly known as:  APRESOLINE Take 1 tablet (100 mg total) by mouth 3 (three) times daily. Please make yearly appt with Dr. Radford Pax for October. 1st attempt What changed:  additional instructions   levothyroxine 150 MCG tablet Commonly known as:  SYNTHROID Take 150 mcg by mouth daily before breakfast.   losartan 50 MG tablet Commonly known as:  COZAAR Take 50 mg by mouth daily.   multivitamin with minerals Tabs tablet Take 1 tablet by mouth daily.   PROCRIT IJ Inject 5,000 Units as directed every 7 (seven) days.   QUEtiapine 25 MG tablet Commonly known as:  SEROquel Take 0.5 tablets (12.5 mg total) by mouth at bedtime.   Simbrinza 1-0.2 % Susp Generic drug:  Brinzolamide-Brimonidine Place 1 drop into both  eyes 2 (two) times daily.      Follow-up Information    [name], MD. Schedule an appointment as soon as possible for a visit in 1 week(s).   Specialty:  Family Medicine Why:  With repeat CBC/BMP Contact information: [place] [address] [phone]        Sueanne Margarita, MD .   Specialty:  Cardiology Contact information: [address] [place] [address] [phone]        Palliative care Follow up.   Why:  At earliest convenience         Allergies  Allergen Reactions  . Iodinated Diagnostic Agents Other (See Comments)    Right heart  cath 07/2016 allegedly "shut down his kidneys" Patient has stage III chronic kidney disease  . Zocor [Simvastatin] Other (See Comments)    Liver problems   . Budesonide-Formoterol Fumarate Other (See Comments)    Dry mouth  . Minoxidil Other (See Comments)  . Tiotropium Bromide Monohydrate Other (See Comments)    Dry mouth  . Tricor [Fenofibrate]          Consultations:  Palliative care evaluation pending.   Procedures/Studies: Ct Head Wo Contrast  Result Date: 09/01/2018 CLINICAL DATA:  Altered level of consciousness/confusion EXAM: CT HEAD WITHOUT CONTRAST TECHNIQUE: Contiguous axial images were obtained from the base of the skull through the vertex without intravenous contrast. COMPARISON:  May 20, 2018 FINDINGS: Brain: Moderate diffuse atrophy is stable. There is no intracranial mass, hemorrhage, extra-axial fluid collection, or midline shift. There is patchy small vessel disease throughout the centra semiovale bilaterally. No acute infarct is demonstrable. Vascular: No hyperdense vessel. There is calcification in each distal vertebral artery and in the carotid siphon regions bilaterally. Skull: The bony calvarium appears intact. Sinuses/Orbits: There is mucosal thickening in each maxillary antrum. There is mucosal thickening in several ethmoid air cells. Orbits appear symmetric bilaterally. Other: Apparent small skin nevus over the right frontal-parietal junction region. Mastoid air cells are clear. IMPRESSION: Stable atrophy with periventricular small vessel disease. No acute infarct evident. No mass or hemorrhage. There are foci of arterial vascular calcification. There are foci of paranasal sinus disease at several sites. Electronically Signed   By: Lowella Grip III M.D.   On: 09/01/2018 19:46   Mr Brain Wo Contrast  Result Date: 09/02/2018 CLINICAL DATA:  Unexplained altered level of consciousness. EXAM: MRI HEAD WITHOUT CONTRAST TECHNIQUE: Multiplanar, multiecho pulse  sequences of the brain and surrounding structures were obtained without intravenous contrast. COMPARISON:  CT head 09/01/2018. MR head 09/12/2016. FINDINGS: Brain: No acute infarction, hemorrhage, hydrocephalus, extra-axial collection or mass lesion. Generalized atrophy. Extensive T2 and FLAIR hyperintensities throughout the white matter, likely small vessel disease. Vascular: Flow voids are maintained. Skull and upper cervical spine: Normal skull base marrow signal. Mild cervical spondylosis, incompletely evaluated. Sinuses/Orbits: No significant sinus disease. BILATERAL exophthalmos, in this patient with history of hypothyroidism. There is prominence of the intraconal fat which appears more contributory than muscle enlargement. Other: None. IMPRESSION: Atrophy and small vessel disease, similar to priors. No acute intracranial findings. Electronically Signed   By: Staci Righter M.D.   On: 09/02/2018 12:39   Dg Chest Port 1 View  Result Date: 09/01/2018 CLINICAL DATA:  Weakness. EXAM: PORTABLE CHEST 1 VIEW COMPARISON:  Chest x-ray dated May 20, 2018. FINDINGS: Stable cardiomegaly. Normal mediastinal contours. Persistent pulmonary vascular congestion. No focal consolidation, pleural effusion, or pneumothorax. No acute osseous abnormality. IMPRESSION: Stable cardiomegaly and pulmonary  vascular congestion without overt edema. Electronically Signed   By: Titus Dubin M.D.   On: 09/01/2018 17:56       Subjective: Patient seen and examined at bedside.  He is currently sleepy, hardly wakes up on calling his name.  He was apparently extremely agitated this morning and required intravenous Haldol.  No overnight fever or vomiting. As per the nursing staff, patient has subsequently woken up and tolerated lunch and is starting to become restless again.  Discharge Exam: Vitals:   09/03/18 1108 09/03/18 1216  BP: (!) 203/61   Pulse: (!) 47   Resp: 11 18  Temp: 98.1 F (36.7 C)   SpO2: 97%    General  exam: Appears calm and comfortable.  Currently not agitated.  Elderly male.  Sleepy.   Respiratory system: Bilateral decreased breath sounds at bases with some scattered crackles.  No wheezing Cardiovascular system: S1 & S2 heard, still has intermittent bradycardia Gastrointestinal system: Abdomen is nondistended, soft and nontender. Normal bowel sounds heard. Extremities: No cyanosis, edema     The results of significant diagnostics from this hospitalization (including imaging, microbiology, ancillary and laboratory) are listed below for reference.     Microbiology: Recent Results (from the past 240 hour(s))  SARS Coronavirus 2 (CEPHEID - Performed in [facility] hospital lab), Hosp Order     Status: None   Collection Time: 09/01/18  9:07 PM  Result Value Ref Range Status   SARS Coronavirus 2 NEGATIVE NEGATIVE Final    Comment: (NOTE) If result is NEGATIVE SARS-CoV-2 target nucleic acids are NOT DETECTED. The SARS-CoV-2 RNA is generally detectable in upper and lower  respiratory specimens during the acute phase of infection. The lowest  concentration of SARS-CoV-2 viral copies this assay can detect is 250  copies / mL. A negative result does not preclude SARS-CoV-2 infection  and should not be used as the sole basis for treatment or other  patient management decisions.  A negative result may occur with  improper specimen collection / handling, submission of specimen other  than nasopharyngeal swab, presence of viral mutation(s) within the  areas targeted by this assay, and inadequate number of viral copies  (<250 copies / mL). A negative result must be combined with clinical  observations, patient history, and epidemiological information. If result is POSITIVE SARS-CoV-2 target nucleic acids are DETECTED. The SARS-CoV-2 RNA is generally detectable in upper and lower  respiratory specimens dur ing the acute phase of infection.  Positive  results are indicative of active infection  with SARS-CoV-2.  Clinical  correlation with patient history and other diagnostic information is  necessary to determine patient infection status.  Positive results do  not rule out bacterial infection or co-infection with other viruses. If result is PRESUMPTIVE POSTIVE SARS-CoV-2 nucleic acids MAY BE PRESENT.   A presumptive positive result was obtained on the submitted specimen  and confirmed on repeat testing.  While 2019 novel coronavirus  (SARS-CoV-2) nucleic acids may be present in the submitted sample  additional confirmatory testing may be necessary for epidemiological  and / or clinical management purposes  to differentiate between  SARS-CoV-2 and other Sarbecovirus currently known to infect humans.  If clinically indicated additional testing with an alternate test  methodology [phone]) is advised. The SARS-CoV-2 RNA is generally  detectable in upper and lower respiratory sp ecimens during the acute  phase of infection. The expected result is Negative. Fact Sheet for Patients:  StrictlyIdeas.no Fact Sheet for Healthcare Providers: BankingDealers.co.za This test is  not yet approved or cleared by the [place] and has been authorized for detection and/or diagnosis of SARS-CoV-2 by FDA under an Emergency Use Authorization (EUA).  This EUA will remain in effect (meaning this test can be used) for the duration of the COVID-19 declaration under Section 564(b)(1) of the Act, 21 U.S.C. section 360bbb-3(b)(1), unless the authorization is terminated or revoked sooner. Performed at [facility], [place] [address], [place], [place] [address]   Culture, blood (routine x 2)     Status: None (Preliminary result)   Collection Time: 09/02/18 12:15 AM  Result Value Ref Range Status   Specimen Description BLOOD RIGHT ARM  Final   Special Requests   Final    BOTTLES DRAWN AEROBIC ONLY Blood Culture results may not be optimal due to an  excessive volume of blood received in culture bottles   Culture   Final    NO GROWTH 1 DAY Performed at [facility], [place] [address] [place], [place] [address]    Report Status PENDING  Incomplete  Culture, blood (routine x 2)     Status: None (Preliminary result)   Collection Time: 09/02/18 12:25 AM  Result Value Ref Range Status   Specimen Description BLOOD RIGHT HAND  Final   Special Requests   Final    BOTTLES DRAWN AEROBIC ONLY Blood Culture results may not be optimal due to an excessive volume of blood received in culture bottles   Culture   Final    NO GROWTH 1 DAY Performed at [facility], [place] [address] [place], [place] [address]    Report Status PENDING  Incomplete     Labs: BNP (last 3 results) Recent Labs    05/20/18 1933  BNP 1,194.1*   Basic Metabolic Panel: Recent Labs  Lab 09/01/18 1800 09/02/18 0015 09/02/18 0358 09/03/18 0430  NA 146*  --  146* 144  K 4.0  --  3.6 3.7  CL 119*  --  118* 113*  CO2 21*  --  18* 21*  GLUCOSE 140*  --  111* 95  BUN 77*  --  73* 64*  CREATININE 4.02* 4.00* 3.84* 3.37*  CALCIUM 10.2  --  9.9 10.2  MG 2.2  --   --  1.9   Liver Function Tests: Recent Labs  Lab 09/01/18 1800 09/02/18 0358 09/03/18 0430  AST 44* 34 26  ALT 65* 54* 43  ALKPHOS 74 65 61  BILITOT 1.4* 1.1 1.3*  PROT 6.9 6.3* 6.4*  ALBUMIN 3.9 3.7 3.5   No results for input(s): LIPASE, AMYLASE in the last 168 hours. Recent Labs  Lab 09/02/18 0015 09/03/18 0430  AMMONIA 26 26   CBC: Recent Labs  Lab 09/01/18 1800 09/02/18 0015 09/02/18 0358 09/03/18 0430  WBC 4.5 4.7 5.3 5.2  NEUTROABS  --   --  3.8 3.6  HGB 11.0* 10.9* 10.4* 10.5*  HCT 34.9* 34.1* 32.6* 32.5*  MCV 100.3* 99.4 99.1 97.3  PLT 132* 132* 131* 126*   Cardiac Enzymes: Recent Labs  Lab 09/01/18 1800 09/02/18 0015 09/02/18 0358 09/02/18 1007  TROPONINI 0.07* 0.06* 0.06* 0.06*   BNP: Invalid input(s): POCBNP CBG: Recent Labs  Lab 09/02/18 1238  09/02/18 1606 09/02/18 2221 09/03/18 0744 09/03/18 1104  GLUCAP 133* 135* 140* 89 117*   D-Dimer No results for input(s): DDIMER in the last 72 hours. Hgb A1c No results for input(s): HGBA1C in the last 72 hours. Lipid Profile No results for input(s):  CHOL, HDL, LDLCALC, TRIG, CHOLHDL, LDLDIRECT in the last 72 hours. Thyroid function studies Recent Labs    09/02/18 0015  TSH 0.183*   Anemia work up Recent Labs    09/02/18 0015 09/02/18 0023  VITAMINB12 1,095*  --   FOLATE  --  18.4  FERRITIN 500*  --   TIBC 203*  --   IRON 32*  --   RETICCTPCT 2.0  --    Urinalysis    Component Value Date/Time   COLORURINE YELLOW 09/01/2018 1800   APPEARANCEUR CLEAR 09/01/2018 1800   LABSPEC 1.009 09/01/2018 1800   PHURINE 5.0 09/01/2018 1800   GLUCOSEU NEGATIVE 09/01/2018 1800   HGBUR NEGATIVE 09/01/2018 1800   BILIRUBINUR NEGATIVE 09/01/2018 1800   KETONESUR NEGATIVE 09/01/2018 1800   PROTEINUR NEGATIVE 09/01/2018 1800   NITRITE NEGATIVE 09/01/2018 1800   LEUKOCYTESUR NEGATIVE 09/01/2018 1800   Sepsis Labs Invalid input(s): PROCALCITONIN,  WBC,  LACTICIDVEN Microbiology Recent Results (from the past 240 hour(s))  SARS Coronavirus 2 (CEPHEID - Performed in [facility] hospital lab), Hosp Order     Status: None   Collection Time: 09/01/18  9:07 PM  Result Value Ref Range Status   SARS Coronavirus 2 NEGATIVE NEGATIVE Final    Comment: (NOTE) If result is NEGATIVE SARS-CoV-2 target nucleic acids are NOT DETECTED. The SARS-CoV-2 RNA is generally detectable in upper and lower  respiratory specimens during the acute phase of infection. The lowest  concentration of SARS-CoV-2 viral copies this assay can detect is 250  copies / mL. A negative result does not preclude SARS-CoV-2 infection  and should not be used as the sole basis for treatment or other  patient management decisions.  A negative result may occur with  improper specimen collection / handling, submission of  specimen other  than nasopharyngeal swab, presence of viral mutation(s) within the  areas targeted by this assay, and inadequate number of viral copies  (<250 copies / mL). A negative result must be combined with clinical  observations, patient history, and epidemiological information. If result is POSITIVE SARS-CoV-2 target nucleic acids are DETECTED. The SARS-CoV-2 RNA is generally detectable in upper and lower  respiratory specimens dur ing the acute phase of infection.  Positive  results are indicative of active infection with SARS-CoV-2.  Clinical  correlation with patient history and other diagnostic information is  necessary to determine patient infection status.  Positive results do  not rule out bacterial infection or co-infection with other viruses. If result is PRESUMPTIVE POSTIVE SARS-CoV-2 nucleic acids MAY BE PRESENT.   A presumptive positive result was obtained on the submitted specimen  and confirmed on repeat testing.  While 2019 novel coronavirus  (SARS-CoV-2) nucleic acids may be present in the submitted sample  additional confirmatory testing may be necessary for epidemiological  and / or clinical management purposes  to differentiate between  SARS-CoV-2 and other Sarbecovirus currently known to infect humans.  If clinically indicated additional testing with an alternate test  methodology [phone]) is advised. The SARS-CoV-2 RNA is generally  detectable in upper and lower respiratory sp ecimens during the acute  phase of infection. The expected result is Negative. Fact Sheet for Patients:  StrictlyIdeas.no Fact Sheet for Healthcare Providers: BankingDealers.co.za This test is not yet approved or cleared by the [place] FDA and has been authorized for detection and/or diagnosis of SARS-CoV-2 by FDA under an Emergency Use Authorization (EUA).  This EUA will remain in effect (meaning this test can be used) for the  duration  of the COVID-19 declaration under Section 564(b)(1) of the Act, 21 U.S.C. section 360bbb-3(b)(1), unless the authorization is terminated or revoked sooner. Performed at [facility], [place] [address], [place], [place] [address]   Culture, blood (routine x 2)     Status: None (Preliminary result)   Collection Time: 09/02/18 12:15 AM  Result Value Ref Range Status   Specimen Description BLOOD RIGHT ARM  Final   Special Requests   Final    BOTTLES DRAWN AEROBIC ONLY Blood Culture results may not be optimal due to an excessive volume of blood received in culture bottles   Culture   Final    NO GROWTH 1 DAY Performed at [facility], [place] [address], [place], [place] [address]    Report Status PENDING  Incomplete  Culture, blood (routine x 2)     Status: None (Preliminary result)   Collection Time: 09/02/18 12:25 AM  Result Value Ref Range Status   Specimen Description BLOOD RIGHT HAND  Final   Special Requests   Final    BOTTLES DRAWN AEROBIC ONLY Blood Culture results may not be optimal due to an excessive volume of blood received in culture bottles   Culture   Final    NO GROWTH 1 DAY Performed at [facility], [place] [address] [place],  [address]    Report Status PENDING  Incomplete     Time coordinating discharge: 35 minutes  SIGNED:   Aline August, MD  [facility] 09/03/2018, 2:12 PM

## 2018-09-03 NOTE — TOC Transition Note (Signed)
Transition of Care [facility]) - CM/SW Discharge Note   Patient Details  Name: Jeffrey Campbell MRN: [id number] Date of Birth: [birth year]  [facility] CM/SW Contact:  Carles Collet, RN Phone Number: 09/03/2018, 2:23 PM   Clinical Narrative:    Called patient's wife, spoke w his daughter. Discussed HH options and reviewed Medicare ratings. They would like [facility], referral placed to [facility]. They also requested cane. Order placed and referral made to [facility] for delivery to room prior to DC. No other CM needs identified.     Final next level of care: [place] Barriers to Discharge: No Barriers Identified   Patient Goals and CMS Choice Patient states their goals for this hospitalization and ongoing recovery are:: to return home CMS Medicare.gov Compare Post Acute Care list provided to:: Patient Represenative (must comment) Choice offered to / list presented to : Adult Children  Discharge Placement                       Discharge Plan and Services                DME Arranged: Kasandra Knudsen DME Agency: [employer] Date DME Agency Contacted: 09/03/18 Time DME Agency Contacted: [phone] Representative spoke with at DME Agency: [place]: PT [place]: [place] ([place]) Date [place]: 09/03/18 Time [place]: [place] Representative spoke with at [place]: [place] ([place]) Interventions     Readmission Risk Interventions No flowsheet data found.

## 2018-09-03 NOTE — Progress Notes (Signed)
Patient ID: Jeffrey Campbell, male   DOB: [birth year], 85 y.o.   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  No fever or cough or nausea, vomiting or diarrhea reported.  On presentation, CT head was unremarkable.  Sodium was 146.  Chest x-ray showed stable cardiomegaly.  Urinalysis was unremarkable.  Blood pressure was on the lower side with mild hypothymia.  He was kept under observation for acute encephalopathy and further work-up.  Assessment & Plan:   Principal Problem:   Acute encephalopathy Active Problems:   OSA (obstructive sleep apnea)   Hypothyroidism   Type 2 diabetes, diet controlled (HCC)   COPD (chronic obstructive pulmonary disease) (HCC)   Bradycardia   Pulmonary hypertension (HCC)   Chronic diastolic CHF (congestive heart failure) (HCC)   Vascular dementia with behavioral disturbance (HCC)   CKD (chronic kidney disease), stage V ([place])   Hypothermia  Acute encephalopathy in a patient with vascular dementia Hypothermia -Questionable cause.  MRI of the brain done this morning is negative for acute stroke.  CT of the brain was negative as well on presentation.  Might be secondary to waxing and waning of his dementia and probably worsening dementia. -Continue neurochecks. -Fall precautions. -Currently not hypothermic -Currently on room air -PT/SLP evaluation. -Normal folate and elevated vitamin B12 level.  Slightly low TSH level.  -SARS-CoV-2 testing was negative -chest x-ray showed stable cardiomegaly and  pulmonary congestion without overt edema -Palliative care consult for goals of care  discussion is pending.  Patient is still full code. -This morning, patient was very agitated and had to be given intravenous Haldol.  Monitor mental status.  His symptoms sound more like dementia with worsening symptoms and delirium.  He would be better off discharge home as soon has his mental status is slightly better.  Spoke to daughter [name] on phone  Hypotension History of hypertension -Unclear etiology.  Does not look like he has any evidence of infection.  Empirically started on vancomycin and cefepime.  Vancomycin discontinued on 09/02/2018.  Will DC cefepime as well.  No evidence of any infection.   -Blood pressure currently is on the higher side.  Monitor.  Will resume home antihypertensive regimen.  Chronic bradycardia -Seems to be chronic.  Patient has been evaluated by cardiology in the past.  Outpatient follow-up with cardiology.  Monitor heart rate.  Avoid AV node blocking agent.  Chronic kidney disease stage IV/V with metabolic acidosis -Creatinine at baseline.  Monitor.  Hypothyroidism -TSH slightly low.  Will decrease levothyroxine to 100 mcg daily.  Chronic diastolic CHF/pulmonary hypertension -Echo in 08/30/2017 showed EF of 60 to 65% with grade 2 diastolic dysfunction. -Currently no signs of volume overload.  Strict input and output.  Daily weights. -We will resume Lasix from tomorrow, may be at a lower dose.  Sleep apnea -Does not use CPAP  Diabetes mellitus type 2 -Monitor CBGs.  Thrombocytopenia -Probably chronic.  No signs of bleeding.  Monitor  DVT prophylaxis: Heparin Code Status: Full  family Communication: Spoke with [place]/wife on phone on 09/02/18.  Spoke with Danielle/daughter on phone on 09/03/2018.  Disposition Plan: Home in 1 to 2 days if clinically improves  Consultants: Consulted  palliative care  Procedures: None  Antimicrobials: cefepime from 09/01/2018 onwards Vancomycin 09/01/2018-09/02/2018   Subjective: Patient seen and examined at bedside.   He is currently sleepy, hardly wakes up on calling his name.  He was apparently extremely agitated this morning and required intravenous Haldol.  No overnight fever or vomiting.  Objective: Vitals:   09/03/18 0747 09/03/18 1025 09/03/18 1108 09/03/18 1216  BP: (!) 196/62 (!) 202/69 (!) 203/61   Pulse: (!) 49  (!) 47   Resp: 18  11 18   Temp: 98.1 F (36.7 C)  98.1 F (36.7 C)   TempSrc: Oral  Oral   SpO2: 98%  97%   Weight:      Height:        Intake/Output Summary (Last 24 hours) at 09/03/2018 1241 Last data filed at 09/03/2018 1100 Gross per 24 hour  Intake 440 ml  Output 2100 ml  Net -1660 ml   Filed Weights   09/01/18 1716 09/03/18 0415  Weight: 61.2 kg 57.8 kg    Examination:  General exam: Appears calm and comfortable.  Currently not agitated.  Elderly male.  Sleepy.   Respiratory system: Bilateral decreased breath sounds at bases with some scattered crackles.  No wheezing Cardiovascular system: S1 & S2 heard, still has intermittent bradycardia Gastrointestinal system: Abdomen is nondistended, soft and nontender. Normal bowel sounds heard. Extremities: No cyanosis, edema   Data Reviewed: I have personally reviewed following labs and imaging studies  CBC: Recent Labs  Lab 09/01/18 1800 09/02/18 0015 09/02/18 0358 09/03/18 0430  WBC 4.5 4.7 5.3 5.2  NEUTROABS  --   --  3.8 3.6  HGB 11.0* 10.9* 10.4* 10.5*  HCT 34.9* 34.1* 32.6* 32.5*  MCV 100.3* 99.4 99.1 97.3  PLT 132* 132* 131* 269*   Basic Metabolic Panel: Recent Labs  Lab 09/01/18 1800 09/02/18 0015 09/02/18 0358 09/03/18 0430  NA 146*  --  146* 144  K 4.0  --  3.6 3.7  CL 119*  --  118* 113*  CO2 21*  --  18* 21*  GLUCOSE 140*  --  111* 95  BUN 77*  --  73* 64*  CREATININE 4.02* 4.00* 3.84* 3.37*  CALCIUM 10.2  --  9.9 10.2  MG 2.2  --   --  1.9   GFR: Estimated Creatinine Clearance: 13.6 mL/min (A) (by C-G formula based on SCr of 3.37 mg/dL (H)). Liver Function Tests: Recent Labs  Lab  09/01/18 1800 09/02/18 0358 09/03/18 0430  AST 44* 34 26  ALT 65* 54* 43  ALKPHOS 74 65 61  BILITOT 1.4* 1.1 1.3*  PROT 6.9 6.3* 6.4*  ALBUMIN 3.9 3.7 3.5   No results for input(s): LIPASE, AMYLASE in the last 168 hours. Recent Labs  Lab 09/02/18 0015 09/03/18 0430  AMMONIA 26 26   Coagulation Profile: No results for input(s): INR, PROTIME in the last 168 hours. Cardiac Enzymes: Recent Labs  Lab 09/01/18 1800 09/02/18 0015 09/02/18 0358 09/02/18 1007  TROPONINI 0.07* 0.06* 0.06* 0.06*   BNP (last 3 results) Recent Labs    09/06/17 0850  PROBNP 2,000*   HbA1C: No results for input(s): HGBA1C in the last 72 hours. CBG: Recent Labs  Lab 09/02/18 1238 09/02/18 1606 09/02/18 2221 09/03/18 0744 09/03/18 1104  GLUCAP 133* 135* 140* 89 117*   Lipid Profile: No results for input(s): CHOL, HDL, LDLCALC, TRIG, CHOLHDL, LDLDIRECT in the last 72 hours. Thyroid Function Tests: Recent Labs    09/02/18 0015  TSH 0.183*  Anemia Panel: Recent Labs    09/02/18 0015 09/02/18 0023  VITAMINB12 1,095*  --   FOLATE  --  18.4  FERRITIN 500*  --   TIBC 203*  --   IRON 32*  --   RETICCTPCT 2.0  --    Sepsis Labs: Recent Labs  Lab 09/02/18 0015 09/02/18 0358  LATICACIDVEN 0.7 0.6    Recent Results (from the past 240 hour(s))  SARS Coronavirus 2 (CEPHEID - Performed in [facility] hospital lab), Hosp Order     Status: None   Collection Time: 09/01/18  9:07 PM  Result Value Ref Range Status   SARS Coronavirus 2 NEGATIVE NEGATIVE Final    Comment: (NOTE) If result is NEGATIVE SARS-CoV-2 target nucleic acids are NOT DETECTED. The SARS-CoV-2 RNA is generally detectable in upper and lower  respiratory specimens during the acute phase of infection. The lowest  concentration of SARS-CoV-2 viral copies this assay can detect is 250  copies / mL. A negative result does not preclude SARS-CoV-2 infection  and should not be used as the sole basis for treatment or other    patient management decisions.  A negative result may occur with  improper specimen collection / handling, submission of specimen other  than nasopharyngeal swab, presence of viral mutation(s) within the  areas targeted by this assay, and inadequate number of viral copies  (<250 copies / mL). A negative result must be combined with clinical  observations, patient history, and epidemiological information. If result is POSITIVE SARS-CoV-2 target nucleic acids are DETECTED. The SARS-CoV-2 RNA is generally detectable in upper and lower  respiratory specimens dur ing the acute phase of infection.  Positive  results are indicative of active infection with SARS-CoV-2.  Clinical  correlation with patient history and other diagnostic information is  necessary to determine patient infection status.  Positive results do  not rule out bacterial infection or co-infection with other viruses. If result is PRESUMPTIVE POSTIVE SARS-CoV-2 nucleic acids MAY BE PRESENT.   A presumptive positive result was obtained on the submitted specimen  and confirmed on repeat testing.  While 2019 novel coronavirus  (SARS-CoV-2) nucleic acids may be present in the submitted sample  additional confirmatory testing may be necessary for epidemiological  and / or clinical management purposes  to differentiate between  SARS-CoV-2 and other Sarbecovirus currently known to infect humans.  If clinically indicated additional testing with an alternate test  methodology [phone]) is advised. The SARS-CoV-2 RNA is generally  detectable in upper and lower respiratory sp ecimens during the acute  phase of infection. The expected result is Negative. Fact Sheet for Patients:  StrictlyIdeas.no Fact Sheet for Healthcare Providers: BankingDealers.co.za This test is not yet approved or cleared by the [place] FDA and has been authorized for detection and/or diagnosis of SARS-CoV-2  by FDA under an Emergency Use Authorization (EUA).  This EUA will remain in effect (meaning this test can be used) for the duration of the COVID-19 declaration under Section 564(b)(1) of the Act, 21 U.S.C. section 360bbb-3(b)(1), unless the authorization is terminated or revoked sooner. Performed at [facility], [place] [address] [place], [place] [address]   Culture, blood (routine x 2)     Status: None (Preliminary result)   Collection Time: 09/02/18 12:15 AM  Result Value Ref Range Status   Specimen Description BLOOD RIGHT ARM  Final   Special Requests   Final    BOTTLES DRAWN AEROBIC ONLY Blood Culture results may not be optimal due  to an excessive volume of blood received in culture bottles   Culture   Final    NO GROWTH 1 DAY Performed at [facility], [place] [address], [place], [place] [address]    Report Status PENDING  Incomplete  Culture, blood (routine x 2)     Status: None (Preliminary result)   Collection Time: 09/02/18 12:25 AM  Result Value Ref Range Status   Specimen Description BLOOD RIGHT HAND  Final   Special Requests   Final    BOTTLES DRAWN AEROBIC ONLY Blood Culture results may not be optimal due to an excessive volume of blood received in culture bottles   Culture   Final    NO GROWTH 1 DAY Performed at [facility], [place] [address] [place], [place] [address]    Report Status PENDING  Incomplete         Radiology Studies: Ct Head Wo Contrast  Result Date: 09/01/2018 CLINICAL DATA:  Altered level of consciousness/confusion EXAM: CT HEAD WITHOUT CONTRAST TECHNIQUE: Contiguous axial images were obtained from the base of the skull through the vertex without intravenous contrast. COMPARISON:  May 20, 2018 FINDINGS: Brain: Moderate diffuse atrophy is stable. There is no intracranial mass, hemorrhage, extra-axial fluid collection, or midline shift. There is patchy small vessel disease throughout the centra semiovale bilaterally. No acute  infarct is demonstrable. Vascular: No hyperdense vessel. There is calcification in each distal vertebral artery and in the carotid siphon regions bilaterally. Skull: The bony calvarium appears intact. Sinuses/Orbits: There is mucosal thickening in each maxillary antrum. There is mucosal thickening in several ethmoid air cells. Orbits appear symmetric bilaterally. Other: Apparent small skin nevus over the right frontal-parietal junction region. Mastoid air cells are clear. IMPRESSION: Stable atrophy with periventricular small vessel disease. No acute infarct evident. No mass or hemorrhage. There are foci of arterial vascular calcification. There are foci of paranasal sinus disease at several sites. Electronically Signed   By: Lowella Grip III M.D.   On: 09/01/2018 19:46   Mr Brain Wo Contrast  Result Date: 09/02/2018 CLINICAL DATA:  Unexplained altered level of consciousness. EXAM: MRI HEAD WITHOUT CONTRAST TECHNIQUE: Multiplanar, multiecho pulse sequences of the brain and surrounding structures were obtained without intravenous contrast. COMPARISON:  CT head 09/01/2018. MR head 09/12/2016. FINDINGS: Brain: No acute infarction, hemorrhage, hydrocephalus, extra-axial collection or mass lesion. Generalized atrophy. Extensive T2 and FLAIR hyperintensities throughout the white matter, likely small vessel disease. Vascular: Flow voids are maintained. Skull and upper cervical spine: Normal skull base marrow signal. Mild cervical spondylosis, incompletely evaluated. Sinuses/Orbits: No significant sinus disease. BILATERAL exophthalmos, in this patient with history of hypothyroidism. There is prominence of the intraconal fat which appears more contributory than muscle enlargement. Other: None. IMPRESSION: Atrophy and small vessel disease, similar to priors. No acute intracranial findings. Electronically Signed   By: Staci Righter M.D.   On: 09/02/2018 12:39   Dg Chest Port 1 View  Result Date: 09/01/2018 CLINICAL  DATA:  Weakness. EXAM: PORTABLE CHEST 1 VIEW COMPARISON:  Chest x-ray dated May 20, 2018. FINDINGS: Stable cardiomegaly. Normal mediastinal contours. Persistent pulmonary vascular congestion. No focal consolidation, pleural effusion, or pneumothorax. No acute osseous abnormality. IMPRESSION: Stable cardiomegaly and pulmonary vascular congestion without overt edema. Electronically Signed   By: Titus Dubin M.D.   On: 09/01/2018 17:56        Scheduled Meds:  amLODipine  10 mg Oral Daily   aspirin EC  81 mg Oral Daily   atorvastatin  20 mg Oral Daily   brimonidine  1 drop Both Eyes BID   brinzolamide  1 drop Both Eyes BID   calcitRIOL  0.25 mcg Oral Daily   heparin  5,000 Units Subcutaneous Q8H   hydrALAZINE  50 mg Oral TID   insulin aspart  0-9 Units Subcutaneous TID WC   levothyroxine  150 mcg Oral QAC breakfast   losartan  50 mg Oral Daily   multivitamin with minerals  1 tablet Oral Daily   Continuous Infusions:  sodium chloride     ceFEPime (MAXIPIME) IV Stopped (09/02/18 2238)     LOS: 1 day        Aline August, MD [facility] 09/03/2018, 12:41 PM

## 2018-09-03 NOTE — Progress Notes (Addendum)
This AC spoke with pt's daughter [name], expressed that she is upset that she can't visit with her father.  I reviewed the current policy and attempted to provide explanation when she interrupted me and stated " I don't care about any other patients."  I advised that the hospital must keep every patient and staff member safe and therefore in person visitation is limited and her father does not meet the exception criteria. I offered her virtual visitation and daily updates. I also reassured her that her father was compliant and easily redirectable when he becomes confused at this time (per staff RN.) [name] reluctantly agrees with this plan at this time.  [id number] AC

## 2018-09-03 NOTE — Consult Note (Signed)
Consultation Note Date: 09/03/2018   Patient Name: Jeffrey Campbell  DOB: [birth year]  MRN: [id number]  Age / Sex: 85 y.o., male  PCP: [name], MD Referring Physician: Aline August, MD  Reason for Consultation: Establishing goals of care  HPI/Patient Profile: 85 y.o. male  with past medical history of moderate memory loss, pulmonary hypertension, OSA, stage IV CKD with grafts left upper extremity placed July 2019, CAD, mitral valve prolapse, diastolic dysfunction, carotid artery stenosis, BPH, PUD, anemia, admitted on 09/01/2018 with acute encephalopathy in patient with vascular dementia.  PMT consulted for goals of care.  Clinical Assessment and Goals of Care:  Jeffrey Campbell is sitting in the wheelchair in his room with nursing tech at bedside.  He will briefly make but not keep eye contact.  He repeatedly leans forward to look out the window.  He appears quite frail, chronically ill.  Call to daughter, Genevieve Norlander at [phone].  We talked about the discharge plan, Jeffrey Campbell is to follow-up with PCP for labs in 1 week.  Danielle states that PCP Dr. Drema Dallas is a trusted physician.  We talked about home health services, I share that home health will come and do their task and leave.  We also talked about in-home palliative services.  I share that home health and palliative may be able to provide family with additional support/services as needed.   I asked [name] who was talked about the natural progression of memory loss with family.  She states that she feels they would benefit from discussions related to progression of disease, what is expected.  We talk about Jeffrey Campbell frailty, and although [name] shares that he "eats well" he has lost weight.  I share that there are normal changes in mental state, physical function, and nutrition that occur with memory loss.  [name] asks appropriate questions,  is interested in learning more about how to care for her father and mother.    Family is agreeable to outpatient palliative services.  I encouraged Danielle to be present when palliative comes to visit.  We discussed healthcare power of attorney, see below. We discussed CODE STATUS, see below.  HCPOA   NEXT OF KIN -wife and daughter make choices together. Sister and 2 bothers, live out of state.   SUMMARY OF RECOMMENDATIONS   Continue to treat the treatable Family is agreeable to outpatient palliative services Continue CODE STATUS discussions  Code Status/Advance Care Planning:  Full code -[name] shares that CODE STATUS/life support is not something that family has really discussed.  We talked about the concept of "treat the treatable, but allowing natural passing".  I share my concern that if we are giving medications, antibiotics, drawing labs, checking images, and Jeffrey Campbell still worsens, CPR or life support would not be effective.  Symptom Management:   Per hospitalist, no additional needs at this time.  Palliative Prophylaxis:   Aspiration  Additional Recommendations (Limitations, Scope, Preferences):  Full Scope Treatment  Psycho-social/Spiritual:   Desire for further Chaplaincy support:no  Additional  Recommendations: Caregiving  Support/Resources and Education on Hospice  Prognosis:   < 6 months 6 months or less would not be surprising based on frailty, advancing dementia, weight loss.  Discharge Planning: Home with Home Health      Primary Diagnoses: Present on Admission: . Acute encephalopathy . Vascular dementia with behavioral disturbance ([place]) . Pulmonary hypertension ([place]) . OSA (obstructive sleep apnea) . Hypothyroidism . COPD (chronic obstructive pulmonary disease) ([place]) . CKD (chronic kidney disease), stage V ([place]) . Chronic diastolic CHF (congestive heart failure) ([place]) . Bradycardia . Hypothermia   I have reviewed the medical record,  interviewed the patient and family, and examined the patient. The following aspects are pertinent.  Past Medical History:  Diagnosis Date  . Anemia    low iron  . BPH (benign prostatic hyperplasia)   . CAD (coronary artery disease)   . Carotid artery stenosis    1-39% bilateral carotid artery stenosis and < 50% stenosis in the right CCA by dopplers 06/2017  . CKD (chronic kidney disease), stage III (HCC)    Stage 4  . Diastolic dysfunction   . Glaucoma   . Graves disease   . Heart murmur, systolic   . History of ETOH abuse   . History of PFTs 05/2010   moderate airflow obstruction w reduced DLCO by PFT   . Hyperlipemia   . Hypertension   . Hyperthyroidism 08/26/10   radioactive iodine therapy   . Mitral regurgitation echo 2015   mild  . Multiple thyroid nodules   . MVP (mitral valve prolapse) 11/2012   posterior MVP  . Organic impotence   . OSA (obstructive sleep apnea)    upper airway resistance syndrome with RDI 18/hr - not on CPAP due to insurance not covering  . Pre-diabetes   . PUD (peptic ulcer disease)   . Pulmonary hypertension ([place]) echo 2015   Group 2 with pulmonary venous HTN and Group 3 with OSA  . Shutdown renal    after cardiac cath  . Upper airway resistance syndrome    Social History   Socioeconomic History  . Marital status: Married    Spouse name: Not on file  . Number of children: 4  . Years of education: Not on file  . Highest education level: Not on file  Occupational History  . Occupation: Retired    Fish farm manager: RETIRED    Comment: [employer]  Social Needs  . Financial resource strain: Not on file  . Food insecurity:    Worry: Not on file    Inability: Not on file  . Transportation needs:    Medical: Not on file    Non-medical: Not on file  Tobacco Use  . Smoking status: Former Smoker    Packs/day: 2.00    Years: 40.00    Pack years: 80.00    Types: Cigarettes    Last attempt to quit: 03/29/1984    Years since quitting: 34.4  .  Smokeless tobacco: Never Used  Substance and Sexual Activity  . Alcohol use: No    Comment: quit in 1981  . Drug use: No  . Sexual activity: Not on file  Lifestyle  . Physical activity:    Days per week: Not on file    Minutes per session: Not on file  . Stress: Not on file  Relationships  . Social connections:    Talks on phone: Not on file    Gets together: Not on file    Attends religious service:  Not on file    Active member of club or organization: Not on file    Attends meetings of clubs or organizations: Not on file    Relationship status: Not on file  Other Topics Concern  . Not on file  Social History Narrative  . Not on file   Family History  Problem Relation Age of Onset  . Kidney failure Father   . Hypertension Father   . Colon cancer Mother   . Colon cancer Brother   . Lung disease Brother   . Colon cancer Maternal Uncle    Scheduled Meds: . amLODipine  10 mg Oral Daily  . aspirin EC  81 mg Oral Daily  . atorvastatin  20 mg Oral Daily  . brimonidine  1 drop Both Eyes BID  . brinzolamide  1 drop Both Eyes BID  . calcitRIOL  0.25 mcg Oral Daily  . [START ON 09/04/2018] furosemide  40 mg Oral Daily  . heparin  5,000 Units Subcutaneous Q8H  . hydrALAZINE  50 mg Oral TID  . insulin aspart  0-9 Units Subcutaneous TID WC  . [START ON 09/04/2018] levothyroxine  100 mcg Oral Q0600  . losartan  50 mg Oral Daily  . multivitamin with minerals  1 tablet Oral Daily   Continuous Infusions: . sodium chloride     PRN Meds:.acetaminophen **OR** acetaminophen, haloperidol lactate, hydrALAZINE, ondansetron **OR** ondansetron (ZOFRAN) IV Medications Prior to Admission:  Prior to Admission medications   Medication Sig Start Date End Date Taking? Authorizing Provider  amLODipine (NORVASC) 10 MG tablet Take 10 mg by mouth daily.   Yes [provider]  aspirin EC 81 MG tablet Take 81 mg by mouth daily.   Yes [provider]  atorvastatin (LIPITOR) 20 MG  tablet TAKE 1 TABLET BY MOUTH EVERY DAY Patient taking differently: Take 20 mg by mouth daily.  05/30/18  Yes Turner, Eber Hong, MD  Brinzolamide-Brimonidine [facility]) 1-0.2 % SUSP Place 1 drop into both eyes 2 (two) times daily.    Yes [provider]  calcitRIOL (ROCALTROL) 0.25 MCG capsule Take 0.25 mcg by mouth daily. 02/24/18  Yes [provider]  doxazosin (CARDURA) 4 MG tablet Take 4 mg by mouth daily.   Yes [provider]  furosemide (LASIX) 40 MG tablet Take 80 mg by mouth daily.    Yes [provider]  hydrALAZINE (APRESOLINE) 100 MG tablet Take 1 tablet (100 mg total) by mouth 3 (three) times daily. Please make yearly appt with Dr. Radford Pax for October. 1st attempt Patient taking differently: Take 100 mg by mouth 3 (three) times daily.  06/15/17  Yes Turner, Eber Hong, MD  levothyroxine (SYNTHROID, LEVOTHROID) 150 MCG tablet Take 150 mcg by mouth daily before breakfast.   Yes [provider]  losartan (COZAAR) 50 MG tablet Take 50 mg by mouth daily. 04/26/18  Yes [provider]  Multiple Vitamin (MULTIVITAMIN WITH MINERALS) TABS tablet Take 1 tablet by mouth daily.   Yes [provider]  Epoetin Alfa (PROCRIT IJ) Inject 5,000 Units as directed every 7 (seven) days.    [provider]   Allergies  Allergen Reactions  . Iodinated Diagnostic Agents Other (See Comments)    Right heart cath 07/2016 allegedly "shut down his kidneys" Patient has stage III chronic kidney disease  . Zocor [Simvastatin] Other (See Comments)    Liver problems   . Budesonide-Formoterol Fumarate Other (See Comments)    Dry mouth  . Minoxidil Other (See Comments)  .  Tiotropium Bromide Monohydrate Other (See Comments)    Dry mouth  . Tricor [Fenofibrate]         Review of Systems  Unable to perform ROS: Dementia    Physical Exam Vitals signs and nursing note reviewed.  Constitutional:      General: He is not in acute distress.     Appearance: He is ill-appearing.     Comments: Appears chronically ill and frail, muscle wasting.  Will briefly make but not keep eye contact  HENT:     Head: Atraumatic.     Comments: Some temporal wasting Cardiovascular:     Rate and Rhythm: Normal rate.     Comments: HD graft site left upper arm positive thrill positive bruit Pulmonary:     Effort: Pulmonary effort is normal. No respiratory distress.  Abdominal:     General: Abdomen is flat. There is no distension.  Musculoskeletal:     Comments: Severe muscle wasting  Skin:    General: Skin is warm and dry.  Neurological:     Mental Status: He is alert.     Comments: Known dementia  Psychiatric:     Comments: Calm at this point, frequently leaning forward to look out the window     Vital Signs: BP (!) 203/61 (BP Location: Right Arm)   Pulse (!) 47   Temp 98.1 F (36.7 C) (Oral)   Resp 18   Ht 5\' 9"  (1.753 m)   Wt 57.8 kg   SpO2 97%   BMI 18.82 kg/m  Pain Scale: 0-10   Pain Score: 0-No pain   SpO2: SpO2: 97 % O2 Device:SpO2: 97 % O2 Flow Rate: .   IO: Intake/output summary:   Intake/Output Summary (Last 24 hours) at 09/03/2018 1352 Last data filed at 09/03/2018 1100 Gross per 24 hour  Intake 440 ml  Output 2100 ml  Net -1660 ml    LBM: Last BM Date: 09/01/18 Baseline Weight: Weight: 61.2 kg Most recent weight: Weight: 57.8 kg     Palliative Assessment/Data:   Flowsheet Rows     Most Recent Value  Intake Tab  Referral Department  Hospitalist  Unit at Time of Referral  Other (Comment)  Palliative Care Primary Diagnosis  Neurology  Date Notified  09/02/18  Palliative Care Type  New Palliative care  Reason for referral  Clarify Goals of Care  Date of Admission  09/02/18  Date first seen by Palliative Care  09/03/18  # of days Palliative referral response time  1 Day(s)  # of days IP prior to Palliative referral  0  Clinical Assessment  Palliative Performance Scale Score  40%  Pain Max last 24 hours   Not able to report  Pain Min Last 24 hours  Not able to report  Dyspnea Max Last 24 Hours  Not able to report  Dyspnea Min Last 24 hours  Not able to report  Psychosocial & Spiritual Assessment  Palliative Care Outcomes      Time In: 1305 Time Out: 1355 Time Total: 50 minutes Greater than 50%  of this time was spent counseling and coordinating care related to the above assessment and plan.  Signed by: Drue Novel, NP   Please contact Palliative Medicine Team phone at [phone] for questions and concerns.  For individual provider: See Shea Evans

## 2018-09-05 DIAGNOSIS — E1122 Type 2 diabetes mellitus with diabetic chronic kidney disease: Secondary | ICD-10-CM | POA: Diagnosis not present

## 2018-09-05 DIAGNOSIS — I2510 Atherosclerotic heart disease of native coronary artery without angina pectoris: Secondary | ICD-10-CM | POA: Diagnosis not present

## 2018-09-05 DIAGNOSIS — I130 Hypertensive heart and chronic kidney disease with heart failure and stage 1 through stage 4 chronic kidney disease, or unspecified chronic kidney disease: Secondary | ICD-10-CM | POA: Diagnosis not present

## 2018-09-05 DIAGNOSIS — I5022 Chronic systolic (congestive) heart failure: Secondary | ICD-10-CM | POA: Diagnosis not present

## 2018-09-05 DIAGNOSIS — Z87891 Personal history of nicotine dependence: Secondary | ICD-10-CM | POA: Diagnosis not present

## 2018-09-05 DIAGNOSIS — N185 Chronic kidney disease, stage 5: Secondary | ICD-10-CM | POA: Diagnosis not present

## 2018-09-05 DIAGNOSIS — I272 Pulmonary hypertension, unspecified: Secondary | ICD-10-CM | POA: Diagnosis not present

## 2018-09-05 DIAGNOSIS — G9340 Encephalopathy, unspecified: Secondary | ICD-10-CM | POA: Diagnosis not present

## 2018-09-05 DIAGNOSIS — Z7982 Long term (current) use of aspirin: Secondary | ICD-10-CM | POA: Diagnosis not present

## 2018-09-05 DIAGNOSIS — J449 Chronic obstructive pulmonary disease, unspecified: Secondary | ICD-10-CM | POA: Diagnosis not present

## 2018-09-05 DIAGNOSIS — F0390 Unspecified dementia without behavioral disturbance: Secondary | ICD-10-CM | POA: Diagnosis not present

## 2018-09-07 ENCOUNTER — Ambulatory Visit (HOSPITAL_COMMUNITY)
Admission: RE | Admit: 2018-09-07 | Discharge: 2018-09-07 | Disposition: A | Discharge status: Home / Self Care | Payer: Medicare Other | Source: Ambulatory Visit | Attending: Nephrology | Admitting: Nephrology

## 2018-09-07 ENCOUNTER — Other Ambulatory Visit: Payer: Self-pay

## 2018-09-07 VITALS — BP 152/58 | HR 47 | Temp 98.3°F | Resp 20

## 2018-09-07 DIAGNOSIS — D631 Anemia in chronic kidney disease: Secondary | ICD-10-CM | POA: Diagnosis not present

## 2018-09-07 DIAGNOSIS — N184 Chronic kidney disease, stage 4 (severe): Secondary | ICD-10-CM | POA: Insufficient documentation

## 2018-09-07 DIAGNOSIS — I509 Heart failure, unspecified: Secondary | ICD-10-CM | POA: Diagnosis not present

## 2018-09-07 DIAGNOSIS — I130 Hypertensive heart and chronic kidney disease with heart failure and stage 1 through stage 4 chronic kidney disease, or unspecified chronic kidney disease: Secondary | ICD-10-CM | POA: Diagnosis not present

## 2018-09-07 LAB — FERRITIN: Ferritin: 524 ng/mL — ABNORMAL HIGH (ref 24–336)

## 2018-09-07 LAB — CULTURE, BLOOD (ROUTINE X 2)
Culture: NO GROWTH
Culture: NO GROWTH

## 2018-09-07 LAB — IRON AND TIBC
Iron: 62 ug/dL (ref 45–182)
Saturation Ratios: 28 % (ref 17.9–39.5)
TIBC: 220 ug/dL — ABNORMAL LOW (ref 250–450)
UIBC: 158 ug/dL

## 2018-09-07 LAB — POCT I-STAT 4, (NA,K, GLUC, HGB,HCT)
Glucose, Bld: 132 mg/dL — ABNORMAL HIGH (ref 70–99)
HCT: 32 % — ABNORMAL LOW (ref 39.0–52.0)
Hemoglobin: 10.9 g/dL — ABNORMAL LOW (ref 13.0–17.0)
Potassium: 4.2 mmol/L (ref 3.5–5.1)
Sodium: 143 mmol/L (ref 135–145)

## 2018-09-07 MED ORDER — EPOETIN ALFA 10000 UNIT/ML IJ SOLN
INTRAMUSCULAR | Status: AC
  Filled 2018-09-07: qty 1

## 2018-09-07 MED ORDER — EPOETIN ALFA 10000 UNIT/ML IJ SOLN
10000.0000 [IU] | INTRAMUSCULAR | Status: DC
  Administered 2018-09-07: 10000 [IU] via SUBCUTANEOUS

## 2018-09-14 ENCOUNTER — Encounter: Payer: Self-pay | Admitting: *Deleted

## 2018-09-14 DIAGNOSIS — F0390 Unspecified dementia without behavioral disturbance: Secondary | ICD-10-CM | POA: Diagnosis not present

## 2018-09-14 DIAGNOSIS — I5022 Chronic systolic (congestive) heart failure: Secondary | ICD-10-CM | POA: Diagnosis not present

## 2018-09-14 DIAGNOSIS — E1122 Type 2 diabetes mellitus with diabetic chronic kidney disease: Secondary | ICD-10-CM | POA: Diagnosis not present

## 2018-09-14 DIAGNOSIS — I130 Hypertensive heart and chronic kidney disease with heart failure and stage 1 through stage 4 chronic kidney disease, or unspecified chronic kidney disease: Secondary | ICD-10-CM | POA: Diagnosis not present

## 2018-09-14 DIAGNOSIS — I2510 Atherosclerotic heart disease of native coronary artery without angina pectoris: Secondary | ICD-10-CM | POA: Diagnosis not present

## 2018-09-14 DIAGNOSIS — G9340 Encephalopathy, unspecified: Secondary | ICD-10-CM | POA: Diagnosis not present

## 2018-09-15 DIAGNOSIS — Z7189 Other specified counseling: Secondary | ICD-10-CM | POA: Diagnosis not present

## 2018-09-15 DIAGNOSIS — D509 Iron deficiency anemia, unspecified: Secondary | ICD-10-CM | POA: Diagnosis not present

## 2018-09-15 DIAGNOSIS — N184 Chronic kidney disease, stage 4 (severe): Secondary | ICD-10-CM | POA: Diagnosis not present

## 2018-09-15 DIAGNOSIS — F0150 Vascular dementia without behavioral disturbance: Secondary | ICD-10-CM | POA: Diagnosis not present

## 2018-09-18 ENCOUNTER — Other Ambulatory Visit: Payer: Self-pay

## 2018-09-18 ENCOUNTER — Encounter: Payer: Self-pay | Admitting: Neurology

## 2018-09-18 ENCOUNTER — Ambulatory Visit (INDEPENDENT_AMBULATORY_CARE_PROVIDER_SITE_OTHER): Payer: Medicare Other | Admitting: Neurology

## 2018-09-18 VITALS — BP 155/58 | HR 52 | Temp 97.8°F | Ht 69.0 in | Wt 143.5 lb

## 2018-09-18 DIAGNOSIS — F0390 Unspecified dementia without behavioral disturbance: Secondary | ICD-10-CM | POA: Insufficient documentation

## 2018-09-18 MED ORDER — DONEPEZIL HCL 10 MG PO TABS: 10.0000 mg | ORAL_TABLET | Freq: Every day | ORAL | 11 refills | Status: DC

## 2018-09-18 MED ORDER — MEMANTINE HCL 10 MG PO TABS: 10.0000 mg | ORAL_TABLET | Freq: Two times a day (BID) | ORAL | 11 refills | Status: DC

## 2018-09-18 NOTE — Progress Notes (Signed)
PATIENT: Jeffrey Campbell DOB: [birth year]  Chief Complaint  Patient presents with  . Memory Loss    MMSE 17/30 - 3 animals.  He is here with his son, Jeffrey Campbell, to have his declining memory further evaluated.  Marland Kitchen PCP    [name], MD - referring MD     HISTORICAL  Jeffrey Campbell is a 85 year old male, seen in request by his primary care physician Dr. Dr. [name] for evaluation of memory loss, he is accompanied by his son Jeffrey Campbell today's clinical visit.  I have reviewed and summarized the referring note from the referring physician.  He has past medical history of hypertension, hyperlipidemia, coronary artery disease, chronic kidney disease, prediabetes, previous alcohol use, presented with gradual onset memory loss has few years  He retired from [employer], he was noted to have gradual onset memory loss over the past couple years, he got lost while driving at the beginning of the year, he still reads newspaper at home, significant gait abnormality, today's Mini-Mental Status Examination 17 out of 30  I personally reviewed September 02, 2018: mild generalized atrophy, small vessel disease, no acute abnormalities.  Laboratory evaluations on September 07, 2018, ferritin was elevated 524, ammonia 26, CMP showed creatinine of 3.37, GFR of 16, CBC showed hemoglobin of 10.5, platelet of 126, vitamin B12 level was mildly elevated at 1095  REVIEW OF SYSTEMS: Full 14 system review of systems performed and notable only for as above All other review of systems were negative.  ALLERGIES: Allergies  Allergen Reactions  . Iodinated Diagnostic Agents Other (See Comments)    Right heart cath 07/2016 allegedly "shut down his kidneys" Patient has stage III chronic kidney disease  . Zocor [Simvastatin] Other (See Comments)    Liver problems   . Budesonide-Formoterol Fumarate Other (See Comments)    Dry mouth  . Minoxidil Other (See Comments)  . Tiotropium Bromide Monohydrate Other (See  Comments)    Dry mouth  . Tricor [Fenofibrate]          HOME MEDICATIONS: Current Outpatient Medications  Medication Sig Dispense Refill  . amLODipine (NORVASC) 10 MG tablet Take 10 mg by mouth daily.    Marland Kitchen atorvastatin (LIPITOR) 20 MG tablet TAKE 1 TABLET BY MOUTH EVERY DAY (Patient taking differently: Take 20 mg by mouth daily. ) 90 tablet 2  . Brinzolamide-Brimonidine (SIMBRINZA) 1-0.2 % SUSP Place 1 drop into both eyes 2 (two) times daily.     . calcitRIOL (ROCALTROL) 0.25 MCG capsule Take 0.25 mcg by mouth daily.    Marland Kitchen doxazosin (CARDURA) 4 MG tablet Take 4 mg by mouth daily.    Marland Kitchen Epoetin Alfa (PROCRIT IJ) Inject 5,000 Units as directed every 7 (seven) days.    . furosemide (LASIX) 40 MG tablet Take 1 tablet (40 mg total) by mouth daily. 30 tablet 0  . hydrALAZINE (APRESOLINE) 100 MG tablet Take 1 tablet (100 mg total) by mouth 3 (three) times daily. Please make yearly appt with Dr. Radford Pax for October. 1st attempt (Patient taking differently: Take 100 mg by mouth 3 (three) times daily. ) 90 tablet 1  . levothyroxine (SYNTHROID, LEVOTHROID) 150 MCG tablet Take 150 mcg by mouth daily before breakfast.    . losartan (COZAAR) 50 MG tablet Take 50 mg by mouth daily.    . QUEtiapine (SEROQUEL) 25 MG tablet Take 0.5 tablets (12.5 mg total) by mouth at bedtime. 15 tablet 0   No current facility-administered medications for this visit.  PAST MEDICAL HISTORY: Past Medical History:  Diagnosis Date  . Anemia    low iron  . BPH (benign prostatic hyperplasia)   . CAD (coronary artery disease)   . Carotid artery stenosis    1-39% bilateral carotid artery stenosis and < 50% stenosis in the right CCA by dopplers 06/2017  . CKD (chronic kidney disease), stage III (HCC)    Stage 4  . Diastolic dysfunction   . Glaucoma   . Graves disease   . Heart murmur, systolic   . History of ETOH abuse   . History of PFTs 05/2010   moderate airflow obstruction w reduced DLCO by PFT   . Hyperlipemia    . Hypertension   . Hyperthyroidism 08/26/10   radioactive iodine therapy   . Memory loss   . Mitral regurgitation echo 2015   mild  . Multiple thyroid nodules   . MVP (mitral valve prolapse) 11/2012   posterior MVP  . Organic impotence   . OSA (obstructive sleep apnea)    upper airway resistance syndrome with RDI 18/hr - not on CPAP due to insurance not covering  . Pre-diabetes   . PUD (peptic ulcer disease)   . Pulmonary hypertension ([place]) echo 2015   Group 2 with pulmonary venous HTN and Group 3 with OSA  . Shutdown renal    after cardiac cath  . Upper airway resistance syndrome     PAST SURGICAL HISTORY: Past Surgical History:  Procedure Laterality Date  . APPENDECTOMY    . AV FISTULA PLACEMENT Left 10/18/2017   Procedure: ARTERIOVENOUS (AV) FISTULA CREATION ARM;  Surgeon: Waynetta Sandy, MD;  Location: [facility];  Service: Vascular;  Laterality: Left;  . CARDIAC CATHETERIZATION    . CARDIAC CATHETERIZATION N/A 02/17/2015   Procedure: Right Heart Cath;  Surgeon: Larey Dresser, MD;  Location: [facility];  Service: Cardiovascular;  Laterality: N/A;  . ESOPHAGOGASTRODUODENOSCOPY (EGD) WITH PROPOFOL Left 10/16/2016   Procedure: ESOPHAGOGASTRODUODENOSCOPY (EGD) WITH PROPOFOL;  Surgeon: Ronnette Juniper, MD;  Location: [facility];  Service: Gastroenterology;  Laterality: Left;  . heart catherization    . HERNIA REPAIR  10/2009  . IR RADIOLOGIST EVAL & MGMT  09/28/2016  . IR RADIOLOGIST EVAL & MGMT  10/19/2016  . IR RADIOLOGIST EVAL & MGMT  01/18/2017  . RIGHT HEART CATH N/A 07/28/2016   Procedure: Right Heart Cath;  Surgeon: Larey Dresser, MD;  Location: [facility];  Service: Cardiovascular;  Laterality: N/A;    FAMILY HISTORY: Family History  Problem Relation Age of Onset  . Kidney failure Father   . Hypertension Father   . Colon cancer Mother   . Colon cancer Brother   . Lung disease Brother   . Colon cancer Maternal Uncle     SOCIAL HISTORY: Social  History   Socioeconomic History  . Marital status: Married    Spouse name: Not on file  . Number of children: 4  . Years of education: completed high school  . Highest education level: Not on file  Occupational History  . Occupation: Retired    Fish farm manager: RETIRED    Comment: [employer]  Social Needs  . Financial resource strain: Not on file  . Food insecurity    Worry: Not on file    Inability: Not on file  . Transportation needs    Medical: Not on file    Non-medical: Not on file  Tobacco Use  . Smoking status: Former Smoker  Packs/day: 2.00    Years: 40.00    Pack years: 80.00    Types: Cigarettes    Quit date: 03/29/1984    Years since quitting: 34.4  . Smokeless tobacco: Never Used  Substance and Sexual Activity  . Alcohol use: No    Comment: quit in 1981  . Drug use: No  . Sexual activity: Not on file  Lifestyle  . Physical activity    Days per week: Not on file    Minutes per session: Not on file  . Stress: Not on file  Relationships  . Social Herbalist on phone: Not on file    Gets together: Not on file    Attends religious service: Not on file    Active member of club or organization: Not on file    Attends meetings of clubs or organizations: Not on file    Relationship status: Not on file  . Intimate partner violence    Fear of current or ex partner: Not on file    Emotionally abused: Not on file    Physically abused: Not on file    Forced sexual activity: Not on file  Other Topics Concern  . Not on file  Social History Narrative   Lives at home with his wife.   Right-handed.   Occasional use of caffeine.     PHYSICAL EXAM   Vitals:   09/18/18 0945  BP: (!) 155/58  Pulse: (!) 52  Temp: 97.8 F (36.6 C)  Weight: 143 lb 8 oz (65.1 kg)  Height: 5\' 9"  (1.753 m)    Not recorded      Body mass index is 21.19 kg/m.  PHYSICAL EXAMNIATION:  Gen: NAD, conversant, well nourised, obese, well groomed                      Cardiovascular: Regular rate rhythm, no peripheral edema, warm, nontender. Eyes: Conjunctivae clear without exudates or hemorrhage Neck: Supple, no carotid bruits. Pulmonary: Clear to auscultation bilaterally   NEUROLOGICAL EXAM:  MMSE - Mini Mental State Exam 09/18/2018  Orientation to time 1  Orientation to Place 5  Registration 3  Attention/ Calculation 0  Recall 0  Language- name 2 objects 2  Language- repeat 1  Language- follow 3 step command 3  Language- read & follow direction 1  Write a sentence 1  Copy design 0  Total score 17     CRANIAL NERVES: CN II: Visual fields are full to confrontation. Pupils are round equal and briskly reactive to light. CN III, IV, VI: extraocular movement are normal. No ptosis. CN V: Facial sensation is intact to pinprick in all 3 divisions bilaterally. Corneal responses are intact.  CN VII: Face is symmetric with normal eye closure and smile. CN VIII: Hearing is normal to rubbing fingers CN IX, X: Palate elevates symmetrically. Phonation is normal. CN XI: Head turning and shoulder shrug are intact CN XII: Tongue is midline with normal movements and no atrophy.  MOTOR: There is no pronator drift of out-stretched arms. Muscle bulk and tone are normal. Muscle strength is normal.  REFLEXES: Reflexes are 2+ and symmetric at the biceps, triceps, knees, and ankles. Plantar responses are flexor.  SENSORY: Intact to light touch, pinprick, positional sensation and vibratory sensation are intact in fingers and toes.  COORDINATION: Rapid alternating movements and fine finger movements are intact. There is no dysmetria on finger-to-nose and heel-knee-shin.    GAIT/STANCE: He needs pushed up to  get up from seated position, leaning forward, mildly unsteady   DIAGNOSTIC DATA (LABS, IMAGING, TESTING) - I reviewed patient records, labs, notes, testing and imaging myself where available.   ASSESSMENT AND PLAN  Jeffrey Campbell is a 85 y.o. male    Dementia  Brain showed generalized atrophy, periventricular small vessel disease  Laboratory evaluation showed no treatable etiology  Encouraged him to keep up with moderate exercise  Namenda 10 mg twice a day  Aricept 10 mg daily   Marcial Pacas, M.D. Ph.D.  [facility] [address], [place], [place] [address] Ph: [phone] Fax: [phone]  CC: Referring Provider

## 2018-09-19 DIAGNOSIS — F0390 Unspecified dementia without behavioral disturbance: Secondary | ICD-10-CM | POA: Diagnosis not present

## 2018-09-19 DIAGNOSIS — G9340 Encephalopathy, unspecified: Secondary | ICD-10-CM | POA: Diagnosis not present

## 2018-09-19 DIAGNOSIS — I2510 Atherosclerotic heart disease of native coronary artery without angina pectoris: Secondary | ICD-10-CM | POA: Diagnosis not present

## 2018-09-19 DIAGNOSIS — E1122 Type 2 diabetes mellitus with diabetic chronic kidney disease: Secondary | ICD-10-CM | POA: Diagnosis not present

## 2018-09-19 DIAGNOSIS — I130 Hypertensive heart and chronic kidney disease with heart failure and stage 1 through stage 4 chronic kidney disease, or unspecified chronic kidney disease: Secondary | ICD-10-CM | POA: Diagnosis not present

## 2018-09-19 DIAGNOSIS — I5022 Chronic systolic (congestive) heart failure: Secondary | ICD-10-CM | POA: Diagnosis not present

## 2018-09-21 ENCOUNTER — Ambulatory Visit (HOSPITAL_COMMUNITY)
Admission: RE | Admit: 2018-09-21 | Discharge: 2018-09-21 | Disposition: A | Discharge status: Home / Self Care | Payer: Medicare Other | Source: Ambulatory Visit | Attending: Nephrology | Admitting: Nephrology

## 2018-09-21 ENCOUNTER — Other Ambulatory Visit: Payer: Self-pay

## 2018-09-21 VITALS — BP 178/43 | HR 43 | Temp 97.6°F | Resp 20

## 2018-09-21 DIAGNOSIS — I130 Hypertensive heart and chronic kidney disease with heart failure and stage 1 through stage 4 chronic kidney disease, or unspecified chronic kidney disease: Secondary | ICD-10-CM | POA: Diagnosis not present

## 2018-09-21 LAB — POCT HEMOGLOBIN-HEMACUE: Hemoglobin: 10.7 g/dL — ABNORMAL LOW (ref 13.0–17.0)

## 2018-09-21 MED ORDER — EPOETIN ALFA 10000 UNIT/ML IJ SOLN
INTRAMUSCULAR | Status: AC
  Administered 2018-09-21: 10000 [IU] via SUBCUTANEOUS
  Filled 2018-09-21: qty 1

## 2018-09-21 MED ORDER — EPOETIN ALFA 10000 UNIT/ML IJ SOLN: 10000.0000 [IU] | INTRAMUSCULAR | Status: DC

## 2018-09-25 DIAGNOSIS — G9340 Encephalopathy, unspecified: Secondary | ICD-10-CM | POA: Diagnosis not present

## 2018-09-25 DIAGNOSIS — I5022 Chronic systolic (congestive) heart failure: Secondary | ICD-10-CM | POA: Diagnosis not present

## 2018-09-25 DIAGNOSIS — E1122 Type 2 diabetes mellitus with diabetic chronic kidney disease: Secondary | ICD-10-CM | POA: Diagnosis not present

## 2018-09-25 DIAGNOSIS — F0390 Unspecified dementia without behavioral disturbance: Secondary | ICD-10-CM | POA: Diagnosis not present

## 2018-09-25 DIAGNOSIS — I130 Hypertensive heart and chronic kidney disease with heart failure and stage 1 through stage 4 chronic kidney disease, or unspecified chronic kidney disease: Secondary | ICD-10-CM | POA: Diagnosis not present

## 2018-09-25 DIAGNOSIS — I2510 Atherosclerotic heart disease of native coronary artery without angina pectoris: Secondary | ICD-10-CM | POA: Diagnosis not present

## 2018-09-26 DIAGNOSIS — E890 Postprocedural hypothyroidism: Secondary | ICD-10-CM | POA: Diagnosis not present

## 2018-09-26 DIAGNOSIS — E0500 Thyrotoxicosis with diffuse goiter without thyrotoxic crisis or storm: Secondary | ICD-10-CM | POA: Diagnosis not present

## 2018-09-26 DIAGNOSIS — R001 Bradycardia, unspecified: Secondary | ICD-10-CM | POA: Diagnosis not present

## 2018-09-27 DIAGNOSIS — E890 Postprocedural hypothyroidism: Secondary | ICD-10-CM | POA: Diagnosis not present

## 2018-09-28 DIAGNOSIS — I2510 Atherosclerotic heart disease of native coronary artery without angina pectoris: Secondary | ICD-10-CM | POA: Diagnosis not present

## 2018-09-28 DIAGNOSIS — I5022 Chronic systolic (congestive) heart failure: Secondary | ICD-10-CM | POA: Diagnosis not present

## 2018-09-28 DIAGNOSIS — E1122 Type 2 diabetes mellitus with diabetic chronic kidney disease: Secondary | ICD-10-CM | POA: Diagnosis not present

## 2018-09-28 DIAGNOSIS — I130 Hypertensive heart and chronic kidney disease with heart failure and stage 1 through stage 4 chronic kidney disease, or unspecified chronic kidney disease: Secondary | ICD-10-CM | POA: Diagnosis not present

## 2018-09-28 DIAGNOSIS — G9340 Encephalopathy, unspecified: Secondary | ICD-10-CM | POA: Diagnosis not present

## 2018-09-28 DIAGNOSIS — F0390 Unspecified dementia without behavioral disturbance: Secondary | ICD-10-CM | POA: Diagnosis not present

## 2018-10-02 DIAGNOSIS — G9340 Encephalopathy, unspecified: Secondary | ICD-10-CM | POA: Diagnosis not present

## 2018-10-02 DIAGNOSIS — I130 Hypertensive heart and chronic kidney disease with heart failure and stage 1 through stage 4 chronic kidney disease, or unspecified chronic kidney disease: Secondary | ICD-10-CM | POA: Diagnosis not present

## 2018-10-02 DIAGNOSIS — I5022 Chronic systolic (congestive) heart failure: Secondary | ICD-10-CM | POA: Diagnosis not present

## 2018-10-02 DIAGNOSIS — H401113 Primary open-angle glaucoma, right eye, severe stage: Secondary | ICD-10-CM | POA: Diagnosis not present

## 2018-10-02 DIAGNOSIS — I2510 Atherosclerotic heart disease of native coronary artery without angina pectoris: Secondary | ICD-10-CM | POA: Diagnosis not present

## 2018-10-02 DIAGNOSIS — F0390 Unspecified dementia without behavioral disturbance: Secondary | ICD-10-CM | POA: Diagnosis not present

## 2018-10-02 DIAGNOSIS — E1122 Type 2 diabetes mellitus with diabetic chronic kidney disease: Secondary | ICD-10-CM | POA: Diagnosis not present

## 2018-10-02 DIAGNOSIS — H401121 Primary open-angle glaucoma, left eye, mild stage: Secondary | ICD-10-CM | POA: Diagnosis not present

## 2018-10-04 DIAGNOSIS — N184 Chronic kidney disease, stage 4 (severe): Secondary | ICD-10-CM | POA: Diagnosis not present

## 2018-10-04 DIAGNOSIS — N2581 Secondary hyperparathyroidism of renal origin: Secondary | ICD-10-CM | POA: Diagnosis not present

## 2018-10-04 DIAGNOSIS — Z8719 Personal history of other diseases of the digestive system: Secondary | ICD-10-CM | POA: Diagnosis not present

## 2018-10-04 DIAGNOSIS — I272 Pulmonary hypertension, unspecified: Secondary | ICD-10-CM | POA: Diagnosis not present

## 2018-10-04 DIAGNOSIS — D631 Anemia in chronic kidney disease: Secondary | ICD-10-CM | POA: Diagnosis not present

## 2018-10-04 DIAGNOSIS — J449 Chronic obstructive pulmonary disease, unspecified: Secondary | ICD-10-CM | POA: Diagnosis not present

## 2018-10-04 DIAGNOSIS — I129 Hypertensive chronic kidney disease with stage 1 through stage 4 chronic kidney disease, or unspecified chronic kidney disease: Secondary | ICD-10-CM | POA: Diagnosis not present

## 2018-10-04 DIAGNOSIS — K219 Gastro-esophageal reflux disease without esophagitis: Secondary | ICD-10-CM | POA: Diagnosis not present

## 2018-10-04 DIAGNOSIS — I5030 Unspecified diastolic (congestive) heart failure: Secondary | ICD-10-CM | POA: Diagnosis not present

## 2018-10-04 DIAGNOSIS — I2510 Atherosclerotic heart disease of native coronary artery without angina pectoris: Secondary | ICD-10-CM | POA: Diagnosis not present

## 2018-10-04 DIAGNOSIS — Z9889 Other specified postprocedural states: Secondary | ICD-10-CM | POA: Diagnosis not present

## 2018-10-04 DIAGNOSIS — N400 Enlarged prostate without lower urinary tract symptoms: Secondary | ICD-10-CM | POA: Diagnosis not present

## 2018-10-05 ENCOUNTER — Other Ambulatory Visit: Payer: Self-pay

## 2018-10-05 ENCOUNTER — Encounter (HOSPITAL_COMMUNITY)
Admission: RE | Admit: 2018-10-05 | Discharge: 2018-10-05 | Disposition: A | Discharge status: Home / Self Care | Payer: Medicare Other | Source: Ambulatory Visit | Attending: Nephrology | Admitting: Nephrology

## 2018-10-05 VITALS — BP 80/63 | HR 47 | Temp 98.2°F | Resp 20

## 2018-10-05 DIAGNOSIS — D631 Anemia in chronic kidney disease: Secondary | ICD-10-CM | POA: Insufficient documentation

## 2018-10-05 DIAGNOSIS — G9340 Encephalopathy, unspecified: Secondary | ICD-10-CM | POA: Diagnosis not present

## 2018-10-05 DIAGNOSIS — E1122 Type 2 diabetes mellitus with diabetic chronic kidney disease: Secondary | ICD-10-CM | POA: Diagnosis not present

## 2018-10-05 DIAGNOSIS — I130 Hypertensive heart and chronic kidney disease with heart failure and stage 1 through stage 4 chronic kidney disease, or unspecified chronic kidney disease: Secondary | ICD-10-CM | POA: Diagnosis not present

## 2018-10-05 DIAGNOSIS — Z87891 Personal history of nicotine dependence: Secondary | ICD-10-CM | POA: Diagnosis not present

## 2018-10-05 DIAGNOSIS — N185 Chronic kidney disease, stage 5: Secondary | ICD-10-CM | POA: Diagnosis not present

## 2018-10-05 DIAGNOSIS — Z7982 Long term (current) use of aspirin: Secondary | ICD-10-CM | POA: Diagnosis not present

## 2018-10-05 DIAGNOSIS — I2510 Atherosclerotic heart disease of native coronary artery without angina pectoris: Secondary | ICD-10-CM | POA: Diagnosis not present

## 2018-10-05 DIAGNOSIS — N184 Chronic kidney disease, stage 4 (severe): Secondary | ICD-10-CM | POA: Diagnosis not present

## 2018-10-05 DIAGNOSIS — F0390 Unspecified dementia without behavioral disturbance: Secondary | ICD-10-CM | POA: Diagnosis not present

## 2018-10-05 DIAGNOSIS — I5022 Chronic systolic (congestive) heart failure: Secondary | ICD-10-CM | POA: Diagnosis not present

## 2018-10-05 DIAGNOSIS — J449 Chronic obstructive pulmonary disease, unspecified: Secondary | ICD-10-CM | POA: Diagnosis not present

## 2018-10-05 DIAGNOSIS — I272 Pulmonary hypertension, unspecified: Secondary | ICD-10-CM | POA: Diagnosis not present

## 2018-10-05 LAB — IRON AND TIBC
Iron: 45 ug/dL (ref 45–182)
Saturation Ratios: 19 % (ref 17.9–39.5)
TIBC: 232 ug/dL — ABNORMAL LOW (ref 250–450)
UIBC: 187 ug/dL

## 2018-10-05 LAB — POCT HEMOGLOBIN-HEMACUE: Hemoglobin: 10.3 g/dL — ABNORMAL LOW (ref 13.0–17.0)

## 2018-10-05 LAB — FERRITIN: Ferritin: 403 ng/mL — ABNORMAL HIGH (ref 24–336)

## 2018-10-05 MED ORDER — EPOETIN ALFA 10000 UNIT/ML IJ SOLN
10000.0000 [IU] | INTRAMUSCULAR | Status: DC
  Administered 2018-10-05: 10000 [IU] via SUBCUTANEOUS

## 2018-10-05 MED ORDER — EPOETIN ALFA 10000 UNIT/ML IJ SOLN
INTRAMUSCULAR | Status: AC
  Filled 2018-10-05: qty 1

## 2018-10-06 DIAGNOSIS — G9340 Encephalopathy, unspecified: Secondary | ICD-10-CM | POA: Diagnosis not present

## 2018-10-06 DIAGNOSIS — F0390 Unspecified dementia without behavioral disturbance: Secondary | ICD-10-CM | POA: Diagnosis not present

## 2018-10-06 DIAGNOSIS — I130 Hypertensive heart and chronic kidney disease with heart failure and stage 1 through stage 4 chronic kidney disease, or unspecified chronic kidney disease: Secondary | ICD-10-CM | POA: Diagnosis not present

## 2018-10-06 DIAGNOSIS — E1122 Type 2 diabetes mellitus with diabetic chronic kidney disease: Secondary | ICD-10-CM | POA: Diagnosis not present

## 2018-10-06 DIAGNOSIS — I2510 Atherosclerotic heart disease of native coronary artery without angina pectoris: Secondary | ICD-10-CM | POA: Diagnosis not present

## 2018-10-06 DIAGNOSIS — I5022 Chronic systolic (congestive) heart failure: Secondary | ICD-10-CM | POA: Diagnosis not present

## 2018-10-10 ENCOUNTER — Other Ambulatory Visit: Payer: Self-pay | Admitting: Neurology

## 2018-10-10 DIAGNOSIS — Z961 Presence of intraocular lens: Secondary | ICD-10-CM | POA: Diagnosis not present

## 2018-10-10 DIAGNOSIS — H401113 Primary open-angle glaucoma, right eye, severe stage: Secondary | ICD-10-CM | POA: Diagnosis not present

## 2018-10-10 DIAGNOSIS — H401121 Primary open-angle glaucoma, left eye, mild stage: Secondary | ICD-10-CM | POA: Diagnosis not present

## 2018-10-13 DIAGNOSIS — G9340 Encephalopathy, unspecified: Secondary | ICD-10-CM | POA: Diagnosis not present

## 2018-10-13 DIAGNOSIS — F0390 Unspecified dementia without behavioral disturbance: Secondary | ICD-10-CM | POA: Diagnosis not present

## 2018-10-13 DIAGNOSIS — I5022 Chronic systolic (congestive) heart failure: Secondary | ICD-10-CM | POA: Diagnosis not present

## 2018-10-13 DIAGNOSIS — I130 Hypertensive heart and chronic kidney disease with heart failure and stage 1 through stage 4 chronic kidney disease, or unspecified chronic kidney disease: Secondary | ICD-10-CM | POA: Diagnosis not present

## 2018-10-13 DIAGNOSIS — I2510 Atherosclerotic heart disease of native coronary artery without angina pectoris: Secondary | ICD-10-CM | POA: Diagnosis not present

## 2018-10-13 DIAGNOSIS — E1122 Type 2 diabetes mellitus with diabetic chronic kidney disease: Secondary | ICD-10-CM | POA: Diagnosis not present

## 2018-10-16 DIAGNOSIS — R21 Rash and other nonspecific skin eruption: Secondary | ICD-10-CM | POA: Diagnosis not present

## 2018-10-17 DIAGNOSIS — I130 Hypertensive heart and chronic kidney disease with heart failure and stage 1 through stage 4 chronic kidney disease, or unspecified chronic kidney disease: Secondary | ICD-10-CM | POA: Diagnosis not present

## 2018-10-17 DIAGNOSIS — E1122 Type 2 diabetes mellitus with diabetic chronic kidney disease: Secondary | ICD-10-CM | POA: Diagnosis not present

## 2018-10-17 DIAGNOSIS — I2510 Atherosclerotic heart disease of native coronary artery without angina pectoris: Secondary | ICD-10-CM | POA: Diagnosis not present

## 2018-10-17 DIAGNOSIS — I5022 Chronic systolic (congestive) heart failure: Secondary | ICD-10-CM | POA: Diagnosis not present

## 2018-10-17 DIAGNOSIS — G9340 Encephalopathy, unspecified: Secondary | ICD-10-CM | POA: Diagnosis not present

## 2018-10-17 DIAGNOSIS — F0390 Unspecified dementia without behavioral disturbance: Secondary | ICD-10-CM | POA: Diagnosis not present

## 2018-10-19 ENCOUNTER — Other Ambulatory Visit: Payer: Self-pay

## 2018-10-19 ENCOUNTER — Ambulatory Visit (HOSPITAL_COMMUNITY)
Admission: RE | Admit: 2018-10-19 | Discharge: 2018-10-19 | Disposition: A | Discharge status: Home / Self Care | Payer: Medicare Other | Source: Ambulatory Visit | Attending: Nephrology | Admitting: Nephrology

## 2018-10-19 VITALS — BP 172/57 | HR 41 | Temp 97.3°F | Resp 20

## 2018-10-19 DIAGNOSIS — I130 Hypertensive heart and chronic kidney disease with heart failure and stage 1 through stage 4 chronic kidney disease, or unspecified chronic kidney disease: Secondary | ICD-10-CM | POA: Diagnosis not present

## 2018-10-19 LAB — POCT HEMOGLOBIN-HEMACUE: Hemoglobin: 9.7 g/dL — ABNORMAL LOW (ref 13.0–17.0)

## 2018-10-19 MED ORDER — EPOETIN ALFA 10000 UNIT/ML IJ SOLN
INTRAMUSCULAR | Status: AC
  Administered 2018-10-19: 10000 [IU] via SUBCUTANEOUS
  Filled 2018-10-19: qty 1

## 2018-10-19 MED ORDER — EPOETIN ALFA 10000 UNIT/ML IJ SOLN
10000.0000 [IU] | INTRAMUSCULAR | Status: DC
  Administered 2018-10-19: 10000 [IU] via SUBCUTANEOUS

## 2018-10-25 DIAGNOSIS — I2510 Atherosclerotic heart disease of native coronary artery without angina pectoris: Secondary | ICD-10-CM | POA: Diagnosis not present

## 2018-10-25 DIAGNOSIS — E1122 Type 2 diabetes mellitus with diabetic chronic kidney disease: Secondary | ICD-10-CM | POA: Diagnosis not present

## 2018-10-25 DIAGNOSIS — G9340 Encephalopathy, unspecified: Secondary | ICD-10-CM | POA: Diagnosis not present

## 2018-10-25 DIAGNOSIS — I130 Hypertensive heart and chronic kidney disease with heart failure and stage 1 through stage 4 chronic kidney disease, or unspecified chronic kidney disease: Secondary | ICD-10-CM | POA: Diagnosis not present

## 2018-10-25 DIAGNOSIS — I5022 Chronic systolic (congestive) heart failure: Secondary | ICD-10-CM | POA: Diagnosis not present

## 2018-10-25 DIAGNOSIS — F0390 Unspecified dementia without behavioral disturbance: Secondary | ICD-10-CM | POA: Diagnosis not present

## 2018-11-01 ENCOUNTER — Other Ambulatory Visit: Payer: Self-pay

## 2018-11-02 ENCOUNTER — Ambulatory Visit (HOSPITAL_COMMUNITY)
Admission: RE | Admit: 2018-11-02 | Discharge: 2018-11-02 | Disposition: A | Discharge status: Home / Self Care | Payer: Medicare Other | Source: Ambulatory Visit | Attending: Nephrology | Admitting: Nephrology

## 2018-11-02 VITALS — BP 180/55 | HR 46 | Temp 97.4°F | Resp 20

## 2018-11-02 DIAGNOSIS — I130 Hypertensive heart and chronic kidney disease with heart failure and stage 1 through stage 4 chronic kidney disease, or unspecified chronic kidney disease: Secondary | ICD-10-CM | POA: Diagnosis not present

## 2018-11-02 DIAGNOSIS — N184 Chronic kidney disease, stage 4 (severe): Secondary | ICD-10-CM | POA: Insufficient documentation

## 2018-11-02 DIAGNOSIS — I509 Heart failure, unspecified: Secondary | ICD-10-CM | POA: Diagnosis not present

## 2018-11-02 DIAGNOSIS — D631 Anemia in chronic kidney disease: Secondary | ICD-10-CM | POA: Insufficient documentation

## 2018-11-02 LAB — IRON AND TIBC
Iron: 47 ug/dL (ref 45–182)
Saturation Ratios: 19 % (ref 17.9–39.5)
TIBC: 245 ug/dL — ABNORMAL LOW (ref 250–450)
UIBC: 198 ug/dL

## 2018-11-02 LAB — FERRITIN: Ferritin: 325 ng/mL (ref 24–336)

## 2018-11-02 LAB — POCT HEMOGLOBIN-HEMACUE: Hemoglobin: 10.3 g/dL — ABNORMAL LOW (ref 13.0–17.0)

## 2018-11-02 MED ORDER — EPOETIN ALFA 10000 UNIT/ML IJ SOLN
10000.0000 [IU] | INTRAMUSCULAR | Status: DC
  Administered 2018-11-02: 10000 [IU] via SUBCUTANEOUS

## 2018-11-02 MED ORDER — EPOETIN ALFA 10000 UNIT/ML IJ SOLN
INTRAMUSCULAR | Status: AC
  Administered 2018-11-02: 09:00:00 10000 [IU] via SUBCUTANEOUS
  Filled 2018-11-02: qty 1

## 2018-11-03 DIAGNOSIS — N189 Chronic kidney disease, unspecified: Secondary | ICD-10-CM | POA: Diagnosis not present

## 2018-11-03 DIAGNOSIS — F0390 Unspecified dementia without behavioral disturbance: Secondary | ICD-10-CM | POA: Diagnosis not present

## 2018-11-03 DIAGNOSIS — N2581 Secondary hyperparathyroidism of renal origin: Secondary | ICD-10-CM | POA: Diagnosis not present

## 2018-11-03 DIAGNOSIS — I2510 Atherosclerotic heart disease of native coronary artery without angina pectoris: Secondary | ICD-10-CM | POA: Diagnosis not present

## 2018-11-03 DIAGNOSIS — G9340 Encephalopathy, unspecified: Secondary | ICD-10-CM | POA: Diagnosis not present

## 2018-11-03 DIAGNOSIS — I129 Hypertensive chronic kidney disease with stage 1 through stage 4 chronic kidney disease, or unspecified chronic kidney disease: Secondary | ICD-10-CM | POA: Diagnosis not present

## 2018-11-03 DIAGNOSIS — J449 Chronic obstructive pulmonary disease, unspecified: Secondary | ICD-10-CM | POA: Diagnosis not present

## 2018-11-03 DIAGNOSIS — I130 Hypertensive heart and chronic kidney disease with heart failure and stage 1 through stage 4 chronic kidney disease, or unspecified chronic kidney disease: Secondary | ICD-10-CM | POA: Diagnosis not present

## 2018-11-03 DIAGNOSIS — Z9889 Other specified postprocedural states: Secondary | ICD-10-CM | POA: Diagnosis not present

## 2018-11-03 DIAGNOSIS — K4090 Unilateral inguinal hernia, without obstruction or gangrene, not specified as recurrent: Secondary | ICD-10-CM | POA: Diagnosis not present

## 2018-11-03 DIAGNOSIS — I5030 Unspecified diastolic (congestive) heart failure: Secondary | ICD-10-CM | POA: Diagnosis not present

## 2018-11-03 DIAGNOSIS — I5022 Chronic systolic (congestive) heart failure: Secondary | ICD-10-CM | POA: Diagnosis not present

## 2018-11-03 DIAGNOSIS — I272 Pulmonary hypertension, unspecified: Secondary | ICD-10-CM | POA: Diagnosis not present

## 2018-11-03 DIAGNOSIS — E1122 Type 2 diabetes mellitus with diabetic chronic kidney disease: Secondary | ICD-10-CM | POA: Diagnosis not present

## 2018-11-03 DIAGNOSIS — N400 Enlarged prostate without lower urinary tract symptoms: Secondary | ICD-10-CM | POA: Diagnosis not present

## 2018-11-03 DIAGNOSIS — K219 Gastro-esophageal reflux disease without esophagitis: Secondary | ICD-10-CM | POA: Diagnosis not present

## 2018-11-03 DIAGNOSIS — D631 Anemia in chronic kidney disease: Secondary | ICD-10-CM | POA: Diagnosis not present

## 2018-11-03 DIAGNOSIS — N184 Chronic kidney disease, stage 4 (severe): Secondary | ICD-10-CM | POA: Diagnosis not present

## 2018-11-06 ENCOUNTER — Telehealth: Payer: Self-pay | Admitting: Internal Medicine

## 2018-11-06 NOTE — Telephone Encounter (Signed)
Spoke with patient's daughter Genevieve Norlander and after discussing Palliative services verbal consent rec'd for Palliative.  Daughter requested an [place], this was scheduled for 11/09/18 @ 1 PM.

## 2018-11-09 ENCOUNTER — Other Ambulatory Visit: Payer: Self-pay

## 2018-11-09 ENCOUNTER — Other Ambulatory Visit: Payer: Medicare Other | Admitting: Internal Medicine

## 2018-11-09 NOTE — Progress Notes (Unsigned)
[facility] Consult Note Telephone: [phone]  Fax: [phone]  PATIENT NAME: Jeffrey Campbell DOB: 31-Mar-1932 MRN: [id number]  PRIMARY CARE PROVIDER:   [name], MD  REFERRING PROVIDER:  [name], MD [place],  [place] [id number]  RESPONSIBLE PARTY:     ASSESSMENT:        RECOMMENDATIONS and PLAN:  1.  I spent *** minutes providing this consultation,  from *** to ***. More than 50% of the time in this consultation was spent coordinating communication.   HISTORY OF PRESENT ILLNESS:  Jeffrey Campbell is a 85 y.o. year old male with multiple medical problems including ***. Palliative Care was asked to help address goals of care.   CODE STATUS:   PPS: 0% HOSPICE ELIGIBILITY/DIAGNOSIS: TBD  PAST MEDICAL HISTORY:  Past Medical History:  Diagnosis Date  . Anemia    low iron  . BPH (benign prostatic hyperplasia)   . CAD (coronary artery disease)   . Carotid artery stenosis    1-39% bilateral carotid artery stenosis and < 50% stenosis in the right CCA by dopplers 06/2017  . CKD (chronic kidney disease), stage III (HCC)    Stage 4  . Diastolic dysfunction   . Glaucoma   . Graves disease   . Heart murmur, systolic   . History of ETOH abuse   . History of PFTs 05/2010   moderate airflow obstruction w reduced DLCO by PFT   . Hyperlipemia   . Hypertension   . Hyperthyroidism 08/26/10   radioactive iodine therapy   . Memory loss   . Mitral regurgitation echo 2015   mild  . Multiple thyroid nodules   . MVP (mitral valve prolapse) 11/2012   posterior MVP  . Organic impotence   . OSA (obstructive sleep apnea)    upper airway resistance syndrome with RDI 18/hr - not on CPAP due to insurance not covering  . Pre-diabetes   . PUD (peptic ulcer disease)   . Pulmonary hypertension ([place]) echo 2015   Group 2 with pulmonary venous HTN and Group 3 with OSA  . Shutdown renal    after cardiac cath  . Upper  airway resistance syndrome     SOCIAL HX:  Social History   Tobacco Use  . Smoking status: Former Smoker    Packs/day: 2.00    Years: 40.00    Pack years: 80.00    Types: Cigarettes    Quit date: 03/29/1984    Years since quitting: 34.6  . Smokeless tobacco: Never Used  Substance Use Topics  . Alcohol use: No    Comment: quit in 1981    ALLERGIES:  Allergies  Allergen Reactions  . Iodinated Diagnostic Agents Other (See Comments)    Right heart cath 07/2016 allegedly "shut down his kidneys" Patient has stage III chronic kidney disease  . Zocor [Simvastatin] Other (See Comments)    Liver problems   . Budesonide-Formoterol Fumarate Other (See Comments)    Dry mouth  . Minoxidil Other (See Comments)  . Tiotropium Bromide Monohydrate Other (See Comments)    Dry mouth  . Tricor [Fenofibrate]           PERTINENT MEDICATIONS:  Outpatient Encounter Medications as of 11/09/2018  Medication Sig  . amLODipine (NORVASC) 10 MG tablet Take 10 mg by mouth daily.  Marland Kitchen atorvastatin (LIPITOR) 20 MG tablet TAKE 1 TABLET BY MOUTH EVERY DAY (Patient taking differently: Take 20 mg by mouth daily. )  .  Brinzolamide-Brimonidine (SIMBRINZA) 1-0.2 % SUSP Place 1 drop into both eyes 2 (two) times daily.   . calcitRIOL (ROCALTROL) 0.25 MCG capsule Take 0.25 mcg by mouth daily.  Marland Kitchen donepezil (ARICEPT) 10 MG tablet Take 1 tablet (10 mg total) by mouth at bedtime.  Marland Kitchen doxazosin (CARDURA) 4 MG tablet Take 4 mg by mouth daily.  Marland Kitchen Epoetin Alfa (PROCRIT IJ) Inject 5,000 Units as directed every 7 (seven) days.  . furosemide (LASIX) 40 MG tablet Take 1 tablet (40 mg total) by mouth daily.  . hydrALAZINE (APRESOLINE) 100 MG tablet Take 1 tablet (100 mg total) by mouth 3 (three) times daily. Please make yearly appt with Dr. Radford Pax for October. 1st attempt (Patient taking differently: Take 100 mg by mouth 3 (three) times daily. )  . levothyroxine (SYNTHROID, LEVOTHROID) 150 MCG tablet Take 150 mcg by mouth daily  before breakfast.  . losartan (COZAAR) 50 MG tablet Take 50 mg by mouth daily.  . memantine (NAMENDA) 10 MG tablet TAKE 1 TABLET BY MOUTH TWICE A DAY  . QUEtiapine (SEROQUEL) 25 MG tablet Take 0.5 tablets (12.5 mg total) by mouth at bedtime.   No facility-administered encounter medications on file as of 11/09/2018.     PHYSICAL EXAM:   General: NAD, frail appearing, thin Cardiovascular: regular rate and rhythm Pulmonary: clear ant fields Abdomen: soft, nontender, + bowel sounds GU: no suprapubic tenderness Extremities: no edema, no joint deformities Skin: no rashes Neurological: Weakness but otherwise nonfocal  Gonzella Lex, NP

## 2018-11-16 ENCOUNTER — Ambulatory Visit (HOSPITAL_COMMUNITY)
Admission: RE | Admit: 2018-11-16 | Discharge: 2018-11-16 | Disposition: A | Discharge status: Home / Self Care | Payer: Medicare Other | Source: Ambulatory Visit | Attending: Nephrology | Admitting: Nephrology

## 2018-11-16 ENCOUNTER — Other Ambulatory Visit: Payer: Self-pay

## 2018-11-16 VITALS — BP 176/57 | HR 43 | Temp 97.4°F | Resp 20

## 2018-11-16 DIAGNOSIS — I130 Hypertensive heart and chronic kidney disease with heart failure and stage 1 through stage 4 chronic kidney disease, or unspecified chronic kidney disease: Secondary | ICD-10-CM

## 2018-11-16 DIAGNOSIS — N184 Chronic kidney disease, stage 4 (severe): Secondary | ICD-10-CM | POA: Diagnosis not present

## 2018-11-16 DIAGNOSIS — D631 Anemia in chronic kidney disease: Secondary | ICD-10-CM | POA: Insufficient documentation

## 2018-11-16 LAB — POCT HEMOGLOBIN-HEMACUE: Hemoglobin: 10.1 g/dL — ABNORMAL LOW (ref 13.0–17.0)

## 2018-11-16 MED ORDER — EPOETIN ALFA 10000 UNIT/ML IJ SOLN
INTRAMUSCULAR | Status: AC
  Filled 2018-11-16: qty 1

## 2018-11-16 MED ORDER — EPOETIN ALFA 10000 UNIT/ML IJ SOLN
10000.0000 [IU] | INTRAMUSCULAR | Status: DC
  Administered 2018-11-16: 10000 [IU] via SUBCUTANEOUS

## 2018-11-30 ENCOUNTER — Other Ambulatory Visit: Payer: Self-pay

## 2018-11-30 ENCOUNTER — Ambulatory Visit (HOSPITAL_COMMUNITY)
Admission: RE | Admit: 2018-11-30 | Discharge: 2018-11-30 | Disposition: A | Discharge status: Home / Self Care | Payer: Medicare Other | Source: Ambulatory Visit | Attending: Nephrology | Admitting: Nephrology

## 2018-11-30 VITALS — BP 178/49 | HR 46 | Temp 97.2°F | Resp 20

## 2018-11-30 DIAGNOSIS — D631 Anemia in chronic kidney disease: Secondary | ICD-10-CM | POA: Diagnosis not present

## 2018-11-30 DIAGNOSIS — I130 Hypertensive heart and chronic kidney disease with heart failure and stage 1 through stage 4 chronic kidney disease, or unspecified chronic kidney disease: Secondary | ICD-10-CM

## 2018-11-30 DIAGNOSIS — N184 Chronic kidney disease, stage 4 (severe): Secondary | ICD-10-CM | POA: Diagnosis not present

## 2018-11-30 LAB — IRON AND TIBC
Iron: 52 ug/dL (ref 45–182)
Saturation Ratios: 22 % (ref 17.9–39.5)
TIBC: 241 ug/dL — ABNORMAL LOW (ref 250–450)
UIBC: 189 ug/dL

## 2018-11-30 LAB — POCT HEMOGLOBIN-HEMACUE: Hemoglobin: 10 g/dL — ABNORMAL LOW (ref 13.0–17.0)

## 2018-11-30 LAB — FERRITIN: Ferritin: 325 ng/mL (ref 24–336)

## 2018-11-30 MED ORDER — EPOETIN ALFA 10000 UNIT/ML IJ SOLN
10000.0000 [IU] | INTRAMUSCULAR | Status: DC
  Administered 2018-11-30: 09:00:00 10000 [IU] via SUBCUTANEOUS

## 2018-11-30 MED ORDER — EPOETIN ALFA 10000 UNIT/ML IJ SOLN
INTRAMUSCULAR | Status: AC
  Filled 2018-11-30: qty 1

## 2018-12-08 ENCOUNTER — Other Ambulatory Visit: Payer: Self-pay | Admitting: Cardiology

## 2018-12-08 MED ORDER — DOXAZOSIN MESYLATE 4 MG PO TABS: 4.0000 mg | ORAL_TABLET | Freq: Every day | ORAL | 2 refills | Status: DC

## 2018-12-13 DIAGNOSIS — I129 Hypertensive chronic kidney disease with stage 1 through stage 4 chronic kidney disease, or unspecified chronic kidney disease: Secondary | ICD-10-CM | POA: Diagnosis not present

## 2018-12-13 DIAGNOSIS — I5030 Unspecified diastolic (congestive) heart failure: Secondary | ICD-10-CM | POA: Diagnosis not present

## 2018-12-13 DIAGNOSIS — N184 Chronic kidney disease, stage 4 (severe): Secondary | ICD-10-CM | POA: Diagnosis not present

## 2018-12-13 DIAGNOSIS — I272 Pulmonary hypertension, unspecified: Secondary | ICD-10-CM | POA: Diagnosis not present

## 2018-12-13 DIAGNOSIS — K219 Gastro-esophageal reflux disease without esophagitis: Secondary | ICD-10-CM | POA: Diagnosis not present

## 2018-12-13 DIAGNOSIS — J449 Chronic obstructive pulmonary disease, unspecified: Secondary | ICD-10-CM | POA: Diagnosis not present

## 2018-12-13 DIAGNOSIS — Z9889 Other specified postprocedural states: Secondary | ICD-10-CM | POA: Diagnosis not present

## 2018-12-13 DIAGNOSIS — N189 Chronic kidney disease, unspecified: Secondary | ICD-10-CM | POA: Diagnosis not present

## 2018-12-13 DIAGNOSIS — N400 Enlarged prostate without lower urinary tract symptoms: Secondary | ICD-10-CM | POA: Diagnosis not present

## 2018-12-13 DIAGNOSIS — D631 Anemia in chronic kidney disease: Secondary | ICD-10-CM | POA: Diagnosis not present

## 2018-12-13 DIAGNOSIS — N2581 Secondary hyperparathyroidism of renal origin: Secondary | ICD-10-CM | POA: Diagnosis not present

## 2018-12-13 DIAGNOSIS — Z8719 Personal history of other diseases of the digestive system: Secondary | ICD-10-CM | POA: Diagnosis not present

## 2018-12-13 DIAGNOSIS — I2510 Atherosclerotic heart disease of native coronary artery without angina pectoris: Secondary | ICD-10-CM | POA: Diagnosis not present

## 2018-12-14 ENCOUNTER — Other Ambulatory Visit: Payer: Self-pay

## 2018-12-14 ENCOUNTER — Telehealth: Payer: Self-pay

## 2018-12-14 ENCOUNTER — Ambulatory Visit (HOSPITAL_COMMUNITY)
Admission: RE | Admit: 2018-12-14 | Discharge: 2018-12-14 | Disposition: A | Discharge status: Home / Self Care | Payer: Medicare Other | Source: Ambulatory Visit | Attending: Nephrology | Admitting: Nephrology

## 2018-12-14 DIAGNOSIS — D631 Anemia in chronic kidney disease: Secondary | ICD-10-CM | POA: Insufficient documentation

## 2018-12-14 DIAGNOSIS — N184 Chronic kidney disease, stage 4 (severe): Secondary | ICD-10-CM | POA: Insufficient documentation

## 2018-12-14 LAB — POCT HEMOGLOBIN-HEMACUE: Hemoglobin: 9.5 g/dL — ABNORMAL LOW (ref 13.0–17.0)

## 2018-12-14 MED ORDER — EPOETIN ALFA 10000 UNIT/ML IJ SOLN
INTRAMUSCULAR | Status: AC
  Administered 2018-12-14: 10000 [IU]
  Filled 2018-12-14: qty 1

## 2018-12-14 NOTE — Telephone Encounter (Signed)
Visit rescheduled for 12/21/2018 @ 11:00am

## 2018-12-14 NOTE — Telephone Encounter (Signed)
Duplicate entry

## 2018-12-21 ENCOUNTER — Other Ambulatory Visit: Payer: Medicare Other | Admitting: Internal Medicine

## 2018-12-21 ENCOUNTER — Other Ambulatory Visit: Payer: Self-pay

## 2018-12-22 ENCOUNTER — Other Ambulatory Visit: Payer: Self-pay

## 2018-12-27 ENCOUNTER — Other Ambulatory Visit (HOSPITAL_COMMUNITY): Payer: Self-pay

## 2018-12-28 ENCOUNTER — Other Ambulatory Visit: Payer: Self-pay

## 2018-12-28 ENCOUNTER — Ambulatory Visit (HOSPITAL_COMMUNITY)
Admission: RE | Admit: 2018-12-28 | Discharge: 2018-12-28 | Disposition: A | Discharge status: Home / Self Care | Payer: Medicare Other | Source: Ambulatory Visit | Attending: Nephrology | Admitting: Nephrology

## 2018-12-28 ENCOUNTER — Telehealth: Payer: Self-pay

## 2018-12-28 VITALS — BP 178/59 | HR 44 | Temp 97.3°F | Resp 20

## 2018-12-28 DIAGNOSIS — I130 Hypertensive heart and chronic kidney disease with heart failure and stage 1 through stage 4 chronic kidney disease, or unspecified chronic kidney disease: Secondary | ICD-10-CM

## 2018-12-28 DIAGNOSIS — N184 Chronic kidney disease, stage 4 (severe): Secondary | ICD-10-CM | POA: Diagnosis not present

## 2018-12-28 DIAGNOSIS — D631 Anemia in chronic kidney disease: Secondary | ICD-10-CM | POA: Insufficient documentation

## 2018-12-28 LAB — IRON AND TIBC
Iron: 51 ug/dL (ref 45–182)
Saturation Ratios: 20 % (ref 17.9–39.5)
TIBC: 260 ug/dL (ref 250–450)
UIBC: 209 ug/dL

## 2018-12-28 LAB — POCT HEMOGLOBIN-HEMACUE: Hemoglobin: 10 g/dL — ABNORMAL LOW (ref 13.0–17.0)

## 2018-12-28 LAB — FERRITIN: Ferritin: 288 ng/mL (ref 24–336)

## 2018-12-28 MED ORDER — EPOETIN ALFA 10000 UNIT/ML IJ SOLN
INTRAMUSCULAR | Status: AC
  Administered 2018-12-28: 10000 [IU] via SUBCUTANEOUS
  Filled 2018-12-28: qty 1

## 2018-12-28 MED ORDER — EPOETIN ALFA 10000 UNIT/ML IJ SOLN
10000.0000 [IU] | INTRAMUSCULAR | Status: DC
  Administered 2018-12-28: 09:00:00 10000 [IU] via SUBCUTANEOUS

## 2018-12-28 NOTE — Telephone Encounter (Signed)
Yes, spoke to pt who gave consent for virtual phone visit...jb

## 2018-12-29 ENCOUNTER — Telehealth (INDEPENDENT_AMBULATORY_CARE_PROVIDER_SITE_OTHER): Payer: Medicare Other | Admitting: Cardiology

## 2018-12-29 ENCOUNTER — Encounter: Payer: Self-pay | Admitting: Cardiology

## 2018-12-29 ENCOUNTER — Other Ambulatory Visit: Payer: Self-pay

## 2018-12-29 VITALS — BP 168/57 | HR 43 | Ht 69.0 in | Wt 149.0 lb

## 2018-12-29 DIAGNOSIS — I130 Hypertensive heart and chronic kidney disease with heart failure and stage 1 through stage 4 chronic kidney disease, or unspecified chronic kidney disease: Secondary | ICD-10-CM | POA: Diagnosis not present

## 2018-12-29 DIAGNOSIS — N185 Chronic kidney disease, stage 5: Secondary | ICD-10-CM

## 2018-12-29 DIAGNOSIS — I6523 Occlusion and stenosis of bilateral carotid arteries: Secondary | ICD-10-CM | POA: Diagnosis not present

## 2018-12-29 DIAGNOSIS — E7800 Pure hypercholesterolemia, unspecified: Secondary | ICD-10-CM

## 2018-12-29 DIAGNOSIS — I5032 Chronic diastolic (congestive) heart failure: Secondary | ICD-10-CM

## 2018-12-29 DIAGNOSIS — I2510 Atherosclerotic heart disease of native coronary artery without angina pectoris: Secondary | ICD-10-CM | POA: Diagnosis not present

## 2018-12-29 DIAGNOSIS — I272 Pulmonary hypertension, unspecified: Secondary | ICD-10-CM

## 2018-12-29 DIAGNOSIS — R001 Bradycardia, unspecified: Secondary | ICD-10-CM

## 2018-12-29 MED ORDER — ASPIRIN EC 81 MG PO TBEC: 81.0000 mg | DELAYED_RELEASE_TABLET | Freq: Every day | ORAL | 3 refills | Status: DC

## 2018-12-29 NOTE — Patient Instructions (Addendum)
Medication Instructions:   START BACK TAKING ASPIRIN 81 MG BY MOUTH DAILY  If you need a refill on your cardiac medications before your next appointment, please call your pharmacy.    Lab work:  WE WILL GET YOUR LABS FROM DR. Beverly Gust OFFICE FOR DR. Radford Pax TO REVIEW.   If you have labs (blood work) drawn today and your tests are completely normal, you will receive your results only by: Marland Kitchen MyChart Message (if you have MyChart) OR . A paper copy in the mail If you have any lab test that is abnormal or we need to change your treatment, we will call you to review the results.   Testing/Procedures:  Your physician has requested that you have an echocardiogram. Echocardiography is a painless test that uses sound waves to create images of your heart. It provides your doctor with information about the size and shape of your heart and how well your heart's chambers and valves are working. This procedure takes approximately one hour. There are no restrictions for this procedure.    Follow-Up: At [facility], you and your health needs are our priority.  As part of our continuing mission to provide you with exceptional heart care, we have created designated Provider Care Teams.  These Care Teams include your primary Cardiologist (physician) and Advanced Practice Providers (APPs -  Physician Assistants and Nurse Practitioners) who all work together to provide you with the care you need, when you need it.  Your physician wants you to follow-up in: 6 MONTHS WITH DR TURNER AS ANOTHER VIRTUAL VISIT  You will receive a reminder letter in the mail two months in advance. If you don't receive a letter, please call our office to schedule the follow-up appointment.   Any Other Special Instructions Will Be Listed Below (If Applicable).  DR. Radford Pax WOULD LIKE FOR YOU TO CHECK YOUR BP DAILY AT [facility] FOR ONE WEEK, WRITE THIS DOWN, AND CALL THE RESULTS BACK TO OUR OFFICE AT [phone].

## 2018-12-29 NOTE — Progress Notes (Signed)
Virtual Visit via Telephone Note   This visit type was conducted due to national recommendations for restrictions regarding the COVID-19 Pandemic (e.g. social distancing) in an effort to limit this patient's exposure and mitigate transmission in our community.  Due to his co-morbid illnesses, this patient is at least at moderate risk for complications without adequate follow up.  This format is felt to be most appropriate for this patient at this time.  The patient did not have access to video technology/had technical difficulties with video requiring transitioning to audio format only (telephone).  All issues noted in this document were discussed and addressed.  No physical exam could be performed with this format.  Please refer to the patient's chart for his  consent to telehealth for [facility].   Evaluation Performed:  Follow-up visit  This visit type was conducted due to national recommendations for restrictions regarding the COVID-19 Pandemic (e.g. social distancing).  This format is felt to be most appropriate for this patient at this time.  All issues noted in this document were discussed and addressed.  No physical exam was performed (except for noted visual exam findings with Video Visits).  Please refer to the patient's chart (MyChart message for video visits and phone note for telephone visits) for the patient's consent to telehealth for [facility].  Date:  12/29/2018   ID:  Jeffrey Campbell, DOB [birth year], MRN [id number]  Patient Location:  Home  Provider location:   [place]  PCP:  [name], MD  Cardiologist:  Fransico Him, MD  Electrophysiologist:  None   Chief Complaint:  CAD, HTN, HLD, MVP  History of Present Illness:    Jeffrey Campbell is a 85 y.o. male who presents via audio/video conferencing for a telehealth visit today.    This is a 85y.o.malewith a hx of ASCAD ([place] 2012 with 50-60% ostial LAD, 50-60%mLAD, 60% pRCA), HTN, dyslipidemia, mild MR  with posterior MVP, bradycardia and moderate pulmonary HTN (followed by Dr. Aundra Dubin).He has had an extensive w/u for pulmonary HTN (Group 34from pulmonary venous HTN in setting of elevated left heart pressure) with PFTs showing mild COPD but moderate to severely reduced DLCO, neg VQ scan for PE and chest CT with no interstitial lung disease. He does have OSA but is intolerant to CPAP. He also has bilateral renal artery stenosis, mesenteric artery stenosis and distal aortic stenosis followed by Dr. Fletcher Anon.[place] 01/2015 showed mild pulmonary venous HTN with prominent V waves in the PCWP and RA. 2D echo showed mild MR with MVP so prominent V waves felt to be due to stiff ventricles/diastolic dysfunction in setting of poorly controlled HTN. He has chronic diastolic CHF. Last echocardiogram was in June 2019 showed normal left ventricular EF, mild MR and moderate PASP 76mmHg.  He is here today for followup and is doing well.  He denies any chest pain or pressure, SOB, DOE, PND, orthopnea,  dizziness, palpitations or syncope. He has chronic LE edema that is controlled with compression hose and diuretics. He is compliant with his meds and is tolerating meds with no SE.    The patient does not have symptoms concerning for COVID-19 infection (fever, chills, cough, or new shortness of breath).   Prior CV studies:   The following studies were reviewed today:  none  Past Medical History:  Diagnosis Date   Anemia    low iron   BPH (benign prostatic hyperplasia)    CAD (coronary artery disease)    Carotid artery stenosis  1-39% bilateral carotid artery stenosis and < 50% stenosis in the right CCA by dopplers 06/2017   CKD (chronic kidney disease), stage III    Stage 4   Diastolic dysfunction    Glaucoma    Graves disease    Heart murmur, systolic    History of ETOH abuse    History of PFTs 05/2010   moderate airflow obstruction w reduced DLCO by PFT    Hyperlipemia    Hypertension      Hyperthyroidism 08/26/10   radioactive iodine therapy    Memory loss    Mitral regurgitation echo 2015   mild   Multiple thyroid nodules    MVP (mitral valve prolapse) 11/2012   posterior MVP   Organic impotence    OSA (obstructive sleep apnea)    upper airway resistance syndrome with RDI 18/hr - not on CPAP due to insurance not covering   Pre-diabetes    PUD (peptic ulcer disease)    Pulmonary hypertension ([place]) echo 2015   Group 2 with pulmonary venous HTN and Group 3 with OSA   Shutdown renal    after cardiac cath   Upper airway resistance syndrome    Past Surgical History:  Procedure Laterality Date   APPENDECTOMY     AV FISTULA PLACEMENT Left 10/18/2017   Procedure: ARTERIOVENOUS (AV) FISTULA CREATION ARM;  Surgeon: Waynetta Sandy, MD;  Location: [place];  Service: Vascular;  Laterality: Left;   CARDIAC CATHETERIZATION     CARDIAC CATHETERIZATION N/A 02/17/2015   Procedure: Right Heart Cath;  Surgeon: Larey Dresser, MD;  Location: [place] [facility];  Service: Cardiovascular;  Laterality: N/A;   ESOPHAGOGASTRODUODENOSCOPY (EGD) WITH PROPOFOL Left 10/16/2016   Procedure: ESOPHAGOGASTRODUODENOSCOPY (EGD) WITH PROPOFOL;  Surgeon: Ronnette Juniper, MD;  Location: [place];  Service: Gastroenterology;  Laterality: Left;   heart catherization     HERNIA REPAIR  10/2009   IR RADIOLOGIST EVAL & MGMT  09/28/2016   IR RADIOLOGIST EVAL & MGMT  10/19/2016   IR RADIOLOGIST EVAL & MGMT  01/18/2017   RIGHT HEART CATH N/A 07/28/2016   Procedure: Right Heart Cath;  Surgeon: Larey Dresser, MD;  Location: [place] [facility];  Service: Cardiovascular;  Laterality: N/A;     Current Meds  Medication Sig   amLODipine (NORVASC) 10 MG tablet Take 10 mg by mouth daily.   atorvastatin (LIPITOR) 20 MG tablet TAKE 1 TABLET BY MOUTH EVERY DAY (Patient taking differently: Take 20 mg by mouth daily. )   Brinzolamide-Brimonidine (SIMBRINZA) 1-0.2 % SUSP Place 1 drop into  both eyes 2 (two) times daily.    calcitRIOL (ROCALTROL) 0.25 MCG capsule Take 0.25 mcg by mouth daily.   donepezil (ARICEPT) 10 MG tablet Take 1 tablet (10 mg total) by mouth at bedtime.   doxazosin (CARDURA) 4 MG tablet Take 1 tablet (4 mg total) by mouth daily.   Epoetin Alfa (PROCRIT IJ) Inject 5,000 Units as directed every 7 (seven) days.   furosemide (LASIX) 40 MG tablet Take 1 tablet (40 mg total) by mouth daily.   hydrALAZINE (APRESOLINE) 100 MG tablet Take 1 tablet (100 mg total) by mouth 3 (three) times daily. Please make yearly appt with Dr. Radford Pax for October. 1st attempt (Patient taking differently: Take 100 mg by mouth 3 (three) times daily. )   levothyroxine (SYNTHROID, LEVOTHROID) 150 MCG tablet Take 150 mcg by mouth daily before breakfast.   losartan (COZAAR) 50 MG tablet Take 50 mg by mouth daily.   memantine (  NAMENDA) 10 MG tablet TAKE 1 TABLET BY MOUTH TWICE A DAY   QUEtiapine (SEROQUEL) 25 MG tablet Take 0.5 tablets (12.5 mg total) by mouth at bedtime.     Allergies:   Iodinated diagnostic agents, Zocor [simvastatin], Budesonide-formoterol fumarate, Minoxidil, Tiotropium bromide monohydrate, and Tricor [fenofibrate]   Social History   Tobacco Use   Smoking status: Former Smoker    Packs/day: 2.00    Years: 40.00    Pack years: 80.00    Types: Cigarettes    Quit date: 03/29/1984    Years since quitting: 34.7   Smokeless tobacco: Never Used  Substance Use Topics   Alcohol use: No    Comment: quit in 1981   Drug use: No     Family Hx: The patient's family history includes Colon cancer in his brother, maternal uncle, and mother; Hypertension in his father; Kidney failure in his father; Lung disease in his brother.  ROS:   Please see the history of present illness.     All other systems reviewed and are negative.   Labs/Other Tests and Data Reviewed:    Recent Labs: 05/20/2018: B Natriuretic Peptide 3,913.9 09/02/2018: TSH 0.183 09/03/2018: ALT 43;  BUN 64; Creatinine, Ser 3.37; Magnesium 1.9; Platelets 126 09/07/2018: Potassium 4.2; Sodium 143 12/28/2018: Hemoglobin 10.0   Recent Lipid Panel Lab Results  Component Value Date/Time   CHOL 117 06/27/2017 09:25 AM   TRIG 45 06/27/2017 09:25 AM   HDL 60 06/27/2017 09:25 AM   CHOLHDL 2.0 06/27/2017 09:25 AM   CHOLHDL 1.9 01/20/2016 09:09 AM   LDLCALC 48 06/27/2017 09:25 AM    Wt Readings from Last 3 Encounters:  12/29/18 149 lb (67.6 kg)  09/18/18 143 lb 8 oz (65.1 kg)  09/03/18 127 lb 6.8 oz (57.8 kg)     Objective:    Vital Signs:  BP (!) 168/57    Pulse (!) 43    Ht 5\' 9"  (1.753 m)    Wt 149 lb (67.6 kg)    BMI 22.00 kg/m     ASSESSMENT & PLAN:    1. ASCAD  - [place] 2012 with 50-60% ostial LAD, 50-60%mLAD, 60% pRCA. -denies any anginal sx -continue statin -He has not been taking ASA recently so I asked him to start a baby ASA 81mg  daily  2. Chronic diastolic heart failure  -weight stable -denies any SOB.  He has chronic LE edema that is controlled on diuretics and compression hose -continue Lasix 40mg  BID -his renal function is followed by [facility] so I will get a copy of last labs from them  3. Pulmonary hypertension -extensive w/u for pulmonary HTN (Group 77from pulmonary venous HTN in setting of elevated left heart pressure) - PFTs showing mild COPD but moderate to severely reduced DLCO, neg VQ scan for PE and chest CT with no interstitial lung disease.  -He does have OSA but is intolerant to CPAP.  -RHC with mildly elevated right and left heart pressures with moderate PHTN with PVR 1.74 WU and PCW 60mmHg c/w pulmonary venous HTN with a component of Type 3 from OSA and not a candidate for pulmonary vasodilators. -Related to pulmonary venous hypertension from his chronic diastolic heart failure.   -continue Lasix 40mg  BID -repeat 2D echo  4. Bilateral carotid artery stenosis -carotid Dopplers 04/2018 showed bilateral carotid stenosis of 50-69%  stenosis -continue statin and restart ASA  5Hypertension  -BP elevated this am but just took his medication -continue amlodipine 10mg  daily, Hydralazine 100mg  TID, Doxazosin  4mg  daily and Losartan 50mg  daily.  -I have asked him to check his BP daily at lunch and call with the results in 1 week  6. Hyperlipidemia  - LDL goal less than 70.  -get a copy of labs from nephrologist -continue atorvastatin 20mg  daily  7. Chronic kidney disease stage IV -he is followed by nephrology and creatinine is running around 4.8.   8. Bradycardia -remains asymptomatic -His heart rate normally runs in the 40s.   COVID-19 Education: The signs and symptoms of COVID-19 were discussed with the patient and how to seek care for testing (follow up with PCP or arrange E-visit).  The importance of social distancing was discussed today.  Patient Risk:   After full review of this patient's clinical status, I feel that they are at least moderate risk at this time.  Time:   Today, I have spent 20 minutes directly with the patient on telemedicine discussing medical problems including HTN, HLD, carotid stenosis, CAD, CHF.  We also reviewed the symptoms of COVID 19 and the ways to protect against contracting the virus with telehealth technology.  I spent an additional 5 minutes reviewing patient's chart including labs.  Medication Adjustments/Labs and Tests Ordered: Current medicines are reviewed at length with the patient today.  Concerns regarding medicines are outlined above.  Tests Ordered: No orders of the defined types were placed in this encounter.  Medication Changes: No orders of the defined types were placed in this encounter.   Disposition:  Follow up in 6 month(s) with me Virtual  Signed, Fransico Him, MD  12/29/2018 9:35 AM    [place]

## 2019-01-05 ENCOUNTER — Other Ambulatory Visit: Payer: Self-pay

## 2019-01-05 ENCOUNTER — Ambulatory Visit (HOSPITAL_COMMUNITY): Payer: Medicare Other | Attending: Cardiology

## 2019-01-05 DIAGNOSIS — I272 Pulmonary hypertension, unspecified: Secondary | ICD-10-CM | POA: Insufficient documentation

## 2019-01-05 DIAGNOSIS — I6523 Occlusion and stenosis of bilateral carotid arteries: Secondary | ICD-10-CM | POA: Diagnosis not present

## 2019-01-05 DIAGNOSIS — N185 Chronic kidney disease, stage 5: Secondary | ICD-10-CM | POA: Diagnosis not present

## 2019-01-05 DIAGNOSIS — I2510 Atherosclerotic heart disease of native coronary artery without angina pectoris: Secondary | ICD-10-CM | POA: Diagnosis not present

## 2019-01-05 DIAGNOSIS — I5032 Chronic diastolic (congestive) heart failure: Secondary | ICD-10-CM | POA: Insufficient documentation

## 2019-01-05 DIAGNOSIS — E7800 Pure hypercholesterolemia, unspecified: Secondary | ICD-10-CM | POA: Diagnosis not present

## 2019-01-05 DIAGNOSIS — I130 Hypertensive heart and chronic kidney disease with heart failure and stage 1 through stage 4 chronic kidney disease, or unspecified chronic kidney disease: Secondary | ICD-10-CM | POA: Diagnosis not present

## 2019-01-05 DIAGNOSIS — R001 Bradycardia, unspecified: Secondary | ICD-10-CM

## 2019-01-08 ENCOUNTER — Telehealth: Payer: Self-pay | Admitting: *Deleted

## 2019-01-08 ENCOUNTER — Other Ambulatory Visit: Payer: Self-pay | Admitting: *Deleted

## 2019-01-08 DIAGNOSIS — I5032 Chronic diastolic (congestive) heart failure: Secondary | ICD-10-CM

## 2019-01-08 DIAGNOSIS — I272 Pulmonary hypertension, unspecified: Secondary | ICD-10-CM

## 2019-01-08 DIAGNOSIS — N185 Chronic kidney disease, stage 5: Secondary | ICD-10-CM

## 2019-01-08 DIAGNOSIS — I130 Hypertensive heart and chronic kidney disease with heart failure and stage 1 through stage 4 chronic kidney disease, or unspecified chronic kidney disease: Secondary | ICD-10-CM

## 2019-01-08 NOTE — Progress Notes (Signed)
Order for PRO-BNP placed for this pt to have done tomorrow, as ordered by Dr. Radford Pax today. Order was initially incorrectly placed.  Correction made and lab tech Katrina aware.

## 2019-01-08 NOTE — Telephone Encounter (Signed)
I spoke with pt and reviewed results/recommendations with him.  He will come to office tomorrow at 10:45 for BNP.  Referral made to Dr Aundra Dubin in [place] clinic.

## 2019-01-08 NOTE — Telephone Encounter (Signed)
-----   Message from Nuala Alpha, LPN sent at [id number]  8:40 AM EDT -----  ----- Message ----- From: Sueanne Margarita, MD Sent: 01/07/2019   8:21 PM EDT To: [name], MD, [facility] Div [facility] Triage  2D echo showed normal LVF with mildly thickened heart muscle, moderately reduced RVF with RV enlargemet, severely enlarged atria, moderate thickening and calcification of the MV leaflets and severe pulmonary HTN.  Compared to prior echo, RV function has declined and PAP are more elevated. He has seen Dr. Aundra Dubin for pulmonary HTN in the past and I would like him seen again.  Please have patient come in for BNP

## 2019-01-09 ENCOUNTER — Other Ambulatory Visit: Payer: Medicare Other

## 2019-01-09 ENCOUNTER — Other Ambulatory Visit: Payer: Self-pay

## 2019-01-09 DIAGNOSIS — N185 Chronic kidney disease, stage 5: Secondary | ICD-10-CM

## 2019-01-09 DIAGNOSIS — I5032 Chronic diastolic (congestive) heart failure: Secondary | ICD-10-CM

## 2019-01-09 DIAGNOSIS — I130 Hypertensive heart and chronic kidney disease with heart failure and stage 1 through stage 4 chronic kidney disease, or unspecified chronic kidney disease: Secondary | ICD-10-CM

## 2019-01-09 DIAGNOSIS — I272 Pulmonary hypertension, unspecified: Secondary | ICD-10-CM | POA: Diagnosis not present

## 2019-01-09 LAB — PRO B NATRIURETIC PEPTIDE: NT-Pro BNP: 21225 pg/mL — ABNORMAL HIGH (ref 0–486)

## 2019-01-10 ENCOUNTER — Other Ambulatory Visit: Payer: Medicare Other | Admitting: *Deleted

## 2019-01-10 ENCOUNTER — Other Ambulatory Visit (HOSPITAL_COMMUNITY): Payer: Self-pay | Admitting: *Deleted

## 2019-01-10 ENCOUNTER — Telehealth: Payer: Self-pay | Admitting: *Deleted

## 2019-01-10 DIAGNOSIS — I5032 Chronic diastolic (congestive) heart failure: Secondary | ICD-10-CM | POA: Diagnosis not present

## 2019-01-10 DIAGNOSIS — N184 Chronic kidney disease, stage 4 (severe): Secondary | ICD-10-CM | POA: Diagnosis not present

## 2019-01-10 LAB — BASIC METABOLIC PANEL
BUN/Creatinine Ratio: 21 (ref 10–24)
BUN: 84 mg/dL (ref 8–27)
CO2: 26 mmol/L (ref 20–29)
Calcium: 10.1 mg/dL (ref 8.6–10.2)
Chloride: 107 mmol/L — ABNORMAL HIGH (ref 96–106)
Creatinine, Ser: 4.08 mg/dL — ABNORMAL HIGH (ref 0.76–1.27)
GFR calc Af Amer: 15 mL/min/{1.73_m2} — ABNORMAL LOW (ref >59)
GFR calc non Af Amer: 13 mL/min/{1.73_m2} — ABNORMAL LOW (ref >59)
Glucose: 89 mg/dL (ref 65–99)
Potassium: 4.8 mmol/L (ref 3.5–5.2)
Sodium: 144 mmol/L (ref 134–144)

## 2019-01-10 NOTE — Telephone Encounter (Signed)
Increase lasix to 80mg  BID and make sure he gets in with AHF service ASAP and that Nephrology is aware of labs

## 2019-01-10 NOTE — Telephone Encounter (Signed)
Called wife, advised appt was rescheduled to this Friday at 3:20p.  Address and gate code provided.  Verbalized understanding.

## 2019-01-10 NOTE — Telephone Encounter (Signed)
-----   Message from Thompson Grayer, RN sent at 01/10/2019 11:21 AM EDT ----- Regarding: RE: sooner appt That would be great.  Just call the number listed and talk with his wife.  She says he has some dementia.  I am waiting to hear back from [facility]. Thanks ----- Message ----- From: Valeda Malm, RN Sent: 01/10/2019  11:00 AM EDT To: Thompson Grayer, RN Subject: RE: sooner appt                                I can add him on this Friday to see Dr Aundra Dubin at 3:20p? Does that work? Should I call him? ----- Message ----- From: Thompson Grayer, RN Sent: 01/10/2019  10:50 AM EDT To: [facility] Triage Pool Subject: sooner appt                                    Dr Radford Pax is asking if this pt can get sooner appt in CHF clinic.  Can be with APP if Dr Aundra Dubin doesn't have anything sooner. I am also trying to get him into [facility] sooner. Thanks, Fraser Din

## 2019-01-10 NOTE — Telephone Encounter (Signed)
I reviewed lab results with pt and his wife.  Pt reports he is currently taking furosemide 80 mg daily.  Will review with Dr. Radford Pax. He will come to office for stat BMP today at 9:30

## 2019-01-10 NOTE — Telephone Encounter (Signed)
BMET results reviewed with Dr Radford Pax.  Pt should not increase furosemide. Continue current dose.  Needs to see renal ASAP I spoke with pt's wife and reviewed lab results/recommendations with her.  His next appt with nephrology is 10/26.  I told pt's wife I would contact [facility] to request appointment as soon as possible.

## 2019-01-10 NOTE — Telephone Encounter (Signed)
I placed call to [place] and left message on voicemail for assistant working with Dr Justin Mend

## 2019-01-10 NOTE — Telephone Encounter (Signed)
-----   Message from Frederik Schmidt, RN sent at 01/10/2019  7:34 AM EDT -----  ----- Message ----- From: Sueanne Margarita, MD Sent: 01/09/2019  10:21 PM EDT To: [name], MD, Cv Div [facility] Triage  BNP markedly elevated c/w acute diastolic CHF - patient only has LE edema.  Volume overload likely related to worsening renal function.  I would like him to come in for BMET stat tomorrow 10/14 and increase Lasix to 40mg  BID PO.  He needs to get into advanced HF clinic this week

## 2019-01-11 ENCOUNTER — Other Ambulatory Visit: Payer: Self-pay

## 2019-01-11 ENCOUNTER — Encounter (HOSPITAL_COMMUNITY): Payer: Medicare Other

## 2019-01-11 ENCOUNTER — Ambulatory Visit (HOSPITAL_COMMUNITY)
Admission: RE | Admit: 2019-01-11 | Discharge: 2019-01-11 | Disposition: A | Discharge status: Home / Self Care | Payer: Medicare Other | Source: Ambulatory Visit | Attending: Nephrology | Admitting: Nephrology

## 2019-01-11 VITALS — BP 176/56 | HR 41 | Temp 97.5°F | Resp 20 | Ht 69.0 in | Wt 155.0 lb

## 2019-01-11 DIAGNOSIS — I2510 Atherosclerotic heart disease of native coronary artery without angina pectoris: Secondary | ICD-10-CM | POA: Diagnosis not present

## 2019-01-11 DIAGNOSIS — I272 Pulmonary hypertension, unspecified: Secondary | ICD-10-CM | POA: Diagnosis not present

## 2019-01-11 DIAGNOSIS — I130 Hypertensive heart and chronic kidney disease with heart failure and stage 1 through stage 4 chronic kidney disease, or unspecified chronic kidney disease: Secondary | ICD-10-CM | POA: Diagnosis not present

## 2019-01-11 DIAGNOSIS — D631 Anemia in chronic kidney disease: Secondary | ICD-10-CM | POA: Insufficient documentation

## 2019-01-11 DIAGNOSIS — N184 Chronic kidney disease, stage 4 (severe): Secondary | ICD-10-CM | POA: Insufficient documentation

## 2019-01-11 DIAGNOSIS — I5030 Unspecified diastolic (congestive) heart failure: Secondary | ICD-10-CM | POA: Diagnosis not present

## 2019-01-11 DIAGNOSIS — I129 Hypertensive chronic kidney disease with stage 1 through stage 4 chronic kidney disease, or unspecified chronic kidney disease: Secondary | ICD-10-CM | POA: Diagnosis not present

## 2019-01-11 DIAGNOSIS — J449 Chronic obstructive pulmonary disease, unspecified: Secondary | ICD-10-CM | POA: Diagnosis not present

## 2019-01-11 LAB — POCT HEMOGLOBIN-HEMACUE: Hemoglobin: 10 g/dL — ABNORMAL LOW (ref 13.0–17.0)

## 2019-01-11 MED ORDER — EPOETIN ALFA 10000 UNIT/ML IJ SOLN
INTRAMUSCULAR | Status: AC
  Administered 2019-01-11: 10000 [IU] via SUBCUTANEOUS
  Filled 2019-01-11: qty 1

## 2019-01-11 MED ORDER — SODIUM CHLORIDE 0.9 % IV SOLN
510.0000 mg | Freq: Once | INTRAVENOUS | Status: AC
  Administered 2019-01-11: 510 mg via INTRAVENOUS
  Filled 2019-01-11: qty 17

## 2019-01-11 MED ORDER — EPOETIN ALFA 10000 UNIT/ML IJ SOLN
10000.0000 [IU] | INTRAMUSCULAR | Status: DC
  Administered 2019-01-11: 09:00:00 10000 [IU] via SUBCUTANEOUS

## 2019-01-12 ENCOUNTER — Encounter (HOSPITAL_COMMUNITY): Payer: Self-pay | Admitting: Cardiology

## 2019-01-12 ENCOUNTER — Other Ambulatory Visit: Payer: Self-pay

## 2019-01-12 ENCOUNTER — Ambulatory Visit (HOSPITAL_COMMUNITY)
Admission: RE | Admit: 2019-01-12 | Discharge: 2019-01-12 | Disposition: A | Discharge status: Home / Self Care | Payer: Medicare Other | Source: Ambulatory Visit | Attending: Cardiology | Admitting: Cardiology

## 2019-01-12 VITALS — BP 124/50 | HR 44 | Wt 153.0 lb

## 2019-01-12 DIAGNOSIS — I080 Rheumatic disorders of both mitral and aortic valves: Secondary | ICD-10-CM | POA: Diagnosis not present

## 2019-01-12 DIAGNOSIS — I739 Peripheral vascular disease, unspecified: Secondary | ICD-10-CM | POA: Insufficient documentation

## 2019-01-12 DIAGNOSIS — I4892 Unspecified atrial flutter: Secondary | ICD-10-CM | POA: Diagnosis not present

## 2019-01-12 DIAGNOSIS — E785 Hyperlipidemia, unspecified: Secondary | ICD-10-CM | POA: Insufficient documentation

## 2019-01-12 DIAGNOSIS — Z79899 Other long term (current) drug therapy: Secondary | ICD-10-CM | POA: Diagnosis not present

## 2019-01-12 DIAGNOSIS — I5032 Chronic diastolic (congestive) heart failure: Secondary | ICD-10-CM | POA: Diagnosis not present

## 2019-01-12 DIAGNOSIS — E039 Hypothyroidism, unspecified: Secondary | ICD-10-CM | POA: Insufficient documentation

## 2019-01-12 DIAGNOSIS — I130 Hypertensive heart and chronic kidney disease with heart failure and stage 1 through stage 4 chronic kidney disease, or unspecified chronic kidney disease: Secondary | ICD-10-CM | POA: Insufficient documentation

## 2019-01-12 DIAGNOSIS — E1122 Type 2 diabetes mellitus with diabetic chronic kidney disease: Secondary | ICD-10-CM | POA: Diagnosis not present

## 2019-01-12 DIAGNOSIS — Z7901 Long term (current) use of anticoagulants: Secondary | ICD-10-CM | POA: Insufficient documentation

## 2019-01-12 DIAGNOSIS — Z841 Family history of disorders of kidney and ureter: Secondary | ICD-10-CM | POA: Diagnosis not present

## 2019-01-12 DIAGNOSIS — I483 Typical atrial flutter: Secondary | ICD-10-CM

## 2019-01-12 DIAGNOSIS — Z87891 Personal history of nicotine dependence: Secondary | ICD-10-CM | POA: Diagnosis not present

## 2019-01-12 DIAGNOSIS — Z79890 Hormone replacement therapy (postmenopausal): Secondary | ICD-10-CM | POA: Diagnosis not present

## 2019-01-12 DIAGNOSIS — I272 Pulmonary hypertension, unspecified: Secondary | ICD-10-CM | POA: Insufficient documentation

## 2019-01-12 DIAGNOSIS — R001 Bradycardia, unspecified: Secondary | ICD-10-CM | POA: Insufficient documentation

## 2019-01-12 DIAGNOSIS — G4733 Obstructive sleep apnea (adult) (pediatric): Secondary | ICD-10-CM | POA: Insufficient documentation

## 2019-01-12 DIAGNOSIS — F0390 Unspecified dementia without behavioral disturbance: Secondary | ICD-10-CM | POA: Diagnosis not present

## 2019-01-12 DIAGNOSIS — Z8249 Family history of ischemic heart disease and other diseases of the circulatory system: Secondary | ICD-10-CM | POA: Insufficient documentation

## 2019-01-12 DIAGNOSIS — N184 Chronic kidney disease, stage 4 (severe): Secondary | ICD-10-CM

## 2019-01-12 DIAGNOSIS — J449 Chronic obstructive pulmonary disease, unspecified: Secondary | ICD-10-CM | POA: Diagnosis not present

## 2019-01-12 DIAGNOSIS — I2510 Atherosclerotic heart disease of native coronary artery without angina pectoris: Secondary | ICD-10-CM | POA: Diagnosis not present

## 2019-01-12 MED ORDER — FUROSEMIDE 80 MG PO TABS: ORAL_TABLET | ORAL | 3 refills | Status: DC

## 2019-01-12 MED ORDER — APIXABAN 2.5 MG PO TABS: 2.5000 mg | ORAL_TABLET | Freq: Two times a day (BID) | ORAL | 6 refills | Status: DC

## 2019-01-12 NOTE — Telephone Encounter (Signed)
I placed follow up phone call to [place] and left message on voicemail for Dr Jason Nest assistant.

## 2019-01-12 NOTE — Patient Instructions (Addendum)
DISCONTINUE Aspirin  START Eliquis 2.5mg  (1 tab) twice a day  After taking Lasix 80mg  twice a day for 5 days, DECREASE Lasix to 80mg  in the morning and 40mg  in the evening.    You have been ordered a PYP Scan.  This is done in the Radiology Department of [facility].  When you come for this test please plan to be there 2-3 hours.  Your physician recommends that you schedule a follow-up appointment in: 2 weeks with Dr Aundra Dubin  At the [facility], you and your health needs are our priority. As part of our continuing mission to provide you with exceptional heart care, we have created designated Provider Care Teams. These Care Teams include your primary Cardiologist (physician) and Advanced Practice Providers (APPs- Physician Assistants and Nurse Practitioners) who all work together to provide you with the care you need, when you need it.   You may see any of the following providers on your designated Care Team at your next follow up: Marland Kitchen Dr Glori Bickers . Dr Loralie Champagne . Darrick Grinder, NP . Lyda Jester, PA   Please be sure to bring in all your medications bottles to every appointment.

## 2019-01-12 NOTE — Telephone Encounter (Signed)
I spoke with [place] (assistant for Dr. Justin Mend).  Pt was seen by [facility] yesterday with follow up planned for October 26,2020.

## 2019-01-14 NOTE — Progress Notes (Signed)
Patient ID: Jeffrey Campbell, male   DOB: Mar 12, 1933, 85 y.o.   MRN: [id number] PCP: Dr. Drema [place] Cardiology: Dr Radford Pax HF Cardiology: Dr. Aundra Dubin  85 y.o. with history of CAD, PAD, CKD, sinus bradycardia, and pulmonary HTN by echo presents for followup of CHF.  Patient had an echo in 10/16 showing normal LV EF, but moderately dilated RV with PASP 68 mmHg.  He had an extensive workup for pulmonary hypertension. PFTs showed mild obstruction but moderate to severely decreased DLCO.  V/Q scan was negative for evidence of acute or chronic PE.  He had had a CT chest back in 2/16 that showed no definitive evidence for interstitial lung disease.  [place] in 11/16 showed pulmonary venous hypertension and Lasix was started at 20 mg daily.   Patient is bradycardic with HR in 40s.  This seems to be asymptomatic and is chronic. HR 44 today.   He had rather diffuse moderate CAD on LHC in 2012 but no intervention.  Cardiolite in 4/16 showed no ischemia or infarction.  He has bilateral renal artery stenosis by renal artery dopplers in 10/16.  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  No claudication or intestinal angina.  It has been decided not to intervene on his renal arteries.   He had repeat echo in 41/96 with PA systolic pressure 77 mmHg, normal EF and evidence for worsening diastolic function.  RHC was done in 5/18, again showing primarily pulmonary venous hypertension.  Echo in 6/18 showed PASP 46 mmHg with EF 60-65%.  Most recent echo in 10/20 showed EF 55-60%, moderately dilated and dysfunctional RV, PASP 72, dilated IVC, severe biatrial enlargement.   Patient was referred back by Dr. Radford Pax for re-evaluation of CHF/RV failure and pulmonary hypertension.  He says that he has not felt worse recently.  No chest pain.  Per patient and wife, he does not get short of breath walking around on flat ground though he is not very active.  He is short of breath walking up a flight of stairs.  No  orthopnea/PND.  No lightheadedness/syncope.  No falls. Recently noted to be volume overloaded on exam and told to increase Lasix to 80 mg bid x 5 days.  Patient has a history of sinus bradycardia.  Today, he is in atrial flutter with rate 42.   ECG (personally reviewed): atrial flutter rate 42.   Labs (4/16): LDL 70 Labs (10/16): BNP 621, K 3.9, creatinine 2.51 Labs (11/16): K 4.2, creatinine 2.49 Labs (7/18): K 3.7, creatinine 2.82, hgb 7.5 Labs (10/20): K 4.8, creatinine 4.08, hgb 10, NT-proBNP 21225  PMH: 1. CAD: LHC (2012) with 50-60% ostial LAD, 50-60% mLAD, 60% pRCA.  Cardiolite (4/16) with EF 58%, no ischemia or infarction.  2. [place] 3. HTN 4. Hyperlipidemia 5. Graves disease treated with RAI, now hypothyroid. 6. PUD 7. CKD III-IV 8. Type II diabetes 9. OSA: Not using CPAP 10. Sinus bradycardia: Holter (10/16) with average HR 44, occasional junctional rhythm.  11. Mitral valve prolapse: Moderate MR on 12/17 echo, minimal reported on 6/18 echo.  12. Chronic diastolic CHF: Echo (22/29) with EF 60-65%, grade II diastolic dysfunction, mild AI, MVP with mild MR, moderate RV dilation with normal systolic function, PASP 68 mmHg.   - Echo (12/17): EF 60-65%, moderate MR, grade III diastolic dysfunction, PASP 77 mmHg. - Echo (6/18): EF 60-65%, PASP 46 mmHg.  - Echo (10/20): EF 55-60%, mild LV, moderately decreased RV systolic function, moderate RV dilation, severe biatrial enlargement, dilated  IVC, PASP 72 mmHg.  13. 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  14. Pulmonary hypertension: PASP 68 mmHg on 10/16 echo.  PFTs (11/16) with FEV1 76%, FVC 78%, ratio 95%, TLC 73%, DLCO 48% => mild obstruction with moderate to severely decreased DLCO.  CT chest (2/16) without definitive interstitial lung disease.  V/Q scan 11/16 with no perfusion defects.  RHC (11/16) with mean RA 5 (v-waves to 18), PA 45/9  mean 24, PCWP mean 13 (v-waves to 33), CI 3.42, PVR 1.8 WU.  - RHC (5/18): mean RA 8, PA 68/12 mean 33, mean PCWP 21, CI 3.91, PVR 1.74 WU 15. Carotid stenosis: Carotid dopplers (10/16) with mild disease.  - Carotid dopplers (2/20): 50-69% BICA stenosis.  16. Dementia 17. COPD: Mild 18. Atrial flutter: Noted in 10/20.   SH: Married, lives in [place], retired Scientist, research (medical), heavy smoker previously but quit in 1980s.    FH: HTN, CKD.  ROS: All systems reviewed and negative except as per HPI.   Current Outpatient Medications  Medication Sig Dispense Refill  . amLODipine (NORVASC) 10 MG tablet Take 10 mg by mouth daily.    Marland Kitchen atorvastatin (LIPITOR) 20 MG tablet TAKE 1 TABLET BY MOUTH EVERY DAY (Patient taking differently: Take 20 mg by mouth daily. ) 90 tablet 2  . Brinzolamide-Brimonidine (SIMBRINZA) 1-0.2 % SUSP Place 1 drop into both eyes 2 (two) times daily.     . calcitRIOL (ROCALTROL) 0.25 MCG capsule Take 0.25 mcg by mouth daily.    Marland Kitchen donepezil (ARICEPT) 10 MG tablet Take 1 tablet (10 mg total) by mouth at bedtime. 30 tablet 11  . doxazosin (CARDURA) 4 MG tablet Take 1 tablet (4 mg total) by mouth daily. 90 tablet 2  . Epoetin Alfa (PROCRIT IJ) Inject 5,000 Units as directed every 7 (seven) days.    . furosemide (LASIX) 80 MG tablet Take 1 tablet (80 mg total) by mouth every morning AND 0.5 tablets (40 mg total) every evening. 150 tablet 3  . hydrALAZINE (APRESOLINE) 100 MG tablet Take 1 tablet (100 mg total) by mouth 3 (three) times daily. Please make yearly appt with Dr. Radford Pax for October. 1st attempt (Patient taking differently: Take 100 mg by mouth 3 (three) times daily. ) 90 tablet 1  . levothyroxine (SYNTHROID, LEVOTHROID) 150 MCG tablet Take 150 mcg by mouth daily before breakfast.    . losartan (COZAAR) 50 MG tablet Take 50 mg by mouth daily.    . memantine (NAMENDA) 10 MG tablet TAKE 1 TABLET BY MOUTH TWICE A DAY 180 tablet 3  . QUEtiapine (SEROQUEL) 25 MG tablet Take 0.5  tablets (12.5 mg total) by mouth at bedtime. 15 tablet 0  . apixaban (ELIQUIS) 2.5 MG TABS tablet Take 1 tablet (2.5 mg total) by mouth 2 (two) times daily. 60 tablet 6   No current facility-administered medications for this encounter.    BP (!) 124/50   Pulse (!) 44   Wt 69.4 kg (153 lb)   SpO2 97%   BMI 22.59 kg/m  General: NAD Neck: JVP 10-12 cm, no thyromegaly or thyroid nodule.  Lungs: Clear to auscultation bilaterally with normal respiratory effort. CV: Nondisplaced PMI.  Heart brady, irregular S1/S2, no S3/S4, 2/6 SEM RUSB.  2+ edema to knees bilaterally.  No carotid bruit.  Normal pedal pulses.  Abdomen: Soft, nontender, no hepatosplenomegaly, no distention.  Skin: Intact without lesions or rashes.  Neurologic: Alert and oriented x 3.  Psych: Normal affect. Extremities: No clubbing or cyanosis.  HEENT: Normal.   Assessment/Plan: 1. Pulmonary hypertension: Noted by echo in 2016 with PASP 68 mmHg and moderately dilated RV.  PFTs showed mild obstruction with moderate to severely decreased DLCO, suggesting a pulmonary vascular problem.  CT chest in 2/16 did not show definitive ILD.  V/Q scan did not show evidence for acute or chronic PE.  He carries a history of OSA but is not using CPAP.  RHC showed mild pulmonary venous hypertension, PVR was only 1.8 WU.   He had echo in 12/17 with PASP 77 mmHg but RHC again in 5/18 showed pulmonary venous hypertension.  Suspect he has significant diastolic CHF.  Most recent echo in 10/20 showed PASP 72 mmHg.   - Treatment will be diuresis, would avoid selective pulmonary vasodilators given primarily pulmonary venous hypertension.   2. Chronic diastolic CHF: With prominent RV failure.  Patient has diastolic LV failure with pulmonary venous HTN on prior RHC.  CKD stage IV increases tendency to retain fluid.  On exam today, he is significantly volume overloaded.  Conversion to atrial flutter may have worsened volume retention.  About a day ago, he  increased Lasix to 80 mg bid x 5 days.  - Continue Lasix 80 mg bid x 5 days as planned but then continue Lasix after that at 80 qam/40 qpm.   BMET today and again in 10 days.    - I will arrange for PYP scan to assess for evidence of transthyretin cardiac amyloidosis.  3. CKD: Stage IV.  Closely followed by Dr. Justin Mend.  Recent creatinine up to 4.   4. HTN: Suspect bilateral renal artery stenosis plays a role.  BP now controlled.  - Based on review of notes it appears that intervention on renal arteries has been decided against.  5. PAD: Renal artery stenosis, mesenteric artery stenosis, distal aortic stenosis. - Continue statin.  6. CAD: Stable with no chest pain.  Continue statin. Goal LDL < 70.  4/16 Cardiolite with no ischemia. No ASA given need for anticoagulation.  7. Sinus bradycardia:  Has been chronic and asymptomatic.  Today, he is in atrial flutter.  8. Atrial flutter: Slow response, HR 40s.  He has not been noted to have this before.  He does not feel palpitations.  It is possible that atrial flutter could have worsened his HF.   - Start apixaban 2.5 mg bid (reduced dosing based on age and creatinine).  Stop ASA.  - He will return in 2 wks.  If he remains in flutter, will arrange for DCCV after 4 wks of Eliquis.   Loralie Champagne 01/14/2019

## 2019-01-18 ENCOUNTER — Other Ambulatory Visit: Payer: Self-pay

## 2019-01-18 ENCOUNTER — Encounter: Payer: Self-pay | Admitting: Neurology

## 2019-01-18 ENCOUNTER — Ambulatory Visit (INDEPENDENT_AMBULATORY_CARE_PROVIDER_SITE_OTHER): Payer: Medicare Other | Admitting: Neurology

## 2019-01-18 VITALS — BP 200/66 | HR 41 | Temp 97.8°F | Ht 68.0 in | Wt 149.4 lb

## 2019-01-18 DIAGNOSIS — F0390 Unspecified dementia without behavioral disturbance: Secondary | ICD-10-CM

## 2019-01-18 NOTE — Progress Notes (Signed)
I have reviewed and agreed above plan. 

## 2019-01-18 NOTE — Progress Notes (Signed)
PATIENT: Jeffrey Campbell DOB: [birth year]  REASON FOR VISIT: follow up HISTORY FROM: patient  HISTORY OF PRESENT ILLNESS: Today 01/18/19  HISTORY  Jeffrey Campbell is a 85 year old male, seen in request by his primary care physician Dr. Dr. [name] for evaluation of memory loss, he is accompanied by his son Jeffrey Campbell today's clinical visit.  I have reviewed and summarized the referring note from the referring physician.  He has past medical history of hypertension, hyperlipidemia, coronary artery disease, chronic kidney disease, prediabetes, previous alcohol use, presented with gradual onset memory loss has few years  He retired from [employer], he was noted to have gradual onset memory loss over the past couple years, he got lost while driving at the beginning of the year, he still reads newspaper at home, significant gait abnormality, today's Mini-Mental Status Examination 17 out of 30  I personally reviewed September 02, 2018: mild generalized atrophy, small vessel disease, no acute abnormalities.  Laboratory evaluations on September 07, 2018, ferritin was elevated 524, ammonia 26, CMP showed creatinine of 3.37, GFR of 16, CBC showed hemoglobin of 10.5, platelet of 126, vitamin B12 level was mildly elevated at 1095  Update January 18, 2019 SS: Jeffrey Campbell is an 85 year old male here for follow-up for memory disturbance.  He is here today with his nephew, Jeffrey Campbell.  He feels his memory is stable.  He continues to live with his wife, he does not drive because of issues with his vision.  He is able to perform his own ADLs, he reports a good appetite.  During the day, he admits he is not very active, enjoys watching TV or taking naps.  He used to enjoy working in his lawn, but he says he is not able to do that anymore.  His wife administers his medications.  He remains on Aricept and Namenda.  He indicates he sleeps well at night.  He has not had any falls.  He does have a dialysis graft to his  left arm, but is not sure he will be undergoing dialysis or not.  He presents today for follow-up.  REVIEW OF SYSTEMS: Out of a complete 14 system review of symptoms, the patient complains only of the following symptoms, and all other reviewed systems are negative.  Memory loss  ALLERGIES: Allergies  Allergen Reactions   Iodinated Diagnostic Agents Other (See Comments)    Right heart cath 07/2016 allegedly "shut down his kidneys" Patient has stage III chronic kidney disease   Zocor [Simvastatin] Other (See Comments)    Liver problems    Budesonide-Formoterol Fumarate Other (See Comments)    Dry mouth   Minoxidil Other (See Comments)   Tiotropium Bromide Monohydrate Other (See Comments)    Dry mouth   Tricor [Fenofibrate]          HOME MEDICATIONS: Outpatient Medications Prior to Visit  Medication Sig Dispense Refill   amLODipine (NORVASC) 10 MG tablet Take 10 mg by mouth daily.     apixaban (ELIQUIS) 2.5 MG TABS tablet Take 1 tablet (2.5 mg total) by mouth 2 (two) times daily. 60 tablet 6   atorvastatin (LIPITOR) 20 MG tablet TAKE 1 TABLET BY MOUTH EVERY DAY (Patient taking differently: Take 20 mg by mouth daily. ) 90 tablet 2   Brinzolamide-Brimonidine (SIMBRINZA) 1-0.2 % SUSP Place 1 drop into both eyes 2 (two) times daily.      calcitRIOL (ROCALTROL) 0.25 MCG capsule Take 0.25 mcg by mouth daily.     donepezil (ARICEPT)  10 MG tablet Take 1 tablet (10 mg total) by mouth at bedtime. 30 tablet 11   doxazosin (CARDURA) 4 MG tablet Take 1 tablet (4 mg total) by mouth daily. 90 tablet 2   Epoetin Alfa (PROCRIT IJ) Inject 5,000 Units as directed every 7 (seven) days.     furosemide (LASIX) 80 MG tablet Take 1 tablet (80 mg total) by mouth every morning AND 0.5 tablets (40 mg total) every evening. 150 tablet 3   hydrALAZINE (APRESOLINE) 100 MG tablet Take 1 tablet (100 mg total) by mouth 3 (three) times daily. Please make yearly appt with Dr. Radford Pax for October. 1st  attempt (Patient taking differently: Take 100 mg by mouth 3 (three) times daily. ) 90 tablet 1   levothyroxine (SYNTHROID, LEVOTHROID) 150 MCG tablet Take 150 mcg by mouth daily before breakfast.     losartan (COZAAR) 50 MG tablet Take 50 mg by mouth daily.     memantine (NAMENDA) 10 MG tablet TAKE 1 TABLET BY MOUTH TWICE A DAY 180 tablet 3   QUEtiapine (SEROQUEL) 25 MG tablet Take 0.5 tablets (12.5 mg total) by mouth at bedtime. 15 tablet 0   No facility-administered medications prior to visit.     PAST MEDICAL HISTORY: Past Medical History:  Diagnosis Date   Anemia    low iron   BPH (benign prostatic hyperplasia)    CAD (coronary artery disease)    Carotid artery stenosis    1-39% bilateral carotid artery stenosis and < 50% stenosis in the right CCA by dopplers 06/2017   CKD (chronic kidney disease), stage III    Stage 4   Diastolic dysfunction    Glaucoma    Graves disease    Heart murmur, systolic    History of ETOH abuse    History of PFTs 05/2010   moderate airflow obstruction w reduced DLCO by PFT    Hyperlipemia    Hypertension    Hyperthyroidism 08/26/10   radioactive iodine therapy    Memory loss    Mitral regurgitation echo 2015   mild   Multiple thyroid nodules    MVP (mitral valve prolapse) 11/2012   posterior MVP   Organic impotence    OSA (obstructive sleep apnea)    upper airway resistance syndrome with RDI 18/hr - not on CPAP due to insurance not covering   Pre-diabetes    PUD (peptic ulcer disease)    Pulmonary hypertension ([place]) echo 2015   Group 2 with pulmonary venous HTN and Group 3 with OSA   Shutdown renal    after cardiac cath   Upper airway resistance syndrome     PAST SURGICAL HISTORY: Past Surgical History:  Procedure Laterality Date   APPENDECTOMY     AV FISTULA PLACEMENT Left 10/18/2017   Procedure: ARTERIOVENOUS (AV) FISTULA CREATION ARM;  Surgeon: Waynetta Sandy, MD;  Location: [facility];  Service:  Vascular;  Laterality: Left;   CARDIAC CATHETERIZATION     CARDIAC CATHETERIZATION N/A 02/17/2015   Procedure: Right Heart Cath;  Surgeon: Larey Dresser, MD;  Location: [facility];  Service: Cardiovascular;  Laterality: N/A;   ESOPHAGOGASTRODUODENOSCOPY (EGD) WITH PROPOFOL Left 10/16/2016   Procedure: ESOPHAGOGASTRODUODENOSCOPY (EGD) WITH PROPOFOL;  Surgeon: Ronnette Juniper, MD;  Location: [facility];  Service: Gastroenterology;  Laterality: Left;   heart catherization     HERNIA REPAIR  10/2009   IR RADIOLOGIST EVAL & MGMT  09/28/2016   IR RADIOLOGIST EVAL & MGMT  10/19/2016   IR RADIOLOGIST EVAL &  MGMT  01/18/2017   RIGHT HEART CATH N/A 07/28/2016   Procedure: Right Heart Cath;  Surgeon: Larey Dresser, MD;  Location: [facility];  Service: Cardiovascular;  Laterality: N/A;    FAMILY HISTORY: Family History  Problem Relation Age of Onset   Kidney failure Father    Hypertension Father    Colon cancer Mother    Colon cancer Brother    Lung disease Brother    Colon cancer Maternal Uncle     SOCIAL HISTORY: Social History   Socioeconomic History   Marital status: Married    Spouse name: Not on file   Number of children: 4   Years of education: completed high school   Highest education level: Not on file  Occupational History   Occupation: Retired    Fish farm manager: RETIRED    Comment: [employer]  Social Needs   Financial resource strain: Not on file   Food insecurity    Worry: Not on file    Inability: Not on Lexicographer needs    Medical: Not on file    Non-medical: Not on file  Tobacco Use   Smoking status: Former Smoker    Packs/day: 2.00    Years: 40.00    Pack years: 80.00    Types: Cigarettes    Quit date: 03/29/1984    Years since quitting: 34.8   Smokeless tobacco: Never Used  Substance and Sexual Activity   Alcohol use: No    Comment: quit in 1981   Drug use: No   Sexual activity: Not on file  Lifestyle     Physical activity    Days per week: Not on file    Minutes per session: Not on file   Stress: Not on file  Relationships   Social connections    Talks on phone: Not on file    Gets together: Not on file    Attends religious service: Not on file    Active member of club or organization: Not on file    Attends meetings of clubs or organizations: Not on file    Relationship status: Not on file   Intimate partner violence    Fear of current or ex partner: Not on file    Emotionally abused: Not on file    Physically abused: Not on file    Forced sexual activity: Not on file  Other Topics Concern   Not on file  Social History Narrative   Lives at home with his wife.   Right-handed.   Occasional use of caffeine.    PHYSICAL EXAM  Vitals:   01/18/19 1027 01/18/19 1042  BP: (!) 184/59 (!) 200/66  Pulse: (!) 41   Temp: 97.8 F (36.6 C)   Weight: 149 lb 6.4 oz (67.8 kg)   Height: 5\' 8"  (1.727 m)    Body mass index is 22.72 kg/m.  Generalized: Well developed, in no acute distress  MMSE - Mini Mental State Exam 01/18/2019 09/18/2018  Orientation to time 2 1  Orientation to Place 4 5  Registration 3 3  Attention/ Calculation 0 0  Recall 1 0  Language- name 2 objects 2 2  Language- repeat 1 1  Language- follow 3 step command 3 3  Language- read & follow direction 1 1  Write a sentence 1 1  Copy design 1 0  Total score 19 17    Neurological examination  Mentation: Alert oriented to time, place, history taking. Follows all commands speech  and language fluent Cranial nerve II-XII: Pupils were equal round reactive to light. Extraocular movements were full, visual field were full on confrontational test. Facial sensation and strength were normal. Head turning and shoulder shrug  were normal and symmetric. Motor: The motor testing reveals 5 over 5 strength of all 4 extremities. Good symmetric motor tone is noted throughout.  Sensory: Sensory testing is intact to soft  touch on all 4 extremities. No evidence of extinction is noted.  Coordination: Cerebellar testing reveals good finger-nose-finger and heel-to-shin bilaterally.  Gait and station: Has to push off from seated position to stand, mildly leaning forward gait, no assistive device. Reflexes: Deep tendon reflexes are symmetric and normal bilaterally.   DIAGNOSTIC DATA (LABS, IMAGING, TESTING) - I reviewed patient records, labs, notes, testing and imaging myself where available.  Lab Results  Component Value Date   WBC 5.2 09/03/2018   HGB 10.0 (L) 01/11/2019   HCT 32.0 (L) 09/07/2018   MCV 97.3 09/03/2018   PLT 126 (L) 09/03/2018      Component Value Date/Time   NA 144 01/10/2019 0900   K 4.8 01/10/2019 0900   CL 107 (H) 01/10/2019 0900   CO2 26 01/10/2019 0900   GLUCOSE 89 01/10/2019 0900   GLUCOSE 132 (H) 09/07/2018 0914   BUN 84 (HH) 01/10/2019 0900   CREATININE 4.08 (H) 01/10/2019 0900   CREATININE 3.10 (H) 01/13/2017 0920   CALCIUM 10.1 01/10/2019 0900   PROT 6.4 (L) 09/03/2018 0430   PROT 7.3 12/21/2016 0914   ALBUMIN 3.5 09/03/2018 0430   ALBUMIN 4.5 12/21/2016 0914   AST 26 09/03/2018 0430   ALT 43 09/03/2018 0430   ALKPHOS 61 09/03/2018 0430   BILITOT 1.3 (H) 09/03/2018 0430   BILITOT 0.9 12/21/2016 0914   GFRNONAA 13 (L) 01/10/2019 0900   GFRNONAA 18 (L) 01/13/2017 0920   GFRAA 15 (L) 01/10/2019 0900   GFRAA 21 (L) 01/13/2017 0920   Lab Results  Component Value Date   CHOL 117 06/27/2017   HDL 60 06/27/2017   LDLCALC 48 06/27/2017   TRIG 45 06/27/2017   CHOLHDL 2.0 06/27/2017   Lab Results  Component Value Date   HGBA1C 5.5 09/19/2016   Lab Results  Component Value Date   VITAMINB12 1,095 (H) 09/02/2018   Lab Results  Component Value Date   TSH 0.183 (L) 09/02/2018    ASSESSMENT AND PLAN 85 y.o. year old male  has a past medical history of Anemia, BPH (benign prostatic hyperplasia), CAD (coronary artery disease), Carotid artery stenosis, CKD (chronic  kidney disease), stage III, Diastolic dysfunction, Glaucoma, Graves disease, Heart murmur, systolic, History of ETOH abuse, History of PFTs (05/2010), Hyperlipemia, Hypertension, Hyperthyroidism (08/26/10), Memory loss, Mitral regurgitation (echo 2015), Multiple thyroid nodules, MVP (mitral valve prolapse) (11/2012), Organic impotence, OSA (obstructive sleep apnea), Pre-diabetes, PUD (peptic ulcer disease), Pulmonary hypertension ([place]) (echo 2015), Shutdown renal, and Upper airway resistance syndrome. here with:  1.  Dementia -MRI of the brain showed generalized atrophy, periventricular small vessel disease -Laboratory evaluation showed no treatable cause -MMSE was stable today 19/30 -Continue Namenda 10 mg twice a day -Continue Aricept 10 mg daily -Encouraged exercise as tolerated -Blood pressure elevated today, in the setting of missing morning medications, chronic low heart rate of 44 today per recent cardiology note, is not symptomatic, he has chronic kidney disease, dialysis graft in place, has not yet started hemodialysis -Follow-up in 6 months or sooner if needed  I spent 15 minutes with the patient. 50% of  this time was spent the plan of care.  Butler Denmark, AGNP-C, DNP 01/18/2019, 10:51 AM [facility] [address] [place], [place] [address] [phone]

## 2019-01-18 NOTE — Patient Instructions (Signed)
Please continue Namenda and Aricept for memory. The memory score is stable today was 19/30, was 17/30 in June 2020. BP was elevated today, but he did not take medications. Please ensure he takes his medications at home and monitor his BP. Return in 6 months. Continue follow-up with primary doctor. You do not need any refills at this time.

## 2019-01-22 DIAGNOSIS — J449 Chronic obstructive pulmonary disease, unspecified: Secondary | ICD-10-CM | POA: Diagnosis not present

## 2019-01-22 DIAGNOSIS — N185 Chronic kidney disease, stage 5: Secondary | ICD-10-CM | POA: Diagnosis not present

## 2019-01-22 DIAGNOSIS — I2510 Atherosclerotic heart disease of native coronary artery without angina pectoris: Secondary | ICD-10-CM | POA: Diagnosis not present

## 2019-01-22 DIAGNOSIS — I272 Pulmonary hypertension, unspecified: Secondary | ICD-10-CM | POA: Diagnosis not present

## 2019-01-22 DIAGNOSIS — I120 Hypertensive chronic kidney disease with stage 5 chronic kidney disease or end stage renal disease: Secondary | ICD-10-CM | POA: Diagnosis not present

## 2019-01-22 DIAGNOSIS — D631 Anemia in chronic kidney disease: Secondary | ICD-10-CM | POA: Diagnosis not present

## 2019-01-22 DIAGNOSIS — N2581 Secondary hyperparathyroidism of renal origin: Secondary | ICD-10-CM | POA: Diagnosis not present

## 2019-01-22 DIAGNOSIS — I5030 Unspecified diastolic (congestive) heart failure: Secondary | ICD-10-CM | POA: Diagnosis not present

## 2019-01-22 DIAGNOSIS — N189 Chronic kidney disease, unspecified: Secondary | ICD-10-CM | POA: Diagnosis not present

## 2019-01-23 ENCOUNTER — Ambulatory Visit (HOSPITAL_COMMUNITY)
Admission: RE | Admit: 2019-01-23 | Discharge: 2019-01-23 | Disposition: A | Discharge status: Home / Self Care | Payer: Medicare Other | Source: Ambulatory Visit | Attending: Cardiology | Admitting: Cardiology

## 2019-01-23 ENCOUNTER — Other Ambulatory Visit: Payer: Self-pay

## 2019-01-23 ENCOUNTER — Encounter (HOSPITAL_COMMUNITY)
Admission: RE | Admit: 2019-01-23 | Discharge: 2019-01-23 | Disposition: A | Discharge status: Home / Self Care | Payer: Medicare Other | Source: Ambulatory Visit | Attending: Cardiology | Admitting: Cardiology

## 2019-01-23 ENCOUNTER — Encounter (HOSPITAL_COMMUNITY): Payer: Medicare Other | Admitting: Cardiology

## 2019-01-23 DIAGNOSIS — I5032 Chronic diastolic (congestive) heart failure: Secondary | ICD-10-CM | POA: Diagnosis not present

## 2019-01-23 DIAGNOSIS — I509 Heart failure, unspecified: Secondary | ICD-10-CM | POA: Diagnosis not present

## 2019-01-23 MED ORDER — TECHNETIUM TC 99M PYROPHOSPHATE
21.0000 | Freq: Once | INTRAVENOUS | Status: AC | PRN
  Administered 2019-01-23: 11:00:00 21 via INTRAVENOUS
  Filled 2019-01-23: qty 21

## 2019-01-25 ENCOUNTER — Other Ambulatory Visit: Payer: Medicare Other | Admitting: Internal Medicine

## 2019-01-25 ENCOUNTER — Ambulatory Visit (HOSPITAL_BASED_OUTPATIENT_CLINIC_OR_DEPARTMENT_OTHER)
Admission: RE | Admit: 2019-01-25 | Discharge: 2019-01-25 | Disposition: A | Discharge status: Home / Self Care | Payer: Medicare Other | Source: Ambulatory Visit | Attending: Cardiology | Admitting: Cardiology

## 2019-01-25 ENCOUNTER — Ambulatory Visit (HOSPITAL_COMMUNITY)
Admission: RE | Admit: 2019-01-25 | Discharge: 2019-01-25 | Disposition: A | Discharge status: Home / Self Care | Payer: Medicare Other | Source: Ambulatory Visit | Attending: Nephrology | Admitting: Nephrology

## 2019-01-25 ENCOUNTER — Other Ambulatory Visit: Payer: Self-pay

## 2019-01-25 ENCOUNTER — Encounter (HOSPITAL_COMMUNITY): Payer: Self-pay | Admitting: Cardiology

## 2019-01-25 VITALS — BP 144/66 | HR 41 | Wt 153.6 lb

## 2019-01-25 VITALS — BP 175/56 | HR 40 | Temp 96.3°F | Resp 20

## 2019-01-25 DIAGNOSIS — I483 Typical atrial flutter: Secondary | ICD-10-CM

## 2019-01-25 DIAGNOSIS — I5032 Chronic diastolic (congestive) heart failure: Secondary | ICD-10-CM | POA: Insufficient documentation

## 2019-01-25 DIAGNOSIS — I130 Hypertensive heart and chronic kidney disease with heart failure and stage 1 through stage 4 chronic kidney disease, or unspecified chronic kidney disease: Secondary | ICD-10-CM | POA: Diagnosis not present

## 2019-01-25 DIAGNOSIS — N184 Chronic kidney disease, stage 4 (severe): Secondary | ICD-10-CM | POA: Insufficient documentation

## 2019-01-25 LAB — BASIC METABOLIC PANEL
Anion gap: 11 (ref 5–15)
BUN: 79 mg/dL — ABNORMAL HIGH (ref 8–23)
CO2: 23 mmol/L (ref 22–32)
Calcium: 9.8 mg/dL (ref 8.9–10.3)
Chloride: 108 mmol/L (ref 98–111)
Creatinine, Ser: 4.28 mg/dL — ABNORMAL HIGH (ref 0.61–1.24)
GFR calc Af Amer: 14 mL/min — ABNORMAL LOW (ref >60)
GFR calc non Af Amer: 12 mL/min — ABNORMAL LOW (ref >60)
Glucose, Bld: 111 mg/dL — ABNORMAL HIGH (ref 70–99)
Potassium: 4.3 mmol/L (ref 3.5–5.1)
Sodium: 142 mmol/L (ref 135–145)

## 2019-01-25 LAB — POCT HEMOGLOBIN-HEMACUE: Hemoglobin: 9.6 g/dL — ABNORMAL LOW (ref 13.0–17.0)

## 2019-01-25 MED ORDER — EPOETIN ALFA 10000 UNIT/ML IJ SOLN
INTRAMUSCULAR | Status: AC
  Administered 2019-01-25: 10000 [IU] via SUBCUTANEOUS
  Filled 2019-01-25: qty 1

## 2019-01-25 MED ORDER — EPOETIN ALFA 10000 UNIT/ML IJ SOLN
10000.0000 [IU] | INTRAMUSCULAR | Status: DC
  Administered 2019-01-25: 09:00:00 10000 [IU] via SUBCUTANEOUS

## 2019-01-25 MED ORDER — TORSEMIDE 20 MG PO TABS: 80.0000 mg | ORAL_TABLET | Freq: Every day | ORAL | 3 refills | Status: DC

## 2019-01-25 NOTE — Patient Instructions (Signed)
Labs were done today. We will only call you if there are any Abnormalities. No Call Is A Good Call!!   Medication Change: STOP Lasix                                   START Tosemide 80mg  ( 4 tablets ) Daily.   Your Provider would like for you to pick up some compression stockings. Please wear them Daily.  Your physician has recommended that you have a Cardioversion (DCCV). Electrical Cardioversion uses a jolt of electricity to your heart either through paddles or wired patches attached to your chest. This is a controlled, usually prescheduled, procedure. Defibrillation is done under light anesthesia in the hospital, and you usually go home the day of the procedure. This is done to get your heart back into a normal rhythm. You are not awake for the procedure. Please see the instruction sheet given to you today.  Your Provider would like for you to Follow up in 1 month in the Clinic.  At the [facility], you and your health needs are our priority. As part of our continuing mission to provide you with exceptional heart care, we have created designated Provider Care Teams. These Care Teams include your primary Cardiologist (physician) and Advanced Practice Providers (APPs- Physician Assistants and Nurse Practitioners) who all work together to provide you with the care you need, when you need it.   You may see any of the following providers on your designated Care Team at your next follow up: Marland Kitchen Dr Glori Bickers . Dr Loralie Champagne . Darrick Grinder, NP . Lyda Jester, PA   Please be sure to bring in all your medications bottles to every appointment.

## 2019-01-25 NOTE — H&P (View-Only) (Signed)
Patient ID: NTHONY LEFFERTS, male   DOB: 04-06-1932, 85 y.o.   MRN: [id number] PCP: Dr. Drema [place] Cardiology: Dr Radford Pax HF Cardiology: Dr. Aundra Dubin  85 y.o. with history of CAD, PAD, CKD, sinus bradycardia, and pulmonary HTN by echo presents for followup of CHF.  Patient had an echo in 10/16 showing normal LV EF, but moderately dilated RV with PASP 68 mmHg.  He had an extensive workup for pulmonary hypertension. PFTs showed mild obstruction but moderate to severely decreased DLCO.  V/Q scan was negative for evidence of acute or chronic PE.  He had had a CT chest back in 2/16 that showed no definitive evidence for interstitial lung disease.  [place] in 11/16 showed pulmonary venous hypertension and Lasix was started at 20 mg daily.   Patient is bradycardic with HR in 40s.  This seems to be asymptomatic and is chronic. HR 41 today.   He had rather diffuse moderate CAD on LHC in 2012 but no intervention.  Cardiolite in 4/16 showed no ischemia or infarction.  He has bilateral renal artery stenosis by renal artery dopplers in 10/16.  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  No claudication or intestinal angina.  It has been decided not to intervene on his renal arteries.   He had repeat echo in 28/76 with PA systolic pressure 77 mmHg, normal EF and evidence for worsening diastolic function.  RHC was done in 5/18, again showing primarily pulmonary venous hypertension.  Echo in 6/18 showed PASP 46 mmHg with EF 60-65%.  Most recent echo in 10/20 showed EF 55-60%, moderately dilated and dysfunctional RV, PASP 72, dilated IVC, severe biatrial enlargement.   Patient was referred back by Dr. Radford Pax for re-evaluation of CHF/RV failure and pulmonary hypertension.  At last appointment, I had him increase his Lasix.  He still has very significant lower extremity edema.  He denies dyspnea walking on flat ground and walked into the office without problems today.  No lightheadedness/syncope despite  low heart rate.  He remains in atrial flutter today.  He is taking Aricept and Namenda for dementia.   ECG (personally reviewed): atrial flutter rate 41.   Labs (4/16): LDL 70 Labs (10/16): BNP 621, K 3.9, creatinine 2.51 Labs (11/16): K 4.2, creatinine 2.49 Labs (7/18): K 3.7, creatinine 2.82, hgb 7.5 Labs (10/20): K 4.8, creatinine 4.08, hgb 10, NT-proBNP 21225  PMH: 1. CAD: LHC (2012) with 50-60% ostial LAD, 50-60% mLAD, 60% pRCA.  Cardiolite (4/16) with EF 58%, no ischemia or infarction.  2. [place] 3. HTN 4. Hyperlipidemia 5. Graves disease treated with RAI, now hypothyroid. 6. PUD 7. CKD III-IV 8. Type II diabetes 9. OSA: Not using CPAP 10. Sinus bradycardia: Holter (10/16) with average HR 44, occasional junctional rhythm.  11. Mitral valve prolapse: Moderate MR on 12/17 echo, minimal reported on 6/18 echo.  12. Chronic diastolic CHF: Echo (81/15) with EF 60-65%, grade II diastolic dysfunction, mild AI, MVP with mild MR, moderate RV dilation with normal systolic function, PASP 68 mmHg.   - Echo (12/17): EF 60-65%, moderate MR, grade III diastolic dysfunction, PASP 77 mmHg. - Echo (6/18): EF 60-65%, PASP 46 mmHg.  - Echo (10/20): EF 55-60%, mild LV, moderately decreased RV systolic function, moderate RV dilation, severe biatrial enlargement, dilated IVC, PASP 72 mmHg.  - PYP scan (10/20): grade 1, H/CL ratio 1.17; no evidence for ATTR amyloidosis.  13. PAD: Abdominal US (10/16) with > 50% distal aorta stenosis, > 70% SMA and celiac artery stenoses, >  60% bilateral renal artery stenosis.  14. Pulmonary hypertension: PASP 68 mmHg on 10/16 echo.  PFTs (11/16) with FEV1 76%, FVC 78%, ratio 95%, TLC 73%, DLCO 48% => mild obstruction with moderate to severely decreased DLCO.  CT chest (2/16) without definitive interstitial lung disease.  V/Q scan 11/16 with no perfusion defects.  RHC (11/16) with mean RA 5 (v-waves to 18), PA 45/9 mean 24, PCWP mean 13 (v-waves to 33), CI 3.42, PVR 1.8 WU.   - RHC (5/18): mean RA 8, PA 68/12 mean 33, mean PCWP 21, CI 3.91, PVR 1.74 WU 15. Carotid stenosis: Carotid dopplers (10/16) with mild disease.  - Carotid dopplers (2/20): 50-69% BICA stenosis.  16. Dementia 17. COPD: Mild 18. Atrial flutter: Noted in 10/20.   SH: Married, lives in [place], retired Scientist, research (medical), heavy smoker previously but quit in 1980s.    FH: HTN, CKD.  ROS: All systems reviewed and negative except as per HPI.   Current Outpatient Medications  Medication Sig Dispense Refill  . amLODipine (NORVASC) 10 MG tablet Take 10 mg by mouth daily.    Marland Kitchen apixaban (ELIQUIS) 2.5 MG TABS tablet Take 1 tablet (2.5 mg total) by mouth 2 (two) times daily. 60 tablet 6  . atorvastatin (LIPITOR) 20 MG tablet TAKE 1 TABLET BY MOUTH EVERY DAY (Patient taking differently: Take 20 mg by mouth daily. ) 90 tablet 2  . Brinzolamide-Brimonidine (SIMBRINZA) 1-0.2 % SUSP Place 1 drop into both eyes 2 (two) times daily.     . calcitRIOL (ROCALTROL) 0.25 MCG capsule Take 0.25 mcg by mouth daily.    Marland Kitchen donepezil (ARICEPT) 10 MG tablet Take 1 tablet (10 mg total) by mouth at bedtime. 30 tablet 11  . doxazosin (CARDURA) 4 MG tablet Take 1 tablet (4 mg total) by mouth daily. 90 tablet 2  . Epoetin Alfa (PROCRIT IJ) Inject 5,000 Units as directed every 7 (seven) days.    . hydrALAZINE (APRESOLINE) 100 MG tablet Take 1 tablet (100 mg total) by mouth 3 (three) times daily. Please make yearly appt with Dr. Radford Pax for October. 1st attempt (Patient taking differently: Take 100 mg by mouth 3 (three) times daily. ) 90 tablet 1  . levothyroxine (SYNTHROID, LEVOTHROID) 150 MCG tablet Take 150 mcg by mouth daily before breakfast.    . losartan (COZAAR) 50 MG tablet Take 50 mg by mouth daily.    . memantine (NAMENDA) 10 MG tablet TAKE 1 TABLET BY MOUTH TWICE A DAY 180 tablet 3  . torsemide (DEMADEX) 20 MG tablet Take 4 tablets (80 mg total) by mouth daily. 120 tablet 3   No current facility-administered  medications for this encounter.    Facility-Administered Medications Ordered in Other Encounters  Medication Dose Route Frequency Provider Last Rate Last Dose  . epoetin alfa (EPOGEN) injection 10,000 Units  10,000 Units Subcutaneous Q14 Days [name], MD   10,000 Units at 01/25/19 0849   BP (!) 144/66   Pulse (!) 41   Wt 69.7 kg (153 lb 9.6 oz)   SpO2 98%   BMI 23.35 kg/m   General: NAD Neck: JVP 10-12 cm, no thyromegaly or thyroid nodule.  Lungs: Clear to auscultation bilaterally with normal respiratory effort. CV: Nondisplaced PMI.  Heart regular S1/S2, no S3/S4, 2/6 SEM RUSB.  2+ edema to knees.  No carotid bruit. Unable to palpate pedal pulses.  Abdomen: Soft, nontender, no hepatosplenomegaly, no distention.  Skin: Intact without lesions or rashes.  Neurologic: Alert and oriented x 3.  Psych: Normal  affect. Extremities: No clubbing or cyanosis.  HEENT: Normal. ]  Assessment/Plan: 1. Pulmonary hypertension: Noted by echo in 2016 with PASP 68 mmHg and moderately dilated RV.  PFTs showed mild obstruction with moderate to severely decreased DLCO, suggesting a pulmonary vascular problem.  CT chest in 2/16 did not show definitive ILD.  V/Q scan did not show evidence for acute or chronic PE.  He carries a history of OSA but is not using CPAP.  RHC showed mild pulmonary venous hypertension, PVR was only 1.8 WU.   He had echo in 12/17 with PASP 77 mmHg but RHC again in 5/18 showed pulmonary venous hypertension.  Suspect he has significant diastolic CHF.  Most recent echo in 10/20 showed PASP 72 mmHg.   - Treatment will be diuresis, would avoid selective pulmonary vasodilators given primarily pulmonary venous hypertension.   2. Chronic diastolic CHF: With prominent RV failure.  Patient has diastolic LV failure with pulmonary venous HTN on prior RHC.  CKD stage IV increases tendency to retain fluid.  PYP scan in 10/20 was not suggestive of ATTR amyloidosis.  On exam today, he remains  significantly volume overloaded.  Conversion to atrial flutter may have worsened volume retention. 0 - Stop Lasix, start torsemide 80 mg daily.  BMET today and again in 1 week.    - Wear compression stockings during the day.  3. CKD: Stage IV.  Closely followed by Dr. Justin Mend with the thought that he may need HD soon.  Recent creatinine up to 4.   4. HTN: Suspect bilateral renal artery stenosis plays a role.  BP now controlled.  - Based on review of notes it appears that intervention on renal arteries has been decided against.  5. PAD: Renal artery stenosis, mesenteric artery stenosis, distal aortic stenosis. - Continue statin.  6. CAD: Stable with no chest pain.  Continue statin. Goal LDL < 70.  4/16 Cardiolite with no ischemia. No ASA given need for anticoagulation.  7. Sinus bradycardia:  Has been chronic and asymptomatic.  Today, he is in atrial flutter with slow response.  The only potential HR-affecting drug he is on now is Aricept.  - I will send a message to his neurologist about Aricept.  He is also on Namenda.  Could consider stopping the Aricept with low HR to see if it helps increase HR, but would continue if it seems to be helping.  8. Atrial flutter: Slow response, HR 40s.  He was in atrial flutter at last appointment.  He does not feel palpitations.  It is possible that atrial flutter could have worsened his HF.   - Continue apixaban 2.5 mg bid (reduced dosing based on age and creatinine).   - I will arrange for DCCV after 4 wks of Eliquis (2 more weeks) to see if NSR helps control his CHF. We discussed risks/benefits and he agrees to procedure.   Followup 1 month.   Loralie Champagne 01/25/2019

## 2019-01-25 NOTE — Progress Notes (Signed)
Patient ID: Jeffrey Campbell, male   DOB: Aug 29, 1932, 85 y.o.   MRN: [id number] PCP: Dr. Drema Dallas Cardiology: Dr Radford Pax HF Cardiology: Dr. Aundra Dubin  85 y.o. with history of CAD, PAD, CKD, sinus bradycardia, and pulmonary HTN by echo presents for followup of CHF.  Patient had an echo in 10/16 showing normal LV EF, but moderately dilated RV with PASP 68 mmHg.  He had an extensive workup for pulmonary hypertension. PFTs showed mild obstruction but moderate to severely decreased DLCO.  V/Q scan was negative for evidence of acute or chronic PE.  He had had a CT chest back in 2/16 that showed no definitive evidence for interstitial lung disease.  [place] in 11/16 showed pulmonary venous hypertension and Lasix was started at 20 mg daily.   Patient is bradycardic with HR in 40s.  This seems to be asymptomatic and is chronic. HR 41 today.   He had rather diffuse moderate CAD on LHC in 2012 but no intervention.  Cardiolite in 4/16 showed no ischemia or infarction.  He has bilateral renal artery stenosis by renal artery dopplers in 10/16.  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  No claudication or intestinal angina.  It has been decided not to intervene on his renal arteries.   He had repeat echo in 94/49 with PA systolic pressure 77 mmHg, normal EF and evidence for worsening diastolic function.  RHC was done in 5/18, again showing primarily pulmonary venous hypertension.  Echo in 6/18 showed PASP 46 mmHg with EF 60-65%.  Most recent echo in 10/20 showed EF 55-60%, moderately dilated and dysfunctional RV, PASP 72, dilated IVC, severe biatrial enlargement.   Patient was referred back by Dr. Radford Pax for re-evaluation of CHF/RV failure and pulmonary hypertension.  At last appointment, I had him increase his Lasix.  He still has very significant lower extremity edema.  He denies dyspnea walking on flat ground and walked into the office without problems today.  No lightheadedness/syncope despite  low heart rate.  He remains in atrial flutter today.  He is taking Aricept and Namenda for dementia.   ECG (personally reviewed): atrial flutter rate 41.   Labs (4/16): LDL 70 Labs (10/16): BNP 621, K 3.9, creatinine 2.51 Labs (11/16): K 4.2, creatinine 2.49 Labs (7/18): K 3.7, creatinine 2.82, hgb 7.5 Labs (10/20): K 4.8, creatinine 4.08, hgb 10, NT-proBNP 21225  PMH: 1. CAD: LHC (2012) with 50-60% ostial LAD, 50-60% mLAD, 60% pRCA.  Cardiolite (4/16) with EF 58%, no ischemia or infarction.  2. [place] 3. HTN 4. Hyperlipidemia 5. Graves disease treated with RAI, now hypothyroid. 6. PUD 7. CKD III-IV 8. Type II diabetes 9. OSA: Not using CPAP 10. Sinus bradycardia: Holter (10/16) with average HR 44, occasional junctional rhythm.  11. Mitral valve prolapse: Moderate MR on 12/17 echo, minimal reported on 6/18 echo.  12. Chronic diastolic CHF: Echo (67/59) with EF 60-65%, grade II diastolic dysfunction, mild AI, MVP with mild MR, moderate RV dilation with normal systolic function, PASP 68 mmHg.   - Echo (12/17): EF 60-65%, moderate MR, grade III diastolic dysfunction, PASP 77 mmHg. - Echo (6/18): EF 60-65%, PASP 46 mmHg.  - Echo (10/20): EF 55-60%, mild LV, moderately decreased RV systolic function, moderate RV dilation, severe biatrial enlargement, dilated IVC, PASP 72 mmHg.  - PYP scan (10/20): grade 1, H/CL ratio 1.17; no evidence for ATTR amyloidosis.  13. PAD: Abdominal US (10/16) with > 50% distal aorta stenosis, > 70% SMA and celiac artery stenoses, >  60% bilateral renal artery stenosis.  14. Pulmonary hypertension: PASP 68 mmHg on 10/16 echo.  PFTs (11/16) with FEV1 76%, FVC 78%, ratio 95%, TLC 73%, DLCO 48% => mild obstruction with moderate to severely decreased DLCO.  CT chest (2/16) without definitive interstitial lung disease.  V/Q scan 11/16 with no perfusion defects.  RHC (11/16) with mean RA 5 (v-waves to 18), PA 45/9 mean 24, PCWP mean 13 (v-waves to 33), CI 3.42, PVR 1.8 WU.   - RHC (5/18): mean RA 8, PA 68/12 mean 33, mean PCWP 21, CI 3.91, PVR 1.74 WU 15. Carotid stenosis: Carotid dopplers (10/16) with mild disease.  - Carotid dopplers (2/20): 50-69% BICA stenosis.  16. Dementia 17. COPD: Mild 18. Atrial flutter: Noted in 10/20.   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    FH: HTN, CKD.  ROS: All systems reviewed and negative except as per HPI.   Current Outpatient Medications  Medication Sig Dispense Refill  . amLODipine (NORVASC) 10 MG tablet Take 10 mg by mouth daily.    Marland Kitchen apixaban (ELIQUIS) 2.5 MG TABS tablet Take 1 tablet (2.5 mg total) by mouth 2 (two) times daily. 60 tablet 6  . atorvastatin (LIPITOR) 20 MG tablet TAKE 1 TABLET BY MOUTH EVERY DAY (Patient taking differently: Take 20 mg by mouth daily. ) 90 tablet 2  . Brinzolamide-Brimonidine (SIMBRINZA) 1-0.2 % SUSP Place 1 drop into both eyes 2 (two) times daily.     . calcitRIOL (ROCALTROL) 0.25 MCG capsule Take 0.25 mcg by mouth daily.    Marland Kitchen donepezil (ARICEPT) 10 MG tablet Take 1 tablet (10 mg total) by mouth at bedtime. 30 tablet 11  . doxazosin (CARDURA) 4 MG tablet Take 1 tablet (4 mg total) by mouth daily. 90 tablet 2  . Epoetin Alfa (PROCRIT IJ) Inject 5,000 Units as directed every 7 (seven) days.    . hydrALAZINE (APRESOLINE) 100 MG tablet Take 1 tablet (100 mg total) by mouth 3 (three) times daily. Please make yearly appt with Dr. Radford Pax for October. 1st attempt (Patient taking differently: Take 100 mg by mouth 3 (three) times daily. ) 90 tablet 1  . levothyroxine (SYNTHROID, LEVOTHROID) 150 MCG tablet Take 150 mcg by mouth daily before breakfast.    . losartan (COZAAR) 50 MG tablet Take 50 mg by mouth daily.    . memantine (NAMENDA) 10 MG tablet TAKE 1 TABLET BY MOUTH TWICE A DAY 180 tablet 3  . torsemide (DEMADEX) 20 MG tablet Take 4 tablets (80 mg total) by mouth daily. 120 tablet 3   No current facility-administered  medications for this encounter.    Facility-Administered Medications Ordered in Other Encounters  Medication Dose Route Frequency Provider Last Rate Last Dose  . epoetin alfa (EPOGEN) injection 10,000 Units  10,000 Units Subcutaneous Q14 Days [name], MD   10,000 Units at 01/25/19 0849   BP (!) 144/66   Pulse (!) 41   Wt 69.7 kg (153 lb 9.6 oz)   SpO2 98%   BMI 23.35 kg/m   General: NAD Neck: JVP 10-12 cm, no thyromegaly or thyroid nodule.  Lungs: Clear to auscultation bilaterally with normal respiratory effort. CV: Nondisplaced PMI.  Heart regular S1/S2, no S3/S4, 2/6 SEM RUSB.  2+ edema to knees.  No carotid bruit. Unable to palpate pedal pulses.  Abdomen: Soft, nontender, no hepatosplenomegaly, no distention.  Skin: Intact without lesions or rashes.  Neurologic: Alert and oriented x 3.  Psych: Normal  affect. Extremities: No clubbing or cyanosis.  HEENT: Normal. ]  Assessment/Plan: 1. Pulmonary hypertension: Noted by echo in 2016 with PASP 68 mmHg and moderately dilated RV.  PFTs showed mild obstruction with moderate to severely decreased DLCO, suggesting a pulmonary vascular problem.  CT chest in 2/16 did not show definitive ILD.  V/Q scan did not show evidence for acute or chronic PE.  He carries a history of OSA but is not using CPAP.  RHC showed mild pulmonary venous hypertension, PVR was only 1.8 WU.   He had echo in 12/17 with PASP 77 mmHg but RHC again in 5/18 showed pulmonary venous hypertension.  Suspect he has significant diastolic CHF.  Most recent echo in 10/20 showed PASP 72 mmHg.   - Treatment will be diuresis, would avoid selective pulmonary vasodilators given primarily pulmonary venous hypertension.   2. Chronic diastolic CHF: With prominent RV failure.  Patient has diastolic LV failure with pulmonary venous HTN on prior RHC.  CKD stage IV increases tendency to retain fluid.  PYP scan in 10/20 was not suggestive of ATTR amyloidosis.  On exam today, he remains  significantly volume overloaded.  Conversion to atrial flutter may have worsened volume retention. 0 - Stop Lasix, start torsemide 80 mg daily.  BMET today and again in 1 week.    - Wear compression stockings during the day.  3. CKD: Stage IV.  Closely followed by Dr. Justin Mend with the thought that he may need HD soon.  Recent creatinine up to 4.   4. HTN: Suspect bilateral renal artery stenosis plays a role.  BP now controlled.  - Based on review of notes it appears that intervention on renal arteries has been decided against.  5. PAD: Renal artery stenosis, mesenteric artery stenosis, distal aortic stenosis. - Continue statin.  6. CAD: Stable with no chest pain.  Continue statin. Goal LDL < 70.  4/16 Cardiolite with no ischemia. No ASA given need for anticoagulation.  7. Sinus bradycardia:  Has been chronic and asymptomatic.  Today, he is in atrial flutter with slow response.  The only potential HR-affecting drug he is on now is Aricept.  - I will send a message to his neurologist about Aricept.  He is also on Namenda.  Could consider stopping the Aricept with low HR to see if it helps increase HR, but would continue if it seems to be helping.  8. Atrial flutter: Slow response, HR 40s.  He was in atrial flutter at last appointment.  He does not feel palpitations.  It is possible that atrial flutter could have worsened his HF.   - Continue apixaban 2.5 mg bid (reduced dosing based on age and creatinine).   - I will arrange for DCCV after 4 wks of Eliquis (2 more weeks) to see if NSR helps control his CHF. We discussed risks/benefits and he agrees to procedure.   Followup 1 month.   Loralie Champagne 01/25/2019

## 2019-02-01 ENCOUNTER — Other Ambulatory Visit (HOSPITAL_COMMUNITY): Payer: Self-pay | Admitting: *Deleted

## 2019-02-05 ENCOUNTER — Ambulatory Visit (HOSPITAL_COMMUNITY)
Admission: RE | Admit: 2019-02-05 | Discharge: 2019-02-05 | Disposition: A | Discharge status: Home / Self Care | Payer: Medicare Other | Source: Ambulatory Visit | Attending: Cardiology | Admitting: Cardiology

## 2019-02-05 ENCOUNTER — Other Ambulatory Visit: Payer: Self-pay

## 2019-02-05 ENCOUNTER — Other Ambulatory Visit (HOSPITAL_COMMUNITY)
Admission: RE | Admit: 2019-02-05 | Discharge: 2019-02-05 | Disposition: A | Discharge status: Home / Self Care | Payer: Medicare Other | Source: Ambulatory Visit

## 2019-02-05 DIAGNOSIS — Z01812 Encounter for preprocedural laboratory examination: Secondary | ICD-10-CM | POA: Insufficient documentation

## 2019-02-05 DIAGNOSIS — Z20828 Contact with and (suspected) exposure to other viral communicable diseases: Secondary | ICD-10-CM | POA: Insufficient documentation

## 2019-02-05 DIAGNOSIS — I5032 Chronic diastolic (congestive) heart failure: Secondary | ICD-10-CM

## 2019-02-05 LAB — CBC
HCT: 34.6 % — ABNORMAL LOW (ref 39.0–52.0)
Hemoglobin: 10.9 g/dL — ABNORMAL LOW (ref 13.0–17.0)
MCH: 32.2 pg (ref 26.0–34.0)
MCHC: 31.5 g/dL (ref 30.0–36.0)
MCV: 102.4 fL — ABNORMAL HIGH (ref 80.0–100.0)
Platelets: 128 10*3/uL — ABNORMAL LOW (ref 150–400)
RBC: 3.38 MIL/uL — ABNORMAL LOW (ref 4.22–5.81)
RDW: 14.8 % (ref 11.5–15.5)
WBC: 4.9 10*3/uL (ref 4.0–10.5)
nRBC: 0 % (ref 0.0–0.2)

## 2019-02-05 LAB — BASIC METABOLIC PANEL
Anion gap: 13 (ref 5–15)
BUN: 95 mg/dL — ABNORMAL HIGH (ref 8–23)
CO2: 24 mmol/L (ref 22–32)
Calcium: 10.1 mg/dL (ref 8.9–10.3)
Chloride: 103 mmol/L (ref 98–111)
Creatinine, Ser: 5.11 mg/dL — ABNORMAL HIGH (ref 0.61–1.24)
GFR calc Af Amer: 11 mL/min — ABNORMAL LOW (ref >60)
GFR calc non Af Amer: 10 mL/min — ABNORMAL LOW (ref >60)
Glucose, Bld: 142 mg/dL — ABNORMAL HIGH (ref 70–99)
Potassium: 3.9 mmol/L (ref 3.5–5.1)
Sodium: 140 mmol/L (ref 135–145)

## 2019-02-05 LAB — PROTIME-INR
INR: 1.2 (ref 0.8–1.2)
Prothrombin Time: 14.9 seconds (ref 11.4–15.2)

## 2019-02-06 ENCOUNTER — Telehealth (HOSPITAL_COMMUNITY): Payer: Self-pay

## 2019-02-06 LAB — NOVEL CORONAVIRUS, NAA (HOSP ORDER, SEND-OUT TO REF LAB; TAT 18-24 HRS): SARS-CoV-2, NAA: NOT DETECTED

## 2019-02-06 MED ORDER — TORSEMIDE 20 MG PO TABS: 60.0000 mg | ORAL_TABLET | Freq: Every day | ORAL | 3 refills | Status: DC

## 2019-02-06 NOTE — Telephone Encounter (Signed)
-----   Message from Larey Dresser, MD sent at 02/05/2019  5:51 PM EST ----- BUN/creatinine higher on torsemide.  Decrease torsemide to 60 mg daily.

## 2019-02-08 ENCOUNTER — Encounter (HOSPITAL_COMMUNITY): Payer: Self-pay | Admitting: *Deleted

## 2019-02-08 ENCOUNTER — Ambulatory Visit (HOSPITAL_COMMUNITY): Payer: Medicare Other | Admitting: Certified Registered"

## 2019-02-08 ENCOUNTER — Encounter (HOSPITAL_COMMUNITY): Payer: Medicare Other

## 2019-02-08 ENCOUNTER — Other Ambulatory Visit: Payer: Self-pay

## 2019-02-08 ENCOUNTER — Encounter (HOSPITAL_COMMUNITY)
Admission: RE | Disposition: A | Discharge status: Home / Self Care | Payer: Self-pay | Source: Home / Self Care | Attending: Cardiology

## 2019-02-08 ENCOUNTER — Inpatient Hospital Stay (HOSPITAL_COMMUNITY)
Admission: RE | Admit: 2019-02-08 | Discharge: 2019-02-12 | DRG: 243 | Disposition: A | Discharge status: Home Health | Payer: Medicare Other | Attending: Cardiology | Admitting: Cardiology

## 2019-02-08 DIAGNOSIS — J449 Chronic obstructive pulmonary disease, unspecified: Secondary | ICD-10-CM | POA: Diagnosis present

## 2019-02-08 DIAGNOSIS — Z7952 Long term (current) use of systemic steroids: Secondary | ICD-10-CM | POA: Diagnosis not present

## 2019-02-08 DIAGNOSIS — Z23 Encounter for immunization: Secondary | ICD-10-CM

## 2019-02-08 DIAGNOSIS — F0390 Unspecified dementia without behavioral disturbance: Secondary | ICD-10-CM | POA: Diagnosis present

## 2019-02-08 DIAGNOSIS — Z87891 Personal history of nicotine dependence: Secondary | ICD-10-CM | POA: Diagnosis not present

## 2019-02-08 DIAGNOSIS — I130 Hypertensive heart and chronic kidney disease with heart failure and stage 1 through stage 4 chronic kidney disease, or unspecified chronic kidney disease: Secondary | ICD-10-CM | POA: Diagnosis present

## 2019-02-08 DIAGNOSIS — Z7901 Long term (current) use of anticoagulants: Secondary | ICD-10-CM | POA: Diagnosis not present

## 2019-02-08 DIAGNOSIS — Z79890 Hormone replacement therapy (postmenopausal): Secondary | ICD-10-CM

## 2019-02-08 DIAGNOSIS — I5032 Chronic diastolic (congestive) heart failure: Secondary | ICD-10-CM

## 2019-02-08 DIAGNOSIS — I442 Atrioventricular block, complete: Secondary | ICD-10-CM | POA: Diagnosis not present

## 2019-02-08 DIAGNOSIS — I483 Typical atrial flutter: Secondary | ICD-10-CM | POA: Diagnosis not present

## 2019-02-08 DIAGNOSIS — I2510 Atherosclerotic heart disease of native coronary artery without angina pectoris: Secondary | ICD-10-CM | POA: Diagnosis present

## 2019-02-08 DIAGNOSIS — I4892 Unspecified atrial flutter: Secondary | ICD-10-CM | POA: Diagnosis present

## 2019-02-08 DIAGNOSIS — Z95818 Presence of other cardiac implants and grafts: Secondary | ICD-10-CM

## 2019-02-08 DIAGNOSIS — K551 Chronic vascular disorders of intestine: Secondary | ICD-10-CM | POA: Diagnosis present

## 2019-02-08 DIAGNOSIS — E1122 Type 2 diabetes mellitus with diabetic chronic kidney disease: Secondary | ICD-10-CM | POA: Diagnosis present

## 2019-02-08 DIAGNOSIS — I272 Pulmonary hypertension, unspecified: Secondary | ICD-10-CM | POA: Diagnosis present

## 2019-02-08 DIAGNOSIS — R001 Bradycardia, unspecified: Secondary | ICD-10-CM | POA: Diagnosis not present

## 2019-02-08 DIAGNOSIS — E785 Hyperlipidemia, unspecified: Secondary | ICD-10-CM | POA: Diagnosis present

## 2019-02-08 DIAGNOSIS — N400 Enlarged prostate without lower urinary tract symptoms: Secondary | ICD-10-CM | POA: Diagnosis present

## 2019-02-08 DIAGNOSIS — N184 Chronic kidney disease, stage 4 (severe): Secondary | ICD-10-CM | POA: Diagnosis present

## 2019-02-08 DIAGNOSIS — Z8249 Family history of ischemic heart disease and other diseases of the circulatory system: Secondary | ICD-10-CM | POA: Diagnosis not present

## 2019-02-08 DIAGNOSIS — I132 Hypertensive heart and chronic kidney disease with heart failure and with stage 5 chronic kidney disease, or end stage renal disease: Secondary | ICD-10-CM | POA: Diagnosis not present

## 2019-02-08 DIAGNOSIS — Z79899 Other long term (current) drug therapy: Secondary | ICD-10-CM | POA: Diagnosis not present

## 2019-02-08 DIAGNOSIS — E1151 Type 2 diabetes mellitus with diabetic peripheral angiopathy without gangrene: Secondary | ICD-10-CM | POA: Diagnosis present

## 2019-02-08 DIAGNOSIS — Z20828 Contact with and (suspected) exposure to other viral communicable diseases: Secondary | ICD-10-CM | POA: Diagnosis present

## 2019-02-08 DIAGNOSIS — G4733 Obstructive sleep apnea (adult) (pediatric): Secondary | ICD-10-CM | POA: Diagnosis present

## 2019-02-08 DIAGNOSIS — E039 Hypothyroidism, unspecified: Secondary | ICD-10-CM | POA: Diagnosis present

## 2019-02-08 DIAGNOSIS — I10 Essential (primary) hypertension: Secondary | ICD-10-CM | POA: Diagnosis not present

## 2019-02-08 DIAGNOSIS — Z95810 Presence of automatic (implantable) cardiac defibrillator: Secondary | ICD-10-CM | POA: Diagnosis not present

## 2019-02-08 DIAGNOSIS — N185 Chronic kidney disease, stage 5: Secondary | ICD-10-CM | POA: Diagnosis not present

## 2019-02-08 HISTORY — PX: CARDIOVERSION: SHX1299

## 2019-02-08 LAB — CBC
HCT: 30.8 % — ABNORMAL LOW (ref 39.0–52.0)
Hemoglobin: 9.6 g/dL — ABNORMAL LOW (ref 13.0–17.0)
MCH: 31.8 pg (ref 26.0–34.0)
MCHC: 31.2 g/dL (ref 30.0–36.0)
MCV: 102 fL — ABNORMAL HIGH (ref 80.0–100.0)
Platelets: 100 10*3/uL — ABNORMAL LOW (ref 150–400)
RBC: 3.02 MIL/uL — ABNORMAL LOW (ref 4.22–5.81)
RDW: 14.7 % (ref 11.5–15.5)
WBC: 4 10*3/uL (ref 4.0–10.5)
nRBC: 0 % (ref 0.0–0.2)

## 2019-02-08 LAB — BASIC METABOLIC PANEL
Anion gap: 10 (ref 5–15)
BUN: 84 mg/dL — ABNORMAL HIGH (ref 8–23)
CO2: 19 mmol/L — ABNORMAL LOW (ref 22–32)
Calcium: 8.7 mg/dL — ABNORMAL LOW (ref 8.9–10.3)
Chloride: 113 mmol/L — ABNORMAL HIGH (ref 98–111)
Creatinine, Ser: 4.37 mg/dL — ABNORMAL HIGH (ref 0.61–1.24)
GFR calc Af Amer: 13 mL/min — ABNORMAL LOW (ref >60)
GFR calc non Af Amer: 12 mL/min — ABNORMAL LOW (ref >60)
Glucose, Bld: 94 mg/dL (ref 70–99)
Potassium: 3.7 mmol/L (ref 3.5–5.1)
Sodium: 142 mmol/L (ref 135–145)

## 2019-02-08 LAB — SURGICAL PCR SCREEN
MRSA, PCR: NEGATIVE
Staphylococcus aureus: NEGATIVE

## 2019-02-08 SURGERY — CARDIOVERSION
Anesthesia: General

## 2019-02-08 MED ORDER — SODIUM CHLORIDE 0.9% FLUSH: 3.0000 mL | INTRAVENOUS | Status: DC | PRN

## 2019-02-08 MED ORDER — APIXABAN 2.5 MG PO TABS: 2.5000 mg | ORAL_TABLET | ORAL | Status: DC

## 2019-02-08 MED ORDER — ACETAMINOPHEN 325 MG PO TABS: 650.0000 mg | ORAL_TABLET | ORAL | Status: DC | PRN

## 2019-02-08 MED ORDER — SODIUM CHLORIDE 0.9 % IV SOLN
250.0000 mL | INTRAVENOUS | Status: DC | PRN
  Administered 2019-02-09: 250 mL via INTRAVENOUS

## 2019-02-08 MED ORDER — SODIUM CHLORIDE 0.9 % IV SOLN: 250.0000 mL | INTRAVENOUS | Status: DC

## 2019-02-08 MED ORDER — SODIUM CHLORIDE 0.9% FLUSH
3.0000 mL | Freq: Two times a day (BID) | INTRAVENOUS | Status: DC
  Administered 2019-02-08 – 2019-02-11 (×5): 3 mL via INTRAVENOUS

## 2019-02-08 MED ORDER — APIXABAN 2.5 MG PO TABS
2.5000 mg | ORAL_TABLET | Freq: Two times a day (BID) | ORAL | Status: DC
  Administered 2019-02-08 – 2019-02-12 (×8): 2.5 mg via ORAL
  Filled 2019-02-08 (×8): qty 1

## 2019-02-08 MED ORDER — LEVOTHYROXINE SODIUM 75 MCG PO TABS
150.0000 ug | ORAL_TABLET | Freq: Every day | ORAL | Status: DC
  Administered 2019-02-09 – 2019-02-12 (×4): 150 ug via ORAL
  Filled 2019-02-08 (×4): qty 2

## 2019-02-08 MED ORDER — CEFAZOLIN SODIUM-DEXTROSE 2-4 GM/100ML-% IV SOLN
2.0000 g | INTRAVENOUS | Status: AC
  Administered 2019-02-09: 2 g via INTRAVENOUS
  Filled 2019-02-08: qty 100

## 2019-02-08 MED ORDER — PROPOFOL 10 MG/ML IV BOLUS
INTRAVENOUS | Status: DC | PRN
  Administered 2019-02-08: 50 mg via INTRAVENOUS

## 2019-02-08 MED ORDER — SODIUM CHLORIDE 0.9 % IV SOLN
80.0000 mg | INTRAVENOUS | Status: AC
  Administered 2019-02-09: 80 mg
  Filled 2019-02-08: qty 2

## 2019-02-08 MED ORDER — CHLORHEXIDINE GLUCONATE 4 % EX LIQD
60.0000 mL | Freq: Once | CUTANEOUS | Status: AC
  Administered 2019-02-09: 4 via TOPICAL
  Filled 2019-02-08: qty 60

## 2019-02-08 MED ORDER — HYDRALAZINE HCL 50 MG PO TABS
100.0000 mg | ORAL_TABLET | Freq: Three times a day (TID) | ORAL | Status: DC
  Administered 2019-02-08 – 2019-02-12 (×11): 100 mg via ORAL
  Filled 2019-02-08 (×11): qty 2

## 2019-02-08 MED ORDER — SODIUM CHLORIDE 0.9 % IV SOLN
INTRAVENOUS | Status: DC
  Administered 2019-02-09: 11:00:00 via INTRAVENOUS

## 2019-02-08 MED ORDER — DOXAZOSIN MESYLATE 4 MG PO TABS
4.0000 mg | ORAL_TABLET | Freq: Every day | ORAL | Status: DC
  Administered 2019-02-09 – 2019-02-12 (×4): 4 mg via ORAL
  Filled 2019-02-08 (×4): qty 1

## 2019-02-08 MED ORDER — AMLODIPINE BESYLATE 10 MG PO TABS: 10.0000 mg | ORAL_TABLET | Freq: Every day | ORAL | Status: DC

## 2019-02-08 MED ORDER — ATORVASTATIN CALCIUM 10 MG PO TABS
20.0000 mg | ORAL_TABLET | Freq: Every day | ORAL | Status: DC
  Administered 2019-02-09 – 2019-02-12 (×4): 20 mg via ORAL
  Filled 2019-02-08 (×4): qty 2

## 2019-02-08 MED ORDER — LIDOCAINE 2% (20 MG/ML) 5 ML SYRINGE
INTRAMUSCULAR | Status: DC | PRN
  Administered 2019-02-08: 60 mg via INTRAVENOUS

## 2019-02-08 MED ORDER — ONDANSETRON HCL 4 MG/2ML IJ SOLN: 4.0000 mg | Freq: Four times a day (QID) | INTRAMUSCULAR | Status: DC | PRN

## 2019-02-08 MED ORDER — MEMANTINE HCL 5 MG PO TABS
10.0000 mg | ORAL_TABLET | Freq: Two times a day (BID) | ORAL | Status: DC
  Administered 2019-02-08 – 2019-02-12 (×8): 10 mg via ORAL
  Filled 2019-02-08 (×5): qty 1
  Filled 2019-02-08: qty 2
  Filled 2019-02-08 (×4): qty 1
  Filled 2019-02-08: qty 2
  Filled 2019-02-08: qty 1

## 2019-02-08 MED ORDER — SODIUM CHLORIDE 0.9% FLUSH
3.0000 mL | Freq: Two times a day (BID) | INTRAVENOUS | Status: DC
  Administered 2019-02-08 – 2019-02-12 (×5): 3 mL via INTRAVENOUS

## 2019-02-08 MED ORDER — DONEPEZIL HCL 10 MG PO TABS
10.0000 mg | ORAL_TABLET | Freq: Every day | ORAL | Status: DC
  Administered 2019-02-08 – 2019-02-11 (×4): 10 mg via ORAL
  Filled 2019-02-08 (×8): qty 1

## 2019-02-08 MED ORDER — SODIUM CHLORIDE 0.9 % IV SOLN
INTRAVENOUS | Status: DC | PRN
  Administered 2019-02-08: 13:00:00 via INTRAVENOUS

## 2019-02-08 MED ORDER — CALCITRIOL 0.25 MCG PO CAPS
0.2500 ug | ORAL_CAPSULE | Freq: Every day | ORAL | Status: DC
  Administered 2019-02-09 – 2019-02-12 (×4): 0.25 ug via ORAL
  Filled 2019-02-08 (×4): qty 1

## 2019-02-08 MED ORDER — CHLORHEXIDINE GLUCONATE 4 % EX LIQD
60.0000 mL | Freq: Once | CUTANEOUS | Status: AC
  Administered 2019-02-09: 4 via TOPICAL
  Filled 2019-02-08: qty 15

## 2019-02-08 NOTE — H&P (Addendum)
Advanced Heart Failure Team History and Physical Note   PCP:  [name], MD  PCP-Cardiology: Fransico Him, MD     Reason for Admission: Complete Heart Block    HPI:   Jeffrey Campbell is an 85 year old with a history of CAD, PAD, CKD Stage IV, sinus bradycardia, dementia, chronic diastolic heart failure, and pulmonary HTN.   Followed closely in the HF clinic and was seen by Dr Aundra Dubin on 01/25/19. At that time he was in A flutter in the 43s. He was set up for cardioversion. He was also volume overloaded so he was switched to torsemide 80 mg daily.   Today he presented for cardioversion and post procedure he went in to complete heart block with rate in the 30s. Otherwise he feels ok. Denies SOB. Denies dizziness/chest pain. He has been taking all his medications.   Review of Systems: [y] = yes, [ ]  = no   General: Weight gain [ ] ; Weight loss [ ] ; Anorexia [ ] ; Fatigue [ Y]; Fever [ ] ; Chills [ ] ; Weakness [ ]   Cardiac: Chest pain/pressure [ ] ; Resting SOB [ ] ; Exertional SOB [ ] ; Orthopnea [ ] ; Pedal Edema [ ] ; Palpitations [ ] ; Syncope [ ] ; Presyncope [ ] ; Paroxysmal nocturnal dyspnea[ ]   Pulmonary: Cough [ ] ; Wheezing[ ] ; Hemoptysis[ ] ; Sputum [ ] ; Snoring [ ]   GI: Vomiting[ ] ; Dysphagia[ ] ; Melena[ ] ; Hematochezia [ ] ; Heartburn[ ] ; Abdominal pain [ ] ; Constipation [ ] ; Diarrhea [ ] ; BRBPR [ ]   GU: Hematuria[ ] ; Dysuria [ ] ; Nocturia[ ]   Vascular: Pain in legs with walking [ ] ; Pain in feet with lying flat [ ] ; Non-healing sores [ ] ; Stroke [ ] ; TIA [ ] ; Slurred speech [ ] ;  Neuro: Headaches[ ] ; Vertigo[ ] ; Seizures[ ] ; Paresthesias[ ] ;Blurred vision [ ] ; Diplopia [ ] ; Vision changes [ ]   Ortho/Skin: Arthritis [ ] ; Joint pain [Y ]; Muscle pain [ ] ; Joint swelling [ ] ; Back Pain [Y ]; Rash [ ]   Psych: Depression[ ] ; Anxiety[ ]   Heme: Bleeding problems [ ] ; Clotting disorders [ ] ; Anemia [ ]   Endocrine: Diabetes [ ] ; Thyroid dysfunction[ ]    Home Medications Prior to Admission  medications   Medication Sig Start Date End Date Taking? Authorizing Provider  amLODipine (NORVASC) 10 MG tablet Take 10 mg by mouth daily.   Yes [provider]  apixaban (ELIQUIS) 2.5 MG TABS tablet Take 1 tablet (2.5 mg total) by mouth 2 (two) times daily. 01/12/19  Yes Larey Dresser, MD  atorvastatin (LIPITOR) 20 MG tablet TAKE 1 TABLET BY MOUTH EVERY DAY Patient taking differently: Take 20 mg by mouth daily.  05/30/18  Yes Turner, Eber Hong, MD  Brinzolamide-Brimonidine [facility]) 1-0.2 % SUSP Place 1 drop into both eyes 2 (two) times daily.    Yes [provider]  calcitRIOL (ROCALTROL) 0.25 MCG capsule Take 0.25 mcg by mouth daily.   Yes [provider]  donepezil (ARICEPT) 10 MG tablet Take 1 tablet (10 mg total) by mouth at bedtime. 09/18/18  Yes Marcial Pacas, MD  doxazosin (CARDURA) 4 MG tablet Take 1 tablet (4 mg total) by mouth daily. 12/08/18  Yes Turner, Eber Hong, MD  Epoetin Alfa (PROCRIT IJ) Inject 5,000 Units as directed every 14 (fourteen) days.    Yes [provider]  hydrALAZINE (APRESOLINE) 100 MG tablet Take 1 tablet (100 mg total) by mouth 3 (three) times daily. Please make yearly appt with Dr. Radford Pax for October. 1st  attempt Patient taking differently: Take 100 mg by mouth 3 (three) times daily.  06/15/17  Yes Turner, Eber Hong, MD  levothyroxine (SYNTHROID, LEVOTHROID) 150 MCG tablet Take 150 mcg by mouth daily before breakfast.   Yes [provider]  losartan (COZAAR) 50 MG tablet Take 50 mg by mouth daily. 04/26/18  Yes [provider]  memantine (NAMENDA) 10 MG tablet TAKE 1 TABLET BY MOUTH TWICE A DAY Patient taking differently: Take 10 mg by mouth 2 (two) times daily.  10/10/18  Yes Marcial Pacas, MD  torsemide (DEMADEX) 20 MG tablet Take 3 tablets (60 mg total) by mouth daily. 02/06/19  Yes Larey Dresser, MD  triamcinolone ointment (KENALOG) 0.5 % Apply 1 application topically 2 (two) times daily as needed (rash).   Yes  [provider]    Past Medical History: Past Medical History:  Diagnosis Date   Anemia    low iron   BPH (benign prostatic hyperplasia)    CAD (coronary artery disease)    Carotid artery stenosis    1-39% bilateral carotid artery stenosis and < 50% stenosis in the right CCA by dopplers 06/2017   CKD (chronic kidney disease), stage III    Stage 4   Diastolic dysfunction    Glaucoma    Graves disease    Heart murmur, systolic    History of ETOH abuse    History of PFTs 05/2010   moderate airflow obstruction w reduced DLCO by PFT    Hyperlipemia    Hypertension    Hyperthyroidism 08/26/10   radioactive iodine therapy    Memory loss    Mitral regurgitation echo 2015   mild   Multiple thyroid nodules    MVP (mitral valve prolapse) 11/2012   posterior MVP   Organic impotence    OSA (obstructive sleep apnea)    upper airway resistance syndrome with RDI 18/hr - not on CPAP due to insurance not covering   Pre-diabetes    PUD (peptic ulcer disease)    Pulmonary hypertension ([place]) echo 2015   Group 2 with pulmonary venous HTN and Group 3 with OSA   Shutdown renal    after cardiac cath   Upper airway resistance syndrome     Past Surgical History: Past Surgical History:  Procedure Laterality Date   APPENDECTOMY     AV FISTULA PLACEMENT Left 10/18/2017   Procedure: ARTERIOVENOUS (AV) FISTULA CREATION ARM;  Surgeon: Waynetta Sandy, MD;  Location: [place];  Service: Vascular;  Laterality: Left;   CARDIAC CATHETERIZATION     CARDIAC CATHETERIZATION N/A 02/17/2015   Procedure: Right Heart Cath;  Surgeon: Larey Dresser, MD;  Location: [place] [facility];  Service: Cardiovascular;  Laterality: N/A;   ESOPHAGOGASTRODUODENOSCOPY (EGD) WITH PROPOFOL Left 10/16/2016   Procedure: ESOPHAGOGASTRODUODENOSCOPY (EGD) WITH PROPOFOL;  Surgeon: Ronnette Juniper, MD;  Location: [place];  Service: Gastroenterology;  Laterality: Left;   heart  catherization     HERNIA REPAIR  10/2009   IR RADIOLOGIST EVAL & MGMT  09/28/2016   IR RADIOLOGIST EVAL & MGMT  10/19/2016   IR RADIOLOGIST EVAL & MGMT  01/18/2017   RIGHT HEART CATH N/A 07/28/2016   Procedure: Right Heart Cath;  Surgeon: Larey Dresser, MD;  Location: [place] [facility];  Service: Cardiovascular;  Laterality: N/A;    Family History:  Family History  Problem Relation Age of Onset   Kidney failure Father    Hypertension Father    Colon cancer Mother  Colon cancer Brother    Lung disease Brother    Colon cancer Maternal Uncle     Social History: Social History   Socioeconomic History   Marital status: Married    Spouse name: Not on file   Number of children: 4   Years of education: completed high school   Highest education level: Not on file  Occupational History   Occupation: Retired    Fish farm manager: RETIRED    Comment: [employer]  Social Needs   Financial resource strain: Not on file   Food insecurity    Worry: Not on file    Inability: Not on Lexicographer needs    Medical: Not on file    Non-medical: Not on file  Tobacco Use   Smoking status: Former Smoker    Packs/day: 2.00    Years: 40.00    Pack years: 80.00    Types: Cigarettes    Quit date: 03/29/1984    Years since quitting: 34.8   Smokeless tobacco: Never Used  Substance and Sexual Activity   Alcohol use: No    Comment: quit in 1981   Drug use: No   Sexual activity: Not on file  Lifestyle   Physical activity    Days per week: Not on file    Minutes per session: Not on file   Stress: Not on file  Relationships   Social connections    Talks on phone: Not on file    Gets together: Not on file    Attends religious service: Not on file    Active member of club or organization: Not on file    Attends meetings of clubs or organizations: Not on file    Relationship status: Not on file  Other Topics Concern   Not on file  Social History Narrative     Lives at home with his wife.   Right-handed.   Occasional use of caffeine.    Allergies:  Allergies  Allergen Reactions   Iodinated Diagnostic Agents Other (See Comments)    Right heart cath 07/2016 allegedly "shut down his kidneys" Patient has stage III chronic kidney disease   Zocor [Simvastatin] Other (See Comments)    Liver problems    Budesonide-Formoterol Fumarate Other (See Comments)    Dry mouth   Minoxidil Other (See Comments)    Unknown raction   Tiotropium Bromide Monohydrate Other (See Comments)    Dry mouth   Tricor [Fenofibrate]     Unknown reaction    Objective:    Vital Signs:   Temp:  [97.6 F (36.4 C)] 97.6 F (36.4 C) (11/12 1104) Pulse Rate:  [30-37] 35 (11/12 1328) Resp:  [10-17] 15 (11/12 1328) BP: (171-213)/(43-61) 171/52 (11/12 1328) SpO2:  [98 %-100 %] 98 % (11/12 1328) Weight:  [65.8 kg] 65.8 kg (11/12 1104)   Filed Weights   02/08/19 1104  Weight: 65.8 kg     Physical Exam     General:  [place]. No respiratory difficulty HEENT: Normal Neck: Supple. JVP 6-7. Carotids 2+ bilat; no bruits. No lymphadenopathy or thyromegaly appreciated. Cor: PMI nondisplaced. Regular rate & rhythm. No rubs, gallops or murmurs. Lungs: Clear Abdomen: Soft, nontender, nondistended. No hepatosplenomegaly. No bruits or masses. Good bowel sounds. Extremities: No cyanosis, clubbing, rash, trace  edema Neuro: Alert & oriented x 3, cranial nerves grossly intact. moves all 4 extremities w/o difficulty. Affect pleasant.   Telemetry   CHB 30s   EKG   CHB 30s per cardioversion.  Labs     Basic Metabolic Panel: Recent Labs  Lab 02/05/19 1138  NA 140  K 3.9  CL 103  CO2 24  GLUCOSE 142*  BUN 95*  CREATININE 5.11*  CALCIUM 10.1    Liver Function Tests: No results for input(s): AST, ALT, ALKPHOS, BILITOT, PROT, ALBUMIN in the last 168 hours. No results for input(s): LIPASE, AMYLASE in the last 168 hours. No results for input(s): AMMONIA  in the last 168 hours.  CBC: Recent Labs  Lab 02/05/19 1138  WBC 4.9  HGB 10.9*  HCT 34.6*  MCV 102.4*  PLT 128*    Cardiac Enzymes: No results for input(s): CKTOTAL, CKMB, CKMBINDEX, TROPONINI in the last 168 hours.  BNP: BNP (last 3 results) Recent Labs    05/20/18 1933  BNP 3,913.9*    ProBNP (last 3 results) Recent Labs    01/09/19 1019  PROBNP 21,225*     CBG: No results for input(s): GLUCAP in the last 168 hours.  Coagulation Studies: No results for input(s): LABPROT, INR in the last 72 hours.  Imaging:  No results found.    Assessment/Plan   1. CHB Post cardioversion. EP consulted. Plan for pacemaker tomorrow.  Admit to ICU. BP is stable.  Keep pacer pads on.   2. Atrial Flutter S/P DC-CV--->CHB Continue elquis 2.5 mg twice a day.  Check CBC in am   3. Chronic Diastolic Heart Failure  With prominent RV failure.  Patient has diastolic LV failure with pulmonary venous HTN on prior RHC.  CKD stage IV increases tendency to retain fluid.  PYP scan in 10/20 was not suggestive of ATTR amyloidosis.  Volume status ok. Continue home dose of torsemide.  Check BMET in am.   4. CKD Stage IV Creatinine 5. Stop losartan.   5. HTN  Elevated today. Restart home BP meds except losartan.   6. Pulmonary HTN Noted by echo in 2016 with PASP 68 mmHg and moderately dilated RV.  PFTs showed mild obstruction with moderate to severely decreased DLCO, suggesting a pulmonary vascular problem.  CT chest in 2/16 did not show definitive ILD.  V/Q scan did not show evidence for acute or chronic PE.  He carries a history of OSA but is not using CPAP.  RHC showed mild pulmonary venous hypertension, PVR was only 1.8 WU.   He had echo in 12/17 with PASP 77 mmHg but RHC again in 5/18 showed pulmonary venous hypertension.  Suspect he has significant diastolic CHF.  Most recent echo in 10/20 showed PASP 72 mmHg.  Manage with diuesis. No role for pulmonary vasodilators.   7.  PAD Renal artery stenosis, mesenteric artery stenosis, distal aortic stenosis. - Continue statin.   Admit to ICU. EP consulted. Check BMET/CBC/BNP   Wife was notified of admit Dr Nechama Guard.    Darrick Grinder, NP 02/08/2019, 3:06 PM  Advanced Heart Failure Team Pager [phone] (M-F; 7a - 4p)  Please contact [facility] for night-coverage after hours (4p -7a ) and weekends on [email or web address]  Patient seen with NP, agree with the above note.   Patient presented for DCCV today, HR was around 40 in atrial flutter initially.  After DCCV, he was in CHB with escape rate upper 30s.  BP stable, he feels fine.    General: NAD Neck: JVP 8 cm, no thyromegaly or thyroid nodule.  Lungs: Clear to auscultation bilaterally with normal respiratory effort. CV: Nondisplaced PMI.  Heart bradycardic, regular S1/S2, no S3/S4, no murmur.  1+ ankle edema.  Abdomen: Soft, nontender, no hepatosplenomegaly, no distention.  Skin: Intact without lesions or rashes.  Neurologic: Alert and oriented x 3.  Psych: Normal affect. Extremities: No clubbing or cyanosis.  HEENT: Normal.   1. Complete heart block: Patient has a long history of sinus bradycardia in 40s-50s, recently noted to be in atrial flutter with worsening HF.  Patient was cardioverted out of slow atrial flutter today with underlying complete heart block, escape rate in 30s.  BP stable.  He is on Aricept for dementia, otherwise no nodal blockers.   - Discussed with EP, will admit and plan for PPM tomorrow.  - I got in touch with his wife and talked with her about this.  2. Pulmonary hypertension: Noted by echo in 2016 with PASP 68 mmHg and moderately dilated RV.  PFTs showed mild obstruction with moderate to severely decreased DLCO, suggesting a pulmonary vascular problem.  CT chest in 2/16 did not show definitive ILD.  V/Q scan did not show evidence for acute or chronic PE.  He carries a history of OSA but is not using CPAP.  RHC showed mild pulmonary venous  hypertension, PVR was only 1.8 WU.   He had echo in 12/17 with PASP 77 mmHg but RHC again in 5/18 showed pulmonary venous hypertension.  Suspect he has significant diastolic CHF.  Most recent echo in 10/20 showed PASP 72 mmHg.   - Treatment will be diuresis, would avoid selective pulmonary vasodilators given primarily pulmonary venous hypertension.   3. Chronic diastolic CHF: With prominent RV failure.  Patient has diastolic LV failure with pulmonary venous HTN on prior RHC.  CKD stage IV increases tendency to retain fluid.  PYP scan in 10/20 was not suggestive of ATTR amyloidosis. Atrial flutter may have worsened volume retention.  Recently transitioned from Lasix to torsemide, volume status looks improved today compared to last office visit, last creatinine was higher at 5.11.  - Needs BMET now.     - Hold torsemide for now pending procedure and creatinine check, will likely resume at 60 mg daily after PPM placement.  - Wears compression stockings during the day.  4. CKD: Stage IV.  Closely followed by Dr. Justin Mend with the thought that he may need HD soon.  Recent creatinine up to 5.1 - BMET now.    - Stop losartan.  5. HTN: Suspect bilateral renal artery stenosis plays a role.  BP now controlled.  - Based on review of notes it appears that intervention on renal arteries has been decided against.  6. PAD: Renal artery stenosis, mesenteric artery stenosis, distal aortic stenosis. - Continue statin.  7. CAD: Stable with no chest pain.  Continue statin. Goal LDL < 70.  4/16 Cardiolite with no ischemia. No ASA given need for anticoagulation.  8. Atrial flutter: He remains on apixaban at 2.5 mg bid, now out of flutter s/p DCCV.   Loralie Champagne 02/08/2019 4:21 PM

## 2019-02-08 NOTE — Interval H&P Note (Signed)
History and Physical Interval Note:  02/08/2019 1:23 PM  Jeffrey Campbell  has presented today for surgery, with the diagnosis of AFIB.  The various methods of treatment have been discussed with the patient and family. After consideration of risks, benefits and other options for treatment, the patient has consented to  Procedure(s): CARDIOVERSION (N/A) as a surgical intervention.  The patient's history has been reviewed, patient examined, no change in status, stable for surgery.  I have reviewed the patient's chart and labs.  Questions were answered to the patient's satisfaction.     Donato Heinz

## 2019-02-08 NOTE — CV Procedure (Signed)
Procedure:   DCCV  Indication:  Symptomatic atrial flutter  Procedure Note:  The patient signed informed consent.  They have had had therapeutic anticoagulation with Eliquis greater than 3 weeks.  Anesthesia was administered by Dr. Ola Spurr.  Adequate airway was maintained throughout and vital followed per protocol.  Intial rhythm was atrial flutter with HR in 30s.  They were cardioverted x 1 with 100J of biphasic synchronized energy.  They converted out of atrial flutter and HR remained in 30s with sinus beats but AV dissociation consistent with complete heart block.   The patient had normal neuro status and respiratory status post procedure with vitals stable as recorded elsewhere.  Given complete heart block, discussed with Dr Aundra Dubin and will admit for EP evaluation for PPM.  Follow up:  They will continue on current medical therapy and follow up with cardiology as scheduled.  Oswaldo Milian, MD 02/08/2019 4:41 PM

## 2019-02-08 NOTE — Anesthesia Postprocedure Evaluation (Signed)
Anesthesia Post Note  Patient: Jeffrey Campbell  Procedure(s) Performed: CARDIOVERSION (N/A )     Patient location during evaluation: Endoscopy Anesthesia Type: General Level of consciousness: awake and alert Pain management: pain level controlled Vital Signs Assessment: post-procedure vital signs reviewed and stable Respiratory status: spontaneous breathing, nonlabored ventilation and respiratory function stable Cardiovascular status: blood pressure returned to baseline and stable Postop Assessment: no apparent nausea or vomiting Anesthetic complications: no    Last Vitals:  Vitals:   02/08/19 1327 02/08/19 1328  BP: (!) 172/51 (!) 171/52  Pulse: (!) 32 (!) 35  Resp: 15 15  Temp:    SpO2: 100% 98%    Last Pain:  Vitals:   02/08/19 1328  TempSrc:   PainSc: 0-No pain                 Xochilth Standish,W. EDMOND

## 2019-02-08 NOTE — Consult Note (Addendum)
Cardiology Consultation:   Patient ID: Jeffrey Campbell MRN: [id number]; DOB: 01/30/1933  Admit date: 02/08/2019 Date of Consult: 02/08/2019  Primary Care Provider: [name], MD Primary Cardiologist: Fransico Him, MD  AHF: Dr. Aundra Dubin Primary Electrophysiologist:  None    Patient Profile:   Jeffrey Campbell is a 85 y.o. male with a hx of CKD (III-IV), CAD (diffuse moderate CAD 2012), PVD, p.HTN, COPD (mild), chronic diastolic CHF, dementia, persistent AFlutter who is being seen today for the evaluation of bradycardia at the request of Dr. Aundra Dubin.  History of Present Illness:   Mr. Spease was brought to [facility] electively for DCCV.  Post cardioversion noted in CHB.  V rates 30bpm sinus rate not much faster then that about 35bpm.  The patient hemodynamically stable, BP 160's-170systolic,  at his known cognitive level with baseline dementia, not lethargic.  He is being admitted, EP asked to evaluate for PPM, given longstanding bradycardia, now with advanced heart block.  Home meds reviewed for potential nodal blocking drugs Amlodipine not f elt to contribute He is on Aricept that can cause block, though given his dementia would not stop it  Labs 02/05/2019 K+ 3.9 BUN/Creat 95/5.11 (baseline Creat looks low 4's) WBC 4.9 H.H 10/34 Plts 128 COVID negative  The pt is AAOx4, he is sitting in the bedside chair, feeling well, denies any CP, palpitations, no SOB, dizziness, no reports of near syncope or syncope.   Heart Pathway Score:     Past Medical History:  Diagnosis Date  . Anemia    low iron  . BPH (benign prostatic hyperplasia)   . CAD (coronary artery disease)   . Carotid artery stenosis    1-39% bilateral carotid artery stenosis and < 50% stenosis in the right CCA by dopplers 06/2017  . CKD (chronic kidney disease), stage III    Stage 4  . Diastolic dysfunction   . Glaucoma   . Graves disease   . Heart murmur, systolic   . History of ETOH abuse   . History of PFTs  05/2010   moderate airflow obstruction w reduced DLCO by PFT   . Hyperlipemia   . Hypertension   . Hyperthyroidism 08/26/10   radioactive iodine therapy   . Memory loss   . Mitral regurgitation echo 2015   mild  . Multiple thyroid nodules   . MVP (mitral valve prolapse) 11/2012   posterior MVP  . Organic impotence   . OSA (obstructive sleep apnea)    upper airway resistance syndrome with RDI 18/hr - not on CPAP due to insurance not covering  . Pre-diabetes   . PUD (peptic ulcer disease)   . Pulmonary hypertension ([place]) echo 2015   Group 2 with pulmonary venous HTN and Group 3 with OSA  . Shutdown renal    after cardiac cath  . Upper airway resistance syndrome     Past Surgical History:  Procedure Laterality Date  . APPENDECTOMY    . AV FISTULA PLACEMENT Left 10/18/2017   Procedure: ARTERIOVENOUS (AV) FISTULA CREATION ARM;  Surgeon: Waynetta Sandy, MD;  Location: [facility];  Service: Vascular;  Laterality: Left;  . CARDIAC CATHETERIZATION    . CARDIAC CATHETERIZATION N/A 02/17/2015   Procedure: Right Heart Cath;  Surgeon: Larey Dresser, MD;  Location: [place] [facility];  Service: Cardiovascular;  Laterality: N/A;  . ESOPHAGOGASTRODUODENOSCOPY (EGD) WITH PROPOFOL Left 10/16/2016   Procedure: ESOPHAGOGASTRODUODENOSCOPY (EGD) WITH PROPOFOL;  Surgeon: Ronnette Juniper, MD;  Location: [facility];  Service: Gastroenterology;  Laterality: Left;  . heart catherization    . HERNIA REPAIR  10/2009  . IR RADIOLOGIST EVAL & MGMT  09/28/2016  . IR RADIOLOGIST EVAL & MGMT  10/19/2016  . IR RADIOLOGIST EVAL & MGMT  01/18/2017  . RIGHT HEART CATH N/A 07/28/2016   Procedure: Right Heart Cath;  Surgeon: Larey Dresser, MD;  Location: [place] [facility];  Service: Cardiovascular;  Laterality: N/A;     Home Medications:  Prior to Admission medications   Medication Sig Start Date End Date Taking? Authorizing Provider  amLODipine (NORVASC) 10 MG tablet Take 10 mg by mouth daily.   Yes [provider]  apixaban (ELIQUIS) 2.5 MG TABS tablet Take 1 tablet (2.5 mg total) by mouth 2 (two) times daily. 01/12/19  Yes Larey Dresser, MD  atorvastatin (LIPITOR) 20 MG tablet TAKE 1 TABLET BY MOUTH EVERY DAY Patient taking differently: Take 20 mg by mouth daily.  05/30/18  Yes Turner, Eber Hong, MD  Brinzolamide-Brimonidine [facility]) 1-0.2 % SUSP Place 1 drop into both eyes 2 (two) times daily.    Yes [provider]  calcitRIOL (ROCALTROL) 0.25 MCG capsule Take 0.25 mcg by mouth daily.   Yes [provider]  donepezil (ARICEPT) 10 MG tablet Take 1 tablet (10 mg total) by mouth at bedtime. 09/18/18  Yes Marcial Pacas, MD  doxazosin (CARDURA) 4 MG tablet Take 1 tablet (4 mg total) by mouth daily. 12/08/18  Yes Turner, Eber Hong, MD  Epoetin Alfa (PROCRIT IJ) Inject 5,000 Units as directed every 14 (fourteen) days.    Yes [provider]  hydrALAZINE (APRESOLINE) 100 MG tablet Take 1 tablet (100 mg total) by mouth 3 (three) times daily. Please make yearly appt with Dr. Radford Pax for October. 1st attempt Patient taking differently: Take 100 mg by mouth 3 (three) times daily.  06/15/17  Yes Turner, Eber Hong, MD  levothyroxine (SYNTHROID, LEVOTHROID) 150 MCG tablet Take 150 mcg by mouth daily before breakfast.   Yes [provider]  losartan (COZAAR) 50 MG tablet Take 50 mg by mouth daily. 04/26/18  Yes [provider]  memantine (NAMENDA) 10 MG tablet TAKE 1 TABLET BY MOUTH TWICE A DAY Patient taking differently: Take 10 mg by mouth 2 (two) times daily.  10/10/18  Yes Marcial Pacas, MD  torsemide (DEMADEX) 20 MG tablet Take 3 tablets (60 mg total) by mouth daily. 02/06/19  Yes Larey Dresser, MD  triamcinolone ointment (KENALOG) 0.5 % Apply 1 application topically 2 (two) times daily as needed (rash).   Yes [provider]    Inpatient Medications: Scheduled Meds: . apixaban  2.5 mg Oral STAT   Continuous Infusions:  PRN Meds:   Allergies:     Allergies  Allergen Reactions  . Iodinated Diagnostic Agents Other (See Comments)    Right heart cath 07/2016 allegedly "shut down his kidneys" Patient has stage III chronic kidney disease  . Zocor [Simvastatin] Other (See Comments)    Liver problems   . Budesonide-Formoterol Fumarate Other (See Comments)    Dry mouth  . Minoxidil Other (See Comments)    Unknown raction  . Tiotropium Bromide Monohydrate Other (See Comments)    Dry mouth  . Tricor [Fenofibrate]     Unknown reaction    Social History:   Social History   Socioeconomic History  . Marital status: Married    Spouse name: Not on file  . Number of children: 4  . Years of education: completed high school  .  Highest education level: Not on file  Occupational History  . Occupation: Retired    Fish farm manager: RETIRED    Comment: [employer]  Social Needs  . Financial resource strain: Not on file  . Food insecurity    Worry: Not on file    Inability: Not on file  . Transportation needs    Medical: Not on file    Non-medical: Not on file  Tobacco Use  . Smoking status: Former Smoker    Packs/day: 2.00    Years: 40.00    Pack years: 80.00    Types: Cigarettes    Quit date: 03/29/1984    Years since quitting: 34.8  . Smokeless tobacco: Never Used  Substance and Sexual Activity  . Alcohol use: No    Comment: quit in 1981  . Drug use: No  . Sexual activity: Not on file  Lifestyle  . Physical activity    Days per week: Not on file    Minutes per session: Not on file  . Stress: Not on file  Relationships  . Social Herbalist on phone: Not on file    Gets together: Not on file    Attends religious service: Not on file    Active member of club or organization: Not on file    Attends meetings of clubs or organizations: Not on file    Relationship status: Not on file  . Intimate partner violence    Fear of current or ex partner: Not on file    Emotionally abused: Not on file    Physically abused:  Not on file    Forced sexual activity: Not on file  Other Topics Concern  . Not on file  Social History Narrative   Lives at home with his wife.   Right-handed.   Occasional use of caffeine.    Family History:   Family History  Problem Relation Age of Onset  . Kidney failure Father   . Hypertension Father   . Colon cancer Mother   . Colon cancer Brother   . Lung disease Brother   . Colon cancer Maternal Uncle      ROS:  Please see the history of present illness.  All other ROS reviewed and negative.     Physical Exam/Data:   Vitals:   02/08/19 1325 02/08/19 1326 02/08/19 1327 02/08/19 1328  BP:   (!) 172/51 (!) 171/52  Pulse: (!) 34 (!) 32 (!) 32 (!) 35  Resp: 11 15 15 15   Temp:      TempSrc:      SpO2: 100% 99% 100% 98%  Weight:        Intake/Output Summary (Last 24 hours) at 02/08/2019 1456 Last data filed at 02/08/2019 1324 Gross per 24 hour  Intake 100 ml  Output 0 ml  Net 100 ml   Last 3 Weights 02/08/2019 01/25/2019 01/18/2019  Weight (lbs) 145 lb 153 lb 9.6 oz 149 lb 6.4 oz  Weight (kg) 65.772 kg 69.673 kg 67.767 kg     Body mass index is 22.05 kg/m.  General:  Well nourished, well developed, in no acute distress HEENT: normal Lymph: no adenopathy Neck: no JVD Endocrine:  No thryomegaly Vascular: No carotid bruits Cardiac:  RRR; bradycardic, no murmurs, gallops or rubs Lungs:  clear to auscultation bilaterally, no wheezing, rhonchi or rales  Abd: soft, nontender Ext: no edema Musculoskeletal:  No deformities, age appropriate atrophy, thin body habitus Skin: warm and dry  Neuro:  no  focal abnormalities noted, he carries diagnosis of dementia, he is current AAO x4 Psych:  Normal affect   EKG:  The EKG was personally reviewed and demonstrates:   CHB 34bpm, PVC, icRBBB, QRS 151ms   01/25/2019 Atypical Aflutter, V rate 40bpm, icRBBB, QRS 30ms 09/01/2018 SRB 44, 1st degree AVblock, PR 323ms, QRS 60ms  Telemetry:  Telemetry was personally reviewed  and demonstrates:   CHB 30's, briefly seems to conduct intermittently 1:1 with long 1st degree AVblock  Relevant CV Studies:   01/05/2019: TTE IMPRESSIONS  1. Patient appears to be in atrial flutter with slow ventricular rate, with pronounced fluttering of mitral valve seen on short axis and m mode.  2. Left ventricular ejection fraction, by visual estimation, is 55 to 60%. The left ventricle has normal function. Mildly increased left ventricular size. There is mildly increased left ventricular hypertrophy.  3. Left ventricular diastolic Doppler parameters are indeterminate pattern of LV diastolic filling.  4. Global right ventricle has moderately reduced systolic function.The right ventricular size is moderately enlarged. Mildly increased right ventricular wall thickness.  5. Left atrial size was severely dilated.  6. Right atrial size was severely dilated.  7. Small pericardial effusion.  8. Moderate calcification of the mitral valve leaflet(s).  9. Moderate thickening of the mitral valve leaflet(s). 10. The mitral valve is degenerative. Trace mitral valve regurgitation. 11. The tricuspid valve is normal in structure. Tricuspid valve regurgitation is mild. 12. The aortic valve is tricuspid Aortic valve regurgitation is trivial by color flow Doppler. Mild aortic valve sclerosis without stenosis. 13. The pulmonic valve was normal in structure. Pulmonic valve regurgitation is trivial by color flow Doppler. 14. Severely elevated pulmonary artery systolic pressure. 15. The inferior vena cava is dilated in size with <50% respiratory variability, suggesting right atrial pressure of 15 mmHg. 16. Severe pulmonary hypertension, RVSP 72 mmHg.   07/28/16: RHC 1. Mildly elevated right and left heart filling pressures.  2. Preserved cardiac output.  3. Moderate pulmonary hypertension.  PVR 1.74 WU, so suspect primarily pulmonary venous hypertension and possible component of PH from OSA.   Would not  treat with selective pulmonary vasodilators.  Likely best treatment remains diuresis and CPAP.    CAD: LHC (2012) with 50-60% ostial LAD, 50-60% mLAD, 60% pRCA.   Cardiolite (4/16) with EF 58%, no ischemia or infarction.    Sinus bradycardia: Holter (10/16) with average HR 44, occasional junctional rhythm.     Laboratory Data:  High Sensitivity Troponin:  No results for input(s): TROPONINIHS in the last 720 hours.   Chemistry Recent Labs  Lab 02/05/19 1138  NA 140  K 3.9  CL 103  CO2 24  GLUCOSE 142*  BUN 95*  CREATININE 5.11*  CALCIUM 10.1  GFRNONAA 10*  GFRAA 11*  ANIONGAP 13    No results for input(s): PROT, ALBUMIN, AST, ALT, ALKPHOS, BILITOT in the last 168 hours. Hematology Recent Labs  Lab 02/05/19 1138  WBC 4.9  RBC 3.38*  HGB 10.9*  HCT 34.6*  MCV 102.4*  MCH 32.2  MCHC 31.5  RDW 14.8  PLT 128*   BNPNo results for input(s): BNP, PROBNP in the last 168 hours.  DDimer No results for input(s): DDIMER in the last 168 hours.   Radiology/Studies:  No results found.  Assessment and Plan:   1. CHB, V rates 30's     Baseline conduction system disease     On Aricept though given dementia, would not anticipate stopping this agent  No other reversible causes noted  Longstanding bradycardia in sinus and in flutter  Agree with recommendation for PPM  Dr. Gardiner Rhyme was unable to get ahold of his wife, though was able to communicate with his daughter notifying that the patient was going to be admitted and likely need for PPM tomorrow.   I discussed rational/indication for PPM, thr procedure, potential risks, benefits, he is agreeable I was able to reach his wife, but did speak to his daughter Genevieve Norlander (listed onhis DPR)  She stated they knew he was being admitted and likely would get PPM  I discussed the indication for PPM with her as well as the procedure, potential risks and benefits, she is also agreeable and she plans to be here in the  morning.   The pt has iodine listed as an allergy 2/2 renal failure...  Will plan to avoid/try not to use any contrast    2. Chronic diastolic CHF      Appears compensated currently     Deferred to AHF team   3. Dementia     The patient is AAO self, place, time and situation     His daughter mentions that he gets forgetful, tends to wander off, staff was made aware               4. CKD (III-IV)     BMET in AM  5. Persistent AFlutter     CHA2S2Vasc is 4, on Eliquis appropriately dosed     S/p DCCV will be unable to interrupt a/c for his procedure       For questions or updates, please contact [place] Please consult [email or web address] for contact info under     Signed, Baldwin Jamaica, PA-C  02/08/2019 2:56 PM;   I have seen, examined the patient, and reviewed the above assessment and plan.  Changes to above are made where necessary.  On exam, bradycardic irregular rhythm.  The patient has symptomatic bradycardia post cardioversion.  There are no reversible causes.  I would therefore recommend pacemaker implantation at this time.  Risks, benefits, alternatives to pacemaker implantation were discussed in detail with the patient today.  He would like to think it over and discuss further with Dr Curt Bears in the AM.  NPO except medicines for possible PPM on Friday am.   Co Sign: Thompson Grayer, MD

## 2019-02-08 NOTE — Transfer of Care (Signed)
Immediate Anesthesia Transfer of Care Note  Patient: Jeffrey Campbell  Procedure(s) Performed: CARDIOVERSION (N/A )  Patient Location: Endoscopy Unit  Anesthesia Type:General  Level of Consciousness: awake, alert  and oriented  Airway & Oxygen Therapy: Patient Spontanous Breathing  Post-op Assessment: Report given to RN and Post -op Vital signs reviewed and stable  Post vital signs: Reviewed and stable  Last Vitals:  Vitals Value Taken Time  BP 172/51 02/08/19 1327  Temp    Pulse 32 02/08/19 1327  Resp 15 02/08/19 1327  SpO2 100 % 02/08/19 1327    Last Pain:  Vitals:   02/08/19 1104  TempSrc: Oral  PainSc: 0-No pain         Complications: No apparent anesthesia complications

## 2019-02-08 NOTE — Anesthesia Preprocedure Evaluation (Addendum)
Anesthesia Evaluation  Patient identified by MRN, date of birth, ID band Patient awake    Reviewed: Allergy & Precautions, H&P , NPO status , Patient's Chart, lab work & pertinent test results  Airway Mallampati: II  TM Distance: >3 FB Neck ROM: Full    Dental no notable dental hx. (+) Teeth Intact, Dental Advisory Given   Pulmonary sleep apnea and Continuous Positive Airway Pressure Ventilation , COPD, former smoker,    Pulmonary exam normal breath sounds clear to auscultation       Cardiovascular hypertension, Pt. on medications + Peripheral Vascular Disease and +CHF  + dysrhythmias Atrial Fibrillation  Rhythm:Irregular Rate:Normal     Neuro/Psych Dementia negative neurological ROS     GI/Hepatic Neg liver ROS, PUD,   Endo/Other  diabetesHypothyroidism   Renal/GU Renal diseasenegative Renal ROS  negative genitourinary   Musculoskeletal   Abdominal   Peds  Hematology  (+) Blood dyscrasia, anemia ,   Anesthesia Other Findings   Reproductive/Obstetrics negative OB ROS                            Anesthesia Physical Anesthesia Plan  ASA: III  Anesthesia Plan: General   Post-op Pain Management:    Induction: Intravenous  PONV Risk Score and Plan: 2 and Treatment may vary due to age or medical condition  Airway Management Planned: Mask  Additional Equipment:   Intra-op Plan:   Post-operative Plan:   Informed Consent: I have reviewed the patients History and Physical, chart, labs and discussed the procedure including the risks, benefits and alternatives for the proposed anesthesia with the patient or authorized representative who has indicated his/her understanding and acceptance.     Dental advisory given  Plan Discussed with: CRNA  Anesthesia Plan Comments:         Anesthesia Quick Evaluation

## 2019-02-08 NOTE — Progress Notes (Signed)
Per MD Aundra Dubin, post cardioversion with HR in the 30-40's in heart block rhythm, he wanted to get vitals after sitting/walking and an assessment on how the patient was feeling whether he was dizzy/weak/having chest pain. See below.   Lying - BP- 139/93 40 pulse Sitting- BP- 173/60 47 pulse After walking- BP-164/63 51 pulse   He denied feeling any dizziness or chest pain and said he felt good.  After MD Aundra Dubin and MD Gardiner Rhyme spoke, they felt it best for the patient to be admitted and for cardiac workup. MD Gardiner Rhyme to speak with patient and family.

## 2019-02-09 ENCOUNTER — Encounter (HOSPITAL_COMMUNITY)
Admission: RE | Disposition: A | Discharge status: Home / Self Care | Payer: Self-pay | Source: Home / Self Care | Attending: Cardiology

## 2019-02-09 ENCOUNTER — Encounter (HOSPITAL_COMMUNITY): Payer: Self-pay | Admitting: Cardiology

## 2019-02-09 DIAGNOSIS — I442 Atrioventricular block, complete: Secondary | ICD-10-CM

## 2019-02-09 HISTORY — PX: PACEMAKER IMPLANT: EP1218

## 2019-02-09 LAB — CBC WITH DIFFERENTIAL/PLATELET
Abs Immature Granulocytes: 0.01 10*3/uL (ref 0.00–0.07)
Basophils Absolute: 0 10*3/uL (ref 0.0–0.1)
Basophils Relative: 1 %
Eosinophils Absolute: 0.2 10*3/uL (ref 0.0–0.5)
Eosinophils Relative: 5 %
HCT: 32.1 % — ABNORMAL LOW (ref 39.0–52.0)
Hemoglobin: 10.2 g/dL — ABNORMAL LOW (ref 13.0–17.0)
Immature Granulocytes: 0 %
Lymphocytes Relative: 19 %
Lymphs Abs: 0.9 10*3/uL (ref 0.7–4.0)
MCH: 32.2 pg (ref 26.0–34.0)
MCHC: 31.8 g/dL (ref 30.0–36.0)
MCV: 101.3 fL — ABNORMAL HIGH (ref 80.0–100.0)
Monocytes Absolute: 0.4 10*3/uL (ref 0.1–1.0)
Monocytes Relative: 8 %
Neutro Abs: 3.2 10*3/uL (ref 1.7–7.7)
Neutrophils Relative %: 67 %
Platelets: 102 10*3/uL — ABNORMAL LOW (ref 150–400)
RBC: 3.17 MIL/uL — ABNORMAL LOW (ref 4.22–5.81)
RDW: 14.8 % (ref 11.5–15.5)
WBC: 4.7 10*3/uL (ref 4.0–10.5)
nRBC: 0 % (ref 0.0–0.2)

## 2019-02-09 LAB — BASIC METABOLIC PANEL
Anion gap: 15 (ref 5–15)
BUN: 97 mg/dL — ABNORMAL HIGH (ref 8–23)
CO2: 20 mmol/L — ABNORMAL LOW (ref 22–32)
Calcium: 9.9 mg/dL (ref 8.9–10.3)
Chloride: 105 mmol/L (ref 98–111)
Creatinine, Ser: 4.87 mg/dL — ABNORMAL HIGH (ref 0.61–1.24)
GFR calc Af Amer: 12 mL/min — ABNORMAL LOW (ref >60)
GFR calc non Af Amer: 10 mL/min — ABNORMAL LOW (ref >60)
Glucose, Bld: 110 mg/dL — ABNORMAL HIGH (ref 70–99)
Potassium: 4.4 mmol/L (ref 3.5–5.1)
Sodium: 140 mmol/L (ref 135–145)

## 2019-02-09 LAB — TSH: TSH: 24.079 u[IU]/mL — ABNORMAL HIGH (ref 0.350–4.500)

## 2019-02-09 LAB — MAGNESIUM: Magnesium: 2.2 mg/dL (ref 1.7–2.4)

## 2019-02-09 SURGERY — PACEMAKER IMPLANT

## 2019-02-09 MED ORDER — HEPARIN (PORCINE) IN NACL 1000-0.9 UT/500ML-% IV SOLN
INTRAVENOUS | Status: AC
  Filled 2019-02-09: qty 500

## 2019-02-09 MED ORDER — HEPARIN (PORCINE) IN NACL 1000-0.9 UT/500ML-% IV SOLN
INTRAVENOUS | Status: DC | PRN
  Administered 2019-02-09: 500 mL

## 2019-02-09 MED ORDER — SODIUM CHLORIDE 0.9 % IV SOLN
INTRAVENOUS | Status: AC
  Filled 2019-02-09: qty 2

## 2019-02-09 MED ORDER — PNEUMOCOCCAL VAC POLYVALENT 25 MCG/0.5ML IJ INJ
0.5000 mL | INJECTION | INTRAMUSCULAR | Status: AC
  Administered 2019-02-10: 0.5 mL via INTRAMUSCULAR
  Filled 2019-02-09 (×2): qty 0.5

## 2019-02-09 MED ORDER — CEFAZOLIN SODIUM-DEXTROSE 2-4 GM/100ML-% IV SOLN
INTRAVENOUS | Status: AC
  Filled 2019-02-09: qty 100

## 2019-02-09 MED ORDER — INFLUENZA VAC A&B SA ADJ QUAD 0.5 ML IM PRSY
0.5000 mL | PREFILLED_SYRINGE | INTRAMUSCULAR | Status: AC
  Administered 2019-02-10: 0.5 mL via INTRAMUSCULAR
  Filled 2019-02-09 (×2): qty 0.5

## 2019-02-09 MED ORDER — CHLORHEXIDINE GLUCONATE CLOTH 2 % EX PADS
6.0000 | MEDICATED_PAD | Freq: Every day | CUTANEOUS | Status: DC
  Administered 2019-02-09 – 2019-02-11 (×3): 6 via TOPICAL

## 2019-02-09 MED ORDER — CLEVIDIPINE BUTYRATE 0.5 MG/ML IV EMUL
0.0000 mg/h | INTRAVENOUS | Status: DC
  Administered 2019-02-09 (×2): 11 mg/h via INTRAVENOUS
  Administered 2019-02-09: 5 mg/h via INTRAVENOUS
  Administered 2019-02-09: 12 mg/h via INTRAVENOUS
  Administered 2019-02-09: 11 mg/h via INTRAVENOUS
  Administered 2019-02-09: 1 mg/h via INTRAVENOUS
  Administered 2019-02-09: 11 mg/h via INTRAVENOUS
  Administered 2019-02-09: 6 mg/h via INTRAVENOUS
  Administered 2019-02-10: 16 mg/h via INTRAVENOUS
  Administered 2019-02-10: 4 mg/h via INTRAVENOUS
  Administered 2019-02-10: 16 mg/h via INTRAVENOUS
  Administered 2019-02-10: 12 mg/h via INTRAVENOUS
  Administered 2019-02-10: 14 mg/h via INTRAVENOUS
  Filled 2019-02-09 (×2): qty 50
  Filled 2019-02-09: qty 100
  Filled 2019-02-09 (×2): qty 50
  Filled 2019-02-09: qty 100
  Filled 2019-02-09 (×6): qty 50
  Filled 2019-02-09: qty 100
  Filled 2019-02-09: qty 50

## 2019-02-09 MED ORDER — CEFAZOLIN SODIUM-DEXTROSE 1-4 GM/50ML-% IV SOLN: 1.0000 g | Freq: Four times a day (QID) | INTRAVENOUS | Status: DC

## 2019-02-09 MED ORDER — LIDOCAINE HCL 1 % IJ SOLN
INTRAMUSCULAR | Status: AC
  Filled 2019-02-09: qty 60

## 2019-02-09 MED ORDER — LIDOCAINE HCL (PF) 1 % IJ SOLN
INTRAMUSCULAR | Status: DC | PRN
  Administered 2019-02-09: 45 mL

## 2019-02-09 SURGICAL SUPPLY — 7 items
CABLE SURGICAL S-101-97-12 (CABLE) ×3 IMPLANT
LEAD TENDRIL MRI 46CM LPA1200M (Lead) ×2 IMPLANT
LEAD TENDRIL MRI 52CM LPA1200M (Lead) ×2 IMPLANT
PACEMAKER ASSURITY DR-RF (Pacemaker) ×2 IMPLANT
PAD PRO RADIOLUCENT 2001M-C (PAD) ×3 IMPLANT
SHEATH 8FR PRELUDE SNAP 13 (SHEATH) ×4 IMPLANT
TRAY PACEMAKER INSERTION (PACKS) ×3 IMPLANT

## 2019-02-09 NOTE — Progress Notes (Signed)
PHARMACY NOTE:  ANTIMICROBIAL RENAL DOSAGE ADJUSTMENT  Current antimicrobial regimen includes a mismatch between antimicrobial dosage and estimated renal function.  As per policy approved by the Pharmacy & Therapeutics and Medical Executive Committees, the antimicrobial dosage will be adjusted accordingly.  Current antimicrobial dosage:  Cefazolin 1g IV q6h x3  Indication: surgical ppx  Renal Function:  Estimated Creatinine Clearance: 9.5 mL/min (A) (by C-G formula based on SCr of 4.87 mg/dL (H)). []      On intermittent HD, scheduled: []      On CRRT    Antimicrobial dosage has been changed to:  discontinued  Additional comments: pt received one-time 2g cefazolin dose in EP lab which given renal function will cover patient prophylactically post-op   Thank you for allowing pharmacy to be a part of this patient's care.  Arrie Senate, PharmD, BCPS Clinical Pharmacist [phone] Please check AMION for all [place] numbers 02/09/2019

## 2019-02-09 NOTE — H&P (Signed)
Jeffrey Campbell has presented today for surgery, with the diagnosis of complete heart block.  The various methods of treatment have been discussed with the patient and family. After consideration of risks, benefits and other options for treatment, the patient has consented to  Procedure(s): Pacemaker implant as a surgical intervention .  Risks include but not limited to bleeding, tamponade, infection, pneumothorax, among others. The patient's history has been reviewed, patient examined, no change in status, stable for surgery.  I have reviewed the patient's chart and labs.  Questions were answered to the patient's satisfaction.    Letonia Stead Curt Bears, MD 02/09/2019 11:30 AM

## 2019-02-09 NOTE — Progress Notes (Signed)
Orthopedic Tech Progress Note Patient Details:  Jeffrey Campbell 01-Apr-1932 [id number] RN said patient does have on arm sling Patient ID: ATTHEW [name], male   DOB: Oct 12, 1932, 85 y.o.   MRN: [id number]   [name] 02/09/2019, 1:54 PM

## 2019-02-09 NOTE — Discharge Instructions (Signed)
Supplemental Discharge Instructions for  Defibrillator Patients  Activity No heavy lifting or vigorous activity with your left/right arm for 6 to 8 weeks.  Do not raise your left/right arm above your head for one week.  Gradually raise your affected arm as drawn below.             02/13/2019                02/14/2019             02/15/2019             02/16/2019 __  NO DRIVING pt does not drive.  WOUND CARE - Keep the wound area clean and dry.  Do not get this area wet, no showers until cleared to at your wound check visit . - The tape/steri-strips on your wound will fall off; do not pull them off.  No bandage is needed on the site.  DO  NOT apply any creams, oils, or ointments to the wound area. - If you notice any drainage or discharge from the wound, any swelling or bruising at the site, or you develop a fever > 101? F after you are discharged home, call the office at once.  Special Instructions - You are still able to use cellular telephones; use the ear opposite the side where you have your pacemaker/defibrillator.  Avoid carrying your cellular phone near your device. - When traveling through airports, show security personnel your identification card to avoid being screened in the metal detectors.  Ask the security personnel to use the hand wand. - Avoid arc welding equipment, MRI testing (magnetic resonance imaging), TENS units (transcutaneous nerve stimulators).  Call the office for questions about other devices. - Avoid electrical appliances that are in poor condition or are not properly grounded. - Microwave ovens are safe to be near or to operate.     Electrical Cardioversion, Care After This sheet gives you information about how to care for yourself after your procedure. Your health care provider may also give you more specific instructions. If you have problems or questions, contact your health care provider. What can I expect after the procedure? After the procedure,  it is common to have:  Some redness on the skin where the shocks were given. Follow these instructions at home:   Do not drive for 24 hours if you were given a medicine to help you relax (sedative).  Take over-the-counter and prescription medicines only as told by your health care provider.  Ask your health care provider how to check your pulse. Check it often.  Rest for 48 hours after the procedure or as told by your health care provider.  Avoid or limit your caffeine use as told by your health care provider. Contact a health care provider if:  You feel like your heart is beating too quickly or your pulse is not regular.  You have a serious muscle cramp that does not go away. Get help right away if:   You have discomfort in your chest.  You are dizzy or you feel faint.  You have trouble breathing or you are short of breath.  Your speech is slurred.  You have trouble moving an arm or leg on one side of your body.  Your fingers or toes turn cold or blue. This information is not intended to replace advice given to you by your health care provider. Make sure you discuss any questions you have with your health care provider. Document  Released: 01/03/2013 Document Revised: 02/25/2017 Document Reviewed: 09/19/2015 Elsevier Patient Education  2020 [employer].

## 2019-02-09 NOTE — Progress Notes (Addendum)
Progress Note  Patient Name: Jeffrey Campbell Date of Encounter: 02/09/2019  Primary Cardiologist: Fransico Him, MD  AHF: Dr. Aundra Dubin  Subjective   No complaints, slept well, no CP, palpitations, no lethargy, AAO x4 again today  Inpatient Medications    Scheduled Meds: . apixaban  2.5 mg Oral BID  . atorvastatin  20 mg Oral Daily  . calcitRIOL  0.25 mcg Oral Daily  . chlorhexidine  60 mL Topical Once  . Chlorhexidine Gluconate Cloth  6 each Topical Daily  . donepezil  10 mg Oral QHS  . doxazosin  4 mg Oral Daily  . gentamicin irrigation  80 mg Irrigation To Cath  . hydrALAZINE  100 mg Oral TID  . levothyroxine  150 mcg Oral QAC breakfast  . memantine  10 mg Oral BID  . sodium chloride flush  3 mL Intravenous Q12H  . sodium chloride flush  3 mL Intravenous Q12H   Continuous Infusions: . sodium chloride    . sodium chloride    . sodium chloride    .  ceFAZolin (ANCEF) IV    . clevidipine 6 mg/hr (02/09/19 0916)   PRN Meds: sodium chloride, acetaminophen, ondansetron (ZOFRAN) IV, sodium chloride flush, sodium chloride flush   Vital Signs    Vitals:   02/09/19 0800 02/09/19 0815 02/09/19 0830 02/09/19 0900  BP: (!) 161/62 (!) 144/58 (!) 162/59 (!) 157/56  Pulse: (!) 47 (!) 41 (!) 27 (!) 44  Resp: 17 16 17 15   Temp:      TempSrc:      SpO2: 99% 97% 98% 98%  Weight:        Intake/Output Summary (Last 24 hours) at 02/09/2019 0919 Last data filed at 02/09/2019 0900 Gross per 24 hour  Intake 389.7 ml  Output 370 ml  Net 19.7 ml   Last 3 Weights 02/09/2019 02/08/2019 02/08/2019  Weight (lbs) 130 lb 11.7 oz 136 lb 0.4 oz 145 lb  Weight (kg) 59.3 kg 61.7 kg 65.772 kg      Telemetry    CHB, V rates 30's-40, occ PVCs - Personally Reviewed  ECG    No new EKGs - Personally Reviewed  Physical Exam   GEN: No acute distress.   Neck: No JVD Cardiac: RRR, bradycardic, no murmurs, rubs, or gallops.  Respiratory: CTA b/l. GI: Soft, nontender, non-distended  MS:  No edema         Age appropriate edema, AV fistula L upper arm. Neuro:  Nonfocal  Psych: Normal affect   Labs    High Sensitivity Troponin:  No results for input(s): TROPONINIHS in the last 720 hours.    Chemistry Recent Labs  Lab 02/05/19 1138 02/08/19 1644 02/09/19 0228  NA 140 142 140  K 3.9 3.7 4.4  CL 103 113* 105  CO2 24 19* 20*  GLUCOSE 142* 94 110*  BUN 95* 84* 97*  CREATININE 5.11* 4.37* 4.87*  CALCIUM 10.1 8.7* 9.9  GFRNONAA 10* 12* 10*  GFRAA 11* 13* 12*  ANIONGAP 13 10 15      Hematology Recent Labs  Lab 02/05/19 1138 02/08/19 1644 02/09/19 0228  WBC 4.9 4.0 4.7  RBC 3.38* 3.02* 3.17*  HGB 10.9* 9.6* 10.2*  HCT 34.6* 30.8* 32.1*  MCV 102.4* 102.0* 101.3*  MCH 32.2 31.8 32.2  MCHC 31.5 31.2 31.8  RDW 14.8 14.7 14.8  PLT 128* 100* 102*    BNPNo results for input(s): BNP, PROBNP in the last 168 hours.   DDimer No results for input(s):  DDIMER in the last 168 hours.   Radiology    No results found.  Cardiac Studies   01/05/2019: TTE IMPRESSIONS 1. Patient appears to be in atrial flutter with slow ventricular rate, with pronounced fluttering of mitral valve seen on short axis and m mode. 2. Left ventricular ejection fraction, by visual estimation, is 55 to 60%. The left ventricle has normal function. Mildly increased left ventricular size. There is mildly increased left ventricular hypertrophy. 3. Left ventricular diastolic Doppler parameters are indeterminate pattern of LV diastolic filling. 4. Global right ventricle has moderately reduced systolic function.The right ventricular size is moderately enlarged. Mildly increased right ventricular wall thickness. 5. Left atrial size was severely dilated. 6. Right atrial size was severely dilated. 7. Small pericardial effusion. 8. Moderate calcification of the mitral valve leaflet(s). 9. Moderate thickening of the mitral valve leaflet(s). 10. The mitral valve is degenerative. Trace mitral valve  regurgitation. 11. The tricuspid valve is normal in structure. Tricuspid valve regurgitation is mild. 12. The aortic valve is tricuspid Aortic valve regurgitation is trivial by color flow Doppler. Mild aortic valve sclerosis without stenosis. 13. The pulmonic valve was normal in structure. Pulmonic valve regurgitation is trivial by color flow Doppler. 14. Severely elevated pulmonary artery systolic pressure. 15. The inferior vena cava is dilated in size with <50% respiratory variability, suggesting right atrial pressure of 15 mmHg. 16. Severe pulmonary hypertension, RVSP 72 mmHg.   07/28/16: RHC 1. Mildly elevated right and left heart filling pressures.  2. Preserved cardiac output.  3. Moderate pulmonary hypertension. PVR 1.74 WU, so suspect primarily pulmonary venous hypertension and possible component of PH from OSA. Would not treat with selective pulmonary vasodilators. Likely best treatment remains diuresis and CPAP.    CAD: LHC (2012) with 50-60% ostial LAD, 50-60% mLAD, 60% pRCA.  Cardiolite (4/16) with EF 58%, no ischemia or infarction.    Sinus bradycardia: Holter (10/16) with average HR 44, occasional junctional rhythm.   Patient Profile     85 y.o. male with a hx of CKD (III-IV), CAD (diffuse moderate CAD 2012), PVD, p.HTN, COPD (mild), chronic diastolic CHF, dementia, persistent AFlutter came in yesterday for outpt DCCV >> SR with CHB and admitted   Assessment & Plan    1. CHB, V rates 30's     Baseline conduction system disease     On Aricept though given dementia, would not anticipate stopping this agent     No other reversible causes noted  Longstanding bradycardia in sinus and in flutter   I discussed yesterday rational/indication for PPM, thr procedure, potential risks, benefits, the patient was agreeable I was able to reach his wife, but did speak to his daughter yesterday Genevieve Norlander (listed onhis DPR)  She stated they knew he was being admitted  and likely would get PPM  I discussed the indication for PPM with her as well as the procedure, potential risks and benefits, she was also agreeable and she plans to be here this morning.  The patient this AM reports he recalled our discussion yesterday, he has no follow up questions and remains agreeable to PPM implant  NOTE: 1. The pt has iodine listed as an allergy 2/2 renal failure...  Josias Tomerlin plan to avoid/try not to use any contrast Last few Creat 5.11 > 4.37 > 4.87 today  2. AVF L upper arm, [place] plan R sided implant  3. Persistent AFlutter DCCV yesterday on Eliquis, given cardioversion Rainee Sweatt not be able to interrupt a/c for his implant  2. Chronic diastolic CHF 3. Prominent RV failure 4. P.HTN     Appears compensated currently     Deferred to AHF team   5. Dementia     The patient is AAO self, place, time and situation again this am     [place] not stop his aricept  6. HTN ooc     Improving on cleviprex gtt as well as his PO meds,  last 155/59     Expect BP Terrence Pizana be able to be a bit easier to control post pacing  7. CKD (IV)     AHF  Team addressing meds      Follows with Dr. Justin Mend    For questions or updates, please contact [place] Please consult [email or web address] for contact info under        Signed, Baldwin Jamaica, PA-C  02/09/2019, 9:19 AM    I have seen and examined this patient with Tommye Standard.  Agree with above, note added to reflect my findings.  On exam, bradycardic, no murmurs, lungs clear. Patient with continued complete heart block. Plan for pacemaker implant.    Maleiya Pergola M. Jacquline Terrill MD 02/09/2019 11:29 AM

## 2019-02-09 NOTE — Progress Notes (Signed)
Dr. Sonia Side was called for BP medications since the patient's BP is in the 190s/160s. Order  received to start the patient on a Cleviprex drip.

## 2019-02-09 NOTE — Progress Notes (Addendum)
Advanced Heart Failure Rounding Note  PCP-Cardiologist: Fransico Him, MD   Subjective:    Remains in CHB w/ rates in the 30s- 40s but asymptomatic. A&Ox3. BP remains elevated and difficult to control overnight w/ hydralazine. Started on clevidipine ~4:30 am for hypertension w/ initial SBPs in the 200s. Current BP 578I systolic 696E diastolic.   K 4.4.  Bump in SCr overnight from 4.37>>4.87.    Objective:   Weight Range: 59.3 kg Body mass index is 19.88 kg/m.   Vital Signs:   Temp:  [97.6 F (36.4 C)-97.8 F (36.6 C)] 97.8 F (36.6 C) (11/12 2300) Pulse Rate:  [30-69] 41 (11/13 0630) Resp:  [10-21] 15 (11/13 0630) BP: (105-213)/(43-168) 157/141 (11/13 0630) SpO2:  [95 %-100 %] 97 % (11/13 0630) Weight:  [59.3 kg-65.8 kg] 59.3 kg (11/13 0434) Last BM Date: 02/07/19  Weight change: Filed Weights   02/08/19 1104 02/08/19 1736 02/09/19 0434  Weight: 65.8 kg 61.7 kg 59.3 kg    Intake/Output:   Intake/Output Summary (Last 24 hours) at 02/09/2019 0707 Last data filed at 02/09/2019 0241 Gross per 24 hour  Intake 370 ml  Output 370 ml  Net 0 ml      Physical Exam   PHYSICAL EXAM: General:  Thin elderly AAM. No respiratory difficulty HEENT: normal Neck: supple. no JVD. Carotids 2+ bilat; no bruits. No lymphadenopathy or thyromegaly appreciated. Cor: PMI nondisplaced. Irregular rhythm, slow rate. No rubs, gallops or murmurs. Lungs: clear Abdomen: soft, nontender, nondistended. No hepatosplenomegaly. No bruits or masses. Good bowel sounds. Extremities: no cyanosis, clubbing, rash, edema Neuro: alert & oriented x 3, cranial nerves grossly intact. moves all 4 extremities w/o difficulty. Affect pleasant.   Telemetry   Compete heart block, rates in the 30s-40s  EKG    No new EKG to review today   Labs    CBC Recent Labs    02/08/19 1644 02/09/19 0228  WBC 4.0 4.7  NEUTROABS  --  3.2  HGB 9.6* 10.2*  HCT 30.8* 32.1*  MCV 102.0* 101.3*  PLT 100*  952*   Basic Metabolic Panel Recent Labs    02/08/19 1644 02/09/19 0228  NA 142 140  K 3.7 4.4  CL 113* 105  CO2 19* 20*  GLUCOSE 94 110*  BUN 84* 97*  CREATININE 4.37* 4.87*  CALCIUM 8.7* 9.9   Liver Function Tests No results for input(s): AST, ALT, ALKPHOS, BILITOT, PROT, ALBUMIN in the last 72 hours. No results for input(s): LIPASE, AMYLASE in the last 72 hours. Cardiac Enzymes No results for input(s): CKTOTAL, CKMB, CKMBINDEX, TROPONINI in the last 72 hours.  BNP: BNP (last 3 results) Recent Labs    05/20/18 1933  BNP 3,913.9*    ProBNP (last 3 results) Recent Labs    01/09/19 1019  PROBNP 21,225*     D-Dimer No results for input(s): DDIMER in the last 72 hours. Hemoglobin A1C No results for input(s): HGBA1C in the last 72 hours. Fasting Lipid Panel No results for input(s): CHOL, HDL, LDLCALC, TRIG, CHOLHDL, LDLDIRECT in the last 72 hours. Thyroid Function Tests No results for input(s): TSH, T4TOTAL, T3FREE, THYROIDAB in the last 72 hours.  Invalid input(s): FREET3  Other results:   Imaging     No results found.   Medications:     Scheduled Medications: . apixaban  2.5 mg Oral BID  . atorvastatin  20 mg Oral Daily  . calcitRIOL  0.25 mcg Oral Daily  . chlorhexidine  60 mL Topical Once  .  donepezil  10 mg Oral QHS  . doxazosin  4 mg Oral Daily  . gentamicin irrigation  80 mg Irrigation To Cath  . hydrALAZINE  100 mg Oral TID  . levothyroxine  150 mcg Oral QAC breakfast  . memantine  10 mg Oral BID  . sodium chloride flush  3 mL Intravenous Q12H  . sodium chloride flush  3 mL Intravenous Q12H     Infusions: . sodium chloride    . sodium chloride    . sodium chloride    .  ceFAZolin (ANCEF) IV    . clevidipine 1 mg/hr (02/09/19 0433)     PRN Medications:  sodium chloride, acetaminophen, ondansetron (ZOFRAN) IV, sodium chloride flush, sodium chloride flush    Patient Profile   Jeffrey Campbell is an 85 year old with a history of  CAD, PAD, CKD Stage IV, sinus bradycardia, dementia, chronic diastolic heart failure, pulmonary HTN and recent diagnosis of atrial flutter. He presented for DCCV 11/12, HR was around 40 in atrial flutter initially.  After DCCV, he was in CHB with escape rate upper 30s. BP normotensive>>high.   Assessment/Plan   1. Complete heart block: Patient has a long history of sinus bradycardia in 40s-50s, recently noted to be in atrial flutter with worsening HF.  Patient was cardioverted out of slow atrial flutter 11/12 with underlying complete heart block, escape rate in 30s.  BP stable.  He is on Aricept for dementia, otherwise no nodal blockers.   - remains in CHB currently and asymptomatic. No associated hypotension. - Discussed with EP, plan for PPM today 2. Pulmonary hypertension: Noted by echo in 2016 with PASP 68 mmHg and moderately dilated RV. PFTs showed mild obstruction with moderate to severely decreased DLCO, suggesting a pulmonary vascular problem. CT chest in 2/16 did not show definitive ILD. V/Q scan did not show evidence for acute or chronic PE. He carries a history of OSA but is not using CPAP. RHC showed mild pulmonary venous hypertension, PVR was only 1.8 WU. He had echo in 12/17 with PASP 77 mmHg but RHC again in 5/18 showed pulmonary venous hypertension. Suspect he has significant diastolic CHF. Most recent echo in 10/20 showed PASP 72 mmHg.  - Treatment will be diuresis, would avoid selective pulmonary vasodilators given primarily pulmonary venous hypertension.  3. Chronic diastolic CHF: With prominent RV failure. Patient has diastolic LV failure with pulmonary venous HTN on prior RHC. CKD stage IV increases tendency to retain fluid. PYP scan in 10/20 was not suggestive of ATTR amyloidosis.Atrial flutter may have worsened volume retention.  Recently transitioned from Lasix to torsemide, volume status ok today. Wt down from 136>>130 lb. Last outpatient creatinine was higher at  5.11.  -SCr 4.37 on admit -Torsemide on hold  for now pending procedure. Will likely resume at 60 mg daily after PPM placement.  - Wears compression stockings during the day. 4. CKD: Stage IV. Closely followed by Dr. Justin Mend with the thought that he may need HD soon. Recent creatinine up to 5.1 - SCr 4.37 on admit, increased to 4.87 overnight   -  Losartan has been discontinued.  - Torsemide on hold for pending PPM implant. 5. HTN: Suspect bilateral renal artery stenosis plays a role.  - Based on review of notes it appears that intervention on renal arteries has been decided against.  - BP elevated today. Has been started on IV clevidipine overnight - continue Hydralazine 100 tid - home doxazosin to be resumed, 4 mg daily 6. PAD:  Renal artery stenosis, mesenteric artery stenosis, distal aortic stenosis. - Continue statin.  7. CAD: Stable with no chest pain. Continue statin. Goal LDL <70. 4/16 Cardiolite with no ischemia. No ASA given need for anticoagulation.  8. Atrial flutter: He remains on apixaban at 2.5 mg bid, now out of flutter s/p DCCV.  9. Hypothyroidism: on levothyroxine.  -Given brady arrhythmia, will check TSH   Length of Stay: 1  Brittainy Simmons, PA-C  02/09/2019, 7:07 AM  Advanced Heart Failure Team Pager [phone] (M-F; 7a - 4p)  Please contact [facility] for night-coverage after hours (4p -7a ) and weekends on [email or web address]  Patient seen with PA, agree with the above note.   Currently, he is in sinus brady with rate 40, but earlier this morning was in CHB.  No complaints this morning.    BP markedly high this morning and clevidipine gtt started, now BP 170/64 (improved).    General: NAD Neck: No JVD, no thyromegaly or thyroid nodule.  Lungs: Clear to auscultation bilaterally with normal respiratory effort. CV: Nondisplaced PMI.  Heart regular S1/S2, no S3/S4, 2/6 SEM RUSB.  No peripheral edema.   Abdomen: Soft, nontender, no hepatosplenomegaly, no  distention.  Skin: Intact without lesions or rashes.  Neurologic: Alert and oriented x 3.  Psych: Normal affect. Extremities: No clubbing or cyanosis.  HEENT: Normal.   Volume status looks ok (better than when seen in clinic).  With creatinine up to 4.87 today, continue to hold torsemide.  Will restart eventually at 60 mg daily.   BP improved on clevidipine gtt.  Continue for now but will titrate off after PPM placed.  Restart home hydralazine and doxazosin. He will not resume losartan.   S/p DCCV from atrial flutter, remains on apixaban 2.5 mg bid.   He is currently in sinus brady with 1st degree AVB but had CHB earlier this morning.  Plan for PPM today.   Loralie Champagne 02/09/2019 8:01 AM

## 2019-02-10 ENCOUNTER — Inpatient Hospital Stay (HOSPITAL_COMMUNITY): Payer: Medicare Other

## 2019-02-10 DIAGNOSIS — I10 Essential (primary) hypertension: Secondary | ICD-10-CM

## 2019-02-10 DIAGNOSIS — I272 Pulmonary hypertension, unspecified: Secondary | ICD-10-CM

## 2019-02-10 DIAGNOSIS — N184 Chronic kidney disease, stage 4 (severe): Secondary | ICD-10-CM

## 2019-02-10 LAB — BASIC METABOLIC PANEL
Anion gap: 15 (ref 5–15)
BUN: 97 mg/dL — ABNORMAL HIGH (ref 8–23)
CO2: 20 mmol/L — ABNORMAL LOW (ref 22–32)
Calcium: 9.6 mg/dL (ref 8.9–10.3)
Chloride: 101 mmol/L (ref 98–111)
Creatinine, Ser: 5.25 mg/dL — ABNORMAL HIGH (ref 0.61–1.24)
GFR calc Af Amer: 11 mL/min — ABNORMAL LOW (ref >60)
GFR calc non Af Amer: 9 mL/min — ABNORMAL LOW (ref >60)
Glucose, Bld: 117 mg/dL — ABNORMAL HIGH (ref 70–99)
Potassium: 4.2 mmol/L (ref 3.5–5.1)
Sodium: 136 mmol/L (ref 135–145)

## 2019-02-10 LAB — CBC WITH DIFFERENTIAL/PLATELET
Abs Immature Granulocytes: 0.03 10*3/uL (ref 0.00–0.07)
Basophils Absolute: 0 10*3/uL (ref 0.0–0.1)
Basophils Relative: 1 %
Eosinophils Absolute: 0.3 10*3/uL (ref 0.0–0.5)
Eosinophils Relative: 4 %
HCT: 32.3 % — ABNORMAL LOW (ref 39.0–52.0)
Hemoglobin: 10.4 g/dL — ABNORMAL LOW (ref 13.0–17.0)
Immature Granulocytes: 1 %
Lymphocytes Relative: 14 %
Lymphs Abs: 0.8 10*3/uL (ref 0.7–4.0)
MCH: 31.8 pg (ref 26.0–34.0)
MCHC: 32.2 g/dL (ref 30.0–36.0)
MCV: 98.8 fL (ref 80.0–100.0)
Monocytes Absolute: 0.3 10*3/uL (ref 0.1–1.0)
Monocytes Relative: 6 %
Neutro Abs: 4.3 10*3/uL (ref 1.7–7.7)
Neutrophils Relative %: 74 %
Platelets: 105 10*3/uL — ABNORMAL LOW (ref 150–400)
RBC: 3.27 MIL/uL — ABNORMAL LOW (ref 4.22–5.81)
RDW: 14.7 % (ref 11.5–15.5)
WBC: 5.7 10*3/uL (ref 4.0–10.5)
nRBC: 0 % (ref 0.0–0.2)

## 2019-02-10 LAB — TRIGLYCERIDES: Triglycerides: 94 mg/dL (ref <150)

## 2019-02-10 MED ORDER — AMLODIPINE BESYLATE 10 MG PO TABS
10.0000 mg | ORAL_TABLET | Freq: Every day | ORAL | Status: DC
  Administered 2019-02-10 – 2019-02-12 (×3): 10 mg via ORAL
  Filled 2019-02-10 (×3): qty 1

## 2019-02-10 MED ORDER — CLEVIDIPINE BUTYRATE 0.5 MG/ML IV EMUL
0.0000 mg/h | INTRAVENOUS | Status: DC
  Administered 2019-02-10: 4 mg/h via INTRAVENOUS
  Filled 2019-02-10 (×2): qty 50

## 2019-02-10 MED ORDER — CLONIDINE HCL 0.1 MG PO TABS
0.1000 mg | ORAL_TABLET | ORAL | Status: AC
  Administered 2019-02-10: 0.1 mg via ORAL
  Filled 2019-02-10: qty 1

## 2019-02-10 NOTE — Progress Notes (Signed)
Patient ID: Jeffrey Campbell, male   DOB: Aug 23, 1932, 85 y.o.   MRN: [id number]     Advanced Heart Failure Rounding Note  PCP-Cardiologist: Fransico Him, MD   Subjective:    St Jude PPM placed 11/14 for CHB.  He is stable this morning.  He remains on clevidipine gtt with SBP 150s.   Rise in creatinine from 4.37>>4.87>>5.25.    Objective:   Weight Range: 62.5 kg Body mass index is 20.95 kg/m.   Vital Signs:   Temp:  [96.9 F (36.1 C)-98.7 F (37.1 C)] 98.7 F (37.1 C) (11/14 0813) Pulse Rate:  [0-109] 60 (11/14 0645) Resp:  [10-26] 16 (11/14 0645) BP: (133-177)/(49-78) 151/61 (11/14 0645) SpO2:  [0 %-100 %] 100 % (11/14 0645) Weight:  [59.3 kg-62.5 kg] 62.5 kg (11/14 0500) Last BM Date: 02/07/19  Weight change: Filed Weights   02/09/19 0434 02/09/19 1158 02/10/19 0500  Weight: 59.3 kg 59.3 kg 62.5 kg    Intake/Output:   Intake/Output Summary (Last 24 hours) at 02/10/2019 1020 Last data filed at 02/10/2019 0600 Gross per 24 hour  Intake 567.67 ml  Output 275 ml  Net 292.67 ml      Physical Exam   General: NAD Neck: JVP 8 cm, no thyromegaly or thyroid nodule.  Lungs: Clear to auscultation bilaterally with normal respiratory effort. CV: Nondisplaced PMI.  Heart regular S1/S2, no S3/S4, 2/6 SEM RUSB.  No peripheral edema.   Abdomen: Soft, nontender, no hepatosplenomegaly, no distention.  Skin: Intact without lesions or rashes.  Neurologic: Alert and oriented x 3.  Psych: Normal affect. Extremities: No clubbing or cyanosis.  HEENT: Normal.    Telemetry   A-V sequential pacing 60 (personally reviewed).   Labs    CBC Recent Labs    02/09/19 0228 02/10/19 0444  WBC 4.7 5.7  NEUTROABS 3.2 4.3  HGB 10.2* 10.4*  HCT 32.1* 32.3*  MCV 101.3* 98.8  PLT 102* 175*   Basic Metabolic Panel Recent Labs    02/09/19 0228 02/09/19 0827 02/10/19 0444  NA 140  --  136  K 4.4  --  4.2  CL 105  --  101  CO2 20*  --  20*  GLUCOSE 110*  --  117*  BUN 97*  --   97*  CREATININE 4.87*  --  5.25*  CALCIUM 9.9  --  9.6  MG  --  2.2  --    Liver Function Tests No results for input(s): AST, ALT, ALKPHOS, BILITOT, PROT, ALBUMIN in the last 72 hours. No results for input(s): LIPASE, AMYLASE in the last 72 hours. Cardiac Enzymes No results for input(s): CKTOTAL, CKMB, CKMBINDEX, TROPONINI in the last 72 hours.  BNP: BNP (last 3 results) Recent Labs    05/20/18 1933  BNP 3,913.9*    ProBNP (last 3 results) Recent Labs    01/09/19 1019  PROBNP 21,225*     D-Dimer No results for input(s): DDIMER in the last 72 hours. Hemoglobin A1C No results for input(s): HGBA1C in the last 72 hours. Fasting Lipid Panel Recent Labs    02/10/19 0444  TRIG 94   Thyroid Function Tests Recent Labs    02/09/19 0827  TSH 24.079*    Other results:   Imaging    Dg Chest 2 View  Result Date: 02/10/2019 CLINICAL DATA:  Status post ICD insertion. EXAM: CHEST - 2 VIEW COMPARISON:  05/20/2018 FINDINGS: Interval right chest wall ICD placement with leads in the right atrial appendage and right ventricle. No  pneumothorax. Stable mild cardiac enlargement and aortic atherosclerosis. Mild pulmonary edema and bilateral pleural effusions identified. IMPRESSION: 1. No complications status post ICD placement. 2. Mild CHF/fluid overload. Electronically Signed   By: Kerby Moors M.D.   On: 02/10/2019 05:59     Medications:     Scheduled Medications: . amLODipine  10 mg Oral Daily  . apixaban  2.5 mg Oral BID  . atorvastatin  20 mg Oral Daily  . calcitRIOL  0.25 mcg Oral Daily  . Chlorhexidine Gluconate Cloth  6 each Topical Daily  . donepezil  10 mg Oral QHS  . doxazosin  4 mg Oral Daily  . hydrALAZINE  100 mg Oral TID  . influenza vaccine adjuvanted  0.5 mL Intramuscular Tomorrow-1000  . levothyroxine  150 mcg Oral QAC breakfast  . memantine  10 mg Oral BID  . pneumococcal 23 valent vaccine  0.5 mL Intramuscular Tomorrow-1000  . sodium chloride flush   3 mL Intravenous Q12H  . sodium chloride flush  3 mL Intravenous Q12H    Infusions: . sodium chloride 10 mL/hr at 02/10/19 0600    PRN Medications: sodium chloride, acetaminophen, ondansetron (ZOFRAN) IV, sodium chloride flush    Patient Profile   Jeffrey Campbell is an 85 year old with a history of CAD, PAD, CKD Stage IV, sinus bradycardia, dementia, chronic diastolic heart failure, pulmonary HTN and recent diagnosis of atrial flutter. He presented for DCCV 11/12, HR was around 40 in atrial flutter initially.  After DCCV, he was in CHB with escape rate upper 30s. BP normotensive>>high.   Assessment/Plan   1. Complete heart block: Patient has a long history of sinus bradycardia in 40s-50s, recently noted to be in atrial flutter with worsening HF.  Patient was cardioverted out of slow atrial flutter 11/12 with underlying complete heart block, escape rate in 30s.  BP stable.  He is on Aricept for dementia, otherwise no nodal blockers.  Now s/p St Jude dual chamber PPM.  2. Pulmonary hypertension: Noted by echo in 2016 with PASP 68 mmHg and moderately dilated RV. PFTs showed mild obstruction with moderate to severely decreased DLCO, suggesting a pulmonary vascular problem. CT chest in 2/16 did not show definitive ILD. V/Q scan did not show evidence for acute or chronic PE. He carries a history of OSA but is not using CPAP. RHC showed mild pulmonary venous hypertension, PVR was only 1.8 WU. He had echo in 12/17 with PASP 77 mmHg but RHC again in 5/18 showed pulmonary venous hypertension. Suspect he has significant diastolic CHF. Most recent echo in 10/20 showed PASP 72 mmHg.  - Treatment will be diuresis, would avoid selective pulmonary vasodilators given primarily pulmonary venous hypertension.  3. Chronic diastolic CHF: With prominent RV failure. Patient has diastolic LV failure with pulmonary venous HTN on prior RHC. CKD stage IV increases tendency to retain fluid. PYP scan in 10/20 was  not suggestive of ATTR amyloidosis.Atrial flutter may have worsened volume retention.  Recently transitioned from Lasix to torsemide, volume status ok today despite holding torsemide.  Creatinine up higher to 5.25.  -Hold torsemide again today, hopefully restart tomorrow.  4. CKD: Stage IV. Closely followed by Dr. Justin Mend with the thought that he may need HD soon. Recent creatinine up to 5.1 SCr 4.37 on admit, increased to 5.25 today.  Torsemide has been held and losartan stopped.   - Follow BMET closely.  5. HTN: Suspect bilateral renal artery stenosis plays a role.  Based on review of notes it appears  that intervention on renal arteries has been decided against. Still on IV clevidipine.  - Stop clevidipine today, start back on home amlodipine 10 mg daily.  - continue Hydralazine 100 tid - home doxazosin to be resumed, 4 mg daily 6. PAD: Renal artery stenosis, mesenteric artery stenosis, distal aortic stenosis. - Continue statin.  7. CAD: Stable with no chest pain. Continue statin. Goal LDL <70. 4/16 Cardiolite with no ischemia. No ASA given need for anticoagulation.  8. Atrial flutter: He remains on apixaban at 2.5 mg bid, now out of flutter s/p DCCV.  9. Hypothyroidism: on levothyroxine.   Out of bed, ambulate today.  Can go to step down.  Home tomorrow if creatinine stable and BP controlled off clevidipine gtt.   Length of Stay: 2  Loralie Champagne, MD  02/10/2019, 10:20 AM  Advanced Heart Failure Team Pager [phone] (M-F; [place])  Please contact [facility] for night-coverage after hours (4p -7a ) and weekends on [email or web address]

## 2019-02-10 NOTE — Progress Notes (Signed)
CARDIAC REHAB PHASE I   PRE:  Rate/Rhythm: 60 pacing    BP: sitting 174/67    SaO2: 98 RA  MODE:  Ambulation: 290 ft   POST:  Rate/Rhythm: 64 pacing    BP: sitting 175/60     SaO2: 98 RA  Pt willing to walk. He had ambulated with RN earlier. Moved to EOB and stood independently. Walked with RW, steady. Pt is unaware of his PPM restrictions. Does st his right elbow is bothering him. No c/o walking. To recliner. BP remained the same before and after walking. Pt will need supervision at home due to dementia. [place], ACSM 02/10/2019 1:59 PM

## 2019-02-10 NOTE — Progress Notes (Signed)
Progress Note  Patient Name: Jeffrey Campbell Date of Encounter: 02/10/2019  Primary Cardiologist: Fransico Him, MD  AHF: Dr. Aundra Dubin  Subjective   Mildly agitated this AM. Pacemaker implanted yesterday.   Inpatient Medications    Scheduled Meds: . apixaban  2.5 mg Oral BID  . atorvastatin  20 mg Oral Daily  . calcitRIOL  0.25 mcg Oral Daily  . Chlorhexidine Gluconate Cloth  6 each Topical Daily  . donepezil  10 mg Oral QHS  . doxazosin  4 mg Oral Daily  . hydrALAZINE  100 mg Oral TID  . influenza vaccine adjuvanted  0.5 mL Intramuscular Tomorrow-1000  . levothyroxine  150 mcg Oral QAC breakfast  . memantine  10 mg Oral BID  . pneumococcal 23 valent vaccine  0.5 mL Intramuscular Tomorrow-1000  . sodium chloride flush  3 mL Intravenous Q12H  . sodium chloride flush  3 mL Intravenous Q12H   Continuous Infusions: . sodium chloride 10 mL/hr at 02/10/19 0600  . clevidipine 16 mg/hr (02/10/19 0644)   PRN Meds: sodium chloride, acetaminophen, ondansetron (ZOFRAN) IV, sodium chloride flush   Vital Signs    Vitals:   02/10/19 0615 02/10/19 0630 02/10/19 0645 02/10/19 0813  BP: (!) 162/65 (!) 146/61 (!) 151/61   Pulse: 60 (!) 59 60   Resp: 15 14 16    Temp:    98.7 F (37.1 C)  TempSrc:    Oral  SpO2: 100% 97% 100%   Weight:      Height:        Intake/Output Summary (Last 24 hours) at 02/10/2019 0912 Last data filed at 02/10/2019 0600 Gross per 24 hour  Intake 579.03 ml  Output 275 ml  Net 304.03 ml   Last 3 Weights 02/10/2019 02/09/2019 02/09/2019  Weight (lbs) 137 lb 12.6 oz 130 lb 11.7 oz 130 lb 11.7 oz  Weight (kg) 62.5 kg 59.3 kg 59.3 kg      Telemetry    AV paced - Personally Reviewed  ECG    AV paced - Personally Reviewed  Physical Exam   GEN: Well nourished, well developed, in no acute distress  HEENT: normal  Neck: no JVD, carotid bruits, or masses Cardiac: RRR; 2/6 systolic murmur at the base, no rubs, or gallops,no edema  Respiratory:   clear to auscultation bilaterally, normal work of breathing GI: soft, nontender, nondistended, + BS MS: no deformity or atrophy  Skin: warm and dry, device site well healed Neuro:  Strength and sensation are intact Psych: euthymic mood, full affect   Labs    High Sensitivity Troponin:  No results for input(s): TROPONINIHS in the last 720 hours.    Chemistry Recent Labs  Lab 02/08/19 1644 02/09/19 0228 02/10/19 0444  NA 142 140 136  K 3.7 4.4 4.2  CL 113* 105 101  CO2 19* 20* 20*  GLUCOSE 94 110* 117*  BUN 84* 97* 97*  CREATININE 4.37* 4.87* 5.25*  CALCIUM 8.7* 9.9 9.6  GFRNONAA 12* 10* 9*  GFRAA 13* 12* 11*  ANIONGAP 10 15 15      Hematology Recent Labs  Lab 02/08/19 1644 02/09/19 0228 02/10/19 0444  WBC 4.0 4.7 5.7  RBC 3.02* 3.17* 3.27*  HGB 9.6* 10.2* 10.4*  HCT 30.8* 32.1* 32.3*  MCV 102.0* 101.3* 98.8  MCH 31.8 32.2 31.8  MCHC 31.2 31.8 32.2  RDW 14.7 14.8 14.7  PLT 100* 102* 105*    BNPNo results for input(s): BNP, PROBNP in the last 168 hours.   DDimer  No results for input(s): DDIMER in the last 168 hours.   Radiology    Dg Chest 2 View  Result Date: 02/10/2019 CLINICAL DATA:  Status post ICD insertion. EXAM: CHEST - 2 VIEW COMPARISON:  05/20/2018 FINDINGS: Interval right chest wall ICD placement with leads in the right atrial appendage and right ventricle. No pneumothorax. Stable mild cardiac enlargement and aortic atherosclerosis. Mild pulmonary edema and bilateral pleural effusions identified. IMPRESSION: 1. No complications status post ICD placement. 2. Mild CHF/fluid overload. Electronically Signed   By: Kerby Moors M.D.   On: 02/10/2019 05:59    Cardiac Studies   01/05/2019: TTE IMPRESSIONS 1. Patient appears to be in atrial flutter with slow ventricular rate, with pronounced fluttering of mitral valve seen on short axis and m mode. 2. Left ventricular ejection fraction, by visual estimation, is 55 to 60%. The left ventricle has normal  function. Mildly increased left ventricular size. There is mildly increased left ventricular hypertrophy. 3. Left ventricular diastolic Doppler parameters are indeterminate pattern of LV diastolic filling. 4. Global right ventricle has moderately reduced systolic function.The right ventricular size is moderately enlarged. Mildly increased right ventricular wall thickness. 5. Left atrial size was severely dilated. 6. Right atrial size was severely dilated. 7. Small pericardial effusion. 8. Moderate calcification of the mitral valve leaflet(s). 9. Moderate thickening of the mitral valve leaflet(s). 10. The mitral valve is degenerative. Trace mitral valve regurgitation. 11. The tricuspid valve is normal in structure. Tricuspid valve regurgitation is mild. 12. The aortic valve is tricuspid Aortic valve regurgitation is trivial by color flow Doppler. Mild aortic valve sclerosis without stenosis. 13. The pulmonic valve was normal in structure. Pulmonic valve regurgitation is trivial by color flow Doppler. 14. Severely elevated pulmonary artery systolic pressure. 15. The inferior vena cava is dilated in size with <50% respiratory variability, suggesting right atrial pressure of 15 mmHg. 16. Severe pulmonary hypertension, RVSP 72 mmHg.   07/28/16: RHC 1. Mildly elevated right and left heart filling pressures.  2. Preserved cardiac output.  3. Moderate pulmonary hypertension. PVR 1.74 WU, so suspect primarily pulmonary venous hypertension and possible component of PH from OSA. Would not treat with selective pulmonary vasodilators. Likely best treatment remains diuresis and CPAP.    CAD: LHC (2012) with 50-60% ostial LAD, 50-60% mLAD, 60% pRCA.  Cardiolite (4/16) with EF 58%, no ischemia or infarction.    Sinus bradycardia: Holter (10/16) with average HR 44, occasional junctional rhythm.   Patient Profile     85 y.o. male with a hx of CKD (III-IV), CAD (diffuse moderate CAD  2012), PVD, p.HTN, COPD (mild), chronic diastolic CHF, dementia, persistent AFlutter came in yesterday for outpt DCCV >> SR with CHB and admitted   Assessment & Plan    1. CHB, V rates 30's   St. Jude dual chamber pacemaker implanted yesterday. Device functioning appropriately. CXR and interrogation without issues. EP follow up has been arranged. Teaching given but Marlaine Arey need to discuss with his wife. EP to sign off  3. Persistent AFlutter: on eliquis post DCCV. AV paced.    2. Chronic diastolic CHF 3. Prominent RV failure 4. P.HTN     well compensated   5. Dementia Continue Aricept   6. HTN ooc BP remains elevated. Plan per HF.  7. CKD (IV)     For questions or updates, please contact [facility] Please consult [email or web address] for contact info under        Signed, Hettie Roselli Meredith Leeds, MD  02/10/2019, 9:12 AM  I have seen and examined this patient with Tommye Standard.  Agree with above, note added to reflect my findings.  On exam, bradycardic, no murmurs, lungs clear. Patient with continued complete heart block. Plan for pacemaker implant.    Norval Slaven M. Kaito Schulenburg MD 02/10/2019 9:12 AM

## 2019-02-10 NOTE — Progress Notes (Signed)
Pts post op pacemaker right arm sling removed by Dr. Aundra Dubin at bedside. Verbal orders for sling to remain off due to pt taking sling off and unable to follow sling instructions.

## 2019-02-10 NOTE — Evaluation (Signed)
Physical Therapy Evaluation Patient Details Name: EMANUAL LAMOUNTAIN MRN: [id number] DOB: 12-30-1932 Today's Date: 02/10/2019   History of Present Illness  Pt is and 85 y/o male admitted for surgery for complete heart block, s/p pacemaker placement 11/13.  PMHx:  pulm HTN, OSA, MVP, memory loss, Graves ds, glaucoma, CAD, CKD.  Clinical Impression  Pt admitted with/for pacemaker placement due to complete heart block.  Pt needing min guard for safety and mild instability.  Pt currently limited functionally due to the problems listed below.  (see problems list.)  Pt will benefit from PT to maximize function and safety to be able to get home safely with available assist of his wife.     Follow Up Recommendations No PT follow up    Equipment Recommendations  None recommended by PT    Recommendations for Other Services       Precautions / Restrictions Precautions Precautions: ICD/Pacemaker Restrictions RUE Weight Bearing: Non weight bearing      Mobility  Bed Mobility Overal bed mobility: Needs Assistance Bed Mobility: Supine to Sit;Sit to Supine     Supine to sit: Min guard Sit to supine: Min guard   General bed mobility comments: min guard/supervision for pacer precautions  Transfers Overall transfer level: Needs assistance   Transfers: Sit to/from Stand Sit to Stand: Min guard            Ambulation/Gait Ambulation/Gait assistance: Min guard Gait Distance (Feet): 300 Feet Assistive device: Rolling walker (2 wheeled);None Gait Pattern/deviations: Step-through pattern Gait velocity: moderate   General Gait Details: mildly unsteady with/without AD, likely AD would be troublesome for him in tight spaces in the home.  Stairs            Wheelchair Mobility    Modified Rankin (Stroke Patients Only)       Balance Overall balance assessment: Mild deficits observed, not formally tested                                           Pertinent  Vitals/Pain Pain Assessment: Faces Faces Pain Scale: No hurt Pain Intervention(s): Monitored during session    Home Living Family/patient expects to be discharged to:: Private residence Living Arrangements: Spouse/significant other Available Help at Discharge: Family;Available 24 hours/day Type of Home: House Home Access: Level entry     Home Layout: One level Home Equipment: Cane - single point;Shower seat;Walker - 2 wheels      Prior Function Level of Independence: Needs assistance   Gait / Transfers Assistance Needed: modified independent with gait. Reports he doesn't use walker           Hand Dominance   Dominant Hand: Right    Extremity/Trunk Assessment   Upper Extremity Assessment Upper Extremity Assessment: Overall WFL for tasks assessed    Lower Extremity Assessment Lower Extremity Assessment: Overall WFL for tasks assessed       Communication   Communication: No difficulties  Cognition Arousal/Alertness: Awake/alert Behavior During Therapy: Restless Overall Cognitive Status: History of cognitive impairments - at baseline                                        General Comments General comments (skin integrity, edema, etc.): Discussed pacer precaution with demonstration, but repetition and education with spouse needed.  Exercises     Assessment/Plan    PT Assessment Patient needs continued PT services  PT Problem List Decreased mobility;Decreased safety awareness;Decreased knowledge of precautions       PT Treatment Interventions Gait training;Functional mobility training;Therapeutic activities;Patient/family education    PT Goals (Current goals can be found in the Care Plan section)  Acute Rehab PT Goals Patient Stated Goal: pt unable PT Goal Formulation: Patient unable to participate in goal setting Time For Goal Achievement: 02/17/19 Potential to Achieve Goals: Good    Frequency Min 3X/week   Barriers to discharge         Co-evaluation               AM-PAC PT "6 Clicks" Mobility  Outcome Measure Help needed turning from your back to your side while in a flat bed without using bedrails?: A Little Help needed moving from lying on your back to sitting on the side of a flat bed without using bedrails?: A Little Help needed moving to and from a bed to a chair (including a wheelchair)?: A Little Help needed standing up from a chair using your arms (e.g., wheelchair or bedside chair)?: A Little Help needed to walk in hospital room?: A Little Help needed climbing 3-5 steps with a railing? : A Little 6 Click Score: 18    End of Session   Activity Tolerance: Patient tolerated treatment well Patient left: in bed;with call bell/phone within reach;with bed alarm set Nurse Communication: Mobility status PT Visit Diagnosis: Unsteadiness on feet (R26.81)    Time: 1815-1840 PT Time Calculation (min) (ACUTE ONLY): 25 min   Charges:   PT Evaluation $PT Eval Low Complexity: 1 Low PT Treatments $Gait Training: 8-22 mins        02/10/2019  Donnella Sham, PT Acute Rehabilitation Services [phone]  (pager) [phone]  (office)  Tessie Fass Jamiel Goncalves 02/10/2019, 6:53 PM

## 2019-02-11 LAB — BASIC METABOLIC PANEL
Anion gap: 14 (ref 5–15)
BUN: 100 mg/dL — ABNORMAL HIGH (ref 8–23)
CO2: 20 mmol/L — ABNORMAL LOW (ref 22–32)
Calcium: 9.9 mg/dL (ref 8.9–10.3)
Chloride: 102 mmol/L (ref 98–111)
Creatinine, Ser: 5.13 mg/dL — ABNORMAL HIGH (ref 0.61–1.24)
GFR calc Af Amer: 11 mL/min — ABNORMAL LOW (ref >60)
GFR calc non Af Amer: 10 mL/min — ABNORMAL LOW (ref >60)
Glucose, Bld: 121 mg/dL — ABNORMAL HIGH (ref 70–99)
Potassium: 4.6 mmol/L (ref 3.5–5.1)
Sodium: 136 mmol/L (ref 135–145)

## 2019-02-11 LAB — CBC WITH DIFFERENTIAL/PLATELET
Abs Immature Granulocytes: 0.04 10*3/uL (ref 0.00–0.07)
Basophils Absolute: 0 10*3/uL (ref 0.0–0.1)
Basophils Relative: 0 %
Eosinophils Absolute: 0.2 10*3/uL (ref 0.0–0.5)
Eosinophils Relative: 2 %
HCT: 30 % — ABNORMAL LOW (ref 39.0–52.0)
Hemoglobin: 9.8 g/dL — ABNORMAL LOW (ref 13.0–17.0)
Immature Granulocytes: 1 %
Lymphocytes Relative: 8 %
Lymphs Abs: 0.5 10*3/uL — ABNORMAL LOW (ref 0.7–4.0)
MCH: 31.9 pg (ref 26.0–34.0)
MCHC: 32.7 g/dL (ref 30.0–36.0)
MCV: 97.7 fL (ref 80.0–100.0)
Monocytes Absolute: 0.5 10*3/uL (ref 0.1–1.0)
Monocytes Relative: 8 %
Neutro Abs: 5.4 10*3/uL (ref 1.7–7.7)
Neutrophils Relative %: 81 %
Platelets: 91 10*3/uL — ABNORMAL LOW (ref 150–400)
RBC: 3.07 MIL/uL — ABNORMAL LOW (ref 4.22–5.81)
RDW: 14.6 % (ref 11.5–15.5)
WBC: 6.6 10*3/uL (ref 4.0–10.5)
nRBC: 0 % (ref 0.0–0.2)

## 2019-02-11 MED ORDER — CLONIDINE HCL 0.1 MG PO TABS
0.1000 mg | ORAL_TABLET | Freq: Two times a day (BID) | ORAL | Status: DC
  Administered 2019-02-11 (×2): 0.1 mg via ORAL
  Filled 2019-02-11 (×2): qty 1

## 2019-02-11 NOTE — Progress Notes (Signed)
Transported Jeffrey Campbell to [phone]. I gave report to Idolina Primer. I placed patient in bed in lowest position with bed alarm on with [name] by the bedside.

## 2019-02-11 NOTE — Progress Notes (Signed)
Patient ID: Jeffrey Campbell, male   DOB: June 22, 1932, 85 y.o.   MRN: [id number]     Advanced Heart Failure Rounding Note  PCP-Cardiologist: Fransico Him, MD   Subjective:    St Jude PPM placed 11/14 for CHB.  He is stable this morning.  Clevidipine ended up getting restarted yesterday afternoon so he did not transfer out of the unit.  This morning, SBP 150s.   Creatinine trend from 4.37>>4.87>>5.25>>5.13.    Objective:   Weight Range: 63.6 kg Body mass index is 21.32 kg/m.   Vital Signs:   Temp:  [97.6 F (36.4 C)-98.6 F (37 C)] 97.9 F (36.6 C) (11/15 0800) Pulse Rate:  [35-121] 58 (11/15 0700) Resp:  [10-25] 25 (11/15 0700) BP: (139-195)/(49-114) 156/68 (11/15 0700) SpO2:  [95 %-100 %] 99 % (11/15 0800) Weight:  [63.6 kg] 63.6 kg (11/15 0600) Last BM Date: 02/07/19  Weight change: Filed Weights   02/09/19 1158 02/10/19 0500 02/11/19 0600  Weight: 59.3 kg 62.5 kg 63.6 kg    Intake/Output:   Intake/Output Summary (Last 24 hours) at 02/11/2019 0912 Last data filed at 02/11/2019 0800 Gross per 24 hour  Intake 1100.71 ml  Output 950 ml  Net 150.71 ml      Physical Exam   General: NAD Neck: JVP 8 cm, no thyromegaly or thyroid nodule.  Lungs: Clear to auscultation bilaterally with normal respiratory effort. CV: Nondisplaced PMI.  Heart regular S1/S2, no S3/S4, 2/6 SEM RUSB.  No peripheral edema.   Abdomen: Soft, nontender, no hepatosplenomegaly, no distention.  Skin: Intact without lesions or rashes.  Neurologic: Alert and oriented x 3.  Psych: Normal affect. Extremities: No clubbing or cyanosis.  HEENT: Normal.    Telemetry   A-V sequential pacing 60 (personally reviewed).   Labs    CBC Recent Labs    02/10/19 0444 02/11/19 0218  WBC 5.7 6.6  NEUTROABS 4.3 5.4  HGB 10.4* 9.8*  HCT 32.3* 30.0*  MCV 98.8 97.7  PLT 105* 91*   Basic Metabolic Panel Recent Labs    02/09/19 0827 02/10/19 0444 02/11/19 0218  NA  --  136 136  K  --  4.2 4.6  CL   --  101 102  CO2  --  20* 20*  GLUCOSE  --  117* 121*  BUN  --  97* 100*  CREATININE  --  5.25* 5.13*  CALCIUM  --  9.6 9.9  MG 2.2  --   --    Liver Function Tests No results for input(s): AST, ALT, ALKPHOS, BILITOT, PROT, ALBUMIN in the last 72 hours. No results for input(s): LIPASE, AMYLASE in the last 72 hours. Cardiac Enzymes No results for input(s): CKTOTAL, CKMB, CKMBINDEX, TROPONINI in the last 72 hours.  BNP: BNP (last 3 results) Recent Labs    05/20/18 1933  BNP 3,913.9*    ProBNP (last 3 results) Recent Labs    01/09/19 1019  PROBNP 21,225*     D-Dimer No results for input(s): DDIMER in the last 72 hours. Hemoglobin A1C No results for input(s): HGBA1C in the last 72 hours. Fasting Lipid Panel Recent Labs    02/10/19 0444  TRIG 94   Thyroid Function Tests Recent Labs    02/09/19 0827  TSH 24.079*    Other results:   Imaging    No results found.   Medications:     Scheduled Medications: . amLODipine  10 mg Oral Daily  . apixaban  2.5 mg Oral BID  . atorvastatin  20 mg Oral Daily  . calcitRIOL  0.25 mcg Oral Daily  . Chlorhexidine Gluconate Cloth  6 each Topical Daily  . cloNIDine  0.1 mg Oral BID  . donepezil  10 mg Oral QHS  . doxazosin  4 mg Oral Daily  . hydrALAZINE  100 mg Oral TID  . levothyroxine  150 mcg Oral QAC breakfast  . memantine  10 mg Oral BID  . sodium chloride flush  3 mL Intravenous Q12H  . sodium chloride flush  3 mL Intravenous Q12H    Infusions: . sodium chloride Stopped (02/10/19 1205)    PRN Medications: sodium chloride, acetaminophen, ondansetron (ZOFRAN) IV, sodium chloride flush    Patient Profile   Jeffrey Campbell is an 85 year old with a history of CAD, PAD, CKD Stage IV, sinus bradycardia, dementia, chronic diastolic heart failure, pulmonary HTN and recent diagnosis of atrial flutter. He presented for DCCV 11/12, HR was around 40 in atrial flutter initially.  After DCCV, he was in CHB with escape  rate upper 30s. BP normotensive>>high.   Assessment/Plan   1. Complete heart block: Patient has a long history of sinus bradycardia in 40s-50s, recently noted to be in atrial flutter with worsening HF.  Patient was cardioverted out of slow atrial flutter 11/12 with underlying complete heart block, escape rate in 30s.  BP stable.  He is on Aricept for dementia, otherwise no nodal blockers.  Now s/p St Jude dual chamber PPM.  2. Pulmonary hypertension: Noted by echo in 2016 with PASP 68 mmHg and moderately dilated RV. PFTs showed mild obstruction with moderate to severely decreased DLCO, suggesting a pulmonary vascular problem. CT chest in 2/16 did not show definitive ILD. V/Q scan did not show evidence for acute or chronic PE. He carries a history of OSA but is not using CPAP. RHC showed mild pulmonary venous hypertension, PVR was only 1.8 WU. He had echo in 12/17 with PASP 77 mmHg but RHC again in 5/18 showed pulmonary venous hypertension. Suspect he has significant diastolic CHF. Most recent echo in 10/20 showed PASP 72 mmHg.  - Treatment will be diuresis, would avoid selective pulmonary vasodilators given primarily pulmonary venous hypertension.  3. Chronic diastolic CHF: With prominent RV failure. Patient has diastolic LV failure with pulmonary venous HTN on prior RHC. CKD stage IV increases tendency to retain fluid. PYP scan in 10/20 was not suggestive of ATTR amyloidosis.Atrial flutter may have worsened volume retention.  Recently transitioned from Lasix to torsemide, volume status ok today despite holding torsemide, creatinine seems to have plateaued and hopefully will trend back down some.  -Hold torsemide again today, hopefully restart tomorrow if creatinine continues to downtrend (60 mg daily).  4. CKD: Stage IV. Closely followed by Dr. Justin Mend with the thought that he may need HD soon. Recent creatinine up to 5.1.  SCr 4.37 on admit, increased to 5.25 on 11/14, now 5.13.  Torsemide  has been held and losartan stopped.   - Follow BMET closely.  5. HTN: Suspect bilateral renal artery stenosis plays a role.  Based on review of notes it appears that intervention on renal arteries has been decided against. IV clevidipine was restarted yesterday.   - Stop clevidipine today, add clonidine 0.1 bid.  - continue amlodipine 10 mg daily.  - continue Hydralazine 100 tid - continue doxazosin 4 mg daily - could add Coreg if needed with PPM in place.  6. PAD: Renal artery stenosis, mesenteric artery stenosis, distal aortic stenosis. - Continue statin.  7. CAD: Stable with no chest pain. Continue statin. Goal LDL <70. 4/16 Cardiolite with no ischemia. No ASA given need for anticoagulation.  8. Atrial flutter: He remains on apixaban at 2.5 mg bid, now out of flutter s/p DCCV.  9. Hypothyroidism: on levothyroxine.   Out of bed, ambulate today.  Can go to step down.  Home tomorrow if creatinine stable and BP controlled off clevidipine gtt.   Length of Stay: 3  Loralie Champagne, MD  02/11/2019, 9:12 AM  Advanced Heart Failure Team Pager [phone] (M-F; 7a - 4p)  Please contact [facility] for night-coverage after hours (4p -7a ) and weekends on [email or web address]

## 2019-02-12 DIAGNOSIS — I483 Typical atrial flutter: Secondary | ICD-10-CM

## 2019-02-12 LAB — CBC WITH DIFFERENTIAL/PLATELET
Abs Immature Granulocytes: 0.03 10*3/uL (ref 0.00–0.07)
Basophils Absolute: 0 10*3/uL (ref 0.0–0.1)
Basophils Relative: 0 %
Eosinophils Absolute: 0.2 10*3/uL (ref 0.0–0.5)
Eosinophils Relative: 4 %
HCT: 28.4 % — ABNORMAL LOW (ref 39.0–52.0)
Hemoglobin: 9.1 g/dL — ABNORMAL LOW (ref 13.0–17.0)
Immature Granulocytes: 1 %
Lymphocytes Relative: 13 %
Lymphs Abs: 0.7 10*3/uL (ref 0.7–4.0)
MCH: 32.2 pg (ref 26.0–34.0)
MCHC: 32 g/dL (ref 30.0–36.0)
MCV: 100.4 fL — ABNORMAL HIGH (ref 80.0–100.0)
Monocytes Absolute: 0.4 10*3/uL (ref 0.1–1.0)
Monocytes Relative: 7 %
Neutro Abs: 4.3 10*3/uL (ref 1.7–7.7)
Neutrophils Relative %: 75 %
Platelets: 89 10*3/uL — ABNORMAL LOW (ref 150–400)
RBC: 2.83 MIL/uL — ABNORMAL LOW (ref 4.22–5.81)
RDW: 14.6 % (ref 11.5–15.5)
WBC: 5.7 10*3/uL (ref 4.0–10.5)
nRBC: 0 % (ref 0.0–0.2)

## 2019-02-12 LAB — BASIC METABOLIC PANEL
Anion gap: 10 (ref 5–15)
BUN: 102 mg/dL — ABNORMAL HIGH (ref 8–23)
CO2: 20 mmol/L — ABNORMAL LOW (ref 22–32)
Calcium: 9.5 mg/dL (ref 8.9–10.3)
Chloride: 107 mmol/L (ref 98–111)
Creatinine, Ser: 4.93 mg/dL — ABNORMAL HIGH (ref 0.61–1.24)
GFR calc Af Amer: 12 mL/min — ABNORMAL LOW (ref >60)
GFR calc non Af Amer: 10 mL/min — ABNORMAL LOW (ref >60)
Glucose, Bld: 93 mg/dL (ref 70–99)
Potassium: 4 mmol/L (ref 3.5–5.1)
Sodium: 137 mmol/L (ref 135–145)

## 2019-02-12 MED ORDER — AMLODIPINE BESYLATE 10 MG PO TABS: 10.0000 mg | ORAL_TABLET | Freq: Every day | ORAL | 3 refills | Status: DC

## 2019-02-12 MED ORDER — TORSEMIDE 20 MG PO TABS
40.0000 mg | ORAL_TABLET | Freq: Every day | ORAL | Status: DC
  Administered 2019-02-12: 40 mg via ORAL
  Filled 2019-02-12: qty 2

## 2019-02-12 MED ORDER — CLONIDINE HCL 0.2 MG PO TABS
0.2000 mg | ORAL_TABLET | Freq: Two times a day (BID) | ORAL | Status: DC
  Administered 2019-02-12: 0.2 mg via ORAL
  Filled 2019-02-12: qty 1

## 2019-02-12 MED ORDER — HYDRALAZINE HCL 100 MG PO TABS: 100.0000 mg | ORAL_TABLET | Freq: Three times a day (TID) | ORAL | 3 refills | Status: DC

## 2019-02-12 MED ORDER — CLONIDINE HCL 0.2 MG PO TABS: 0.2000 mg | ORAL_TABLET | Freq: Two times a day (BID) | ORAL | 3 refills | Status: DC

## 2019-02-12 MED ORDER — DOXAZOSIN MESYLATE 4 MG PO TABS: 4.0000 mg | ORAL_TABLET | Freq: Every day | ORAL | 3 refills | Status: AC

## 2019-02-12 MED ORDER — TORSEMIDE 20 MG PO TABS: 40.0000 mg | ORAL_TABLET | Freq: Every day | ORAL | 3 refills | Status: DC

## 2019-02-12 MED ORDER — ATORVASTATIN CALCIUM 20 MG PO TABS: 20.0000 mg | ORAL_TABLET | Freq: Every day | ORAL | 3 refills | Status: DC

## 2019-02-12 MED FILL — Lidocaine HCl Local Inj 1%: INTRAMUSCULAR | Qty: 60 | Status: AC

## 2019-02-12 NOTE — Progress Notes (Signed)
Physical Therapy Treatment Patient Details Name: Jeffrey Campbell MRN: [id number] DOB: 01-26-1933 Today's Date: 02/12/2019    History of Present Illness Pt is and 85 y/o male admitted for surgery for complete heart block, s/p pacemaker placement 11/13.  PMHx:  pulm HTN, OSA, MVP, memory loss, Graves ds, glaucoma, CAD, CKD.    PT Comments    Patient received in bed, very pleasant and willing to participate in session today. Able to perform bed mobility with min guard and cues to maintain pacer precautions, initially stood with no device but very unsteady and required ModA to gain balance and ModA for gait in room with no device due to narrow BOS and scissoring, very high fall risk without external support. Then able to gait train approximately 368f in hallway with use of RW, cues to maintain pacer precautions/prevent excessive WB R UE with use of RW, continued to note ongoing narrow BOS but only required Min guard with RW. Updated recommendations based off of patient status today. He was left in bed with all needs met, bed alarm active.     Follow Up Recommendations  Home health PT     Equipment Recommendations  Rolling walker with 5" wheels    Recommendations for Other Services       Precautions / Restrictions Precautions Precautions: ICD/Pacemaker Restrictions Weight Bearing Restrictions: Yes RUE Weight Bearing: Partial weight bearing    Mobility  Bed Mobility Overal bed mobility: Needs Assistance Bed Mobility: Supine to Sit;Sit to Supine     Supine to sit: Min guard Sit to supine: Min guard   General bed mobility comments: min guard/supervision for pacer precautions  Transfers Overall transfer level: Needs assistance Equipment used: Rolling walker (2 wheeled) Transfers: Sit to/from Stand Sit to Stand: Min assist         General transfer comment: VC to maintain pacer precautions, minA to boost to full stand  Ambulation/Gait Ambulation/Gait assistance: Min  guard;Mod assist Gait Distance (Feet): 300 Feet Assistive device: Rolling walker (2 wheeled) Gait Pattern/deviations: Step-through pattern;Scissoring;Narrow base of support Gait velocity: decreased   General Gait Details: moderately unsteady without AD, narrow BOS/ModA to maintain balance with gait in room. Able to maintain balance min guard with RW, VC for pacer precautions   Stairs             Wheelchair Mobility    Modified Rankin (Stroke Patients Only)       Balance Overall balance assessment: Needs assistance Sitting-balance support: Feet supported Sitting balance-Leahy Scale: Good     Standing balance support: No upper extremity supported;During functional activity Standing balance-Leahy Scale: Poor Standing balance comment: ModA to maintain dynamic balance without UE support due to narrow BOS/scissoring                            Cognition Arousal/Alertness: Awake/alert Behavior During Therapy: Flat affect Overall Cognitive Status: History of cognitive impairments - at baseline                                        Exercises      General Comments General comments (skin integrity, edema, etc.): VC for pacer precautions, spouse not in room and patient statse she is "coming after lunch due to an appointment"      Pertinent Vitals/Pain Pain Assessment: No/denies pain Faces Pain Scale: No hurt Pain Intervention(s): Monitored during session  Home Living                      Prior Function            PT Goals (current goals can now be found in the care plan section) Acute Rehab PT Goals Patient Stated Goal: pt unable PT Goal Formulation: Patient unable to participate in goal setting Time For Goal Achievement: 02/17/19 Potential to Achieve Goals: Good Progress towards PT goals: Progressing toward goals    Frequency    Min 3X/week      PT Plan Equipment recommendations need to be updated;Discharge plan  needs to be updated    Co-evaluation              AM-PAC PT "6 Clicks" Mobility   Outcome Measure  Help needed turning from your back to your side while in a flat bed without using bedrails?: A Little Help needed moving from lying on your back to sitting on the side of a flat bed without using bedrails?: A Little Help needed moving to and from a bed to a chair (including a wheelchair)?: A Little Help needed standing up from a chair using your arms (e.g., wheelchair or bedside chair)?: A Little Help needed to walk in hospital room?: A Little Help needed climbing 3-5 steps with a railing? : A Little 6 Click Score: 18    End of Session Equipment Utilized During Treatment: Gait belt Activity Tolerance: Patient tolerated treatment well Patient left: in bed;with call bell/phone within reach;with bed alarm set   PT Visit Diagnosis: Unsteadiness on feet (R26.81)     Time: 4098-1191 PT Time Calculation (min) (ACUTE ONLY): 27 min  Charges:  $Gait Training: 8-22 mins $Therapeutic Activity: 8-22 mins                     Windell Norfolk, DPT, CBIS  Supplemental Physical Therapist [place]    Pager [phone] Acute Rehab Office [phone]

## 2019-02-12 NOTE — Care Management Important Message (Signed)
Important Message  Patient Details  Name: Jeffrey Campbell MRN: [id number] Date of Birth: 12/29/1932   Medicare Important Message Given:  Yes     [name] 02/12/2019, 2:28 PM

## 2019-02-12 NOTE — Progress Notes (Signed)
Patient ID: Jeffrey Campbell, male   DOB: September 26, 1932, 85 y.o.   MRN: [id number]     Advanced Heart Failure Rounding Note  PCP-Cardiologist: Fransico Him, MD   Subjective:    St Jude PPM placed 11/14 for CHB.  No complaints this morning.  BP remains high but relatively controlled.    Creatinine trend from 4.37>>4.87>>5.25>>5.13>>4.93.    Objective:   Weight Range: 62.4 kg Body mass index is 20.92 kg/m.   Vital Signs:   Temp:  [97.5 F (36.4 C)-98.2 F (36.8 C)] 98.2 F (36.8 C) (11/16 0510) Pulse Rate:  [57-59] 59 (11/16 0510) Resp:  [14-19] 19 (11/16 0510) BP: (155-171)/(56-85) 155/58 (11/16 0510) SpO2:  [87 %-99 %] 92 % (11/16 0514) Weight:  [62.4 kg] 62.4 kg (11/16 0510) Last BM Date: 02/11/19  Weight change: Filed Weights   02/10/19 0500 02/11/19 0600 02/12/19 0510  Weight: 62.5 kg 63.6 kg 62.4 kg    Intake/Output:   Intake/Output Summary (Last 24 hours) at 02/12/2019 0917 Last data filed at 02/11/2019 2230 Gross per 24 hour  Intake 280.69 ml  Output 150 ml  Net 130.69 ml      Physical Exam   General: NAD Neck: No JVD, no thyromegaly or thyroid nodule.  Lungs: Clear to auscultation bilaterally with normal respiratory effort. CV: Nondisplaced PMI.  Heart regular S1/S2, no S3/S4, 2/6 SEM RUSB.  No peripheral edema.   Abdomen: Soft, nontender, no hepatosplenomegaly, no distention.  Skin: Intact without lesions or rashes.  Neurologic: Alert and oriented x 3.  Psych: Normal affect. Extremities: No clubbing or cyanosis.  HEENT: Normal.     Telemetry   A-V sequential pacing 60 (personally reviewed).   Labs    CBC Recent Labs    02/11/19 0218 02/12/19 0427  WBC 6.6 5.7  NEUTROABS 5.4 4.3  HGB 9.8* 9.1*  HCT 30.0* 28.4*  MCV 97.7 100.4*  PLT 91* 89*   Basic Metabolic Panel Recent Labs    02/11/19 0218 02/12/19 0427  NA 136 137  K 4.6 4.0  CL 102 107  CO2 20* 20*  GLUCOSE 121* 93  BUN 100* 102*  CREATININE 5.13* 4.93*  CALCIUM 9.9 9.5    Liver Function Tests No results for input(s): AST, ALT, ALKPHOS, BILITOT, PROT, ALBUMIN in the last 72 hours. No results for input(s): LIPASE, AMYLASE in the last 72 hours. Cardiac Enzymes No results for input(s): CKTOTAL, CKMB, CKMBINDEX, TROPONINI in the last 72 hours.  BNP: BNP (last 3 results) Recent Labs    05/20/18 1933  BNP 3,913.9*    ProBNP (last 3 results) Recent Labs    01/09/19 1019  PROBNP 21,225*     D-Dimer No results for input(s): DDIMER in the last 72 hours. Hemoglobin A1C No results for input(s): HGBA1C in the last 72 hours. Fasting Lipid Panel Recent Labs    02/10/19 0444  TRIG 94   Thyroid Function Tests No results for input(s): TSH, T4TOTAL, T3FREE, THYROIDAB in the last 72 hours.  Invalid input(s): FREET3  Other results:   Imaging    No results found.   Medications:     Scheduled Medications: . amLODipine  10 mg Oral Daily  . apixaban  2.5 mg Oral BID  . atorvastatin  20 mg Oral Daily  . calcitRIOL  0.25 mcg Oral Daily  . Chlorhexidine Gluconate Cloth  6 each Topical Daily  . cloNIDine  0.2 mg Oral BID  . donepezil  10 mg Oral QHS  . doxazosin  4 mg  Oral Daily  . hydrALAZINE  100 mg Oral TID  . levothyroxine  150 mcg Oral QAC breakfast  . memantine  10 mg Oral BID  . sodium chloride flush  3 mL Intravenous Q12H  . sodium chloride flush  3 mL Intravenous Q12H  . torsemide  40 mg Oral Daily    Infusions: . sodium chloride Stopped (02/10/19 1205)    PRN Medications: sodium chloride, acetaminophen, ondansetron (ZOFRAN) IV, sodium chloride flush    Patient Profile   Jeffrey Campbell is an 85 year old with a history of CAD, PAD, CKD Stage IV, sinus bradycardia, dementia, chronic diastolic heart failure, pulmonary HTN and recent diagnosis of atrial flutter. He presented for DCCV 11/12, HR was around 40 in atrial flutter initially.  After DCCV, he was in CHB with escape rate upper 30s. BP normotensive>>high.   Assessment/Plan    1. Complete heart block: Patient has a long history of sinus bradycardia in 40s-50s, recently noted to be in atrial flutter with worsening HF.  Patient was cardioverted out of slow atrial flutter 11/12 with underlying complete heart block, escape rate in 30s.  Now s/p St Jude dual chamber PPM.  2. Pulmonary hypertension: Noted by echo in 2016 with PASP 68 mmHg and moderately dilated RV. PFTs showed mild obstruction with moderate to severely decreased DLCO, suggesting a pulmonary vascular problem. CT chest in 2/16 did not show definitive ILD. V/Q scan did not show evidence for acute or chronic PE. He carries a history of OSA but is not using CPAP. RHC showed mild pulmonary venous hypertension, PVR was only 1.8 WU. He had echo in 12/17 with PASP 77 mmHg but RHC again in 5/18 showed pulmonary venous hypertension. Suspect he has significant diastolic CHF. Most recent echo in 10/20 showed PASP 72 mmHg.  - Treatment will be diuresis, would avoid selective pulmonary vasodilators given primarily pulmonary venous hypertension.  3. Chronic diastolic CHF: With prominent RV failure. Patient has diastolic LV failure with pulmonary venous HTN on prior RHC. CKD stage IV increases tendency to retain fluid. PYP scan in 10/20 was not suggestive of ATTR amyloidosis.Atrial flutter may have worsened volume retention.  Recently transitioned from Lasix to torsemide, volume status ok today despite holding torsemide.  -I think he can restart a lower dose of torsemide for home, 40 mg daily.  4. CKD: Stage IV. Closely followed by Dr. Justin Mend with the thought that he may need HD soon. Recent creatinine up to 5.1.  SCr 4.37 on admit, increased to 5.25 on 11/14, now 4.9.  Torsemide has been held and losartan stopped.   - Stay off losartan.  - Restart torsemide at lower dose 40 mg daily.  5. HTN: Suspect bilateral renal artery stenosis plays a role.  Based on review of notes it appears that intervention on renal arteries  has been decided against. Had required IV clevidipine, now off.  - Increase clonidine to 0.2 mg bid.   - continue amlodipine 10 mg daily.  - continue Hydralazine 100 tid - continue doxazosin 4 mg daily - could add Coreg if needed with PPM in place.  6. PAD: Renal artery stenosis, mesenteric artery stenosis, distal aortic stenosis. - Continue statin.  7. CAD: Stable with no chest pain. Continue statin. Goal LDL <70. 4/16 Cardiolite with no ischemia. No ASA given need for anticoagulation.  8. Atrial flutter: He remains on apixaban at 2.5 mg bid, now out of flutter s/p DCCV.  9. Hypothyroidism: on levothyroxine.  10. Disposition: Home today, will  need followup with nephrology, CHF clinic, and EP.  Cardiac meds for discharge: apixaban 2.5 mg bid, amlodipine 10 mg daily, hydralazine 100 mg tid, doxazosin 4 mg daily, clonidine 0.2 bid, torsemide 40 daily, atorvastatin 20 daily.   Length of Stay: 4  Loralie Champagne, MD  02/12/2019, 9:17 AM  Advanced Heart Failure Team Pager [phone] (M-F; 7a - 4p)  Please contact [facility] for night-coverage after hours (4p -7a ) and weekends on [email or web address]

## 2019-02-12 NOTE — TOC Initial Note (Signed)
Transition of Care [facility]) - Initial/Assessment Note    Patient Details  Name: Jeffrey Campbell MRN: [id number] Date of Birth: September 24, 1932  Transition [facility]) CM/SW Contact:    Bethena Roys, RN Phone Number: 02/12/2019, 12:10 PM  Clinical Narrative: Pt presented for Complete Heart Block-s/p pacemaker. PTA from home with spouse. Plan to return home with [place]. CM did offer choice via Medicare.gov and patient has used [facility] in the past and wants to use again. Referral made to [facility] with [place] to begin within 24-48 hours post transition home. DME Rolling Walker ordered via [employer] and will be delivered to the room. No further needs from CM at this time.                   Expected Discharge Plan: [place] Barriers to Discharge: No Barriers Identified   Patient Goals and CMS Choice Patient states their goals for this hospitalization and ongoing recovery are:: "to return home with wife" CMS Medicare.gov Compare Post Acute Care list provided to:: Patient Choice offered to / list presented to : Patient  Expected Discharge Plan and Services Expected Discharge Plan: [place] In-house Referral: NA Discharge Planning Services: CM Consult Post Acute Care Choice: [place] arrangements for the past 2 months: Single Family Home Expected Discharge Date: 02/12/19               DME Arranged: Gilford Rile rolling DME Agency: [employer] Date DME Agency Contacted: 02/12/19 Time DME Agency Contacted: 1209 Representative spoke with at DME Agency: [place]: PT [place]: [place]) Date [place]: 02/12/19 Time HH Agency Contacted: 1209 Representative spoke with at [place]: [facility]  Prior Living Arrangements/Services Living arrangements for the past 2 months: [place] with:: Spouse Patient language and need for interpreter reviewed:: Yes Do you  feel safe going back to the place where you live?: Yes      Need for Family Participation in Patient Care: Yes (Comment) Care giver support system in place?: Yes (comment) Current home services: Home PT Criminal Activity/Legal Involvement Pertinent to Current Situation/Hospitalization: No - Comment as needed  Activities of Daily Living Home Assistive Devices/Equipment: Shower chair with back, Environmental consultant (specify type) ADL Screening (condition at time of admission) Patient's cognitive ability adequate to safely complete daily activities?: Yes Is the patient deaf or have difficulty hearing?: No Does the patient have difficulty seeing, even when wearing glasses/contacts?: No Does the patient have difficulty concentrating, remembering, or making decisions?: Yes Patient able to express need for assistance with ADLs?: Yes Does the patient have difficulty dressing or bathing?: No Independently performs ADLs?: Yes (appropriate for developmental age) Does the patient have difficulty walking or climbing stairs?: Yes Weakness of Legs: Both Weakness of Arms/Hands: None  Permission Sought/Granted Permission sought to share information with : Family Supports, Chartered certified accountant granted to share information with : Yes, Verbal Permission Granted     Permission granted to share info w [facility]        Emotional Assessment Appearance:: Appears stated age Attitude/Demeanor/Rapport: Lethargic Affect (typically observed): Accepting Orientation: : Oriented to Self, Oriented to Place, Oriented to  Time Alcohol / Substance Use: Not Applicable Psych Involvement: No (comment)  Admission diagnosis:  Complete heart block [facility]) [I44.2] Patient Active Problem List   Diagnosis Date Noted  . Complete heart block ([place]) 02/08/2019  . Atrial flutter ([place])   .  Dementia without behavioral disturbance ([place]) 09/18/2018  . Goals of care, counseling/discussion   . Palliative care by  specialist   . DNR (do not resuscitate) discussion   . Acute encephalopathy 09/01/2018  . Hypothermia 09/01/2018  . GI bleeding 10/15/2016  . GI bleed 10/15/2016  . Symptomatic anemia   . CKD (chronic kidney disease), stage V ([place])   . Hypertensive urgency   . Vascular dementia with behavioral disturbance ([place]) 09/24/2016  . Hypertensive encephalopathy   . Branch retinal vein occlusion of right eye 05/07/2016  . Graves' orbitopathy 05/07/2016  . Primary open angle glaucoma of left eye, mild stage 05/07/2016  . Primary open angle glaucoma of right eye, severe stage 05/07/2016  . PAD (peripheral artery disease) ([place]) 04/20/2016  . Bilateral renal artery stenosis ([place]) 02/18/2015  . Pulmonary hypertension ([place]) 02/10/2015  . Chronic diastolic CHF (congestive heart failure) ([place]) 02/10/2015  . Renal bruit 01/07/2015  . Carotid artery stenosis   . Diastolic dysfunction   . Bradycardia 05/29/2013  . Glaucoma 04/09/2011  . COPD (chronic obstructive pulmonary disease) ([place]) 09/21/2010  . OSA (obstructive sleep apnea)   . Upper airway resistance syndrome   . Hypothyroidism   . Type 2 diabetes, diet controlled ([place])   . CAD (coronary artery disease)   . Hyperlipemia   . Hypertensive heart and chronic kidney disease with heart failure and stage 1 through stage 4 chronic kidney disease, or chronic kidney disease ([place])   . PUD (peptic ulcer disease)    PCP:  [name], MD Pharmacy:   [id number] [place], [place] [address] [place] [address] Phone: [phone] Fax: [phone]  [facility] [place] [place] [facility] [employer] [place] [address] Phone: [phone] Fax: [phone]     Social Determinants of Health (SDOH) Interventions    Readmission Risk Interventions No flowsheet data found.

## 2019-02-12 NOTE — Discharge Summary (Signed)
Advanced Heart Failure Team  Discharge Summary   Patient ID: Jeffrey Campbell MRN: [id number], DOB/AGE: 1933-02-07 85 y.o. Admit date: 02/08/2019 D/C date:     02/12/2019   Primary Discharge Diagnoses:  Atrial Flutter s/p DCCV 02/08/19 Complete Heart Block  S/p PPM Implant 02/10/19 (St Jude Dual Chamber) Pulmonary Hypertension  Chronic Diastolic Heart Failure Stage IV CKD HTN CAD w/o Angina PVD Chronic Anticoagulation (Eliquis) Hypothyroidism   Medical History [place] and Hospital Course:   Mr Jeffrey Campbell is an 85 year old with a history ofCAD, PAD, CKDStage IV, sinus bradycardia,dementia, chronic diastolic heart failure, pulmonary HTN and recent diagnosis of atrial flutter. He presented for DCCV 11/12, HR was around 40 in atrial flutter initially. After DCCV, he was in CHB with escape rate upper 30s. He was admitted to ICU. Zoll pads placed. He remained hemodynamically stable and hospital course was actually c/b hypertension, required treatment w/ IV Clevidipine. EP consulted for CHB. On 11/14, he underwent PPM implant, Product manager. Tolerated procedure well w/o complication. BP improved clevidipine and PO antihypertensives. Clevidipine weaned off.   On 11/16, he was seen and examined by Dr. Aundra Dubin and felt stable for discharge from a cardiac standpoint. Wound check f/u has been arranged in device clinic. He also has f/u w/ Dr. Curt Bears arranged and clinic f/u with Dr. Aundra Dubin on 12/3. Wound care instructions and activity restrictions placed in AVS. See detailed summary of managed hospital medical problems below.   ___________________ 1. Complete heart block: Patient has a long history of sinus bradycardia in 40s-50s, recently noted to be in atrial flutter with worsening HF. Patient was cardioverted out of slow atrial flutter 11/12 with underlying complete heart block, escape rate in 30s. Now s/p St Jude dual chamber PPM.  2. Pulmonary hypertension: Noted by echo in 2016 with PASP 68  mmHg and moderately dilated RV. PFTs showed mild obstruction with moderate to severely decreased DLCO, suggesting a pulmonary vascular problem. CT chest in 2/16 did not show definitive ILD. V/Q scan did not show evidence for acute or chronic PE. He carries a history of OSA but is not using CPAP. RHC showed mild pulmonary venous hypertension, PVR was only 1.8 WU. He had echo in 12/17 with PASP 77 mmHg but RHC again in 5/18 showed pulmonary venous hypertension. Suspect he has significant diastolic CHF. Most recent echo in 10/20 showed PASP 72 mmHg.  - Treatment will be diuresis, would avoid selective pulmonary vasodilators given primarily pulmonary venous hypertension. 3. Chronic diastolic CHF: With prominent RV failure. Patient has diastolic LV failure with pulmonary venous HTN on prior RHC. CKD stage IV increases tendency to retain fluid. PYP scan in 10/20 was not suggestive of ATTR amyloidosis.Atrial flutter may have worsened volume retention.Recently transitioned from Lasix to torsemide, volume status ok today despite holding torsemide.  -I think he can restart a lower dose of torsemide for home, 40 mg daily.  4. CKD: Stage IV. Closely followed by Dr. Justin Mend with the thought that he may need HD soon. Recent creatinine up to5.1.  SCr 4.37 on admit, increased to 5.25 on 11/14, now 4.9.  Torsemide has been held and losartan stopped.  - Stay off losartan.  - Restart torsemide at lower dose 40 mg daily.  5. HTN: Suspect bilateral renal artery stenosis plays a role.  Based on review of notes it appears that intervention on renal arteries has been decided against.Had required IV clevidipine, now off.  - Increase clonidine to 0.2 mg bid.   -  continue amlodipine 10 mg daily.  - continue Hydralazine 100 tid - continue doxazosin 4 mg daily - could add Coreg if needed with PPM in place.  6. PAD: Renal artery stenosis, mesenteric artery stenosis, distal aortic stenosis. - Continue  statin. 7. CAD: Stable with no chest pain. Continue statin. Goal LDL <70. 4/16 Cardiolite with no ischemia. No ASA given need for anticoagulation.  8. Atrial flutter:He remains on apixaban at 2.5 mg bid, now out of flutter s/p DCCV. 9. Hypothyroidism: on levothyroxine.  10. Disposition: Home today, will need followup with nephrology, CHF clinic, and EP.  Cardiac meds for discharge: apixaban 2.5 mg bid, amlodipine 10 mg daily, hydralazine 100 mg tid, doxazosin 4 mg daily, clonidine 0.2 bid, torsemide 40 daily, atorvastatin 20 daily.    Discharge Weight Range: 137 lb  Discharge Vitals: Blood pressure (!) 147/63, pulse (!) 59, temperature 98.2 F (36.8 C), temperature source Oral, resp. rate 18, height 5\' 8"  (1.727 m), weight 62.4 kg, SpO2 93 %.  Labs: Lab Results  Component Value Date   WBC 5.7 02/12/2019   HGB 9.1 (L) 02/12/2019   HCT 28.4 (L) 02/12/2019   MCV 100.4 (H) 02/12/2019   PLT 89 (L) 02/12/2019    Recent Labs  Lab 02/12/19 0427  NA 137  K 4.0  CL 107  CO2 20*  BUN 102*  CREATININE 4.93*  CALCIUM 9.5  GLUCOSE 93   Lab Results  Component Value Date   CHOL 117 06/27/2017   HDL 60 06/27/2017   LDLCALC 48 06/27/2017   TRIG 94 02/10/2019   BNP (last 3 results) Recent Labs    05/20/18 1933  BNP 3,913.9*    ProBNP (last 3 results) Recent Labs    01/09/19 1019  PROBNP 21,225*     Diagnostic Studies/Procedures   No results found.  Discharge Medications   Allergies as of 02/12/2019      Reactions   Iodinated Diagnostic Agents Other (See Comments)   Right heart cath 07/2016 allegedly "shut down his kidneys" Patient has stage III chronic kidney disease   Zocor [simvastatin] Other (See Comments)   Liver problems    Budesonide-formoterol Fumarate Other (See Comments)   Dry mouth   Minoxidil Other (See Comments)   Unknown raction   Tiotropium Bromide Monohydrate Other (See Comments)   Dry mouth   Tricor [fenofibrate]    Unknown reaction       Medication List    STOP taking these medications   losartan 50 MG tablet Commonly known as: COZAAR     TAKE these medications   amLODipine 10 MG tablet Commonly known as: NORVASC Take 1 tablet (10 mg total) by mouth daily.   apixaban 2.5 MG Tabs tablet Commonly known as: ELIQUIS Take 1 tablet (2.5 mg total) by mouth 2 (two) times daily.   atorvastatin 20 MG tablet Commonly known as: LIPITOR Take 1 tablet (20 mg total) by mouth daily.   calcitRIOL 0.25 MCG capsule Commonly known as: ROCALTROL Take 0.25 mcg by mouth daily.   cloNIDine 0.2 MG tablet Commonly known as: CATAPRES Take 1 tablet (0.2 mg total) by mouth 2 (two) times daily.   donepezil 10 MG tablet Commonly known as: ARICEPT Take 1 tablet (10 mg total) by mouth at bedtime.   doxazosin 4 MG tablet Commonly known as: CARDURA Take 1 tablet (4 mg total) by mouth daily.   hydrALAZINE 100 MG tablet Commonly known as: APRESOLINE Take 1 tablet (100 mg total) by mouth 3 (three) times daily.  Please make yearly appt with Dr. Radford Pax for October. 1st attempt What changed: additional instructions   levothyroxine 150 MCG tablet Commonly known as: SYNTHROID Take 150 mcg by mouth daily before breakfast.   memantine 10 MG tablet Commonly known as: NAMENDA TAKE 1 TABLET BY MOUTH TWICE A DAY   PROCRIT IJ Inject 5,000 Units as directed every 14 (fourteen) days.   Simbrinza 1-0.2 % Susp Generic drug: Brinzolamide-Brimonidine Place 1 drop into both eyes 2 (two) times daily.   torsemide 20 MG tablet Commonly known as: DEMADEX Take 2 tablets (40 mg total) by mouth daily. What changed: how much to take   triamcinolone ointment 0.5 % Commonly known as: KENALOG Apply 1 application topically 2 (two) times daily as needed (rash).       Disposition   The patient will be discharged in stable condition to home. Discharge Instructions    Diet - low sodium heart healthy   Complete by: As directed    Increase activity  slowly   Complete by: As directed      Follow-up Information    Constance Haw, MD Follow up.   Specialty: Cardiology Why: 05/22/2019 @ 2:15PM Contact information: [address] [place] [address] [phone]        [facility] Follow up.   Specialty: Cardiology Why: 02/28/2019 @ 8:30AM, wound check visit Contact information: [address], [address] [place]       Larey Dresser, MD Follow up on 03/01/2019.   Specialty: Cardiology Why: 2:40 PM  Contact information: [address] [place] [address] [phone]        [name], MD. Schedule an appointment as soon as possible for a visit in 1 week(s).   Specialty: Family Medicine Why: to follow up for further managment of your thyroid. may need adjustments made to levothyroxine  Contact information: [address] [place] [address] [phone]        Sueanne Margarita, MD .   Specialty: Cardiology Contact information: [address] [place] [address] [phone]             Duration of Discharge Encounter: Greater than 35 minutes   Signed, Nelida Gores 02/12/2019, 10:35 AM

## 2019-02-15 ENCOUNTER — Other Ambulatory Visit: Payer: Self-pay

## 2019-02-15 ENCOUNTER — Encounter (HOSPITAL_COMMUNITY)
Admission: RE | Admit: 2019-02-15 | Discharge: 2019-02-15 | Disposition: A | Discharge status: Home / Self Care | Payer: Medicare Other | Source: Ambulatory Visit | Attending: Nephrology | Admitting: Nephrology

## 2019-02-15 VITALS — BP 156/71 | HR 59 | Temp 97.2°F | Resp 20

## 2019-02-15 DIAGNOSIS — N184 Chronic kidney disease, stage 4 (severe): Secondary | ICD-10-CM | POA: Diagnosis not present

## 2019-02-15 DIAGNOSIS — I130 Hypertensive heart and chronic kidney disease with heart failure and stage 1 through stage 4 chronic kidney disease, or unspecified chronic kidney disease: Secondary | ICD-10-CM | POA: Insufficient documentation

## 2019-02-15 DIAGNOSIS — D631 Anemia in chronic kidney disease: Secondary | ICD-10-CM | POA: Diagnosis not present

## 2019-02-15 LAB — POCT HEMOGLOBIN-HEMACUE: Hemoglobin: 9.5 g/dL — ABNORMAL LOW (ref 13.0–17.0)

## 2019-02-15 LAB — IRON AND TIBC
Iron: 58 ug/dL (ref 45–182)
Saturation Ratios: 24 % (ref 17.9–39.5)
TIBC: 246 ug/dL — ABNORMAL LOW (ref 250–450)
UIBC: 188 ug/dL

## 2019-02-15 LAB — FERRITIN: Ferritin: 600 ng/mL — ABNORMAL HIGH (ref 24–336)

## 2019-02-15 MED ORDER — EPOETIN ALFA 10000 UNIT/ML IJ SOLN
10000.0000 [IU] | INTRAMUSCULAR | Status: DC
  Administered 2019-02-15: 09:00:00 10000 [IU] via SUBCUTANEOUS

## 2019-02-15 MED ORDER — EPOETIN ALFA 10000 UNIT/ML IJ SOLN
INTRAMUSCULAR | Status: AC
  Administered 2019-02-15: 10000 [IU] via SUBCUTANEOUS
  Filled 2019-02-15: qty 1

## 2019-02-19 DIAGNOSIS — I2510 Atherosclerotic heart disease of native coronary artery without angina pectoris: Secondary | ICD-10-CM | POA: Diagnosis not present

## 2019-02-19 DIAGNOSIS — Z48812 Encounter for surgical aftercare following surgery on the circulatory system: Secondary | ICD-10-CM | POA: Diagnosis not present

## 2019-02-19 DIAGNOSIS — F0280 Dementia in other diseases classified elsewhere without behavioral disturbance: Secondary | ICD-10-CM | POA: Diagnosis not present

## 2019-02-19 DIAGNOSIS — I272 Pulmonary hypertension, unspecified: Secondary | ICD-10-CM | POA: Diagnosis not present

## 2019-02-19 DIAGNOSIS — Z950 Presence of cardiac pacemaker: Secondary | ICD-10-CM | POA: Diagnosis not present

## 2019-02-19 DIAGNOSIS — N184 Chronic kidney disease, stage 4 (severe): Secondary | ICD-10-CM | POA: Diagnosis not present

## 2019-02-19 DIAGNOSIS — I5032 Chronic diastolic (congestive) heart failure: Secondary | ICD-10-CM | POA: Diagnosis not present

## 2019-02-19 DIAGNOSIS — I442 Atrioventricular block, complete: Secondary | ICD-10-CM | POA: Diagnosis not present

## 2019-02-19 DIAGNOSIS — Z7901 Long term (current) use of anticoagulants: Secondary | ICD-10-CM | POA: Diagnosis not present

## 2019-02-19 DIAGNOSIS — I4892 Unspecified atrial flutter: Secondary | ICD-10-CM | POA: Diagnosis not present

## 2019-02-19 DIAGNOSIS — I5082 Biventricular heart failure: Secondary | ICD-10-CM | POA: Diagnosis not present

## 2019-02-19 DIAGNOSIS — E039 Hypothyroidism, unspecified: Secondary | ICD-10-CM | POA: Diagnosis not present

## 2019-02-19 DIAGNOSIS — I130 Hypertensive heart and chronic kidney disease with heart failure and stage 1 through stage 4 chronic kidney disease, or unspecified chronic kidney disease: Secondary | ICD-10-CM | POA: Diagnosis not present

## 2019-02-19 DIAGNOSIS — I739 Peripheral vascular disease, unspecified: Secondary | ICD-10-CM | POA: Diagnosis not present

## 2019-02-20 DIAGNOSIS — D649 Anemia, unspecified: Secondary | ICD-10-CM | POA: Diagnosis not present

## 2019-02-20 DIAGNOSIS — N184 Chronic kidney disease, stage 4 (severe): Secondary | ICD-10-CM | POA: Diagnosis not present

## 2019-02-20 DIAGNOSIS — Z8679 Personal history of other diseases of the circulatory system: Secondary | ICD-10-CM | POA: Diagnosis not present

## 2019-02-20 DIAGNOSIS — Z950 Presence of cardiac pacemaker: Secondary | ICD-10-CM | POA: Diagnosis not present

## 2019-02-20 DIAGNOSIS — I10 Essential (primary) hypertension: Secondary | ICD-10-CM | POA: Diagnosis not present

## 2019-02-20 DIAGNOSIS — I509 Heart failure, unspecified: Secondary | ICD-10-CM | POA: Diagnosis not present

## 2019-02-27 DIAGNOSIS — I130 Hypertensive heart and chronic kidney disease with heart failure and stage 1 through stage 4 chronic kidney disease, or unspecified chronic kidney disease: Secondary | ICD-10-CM | POA: Diagnosis not present

## 2019-02-27 DIAGNOSIS — N184 Chronic kidney disease, stage 4 (severe): Secondary | ICD-10-CM | POA: Diagnosis not present

## 2019-02-27 DIAGNOSIS — I5082 Biventricular heart failure: Secondary | ICD-10-CM | POA: Diagnosis not present

## 2019-02-27 DIAGNOSIS — I2510 Atherosclerotic heart disease of native coronary artery without angina pectoris: Secondary | ICD-10-CM | POA: Diagnosis not present

## 2019-02-27 DIAGNOSIS — Z48812 Encounter for surgical aftercare following surgery on the circulatory system: Secondary | ICD-10-CM | POA: Diagnosis not present

## 2019-02-27 DIAGNOSIS — I5032 Chronic diastolic (congestive) heart failure: Secondary | ICD-10-CM | POA: Diagnosis not present

## 2019-02-28 ENCOUNTER — Ambulatory Visit: Payer: Medicare Other

## 2019-02-28 DIAGNOSIS — I2510 Atherosclerotic heart disease of native coronary artery without angina pectoris: Secondary | ICD-10-CM | POA: Diagnosis not present

## 2019-02-28 DIAGNOSIS — I5030 Unspecified diastolic (congestive) heart failure: Secondary | ICD-10-CM | POA: Diagnosis not present

## 2019-02-28 DIAGNOSIS — N185 Chronic kidney disease, stage 5: Secondary | ICD-10-CM | POA: Diagnosis not present

## 2019-02-28 DIAGNOSIS — I120 Hypertensive chronic kidney disease with stage 5 chronic kidney disease or end stage renal disease: Secondary | ICD-10-CM | POA: Diagnosis not present

## 2019-02-28 DIAGNOSIS — N2581 Secondary hyperparathyroidism of renal origin: Secondary | ICD-10-CM | POA: Diagnosis not present

## 2019-02-28 DIAGNOSIS — J449 Chronic obstructive pulmonary disease, unspecified: Secondary | ICD-10-CM | POA: Diagnosis not present

## 2019-02-28 DIAGNOSIS — I272 Pulmonary hypertension, unspecified: Secondary | ICD-10-CM | POA: Diagnosis not present

## 2019-02-28 DIAGNOSIS — D631 Anemia in chronic kidney disease: Secondary | ICD-10-CM | POA: Diagnosis not present

## 2019-02-28 DIAGNOSIS — N189 Chronic kidney disease, unspecified: Secondary | ICD-10-CM | POA: Diagnosis not present

## 2019-03-01 ENCOUNTER — Ambulatory Visit (HOSPITAL_COMMUNITY)
Admission: RE | Admit: 2019-03-01 | Discharge: 2019-03-01 | Disposition: A | Discharge status: Home / Self Care | Payer: Medicare Other | Source: Ambulatory Visit | Attending: Cardiology | Admitting: Cardiology

## 2019-03-01 ENCOUNTER — Encounter (HOSPITAL_COMMUNITY): Payer: Self-pay | Admitting: Cardiology

## 2019-03-01 ENCOUNTER — Other Ambulatory Visit: Payer: Self-pay

## 2019-03-01 ENCOUNTER — Ambulatory Visit (HOSPITAL_COMMUNITY)
Admission: RE | Admit: 2019-03-01 | Discharge: 2019-03-01 | Disposition: A | Discharge status: Home / Self Care | Payer: Medicare Other | Source: Ambulatory Visit | Attending: Nephrology | Admitting: Nephrology

## 2019-03-01 VITALS — BP 157/71 | HR 60 | Temp 97.3°F

## 2019-03-01 VITALS — BP 128/60 | HR 60 | Wt 136.8 lb

## 2019-03-01 DIAGNOSIS — E785 Hyperlipidemia, unspecified: Secondary | ICD-10-CM | POA: Insufficient documentation

## 2019-03-01 DIAGNOSIS — I5032 Chronic diastolic (congestive) heart failure: Secondary | ICD-10-CM | POA: Insufficient documentation

## 2019-03-01 DIAGNOSIS — F0390 Unspecified dementia without behavioral disturbance: Secondary | ICD-10-CM | POA: Diagnosis not present

## 2019-03-01 DIAGNOSIS — I4892 Unspecified atrial flutter: Secondary | ICD-10-CM | POA: Diagnosis not present

## 2019-03-01 DIAGNOSIS — Z79899 Other long term (current) drug therapy: Secondary | ICD-10-CM | POA: Insufficient documentation

## 2019-03-01 DIAGNOSIS — Z79890 Hormone replacement therapy (postmenopausal): Secondary | ICD-10-CM | POA: Diagnosis not present

## 2019-03-01 DIAGNOSIS — Z87891 Personal history of nicotine dependence: Secondary | ICD-10-CM | POA: Insufficient documentation

## 2019-03-01 DIAGNOSIS — E1122 Type 2 diabetes mellitus with diabetic chronic kidney disease: Secondary | ICD-10-CM | POA: Insufficient documentation

## 2019-03-01 DIAGNOSIS — D631 Anemia in chronic kidney disease: Secondary | ICD-10-CM | POA: Insufficient documentation

## 2019-03-01 DIAGNOSIS — G4733 Obstructive sleep apnea (adult) (pediatric): Secondary | ICD-10-CM | POA: Diagnosis not present

## 2019-03-01 DIAGNOSIS — I130 Hypertensive heart and chronic kidney disease with heart failure and stage 1 through stage 4 chronic kidney disease, or unspecified chronic kidney disease: Secondary | ICD-10-CM | POA: Diagnosis not present

## 2019-03-01 DIAGNOSIS — N184 Chronic kidney disease, stage 4 (severe): Secondary | ICD-10-CM | POA: Insufficient documentation

## 2019-03-01 DIAGNOSIS — I701 Atherosclerosis of renal artery: Secondary | ICD-10-CM | POA: Diagnosis not present

## 2019-03-01 DIAGNOSIS — Z8711 Personal history of peptic ulcer disease: Secondary | ICD-10-CM | POA: Diagnosis not present

## 2019-03-01 DIAGNOSIS — I272 Pulmonary hypertension, unspecified: Secondary | ICD-10-CM | POA: Diagnosis not present

## 2019-03-01 DIAGNOSIS — I442 Atrioventricular block, complete: Secondary | ICD-10-CM | POA: Diagnosis not present

## 2019-03-01 DIAGNOSIS — K551 Chronic vascular disorders of intestine: Secondary | ICD-10-CM | POA: Insufficient documentation

## 2019-03-01 DIAGNOSIS — I2510 Atherosclerotic heart disease of native coronary artery without angina pectoris: Secondary | ICD-10-CM | POA: Diagnosis not present

## 2019-03-01 DIAGNOSIS — E890 Postprocedural hypothyroidism: Secondary | ICD-10-CM | POA: Diagnosis not present

## 2019-03-01 DIAGNOSIS — J449 Chronic obstructive pulmonary disease, unspecified: Secondary | ICD-10-CM | POA: Insufficient documentation

## 2019-03-01 DIAGNOSIS — Z950 Presence of cardiac pacemaker: Secondary | ICD-10-CM | POA: Insufficient documentation

## 2019-03-01 DIAGNOSIS — Z7901 Long term (current) use of anticoagulants: Secondary | ICD-10-CM | POA: Insufficient documentation

## 2019-03-01 DIAGNOSIS — I483 Typical atrial flutter: Secondary | ICD-10-CM

## 2019-03-01 LAB — BASIC METABOLIC PANEL
Anion gap: 14 (ref 5–15)
BUN: 102 mg/dL — ABNORMAL HIGH (ref 8–23)
CO2: 21 mmol/L — ABNORMAL LOW (ref 22–32)
Calcium: 9.4 mg/dL (ref 8.9–10.3)
Chloride: 103 mmol/L (ref 98–111)
Creatinine, Ser: 4.75 mg/dL — ABNORMAL HIGH (ref 0.61–1.24)
GFR calc Af Amer: 12 mL/min — ABNORMAL LOW (ref >60)
GFR calc non Af Amer: 10 mL/min — ABNORMAL LOW (ref >60)
Glucose, Bld: 154 mg/dL — ABNORMAL HIGH (ref 70–99)
Potassium: 4 mmol/L (ref 3.5–5.1)
Sodium: 138 mmol/L (ref 135–145)

## 2019-03-01 MED ORDER — EPOETIN ALFA 10000 UNIT/ML IJ SOLN
10000.0000 [IU] | INTRAMUSCULAR | Status: DC
  Administered 2019-03-01: 10000 [IU] via SUBCUTANEOUS

## 2019-03-01 MED ORDER — EPOETIN ALFA 10000 UNIT/ML IJ SOLN
INTRAMUSCULAR | Status: AC
  Filled 2019-03-01: qty 1

## 2019-03-01 NOTE — Patient Instructions (Signed)
Labs done today, we will notify you for abnormal results  Your physician recommends that you schedule a follow-up appointment in: 2 months  If you have any questions or concerns before your next appointment please send [place] a message through [address] or call our office at [phone].  At the [facility], you and your health needs are our priority. As part of our continuing mission to provide you with exceptional heart care, we have created designated Provider Care Teams. These Care Teams include your primary Cardiologist (physician) and Advanced Practice Providers (APPs- Physician Assistants and Nurse Practitioners) who all work together to provide you with the care you need, when you need it.   You may see any of the following providers on your designated Care Team at your next follow up: Marland Kitchen Dr Glori Bickers . Dr Loralie Champagne . Darrick Grinder, NP . Lyda Jester, PA . Audry Riles, PharmD   Please be sure to bring in all your medications bottles to every appointment.

## 2019-03-02 DIAGNOSIS — I5032 Chronic diastolic (congestive) heart failure: Secondary | ICD-10-CM | POA: Diagnosis not present

## 2019-03-02 DIAGNOSIS — Z48812 Encounter for surgical aftercare following surgery on the circulatory system: Secondary | ICD-10-CM | POA: Diagnosis not present

## 2019-03-02 DIAGNOSIS — I2510 Atherosclerotic heart disease of native coronary artery without angina pectoris: Secondary | ICD-10-CM | POA: Diagnosis not present

## 2019-03-02 DIAGNOSIS — I5082 Biventricular heart failure: Secondary | ICD-10-CM | POA: Diagnosis not present

## 2019-03-02 DIAGNOSIS — N184 Chronic kidney disease, stage 4 (severe): Secondary | ICD-10-CM | POA: Diagnosis not present

## 2019-03-02 DIAGNOSIS — I130 Hypertensive heart and chronic kidney disease with heart failure and stage 1 through stage 4 chronic kidney disease, or unspecified chronic kidney disease: Secondary | ICD-10-CM | POA: Diagnosis not present

## 2019-03-02 LAB — POCT HEMOGLOBIN-HEMACUE: Hemoglobin: 10 g/dL — ABNORMAL LOW (ref 13.0–17.0)

## 2019-03-03 NOTE — Progress Notes (Signed)
Patient ID: Jeffrey Campbell, male   DOB: [birth year], 84 y.o.   MRN: [id number] PCP: Dr. Drema [place] Cardiology: Dr Radford Pax HF Cardiology: Dr. Aundra Dubin  85 y.o. with history of CAD, PAD, CKD, sinus bradycardia, and pulmonary HTN by echo presents for followup of CHF.  Patient had an echo in 10/16 showing normal LV EF, but moderately dilated RV with PASP 68 mmHg.  He had an extensive workup for pulmonary hypertension. PFTs showed mild obstruction but moderate to severely decreased DLCO.  V/Q scan was negative for evidence of acute or chronic PE.  He had had a CT chest back in 2/16 that showed no definitive evidence for interstitial lung disease.  [place] in 11/16 showed pulmonary venous hypertension and Lasix was started at 20 mg daily.   He had rather diffuse moderate CAD on LHC in 2012 but no intervention.  Cardiolite in 4/16 showed no ischemia or infarction.  He has bilateral renal artery stenosis by renal artery dopplers in 10/16.  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  No claudication or intestinal angina.  It has been decided not to intervene on his renal arteries.   He had repeat echo in 32/95 with PA systolic pressure 77 mmHg, normal EF and evidence for worsening diastolic function.  RHC was done in 5/18, again showing primarily pulmonary venous hypertension.  Echo in 6/18 showed PASP 46 mmHg with EF 60-65%.  Most recent echo in 10/20 showed EF 55-60%, moderately dilated and dysfunctional RV, PASP 72, dilated IVC, severe biatrial enlargement.   Patient was seen in the office with volume overload and atrial flutter with relatively slow response.  He had DCCV in 11/20, post-DCCV rhythm was complete heart block with slow escape rhythm.  He was admitted and St Jude PPM was placed.  Creatinine has been in the 4 range.   Patient returns for followup of CHF.  He has been stable since pacemaker placement.  He denies exertional dyspnea.  No chest pain.  No orthopnea/PND.  No  lightheadedness.  Weight is down 17 lbs and peripheral edema has resolved with augmented diuresis.   ECG (personally reviewed): A-V dual paced  Labs (4/16): LDL 70 Labs (10/16): BNP 621, K 3.9, creatinine 2.51 Labs (11/16): K 4.2, creatinine 2.49 Labs (7/18): K 3.7, creatinine 2.82, hgb 7.5 Labs (10/20): K 4.8, creatinine 4.08, hgb 10, NT-proBNP 21225 Labs (11/20): K 4, creatinine 4.26  PMH: 1. CAD: LHC (2012) with 50-60% ostial LAD, 50-60% mLAD, 60% pRCA.  Cardiolite (4/16) with EF 58%, no ischemia or infarction.  2. [place] 3. HTN 4. Hyperlipidemia 5. Graves disease treated with RAI, now hypothyroid. 6. PUD 7. CKD III-IV 8. Type II diabetes 9. OSA: Not using CPAP 10. Sinus bradycardia: Holter (10/16) with average HR 44, occasional junctional rhythm.  11. Mitral valve prolapse: Moderate MR on 12/17 echo, minimal reported on 6/18 echo.  12. Chronic diastolic CHF: Echo (18/84) with EF 60-65%, grade II diastolic dysfunction, mild AI, MVP with mild MR, moderate RV dilation with normal systolic function, PASP 68 mmHg.   - Echo (12/17): EF 60-65%, moderate MR, grade III diastolic dysfunction, PASP 77 mmHg. - Echo (6/18): EF 60-65%, PASP 46 mmHg.  - Echo (10/20): EF 55-60%, mild LV, moderately decreased RV systolic function, moderate RV dilation, severe biatrial enlargement, dilated IVC, PASP 72 mmHg.  - PYP scan (10/20): grade 1, H/CL ratio 1.17; no evidence for ATTR amyloidosis.  13. PAD: Abdominal US (10/16) with > 50% distal aorta stenosis, > 70%  SMA and celiac artery stenoses, > 60% bilateral renal artery stenosis.  14. Pulmonary hypertension: PASP 68 mmHg on 10/16 echo.  PFTs (11/16) with FEV1 76%, FVC 78%, ratio 95%, TLC 73%, DLCO 48% => mild obstruction with moderate to severely decreased DLCO.  CT chest (2/16) without definitive interstitial lung disease.  V/Q scan 11/16 with no perfusion defects.  RHC (11/16) with mean RA 5 (v-waves to 18), PA 45/9 mean 24, PCWP mean 13 (v-waves to  33), CI 3.42, PVR 1.8 WU.  - RHC (5/18): mean RA 8, PA 68/12 mean 33, mean PCWP 21, CI 3.91, PVR 1.74 WU 15. Carotid stenosis: Carotid dopplers (10/16) with mild disease.  - Carotid dopplers (2/20): 50-69% BICA stenosis.  16. Dementia 17. COPD: Mild 18. Atrial flutter: Noted in 10/20. DCCV in 11/20.  19. Complete heart block: St Jude PPM in 11/20.   SH: Married, lives in [place], retired Scientist, research (medical), heavy smoker previously but quit in 1980s.    FH: HTN, CKD.  ROS: All systems reviewed and negative except as per HPI.   Current Outpatient Medications  Medication Sig Dispense Refill  . amLODipine (NORVASC) 10 MG tablet Take 1 tablet (10 mg total) by mouth daily. 30 tablet 3  . apixaban (ELIQUIS) 2.5 MG TABS tablet Take 1 tablet (2.5 mg total) by mouth 2 (two) times daily. 60 tablet 6  . atorvastatin (LIPITOR) 20 MG tablet Take 1 tablet (20 mg total) by mouth daily. 30 tablet 3  . Brinzolamide-Brimonidine (SIMBRINZA) 1-0.2 % SUSP Place 1 drop into both eyes 2 (two) times daily.     . calcitRIOL (ROCALTROL) 0.25 MCG capsule Take 0.25 mcg by mouth daily.    . cloNIDine (CATAPRES) 0.2 MG tablet Take 1 tablet (0.2 mg total) by mouth 2 (two) times daily. 60 tablet 3  . donepezil (ARICEPT) 10 MG tablet Take 1 tablet (10 mg total) by mouth at bedtime. 30 tablet 11  . doxazosin (CARDURA) 4 MG tablet Take 1 tablet (4 mg total) by mouth daily. 30 tablet 3  . Epoetin Alfa (PROCRIT IJ) Inject 5,000 Units as directed every 14 (fourteen) days.     . hydrALAZINE (APRESOLINE) 100 MG tablet Take 1 tablet (100 mg total) by mouth 3 (three) times daily. Please make yearly appt with Dr. Radford Pax for October. 1st attempt 90 tablet 3  . levothyroxine (SYNTHROID, LEVOTHROID) 150 MCG tablet Take 150 mcg by mouth daily before breakfast.    . memantine (NAMENDA) 10 MG tablet TAKE 1 TABLET BY MOUTH TWICE A DAY (Patient taking differently: Take 10 mg by mouth 2 (two) times daily. ) 180 tablet 3  . torsemide (DEMADEX)  20 MG tablet Take 2 tablets (40 mg total) by mouth daily. 60 tablet 3  . triamcinolone ointment (KENALOG) 0.5 % Apply 1 application topically 2 (two) times daily as needed (rash).     No current facility-administered medications for this encounter.    BP 128/60   Pulse 60   Wt 62.1 kg (136 lb 12.8 oz)   SpO2 99%   BMI 20.80 kg/m   General: NAD Neck: No JVD, no thyromegaly or thyroid nodule.  Lungs: Clear to auscultation bilaterally with normal respiratory effort. CV: Nondisplaced PMI.  Heart regular S1/S2 with widely split S2, no S3/S4, 2/6 HSM LLSB.  No peripheral edema.  No carotid bruit.  Normal pedal pulses.  Abdomen: Soft, nontender, no hepatosplenomegaly, no distention.  Skin: Intact without lesions or rashes.  Neurologic: Alert and oriented x 3.  Psych: Normal  affect. Extremities: No clubbing or cyanosis.  HEENT: Normal.   Assessment/Plan: 1. Pulmonary hypertension: Noted by echo in 2016 with PASP 68 mmHg and moderately dilated RV.  PFTs showed mild obstruction with moderate to severely decreased DLCO, suggesting a pulmonary vascular problem.  CT chest in 2/16 did not show definitive ILD.  V/Q scan did not show evidence for acute or chronic PE.  He carries a history of OSA but is not using CPAP.  RHC showed mild pulmonary venous hypertension, PVR was only 1.8 WU.   He had echo in 12/17 with PASP 77 mmHg but RHC again in 5/18 showed pulmonary venous hypertension.  Suspect he has significant diastolic CHF.  Most recent echo in 10/20 showed PASP 72 mmHg.   - Treatment will be diuresis, would avoid selective pulmonary vasodilators given primarily pulmonary venous hypertension.   2. Chronic diastolic CHF: With prominent RV failure.  Patient has diastolic LV failure with pulmonary venous HTN on prior RHC.  CKD stage IV increases tendency to retain fluid.  PYP scan in 10/20 was not suggestive of ATTR amyloidosis.  Volume status much improved on torsemide.   - Continue torsemide 40 mg daily,  BMET today.     - Wear compression stockings during the day.  3. CKD: Stage IV.  Closely followed by Dr. Justin Mend with the thought that he may need HD soon.  Recent creatinine up to 4.   - BMET today.  4. HTN: Suspect bilateral renal artery stenosis plays a role.  BP now controlled.  - Based on review of notes it appears that intervention on renal arteries has been decided against.  5. PAD: Renal artery stenosis, mesenteric artery stenosis, distal aortic stenosis. - Continue statin.  6. CAD: Stable with no chest pain.  Continue statin. Goal LDL < 70.  4/16 Cardiolite with no ischemia. No ASA given need for anticoagulation.  7. Complete heart block: Now s/p St Jude PPM placement.  8. Atrial flutter: DCCV in 11/20, a-paced today.  - Continue apixaban 2.5 mg bid (reduced dosing based on age and creatinine).    Followup 2 month.   Loralie Champagne 03/03/2019

## 2019-03-05 DIAGNOSIS — H401121 Primary open-angle glaucoma, left eye, mild stage: Secondary | ICD-10-CM | POA: Diagnosis not present

## 2019-03-05 DIAGNOSIS — H401113 Primary open-angle glaucoma, right eye, severe stage: Secondary | ICD-10-CM | POA: Diagnosis not present

## 2019-03-06 DIAGNOSIS — I2510 Atherosclerotic heart disease of native coronary artery without angina pectoris: Secondary | ICD-10-CM | POA: Diagnosis not present

## 2019-03-06 DIAGNOSIS — I5082 Biventricular heart failure: Secondary | ICD-10-CM | POA: Diagnosis not present

## 2019-03-06 DIAGNOSIS — I5032 Chronic diastolic (congestive) heart failure: Secondary | ICD-10-CM | POA: Diagnosis not present

## 2019-03-06 DIAGNOSIS — I130 Hypertensive heart and chronic kidney disease with heart failure and stage 1 through stage 4 chronic kidney disease, or unspecified chronic kidney disease: Secondary | ICD-10-CM | POA: Diagnosis not present

## 2019-03-06 DIAGNOSIS — N184 Chronic kidney disease, stage 4 (severe): Secondary | ICD-10-CM | POA: Diagnosis not present

## 2019-03-06 DIAGNOSIS — Z48812 Encounter for surgical aftercare following surgery on the circulatory system: Secondary | ICD-10-CM | POA: Diagnosis not present

## 2019-03-06 NOTE — Addendum Note (Signed)
Encounter addended by: Shirley Friar, PA-C on: 03/06/2019 8:27 AM  Actions taken: Clinical Note Signed

## 2019-03-06 NOTE — Progress Notes (Deleted)
  Pt missed appointment for wound check 12/2 due to confusion with date. Alerted by Dr. Aundra Dubin that patient was present and asked to check wound/device in clinic   Wound check appointment. Steri-strips removed. Wound without redness or edema. Incision edges approximated, wound well healed. Normal device function. Pt NOT dependent today. AS-VS at 35 bpm. Thresholds, sensing, and impedances consistent with implant measurements. RV programmed at 3.5V and RA programmed to auto capture programmed on for extra safety margin until 3 month visit. Histogram distribution appropriate for patient and level of activity. No mode switches or high ventricular rates noted. Patient educated about wound care, arm mobility, lifting restrictions. ROV in 3 months with implanting physician.    Device report to be scanned in to chart.

## 2019-03-06 NOTE — Addendum Note (Signed)
Encounter addended by: Shirley Friar, PA-C on: 03/06/2019 8:31 AM  Actions taken: Delete clinical note

## 2019-03-07 DIAGNOSIS — R05 Cough: Secondary | ICD-10-CM | POA: Diagnosis not present

## 2019-03-08 ENCOUNTER — Other Ambulatory Visit: Payer: Self-pay | Admitting: Student

## 2019-03-08 ENCOUNTER — Ambulatory Visit: Payer: Medicare Other

## 2019-03-15 ENCOUNTER — Encounter (HOSPITAL_COMMUNITY): Payer: Medicare Other

## 2019-03-21 DIAGNOSIS — Z950 Presence of cardiac pacemaker: Secondary | ICD-10-CM | POA: Diagnosis not present

## 2019-03-21 DIAGNOSIS — F0280 Dementia in other diseases classified elsewhere without behavioral disturbance: Secondary | ICD-10-CM | POA: Diagnosis not present

## 2019-03-21 DIAGNOSIS — I130 Hypertensive heart and chronic kidney disease with heart failure and stage 1 through stage 4 chronic kidney disease, or unspecified chronic kidney disease: Secondary | ICD-10-CM | POA: Diagnosis not present

## 2019-03-21 DIAGNOSIS — I4892 Unspecified atrial flutter: Secondary | ICD-10-CM | POA: Diagnosis not present

## 2019-03-21 DIAGNOSIS — Z7901 Long term (current) use of anticoagulants: Secondary | ICD-10-CM | POA: Diagnosis not present

## 2019-03-21 DIAGNOSIS — N184 Chronic kidney disease, stage 4 (severe): Secondary | ICD-10-CM | POA: Diagnosis not present

## 2019-03-21 DIAGNOSIS — I272 Pulmonary hypertension, unspecified: Secondary | ICD-10-CM | POA: Diagnosis not present

## 2019-03-21 DIAGNOSIS — E039 Hypothyroidism, unspecified: Secondary | ICD-10-CM | POA: Diagnosis not present

## 2019-03-21 DIAGNOSIS — I2510 Atherosclerotic heart disease of native coronary artery without angina pectoris: Secondary | ICD-10-CM | POA: Diagnosis not present

## 2019-03-21 DIAGNOSIS — Z48812 Encounter for surgical aftercare following surgery on the circulatory system: Secondary | ICD-10-CM | POA: Diagnosis not present

## 2019-03-21 DIAGNOSIS — I5082 Biventricular heart failure: Secondary | ICD-10-CM | POA: Diagnosis not present

## 2019-03-21 DIAGNOSIS — I739 Peripheral vascular disease, unspecified: Secondary | ICD-10-CM | POA: Diagnosis not present

## 2019-03-21 DIAGNOSIS — I5032 Chronic diastolic (congestive) heart failure: Secondary | ICD-10-CM | POA: Diagnosis not present

## 2019-03-21 DIAGNOSIS — I442 Atrioventricular block, complete: Secondary | ICD-10-CM | POA: Diagnosis not present

## 2019-03-29 ENCOUNTER — Encounter (HOSPITAL_COMMUNITY): Payer: Medicare Other

## 2019-04-03 ENCOUNTER — Other Ambulatory Visit (HOSPITAL_COMMUNITY): Payer: Self-pay | Admitting: Cardiology

## 2019-04-05 DIAGNOSIS — I5030 Unspecified diastolic (congestive) heart failure: Secondary | ICD-10-CM | POA: Diagnosis not present

## 2019-04-05 DIAGNOSIS — I272 Pulmonary hypertension, unspecified: Secondary | ICD-10-CM | POA: Diagnosis not present

## 2019-04-05 DIAGNOSIS — D631 Anemia in chronic kidney disease: Secondary | ICD-10-CM | POA: Diagnosis not present

## 2019-04-05 DIAGNOSIS — I2510 Atherosclerotic heart disease of native coronary artery without angina pectoris: Secondary | ICD-10-CM | POA: Diagnosis not present

## 2019-04-05 DIAGNOSIS — N2581 Secondary hyperparathyroidism of renal origin: Secondary | ICD-10-CM | POA: Diagnosis not present

## 2019-04-05 DIAGNOSIS — I120 Hypertensive chronic kidney disease with stage 5 chronic kidney disease or end stage renal disease: Secondary | ICD-10-CM | POA: Diagnosis not present

## 2019-04-05 DIAGNOSIS — J449 Chronic obstructive pulmonary disease, unspecified: Secondary | ICD-10-CM | POA: Diagnosis not present

## 2019-04-05 DIAGNOSIS — N185 Chronic kidney disease, stage 5: Secondary | ICD-10-CM | POA: Diagnosis not present

## 2019-04-05 DIAGNOSIS — N189 Chronic kidney disease, unspecified: Secondary | ICD-10-CM | POA: Diagnosis not present

## 2019-04-13 DIAGNOSIS — I5082 Biventricular heart failure: Secondary | ICD-10-CM | POA: Diagnosis not present

## 2019-04-13 DIAGNOSIS — Z48812 Encounter for surgical aftercare following surgery on the circulatory system: Secondary | ICD-10-CM | POA: Diagnosis not present

## 2019-04-13 DIAGNOSIS — N184 Chronic kidney disease, stage 4 (severe): Secondary | ICD-10-CM | POA: Diagnosis not present

## 2019-04-13 DIAGNOSIS — I5032 Chronic diastolic (congestive) heart failure: Secondary | ICD-10-CM | POA: Diagnosis not present

## 2019-04-13 DIAGNOSIS — I130 Hypertensive heart and chronic kidney disease with heart failure and stage 1 through stage 4 chronic kidney disease, or unspecified chronic kidney disease: Secondary | ICD-10-CM | POA: Diagnosis not present

## 2019-04-13 DIAGNOSIS — I2510 Atherosclerotic heart disease of native coronary artery without angina pectoris: Secondary | ICD-10-CM | POA: Diagnosis not present

## 2019-04-17 DIAGNOSIS — Z48812 Encounter for surgical aftercare following surgery on the circulatory system: Secondary | ICD-10-CM | POA: Diagnosis not present

## 2019-04-17 DIAGNOSIS — I5032 Chronic diastolic (congestive) heart failure: Secondary | ICD-10-CM | POA: Diagnosis not present

## 2019-04-17 DIAGNOSIS — I5082 Biventricular heart failure: Secondary | ICD-10-CM | POA: Diagnosis not present

## 2019-04-17 DIAGNOSIS — I130 Hypertensive heart and chronic kidney disease with heart failure and stage 1 through stage 4 chronic kidney disease, or unspecified chronic kidney disease: Secondary | ICD-10-CM | POA: Diagnosis not present

## 2019-04-17 DIAGNOSIS — I2510 Atherosclerotic heart disease of native coronary artery without angina pectoris: Secondary | ICD-10-CM | POA: Diagnosis not present

## 2019-04-17 DIAGNOSIS — N184 Chronic kidney disease, stage 4 (severe): Secondary | ICD-10-CM | POA: Diagnosis not present

## 2019-05-04 ENCOUNTER — Encounter (HOSPITAL_COMMUNITY): Payer: Self-pay | Admitting: Cardiology

## 2019-05-04 ENCOUNTER — Ambulatory Visit (HOSPITAL_COMMUNITY)
Admission: RE | Admit: 2019-05-04 | Discharge: 2019-05-04 | Disposition: A | Discharge status: Home / Self Care | Payer: Medicare Other | Source: Ambulatory Visit | Attending: Cardiology | Admitting: Cardiology

## 2019-05-04 ENCOUNTER — Other Ambulatory Visit: Payer: Self-pay

## 2019-05-04 VITALS — BP 160/87 | HR 84 | Wt 137.6 lb

## 2019-05-04 DIAGNOSIS — E785 Hyperlipidemia, unspecified: Secondary | ICD-10-CM | POA: Diagnosis not present

## 2019-05-04 DIAGNOSIS — F0390 Unspecified dementia without behavioral disturbance: Secondary | ICD-10-CM | POA: Diagnosis not present

## 2019-05-04 DIAGNOSIS — I5032 Chronic diastolic (congestive) heart failure: Secondary | ICD-10-CM | POA: Insufficient documentation

## 2019-05-04 DIAGNOSIS — Z950 Presence of cardiac pacemaker: Secondary | ICD-10-CM | POA: Insufficient documentation

## 2019-05-04 DIAGNOSIS — I272 Pulmonary hypertension, unspecified: Secondary | ICD-10-CM | POA: Insufficient documentation

## 2019-05-04 DIAGNOSIS — I701 Atherosclerosis of renal artery: Secondary | ICD-10-CM | POA: Diagnosis not present

## 2019-05-04 DIAGNOSIS — I6529 Occlusion and stenosis of unspecified carotid artery: Secondary | ICD-10-CM

## 2019-05-04 DIAGNOSIS — I50810 Right heart failure, unspecified: Secondary | ICD-10-CM | POA: Insufficient documentation

## 2019-05-04 DIAGNOSIS — Z8249 Family history of ischemic heart disease and other diseases of the circulatory system: Secondary | ICD-10-CM | POA: Diagnosis not present

## 2019-05-04 DIAGNOSIS — Z8711 Personal history of peptic ulcer disease: Secondary | ICD-10-CM | POA: Diagnosis not present

## 2019-05-04 DIAGNOSIS — N184 Chronic kidney disease, stage 4 (severe): Secondary | ICD-10-CM

## 2019-05-04 DIAGNOSIS — I2510 Atherosclerotic heart disease of native coronary artery without angina pectoris: Secondary | ICD-10-CM | POA: Diagnosis not present

## 2019-05-04 DIAGNOSIS — E1122 Type 2 diabetes mellitus with diabetic chronic kidney disease: Secondary | ICD-10-CM | POA: Diagnosis not present

## 2019-05-04 DIAGNOSIS — Z7901 Long term (current) use of anticoagulants: Secondary | ICD-10-CM | POA: Insufficient documentation

## 2019-05-04 DIAGNOSIS — E890 Postprocedural hypothyroidism: Secondary | ICD-10-CM | POA: Insufficient documentation

## 2019-05-04 DIAGNOSIS — Z87891 Personal history of nicotine dependence: Secondary | ICD-10-CM | POA: Insufficient documentation

## 2019-05-04 DIAGNOSIS — I130 Hypertensive heart and chronic kidney disease with heart failure and stage 1 through stage 4 chronic kidney disease, or unspecified chronic kidney disease: Secondary | ICD-10-CM | POA: Insufficient documentation

## 2019-05-04 DIAGNOSIS — Z79899 Other long term (current) drug therapy: Secondary | ICD-10-CM | POA: Insufficient documentation

## 2019-05-04 DIAGNOSIS — I442 Atrioventricular block, complete: Secondary | ICD-10-CM | POA: Diagnosis not present

## 2019-05-04 DIAGNOSIS — E1151 Type 2 diabetes mellitus with diabetic peripheral angiopathy without gangrene: Secondary | ICD-10-CM | POA: Diagnosis not present

## 2019-05-04 DIAGNOSIS — J449 Chronic obstructive pulmonary disease, unspecified: Secondary | ICD-10-CM | POA: Diagnosis not present

## 2019-05-04 DIAGNOSIS — Z79890 Hormone replacement therapy (postmenopausal): Secondary | ICD-10-CM | POA: Insufficient documentation

## 2019-05-04 DIAGNOSIS — I4892 Unspecified atrial flutter: Secondary | ICD-10-CM | POA: Diagnosis not present

## 2019-05-04 DIAGNOSIS — G4733 Obstructive sleep apnea (adult) (pediatric): Secondary | ICD-10-CM | POA: Diagnosis not present

## 2019-05-04 LAB — BASIC METABOLIC PANEL
Anion gap: 14 (ref 5–15)
BUN: 100 mg/dL — ABNORMAL HIGH (ref 8–23)
CO2: 21 mmol/L — ABNORMAL LOW (ref 22–32)
Calcium: 9.8 mg/dL (ref 8.9–10.3)
Chloride: 108 mmol/L (ref 98–111)
Creatinine, Ser: 4.25 mg/dL — ABNORMAL HIGH (ref 0.61–1.24)
GFR calc Af Amer: 14 mL/min — ABNORMAL LOW (ref >60)
GFR calc non Af Amer: 12 mL/min — ABNORMAL LOW (ref >60)
Glucose, Bld: 182 mg/dL — ABNORMAL HIGH (ref 70–99)
Potassium: 3.6 mmol/L (ref 3.5–5.1)
Sodium: 143 mmol/L (ref 135–145)

## 2019-05-04 LAB — LIPID PANEL
Cholesterol: 117 mg/dL (ref 0–200)
HDL: 49 mg/dL (ref >40)
LDL Cholesterol: 56 mg/dL (ref 0–99)
Total CHOL/HDL Ratio: 2.4 RATIO
Triglycerides: 61 mg/dL (ref <150)
VLDL: 12 mg/dL (ref 0–40)

## 2019-05-04 MED ORDER — CLONIDINE HCL 0.2 MG PO TABS: 0.3000 mg | ORAL_TABLET | Freq: Two times a day (BID) | ORAL | 3 refills | Status: DC

## 2019-05-04 NOTE — Patient Instructions (Addendum)
INCREASE Clonidine to 0.3mg  (1.5 tabs) twice a day  Labs today We will only contact you if something comes back abnormal or we need to make some changes. Otherwise no news is good news!  You have been ordered for Carotid Dopplers at [facility].  They will call you to schedule this appointment.   Your physician recommends that you schedule a follow-up appointment in: 6  Months with Dr Aundra Dubin.  You will be called in the spring to schedule this appointment  Please call office at [phone] option 2 if you have any questions or concerns.   At the [facility], you and your health needs are our priority. As part of our continuing mission to provide you with exceptional heart care, we have created designated Provider Care Teams. These Care Teams include your primary Cardiologist (physician) and Advanced Practice Providers (APPs- Physician Assistants and Nurse Practitioners) who all work together to provide you with the care you need, when you need it.   You may see any of the following providers on your designated Care Team at your next follow up: Marland Kitchen Dr Glori Bickers . Dr Loralie Champagne . Darrick Grinder, NP . Lyda Jester, PA . Audry Riles, PharmD   Please be sure to bring in all your medications bottles to every appointment.

## 2019-05-06 NOTE — Progress Notes (Signed)
Patient ID: Jeffrey Campbell, male   DOB: Aug 02, 1932, 86 y.o.   MRN: [id number] PCP: Dr. Drema Dallas Cardiology: Dr Radford Pax HF Cardiology: Dr. Aundra Dubin  86 y.o. with history of CAD, PAD, CKD, sinus bradycardia, and pulmonary HTN by echo presents for followup of CHF.  Patient had an echo in 10/16 showing normal LV EF, but moderately dilated RV with PASP 68 mmHg.  He had an extensive workup for pulmonary hypertension. PFTs showed mild obstruction but moderate to severely decreased DLCO.  V/Q scan was negative for evidence of acute or chronic PE.  He had had a CT chest back in 2/16 that showed no definitive evidence for interstitial lung disease.  [place] in 11/16 showed pulmonary venous hypertension and Lasix was started at 20 mg daily.   He had rather diffuse moderate CAD on LHC in 2012 but no intervention.  Cardiolite in 4/16 showed no ischemia or infarction.  He has bilateral renal artery stenosis by renal artery dopplers in 10/16.  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  No claudication or intestinal angina.  It has been decided not to intervene on his renal arteries.   He had repeat echo in 66/06 with PA systolic pressure 77 mmHg, normal EF and evidence for worsening diastolic function.  RHC was done in 5/18, again showing primarily pulmonary venous hypertension.  Echo in 6/18 showed PASP 46 mmHg with EF 60-65%.  Most recent echo in 10/20 showed EF 55-60%, moderately dilated and dysfunctional RV, PASP 72, dilated IVC, severe biatrial enlargement.   Patient was seen in the office with volume overload and atrial flutter with relatively slow response.  He had DCCV in 11/20, post-DCCV rhythm was complete heart block with slow escape rhythm.  He was admitted and St Jude PPM was placed.  Creatinine has been in the 4 range.   Patient returns for followup of CHF.  Weight is stable.  No significant exertional dyspnea or chest pain.  No lightheadedness or falls.  No orthopnea/PND.  Overall  seems to be doing well.  BP remains quite elevated.  Per grand-daughter, it is high at home as well.   Labs (4/16): LDL 70 Labs (10/16): BNP 621, K 3.9, creatinine 2.51 Labs (11/16): K 4.2, creatinine 2.49 Labs (7/18): K 3.7, creatinine 2.82, hgb 7.5 Labs (10/20): K 4.8, creatinine 4.08, hgb 10, NT-proBNP 21225 Labs (11/20): K 4, creatinine 4.26  Labs (12/20): K 4, creatinine 4.75  PMH: 1. CAD: LHC (2012) with 50-60% ostial LAD, 50-60% mLAD, 60% pRCA.  Cardiolite (4/16) with EF 58%, no ischemia or infarction.  2. [place] 3. HTN 4. Hyperlipidemia 5. Graves disease treated with RAI, now hypothyroid. 6. PUD 7. CKD III-IV 8. Type II diabetes 9. OSA: Not using CPAP 10. Sinus bradycardia: Holter (10/16) with average HR 44, occasional junctional rhythm.  11. Mitral valve prolapse: Moderate MR on 12/17 echo, minimal reported on 6/18 echo.  12. Chronic diastolic CHF: Echo (30/16) with EF 60-65%, grade II diastolic dysfunction, mild AI, MVP with mild MR, moderate RV dilation with normal systolic function, PASP 68 mmHg.   - Echo (12/17): EF 60-65%, moderate MR, grade III diastolic dysfunction, PASP 77 mmHg. - Echo (6/18): EF 60-65%, PASP 46 mmHg.  - Echo (10/20): EF 55-60%, mild LV, moderately decreased RV systolic function, moderate RV dilation, severe biatrial enlargement, dilated IVC, PASP 72 mmHg.  - PYP scan (10/20): grade 1, H/CL ratio 1.17; no evidence for ATTR amyloidosis.  13. PAD: Abdominal US (10/16) with > 50%  distal aorta stenosis, > 70% SMA and celiac artery stenoses, > 60% bilateral renal artery stenosis.  14. Pulmonary hypertension: PASP 68 mmHg on 10/16 echo.  PFTs (11/16) with FEV1 76%, FVC 78%, ratio 95%, TLC 73%, DLCO 48% => mild obstruction with moderate to severely decreased DLCO.  CT chest (2/16) without definitive interstitial lung disease.  V/Q scan 11/16 with no perfusion defects.  RHC (11/16) with mean RA 5 (v-waves to 18), PA 45/9 mean 24, PCWP mean 13 (v-waves to 33), CI  3.42, PVR 1.8 WU.  - RHC (5/18): mean RA 8, PA 68/12 mean 33, mean PCWP 21, CI 3.91, PVR 1.74 WU 15. Carotid stenosis: Carotid dopplers (10/16) with mild disease.  - Carotid dopplers (2/20): 50-69% BICA stenosis.  16. Dementia 17. COPD: Mild 18. Atrial flutter: Noted in 10/20. DCCV in 11/20.  19. Complete heart block: St Jude PPM in 11/20.   SH: Married, lives in [place], retired Scientist, research (medical), heavy smoker previously but quit in 1980s.    FH: HTN, CKD.  ROS: All systems reviewed and negative except as per HPI.   Current Outpatient Medications  Medication Sig Dispense Refill  . amLODipine (NORVASC) 10 MG tablet Take 1 tablet (10 mg total) by mouth daily. 30 tablet 3  . apixaban (ELIQUIS) 2.5 MG TABS tablet Take 1 tablet (2.5 mg total) by mouth 2 (two) times daily. 60 tablet 6  . atorvastatin (LIPITOR) 20 MG tablet Take 1 tablet (20 mg total) by mouth daily. 30 tablet 3  . Brinzolamide-Brimonidine (SIMBRINZA) 1-0.2 % SUSP Place 1 drop into both eyes 2 (two) times daily.     . calcitRIOL (ROCALTROL) 0.25 MCG capsule Take 0.25 mcg by mouth daily.    . cloNIDine (CATAPRES) 0.2 MG tablet Take 1.5 tablets (0.3 mg total) by mouth 2 (two) times daily. 90 tablet 3  . donepezil (ARICEPT) 10 MG tablet Take 1 tablet (10 mg total) by mouth at bedtime. 30 tablet 11  . doxazosin (CARDURA) 4 MG tablet Take 1 tablet (4 mg total) by mouth daily. 30 tablet 3  . Epoetin Alfa (PROCRIT IJ) Inject 5,000 Units as directed every 14 (fourteen) days.     . hydrALAZINE (APRESOLINE) 100 MG tablet Take 1 tablet (100 mg total) by mouth 3 (three) times daily. Please make yearly appt with Dr. Radford Pax for October. 1st attempt 90 tablet 3  . levothyroxine (SYNTHROID, LEVOTHROID) 150 MCG tablet Take 150 mcg by mouth daily before breakfast.    . memantine (NAMENDA) 10 MG tablet TAKE 1 TABLET BY MOUTH TWICE A DAY (Patient taking differently: Take 10 mg by mouth 2 (two) times daily. ) 180 tablet 3  . torsemide (DEMADEX) 20  MG tablet TAKE 4 TABLETS (80 MG TOTAL) BY MOUTH DAILY. 360 tablet 1  . triamcinolone ointment (KENALOG) 0.5 % Apply 1 application topically 2 (two) times daily as needed (rash).     No current facility-administered medications for this encounter.   BP (!) 160/87   Pulse 84   Wt 62.4 kg (137 lb 9.6 oz)   SpO2 98%   BMI 20.92 kg/m   General: NAD Neck: No JVD, no thyromegaly or thyroid nodule.  Lungs: Clear to auscultation bilaterally with normal respiratory effort. CV: Nondisplaced PMI.  Heart regular S1/S2, no S3/S4, 2/6 SEM RUSB.  No peripheral edema.  No carotid bruit.  Normal pedal pulses.  Abdomen: Soft, nontender, no hepatosplenomegaly, no distention.  Skin: Intact without lesions or rashes.  Neurologic: Alert and oriented x 3.  Psych:  Normal affect. Extremities: No clubbing or cyanosis.  HEENT: Normal.   Assessment/Plan: 1. Pulmonary hypertension: Noted by echo in 2016 with PASP 68 mmHg and moderately dilated RV.  PFTs showed mild obstruction with moderate to severely decreased DLCO, suggesting a pulmonary vascular problem.  CT chest in 2/16 did not show definitive ILD.  V/Q scan did not show evidence for acute or chronic PE.  He carries a history of OSA but is not using CPAP.  RHC showed mild pulmonary venous hypertension, PVR was only 1.8 WU.   He had echo in 12/17 with PASP 77 mmHg but RHC again in 5/18 showed pulmonary venous hypertension.  Suspect he has significant diastolic CHF.  Most recent echo in 10/20 showed PASP 72 mmHg.   - Treatment will be diuresis, would avoid selective pulmonary vasodilators given primarily pulmonary venous hypertension.   2. Chronic diastolic CHF: With prominent RV failure.  Patient has diastolic LV failure with pulmonary venous HTN on prior RHC.  CKD stage IV increases tendency to retain fluid.  PYP scan in 10/20 was not suggestive of ATTR amyloidosis.  Volume looks ok on current torsemide though creatinine is slowly rising.  - Continue torsemide 80  mg daily, BMET today.      - Wear compression stockings during the day.  3. CKD: Stage IV.  Closely followed by Dr. Justin Mend with the thought that he may need HD soon.  Slowly rising creatinine.   - BMET today.  4. HTN: Suspect bilateral renal artery stenosis plays a role. Based on review of notes it appears that intervention on renal arteries has been decided against.  BP still high.  - Increase clonidine to 0.3 bid.  5. PAD: Renal artery stenosis, mesenteric artery stenosis, distal aortic stenosis. - Continue statin. Lipids today.  6. CAD: Stable with no chest pain.  Continue statin. Goal LDL < 70.  4/16 Cardiolite with no ischemia. No ASA given need for anticoagulation.  7. Complete heart block: Now s/p St Jude PPM placement.  8. Atrial flutter: DCCV in 11/20, has been a-paced since then.  - Continue apixaban 2.5 mg bid (reduced dosing based on age and creatinine).    Followup 6 months.   Loralie Champagne 05/06/2019

## 2019-05-07 ENCOUNTER — Other Ambulatory Visit: Payer: Self-pay | Admitting: Cardiology

## 2019-05-11 ENCOUNTER — Ambulatory Visit (HOSPITAL_COMMUNITY)
Admission: RE | Admit: 2019-05-11 | Discharge: 2019-05-11 | Disposition: A | Discharge status: Home / Self Care | Payer: Medicare Other | Source: Ambulatory Visit | Attending: Cardiology | Admitting: Cardiology

## 2019-05-11 ENCOUNTER — Other Ambulatory Visit: Payer: Self-pay

## 2019-05-11 DIAGNOSIS — I6529 Occlusion and stenosis of unspecified carotid artery: Secondary | ICD-10-CM

## 2019-05-18 ENCOUNTER — Ambulatory Visit (INDEPENDENT_AMBULATORY_CARE_PROVIDER_SITE_OTHER): Payer: Medicare Other | Admitting: *Deleted

## 2019-05-18 DIAGNOSIS — Z950 Presence of cardiac pacemaker: Secondary | ICD-10-CM

## 2019-05-18 LAB — CUP PACEART REMOTE DEVICE CHECK
Battery Remaining Longevity: 83 mo
Battery Remaining Percentage: 95.5 %
Battery Voltage: 3.01 V
Brady Statistic AP VP Percent: 95 %
Brady Statistic AP VS Percent: 1 %
Brady Statistic AS VP Percent: 4.5 %
Brady Statistic AS VS Percent: 1 %
Brady Statistic RA Percent Paced: 94 %
Brady Statistic RV Percent Paced: 99 %
Date Time Interrogation Session: 20210219123916
Implantable Lead Implant Date: 20201113
Implantable Lead Implant Date: 20201113
Implantable Lead Location: 753859
Implantable Lead Location: 753860
Implantable Pulse Generator Implant Date: 20201113
Lead Channel Impedance Value: 460 Ohm
Lead Channel Impedance Value: 460 Ohm
Lead Channel Pacing Threshold Amplitude: 0.5 V
Lead Channel Pacing Threshold Amplitude: 0.75 V
Lead Channel Pacing Threshold Pulse Width: 0.4 ms
Lead Channel Pacing Threshold Pulse Width: 0.4 ms
Lead Channel Sensing Intrinsic Amplitude: 10.4 mV
Lead Channel Sensing Intrinsic Amplitude: 5 mV
Lead Channel Setting Pacing Amplitude: 1.75 V
Lead Channel Setting Pacing Amplitude: 3.5 V
Lead Channel Setting Pacing Pulse Width: 0.4 ms
Lead Channel Setting Sensing Sensitivity: 2 mV
Pulse Gen Model: 2272
Pulse Gen Serial Number: 9178446

## 2019-05-18 NOTE — Progress Notes (Signed)
PPM Remote  

## 2019-05-22 ENCOUNTER — Ambulatory Visit (INDEPENDENT_AMBULATORY_CARE_PROVIDER_SITE_OTHER): Payer: Medicare Other | Admitting: Cardiology

## 2019-05-22 ENCOUNTER — Other Ambulatory Visit: Payer: Self-pay

## 2019-05-22 ENCOUNTER — Encounter: Payer: Self-pay | Admitting: Cardiology

## 2019-05-22 VITALS — BP 152/78 | HR 60 | Ht 68.0 in | Wt 133.6 lb

## 2019-05-22 DIAGNOSIS — I701 Atherosclerosis of renal artery: Secondary | ICD-10-CM | POA: Diagnosis not present

## 2019-05-22 DIAGNOSIS — I442 Atrioventricular block, complete: Secondary | ICD-10-CM

## 2019-05-22 NOTE — Patient Instructions (Signed)
Medication Instructions:  Your physician recommends that you continue on your current medications as directed. Please refer to the Current Medication list given to you today.  *If you need a refill on your cardiac medications before your next appointment, please call your pharmacy*  Lab Work: None ordered  If you have labs (blood work) drawn today and your tests are completely normal, you will receive your results only by: Marland Kitchen MyChart Message (if you have MyChart) OR . A paper copy in the mail If you have any lab test that is abnormal or we need to change your treatment, we will call you to review the results.  Testing/Procedures: None ordered   Follow-Up: Remote monitoring is used to monitor your Pacemaker of ICD from home. This monitoring reduces the number of office visits required to check your device to one time per year. It allows [place] to keep an eye on the functioning of your device to ensure it is working properly. You are scheduled for a device check from home on 08/17/2019. You may send your transmission at any time that day. If you have a wireless device, the transmission will be sent automatically. After your physician reviews your transmission, you will receive a postcard with your next transmission date.   At [facility], you and your health needs are our priority.  As part of our continuing mission to provide you with exceptional heart care, we have created designated Provider Care Teams.  These Care Teams include your primary Cardiologist (physician) and Advanced Practice Providers (APPs -  Physician Assistants and Nurse Practitioners) who all work together to provide you with the care you need, when you need it.  Your next appointment:   9 month(s)  The format for your next appointment:   In Person  Provider:   Allegra Lai, MD  Thank you for choosing [facility]!!   Trinidad Curet, RN [phone]

## 2019-05-22 NOTE — Progress Notes (Signed)
Electrophysiology Office Note   Date:  05/22/2019   ID:  Jeffrey Campbell, DOB 12-29-1932, MRN [id number]  PCP:  [name], MD  Cardiologist:  Onalee Hua Primary Electrophysiologist:  Constance Haw, MD    Chief Complaint: pacemaker   History of Present Illness: Jeffrey Campbell is a 86 y.o. male who is being seen today for the evaluation of pacemaker at the request of [name], MD. Presenting today for electrophysiology evaluation.  Has a history significant for coronary artery disease, CKD stage III-IV, PVD, pulmonary hypertension, COPD, diastolic heart failure, dementia, and persistent atrial flutter.  He was brought to the hospital November 2020 for cardioversion.  Post cardioversion his ventricular rates were in the 30s.  Status post [facility] Jude dual-chamber pacemaker implanted 02/09/2019.  Today, he denies symptoms of palpitations, chest pain, shortness of breath, orthopnea, PND, lower extremity edema, claudication, dizziness, presyncope, syncope, bleeding, or neurologic sequela. The patient is tolerating medications without difficulties.    Past Medical History:  Diagnosis Date  . Anemia    low iron  . BPH (benign prostatic hyperplasia)   . CAD (coronary artery disease)   . Carotid artery stenosis    1-39% bilateral carotid artery stenosis and < 50% stenosis in the right CCA by dopplers 06/2017  . CKD (chronic kidney disease), stage III    Stage 4  . Diastolic dysfunction   . Glaucoma   . Graves disease   . Heart murmur, systolic   . History of ETOH abuse   . History of PFTs 05/2010   moderate airflow obstruction w reduced DLCO by PFT   . Hyperlipemia   . Hypertension   . Hyperthyroidism 08/26/10   radioactive iodine therapy   . Memory loss   . Mitral regurgitation echo 2015   mild  . Multiple thyroid nodules   . MVP (mitral valve prolapse) 11/2012   posterior MVP  . Organic impotence   . OSA (obstructive sleep apnea)    upper airway  resistance syndrome with RDI 18/hr - not on CPAP due to insurance not covering  . Pre-diabetes   . PUD (peptic ulcer disease)   . Pulmonary hypertension ([place]) echo 2015   Group 2 with pulmonary venous HTN and Group 3 with OSA  . Shutdown renal    after cardiac cath  . Upper airway resistance syndrome    Past Surgical History:  Procedure Laterality Date  . APPENDECTOMY    . AV FISTULA PLACEMENT Left 10/18/2017   Procedure: ARTERIOVENOUS (AV) FISTULA CREATION ARM;  Surgeon: Waynetta Sandy, MD;  Location: [place];  Service: Vascular;  Laterality: Left;  . CARDIAC CATHETERIZATION    . CARDIAC CATHETERIZATION N/A 02/17/2015   Procedure: Right Heart Cath;  Surgeon: Larey Dresser, MD;  Location: [place] CV LAB;  Service: Cardiovascular;  Laterality: N/A;  . CARDIOVERSION N/A 02/08/2019   Procedure: CARDIOVERSION;  Surgeon: Donato Heinz, MD;  [facility] ENDOSCOPY;  Service: Endoscopy;  Laterality: N/A;  . ESOPHAGOGASTRODUODENOSCOPY (EGD) WITH PROPOFOL Left 10/16/2016   Procedure: ESOPHAGOGASTRODUODENOSCOPY (EGD) WITH PROPOFOL;  Surgeon: Ronnette Juniper, MD;  Location: [place];  Service: Gastroenterology;  Laterality: Left;  . heart catherization    . HERNIA REPAIR  10/2009  . IR RADIOLOGIST EVAL & MGMT  09/28/2016  . IR RADIOLOGIST EVAL & MGMT  10/19/2016  . IR RADIOLOGIST EVAL & MGMT  01/18/2017  . PACEMAKER IMPLANT N/A 02/09/2019   Procedure: PACEMAKER IMPLANT;  Surgeon: Constance Haw, MD;  Location:  [place] INVASIVE CV LAB;  Service: Cardiovascular;  Laterality: N/A;  . RIGHT HEART CATH N/A 07/28/2016   Procedure: Right Heart Cath;  Surgeon: Larey Dresser, MD;  Location: [place] CV LAB;  Service: Cardiovascular;  Laterality: N/A;     Current Outpatient Medications  Medication Sig Dispense Refill  . amLODipine (NORVASC) 10 MG tablet Take 1 tablet (10 mg total) by mouth daily. 30 tablet 3  . apixaban (ELIQUIS) 2.5 MG TABS tablet Take 1 tablet (2.5 mg total)  by mouth 2 (two) times daily. 60 tablet 6  . atorvastatin (LIPITOR) 20 MG tablet Take 1 tablet (20 mg total) by mouth daily. 30 tablet 3  . Brinzolamide-Brimonidine (SIMBRINZA) 1-0.2 % SUSP Place 1 drop into both eyes 2 (two) times daily.     . calcitRIOL (ROCALTROL) 0.25 MCG capsule Take 0.25 mcg by mouth daily.    . cloNIDine (CATAPRES) 0.2 MG tablet Take 1.5 tablets (0.3 mg total) by mouth 2 (two) times daily. 90 tablet 3  . donepezil (ARICEPT) 10 MG tablet Take 1 tablet (10 mg total) by mouth at bedtime. 30 tablet 11  . doxazosin (CARDURA) 4 MG tablet Take 1 tablet (4 mg total) by mouth daily. 30 tablet 3  . hydrALAZINE (APRESOLINE) 100 MG tablet Take 1 tablet (100 mg total) by mouth 3 (three) times daily. Please make yearly appt with Dr. Radford Pax for October. 1st attempt 90 tablet 3  . levothyroxine (SYNTHROID, LEVOTHROID) 150 MCG tablet Take 150 mcg by mouth daily before breakfast.    . memantine (NAMENDA) 10 MG tablet Take 10 mg by mouth 2 (two) times daily.    Marland Kitchen torsemide (DEMADEX) 20 MG tablet TAKE 4 TABLETS (80 MG TOTAL) BY MOUTH DAILY. 360 tablet 1  . triamcinolone ointment (KENALOG) 0.5 % Apply 1 application topically 2 (two) times daily as needed (rash).    . Epoetin Alfa (PROCRIT IJ) Inject 5,000 Units as directed every 14 (fourteen) days.      No current facility-administered medications for this visit.    Allergies:   Iodinated diagnostic agents, Zocor [simvastatin], Budesonide-formoterol fumarate, Minoxidil, Tiotropium bromide monohydrate, and Tricor [fenofibrate]   Social History:  The patient  reports that he quit smoking about 35 years ago. His smoking use included cigarettes. He has a 80.00 pack-year smoking history. He has never used smokeless tobacco. He reports that he does not drink alcohol or use drugs.   Family History:  The patient's family history includes Colon cancer in his brother, maternal uncle, and mother; Hypertension in his father; Kidney failure in his father;  Lung disease in his brother.    ROS:  Please see the history of present illness.   Otherwise, review of systems is positive for none.   All other systems are reviewed and negative.    PHYSICAL EXAM: VS:  BP (!) 152/78   Pulse 60   Ht 5\' 8"  (1.727 m)   Wt 133 lb 9.6 oz (60.6 kg)   SpO2 95%   BMI 20.31 kg/m  , BMI Body mass index is 20.31 kg/m. GEN: Well nourished, well developed, in no acute distress  HEENT: normal  Neck: no JVD, carotid bruits, or masses Cardiac: RRR; no murmurs, rubs, or gallops,no edema  Respiratory:  clear to auscultation bilaterally, normal work of breathing GI: soft, nontender, nondistended, + BS MS: no deformity or atrophy  Skin: warm and dry, device pocket is well healed Neuro:  Strength and sensation are intact Psych: euthymic mood, full affect  EKG:  EKG is ordered today. Personal review of the ekg ordered shows sinus rhythm, ventricular paced  Device interrogation is reviewed today in detail.  See PaceArt for details.   Recent Labs: 09/03/2018: ALT 43 01/09/2019: NT-Pro BNP 21,225 02/09/2019: Magnesium 2.2; TSH 24.079 02/12/2019: Platelets 89 03/01/2019: Hemoglobin 10.0 05/04/2019: BUN 100; Creatinine, Ser 4.25; Potassium 3.6; Sodium 143    Lipid Panel     Component Value Date/Time   CHOL 117 05/04/2019 1215   CHOL 117 06/27/2017 0925   TRIG 61 05/04/2019 1215   HDL 49 05/04/2019 1215   HDL 60 06/27/2017 0925   CHOLHDL 2.4 05/04/2019 1215   VLDL 12 05/04/2019 1215   LDLCALC 56 05/04/2019 1215   LDLCALC 48 06/27/2017 0925     Wt Readings from Last 3 Encounters:  05/22/19 133 lb 9.6 oz (60.6 kg)  05/04/19 137 lb 9.6 oz (62.4 kg)  03/01/19 136 lb 12.8 oz (62.1 kg)      Other studies Reviewed: Additional studies/ records that were reviewed today include: TTE 02/05/19  Review of the above records today demonstrates:  1. Patient appears to be in atrial flutter with slow ventricular rate,  with pronounced fluttering of mitral valve  seen on short axis and m mode.  2. Left ventricular ejection fraction, by visual estimation, is 55 to  60%. The left ventricle has normal function. Mildly increased left  ventricular size. There is mildly increased left ventricular hypertrophy.  3. Left ventricular diastolic Doppler parameters are indeterminate  pattern of LV diastolic filling.  4. Global right ventricle has moderately reduced systolic function.The  right ventricular size is moderately enlarged. Mildly increased right  ventricular wall thickness.  5. Left atrial size was severely dilated.  6. Right atrial size was severely dilated.  7. Small pericardial effusion.  8. Moderate calcification of the mitral valve leaflet(s).  9. Moderate thickening of the mitral valve leaflet(s).  10. The mitral valve is degenerative. Trace mitral valve regurgitation.  11. The tricuspid valve is normal in structure. Tricuspid valve  regurgitation is mild.  12. The aortic valve is tricuspid Aortic valve regurgitation is trivial by  color flow Doppler. Mild aortic valve sclerosis without stenosis.  13. The pulmonic valve was normal in structure. Pulmonic valve  regurgitation is trivial by color flow Doppler.  14. Severely elevated pulmonary artery systolic pressure.  15. The inferior vena cava is dilated in size with <50% respiratory  variability, suggesting right atrial pressure of 15 mmHg.  16. Severe pulmonary hypertension, RVSP 72 mmHg.    ASSESSMENT AND PLAN:  1.  Complete heart block: Status post [employer] Jude dual-chamber pacemaker implanted 02/09/2019.  Device functioning appropriately.  No changes at this time.  2.  Hypertension: Weighted today.  Ermalinda Joubert allow her primary physician to make further changes.  3.  Atrial flutter: Status post cardioversion November 2020.  Currently on Eliquis.  CHA2DS2-VASc of at least 4.  4.  Chronic diastolic heart failure:    Current medicines are reviewed at length with the patient today.    The patient does not have concerns regarding his medicines.  The following changes were made today:  none  Labs/ tests ordered today include:  Orders Placed This Encounter  Procedures  . EKG 12-Lead     Disposition:   FU with Iqra Rotundo 9 months  Signed, Sheranda Seabrooks Meredith Leeds, MD  05/22/2019 2:50 PM     [place] [address] [phone] (office) [phone] (fax)

## 2019-05-28 LAB — CUP PACEART INCLINIC DEVICE CHECK
Battery Remaining Longevity: 117 mo
Battery Voltage: 3.01 V
Brady Statistic RA Percent Paced: 95 %
Brady Statistic RV Percent Paced: 99.45 %
Date Time Interrogation Session: 20210223142500
Implantable Lead Implant Date: 20201113
Implantable Lead Implant Date: 20201113
Implantable Lead Location: 753859
Implantable Lead Location: 753860
Implantable Pulse Generator Implant Date: 20201113
Lead Channel Impedance Value: 437.5 Ohm
Lead Channel Impedance Value: 450 Ohm
Lead Channel Pacing Threshold Amplitude: 0.75 V
Lead Channel Pacing Threshold Amplitude: 0.75 V
Lead Channel Pacing Threshold Pulse Width: 0.4 ms
Lead Channel Pacing Threshold Pulse Width: 0.4 ms
Lead Channel Sensing Intrinsic Amplitude: 2.9 mV
Lead Channel Sensing Intrinsic Amplitude: 9.5 mV
Lead Channel Setting Pacing Amplitude: 1 V
Lead Channel Setting Pacing Amplitude: 1.75 V
Lead Channel Setting Pacing Pulse Width: 0.4 ms
Lead Channel Setting Sensing Sensitivity: 2 mV
Pulse Gen Model: 2272
Pulse Gen Serial Number: 9178446

## 2019-06-13 ENCOUNTER — Telehealth: Payer: Self-pay | Admitting: Internal Medicine

## 2019-06-13 NOTE — Telephone Encounter (Signed)
Rec'd call from patient and spoke with daughter as well, and patient wanted to schedule a Palliative f/u visit.  I have scheduled an In-person f/u visit for 07/02/19 @ 11 AM

## 2019-06-16 ENCOUNTER — Other Ambulatory Visit: Payer: Self-pay | Admitting: Cardiology

## 2019-06-19 ENCOUNTER — Other Ambulatory Visit (HOSPITAL_COMMUNITY): Payer: Self-pay

## 2019-06-19 MED ORDER — APIXABAN 2.5 MG PO TABS: 2.5000 mg | ORAL_TABLET | Freq: Two times a day (BID) | ORAL | 3 refills | Status: DC

## 2019-06-29 ENCOUNTER — Other Ambulatory Visit (HOSPITAL_COMMUNITY): Payer: Self-pay | Admitting: Cardiology

## 2019-06-29 MED ORDER — ATORVASTATIN CALCIUM 20 MG PO TABS: 20.0000 mg | ORAL_TABLET | Freq: Every day | ORAL | 3 refills | Status: DC

## 2019-07-02 ENCOUNTER — Other Ambulatory Visit: Payer: Self-pay

## 2019-07-02 ENCOUNTER — Other Ambulatory Visit: Payer: Medicare Other | Admitting: Internal Medicine

## 2019-07-02 DIAGNOSIS — N189 Chronic kidney disease, unspecified: Secondary | ICD-10-CM | POA: Diagnosis not present

## 2019-07-02 DIAGNOSIS — I272 Pulmonary hypertension, unspecified: Secondary | ICD-10-CM | POA: Diagnosis not present

## 2019-07-02 DIAGNOSIS — N2581 Secondary hyperparathyroidism of renal origin: Secondary | ICD-10-CM | POA: Diagnosis not present

## 2019-07-02 DIAGNOSIS — D631 Anemia in chronic kidney disease: Secondary | ICD-10-CM | POA: Diagnosis not present

## 2019-07-02 DIAGNOSIS — I120 Hypertensive chronic kidney disease with stage 5 chronic kidney disease or end stage renal disease: Secondary | ICD-10-CM | POA: Diagnosis not present

## 2019-07-02 DIAGNOSIS — I5030 Unspecified diastolic (congestive) heart failure: Secondary | ICD-10-CM | POA: Diagnosis not present

## 2019-07-02 DIAGNOSIS — I2510 Atherosclerotic heart disease of native coronary artery without angina pectoris: Secondary | ICD-10-CM | POA: Diagnosis not present

## 2019-07-02 DIAGNOSIS — N185 Chronic kidney disease, stage 5: Secondary | ICD-10-CM | POA: Diagnosis not present

## 2019-07-02 DIAGNOSIS — J449 Chronic obstructive pulmonary disease, unspecified: Secondary | ICD-10-CM | POA: Diagnosis not present

## 2019-07-17 DIAGNOSIS — N189 Chronic kidney disease, unspecified: Secondary | ICD-10-CM | POA: Diagnosis not present

## 2019-07-17 DIAGNOSIS — K219 Gastro-esophageal reflux disease without esophagitis: Secondary | ICD-10-CM | POA: Insufficient documentation

## 2019-07-17 DIAGNOSIS — Z9889 Other specified postprocedural states: Secondary | ICD-10-CM | POA: Insufficient documentation

## 2019-07-17 DIAGNOSIS — E0500 Thyrotoxicosis with diffuse goiter without thyrotoxic crisis or storm: Secondary | ICD-10-CM | POA: Insufficient documentation

## 2019-07-17 DIAGNOSIS — N185 Chronic kidney disease, stage 5: Secondary | ICD-10-CM | POA: Diagnosis not present

## 2019-07-17 DIAGNOSIS — K4090 Unilateral inguinal hernia, without obstruction or gangrene, not specified as recurrent: Secondary | ICD-10-CM | POA: Insufficient documentation

## 2019-07-17 DIAGNOSIS — N2581 Secondary hyperparathyroidism of renal origin: Secondary | ICD-10-CM | POA: Insufficient documentation

## 2019-07-17 DIAGNOSIS — N400 Enlarged prostate without lower urinary tract symptoms: Secondary | ICD-10-CM | POA: Insufficient documentation

## 2019-07-18 DIAGNOSIS — T7840XA Allergy, unspecified, initial encounter: Secondary | ICD-10-CM | POA: Insufficient documentation

## 2019-07-18 DIAGNOSIS — R0602 Shortness of breath: Secondary | ICD-10-CM | POA: Insufficient documentation

## 2019-07-19 ENCOUNTER — Ambulatory Visit (INDEPENDENT_AMBULATORY_CARE_PROVIDER_SITE_OTHER): Payer: Medicare Other | Admitting: Neurology

## 2019-07-19 ENCOUNTER — Encounter: Payer: Self-pay | Admitting: Neurology

## 2019-07-19 ENCOUNTER — Other Ambulatory Visit: Payer: Self-pay

## 2019-07-19 VITALS — BP 130/61 | HR 65 | Temp 97.2°F | Ht 68.0 in | Wt 126.0 lb

## 2019-07-19 DIAGNOSIS — F0390 Unspecified dementia without behavioral disturbance: Secondary | ICD-10-CM | POA: Diagnosis not present

## 2019-07-19 DIAGNOSIS — Z992 Dependence on renal dialysis: Secondary | ICD-10-CM | POA: Diagnosis not present

## 2019-07-19 DIAGNOSIS — N269 Renal sclerosis, unspecified: Secondary | ICD-10-CM | POA: Diagnosis not present

## 2019-07-19 DIAGNOSIS — N186 End stage renal disease: Secondary | ICD-10-CM | POA: Diagnosis not present

## 2019-07-19 MED ORDER — DONEPEZIL HCL 10 MG PO TABS: 10.0000 mg | ORAL_TABLET | Freq: Every day | ORAL | 11 refills | Status: DC

## 2019-07-19 MED ORDER — MEMANTINE HCL 10 MG PO TABS: 10.0000 mg | ORAL_TABLET | Freq: Two times a day (BID) | ORAL | 3 refills | Status: DC

## 2019-07-19 NOTE — Patient Instructions (Signed)
Overall, memory appears stable Continue current medications  See you back as needed

## 2019-07-19 NOTE — Progress Notes (Signed)
PATIENT: Jeffrey Campbell DOB: [birth year]  REASON FOR VISIT: follow up HISTORY FROM: patient  HISTORY OF PRESENT ILLNESS: Today 07/19/19  HISTORY HISTORY  Jeffrey Campbell a 86 year old male, seen in request byhis primary care physician Dr. Dr. [name] for evaluation of memory loss, he is accompanied by his son Jeffrey Campbell today's clinical visit.  I have reviewed and summarized the referring note from the referring physician.He has past medical history of hypertension, hyperlipidemia, coronary artery disease, chronic kidney disease, prediabetes, previous alcohol use, presented with gradual onset memory loss has few years  He retired from [employer], he was noted to have gradual onset memory loss over the past couple years, he got lost while driving at the beginning of the year, he still reads newspaper at home, significant gait abnormality, today's Mini-Mental Status Examination 17 out of 30  I personally reviewed September 02, 2018:mild generalized atrophy, small vessel disease,no acute abnormalities.  Laboratory evaluations on September 07, 2018, ferritin was elevated 524, ammonia 26, CMP showed creatinine of 3.37, GFR of 16, CBC showed hemoglobin of 10.5, platelet of 126, vitamin B12 level was mildly elevated at 1095  Update January 18, 2019 SS: [name] is an 86 year old male here for follow-up for memory disturbance.  He is here today with his nephew, [name].  He feels his memory is stable.  He continues to live with his wife, he does not drive because of issues with his vision.  He is able to perform his own ADLs, he reports a good appetite.  During the day, he admits he is not very active, enjoys watching TV or taking naps.  He used to enjoy working in his lawn, but he says he is not able to do that anymore.  His wife administers his medications.  He remains on Aricept and Namenda.  He indicates he sleeps well at night.  He has not had any falls.  He does have a dialysis graft  to his left arm, but is not sure he will be undergoing dialysis or not.  He presents today for follow-up.  Update July 19, 2019 SS: Here with his daughter, indicates his memory is overall stable, has good and bad days, does get his days mixed up.  He will be starting dialysis, went today for first treatment, but couldn't be completed for some reason, has to go back tomorrow. He was hospitalized in November had pacemaker placed for complete heart block.  He remains on Aricept and Namenda.  He lives with his wife, is able to do his own ADLs.  He sleeps well and has a good appetite.  Since last seen, he has lost about 20 pounds, but has had chronic health issues, along with Covid.  No recent falls, uses a cane.  He mostly enjoys watching TV, usually stays inside. He does not drive.  REVIEW OF SYSTEMS: Out of a complete 14 system review of symptoms, the patient complains only of the following symptoms, and all other reviewed systems are negative.  Memory loss  ALLERGIES: Allergies  Allergen Reactions  . Iodinated Diagnostic Agents Other (See Comments)    Right heart cath 07/2016 allegedly "shut down his kidneys" Patient has stage III chronic kidney disease  . Zocor [Simvastatin] Other (See Comments)    Liver problems   . Budesonide-Formoterol Fumarate Other (See Comments)    Dry mouth  . Minoxidil Other (See Comments)    Unknown raction  . Tiotropium Bromide Monohydrate Other (See Comments)    Dry  mouth  . Tricor [Fenofibrate]     Unknown reaction    HOME MEDICATIONS: Outpatient Medications Prior to Visit  Medication Sig Dispense Refill  . amLODipine (NORVASC) 10 MG tablet Take 1 tablet (10 mg total) by mouth daily. 30 tablet 3  . apixaban (ELIQUIS) 2.5 MG TABS tablet Take 1 tablet (2.5 mg total) by mouth 2 (two) times daily. 180 tablet 3  . atorvastatin (LIPITOR) 20 MG tablet Take 1 tablet (20 mg total) by mouth daily. 30 tablet 3  . Brinzolamide-Brimonidine (SIMBRINZA) 1-0.2 % SUSP  Place 1 drop into both eyes 2 (two) times daily.     . calcitRIOL (ROCALTROL) 0.25 MCG capsule Take 0.25 mcg by mouth daily.    . cloNIDine (CATAPRES) 0.2 MG tablet Take 1.5 tablets (0.3 mg total) by mouth 2 (two) times daily. 90 tablet 3  . donepezil (ARICEPT) 10 MG tablet Take 1 tablet (10 mg total) by mouth at bedtime. 30 tablet 11  . doxazosin (CARDURA) 4 MG tablet Take 1 tablet (4 mg total) by mouth daily. 30 tablet 3  . Epoetin Alfa (PROCRIT IJ) Inject 5,000 Units as directed every 14 (fourteen) days.     . hydrALAZINE (APRESOLINE) 100 MG tablet Take 1 tablet (100 mg total) by mouth 3 (three) times daily. Please make yearly appt with Dr. Radford Pax for October. 1st attempt 90 tablet 3  . levothyroxine (SYNTHROID, LEVOTHROID) 150 MCG tablet Take 150 mcg by mouth daily before breakfast.    . memantine (NAMENDA) 10 MG tablet Take 10 mg by mouth 2 (two) times daily.    Marland Kitchen torsemide (DEMADEX) 20 MG tablet TAKE 4 TABLETS (80 MG TOTAL) BY MOUTH DAILY. 360 tablet 1  . triamcinolone ointment (KENALOG) 0.5 % Apply 1 application topically 2 (two) times daily as needed (rash).     No facility-administered medications prior to visit.    PAST MEDICAL HISTORY: Past Medical History:  Diagnosis Date  . Anemia    low iron  . BPH (benign prostatic hyperplasia)   . CAD (coronary artery disease)   . Carotid artery stenosis    1-39% bilateral carotid artery stenosis and < 50% stenosis in the right CCA by dopplers 06/2017  . CKD (chronic kidney disease), stage III    Stage 4  . Diastolic dysfunction   . Glaucoma   . Graves disease   . Heart murmur, systolic   . History of ETOH abuse   . History of PFTs 05/2010   moderate airflow obstruction w reduced DLCO by PFT   . Hyperlipemia   . Hypertension   . Hyperthyroidism 08/26/10   radioactive iodine therapy   . Memory loss   . Mitral regurgitation echo 2015   mild  . Multiple thyroid nodules   . MVP (mitral valve prolapse) 11/2012   posterior MVP  .  Organic impotence   . OSA (obstructive sleep apnea)    upper airway resistance syndrome with RDI 18/hr - not on CPAP due to insurance not covering  . Pre-diabetes   . PUD (peptic ulcer disease)   . Pulmonary hypertension ([place]) echo 2015   Group 2 with pulmonary venous HTN and Group 3 with OSA  . Shutdown renal    after cardiac cath  . Upper airway resistance syndrome     PAST SURGICAL HISTORY: Past Surgical History:  Procedure Laterality Date  . APPENDECTOMY    . AV FISTULA PLACEMENT Left 10/18/2017   Procedure: ARTERIOVENOUS (AV) FISTULA CREATION ARM;  Surgeon: Waynetta Sandy, MD;  Location: [facility] OR;  Service: Vascular;  Laterality: Left;  . CARDIAC CATHETERIZATION    . CARDIAC CATHETERIZATION N/A 02/17/2015   Procedure: Right Heart Cath;  Surgeon: Larey Dresser, MD;  Location: [facility];  Service: Cardiovascular;  Laterality: N/A;  . CARDIOVERSION N/A 02/08/2019   Procedure: CARDIOVERSION;  Surgeon: Donato Heinz, MD;  Location: [facility] ENDOSCOPY;  Service: Endoscopy;  Laterality: N/A;  . ESOPHAGOGASTRODUODENOSCOPY (EGD) WITH PROPOFOL Left 10/16/2016   Procedure: ESOPHAGOGASTRODUODENOSCOPY (EGD) WITH PROPOFOL;  Surgeon: Ronnette Juniper, MD;  Location: [facility];  Service: Gastroenterology;  Laterality: Left;  . heart catherization    . HERNIA REPAIR  10/2009  . IR RADIOLOGIST EVAL & MGMT  09/28/2016  . IR RADIOLOGIST EVAL & MGMT  10/19/2016  . IR RADIOLOGIST EVAL & MGMT  01/18/2017  . PACEMAKER IMPLANT N/A 02/09/2019   Procedure: PACEMAKER IMPLANT;  Surgeon: Constance Haw, MD;  Location: [facility];  Service: Cardiovascular;  Laterality: N/A;  . RIGHT HEART CATH N/A 07/28/2016   Procedure: Right Heart Cath;  Surgeon: Larey Dresser, MD;  Location: [facility];  Service: Cardiovascular;  Laterality: N/A;    FAMILY HISTORY: Family History  Problem Relation Age of Onset  . Kidney failure Father   . Hypertension Father   . Colon cancer  Mother   . Colon cancer Brother   . Lung disease Brother   . Colon cancer Maternal Uncle     SOCIAL HISTORY: Social History   Socioeconomic History  . Marital status: Married    Spouse name: Not on file  . Number of children: 4  . Years of education: completed high school  . Highest education level: Not on file  Occupational History  . 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 Smoking status: Former Smoker    Packs/day: 2.00    Years: 40.00    Pack years: 80.00    Types: Cigarettes    Quit date: 03/29/1984    Years since quitting: 35.3  . Smokeless tobacco: Never Used  Substance and Sexual Activity  . Alcohol use: No    Comment: quit in 1981  . Drug use: No  . Sexual activity: Not on file  Other Topics Concern  . Not on file  Social History Narrative   Lives at home with his wife.   Right-handed.   Occasional use of caffeine.   Social Determinants of Health   Financial Resource Strain:   . Difficulty of Paying Living Expenses:   Food Insecurity:   . Worried About Charity fundraiser in the Last Year:   . Arboriculturist in the Last Year:   Transportation Needs:   . Film/video editor (Medical):   Marland Kitchen Lack of Transportation (Non-Medical):   Physical Activity:   . Days of Exercise per Week:   . Minutes of Exercise per Session:   Stress:   . Feeling of Stress :   Social Connections:   . Frequency of Communication with Friends and Family:   . Frequency of Social Gatherings with Friends and Family:   . Attends Religious Services:   . Active Member of Clubs or Organizations:   . Attends Archivist Meetings:   Marland Kitchen Marital Status:   Intimate Partner Violence:   . Fear of Current or Ex-Partner:   . Emotionally Abused:   Marland Kitchen Physically Abused:   . Sexually Abused:  PHYSICAL EXAM  Vitals:   07/19/19 1019  BP: 130/61  Pulse: 65  Temp: (!) 97.2 F (36.2 C)  Weight: 126 lb (57.2 kg)  Height: 5\' 8"   (1.727 m)   Body mass index is 19.16 kg/m.  Generalized: Well developed, in no acute distress  MMSE - Mini Mental State Exam 07/19/2019 01/18/2019 09/18/2018  Orientation to time 0 2 1  Orientation to Place 5 4 5   Registration 3 3 3   Attention/ Calculation 2 0 0  Recall 0 1 0  Language- name 2 objects 2 2 2   Language- repeat 1 1 1   Language- follow 3 step command 2 3 3   Language- read & follow direction 1 1 1   Write a sentence 1 1 1   Copy design 0 1 0  Copy design-comments 4 animals - -  Total score 17 19 17     Neurological examination  Mentation: Alert, oriented to self and situation, history is equally provided by the patient and his daughter. Follows all commands speech and language fluent Cranial nerve II-XII: Pupils were equal round reactive to light. Extraocular movements were full, visual field were full on confrontational test. Facial sensation and strength were normal. Head turning and shoulder shrug were normal and symmetric. Motor: The motor testing reveals 5 over 5 strength of all 4 extremities. Good symmetric motor tone is noted throughout. Left dialysis graft Sensory: Sensory testing is intact to soft touch on all 4 extremities. No evidence of extinction is noted.  Coordination: Cerebellar testing reveals good finger-nose-finger and heel-to-shin bilaterally.  Gait and station: Has to push off from seated position to stand, mildly forward leaning gait, uses cane in the hallway Reflexes: Deep tendon reflexes are symmetric and normal bilaterally.   DIAGNOSTIC DATA (LABS, IMAGING, TESTING) - I reviewed patient records, labs, notes, testing and imaging myself where available.  Lab Results  Component Value Date   WBC 5.7 02/12/2019   HGB 10.0 (L) 03/01/2019   HCT 28.4 (L) 02/12/2019   MCV 100.4 (H) 02/12/2019   PLT 89 (L) 02/12/2019      Component Value Date/Time   NA 143 05/04/2019 1215   NA 144 01/10/2019 0900   K 3.6 05/04/2019 1215   CL 108 05/04/2019 1215    CO2 21 (L) 05/04/2019 1215   GLUCOSE 182 (H) 05/04/2019 1215   BUN 100 (H) 05/04/2019 1215   BUN 84 (HH) 01/10/2019 0900   CREATININE 4.25 (H) 05/04/2019 1215   CREATININE 3.10 (H) 01/13/2017 0920   CALCIUM 9.8 05/04/2019 1215   PROT 6.4 (L) 09/03/2018 0430   PROT 7.3 12/21/2016 0914   ALBUMIN 3.5 09/03/2018 0430   ALBUMIN 4.5 12/21/2016 0914   AST 26 09/03/2018 0430   ALT 43 09/03/2018 0430   ALKPHOS 61 09/03/2018 0430   BILITOT 1.3 (H) 09/03/2018 0430   BILITOT 0.9 12/21/2016 0914   GFRNONAA 12 (L) 05/04/2019 1215   GFRNONAA 18 (L) 01/13/2017 0920   GFRAA 14 (L) 05/04/2019 1215   GFRAA 21 (L) 01/13/2017 0920   Lab Results  Component Value Date   CHOL 117 05/04/2019   HDL 49 05/04/2019   LDLCALC 56 05/04/2019   TRIG 61 05/04/2019   CHOLHDL 2.4 05/04/2019   Lab Results  Component Value Date   HGBA1C 5.5 09/19/2016   Lab Results  Component Value Date   VITAMINB12 1,095 (H) 09/02/2018   Lab Results  Component Value Date   TSH 24.079 (H) 02/09/2019      ASSESSMENT AND PLAN 86  y.o. year old male  has a past medical history of Anemia, BPH (benign prostatic hyperplasia), CAD (coronary artery disease), Carotid artery stenosis, CKD (chronic kidney disease), stage III, Diastolic dysfunction, Glaucoma, Graves disease, Heart murmur, systolic, History of ETOH abuse, History of PFTs (05/2010), Hyperlipemia, Hypertension, Hyperthyroidism (08/26/10), Memory loss, Mitral regurgitation (echo 2015), Multiple thyroid nodules, MVP (mitral valve prolapse) (11/2012), Organic impotence, OSA (obstructive sleep apnea), Pre-diabetes, PUD (peptic ulcer disease), Pulmonary hypertension ([place]) (echo 2015), Shutdown renal, and Upper airway resistance syndrome. here with:  1.  Dementia -Is overall stable, MMSE was 17/30, is starting hemodialysis -Continue Namenda 10 mg twice a day -Continue Aricept 10 mg at bedtime -Weight loss of 20 lbs since last seen, likely due to chronic illness, and COVID  infection, but should be monitored as possible side effect Aricept -Will continue follow-up with primary doctor, follow-up at our office on an as-needed basis  I spent 20 minutes of face-to-face and non-face-to-face time with patient.  This included previsit chart review, lab review, study review, order entry, electronic health record documentation, patient education.   Butler Denmark, AGNP-C, DNP 07/19/2019, 10:47 AM [facility] [address] [place], [place] [address] [phone]

## 2019-07-20 DIAGNOSIS — N2581 Secondary hyperparathyroidism of renal origin: Secondary | ICD-10-CM | POA: Diagnosis not present

## 2019-07-20 DIAGNOSIS — Z992 Dependence on renal dialysis: Secondary | ICD-10-CM | POA: Diagnosis not present

## 2019-07-20 DIAGNOSIS — D688 Other specified coagulation defects: Secondary | ICD-10-CM | POA: Insufficient documentation

## 2019-07-20 DIAGNOSIS — N186 End stage renal disease: Secondary | ICD-10-CM | POA: Diagnosis not present

## 2019-07-23 DIAGNOSIS — Z992 Dependence on renal dialysis: Secondary | ICD-10-CM | POA: Diagnosis not present

## 2019-07-23 DIAGNOSIS — N2581 Secondary hyperparathyroidism of renal origin: Secondary | ICD-10-CM | POA: Diagnosis not present

## 2019-07-23 DIAGNOSIS — N186 End stage renal disease: Secondary | ICD-10-CM | POA: Diagnosis not present

## 2019-07-25 ENCOUNTER — Other Ambulatory Visit: Payer: Self-pay

## 2019-07-25 ENCOUNTER — Emergency Department (HOSPITAL_COMMUNITY): Payer: Medicare Other

## 2019-07-25 ENCOUNTER — Inpatient Hospital Stay (HOSPITAL_COMMUNITY)
Admission: EM | Admit: 2019-07-25 | Discharge: 2019-08-02 | DRG: 193 | Disposition: A | Discharge status: Home Health | Payer: Medicare Other | Attending: Internal Medicine | Admitting: Internal Medicine

## 2019-07-25 ENCOUNTER — Encounter (HOSPITAL_COMMUNITY): Payer: Self-pay | Admitting: Internal Medicine

## 2019-07-25 DIAGNOSIS — R778 Other specified abnormalities of plasma proteins: Secondary | ICD-10-CM | POA: Diagnosis present

## 2019-07-25 DIAGNOSIS — E785 Hyperlipidemia, unspecified: Secondary | ICD-10-CM | POA: Diagnosis present

## 2019-07-25 DIAGNOSIS — I2510 Atherosclerotic heart disease of native coronary artery without angina pectoris: Secondary | ICD-10-CM | POA: Diagnosis not present

## 2019-07-25 DIAGNOSIS — R54 Age-related physical debility: Secondary | ICD-10-CM | POA: Diagnosis present

## 2019-07-25 DIAGNOSIS — R410 Disorientation, unspecified: Secondary | ICD-10-CM | POA: Diagnosis not present

## 2019-07-25 DIAGNOSIS — J189 Pneumonia, unspecified organism: Secondary | ICD-10-CM | POA: Diagnosis present

## 2019-07-25 DIAGNOSIS — R4182 Altered mental status, unspecified: Secondary | ICD-10-CM | POA: Diagnosis not present

## 2019-07-25 DIAGNOSIS — I272 Pulmonary hypertension, unspecified: Secondary | ICD-10-CM | POA: Diagnosis present

## 2019-07-25 DIAGNOSIS — R0989 Other specified symptoms and signs involving the circulatory and respiratory systems: Secondary | ICD-10-CM

## 2019-07-25 DIAGNOSIS — E8889 Other specified metabolic disorders: Secondary | ICD-10-CM | POA: Diagnosis present

## 2019-07-25 DIAGNOSIS — I132 Hypertensive heart and chronic kidney disease with heart failure and with stage 5 chronic kidney disease, or end stage renal disease: Secondary | ICD-10-CM | POA: Diagnosis present

## 2019-07-25 DIAGNOSIS — F0150 Vascular dementia without behavioral disturbance: Secondary | ICD-10-CM | POA: Diagnosis present

## 2019-07-25 DIAGNOSIS — D638 Anemia in other chronic diseases classified elsewhere: Secondary | ICD-10-CM | POA: Diagnosis present

## 2019-07-25 DIAGNOSIS — E8779 Other fluid overload: Secondary | ICD-10-CM | POA: Diagnosis present

## 2019-07-25 DIAGNOSIS — N2581 Secondary hyperparathyroidism of renal origin: Secondary | ICD-10-CM | POA: Diagnosis present

## 2019-07-25 DIAGNOSIS — J988 Other specified respiratory disorders: Secondary | ICD-10-CM | POA: Diagnosis present

## 2019-07-25 DIAGNOSIS — I5032 Chronic diastolic (congestive) heart failure: Secondary | ICD-10-CM | POA: Diagnosis present

## 2019-07-25 DIAGNOSIS — Z20822 Contact with and (suspected) exposure to covid-19: Secondary | ICD-10-CM | POA: Diagnosis present

## 2019-07-25 DIAGNOSIS — Z888 Allergy status to other drugs, medicaments and biological substances status: Secondary | ICD-10-CM

## 2019-07-25 DIAGNOSIS — Z79899 Other long term (current) drug therapy: Secondary | ICD-10-CM

## 2019-07-25 DIAGNOSIS — Z7189 Other specified counseling: Secondary | ICD-10-CM | POA: Diagnosis not present

## 2019-07-25 DIAGNOSIS — I10 Essential (primary) hypertension: Secondary | ICD-10-CM | POA: Diagnosis present

## 2019-07-25 DIAGNOSIS — K551 Chronic vascular disorders of intestine: Secondary | ICD-10-CM | POA: Diagnosis present

## 2019-07-25 DIAGNOSIS — Z681 Body mass index (BMI) 19 or less, adult: Secondary | ICD-10-CM | POA: Diagnosis not present

## 2019-07-25 DIAGNOSIS — H409 Unspecified glaucoma: Secondary | ICD-10-CM | POA: Diagnosis present

## 2019-07-25 DIAGNOSIS — G9341 Metabolic encephalopathy: Secondary | ICD-10-CM | POA: Diagnosis present

## 2019-07-25 DIAGNOSIS — N186 End stage renal disease: Secondary | ICD-10-CM | POA: Diagnosis present

## 2019-07-25 DIAGNOSIS — Z79890 Hormone replacement therapy (postmenopausal): Secondary | ICD-10-CM

## 2019-07-25 DIAGNOSIS — I5030 Unspecified diastolic (congestive) heart failure: Secondary | ICD-10-CM | POA: Diagnosis not present

## 2019-07-25 DIAGNOSIS — I4581 Long QT syndrome: Secondary | ICD-10-CM | POA: Diagnosis present

## 2019-07-25 DIAGNOSIS — J440 Chronic obstructive pulmonary disease with acute lower respiratory infection: Secondary | ICD-10-CM | POA: Diagnosis not present

## 2019-07-25 DIAGNOSIS — K30 Functional dyspepsia: Secondary | ICD-10-CM | POA: Diagnosis present

## 2019-07-25 DIAGNOSIS — I739 Peripheral vascular disease, unspecified: Secondary | ICD-10-CM | POA: Diagnosis present

## 2019-07-25 DIAGNOSIS — Z91041 Radiographic dye allergy status: Secondary | ICD-10-CM

## 2019-07-25 DIAGNOSIS — Z515 Encounter for palliative care: Secondary | ICD-10-CM | POA: Diagnosis not present

## 2019-07-25 DIAGNOSIS — N269 Renal sclerosis, unspecified: Secondary | ICD-10-CM | POA: Diagnosis not present

## 2019-07-25 DIAGNOSIS — R64 Cachexia: Secondary | ICD-10-CM | POA: Diagnosis present

## 2019-07-25 DIAGNOSIS — E039 Hypothyroidism, unspecified: Secondary | ICD-10-CM | POA: Diagnosis present

## 2019-07-25 DIAGNOSIS — I517 Cardiomegaly: Secondary | ICD-10-CM | POA: Diagnosis not present

## 2019-07-25 DIAGNOSIS — D631 Anemia in chronic kidney disease: Secondary | ICD-10-CM | POA: Diagnosis present

## 2019-07-25 DIAGNOSIS — I700 Atherosclerosis of aorta: Secondary | ICD-10-CM | POA: Diagnosis present

## 2019-07-25 DIAGNOSIS — Z841 Family history of disorders of kidney and ureter: Secondary | ICD-10-CM

## 2019-07-25 DIAGNOSIS — L89151 Pressure ulcer of sacral region, stage 1: Secondary | ICD-10-CM | POA: Diagnosis present

## 2019-07-25 DIAGNOSIS — G4733 Obstructive sleep apnea (adult) (pediatric): Secondary | ICD-10-CM | POA: Diagnosis present

## 2019-07-25 DIAGNOSIS — I4892 Unspecified atrial flutter: Secondary | ICD-10-CM | POA: Diagnosis present

## 2019-07-25 DIAGNOSIS — Z950 Presence of cardiac pacemaker: Secondary | ICD-10-CM

## 2019-07-25 DIAGNOSIS — E860 Dehydration: Secondary | ICD-10-CM | POA: Diagnosis present

## 2019-07-25 DIAGNOSIS — I442 Atrioventricular block, complete: Secondary | ICD-10-CM | POA: Diagnosis present

## 2019-07-25 DIAGNOSIS — Z8711 Personal history of peptic ulcer disease: Secondary | ICD-10-CM

## 2019-07-25 DIAGNOSIS — N250 Renal osteodystrophy: Secondary | ICD-10-CM | POA: Diagnosis not present

## 2019-07-25 DIAGNOSIS — I248 Other forms of acute ischemic heart disease: Secondary | ICD-10-CM | POA: Diagnosis not present

## 2019-07-25 DIAGNOSIS — F0390 Unspecified dementia without behavioral disturbance: Secondary | ICD-10-CM | POA: Diagnosis not present

## 2019-07-25 DIAGNOSIS — Z87891 Personal history of nicotine dependence: Secondary | ICD-10-CM

## 2019-07-25 DIAGNOSIS — Z992 Dependence on renal dialysis: Secondary | ICD-10-CM | POA: Diagnosis not present

## 2019-07-25 DIAGNOSIS — J984 Other disorders of lung: Secondary | ICD-10-CM | POA: Diagnosis not present

## 2019-07-25 DIAGNOSIS — I701 Atherosclerosis of renal artery: Secondary | ICD-10-CM | POA: Diagnosis present

## 2019-07-25 DIAGNOSIS — N400 Enlarged prostate without lower urinary tract symptoms: Secondary | ICD-10-CM | POA: Diagnosis present

## 2019-07-25 DIAGNOSIS — Z7901 Long term (current) use of anticoagulants: Secondary | ICD-10-CM

## 2019-07-25 DIAGNOSIS — Z9115 Patient's noncompliance with renal dialysis: Secondary | ICD-10-CM

## 2019-07-25 DIAGNOSIS — Z8249 Family history of ischemic heart disease and other diseases of the circulatory system: Secondary | ICD-10-CM

## 2019-07-25 DIAGNOSIS — Z800 Family history of malignant neoplasm of digestive organs: Secondary | ICD-10-CM

## 2019-07-25 LAB — URINALYSIS, ROUTINE W REFLEX MICROSCOPIC
Bilirubin Urine: NEGATIVE
Glucose, UA: NEGATIVE mg/dL
Hgb urine dipstick: NEGATIVE
Ketones, ur: NEGATIVE mg/dL
Leukocytes,Ua: NEGATIVE
Nitrite: NEGATIVE
Protein, ur: 100 mg/dL — AB
Specific Gravity, Urine: 1.019 (ref 1.005–1.030)
pH: 5 (ref 5.0–8.0)

## 2019-07-25 LAB — ETHANOL: Alcohol, Ethyl (B): <10 mg/dL (ref <10)

## 2019-07-25 LAB — RESPIRATORY PANEL BY RT PCR (FLU A&B, COVID)
Influenza A by PCR: NEGATIVE
Influenza B by PCR: NEGATIVE
SARS Coronavirus 2 by RT PCR: NEGATIVE

## 2019-07-25 LAB — COMPREHENSIVE METABOLIC PANEL
ALT: 13 U/L (ref 0–44)
AST: 32 U/L (ref 15–41)
Albumin: 4.2 g/dL (ref 3.5–5.0)
Alkaline Phosphatase: 61 U/L (ref 38–126)
Anion gap: 15 (ref 5–15)
BUN: 52 mg/dL — ABNORMAL HIGH (ref 8–23)
CO2: 25 mmol/L (ref 22–32)
Calcium: 11.1 mg/dL — ABNORMAL HIGH (ref 8.9–10.3)
Chloride: 103 mmol/L (ref 98–111)
Creatinine, Ser: 5.54 mg/dL — ABNORMAL HIGH (ref 0.61–1.24)
GFR calc Af Amer: 10 mL/min — ABNORMAL LOW (ref >60)
GFR calc non Af Amer: 9 mL/min — ABNORMAL LOW (ref >60)
Glucose, Bld: 124 mg/dL — ABNORMAL HIGH (ref 70–99)
Potassium: 4.1 mmol/L (ref 3.5–5.1)
Sodium: 143 mmol/L (ref 135–145)
Total Bilirubin: 1.1 mg/dL (ref 0.3–1.2)
Total Protein: 8.1 g/dL (ref 6.5–8.1)

## 2019-07-25 LAB — I-STAT CHEM 8, ED
BUN: 48 mg/dL — ABNORMAL HIGH (ref 8–23)
Calcium, Ion: 1.3 mmol/L (ref 1.15–1.40)
Chloride: 102 mmol/L (ref 98–111)
Creatinine, Ser: 5.7 mg/dL — ABNORMAL HIGH (ref 0.61–1.24)
Glucose, Bld: 120 mg/dL — ABNORMAL HIGH (ref 70–99)
HCT: 28 % — ABNORMAL LOW (ref 39.0–52.0)
Hemoglobin: 9.5 g/dL — ABNORMAL LOW (ref 13.0–17.0)
Potassium: 4 mmol/L (ref 3.5–5.1)
Sodium: 144 mmol/L (ref 135–145)
TCO2: 27 mmol/L (ref 22–32)

## 2019-07-25 LAB — CBC WITH DIFFERENTIAL/PLATELET
Abs Immature Granulocytes: 0.03 10*3/uL (ref 0.00–0.07)
Basophils Absolute: 0 10*3/uL (ref 0.0–0.1)
Basophils Relative: 0 %
Eosinophils Absolute: 0 10*3/uL (ref 0.0–0.5)
Eosinophils Relative: 0 %
HCT: 29.7 % — ABNORMAL LOW (ref 39.0–52.0)
Hemoglobin: 9.1 g/dL — ABNORMAL LOW (ref 13.0–17.0)
Immature Granulocytes: 0 %
Lymphocytes Relative: 6 %
Lymphs Abs: 0.6 10*3/uL — ABNORMAL LOW (ref 0.7–4.0)
MCH: 29.9 pg (ref 26.0–34.0)
MCHC: 30.6 g/dL (ref 30.0–36.0)
MCV: 97.7 fL (ref 80.0–100.0)
Monocytes Absolute: 0.4 10*3/uL (ref 0.1–1.0)
Monocytes Relative: 5 %
Neutro Abs: 8.1 10*3/uL — ABNORMAL HIGH (ref 1.7–7.7)
Neutrophils Relative %: 89 %
Platelets: 164 10*3/uL (ref 150–400)
RBC: 3.04 MIL/uL — ABNORMAL LOW (ref 4.22–5.81)
RDW: 14.4 % (ref 11.5–15.5)
WBC: 9.2 10*3/uL (ref 4.0–10.5)
nRBC: 0 % (ref 0.0–0.2)

## 2019-07-25 LAB — PHOSPHORUS: Phosphorus: 5.2 mg/dL — ABNORMAL HIGH (ref 2.5–4.6)

## 2019-07-25 LAB — AMMONIA: Ammonia: 24 umol/L (ref 9–35)

## 2019-07-25 LAB — TROPONIN I (HIGH SENSITIVITY)
Troponin I (High Sensitivity): 92 ng/L — ABNORMAL HIGH (ref <18)
Troponin I (High Sensitivity): 110 ng/L (ref <18)

## 2019-07-25 LAB — TSH: TSH: 1.549 u[IU]/mL (ref 0.350–4.500)

## 2019-07-25 LAB — CBG MONITORING, ED: Glucose-Capillary: 130 mg/dL — ABNORMAL HIGH (ref 70–99)

## 2019-07-25 LAB — VITAMIN B12: Vitamin B-12: 769 pg/mL (ref 180–914)

## 2019-07-25 LAB — MAGNESIUM: Magnesium: 2.4 mg/dL (ref 1.7–2.4)

## 2019-07-25 MED ORDER — DOXAZOSIN MESYLATE 4 MG PO TABS
4.0000 mg | ORAL_TABLET | Freq: Every day | ORAL | Status: DC
  Administered 2019-07-26 – 2019-08-02 (×7): 4 mg via ORAL
  Filled 2019-07-25 (×8): qty 1

## 2019-07-25 MED ORDER — MEMANTINE HCL 10 MG PO TABS
10.0000 mg | ORAL_TABLET | Freq: Two times a day (BID) | ORAL | Status: DC
  Administered 2019-07-26 – 2019-08-02 (×15): 10 mg via ORAL
  Filled 2019-07-25 (×16): qty 1

## 2019-07-25 MED ORDER — AMLODIPINE BESYLATE 10 MG PO TABS
10.0000 mg | ORAL_TABLET | Freq: Every day | ORAL | Status: DC
  Administered 2019-07-26 – 2019-08-02 (×7): 10 mg via ORAL
  Filled 2019-07-25 (×7): qty 1

## 2019-07-25 MED ORDER — LEVOTHYROXINE SODIUM 75 MCG PO TABS
150.0000 ug | ORAL_TABLET | Freq: Every day | ORAL | Status: DC
  Administered 2019-07-27 – 2019-08-02 (×7): 150 ug via ORAL
  Filled 2019-07-25 (×2): qty 2
  Filled 2019-07-25: qty 6
  Filled 2019-07-25 (×4): qty 2

## 2019-07-25 MED ORDER — ONDANSETRON HCL 4 MG PO TABS: 4.0000 mg | ORAL_TABLET | Freq: Four times a day (QID) | ORAL | Status: DC | PRN

## 2019-07-25 MED ORDER — ACETAMINOPHEN 325 MG PO TABS
650.0000 mg | ORAL_TABLET | Freq: Four times a day (QID) | ORAL | Status: DC | PRN
  Administered 2019-07-30 – 2019-07-31 (×2): 650 mg via ORAL
  Filled 2019-07-25: qty 2

## 2019-07-25 MED ORDER — ONDANSETRON HCL 4 MG/2ML IJ SOLN: 4.0000 mg | Freq: Four times a day (QID) | INTRAMUSCULAR | Status: DC | PRN

## 2019-07-25 MED ORDER — HYDRALAZINE HCL 50 MG PO TABS
100.0000 mg | ORAL_TABLET | Freq: Three times a day (TID) | ORAL | Status: DC
  Administered 2019-07-26 – 2019-08-02 (×18): 100 mg via ORAL
  Filled 2019-07-25 (×18): qty 2

## 2019-07-25 MED ORDER — ATORVASTATIN CALCIUM 10 MG PO TABS
20.0000 mg | ORAL_TABLET | Freq: Every day | ORAL | Status: DC
  Administered 2019-07-26 – 2019-08-02 (×8): 20 mg via ORAL
  Filled 2019-07-25 (×8): qty 2

## 2019-07-25 MED ORDER — APIXABAN 2.5 MG PO TABS
2.5000 mg | ORAL_TABLET | Freq: Two times a day (BID) | ORAL | Status: DC
  Administered 2019-07-26 – 2019-07-29 (×7): 2.5 mg via ORAL
  Filled 2019-07-25 (×7): qty 1

## 2019-07-25 MED ORDER — DONEPEZIL HCL 10 MG PO TABS
10.0000 mg | ORAL_TABLET | Freq: Every day | ORAL | Status: DC
  Administered 2019-07-26 – 2019-08-01 (×7): 10 mg via ORAL
  Filled 2019-07-25 (×7): qty 1

## 2019-07-25 MED ORDER — LABETALOL HCL 5 MG/ML IV SOLN
10.0000 mg | INTRAVENOUS | Status: DC | PRN
  Administered 2019-07-25 – 2019-07-26 (×3): 10 mg via INTRAVENOUS
  Filled 2019-07-25 (×3): qty 4

## 2019-07-25 MED ORDER — ACETAMINOPHEN 650 MG RE SUPP
650.0000 mg | Freq: Four times a day (QID) | RECTAL | Status: DC | PRN
  Administered 2019-07-27: 18:00:00 650 mg via RECTAL
  Filled 2019-07-25 (×2): qty 1

## 2019-07-25 NOTE — ED Notes (Signed)
Report called to rn on 2c 

## 2019-07-25 NOTE — ED Notes (Signed)
The pt will only say his name does not follow any instructions or commands

## 2019-07-25 NOTE — ED Notes (Signed)
The pt keeps both eyes closed no family has arroved  Report given to me that dr rees had spoken to the family and they were coming to the hospital   The pt does not respond to his name  Only snuggles down in the blankets as if cold  Blankets placed  Vitals good

## 2019-07-25 NOTE — ED Provider Notes (Signed)
[facility] EMERGENCY DEPARTMENT Provider Note   CSN: [id number] Arrival date & time: 07/25/19  2458     History Chief Complaint  Patient presents with  . Altered Mental Status    Jeffrey Campbell is a 86 y.o. male.  The history is provided by the patient, the EMS personnel, a relative, the spouse and medical records. No language interpreter was used.  Altered Mental Status  Jeffrey Campbell is a 86 y.o. male who presents to the Emergency Department complaining of AMS.  Level V caveat due to AMS.  Hx is provided by EMS and the patient's wife and Jeffrey Campbell (daughter).    Per report he has been more confused for the last two days, has a hx/o dementia.  He hasn't slept in two days.  Daughter arrived late last night to assist.  He has been confused, possibly hallucinating.  He tried to use the bathroom in the living room last night.  He was using rubber bands and busy around the house.   Unsure if he took his morning meds yesterday.  Did not take his meds last night.  Bit his daughter's finger.  He has been spitting out his meds.  At 0600 tried to leave the house half dressed. He recently started on dialysis. He was dialyze successfully on Friday and Monday. They attempted to dialyze him on Tuesday but could not dialyze due to difficulty with vein access. His next scheduled session is on Thursday  This morning took amlodipine, eliquis, lipitor, doxazolin, hydralazine, levothyroxine, minothine.  Clonidine was discontinued Friday.    Pt complains of vomiting and upset stomach.      Past Medical History:  Diagnosis Date  . Anemia    low iron  . BPH (benign prostatic hyperplasia)   . CAD (coronary artery disease)   . Carotid artery stenosis    1-39% bilateral carotid artery stenosis and < 50% stenosis in the right CCA by dopplers 06/2017  . CKD (chronic kidney disease), stage III    Stage 4  . Diastolic dysfunction   . Glaucoma   . Graves disease   . Heart murmur,  systolic   . History of ETOH abuse   . History of PFTs 05/2010   moderate airflow obstruction w reduced DLCO by PFT   . Hyperlipemia   . Hypertension   . Hyperthyroidism 08/26/10   radioactive iodine therapy   . Memory loss   . Mitral regurgitation echo 2015   mild  . Multiple thyroid nodules   . MVP (mitral valve prolapse) 11/2012   posterior MVP  . Organic impotence   . OSA (obstructive sleep apnea)    upper airway resistance syndrome with RDI 18/hr - not on CPAP due to insurance not covering  . Pre-diabetes   . PUD (peptic ulcer disease)   . Pulmonary hypertension ([place]) echo 2015   Group 2 with pulmonary venous HTN and Group 3 with OSA  . Shutdown renal    after cardiac cath  . Upper airway resistance syndrome     Patient Active Problem List   Diagnosis Date Noted  . Complete heart block ([place]) 02/08/2019  . Atrial flutter ([place])   . Dementia without behavioral disturbance ([place]) 09/18/2018  . Goals of care, counseling/discussion   . Palliative care by specialist   . DNR (do not resuscitate) discussion   . Acute encephalopathy 09/01/2018  . Hypothermia 09/01/2018  . GI bleeding 10/15/2016  . GI bleed 10/15/2016  . Symptomatic  anemia   . CKD (chronic kidney disease), stage V ([place])   . Hypertensive urgency   . Vascular dementia with behavioral disturbance ([place]) 09/24/2016  . Hypertensive encephalopathy   . Branch retinal vein occlusion of right eye 05/07/2016  . Graves' orbitopathy 05/07/2016  . Primary open angle glaucoma of left eye, mild stage 05/07/2016  . Primary open angle glaucoma of right eye, severe stage 05/07/2016  . PAD (peripheral artery disease) ([place]) 04/20/2016  . Bilateral renal artery stenosis ([place]) 02/18/2015  . Pulmonary hypertension ([place]) 02/10/2015  . Chronic diastolic CHF (congestive heart failure) ([place]) 02/10/2015  . Renal bruit 01/07/2015  . Carotid artery stenosis   . Diastolic dysfunction   . Bradycardia 05/29/2013  . Glaucoma 04/09/2011  .  COPD (chronic obstructive pulmonary disease) ([place]) 09/21/2010  . OSA (obstructive sleep apnea)   . Upper airway resistance syndrome   . Hypothyroidism   . Type 2 diabetes, diet controlled ([place])   . CAD (coronary artery disease)   . Hyperlipemia   . Hypertensive heart and chronic kidney disease with heart failure and stage 1 through stage 4 chronic kidney disease, or chronic kidney disease ([place])   . PUD (peptic ulcer disease)     Past Surgical History:  Procedure Laterality Date  . APPENDECTOMY    . AV FISTULA PLACEMENT Left 10/18/2017   Procedure: ARTERIOVENOUS (AV) FISTULA CREATION ARM;  Surgeon: Waynetta Sandy, MD;  Location: ;  Service: Vascular;  Laterality: Left;  . CARDIAC CATHETERIZATION    . CARDIAC CATHETERIZATION N/A 02/17/2015   Procedure: Right Heart Cath;  Surgeon: Larey Dresser, MD;  Location: [place] CV LAB;  Service: Cardiovascular;  Laterality: N/A;  . CARDIOVERSION N/A 02/08/2019   Procedure: CARDIOVERSION;  Surgeon: Donato Heinz, MD;  Location: [facility] ENDOSCOPY;  Service: Endoscopy;  Laterality: N/A;  . ESOPHAGOGASTRODUODENOSCOPY (EGD) WITH PROPOFOL Left 10/16/2016   Procedure: ESOPHAGOGASTRODUODENOSCOPY (EGD) WITH PROPOFOL;  Surgeon: Ronnette Juniper, MD;  Location: [place];  Service: Gastroenterology;  Laterality: Left;  . heart catherization    . HERNIA REPAIR  10/2009  . IR RADIOLOGIST EVAL & MGMT  09/28/2016  . IR RADIOLOGIST EVAL & MGMT  10/19/2016  . IR RADIOLOGIST EVAL & MGMT  01/18/2017  . PACEMAKER IMPLANT N/A 02/09/2019   Procedure: PACEMAKER IMPLANT;  Surgeon: Constance Haw, MD;  Location: [place] CV LAB;  Service: Cardiovascular;  Laterality: N/A;  . RIGHT HEART CATH N/A 07/28/2016   Procedure: Right Heart Cath;  Surgeon: Larey Dresser, MD;  Location: [place] [facility] LAB;  Service: Cardiovascular;  Laterality: N/A;       Family History  Problem Relation Age of Onset  . Kidney failure Father   . Hypertension Father    . Colon cancer Mother   . Colon cancer Brother   . Lung disease Brother   . Colon cancer Maternal Uncle     Social History   Tobacco Use  . Smoking status: Former Smoker    Packs/day: 2.00    Years: 40.00    Pack years: 80.00    Types: Cigarettes    Quit date: 03/29/1984    Years since quitting: 35.3  . Smokeless tobacco: Never Used  Substance Use Topics  . Alcohol use: No    Comment: quit in 1981  . Drug use: No    Home Medications Prior to Admission medications   Medication Sig Start Date End Date Taking? Authorizing Provider  amLODipine (NORVASC) 10 MG tablet Take 1 tablet (10  mg total) by mouth daily. 02/12/19   Lyda Jester M, PA-C  apixaban (ELIQUIS) 2.5 MG TABS tablet Take 1 tablet (2.5 mg total) by mouth 2 (two) times daily. 06/19/19   Larey Dresser, MD  atorvastatin (LIPITOR) 20 MG tablet Take 1 tablet (20 mg total) by mouth daily. 06/29/19   Larey Dresser, MD  Brinzolamide-Brimonidine [facility]) 1-0.2 % SUSP Place 1 drop into both eyes 2 (two) times daily.     [provider]  calcitRIOL (ROCALTROL) 0.25 MCG capsule Take 0.25 mcg by mouth daily.    [provider]  cloNIDine (CATAPRES) 0.2 MG tablet Take 1.5 tablets (0.3 mg total) by mouth 2 (two) times daily. 05/04/19   Larey Dresser, MD  donepezil (ARICEPT) 10 MG tablet Take 1 tablet (10 mg total) by mouth at bedtime. 07/19/19   Suzzanne Cloud, NP  doxazosin (CARDURA) 4 MG tablet Take 1 tablet (4 mg total) by mouth daily. 02/12/19   Lyda Jester M, PA-C  Epoetin Alfa (PROCRIT IJ) Inject 5,000 Units as directed every 14 (fourteen) days.     [provider]  hydrALAZINE (APRESOLINE) 100 MG tablet Take 1 tablet (100 mg total) by mouth 3 (three) times daily. Please make yearly appt with Dr. Radford Pax for October. 1st attempt 02/12/19   Lyda Jester M, PA-C  levothyroxine (SYNTHROID, LEVOTHROID) 150 MCG tablet Take 150 mcg by mouth daily before breakfast.    [provider]  memantine (NAMENDA) 10 MG tablet Take 1 tablet (10 mg total) by mouth 2 (two) times daily. 07/19/19   Suzzanne Cloud, NP  torsemide (DEMADEX) 20 MG tablet TAKE 4 TABLETS (80 MG TOTAL) BY MOUTH DAILY. 04/03/19   Lyda Jester M, PA-C  triamcinolone ointment (KENALOG) 0.5 % Apply 1 application topically 2 (two) times daily as needed (rash).    [provider]    Allergies    Iodinated diagnostic agents, Zocor [simvastatin], Budesonide-formoterol fumarate, Minoxidil, Tiotropium bromide monohydrate, and Tricor [fenofibrate]  Review of Systems   Review of Systems  All other systems reviewed and are negative.   Physical Exam Updated Vital Signs BP (!) 199/60 (BP Location: Right Arm)   Pulse 73   Temp 99.1 F (37.3 C) (Oral)   Resp 18   SpO2 99%   Physical Exam Vitals and nursing note reviewed.  Constitutional:      Appearance: He is well-developed.  HENT:     Head: Normocephalic and atraumatic.  Cardiovascular:     Rate and Rhythm: Normal rate and regular rhythm.     Heart sounds: Murmur present.  Pulmonary:     Effort: Pulmonary effort is normal. No respiratory distress.     Breath sounds: Normal breath sounds.  Abdominal:     Palpations: Abdomen is soft.     Tenderness: There is no abdominal tenderness. There is no guarding or rebound.  Musculoskeletal:        General: No tenderness.  Skin:    General: Skin is warm and dry.  Neurological:     Mental Status: He is alert.     Comments: Confused.  Alert and oriented to person and place.  Generalized weakness.    Psychiatric:        Behavior: Behavior normal.     ED Results / Procedures / Treatments   Labs (all labs ordered are listed, but only abnormal results are displayed) Labs Reviewed - No data to display  EKG None  Radiology No results found.  Procedures Procedures (  including critical care time)  Medications Ordered in ED Medications - No data to display  ED Course  I have  reviewed the triage vital signs and the nursing notes.  Pertinent labs & imaging results that were available during my care of the patient were reviewed by me and considered in my medical decision making (see chart for details).    MDM Rules/Calculators/A&P                     Patient with ESR D on hemodialysis that was recently initiated as well as dementia here for evaluation of progressive confusion. Patient complains of nausea. He is very confused on evaluation with generalized weakness and low-volume speech. Troponin is mildly elevated, EKG with LVH pattern and paced rhythm. Concern that recent initiation of dialysis and medication changes may be contributing to his worsening mental status. Feel he is unsafe for discharge at this time due to his progressive confusion. Hospitalist consulted for observation. Discussed with family treatment plan and they are in agreement with plan.  Final Clinical Impression(s) / ED Diagnoses Final diagnoses:  None    Rx / DC Orders ED Discharge Orders    None       Quintella Reichert, MD 07/25/19 1714

## 2019-07-25 NOTE — H&P (Signed)
History and Physical    Jeffrey Campbell VVO:[id number] DOB: [birth year] DOA: 07/25/2019  PCP: [name], MD  Patient coming from: Home I have personally briefly reviewed patient's old medical records in [facility]  Chief Complaint: Altered mental status  HPI: Jeffrey Campbell is a 86 y.o. male with medical history significant of complete heart block status post pacemaker, atrial flutter-on Eliquis, dementia, obstructive sleep apnea-not on CPAP, chronic diastolic CHF, pulmonary hypertension, coronary artery disease, ESRD on dialysis (TTS), anemia of chronic disease, hypothyroidism, hyperlipidemia, PVD, presents to ED with altered mental status.  History is provided by patient's daughter over the phone.  She mentioned that patient has been more confused for the last 2 days.  He has not slept in last 2 days.  His confusion is getting worse possibly hallucinating.  He tried to use the bathroom in the living room last night.  Not sure if he took his medicines last night or this morning.  He fell out of his bed yesterday morning.  At 6 this morning he tried to leave the house half dressed therefore daughter called EMS and brought patient to ER for further evaluation and management.  He recently started on dialysis.  He was dialyzed successfully on Friday and Monday however they attempted to dialyze him on Tuesday but could not dialyze due to difficulty with vein access.  His next scheduled session is on Thursday.  No new medications was added however daughter mentioned that his clonidine and torsemide was discontinued recently.  No history of fever, chills, nausea, vomiting, diarrhea, chest pain, shortness of breath, palpitation, head trauma, seizures, loss of consciousness.  Patient lives with his wife at home.  No history of smoking, alcohol, illicit drug use.  ED Course: Upon arrival to ED: Patient afebrile with no leukocytosis.  BP: 199/60, UA negative for infection.  CBC shows normocytic  anemia.  COVID-19 negative, ethanol: WNL.  Troponin trended up from 92-1 10.  C CT head is negative for acute findings.  Chest x-ray shows: Cardiomegaly with central vascular engorgement with possible signs of interstitial edema.  [facility] hospitalist consulted for admission for acute metabolic encephalopathy.   Review of Systems: As per HPI otherwise negative.    Past Medical History:  Diagnosis Date  . Anemia    low iron  . BPH (benign prostatic hyperplasia)   . CAD (coronary artery disease)   . Carotid artery stenosis    1-39% bilateral carotid artery stenosis and < 50% stenosis in the right CCA by dopplers 06/2017  . CKD (chronic kidney disease), stage III    Stage 4  . Diastolic dysfunction   . Glaucoma   . Graves disease   . Heart murmur, systolic   . History of ETOH abuse   . History of PFTs 05/2010   moderate airflow obstruction w reduced DLCO by PFT   . Hyperlipemia   . Hypertension   . Hyperthyroidism 08/26/10   radioactive iodine therapy   . Memory loss   . Mitral regurgitation echo 2015   mild  . Multiple thyroid nodules   . MVP (mitral valve prolapse) 11/2012   posterior MVP  . Organic impotence   . OSA (obstructive sleep apnea)    upper airway resistance syndrome with RDI 18/hr - not on CPAP due to insurance not covering  . Pre-diabetes   . PUD (peptic ulcer disease)   . Pulmonary hypertension ([place]) echo 2015   Group 2 with pulmonary venous HTN and Group 3 with  OSA  . Shutdown renal    after cardiac cath  . Upper airway resistance syndrome     Past Surgical History:  Procedure Laterality Date  . APPENDECTOMY    . AV FISTULA PLACEMENT Left 10/18/2017   Procedure: ARTERIOVENOUS (AV) FISTULA CREATION ARM;  Surgeon: Waynetta Sandy, MD;  Location: [facility];  Service: Vascular;  Laterality: Left;  . CARDIAC CATHETERIZATION    . CARDIAC CATHETERIZATION N/A 02/17/2015   Procedure: Right Heart Cath;  Surgeon: Larey Dresser, MD;  Location: [facility];   Service: Cardiovascular;  Laterality: N/A;  . CARDIOVERSION N/A 02/08/2019   Procedure: CARDIOVERSION;  Surgeon: Donato Heinz, MD;  Location: [facility] ENDOSCOPY;  Service: Endoscopy;  Laterality: N/A;  . ESOPHAGOGASTRODUODENOSCOPY (EGD) WITH PROPOFOL Left 10/16/2016   Procedure: ESOPHAGOGASTRODUODENOSCOPY (EGD) WITH PROPOFOL;  Surgeon: Ronnette Juniper, MD;  Location: [facility];  Service: Gastroenterology;  Laterality: Left;  . heart catherization    . HERNIA REPAIR  10/2009  . IR RADIOLOGIST EVAL & MGMT  09/28/2016  . IR RADIOLOGIST EVAL & MGMT  10/19/2016  . IR RADIOLOGIST EVAL & MGMT  01/18/2017  . PACEMAKER IMPLANT N/A 02/09/2019   Procedure: PACEMAKER IMPLANT;  Surgeon: Constance Haw, MD;  Location: [facility];  Service: Cardiovascular;  Laterality: N/A;  . RIGHT HEART CATH N/A 07/28/2016   Procedure: Right Heart Cath;  Surgeon: Larey Dresser, MD;  Location: [facility];  Service: Cardiovascular;  Laterality: N/A;     reports that he quit smoking about 35 years ago. His smoking use included cigarettes. He has a 80.00 pack-year smoking history. He has never used smokeless tobacco. He reports that he does not drink alcohol or use drugs.  Allergies  Allergen Reactions  . Iodinated Diagnostic Agents Other (See Comments)    Right heart cath 07/2016 allegedly "shut down his kidneys" Patient has stage III chronic kidney disease  . Zocor [Simvastatin] Other (See Comments)    Liver problems   . Budesonide-Formoterol Fumarate Other (See Comments)    Dry mouth  . Minoxidil Other (See Comments)    Unknown raction  . Tiotropium Bromide Monohydrate Other (See Comments)    Dry mouth  . Tricor [Fenofibrate]     Unknown reaction    Family History  Problem Relation Age of Onset  . Kidney failure Father   . Hypertension Father   . Colon cancer Mother   . Colon cancer Brother   . Lung disease Brother   . Colon cancer Maternal Uncle     Prior to Admission medications     Medication Sig Start Date End Date Taking? Authorizing Provider  amLODipine (NORVASC) 10 MG tablet Take 1 tablet (10 mg total) by mouth daily. 02/12/19   Lyda Jester M, PA-C  apixaban (ELIQUIS) 2.5 MG TABS tablet Take 1 tablet (2.5 mg total) by mouth 2 (two) times daily. 06/19/19   Larey Dresser, MD  atorvastatin (LIPITOR) 20 MG tablet Take 1 tablet (20 mg total) by mouth daily. 06/29/19   Larey Dresser, MD  Brinzolamide-Brimonidine [facility]) 1-0.2 % SUSP Place 1 drop into both eyes 2 (two) times daily.     [provider]  calcitRIOL (ROCALTROL) 0.25 MCG capsule Take 0.25 mcg by mouth daily.    [provider]  cloNIDine (CATAPRES) 0.2 MG tablet Take 1.5 tablets (0.3 mg total) by mouth 2 (two) times daily. 05/04/19   Larey Dresser, MD  donepezil (ARICEPT) 10 MG tablet Take 1 tablet (  10 mg total) by mouth at bedtime. 07/19/19   Suzzanne Cloud, NP  doxazosin (CARDURA) 4 MG tablet Take 1 tablet (4 mg total) by mouth daily. 02/12/19   Lyda Jester M, PA-C  Epoetin Alfa (PROCRIT IJ) Inject 5,000 Units as directed every 14 (fourteen) days.     [provider]  hydrALAZINE (APRESOLINE) 100 MG tablet Take 1 tablet (100 mg total) by mouth 3 (three) times daily. Please make yearly appt with Dr. Radford Pax for October. 1st attempt 02/12/19   Lyda Jester M, PA-C  levothyroxine (SYNTHROID, LEVOTHROID) 150 MCG tablet Take 150 mcg by mouth daily before breakfast.    [provider]  losartan (COZAAR) 50 MG tablet Take 50 mg by mouth 2 (two) times daily. 07/20/19   [provider]  memantine (NAMENDA) 10 MG tablet Take 1 tablet (10 mg total) by mouth 2 (two) times daily. 07/19/19   Suzzanne Cloud, NP  torsemide (DEMADEX) 20 MG tablet TAKE 4 TABLETS (80 MG TOTAL) BY MOUTH DAILY. Patient taking differently: Take 80 mg by mouth daily.  04/03/19   Lyda Jester M, PA-C  triamcinolone ointment (KENALOG) 0.5 % Apply 1 application topically 2 (two) times  daily as needed (rash).    [provider]    Physical Exam: Vitals:   07/25/19 1330 07/25/19 1415 07/25/19 1445 07/25/19 1530  BP: 110/82 101/85 98/84 (!) 145/74  Pulse: 70 72 67 67  Resp: 18 16 13 15   Temp:      TempSrc:      SpO2: 96% 97% 98% 96%    Constitutional: Sleepy but arousable goes back to sleep very easily.  On room air.  Appears cachectic and dehydrated. Eyes: PERRL, lids and conjunctivae normal ENMT: Mucous membranes are dry. Posterior pharynx clear of any exudate or lesions.Normal dentition.  Neck: normal, supple, no masses, no thyromegaly Respiratory: clear to auscultation bilaterally, no wheezing, no crackles. Normal respiratory effort. No accessory muscle use.  Cardiovascular: Regular rate and rhythm, no murmurs / rubs / gallops. No extremity edema. 2+ pedal pulses. No carotid bruits.  Abdomen: no tenderness, no masses palpated. No hepatosplenomegaly. Bowel sounds positive.  Musculoskeletal: no clubbing / cyanosis. No joint deformity upper and lower extremities. Good ROM, no contractures. Normal muscle tone.  Skin: no rashes, lesions, ulcers. No induration Neurologic: Unable to perform neurological exam as patient was confused.   Labs on Admission: I have personally reviewed following labs and imaging studies  CBC: Recent Labs  Lab 07/25/19 1117 07/25/19 1125  WBC 9.2  --   NEUTROABS 8.1*  --   HGB 9.1* 9.5*  HCT 29.7* 28.0*  MCV 97.7  --   PLT 164  --    Basic Metabolic Panel: Recent Labs  Lab 07/25/19 1117 07/25/19 1125  NA 143 144  K 4.1 4.0  CL 103 102  CO2 25  --   GLUCOSE 124* 120*  BUN 52* 48*  CREATININE 5.54* 5.70*  CALCIUM 11.1*  --    GFR: Estimated Creatinine Clearance: 7.8 mL/min (A) (by C-G formula based on SCr of 5.7 mg/dL (H)). Liver Function Tests: Recent Labs  Lab 07/25/19 1117  AST 32  ALT 13  ALKPHOS 61  BILITOT 1.1  PROT 8.1  ALBUMIN 4.2   No results for input(s): LIPASE, AMYLASE in the last 168  hours. No results for input(s): AMMONIA in the last 168 hours. Coagulation Profile: No results for input(s): INR, PROTIME in the last 168 hours. Cardiac Enzymes: No results for input(s):  CKTOTAL, CKMB, CKMBINDEX, TROPONINI in the last 168 hours. BNP (last 3 results) Recent Labs    01/09/19 1019  PROBNP 21,225*   HbA1C: No results for input(s): HGBA1C in the last 72 hours. CBG: Recent Labs  Lab 07/25/19 1039  GLUCAP 130*   Lipid Profile: No results for input(s): CHOL, HDL, LDLCALC, TRIG, CHOLHDL, LDLDIRECT in the last 72 hours. Thyroid Function Tests: No results for input(s): TSH, T4TOTAL, FREET4, T3FREE, THYROIDAB in the last 72 hours. Anemia Panel: No results for input(s): VITAMINB12, FOLATE, FERRITIN, TIBC, IRON, RETICCTPCT in the last 72 hours. Urine analysis:    Component Value Date/Time   COLORURINE YELLOW 07/25/2019 1116   APPEARANCEUR HAZY (A) 07/25/2019 1116   LABSPEC 1.019 07/25/2019 1116   PHURINE 5.0 07/25/2019 1116   GLUCOSEU NEGATIVE 07/25/2019 1116   [place] 07/25/2019 1116   [place] 07/25/2019 1116   [place] 07/25/2019 1116   PROTEINUR 100 (A) 07/25/2019 1116   NITRITE NEGATIVE 07/25/2019 1116   LEUKOCYTESUR NEGATIVE 07/25/2019 1116    Radiological Exams on Admission: DG Chest 2 View  Result Date: 07/25/2019 CLINICAL DATA:  Altered mental status EXAM: CHEST - 2 VIEW COMPARISON:  02/10/2019 FINDINGS: RIGHT-sided dual lead pacer device remains in place. Cardiomediastinal contours with persistent cardiac enlargement. Central vascular engorgement with possible signs of interstitial edema. No dense consolidation. No sign of pleural effusion. Osteopenia and mild kyphosis. Limited assessment of skeletal structures otherwise unremarkable. IMPRESSION: Cardiomegaly with central vascular engorgement and possible signs of interstitial edema. Electronically Signed   By: Zetta Bills M.D.   On: 07/25/2019 11:12   CT Head Wo  Contrast  Result Date: 07/25/2019 CLINICAL DATA:  Altered status with increase in disorientation EXAM: CT HEAD WITHOUT CONTRAST TECHNIQUE: Contiguous axial images were obtained from the base of the skull through the vertex without intravenous contrast. COMPARISON:  September 01, 2018 FINDINGS: Brain: There is stable moderate diffuse atrophy. There is no appreciable intracranial mass, hemorrhage, extra-axial fluid collection, or midline shift. There is small vessel disease throughout the centra semiovale bilaterally, similar in appearance compared to most recent study. No acute appearing infarct is demonstrable on this study. Vascular: There is no hyperdense vessel. There is calcification in each distal vertebral artery and carotid siphon region. Skull: The bony calvarium appears intact. Sinuses/Orbits: There is mucosal thickening in each inferior maxillary antrum. There is mucosal thickening in several ethmoid air cells. Orbits appear symmetric bilaterally. Other: Mastoid air cells are clear. IMPRESSION: Stable atrophy with periventricular small vessel disease. No acute infarct. No mass or hemorrhage. There are foci of arterial vascular calcification. Areas of paranasal sinus disease also noted. Electronically Signed   By: Lowella Grip III M.D.   On: 07/25/2019 12:30    EKG: Independently reviewed.  Sinus rhythm, prolonged PR interval, LVH, prolonged QT interval, no ST elevation or depression noted.  Assessment/Plan Principal Problem:   Acute metabolic encephalopathy Active Problems:   OSA (obstructive sleep apnea)   Hypothyroidism   Anemia of chronic disease   Hypertension   ESRD (end stage renal disease) (HCC)   Elevated troponin    Acute metabolic encephalopathy: -Unknown etiology?  Could be uremia versus worsening dementia -He is afebrile with no leukocytosis.  Maintaining oxygen saturation on room air.  CT head without contrast negative for acute findings.  Chest x-ray shows cardiomegaly  with vascular engorgement and possible signs of interstitial edema. -Admit patient to stepdown unit for close monitoring. -COVID-19 negative, ethanol: WNL.  UA negative for infection. -Check UDS,  TSH, B12, folate, ammonia, RPR -We will keep him n.p.o. -Consulted PT/OT/SLP -On fall/aspiration precautions -Monitor vitals closely.  Elevated troponin: -Likely demand ischemia -Reviewed EKG.  Trend troponin.  ESRD on hemodialysis: -Potassium: WNL.  EDP consulted nephrology  Hypertension with fluid overload: Blood pressure elevated upon arrival -Continue home meds of amlodipine, hydralazine, doxazosin and monitor blood pressure closely -EDP consulted nephrology for dialysis.  Hyperlipidemia/PVD: Continue statin  Anemia of chronic disease: H&H is stable. -Monitor for now.  Hypothyroidism: Check TSH -Continue levothyroxine  Atrial flutter: On Eliquis  Obstructive sleep apnea-does not use CPAP at home.  Complete heart block status post pacemaker  Dementia: -Continue Namenda and Aricept  Chronic diastolic CHF: Patient appears euvolemic and dehydrated on exam. -Chest x-ray shows fluid overload likely secondary to missed dialysis. -Strict INO's and daily weight.   -Reviewed echo from 01/05/2019 which showed preserved ejection fraction of 55 to 60%. -Monitor electrolytes.  DVT prophylaxis: Eliquis/TED/SCD  code Status: Full code-confirmed with the patient daughter Family Communication: None present at bedside.  Plan of care discussed with patient daughter over the phone in length and she verbalized understanding and agreed with it. Disposition Plan: To be determined Consults called: Nephrology by EDP  admission status: Inpatient   Mckinley Jewel MD [facility]   If 7PM-7AM, please contact night-coverage [email or web address] Password [facility]  07/25/2019, 4:14 PM

## 2019-07-25 NOTE — ED Triage Notes (Signed)
Pt here from home for evaluation of ams onset yesterday morning. Hx dementia but wife and daughter state he is less oriented than usual, wandering around the house, up all night putting rubber bands on things around the house. Family concerned he's had a medication change that has caused this. T, Th, S dialysis pt, per EMS has missed the last two tx.

## 2019-07-25 NOTE — ED Notes (Signed)
Patient transported to X-ray 

## 2019-07-26 LAB — COMPREHENSIVE METABOLIC PANEL
ALT: 14 U/L (ref 0–44)
AST: 33 U/L (ref 15–41)
Albumin: 3.8 g/dL (ref 3.5–5.0)
Alkaline Phosphatase: 60 U/L (ref 38–126)
Anion gap: 12 (ref 5–15)
BUN: 58 mg/dL — ABNORMAL HIGH (ref 8–23)
CO2: 26 mmol/L (ref 22–32)
Calcium: 10.5 mg/dL — ABNORMAL HIGH (ref 8.9–10.3)
Chloride: 106 mmol/L (ref 98–111)
Creatinine, Ser: 5.64 mg/dL — ABNORMAL HIGH (ref 0.61–1.24)
GFR calc Af Amer: 10 mL/min — ABNORMAL LOW (ref >60)
GFR calc non Af Amer: 8 mL/min — ABNORMAL LOW (ref >60)
Glucose, Bld: 128 mg/dL — ABNORMAL HIGH (ref 70–99)
Potassium: 3.7 mmol/L (ref 3.5–5.1)
Sodium: 144 mmol/L (ref 135–145)
Total Bilirubin: 1.4 mg/dL — ABNORMAL HIGH (ref 0.3–1.2)
Total Protein: 7.2 g/dL (ref 6.5–8.1)

## 2019-07-26 LAB — URINE DRUGS OF ABUSE SCREEN W ALC, ROUTINE (REF LAB)
Amphetamines, Urine: NEGATIVE ng/mL
Barbiturate, Ur: NEGATIVE ng/mL
Benzodiazepine Quant, Ur: NEGATIVE ng/mL
Cannabinoid Quant, Ur: NEGATIVE ng/mL
Cocaine (Metab.): NEGATIVE ng/mL
Ethanol U, Quan: NEGATIVE %
Methadone Screen, Urine: NEGATIVE ng/mL
Opiate Quant, Ur: NEGATIVE ng/mL
Phencyclidine, Ur: NEGATIVE ng/mL
Propoxyphene, Urine: NEGATIVE ng/mL

## 2019-07-26 LAB — CBC
HCT: 29.9 % — ABNORMAL LOW (ref 39.0–52.0)
Hemoglobin: 9.2 g/dL — ABNORMAL LOW (ref 13.0–17.0)
MCH: 30.5 pg (ref 26.0–34.0)
MCHC: 30.8 g/dL (ref 30.0–36.0)
MCV: 99 fL (ref 80.0–100.0)
Platelets: 173 10*3/uL (ref 150–400)
RBC: 3.02 MIL/uL — ABNORMAL LOW (ref 4.22–5.81)
RDW: 14.6 % (ref 11.5–15.5)
WBC: 9.9 10*3/uL (ref 4.0–10.5)
nRBC: 0 % (ref 0.0–0.2)

## 2019-07-26 LAB — GLUCOSE, CAPILLARY: Glucose-Capillary: 114 mg/dL — ABNORMAL HIGH (ref 70–99)

## 2019-07-26 LAB — FOLATE RBC
Folate, Hemolysate: 363 ng/mL
Folate, RBC: 1310 ng/mL (ref >498)
Hematocrit: 27.7 % — ABNORMAL LOW (ref 37.5–51.0)

## 2019-07-26 LAB — RPR: RPR Ser Ql: NONREACTIVE

## 2019-07-26 LAB — MRSA PCR SCREENING: MRSA by PCR: NEGATIVE

## 2019-07-26 LAB — TROPONIN I (HIGH SENSITIVITY): Troponin I (High Sensitivity): 135 ng/L (ref <18)

## 2019-07-26 MED ORDER — SODIUM CHLORIDE 0.9 % IV SOLN: 100.0000 mL | INTRAVENOUS | Status: DC | PRN

## 2019-07-26 MED ORDER — CHLORHEXIDINE GLUCONATE CLOTH 2 % EX PADS
6.0000 | MEDICATED_PAD | Freq: Every day | CUTANEOUS | Status: DC
  Administered 2019-07-26 – 2019-08-02 (×5): 6 via TOPICAL

## 2019-07-26 MED ORDER — NITROGLYCERIN 2 % TD OINT
1.0000 [in_us] | TOPICAL_OINTMENT | Freq: Four times a day (QID) | TRANSDERMAL | Status: AC
  Administered 2019-07-26: 1 [in_us] via TOPICAL
  Filled 2019-07-26: qty 30

## 2019-07-26 MED ORDER — PENTAFLUOROPROP-TETRAFLUOROETH EX AERO: 1.0000 "application " | INHALATION_SPRAY | CUTANEOUS | Status: DC | PRN

## 2019-07-26 MED ORDER — LIDOCAINE-PRILOCAINE 2.5-2.5 % EX CREA: 1.0000 "application " | TOPICAL_CREAM | CUTANEOUS | Status: DC | PRN

## 2019-07-26 NOTE — Progress Notes (Signed)
Patient had a witnessed, assisted fall to floor landing on his bottom at 0405.  Patient denies injury.  Dr. Myna Hidalgo notified of fall. VSS. Post fall huddle performed. Patient remains in low bed with bed alarm on and fall mats in place. Will continue to monitor.

## 2019-07-26 NOTE — Evaluation (Signed)
Occupational Therapy Evaluation Patient Details Name: Jeffrey Campbell MRN: [id number] DOB: [birth year] Today's Date: 07/26/2019    History of Present Illness 86 yo admitted with increased confusion and fall at home with additional fall am of 4/29. Pt with encephalopathy and euvolemic. PMHx: dementia, complete heart block s/p PPM, pulm HTN, OSA, dementia, Graves dz, glaucoma, CAD, ESRD (TTS), PVD, BPH, Aflutter   Clinical Impression   Pt admitted with above diagnoses, presenting with deficits in cognition, activity tolerance, and strength to engage in BADL at desired level of independence. At time of session, met pt in hallway with PT. Pt completed beyond household level of functional mobility with min guard assist and RW. Pt then stood at sink to complete oral care with min A. Noted deficits in initiation, termination, and sequencing with basic tasks. Upon returning to the chair, pt incontinent of stool, requiring max A to complete peri care/clean up. Recommend pt to have 24/7 supervision for BADL safety, in SNF if family cannot provide needed care. Will continue to follow per POC listed below.    Follow Up Recommendations  SNF;Supervision/Assistance - 24 hour;Other (comment)(pending family support)    Equipment Recommendations  3 in 1 bedside commode    Recommendations for Other Services       Precautions / Restrictions Precautions Precautions: Fall Restrictions Weight Bearing Restrictions: No      Mobility Bed Mobility               General bed mobility comments: up in hallway with PT  Transfers Overall transfer level: Needs assistance Equipment used: Rolling walker (2 wheeled) Transfers: Sit to/from [facility] Sit to Stand: Min assist Stand pivot transfers: Min assist       General transfer comment: min A with and without RW to rise and steady due to poor safety awareness    Balance Overall balance assessment: Needs assistance Sitting-balance  support: Feet supported Sitting balance-Leahy Scale: Fair     Standing balance support: Bilateral upper extremity supported;During functional activity Standing balance-Leahy Scale: Poor                             ADL either performed or assessed with clinical judgement   ADL Overall ADL's : Needs assistance/impaired Eating/Feeding: Set up;Sitting;Cueing for sequencing   Grooming: Minimal assistance;Standing;Oral care;Cueing for sequencing Grooming Details (indicate cue type and reason): cues to sequence through entirety of task Upper Body Bathing: Minimal assistance;Cueing for sequencing;Sitting   Lower Body Bathing: Minimal assistance;Sit to/from stand;Sitting/lateral leans;Cueing for sequencing   Upper Body Dressing : Minimal assistance;Sitting;Cueing for sequencing   Lower Body Dressing: Minimal assistance;Sit to/from stand;Sitting/lateral leans;Cueing for sequencing   Toilet Transfer: Minimal assistance;BSC;Ambulation;Cueing for safety;Cueing for sequencing   Toileting- Clothing Manipulation and Hygiene: Maximal assistance;Total assistance;Sit to/from stand Toileting - Clothing Manipulation Details (indicate cue type and reason): incontinent of stool in session Tub/ Shower Transfer: Minimal assistance;Shower seat;Ambulation;Rolling walker;Cueing for safety;Cueing for sequencing   Functional mobility during ADLs: Min guard;Minimal assistance;Cueing for safety;Cueing for sequencing       Vision Patient Visual Report: No change from baseline       Perception     Praxis      Pertinent Vitals/Pain       Hand Dominance     Extremity/Trunk Assessment Upper Extremity Assessment Upper Extremity Assessment: Generalized weakness   Lower Extremity Assessment Lower Extremity Assessment: Generalized weakness       Communication Communication Communication: No difficulties  Cognition Arousal/Alertness: Awake/alert Behavior During Therapy: Flat  affect Overall Cognitive Status: History of cognitive impairments - at baseline                                 General Comments: history of dementia at baseline. Pt not oriented to situation, often states he does not remember or understand questions/commands. Pt needed sequencing and initiation cues for basic BADL tasks   General Comments       Exercises     Shoulder Instructions      Home Living Family/patient expects to be discharged to:: Private residence Living Arrangements: Spouse/significant other Available Help at Discharge: Family;Available 24 hours/day Type of Home: House Home Access: Level entry     Home Layout: One level     Bathroom Shower/Tub: Teacher, early years/pre: Standard     Home Equipment: Cane - single point;Shower seat          Prior Functioning/Environment Level of Independence: Needs assistance  Gait / Transfers Assistance Needed: pt reports he walks with a cane ADL's / Homemaking Assistance Needed: not able to recall, suspect he was recieving assist at least for cognitive factor   Comments: home setup from prior admission        OT Problem List: Decreased strength;Decreased knowledge of use of DME or AE;Decreased activity tolerance;Decreased cognition;Cardiopulmonary status limiting activity;Impaired balance (sitting and/or standing);Decreased safety awareness      OT Treatment/Interventions: Self-care/ADL training;Therapeutic exercise;Patient/family education;Balance training;Energy conservation;Therapeutic activities;DME and/or AE instruction;Cognitive remediation/compensation    OT Goals(Current goals can be found in the care plan section) Acute Rehab OT Goals OT Goal Formulation: Patient unable to participate in goal setting Time For Goal Achievement: 08/09/19 Potential to Achieve Goals: Fair  OT Frequency: Min 2X/week   Barriers to D/C:            Co-evaluation PT/OT/SLP Co-Evaluation/Treatment:  Yes Reason for Co-Treatment: Necessary to address cognition/behavior during functional activity;To address functional/ADL transfers   OT goals addressed during session: ADL's and self-care      AM-PAC OT "6 Clicks" Daily Activity     Outcome Measure Help from another person eating meals?: A Little Help from another person taking care of personal grooming?: A Little Help from another person toileting, which includes using toliet, bedpan, or urinal?: A Lot Help from another person bathing (including washing, rinsing, drying)?: A Lot Help from another person to put on and taking off regular upper body clothing?: A Little Help from another person to put on and taking off regular lower body clothing?: A Lot 6 Click Score: 15   End of Session Equipment Utilized During Treatment: Rolling walker Nurse Communication: Mobility status  Activity Tolerance: Patient tolerated treatment well Patient left: in chair;with call bell/phone within reach;with chair alarm set  OT Visit Diagnosis: Unsteadiness on feet (R26.81);Other abnormalities of gait and mobility (R26.89);Muscle weakness (generalized) (M62.81);Other symptoms and signs involving cognitive function                Time: 9381-8299 OT Time Calculation (min): 25 min Charges:  OT General Charges $OT Visit: 1 Visit OT Evaluation $OT Eval Moderate Complexity: [place], MSOT, OTR/L Acute Rehabilitation Services [facility] Office Number: [phone] Pager: [phone]  Zenovia Jarred 07/26/2019, 11:49 AM

## 2019-07-26 NOTE — Progress Notes (Signed)
PROGRESS NOTE  KOLBEE BOGUSZ BPZ:[id number] DOB: 1932-11-08 DOA: 07/25/2019 PCP: [name], MD  HPI/Recap of past 24 hours: REGINOLD BEALE is a 86 y.o. male with medical history significant of complete heart block status post pacemaker, atrial flutter-on Eliquis, dementia, obstructive sleep apnea-not on CPAP, chronic diastolic CHF, pulmonary hypertension, coronary artery disease, ESRD on dialysis (TTS), anemia of chronic disease, hypothyroidism, hyperlipidemia, PVD, presents to ED with altered mental status.  History is provided by patient's daughter over the phone.  Patient has been more confused for the last 2 days.  He has not slept in last 2 days.  His confusion is getting worse possibly hallucinating.  He tried to use the bathroom in the living room last night.  Not sure if he took his medicines last night or this morning.  He fell out of his bed yesterday morning.  At 6 this morning he tried to leave the house half dressed therefore daughter called EMS and brought patient to ER for further evaluation and management.  He recently started hemodialysis.  He was dialyzed successfully on Friday and Monday however they attempted to dialyze him on Tuesday but could not dialyze due to difficulty with vein access.  His next scheduled session is on Thursday 07/26/19.  Patient lives with his wife at home.  No history of smoking, alcohol, illicit drug use.  ED Course: Upon arrival to ED: Patient afebrile with no leukocytosis.  BP: 199/60, UA negative for infection.  CBC shows normocytic anemia.  COVID-19 negative, ethanol: WNL.  Troponin trended up from 92-1 10.  C CT head is negative for acute findings.  Chest x-ray shows: Cardiomegaly with central vascular engorgement with possible signs of interstitial edema.  [facility] hospitalist consulted for admission for acute metabolic encephalopathy.  07/26/19: Seen and examined at his bedside this morning.  He is alert but confused.  He denies any  dizziness, chest pain, palpitations, dyspnea, nausea, abdominal pain.  Plan for HD today.   Assessment/Plan: Principal Problem:   Acute metabolic encephalopathy Active Problems:   OSA (obstructive sleep apnea)   Hypothyroidism   Anemia of chronic disease   Hypertension   ESRD (end stage renal disease) (HCC)   Elevated troponin   Acute metabolic encephalopathy with unclear etiology History of dementia CT head nonacute UDS in process TSH normal Ammonia level normal No evidence of active infective process  Elevated troponin Peaked at 132 12 lead EKG showing ST T depression Repeat 12 leads EKG HD today He denies any anginal symptoms Will consult cardiology in the AM  QTC prolongation QTC >500 Repeat EKG and avoid qtc prolonging agents  Severe pulmonary hypertension RSVP 72 as seen on 2 D echo done on 01/05/19 Will need cardiology follow up  Hypothyroidism TSH normal Continue levothyroxine  ESRD on HD TTS HD planned today  Elevated T bili Unclear etiology T bili 1.4 Repeat labs AM  CdCHF Fluid addressed by HD Continue cardiac meds  PVD/HLD Continue statin  Dementia C/w home meds  Essential HTN BP stable for HD C/w home meds  OSA Does not use CPAP at home   DVT prophylaxis: Eliquis/TED/SCD  code Status: Full code-confirmed with the patient daughter  Family Communication: None at bedside.  Disposition Plan: From home.  Anticipate dc to Home vs SNF pending PT OT eval and hemodynamic stability.    Consults called: Nephrology by EDP     Objective: Vitals:   07/26/19 0720 07/26/19 0817 07/26/19 1124 07/26/19 1125  BP: (!) 174/69 Marland Kitchen)  174/72 (!) 179/83 (!) 179/83  Pulse: 60 63 64   Resp: 16 18 19    Temp: 98.3 F (36.8 C) 98.7 F (37.1 C) 98.6 F (37 C)   TempSrc: Axillary Oral Oral   SpO2: 100% 97% 98%   Weight:      Height:        Intake/Output Summary (Last 24 hours) at 07/26/2019 1446 Last data filed at 07/25/2019 2244 Gross per  24 hour  Intake 20 ml  Output --  Net 20 ml   Filed Weights   07/25/19 2205 07/26/19 0428  Weight: 49.9 kg 50.8 kg    Exam:  . General: 86 y.o. year-old male well developed well nourished in no acute distress.  Alert and confused. . Cardiovascular: Regular rate and rhythm with no rubs or gallops.  Loud systolic murmur.  Marland Kitchen Respiratory: Clear to auscultation with no wheezes or rales. Good inspiratory effort. . Abdomen: Soft nontender nondistended with normal bowel sounds x4 quadrants. . Musculoskeletal: No lower extremity edema. 2/4 pulses in all 4 extremities. Marland Kitchen Psychiatry: Mood is appropriate for condition and setting   Data Reviewed: CBC: Recent Labs  Lab 07/25/19 1117 07/25/19 1125 07/26/19 0254  WBC 9.2  --  9.9  NEUTROABS 8.1*  --   --   HGB 9.1* 9.5* 9.2*  HCT 29.7* 28.0* 29.9*  MCV 97.7  --  99.0  PLT 164  --  161   Basic Metabolic Panel: Recent Labs  Lab 07/25/19 1117 07/25/19 1125 07/25/19 2321 07/26/19 0254  NA 143 144  --  144  K 4.1 4.0  --  3.7  CL 103 102  --  106  CO2 25  --   --  26  GLUCOSE 124* 120*  --  128*  BUN 52* 48*  --  58*  CREATININE 5.54* 5.70*  --  5.64*  CALCIUM 11.1*  --   --  10.5*  MG  --   --  2.4  --   PHOS  --   --  5.2*  --    GFR: Estimated Creatinine Clearance: 7 mL/min (A) (by C-G formula based on SCr of 5.64 mg/dL (H)). Liver Function Tests: Recent Labs  Lab 07/25/19 1117 07/26/19 0254  AST 32 33  ALT 13 14  ALKPHOS 61 60  BILITOT 1.1 1.4*  PROT 8.1 7.2  ALBUMIN 4.2 3.8   No results for input(s): LIPASE, AMYLASE in the last 168 hours. Recent Labs  Lab 07/25/19 1602  AMMONIA 24   Coagulation Profile: No results for input(s): INR, PROTIME in the last 168 hours. Cardiac Enzymes: No results for input(s): CKTOTAL, CKMB, CKMBINDEX, TROPONINI in the last 168 hours. BNP (last 3 results) Recent Labs    01/09/19 1019  PROBNP 21,225*   HbA1C: No results for input(s): HGBA1C in the last 72  hours. CBG: Recent Labs  Lab 07/25/19 1039 07/26/19 0137  GLUCAP 130* 114*   Lipid Profile: No results for input(s): CHOL, HDL, LDLCALC, TRIG, CHOLHDL, LDLDIRECT in the last 72 hours. Thyroid Function Tests: Recent Labs    07/25/19 1602  TSH 1.549   Anemia Panel: Recent Labs    07/25/19 1604  VITAMINB12 769   Urine analysis:    Component Value Date/Time   COLORURINE YELLOW 07/25/2019 1116   APPEARANCEUR HAZY (A) 07/25/2019 1116   LABSPEC 1.019 07/25/2019 1116   PHURINE 5.0 07/25/2019 1116   [place] 07/25/2019 1116   [place] 07/25/2019 1116   [place] 07/25/2019 1116  KETONESUR NEGATIVE 07/25/2019 1116   PROTEINUR 100 (A) 07/25/2019 1116   NITRITE NEGATIVE 07/25/2019 1116   LEUKOCYTESUR NEGATIVE 07/25/2019 1116   Sepsis Labs: @LABRCNTIP (procalcitonin:4,lacticidven:4)  ) Recent Results (from the past 240 hour(s))  Respiratory Panel by RT PCR (Flu A&B, Covid) - Nasopharyngeal Swab     Status: None   Collection Time: 07/25/19 12:36 PM   Specimen: Nasopharyngeal Swab  Result Value Ref Range Status   SARS Coronavirus 2 by RT PCR NEGATIVE NEGATIVE Final    Comment: (NOTE) SARS-CoV-2 target nucleic acids are NOT DETECTED. The SARS-CoV-2 RNA is generally detectable in upper respiratoy specimens during the acute phase of infection. The lowest concentration of SARS-CoV-2 viral copies this assay can detect is 131 copies/mL. A negative result does not preclude SARS-Cov-2 infection and should not be used as the sole basis for treatment or other patient management decisions. A negative result may occur with  improper specimen collection/handling, submission of specimen other than nasopharyngeal swab, presence of viral mutation(s) within the areas targeted by this assay, and inadequate number of viral copies (<131 copies/mL). A negative result must be combined with clinical observations, patient history, and epidemiological information.  The expected result is Negative. Fact Sheet for Patients:  PinkCheek.be Fact Sheet for Healthcare Providers:  GravelBags.it This test is not yet ap proved or cleared by the [place] FDA and  has been authorized for detection and/or diagnosis of SARS-CoV-2 by FDA under an Emergency Use Authorization (EUA). This EUA will remain  in effect (meaning this test can be used) for the duration of the COVID-19 declaration under Section 564(b)(1) of the Act, 21 U.S.C. section 360bbb-3(b)(1), unless the authorization is terminated or revoked sooner.    Influenza A by PCR NEGATIVE NEGATIVE Final   Influenza B by PCR NEGATIVE NEGATIVE Final    Comment: (NOTE) The Xpert Xpress SARS-CoV-2/FLU/RSV assay is intended as an aid in  the diagnosis of influenza from Nasopharyngeal swab specimens and  should not be used as a sole basis for treatment. Nasal washings and  aspirates are unacceptable for Xpert Xpress SARS-CoV-2/FLU/RSV  testing. Fact Sheet for Patients: PinkCheek.be Fact Sheet for Healthcare Providers: GravelBags.it This test is not yet approved or cleared by the [place] FDA and  has been authorized for detection and/or diagnosis of SARS-CoV-2 by  FDA under an Emergency Use Authorization (EUA). This EUA will remain  in effect (meaning this test can be used) for the duration of the  Covid-19 declaration under Section 564(b)(1) of the Act, 21  U.S.C. section 360bbb-3(b)(1), unless the authorization is  terminated or revoked. Performed at [facility], [place] [address] [place], [place] [address]   MRSA PCR Screening     Status: None   Collection Time: 07/25/19 10:06 PM   Specimen: Nasal Mucosa; Nasopharyngeal  Result Value Ref Range Status   MRSA by PCR NEGATIVE NEGATIVE Final    Comment:        The GeneXpert MRSA Assay (FDA approved for NASAL  specimens only), is one component of a comprehensive MRSA colonization surveillance program. It is not intended to diagnose MRSA infection nor to guide or monitor treatment for MRSA infections. Performed at [facility], [place] [address] [place], [place] [address]       Studies: No results found.  Scheduled Meds: . amLODipine  10 mg Oral Daily  . apixaban  2.5 mg Oral BID  . atorvastatin  20 mg Oral Daily  . Chlorhexidine Gluconate Cloth  6 each  Topical Q0600  . donepezil  10 mg Oral QHS  . doxazosin  4 mg Oral Daily  . hydrALAZINE  100 mg Oral TID  . levothyroxine  150 mcg Oral QAC breakfast  . memantine  10 mg Oral BID    Continuous Infusions: . sodium chloride    . sodium chloride       LOS: 1 day     Kayleen Memos, MD [facility] Pager [phone]  If 7PM-7AM, please contact night-coverage [email or web address] Password [facility] 07/26/2019, 2:46 PM

## 2019-07-26 NOTE — Plan of Care (Signed)

## 2019-07-26 NOTE — Procedures (Signed)
I was present at this dialysis session. I have reviewed the session itself and made appropriate changes.   Vital signs in last 24 hours:  Temp:  [98.3 F (36.8 C)-98.7 F (37.1 C)] 98.6 F (37 C) (04/29 1124) Pulse Rate:  [59-70] 64 (04/29 1124) Resp:  [13-23] 19 (04/29 1124) BP: (94-204)/(67-185) 179/83 (04/29 1125) SpO2:  [92 %-100 %] 98 % (04/29 1124) Weight:  [49.9 kg-50.8 kg] 50.8 kg (04/29 0428) Weight change:  Filed Weights   07/25/19 2205 07/26/19 0428  Weight: 49.9 kg 50.8 kg    Recent Labs  Lab 07/25/19 1125 07/25/19 2321 07/26/19 0254  NA   < >  --  144  K   < >  --  3.7  CL   < >  --  106  CO2   < >  --  26  GLUCOSE   < >  --  128*  BUN   < >  --  58*  CREATININE   < >  --  5.64*  CALCIUM   < >  --  10.5*  PHOS  --  5.2*  --    < > = values in this interval not displayed.    Recent Labs  Lab 07/25/19 1117 07/25/19 1125 07/26/19 0254  WBC 9.2  --  9.9  NEUTROABS 8.1*  --   --   HGB 9.1* 9.5* 9.2*  HCT 29.7* 28.0* 29.9*  MCV 97.7  --  99.0  PLT 164  --  173    Scheduled Meds: . amLODipine  10 mg Oral Daily  . apixaban  2.5 mg Oral BID  . atorvastatin  20 mg Oral Daily  . Chlorhexidine Gluconate Cloth  6 each Topical Q0600  . donepezil  10 mg Oral QHS  . doxazosin  4 mg Oral Daily  . hydrALAZINE  100 mg Oral TID  . levothyroxine  150 mcg Oral QAC breakfast  . memantine  10 mg Oral BID   Continuous Infusions: . sodium chloride    . sodium chloride     PRN Meds:.sodium chloride, sodium chloride, acetaminophen **OR** acetaminophen, labetalol, lidocaine-prilocaine, ondansetron **OR** ondansetron (ZOFRAN) IV, pentafluoroprop-tetrafluoroeth   Donetta Potts,  MD 07/26/2019, 2:33 PM

## 2019-07-26 NOTE — Consult Note (Addendum)
[facility] Renal Consultation Note    Indication for Consultation:  Management of ESRD/hemodialysis, anemia, hypertension/volume, and secondary hyperparathyroidism. PCP:  HPI: Jeffrey Campbell is a 86 y.o. male with new ESRD (just started HD last week), Hx complete heart block (s/p pacemaker), A-flutter (on Eliquis), CHF, Hx pulm HTN, CAD, hypothyroidism, PVD, and severe dementia who was admitted with AMS.   Family not available in room, he is sitting up in a recliner. He does not know why he is in the hospital other than his wife and daughter brought him. He is oriented to person only. All other questions, he says "I don't know." Per notes, daughter reported that the patient has not slept in 2 days and has been increasingly worse with hallucinations. He has tried to leave the house partially dressed and attempted to urinating in the living room. For this reason, the family brought him to the ED on 4/28. He has also had a fall at home and looks like a fall this morning at the hospital. Per notes, no new medications recently.   Initial labs in the ED showed Na 143, K 4.1, Glu 124, BUN 52, Cr 5.54, Ca 11.1, Alb 4.2, normal LFTs, Trop 92, WBC 9.2, Hgb 9.1, Plts 164, TSH 1.549 (normal). He had a head CT which small vessel disease and sinus inflammation, no acute issues. CXR showed cardiomegaly and pulmonary edema.  Just started HD last week - has a well developed LUE AVF but had cannulation issues with 1st treatment (infiltration). Able to have two 2-hr sessions, on 4/23 and 4/26.  Past Medical History:  Diagnosis Date  . Anemia    low iron  . BPH (benign prostatic hyperplasia)   . CAD (coronary artery disease)   . Carotid artery stenosis    1-39% bilateral carotid artery stenosis and < 50% stenosis in the right CCA by dopplers 06/2017  . CKD (chronic kidney disease), stage III    Stage 4  . Diastolic dysfunction   . Glaucoma   . Graves disease   . Heart murmur, systolic   .  History of ETOH abuse   . History of PFTs 05/2010   moderate airflow obstruction w reduced DLCO by PFT   . Hyperlipemia   . Hypertension   . Hyperthyroidism 08/26/10   radioactive iodine therapy   . Memory loss   . Mitral regurgitation echo 2015   mild  . Multiple thyroid nodules   . MVP (mitral valve prolapse) 11/2012   posterior MVP  . Organic impotence   . OSA (obstructive sleep apnea)    upper airway resistance syndrome with RDI 18/hr - not on CPAP due to insurance not covering  . Pre-diabetes   . PUD (peptic ulcer disease)   . Pulmonary hypertension ([place]) echo 2015   Group 2 with pulmonary venous HTN and Group 3 with OSA  . Shutdown renal    after cardiac cath  . Upper airway resistance syndrome    Past Surgical History:  Procedure Laterality Date  . APPENDECTOMY    . AV FISTULA PLACEMENT Left 10/18/2017   Procedure: ARTERIOVENOUS (AV) FISTULA CREATION ARM;  Surgeon: Waynetta Sandy, MD;  Location: [place];  Service: Vascular;  Laterality: Left;  . CARDIAC CATHETERIZATION    . CARDIAC CATHETERIZATION N/A 02/17/2015   Procedure: Right Heart Cath;  Surgeon: Larey Dresser, MD;  Location: [place] [facility];  Service: Cardiovascular;  Laterality: N/A;  . CARDIOVERSION N/A 02/08/2019   Procedure: CARDIOVERSION;  Surgeon:  Donato Heinz, MD;  Location: [facility] ENDOSCOPY;  Service: Endoscopy;  Laterality: N/A;  . ESOPHAGOGASTRODUODENOSCOPY (EGD) WITH PROPOFOL Left 10/16/2016   Procedure: ESOPHAGOGASTRODUODENOSCOPY (EGD) WITH PROPOFOL;  Surgeon: Ronnette Juniper, MD;  Location: [place];  Service: Gastroenterology;  Laterality: Left;  . heart catherization    . HERNIA REPAIR  10/2009  . IR RADIOLOGIST EVAL & MGMT  09/28/2016  . IR RADIOLOGIST EVAL & MGMT  10/19/2016  . IR RADIOLOGIST EVAL & MGMT  01/18/2017  . PACEMAKER IMPLANT N/A 02/09/2019   Procedure: PACEMAKER IMPLANT;  Surgeon: Constance Haw, MD;  Location: [place] [facility];  Service: Cardiovascular;   Laterality: N/A;  . RIGHT HEART CATH N/A 07/28/2016   Procedure: Right Heart Cath;  Surgeon: Larey Dresser, MD;  Location: [place] [facility];  Service: Cardiovascular;  Laterality: N/A;   Family History  Problem Relation Age of Onset  . Kidney failure Father   . Hypertension Father   . Colon cancer Mother   . Colon cancer Brother   . Lung disease Brother   . Colon cancer Maternal Uncle    Social History:  reports that he quit smoking about 35 years ago. His smoking use included cigarettes. He has a 80.00 pack-year smoking history. He has never used smokeless tobacco. He reports that he does not drink alcohol or use drugs.  ROS: As per HPI otherwise unable to obtain - patient says "I don't know" to most questions.  Physical Exam: Vitals:   07/26/19 0428 07/26/19 0530 07/26/19 0720 07/26/19 0817  BP:  (!) 183/67 (!) 174/69 (!) 174/72  Pulse:   60 63  Resp:   16 18  Temp:   98.3 F (36.8 C) 98.7 F (37.1 C)  TempSrc:   Axillary Oral  SpO2:   100% 97%  Weight: 50.8 kg     Height:         General: Frail man, NAD. Confused. Head: Normocephalic, atraumatic, sclera non-icteric. Neck: Supple without lymphadenopathy/masses. JVD not elevated. Lungs: Clear bilaterally to auscultation without wheezes, rales, or rhonchi. Breathing is unlabored. Heart: RRR; 2/6 murmur Abdomen: Soft, non-tender, non-distended with normoactive bowel sounds. No rebound/guarding. Musculoskeletal:  Strength and tone appear normal for age. Lower extremities: No edema or ischemic changes, no open wounds. Neuro: Awake, oriented to person only. Dialysis Access: LUE AVF + bruit/thrill, seems quite developed - some bruising present.   Allergies  Allergen Reactions  . Iodinated Diagnostic Agents Other (See Comments)    Right heart cath 07/2016 allegedly "shut down his kidneys" Patient has stage III chronic kidney disease  . Zocor [Simvastatin] Other (See Comments)    Liver problems   . Budesonide-Formoterol  Fumarate Other (See Comments)    Dry mouth  . Minoxidil Other (See Comments)    Unknown raction  . Tiotropium Bromide Monohydrate Other (See Comments)    Dry mouth  . Tricor [Fenofibrate]     Unknown reaction   Prior to Admission medications   Medication Sig Start Date End Date Taking? Authorizing Provider  amLODipine (NORVASC) 10 MG tablet Take 1 tablet (10 mg total) by mouth daily. 02/12/19  Yes Lyda Jester M, PA-C  apixaban (ELIQUIS) 2.5 MG TABS tablet Take 1 tablet (2.5 mg total) by mouth 2 (two) times daily. 06/19/19  Yes Larey Dresser, MD  atorvastatin (LIPITOR) 20 MG tablet Take 1 tablet (20 mg total) by mouth daily. 06/29/19  Yes Larey Dresser, MD  Brinzolamide-Brimonidine [facility]) 1-0.2 % SUSP Place 1 drop into both  eyes 2 (two) times daily.    Yes [provider]  donepezil (ARICEPT) 10 MG tablet Take 1 tablet (10 mg total) by mouth at bedtime. 07/19/19  Yes Suzzanne Cloud, NP  doxazosin (CARDURA) 4 MG tablet Take 1 tablet (4 mg total) by mouth daily. Patient taking differently: Take 4 mg by mouth 2 (two) times daily.  02/12/19  Yes Lyda Jester M, PA-C  hydrALAZINE (APRESOLINE) 100 MG tablet Take 1 tablet (100 mg total) by mouth 3 (three) times daily. Please make yearly appt with Dr. Radford Pax for October. 1st attempt 02/12/19  Yes Lyda Jester M, PA-C  levothyroxine (SYNTHROID, LEVOTHROID) 150 MCG tablet Take 150 mcg by mouth daily before breakfast.   Yes [provider]  memantine (NAMENDA) 10 MG tablet Take 1 tablet (10 mg total) by mouth 2 (two) times daily. 07/19/19  Yes Suzzanne Cloud, NP  triamcinolone ointment (KENALOG) 0.5 % Apply 1 application topically 2 (two) times daily as needed (rash).   Yes [provider]  cloNIDine (CATAPRES) 0.2 MG tablet Take 1.5 tablets (0.3 mg total) by mouth 2 (two) times daily. Patient not taking: Reported on 07/25/2019 05/04/19   Larey Dresser, MD  Epoetin Alfa (PROCRIT IJ) Inject 5,000 Units  as directed every 14 (fourteen) days.     [provider]  torsemide (DEMADEX) 20 MG tablet TAKE 4 TABLETS (80 MG TOTAL) BY MOUTH DAILY. Patient not taking: No sig reported 04/03/19   Consuelo Pandy, PA-C   Current Facility-Administered Medications  Medication Dose Route Frequency Provider Last Rate Last Admin  . acetaminophen (TYLENOL) tablet 650 mg  650 mg Oral Q6H PRN Pahwani, Rinka R, MD       Or  . acetaminophen (TYLENOL) suppository 650 mg  650 mg Rectal Q6H PRN Pahwani, Rinka R, MD      . amLODipine (NORVASC) tablet 10 mg  10 mg Oral Daily Pahwani, Rinka R, MD      . apixaban (ELIQUIS) tablet 2.5 mg  2.5 mg Oral BID Pahwani, Rinka R, MD      . atorvastatin (LIPITOR) tablet 20 mg  20 mg Oral Daily Pahwani, Rinka R, MD      . donepezil (ARICEPT) tablet 10 mg  10 mg Oral QHS Pahwani, Rinka R, MD      . doxazosin (CARDURA) tablet 4 mg  4 mg Oral Daily Pahwani, Rinka R, MD      . hydrALAZINE (APRESOLINE) tablet 100 mg  100 mg Oral TID Pahwani, Rinka R, MD      . labetalol (NORMODYNE) injection 10 mg  10 mg Intravenous Q2H PRN Opyd, Ilene Qua, MD   10 mg at 07/26/19 0506  . levothyroxine (SYNTHROID) tablet 150 mcg  150 mcg Oral QAC breakfast Pahwani, Rinka R, MD      . memantine (NAMENDA) tablet 10 mg  10 mg Oral BID Pahwani, Rinka R, MD      . ondansetron (ZOFRAN) tablet 4 mg  4 mg Oral Q6H PRN Pahwani, Rinka R, MD       Or  . ondansetron (ZOFRAN) injection 4 mg  4 mg Intravenous Q6H PRN Pahwani, Michell Heinrich, MD       Labs: Basic Metabolic Panel: Recent Labs  Lab 07/25/19 1117 07/25/19 1125 07/25/19 2321 07/26/19 0254  NA 143 144  --  144  K 4.1 4.0  --  3.7  CL 103 102  --  106  CO2 25  --   --  26  GLUCOSE  124* 120*  --  128*  BUN 52* 48*  --  58*  CREATININE 5.54* 5.70*  --  5.64*  CALCIUM 11.1*  --   --  10.5*  PHOS  --   --  5.2*  --    Liver Function Tests: Recent Labs  Lab 07/25/19 1117 07/26/19 0254  AST 32 33  ALT 13 14  ALKPHOS 61 60  BILITOT 1.1  1.4*  PROT 8.1 7.2  ALBUMIN 4.2 3.8   Recent Labs  Lab 07/25/19 1602  AMMONIA 24   CBC: Recent Labs  Lab 07/25/19 1117 07/25/19 1125 07/26/19 0254  WBC 9.2  --  9.9  NEUTROABS 8.1*  --   --   HGB 9.1* 9.5* 9.2*  HCT 29.7* 28.0* 29.9*  MCV 97.7  --  99.0  PLT 164  --  173   Studies/Results: DG Chest 2 View  Result Date: 07/25/2019 CLINICAL DATA:  Altered mental status EXAM: CHEST - 2 VIEW COMPARISON:  02/10/2019 FINDINGS: RIGHT-sided dual lead pacer device remains in place. Cardiomediastinal contours with persistent cardiac enlargement. Central vascular engorgement with possible signs of interstitial edema. No dense consolidation. No sign of pleural effusion. Osteopenia and mild kyphosis. Limited assessment of skeletal structures otherwise unremarkable. IMPRESSION: Cardiomegaly with central vascular engorgement and possible signs of interstitial edema. Electronically Signed   By: Zetta Bills M.D.   On: 07/25/2019 11:12   CT Head Wo Contrast  Result Date: 07/25/2019 CLINICAL DATA:  Altered status with increase in disorientation EXAM: CT HEAD WITHOUT CONTRAST TECHNIQUE: Contiguous axial images were obtained from the base of the skull through the vertex without intravenous contrast. COMPARISON:  September 01, 2018 FINDINGS: Brain: There is stable moderate diffuse atrophy. There is no appreciable intracranial mass, hemorrhage, extra-axial fluid collection, or midline shift. There is small vessel disease throughout the centra semiovale bilaterally, similar in appearance compared to most recent study. No acute appearing infarct is demonstrable on this study. Vascular: There is no hyperdense vessel. There is calcification in each distal vertebral artery and carotid siphon region. Skull: The bony calvarium appears intact. Sinuses/Orbits: There is mucosal thickening in each inferior maxillary antrum. There is mucosal thickening in several ethmoid air cells. Orbits appear symmetric bilaterally. Other:  Mastoid air cells are clear. IMPRESSION: Stable atrophy with periventricular small vessel disease. No acute infarct. No mass or hemorrhage. There are foci of arterial vascular calcification. Areas of paranasal sinus disease also noted. Electronically Signed   By: Lowella Grip III M.D.   On: 07/25/2019 12:30   Dialysis Orders:  TTS at [facility] - just started, has only had 2 sessions so far (4/23, 4/26) 2:30hr, BFR 250 (time/BFR being advanced as new patient), 2K/2Ca, AVF, no heparin - No venofer or ESA yet, Hgb there 11.1 - Calcitriol 0.6mcg PO q HD   Assessment/Plan: 1.  AMS + severe underlying dementia: No specific acute causes found at this time - some labs pending. Has had 2 HD sessions, so doubt uremia playing a role. Discussed with daughter today that if MS does not improve or worsens, then he may not be a safe candidate for dialysis. She understands the concern - wants HD for now. Will need ongoing discussions pending course of AMS. 2.  ESRD: New start - 1st HD 4/23 as outpatient, last dialyzed Monday 4/26. Labs without acute electrolyte issues but + pulm edema on CXR (albeit asymptomatic) - will plan for dialysis today. Has had 2 infiltration events at the OP clinic - AVF seems very  developed - discussed with daughter may need TDC placed if cannot stick it here - she thinks cannulation issues were tech dependent, would rather not get [facility] unless absolutely needed. 3.  Hypertension/volume: BP high - Hx RAS. Pulm edema on CXR, UF as tolerated. 4.  Anemia of ESRD: Hgb 9.2 - will start ESA. 5.  Metabolic bone disease: Ca high - d/c VDRA. Phos ok. 6.  Nutrition: Alb surprisingly good, follow. 7.  ^ Troponin: Suspect demand ischemia - no CP. 8. A-flutter: On Eliquis.  Veneta Penton, PA-C 07/26/2019, 10:47 AM  [facility]  I have seen and examined this patient and agree with plan and assessment in the above note with renal recommendations/intervention highlighted.  Per daughter,  Mr. Harkless has had worsening of his mental status over the last few days and has not been sleeping.  His LUE AVF infiltrated for his first HD session on 07/19/19 but able to cannulate the next day.  He has had ongoing issues with infiltrations but our RN was able to cannulate today.  He may require TDC and VVS evaluation of AVF.  Currently he is comfortable and on HD without issues.    Governor Rooks Eliam Snapp,MD 07/26/2019 2:31 PM

## 2019-07-26 NOTE — Evaluation (Signed)
Physical Therapy Evaluation Patient Details Name: Jeffrey Campbell MRN: [id number] DOB: February 24, 1933 Today's Date: 07/26/2019   History of Present Illness  86 yo admitted with increased confusion and fall at home with additional fall am of 4/29. Pt with encephalopathy and euvolemic. PMHx: dementia, complete heart block s/p PPM, pulm HTN, OSA, dementia, Graves dz, glaucoma, CAD, ESRD (TTS), PVD, BPH, Aflutter  Clinical Impression  On arrival pt supine in urine soaked bed. Pt able to state he used the bathroom in the bed but had not been able to recognize need for assist. Pt assisted to EOB and standing for bathing and linen change. Pt able to walk in hall and return to chair with pt then incontinent of bowel with additional assist for bathing and linen change. No family present to confirm home setup, PLOF or assist at D/C. Pt with decreased strength, balance, cognition and functional activity who will benefit from acute therapy to maximize mobility and safety to decrease burden of care.      Follow Up Recommendations Home health PT;Supervision/Assistance - 24 hour    Equipment Recommendations  Rolling walker with 5" wheels(pt reports current RW is wife's)    Recommendations for Other Services       Precautions / Restrictions Precautions Precautions: Fall Precaution Comments: incontinent Restrictions Weight Bearing Restrictions: No      Mobility  Bed Mobility Overal bed mobility: Needs Assistance Bed Mobility: Supine to Sit     Supine to sit: Min assist     General bed mobility comments: cues for sequence with increased time and assist to elevate trunk from surface  Transfers Overall transfer level: Needs assistance Equipment used: Rolling walker (2 wheeled) Transfers: Sit to/from [facility] Sit to Stand: Min assist Stand pivot transfers: Min assist       General transfer comment: min A with and without RW to rise and steady due to poor safety awareness from  bed, BSC and recliner with rise x 4 and pivot x 2  Ambulation/Gait Ambulation/Gait assistance: Min guard Gait Distance (Feet): 250 Feet Assistive device: Rolling walker (2 wheeled) Gait Pattern/deviations: Step-through pattern;Decreased stride length;Trunk flexed   Gait velocity interpretation: >2.62 ft/sec, indicative of community ambulatory General Gait Details: cues for posture, position in RW and safety. Pt unable to recall room number or use cues to find room  Stairs            Wheelchair Mobility    Modified Rankin (Stroke Patients Only)       Balance Overall balance assessment: Needs assistance;History of Falls Sitting-balance support: Feet supported Sitting balance-Leahy Scale: Fair     Standing balance support: Bilateral upper extremity supported;During functional activity Standing balance-Leahy Scale: Poor Standing balance comment: standing at sink to brush teeth and stand from chair with single UE support, bil UE support for gait                             Pertinent Vitals/Pain Pain Assessment: No/denies pain    Home Living Family/patient expects to be discharged to:: Private residence Living Arrangements: Spouse/significant other Available Help at Discharge: Family;Available 24 hours/day Type of Home: House Home Access: Level entry     Home Layout: One level Home Equipment: Cane - single point;Shower seat      Prior Function Level of Independence: Needs assistance   Gait / Transfers Assistance Needed: pt reports he walks with a cane  ADL's / Homemaking Assistance Needed: not able to  recall, suspect he was recieving assist at least for cognitive factor  Comments: home setup from prior admission     Hand Dominance        Extremity/Trunk Assessment   Upper Extremity Assessment Upper Extremity Assessment: Defer to OT evaluation    Lower Extremity Assessment Lower Extremity Assessment: Generalized weakness    Cervical /  Trunk Assessment Cervical / Trunk Assessment: Kyphotic  Communication   Communication: No difficulties  Cognition Arousal/Alertness: Awake/alert Behavior During Therapy: Flat affect Overall Cognitive Status: History of cognitive impairments - at baseline                                 General Comments: history of dementia at baseline. Pt not oriented to situation, often states he does not remember or understand questions/commands. Pt needed sequencing and initiation cues for basic BADL tasks      General Comments      Exercises     Assessment/Plan    PT Assessment Patient needs continued PT services  PT Problem List Decreased mobility;Decreased safety awareness;Decreased strength;Decreased activity tolerance;Decreased cognition;Decreased balance;Decreased knowledge of use of DME       PT Treatment Interventions DME instruction;Therapeutic exercise;Gait training;Stair training;Functional mobility training;Therapeutic activities;Patient/family education;Balance training    PT Goals (Current goals can be found in the Care Plan section)  Acute Rehab PT Goals PT Goal Formulation: Patient unable to participate in goal setting Time For Goal Achievement: 08/09/19 Potential to Achieve Goals: Fair    Frequency Min 3X/week   Barriers to discharge Decreased caregiver support unsure of family support    Co-evaluation   Reason for Co-Treatment: Necessary to address cognition/behavior during functional activity;To address functional/ADL transfers   OT goals addressed during session: ADL's and self-care       AM-PAC PT "6 Clicks" Mobility  Outcome Measure Help needed turning from your back to your side while in a flat bed without using bedrails?: A Little Help needed moving from lying on your back to sitting on the side of a flat bed without using bedrails?: A Little Help needed moving to and from a bed to a chair (including a wheelchair)?: A Little Help needed  standing up from a chair using your arms (e.g., wheelchair or bedside chair)?: A Little Help needed to walk in hospital room?: A Little Help needed climbing 3-5 steps with a railing? : A Little 6 Click Score: 18    End of Session Equipment Utilized During Treatment: Gait belt Activity Tolerance: Patient tolerated treatment well Patient left: in chair;with call bell/phone within reach;with chair alarm set Nurse Communication: Mobility status PT Visit Diagnosis: Difficulty in walking, not elsewhere classified (R26.2);Other abnormalities of gait and mobility (R26.89);History of falling (Z91.81);Muscle weakness (generalized) (M62.81)    Time: 8413-2440 PT Time Calculation (min) (ACUTE ONLY): 39 min   Charges:   PT Evaluation $PT Eval Moderate Complexity: 1 Mod PT Treatments $Gait Training: 8-22 mins        Brilee Port P, PT Acute Rehabilitation Services Pager: [phone] Office: [place] 07/26/2019, 1:42 PM

## 2019-07-27 ENCOUNTER — Inpatient Hospital Stay (HOSPITAL_COMMUNITY): Payer: Medicare Other

## 2019-07-27 DIAGNOSIS — N186 End stage renal disease: Secondary | ICD-10-CM | POA: Diagnosis not present

## 2019-07-27 DIAGNOSIS — Z992 Dependence on renal dialysis: Secondary | ICD-10-CM | POA: Diagnosis not present

## 2019-07-27 LAB — CBC
HCT: 29.9 % — ABNORMAL LOW (ref 39.0–52.0)
Hemoglobin: 9.3 g/dL — ABNORMAL LOW (ref 13.0–17.0)
MCH: 30.5 pg (ref 26.0–34.0)
MCHC: 31.1 g/dL (ref 30.0–36.0)
MCV: 98 fL (ref 80.0–100.0)
Platelets: 170 10*3/uL (ref 150–400)
RBC: 3.05 MIL/uL — ABNORMAL LOW (ref 4.22–5.81)
RDW: 14.6 % (ref 11.5–15.5)
WBC: 13.4 10*3/uL — ABNORMAL HIGH (ref 4.0–10.5)
nRBC: 0 % (ref 0.0–0.2)

## 2019-07-27 LAB — COMPREHENSIVE METABOLIC PANEL
ALT: 12 U/L (ref 0–44)
AST: 27 U/L (ref 15–41)
Albumin: 3.5 g/dL (ref 3.5–5.0)
Alkaline Phosphatase: 64 U/L (ref 38–126)
Anion gap: 8 (ref 5–15)
BUN: 35 mg/dL — ABNORMAL HIGH (ref 8–23)
CO2: 28 mmol/L (ref 22–32)
Calcium: 9.6 mg/dL (ref 8.9–10.3)
Chloride: 102 mmol/L (ref 98–111)
Creatinine, Ser: 3.96 mg/dL — ABNORMAL HIGH (ref 0.61–1.24)
GFR calc Af Amer: 15 mL/min — ABNORMAL LOW (ref >60)
GFR calc non Af Amer: 13 mL/min — ABNORMAL LOW (ref >60)
Glucose, Bld: 143 mg/dL — ABNORMAL HIGH (ref 70–99)
Potassium: 3.9 mmol/L (ref 3.5–5.1)
Sodium: 138 mmol/L (ref 135–145)
Total Bilirubin: 1.3 mg/dL — ABNORMAL HIGH (ref 0.3–1.2)
Total Protein: 7.1 g/dL (ref 6.5–8.1)

## 2019-07-27 MED ORDER — LORAZEPAM 2 MG/ML IJ SOLN
1.0000 mg | Freq: Once | INTRAMUSCULAR | Status: AC
  Administered 2019-07-27: 1 mg via INTRAVENOUS
  Filled 2019-07-27: qty 1

## 2019-07-27 NOTE — TOC Initial Note (Signed)
Transition of Care [facility]) - Initial/Assessment Note    Patient Details  Name: Jeffrey Campbell MRN: [id number] Date of Birth: [birth year]  Transition of Care [facility]) CM/SW Contact:    Pollie Friar, RN Phone Number: 07/27/2019, 3:26 PM  Clinical Narrative:                 CM met with the patient and then spoke to his wife over the phone. Pt with orders for [facility] service and wife states they have used [facility] in the past and they would like to use them again. Butch Penny with [facility] accepted the referral.  Pt states his wife and daughter provide him his medications. Family provides needed transportation. Pt with orders for a rolling walker. Per Zack with [employer] pt received a walker Nov of 2020 and thus cant get another walker under his insurance. CM has updated his daughter and she will obtain the patient a walker outside the hospital setting.  Pt has transportation home when medically ready.  Expected Discharge Plan: [place] Barriers to Discharge: Continued Medical Work up   Patient Goals and CMS Choice   CMS Medicare.gov Compare Post Acute Care list provided to:: Patient Represenative (must comment) Choice offered to / list presented to : Spouse  Expected Discharge Plan and Services Expected Discharge Plan: [place]   Discharge Planning Services: CM Consult Post Acute Care Choice: Home Health, Durable Medical Equipment Living arrangements for the past 2 months: Single Family Home                 DME Arranged: Walker rolling DME Agency: [employer] Date DME Agency Contacted: 07/27/19   Representative spoke with at DME Agency: [place] Arranged: PT, OT, RN, Nurse's Aide, Social Work [employer] Agency: [place] Date [place]: 07/27/19   Representative spoke with at [place]: Butch Penny  Prior Living Arrangements/Services Living arrangements for the past 2 months: [place] with:: Spouse Patient language and need  for interpreter reviewed:: Yes Do you feel safe going back to the place where you live?: Yes      Need for Family Participation in Patient Care: Yes (Comment) Care giver support system in place?: Yes (comment)   Criminal Activity/Legal Involvement Pertinent to Current Situation/Hospitalization: No - Comment as needed  Activities of Daily Living   ADL Screening (condition at time of admission) Patient's cognitive ability adequate to safely complete daily activities?: No Is the patient deaf or have difficulty hearing?: No Does the patient have difficulty seeing, even when wearing glasses/contacts?: No Does the patient have difficulty concentrating, remembering, or making decisions?: Yes Patient able to express need for assistance with ADLs?: No Does the patient have difficulty dressing or bathing?: Yes Independently performs ADLs?: No Does the patient have difficulty walking or climbing stairs?: Yes Weakness of Legs: None Weakness of Arms/Hands: None  Permission Sought/Granted                  Emotional Assessment Appearance:: Appears stated age Attitude/Demeanor/Rapport: Engaged Affect (typically observed): Accepting Orientation: : Oriented to Self, Oriented to Place   Psych Involvement: No (comment)  Admission diagnosis:  Acute metabolic encephalopathy [H65.79] Patient Active Problem List   Diagnosis Date Noted  . Hypertension   . ESRD (end stage renal disease) ([place])   . Elevated troponin   . Complete heart block ([place]) 02/08/2019  . Atrial flutter ([place])   . Dementia without behavioral disturbance ([place]) 09/18/2018  .  Goals of care, counseling/discussion   . Palliative care by specialist   . DNR (do not resuscitate) discussion   . Acute encephalopathy 09/01/2018  . Hypothermia 09/01/2018  . GI bleeding 10/15/2016  . GI bleed 10/15/2016  . Anemia of chronic disease   . CKD (chronic kidney disease), stage V ([place])   . Hypertensive urgency   . Vascular dementia with  behavioral disturbance ([place]) 09/24/2016  . Acute metabolic encephalopathy   . Branch retinal vein occlusion of right eye 05/07/2016  . Graves' orbitopathy 05/07/2016  . Primary open angle glaucoma of left eye, mild stage 05/07/2016  . Primary open angle glaucoma of right eye, severe stage 05/07/2016  . PAD (peripheral artery disease) ([place]) 04/20/2016  . Bilateral renal artery stenosis ([place]) 02/18/2015  . Pulmonary hypertension ([place]) 02/10/2015  . Chronic diastolic CHF (congestive heart failure) ([place]) 02/10/2015  . Renal bruit 01/07/2015  . Carotid artery stenosis   . Diastolic dysfunction   . Bradycardia 05/29/2013  . Glaucoma 04/09/2011  . COPD (chronic obstructive pulmonary disease) ([place]) 09/21/2010  . OSA (obstructive sleep apnea)   . Upper airway resistance syndrome   . Hypothyroidism   . Type 2 diabetes, diet controlled ([place])   . CAD (coronary artery disease)   . Hyperlipemia   . Hypertensive heart and chronic kidney disease with heart failure and stage 1 through stage 4 chronic kidney disease, or chronic kidney disease ([place])   . PUD (peptic ulcer disease)    PCP:  [name], MD Pharmacy:   [id number] [place] [address]  [id number]: [phone]: [phone] [facility] [place] [facility] [id number]: [phone]: [phone]    Social Determinants of Health (SDOH) Interventions    Readmission Risk Interventions No flowsheet data found.

## 2019-07-27 NOTE — Progress Notes (Signed)
  Speech Language Pathology Treatment: Dysphagia  Patient Details Name: Jeffrey Campbell MRN: [id number] DOB: [birth year] Today's Date: 07/27/2019 Time: 7829-5621 SLP Time Calculation (min) (ACUTE ONLY): 8 min  Assessment / Plan / Recommendation Clinical Impression  Pt's affect brighter today and more conversant. Goal of therapy was to ensure efficiency with regular texture food and no s/s aspiration with history of dementia. He did not require cues for self administration of Dominski cracker and thin via straw and exhibited brisk mastication, transit and swallow from subjective measure. No indications of decreased airway protection. He should continue regular/thin liquids, pills whole in puree. No further ST needed presently.    HPI HPI: Jeffrey Campbell is a 86 y.o. male admitted with AMS with behavioral changes. PMH: dementia, upper airway resistance syndrome, OSA, HTN,  ETOG abuse, multiple thyroid nodules. Per chart family reports pt has been spitting out his meds. Unable to be dialyzed Tues due to difficulty with vein access. BSE 2018 revealing subjectively normal swallow function and reg/thin recommended.      SLP Plan  All goals met;Discharge SLP treatment due to (comment)       Recommendations  Diet recommendations: Regular;Thin liquid Liquids provided via: Straw;Cup Medication Administration: Whole meds with puree Supervision: Patient able to self feed;Intermittent supervision to cue for compensatory strategies Compensations: Slow rate;Small sips/bites                Oral Care Recommendations: Oral care BID Follow up Recommendations: None SLP Visit Diagnosis: Dysphagia, unspecified (R13.10) Plan: All goals met;Discharge SLP treatment due to (comment)       GO                Houston Siren 07/27/2019, 4:01 PM

## 2019-07-27 NOTE — Progress Notes (Signed)
Ativan 1 mg given to patient at 1835 due to agitation, restlessness, and anxiety. Will continue to monitor.

## 2019-07-27 NOTE — Progress Notes (Signed)
Bedside swallow assessment     07/26/19 1051  SLP Visit Information  SLP Received On 07/26/19  General Information  HPI Jeffrey Campbell is a 86 y.o. male admitted with AMS with behavioral changes. PMH: dementia, upper airway resistance syndrome, OSA, HTN,  ETOG abuse, multiple thyroid nodules. Per chart family reports pt has been spitting out his meds. Unable to be dialyzed Tues due to difficulty with vein access. BSE 2018 revealing subjectively normal swallow function and reg/thin recommended.  Type of Study Bedside Swallow Evaluation  Previous Swallow Assessment  (see BSE)  Diet Prior to this Study NPO  Temperature Spikes Noted No  Respiratory Status Room air  History of Recent Intubation No  Behavior/Cognition Alert;Pleasant mood;Cooperative;Requires cueing  Oral Cavity Assessment WFL  Oral Care Completed by SLP No  Oral Cavity - Dentition Adequate natural dentition (missing a few)  Vision Functional for self-feeding  Self-Feeding Abilities Able to feed self  Patient Positioning Upright in chair  Baseline Vocal Quality Normal  Volitional Cough Weak  Oral Motor/Sensory Function  Overall Oral Motor/Sensory Function WFL  Ice Chips  Ice chips NT  Thin Liquid  Thin Liquid WFL  Presentation Cup;Straw  Nectar Thick Liquid  Nectar Thick Liquid NT  Honey Thick Liquid  Honey Thick Liquid NT  Puree  Puree WFL  Solid  Solid WFL  SLP - End of Session  Patient left in bed;with call bell/phone within reach  SLP Assessment  Clinical Impression Statement (ACUTE ONLY) Pt seen for bedside swallow assessment and appropriateness for initiation of texture/thin. Respirations stable, vocal quality clear and no indications of aspiration with multiple straw sips thin, puree or solid. Oral mechanisms and control was within functional limits. ST will order regular, thin liquids, pills whole in applesauce. Ensure pt is in upright position following safe swallow precautions. WIll follow up with pt for  safety with continued recommendations.     SLP Visit Diagnosis Dysphagia, unspecified (R13.10)  Impact on safety and function Mild aspiration risk  Other Related Risk Factors Cognitive impairment  Swallow Evaluation Recommendations  SLP Diet Recommendations Regular;Thin liquid  Liquid Administration via Cup;Straw  Medication Administration Whole meds with puree  Supervision Patient able to self feed;Intermittent supervision to cue for compensatory strategies  Compensations Slow rate;Small sips/bites  Postural Changes Seated upright at 90 degrees  Treatment Plan  Oral Care Recommendations Oral care BID  Treatment Recommendations Therapy as outlined in treatment plan below  Follow up Recommendations None  Speech Therapy Frequency (ACUTE ONLY) min 1 x/week  Treatment Duration 1 week  Interventions Diet toleration management by SLP  Prognosis  Prognosis for Safe Diet Advancement Fair  Individuals Consulted  Consulted and Agree with Results and Recommendations Patient unable/family or caregiver not available;RN  SLP Time Calculation  SLP Start Time (ACUTE ONLY) 1052  SLP Stop Time (ACUTE ONLY) 1101  SLP Time Calculation (min) (ACUTE ONLY) 9 min  SLP Evaluations  $ SLP Speech Visit 1 Visit  SLP Evaluations  $BSS Swallow 1 Procedure  Jeffrey Campbell Jeffrey Campbell M.Ed Risk analyst [phone] Office [phone]

## 2019-07-27 NOTE — Progress Notes (Signed)
Pt transferred to 3W rm 32 .stable in bed.

## 2019-07-27 NOTE — Progress Notes (Signed)
Patient transferred from [facility] to this unit at 0210am in a bed, alert and oriented x2, continues on telemetry monitor, resting in a low bed, and will continue to monitor.

## 2019-07-27 NOTE — Discharge Instructions (Signed)

## 2019-07-27 NOTE — Consult Note (Addendum)
Hospital Consult    Reason for Consult:  Evaluation of L arm AVF Requesting Physician:  Dr. Posey Pronto MRN #:  [id number]  History of Present Illness: This is a 86 y.o. male with end-stage renal disease on hemodialysis being seen in consultation for evaluation of left arm AV fistula.  Left brachiocephalic fistula was created by Dr. Donzetta Matters in July 2019.  Due to underlying dementia and inability to keep left arm still during hemodialysis patient has experienced infiltration and extravasation episodes.  Fistula was last infiltrated on Tuesday 07/24/19.  He has a right-sided pacemaker.  He is on Eliquis for atrial flutter.  Past Medical History:  Diagnosis Date  . Anemia    low iron  . BPH (benign prostatic hyperplasia)   . CAD (coronary artery disease)   . Carotid artery stenosis    1-39% bilateral carotid artery stenosis and < 50% stenosis in the right CCA by dopplers 06/2017  . CKD (chronic kidney disease), stage III    Stage 4  . Diastolic dysfunction   . Glaucoma   . Graves disease   . Heart murmur, systolic   . History of ETOH abuse   . History of PFTs 05/2010   moderate airflow obstruction w reduced DLCO by PFT   . Hyperlipemia   . Hypertension   . Hyperthyroidism 08/26/10   radioactive iodine therapy   . Memory loss   . Mitral regurgitation echo 2015   mild  . Multiple thyroid nodules   . MVP (mitral valve prolapse) 11/2012   posterior MVP  . Organic impotence   . OSA (obstructive sleep apnea)    upper airway resistance syndrome with RDI 18/hr - not on CPAP due to insurance not covering  . Pre-diabetes   . PUD (peptic ulcer disease)   . Pulmonary hypertension ([place]) echo 2015   Group 2 with pulmonary venous HTN and Group 3 with OSA  . Shutdown renal    after cardiac cath  . Upper airway resistance syndrome     Past Surgical History:  Procedure Laterality Date  . APPENDECTOMY    . AV FISTULA PLACEMENT Left 10/18/2017   Procedure: ARTERIOVENOUS (AV) FISTULA CREATION ARM;   Surgeon: Waynetta Sandy, MD;  Location: [place];  Service: Vascular;  Laterality: Left;  . CARDIAC CATHETERIZATION    . CARDIAC CATHETERIZATION N/A 02/17/2015   Procedure: Right Heart Cath;  Surgeon: Larey Dresser, MD;  Location: [place] CV LAB;  Service: Cardiovascular;  Laterality: N/A;  . CARDIOVERSION N/A 02/08/2019   Procedure: CARDIOVERSION;  Surgeon: Donato Heinz, MD;  Location: [facility] ENDOSCOPY;  Service: Endoscopy;  Laterality: N/A;  . ESOPHAGOGASTRODUODENOSCOPY (EGD) WITH PROPOFOL Left 10/16/2016   Procedure: ESOPHAGOGASTRODUODENOSCOPY (EGD) WITH PROPOFOL;  Surgeon: Ronnette Juniper, MD;  Location: [place];  Service: Gastroenterology;  Laterality: Left;  . heart catherization    . HERNIA REPAIR  10/2009  . IR RADIOLOGIST EVAL & MGMT  09/28/2016  . IR RADIOLOGIST EVAL & MGMT  10/19/2016  . IR RADIOLOGIST EVAL & MGMT  01/18/2017  . PACEMAKER IMPLANT N/A 02/09/2019   Procedure: PACEMAKER IMPLANT;  Surgeon: Constance Haw, MD;  Location: [place] CV LAB;  Service: Cardiovascular;  Laterality: N/A;  . RIGHT HEART CATH N/A 07/28/2016   Procedure: Right Heart Cath;  Surgeon: Larey Dresser, MD;  Location: [place] CV LAB;  Service: Cardiovascular;  Laterality: N/A;    Allergies  Allergen Reactions  . Iodinated Diagnostic Agents Other (See Comments)    Right heart  cath 07/2016 allegedly "shut down his kidneys" Patient has stage III chronic kidney disease  . Zocor [Simvastatin] Other (See Comments)    Liver problems   . Budesonide-Formoterol Fumarate Other (See Comments)    Dry mouth  . Minoxidil Other (See Comments)    Unknown raction  . Tiotropium Bromide Monohydrate Other (See Comments)    Dry mouth  . Tricor [Fenofibrate]     Unknown reaction    Prior to Admission medications   Medication Sig Start Date End Date Taking? Authorizing Provider  amLODipine (NORVASC) 10 MG tablet Take 1 tablet (10 mg total) by mouth daily. 02/12/19  Yes Lyda Jester M, PA-C  apixaban (ELIQUIS) 2.5 MG TABS tablet Take 1 tablet (2.5 mg total) by mouth 2 (two) times daily. 06/19/19  Yes Larey Dresser, MD  atorvastatin (LIPITOR) 20 MG tablet Take 1 tablet (20 mg total) by mouth daily. 06/29/19  Yes Larey Dresser, MD  Brinzolamide-Brimonidine [facility]) 1-0.2 % SUSP Place 1 drop into both eyes 2 (two) times daily.    Yes [provider]  donepezil (ARICEPT) 10 MG tablet Take 1 tablet (10 mg total) by mouth at bedtime. 07/19/19  Yes Suzzanne Cloud, NP  doxazosin (CARDURA) 4 MG tablet Take 1 tablet (4 mg total) by mouth daily. Patient taking differently: Take 4 mg by mouth 2 (two) times daily.  02/12/19  Yes Lyda Jester M, PA-C  hydrALAZINE (APRESOLINE) 100 MG tablet Take 1 tablet (100 mg total) by mouth 3 (three) times daily. Please make yearly appt with Dr. Radford Pax for October. 1st attempt 02/12/19  Yes Lyda Jester M, PA-C  levothyroxine (SYNTHROID, LEVOTHROID) 150 MCG tablet Take 150 mcg by mouth daily before breakfast.   Yes [provider]  memantine (NAMENDA) 10 MG tablet Take 1 tablet (10 mg total) by mouth 2 (two) times daily. 07/19/19  Yes Suzzanne Cloud, NP  triamcinolone ointment (KENALOG) 0.5 % Apply 1 application topically 2 (two) times daily as needed (rash).   Yes [provider]  cloNIDine (CATAPRES) 0.2 MG tablet Take 1.5 tablets (0.3 mg total) by mouth 2 (two) times daily. Patient not taking: Reported on 07/25/2019 05/04/19   Larey Dresser, MD  Epoetin Alfa (PROCRIT IJ) Inject 5,000 Units as directed every 14 (fourteen) days.     [provider]  torsemide (DEMADEX) 20 MG tablet TAKE 4 TABLETS (80 MG TOTAL) BY MOUTH DAILY. Patient not taking: No sig reported 04/03/19   Consuelo Pandy, PA-C    Social History   Socioeconomic History  . Marital status: Married    Spouse name: Not on file  . Number of children: 4  . Years of education: completed high school  . Highest education  level: Not on file  Occupational History  . 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 Smoking status: Former Smoker    Packs/day: 2.00    Years: 40.00    Pack years: 80.00    Types: Cigarettes    Quit date: 03/29/1984    Years since quitting: 35.3  . Smokeless tobacco: Never Used  Substance and Sexual Activity  . Alcohol use: No    Comment: quit in 1981  . Drug use: No  . Sexual activity: Not on file  Other Topics Concern  . Not on file  Social History Narrative   Lives at home with his wife.   Right-handed.   Occasional use of caffeine.  Social Determinants of Health   Financial Resource Strain:   . Difficulty of Paying Living Expenses:   Food Insecurity:   . Worried About Charity fundraiser in the Last Year:   . Arboriculturist in the Last Year:   Transportation Needs:   . Film/video editor (Medical):   Marland Kitchen Lack of Transportation (Non-Medical):   Physical Activity:   . Days of Exercise per Week:   . Minutes of Exercise per Session:   Stress:   . Feeling of Stress :   Social Connections:   . Frequency of Communication with Friends and Family:   . Frequency of Social Gatherings with Friends and Family:   . Attends Religious Services:   . Active Member of Clubs or Organizations:   . Attends Archivist Meetings:   Marland Kitchen Marital Status:   Intimate Partner Violence:   . Fear of Current or Ex-Partner:   . Emotionally Abused:   Marland Kitchen Physically Abused:   . Sexually Abused:      Family History  Problem Relation Age of Onset  . Kidney failure Father   . Hypertension Father   . Colon cancer Mother   . Colon cancer Brother   . Lung disease Brother   . Colon cancer Maternal Uncle     ROS: Otherwise negative unless mentioned in HPI  Physical Examination  Vitals:   07/26/19 2331 07/27/19 0225  BP:  (!) 177/62  Pulse:  61  Resp:  18  Temp: 98.4 F (36.9 C) 98.1 F (36.7 C)  SpO2:  99%   Body mass index  is 17.94 kg/m.  General:  WDWN in NAD Gait: Not observed HENT: WNL, normocephalic Pulmonary: normal non-labored breathing, without Rales, rhonchi,  wheezing Cardiac: regular Abdomen:  soft, NT/ND, no masses Skin: without rashes Vascular Exam/Pulses: palpable L radial pulse Extremities: Large caliber left brachiocephalic fistula with easily palpable thrill throughout length of fistula and upper arm; some fullness around mid to upper arm; ecchymosis without hematoma in left antecubital space Musculoskeletal: no muscle wasting or atrophy  Neurologic: A&O X 3;  No focal weakness or paresthesias are detected; speech is fluent/normal Psychiatric:  The pt has Normal affect. Lymph:  Unremarkable  CBC    Component Value Date/Time   WBC 13.4 (H) 07/27/2019 0654   RBC 3.05 (L) 07/27/2019 0654   HGB 9.3 (L) 07/27/2019 0654   HGB 10.4 (L) 07/21/2016 1037   HCT 29.9 (L) 07/27/2019 0654   HCT 27.7 (L) 07/25/2019 1604   PLT 170 07/27/2019 0654   PLT 178 07/21/2016 1037   MCV 98.0 07/27/2019 0654   MCV 90 07/21/2016 1037   MCH 30.5 07/27/2019 0654   MCHC 31.1 07/27/2019 0654   RDW 14.6 07/27/2019 0654   RDW 14.7 07/21/2016 1037   LYMPHSABS 0.6 (L) 07/25/2019 1117   MONOABS 0.4 07/25/2019 1117   EOSABS 0.0 07/25/2019 1117   BASOSABS 0.0 07/25/2019 1117    BMET    Component Value Date/Time   NA 138 07/27/2019 0654   NA 144 01/10/2019 0900   K 3.9 07/27/2019 0654   CL 102 07/27/2019 0654   CO2 28 07/27/2019 0654   GLUCOSE 143 (H) 07/27/2019 0654   BUN 35 (H) 07/27/2019 0654   BUN 84 (HH) 01/10/2019 0900   CREATININE 3.96 (H) 07/27/2019 0654   CREATININE 3.10 (H) 01/13/2017 0920   CALCIUM 9.6 07/27/2019 0654   GFRNONAA 13 (L) 07/27/2019 0654   GFRNONAA 18 (L) 01/13/2017 0920  GFRAA 15 (L) 07/27/2019 0654   GFRAA 21 (L) 01/13/2017 0920    COAGS: Lab Results  Component Value Date   INR 1.2 02/05/2019   INR 1.00 10/15/2016   INR 1.1 07/21/2016     ASSESSMENT/PLAN: This  is a 86 y.o. male   -Patent left brachiocephalic fistula with palpable thrill throughout upper arm; palpable left radial pulse without signs or symptoms of steal syndrome -Some fullness in mid arm around fistula which is likely the site of most recent infiltration however thrill is still easy to palpate in this area -Fistula is without much tortuosity and there are no palpable large side branches; no surgical intervention indicated for fistula at this time -If there is concern for ongoing infiltration/extravasation episodes due to inability to keep left arm still, vascular is available for [facility] placement however will defer this decision to nephrology -If decision is made to make patient catheter dependent, Eliquis will need to be held for 48 hours in preparation for [facility] placement -On-call vascular surgeon Dr. Carlis Abbott will evaluate the patient later today and provide further treatment plans   Dagoberto Ligas PA-C [facility] [phone]  I have seen and evaluated the patient. I agree with the PA note as documented above.  86 year old male with ESRD that vascular surgery has been asked to evaluate left arm fistula given difficulty with access.  Fistula was placed in 2019 by Dr. Donzetta Matters and is a left brachiocephalic.  He has an excellent thrill in the fistula.  The upper arm does have fullness suggestive of infiltration versus aneurysm.  I think even if there is some infiltration it appears that there is healthy segment of vein in the lower arm that would be easy to access.  There is some documentation of him moving his arm due to his dementia etc. and discussion about making him catheter dependent.  I would leave that to nephrology to decide if they want [place] to put a catheter they can let us know.  We will get a fistula duplex just to rule out any other technical issues.  Marty Heck, MD [facility] [place] Office: [phone]

## 2019-07-27 NOTE — Progress Notes (Signed)
PROGRESS NOTE  Jeffrey Campbell LDJ:[id number] DOB: 1932-05-01 DOA: 07/25/2019 PCP: Jeffrey Ruff, MD  HPI/Recap of past 24 hours: Jeffrey Campbell is a 86 y.o. male with medical history significant of complete heart block status post pacemaker, atrial flutter-on Eliquis, dementia, obstructive sleep apnea-not on CPAP, chronic diastolic CHF, pulmonary hypertension, coronary artery disease, ESRD on dialysis (TTS), anemia of chronic disease, hypothyroidism, hyperlipidemia, PVD, presents to ED with altered mental status.  History is provided by patient's daughter over the phone.  Patient has been more confused for the last 2 days.  He has not slept in last 2 days.  His confusion is getting worse possibly hallucinating.  He tried to use the bathroom in the living room last night.  Not sure if he took his medicines last night or this morning.  He fell out of his bed yesterday morning.  At 6 this morning he tried to leave the house half dressed therefore daughter called EMS and brought patient to ER for further evaluation and management.  He recently started hemodialysis.  He was dialyzed successfully on Friday and Monday however they attempted to dialyze him on Tuesday but could not dialyze due to difficulty with vein access.  His next scheduled session is on Thursday 07/26/19.  Patient lives with his wife at home.  No history of smoking, alcohol, illicit drug use.  ED Course: Upon arrival to ED: Patient afebrile with no leukocytosis.  BP: 199/60, UA negative for infection.  CBC shows normocytic anemia.  COVID-19 negative, ethanol: WNL.  Troponin trended up from 92-1 10.  C CT head is negative for acute findings.  Chest x-ray shows: Cardiomegaly with central vascular engorgement with possible signs of interstitial edema.  [facility] hospitalist consulted for admission for acute metabolic encephalopathy.  07/27/19: Seen and examined.  Alert and confused in a state of dementia.  He has no new  complaints.    Assessment/Plan: Principal Problem:   Acute metabolic encephalopathy Active Problems:   OSA (obstructive sleep apnea)   Hypothyroidism   Anemia of chronic disease   Hypertension   ESRD (end stage renal disease) (HCC)   Elevated troponin   Acute metabolic encephalopathy in the setting of dementia CT head nonacute UDS in process TSH normal Ammonia level normal No evidence of active infective process  Elevated troponin Peaked at 132 12 lead EKG showing ST T depression Repeat 12 leads EKG HD 4/29 Next HD planned tomorrow He denies any anginal symptoms  QTC prolongation QTC >500 Repeat EKG and avoid qtc prolonging agents  Severe pulmonary hypertension RSVP 72 as seen on 2 D echo done on 01/05/19 Will need cardiology follow up  Hypothyroidism TSH normal Continue levothyroxine  ESRD on HD TTS HD on 4/29 HD planned tomorrow  Elevated T bili Unclear etiology T bili 1.4>> 1.3  CdCHF Fluid addressed by HD Continue cardiac meds  PVD/HLD Continue statin  Dementia C/w home meds  Essential HTN BP stable for HD C/w home meds  OSA Does not use CPAP at home  Physical debility PT recommended home health PT with 24-hour supervision assistance Continue PT OT with assistance and fall precautions   DVT prophylaxis: Eliquis/TED/SCD  Code Status: Full code-confirmed with the patient daughter  Family Communication: None at bedside.  Disposition Plan: From home.  Anticipate dc to Home vs SNF pending PT OT eval and hemodynamic stability.    Consults called: Nephrology by EDP     Objective: Vitals:   07/27/19 0500 07/27/19 1237 07/27/19 1550 07/27/19  1550  BP:  (!) 183/163 (!) 152/62 (!) 152/62  Pulse:  72  69  Resp:  18  18  Temp:  98.6 F (37 C)  98.7 F (37.1 C)  TempSrc:  Oral  Oral  SpO2:  99%  99%  Weight: 53.5 kg     Height:        Intake/Output Summary (Last 24 hours) at 07/27/2019 1626 Last data filed at 07/27/2019  0600 Gross per 24 hour  Intake 30 ml  Output 86 ml  Net -56 ml   Filed Weights   07/25/19 2205 07/26/19 0428 07/27/19 0500  Weight: 49.9 kg 50.8 kg 53.5 kg    Exam:  . General: 86 y.o. year-old male frail-appearing no acute distress.  Alert and interactive in the setting of dementia . Cardiovascular: Regular rate and rhythm with no rubs or gallops.  Loud systolic murmur.   Marland Kitchen Respiratory: Clear to auscultation no wheezes or rales.   . Abdomen: Soft nontender normal bowel sounds present.   . Musculoskeletal: No lower extremity edema bilaterally. Marland Kitchen Psychiatry: Mood is appropriate for condition and setting   Data Reviewed: CBC: Recent Labs  Lab 07/25/19 1117 07/25/19 1125 07/25/19 1604 07/26/19 0254 07/27/19 0654  WBC 9.2  --   --  9.9 13.4*  NEUTROABS 8.1*  --   --   --   --   HGB 9.1* 9.5*  --  9.2* 9.3*  HCT 29.7* 28.0* 27.7* 29.9* 29.9*  MCV 97.7  --   --  99.0 98.0  PLT 164  --   --  173 629   Basic Metabolic Panel: Recent Labs  Lab 07/25/19 1117 07/25/19 1125 07/25/19 2321 07/26/19 0254 07/27/19 0654  NA 143 144  --  144 138  K 4.1 4.0  --  3.7 3.9  CL 103 102  --  106 102  CO2 25  --   --  26 28  GLUCOSE 124* 120*  --  128* 143*  BUN 52* 48*  --  58* 35*  CREATININE 5.54* 5.70*  --  5.64* 3.96*  CALCIUM 11.1*  --   --  10.5* 9.6  MG  --   --  2.4  --   --   PHOS  --   --  5.2*  --   --    GFR: Estimated Creatinine Clearance: 10.5 mL/min (A) (by C-G formula based on SCr of 3.96 mg/dL (H)). Liver Function Tests: Recent Labs  Lab 07/25/19 1117 07/26/19 0254 07/27/19 0654  AST 32 33 27  ALT 13 14 12   ALKPHOS 61 60 64  BILITOT 1.1 1.4* 1.3*  PROT 8.1 7.2 7.1  ALBUMIN 4.2 3.8 3.5   No results for input(s): LIPASE, AMYLASE in the last 168 hours. Recent Labs  Lab 07/25/19 1602  AMMONIA 24   Coagulation Profile: No results for input(s): INR, PROTIME in the last 168 hours. Cardiac Enzymes: No results for input(s): CKTOTAL, CKMB, CKMBINDEX,  TROPONINI in the last 168 hours. BNP (last 3 results) Recent Labs    01/09/19 1019  PROBNP 21,225*   HbA1C: No results for input(s): HGBA1C in the last 72 hours. CBG: Recent Labs  Lab 07/25/19 1039 07/26/19 0137  GLUCAP 130* 114*   Lipid Profile: No results for input(s): CHOL, HDL, LDLCALC, TRIG, CHOLHDL, LDLDIRECT in the last 72 hours. Thyroid Function Tests: Recent Labs    07/25/19 1602  TSH 1.549   Anemia Panel: Recent Labs    07/25/19 [place]  Urine analysis:    Component Value Date/Time   COLORURINE YELLOW 07/25/2019 1116   APPEARANCEUR HAZY (A) 07/25/2019 1116   LABSPEC 1.019 07/25/2019 1116   PHURINE 5.0 07/25/2019 1116   GLUCOSEU NEGATIVE 07/25/2019 1116   [place] 07/25/2019 1116   [place] 07/25/2019 1116   KETONESUR NEGATIVE 07/25/2019 1116   PROTEINUR 100 (A) 07/25/2019 1116   NITRITE NEGATIVE 07/25/2019 1116   LEUKOCYTESUR NEGATIVE 07/25/2019 1116   Sepsis Labs: @LABRCNTIP (procalcitonin:4,lacticidven:4)  ) Recent Results (from the past 240 hour(s))  Respiratory Panel by RT PCR (Flu A&B, Covid) - Nasopharyngeal Swab     Status: None   Collection Time: 07/25/19 12:36 PM   Specimen: Nasopharyngeal Swab  Result Value Ref Range Status   SARS Coronavirus 2 by RT PCR NEGATIVE NEGATIVE Final    Comment: (NOTE) SARS-CoV-2 target nucleic acids are NOT DETECTED. The SARS-CoV-2 RNA is generally detectable in upper respiratoy specimens during the acute phase of infection. The lowest concentration of SARS-CoV-2 viral copies this assay can detect is 131 copies/mL. A negative result does not preclude SARS-Cov-2 infection and should not be used as the sole basis for treatment or other patient management decisions. A negative result may occur with  improper specimen collection/handling, submission of specimen other than nasopharyngeal swab, presence of viral mutation(s) within the areas targeted by this assay, and inadequate  number of viral copies (<131 copies/mL). A negative result must be combined with clinical observations, patient history, and epidemiological information. The expected result is Negative. Fact Sheet for Patients:  PinkCheek.be Fact Sheet for Healthcare Providers:  GravelBags.it This test is not yet ap proved or cleared by the [place] FDA and  has been authorized for detection and/or diagnosis of SARS-CoV-2 by FDA under an Emergency Use Authorization (EUA). This EUA will remain  in effect (meaning this test can be used) for the duration of the COVID-19 declaration under Section 564(b)(1) of the Act, 21 U.S.C. section 360bbb-3(b)(1), unless the authorization is terminated or revoked sooner.    Influenza A by PCR NEGATIVE NEGATIVE Final   Influenza B by PCR NEGATIVE NEGATIVE Final    Comment: (NOTE) The Xpert Xpress SARS-CoV-2/FLU/RSV assay is intended as an aid in  the diagnosis of influenza from Nasopharyngeal swab specimens and  should not be used as a sole basis for treatment. Nasal washings and  aspirates are unacceptable for Xpert Xpress SARS-CoV-2/FLU/RSV  testing. Fact Sheet for Patients: PinkCheek.be Fact Sheet for Healthcare Providers: GravelBags.it This test is not yet approved or cleared by the [place] FDA and  has been authorized for detection and/or diagnosis of SARS-CoV-2 by  FDA under an Emergency Use Authorization (EUA). This EUA will remain  in effect (meaning this test can be used) for the duration of the  Covid-19 declaration under Section 564(b)(1) of the Act, 21  U.S.C. section 360bbb-3(b)(1), unless the authorization is  terminated or revoked. Performed at [facility], [place] [address] [place], [place] [address]   MRSA PCR Screening     Status: None   Collection Time: 07/25/19 10:06 PM   Specimen: Nasal Mucosa; Nasopharyngeal   Result Value Ref Range Status   MRSA by PCR NEGATIVE NEGATIVE Final    Comment:        The GeneXpert MRSA Assay (FDA approved for NASAL specimens only), is one component of a comprehensive MRSA colonization surveillance program. It is not intended to diagnose MRSA infection nor to guide or monitor treatment for MRSA infections. Performed at [facility], [place] [address] [place],  [address]       Studies: No results found.  Scheduled Meds: . amLODipine  10 mg Oral Daily  . apixaban  2.5 mg Oral BID  . atorvastatin  20 mg Oral Daily  . Chlorhexidine Gluconate Cloth  6 each Topical Q0600  . donepezil  10 mg Oral QHS  . doxazosin  4 mg Oral Daily  . hydrALAZINE  100 mg Oral TID  . levothyroxine  150 mcg Oral QAC breakfast  . memantine  10 mg Oral BID    Continuous Infusions:    LOS: 2 days     Kayleen Memos, MD [facility] Pager [phone]  If 7PM-7AM, please contact night-coverage [email or web address] Password [facility] 07/27/2019, 4:26 PM

## 2019-07-27 NOTE — Progress Notes (Addendum)
Patient ID: Jeffrey Campbell, male   DOB: 09-Jul-1932, 86 y.o.   MRN: [id number]  [facility] Progress Note   Assessment/ Plan:   1.  Altered mental status in patient with underlying dementia: Unfortunately significant dementia at baseline part of which the problems with his delivery of dialysis with inability to keep his left arm still when cannulated at hemodialysis resulting in infiltration/extravasation. 2. ESRD: He is usually on a TTS dialysis schedule and I will order for hemodialysis again tomorrow  to resume outpatient schedule.  There has been intermittent success in cannulating his fistula however, with his dementia/confusion inability to hold his arm still at dialysis; there is risk for needle dislodgment and catastrophic bleed.  Will have him evaluated by vascular surgery to see if collateral veins need to be tied off plus/minus placement of dialysis catheter. 3. Anemia: With marginally low hemoglobin and hematocrit, monitor on ESA. 4. CKD-MBD: Calcium level improved following hemodialysis, will monitor with low calcium dialysate. 5. Nutrition: Continue renal diet with ongoing nutritional supplementation. 6. Hypertension: Blood pressures intermittently elevated, monitor with hemodialysis/UF and ongoing medications.  Subjective:   Reports to be feeling fair, somewhat confused - not oriented to place and person   Objective:   BP (!) 177/62 (BP Location: Right Leg)   Pulse 61   Temp 98.1 F (36.7 C) (Oral)   Resp 18   Ht 5\' 8"  (1.727 m)   Wt 53.5 kg   SpO2 99%   BMI 17.94 kg/m   Physical Exam: Gen: Comfortable resting in bed, awaiting assistance to eat breakfast CVS: Pulse regular rhythm, normal rate, 2/6 ejection systolic murmur Resp: Clear to location bilaterally, no rales/rhonchi Abd: Soft, flat, nontender, bowel sounds normal Ext: No lower extremity edema, left brachiocephalic fistula is pulsatile with some ecchymosis from recent infiltration.  Collateral veins  noted.  Labs: BMET Recent Labs  Lab 07/25/19 1117 07/25/19 1125 07/25/19 2321 07/26/19 0254 07/27/19 0654  NA 143 144  --  144 138  K 4.1 4.0  --  3.7 3.9  CL 103 102  --  106 102  CO2 25  --   --  26 28  GLUCOSE 124* 120*  --  128* 143*  BUN 52* 48*  --  58* 35*  CREATININE 5.54* 5.70*  --  5.64* 3.96*  CALCIUM 11.1*  --   --  10.5* 9.6  PHOS  --   --  5.2*  --   --    CBC Recent Labs  Lab 07/25/19 1117 07/25/19 1117 07/25/19 1125 07/25/19 1604 07/26/19 0254 07/27/19 0654  WBC 9.2  --   --   --  9.9 13.4*  NEUTROABS 8.1*  --   --   --   --   --   HGB 9.1*  --  9.5*  --  9.2* 9.3*  HCT 29.7*   < > 28.0* 27.7* 29.9* 29.9*  MCV 97.7  --   --   --  99.0 98.0  PLT 164  --   --   --  173 170   < > = values in this interval not displayed.     Medications:    . amLODipine  10 mg Oral Daily  . apixaban  2.5 mg Oral BID  . atorvastatin  20 mg Oral Daily  . Chlorhexidine Gluconate Cloth  6 each Topical Q0600  . donepezil  10 mg Oral QHS  . doxazosin  4 mg Oral Daily  . hydrALAZINE  100 mg  Oral TID  . levothyroxine  150 mcg Oral QAC breakfast  . memantine  10 mg Oral BID   Elmarie Shiley, MD 07/27/2019, 8:42 AM

## 2019-07-28 ENCOUNTER — Inpatient Hospital Stay (HOSPITAL_COMMUNITY): Payer: Medicare Other

## 2019-07-28 DIAGNOSIS — N186 End stage renal disease: Secondary | ICD-10-CM | POA: Diagnosis not present

## 2019-07-28 DIAGNOSIS — Z515 Encounter for palliative care: Secondary | ICD-10-CM

## 2019-07-28 DIAGNOSIS — Z7189 Other specified counseling: Secondary | ICD-10-CM

## 2019-07-28 DIAGNOSIS — Z992 Dependence on renal dialysis: Secondary | ICD-10-CM | POA: Diagnosis not present

## 2019-07-28 DIAGNOSIS — N269 Renal sclerosis, unspecified: Secondary | ICD-10-CM | POA: Diagnosis not present

## 2019-07-28 LAB — COMPREHENSIVE METABOLIC PANEL
ALT: 13 U/L (ref 0–44)
AST: 31 U/L (ref 15–41)
Albumin: 3.7 g/dL (ref 3.5–5.0)
Alkaline Phosphatase: 70 U/L (ref 38–126)
Anion gap: 11 (ref 5–15)
BUN: 50 mg/dL — ABNORMAL HIGH (ref 8–23)
CO2: 26 mmol/L (ref 22–32)
Calcium: 10.6 mg/dL — ABNORMAL HIGH (ref 8.9–10.3)
Chloride: 103 mmol/L (ref 98–111)
Creatinine, Ser: 4.65 mg/dL — ABNORMAL HIGH (ref 0.61–1.24)
GFR calc Af Amer: 12 mL/min — ABNORMAL LOW (ref >60)
GFR calc non Af Amer: 11 mL/min — ABNORMAL LOW (ref >60)
Glucose, Bld: 116 mg/dL — ABNORMAL HIGH (ref 70–99)
Potassium: 3.7 mmol/L (ref 3.5–5.1)
Sodium: 140 mmol/L (ref 135–145)
Total Bilirubin: 1.4 mg/dL — ABNORMAL HIGH (ref 0.3–1.2)
Total Protein: 7.2 g/dL (ref 6.5–8.1)

## 2019-07-28 LAB — PHOSPHORUS: Phosphorus: 3.6 mg/dL (ref 2.5–4.6)

## 2019-07-28 LAB — CBC WITH DIFFERENTIAL/PLATELET
Abs Immature Granulocytes: 0.08 K/uL — ABNORMAL HIGH (ref 0.00–0.07)
Basophils Absolute: 0 K/uL (ref 0.0–0.1)
Basophils Relative: 0 %
Eosinophils Absolute: 0 K/uL (ref 0.0–0.5)
Eosinophils Relative: 0 %
HCT: 30.3 % — ABNORMAL LOW (ref 39.0–52.0)
Hemoglobin: 9.5 g/dL — ABNORMAL LOW (ref 13.0–17.0)
Immature Granulocytes: 1 %
Lymphocytes Relative: 4 %
Lymphs Abs: 0.5 K/uL — ABNORMAL LOW (ref 0.7–4.0)
MCH: 30.4 pg (ref 26.0–34.0)
MCHC: 31.4 g/dL (ref 30.0–36.0)
MCV: 97.1 fL (ref 80.0–100.0)
Monocytes Absolute: 0.7 K/uL (ref 0.1–1.0)
Monocytes Relative: 6 %
Neutro Abs: 11.5 K/uL — ABNORMAL HIGH (ref 1.7–7.7)
Neutrophils Relative %: 89 %
Platelets: 158 K/uL (ref 150–400)
RBC: 3.12 MIL/uL — ABNORMAL LOW (ref 4.22–5.81)
RDW: 14.6 % (ref 11.5–15.5)
WBC: 12.9 K/uL — ABNORMAL HIGH (ref 4.0–10.5)
nRBC: 0 % (ref 0.0–0.2)

## 2019-07-28 LAB — GLUCOSE, CAPILLARY: Glucose-Capillary: 130 mg/dL — ABNORMAL HIGH (ref 70–99)

## 2019-07-28 MED ORDER — HEPARIN SODIUM (PORCINE) 1000 UNIT/ML DIALYSIS: 40.0000 [IU]/kg | INTRAMUSCULAR | Status: DC | PRN

## 2019-07-28 NOTE — Progress Notes (Signed)
Patient ID: Jeffrey Campbell, male   DOB: [birth year], 86 y.o.   MRN: [id number]  [facility] Progress Note   Assessment/ Plan:   1.  Altered mental status in patient with underlying dementia: He is disoriented but pleasant and noncombative.  Unfortunately his confusion/disorientation poses a barrier to the safe delivery of hemodialysis via his left brachiocephalic fistula and the risk of needle dislodgment.  His candidacy for chronic maintenance hemodialysis is questionable and his mental status is not quite improved even after dialysis; will reevaluate this again tomorrow after dialysis today. 2. ESRD: He is usually on a TTS dialysis schedule and is on schedule for hemodialysis again today.  We will attempt to use his left brachiocephalic fistula and if there is problems, I recommend placement of a tunneled hemodialysis catheter for safe delivery of dialysis ALONG WITH discussions regarding conservative medical management/palliative care for end-stage renal disease. 3. Anemia: With marginally low hemoglobin and hematocrit, monitor on ESA. 4. CKD-MBD: Calcium level improved following hemodialysis, will monitor with low calcium dialysate. 5. Nutrition: Continue renal diet with ongoing nutritional supplementation. 6. Hypertension: Blood pressures intermittently elevated, monitor with hemodialysis/UF and ongoing medications.  Subjective:   Denies any chest pain or shortness of breath.  Informs me that he is trying to get back to [place].  (Feels that he is in [place] or [place] or somewhere there)   Objective:   BP 118/79 (BP Location: Right Arm)   Pulse 70   Temp 98 F (36.7 C) (Oral)   Resp 20   Ht 5\' 8"  (1.727 m)   Wt 53.5 kg   SpO2 93%   BMI 17.94 kg/m   Physical Exam: Gen: Laying across sideways in bed.  Not oriented to time, person or place. CVS: Pulse regular rhythm, normal rate, 2/6 ejection systolic murmur Resp: Clear to location bilaterally, no  rales/rhonchi Abd: Soft, flat, nontender, bowel sounds normal Ext: No lower extremity edema, left brachiocephalic fistula is pulsatile with some ecchymosis from recent infiltration.  With protective  Labs: BMET Recent Labs  Lab 07/25/19 1117 07/25/19 1125 07/25/19 2321 07/26/19 0254 07/27/19 0654 07/28/19 0623  NA 143 144  --  144 138 140  K 4.1 4.0  --  3.7 3.9 3.7  CL 103 102  --  106 102 103  CO2 25  --   --  26 28 26   GLUCOSE 124* 120*  --  128* 143* 116*  BUN 52* 48*  --  58* 35* 50*  CREATININE 5.54* 5.70*  --  5.64* 3.96* 4.65*  CALCIUM 11.1*  --   --  10.5* 9.6 10.6*  PHOS  --   --  5.2*  --   --  3.6   CBC Recent Labs  Lab 07/25/19 1117 07/25/19 1117 07/25/19 1125 07/25/19 1604 07/26/19 0254 07/27/19 0654 07/28/19 0623  WBC 9.2  --   --   --  9.9 13.4* 12.9*  NEUTROABS 8.1*  --   --   --   --   --  11.5*  HGB 9.1*   < > 9.5*  --  9.2* 9.3* 9.5*  HCT 29.7*   < > 28.0* 27.7* 29.9* 29.9* 30.3*  MCV 97.7  --   --   --  99.0 98.0 97.1  PLT 164  --   --   --  173 170 158   < > = values in this interval not displayed.     Medications:    . amLODipine  10 mg  Oral Daily  . apixaban  2.5 mg Oral BID  . atorvastatin  20 mg Oral Daily  . Chlorhexidine Gluconate Cloth  6 each Topical Q0600  . donepezil  10 mg Oral QHS  . doxazosin  4 mg Oral Daily  . hydrALAZINE  100 mg Oral TID  . levothyroxine  150 mcg Oral QAC breakfast  . memantine  10 mg Oral BID   Elmarie Shiley, MD 07/28/2019, 8:57 AM

## 2019-07-28 NOTE — Consult Note (Signed)
Palliative Medicine Inpatient Consult Note  Reason for consult:  ESRD patient with dementia, difficulty completing dialysis due to disorientation (had recurrent infiltations and may need a tunneled dialysis catheter). Establish goals of care  HPI:  Per intake H&P --> Jeffrey A Grahamis a 86 y.o.malewith medical history significant ofcomplete heart block status post pacemaker, atrial flutter-on Eliquis,dementia, obstructive sleep apnea-not on CPAP,chronic diastolic CHF, pulmonary hypertension,coronary artery disease, ESRD on dialysis (TTS), anemia of chronic disease, hypothyroidism, hyperlipidemia, PVD,presents to ED with altered mental status. Recently initiated on HD.  Palliative care was asked to help establish goals of care.  Clinical Assessment/Goals of Care: I have reviewed medical records including EPIC notes, labs and imaging, received report from bedside RN, assessed the patient. Patient is delirious upon afternoon exam. He has a sitter at bedside who states that he has not been trying to get OOB or pull at lines. Per discussion with his HD nurse the patient appears to be more altered today than on the last day she provided him a treatment.   I called Jeffrey Campbell  to further discuss diagnosis prognosis, GOC, EOL wishes, disposition and options.   I introduced Palliative Medicine as specialized medical care for people living with serious illness. It focuses on providing relief from the symptoms and stress of a serious illness. The goal is to improve quality of life for both the patient and the family.  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  Had two children (son now in [place], dtr in [place]) by his first wife and now lives w/ his 2nd wife, Jeffrey in [place]. Jeffrey has been married to him for about forty-five years  and they share two children., a daughter, [name] and a son, [name]. He use to work as in [employer]. 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 The patient is adoring of his family. Jeffrey attests that he is a loving husband and a wonderful father.   Regarding illicit's patient has a hx tobacco use.   Jeffrey shares that leading up to admission [name] was very altered to the point whereby he urinated on their floor. She said that he had never done this before. He was able to attend to all bADLs prior to this incident.  A detailed discussion was had today regarding advanced directives, there are none on file but Jeffrey would be his main decision maker if need be.    Concepts specific to code status, artifical feeding and hydration, continued IV antibiotics and rehospitalization was had. Per our conversation the patient had always wanted a trial of all therapies to maintain life inclusive of resuscitative efforts. I asked Jeffrey how they had made the determination to start HD. She shares that she had an experience with HD prior to kidney transplant that was favorable. She recommended that Jeffrey Campbell try HD as from her perspective it would improve his quality of life.  I asked Jeffrey if she believes that Jeffrey Campbell would wish for these aggressive measures inclusive of a tunneled HD catheter placement if he were more clear minded. Jeffrey feels that he would as he was always fairly vocal about wanting to continue interventions to continue living.   Discussed the importance of continued conversation with family and their  medical providers regarding overall plan of care and treatment options,  ensuring decisions are within the context of the patients values and GOCs.  Addendum: Given Jeffrey Campbell's frail state I requested that his family come in for a formal goals of care conversation. We agreed to meet tomorrow at 1300. I shared my concerns in the setting of his altered mental status and his bodies ability  to endure the trauma of resuscitation.   SUMMARY OF RECOMMENDATIONS   Full Code / Full Scope for now  TOC --> OP Palliative FU  Chaplain support  Ongoing [place] conversations, plan to have a formal family meeting tomorrow at 72. The family would benefit from seeing Jeffrey Campbell at the present time to aid in more thorough decision making  Code Status/Advance Care Planning: FULL CODE   Palliative Prophylaxis:   Constipation, oral care, Turn Q2H, Delirium   Additional Recommendations (Limitations, Scope, Preferences):  Full scope of care   Psycho-social/Spiritual:   Desire for further Chaplaincy support: Yes  Additional Recommendations: Education on outpatient Palliative services   Prognosis:   Unclear in the setting of new initiation on HD   Discharge Planning: Likely discharge to SNF with OP Palliative Care  PPS: 50%    This conversation/these recommendations were discussed with patient primary care team, [name]  Time In: 1430 Time Out: 1540 Total Time: 68 Greater than 50%  of this time was spent counseling and coordinating care related to the above assessment and plan.  [place] Team Team Cell Phone: [phone] Please utilize secure chat with additional questions, if there is no response within 30 minutes please call the above phone number  Palliative Medicine Team providers are available by phone from 7am to 7pm daily and can be reached through the team cell phone.  Should this patient require assistance outside of these hours, please call the patient's attending physician.

## 2019-07-28 NOTE — Progress Notes (Signed)
PROGRESS NOTE  TRAMAINE SAULS BOF:[id number] DOB: 1932/11/03 DOA: 07/25/2019 PCP: [name], MD  HPI/Recap of past 24 hours: Jeffrey Campbell is a 86 y.o. male with medical history significant of complete heart block status post pacemaker, atrial flutter-on Eliquis, dementia, obstructive sleep apnea-not on CPAP, chronic diastolic CHF, pulmonary hypertension, coronary artery disease, ESRD on dialysis (TTS), anemia of chronic disease, hypothyroidism, hyperlipidemia, PVD, presents to ED with altered mental status.  History is provided by patient's daughter over the phone.  Patient has been more confused for the last 2 days.  He has not slept in last 2 days.  His confusion is getting worse possibly hallucinating.  He tried to use the bathroom in the living room last night.  Not sure if he took his medicines last night or this morning.  He fell out of his bed yesterday morning.  At 6 this morning he tried to leave the house half dressed therefore daughter called EMS and brought patient to ER for further evaluation and management.  He recently started hemodialysis.  He was dialyzed successfully on Friday and Monday however they attempted to dialyze him on Tuesday but could not dialyze due to difficulty with vein access.  His next scheduled session is on Thursday 07/26/19.  Patient lives with his wife at home.  No history of smoking, alcohol, illicit drug use.  ED Course: Upon arrival to ED: Patient afebrile with no leukocytosis.  BP: 199/60, UA negative for infection.  CBC shows normocytic anemia.  COVID-19 negative, ethanol: WNL.  Troponin trended up from 92-1 10.  C CT head is negative for acute findings.  Chest x-ray shows: Cardiomegaly with central vascular engorgement with possible signs of interstitial edema.  [facility] hospitalist consulted for admission for acute metabolic encephalopathy.    07/28/19: Alert but more confused this morning.  Plan for HD.  Assessment/Plan: Principal Problem:  Acute metabolic encephalopathy Active Problems:   OSA (obstructive sleep apnea)   Hypothyroidism   Anemia of chronic disease   Hypertension   ESRD (end stage renal disease) (HCC)   Elevated troponin   Acute metabolic encephalopathy in the setting of dementia CT head nonacute UDS in process TSH normal Ammonia level normal No evidence of active infective process  Leukocytosis, Trending down Possibly reactive Not on antibiotics Afebrile  Elevated troponin Peaked at 132 12 lead EKG showing ST T depression Repeat 12 leads EKG HD 4/29 Next HD planned 5/1 He denies any anginal symptoms  QTC prolongation QTC >500 Repeat EKG and avoid qtc prolonging agents  Severe pulmonary hypertension RSVP 72 as seen on 2 D echo done on 01/05/19 Will need cardiology follow up  Hypothyroidism TSH normal Continue levothyroxine  ESRD on HD TTS HD on 4/29 HD 5/1  Elevated T bili Unclear etiology Possibly [name]  CdCHF Fluid addressed by HD Continue cardiac meds  PVD/HLD Continue statin  Dementia C/w home meds  Essential HTN BP stable for HD C/w home meds  OSA Does not use CPAP at home  Physical debility PT recommended home health PT with 24-hour supervision assistance Continue PT OT with assistance and fall precautions   DVT prophylaxis: Eliquis/TED/SCD  Code Status: Full code-confirmed with the patient daughter  Family Communication: None at bedside.  Disposition Plan: From home.  Anticipate dc to Home vs SNF pending PT OT eval and hemodynamic stability.    Consults called: Nephrology by EDP     Objective: Vitals:   07/28/19 1255 07/28/19 1300 07/28/19 1330 07/28/19 1400  BP: Marland Kitchen)  145/100 (!) 165/75 125/66 (!) 123/48  Pulse: 72 69 78 60  Resp: 16 (!) 21 (!) 22 20  Temp: 98.2 F (36.8 C)     TempSrc: Oral     SpO2: 93%     Weight: 52.2 kg     Height:        Intake/Output Summary (Last 24 hours) at 07/28/2019 1458 Last data filed at 07/28/2019 1021  Gross per 24 hour  Intake 220 ml  Output -  Net 220 ml   Filed Weights   07/26/19 0428 07/27/19 0500 07/28/19 1255  Weight: 50.8 kg 53.5 kg 52.2 kg    Exam: Unchanged from previous exam  . General: 86 y.o. year-old male frail-appearing no acute distress.  Alert and interactive in the setting of dementia . Cardiovascular: Regular rate and rhythm with no rubs or gallops.  Loud systolic murmur.   Marland Kitchen Respiratory: Clear to auscultation no wheezes or rales.   . Abdomen: Soft nontender normal bowel sounds present.   . Musculoskeletal: No lower extremity edema bilaterally. Marland Kitchen Psychiatry: Mood is appropriate for condition and setting   Data Reviewed: CBC: Recent Labs  Lab 07/25/19 1117 07/25/19 1117 07/25/19 1125 07/25/19 1604 07/26/19 0254 07/27/19 0654 07/28/19 0623  WBC 9.2  --   --   --  9.9 13.4* 12.9*  NEUTROABS 8.1*  --   --   --   --   --  11.5*  HGB 9.1*  --  9.5*  --  9.2* 9.3* 9.5*  HCT 29.7*   < > 28.0* 27.7* 29.9* 29.9* 30.3*  MCV 97.7  --   --   --  99.0 98.0 97.1  PLT 164  --   --   --  173 170 158   < > = values in this interval not displayed.   Basic Metabolic Panel: Recent Labs  Lab 07/25/19 1117 07/25/19 1125 07/25/19 2321 07/26/19 0254 07/27/19 0654 07/28/19 0623  NA 143 144  --  144 138 140  K 4.1 4.0  --  3.7 3.9 3.7  CL 103 102  --  106 102 103  CO2 25  --   --  26 28 26   GLUCOSE 124* 120*  --  128* 143* 116*  BUN 52* 48*  --  58* 35* 50*  CREATININE 5.54* 5.70*  --  5.64* 3.96* 4.65*  CALCIUM 11.1*  --   --  10.5* 9.6 10.6*  MG  --   --  2.4  --   --   --   PHOS  --   --  5.2*  --   --  3.6   GFR: Estimated Creatinine Clearance: 8.7 mL/min (A) (by C-G formula based on SCr of 4.65 mg/dL (H)). Liver Function Tests: Recent Labs  Lab 07/25/19 1117 07/26/19 0254 07/27/19 0654 07/28/19 0623  AST 32 33 27 31  ALT 13 14 12 13   ALKPHOS 61 60 64 70  BILITOT 1.1 1.4* 1.3* 1.4*  PROT 8.1 7.2 7.1 7.2  ALBUMIN 4.2 3.8 3.5 3.7   No results for  input(s): LIPASE, AMYLASE in the last 168 hours. Recent Labs  Lab 07/25/19 1602  AMMONIA 24   Coagulation Profile: No results for input(s): INR, PROTIME in the last 168 hours. Cardiac Enzymes: No results for input(s): CKTOTAL, CKMB, CKMBINDEX, TROPONINI in the last 168 hours. BNP (last 3 results) Recent Labs    01/09/19 1019  PROBNP 21,225*   HbA1C: No results for input(s): HGBA1C in the last 72  hours. CBG: Recent Labs  Lab 07/25/19 1039 07/26/19 0137  GLUCAP 130* 114*   Lipid Profile: No results for input(s): CHOL, HDL, LDLCALC, TRIG, CHOLHDL, LDLDIRECT in the last 72 hours. Thyroid Function Tests: Recent Labs    07/25/19 1602  TSH 1.549   Anemia Panel: Recent Labs    07/25/19 1604  VITAMINB12 769   Urine analysis:    Component Value Date/Time   COLORURINE YELLOW 07/25/2019 1116   APPEARANCEUR HAZY (A) 07/25/2019 1116   LABSPEC 1.019 07/25/2019 1116   PHURINE 5.0 07/25/2019 1116   GLUCOSEU NEGATIVE 07/25/2019 1116   [place] 07/25/2019 1116   [place] 07/25/2019 1116   KETONESUR NEGATIVE 07/25/2019 1116   PROTEINUR 100 (A) 07/25/2019 1116   NITRITE NEGATIVE 07/25/2019 1116   LEUKOCYTESUR NEGATIVE 07/25/2019 1116   Sepsis Labs: @LABRCNTIP (procalcitonin:4,lacticidven:4)  ) Recent Results (from the past 240 hour(s))  Respiratory Panel by RT PCR (Flu A&B, Covid) - Nasopharyngeal Swab     Status: None   Collection Time: 07/25/19 12:36 PM   Specimen: Nasopharyngeal Swab  Result Value Ref Range Status   SARS Coronavirus 2 by RT PCR NEGATIVE NEGATIVE Final    Comment: (NOTE) SARS-CoV-2 target nucleic acids are NOT DETECTED. The SARS-CoV-2 RNA is generally detectable in upper respiratoy specimens during the acute phase of infection. The lowest concentration of SARS-CoV-2 viral copies this assay can detect is 131 copies/mL. A negative result does not preclude SARS-Cov-2 infection and should not be used as the sole basis for treatment or  other patient management decisions. A negative result may occur with  improper specimen collection/handling, submission of specimen other than nasopharyngeal swab, presence of viral mutation(s) within the areas targeted by this assay, and inadequate number of viral copies (<131 copies/mL). A negative result must be combined with clinical observations, patient history, and epidemiological information. The expected result is Negative. Fact Sheet for Patients:  PinkCheek.be Fact Sheet for Healthcare Providers:  GravelBags.it This test is not yet ap proved or cleared by the [place] FDA and  has been authorized for detection and/or diagnosis of SARS-CoV-2 by FDA under an Emergency Use Authorization (EUA). This EUA will remain  in effect (meaning this test can be used) for the duration of the COVID-19 declaration under Section 564(b)(1) of the Act, 21 U.S.C. section 360bbb-3(b)(1), unless the authorization is terminated or revoked sooner.    Influenza A by PCR NEGATIVE NEGATIVE Final   Influenza B by PCR NEGATIVE NEGATIVE Final    Comment: (NOTE) The Xpert Xpress SARS-CoV-2/FLU/RSV assay is intended as an aid in  the diagnosis of influenza from Nasopharyngeal swab specimens and  should not be used as a sole basis for treatment. Nasal washings and  aspirates are unacceptable for Xpert Xpress SARS-CoV-2/FLU/RSV  testing. Fact Sheet for Patients: PinkCheek.be Fact Sheet for Healthcare Providers: GravelBags.it This test is not yet approved or cleared by the [place] FDA and  has been authorized for detection and/or diagnosis of SARS-CoV-2 by  FDA under an Emergency Use Authorization (EUA). This EUA will remain  in effect (meaning this test can be used) for the duration of the  Covid-19 declaration under Section 564(b)(1) of the Act, 21  U.S.C. section 360bbb-3(b)(1),  unless the authorization is  terminated or revoked. Performed at [facility], [place] [address] [place], [place] [address]   MRSA PCR Screening     Status: None   Collection Time: 07/25/19 10:06 PM   Specimen: Nasal Mucosa; Nasopharyngeal  Result Value Ref  Range Status   MRSA by PCR NEGATIVE NEGATIVE Final    Comment:        The GeneXpert MRSA Assay (FDA approved for NASAL specimens only), is one component of a comprehensive MRSA colonization surveillance program. It is not intended to diagnose MRSA infection nor to guide or monitor treatment for MRSA infections. Performed at [facility], [place] [address], [place], [place] [address]       Studies: VAS US DUPLEX DIALYSIS ACCESS (AVF, AVG)  Result Date: 07/28/2019 DIALYSIS ACCESS Reason for Exam: Unable to dialyze through AVF/AVG. Access Site: Left Upper Extremity. Access Type: Brachial-cephalic AVF. Limitations: Patient body habitus, patient positioning, patient movement, poor              patient cooperation. Comparison Study: No prior studies. Performing Technologist: Oliver Hum RVT  Examination Guidelines: A complete evaluation includes B-mode imaging, spectral Doppler, color Doppler, and power Doppler as needed of all accessible portions of each vessel. Unilateral testing is considered an integral part of a complete examination. Limited examinations for reoccurring indications may be performed as noted.  Findings:    Summary: Probable competing branch noted in the mid segment of the AVF. Cannot rule out possible pseudoaneurysm within the mid segment of the AVF. The bloodflow is inadequate to evaluate waveforms.  *See table(s) above for measurements and observations.   --------------------------------------------------------------------------------   Preliminary     Scheduled Meds: . amLODipine  10 mg Oral Daily  . apixaban  2.5 mg Oral BID  . atorvastatin  20 mg Oral Daily  . Chlorhexidine Gluconate Cloth  6 each  Topical Q0600  . donepezil  10 mg Oral QHS  . doxazosin  4 mg Oral Daily  . hydrALAZINE  100 mg Oral TID  . levothyroxine  150 mcg Oral QAC breakfast  . memantine  10 mg Oral BID    Continuous Infusions:    LOS: 3 days     Kayleen Memos, MD [facility] Pager [phone]  If 7PM-7AM, please contact night-coverage [email or web address] Password [facility] 07/28/2019, 2:58 PM

## 2019-07-28 NOTE — Progress Notes (Signed)
Left upper extremity dialysis acces duplex has been completed. Preliminary results can be found in CV Proc through chart review.   07/28/19 11:26 AM Jeffrey Campbell RVT

## 2019-07-28 NOTE — Plan of Care (Signed)
  Problem: Education: Goal: Knowledge of General Education information will improve Description: Including pain rating scale, medication(s)/side effects and non-pharmacologic comfort measures Outcome: Progressing   Problem: Clinical Measurements: Goal: Will remain free from infection Outcome: Progressing   Problem: Nutrition: Goal: Adequate nutrition will be maintained Outcome: Progressing   

## 2019-07-29 ENCOUNTER — Encounter (HOSPITAL_COMMUNITY): Payer: Self-pay | Admitting: Internal Medicine

## 2019-07-29 DIAGNOSIS — G9341 Metabolic encephalopathy: Secondary | ICD-10-CM

## 2019-07-29 LAB — CBC
HCT: 35.7 % — ABNORMAL LOW (ref 39.0–52.0)
Hemoglobin: 11.2 g/dL — ABNORMAL LOW (ref 13.0–17.0)
MCH: 29.9 pg (ref 26.0–34.0)
MCHC: 31.4 g/dL (ref 30.0–36.0)
MCV: 95.5 fL (ref 80.0–100.0)
Platelets: 204 10*3/uL (ref 150–400)
RBC: 3.74 MIL/uL — ABNORMAL LOW (ref 4.22–5.81)
RDW: 14.6 % (ref 11.5–15.5)
WBC: 13.4 10*3/uL — ABNORMAL HIGH (ref 4.0–10.5)
nRBC: 0 % (ref 0.0–0.2)

## 2019-07-29 LAB — BASIC METABOLIC PANEL
Anion gap: 16 — ABNORMAL HIGH (ref 5–15)
BUN: 40 mg/dL — ABNORMAL HIGH (ref 8–23)
CO2: 26 mmol/L (ref 22–32)
Calcium: 10.8 mg/dL — ABNORMAL HIGH (ref 8.9–10.3)
Chloride: 100 mmol/L (ref 98–111)
Creatinine, Ser: 4.07 mg/dL — ABNORMAL HIGH (ref 0.61–1.24)
GFR calc Af Amer: 15 mL/min — ABNORMAL LOW (ref >60)
GFR calc non Af Amer: 13 mL/min — ABNORMAL LOW (ref >60)
Glucose, Bld: 143 mg/dL — ABNORMAL HIGH (ref 70–99)
Potassium: 4.2 mmol/L (ref 3.5–5.1)
Sodium: 142 mmol/L (ref 135–145)

## 2019-07-29 LAB — PROCALCITONIN: Procalcitonin: 2.1 ng/mL

## 2019-07-29 LAB — TROPONIN I (HIGH SENSITIVITY): Troponin I (High Sensitivity): 155 ng/L (ref <18)

## 2019-07-29 LAB — MAGNESIUM: Magnesium: 2.1 mg/dL (ref 1.7–2.4)

## 2019-07-29 MED ORDER — VANCOMYCIN HCL IN DEXTROSE 500-5 MG/100ML-% IV SOLN
500.0000 mg | INTRAVENOUS | Status: DC
  Filled 2019-07-29 (×2): qty 100

## 2019-07-29 MED ORDER — VANCOMYCIN HCL IN DEXTROSE 1-5 GM/200ML-% IV SOLN
1000.0000 mg | Freq: Once | INTRAVENOUS | Status: AC
  Administered 2019-07-29: 1000 mg via INTRAVENOUS
  Filled 2019-07-29: qty 200

## 2019-07-29 MED ORDER — APIXABAN 2.5 MG PO TABS
2.5000 mg | ORAL_TABLET | Freq: Two times a day (BID) | ORAL | Status: DC
  Administered 2019-07-29 – 2019-08-02 (×8): 2.5 mg via ORAL
  Filled 2019-07-29 (×8): qty 1

## 2019-07-29 MED ORDER — PIPERACILLIN-TAZOBACTAM IN DEX 2-0.25 GM/50ML IV SOLN
2.2500 g | Freq: Three times a day (TID) | INTRAVENOUS | Status: DC
  Administered 2019-07-29 – 2019-08-02 (×12): 2.25 g via INTRAVENOUS
  Filled 2019-07-29 (×14): qty 50

## 2019-07-29 NOTE — Progress Notes (Signed)
Pharmacy Antibiotic Note  Jeffrey Campbell is a 86 y.o. male admitted on 07/25/2019 and now with concern for sepsis.  Pharmacy has been consulted for Vancomycin + Zosyn dosing.  The patient is ESRD-TTS and has been dialyzing via AVF but now with plans to place HD-TDC tentatively on 5/3 pending further discussions with family.   Plan: - Vancomycin 1g IV x 1 followed by 500 mg/HD-TTS - Zosyn 2.25g IV every 8 hours - Will continue to follow HD schedule/duration, culture results, LOT, and antibiotic de-escalation plans   Height: 5\' 8"  (172.7 cm) Weight: 47.6 kg (105 lb) IBW/kg (Calculated) : 68.4  Temp (24hrs), Avg:97.8 F (36.6 C), Min:97.4 F (36.3 C), Max:98.2 F (36.8 C)  Recent Labs  Lab 07/25/19 1117 07/25/19 1117 07/25/19 1125 07/26/19 0254 07/27/19 0654 07/28/19 0623 07/29/19 0743  WBC 9.2  --   --  9.9 13.4* 12.9* 13.4*  CREATININE 5.54*   < > 5.70* 5.64* 3.96* 4.65* 4.07*   < > = values in this interval not displayed.    Estimated Creatinine Clearance: 9.1 mL/min (A) (by C-G formula based on SCr of 4.07 mg/dL (H)).    Allergies  Allergen Reactions  . Iodinated Diagnostic Agents Other (See Comments)    Right heart cath 07/2016 allegedly "shut down his kidneys" Patient has stage III chronic kidney disease  . Zocor [Simvastatin] Other (See Comments)    Liver problems   . Budesonide-Formoterol Fumarate Other (See Comments)    Dry mouth  . Minoxidil Other (See Comments)    Unknown raction  . Tiotropium Bromide Monohydrate Other (See Comments)    Dry mouth  . Tricor [Fenofibrate]     Unknown reaction    Vanc 5/2 >> Zosyn 5/2 >>  4/28 Fluvid >> neg 4/28 MRSA PCR >> neg 5/2 BCx >>   Thank you for allowing pharmacy to be a part of this patient's care.  Alycia Rossetti, PharmD, BCPS Clinical Pharmacist Clinical phone for 07/29/2019: [phone] 07/29/2019 10:11 AM   **Pharmacist phone directory can now be found on [email or web address] (PW [email or web address]).  Listed under [place].

## 2019-07-29 NOTE — Progress Notes (Signed)
PROGRESS NOTE  Jeffrey Campbell VCB:[id number] DOB: 24-Nov-1932 DOA: 07/25/2019 PCP: [name], MD  HPI/Recap of past 24 hours: Jeffrey Campbell is a 86 y.o. male with medical history significant of complete heart block status post pacemaker, atrial flutter-on Eliquis, dementia, obstructive sleep apnea-not on CPAP, chronic diastolic CHF, pulmonary hypertension, coronary artery disease, ESRD on dialysis (TTS), anemia of chronic disease, hypothyroidism, hyperlipidemia, PVD, presents to ED with altered mental status.  History is provided by patient's daughter over the phone.  Patient has been more confused for the last 2 days.  He has not slept in last 2 days.  His confusion is getting worse possibly hallucinating.  He tried to use the bathroom in the living room last night.  Not sure if he took his medicines last night or this morning.  He fell out of his bed yesterday morning.  At 6 this morning he tried to leave the house half dressed therefore daughter called EMS and brought patient to ER for further evaluation and management.  He recently started hemodialysis.  He was dialyzed successfully on Friday and Monday however they attempted to dialyze him on Tuesday but could not dialyze due to difficulty with vein access.  His next scheduled session is on Thursday 07/26/19.  Patient lives with his wife at home.  No history of smoking, alcohol, illicit drug use.  ED Course: Upon arrival to ED: Patient afebrile with no leukocytosis.  BP: 199/60, UA negative for infection.  CBC shows normocytic anemia.  COVID-19 negative, ethanol: WNL.  Troponin trended up from 92-1 10.  C CT head is negative for acute findings.  Chest x-ray shows: Cardiomegaly with central vascular engorgement with possible signs of interstitial edema.  [facility] hospitalist consulted for admission for acute metabolic encephalopathy.  07/29/19: Seen and examined.  Confused and unable to provide a history.  Elevated troponin this morning with  abnormal twelve-lead EKG done on 07/28/2019.  Repeat twelve-lead EKG.  Cardiology consulted.   Assessment/Plan: Principal Problem:   Acute metabolic encephalopathy Active Problems:   OSA (obstructive sleep apnea)   Hypothyroidism   Anemia of chronic disease   Hypertension   ESRD (end stage renal disease) (HCC)   Elevated troponin   Acute metabolic encephalopathy in the setting of advanced dementia likely multifactorial secondary to hospital acquired delirium, acute illness, worsening dementia CT head nonacute UA negative UDS negative. TSH normal Ammonia level normal  Leukocytosis, unclear source Obtaining blood cultures peripherally x2 Follow cultures Procalcitonin elevated 2.10 Started empirically on IV antibiotics IV vancomycin and Zosyn Repeat CBC with differential in the morning Repeat procalcitonin level in the morning  Elevated troponin Up trending from  132 to 155 Abnormal 12 lead EKG but also ventricular paced, repeat Cardiology consulted Patient is confused and unable to provide a reliable hx  QTC prolongation QTC >500 Repeat EKG and avoid qtc prolonging agents  Severe pulmonary hypertension RSVP 72 as seen on 2 D echo done on 01/05/19 Cardiology will see in consultation  Hypothyroidism TSH normal Continue levothyroxine  ESRD on HD TTS Management per nephrology  History of complete heart block post pacemaker placement Continue to monitor  Elevated T bili Unclear etiology Possibly [name] syndrome  CdCHF Fluid addressed by HD Continue cardiac meds  PVD/HLD Continue statin  Dementia C/w home meds  Essential HTN BP stable for HD C/w home meds  OSA Does not use CPAP at home  Physical debility PT recommended home health PT with 24-hour supervision assistance Continue PT OT with assistance  and fall precautions  Goals of care Palliative care team following and assisting with establishing goals of care Full code Poor functional status,  severe physical debility, with worsening dementia   DVT prophylaxis: Eliquis/TED/SCD  Code Status: Full code-confirmed with the patient daughter  Family Communication: Will call the daughter.  Disposition Plan: From home.  Anticipate dc to Home with 24H assistance/supervision. Barrier to dc leukocytosis of unclear etiology with ongoing work up and IV abx.   Consults called: Nephrology, vascular surgery, cardiology     Objective: Vitals:   07/29/19 0453 07/29/19 0509 07/29/19 0828 07/29/19 1233  BP: 139/75  (!) 145/79 (!) 143/58  Pulse: 75  77 75  Resp: 20  20 20   Temp: 98 F (36.7 C)  98.2 F (36.8 C) 98.2 F (36.8 C)  TempSrc: Oral  Oral Oral  SpO2: 97%  97% 95%  Weight:  47.6 kg    Height:        Intake/Output Summary (Last 24 hours) at 07/29/2019 1438 Last data filed at 07/29/2019 0546 Gross per 24 hour  Intake --  Output 1577 ml  Net -1577 ml   Filed Weights   07/28/19 1255 07/28/19 1720 07/29/19 0509  Weight: 52.2 kg 49 kg 47.6 kg    Exam:  . General: 86 y.o. year-old male frail-appearing in no acute distress.  Confused.   . Cardiovascular: Regular rate and rhythm no rubs or gallops.  Pacemaker in place.  Marland Kitchen  Respiratory: Clear to auscultation no wheezes or rales. . Abdomen: Soft nontender normal bowel sounds present . Musculoskeletal: No lower extremity edema bilaterally. Marland Kitchen Psychiatry: Unable to assess mood due to confusion.   Data Reviewed: CBC: Recent Labs  Lab 07/25/19 1117 07/25/19 1117 07/25/19 1125 07/25/19 1604 07/26/19 0254 07/27/19 0654 07/28/19 0623 07/29/19 0743  WBC 9.2  --   --   --  9.9 13.4* 12.9* 13.4*  NEUTROABS 8.1*  --   --   --   --   --  11.5*  --   HGB 9.1*   < > 9.5*  --  9.2* 9.3* 9.5* 11.2*  HCT 29.7*   < > 28.0* 27.7* 29.9* 29.9* 30.3* 35.7*  MCV 97.7  --   --   --  99.0 98.0 97.1 95.5  PLT 164  --   --   --  173 170 158 204   < > = values in this interval not displayed.   Basic Metabolic Panel: Recent Labs  Lab  07/25/19 1117 07/25/19 1117 07/25/19 1125 07/25/19 2321 07/26/19 0254 07/27/19 0654 07/28/19 0623 07/29/19 0743  NA 143   < > 144  --  144 138 140 142  K 4.1   < > 4.0  --  3.7 3.9 3.7 4.2  CL 103   < > 102  --  106 102 103 100  CO2 25  --   --   --  26 28 26 26   GLUCOSE 124*   < > 120*  --  128* 143* 116* 143*  BUN 52*   < > 48*  --  58* 35* 50* 40*  CREATININE 5.54*   < > 5.70*  --  5.64* 3.96* 4.65* 4.07*  CALCIUM 11.1*  --   --   --  10.5* 9.6 10.6* 10.8*  MG  --   --   --  2.4  --   --   --  2.1  PHOS  --   --   --  5.2*  --   --  3.6  --    < > = values in this interval not displayed.   GFR: Estimated Creatinine Clearance: 9.1 mL/min (A) (by C-G formula based on SCr of 4.07 mg/dL (H)). Liver Function Tests: Recent Labs  Lab 07/25/19 1117 07/26/19 0254 07/27/19 0654 07/28/19 0623  AST 32 33 27 31  ALT 13 14 12 13   ALKPHOS 61 60 64 70  BILITOT 1.1 1.4* 1.3* 1.4*  PROT 8.1 7.2 7.1 7.2  ALBUMIN 4.2 3.8 3.5 3.7   No results for input(s): LIPASE, AMYLASE in the last 168 hours. Recent Labs  Lab 07/25/19 1602  AMMONIA 24   Coagulation Profile: No results for input(s): INR, PROTIME in the last 168 hours. Cardiac Enzymes: No results for input(s): CKTOTAL, CKMB, CKMBINDEX, TROPONINI in the last 168 hours. BNP (last 3 results) Recent Labs    01/09/19 1019  PROBNP 21,225*   HbA1C: No results for input(s): HGBA1C in the last 72 hours. CBG: Recent Labs  Lab 07/25/19 1039 07/26/19 0137 07/28/19 1708  GLUCAP 130* 114* 130*   Lipid Profile: No results for input(s): CHOL, HDL, LDLCALC, TRIG, CHOLHDL, LDLDIRECT in the last 72 hours. Thyroid Function Tests: No results for input(s): TSH, T4TOTAL, FREET4, T3FREE, THYROIDAB in the last 72 hours. Anemia Panel: No results for input(s): VITAMINB12, FOLATE, FERRITIN, TIBC, IRON, RETICCTPCT in the last 72 hours. Urine analysis:    Component Value Date/Time   COLORURINE YELLOW 07/25/2019 1116   APPEARANCEUR HAZY (A)  07/25/2019 1116   LABSPEC 1.019 07/25/2019 1116   PHURINE 5.0 07/25/2019 1116   GLUCOSEU NEGATIVE 07/25/2019 1116   [place] 07/25/2019 1116   [place] 07/25/2019 1116   KETONESUR NEGATIVE 07/25/2019 1116   PROTEINUR 100 (A) 07/25/2019 1116   NITRITE NEGATIVE 07/25/2019 1116   LEUKOCYTESUR NEGATIVE 07/25/2019 1116   Sepsis Labs: @LABRCNTIP (procalcitonin:4,lacticidven:4)  ) Recent Results (from the past 240 hour(s))  Respiratory Panel by RT PCR (Flu A&B, Covid) - Nasopharyngeal Swab     Status: None   Collection Time: 07/25/19 12:36 PM   Specimen: Nasopharyngeal Swab  Result Value Ref Range Status   SARS Coronavirus 2 by RT PCR NEGATIVE NEGATIVE Final    Comment: (NOTE) SARS-CoV-2 target nucleic acids are NOT DETECTED. The SARS-CoV-2 RNA is generally detectable in upper respiratoy specimens during the acute phase of infection. The lowest concentration of SARS-CoV-2 viral copies this assay can detect is 131 copies/mL. A negative result does not preclude SARS-Cov-2 infection and should not be used as the sole basis for treatment or other patient management decisions. A negative result may occur with  improper specimen collection/handling, submission of specimen other than nasopharyngeal swab, presence of viral mutation(s) within the areas targeted by this assay, and inadequate number of viral copies (<131 copies/mL). A negative result must be combined with clinical observations, patient history, and epidemiological information. The expected result is Negative. Fact Sheet for Patients:  PinkCheek.be Fact Sheet for Healthcare Providers:  GravelBags.it This test is not yet ap proved or cleared by the [place] FDA and  has been authorized for detection and/or diagnosis of SARS-CoV-2 by FDA under an Emergency Use Authorization (EUA). This EUA will remain  in effect (meaning this test can be used) for the  duration of the COVID-19 declaration under Section 564(b)(1) of the Act, 21 U.S.C. section 360bbb-3(b)(1), unless the authorization is terminated or revoked sooner.    Influenza A by PCR NEGATIVE NEGATIVE Final   Influenza B by PCR NEGATIVE NEGATIVE Final    Comment: (  NOTE) The Xpert Xpress SARS-CoV-2/FLU/RSV assay is intended as an aid in  the diagnosis of influenza from Nasopharyngeal swab specimens and  should not be used as a sole basis for treatment. Nasal washings and  aspirates are unacceptable for Xpert Xpress SARS-CoV-2/FLU/RSV  testing. Fact Sheet for Patients: PinkCheek.be Fact Sheet for Healthcare Providers: GravelBags.it This test is not yet approved or cleared by the [place] FDA and  has been authorized for detection and/or diagnosis of SARS-CoV-2 by  FDA under an Emergency Use Authorization (EUA). This EUA will remain  in effect (meaning this test can be used) for the duration of the  Covid-19 declaration under Section 564(b)(1) of the Act, 21  U.S.C. section 360bbb-3(b)(1), unless the authorization is  terminated or revoked. Performed at [facility], [place] [address], [place], [place] [address]   MRSA PCR Screening     Status: None   Collection Time: 07/25/19 10:06 PM   Specimen: Nasal Mucosa; Nasopharyngeal  Result Value Ref Range Status   MRSA by PCR NEGATIVE NEGATIVE Final    Comment:        The GeneXpert MRSA Assay (FDA approved for NASAL specimens only), is one component of a comprehensive MRSA colonization surveillance program. It is not intended to diagnose MRSA infection nor to guide or monitor treatment for MRSA infections. Performed at [facility], [place] [address] [place], [place] [address]   Culture, blood (routine x 2)     Status: None (Preliminary result)   Collection Time: 07/29/19  7:42 AM   Specimen: BLOOD RIGHT ARM  Result Value Ref Range Status   Specimen  Description BLOOD RIGHT ARM  Final   Special Requests   Final    BOTTLES DRAWN AEROBIC AND ANAEROBIC Blood Culture adequate volume Performed at [facility], [place] [address] [place], [place] [address]    Culture PENDING  Incomplete   Report Status PENDING  Incomplete      Studies: No results found.  Scheduled Meds: . amLODipine  10 mg Oral Daily  . atorvastatin  20 mg Oral Daily  . Chlorhexidine Gluconate Cloth  6 each Topical Q0600  . donepezil  10 mg Oral QHS  . doxazosin  4 mg Oral Daily  . hydrALAZINE  100 mg Oral TID  . levothyroxine  150 mcg Oral QAC breakfast  . memantine  10 mg Oral BID  . [START ON 07/31/2019] vancomycin  500 mg Intravenous Q T,Th,Sa-HD    Continuous Infusions: . piperacillin-tazobactam (ZOSYN)  IV 2.25 g (07/29/19 1135)     LOS: 4 days     Kayleen Memos, MD [facility] Pager [phone]  If 7PM-7AM, please contact night-coverage [email or web address] Password [facility] 07/29/2019, 2:38 PM

## 2019-07-29 NOTE — Progress Notes (Signed)
Patient's daughter called and stated that patient is not to have surgery tomorrow. Notified Dr. Carlis Abbott to make aware. MD stated that he had been made aware by palliative NP.

## 2019-07-29 NOTE — Progress Notes (Addendum)
Patient ID: Jeffrey Campbell, male   DOB: 12-17-1932, 86 y.o.   MRN: [id number]  [facility] Progress Note   Assessment/ Plan:   1.  Altered mental status in patient with underlying dementia: He is disoriented but pleasant and noncombative-again, this has not improved with hemodialysis (with supposition that uremia may have a contributory role to his earlier presentation).  I appreciate assistance from the palliative care service and will reach out to [name] daughter today to offer my opinion as well. 2. ESRD: He is usually on a TTS dialysis schedule and underwent hemodialysis yesterday with a sitter at bedside for safety-able to cannulate left BCF however, high risk for infiltration/needle dislodgment given his confusion.  I recommend that his daughter visits with him today prior to making decision regarding continued care; at this point I question the utility of dialysis as it is not going to improve his quality of life and indeed may worsen it with dialysis associated complications.  If the desire is still to proceed with dialysis, he will need a tunneled catheter for safe delivery as outpatient dialysis staffing ratio is not equipped for a one-on-one setting and cannot provide a sitter. 3. Anemia: With marginally low hemoglobin and hematocrit, monitor on ESA. 4. CKD-MBD: Hypercalcemia noted, continue low calcium dialysate.  Phosphorus at goal. 5. Nutrition: Continue renal diet with ongoing nutritional supplementation-erratic intake. 6. Hypertension: Blood pressures within acceptable range with ongoing medication/HD  0920 update: Discussed with his wife Jeffrey Campbell and daughter Jeffrey Campbell over the phone and offered my opinion/assessment of Jeffrey Campbell current situation.  I am especially concerned about his ongoing delirium/underlying dementia that remains persistent if not worse since his hospitalization.  They are going to come in this afternoon to visit with him and talk to the  palliative care service.  They did not want to commit to stopping dialysis at this time and would like placement of a dialysis catheter.  I will request Dr. Carlis Abbott (vascular surgery) to assist with this potentially tomorrow-this may be subject to change based on the family/palliative care meeting later today.  Subjective:   Confused/disoriented overnight.  He is unable to give me any verbal responses to questions this morning.  I appreciate input from the palliative care service.   Objective:   BP 139/75 (BP Location: Right Arm)   Pulse 75   Temp 98 F (36.7 C) (Oral)   Resp 20   Ht 5\' 8"  (1.727 m)   Wt 47.6 kg   SpO2 97%   BMI 15.97 kg/m   Physical Exam: Gen: Resting comfortably in bed, nonverbal. CVS: Pulse regular rhythm, normal rate, 2/6 ejection systolic murmur Resp: Clear to location bilaterally, no rales/rhonchi Abd: Soft, flat, nontender, bowel sounds normal Ext: No lower extremity edema, left brachiocephalic fistula is pulsatile with some ecchymosis from recent infiltration.  With protective mittens in place.  Labs: BMET Recent Labs  Lab 07/25/19 1117 07/25/19 1125 07/25/19 2321 07/26/19 0254 07/27/19 0654 07/28/19 0623  NA 143 144  --  144 138 140  K 4.1 4.0  --  3.7 3.9 3.7  CL 103 102  --  106 102 103  CO2 25  --   --  26 28 26   GLUCOSE 124* 120*  --  128* 143* 116*  BUN 52* 48*  --  58* 35* 50*  CREATININE 5.54* 5.70*  --  5.64* 3.96* 4.65*  CALCIUM 11.1*  --   --  10.5* 9.6 10.6*  PHOS  --   --  5.2*  --   --  3.6   CBC Recent Labs  Lab 07/25/19 1117 07/25/19 1117 07/25/19 1125 07/25/19 1604 07/26/19 0254 07/27/19 0654 07/28/19 0623  WBC 9.2  --   --   --  9.9 13.4* 12.9*  NEUTROABS 8.1*  --   --   --   --   --  11.5*  HGB 9.1*   < > 9.5*  --  9.2* 9.3* 9.5*  HCT 29.7*   < > 28.0* 27.7* 29.9* 29.9* 30.3*  MCV 97.7  --   --   --  99.0 98.0 97.1  PLT 164  --   --   --  173 170 158   < > = values in this interval not displayed.     Medications:     . amLODipine  10 mg Oral Daily  . apixaban  2.5 mg Oral BID  . atorvastatin  20 mg Oral Daily  . Chlorhexidine Gluconate Cloth  6 each Topical Q0600  . donepezil  10 mg Oral QHS  . doxazosin  4 mg Oral Daily  . hydrALAZINE  100 mg Oral TID  . levothyroxine  150 mcg Oral QAC breakfast  . memantine  10 mg Oral BID   Elmarie Shiley, MD 07/29/2019, 7:33 AM

## 2019-07-29 NOTE — Progress Notes (Signed)
[facility]  Subjective  - family wants to purse [facility] after multiple discussions with nephology.   Objective (!) 145/79 77 98.2 F (36.8 C) (Oral) 20 97%  Intake/Output Summary (Last 24 hours) at 07/29/2019 0929 Last data filed at 07/29/2019 0546 Gross per 24 hour  Intake 20 ml  Output 1577 ml  Net -1557 ml    Left brachiocephalic AVF with good thrill   Laboratory Lab Results: Recent Labs    07/28/19 0623 07/29/19 0743  WBC 12.9* 13.4*  HGB 9.5* 11.2*  HCT 30.3* 35.7*  PLT 158 204   BMET Recent Labs    07/28/19 0623 07/29/19 0743  NA 140 142  K 3.7 4.2  CL 103 100  CO2 26 26  GLUCOSE 116* 143*  BUN 50* 40*  CREATININE 4.65* 4.07*  CALCIUM 10.6* 10.8*    COAG Lab Results  Component Value Date   INR 1.2 02/05/2019   INR 1.00 10/15/2016   INR 1.1 07/21/2016   No results found for: PTT  Assessment/Planning:  86 year old male with underlying dementia who has been doing poorly on dialysis with a left arm brachiocephalic AV fistula due to a movement and dislodgment of the needles.  Multiple discussions with nephrology and palliative care over the weekend ultimately family elected to make him catheter dependent.  I will post him for [facility] tomorrow with Dr. Donnetta Hutching.  I have canceled his Eliquis in anticipation of the OR.  Please keep n.p.o. after midnight.  Marty Heck 07/29/2019 9:29 AM --

## 2019-07-29 NOTE — Progress Notes (Signed)
TDC cancelled for Monday after discussion with nephrology and palliative care.  Removed from OR schedule tomorrow.  Marty Heck, MD [facility] Office: [place]

## 2019-07-29 NOTE — Plan of Care (Signed)

## 2019-07-29 NOTE — Progress Notes (Signed)
ANTICOAGULATION CONSULT NOTE - Initial Consult  Pharmacy Consult for apixaban Indication: atrial fibrillation  Allergies  Allergen Reactions  . Iodinated Diagnostic Agents Other (See Comments)    Right heart cath 07/2016 allegedly "shut down his kidneys" Patient has stage III chronic kidney disease  . Zocor [Simvastatin] Other (See Comments)    Liver problems   . Budesonide-Formoterol Fumarate Other (See Comments)    Dry mouth  . Minoxidil Other (See Comments)    Unknown raction  . Tiotropium Bromide Monohydrate Other (See Comments)    Dry mouth  . Tricor [Fenofibrate]     Unknown reaction    Patient Measurements: Height: 5\' 8"  (172.7 cm) Weight: 47.6 kg (105 lb) IBW/kg (Calculated) : 68.4  Vital Signs: Temp: 98.2 F (36.8 C) (05/02 1233) Temp Source: Oral (05/02 1233) BP: 143/58 (05/02 1233) Pulse Rate: 75 (05/02 1233)  Labs: Recent Labs    07/27/19 0654 07/27/19 0654 07/28/19 0623 07/29/19 0743  HGB 9.3*   < > 9.5* 11.2*  HCT 29.9*  --  30.3* 35.7*  PLT 170  --  158 204  CREATININE 3.96*  --  4.65* 4.07*  TROPONINIHS  --   --   --  155*   < > = values in this interval not displayed.    Estimated Creatinine Clearance: 9.1 mL/min (A) (by C-G formula based on SCr of 4.07 mg/dL (H)).   Medical History: Past Medical History:  Diagnosis Date  . Anemia    low iron  . BPH (benign prostatic hyperplasia)   . CAD (coronary artery disease)   . Carotid artery stenosis    1-39% bilateral carotid artery stenosis and < 50% stenosis in the right CCA by dopplers 06/2017  . CKD (chronic kidney disease), stage III    Stage 4  . Diastolic dysfunction   . Glaucoma   . Graves disease   . History of ETOH abuse   . Hyperlipemia   . Hypertension   . Hyperthyroidism 08/26/10   radioactive iodine therapy   . Memory loss   . Mitral regurgitation echo 2015   mild  . Multiple thyroid nodules   . MVP (mitral valve prolapse) 11/2012   posterior MVP  . OSA (obstructive sleep  apnea)    upper airway resistance syndrome with RDI 18/hr - not on CPAP due to insurance not covering  . Pre-diabetes   . PUD (peptic ulcer disease)   . Pulmonary hypertension ([place]) echo 2015   Group 2 with pulmonary venous HTN and Group 3 with OSA  . Upper airway resistance syndrome     Medications:  Medications Prior to Admission  Medication Sig Dispense Refill Last Dose  . amLODipine (NORVASC) 10 MG tablet Take 1 tablet (10 mg total) by mouth daily. 30 tablet 3 07/25/2019 at Unknown time  . apixaban (ELIQUIS) 2.5 MG TABS tablet Take 1 tablet (2.5 mg total) by mouth 2 (two) times daily. 180 tablet 3 07/25/2019 at 0800  . atorvastatin (LIPITOR) 20 MG tablet Take 1 tablet (20 mg total) by mouth daily. 30 tablet 3 07/25/2019 at Unknown time  . Brinzolamide-Brimonidine (SIMBRINZA) 1-0.2 % SUSP Place 1 drop into both eyes 2 (two) times daily.    unknown  . donepezil (ARICEPT) 10 MG tablet Take 1 tablet (10 mg total) by mouth at bedtime. 30 tablet 11 07/24/2019 at Unknown time  . doxazosin (CARDURA) 4 MG tablet Take 1 tablet (4 mg total) by mouth daily. (Patient taking differently: Take 4 mg by mouth 2 (two)  times daily. ) 30 tablet 3 07/25/2019 at Unknown time  . hydrALAZINE (APRESOLINE) 100 MG tablet Take 1 tablet (100 mg total) by mouth 3 (three) times daily. Please make yearly appt with Dr. Radford Pax for October. 1st attempt 90 tablet 3 07/25/2019 at Unknown time  . levothyroxine (SYNTHROID, LEVOTHROID) 150 MCG tablet Take 150 mcg by mouth daily before breakfast.   07/25/2019 at Unknown time  . memantine (NAMENDA) 10 MG tablet Take 1 tablet (10 mg total) by mouth 2 (two) times daily. 180 tablet 3 07/25/2019 at Unknown time  . triamcinolone ointment (KENALOG) 0.5 % Apply 1 application topically 2 (two) times daily as needed (rash).   unknown  . cloNIDine (CATAPRES) 0.2 MG tablet Take 1.5 tablets (0.3 mg total) by mouth 2 (two) times daily. (Patient not taking: Reported on 07/25/2019) 90 tablet 3 Not Taking  at Unknown time  . Epoetin Alfa (PROCRIT IJ) Inject 5,000 Units as directed every 14 (fourteen) days.    unknown  . torsemide (DEMADEX) 20 MG tablet TAKE 4 TABLETS (80 MG TOTAL) BY MOUTH DAILY. (Patient not taking: No sig reported) 360 tablet 1 Not Taking at Unknown time    Assessment: 82 YOM on home apixaban for Afib. Apixaban was d/c'ed earlier to day with plans to go to OR for dialysis access. Procedure cancelled. Pharmacy consulted to resume dosing. Last dose of apixaban was this AM.   H/H improved. Plt wnl. SCr 4.07.   Goal of Therapy:  Stroke preventino Monitor platelets by anticoagulation protocol: Yes   Plan:  -Resume apixaban 2.5 mg twice daily. Next dose tonight -Monitor CBC and for s/s of bleeding   Albertina Parr, PharmD., BCPS, BCCCP Clinical Pharmacist Please refer to [facility] for unit-specific pharmacist

## 2019-07-29 NOTE — Plan of Care (Signed)
  Problem: Education: Goal: Knowledge of General Education information will improve Description: Including pain rating scale, medication(s)/side effects and non-pharmacologic comfort measures Outcome: Progressing   Problem: Pain Managment: Goal: General experience of comfort will improve Outcome: Progressing   Problem: Safety: Goal: Ability to remain free from injury will improve Outcome: Progressing   

## 2019-07-29 NOTE — Consult Note (Signed)
CARDIOLOGY CONSULT NOTE  Patient ID: MACKINLEY KIEHN MRN: [id number] DOB/AGE: [id number] 84 y.o.  Admit date: 07/25/2019 Primary Physician [name], MD Primary Cardiologist Fransico Him, MD Chief Complaint  Elevated cardiac enzyme Requesting  [name]  HPI:   The patient has a history significant for coronary artery disease, CKD stage III-IV, PVD, pulmonary hypertension, COPD, diastolic heart failure, dementia, and persistent atrial flutter.  In November 2020 he had cardioversion.  Post cardioversion his ventricular rates were in the 30s.  He is now status post [place] dual-chamber pacemaker implanted 02/09/2019.  He has had an extensive w/u for pulmonary HTN (Group 80from pulmonary venous HTN in setting of elevated left heart pressure) with PFTs showing mild COPD but moderate to severely reduced DLCO, neg VQ scan for PE and chest CT with no interstitial lung disease. He does have OSA but is intolerant to CPAP. He also has bilateral renal artery stenosis, mesenteric artery stenosis and distal aortic stenosis followed by Dr. Fletcher Anon.  He presented to the hospital with increased confusion.  He fell out of his bed.  He recently started dialysis but they could not complete this one day this week because of access issues.   Cardiac enzymes ere drawn in the ED. We are called because his hsTrop was 155 and has trended up slightly. Marland Kitchen    He is unable to give any history.  He currently denies any pain.  His wife is at the bedside.  He walks around at home and actually has been very restless.  However, she has not seen him acting as if he was having any chest pain , SOB or other cardiac complaints.    Looking through the ED and other hospital notes I don't see any mention of pain as a presenting complaint.  His EKG is paced.    Past Medical History:  Diagnosis Date  . Anemia    low iron  . BPH (benign prostatic hyperplasia)   . CAD (coronary artery disease)   . Carotid artery stenosis    1-39% bilateral carotid artery stenosis and < 50% stenosis in the right CCA by dopplers 06/2017  . CKD (chronic kidney disease), stage III    Stage 4  . Diastolic dysfunction   . Glaucoma   . Graves disease   . Heart murmur, systolic   . History of ETOH abuse   . History of PFTs 05/2010   moderate airflow obstruction w reduced DLCO by PFT   . Hyperlipemia   . Hypertension   . Hyperthyroidism 08/26/10   radioactive iodine therapy   . Memory loss   . Mitral regurgitation echo 2015   mild  . Multiple thyroid nodules   . MVP (mitral valve prolapse) 11/2012   posterior MVP  . Organic impotence   . OSA (obstructive sleep apnea)    upper airway resistance syndrome with RDI 18/hr - not on CPAP due to insurance not covering  . Pre-diabetes   . PUD (peptic ulcer disease)   . Pulmonary hypertension ([place]) echo 2015   Group 2 with pulmonary venous HTN and Group 3 with OSA  . Shutdown renal    after cardiac cath  . Upper airway resistance syndrome     Past Surgical History:  Procedure Laterality Date  . APPENDECTOMY    . AV FISTULA PLACEMENT Left 10/18/2017   Procedure: ARTERIOVENOUS (AV) FISTULA CREATION ARM;  Surgeon: Waynetta Sandy, MD;  Location: [facility];  Service: Vascular;  Laterality: Left;  .  CARDIAC CATHETERIZATION    . CARDIAC CATHETERIZATION N/A 02/17/2015   Procedure: Right Heart Cath;  Surgeon: Larey Dresser, MD;  Location: [facility];  Service: Cardiovascular;  Laterality: N/A;  . CARDIOVERSION N/A 02/08/2019   Procedure: CARDIOVERSION;  Surgeon: Donato Heinz, MD;  Location: [facility] ENDOSCOPY;  Service: Endoscopy;  Laterality: N/A;  . ESOPHAGOGASTRODUODENOSCOPY (EGD) WITH PROPOFOL Left 10/16/2016   Procedure: ESOPHAGOGASTRODUODENOSCOPY (EGD) WITH PROPOFOL;  Surgeon: Ronnette Juniper, MD;  Location: [facility];  Service: Gastroenterology;  Laterality: Left;  . heart catherization    . HERNIA REPAIR  10/2009  . IR RADIOLOGIST EVAL & MGMT  09/28/2016  . IR  RADIOLOGIST EVAL & MGMT  10/19/2016  . IR RADIOLOGIST EVAL & MGMT  01/18/2017  . PACEMAKER IMPLANT N/A 02/09/2019   Procedure: PACEMAKER IMPLANT;  Surgeon: Constance Haw, MD;  Location: [facility];  Service: Cardiovascular;  Laterality: N/A;  . RIGHT HEART CATH N/A 07/28/2016   Procedure: Right Heart Cath;  Surgeon: Larey Dresser, MD;  Location: [facility];  Service: Cardiovascular;  Laterality: N/A;    Allergies  Allergen Reactions  . Iodinated Diagnostic Agents Other (See Comments)    Right heart cath 07/2016 allegedly "shut down his kidneys" Patient has stage III chronic kidney disease  . Zocor [Simvastatin] Other (See Comments)    Liver problems   . Budesonide-Formoterol Fumarate Other (See Comments)    Dry mouth  . Minoxidil Other (See Comments)    Unknown raction  . Tiotropium Bromide Monohydrate Other (See Comments)    Dry mouth  . Tricor [Fenofibrate]     Unknown reaction   Medications Prior to Admission  Medication Sig Dispense Refill Last Dose  . amLODipine (NORVASC) 10 MG tablet Take 1 tablet (10 mg total) by mouth daily. 30 tablet 3 07/25/2019 at Unknown time  . apixaban (ELIQUIS) 2.5 MG TABS tablet Take 1 tablet (2.5 mg total) by mouth 2 (two) times daily. 180 tablet 3 07/25/2019 at 0800  . atorvastatin (LIPITOR) 20 MG tablet Take 1 tablet (20 mg total) by mouth daily. 30 tablet 3 07/25/2019 at Unknown time  . Brinzolamide-Brimonidine (SIMBRINZA) 1-0.2 % SUSP Place 1 drop into both eyes 2 (two) times daily.    unknown  . donepezil (ARICEPT) 10 MG tablet Take 1 tablet (10 mg total) by mouth at bedtime. 30 tablet 11 07/24/2019 at Unknown time  . doxazosin (CARDURA) 4 MG tablet Take 1 tablet (4 mg total) by mouth daily. (Patient taking differently: Take 4 mg by mouth 2 (two) times daily. ) 30 tablet 3 07/25/2019 at Unknown time  . hydrALAZINE (APRESOLINE) 100 MG tablet Take 1 tablet (100 mg total) by mouth 3 (three) times daily. Please make yearly appt with  Dr. Radford Pax for October. 1st attempt 90 tablet 3 07/25/2019 at Unknown time  . levothyroxine (SYNTHROID, LEVOTHROID) 150 MCG tablet Take 150 mcg by mouth daily before breakfast.   07/25/2019 at Unknown time  . memantine (NAMENDA) 10 MG tablet Take 1 tablet (10 mg total) by mouth 2 (two) times daily. 180 tablet 3 07/25/2019 at Unknown time  . triamcinolone ointment (KENALOG) 0.5 % Apply 1 application topically 2 (two) times daily as needed (rash).   unknown  . cloNIDine (CATAPRES) 0.2 MG tablet Take 1.5 tablets (0.3 mg total) by mouth 2 (two) times daily. (Patient not taking: Reported on 07/25/2019) 90 tablet 3 Not Taking at Unknown time  . Epoetin Alfa (PROCRIT IJ) Inject 5,000 Units as directed every  14 (fourteen) days.    unknown  . torsemide (DEMADEX) 20 MG tablet TAKE 4 TABLETS (80 MG TOTAL) BY MOUTH DAILY. (Patient not taking: No sig reported) 360 tablet 1 Not Taking at Unknown time   Family History  Problem Relation Age of Onset  . Kidney failure Father   . Hypertension Father   . Colon cancer Mother   . Colon cancer Brother   . Lung disease Brother   . Colon cancer Maternal Uncle     Social History   Socioeconomic History  . Marital status: Married    Spouse name: Not on file  . Number of children: 4  . Years of education: completed high school  . Highest education level: Not on file  Occupational History  . 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 Smoking status: Former Smoker    Packs/day: 2.00    Years: 40.00    Pack years: 80.00    Types: Cigarettes    Quit date: 03/29/1984    Years since quitting: 35.3  . Smokeless tobacco: Never Used  Substance and Sexual Activity  . Alcohol use: No    Comment: quit in 1981  . Drug use: No  . Sexual activity: Not on file  Other Topics Concern  . Not on file  Social History Narrative   Lives at home with his wife.   Right-handed.   Occasional use of caffeine.   Social Determinants of  Health   Financial Resource Strain:   . Difficulty of Paying Living Expenses:   Food Insecurity:   . Worried About Charity fundraiser in the Last Year:   . Arboriculturist in the Last Year:   Transportation Needs:   . Film/video editor (Medical):   Marland Kitchen Lack of Transportation (Non-Medical):   Physical Activity:   . Days of Exercise per Week:   . Minutes of Exercise per Session:   Stress:   . Feeling of Stress :   Social Connections:   . Frequency of Communication with Friends and Family:   . Frequency of Social Gatherings with Friends and Family:   . Attends Religious Services:   . Active Member of Clubs or Organizations:   . Attends Archivist Meetings:   Marland Kitchen Marital Status:   Intimate Partner Violence:   . Fear of Current or Ex-Partner:   . Emotionally Abused:   Marland Kitchen Physically Abused:   . Sexually Abused:      ROS:  Unable to obtain secondary to dementia.   Physical Exam: Blood pressure (!) 145/79, pulse 77, temperature 98.2 F (36.8 C), temperature source Oral, resp. rate 20, height 5\' 8"  (1.727 m), weight 47.6 kg, SpO2 97 %.  GENERAL:  Very frail appearing and confused.  HEENT:  Pupils equal round and reactive, fundi not visualized, oral mucosa unremarkable NECK:  No jugular venous distention, waveform within normal limits, carotid upstroke brisk and symmetric, no bruits, no thyromegaly LYMPHATICS:  No cervical, inguinal adenopathy LUNGS:  Clear to auscultation bilaterally BACK:  No CVA tenderness CHEST:  Well healed pacer pocket HEART:  PMI not displaced or sustained,S1 and S2 within normal limits, no S3, no S4, no clicks, no rubs, no murmurs ABD:  Flat, positive bowel sounds normal in frequency in pitch, no bruits, no rebound, no guarding, no midline pulsatile mass, no hepatomegaly, no splenomegaly EXT:  2 plus pulses throughout, no edema, no cyanosis no clubbing SKIN:  No rashes no nodules NEURO:  Awake and unable to follow commands but non focal.  PSYCH:   Confused.     Labs: Lab Results  Component Value Date   BUN 40 (H) 07/29/2019   Lab Results  Component Value Date   CREATININE 4.07 (H) 07/29/2019   Lab Results  Component Value Date   NA 142 07/29/2019   K 4.2 07/29/2019   CL 100 07/29/2019   CO2 26 07/29/2019   Lab Results  Component Value Date   TROPONINI 0.06 ([place]) 09/02/2018   Lab Results  Component Value Date   WBC 13.4 (H) 07/29/2019   HGB 11.2 (L) 07/29/2019   HCT 35.7 (L) 07/29/2019   MCV 95.5 07/29/2019   PLT 204 07/29/2019   Lab Results  Component Value Date   CHOL 117 05/04/2019   HDL 49 05/04/2019   LDLCALC 56 05/04/2019   TRIG 61 05/04/2019   CHOLHDL 2.4 05/04/2019   Lab Results  Component Value Date   ALT 13 07/28/2019   AST 31 07/28/2019   ALKPHOS 70 07/28/2019   BILITOT 1.4 (H) 07/28/2019   ECHO:     1. Patient appears to be in atrial flutter with slow ventricular rate, with pronounced fluttering of mitral valve seen on short axis and m mode. 2. Left ventricular ejection fraction, by visual estimation, is 55 to 60%. The left ventricle has normal function. Mildly increased left ventricular size. There is mildly increased left ventricular hypertrophy. 3. Left ventricular diastolic Doppler parameters are indeterminate pattern of LV diastolic filling. 4. Global right ventricle has moderately reduced systolic function.The right ventricular size is moderately enlarged. Mildly increased right ventricular wall thickness. 5. Left atrial size was severely dilated. 6. Right atrial size was severely dilated. 7. Small pericardial effusion. 8. Moderate calcification of the mitral valve leaflet(s). 9. Moderate thickening of the mitral valve leaflet(s). 10. The mitral valve is degenerative. Trace mitral valve regurgitation. 11. The tricuspid valve is normal in structure. Tricuspid valve regurgitation is mild. 12. The aortic valve is tricuspid Aortic valve regurgitation is trivial by color flow Doppler.  Mild aortic valve sclerosis without stenosis. 13. The pulmonic valve was normal in structure. Pulmonic valve regurgitation is trivial by color flow Doppler. 14. Severely elevated pulmonary artery systolic pressure. 15. The inferior vena cava is dilated in size with <50% respiratory variability, suggesting right atrial pressure of 15 mmHg. 16. Severe pulmonary hypertension, RVSP 72 mmHg.   Radiology:   CXR: Cardiomegaly with central vascular engorgement and possible signs of interstitial edema.  EKG:   NSR with ventricular pacing.   ASSESSMENT AND PLAN:   ELEVATED TROPONIN:  Non specific in this situation. No accompanying symptoms or signs of ACS.   No further work up.    ESRD:  Per renal.  Undergoing dialysis.    CHRONIC DIASTOLIC HF:  Mild edema on CXR.  Volume management per dialysis.    HTN:  BP is mildly elevated.  No change in therapy.    ATRIAL FLUTTER:  He has been on Eliquis. (Being held for OR for dialysis access)  He appears to be maintaining NSR.   CHB/PACER: Normal function on recent office follow up.    I reviewed recent EP records  [place] will sign off.   Medication Recommendations:  No change  Other recommendations (labs, testing, etc):  None Follow up as an outpatient:   As previously scheduled.     SignedMinus Breeding 07/29/2019, 11:53 AM

## 2019-07-29 NOTE — Progress Notes (Signed)
No new urine noted since 0301 when I took over as pt's Therapist, sports. Condom cath tubing clamped to assisst w/ fresh urine. Therefore urine not collected during my shift.

## 2019-07-29 NOTE — Progress Notes (Signed)
Palliative Medicine Inpatient Follow Up Note   I met with [name] (spouse), [name] (daughter), and we phoned in [place] (daughter) this afternoon to discuss [place]. I shared with them my concerns in the setting of his worsening mental state and his frailty. We discussed his medical conditions inclusive of his dementia. We reviewed the trajectory of vascular type dementias and often the poor outcomes our patients who have dementia suffer after prolonged hospitalizations with associated delirium. I shared with them that the risk of mortality in patients who have a complicated delirium during hospitalization with an underlying history of dementia. Both [name] and [place] asked why he is so delirious. I shared with them that infection, uremia, change in environment, disruption of circadian rhythm, medications, amongst an array of other reasons could contribute to his delirium picture.   We discussed patients ESRD and the complications associated with gaining access. We talked about the benefits and risks of a tunneled catheter. Danielle had shared that she thought about this procedure since she had spoken to Dr. Carlis Abbott and stated that she did not want the tunnel catheter placement.  She said that the risk of infection and complications are too great to consider it. We talked quite a bit about quality of life. I asked if [name] and [name] felt that this is the quality of life the patient would want for himself. We talked about the complications of ongoing HD treatments and how often for patients it becomes more burdensome for them than beneficial. Discussed what like would look like in the event the dialysis were stopped which would include limited time of likely less than two weeks. During this time the goal would be to focus on comfort and symptom relief at a residential hospice.   We discussed Collyn's code status. I again described the trauma that his body would undergo in a code and how that would not be of  greater benefit to the patient. [name] and [name] remain adamant that when Wali was more clear minded he said that he wanted everything done and they would like to respect that. They want to keep him Full Code for the time being despite knowing the various risks associated with it.  Per his daughter, [name] the patient has been eating and getting around fairly well at home until recently. Though is did appear that as we got deeper into our conversation it was established that the patient has had some issues at home inclusive of the patient hiding his medications and not taking them. His wife shares that she found some of his medicine in a drawer.   In the hospital the patient has been eating very little and the nursing staff have had a difficult time getting medications in him. We discussed this. I shared with the family that failure to thrive is common in dementia and something that we can often see in later stages.   From a social perspective the patients wife has some significant health ailments limiting her mobility. She shares with me that she would not be able to care for [place] when he gets home if he remains in the state he is now. She said that she has tried the best she can for as long as she can. [name] has a job in [place] and has been able to work from home up until recently when she was told that she needs to come back in physically to the office a few times a month. During these times Zymier and his wife  are alone. 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 I told her that I would reach out to social work to see if he was service connected and if so what CG services could be offered.   I again summarized that Angelica has a complicated delirium and that continuing HD will not resolve his underlying dementia. They are hopeful that the patient is able to improve with some additional HD treatments and getting back to a familiar environment  which would be home. They realize that he may not improve in which case they would decide to stop HD and focus on comfort.   The family had asked if both their primary nephrologist, Dr. Justin Mend and the medical physican, [name] could reach out to them. They shared that they are interested to hear their perspective as well.  SUMMARY OF RECOMMENDATIONS Full Code / Full Scope for now  Continue HD   Have asked for Dr. Justin Mend and [name] to reach out to the family  Chaplain support  Ongoing [place] conversations  Start Time: 1300 End Time: 14:30 Time Spent: 90 minutes Greater than 50% of the time was spent in counseling and coordination of care ______________________________________________________________________________________ [place] Team Team Cell Phone: [phone] Please utilize secure chat with additional questions, if there is no response within 30 minutes please call the above phone number  Palliative Medicine Team providers are available by phone from 7am to 7pm daily and can be reached through the team cell phone.  Should this patient require assistance outside of these hours, please call the patient's attending physician.

## 2019-07-30 ENCOUNTER — Inpatient Hospital Stay (HOSPITAL_COMMUNITY): Payer: Medicare Other

## 2019-07-30 ENCOUNTER — Encounter (HOSPITAL_COMMUNITY)
Admission: EM | Disposition: A | Discharge status: Home Health | Payer: Self-pay | Source: Home / Self Care | Attending: Internal Medicine

## 2019-07-30 DIAGNOSIS — Z515 Encounter for palliative care: Secondary | ICD-10-CM

## 2019-07-30 DIAGNOSIS — N186 End stage renal disease: Secondary | ICD-10-CM

## 2019-07-30 DIAGNOSIS — R410 Disorientation, unspecified: Secondary | ICD-10-CM

## 2019-07-30 LAB — CBC
HCT: 32.9 % — ABNORMAL LOW (ref 39.0–52.0)
Hemoglobin: 10.4 g/dL — ABNORMAL LOW (ref 13.0–17.0)
MCH: 30.6 pg (ref 26.0–34.0)
MCHC: 31.6 g/dL (ref 30.0–36.0)
MCV: 96.8 fL (ref 80.0–100.0)
Platelets: 197 10*3/uL (ref 150–400)
RBC: 3.4 MIL/uL — ABNORMAL LOW (ref 4.22–5.81)
RDW: 14.7 % (ref 11.5–15.5)
WBC: 11.3 10*3/uL — ABNORMAL HIGH (ref 4.0–10.5)
nRBC: 0 % (ref 0.0–0.2)

## 2019-07-30 LAB — BASIC METABOLIC PANEL
Anion gap: 16 — ABNORMAL HIGH (ref 5–15)
BUN: 68 mg/dL — ABNORMAL HIGH (ref 8–23)
CO2: 24 mmol/L (ref 22–32)
Calcium: 10.6 mg/dL — ABNORMAL HIGH (ref 8.9–10.3)
Chloride: 103 mmol/L (ref 98–111)
Creatinine, Ser: 5.6 mg/dL — ABNORMAL HIGH (ref 0.61–1.24)
GFR calc Af Amer: 10 mL/min — ABNORMAL LOW (ref >60)
GFR calc non Af Amer: 9 mL/min — ABNORMAL LOW (ref >60)
Glucose, Bld: 148 mg/dL — ABNORMAL HIGH (ref 70–99)
Potassium: 4.3 mmol/L (ref 3.5–5.1)
Sodium: 143 mmol/L (ref 135–145)

## 2019-07-30 LAB — PROCALCITONIN: Procalcitonin: 3.02 ng/mL

## 2019-07-30 SURGERY — INSERTION OF DIALYSIS CATHETER
Anesthesia: General

## 2019-07-30 MED ORDER — DEXTROSE 5 % IV SOLN: INTRAVENOUS | Status: DC

## 2019-07-30 MED ORDER — CHLORHEXIDINE GLUCONATE CLOTH 2 % EX PADS
6.0000 | MEDICATED_PAD | Freq: Every day | CUTANEOUS | Status: DC
  Administered 2019-07-31 – 2019-08-02 (×3): 6 via TOPICAL

## 2019-07-30 NOTE — Progress Notes (Signed)
Daily Progress Note   Patient Name: Jeffrey Campbell       Date: 07/30/2019 DOB: 09/27/1932  Age: 86 y.o. MRN#: [id number] Attending Physician: Kayleen Memos, DO Primary Care Physician: [name], MD Admit Date: 07/25/2019  Reason for Consultation/Follow-up: Establishing goals of care and Psychosocial/spiritual support    Patient eating 25% or so of meals.  Regular diet.  Thin liquids. Patient able to take small steps and walk around the room with PT. Albumin 3.7 Tolerates hemodialysis. Currently being treated for PNA.    Subjective: Patient calm and sleeping.  Able to give 1-2 word answers.  Are you comfortable?  Do you want a blanket?  Are you hungry?  Spoke with RN.   Then called Danielle.  She has spoken with Drs. Justin Mend and Doctor, hospital.  She feels he would be less likely to have delirium at home and would like for him to be discharged soon (tomorrow).  [name] shares that there is only one woman at the hemodialysis center that can access his fistula and she works on M/W/F.  If patient has dialysis T/T/S there is no one there to access the fistula.  I offered Palliative services to visit Mr. Brucato at the house.  [name] feels they are well connected with the patient's nephrologist and PCP.  I explained if at anytime he stopped eating or could no longer tolerate HD either Dr. Justin Mend or Dr. Drema Dallas could activate Hospice services to support her father and the family.   Assessment: Cachectically thin, fragile, pleasantly demented gentleman lying in NAD.  Currently with pneumonia on Vanc and meropenem.   Eating 25 % of meals but able to ambulate.   Length of Stay: 5   Vital Signs: BP 121/61 (BP Location: Right Arm)   Pulse 72   Temp 98.4 F (36.9 C) (Oral)   Resp 18   Ht 5\' 8"   (1.727 m)   Wt 49 kg   SpO2 96%   BMI 16.42 kg/m  SpO2: SpO2: 96 % O2 Device: O2 Device: Room Air O2 Flow Rate:         Palliative Assessment/Data: 40%     Palliative Care Plan    Recommendations/Plan:  Plan per primary team.  Family understands that when they are ready for hospice they can activate it thru their PCP or  Nephrologist.  They politely decline Palliative outpatient.  Anticipate dc to home on 5/4 after HD at family 's request.  Code Status:  Full code  Prognosis:  Patient with progressive dementia, aflutter, bradycardia, HFpEF, renal function declined to the point of needing HD within the last several months.  I'm concerned he will continue on the same trajectory and that he is unlikely to be with [place] in 6 - 12 mos.  He is at high risk for delirium or recurrent PNA and rehospitalization.  Discharge Planning:  Home with [place] was discussed with RN, Daughter  Thank you for allowing the Palliative Medicine Team to assist in the care of this patient.  Total time spent:  35 min.     Greater than 50%  of this time was spent counseling and coordinating care related to the above assessment and plan.  Florentina Jenny, PA-C Palliative Medicine  Please contact Palliative MedicineTeam phone at [phone] for questions and concerns between 7 am - 7 pm.   Please see AMION for individual provider pager numbers.

## 2019-07-30 NOTE — Progress Notes (Signed)
Spoke with the patient's daughter Jeffrey Campbell.  She wants to bring her father home today.  Explained to her that he is being treated for likely PNA with possible aspiration, pending speech's evaluation.  Also informed her that he has HD planned tomorrow.  Would not be released today and this would be leaving against medical advice.  She stated she would allow [place] to treat him today, will come to the hospital tomorrow to bring Jeffrey Campbell home.  Offered to call her tomorrow to give her an update of his medical condition.   She agrees.

## 2019-07-30 NOTE — Progress Notes (Signed)
[facility])  Referral received for community based palliative care once discharged.  ACC will reach out to family and plan to see him when he is discharged.  Venia Carbon RN, BSN, [facility] Liaison

## 2019-07-30 NOTE — Progress Notes (Signed)
The chaplain spoke with the patient's nurse as no family was present, The nurse informed the chaplain that the patient is confused at baseline. The chaplain will attempt to follow-up when family is available.  Brion Aliment Chaplain Resident For questions concerning this note please contact me by pager [phone]

## 2019-07-30 NOTE — Progress Notes (Addendum)
PROGRESS NOTE  WITT PLITT NAT:[id number] DOB: 12-Oct-1932 DOA: 07/25/2019 PCP: [name], MD  HPI/Recap of past 24 hours: Jeffrey Campbell is a 86 y.o. male with medical history significant of complete heart block status post pacemaker, atrial flutter-on Eliquis, dementia, obstructive sleep apnea-not on CPAP, chronic diastolic CHF, pulmonary hypertension, coronary artery disease, ESRD on dialysis (TTS), anemia of chronic disease, hypothyroidism, hyperlipidemia, PVD who presented to ED with altered mental status.  History is provided by patient's daughter over the phone.  Patient has been more confused for the last 2 days.  He has not slept in last 2 days.  His confusion is getting worse possibly hallucinating.  He tried to use the bathroom in the living room last night.  Not sure if he took his medicines last night or this morning.  He fell out of his bed the day prior to presentation.  Prior to coming to the ED he tried to leave the house half dressed.  His daughter called EMS and brought patient to ER for further evaluation and management.  He recently started hemodialysis.  He was dialyzed successfully on Friday and Monday however they attempted to dialyze him on Tuesday but could not dialyze due to difficulty with vein access.  His next scheduled session is on Thursday 07/26/19.  Patient lives with his wife at home.   ED Course: COVID-19 negative, negative UA.  Elevated troponin.   CT head negative for acute findings.  Chest x-ray shows: Cardiomegaly with central vascular engorgement with possible signs of interstitial edema.    TRH asked to admit for acute metabolic encephalopathy.    Hospital course complicated by delirium for which we had to apply soft restraints.  He was seen by cardiology due to uptrending troponin.  Thought to be nonspecific with no further work-up planned, cardiology signed off.  His altered mental status likely not related to uremia as he has been  hemodialyzed.  Seen by palliative care to establish goals of care.  07/30/19: Seen and examined.  More alert today and answering more questions appropriately.  Rales noted on auscultation, will order chest x-ray to further assess.  Speech therapy has been consulted for swallow evaluation to rule out dysphagia.   Assessment/Plan: Principal Problem:   Acute metabolic encephalopathy Active Problems:   OSA (obstructive sleep apnea)   Hypothyroidism   Anemia of chronic disease   Hypertension   ESRD (end stage renal disease) (HCC)   Elevated troponin   Acute metabolic encephalopathy in the setting of advanced dementia likely multifactorial secondary to hospital acquired delirium, acute illness, worsening dementia CT head non acute.  UA negative.  UDS negative.  TSH normal.  Ammonia level normal Continue to reorient as needed  Leukocytosis, unclear source Blood cultures drawn peripherally negative to date Continue to follow cultures Procalcitonin trending up Repeat chest x-ray Diffuse rales noted on exam We will obtain speech evaluation to rule out dysphagia. Continue IV vancomycin and Zosyn for now  Elevated troponin Up trended from  132 to 155 Seen by cardiology.  Thought to be nonspecific with no further work-up planned, signed off.  QTC prolongation QTC >545 Avoid QTC prolonging agents  Severe pulmonary hypertension RSVP 72 as seen on 2 D echo done on 01/05/19 Seen by cardiology, made no changes.  Hypothyroidism TSH normal Continue levothyroxine  ESRD on HD TTS Management per nephrology  History of complete heart block post pacemaker placement Continue to monitor  Elevated T bili Unclear etiology Possibly [name] syndrome  CdCHF Fluid addressed by HD Continue cardiac meds  PVD/HLD Continue statin  Dementia C/w home meds  Essential HTN BP stable for HD C/w home meds  OSA Does not use CPAP at home  Physical debility PT recommended home health PT  with 24-hour supervision assistance Continue PT OT with assistance and fall precautions  Goals of care Palliative care team following and assisting with establishing goals of care Full code Poor functional status, severe physical debility, with worsening dementia   DVT prophylaxis: Eliquis/TED/SCD  Code Status: Full code-confirmed with the patient daughter  Family Communication:  Updated his daughter [name].  Disposition Plan: From home.  Anticipate dc to Home with 24H assistance/supervision. Barrier to dc leukocytosis with ongoing work up and IV abx.   Consults called: Nephrology, vascular surgery, cardiology     Objective: Vitals:   07/29/19 2351 07/30/19 0330 07/30/19 0414 07/30/19 0813  BP: 135/63  135/70 133/66  Pulse: 67  67 71  Resp: 17  17 18   Temp: 97.6 F (36.4 C)  98.1 F (36.7 C) 97.8 F (36.6 C)  TempSrc: Oral  Oral Axillary  SpO2:    100%  Weight:  49 kg    Height:        Intake/Output Summary (Last 24 hours) at 07/30/2019 1002 Last data filed at 07/30/2019 0900 Gross per 24 hour  Intake 770 ml  Output --  Net 770 ml   Filed Weights   07/28/19 1720 07/29/19 0509 07/30/19 0330  Weight: 49 kg 47.6 kg 49 kg    Exam:  . General: 86 y.o. year-old male frail-appearing in no acute distress.  Alert and interactive in the setting of dementia. . Cardiovascular: Regular rate and rhythm no rubs or gallops.  Pacemaker in place. Marland Kitchen  Respiratory: Diffuse rales bilaterally no wheezing noted.   . Abdomen: Soft nontender normal bowel sounds present.   . Musculoskeletal: No lower extremity edema bilaterally.   Marland Kitchen Psychiatry: Mood is appropriate for condition and setting.   Data Reviewed: CBC: Recent Labs  Lab 07/25/19 1117 07/25/19 1125 07/26/19 0254 07/27/19 0654 07/28/19 9381 07/29/19 0743 07/30/19 0317  WBC 9.2   < > 9.9 13.4* 12.9* 13.4* 11.3*  NEUTROABS 8.1*  --   --   --  11.5*  --   --   HGB 9.1*   < > 9.2* 9.3* 9.5* 11.2* 10.4*  HCT 29.7*   < >  29.9* 29.9* 30.3* 35.7* 32.9*  MCV 97.7   < > 99.0 98.0 97.1 95.5 96.8  PLT 164   < > 173 170 158 204 197   < > = values in this interval not displayed.   Basic Metabolic Panel: Recent Labs  Lab 07/25/19 1125 07/25/19 2321 07/26/19 0254 07/27/19 0654 07/28/19 8299 07/29/19 0743 07/30/19 0317  NA   < >  --  144 138 140 142 143  K   < >  --  3.7 3.9 3.7 4.2 4.3  CL   < >  --  106 102 103 100 103  CO2   < >  --  26 28 26 26 24   GLUCOSE   < >  --  128* 143* 116* 143* 148*  BUN   < >  --  58* 35* 50* 40* 68*  CREATININE   < >  --  5.64* 3.96* 4.65* 4.07* 5.60*  CALCIUM   < >  --  10.5* 9.6 10.6* 10.8* 10.6*  MG  --  2.4  --   --   --  2.1  --   PHOS  --  5.2*  --   --  3.6  --   --    < > = values in this interval not displayed.   GFR: Estimated Creatinine Clearance: 6.8 mL/min (A) (by C-G formula based on SCr of 5.6 mg/dL (H)). Liver Function Tests: Recent Labs  Lab 07/25/19 1117 07/26/19 0254 07/27/19 0654 07/28/19 0623  AST 32 33 27 31  ALT 13 14 12 13   ALKPHOS 61 60 64 70  BILITOT 1.1 1.4* 1.3* 1.4*  PROT 8.1 7.2 7.1 7.2  ALBUMIN 4.2 3.8 3.5 3.7   No results for input(s): LIPASE, AMYLASE in the last 168 hours. Recent Labs  Lab 07/25/19 1602  AMMONIA 24   Coagulation Profile: No results for input(s): INR, PROTIME in the last 168 hours. Cardiac Enzymes: No results for input(s): CKTOTAL, CKMB, CKMBINDEX, TROPONINI in the last 168 hours. BNP (last 3 results) Recent Labs    01/09/19 1019  PROBNP 21,225*   HbA1C: No results for input(s): HGBA1C in the last 72 hours. CBG: Recent Labs  Lab 07/25/19 1039 07/26/19 0137 07/28/19 1708  GLUCAP 130* 114* 130*   Lipid Profile: No results for input(s): CHOL, HDL, LDLCALC, TRIG, CHOLHDL, LDLDIRECT in the last 72 hours. Thyroid Function Tests: No results for input(s): TSH, T4TOTAL, FREET4, T3FREE, THYROIDAB in the last 72 hours. Anemia Panel: No results for input(s): VITAMINB12, FOLATE, FERRITIN, TIBC, IRON,  RETICCTPCT in the last 72 hours. Urine analysis:    Component Value Date/Time   COLORURINE YELLOW 07/25/2019 1116   APPEARANCEUR HAZY (A) 07/25/2019 1116   LABSPEC 1.019 07/25/2019 1116   PHURINE 5.0 07/25/2019 1116   GLUCOSEU NEGATIVE 07/25/2019 1116   [place] 07/25/2019 1116   [place] 07/25/2019 1116   KETONESUR NEGATIVE 07/25/2019 1116   PROTEINUR 100 (A) 07/25/2019 1116   NITRITE NEGATIVE 07/25/2019 1116   LEUKOCYTESUR NEGATIVE 07/25/2019 1116   Sepsis Labs: @LABRCNTIP (procalcitonin:4,lacticidven:4)  ) Recent Results (from the past 240 hour(s))  Respiratory Panel by RT PCR (Flu A&B, Covid) - Nasopharyngeal Swab     Status: None   Collection Time: 07/25/19 12:36 PM   Specimen: Nasopharyngeal Swab  Result Value Ref Range Status   SARS Coronavirus 2 by RT PCR NEGATIVE NEGATIVE Final    Comment: (NOTE) SARS-CoV-2 target nucleic acids are NOT DETECTED. The SARS-CoV-2 RNA is generally detectable in upper respiratoy specimens during the acute phase of infection. The lowest concentration of SARS-CoV-2 viral copies this assay can detect is 131 copies/mL. A negative result does not preclude SARS-Cov-2 infection and should not be used as the sole basis for treatment or other patient management decisions. A negative result may occur with  improper specimen collection/handling, submission of specimen other than nasopharyngeal swab, presence of viral mutation(s) within the areas targeted by this assay, and inadequate number of viral copies (<131 copies/mL). A negative result must be combined with clinical observations, patient history, and epidemiological information. The expected result is Negative. Fact Sheet for Patients:  PinkCheek.be Fact Sheet for Healthcare Providers:  GravelBags.it This test is not yet ap proved or cleared by the [place] FDA and  has been authorized for detection and/or  diagnosis of SARS-CoV-2 by FDA under an Emergency Use Authorization (EUA). This EUA will remain  in effect (meaning this test can be used) for the duration of the COVID-19 declaration under Section 564(b)(1) of the Act, 21 U.S.C. section 360bbb-3(b)(1), unless the authorization is terminated or revoked sooner.    Influenza  A by PCR NEGATIVE NEGATIVE Final   Influenza B by PCR NEGATIVE NEGATIVE Final    Comment: (NOTE) The Xpert Xpress SARS-CoV-2/FLU/RSV assay is intended as an aid in  the diagnosis of influenza from Nasopharyngeal swab specimens and  should not be used as a sole basis for treatment. Nasal washings and  aspirates are unacceptable for Xpert Xpress SARS-CoV-2/FLU/RSV  testing. Fact Sheet for Patients: PinkCheek.be Fact Sheet for Healthcare Providers: GravelBags.it This test is not yet approved or cleared by the [place] FDA and  has been authorized for detection and/or diagnosis of SARS-CoV-2 by  FDA under an Emergency Use Authorization (EUA). This EUA will remain  in effect (meaning this test can be used) for the duration of the  Covid-19 declaration under Section 564(b)(1) of the Act, 21  U.S.C. section 360bbb-3(b)(1), unless the authorization is  terminated or revoked. Performed at [facility], [place] [address], [place], [place] [address]   MRSA PCR Screening     Status: None   Collection Time: 07/25/19 10:06 PM   Specimen: Nasal Mucosa; Nasopharyngeal  Result Value Ref Range Status   MRSA by PCR NEGATIVE NEGATIVE Final    Comment:        The GeneXpert MRSA Assay (FDA approved for NASAL specimens only), is one component of a comprehensive MRSA colonization surveillance program. It is not intended to diagnose MRSA infection nor to guide or monitor treatment for MRSA infections. Performed at [facility], [place] [address], [place], [place] [address]   Culture, blood (routine x 2)      Status: None (Preliminary result)   Collection Time: 07/29/19  7:42 AM   Specimen: BLOOD RIGHT ARM  Result Value Ref Range Status   Specimen Description BLOOD RIGHT ARM  Final   Special Requests   Final    BOTTLES DRAWN AEROBIC AND ANAEROBIC Blood Culture adequate volume   Culture   Final    NO GROWTH 1 DAY Performed at [facility], [place] [address], [place], [place] [address]    Report Status PENDING  Incomplete  Culture, blood (routine x 2)     Status: None (Preliminary result)   Collection Time: 07/29/19  7:43 AM   Specimen: BLOOD RIGHT HAND  Result Value Ref Range Status   Specimen Description BLOOD RIGHT HAND  Final   Special Requests   Final    BOTTLES DRAWN AEROBIC AND ANAEROBIC Blood Culture adequate volume   Culture   Final    NO GROWTH 1 DAY Performed at [facility], [place] [address], [place], [place] [address]    Report Status PENDING  Incomplete      Studies: No results found.  Scheduled Meds: . amLODipine  10 mg Oral Daily  . apixaban  2.5 mg Oral BID  . atorvastatin  20 mg Oral Daily  . Chlorhexidine Gluconate Cloth  6 each Topical Q0600  . donepezil  10 mg Oral QHS  . doxazosin  4 mg Oral Daily  . hydrALAZINE  100 mg Oral TID  . levothyroxine  150 mcg Oral QAC breakfast  . memantine  10 mg Oral BID  . [START ON 07/31/2019] vancomycin  500 mg Intravenous Q T,Th,Sa-HD    Continuous Infusions: . piperacillin-tazobactam (ZOSYN)  IV 2.25 g (07/30/19 0635)     LOS: 5 days     Kayleen Memos, MD [facility] Pager [phone]  If 7PM-7AM, please contact night-coverage [email or web address] Password [email or web address] 07/30/2019, 10:02 AM

## 2019-07-30 NOTE — Progress Notes (Signed)
Physical Therapy Treatment Patient Details Name: Jeffrey Campbell MRN: [id number] DOB: [birth year] Today's Date: 07/30/2019    History of Present Illness 86 yo admitted with increased confusion and fall at home with additional fall am of 4/29. Pt with encephalopathy and euvolemic. PMHx: dementia, complete heart block s/p PPM, pulm HTN, OSA, dementia, Graves dz, glaucoma, CAD, ESRD (TTS), PVD, BPH, Aflutter    PT Comments    Patient in bed upon arrival and agreeable to participate in therapy. Pt c/o back pain and RN gave pain medication during session. Pt requires min guard-min A for OOB mobility using RW. Pt incontinent of bowel and transferred to [facility]. Pt able to stand for peri-care and then requested return to supine due to back pain. Pt comfortable end of session. Current plan remains appropriate.     Follow Up Recommendations  Home health PT;Supervision/Assistance - 24 hour     Equipment Recommendations  Rolling walker with 5" wheels    Recommendations for Other Services       Precautions / Restrictions Precautions Precautions: Fall Precaution Comments: incontinent Restrictions Weight Bearing Restrictions: No    Mobility  Bed Mobility Overal bed mobility: Needs Assistance Bed Mobility: Supine to Sit;Sit to Supine     Supine to sit: Min guard Sit to supine: Min guard   General bed mobility comments: use of rail; increased time and effort; min guard for safety  Transfers Overall transfer level: Needs assistance Equipment used: Rolling walker (2 wheeled) Transfers: Sit to/from [facility] Sit to Stand: Min assist Stand pivot transfers: Min assist       General transfer comment: assist to power up, to steady, and to manage RW when turning; cues for sequencing and upright posture; pt no used to using RW at home  Ambulation/Gait             General Gait Details: after standing for pericare pt requested to lie back down due to back pain so further  mobility deferred    Stairs             Wheelchair Mobility    Modified Rankin (Stroke Patients Only)       Balance Overall balance assessment: Needs assistance;History of Falls Sitting-balance support: Feet supported Sitting balance-Leahy Scale: Fair     Standing balance support: Bilateral upper extremity supported;During functional activity Standing balance-Leahy Scale: Poor Standing balance comment: pt stood with min guard-min A  for pericare                            Cognition Arousal/Alertness: Awake/alert Behavior During Therapy: Flat affect Overall Cognitive Status: History of cognitive impairments - at baseline                                 General Comments: history of dementia at baseline. able to state he is at [place] and reports it is 2020; incontinent of bowel and no awareness to need for assistance however did use call bell to say his back is hurting      Exercises      General Comments        Pertinent Vitals/Pain Pain Assessment: Faces Faces Pain Scale: Hurts little more Pain Location: back Pain Descriptors / Indicators: Guarding;Grimacing;Sore Pain Intervention(s): Limited activity within patient's tolerance;Monitored during session;Repositioned;Patient requesting pain meds-RN notified;RN gave pain meds during session    Home Living  Prior Function            PT Goals (current goals can now be found in the care plan section) Progress towards PT goals: Progressing toward goals    Frequency    Min 3X/week      PT Plan Current plan remains appropriate    Co-evaluation              AM-PAC PT "6 Clicks" Mobility   Outcome Measure  Help needed turning from your back to your side while in a flat bed without using bedrails?: A Little Help needed moving from lying on your back to sitting on the side of a flat bed without using bedrails?: A Little Help needed  moving to and from a bed to a chair (including a wheelchair)?: A Little Help needed standing up from a chair using your arms (e.g., wheelchair or bedside chair)?: A Little Help needed to walk in hospital room?: A Little Help needed climbing 3-5 steps with a railing? : A Little 6 Click Score: 18    End of Session Equipment Utilized During Treatment: Gait belt Activity Tolerance: Patient tolerated treatment well Patient left: with call bell/phone within reach;in bed;with bed alarm set;with restraints reapplied;Other (comment)(bilat mittens; bed in lowest position and mats in place) Nurse Communication: Mobility status PT Visit Diagnosis: Difficulty in walking, not elsewhere classified (R26.2);Other abnormalities of gait and mobility (R26.89);History of falling (Z91.81);Muscle weakness (generalized) (M62.81)     Time: 1117-3567 PT Time Calculation (min) (ACUTE ONLY): 50 min  Charges:  $Gait Training: 23-37 mins                     Earney Navy, PTA Acute Rehabilitation Services Pager: [phone] Office: [phone]     Darliss Cheney 07/30/2019, 12:03 PM

## 2019-07-30 NOTE — TOC Progression Note (Signed)
Transition of Care [facility]) - Progression Note    Patient Details  Name: Jeffrey Campbell MRN: [id number] Date of Birth: [birth year]  Transition of Care [facility]) CM/SW Contact  Pollie Friar, RN Phone Number: 07/30/2019, 12:28 PM  Clinical Narrative:    CM consulted for assisting the family with VA benefits. CM spoke to patients wife last week and went over that pt was not active with the [place] and that he would have to get an appt with a PCP through them to get any kind of services.  CM called the [place] and pt is not services connected and not connected to a [place] hospital/ clinic. CM obtained the number for eligibility.  CM reached out to the patients wife and spoke to the daughter. She was provided the number for VA eligibility, which she will have to call and get him set up at the [place] with a PCP. She then can do a telehealth visit with the [place]. After this they can determine what he will qualify for through the [place]. Daughter made aware of all of this. CM inquired about palliative care services at the home. Daughter in agreement with [facility]. Referral provided to [facility] with [facility].  Daughter then became upset and talked about how she wanted her father to d/c home today. She stated that she could come and get him at anytime from the hospital. CM voiced that Dr Nevada Crane would be the MD to d/c her father and that she should speak to her about her concerns and wishes for him to come home. CM updated Dr Nevada Crane.  TOC following for further d/c needs.     Expected Discharge Plan: [place] Barriers to Discharge: Continued Medical Work up  Expected Discharge Plan and Services Expected Discharge Plan: [place]   Discharge Planning Services: CM Consult Post Acute Care Choice: Home Health, Durable Medical Equipment Living arrangements for the past 2 months: Single Family Home                 DME Arranged: Walker rolling DME Agency: [employer] Date DME Agency Contacted:  07/27/19   Representative spoke with at DME Agency: [facility] Arranged: PT, OT, RN, Nurse's Aide, Social Work [employer] Agency: [place] ([place]) Date [place]: 07/27/19   Representative spoke with at [place]: [place] ([place]) Interventions    Readmission Risk Interventions No flowsheet data found.

## 2019-07-30 NOTE — Progress Notes (Signed)
Discussion  with daughter [name] at the request of Dr Posey Pronto  Reviewed treatment plan.  Ongoing delirium  Being treated for infection. Blood cultures are negative - 1 day   Vital signs are stable and labs look reasonable   Noted increase wbc  ( this is improving)  TSH appears to be in range  And head CT unremarkable for acute bleed.   Fistula appears to be functioning    [name] feels that the goal would be to take him home and continue outpatient dialysis. She seems pleased with his care and outpatient dialysis treatments

## 2019-07-30 NOTE — Progress Notes (Addendum)
Patient ID: Jeffrey Campbell, male   DOB: March 16, 1933, 86 y.o.   MRN: [id number]  [facility] Progress Note    Subjective:   Confused/disoriented overnight this am, lying flat in room, difficult to arouse, Opens eyes and grunts responses but nothing intelligible.    Objective:   BP 133/66 (BP Location: Right Arm)   Pulse 71   Temp 97.8 F (36.6 C) (Axillary)   Resp 18   Ht 5\' 8"  (1.727 m)   Wt 49 kg   SpO2 100%   BMI 16.42 kg/m   OP HD:  TTS [facility], has only had 2 sessions so far (4/23, 4/26) and was supposed to switch to MWF this Monday 5/3   2.5 hr  BFR 250 2K/2Ca   LUA  AVF   Hep none  - time/BFR being advanced as new patient   - No venofer or ESA yet, Hgb there 11.1 - Calcitriol 0.58mcg PO q HD     Physical Exam: Gen: sleeping, difficult to wake up, did not get verbal response today CVS: Pulse regular rhythm, normal rate, 2/6 ejection systolic murmur Resp: Clear to location bilaterally, rales L base Abd: Soft, flat, nontender, bowel sounds normal Ext: No lower extremity edema, left brachiocephalic fistula is pulsatile with some ecchymosis from recent infiltration Neuro: pt in mittens, confused and minimally verbal  Assessment/ Plan:   1.  Altered mental status in patient with underlying dementia: pt remains altered and confused and non-cooperative this am. No indication of uremia given regular HD.   Per Dr Jason Nest note, goal per family is to get him back home and to OP HD. Hopefully MS will improve w/ Rx of infection. I put him on D5W at 50/hr to avoid dehydration until MS is better.   2. ^WBC/ LLL infiltrate - poss PNA, was started on IV abx yesterday. Per primary team.    3. ESRD: recent start (OP HD x 2). Has had HD x 2 here. Plan HD tomorrow then go to MWF schedule as per OP unit.  4. Anemia: With marginally low hemoglobin and hematocrit, monitor on ESA. 5. CKD-MBD: Hypercalcemia noted, continue low calcium dialysate.  Phosphorus at goal. 6. Nutrition:  Continue renal diet with ongoing nutritional supplementation-erratic intake. 7. Hypertension: Blood pressures within acceptable range with ongoing medication/HD  Kelly Splinter, MD 07/30/2019, 12:00 PM  Labs: BMET Recent Labs  Lab 07/25/19 1117 07/25/19 1125 07/25/19 2321 07/26/19 0254 07/27/19 0654 07/28/19 4098 07/29/19 0743 07/30/19 0317  NA 143 144  --  144 138 140 142 143  K 4.1 4.0  --  3.7 3.9 3.7 4.2 4.3  CL 103 102  --  106 102 103 100 103  CO2 25  --   --  26 28 26 26 24   GLUCOSE 124* 120*  --  128* 143* 116* 143* 148*  BUN 52* 48*  --  58* 35* 50* 40* 68*  CREATININE 5.54* 5.70*  --  5.64* 3.96* 4.65* 4.07* 5.60*  CALCIUM 11.1*  --   --  10.5* 9.6 10.6* 10.8* 10.6*  PHOS  --   --  5.2*  --   --  3.6  --   --    CBC Recent Labs  Lab 07/25/19 1117 07/25/19 1125 07/27/19 0654 07/28/19 0623 07/29/19 0743 07/30/19 0317  WBC 9.2   < > 13.4* 12.9* 13.4* 11.3*  NEUTROABS 8.1*  --   --  11.5*  --   --   HGB 9.1*   < >  9.3* 9.5* 11.2* 10.4*  HCT 29.7*   < > 29.9* 30.3* 35.7* 32.9*  MCV 97.7   < > 98.0 97.1 95.5 96.8  PLT 164   < > 170 158 204 197   < > = values in this interval not displayed.     Medications:    . amLODipine  10 mg Oral Daily  . apixaban  2.5 mg Oral BID  . atorvastatin  20 mg Oral Daily  . Chlorhexidine Gluconate Cloth  6 each Topical Q0600  . donepezil  10 mg Oral QHS  . doxazosin  4 mg Oral Daily  . hydrALAZINE  100 mg Oral TID  . levothyroxine  150 mcg Oral QAC breakfast  . memantine  10 mg Oral BID  . [START ON 07/31/2019] vancomycin  500 mg Intravenous Q T,Th,Sa-HD

## 2019-07-30 NOTE — Evaluation (Signed)
Clinical/Bedside Swallow Evaluation Patient Details  Name: Jeffrey Campbell MRN: [id number] Date of Birth: 1932-11-16  Today's Date: 07/30/2019 Time: SLP Start Time (ACUTE ONLY): 41 SLP Stop Time (ACUTE ONLY): 1600 SLP Time Calculation (min) (ACUTE ONLY): 10 min  Past Medical History:  Past Medical History:  Diagnosis Date  . Anemia    low iron  . BPH (benign prostatic hyperplasia)   . CAD (coronary artery disease)   . Carotid artery stenosis    1-39% bilateral carotid artery stenosis and < 50% stenosis in the right CCA by dopplers 06/2017  . CKD (chronic kidney disease), stage III    Stage 4  . Diastolic dysfunction   . Glaucoma   . Graves disease   . History of ETOH abuse   . Hyperlipemia   . Hypertension   . Hyperthyroidism 08/26/10   radioactive iodine therapy   . Memory loss   . Mitral regurgitation echo 2015   mild  . Multiple thyroid nodules   . MVP (mitral valve prolapse) 11/2012   posterior MVP  . OSA (obstructive sleep apnea)    upper airway resistance syndrome with RDI 18/hr - not on CPAP due to insurance not covering  . Pre-diabetes   . PUD (peptic ulcer disease)   . Pulmonary hypertension ([place]) echo 2015   Group 2 with pulmonary venous HTN and Group 3 with OSA  . Upper airway resistance syndrome    Past Surgical History:  Past Surgical History:  Procedure Laterality Date  . APPENDECTOMY    . AV FISTULA PLACEMENT Left 10/18/2017   Procedure: ARTERIOVENOUS (AV) FISTULA CREATION ARM;  Surgeon: Waynetta Sandy, MD;  Location: [facility];  Service: Vascular;  Laterality: Left;  . CARDIAC CATHETERIZATION    . CARDIAC CATHETERIZATION N/A 02/17/2015   Procedure: Right Heart Cath;  Surgeon: Larey Dresser, MD;  Location: [facility];  Service: Cardiovascular;  Laterality: N/A;  . CARDIOVERSION N/A 02/08/2019   Procedure: CARDIOVERSION;  Surgeon: Donato Heinz, MD;  Location: [facility] ENDOSCOPY;  Service: Endoscopy;  Laterality: N/A;  .  ESOPHAGOGASTRODUODENOSCOPY (EGD) WITH PROPOFOL Left 10/16/2016   Procedure: ESOPHAGOGASTRODUODENOSCOPY (EGD) WITH PROPOFOL;  Surgeon: Ronnette Juniper, MD;  Location: [facility];  Service: Gastroenterology;  Laterality: Left;  . heart catherization    . HERNIA REPAIR  10/2009  . IR RADIOLOGIST EVAL & MGMT  09/28/2016  . IR RADIOLOGIST EVAL & MGMT  10/19/2016  . IR RADIOLOGIST EVAL & MGMT  01/18/2017  . PACEMAKER IMPLANT N/A 02/09/2019   Procedure: PACEMAKER IMPLANT;  Surgeon: Constance Haw, MD;  Location: [place] [facility];  Service: Cardiovascular;  Laterality: N/A;  . RIGHT HEART CATH N/A 07/28/2016   Procedure: Right Heart Cath;  Surgeon: Larey Dresser, MD;  Location: [facility];  Service: Cardiovascular;  Laterality: N/A;   HPI:  Jeffrey Campbell is a 86 y.o. male admitted with AMS with behavioral changes. PMH: dementia, upper airway resistance syndrome, OSA, HTN,  ETOG abuse, multiple thyroid nodules. Per chart family reports pt has been spitting out his meds. clinical swallow eval in 2018 revealing subjectively normal swallow function and reg/thin recommended.  Swallow eval this admission 4/30 with similar findings. CXR 5/3 with Left basilar airspace disease worrisome for pneumonia.  Swallow eval was re-ordered.   Assessment / Plan / Recommendation Clinical Impression  New consult for bedside swallow received. Upon entering room, pt was sleeping and tan-colored material was observed on left side of chin and spilled on gown.  Awakened pt and provided oral suctioning, extracting copious amounts of same material from oral cavity (uncertain if material was food or emesis).  Mouth was cleaned/suctioned.  Pt was willing to sip some water.  Vocal quality good; he followed oral motor commands.  He drank multiple boluses of water with adequate attention, brisk swallow, and no overt s/s of aspiration. He declined further PO trials and I was reluctant to encourage more eating if pt was having N/V.   Discussed with RN.  Recommend continuing currrent diet (regular solids/thin liquids/meds whole in puree); provide set-up and full supervision during meals. Will f/u x1 to ensure there has been no true decline in swallow function given limited re-assessment today.  SLP Visit Diagnosis: Dysphagia, unspecified (R13.10)    Aspiration Risk  Mild aspiration risk    Diet Recommendation   regular solids, thin liquids  Medication Administration: Whole meds with puree    Other  Recommendations Oral Care Recommendations: Oral care BID   Follow up Recommendations None      Frequency and Duration min 1 x/week  1 week       Prognosis        Swallow Study   General HPI: Jeffrey Campbell is a 86 y.o. male admitted with AMS with behavioral changes. PMH: dementia, upper airway resistance syndrome, OSA, HTN,  ETOG abuse, multiple thyroid nodules. Per chart family reports pt has been spitting out his meds. clinical swallow eval in 2018 revealing subjectively normal swallow function and reg/thin recommended.  Swallow eval this admission 4/30 with similar findings. CXR 5/3 with Left basilar airspace disease worrisome for pneumonia.  Swallow eval was re-ordered. Type of Study: Bedside Swallow Evaluation Previous Swallow Assessment: see HPI Diet Prior to this Study: Regular;Thin liquids Temperature Spikes Noted: No Respiratory Status: Room air History of Recent Intubation: No Behavior/Cognition: Lethargic/Drowsy Oral Cavity Assessment: Other (comment)(found with questionable emesis in mouth) Oral Care Completed by SLP: Yes Oral Cavity - Dentition: Adequate natural dentition Vision: Functional for self-feeding Self-Feeding Abilities: Able to feed self Patient Positioning: Upright in bed Baseline Vocal Quality: Normal Volitional Cough: Weak Volitional Swallow: Able to elicit    Oral/Motor/Sensory Function Overall Oral Motor/Sensory Function: Within functional limits   Ice Chips Ice chips: Not tested    Thin Liquid Thin Liquid: Within functional limits    Nectar Thick Nectar Thick Liquid: Not tested   Honey Thick Honey Thick Liquid: Not tested   Puree Puree: Not tested   Solid     Solid: Not tested      Juan Quam Laurice 07/30/2019,4:32 PM  Estill Bamberg L. Tivis Ringer, [place] Office number [phone] Pager [phone]

## 2019-07-31 ENCOUNTER — Other Ambulatory Visit: Payer: Medicare Other | Admitting: Internal Medicine

## 2019-07-31 DIAGNOSIS — F0390 Unspecified dementia without behavioral disturbance: Secondary | ICD-10-CM

## 2019-07-31 DIAGNOSIS — N186 End stage renal disease: Secondary | ICD-10-CM

## 2019-07-31 DIAGNOSIS — Z515 Encounter for palliative care: Secondary | ICD-10-CM

## 2019-07-31 LAB — BASIC METABOLIC PANEL
Anion gap: 21 — ABNORMAL HIGH (ref 5–15)
BUN: 94 mg/dL — ABNORMAL HIGH (ref 8–23)
CO2: 18 mmol/L — ABNORMAL LOW (ref 22–32)
Calcium: 10.1 mg/dL (ref 8.9–10.3)
Chloride: 100 mmol/L (ref 98–111)
Creatinine, Ser: 7.22 mg/dL — ABNORMAL HIGH (ref 0.61–1.24)
GFR calc Af Amer: 7 mL/min — ABNORMAL LOW (ref >60)
GFR calc non Af Amer: 6 mL/min — ABNORMAL LOW (ref >60)
Glucose, Bld: 154 mg/dL — ABNORMAL HIGH (ref 70–99)
Potassium: 4.1 mmol/L (ref 3.5–5.1)
Sodium: 139 mmol/L (ref 135–145)

## 2019-07-31 LAB — CBC
HCT: 30.9 % — ABNORMAL LOW (ref 39.0–52.0)
Hemoglobin: 9.7 g/dL — ABNORMAL LOW (ref 13.0–17.0)
MCH: 31 pg (ref 26.0–34.0)
MCHC: 31.4 g/dL (ref 30.0–36.0)
MCV: 98.7 fL (ref 80.0–100.0)
Platelets: 190 10*3/uL (ref 150–400)
RBC: 3.13 MIL/uL — ABNORMAL LOW (ref 4.22–5.81)
RDW: 14.8 % (ref 11.5–15.5)
WBC: 10.6 10*3/uL — ABNORMAL HIGH (ref 4.0–10.5)
nRBC: 0 % (ref 0.0–0.2)

## 2019-07-31 LAB — PROCALCITONIN: Procalcitonin: 2.54 ng/mL

## 2019-07-31 MED ORDER — VANCOMYCIN HCL IN DEXTROSE 500-5 MG/100ML-% IV SOLN
INTRAVENOUS | Status: AC
  Administered 2019-07-31: 500 mg via INTRAVENOUS
  Filled 2019-07-31: qty 100

## 2019-07-31 MED ORDER — CHLORHEXIDINE GLUCONATE CLOTH 2 % EX PADS: 6.0000 | MEDICATED_PAD | Freq: Every day | CUTANEOUS | Status: DC

## 2019-07-31 NOTE — Progress Notes (Signed)
Hemodialysis- Very difficult cannulation. Venous needle cannulated x4. Fistula remains swollen/bruised. 16g needles used with 300bfr for safety. Continue to monitor.

## 2019-07-31 NOTE — Progress Notes (Signed)
OT Cancellation Note  Patient Details Name: Jeffrey Campbell MRN: [id number] DOB: [birth year]   Cancelled Treatment:    Reason Eval/Treat Not Completed: Patient at procedure or test/ unavailable Pt taken to dialysis around 11:15; will reattempt to see as time allows.   Corinne Ports E. [place], [place] [facility] [place] 07/31/2019, 11:39 AM

## 2019-07-31 NOTE — Progress Notes (Signed)
PROGRESS NOTE  Jeffrey Campbell:[id number] DOB: 1932-04-29 DOA: 07/25/2019 PCP: [name], MD  HPI/Recap of past 24 hours: Jeffrey Campbell is a 86 y.o. male with medical history significant of complete heart block status post pacemaker, atrial flutter-on Eliquis, dementia, obstructive sleep apnea-not on CPAP, chronic diastolic CHF, pulmonary hypertension, coronary artery disease, ESRD on dialysis (TTS), anemia of chronic disease, hypothyroidism, hyperlipidemia, PVD who presented to ED from home with altered mental status.  History is provided by patient's daughter over the phone.  Patient has been more confused and not sleeping in the past 2 days.  Prior to coming to the ED he tried to leave the house half dressed.  His daughter called EMS.  Patient brought to ER for further evaluation and management.  He recently started hemodialysis.  He was dialyzed successfully on Friday and Monday however they attempted to dialyze him on Tuesday but could not dialyze due to difficulty with vein access.   Patient lives with his wife.   ED Course:  CT head negative for acute findings.  Chest x-ray shows cardiomegaly with central vascular engorgement with possible signs of interstitial edema.    TRH asked to admit.    Hospital course complicated by agitation for which we had to apply soft restraints.  He was seen by cardiology due to uptrending troponin S.  Thought to be nonspecific with no further work-up planned, cardiology signed off.  His altered mental status likely not related to uremia as he had been dialyzed.  Repeated CXR suggestive of pneumonia.  Started on IV antibiotics on 07/29/19 with improvement of his mentation and symptomatology.  07/31/19: Seen and examined.  Pleasant, alert and oriented X2.  He has no new complaints.  He is eating his food without difficulty, feeding assistance by nursing staff in place.  Passed his swallow evaluation by speech  therapy.   Assessment/Plan: Principal Problem:   Acute metabolic encephalopathy Active Problems:   OSA (obstructive sleep apnea)   Hypothyroidism   Anemia of chronic disease   Hypertension   ESRD (end stage renal disease) (HCC)   Elevated troponin   Delirium   Palliative care encounter   Acute metabolic encephalopathy, improved, in the setting of advanced dementia likely multifactorial secondary to acute illness, community-acquired pneumonia CT head non acute.  UA negative.  UDS negative.  TSH normal.  Ammonia level normal Started treatment for community-acquired pneumonia on 07/29/2019 with improvement of his mentation and symptomatology  Left lower lobe community-acquired pneumonia, POA  Likely early pneumonia when he presented not easily seen on initial chest x-ray Repeated chest x-ray showed left lower lobe infiltrates Started on Zosyn and vancomycin on 07/29/2019. Blood cultures drawn peripherally negative to date, continue to follow cultures  Elevated troponin nonspecific per cardiology  Troponin S peaked at 155 Seen by cardiology.  Thought to be nonspecific with no further work-up planned, signed off.  QTC prolongation QTC >545 Avoid QTC prolonging agents Has pacemaker in place  Severe pulmonary hypertension RSVP 72 as seen on 2 D echo done on 01/05/19 Seen by cardiology, no changes made.  Hypothyroidism TSH normal Continue levothyroxine  ESRD on HD TTS now MWF per family request Management per nephrology  History of complete heart block post pacemaker placement Continue to monitor  Elevated T bili Unclear etiology Possibly [name] syndrome?  CdCHF Volume addressed by HD Continue cardiac meds  PVD/HLD Continue statin  Dementia C/w home meds  Essential HTN BP stable for HD C/w home meds: On hydralazine  100 mg 3 times daily, and amlodipine 10 mg daily Continue to monitor vital signs  OSA Does not use CPAP at home  Physical debility/ambulatory  dysfunction PT recommended home health PT with 24-hour supervision assistance Continue PT OT with assistance and fall precautions Family requests discharge to home with home health services once medically cleared.  Goals of care Family has declined palliative care. Patient would benefit from palliative care follow-up outpatient Family can request PCP or nephrology to reactivate palliative care option Patient is full code    DVT prophylaxis: Eliquis/TED/SCD  Code Status: Full code-confirmed with the patient daughter  Family Communication:  Updated his daughter [name] 07/31/19.  Disposition Plan: From home.  Anticipate dc to Home in the next 24-48H to home with home health services. Barrier to [employer] treatment for pneumonia with IV antibiotics.   Consults called: Nephrology, vascular surgery, cardiology     Objective: Vitals:   07/31/19 1455 07/31/19 1500 07/31/19 1523 07/31/19 1555  BP: (!) 98/56 (!) 103/54 (!) 113/55 121/63  Pulse:  60 69 70  Resp:  16 15 18   Temp:   97.9 F (36.6 C) 98.5 F (36.9 C)  TempSrc:   Oral Oral  SpO2:   100% 100%  Weight:   52.6 kg   Height:        Intake/Output Summary (Last 24 hours) at 07/31/2019 1642 Last data filed at 07/31/2019 1621 Gross per 24 hour  Intake 2004.38 ml  Output -81 ml  Net 2085.38 ml   Filed Weights   07/31/19 0108 07/31/19 1119 07/31/19 1523  Weight: 50.3 kg 52.6 kg 52.6 kg    Exam:  . General: 86 y.o. year-old male alert and interactive.  No acute distress.  Oriented x2.  Improving. . Cardiovascular: Regular rate and rhythm no rubs or gallops.  Pacemaker in place right upper chest.   . Respiratory: Mild rales at bases with no wheezing noted. . Abdomen: Soft nontender normal bowel sounds present.   . Musculoskeletal: No lower extremity edema bilaterally.   Marland Kitchen Psychiatry: Mood is appropriate for condition and setting.  Data Reviewed: CBC: Recent Labs  Lab 07/25/19 1117 07/25/19 1125 07/27/19 0654 07/28/19 6440  07/29/19 0743 07/30/19 0317 07/31/19 0355  WBC 9.2   < > 13.4* 12.9* 13.4* 11.3* 10.6*  NEUTROABS 8.1*  --   --  11.5*  --   --   --   HGB 9.1*   < > 9.3* 9.5* 11.2* 10.4* 9.7*  HCT 29.7*   < > 29.9* 30.3* 35.7* 32.9* 30.9*  MCV 97.7   < > 98.0 97.1 95.5 96.8 98.7  PLT 164   < > 170 158 204 197 190   < > = values in this interval not displayed.   Basic Metabolic Panel: Recent Labs  Lab 07/25/19 2321 07/26/19 0254 07/27/19 0654 07/28/19 3474 07/29/19 0743 07/30/19 0317 07/31/19 0355  NA  --    < > 138 140 142 143 139  K  --    < > 3.9 3.7 4.2 4.3 4.1  CL  --    < > 102 103 100 103 100  CO2  --    < > 28 26 26 24  18*  GLUCOSE  --    < > 143* 116* 143* 148* 154*  BUN  --    < > 35* 50* 40* 68* 94*  CREATININE  --    < > 3.96* 4.65* 4.07* 5.60* 7.22*  CALCIUM  --    < >  9.6 10.6* 10.8* 10.6* 10.1  MG 2.4  --   --   --  2.1  --   --   PHOS 5.2*  --   --  3.6  --   --   --    < > = values in this interval not displayed.   GFR: Estimated Creatinine Clearance: 5.7 mL/min (A) (by C-G formula based on SCr of 7.22 mg/dL (H)). Liver Function Tests: Recent Labs  Lab 07/25/19 1117 07/26/19 0254 07/27/19 0654 07/28/19 0623  AST 32 33 27 31  ALT 13 14 12 13   ALKPHOS 61 60 64 70  BILITOT 1.1 1.4* 1.3* 1.4*  PROT 8.1 7.2 7.1 7.2  ALBUMIN 4.2 3.8 3.5 3.7   No results for input(s): LIPASE, AMYLASE in the last 168 hours. Recent Labs  Lab 07/25/19 1602  AMMONIA 24   Coagulation Profile: No results for input(s): INR, PROTIME in the last 168 hours. Cardiac Enzymes: No results for input(s): CKTOTAL, CKMB, CKMBINDEX, TROPONINI in the last 168 hours. BNP (last 3 results) Recent Labs    01/09/19 1019  PROBNP 21,225*   HbA1C: No results for input(s): HGBA1C in the last 72 hours. CBG: Recent Labs  Lab 07/25/19 1039 07/26/19 0137 07/28/19 1708  GLUCAP 130* 114* 130*   Lipid Profile: No results for input(s): CHOL, HDL, LDLCALC, TRIG, CHOLHDL, LDLDIRECT in the last 72  hours. Thyroid Function Tests: No results for input(s): TSH, T4TOTAL, FREET4, T3FREE, THYROIDAB in the last 72 hours. Anemia Panel: No results for input(s): VITAMINB12, FOLATE, FERRITIN, TIBC, IRON, RETICCTPCT in the last 72 hours. Urine analysis:    Component Value Date/Time   COLORURINE YELLOW 07/25/2019 1116   APPEARANCEUR HAZY (A) 07/25/2019 1116   LABSPEC 1.019 07/25/2019 1116   PHURINE 5.0 07/25/2019 1116   GLUCOSEU NEGATIVE 07/25/2019 1116   [place] 07/25/2019 1116   [place] 07/25/2019 1116   KETONESUR NEGATIVE 07/25/2019 1116   PROTEINUR 100 (A) 07/25/2019 1116   NITRITE NEGATIVE 07/25/2019 1116   LEUKOCYTESUR NEGATIVE 07/25/2019 1116   Sepsis Labs: @LABRCNTIP (procalcitonin:4,lacticidven:4)  ) Recent Results (from the past 240 hour(s))  Respiratory Panel by RT PCR (Flu A&B, Covid) - Nasopharyngeal Swab     Status: None   Collection Time: 07/25/19 12:36 PM   Specimen: Nasopharyngeal Swab  Result Value Ref Range Status   SARS Coronavirus 2 by RT PCR NEGATIVE NEGATIVE Final    Comment: (NOTE) SARS-CoV-2 target nucleic acids are NOT DETECTED. The SARS-CoV-2 RNA is generally detectable in upper respiratoy specimens during the acute phase of infection. The lowest concentration of SARS-CoV-2 viral copies this assay can detect is 131 copies/mL. A negative result does not preclude SARS-Cov-2 infection and should not be used as the sole basis for treatment or other patient management decisions. A negative result may occur with  improper specimen collection/handling, submission of specimen other than nasopharyngeal swab, presence of viral mutation(s) within the areas targeted by this assay, and inadequate number of viral copies (<131 copies/mL). A negative result must be combined with clinical observations, patient history, and epidemiological information. The expected result is Negative. Fact Sheet for Patients:   PinkCheek.be Fact Sheet for Healthcare Providers:  GravelBags.it This test is not yet ap proved or cleared by the [place] FDA and  has been authorized for detection and/or diagnosis of SARS-CoV-2 by FDA under an Emergency Use Authorization (EUA). This EUA will remain  in effect (meaning this test can be used) for the duration of the COVID-19 declaration under Section  564(b)(1) of the Act, 21 U.S.C. section 360bbb-3(b)(1), unless the authorization is terminated or revoked sooner.    Influenza A by PCR NEGATIVE NEGATIVE Final   Influenza B by PCR NEGATIVE NEGATIVE Final    Comment: (NOTE) The Xpert Xpress SARS-CoV-2/FLU/RSV assay is intended as an aid in  the diagnosis of influenza from Nasopharyngeal swab specimens and  should not be used as a sole basis for treatment. Nasal washings and  aspirates are unacceptable for Xpert Xpress SARS-CoV-2/FLU/RSV  testing. Fact Sheet for Patients: PinkCheek.be Fact Sheet for Healthcare Providers: GravelBags.it This test is not yet approved or cleared by the [place] FDA and  has been authorized for detection and/or diagnosis of SARS-CoV-2 by  FDA under an Emergency Use Authorization (EUA). This EUA will remain  in effect (meaning this test can be used) for the duration of the  Covid-19 declaration under Section 564(b)(1) of the Act, 21  U.S.C. section 360bbb-3(b)(1), unless the authorization is  terminated or revoked. Performed at [facility], [place] [address] [place], [place] [address]   MRSA PCR Screening     Status: None   Collection Time: 07/25/19 10:06 PM   Specimen: Nasal Mucosa; Nasopharyngeal  Result Value Ref Range Status   MRSA by PCR NEGATIVE NEGATIVE Final    Comment:        The GeneXpert MRSA Assay (FDA approved for NASAL specimens only), is one component of a comprehensive MRSA  colonization surveillance program. It is not intended to diagnose MRSA infection nor to guide or monitor treatment for MRSA infections. Performed at [facility], [place] [address], [place], [place] [address]   Culture, blood (routine x 2)     Status: None (Preliminary result)   Collection Time: 07/29/19  7:42 AM   Specimen: BLOOD RIGHT ARM  Result Value Ref Range Status   Specimen Description BLOOD RIGHT ARM  Final   Special Requests   Final    BOTTLES DRAWN AEROBIC AND ANAEROBIC Blood Culture adequate volume   Culture   Final    NO GROWTH 2 DAYS Performed at [facility], [address], [place], [place] [address]    Report Status PENDING  Incomplete  Culture, blood (routine x 2)     Status: None (Preliminary result)   Collection Time: 07/29/19  7:43 AM   Specimen: BLOOD RIGHT HAND  Result Value Ref Range Status   Specimen Description BLOOD RIGHT HAND  Final   Special Requests   Final    BOTTLES DRAWN AEROBIC AND ANAEROBIC Blood Culture adequate volume   Culture   Final    NO GROWTH 2 DAYS Performed at [facility], [place] [address] [place], [place] [address]    Report Status PENDING  Incomplete      Studies: No results found.  Scheduled Meds: . amLODipine  10 mg Oral Daily  . apixaban  2.5 mg Oral BID  . atorvastatin  20 mg Oral Daily  . Chlorhexidine Gluconate Cloth  6 each Topical Q0600  . Chlorhexidine Gluconate Cloth  6 each Topical Q0600  . donepezil  10 mg Oral QHS  . doxazosin  4 mg Oral Daily  . hydrALAZINE  100 mg Oral TID  . levothyroxine  150 mcg Oral QAC breakfast  . memantine  10 mg Oral BID  . vancomycin  500 mg Intravenous Q T,Th,Sa-HD    Continuous Infusions: . piperacillin-tazobactam (ZOSYN)  IV 2.25 g (07/31/19 1553)     LOS: 6  days     Kayleen Memos, MD [facility] Pager [phone]  If 7PM-7AM, please contact night-coverage [email or web address] Password [facility] 07/31/2019, 4:42 PM

## 2019-07-31 NOTE — Progress Notes (Signed)
Patient ID: Jeffrey Campbell, male   DOB: 11-29-1932, 86 y.o.   MRN: [id number]  [facility] Progress Note    Subjective:   Alert, on HD now, calm and cooperative   Objective:   BP (!) 113/55 (BP Location: Right Arm)   Pulse 69   Temp 97.9 F (36.6 C) (Oral)   Resp 15   Ht 5\' 8"  (1.727 m)   Wt 52.6 kg   SpO2 100%   BMI 17.63 kg/m   OP HD:  TTS [facility], has only had 2 sessions so far (4/23, 4/26) and was supposed to switch to MWF on Monday 5/3   2.5 hr  BFR 250 2K/2Ca   LUA  AVF   Hep none  - time/BFR being advanced as new patient   - No venofer or ESA yet, Hgb there 11.1 - Calcitriol 0.79mcg PO q HD     Physical Exam: Gen: much more alert, and responds normally today, Ox 1 but following commands, calm not agitated CVS: Pulse regular rhythm, normal rate, 2/6 ejection systolic murmur Resp: Clear bilat Abd: Soft, flat, nontender, bowel sounds normal Ext: No lower extremity edema, left brachiocephalic fistula is pulsatile with some ecchymosis from recent infiltration Neuro: pt in mittens, confused and minimally verbal  Assessment/ Plan:   1.  Altered mental status in patient with underlying dementia: no indication of uremia.   Per Dr Jason Nest note, goal per family is to get him back home and to OP HD. Pt looks much better today after IV abx for PNA yesterday were started. He is alert and responsive today.  2. ^WBC/ LLL infiltrate - poss PNA, is on IV abx. Per primary team.    3. ESRD: recent start (OP HD x 2). Has had HD x 3 here. Plan HD today then tomorrow again to get back on MWF schedule.  4. Anemia: With marginally low hemoglobin and hematocrit, monitor on ESA.  Hb 9.7- 10.5 here.  5. CKD-MBD: Hypercalcemia noted, continue low calcium dialysate.  Phosphorus at goal.   6. Nutrition: Continue renal diet with ongoing nutritional supplementation. 7. Hypertension: Blood pressures within acceptable range with ongoing medication/HD  Kelly Splinter, MD 07/30/2019, 12:00  PM  Labs: BMET Recent Labs  Lab 07/25/19 1117 07/25/19 1117 07/25/19 1125 07/25/19 2321 07/26/19 0254 07/27/19 5852 07/28/19 7782 07/29/19 0743 07/30/19 0317 07/31/19 0355  NA 143   < > 144  --  144 138 140 142 143 139  K 4.1   < > 4.0  --  3.7 3.9 3.7 4.2 4.3 4.1  CL 103   < > 102  --  106 102 103 100 103 100  CO2 25  --   --   --  26 28 26 26 24  18*  GLUCOSE 124*   < > 120*  --  128* 143* 116* 143* 148* 154*  BUN 52*   < > 48*  --  58* 35* 50* 40* 68* 94*  CREATININE 5.54*   < > 5.70*  --  5.64* 3.96* 4.65* 4.07* 5.60* 7.22*  CALCIUM 11.1*  --   --   --  10.5* 9.6 10.6* 10.8* 10.6* 10.1  PHOS  --   --   --  5.2*  --   --  3.6  --   --   --    < > = values in this interval not displayed.   CBC Recent Labs  Lab 07/25/19 1117 07/25/19 1125 07/28/19 4235 07/29/19 0743 07/30/19 0317 07/31/19  0355  WBC 9.2   < > 12.9* 13.4* 11.3* 10.6*  NEUTROABS 8.1*  --  11.5*  --   --   --   HGB 9.1*   < > 9.5* 11.2* 10.4* 9.7*  HCT 29.7*   < > 30.3* 35.7* 32.9* 30.9*  MCV 97.7   < > 97.1 95.5 96.8 98.7  PLT 164   < > 158 204 197 190   < > = values in this interval not displayed.     Medications:    . amLODipine  10 mg Oral Daily  . apixaban  2.5 mg Oral BID  . atorvastatin  20 mg Oral Daily  . Chlorhexidine Gluconate Cloth  6 each Topical Q0600  . Chlorhexidine Gluconate Cloth  6 each Topical Q0600  . donepezil  10 mg Oral QHS  . doxazosin  4 mg Oral Daily  . hydrALAZINE  100 mg Oral TID  . levothyroxine  150 mcg Oral QAC breakfast  . memantine  10 mg Oral BID  . vancomycin  500 mg Intravenous Q T,Th,Sa-HD

## 2019-08-01 ENCOUNTER — Other Ambulatory Visit (HOSPITAL_COMMUNITY): Payer: Self-pay | Admitting: Cardiology

## 2019-08-01 DIAGNOSIS — R778 Other specified abnormalities of plasma proteins: Secondary | ICD-10-CM

## 2019-08-01 DIAGNOSIS — D638 Anemia in other chronic diseases classified elsewhere: Secondary | ICD-10-CM

## 2019-08-01 DIAGNOSIS — I10 Essential (primary) hypertension: Secondary | ICD-10-CM

## 2019-08-01 LAB — BASIC METABOLIC PANEL
Anion gap: 14 (ref 5–15)
BUN: 38 mg/dL — ABNORMAL HIGH (ref 8–23)
CO2: 28 mmol/L (ref 22–32)
Calcium: 9.3 mg/dL (ref 8.9–10.3)
Chloride: 93 mmol/L — ABNORMAL LOW (ref 98–111)
Creatinine, Ser: 4.6 mg/dL — ABNORMAL HIGH (ref 0.61–1.24)
GFR calc Af Amer: 13 mL/min — ABNORMAL LOW (ref >60)
GFR calc non Af Amer: 11 mL/min — ABNORMAL LOW (ref >60)
Glucose, Bld: 131 mg/dL — ABNORMAL HIGH (ref 70–99)
Potassium: 3.3 mmol/L — ABNORMAL LOW (ref 3.5–5.1)
Sodium: 135 mmol/L (ref 135–145)

## 2019-08-01 LAB — CBC
HCT: 27.3 % — ABNORMAL LOW (ref 39.0–52.0)
Hemoglobin: 8.8 g/dL — ABNORMAL LOW (ref 13.0–17.0)
MCH: 31.1 pg (ref 26.0–34.0)
MCHC: 32.2 g/dL (ref 30.0–36.0)
MCV: 96.5 fL (ref 80.0–100.0)
Platelets: 194 10*3/uL (ref 150–400)
RBC: 2.83 MIL/uL — ABNORMAL LOW (ref 4.22–5.81)
RDW: 14.7 % (ref 11.5–15.5)
WBC: 8.7 10*3/uL (ref 4.0–10.5)
nRBC: 0 % (ref 0.0–0.2)

## 2019-08-01 NOTE — Progress Notes (Signed)
[facility] Consult Note Telephone: [phone]  Fax: [phone]  PATIENT NAME: Jeffrey Campbell DOB: [birth year] MRN: [id number]  PRIMARY CARE PROVIDER:   [name], MD  REFERRING PROVIDER:  [name], MD [place],  [place] [id number]    NOTE:  Arrived at patient's home for a previously scheduled visit.  Met at door by wife who states that patient is currently hospitalized related to increased confusion.  He has begun hemodialysis treatments since last visit. Support given.  Palliative care will follow-up with patient after discharge if still in need of palliative services.  Gonzella Lex, NP        Gonzella Lex, NP

## 2019-08-01 NOTE — Progress Notes (Signed)
PT reported patient had an episode of dizziness while standing to move to chair. BP while standing 78/46, sitting 95/50. Patient helped back to bed, BP is now back to 99/68. Patient denies any symptoms. Dr. Aileen Fass made aware via test page. Will continue to monitor

## 2019-08-01 NOTE — Progress Notes (Signed)
Physical Therapy Treatment Note   Patient with limited mobility this session due to dizziness in sitting/standing and with orthostatic BP sitting to standing. Pt able to stand X 2 trials with min-mod A due to posterior bias. PT will continue to follow acutely and progress as tolerated.   BP in sitting 95/50 (64) BP in standing 78/46 (56) BP in supine 99/68 (77)   08/01/19 1400  PT Visit Information  Last PT Received On 08/01/19  Assistance Needed +2 (+2 for safety )  PT/OT/SLP Co-Evaluation/Treatment Yes  Reason for Co-Treatment For patient/therapist safety  PT goals addressed during session Mobility/safety with mobility  History of Present Illness 86 yo admitted with increased confusion and fall at home with additional fall am of 4/29. Pt with encephalopathy and euvolemic. PMHx: dementia, complete heart block s/p PPM, pulm HTN, OSA, dementia, Graves dz, glaucoma, CAD, ESRD (TTS), PVD, BPH, Aflutter  Precautions  Precautions Fall  Precaution Comments incontinent  Restrictions  Weight Bearing Restrictions No  Pain Assessment  Pain Assessment Faces  Faces Pain Scale 4  Pain Location unspecified  Pain Descriptors / Indicators Grimacing  Pain Intervention(s) Monitored during session  Cognition  Arousal/Alertness Awake/alert  Behavior During Therapy Flat affect  Overall Cognitive Status History of cognitive impairments - at baseline  General Comments h/o dementia at baseline. able to state name, DOB, and place. slow processing   Bed Mobility  Overal bed mobility Needs Assistance  Bed Mobility Supine to Sit;Sit to Supine  Supine to sit Min guard  Sit to supine Min guard  General bed mobility comments use of rail; increased time and effort; min guard for safety. some cues for sequencing  Transfers  Overall transfer level Needs assistance  Equipment used Rolling walker (2 wheeled)  Transfers Sit to/from Stand  Sit to Stand Min assist;Mod assist  General transfer comment min A to  power up and steady; mod A in standing due to posterior bias and dizziness; assist for safe descent back to sitting EOB  Ambulation/Gait  General Gait Details deferred due to orthostasis and pt symptomatic  Balance  Overall balance assessment Needs assistance;History of Falls  Sitting-balance support Feet supported;Bilateral upper extremity supported  Sitting balance-Leahy Scale Fair  Sitting balance - Comments tends to sit in BUE support position. + dizziness sitting EOB  Postural control Posterior lean  Standing balance support Bilateral upper extremity supported;During functional activity  Standing balance-Leahy Scale Poor  PT - End of Session  Equipment Utilized During Treatment Gait belt  Activity Tolerance Other (comment) (limited by dizziness and orthostatic bp)  Patient left in bed;with call bell/phone within reach;with bed alarm set  Nurse Communication Mobility status   PT - Assessment/Plan  PT Plan Current plan remains appropriate  PT Visit Diagnosis Difficulty in walking, not elsewhere classified (R26.2);Other abnormalities of gait and mobility (R26.89);History of falling (Z91.81);Muscle weakness (generalized) (M62.81)  PT Frequency (ACUTE ONLY) Min 3X/week  Follow Up Recommendations Home health PT;Supervision/Assistance - 24 hour  PT equipment Rolling walker with 5" wheels  AM-PAC PT "6 Clicks" Mobility Outcome Measure (Version 2)  Help needed turning from your back to your side while in a flat bed without using bedrails? 3  Help needed moving from lying on your back to sitting on the side of a flat bed without using bedrails? 3  Help needed moving to and from a bed to a chair (including a wheelchair)? 3  Help needed standing up from a chair using your arms (e.g., wheelchair or bedside chair)? 3  Help needed to walk in hospital room? 3  Help needed climbing 3-5 steps with a railing?  2  6 Click Score 17  Consider Recommendation of Discharge To: Home with [facility]  PT Goal  Progression  Progress towards PT goals Progressing toward goals  PT Time Calculation  PT Start Time (ACUTE ONLY) 0950  PT Stop Time (ACUTE ONLY) 1030  PT Time Calculation (min) (ACUTE ONLY) 40 min  PT General Charges  $$ ACUTE PT VISIT 1 Visit  PT Treatments  $Therapeutic Activity 23-37 mins   Earney Navy, PTA Acute Rehabilitation Services Pager: [phone] Office: [phone]

## 2019-08-01 NOTE — Progress Notes (Signed)
Pharmacy Antibiotic Note  Jeffrey Campbell is a 86 y.o. male admitted on 07/25/2019 and now with concern for sepsis/pneumonia.  Pharmacy has been consulted for Vancomycin + Zosyn dosing starting on 5/2  The patient is ESRD-TTS HD with plans for MWF HD as outpatient  Blood cultures negative to date, slowly improving  Plan: - Continue Vancomycin 1g IV x 1 followed by 500 mg/HD-TTS - Continue Zosyn 2.25g IV every 8 hours - Continue to follow  Height: 5\' 8"  (172.7 cm) Weight: 51.3 kg (113 lb) IBW/kg (Calculated) : 68.4  Temp (24hrs), Avg:98.1 F (36.7 C), Min:97.7 F (36.5 C), Max:98.9 F (37.2 C)  Recent Labs  Lab 07/28/19 0623 07/29/19 0743 07/30/19 0317 07/31/19 0355 08/01/19 0510  WBC 12.9* 13.4* 11.3* 10.6* 8.7  CREATININE 4.65* 4.07* 5.60* 7.22* 4.60*    Estimated Creatinine Clearance: 8.7 mL/min (A) (by C-G formula based on SCr of 4.6 mg/dL (H)).    Allergies  Allergen Reactions  . Iodinated Diagnostic Agents Other (See Comments)    Right heart cath 07/2016 allegedly "shut down his kidneys" Patient has stage III chronic kidney disease  . Zocor [Simvastatin] Other (See Comments)    Liver problems   . Budesonide-Formoterol Fumarate Other (See Comments)    Dry mouth  . Minoxidil Other (See Comments)    Unknown raction  . Tiotropium Bromide Monohydrate Other (See Comments)    Dry mouth  . Tricor [Fenofibrate]     Unknown reaction    Vanc 5/2 >> Zosyn 5/2 >>  4/28 Fluvid >> neg 4/28 MRSA PCR >> neg 5/2 BCx >> NGTD  Thank you  Anette Guarneri, PharmD Clinical phone for 08/01/2019: [phone] 08/01/2019 9:58 AM   **Pharmacist phone directory can now be found on [email or web address] ([email or web address]).  Listed under [place].

## 2019-08-01 NOTE — Progress Notes (Signed)
Patient ID: Jeffrey Campbell, male   DOB: 27-Mar-1933, 86 y.o.   MRN: [id number]  [facility] Progress Note    Subjective:   Seen in room, abd hurting a bit, no new c/o   Objective:   BP (!) 97/53   Pulse 70   Temp 98.2 F (36.8 C) (Oral)   Resp 18   Ht 5\' 8"  (1.727 m)   Wt 53.5 kg   SpO2 98%   BMI 17.94 kg/m   OP HD:  TTS [facility], has only had 2 sessions so far (4/23, 4/26) and was supposed to switch to MWF on Monday 5/3   2.5 hr  BFR 250 2K/2Ca   LUA  AVF   Hep none  - time/BFR being advanced as new patient   - No venofer or ESA yet, Hgb there 11.1 - Calcitriol 0.98mcg PO q HD     Physical Exam: Gen: awake and alert CVS: Pulse regular rhythm, normal rate, 2/6 ejection systolic murmur Resp: Clear bilat Abd: Soft, flat, nontender, bowel sounds normal Ext: No lower extremity edema, left brachiocephalic fistula is pulsatile with some ecchymosis from recent infiltration Neuro: pt in mittens, confused and minimally verbal  Assessment/ Plan:   1.  Altered mental status in patient with underlying dementia: much better, fully alert and interactive now.  Possibly benefiting from abx / Rx of  Infection.  2. PNA  - is on IV abx, LLL infiltrate on CXR. Per primary team.    3. ESRD: recent start (OP HD x 2). Has had HD x 3 here. HD today on schedule. Doing well now on HD here, no Campbell agitation. OK for dc from renal standpoint.  4. Anemia: With marginally low hemoglobin and hematocrit, monitor on ESA.  Hb 9.7- 10.5 here.  5. CKD-MBD: Hypercalcemia noted, continue low calcium dialysate.  Phosphorus at goal.   6. Nutrition: Continue renal diet with ongoing nutritional supplementation. 7. Hypertension: Blood pressures within acceptable range with ongoing medication/HD  Kelly Splinter, MD 07/30/2019, 12:00 PM  Labs: BMET Recent Labs  Lab 07/25/19 2321 07/26/19 0254 07/27/19 2376 07/28/19 2831 07/29/19 5176 07/30/19 0317 07/31/19 0355 08/01/19 0510  NA  --  144 138 140  142 143 139 135  K  --  3.7 3.9 3.7 4.2 4.3 4.1 3.3*  CL  --  106 102 103 100 103 100 93*  CO2  --  26 28 26 26 24  18* 28  GLUCOSE  --  128* 143* 116* 143* 148* 154* 131*  BUN  --  58* 35* 50* 40* 68* 94* 38*  CREATININE  --  5.64* 3.96* 4.65* 4.07* 5.60* 7.22* 4.60*  CALCIUM  --  10.5* 9.6 10.6* 10.8* 10.6* 10.1 9.3  PHOS 5.2*  --   --  3.6  --   --   --   --    CBC Recent Labs  Lab 07/28/19 0623 07/28/19 0623 07/29/19 0743 07/30/19 0317 07/31/19 0355 08/01/19 0510  WBC 12.9*   < > 13.4* 11.3* 10.6* 8.7  NEUTROABS 11.5*  --   --   --   --   --   HGB 9.5*   < > 11.2* 10.4* 9.7* 8.8*  HCT 30.3*   < > 35.7* 32.9* 30.9* 27.3*  MCV 97.1   < > 95.5 96.8 98.7 96.5  PLT 158   < > 204 197 190 194   < > = values in this interval not displayed.     Medications:    .  amLODipine  10 mg Oral Daily  . apixaban  2.5 mg Oral BID  . atorvastatin  20 mg Oral Daily  . Chlorhexidine Gluconate Cloth  6 each Topical Q0600  . Chlorhexidine Gluconate Cloth  6 each Topical Q0600  . donepezil  10 mg Oral QHS  . doxazosin  4 mg Oral Daily  . hydrALAZINE  100 mg Oral TID  . levothyroxine  150 mcg Oral QAC breakfast  . memantine  10 mg Oral BID  . vancomycin  500 mg Intravenous Q T,Th,Sa-HD

## 2019-08-01 NOTE — Progress Notes (Signed)
SLP Cancellation Note  Patient Details Name: ZAILEN ALBARRAN MRN: [id number] DOB: 16-Mar-1933   Cancelled treatment:       Reason Eval/Treat Not Completed: Patient at procedure or test/unavailable   Georgiann Neider P. Harshaan Whang, M.S., [place] Pathologist Acute Rehabilitation Services Pager: [place] 08/01/2019, 1:50 PM

## 2019-08-01 NOTE — Progress Notes (Signed)
[facility] PROGRESS NOTE    Progress Note  Jeffrey Campbell  YKD:[id number] DOB: April 10, 1932 DOA: 07/25/2019 PCP: [name], MD     Brief Narrative:   Jeffrey Campbell is an 86 y.o. male past medical history of complete heart block status post pacemaker atrial flutter on Eliquis dementia obstructive sleep apnea chronic diastolic heart failure pulmonary hypertension presents to the ED with altered mental status.  Assessment/Plan:   Acute metabolic encephalopathy in the setting of dementia likely multifactorial due to acute kidney once community-acquired pneumonia: No acute findings, UDS and UA were negative. You started empirically on community-acquired treatment regimen and is symptomatology is improved.  Left lower lobe pneumonia Early has not easily visible on chest x-ray started on Vanco and Zosyn as blood cultures have been negative till date, will continue IV antibiotic for an additional day. Patient still relates some mild nausea.  Elevated troponins: Seen by cardiology is likely demand ischemia they have signed off  Prolonged QTC: Avoid prolonging agents try to keep potassium greater than 4 magnesium greater than 2.  Severe pulmonary hypertension: Seen by cardiology no changes made.  Hypothyroidism: Continue Synthroid.  End-stage renal disease on hemodialysis: Continue HD.  History of complete heart block: Stable.  Elevated T bili: Unclear etiology possibly Gilbert's syndrome.  Stage I sacral decubitus ulcer present on admission: RN Pressure Injury Documentation: Pressure Injury 09/02/18 Sacrum Mid Stage I -  Intact skin with non-blanchable redness of a localized area usually over a bony prominence. (Active)  09/02/18 0017  Location: Sacrum  Location Orientation: Mid  Staging: Stage I -  Intact skin with non-blanchable redness of a localized area usually over a bony prominence.  Wound Description (Comments):   Present on Admission: Yes     Estimated body mass index is 17.94 kg/m as calculated from the following:   Height as of this encounter: 5\' 8"  (1.727 m).   Weight as of this encounter: 53.5 kg.   DVT prophylaxis: lovenox Family Communication:none Status is: Inpatient  Remains inpatient appropriate because:IV treatments appropriate due to intensity of illness or inability to take PO   Dispo: The patient is from: SNF              Anticipated d/c is to: SNF              Anticipated d/c date is: 1 day              Patient currently is not medically stable to d/c.         Code Status:     Code Status Orders  (From admission, onward)         Start     Ordered   07/25/19 1614  Full code  Continuous     07/25/19 1614        Code Status History    Date Active Date Inactive Code Status Order ID Comments User Context   02/08/2019 1735 02/12/2019 1552 Full Code [id number]  Conrad [place], NP Inpatient   09/01/2018 2258 09/03/2018 1846 Full Code [id number]  Rise Patience, MD ED   05/20/2018 2200 05/23/2018 1753 Full Code [id number]  Vianne Bulls, MD ED   10/15/2016 1655 10/17/2016 2055 Full Code [id number]  Rosita Fire, MD ED   09/05/2016 0247 09/24/2016 2028 Full Code [id number]  Luz Brazen, MD ED   07/28/2016 0915 07/28/2016 1358 Full Code [id number]  Larey Dresser, MD Inpatient   02/17/2015 0831 02/17/2015 1315 Full Code [id number]  Larey Dresser, MD Inpatient   Advance Care Planning Activity        IV Access:    Peripheral IV   Procedures and diagnostic studies:   No results found.   Medical Consultants:    None.  Anti-Infectives:   Zosyn  Subjective:    Jeffrey Campbell he relates some nausea but otherwise feels better than yesterday.  Objective:    Vitals:   08/01/19 1030 08/01/19 1156 08/01/19 1240 08/01/19 1300  BP: 99/68 (!) 113/56 (!) 117/59 (!) 97/53  Pulse: 70 70 70 70  Resp: 19 17 18    Temp:  98.7 F (37.1 C) 98.2 F (36.8 C)   TempSrc:  Oral Oral    SpO2:   98%   Weight:   53.5 kg   Height:       SpO2: 98 %   Intake/Output Summary (Last 24 hours) at 08/01/2019 1414 Last data filed at 08/01/2019 0828 Gross per 24 hour  Intake 1758.55 ml  Output -81 ml  Net 1839.55 ml   Filed Weights   07/31/19 1523 08/01/19 0432 08/01/19 1240  Weight: 52.6 kg 51.3 kg 53.5 kg    Exam: General exam: In no acute distress. Respiratory system: Good air movement and clear to auscultation. Cardiovascular system: S1 & S2 heard, RRR. No JVD. Gastrointestinal system: Abdomen is nondistended, soft and nontender.  Central nervous system: Alert and oriented. No focal neurological deficits. Extremities: No pedal edema. Skin: No rashes, lesions or ulcers  Data Reviewed:    Labs: Basic Metabolic Panel: Recent Labs  Lab 07/25/19 2321 07/26/19 0254 07/28/19 2536 07/28/19 6440 07/29/19 3474 07/29/19 0743 07/30/19 0317 07/30/19 0317 07/31/19 0355 08/01/19 0510  NA  --    < > 140  --  142  --  143  --  139 135  K  --    < > 3.7   < > 4.2   < > 4.3   < > 4.1 3.3*  CL  --    < > 103  --  100  --  103  --  100 93*  CO2  --    < > 26  --  26  --  24  --  18* 28  GLUCOSE  --    < > 116*  --  143*  --  148*  --  154* 131*  BUN  --    < > 50*  --  40*  --  68*  --  94* 38*  CREATININE  --    < > 4.65*  --  4.07*  --  5.60*  --  7.22* 4.60*  CALCIUM  --    < > 10.6*  --  10.8*  --  10.6*  --  10.1 9.3  MG 2.4  --   --   --  2.1  --   --   --   --   --   PHOS 5.2*  --  3.6  --   --   --   --   --   --   --    < > = values in this interval not displayed.   GFR Estimated Creatinine Clearance: 9 mL/min (A) (by C-G formula based on SCr of 4.6 mg/dL (H)). Liver Function Tests: Recent Labs  Lab 07/26/19 0254 07/27/19 0654 07/28/19 0623  AST 33 27 31  ALT 14 12 13   ALKPHOS 60 64 70  BILITOT 1.4* 1.3* 1.4*  PROT 7.2 7.1  7.2  ALBUMIN 3.8 3.5 3.7   No results for input(s): LIPASE, AMYLASE in the last 168 hours. Recent Labs  Lab 07/25/19 1602   AMMONIA 24   Coagulation profile No results for input(s): INR, PROTIME in the last 168 hours. COVID-19 Labs  No results for input(s): DDIMER, FERRITIN, LDH, CRP in the last 72 hours.  Lab Results  Component Value Date   SARSCOV2NAA NEGATIVE 07/25/2019   SARSCOV2NAA NOT DETECTED 02/05/2019   [place] NEGATIVE 09/01/2018    CBC: Recent Labs  Lab 07/28/19 0623 07/29/19 0743 07/30/19 0317 07/31/19 0355 08/01/19 0510  WBC 12.9* 13.4* 11.3* 10.6* 8.7  NEUTROABS 11.5*  --   --   --   --   HGB 9.5* 11.2* 10.4* 9.7* 8.8*  HCT 30.3* 35.7* 32.9* 30.9* 27.3*  MCV 97.1 95.5 96.8 98.7 96.5  PLT 158 204 197 190 194   Cardiac Enzymes: No results for input(s): CKTOTAL, CKMB, CKMBINDEX, TROPONINI in the last 168 hours. BNP (last 3 results) Recent Labs    01/09/19 1019  PROBNP 21,225*   CBG: Recent Labs  Lab 07/26/19 0137 07/28/19 1708  GLUCAP 114* 130*   D-Dimer: No results for input(s): DDIMER in the last 72 hours. Hgb A1c: No results for input(s): HGBA1C in the last 72 hours. Lipid Profile: No results for input(s): CHOL, HDL, LDLCALC, TRIG, CHOLHDL, LDLDIRECT in the last 72 hours. Thyroid function studies: No results for input(s): TSH, T4TOTAL, T3FREE, THYROIDAB in the last 72 hours.  Invalid input(s): FREET3 Anemia work up: No results for input(s): VITAMINB12, FOLATE, FERRITIN, TIBC, IRON, RETICCTPCT in the last 72 hours. Sepsis Labs: Recent Labs  Lab 07/29/19 0743 07/30/19 0317 07/31/19 0355 08/01/19 0510  PROCALCITON 2.10 3.02 2.54  --   WBC 13.4* 11.3* 10.6* 8.7   Microbiology Recent Results (from the past 240 hour(s))  Respiratory Panel by RT PCR (Flu A&B, Covid) - Nasopharyngeal Swab     Status: None   Collection Time: 07/25/19 12:36 PM   Specimen: Nasopharyngeal Swab  Result Value Ref Range Status   SARS Coronavirus 2 by RT PCR NEGATIVE NEGATIVE Final    Comment: (NOTE) SARS-CoV-2 target nucleic acids are NOT DETECTED. The SARS-CoV-2 RNA is  generally detectable in upper respiratoy specimens during the acute phase of infection. The lowest concentration of SARS-CoV-2 viral copies this assay can detect is 131 copies/mL. A negative result does not preclude SARS-Cov-2 infection and should not be used as the sole basis for treatment or other patient management decisions. A negative result may occur with  improper specimen collection/handling, submission of specimen other than nasopharyngeal swab, presence of viral mutation(s) within the areas targeted by this assay, and inadequate number of viral copies (<131 copies/mL). A negative result must be combined with clinical observations, patient history, and epidemiological information. The expected result is Negative. Fact Sheet for Patients:  PinkCheek.be Fact Sheet for Healthcare Providers:  GravelBags.it This test is not yet ap proved or cleared by the [place] FDA and  has been authorized for detection and/or diagnosis of SARS-CoV-2 by FDA under an Emergency Use Authorization (EUA). This EUA will remain  in effect (meaning this test can be used) for the duration of the COVID-19 declaration under Section 564(b)(1) of the Act, 21 U.S.C. section 360bbb-3(b)(1), unless the authorization is terminated or revoked sooner.    Influenza A by PCR NEGATIVE NEGATIVE Final   Influenza B by PCR NEGATIVE NEGATIVE Final    Comment: (NOTE) The Xpert Xpress SARS-CoV-2/FLU/RSV assay is intended as  an aid in  the diagnosis of influenza from Nasopharyngeal swab specimens and  should not be used as a sole basis for treatment. Nasal washings and  aspirates are unacceptable for Xpert Xpress SARS-CoV-2/FLU/RSV  testing. Fact Sheet for Patients: PinkCheek.be Fact Sheet for Healthcare Providers: GravelBags.it This test is not yet approved or cleared by the [place] FDA and    has been authorized for detection and/or diagnosis of SARS-CoV-2 by  FDA under an Emergency Use Authorization (EUA). This EUA will remain  in effect (meaning this test can be used) for the duration of the  Covid-19 declaration under Section 564(b)(1) of the Act, 21  U.S.C. section 360bbb-3(b)(1), unless the authorization is  terminated or revoked. Performed at [facility], [place] [address], [place], [place] [address]   MRSA PCR Screening     Status: None   Collection Time: 07/25/19 10:06 PM   Specimen: Nasal Mucosa; Nasopharyngeal  Result Value Ref Range Status   MRSA by PCR NEGATIVE NEGATIVE Final    Comment:        The GeneXpert MRSA Assay (FDA approved for NASAL specimens only), is one component of a comprehensive MRSA colonization surveillance program. It is not intended to diagnose MRSA infection nor to guide or monitor treatment for MRSA infections. Performed at [facility], [place] [address], [place], [place] [address]   Culture, blood (routine x 2)     Status: None (Preliminary result)   Collection Time: 07/29/19  7:42 AM   Specimen: BLOOD RIGHT ARM  Result Value Ref Range Status   Specimen Description BLOOD RIGHT ARM  Final   Special Requests   Final    BOTTLES DRAWN AEROBIC AND ANAEROBIC Blood Culture adequate volume   Culture   Final    NO GROWTH 3 DAYS Performed at [facility], [address] [place], [place] [address]    Report Status PENDING  Incomplete  Culture, blood (routine x 2)     Status: None (Preliminary result)   Collection Time: 07/29/19  7:43 AM   Specimen: BLOOD RIGHT HAND  Result Value Ref Range Status   Specimen Description BLOOD RIGHT HAND  Final   Special Requests   Final    BOTTLES DRAWN AEROBIC AND ANAEROBIC Blood Culture adequate volume   Culture   Final    NO GROWTH 3 DAYS Performed at [facility], [place] [address] [place], [place] [address]    Report Status PENDING  Incomplete     Medications:   .  amLODipine  10 mg Oral Daily  . apixaban  2.5 mg Oral BID  . atorvastatin  20 mg Oral Daily  . Chlorhexidine Gluconate Cloth  6 each Topical Q0600  . Chlorhexidine Gluconate Cloth  6 each Topical Q0600  . donepezil  10 mg Oral QHS  . doxazosin  4 mg Oral Daily  . hydrALAZINE  100 mg Oral TID  . levothyroxine  150 mcg Oral QAC breakfast  . memantine  10 mg Oral BID  . vancomycin  500 mg Intravenous Q T,Th,Sa-HD   Continuous Infusions: . piperacillin-tazobactam (ZOSYN)  IV 2.25 g (08/01/19 1156)      LOS: 7 days   Charlynne Cousins  [facility]  08/01/2019, 2:14 PM

## 2019-08-01 NOTE — Progress Notes (Addendum)
Occupational Therapy Treatment Patient Details Name: Jeffrey Campbell MRN: [id number] DOB: 1932/08/10 Today's Date: 08/01/2019    History of present illness 86 yo admitted with increased confusion and fall at home with additional fall am of 4/29. Pt with encephalopathy and euvolemic. PMHx: dementia, complete heart block s/p PPM, pulm HTN, OSA, dementia, Graves dz, glaucoma, CAD, ESRD (TTS), PVD, BPH, Aflutter   OT comments  Pt making slow, steady progress towards acute OT goals, limited by dizziness once EOB and low bp. Originally planned OOB activity and up to recliner but limited to 2x sit<>stands and sitting EOB aobut 10 minutes. Of note, pt with BUE tremulous movements on initial stand with pt needing increased cueing to return to sitting position. Pt sat several minutes and vitals assessed before attempting standing a second time. Posterior lean in standing. Pt endorsing dizziness during EOB/standing. Vitals assessed as followed: sitting after initial stand: bp 95/50 MAP 104, second stand bp 78/46 MAP 56, and after returning to supine: bp 99/68 MAP 77; HR 70, O2 in the 90s. Nursing notified. D/c plan remains appropriate.      Follow Up Recommendations  SNF;Supervision/Assistance - 24 hour;Other (comment)(pending family support)    Equipment Recommendations  3 in 1 bedside commode    Recommendations for Other Services      Precautions / Restrictions Precautions Precautions: Fall Precaution Comments: incontinent Restrictions Weight Bearing Restrictions: No       Mobility Bed Mobility Overal bed mobility: Needs Assistance Bed Mobility: Supine to Sit;Sit to Supine     Supine to sit: Min guard Sit to supine: Min guard   General bed mobility comments: use of rail; increased time and effort; min guard for safety. some cues for sequencing  Transfers Overall transfer level: Needs assistance Equipment used: Rolling walker (2 wheeled) Transfers: Sit to/from Stand Sit to Stand: Min  assist;Mod assist         General transfer comment: assist to stabilize rw, steady pt. mod A at pt fatigued and posterior lean. mod A to control descent into sitting.     Balance Overall balance assessment: Needs assistance;History of Falls Sitting-balance support: Feet supported;Bilateral upper extremity supported Sitting balance-Leahy Scale: Fair Sitting balance - Comments: tends to sit in BUE support position. + dizziness sitting EOB Postural control: Posterior lean Standing balance support: Bilateral upper extremity supported;During functional activity Standing balance-Leahy Scale: Poor Standing balance comment: rw and min-mod A in standing. Posterior lean with pt having difficulty correcting. Pt endoring dizziness and noted to have low bp reading.                            ADL either performed or assessed with clinical judgement   ADL Overall ADL's : Needs assistance/impaired Eating/Feeding: Set up;Sitting;Cueing for sequencing   Grooming: Wash/dry hands;Minimal assistance;Bed level;Set up Grooming Details (indicate cue type and reason): min A for throughness.                                General ADL Comments: Bed mobility and 2x sit<>stands from EOB this session. Session limited by onset of dizziness and low bp.      Vision       Perception     Praxis      Cognition Arousal/Alertness: Awake/alert Behavior During Therapy: Flat affect Overall Cognitive Status: History of cognitive impairments - at baseline  General Comments: h/o dementia at baseline. able to state name, DOB, and place.         Exercises     Shoulder Instructions       General Comments      Pertinent Vitals/ Pain       Pain Assessment: Faces Faces Pain Scale: Hurts little more Pain Location: unspecified Pain Descriptors / Indicators: Grimacing Pain Intervention(s): Monitored during session  Home Living                                           Prior Functioning/Environment              Frequency  Min 2X/week        Progress Toward Goals  OT Goals(current goals can now be found in the care plan section)  Progress towards OT goals: Progressing toward goals(limited by dizziness and low bp this session)  Acute Rehab OT Goals OT Goal Formulation: Patient unable to participate in goal setting Time For Goal Achievement: 08/09/19 Potential to Achieve Goals: Fair ADL Goals Pt Will Perform Grooming: with min guard assist;standing Pt Will Perform Lower Body Bathing: with min guard assist;sit to/from stand;sitting/lateral leans Pt Will Perform Lower Body Dressing: with min guard assist;sitting/lateral leans;sit to/from stand Pt Will Transfer to Toilet: with min guard assist;ambulating;regular height toilet;grab bars  Plan Discharge plan remains appropriate    Co-evaluation    PT/OT/SLP Co-Evaluation/Treatment: Yes Reason for Co-Treatment: For patient/therapist safety;To address functional/ADL transfers   OT goals addressed during session: ADL's and self-care      AM-PAC OT "6 Clicks" Daily Activity     Outcome Measure   Help from another person eating meals?: A Little Help from another person taking care of personal grooming?: A Little Help from another person toileting, which includes using toliet, bedpan, or urinal?: A Lot Help from another person bathing (including washing, rinsing, drying)?: A Lot Help from another person to put on and taking off regular upper body clothing?: A Little Help from another person to put on and taking off regular lower body clothing?: A Lot 6 Click Score: 15    End of Session Equipment Utilized During Treatment: Rolling walker  OT Visit Diagnosis: Unsteadiness on feet (R26.81);Other abnormalities of gait and mobility (R26.89);Muscle weakness (generalized) (M62.81);Other symptoms and signs involving cognitive function   Activity  Tolerance Other (comment)(+dizzy EOB and in standing, low bp noted)   Patient Left in bed;with call bell/phone within reach;with bed alarm set;with nursing/sitter in room   Nurse Communication Other (comment)(tremulous BUE movement on intital stand, low bp, dizzy EOB)        Time: 5093-2671 OT Time Calculation (min): 42 min  Charges: OT General Charges $OT Visit: 1 Visit OT Treatments $Self Care/Home Management : 8-22 mins  Tyrone Schimke, OT Acute Rehabilitation Services Pager: [phone] Office: [phone]    Jeffrey Campbell 08/01/2019, 10:58 AM

## 2019-08-02 DIAGNOSIS — I4892 Unspecified atrial flutter: Secondary | ICD-10-CM | POA: Diagnosis not present

## 2019-08-02 DIAGNOSIS — R4182 Altered mental status, unspecified: Secondary | ICD-10-CM | POA: Diagnosis not present

## 2019-08-02 DIAGNOSIS — N186 End stage renal disease: Secondary | ICD-10-CM | POA: Diagnosis not present

## 2019-08-02 DIAGNOSIS — J189 Pneumonia, unspecified organism: Principal | ICD-10-CM

## 2019-08-02 DIAGNOSIS — Z992 Dependence on renal dialysis: Secondary | ICD-10-CM | POA: Diagnosis not present

## 2019-08-02 MED ORDER — AZITHROMYCIN 500 MG PO TABS
500.0000 mg | ORAL_TABLET | Freq: Every day | ORAL | 0 refills | Status: AC
Start: 2019-08-02 — End: 2019-08-04

## 2019-08-02 MED ORDER — AMOXICILLIN-POT CLAVULANATE 875-125 MG PO TABS: 1.0000 | ORAL_TABLET | Freq: Two times a day (BID) | ORAL | 0 refills | Status: AC

## 2019-08-02 NOTE — Consult Note (Signed)
   [facility] CM Inpatient Consult   08/02/2019  Jeffrey Campbell Dec 11, 1932 [id number]   [facility] ACO patient: Medicare NextGen  Patient screened for length of stay day 8 hospitalization to check if potential [facility] service needs. Review of patient's medical record reveals patient is active prior to admission with [facility] Palliative program. Physical Therapy notes recommends home with home health. Patient with [facility]  Primary Care Provider is [name], MD currently, did review inpatient [facility] RNCM notes regarding wife's desire for [facility] Administration follow up needs well.  Plan:  Patient will have General Discharge EMMI currently.  For questions contact:   Natividad Brood, RN BSN [facility] Liaison  [phone] business mobile phone Toll free office [phone]  Fax number: [phone] [email or web address]

## 2019-08-02 NOTE — Discharge Summary (Signed)
Physician Discharge Summary  Jeffrey Campbell KYH:[id number] DOB: [birth year] DOA: 07/25/2019  PCP: [name], MD  Admit date: 07/25/2019 Discharge date: 08/02/2019  Admitted From: Home isposition:  Home  Recommendations for Outpatient Follow-up:  1. Follow up with PCP in 1-2 weeks   Home Health:Yes Equipment/Devices:None  Discharge Condition:Stable CODE STATUS:Full [name]  Brief/Interim Summary: 86 y.o. male past medical history of complete heart block status post pacemaker atrial flutter on Eliquis dementia obstructive sleep apnea chronic diastolic heart failure pulmonary hypertension presents to the ED with altered mental status.  Discharge Diagnoses:  Principal Problem:   Acute metabolic encephalopathy Active Problems:   OSA (obstructive sleep apnea)   Hypothyroidism   Anemia of chronic disease   Hypertension   ESRD (end stage renal disease) (HCC)   Elevated troponin   Delirium   Palliative care encounter  Acute metabolic encephalopathy in the setting of dementia likely multifactorial due left lower lobe pneumonia CT scan of the head showed no acute findings UDS and UA were negative, he was started empirically on IV antibiotics and his encephalopathy improved. He was transitioned to oral Augmentin and azithromycin which she will continue for 2 additional days and outpatient. Culture data has remained negative till date.  Elevated troponins: Seen by cardiology they recommended no further work-up likely demand ischemia.  Prolonged QTC: Try to keep magnesium greater than 2 potassium greater than 4 8 remained stable.  Severe pulmonary hypertension: Continue current regimen no changes made.  Atrial flutter status  Post DCCV 11.12.2021: Rate controlled continue Eliquis.  Hypothyroidism: Continue Synthroid.  End-stage renal disease on hemodialysis continue hemodialysis per renal.  Isolated elevated T bili: Likely due to global syndromes.  Discharge  Instructions  Discharge Instructions    Diet - low sodium heart healthy   Complete by: As directed    Increase activity slowly   Complete by: As directed      Allergies as of 08/02/2019      Reactions   Iodinated Diagnostic Agents Other (See Comments)   Right heart cath 07/2016 allegedly "shut down his kidneys" Patient has stage III chronic kidney disease   Zocor [simvastatin] Other (See Comments)   Liver problems    Budesonide-formoterol Fumarate Other (See Comments)   Dry mouth   Minoxidil Other (See Comments)   Unknown raction   Tiotropium Bromide Monohydrate Other (See Comments)   Dry mouth   Tricor [fenofibrate]    Unknown reaction      Medication List    TAKE these medications   amLODipine 10 MG tablet Commonly known as: NORVASC Take 1 tablet (10 mg total) by mouth daily.   amoxicillin-clavulanate 875-125 MG tablet Commonly known as: Augmentin Take 1 tablet by mouth 2 (two) times daily for 2 days.   apixaban 2.5 MG Tabs tablet Commonly known as: ELIQUIS Take 1 tablet (2.5 mg total) by mouth 2 (two) times daily.   atorvastatin 20 MG tablet Commonly known as: LIPITOR Take 1 tablet (20 mg total) by mouth daily.   azithromycin 500 MG tablet Commonly known as: Zithromax Take 1 tablet (500 mg total) by mouth daily for 2 days. Take 1 tablet daily for 3 days.   cloNIDine 0.2 MG tablet Commonly known as: CATAPRES Take 1.5 tablets (0.3 mg total) by mouth 2 (two) times daily.   donepezil 10 MG tablet Commonly known as: ARICEPT Take 1 tablet (10 mg total) by mouth at bedtime.   doxazosin 4 MG tablet Commonly known as: CARDURA Take 1 tablet (4 mg  total) by mouth daily. What changed: when to take this   hydrALAZINE 100 MG tablet Commonly known as: APRESOLINE Take 1 tablet (100 mg total) by mouth 3 (three) times daily. Please make yearly appt with Dr. Radford Pax for October. 1st attempt   levothyroxine 150 MCG tablet Commonly known as: SYNTHROID Take 150 mcg by  mouth daily before breakfast.   memantine 10 MG tablet Commonly known as: NAMENDA Take 1 tablet (10 mg total) by mouth 2 (two) times daily.   PROCRIT IJ Inject 5,000 Units as directed every 14 (fourteen) days.   Simbrinza 1-0.2 % Susp Generic drug: Brinzolamide-Brimonidine Place 1 drop into both eyes 2 (two) times daily.   torsemide 20 MG tablet Commonly known as: DEMADEX TAKE 4 TABLETS (80 MG TOTAL) BY MOUTH DAILY.   triamcinolone ointment 0.5 % Commonly known as: KENALOG Apply 1 application topically 2 (two) times daily as needed (rash).            Durable Medical Equipment  (From admission, onward)         Start     Ordered   07/27/19 0522  For home use only DME Walker rolling  Once    Question Answer Comment  Walker: With 5 Inch Wheels   Patient needs a walker to treat with the following condition Ambulatory dysfunction      07/27/19 0521          Allergies  Allergen Reactions  . Iodinated Diagnostic Agents Other (See Comments)    Right heart cath 07/2016 allegedly "shut down his kidneys" Patient has stage III chronic kidney disease  . Zocor [Simvastatin] Other (See Comments)    Liver problems   . Budesonide-Formoterol Fumarate Other (See Comments)    Dry mouth  . Minoxidil Other (See Comments)    Unknown raction  . Tiotropium Bromide Monohydrate Other (See Comments)    Dry mouth  . Tricor [Fenofibrate]     Unknown reaction    Consultations:  None   Procedures/Studies: DG Chest 2 View  Result Date: 07/25/2019 CLINICAL DATA:  Altered mental status EXAM: CHEST - 2 VIEW COMPARISON:  02/10/2019 FINDINGS: RIGHT-sided dual lead pacer device remains in place. Cardiomediastinal contours with persistent cardiac enlargement. Central vascular engorgement with possible signs of interstitial edema. No dense consolidation. No sign of pleural effusion. Osteopenia and mild kyphosis. Limited assessment of skeletal structures otherwise unremarkable. IMPRESSION:  Cardiomegaly with central vascular engorgement and possible signs of interstitial edema. Electronically Signed   By: Zetta Bills M.D.   On: 07/25/2019 11:12   CT Head Wo Contrast  Result Date: 07/25/2019 CLINICAL DATA:  Altered status with increase in disorientation EXAM: CT HEAD WITHOUT CONTRAST TECHNIQUE: Contiguous axial images were obtained from the base of the skull through the vertex without intravenous contrast. COMPARISON:  September 01, 2018 FINDINGS: Brain: There is stable moderate diffuse atrophy. There is no appreciable intracranial mass, hemorrhage, extra-axial fluid collection, or midline shift. There is small vessel disease throughout the centra semiovale bilaterally, similar in appearance compared to most recent study. No acute appearing infarct is demonstrable on this study. Vascular: There is no hyperdense vessel. There is calcification in each distal vertebral artery and carotid siphon region. Skull: The bony calvarium appears intact. Sinuses/Orbits: There is mucosal thickening in each inferior maxillary antrum. There is mucosal thickening in several ethmoid air cells. Orbits appear symmetric bilaterally. Other: Mastoid air cells are clear. IMPRESSION: Stable atrophy with periventricular small vessel disease. No acute infarct. No mass or hemorrhage. There are  foci of arterial vascular calcification. Areas of paranasal sinus disease also noted. Electronically Signed   By: Lowella Grip III M.D.   On: 07/25/2019 12:30   DG CHEST PORT 1 VIEW  Result Date: 07/30/2019 CLINICAL DATA:  Abnormal physical examination.  [place]ales. EXAM: PORTABLE CHEST 1 VIEW COMPARISON:  PA and lateral chest 02/10/2019 and 07/25/2019. Single-view of the chest 09/07/2018. CT chest 05/12/2016. FINDINGS: There is left basilar airspace disease could be due to pneumonia. The right lung is clear. Heart size is normal. Atherosclerosis noted. No pneumothorax or pleural effusion. Pacing device is seen as on the more recent  comparisons. IMPRESSION: Left basilar airspace disease worrisome for pneumonia. Aortic Atherosclerosis (ICD10-I70.0). Electronically Signed   By: Inge Rise M.D.   On: 07/30/2019 11:24   VAS US DUPLEX DIALYSIS ACCESS (AVF, AVG)  Result Date: 07/29/2019 DIALYSIS ACCESS Reason for Exam: Unable to dialyze through AVF/AVG. Access Site: Left Upper Extremity. Access Type: Brachial-cephalic AVF. Limitations: Patient body habitus, patient positioning, patient movement, poor              patient cooperation. Comparison Study: No prior studies. Performing Technologist: Oliver Hum RVT  Examination Guidelines: A complete evaluation includes B-mode imaging, spectral Doppler, color Doppler, and power Doppler as needed of all accessible portions of each vessel. Unilateral testing is considered an integral part of a complete examination. Limited examinations for reoccurring indications may be performed as noted.  Findings:    Summary: Probable competing branch noted in the mid segment of the AVF. Cannot rule out possible pseudoaneurysm within the mid segment of the AVF. The blood-flow is inadequate to evaluate waveforms. No obvious evidence of stenosis within the AVF.  *See table(s) above for measurements and observations.  Diagnosing physician: Monica Martinez MD Electronically signed by Monica Martinez MD on 07/29/2019 at 11:00:25 AM.   --------------------------------------------------------------------------------   Final      Subjective: No complaints.   Discharge Exam: Vitals:   08/02/19 0420 08/02/19 0945  BP: (!) 133/106 (!) 122/104  Pulse: 69 67  Resp: 17 18  Temp: 98.5 F (36.9 C) 97.7 F (36.5 C)  SpO2: 97% 97%   Vitals:   08/01/19 2020 08/02/19 0025 08/02/19 0420 08/02/19 0945  BP: (!) 127/53 (!) 124/55 (!) 133/106 (!) 122/104  Pulse: 70 70 69 67  Resp: 18 19 17 18   Temp: 98.6 F (37 C) 98.4 F (36.9 C) 98.5 F (36.9 C) 97.7 F (36.5 C)  TempSrc: Oral Oral Oral Oral  SpO2:  99% 98% 97% 97%  Weight:      Height:        General: Pt is alert, awake, not in acute distress Cardiovascular: RRR, S1/S2 +, no rubs, no gallops Respiratory: CTA bilaterally, no wheezing, no rhonchi Abdominal: Soft, NT, ND, bowel sounds + Extremities: no edema, no cyanosis    The results of significant diagnostics from this hospitalization (including imaging, microbiology, ancillary and laboratory) are listed below for reference.     Microbiology: Recent Results (from the past 240 hour(s))  Respiratory Panel by RT PCR (Flu A&B, Covid) - Nasopharyngeal Swab     Status: None   Collection Time: 07/25/19 12:36 PM   Specimen: Nasopharyngeal Swab  Result Value Ref Range Status   SARS Coronavirus 2 by RT PCR NEGATIVE NEGATIVE Final    Comment: (NOTE) SARS-CoV-2 target nucleic acids are NOT DETECTED. The SARS-CoV-2 RNA is generally detectable in upper respiratoy specimens during the acute phase of infection. The lowest concentration of SARS-CoV-2 viral copies this  assay can detect is 131 copies/mL. A negative result does not preclude SARS-Cov-2 infection and should not be used as the sole basis for treatment or other patient management decisions. A negative result may occur with  improper specimen collection/handling, submission of specimen other than nasopharyngeal swab, presence of viral mutation(s) within the areas targeted by this assay, and inadequate number of viral copies (<131 copies/mL). A negative result must be combined with clinical observations, patient history, and epidemiological information. The expected result is Negative. Fact Sheet for Patients:  PinkCheek.be Fact Sheet for Healthcare Providers:  GravelBags.it This test is not yet ap proved or cleared by the [place] FDA and  has been authorized for detection and/or diagnosis of SARS-CoV-2 by FDA under an Emergency Use Authorization (EUA). This EUA  will remain  in effect (meaning this test can be used) for the duration of the COVID-19 declaration under Section 564(b)(1) of the Act, 21 U.S.C. section 360bbb-3(b)(1), unless the authorization is terminated or revoked sooner.    Influenza A by PCR NEGATIVE NEGATIVE Final   Influenza B by PCR NEGATIVE NEGATIVE Final    Comment: (NOTE) The Xpert Xpress SARS-CoV-2/FLU/RSV assay is intended as an aid in  the diagnosis of influenza from Nasopharyngeal swab specimens and  should not be used as a sole basis for treatment. Nasal washings and  aspirates are unacceptable for Xpert Xpress SARS-CoV-2/FLU/RSV  testing. Fact Sheet for Patients: PinkCheek.be Fact Sheet for Healthcare Providers: GravelBags.it This test is not yet approved or cleared by the [place] FDA and  has been authorized for detection and/or diagnosis of SARS-CoV-2 by  FDA under an Emergency Use Authorization (EUA). This EUA will remain  in effect (meaning this test can be used) for the duration of the  Covid-19 declaration under Section 564(b)(1) of the Act, 21  U.S.C. section 360bbb-3(b)(1), unless the authorization is  terminated or revoked. Performed at [facility], [place] [address] [place], [place] [address]   MRSA PCR Screening     Status: None   Collection Time: 07/25/19 10:06 PM   Specimen: Nasal Mucosa; Nasopharyngeal  Result Value Ref Range Status   MRSA by PCR NEGATIVE NEGATIVE Final    Comment:        The GeneXpert MRSA Assay (FDA approved for NASAL specimens only), is one component of a comprehensive MRSA colonization surveillance program. It is not intended to diagnose MRSA infection nor to guide or monitor treatment for MRSA infections. Performed at [facility], [place] [address], [place], [place] [address]   Culture, blood (routine x 2)     Status: None (Preliminary result)   Collection Time: 07/29/19  7:42 AM   Specimen:  BLOOD RIGHT ARM  Result Value Ref Range Status   Specimen Description BLOOD RIGHT ARM  Final   Special Requests   Final    BOTTLES DRAWN AEROBIC AND ANAEROBIC Blood Culture adequate volume   Culture   Final    NO GROWTH 4 DAYS Performed at [facility], [place] [address] [place], [place] [address]    Report Status PENDING  Incomplete  Culture, blood (routine x 2)     Status: None (Preliminary result)   Collection Time: 07/29/19  7:43 AM   Specimen: BLOOD RIGHT HAND  Result Value Ref Range Status   Specimen Description BLOOD RIGHT HAND  Final   Special Requests   Final    BOTTLES DRAWN AEROBIC AND ANAEROBIC Blood Culture adequate volume   Culture  Final    NO GROWTH 4 DAYS Performed at [facility], [place] [address], [place], [place] [address]    Report Status PENDING  Incomplete     Labs: BNP (last 3 results) No results for input(s): BNP in the last 8760 hours. Basic Metabolic Panel: Recent Labs  Lab 07/28/19 0623 07/29/19 0743 07/30/19 0317 07/31/19 0355 08/01/19 0510  NA 140 142 143 139 135  K 3.7 4.2 4.3 4.1 3.3*  CL 103 100 103 100 93*  CO2 26 26 24  18* 28  GLUCOSE 116* 143* 148* 154* 131*  BUN 50* 40* 68* 94* 38*  CREATININE 4.65* 4.07* 5.60* 7.22* 4.60*  CALCIUM 10.6* 10.8* 10.6* 10.1 9.3  MG  --  2.1  --   --   --   PHOS 3.6  --   --   --   --    Liver Function Tests: Recent Labs  Lab 07/27/19 0654 07/28/19 0623  AST 27 31  ALT 12 13  ALKPHOS 64 70  BILITOT 1.3* 1.4*  PROT 7.1 7.2  ALBUMIN 3.5 3.7   No results for input(s): LIPASE, AMYLASE in the last 168 hours. No results for input(s): AMMONIA in the last 168 hours. CBC: Recent Labs  Lab 07/28/19 0623 07/29/19 0743 07/30/19 0317 07/31/19 0355 08/01/19 0510  WBC 12.9* 13.4* 11.3* 10.6* 8.7  NEUTROABS 11.5*  --   --   --   --   HGB 9.5* 11.2* 10.4* 9.7* 8.8*  HCT 30.3* 35.7* 32.9* 30.9* 27.3*  MCV 97.1 95.5 96.8 98.7 96.5  PLT 158 204 197 190 194   Cardiac Enzymes: No results  for input(s): CKTOTAL, CKMB, CKMBINDEX, TROPONINI in the last 168 hours. BNP: Invalid input(s): POCBNP CBG: Recent Labs  Lab 07/28/19 1708  GLUCAP 130*   D-Dimer No results for input(s): DDIMER in the last 72 hours. Hgb A1c No results for input(s): HGBA1C in the last 72 hours. Lipid Profile No results for input(s): CHOL, HDL, LDLCALC, TRIG, CHOLHDL, LDLDIRECT in the last 72 hours. Thyroid function studies No results for input(s): TSH, T4TOTAL, T3FREE, THYROIDAB in the last 72 hours.  Invalid input(s): FREET3 Anemia work up No results for input(s): VITAMINB12, FOLATE, FERRITIN, TIBC, IRON, RETICCTPCT in the last 72 hours. Urinalysis    Component Value Date/Time   COLORURINE YELLOW 07/25/2019 1116   APPEARANCEUR HAZY (A) 07/25/2019 1116   LABSPEC 1.019 07/25/2019 1116   PHURINE 5.0 07/25/2019 1116   GLUCOSEU NEGATIVE 07/25/2019 1116   HGBUR NEGATIVE 07/25/2019 1116   [place] 07/25/2019 1116   KETONESUR NEGATIVE 07/25/2019 1116   PROTEINUR 100 (A) 07/25/2019 1116   NITRITE NEGATIVE 07/25/2019 1116   LEUKOCYTESUR NEGATIVE 07/25/2019 1116   Sepsis Labs Invalid input(s): PROCALCITONIN,  WBC,  LACTICIDVEN Microbiology Recent Results (from the past 240 hour(s))  Respiratory Panel by RT PCR (Flu A&B, Covid) - Nasopharyngeal Swab     Status: None   Collection Time: 07/25/19 12:36 PM   Specimen: Nasopharyngeal Swab  Result Value Ref Range Status   SARS Coronavirus 2 by RT PCR NEGATIVE NEGATIVE Final    Comment: (NOTE) SARS-CoV-2 target nucleic acids are NOT DETECTED. The SARS-CoV-2 RNA is generally detectable in upper respiratoy specimens during the acute phase of infection. The lowest concentration of SARS-CoV-2 viral copies this assay can detect is 131 copies/mL. A negative result does not preclude SARS-Cov-2 infection and should not be used as the sole basis for treatment or other patient management decisions. A negative result may occur  with  improper  specimen collection/handling, submission of specimen other than nasopharyngeal swab, presence of viral mutation(s) within the areas targeted by this assay, and inadequate number of viral copies (<131 copies/mL). A negative result must be combined with clinical observations, patient history, and epidemiological information. The expected result is Negative. Fact Sheet for Patients:  PinkCheek.be Fact Sheet for Healthcare Providers:  GravelBags.it This test is not yet ap proved or cleared by the [place] FDA and  has been authorized for detection and/or diagnosis of SARS-CoV-2 by FDA under an Emergency Use Authorization (EUA). This EUA will remain  in effect (meaning this test can be used) for the duration of the COVID-19 declaration under Section 564(b)(1) of the Act, 21 U.S.C. section 360bbb-3(b)(1), unless the authorization is terminated or revoked sooner.    Influenza A by PCR NEGATIVE NEGATIVE Final   Influenza B by PCR NEGATIVE NEGATIVE Final    Comment: (NOTE) The Xpert Xpress SARS-CoV-2/FLU/RSV assay is intended as an aid in  the diagnosis of influenza from Nasopharyngeal swab specimens and  should not be used as a sole basis for treatment. Nasal washings and  aspirates are unacceptable for Xpert Xpress SARS-CoV-2/FLU/RSV  testing. Fact Sheet for Patients: PinkCheek.be Fact Sheet for Healthcare Providers: GravelBags.it This test is not yet approved or cleared by the [place] FDA and  has been authorized for detection and/or diagnosis of SARS-CoV-2 by  FDA under an Emergency Use Authorization (EUA). This EUA will remain  in effect (meaning this test can be used) for the duration of the  Covid-19 declaration under Section 564(b)(1) of the Act, 21  U.S.C. section 360bbb-3(b)(1), unless the authorization is  terminated or revoked. Performed at [facility], [place] [address], [place], [place] [address]   MRSA PCR Screening     Status: None   Collection Time: 07/25/19 10:06 PM   Specimen: Nasal Mucosa; Nasopharyngeal  Result Value Ref Range Status   MRSA by PCR NEGATIVE NEGATIVE Final    Comment:        The GeneXpert MRSA Assay (FDA approved for NASAL specimens only), is one component of a comprehensive MRSA colonization surveillance program. It is not intended to diagnose MRSA infection nor to guide or monitor treatment for MRSA infections. Performed at [facility], [place] [address], [place], [place] [address]   Culture, blood (routine x 2)     Status: None (Preliminary result)   Collection Time: 07/29/19  7:42 AM   Specimen: BLOOD RIGHT ARM  Result Value Ref Range Status   Specimen Description BLOOD RIGHT ARM  Final   Special Requests   Final    BOTTLES DRAWN AEROBIC AND ANAEROBIC Blood Culture adequate volume   Culture   Final    NO GROWTH 4 DAYS Performed at [facility], [place] [address], [place], [place] [address]    Report Status PENDING  Incomplete  Culture, blood (routine x 2)     Status: None (Preliminary result)   Collection Time: 07/29/19  7:43 AM   Specimen: BLOOD RIGHT HAND  Result Value Ref Range Status   Specimen Description BLOOD RIGHT HAND  Final   Special Requests   Final    BOTTLES DRAWN AEROBIC AND ANAEROBIC Blood Culture adequate volume   Culture   Final    NO GROWTH 4 DAYS Performed at [facility], [place] [address], [place], [place] [address]    Report Status PENDING  Incomplete     Time coordinating  discharge: Over 30 minutes  SIGNED:   Charlynne Cousins, MD  [facility] 08/02/2019, 10:32 AM Pager   If 7PM-7AM, please contact night-coverage [email or web address] Password [email or web address]

## 2019-08-02 NOTE — TOC Transition Note (Signed)
Transition of Care [facility]) - CM/SW Discharge Note   Patient Details  Name: Jeffrey Campbell MRN: [id number] Date of Birth: 18-Sep-1932  [facility] CM/SW Contact:  Pollie Friar, RN Phone Number: 08/02/2019, 11:46 AM   Clinical Narrative:    Pt discharging home with [facility] services and palliative care. Information on his AVS.  Daughter to provide transport home.    Final next level of care: Home w Home Health Services Barriers to Discharge: No Barriers Identified   Patient Goals and CMS Choice   CMS Medicare.gov Compare Post Acute Care list provided to:: Patient Represenative (must comment) Choice offered to / list presented to : Adult Children  Discharge Placement                       Discharge Plan and Services   Discharge Planning Services: CM Consult Post Acute Care Choice: Home Health, Durable Medical Equipment          DME Arranged: Walker rolling DME Agency: [employer] Date DME Agency Contacted: 07/27/19   Representative spoke with at DME Agency: [place]: PT, OT, RN, Nurse's Aide, Social Work [employer] Agency: [place] ([place]) Date [place]: 08/02/19   Representative spoke with at [place]: Butch Penny made aware of d/c.  Social Determinants of Health (SDOH) Interventions     Readmission Risk Interventions No flowsheet data found.

## 2019-08-02 NOTE — Progress Notes (Signed)
Patient ID: Jeffrey Campbell, male   DOB: 06/11/1932, 86 y.o.   MRN: [id number]  [facility] Progress Note    Subjective:   Seen in room, no new c/o   Objective:   BP (!) 122/104 (BP Location: Right Arm)   Pulse 67   Temp 97.7 F (36.5 C) (Oral)   Resp 18   Ht 5\' 8"  (1.727 m)   Wt 52.6 kg   SpO2 97%   BMI 17.64 kg/m   OP HD:  TTS > MWF [facility], has only had 2 sessions so far (4/23, 4/26) and was supposed to switch to MWF on Monday 5/3   2.5 hr  BFR 250 2K/2Ca   LUA  AVF   Hep none  - time/BFR being advanced as new patient   - No venofer or ESA yet, Hgb there 11.1 - Calcitriol 0.69mcg PO q HD     Physical Exam: Gen: awake and alert CVS: Pulse regular rhythm, normal rate, 2/6 ejection systolic murmur Resp: Clear bilat Abd: Soft, flat, nontender, bowel sounds normal Ext: No lower extremity edema, left brachiocephalic fistula is pulsatile with some ecchymosis from recent infiltration Neuro: pt in mittens, confused and minimally verbal  Assessment/ Plan:   1.  Altered mental status in patient with underlying dementia: much better, fully alert and interactive after getting IV abx for PNA. No probs w/ his HD yesterday.  2. PNA  - is on IV abx, LLL infiltrate on CXR. Per primary team.    3. ESRD: recent start was TTS (OP HD x 2). Has had HD x 4 here. Will get next OP HD tomorrow.  He is now MWF patient. OK for dc.  4. Anemia: With marginally low hemoglobin and hematocrit, monitor on ESA.  Hb 9.7- 10.5 here.  5. CKD-MBD: Hypercalcemia noted, continue low calcium dialysate.  Phosphorus at goal.   6. Nutrition: Continue renal diet with ongoing nutritional supplementation. 7. Hypertension: Blood pressures within acceptable range with ongoing medication/HD  Kelly Splinter, MD 07/30/2019, 12:00 PM  Labs: BMET Recent Labs  Lab 07/27/19 0654 07/28/19 2202 07/29/19 0743 07/30/19 0317 07/31/19 0355 08/01/19 0510  NA 138 140 142 143 139 135  K 3.9 3.7 4.2 4.3 4.1 3.3*  CL  102 103 100 103 100 93*  CO2 28 26 26 24  18* 28  GLUCOSE 143* 116* 143* 148* 154* 131*  BUN 35* 50* 40* 68* 94* 38*  CREATININE 3.96* 4.65* 4.07* 5.60* 7.22* 4.60*  CALCIUM 9.6 10.6* 10.8* 10.6* 10.1 9.3  PHOS  --  3.6  --   --   --   --    CBC Recent Labs  Lab 07/28/19 0623 07/28/19 0623 07/29/19 0743 07/30/19 0317 07/31/19 0355 08/01/19 0510  WBC 12.9*   < > 13.4* 11.3* 10.6* 8.7  NEUTROABS 11.5*  --   --   --   --   --   HGB 9.5*   < > 11.2* 10.4* 9.7* 8.8*  HCT 30.3*   < > 35.7* 32.9* 30.9* 27.3*  MCV 97.1   < > 95.5 96.8 98.7 96.5  PLT 158   < > 204 197 190 194   < > = values in this interval not displayed.     Medications:    . amLODipine  10 mg Oral Daily  . apixaban  2.5 mg Oral BID  . atorvastatin  20 mg Oral Daily  . Chlorhexidine Gluconate Cloth  6 each Topical Q0600  . Chlorhexidine Gluconate Cloth  6 each Topical Q0600  . donepezil  10 mg Oral QHS  . doxazosin  4 mg Oral Daily  . hydrALAZINE  100 mg Oral TID  . levothyroxine  150 mcg Oral QAC breakfast  . memantine  10 mg Oral BID  . vancomycin  500 mg Intravenous Q T,Th,Sa-HD

## 2019-08-03 ENCOUNTER — Telehealth: Payer: Self-pay

## 2019-08-03 ENCOUNTER — Telehealth: Payer: Self-pay | Admitting: Nurse Practitioner

## 2019-08-03 LAB — CULTURE, BLOOD (ROUTINE X 2)
Culture: NO GROWTH
Culture: NO GROWTH
Special Requests: ADEQUATE
Special Requests: ADEQUATE

## 2019-08-03 NOTE — Telephone Encounter (Signed)
If the family wants can schedule with AF clinic.

## 2019-08-03 NOTE — Telephone Encounter (Signed)
Spoke to [employer] ([facility]), states she would like to have patient followed by AF clinic. Will route to AF clinic.

## 2019-08-03 NOTE — Telephone Encounter (Signed)
Received [facility] alert 08/02/19 @ 10:44 PM for AF alert with controlled ventricular response. Currently taking Eliquis 2.5 mg BID, no missed doses.  Per daughter [name] [facility]) patient has been in the hospital for the past week for delirium/pneumonia. Patient has transitioned to palatitive care. Per daughter patient complains of fatigue. Manual transmission requested, received 08/03/19 @ 4:18 PM.  Presenting AF/ VP 70.

## 2019-08-04 DIAGNOSIS — E785 Hyperlipidemia, unspecified: Secondary | ICD-10-CM | POA: Diagnosis not present

## 2019-08-04 DIAGNOSIS — F0280 Dementia in other diseases classified elsewhere without behavioral disturbance: Secondary | ICD-10-CM | POA: Diagnosis not present

## 2019-08-04 DIAGNOSIS — N400 Enlarged prostate without lower urinary tract symptoms: Secondary | ICD-10-CM | POA: Diagnosis not present

## 2019-08-04 DIAGNOSIS — N186 End stage renal disease: Secondary | ICD-10-CM | POA: Diagnosis not present

## 2019-08-04 DIAGNOSIS — J440 Chronic obstructive pulmonary disease with acute lower respiratory infection: Secondary | ICD-10-CM | POA: Diagnosis not present

## 2019-08-04 DIAGNOSIS — I272 Pulmonary hypertension, unspecified: Secondary | ICD-10-CM | POA: Diagnosis not present

## 2019-08-04 DIAGNOSIS — G4733 Obstructive sleep apnea (adult) (pediatric): Secondary | ICD-10-CM | POA: Diagnosis not present

## 2019-08-04 DIAGNOSIS — I4892 Unspecified atrial flutter: Secondary | ICD-10-CM | POA: Diagnosis not present

## 2019-08-04 DIAGNOSIS — Z992 Dependence on renal dialysis: Secondary | ICD-10-CM | POA: Diagnosis not present

## 2019-08-04 DIAGNOSIS — I739 Peripheral vascular disease, unspecified: Secondary | ICD-10-CM | POA: Diagnosis not present

## 2019-08-04 DIAGNOSIS — E039 Hypothyroidism, unspecified: Secondary | ICD-10-CM | POA: Diagnosis not present

## 2019-08-04 DIAGNOSIS — J189 Pneumonia, unspecified organism: Secondary | ICD-10-CM | POA: Diagnosis not present

## 2019-08-04 DIAGNOSIS — G9341 Metabolic encephalopathy: Secondary | ICD-10-CM | POA: Diagnosis not present

## 2019-08-04 DIAGNOSIS — I0981 Rheumatic heart failure: Secondary | ICD-10-CM | POA: Diagnosis not present

## 2019-08-04 DIAGNOSIS — Z9181 History of falling: Secondary | ICD-10-CM | POA: Diagnosis not present

## 2019-08-04 DIAGNOSIS — Z950 Presence of cardiac pacemaker: Secondary | ICD-10-CM | POA: Diagnosis not present

## 2019-08-04 DIAGNOSIS — I132 Hypertensive heart and chronic kidney disease with heart failure and with stage 5 chronic kidney disease, or end stage renal disease: Secondary | ICD-10-CM | POA: Diagnosis not present

## 2019-08-04 DIAGNOSIS — R7303 Prediabetes: Secondary | ICD-10-CM | POA: Diagnosis not present

## 2019-08-04 DIAGNOSIS — I081 Rheumatic disorders of both mitral and tricuspid valves: Secondary | ICD-10-CM | POA: Diagnosis not present

## 2019-08-04 DIAGNOSIS — I442 Atrioventricular block, complete: Secondary | ICD-10-CM | POA: Diagnosis not present

## 2019-08-04 DIAGNOSIS — I2510 Atherosclerotic heart disease of native coronary artery without angina pectoris: Secondary | ICD-10-CM | POA: Diagnosis not present

## 2019-08-04 DIAGNOSIS — I5032 Chronic diastolic (congestive) heart failure: Secondary | ICD-10-CM | POA: Diagnosis not present

## 2019-08-04 DIAGNOSIS — D631 Anemia in chronic kidney disease: Secondary | ICD-10-CM | POA: Diagnosis not present

## 2019-08-04 DIAGNOSIS — Z7901 Long term (current) use of anticoagulants: Secondary | ICD-10-CM | POA: Diagnosis not present

## 2019-08-04 DIAGNOSIS — Z87891 Personal history of nicotine dependence: Secondary | ICD-10-CM | POA: Diagnosis not present

## 2019-08-05 DIAGNOSIS — I2510 Atherosclerotic heart disease of native coronary artery without angina pectoris: Secondary | ICD-10-CM | POA: Diagnosis not present

## 2019-08-05 DIAGNOSIS — I0981 Rheumatic heart failure: Secondary | ICD-10-CM | POA: Diagnosis not present

## 2019-08-05 DIAGNOSIS — J440 Chronic obstructive pulmonary disease with acute lower respiratory infection: Secondary | ICD-10-CM | POA: Diagnosis not present

## 2019-08-05 DIAGNOSIS — G9341 Metabolic encephalopathy: Secondary | ICD-10-CM | POA: Diagnosis not present

## 2019-08-05 DIAGNOSIS — J189 Pneumonia, unspecified organism: Secondary | ICD-10-CM | POA: Diagnosis not present

## 2019-08-05 DIAGNOSIS — I132 Hypertensive heart and chronic kidney disease with heart failure and with stage 5 chronic kidney disease, or end stage renal disease: Secondary | ICD-10-CM | POA: Diagnosis not present

## 2019-08-06 ENCOUNTER — Other Ambulatory Visit: Payer: Self-pay | Admitting: Cardiology

## 2019-08-06 DIAGNOSIS — Z992 Dependence on renal dialysis: Secondary | ICD-10-CM | POA: Diagnosis not present

## 2019-08-06 DIAGNOSIS — N186 End stage renal disease: Secondary | ICD-10-CM | POA: Diagnosis not present

## 2019-08-06 DIAGNOSIS — N2581 Secondary hyperparathyroidism of renal origin: Secondary | ICD-10-CM | POA: Diagnosis not present

## 2019-08-06 DIAGNOSIS — D509 Iron deficiency anemia, unspecified: Secondary | ICD-10-CM | POA: Diagnosis not present

## 2019-08-06 DIAGNOSIS — D631 Anemia in chronic kidney disease: Secondary | ICD-10-CM | POA: Diagnosis not present

## 2019-08-06 NOTE — Telephone Encounter (Signed)
Called and spoke with daughter, [name].  I offered a Virtual visit with A-Fib Clinic but daughter preferred in-person visit.  Pt scheduled 5/11 @3  pm and daughter aware.

## 2019-08-07 ENCOUNTER — Other Ambulatory Visit: Payer: Self-pay

## 2019-08-07 ENCOUNTER — Encounter (HOSPITAL_COMMUNITY): Payer: Self-pay | Admitting: Nurse Practitioner

## 2019-08-07 ENCOUNTER — Ambulatory Visit (HOSPITAL_COMMUNITY)
Admission: RE | Admit: 2019-08-07 | Discharge: 2019-08-07 | Disposition: A | Discharge status: Home / Self Care | Payer: Medicare Other | Source: Ambulatory Visit | Attending: Nurse Practitioner | Admitting: Nurse Practitioner

## 2019-08-07 VITALS — BP 120/60 | HR 70 | Ht 68.0 in | Wt 117.8 lb

## 2019-08-07 DIAGNOSIS — E785 Hyperlipidemia, unspecified: Secondary | ICD-10-CM | POA: Insufficient documentation

## 2019-08-07 DIAGNOSIS — E0500 Thyrotoxicosis with diffuse goiter without thyrotoxic crisis or storm: Secondary | ICD-10-CM | POA: Insufficient documentation

## 2019-08-07 DIAGNOSIS — Z79899 Other long term (current) drug therapy: Secondary | ICD-10-CM | POA: Diagnosis not present

## 2019-08-07 DIAGNOSIS — J189 Pneumonia, unspecified organism: Secondary | ICD-10-CM | POA: Diagnosis not present

## 2019-08-07 DIAGNOSIS — R7303 Prediabetes: Secondary | ICD-10-CM | POA: Insufficient documentation

## 2019-08-07 DIAGNOSIS — H409 Unspecified glaucoma: Secondary | ICD-10-CM | POA: Diagnosis not present

## 2019-08-07 DIAGNOSIS — D649 Anemia, unspecified: Secondary | ICD-10-CM | POA: Insufficient documentation

## 2019-08-07 DIAGNOSIS — I4892 Unspecified atrial flutter: Secondary | ICD-10-CM | POA: Diagnosis not present

## 2019-08-07 DIAGNOSIS — I129 Hypertensive chronic kidney disease with stage 1 through stage 4 chronic kidney disease, or unspecified chronic kidney disease: Secondary | ICD-10-CM | POA: Insufficient documentation

## 2019-08-07 DIAGNOSIS — E039 Hypothyroidism, unspecified: Secondary | ICD-10-CM | POA: Insufficient documentation

## 2019-08-07 DIAGNOSIS — N183 Chronic kidney disease, stage 3 unspecified: Secondary | ICD-10-CM | POA: Diagnosis not present

## 2019-08-07 DIAGNOSIS — G9341 Metabolic encephalopathy: Secondary | ICD-10-CM | POA: Diagnosis not present

## 2019-08-07 DIAGNOSIS — Z8249 Family history of ischemic heart disease and other diseases of the circulatory system: Secondary | ICD-10-CM | POA: Diagnosis not present

## 2019-08-07 DIAGNOSIS — J440 Chronic obstructive pulmonary disease with acute lower respiratory infection: Secondary | ICD-10-CM | POA: Diagnosis not present

## 2019-08-07 DIAGNOSIS — I2510 Atherosclerotic heart disease of native coronary artery without angina pectoris: Secondary | ICD-10-CM | POA: Diagnosis not present

## 2019-08-07 DIAGNOSIS — I132 Hypertensive heart and chronic kidney disease with heart failure and with stage 5 chronic kidney disease, or end stage renal disease: Secondary | ICD-10-CM | POA: Diagnosis not present

## 2019-08-07 DIAGNOSIS — I4891 Unspecified atrial fibrillation: Secondary | ICD-10-CM | POA: Insufficient documentation

## 2019-08-07 DIAGNOSIS — Z7901 Long term (current) use of anticoagulants: Secondary | ICD-10-CM | POA: Insufficient documentation

## 2019-08-07 DIAGNOSIS — D6869 Other thrombophilia: Secondary | ICD-10-CM

## 2019-08-07 DIAGNOSIS — I0981 Rheumatic heart failure: Secondary | ICD-10-CM | POA: Diagnosis not present

## 2019-08-07 DIAGNOSIS — Z87891 Personal history of nicotine dependence: Secondary | ICD-10-CM | POA: Insufficient documentation

## 2019-08-07 NOTE — Progress Notes (Signed)
Primary Care Physician: [name], MD Referring Physician: Device clinic   Jeffrey Campbell is a 86 y.o. male with a h/o CAD, HTN, HLD, OSA and recent hospitalization, 08/02/19  for acute metabolic encephalopathy in the setting of dementia likely multifactorial due to left lower lobe pneumonia. Pt  was discharged to [facility] care.The device clinic saw some rate controlled afib at 70 bpm, while the pt was in the hospital and the pt was referred here.   He is here now with his daughter who is visiting. The pt lives with his wife. The pt cannot contribute to his history. He does acknowledge that he does not feel any heart racing. He is already on eliquis 2.5 mg bid for a CHA2DS2VASc of at least 5, as he does have h/o atrial flutter. Recent pneumonia may have contributed to it. He is v paced today at 70 bpm per minute. He has had controlled v rates.   Today, he denies symptoms of palpitations, chest pain, shortness of breath, orthopnea, PND, lower extremity edema, dizziness, presyncope, syncope, or neurologic sequela. The patient is tolerating medications without difficulties and is otherwise without complaint today.   Past Medical History:  Diagnosis Date  . Anemia    low iron  . BPH (benign prostatic hyperplasia)   . CAD (coronary artery disease)   . Carotid artery stenosis    1-39% bilateral carotid artery stenosis and < 50% stenosis in the right CCA by dopplers 06/2017  . CKD (chronic kidney disease), stage III    Stage 4  . Diastolic dysfunction   . Glaucoma   . Graves disease   . History of ETOH abuse   . Hyperlipemia   . Hypertension   . Hyperthyroidism 08/26/10   radioactive iodine therapy   . Memory loss   . Mitral regurgitation echo 2015   mild  . Multiple thyroid nodules   . MVP (mitral valve prolapse) 11/2012   posterior MVP  . OSA (obstructive sleep apnea)    upper airway resistance syndrome with RDI 18/hr - not on CPAP due to insurance not covering  . Pre-diabetes     . PUD (peptic ulcer disease)   . Pulmonary hypertension ([place]) echo 2015   Group 2 with pulmonary venous HTN and Group 3 with OSA  . Upper airway resistance syndrome    Past Surgical History:  Procedure Laterality Date  . APPENDECTOMY    . AV FISTULA PLACEMENT Left 10/18/2017   Procedure: ARTERIOVENOUS (AV) FISTULA CREATION ARM;  Surgeon: Waynetta Sandy, MD;  Location: [facility];  Service: Vascular;  Laterality: Left;  . CARDIAC CATHETERIZATION    . CARDIAC CATHETERIZATION N/A 02/17/2015   Procedure: Right Heart Cath;  Surgeon: Larey Dresser, MD;  Location: [facility];  Service: Cardiovascular;  Laterality: N/A;  . CARDIOVERSION N/A 02/08/2019   Procedure: CARDIOVERSION;  Surgeon: Donato Heinz, MD;  Location: [facility] ENDOSCOPY;  Service: Endoscopy;  Laterality: N/A;  . ESOPHAGOGASTRODUODENOSCOPY (EGD) WITH PROPOFOL Left 10/16/2016   Procedure: ESOPHAGOGASTRODUODENOSCOPY (EGD) WITH PROPOFOL;  Surgeon: Ronnette Juniper, MD;  Location: [facility];  Service: Gastroenterology;  Laterality: Left;  . heart catherization    . HERNIA REPAIR  10/2009  . IR RADIOLOGIST EVAL & MGMT  09/28/2016  . IR RADIOLOGIST EVAL & MGMT  10/19/2016  . IR RADIOLOGIST EVAL & MGMT  01/18/2017  . PACEMAKER IMPLANT N/A 02/09/2019   Procedure: PACEMAKER IMPLANT;  Surgeon: Constance Haw, MD;  Location: [facility];  Service:  Cardiovascular;  Laterality: N/A;  . RIGHT HEART CATH N/A 07/28/2016   Procedure: Right Heart Cath;  Surgeon: Larey Dresser, MD;  Location: [facility];  Service: Cardiovascular;  Laterality: N/A;    Current Outpatient Medications  Medication Sig Dispense Refill  . amLODipine (NORVASC) 10 MG tablet Take 1 tablet (10 mg total) by mouth daily. 30 tablet 3  . apixaban (ELIQUIS) 2.5 MG TABS tablet Take 1 tablet (2.5 mg total) by mouth 2 (two) times daily. 180 tablet 3  . atorvastatin (LIPITOR) 20 MG tablet Take 1 tablet (20 mg total) by mouth daily. 30 tablet 3  .  Brinzolamide-Brimonidine (SIMBRINZA) 1-0.2 % SUSP Place 1 drop into both eyes 2 (two) times daily.     Marland Kitchen donepezil (ARICEPT) 10 MG tablet Take 1 tablet (10 mg total) by mouth at bedtime. 30 tablet 11  . doxazosin (CARDURA) 4 MG tablet Take 1 tablet (4 mg total) by mouth daily. (Patient taking differently: Take 4 mg by mouth 2 (two) times daily. ) 30 tablet 3  . Epoetin Alfa (PROCRIT IJ) Inject 5,000 Units as directed every 14 (fourteen) days.     . hydrALAZINE (APRESOLINE) 100 MG tablet Take 1 tablet (100 mg total) by mouth 3 (three) times daily. Please make yearly appt with Dr. Radford Pax for October. 1st attempt 90 tablet 3  . levothyroxine (SYNTHROID, LEVOTHROID) 150 MCG tablet Take 150 mcg by mouth daily before breakfast.    . losartan (COZAAR) 50 MG tablet Take by mouth.    . memantine (NAMENDA) 10 MG tablet Take 1 tablet (10 mg total) by mouth 2 (two) times daily. 180 tablet 3  . NON FORMULARY Heparin Sodium (Porcine) 1,000 Units/mL Systemic    . triamcinolone ointment (KENALOG) 0.5 % Apply 1 application topically 2 (two) times daily as needed (rash).    . Tuberculin PPD (TUBERSOL ID) Inject into the skin.     No current facility-administered medications for this encounter.    Allergies  Allergen Reactions  . Iodinated Diagnostic Agents Other (See Comments)    Right heart cath 07/2016 allegedly "shut down his kidneys" Patient has stage III chronic kidney disease  . Zocor [Simvastatin] Other (See Comments)    Liver problems   . Calcitriol Other (See Comments)  . Budesonide-Formoterol Fumarate Other (See Comments)    Dry mouth  . Minoxidil Other (See Comments)    Unknown raction  . Tiotropium Bromide Monohydrate Other (See Comments)    Dry mouth  . Tricor [Fenofibrate]     Unknown reaction    Social History   Socioeconomic History  . Marital status: Married    Spouse name: Not on file  . Number of children: 4  . Years of education: completed high school  . Highest education  level: Not on file  Occupational History  . 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 Smoking status: Former Smoker    Packs/day: 2.00    Years: 40.00    Pack years: 80.00    Types: Cigarettes    Quit date: 03/29/1984    Years since quitting: 35.3  . Smokeless tobacco: Never Used  Substance and Sexual Activity  . Alcohol use: No    Comment: quit in 1981  . Drug use: No  . Sexual activity: Not on file  Other Topics Concern  . Not on file  Social History Narrative   Lives at home with his wife.  Right-handed.   Occasional use of caffeine.   Social Determinants of Health   Financial Resource Strain:   . Difficulty of Paying Living Expenses:   Food Insecurity:   . Worried About Charity fundraiser in the Last Year:   . Arboriculturist in the Last Year:   Transportation Needs:   . Film/video editor (Medical):   Marland Kitchen Lack of Transportation (Non-Medical):   Physical Activity:   . Days of Exercise per Week:   . Minutes of Exercise per Session:   Stress:   . Feeling of Stress :   Social Connections:   . Frequency of Communication with Friends and Family:   . Frequency of Social Gatherings with Friends and Family:   . Attends Religious Services:   . Active Member of Clubs or Organizations:   . Attends Archivist Meetings:   Marland Kitchen Marital Status:   Intimate Partner Violence:   . Fear of Current or Ex-Partner:   . Emotionally Abused:   Marland Kitchen Physically Abused:   . Sexually Abused:     Family History  Problem Relation Age of Onset  . Kidney failure Father   . Hypertension Father   . Colon cancer Mother   . Colon cancer Brother   . Lung disease Brother   . Colon cancer Maternal Uncle     ROS- All systems are reviewed and negative except as per the HPI above  Physical Exam: There were no vitals filed for this visit. Wt Readings from Last 3 Encounters:  08/01/19 52.6 kg  07/19/19 57.2 kg  05/22/19 60.6 kg     Labs: Lab Results  Component Value Date   NA 135 08/01/2019   K 3.3 (L) 08/01/2019   CL 93 (L) 08/01/2019   CO2 28 08/01/2019   GLUCOSE 131 (H) 08/01/2019   BUN 38 (H) 08/01/2019   CREATININE 4.60 (H) 08/01/2019   CALCIUM 9.3 08/01/2019   PHOS 3.6 07/28/2019   MG 2.1 07/29/2019   Lab Results  Component Value Date   INR 1.2 02/05/2019   Lab Results  Component Value Date   CHOL 117 05/04/2019   HDL 49 05/04/2019   LDLCALC 56 05/04/2019   TRIG 61 05/04/2019     GEN- The patient is well appearing, alert and oriented x 3 today.   Head- normocephalic, atraumatic Eyes-  Sclera clear, conjunctiva pink Ears- hearing intact Oropharynx- clear Neck- supple, no JVP Lymph- no cervical lymphadenopathy Lungs- Clear to ausculation bilaterally, normal work of breathing Heart- Regular rate and rhythm, no murmurs, rubs or gallops, PMI not laterally displaced GI- soft, NT, ND, + BS Extremities- no clubbing, cyanosis, or edema MS- no significant deformity or atrophy Skin- no rash or lesion Psych- euthymic mood, full affect Neuro- strength and sensation are intact  EKG-v paced at 70 bpm, qrs int 114 ms, qtc 529 ms Device notes reviewed   Assessment and Plan: 1. Atrial flutter Pt is already anticoagulated  He is not aware of the arrhythmia V rates are controlled  I do not see that  aggressive measures are needed at this point to try to restore  SR in the setting of advanced dementia and in palliative care I explained this to the patient's daughter and she is in agreement  She had no further questions for me   2.CHA2DS2VASc score of 5 Continue eliquis 2.5 mg bid   F/u with Dr. Curt Bears and the device clinic as needed  [place]. Rachael Ferrie, [facility]  [facility] [address] [place], [place] [address] [phone]

## 2019-08-07 NOTE — Progress Notes (Signed)
I have reviewed and agreed above plan. 

## 2019-08-08 ENCOUNTER — Ambulatory Visit: Payer: Medicare Other | Admitting: Podiatry

## 2019-08-08 DIAGNOSIS — N2581 Secondary hyperparathyroidism of renal origin: Secondary | ICD-10-CM | POA: Diagnosis not present

## 2019-08-08 DIAGNOSIS — D631 Anemia in chronic kidney disease: Secondary | ICD-10-CM | POA: Diagnosis not present

## 2019-08-08 DIAGNOSIS — N186 End stage renal disease: Secondary | ICD-10-CM | POA: Diagnosis not present

## 2019-08-08 DIAGNOSIS — D509 Iron deficiency anemia, unspecified: Secondary | ICD-10-CM | POA: Diagnosis not present

## 2019-08-08 DIAGNOSIS — R52 Pain, unspecified: Secondary | ICD-10-CM | POA: Insufficient documentation

## 2019-08-08 DIAGNOSIS — Z992 Dependence on renal dialysis: Secondary | ICD-10-CM | POA: Diagnosis not present

## 2019-08-09 ENCOUNTER — Other Ambulatory Visit: Payer: Self-pay

## 2019-08-09 ENCOUNTER — Ambulatory Visit (INDEPENDENT_AMBULATORY_CARE_PROVIDER_SITE_OTHER): Payer: Medicare Other | Admitting: Podiatry

## 2019-08-09 VITALS — BP 142/67 | HR 70 | Temp 97.7°F

## 2019-08-09 DIAGNOSIS — M79674 Pain in right toe(s): Secondary | ICD-10-CM

## 2019-08-09 DIAGNOSIS — M79675 Pain in left toe(s): Secondary | ICD-10-CM | POA: Diagnosis not present

## 2019-08-09 DIAGNOSIS — I0981 Rheumatic heart failure: Secondary | ICD-10-CM | POA: Diagnosis not present

## 2019-08-09 DIAGNOSIS — N185 Chronic kidney disease, stage 5: Secondary | ICD-10-CM

## 2019-08-09 DIAGNOSIS — J440 Chronic obstructive pulmonary disease with acute lower respiratory infection: Secondary | ICD-10-CM | POA: Diagnosis not present

## 2019-08-09 DIAGNOSIS — B351 Tinea unguium: Secondary | ICD-10-CM

## 2019-08-09 DIAGNOSIS — J189 Pneumonia, unspecified organism: Secondary | ICD-10-CM | POA: Diagnosis not present

## 2019-08-09 DIAGNOSIS — I2510 Atherosclerotic heart disease of native coronary artery without angina pectoris: Secondary | ICD-10-CM | POA: Diagnosis not present

## 2019-08-09 DIAGNOSIS — I132 Hypertensive heart and chronic kidney disease with heart failure and with stage 5 chronic kidney disease, or end stage renal disease: Secondary | ICD-10-CM | POA: Diagnosis not present

## 2019-08-09 DIAGNOSIS — G9341 Metabolic encephalopathy: Secondary | ICD-10-CM | POA: Diagnosis not present

## 2019-08-09 NOTE — Patient Instructions (Signed)
Diabetes Mellitus and Foot Care Foot care is an important part of your health, especially when you have diabetes. Diabetes may cause you to have problems because of poor blood flow (circulation) to your feet and legs, which can cause your skin to:  Become thinner and drier.  Break more easily.  Heal more slowly.  Peel and crack. You may also have nerve damage (neuropathy) in your legs and feet, causing decreased feeling in them. This means that you may not notice minor injuries to your feet that could lead to more serious problems. Noticing and addressing any potential problems early is the best way to prevent future foot problems. How to care for your feet Foot hygiene  Wash your feet daily with warm water and mild soap. Do not use hot water. Then, pat your feet and the areas between your toes until they are completely dry. Do not soak your feet as this can dry your skin.  Trim your toenails straight across. Do not dig under them or around the cuticle. File the edges of your nails with an emery board or nail file.  Apply a moisturizing lotion or petroleum jelly to the skin on your feet and to dry, brittle toenails. Use lotion that does not contain alcohol and is unscented. Do not apply lotion between your toes. Shoes and socks  Wear clean socks or stockings every day. Make sure they are not too tight. Do not wear knee-high stockings since they may decrease blood flow to your legs.  Wear shoes that fit properly and have enough cushioning. Always look in your shoes before you put them on to be sure there are no objects inside.  To break in new shoes, wear them for just a few hours a day. This prevents injuries on your feet. Wounds, scrapes, corns, and calluses  Check your feet daily for blisters, cuts, bruises, sores, and redness. If you cannot see the bottom of your feet, use a mirror or ask someone for help.  Do not cut corns or calluses or try to remove them with medicine.  If you  find a minor scrape, cut, or break in the skin on your feet, keep it and the skin around it clean and dry. You may clean these areas with mild soap and water. Do not clean the area with peroxide, alcohol, or iodine.  If you have a wound, scrape, corn, or callus on your foot, look at it several times a day to make sure it is healing and not infected. Check for: ? Redness, swelling, or pain. ? Fluid or blood. ? Warmth. ? Pus or a bad smell. General instructions  Do not cross your legs. This may decrease blood flow to your feet.  Do not use heating pads or hot water bottles on your feet. They may burn your skin. If you have lost feeling in your feet or legs, you may not know this is happening until it is too late.  Protect your feet from hot and cold by wearing shoes, such as at the beach or on hot pavement.  Schedule a complete foot exam at least once a year (annually) or more often if you have foot problems. If you have foot problems, report any cuts, sores, or bruises to your health care provider immediately. Contact a health care provider if:  You have a medical condition that increases your risk of infection and you have any cuts, sores, or bruises on your feet.  You have an injury that is not   healing.  You have redness on your legs or feet.  You feel burning or tingling in your legs or feet.  You have pain or cramps in your legs and feet.  Your legs or feet are numb.  Your feet always feel cold.  You have pain around a toenail. Get help right away if:  You have a wound, scrape, corn, or callus on your foot and: ? You have pain, swelling, or redness that gets worse. ? You have fluid or blood coming from the wound, scrape, corn, or callus. ? Your wound, scrape, corn, or callus feels warm to the touch. ? You have pus or a bad smell coming from the wound, scrape, corn, or callus. ? You have a fever. ? You have a red line going up your leg. Summary  Check your feet every day  for cuts, sores, red spots, swelling, and blisters.  Moisturize feet and legs daily.  Wear shoes that fit properly and have enough cushioning.  If you have foot problems, report any cuts, sores, or bruises to your health care provider immediately.  Schedule a complete foot exam at least once a year (annually) or more often if you have foot problems. This information is not intended to replace advice given to you by your health care provider. Make sure you discuss any questions you have with your health care provider. Document Revised: 12/06/2018 Document Reviewed: 04/16/2016 Elsevier Patient Education  2020 [employer]  Peripheral Vascular Disease Peripheral vascular disease (PVD) is a disease of the blood vessels. A simple term for PVD is poor circulation. In most cases, PVD narrows the blood vessels that carry blood from your heart to the rest of your body. This can result in a decreased supply of blood to your arms, legs, and internal organs, like your stomach or kidneys. However, it most often affects a person's lower legs and feet. There are two types of PVD.  Organic PVD. This is the more common type. It is caused by damage to the structure of blood vessels.  Functional PVD. This is caused by conditions that make blood vessels contract and tighten (spasm). Without treatment, PVD tends to get worse over time. PVD can also lead to acute limb ischemia. This is when an arm or leg suddenly has trouble getting enough blood. This is a medical emergency. What are the causes?  Each type of PVD has many different causes. The most common cause of PVD is buildup of a fatty material (plaque) inside your arteries (atherosclerosis). Small amounts of plaque can break off from the walls of the blood vessels and become lodged in a smaller artery. This blocks blood flow and can cause acute limb ischemia. Other common causes of PVD include:  Blood clots that form inside of blood vessels.  Injuries  to blood vessels.  Diseases that cause inflammation of blood vessels or cause blood vessel spasms.  Health behaviors and health history that increase your risk of developing PVD. What increases the risk? You are more likely to develop this condition if:  You have a family history of PVD.  You have certain medical conditions, including: ? High cholesterol. ? Diabetes. ? High blood pressure (hypertension). ? Coronary heart disease. ? Past problems with blood clots. ? Past injury, such as burns or a broken bone. These may have damaged blood vessels in your limbs. ? Buerger disease. This is caused by inflamed blood vessels in your hands and feet. ? Some forms of arthritis. ? Rare birth defects   that affect the arteries in your legs. ? Kidney disease.  You use tobacco or smoke.  You do not get enough exercise.  You are obese.  You are age 50 or older. What are the signs or symptoms? This condition may cause different symptoms. Your symptoms depend on what part of your body is not getting enough blood. Some common signs and symptoms include:  Cramps in your lower legs. This may be a symptom of poor leg circulation (claudication).  Pain and weakness in your legs. This happens while you are physically active but goes away when you rest (intermittent claudication).  Leg pain when at rest.  Leg numbness, tingling, or weakness.  Coldness in a leg or foot, especially when compared with the other leg.  Skin or hair changes. These can include: ? Hair loss. ? Shiny skin. ? Pale or bluish skin. ? Thick toenails.  Inability to get or maintain an erection (erectile dysfunction).  Fatigue. People with PVD are more likely to develop ulcers and sores on their toes, feet, or legs. These may take longer than normal to heal. How is this diagnosed? This condition is diagnosed based on:  Your signs and symptoms.  A physical exam and your medical history.  Other tests to find out what  is causing your PVD and to determine its severity. Tests may include: ? Blood pressure recordings from your arms and legs and measurements of the strength of your pulses (pulse volume recordings). ? Imaging studies using sound waves to take pictures of the blood flow through your blood vessels (Doppler ultrasound). ? Injecting a dye into your blood vessels before having imaging studies using:  X-rays (angiogram or arteriogram).  Computer-generated X-rays (CT angiogram).  A powerful electromagnetic field and a computer (magnetic resonance angiogram or MRA). How is this treated? Treatment for PVD depends on the cause of your condition and how severe your symptoms are. It also depends on your age. Underlying causes need to be treated and controlled. These include long-term (chronic) conditions, such as diabetes, high cholesterol, and high blood pressure. Treatment includes:  Lifestyle changes, such as: ? Quitting smoking. ? Exercising regularly. ? Following a low-fat, low-cholesterol diet.  Taking medicines, such as: ? Blood thinners to prevent blood clots. ? Medicines to improve blood flow. ? Medicines to improve your blood cholesterol levels.  Surgical procedures, such as: ? A procedure that uses an inflated balloon to open a blocked artery and improve blood flow (angioplasty). ? A procedure to put in a wire mesh tube to keep a blocked artery open (stent implant). ? Surgery to reroute blood flow around a blocked artery (peripheral bypass surgery). ? Surgery to remove dead tissue from an infected wound on the affected limb. ? Amputation. This is surgical removal of the affected limb. It may be necessary in cases of acute limb ischemia where there has been no improvement through medical or surgical treatments. Follow these instructions at home: Lifestyle  Do not use any products that contain nicotine or tobacco, such as cigarettes and e-cigarettes. If you need help quitting, ask your  health care provider.  Lose weight if you are overweight, and maintain a healthy weight as discussed by your health care provider.  Eat a diet that is low in fat and cholesterol. If you need help, ask your health care provider.  Exercise regularly. Ask your health care provider to suggest some good activities for you. General instructions  Take over-the-counter and prescription medicines only as told by your health   care provider.  Take good care of your feet: ? Wear comfortable shoes that fit well. ? Check your feet often for any cuts or sores.  Keep all follow-up visits as told by your health care provider. This is important. Contact a health care provider if:  You have cramps in your legs while walking.  You have leg pain when you are at rest.  You have coldness in a leg or foot.  Your skin changes.  You have erectile dysfunction.  You have cuts or sores on your feet that are not healing. Get help right away if:  Your arm or leg turns cold, numb, and blue.  Your arms or legs become red, warm, swollen, painful, or numb.  You have chest pain or trouble breathing.  You suddenly have weakness in your face, arm, or leg.  You become very confused or lose the ability to speak.  You suddenly have a very bad headache or lose your vision. Summary  Peripheral vascular disease (PVD) is a disease of the blood vessels.  In most cases, PVD narrows the blood vessels that carry blood from your heart to the rest of your body.  PVD may cause different symptoms. Your symptoms depend on what part of your body is not getting enough blood.  Treatment for PVD depends on the cause of your condition and how severe your symptoms are. This information is not intended to replace advice given to you by your health care provider. Make sure you discuss any questions you have with your health care provider. Document Revised: 02/25/2017 Document Reviewed: 04/22/2016 Elsevier Patient Education   2020 [employer].  Peripheral Neuropathy Peripheral neuropathy is a type of nerve damage. It affects nerves that carry signals between the spinal cord and the arms, legs, and the rest of the body (peripheral nerves). It does not affect nerves in the spinal cord or brain. In peripheral neuropathy, one nerve or a group of nerves may be damaged. Peripheral neuropathy is a broad category that includes many specific nerve disorders, like diabetic neuropathy, hereditary neuropathy, and carpal tunnel syndrome. What are the causes? This condition may be caused by:  Diabetes. This is the most common cause of peripheral neuropathy.  Nerve injury.  Pressure or stress on a nerve that lasts a long time.  Lack (deficiency) of B vitamins. This can result from alcoholism, poor diet, or a restricted diet.  Infections.  Autoimmune diseases, such as rheumatoid arthritis and systemic lupus erythematosus.  Nerve diseases that are passed from parent to child (inherited).  Some medicines, such as cancer medicines (chemotherapy).  Poisonous (toxic) substances, such as lead and mercury.  Too little blood flowing to the legs.  Kidney disease.  Thyroid disease. In some cases, the cause of this condition is not known. What are the signs or symptoms? Symptoms of this condition depend on which of your nerves is damaged. Common symptoms include:  Loss of feeling (numbness) in the feet, hands, or both.  Tingling in the feet, hands, or both.  Burning pain.  Very sensitive skin.  Weakness.  Not being able to move a part of the body (paralysis).  Muscle twitching.  Clumsiness or poor coordination.  Loss of balance.  Not being able to control your bladder.  Feeling dizzy.  Sexual problems. How is this diagnosed? Diagnosing and finding the cause of peripheral neuropathy can be difficult. Your health care provider will take your medical history and do a physical exam. A neurological exam will  also be  done. This involves checking things that are affected by your brain, spinal cord, and nerves (nervous system). For example, your health care provider will check your reflexes, how you move, and what you can feel. You may have other tests, such as:  Blood tests.  Electromyogram (EMG) and nerve conduction tests. These tests check nerve function and how well the nerves are controlling the muscles.  Imaging tests, such as CT scans or MRI to rule out other causes of your symptoms.  Removing a small piece of nerve to be examined in a lab (nerve biopsy). This is rare.  Removing and examining a small amount of the fluid that surrounds the brain and spinal cord (lumbar puncture). This is rare. How is this treated? Treatment for this condition may involve:  Treating the underlying cause of the neuropathy, such as diabetes, kidney disease, or vitamin deficiencies.  Stopping medicines that can cause neuropathy, such as chemotherapy.  Medicine to relieve pain. Medicines may include: ? Prescription or over-the-counter pain medicine. ? Antiseizure medicine. ? Antidepressants. ? Pain-relieving patches that are applied to painful areas of skin.  Surgery to relieve pressure on a nerve or to destroy a nerve that is causing pain.  Physical therapy to help improve movement and balance.  Devices to help you move around (assistive devices). Follow these instructions at home: Medicines  Take over-the-counter and prescription medicines only as told by your health care provider. Do not take any other medicines without first asking your health care provider.  Do not drive or use heavy machinery while taking prescription pain medicine. Lifestyle   Do not use any products that contain nicotine or tobacco, such as cigarettes and e-cigarettes. Smoking keeps blood from reaching damaged nerves. If you need help quitting, ask your health care provider.  Avoid or limit alcohol. Too much alcohol can  cause a vitamin B deficiency, and vitamin B is needed for healthy nerves.  Eat a healthy diet. This includes: ? Eating foods that are high in fiber, such as fresh fruits and vegetables, whole grains, and beans. ? Limiting foods that are high in fat and processed sugars, such as fried or sweet foods. General instructions   If you have diabetes, work closely with your health care provider to keep your blood sugar under control.  If you have numbness in your feet: ? Check every day for signs of injury or infection. Watch for redness, warmth, and swelling. ? Wear padded socks and comfortable shoes. These help protect your feet.  Develop a good support system. Living with peripheral neuropathy can be stressful. Consider talking with a mental health specialist or joining a support group.  Use assistive devices and attend physical therapy as told by your health care provider. This may include using a walker or a cane.  Keep all follow-up visits as told by your health care provider. This is important. Contact a health care provider if:  You have new signs or symptoms of peripheral neuropathy.  You are struggling emotionally from dealing with peripheral neuropathy.  Your pain is not well-controlled. Get help right away if:  You have an injury or infection that is not healing normally.  You develop new weakness in an arm or leg.  You fall frequently. Summary  Peripheral neuropathy is when the nerves in the arms, or legs are damaged, resulting in numbness, weakness, or pain.  There are many causes of peripheral neuropathy, including diabetes, pinched nerves, vitamin deficiencies, autoimmune disease, and hereditary conditions.  Diagnosing and finding the  cause of peripheral neuropathy can be difficult. Your health care provider will take your medical history, do a physical exam, and do tests, including blood tests and nerve function tests.  Treatment involves treating the underlying  cause of the neuropathy and taking medicines to help control pain. Physical therapy and assistive devices may also help. This information is not intended to replace advice given to you by your health care provider. Make sure you discuss any questions you have with your health care provider. Document Revised: 02/25/2017 Document Reviewed: 05/24/2016 Elsevier Patient Education  2020 [employer].

## 2019-08-10 DIAGNOSIS — Z992 Dependence on renal dialysis: Secondary | ICD-10-CM | POA: Diagnosis not present

## 2019-08-10 DIAGNOSIS — D631 Anemia in chronic kidney disease: Secondary | ICD-10-CM | POA: Diagnosis not present

## 2019-08-10 DIAGNOSIS — D509 Iron deficiency anemia, unspecified: Secondary | ICD-10-CM | POA: Diagnosis not present

## 2019-08-10 DIAGNOSIS — N2581 Secondary hyperparathyroidism of renal origin: Secondary | ICD-10-CM | POA: Diagnosis not present

## 2019-08-10 DIAGNOSIS — N186 End stage renal disease: Secondary | ICD-10-CM | POA: Diagnosis not present

## 2019-08-13 ENCOUNTER — Emergency Department (HOSPITAL_COMMUNITY): Payer: Medicare Other

## 2019-08-13 ENCOUNTER — Other Ambulatory Visit: Payer: Self-pay

## 2019-08-13 ENCOUNTER — Emergency Department (HOSPITAL_COMMUNITY)
Admission: EM | Admit: 2019-08-13 | Discharge: 2019-08-13 | Disposition: A | Discharge status: Home / Self Care | Payer: Medicare Other | Attending: Emergency Medicine | Admitting: Emergency Medicine

## 2019-08-13 ENCOUNTER — Encounter (HOSPITAL_COMMUNITY): Payer: Self-pay | Admitting: Emergency Medicine

## 2019-08-13 DIAGNOSIS — I2510 Atherosclerotic heart disease of native coronary artery without angina pectoris: Secondary | ICD-10-CM | POA: Insufficient documentation

## 2019-08-13 DIAGNOSIS — N184 Chronic kidney disease, stage 4 (severe): Secondary | ICD-10-CM | POA: Diagnosis not present

## 2019-08-13 DIAGNOSIS — Y929 Unspecified place or not applicable: Secondary | ICD-10-CM | POA: Diagnosis not present

## 2019-08-13 DIAGNOSIS — S065X0A Traumatic subdural hemorrhage without loss of consciousness, initial encounter: Secondary | ICD-10-CM | POA: Diagnosis not present

## 2019-08-13 DIAGNOSIS — S199XXA Unspecified injury of neck, initial encounter: Secondary | ICD-10-CM | POA: Diagnosis not present

## 2019-08-13 DIAGNOSIS — I1310 Hypertensive heart and chronic kidney disease without heart failure, with stage 1 through stage 4 chronic kidney disease, or unspecified chronic kidney disease: Secondary | ICD-10-CM | POA: Diagnosis not present

## 2019-08-13 DIAGNOSIS — S0081XA Abrasion of other part of head, initial encounter: Secondary | ICD-10-CM | POA: Diagnosis not present

## 2019-08-13 DIAGNOSIS — Y999 Unspecified external cause status: Secondary | ICD-10-CM | POA: Insufficient documentation

## 2019-08-13 DIAGNOSIS — S299XXA Unspecified injury of thorax, initial encounter: Secondary | ICD-10-CM | POA: Diagnosis not present

## 2019-08-13 DIAGNOSIS — X58XXXA Exposure to other specified factors, initial encounter: Secondary | ICD-10-CM | POA: Insufficient documentation

## 2019-08-13 DIAGNOSIS — Z79899 Other long term (current) drug therapy: Secondary | ICD-10-CM | POA: Diagnosis not present

## 2019-08-13 DIAGNOSIS — Z23 Encounter for immunization: Secondary | ICD-10-CM | POA: Diagnosis not present

## 2019-08-13 DIAGNOSIS — Z7901 Long term (current) use of anticoagulants: Secondary | ICD-10-CM | POA: Diagnosis not present

## 2019-08-13 DIAGNOSIS — Z950 Presence of cardiac pacemaker: Secondary | ICD-10-CM | POA: Diagnosis not present

## 2019-08-13 DIAGNOSIS — I10 Essential (primary) hypertension: Secondary | ICD-10-CM | POA: Diagnosis not present

## 2019-08-13 DIAGNOSIS — Z87891 Personal history of nicotine dependence: Secondary | ICD-10-CM | POA: Diagnosis not present

## 2019-08-13 DIAGNOSIS — W19XXXA Unspecified fall, initial encounter: Secondary | ICD-10-CM | POA: Diagnosis not present

## 2019-08-13 DIAGNOSIS — S0990XA Unspecified injury of head, initial encounter: Secondary | ICD-10-CM | POA: Diagnosis present

## 2019-08-13 DIAGNOSIS — G9608 Other cranial cerebrospinal fluid leak: Secondary | ICD-10-CM | POA: Insufficient documentation

## 2019-08-13 DIAGNOSIS — Y939 Activity, unspecified: Secondary | ICD-10-CM | POA: Insufficient documentation

## 2019-08-13 DIAGNOSIS — S3993XA Unspecified injury of pelvis, initial encounter: Secondary | ICD-10-CM | POA: Diagnosis not present

## 2019-08-13 DIAGNOSIS — R404 Transient alteration of awareness: Secondary | ICD-10-CM | POA: Diagnosis not present

## 2019-08-13 DIAGNOSIS — R456 Violent behavior: Secondary | ICD-10-CM | POA: Diagnosis not present

## 2019-08-13 LAB — BASIC METABOLIC PANEL
Anion gap: 12 (ref 5–15)
BUN: 54 mg/dL — ABNORMAL HIGH (ref 8–23)
CO2: 27 mmol/L (ref 22–32)
Calcium: 9.1 mg/dL (ref 8.9–10.3)
Chloride: 101 mmol/L (ref 98–111)
Creatinine, Ser: 6.95 mg/dL — ABNORMAL HIGH (ref 0.61–1.24)
GFR calc Af Amer: 8 mL/min — ABNORMAL LOW (ref >60)
GFR calc non Af Amer: 7 mL/min — ABNORMAL LOW (ref >60)
Glucose, Bld: 90 mg/dL (ref 70–99)
Potassium: 3.9 mmol/L (ref 3.5–5.1)
Sodium: 140 mmol/L (ref 135–145)

## 2019-08-13 LAB — CBC WITH DIFFERENTIAL/PLATELET
Abs Immature Granulocytes: 0.03 10*3/uL (ref 0.00–0.07)
Basophils Absolute: 0 10*3/uL (ref 0.0–0.1)
Basophils Relative: 0 %
Eosinophils Absolute: 0.1 10*3/uL (ref 0.0–0.5)
Eosinophils Relative: 1 %
HCT: 23.3 % — ABNORMAL LOW (ref 39.0–52.0)
Hemoglobin: 7 g/dL — ABNORMAL LOW (ref 13.0–17.0)
Immature Granulocytes: 0 %
Lymphocytes Relative: 9 %
Lymphs Abs: 0.7 10*3/uL (ref 0.7–4.0)
MCH: 30.3 pg (ref 26.0–34.0)
MCHC: 30 g/dL (ref 30.0–36.0)
MCV: 100.9 fL — ABNORMAL HIGH (ref 80.0–100.0)
Monocytes Absolute: 0.6 10*3/uL (ref 0.1–1.0)
Monocytes Relative: 7 %
Neutro Abs: 6.6 10*3/uL (ref 1.7–7.7)
Neutrophils Relative %: 83 %
Platelets: 193 10*3/uL (ref 150–400)
RBC: 2.31 MIL/uL — ABNORMAL LOW (ref 4.22–5.81)
RDW: 14.9 % (ref 11.5–15.5)
WBC: 8 10*3/uL (ref 4.0–10.5)
nRBC: 0 % (ref 0.0–0.2)

## 2019-08-13 LAB — CBG MONITORING, ED: Glucose-Capillary: 193 mg/dL — ABNORMAL HIGH (ref 70–99)

## 2019-08-13 MED ORDER — TETANUS-DIPHTH-ACELL PERTUSSIS 5-2.5-18.5 LF-MCG/0.5 IM SUSP
0.5000 mL | Freq: Once | INTRAMUSCULAR | Status: AC
  Administered 2019-08-13: 0.5 mL via INTRAMUSCULAR
  Filled 2019-08-13: qty 0.5

## 2019-08-13 NOTE — ED Provider Notes (Signed)
Patient taken in sign out from PA Morreli at shift change.  Patient here after fall.  He had some subdural hygromas.  Initial plan was to touch base with Dr. Zada Finders.  I was able to contact him however review of his note states clearly that he felt the patient was safe to be discharged regarding to his nor surgical issues.  I did contact Dr. Elaina Pattee guard who confirmed that that was still the plan.    Clinical Course as of Aug 14 1846  Mon Aug 13, 2019  [address] [place] daughter   [BM]  1411 Dr. Zada Finders   [BM]  [phone] Informed by nurse that patient's daughter wants to know why he isn't getting dialysis today. I was not informed at sign out of his ESRD status. Will check labs and EKG to rule out need for emergent dialysis.   [AH]    Clinical Course User Index [AH] Margarita Mail, PA-C [BM] Deliah Boston, PA-C    At discharge the patient's daughter was confused as to why he was not being dialyzed.  Of note labs were obtained so I obtained these.  I have reviewed those labs which i which shows that his creatinine and BUN are at baseline, he has no emergent EKG findings and his potassium is in 1 normal limits.  He has no signs of volume overload and does not need emergent dialysis.  I was able to touch base with his daughter [name] and inform her that I would send a note to our dialysis coordinator to help him get scheduled for dialysis tomorrow.  She is okay with that plan.  He is otherwise appropriate for discharge with close outpatient follow-up.   Margarita Mail, PA-C 08/14/19 1849    Dorie Rank, MD 08/15/19 1623

## 2019-08-13 NOTE — ED Notes (Signed)
Discharge instructions discussed with pt and daughter. Pt verbalized understanding. Pt stable and ambulatory. No signature pad available

## 2019-08-13 NOTE — Discharge Instructions (Addendum)
1) STOP taking your Eliquis immediately! 2) Follow up with Dr. Zada Finders in 2 weeks Get help right away if you: Are taking blood thinners and you fall or you experience minor trauma to the head. If you take any blood thinners, even a very small injury can cause a subdural hematoma. Have a bleeding disorder and you fall or you experience minor trauma to the head. Develop any of the following symptoms after a head injury: Clear fluid draining from your nose or ears. Nausea or vomiting. Changes in speech or trouble understanding what people say. Seizures. Drowsiness or a decrease in alertness. Double vision. Numbness or inability to move (paralysis) in any part of your body. Difficulty walking or poor coordination. Difficulty thinking. Confusion or forgetfulness. Personality changes. Irrational or aggressive behavior.

## 2019-08-13 NOTE — ED Triage Notes (Signed)
Pt in from home w/dtr, via [facility]. Per EMS, pt had trip fall last night, has L forehead abrasion, takes Eliquis. Has dementia, a&ox3. Pt is M/W/F dialysis per EMS, refused trx today and was agitated w/dtr PTA.

## 2019-08-13 NOTE — Consult Note (Signed)
Neurosurgery Consultation  Reason for Consult: Subdural hematomas Referring Physician: Regenia Skeeter  CC: Fall  HPI: This is a 86 y.o. man that with a h/o MMP including ESRD, severe pulmonary HTN, OSA, A-flutter on eliquis. He is not sure why he is in the ED, but by report he told his family he fell yesterday after they noticed some signs of trauma on his forehead. He is not very cooperative with my questioning, just wants to get out of the ED as soon as possible. He denies any headaches, change in balance / no new weakness / numbness. He takes eliquis for atrial flutter, per report.    ROS: A 14 point ROS was performed and is negative except as noted in the HPI.   PMHx:  Past Medical History:  Diagnosis Date  . Anemia    low iron  . BPH (benign prostatic hyperplasia)   . CAD (coronary artery disease)   . Carotid artery stenosis    1-39% bilateral carotid artery stenosis and < 50% stenosis in the right CCA by dopplers 06/2017  . CKD (chronic kidney disease), stage III    Stage 4  . Diastolic dysfunction   . Glaucoma   . Graves disease   . History of ETOH abuse   . Hyperlipemia   . Hypertension   . Hyperthyroidism 08/26/10   radioactive iodine therapy   . Memory loss   . Mitral regurgitation echo 2015   mild  . Multiple thyroid nodules   . MVP (mitral valve prolapse) 11/2012   posterior MVP  . OSA (obstructive sleep apnea)    upper airway resistance syndrome with RDI 18/hr - not on CPAP due to insurance not covering  . Pre-diabetes   . PUD (peptic ulcer disease)   . Pulmonary hypertension ([place]) echo 2015   Group 2 with pulmonary venous HTN and Group 3 with OSA  . Upper airway resistance syndrome    FamHx:  Family History  Problem Relation Age of Onset  . Kidney failure Father   . Hypertension Father   . Colon cancer Mother   . Colon cancer Brother   . Lung disease Brother   . Colon cancer Maternal Uncle    SocHx:  reports that he quit smoking about 35 years ago. His  smoking use included cigarettes. He has a 80.00 pack-year smoking history. He has never used smokeless tobacco. He reports that he does not drink alcohol or use drugs.  Exam: Vital signs in last 24 hours: Temp:  [98 F (36.7 C)] 98 F (36.7 C) (05/17 1142) Pulse Rate:  [70] 70 (05/17 1142) Resp:  [17] 17 (05/17 1142) BP: (148)/(52) 148/52 (05/17 1142) SpO2:  [99 %-100 %] 100 % (05/17 1142) Weight:  [49.9 kg-50 kg] 50 kg (05/17 1144) General: Awake, alert, cooperative, lying in bed in NAD Head: Normocephalic and atruamatic HEENT: Neck supple Pulmonary: breathing room air comfortably, no evidence of increased work of breathing Psych: flat affect Abdomen: S NT ND Extremities: Warm and well perfused x4 Neuro: AOx1 - thinks it's 2020, knows he's in the hospital but guessed the wrong hospital, PERRL, gaze neutral, unable to keep head still for EOM testing, FS Strength 5/5 x4, SILTx4, refused drift testing   Assessment and Plan: 86 y.o. man s/p fall on eliquis for flutter. [place] personally reviewed, which shows bilateral hypodense subdural collections.   -no acute neurosurgical intervention, no need for admission from my standpoint. Recommend discontinuing eliquis and follow up with me in clinic with a CT  head prior to seeing me.  Judith Part, MD 08/13/19 3:07 PM [facility]

## 2019-08-13 NOTE — ED Notes (Signed)
Patient given discharge instructions and this rn spoke to family and they verbalizes understanding.

## 2019-08-13 NOTE — ED Notes (Signed)
Daughter at facility to pick up son. PA discussed d/c paper with daughter

## 2019-08-13 NOTE — ED Provider Notes (Signed)
Wilson Jeffrey Campbell   CSN: [id number] Arrival date & time: 08/13/19  1137     History Chief Complaint  Patient presents with  . Fall  . Head Injury    ETHRIDGE SOLLENBERGER is a 86 y.o. male history of dementia, CKD, CAD, hypertension, hyperlipidemia, pacemaker.  Patient presents today with following a head injury.  Patient reports he does not recall falling, he is alert and oriented to self and place.  He denies any pain reports that he is feeling well.  Level 5 caveat dementia  HPI     Past Medical History:  Diagnosis Date  . Anemia    low iron  . BPH (benign prostatic hyperplasia)   . CAD (coronary artery disease)   . Carotid artery stenosis    1-39% bilateral carotid artery stenosis and < 50% stenosis in the right CCA by dopplers 06/2017  . CKD (chronic kidney disease), stage III    Stage 4  . Diastolic dysfunction   . Glaucoma   . Graves disease   . History of ETOH abuse   . Hyperlipemia   . Hypertension   . Hyperthyroidism 08/26/10   radioactive iodine therapy   . Memory loss   . Mitral regurgitation echo 2015   mild  . Multiple thyroid nodules   . MVP (mitral valve prolapse) 11/2012   posterior MVP  . OSA (obstructive sleep apnea)    upper airway resistance syndrome with RDI 18/hr - not on CPAP due to insurance not covering  . Pre-diabetes   . PUD (peptic ulcer disease)   . Pulmonary hypertension ([place]) echo 2015   Group 2 with pulmonary venous HTN and Group 3 with OSA  . Upper airway resistance syndrome     Patient Active Problem List   Diagnosis Date Noted  . Delirium   . Palliative care encounter   . Hypertension   . ESRD (end stage renal disease) ([place])   . Elevated troponin   . Complete heart block ([place]) 02/08/2019  . Atrial flutter ([place])   . Dementia without behavioral disturbance ([place]) 09/18/2018  . Goals of care, counseling/discussion   . Palliative care by specialist   . DNR (do not resuscitate)  discussion   . Acute encephalopathy 09/01/2018  . Hypothermia 09/01/2018  . GI bleeding 10/15/2016  . GI bleed 10/15/2016  . Anemia of chronic disease   . CKD (chronic kidney disease), stage V ([place])   . Hypertensive urgency   . Vascular dementia with behavioral disturbance ([place]) 09/24/2016  . Acute metabolic encephalopathy   . Branch retinal vein occlusion of right eye 05/07/2016  . Graves' orbitopathy 05/07/2016  . Primary open angle glaucoma of left eye, mild stage 05/07/2016  . Primary open angle glaucoma of right eye, severe stage 05/07/2016  . PAD (peripheral artery disease) ([place]) 04/20/2016  . Bilateral renal artery stenosis ([place]) 02/18/2015  . Pulmonary hypertension ([place]) 02/10/2015  . Chronic diastolic CHF (congestive heart failure) ([place]) 02/10/2015  . Renal bruit 01/07/2015  . Carotid artery stenosis   . Diastolic dysfunction   . Bradycardia 05/29/2013  . Glaucoma 04/09/2011  . COPD (chronic obstructive pulmonary disease) ([place]) 09/21/2010  . OSA (obstructive sleep apnea)   . Upper airway resistance syndrome   . Hypothyroidism   . Type 2 diabetes, diet controlled ([place])   . CAD (coronary artery disease)   . Hyperlipemia   . Hypertensive heart and chronic kidney disease with heart failure and stage 1 through stage  4 chronic kidney disease, or chronic kidney disease ([place])   . PUD (peptic ulcer disease)     Past Surgical History:  Procedure Laterality Date  . APPENDECTOMY    . AV FISTULA PLACEMENT Left 10/18/2017   Procedure: ARTERIOVENOUS (AV) FISTULA CREATION ARM;  Surgeon: Waynetta Sandy, MD;  Location: [place];  Service: Vascular;  Laterality: Left;  . CARDIAC CATHETERIZATION    . CARDIAC CATHETERIZATION N/A 02/17/2015   Procedure: Right Heart Cath;  Surgeon: Larey Dresser, MD;  Location: [place] [facility];  Service: Cardiovascular;  Laterality: N/A;  . CARDIOVERSION N/A 02/08/2019   Procedure: CARDIOVERSION;  Surgeon: Donato Heinz, MD;   Location[facility] ENDOSCOPY;  Service: Endoscopy;  Laterality: N/A;  . ESOPHAGOGASTRODUODENOSCOPY (EGD) WITH PROPOFOL Left 10/16/2016   Procedure: ESOPHAGOGASTRODUODENOSCOPY (EGD) WITH PROPOFOL;  Surgeon: Ronnette Juniper, MD;  Location: [place];  Service: Gastroenterology;  Laterality: Left;  . heart catherization    . HERNIA REPAIR  10/2009  . IR RADIOLOGIST EVAL & MGMT  09/28/2016  . IR RADIOLOGIST EVAL & MGMT  10/19/2016  . IR RADIOLOGIST EVAL & MGMT  01/18/2017  . PACEMAKER IMPLANT N/A 02/09/2019   Procedure: PACEMAKER IMPLANT;  Surgeon: Constance Haw, MD;  Location: [place] [facility];  Service: Cardiovascular;  Laterality: N/A;  . RIGHT HEART CATH N/A 07/28/2016   Procedure: Right Heart Cath;  Surgeon: Larey Dresser, MD;  Location: [place] [facility];  Service: Cardiovascular;  Laterality: N/A;       Family History  Problem Relation Age of Onset  . Kidney failure Father   . Hypertension Father   . Colon cancer Mother   . Colon cancer Brother   . Lung disease Brother   . Colon cancer Maternal Uncle     Social History   Tobacco Use  . Smoking status: Former Smoker    Packs/day: 2.00    Years: 40.00    Pack years: 80.00    Types: Cigarettes    Quit date: 03/29/1984    Years since quitting: 35.3  . Smokeless tobacco: Never Used  Substance Use Topics  . Alcohol use: No    Comment: quit in 1981  . Drug use: No    Home Medications Prior to Admission medications   Medication Sig Start Date End Date Taking? Authorizing Provider  apixaban (ELIQUIS) 2.5 MG TABS tablet Take 1 tablet (2.5 mg total) by mouth 2 (two) times daily. 06/19/19  Yes Larey Dresser, MD  atorvastatin (LIPITOR) 20 MG tablet Take 1 tablet (20 mg total) by mouth daily. 06/29/19  Yes Larey Dresser, MD  Brinzolamide-Brimonidine [facility]) 1-0.2 % SUSP Place 1 drop into both eyes 2 (two) times daily.    Yes [provider]  donepezil (ARICEPT) 10 MG tablet Take 1 tablet (10 mg total) by mouth at  bedtime. 07/19/19  Yes Suzzanne Cloud, NP  doxazosin (CARDURA) 4 MG tablet Take 1 tablet (4 mg total) by mouth daily. 02/12/19  Yes Lyda Jester M, PA-C  Epoetin Alfa (PROCRIT IJ) Inject 5,000 Units as directed every 14 (fourteen) days.    Yes [provider]  folic acid-vitamin b complex-vitamin c-selenium-zinc (DIALYVITE) 3 MG TABS tablet Take 1 tablet by mouth daily.   Yes [provider]  levothyroxine (SYNTHROID, LEVOTHROID) 150 MCG tablet Take 150 mcg by mouth daily before breakfast.   Yes [provider]  memantine (NAMENDA) 10 MG tablet Take 1 tablet (10 mg total) by mouth 2 (two) times daily.  07/19/19  Yes Suzzanne Cloud, NP  amLODipine (NORVASC) 10 MG tablet TAKE 1 TABLET BY MOUTH EVERY DAY Patient not taking: Reported on 08/13/2019 08/08/19   Lyda Jester M, PA-C  hydrALAZINE (APRESOLINE) 100 MG tablet Take 1 tablet (100 mg total) by mouth 3 (three) times daily. Please make yearly appt with Dr. Radford Pax for October. 1st attempt Patient not taking: Reported on 08/13/2019 02/12/19   Lyda Jester M, PA-C  NON FORMULARY Heparin Sodium (Porcine) 1,000 Units/mL Systemic 07/30/19 07/28/20  [provider]  Tuberculin PPD (TUBERSOL ID) Inject into the skin. 07/20/19   [provider]    Allergies    Iodinated diagnostic agents, Zocor [simvastatin], Calcitriol, Budesonide-formoterol fumarate, Minoxidil, Tiotropium bromide monohydrate, and Tricor [fenofibrate]  Review of Systems   Review of Systems  Unable to perform ROS: Dementia    Physical Exam Updated Vital Signs BP (!) 148/52 (BP Location: Right Arm)   Pulse 70   Temp 98 F (36.7 C) (Oral)   Resp 17   Ht 5\' 9"  (1.753 m)   Wt 50 kg   SpO2 100%   BMI 16.28 kg/m   Physical Exam Constitutional:      General: He is not in acute distress.    Appearance: Normal appearance. He is well-developed. He is not ill-appearing or diaphoretic.  HENT:     Head: Normocephalic. Abrasion  present. No raccoon eyes or Battle's sign.     Jaw: There is normal jaw occlusion. No trismus.      Right Ear: External ear normal.     Left Ear: External ear normal.     Nose: Nose normal.     Right Nostril: No epistaxis.     Left Nostril: No epistaxis.     Mouth/Throat:     Comments: No evidence of intraoral injury Eyes:     General: Vision grossly intact. Gaze aligned appropriately.     Extraocular Movements: Extraocular movements intact.     Pupils: Pupils are equal, round, and reactive to light.  Neck:     Trachea: Trachea and phonation normal. No tracheal deviation.  Cardiovascular:     Rate and Rhythm: Normal rate and regular rhythm.  Pulmonary:     Effort: Pulmonary effort is normal. No accessory muscle usage or respiratory distress.     Breath sounds: Normal breath sounds and air entry.  Chest:     Chest wall: No deformity, tenderness or crepitus.     Comments: No evidence of injury of the chest Abdominal:     General: There is no distension.     Palpations: Abdomen is soft.     Tenderness: There is no abdominal tenderness. There is no guarding or rebound.     Comments: No evidence of injury of the abdomen  Musculoskeletal:        General: Normal range of motion.     Cervical back: Full passive range of motion without pain, normal range of motion and neck supple.     Comments: No midline C/T/L spinal tenderness to palpation, no paraspinal muscle tenderness, no deformity, crepitus, or step-off noted. No sign of injury to the neck or back. - No pain with palpation of the pelvis.  Patient able to pull bilateral knees to chest without assistance or difficulty.  Able to pull self to a seated position without assistance or difficulty.  All major joints mobilized with appropriate information for age.  Feet:     Right foot:     Protective Sensation: 3 sites  tested. 3 sites sensed.     Left foot:     Protective Sensation: 3 sites tested. 3 sites sensed.  Skin:    General:  Skin is warm and dry.  Neurological:     Mental Status: He is alert.     GCS: GCS eye subscore is 4. GCS verbal subscore is 5. GCS motor subscore is 6.     Comments: Speech is clear, follows commands Major Cranial nerves without deficit, no facial droop Moves extremities without ataxia, coordination intact  Psychiatric:        Behavior: Behavior normal.     ED Results / Procedures / Treatments   Labs (all labs ordered are listed, but only abnormal results are displayed) Labs Reviewed  CBG MONITORING, ED - Abnormal; Notable for the following components:      Result Value   Glucose-Capillary 193 (*)    All other components within normal limits  CBC WITH DIFFERENTIAL/PLATELET  BASIC METABOLIC PANEL    EKG None  Radiology CT Head Wo Contrast  Result Date: 08/13/2019 CLINICAL DATA:  Fall. Head injury. On Eliquis. Dementia. Dialysis patient. EXAM: CT HEAD WITHOUT CONTRAST CT CERVICAL SPINE WITHOUT CONTRAST TECHNIQUE: Multidetector CT imaging of the head and cervical spine was performed following the standard protocol without intravenous contrast. Multiplanar CT image reconstructions of the cervical spine were also generated. COMPARISON:  CT head 07/25/2019 FINDINGS: CT HEAD FINDINGS Brain: Interval development of bilateral subdural hygromas. These are slightly more dense than CSF and likely are due to acute injury. No high-density hemorrhage identified. Generalized atrophy. Moderate chronic microvascular ischemic change in the white matter. Negative for acute infarct or mass. Ventricle size not enlarged. Vascular: Negative for hyperdense vessel Skull: Negative Sinuses/Orbits: Paranasal sinuses clear. No orbital lesion. Proptosis right greater than left. Other: None CT CERVICAL SPINE FINDINGS Alignment: Normal Skull base and vertebrae: Negative for fracture Soft tissues and spinal canal: Atherosclerotic calcification. No soft tissue swelling or mass. Disc levels: Multilevel disc and facet  degeneration. Mild foraminal narrowing bilaterally at C3-4. Moderate left foraminal narrowing at C4-5. Mild foraminal narrowing at C5-6 and C6-7 Upper chest: Small left effusion. Other: None IMPRESSION: 1. Interval development of bilateral subdural hygromas measuring approximately 6 mm. These are likely due to acute injury. No high density blood products identified. No midline shift. 2. Atrophy and chronic microvascular ischemia 3. Cervical spondylosis.  Negative for fracture. Electronically Signed   By: Franchot Gallo M.D.   On: 08/13/2019 12:55   CT Cervical Spine Wo Contrast  Result Date: 08/13/2019 CLINICAL DATA:  Fall. Head injury. On Eliquis. Dementia. Dialysis patient. EXAM: CT HEAD WITHOUT CONTRAST CT CERVICAL SPINE WITHOUT CONTRAST TECHNIQUE: Multidetector CT imaging of the head and cervical spine was performed following the standard protocol without intravenous contrast. Multiplanar CT image reconstructions of the cervical spine were also generated. COMPARISON:  CT head 07/25/2019 FINDINGS: CT HEAD FINDINGS Brain: Interval development of bilateral subdural hygromas. These are slightly more dense than CSF and likely are due to acute injury. No high-density hemorrhage identified. Generalized atrophy. Moderate chronic microvascular ischemic change in the white matter. Negative for acute infarct or mass. Ventricle size not enlarged. Vascular: Negative for hyperdense vessel Skull: Negative Sinuses/Orbits: Paranasal sinuses clear. No orbital lesion. Proptosis right greater than left. Other: None CT CERVICAL SPINE FINDINGS Alignment: Normal Skull base and vertebrae: Negative for fracture Soft tissues and spinal canal: Atherosclerotic calcification. No soft tissue swelling or mass. Disc levels: Multilevel disc and facet degeneration. Mild foraminal narrowing bilaterally at  C3-4. Moderate left foraminal narrowing at C4-5. Mild foraminal narrowing at C5-6 and C6-7 Upper chest: Small left effusion. Other: None  IMPRESSION: 1. Interval development of bilateral subdural hygromas measuring approximately 6 mm. These are likely due to acute injury. No high density blood products identified. No midline shift. 2. Atrophy and chronic microvascular ischemia 3. Cervical spondylosis.  Negative for fracture. Electronically Signed   By: Franchot Gallo M.D.   On: 08/13/2019 12:55   DG Pelvis Portable  Result Date: 08/13/2019 CLINICAL DATA:  Fall EXAM: PORTABLE PELVIS 1-2 VIEWS COMPARISON:  None. FINDINGS: There is no evidence of pelvic fracture or diastasis. No pelvic bone lesions are seen. Atherosclerotic calcification. IMPRESSION: Negative. Electronically Signed   By: Franchot Gallo M.D.   On: 08/13/2019 12:33   DG Chest Portable 1 View  Result Date: 08/13/2019 CLINICAL DATA:  Fall EXAM: PORTABLE CHEST 1 VIEW COMPARISON:  07/30/2019 FINDINGS: Progression of left lower lobe airspace disease. Possible atelectasis or pneumonia. Right lung is clear. Dual lead pacemaker unchanged in position. Cardiac enlargement without heart failure or edema. No acute skeletal abnormality. IMPRESSION: Progression of left lower lobe atelectasis/infiltrate. Electronically Signed   By: Franchot Gallo M.D.   On: 08/13/2019 12:32    Procedures Procedures (including critical care time)  Medications Ordered in ED Medications - No data to display  ED Course  I have reviewed the triage vital signs and the nursing notes.  Pertinent labs & imaging results that were available during my care of the patient were reviewed by me and considered in my medical decision making (see chart for details).  Clinical Course as of Aug 12 1513  Mon Aug 13, 2019  [address] [place] daughter   [BM]  1411 Dr. Zada Finders   [BM]    Clinical Course User Index [BM] Gari Crown   MDM Rules/Calculators/A&P                     Additional history obtained from patient's daughter [name], this was an unwitnessed fall yesterday they did not notice a  small abrasion to the patient's forehead until this morning.  Patient is on Eliquis.  No recent illness or other concerns today.  Therefore that the patient has been mentating at baseline throughout the day.  Normally only alert and oriented x2. - CBG 193  DG pelvis:    IMPRESSION:  Negative.   DG Chest:  IMPRESSION:  Progression of left lower lobe atelectasis/infiltrate.  Discussed atelectasis versus pneumonia with Dr. Verner Chol.  Patient without respiratory symptoms, afebrile no tachycardia cough or other infectious type symptoms, likely atelectasis doubt infectious process.  CT head:  IMPRESSION:  1. Interval development of bilateral subdural hygromas measuring  approximately 6 mm. These are likely due to acute injury. No high  density blood products identified. No midline shift.  2. Atrophy and chronic microvascular ischemia  3. Cervical spondylosis. Negative for fracture.    CT Cervical Spine:  IMPRESSION:  1. Interval development of bilateral subdural hygromas measuring  approximately 6 mm. These are likely due to acute injury. No high  density blood products identified. No midline shift.  2. Atrophy and chronic microvascular ischemia  3. Cervical spondylosis. Negative for fracture.  - Discussed case with Dr. Venetia Constable at 2:11 PM.  He advises that he will discuss stopping Eliquis with patient's daughter, anticipates discharge.  No indication for blood work. - Discussed case with Dr. Regenia Skeeter, plan of care is to await official recommendations from Dr. Venetia Constable, anticipate discharge  per his recommendations. - Care handoff given to Margarita Mail, PA-C at shift change.  Plan of care is to follow-up on neurosurgery's recommendation regarding Eliquis.    [place]: Portions of this report may have been transcribed using voice recognition software. Every effort was made to ensure accuracy; however, inadvertent computerized transcription errors may still be present.] Final  Clinical Impression(s) / ED Diagnoses Final diagnoses:  None    Rx / DC Orders ED Discharge Orders    None       Gari Crown 08/13/19 1552    Sherwood Gambler, MD 08/17/19 [phone]

## 2019-08-13 NOTE — ED Notes (Signed)
No LOC reported from fall

## 2019-08-14 ENCOUNTER — Telehealth (HOSPITAL_COMMUNITY): Payer: Self-pay | Admitting: *Deleted

## 2019-08-14 DIAGNOSIS — E1129 Type 2 diabetes mellitus with other diabetic kidney complication: Secondary | ICD-10-CM | POA: Diagnosis not present

## 2019-08-14 DIAGNOSIS — N184 Chronic kidney disease, stage 4 (severe): Secondary | ICD-10-CM | POA: Diagnosis not present

## 2019-08-14 DIAGNOSIS — Z8616 Personal history of COVID-19: Secondary | ICD-10-CM | POA: Diagnosis not present

## 2019-08-14 DIAGNOSIS — I6200 Nontraumatic subdural hemorrhage, unspecified: Secondary | ICD-10-CM | POA: Diagnosis not present

## 2019-08-14 DIAGNOSIS — F0150 Vascular dementia without behavioral disturbance: Secondary | ICD-10-CM | POA: Diagnosis not present

## 2019-08-14 DIAGNOSIS — Z8679 Personal history of other diseases of the circulatory system: Secondary | ICD-10-CM | POA: Diagnosis not present

## 2019-08-14 DIAGNOSIS — D509 Iron deficiency anemia, unspecified: Secondary | ICD-10-CM | POA: Diagnosis not present

## 2019-08-14 DIAGNOSIS — R790 Abnormal level of blood mineral: Secondary | ICD-10-CM | POA: Diagnosis not present

## 2019-08-14 DIAGNOSIS — N2581 Secondary hyperparathyroidism of renal origin: Secondary | ICD-10-CM | POA: Diagnosis not present

## 2019-08-14 NOTE — Telephone Encounter (Signed)
Pt's PCP called to let us know that pt fell yesterday and has a subdural hematoma, Eliquis has been stopped for now and pt is sch to see neurosurgery on Thur 5/20. They just wanted Dr Aundra Dubin and [facility] to be aware, info sent to them

## 2019-08-15 DIAGNOSIS — D631 Anemia in chronic kidney disease: Secondary | ICD-10-CM | POA: Diagnosis not present

## 2019-08-15 DIAGNOSIS — N186 End stage renal disease: Secondary | ICD-10-CM | POA: Diagnosis not present

## 2019-08-15 DIAGNOSIS — N2581 Secondary hyperparathyroidism of renal origin: Secondary | ICD-10-CM | POA: Diagnosis not present

## 2019-08-15 DIAGNOSIS — Z992 Dependence on renal dialysis: Secondary | ICD-10-CM | POA: Diagnosis not present

## 2019-08-15 DIAGNOSIS — D509 Iron deficiency anemia, unspecified: Secondary | ICD-10-CM | POA: Diagnosis not present

## 2019-08-16 ENCOUNTER — Other Ambulatory Visit (HOSPITAL_COMMUNITY): Payer: Self-pay | Admitting: Neurological Surgery

## 2019-08-16 ENCOUNTER — Other Ambulatory Visit: Payer: Self-pay | Admitting: Neurological Surgery

## 2019-08-16 DIAGNOSIS — G9341 Metabolic encephalopathy: Secondary | ICD-10-CM | POA: Diagnosis not present

## 2019-08-16 DIAGNOSIS — S065X9A Traumatic subdural hemorrhage with loss of consciousness of unspecified duration, initial encounter: Secondary | ICD-10-CM | POA: Diagnosis not present

## 2019-08-16 DIAGNOSIS — I132 Hypertensive heart and chronic kidney disease with heart failure and with stage 5 chronic kidney disease, or end stage renal disease: Secondary | ICD-10-CM | POA: Diagnosis not present

## 2019-08-16 DIAGNOSIS — S065XAA Traumatic subdural hemorrhage with loss of consciousness status unknown, initial encounter: Secondary | ICD-10-CM

## 2019-08-16 DIAGNOSIS — J189 Pneumonia, unspecified organism: Secondary | ICD-10-CM | POA: Diagnosis not present

## 2019-08-16 DIAGNOSIS — I0981 Rheumatic heart failure: Secondary | ICD-10-CM | POA: Diagnosis not present

## 2019-08-16 DIAGNOSIS — J440 Chronic obstructive pulmonary disease with acute lower respiratory infection: Secondary | ICD-10-CM | POA: Diagnosis not present

## 2019-08-16 DIAGNOSIS — I2510 Atherosclerotic heart disease of native coronary artery without angina pectoris: Secondary | ICD-10-CM | POA: Diagnosis not present

## 2019-08-17 ENCOUNTER — Ambulatory Visit (INDEPENDENT_AMBULATORY_CARE_PROVIDER_SITE_OTHER): Payer: Medicare Other | Admitting: *Deleted

## 2019-08-17 DIAGNOSIS — N2581 Secondary hyperparathyroidism of renal origin: Secondary | ICD-10-CM | POA: Diagnosis not present

## 2019-08-17 DIAGNOSIS — I132 Hypertensive heart and chronic kidney disease with heart failure and with stage 5 chronic kidney disease, or end stage renal disease: Secondary | ICD-10-CM | POA: Diagnosis not present

## 2019-08-17 DIAGNOSIS — D631 Anemia in chronic kidney disease: Secondary | ICD-10-CM | POA: Diagnosis not present

## 2019-08-17 DIAGNOSIS — G9341 Metabolic encephalopathy: Secondary | ICD-10-CM | POA: Diagnosis not present

## 2019-08-17 DIAGNOSIS — J440 Chronic obstructive pulmonary disease with acute lower respiratory infection: Secondary | ICD-10-CM | POA: Diagnosis not present

## 2019-08-17 DIAGNOSIS — I2510 Atherosclerotic heart disease of native coronary artery without angina pectoris: Secondary | ICD-10-CM | POA: Diagnosis not present

## 2019-08-17 DIAGNOSIS — I0981 Rheumatic heart failure: Secondary | ICD-10-CM | POA: Diagnosis not present

## 2019-08-17 DIAGNOSIS — J189 Pneumonia, unspecified organism: Secondary | ICD-10-CM | POA: Diagnosis not present

## 2019-08-17 DIAGNOSIS — N186 End stage renal disease: Secondary | ICD-10-CM | POA: Diagnosis not present

## 2019-08-17 DIAGNOSIS — D509 Iron deficiency anemia, unspecified: Secondary | ICD-10-CM | POA: Diagnosis not present

## 2019-08-17 DIAGNOSIS — Z992 Dependence on renal dialysis: Secondary | ICD-10-CM | POA: Diagnosis not present

## 2019-08-17 DIAGNOSIS — I442 Atrioventricular block, complete: Secondary | ICD-10-CM

## 2019-08-17 LAB — CUP PACEART REMOTE DEVICE CHECK
Battery Remaining Longevity: 116 mo
Battery Remaining Percentage: 95.5 %
Battery Voltage: 3.01 V
Brady Statistic AP VP Percent: 89 %
Brady Statistic AP VS Percent: 1 %
Brady Statistic AS VP Percent: 8.7 %
Brady Statistic AS VS Percent: 2.1 %
Brady Statistic RA Percent Paced: 72 %
Brady Statistic RV Percent Paced: 98 %
Date Time Interrogation Session: 20210521020016
Implantable Lead Implant Date: 20201113
Implantable Lead Implant Date: 20201113
Implantable Lead Location: 753859
Implantable Lead Location: 753860
Implantable Pulse Generator Implant Date: 20201113
Lead Channel Impedance Value: 440 Ohm
Lead Channel Impedance Value: 440 Ohm
Lead Channel Pacing Threshold Amplitude: 0.625 V
Lead Channel Pacing Threshold Amplitude: 0.75 V
Lead Channel Pacing Threshold Pulse Width: 0.4 ms
Lead Channel Pacing Threshold Pulse Width: 0.4 ms
Lead Channel Sensing Intrinsic Amplitude: 11 mV
Lead Channel Sensing Intrinsic Amplitude: 5 mV
Lead Channel Setting Pacing Amplitude: 1 V
Lead Channel Setting Pacing Amplitude: 1.625
Lead Channel Setting Pacing Pulse Width: 0.4 ms
Lead Channel Setting Sensing Sensitivity: 2 mV
Pulse Gen Model: 2272
Pulse Gen Serial Number: 9178446

## 2019-08-18 ENCOUNTER — Encounter: Payer: Self-pay | Admitting: Podiatry

## 2019-08-18 NOTE — Progress Notes (Signed)
Subjective: Jeffrey Campbell presents today referred by [name], MD.  Patient is known to have history of vascular dementia.  His daughter and granddaughter present during the visit.  His daughter states that patient is not diabetic. She is not sure of why he is here.  She brought him at the request of her mother.  She does relate that her mother has attempted to cut her dad's toenails and has not been successful at getting them adequately adequately trimmed.  They voiced no other concerns.  Per chart review, he has a diagnosis of diet controlled diabetes as far back as 2015.  He also has diagnosis on chart of prediabetes.  Past Medical History:  Diagnosis Date  . Anemia    low iron  . BPH (benign prostatic hyperplasia)   . CAD (coronary artery disease)   . Carotid artery stenosis    1-39% bilateral carotid artery stenosis and < 50% stenosis in the right CCA by dopplers 06/2017  . CKD (chronic kidney disease), stage III    Stage 4  . Diastolic dysfunction   . Glaucoma   . Graves disease   . History of ETOH abuse   . Hyperlipemia   . Hypertension   . Hyperthyroidism 08/26/10   radioactive iodine therapy   . Memory loss   . Mitral regurgitation echo 2015   mild  . Multiple thyroid nodules   . MVP (mitral valve prolapse) 11/2012   posterior MVP  . OSA (obstructive sleep apnea)    upper airway resistance syndrome with RDI 18/hr - not on CPAP due to insurance not covering  . Pre-diabetes   . PUD (peptic ulcer disease)   . Pulmonary hypertension ([place]) echo 2015   Group 2 with pulmonary venous HTN and Group 3 with OSA  . Upper airway resistance syndrome      Patient Active Problem List   Diagnosis Date Noted  . Delirium   . Palliative care encounter   . Hypertension   . ESRD (end stage renal disease) ([place])   . Elevated troponin   . Complete heart block ([place]) 02/08/2019  . Atrial flutter ([place])   . Dementia without behavioral disturbance ([place]) 09/18/2018  . Goals of care,  counseling/discussion   . Palliative care by specialist   . DNR (do not resuscitate) discussion   . Acute encephalopathy 09/01/2018  . Hypothermia 09/01/2018  . GI bleeding 10/15/2016  . GI bleed 10/15/2016  . Anemia of chronic disease   . CKD (chronic kidney disease), stage V ([place])   . Hypertensive urgency   . Vascular dementia with behavioral disturbance ([place]) 09/24/2016  . Acute metabolic encephalopathy   . Branch retinal vein occlusion of right eye 05/07/2016  . Graves' orbitopathy 05/07/2016  . Primary open angle glaucoma of left eye, mild stage 05/07/2016  . Primary open angle glaucoma of right eye, severe stage 05/07/2016  . PAD (peripheral artery disease) ([place]) 04/20/2016  . Bilateral renal artery stenosis ([place]) 02/18/2015  . Pulmonary hypertension ([place]) 02/10/2015  . Chronic diastolic CHF (congestive heart failure) ([place]) 02/10/2015  . Renal bruit 01/07/2015  . Carotid artery stenosis   . Diastolic dysfunction   . Bradycardia 05/29/2013  . Glaucoma 04/09/2011  . COPD (chronic obstructive pulmonary disease) ([place]) 09/21/2010  . OSA (obstructive sleep apnea)   . Upper airway resistance syndrome   . Hypothyroidism   . Type 2 diabetes, diet controlled ([place])   . CAD (coronary artery disease)   . Hyperlipemia   . Hypertensive  heart and chronic kidney disease with heart failure and stage 1 through stage 4 chronic kidney disease, or chronic kidney disease ([place])   . PUD (peptic ulcer disease)      Past Surgical History:  Procedure Laterality Date  . APPENDECTOMY    . AV FISTULA PLACEMENT Left 10/18/2017   Procedure: ARTERIOVENOUS (AV) FISTULA CREATION ARM;  Surgeon: Waynetta Sandy, MD;  Location: [facility];  Service: Vascular;  Laterality: Left;  . CARDIAC CATHETERIZATION    . CARDIAC CATHETERIZATION N/A 02/17/2015   Procedure: Right Heart Cath;  Surgeon: Larey Dresser, MD;  Location: [facility];  Service: Cardiovascular;  Laterality: N/A;  . CARDIOVERSION  N/A 02/08/2019   Procedure: CARDIOVERSION;  Surgeon: Donato Heinz, MD;  Location[facility];  Service: Endoscopy;  Laterality: N/A;  . ESOPHAGOGASTRODUODENOSCOPY (EGD) WITH PROPOFOL Left 10/16/2016   Procedure: ESOPHAGOGASTRODUODENOSCOPY (EGD) WITH PROPOFOL;  Surgeon: Ronnette Juniper, MD;  Location: [facility];  Service: Gastroenterology;  Laterality: Left;  . heart catherization    . HERNIA REPAIR  10/2009  . IR RADIOLOGIST EVAL & MGMT  09/28/2016  . IR RADIOLOGIST EVAL & MGMT  10/19/2016  . IR RADIOLOGIST EVAL & MGMT  01/18/2017  . PACEMAKER IMPLANT N/A 02/09/2019   Procedure: PACEMAKER IMPLANT;  Surgeon: Constance Haw, MD;  Location: [facility];  Service: Cardiovascular;  Laterality: N/A;  . RIGHT HEART CATH N/A 07/28/2016   Procedure: Right Heart Cath;  Surgeon: Larey Dresser, MD;  Location: [facility];  Service: Cardiovascular;  Laterality: N/A;     Current Outpatient Medications on File Prior to Visit  Medication Sig Dispense Refill  . amLODipine (NORVASC) 10 MG tablet TAKE 1 TABLET BY MOUTH EVERY DAY (Patient not taking: Reported on 08/13/2019) 90 tablet 1  . apixaban (ELIQUIS) 2.5 MG TABS tablet Take 1 tablet (2.5 mg total) by mouth 2 (two) times daily. 180 tablet 3  . atorvastatin (LIPITOR) 20 MG tablet Take 1 tablet (20 mg total) by mouth daily. 30 tablet 3  . Brinzolamide-Brimonidine (SIMBRINZA) 1-0.2 % SUSP Place 1 drop into both eyes 2 (two) times daily.     Marland Kitchen donepezil (ARICEPT) 10 MG tablet Take 1 tablet (10 mg total) by mouth at bedtime. 30 tablet 11  . doxazosin (CARDURA) 4 MG tablet Take 1 tablet (4 mg total) by mouth daily. 30 tablet 3  . Epoetin Alfa (PROCRIT IJ) Inject 5,000 Units as directed every 14 (fourteen) days.     . hydrALAZINE (APRESOLINE) 100 MG tablet Take 1 tablet (100 mg total) by mouth 3 (three) times daily. Please make yearly appt with Dr. Radford Pax for October. 1st attempt (Patient not taking: Reported on 08/13/2019) 90 tablet 3   . levothyroxine (SYNTHROID, LEVOTHROID) 150 MCG tablet Take 150 mcg by mouth daily before breakfast.    . memantine (NAMENDA) 10 MG tablet Take 1 tablet (10 mg total) by mouth 2 (two) times daily. 180 tablet 3  . NON FORMULARY Heparin Sodium (Porcine) 1,000 Units/mL Systemic    . Tuberculin PPD (TUBERSOL ID) Inject into the skin.     No current facility-administered medications on file prior to visit.     Allergies  Allergen Reactions  . Iodinated Diagnostic Agents Other (See Comments)    Right heart cath 07/2016 allegedly "shut down his kidneys" Patient has stage III chronic kidney disease  . Zocor [Simvastatin] Other (See Comments)    Liver problems   . Calcitriol Other (See Comments)  . Budesonide-Formoterol Fumarate  Other (See Comments)    Dry mouth  . Minoxidil Other (See Comments)    Unknown raction  . Tiotropium Bromide Monohydrate Other (See Comments)    Dry mouth  . Tricor [Fenofibrate]     Unknown reaction     Social History   Occupational History  . 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 Smoking status: Former Smoker    Packs/day: 2.00    Years: 40.00    Pack years: 80.00    Types: Cigarettes    Quit date: 03/29/1984    Years since quitting: 35.4  . Smokeless tobacco: Never Used  Substance and Sexual Activity  . Alcohol use: No    Comment: quit in 1981  . Drug use: No  . Sexual activity: Not on file     Family History  Problem Relation Age of Onset  . Kidney failure Father   . Hypertension Father   . Colon cancer Mother   . Colon cancer Brother   . Lung disease Brother   . Colon cancer Maternal Uncle      Immunization History  Administered Date(s) Administered  . Fluad Quad(high Dose 65+) 01/29/2019, 02/10/2019  . Influenza Whole 01/26/2011  . Influenza,inj,Quad PF,6+ Mos 02/08/2014, 03/18/2015  . Pneumococcal Polysaccharide-23 02/25/2009, 02/10/2019  . Tdap 08/13/2019     Review of systems:  Positive Findings in bold print.  Constitutional:  chills, fatigue, fever, sweats, weight change Communication: Optometrist, sign Ecologist, hand writing, iPad/Android device Head: headaches, head injury Eyes: changes in vision, eye pain, glaucoma, cataracts, macular degeneration, diplopia, glare,  light sensitivity, eyeglasses or contacts, blindness Ears nose mouth throat: hearing impaired, hearing aids,  ringing in ears, deaf, sign language,  vertigo,   nosebleeds,  rhinitis,  cold sores, snoring, swollen glands Cardiovascular: HTN, edema, arrhythmia, pacemaker in place, defibrillator in place, chest pain/tightness, chronic anticoagulation, blood clot, heart failure, MI Peripheral Vascular: leg cramps, varicose veins, blood clots, lymphedema, varicosities Respiratory:  difficulty breathing, denies congestion, SOB, wheezing, cough, emphysema Gastrointestinal: change in appetite or weight, abdominal pain, constipation, diarrhea, nausea, vomiting, vomiting blood, change in bowel habits, abdominal pain, jaundice, rectal bleeding, hemorrhoids, GERD Genitourinary:  nocturia,  pain on urination, polyuria, chronic kidney disease, blood in urine, Foley catheter, urinary urgency, ESRD on hemodialysis Musculoskeletal: amputation, cramping, stiff joints, painful joints, decreased joint motion, fractures, OA, gout, hemiplegia, paraplegia, uses cane, wheelchair bound, uses walker, uses rollator Skin: +changes in toenails, color change, dryness, itching, mole changes,  rash, wound(s) Neurological: headaches, numbness in feet, paresthesias in feet, burning in feet, fainting,  seizures, change in speech. denies headaches, memory problems/poor historian, cerebral palsy, weakness, paralysis, CVA, TIA Endocrine: diabetes, hypothyroidism, hyperthyroidism,  goiter, dry mouth, flushing, heat intolerance,  cold intolerance,  excessive thirst, denies polyuria,  nocturia Hematological:  easy bleeding, excessive  bleeding, easy bruising, enlarged lymph nodes, on long term blood thinner, history of past transusions Allergy/immunological:  hives, eczema, frequent infections, multiple drug allergies, seasonal allergies, transplant recipient, multiple food allergies Psychiatric:  anxiety, depression, mood disorder, suicidal ideations, hallucinations, insomnia  Objective: Vitals:   08/09/19 1012  BP: (!) 142/67  Pulse: 70  Temp: 97.7 F (36.5 C)    86 y.o. AA male WD, WN IN NAD. AAO X 3.  Vascular Examination: Capillary fill time to digits <3 seconds b/l. Palpable DP pulses b/l. Faintly palpable DP pulses b/l. Pedal hair sparse b/l. Skin temperature gradient within normal limits b/l. No pain  with calf compression b/l.  Dermatological Examination: Pedal skin with normal turgor, texture and tone bilaterally. No open wounds bilaterally. No interdigital macerations bilaterally. Toenails 1-5 b/l elongated, dystrophic, thickened, crumbly with subungual debris and tenderness to dorsal palpation.  Musculoskeletal Examination: Normal muscle strength 5/5 to all lower extremity muscle groups bilaterally. No pain crepitus or joint limitation noted with ROM b/l. Hallux valgus with bunion deformity noted b/l.  Neurological Examination: Protective sensation intact 5/5 intact bilaterally with 10g monofilament b/l. Proprioception intact bilaterally. Babinski reflex negative b/l. Clonus negative b/l.  Assessment: 1. Pain due to onychomycosis of toenails of both feet   2. CKD (chronic kidney disease), stage V (HCC)    Plan: -Examined patient. -Toenails 1-5 b/l were debrided in length and girth with sterile nail nippers and dremel without iatrogenic bleeding.  -Patient to continue soft, supportive shoe gear daily. -Patient to report any pedal injuries to medical professional immediately. -Patient/POA to call should there be question/concern in the interim.  Return in about 3 months (around 11/09/2019) for  diabetic nail trim.

## 2019-08-20 DIAGNOSIS — Z992 Dependence on renal dialysis: Secondary | ICD-10-CM | POA: Diagnosis not present

## 2019-08-20 DIAGNOSIS — N186 End stage renal disease: Secondary | ICD-10-CM | POA: Diagnosis not present

## 2019-08-20 DIAGNOSIS — D631 Anemia in chronic kidney disease: Secondary | ICD-10-CM | POA: Diagnosis not present

## 2019-08-20 DIAGNOSIS — D509 Iron deficiency anemia, unspecified: Secondary | ICD-10-CM | POA: Diagnosis not present

## 2019-08-20 DIAGNOSIS — N2581 Secondary hyperparathyroidism of renal origin: Secondary | ICD-10-CM | POA: Diagnosis not present

## 2019-08-20 NOTE — Progress Notes (Signed)
Remote pacemaker transmission.   

## 2019-08-21 DIAGNOSIS — E46 Unspecified protein-calorie malnutrition: Secondary | ICD-10-CM | POA: Insufficient documentation

## 2019-08-22 ENCOUNTER — Other Ambulatory Visit: Payer: Self-pay

## 2019-08-22 ENCOUNTER — Ambulatory Visit (HOSPITAL_COMMUNITY)
Admission: RE | Admit: 2019-08-22 | Discharge: 2019-08-22 | Disposition: A | Discharge status: Home / Self Care | Payer: Medicare Other | Source: Ambulatory Visit | Attending: Neurological Surgery | Admitting: Neurological Surgery

## 2019-08-22 DIAGNOSIS — S065XAA Traumatic subdural hemorrhage with loss of consciousness status unknown, initial encounter: Secondary | ICD-10-CM

## 2019-08-22 DIAGNOSIS — D509 Iron deficiency anemia, unspecified: Secondary | ICD-10-CM | POA: Diagnosis not present

## 2019-08-22 DIAGNOSIS — N2581 Secondary hyperparathyroidism of renal origin: Secondary | ICD-10-CM | POA: Diagnosis not present

## 2019-08-22 DIAGNOSIS — Z992 Dependence on renal dialysis: Secondary | ICD-10-CM | POA: Diagnosis not present

## 2019-08-22 DIAGNOSIS — D631 Anemia in chronic kidney disease: Secondary | ICD-10-CM | POA: Diagnosis not present

## 2019-08-22 DIAGNOSIS — S065X9A Traumatic subdural hemorrhage with loss of consciousness of unspecified duration, initial encounter: Secondary | ICD-10-CM | POA: Insufficient documentation

## 2019-08-22 DIAGNOSIS — I6200 Nontraumatic subdural hemorrhage, unspecified: Secondary | ICD-10-CM | POA: Diagnosis not present

## 2019-08-22 DIAGNOSIS — N186 End stage renal disease: Secondary | ICD-10-CM | POA: Diagnosis not present

## 2019-08-23 DIAGNOSIS — J440 Chronic obstructive pulmonary disease with acute lower respiratory infection: Secondary | ICD-10-CM | POA: Diagnosis not present

## 2019-08-23 DIAGNOSIS — I0981 Rheumatic heart failure: Secondary | ICD-10-CM | POA: Diagnosis not present

## 2019-08-23 DIAGNOSIS — I2510 Atherosclerotic heart disease of native coronary artery without angina pectoris: Secondary | ICD-10-CM | POA: Diagnosis not present

## 2019-08-23 DIAGNOSIS — J189 Pneumonia, unspecified organism: Secondary | ICD-10-CM | POA: Diagnosis not present

## 2019-08-23 DIAGNOSIS — I132 Hypertensive heart and chronic kidney disease with heart failure and with stage 5 chronic kidney disease, or end stage renal disease: Secondary | ICD-10-CM | POA: Diagnosis not present

## 2019-08-23 DIAGNOSIS — G9341 Metabolic encephalopathy: Secondary | ICD-10-CM | POA: Diagnosis not present

## 2019-08-24 ENCOUNTER — Telehealth: Payer: Self-pay | Admitting: Cardiology

## 2019-08-24 DIAGNOSIS — Z992 Dependence on renal dialysis: Secondary | ICD-10-CM | POA: Diagnosis not present

## 2019-08-24 DIAGNOSIS — N2581 Secondary hyperparathyroidism of renal origin: Secondary | ICD-10-CM | POA: Diagnosis not present

## 2019-08-24 DIAGNOSIS — D509 Iron deficiency anemia, unspecified: Secondary | ICD-10-CM | POA: Diagnosis not present

## 2019-08-24 DIAGNOSIS — D631 Anemia in chronic kidney disease: Secondary | ICD-10-CM | POA: Diagnosis not present

## 2019-08-24 DIAGNOSIS — N186 End stage renal disease: Secondary | ICD-10-CM | POA: Diagnosis not present

## 2019-08-24 NOTE — Telephone Encounter (Signed)
Jeffrey Campbell from [facility] calling to inform the patient has been only weighing himself in the afternoon, because he feels shaky. She states he has had a 5 lb weight gain since 08/04/19, but is not having any swelling and his measurements have gone down. She states he is not having any symptoms. She states she does not need a call back

## 2019-08-27 DIAGNOSIS — Z992 Dependence on renal dialysis: Secondary | ICD-10-CM | POA: Diagnosis not present

## 2019-08-27 DIAGNOSIS — D509 Iron deficiency anemia, unspecified: Secondary | ICD-10-CM | POA: Diagnosis not present

## 2019-08-27 DIAGNOSIS — D631 Anemia in chronic kidney disease: Secondary | ICD-10-CM | POA: Diagnosis not present

## 2019-08-27 DIAGNOSIS — N186 End stage renal disease: Secondary | ICD-10-CM | POA: Diagnosis not present

## 2019-08-27 DIAGNOSIS — N2581 Secondary hyperparathyroidism of renal origin: Secondary | ICD-10-CM | POA: Diagnosis not present

## 2019-08-28 DIAGNOSIS — J440 Chronic obstructive pulmonary disease with acute lower respiratory infection: Secondary | ICD-10-CM | POA: Diagnosis not present

## 2019-08-28 DIAGNOSIS — G9341 Metabolic encephalopathy: Secondary | ICD-10-CM | POA: Diagnosis not present

## 2019-08-28 DIAGNOSIS — J189 Pneumonia, unspecified organism: Secondary | ICD-10-CM | POA: Diagnosis not present

## 2019-08-28 DIAGNOSIS — I2510 Atherosclerotic heart disease of native coronary artery without angina pectoris: Secondary | ICD-10-CM | POA: Diagnosis not present

## 2019-08-28 DIAGNOSIS — I0981 Rheumatic heart failure: Secondary | ICD-10-CM | POA: Diagnosis not present

## 2019-08-28 DIAGNOSIS — I132 Hypertensive heart and chronic kidney disease with heart failure and with stage 5 chronic kidney disease, or end stage renal disease: Secondary | ICD-10-CM | POA: Diagnosis not present

## 2019-08-30 DIAGNOSIS — G9341 Metabolic encephalopathy: Secondary | ICD-10-CM | POA: Diagnosis not present

## 2019-08-30 DIAGNOSIS — J189 Pneumonia, unspecified organism: Secondary | ICD-10-CM | POA: Diagnosis not present

## 2019-08-30 DIAGNOSIS — I132 Hypertensive heart and chronic kidney disease with heart failure and with stage 5 chronic kidney disease, or end stage renal disease: Secondary | ICD-10-CM | POA: Diagnosis not present

## 2019-08-30 DIAGNOSIS — J440 Chronic obstructive pulmonary disease with acute lower respiratory infection: Secondary | ICD-10-CM | POA: Diagnosis not present

## 2019-08-30 DIAGNOSIS — I2510 Atherosclerotic heart disease of native coronary artery without angina pectoris: Secondary | ICD-10-CM | POA: Diagnosis not present

## 2019-08-30 DIAGNOSIS — I0981 Rheumatic heart failure: Secondary | ICD-10-CM | POA: Diagnosis not present

## 2019-08-31 ENCOUNTER — Telehealth: Payer: Self-pay

## 2019-08-31 DIAGNOSIS — I0981 Rheumatic heart failure: Secondary | ICD-10-CM | POA: Diagnosis not present

## 2019-08-31 DIAGNOSIS — I132 Hypertensive heart and chronic kidney disease with heart failure and with stage 5 chronic kidney disease, or end stage renal disease: Secondary | ICD-10-CM | POA: Diagnosis not present

## 2019-08-31 DIAGNOSIS — G9341 Metabolic encephalopathy: Secondary | ICD-10-CM | POA: Diagnosis not present

## 2019-08-31 DIAGNOSIS — I2510 Atherosclerotic heart disease of native coronary artery without angina pectoris: Secondary | ICD-10-CM | POA: Diagnosis not present

## 2019-08-31 DIAGNOSIS — J189 Pneumonia, unspecified organism: Secondary | ICD-10-CM | POA: Diagnosis not present

## 2019-08-31 DIAGNOSIS — J440 Chronic obstructive pulmonary disease with acute lower respiratory infection: Secondary | ICD-10-CM | POA: Diagnosis not present

## 2019-08-31 NOTE — Telephone Encounter (Signed)
Received message that patient's daughter, [name], called requesting a return call. Return call placed to [facility], who shared that patient had been refusing to go to dialysis but did go today. Also, scheduled follow up for 09/06/19 @ 8:30am

## 2019-09-03 DIAGNOSIS — J440 Chronic obstructive pulmonary disease with acute lower respiratory infection: Secondary | ICD-10-CM | POA: Diagnosis not present

## 2019-09-03 DIAGNOSIS — R7303 Prediabetes: Secondary | ICD-10-CM | POA: Diagnosis not present

## 2019-09-03 DIAGNOSIS — I4892 Unspecified atrial flutter: Secondary | ICD-10-CM | POA: Diagnosis not present

## 2019-09-03 DIAGNOSIS — I0981 Rheumatic heart failure: Secondary | ICD-10-CM | POA: Diagnosis not present

## 2019-09-03 DIAGNOSIS — G4733 Obstructive sleep apnea (adult) (pediatric): Secondary | ICD-10-CM | POA: Diagnosis not present

## 2019-09-03 DIAGNOSIS — Z992 Dependence on renal dialysis: Secondary | ICD-10-CM | POA: Diagnosis not present

## 2019-09-03 DIAGNOSIS — Z950 Presence of cardiac pacemaker: Secondary | ICD-10-CM | POA: Diagnosis not present

## 2019-09-03 DIAGNOSIS — E785 Hyperlipidemia, unspecified: Secondary | ICD-10-CM | POA: Diagnosis not present

## 2019-09-03 DIAGNOSIS — I442 Atrioventricular block, complete: Secondary | ICD-10-CM | POA: Diagnosis not present

## 2019-09-03 DIAGNOSIS — I272 Pulmonary hypertension, unspecified: Secondary | ICD-10-CM | POA: Diagnosis not present

## 2019-09-03 DIAGNOSIS — Z9181 History of falling: Secondary | ICD-10-CM | POA: Diagnosis not present

## 2019-09-03 DIAGNOSIS — Z87891 Personal history of nicotine dependence: Secondary | ICD-10-CM | POA: Diagnosis not present

## 2019-09-03 DIAGNOSIS — I2510 Atherosclerotic heart disease of native coronary artery without angina pectoris: Secondary | ICD-10-CM | POA: Diagnosis not present

## 2019-09-03 DIAGNOSIS — N400 Enlarged prostate without lower urinary tract symptoms: Secondary | ICD-10-CM | POA: Diagnosis not present

## 2019-09-03 DIAGNOSIS — G9341 Metabolic encephalopathy: Secondary | ICD-10-CM | POA: Diagnosis not present

## 2019-09-03 DIAGNOSIS — J189 Pneumonia, unspecified organism: Secondary | ICD-10-CM | POA: Diagnosis not present

## 2019-09-03 DIAGNOSIS — D631 Anemia in chronic kidney disease: Secondary | ICD-10-CM | POA: Diagnosis not present

## 2019-09-03 DIAGNOSIS — I081 Rheumatic disorders of both mitral and tricuspid valves: Secondary | ICD-10-CM | POA: Diagnosis not present

## 2019-09-03 DIAGNOSIS — I5032 Chronic diastolic (congestive) heart failure: Secondary | ICD-10-CM | POA: Diagnosis not present

## 2019-09-03 DIAGNOSIS — Z7901 Long term (current) use of anticoagulants: Secondary | ICD-10-CM | POA: Diagnosis not present

## 2019-09-03 DIAGNOSIS — E039 Hypothyroidism, unspecified: Secondary | ICD-10-CM | POA: Diagnosis not present

## 2019-09-03 DIAGNOSIS — F0280 Dementia in other diseases classified elsewhere without behavioral disturbance: Secondary | ICD-10-CM | POA: Diagnosis not present

## 2019-09-03 DIAGNOSIS — N186 End stage renal disease: Secondary | ICD-10-CM | POA: Diagnosis not present

## 2019-09-03 DIAGNOSIS — I132 Hypertensive heart and chronic kidney disease with heart failure and with stage 5 chronic kidney disease, or end stage renal disease: Secondary | ICD-10-CM | POA: Diagnosis not present

## 2019-09-03 DIAGNOSIS — I739 Peripheral vascular disease, unspecified: Secondary | ICD-10-CM | POA: Diagnosis not present

## 2019-09-04 DIAGNOSIS — J440 Chronic obstructive pulmonary disease with acute lower respiratory infection: Secondary | ICD-10-CM | POA: Diagnosis not present

## 2019-09-04 DIAGNOSIS — J189 Pneumonia, unspecified organism: Secondary | ICD-10-CM | POA: Diagnosis not present

## 2019-09-04 DIAGNOSIS — G9341 Metabolic encephalopathy: Secondary | ICD-10-CM | POA: Diagnosis not present

## 2019-09-04 DIAGNOSIS — I0981 Rheumatic heart failure: Secondary | ICD-10-CM | POA: Diagnosis not present

## 2019-09-04 DIAGNOSIS — I2510 Atherosclerotic heart disease of native coronary artery without angina pectoris: Secondary | ICD-10-CM | POA: Diagnosis not present

## 2019-09-04 DIAGNOSIS — I132 Hypertensive heart and chronic kidney disease with heart failure and with stage 5 chronic kidney disease, or end stage renal disease: Secondary | ICD-10-CM | POA: Diagnosis not present

## 2019-09-06 ENCOUNTER — Other Ambulatory Visit: Payer: Self-pay

## 2019-09-06 ENCOUNTER — Other Ambulatory Visit: Payer: Medicare Other | Admitting: Internal Medicine

## 2019-09-11 DIAGNOSIS — I132 Hypertensive heart and chronic kidney disease with heart failure and with stage 5 chronic kidney disease, or end stage renal disease: Secondary | ICD-10-CM | POA: Diagnosis not present

## 2019-09-11 DIAGNOSIS — I0981 Rheumatic heart failure: Secondary | ICD-10-CM | POA: Diagnosis not present

## 2019-09-11 DIAGNOSIS — J189 Pneumonia, unspecified organism: Secondary | ICD-10-CM | POA: Diagnosis not present

## 2019-09-11 DIAGNOSIS — J440 Chronic obstructive pulmonary disease with acute lower respiratory infection: Secondary | ICD-10-CM | POA: Diagnosis not present

## 2019-09-11 DIAGNOSIS — I2510 Atherosclerotic heart disease of native coronary artery without angina pectoris: Secondary | ICD-10-CM | POA: Diagnosis not present

## 2019-09-11 DIAGNOSIS — G9341 Metabolic encephalopathy: Secondary | ICD-10-CM | POA: Diagnosis not present

## 2019-09-13 DIAGNOSIS — G9341 Metabolic encephalopathy: Secondary | ICD-10-CM | POA: Diagnosis not present

## 2019-09-13 DIAGNOSIS — I0981 Rheumatic heart failure: Secondary | ICD-10-CM | POA: Diagnosis not present

## 2019-09-13 DIAGNOSIS — I2510 Atherosclerotic heart disease of native coronary artery without angina pectoris: Secondary | ICD-10-CM | POA: Diagnosis not present

## 2019-09-13 DIAGNOSIS — J440 Chronic obstructive pulmonary disease with acute lower respiratory infection: Secondary | ICD-10-CM | POA: Diagnosis not present

## 2019-09-13 DIAGNOSIS — J189 Pneumonia, unspecified organism: Secondary | ICD-10-CM | POA: Diagnosis not present

## 2019-09-13 DIAGNOSIS — I132 Hypertensive heart and chronic kidney disease with heart failure and with stage 5 chronic kidney disease, or end stage renal disease: Secondary | ICD-10-CM | POA: Diagnosis not present

## 2019-09-20 DIAGNOSIS — I2510 Atherosclerotic heart disease of native coronary artery without angina pectoris: Secondary | ICD-10-CM | POA: Diagnosis not present

## 2019-09-20 DIAGNOSIS — G9341 Metabolic encephalopathy: Secondary | ICD-10-CM | POA: Diagnosis not present

## 2019-09-20 DIAGNOSIS — I0981 Rheumatic heart failure: Secondary | ICD-10-CM | POA: Diagnosis not present

## 2019-09-20 DIAGNOSIS — I132 Hypertensive heart and chronic kidney disease with heart failure and with stage 5 chronic kidney disease, or end stage renal disease: Secondary | ICD-10-CM | POA: Diagnosis not present

## 2019-09-20 DIAGNOSIS — J440 Chronic obstructive pulmonary disease with acute lower respiratory infection: Secondary | ICD-10-CM | POA: Diagnosis not present

## 2019-09-20 DIAGNOSIS — J189 Pneumonia, unspecified organism: Secondary | ICD-10-CM | POA: Diagnosis not present

## 2019-09-25 DIAGNOSIS — J440 Chronic obstructive pulmonary disease with acute lower respiratory infection: Secondary | ICD-10-CM | POA: Diagnosis not present

## 2019-09-25 DIAGNOSIS — J189 Pneumonia, unspecified organism: Secondary | ICD-10-CM | POA: Diagnosis not present

## 2019-09-25 DIAGNOSIS — I132 Hypertensive heart and chronic kidney disease with heart failure and with stage 5 chronic kidney disease, or end stage renal disease: Secondary | ICD-10-CM | POA: Diagnosis not present

## 2019-09-25 DIAGNOSIS — I2510 Atherosclerotic heart disease of native coronary artery without angina pectoris: Secondary | ICD-10-CM | POA: Diagnosis not present

## 2019-09-25 DIAGNOSIS — I0981 Rheumatic heart failure: Secondary | ICD-10-CM | POA: Diagnosis not present

## 2019-09-25 DIAGNOSIS — G9341 Metabolic encephalopathy: Secondary | ICD-10-CM | POA: Diagnosis not present

## 2019-09-27 DIAGNOSIS — I0981 Rheumatic heart failure: Secondary | ICD-10-CM | POA: Diagnosis not present

## 2019-09-27 DIAGNOSIS — N186 End stage renal disease: Secondary | ICD-10-CM | POA: Diagnosis not present

## 2019-09-27 DIAGNOSIS — J440 Chronic obstructive pulmonary disease with acute lower respiratory infection: Secondary | ICD-10-CM | POA: Diagnosis not present

## 2019-09-27 DIAGNOSIS — J189 Pneumonia, unspecified organism: Secondary | ICD-10-CM | POA: Diagnosis not present

## 2019-09-27 DIAGNOSIS — N2581 Secondary hyperparathyroidism of renal origin: Secondary | ICD-10-CM | POA: Diagnosis not present

## 2019-09-27 DIAGNOSIS — D509 Iron deficiency anemia, unspecified: Secondary | ICD-10-CM | POA: Diagnosis not present

## 2019-09-27 DIAGNOSIS — N269 Renal sclerosis, unspecified: Secondary | ICD-10-CM | POA: Diagnosis not present

## 2019-09-27 DIAGNOSIS — G9341 Metabolic encephalopathy: Secondary | ICD-10-CM | POA: Diagnosis not present

## 2019-09-27 DIAGNOSIS — I2510 Atherosclerotic heart disease of native coronary artery without angina pectoris: Secondary | ICD-10-CM | POA: Diagnosis not present

## 2019-09-27 DIAGNOSIS — I132 Hypertensive heart and chronic kidney disease with heart failure and with stage 5 chronic kidney disease, or end stage renal disease: Secondary | ICD-10-CM | POA: Diagnosis not present

## 2019-09-27 DIAGNOSIS — Z992 Dependence on renal dialysis: Secondary | ICD-10-CM | POA: Diagnosis not present

## 2019-09-28 DIAGNOSIS — Z992 Dependence on renal dialysis: Secondary | ICD-10-CM | POA: Diagnosis not present

## 2019-09-28 DIAGNOSIS — N2581 Secondary hyperparathyroidism of renal origin: Secondary | ICD-10-CM | POA: Diagnosis not present

## 2019-09-28 DIAGNOSIS — N186 End stage renal disease: Secondary | ICD-10-CM | POA: Diagnosis not present

## 2019-09-28 DIAGNOSIS — D509 Iron deficiency anemia, unspecified: Secondary | ICD-10-CM | POA: Diagnosis not present

## 2019-09-29 ENCOUNTER — Other Ambulatory Visit (HOSPITAL_COMMUNITY): Payer: Self-pay | Admitting: Cardiology

## 2019-10-01 DIAGNOSIS — Z992 Dependence on renal dialysis: Secondary | ICD-10-CM | POA: Diagnosis not present

## 2019-10-01 DIAGNOSIS — D509 Iron deficiency anemia, unspecified: Secondary | ICD-10-CM | POA: Diagnosis not present

## 2019-10-01 DIAGNOSIS — N2581 Secondary hyperparathyroidism of renal origin: Secondary | ICD-10-CM | POA: Diagnosis not present

## 2019-10-01 DIAGNOSIS — N186 End stage renal disease: Secondary | ICD-10-CM | POA: Diagnosis not present

## 2019-10-02 DIAGNOSIS — I132 Hypertensive heart and chronic kidney disease with heart failure and with stage 5 chronic kidney disease, or end stage renal disease: Secondary | ICD-10-CM | POA: Diagnosis not present

## 2019-10-02 DIAGNOSIS — I0981 Rheumatic heart failure: Secondary | ICD-10-CM | POA: Diagnosis not present

## 2019-10-02 DIAGNOSIS — J440 Chronic obstructive pulmonary disease with acute lower respiratory infection: Secondary | ICD-10-CM | POA: Diagnosis not present

## 2019-10-02 DIAGNOSIS — G9341 Metabolic encephalopathy: Secondary | ICD-10-CM | POA: Diagnosis not present

## 2019-10-02 DIAGNOSIS — I2510 Atherosclerotic heart disease of native coronary artery without angina pectoris: Secondary | ICD-10-CM | POA: Diagnosis not present

## 2019-10-02 DIAGNOSIS — J189 Pneumonia, unspecified organism: Secondary | ICD-10-CM | POA: Diagnosis not present

## 2019-10-03 DIAGNOSIS — Z9181 History of falling: Secondary | ICD-10-CM | POA: Diagnosis not present

## 2019-10-03 DIAGNOSIS — F0280 Dementia in other diseases classified elsewhere without behavioral disturbance: Secondary | ICD-10-CM | POA: Diagnosis not present

## 2019-10-03 DIAGNOSIS — G9341 Metabolic encephalopathy: Secondary | ICD-10-CM | POA: Diagnosis not present

## 2019-10-03 DIAGNOSIS — E785 Hyperlipidemia, unspecified: Secondary | ICD-10-CM | POA: Diagnosis not present

## 2019-10-03 DIAGNOSIS — I442 Atrioventricular block, complete: Secondary | ICD-10-CM | POA: Diagnosis not present

## 2019-10-03 DIAGNOSIS — D509 Iron deficiency anemia, unspecified: Secondary | ICD-10-CM | POA: Diagnosis not present

## 2019-10-03 DIAGNOSIS — N2581 Secondary hyperparathyroidism of renal origin: Secondary | ICD-10-CM | POA: Diagnosis not present

## 2019-10-03 DIAGNOSIS — D631 Anemia in chronic kidney disease: Secondary | ICD-10-CM | POA: Diagnosis not present

## 2019-10-03 DIAGNOSIS — E039 Hypothyroidism, unspecified: Secondary | ICD-10-CM | POA: Diagnosis not present

## 2019-10-03 DIAGNOSIS — R7303 Prediabetes: Secondary | ICD-10-CM | POA: Diagnosis not present

## 2019-10-03 DIAGNOSIS — Z992 Dependence on renal dialysis: Secondary | ICD-10-CM | POA: Diagnosis not present

## 2019-10-03 DIAGNOSIS — N186 End stage renal disease: Secondary | ICD-10-CM | POA: Diagnosis not present

## 2019-10-03 DIAGNOSIS — I4892 Unspecified atrial flutter: Secondary | ICD-10-CM | POA: Diagnosis not present

## 2019-10-03 DIAGNOSIS — J449 Chronic obstructive pulmonary disease, unspecified: Secondary | ICD-10-CM | POA: Diagnosis not present

## 2019-10-03 DIAGNOSIS — I081 Rheumatic disorders of both mitral and tricuspid valves: Secondary | ICD-10-CM | POA: Diagnosis not present

## 2019-10-03 DIAGNOSIS — I132 Hypertensive heart and chronic kidney disease with heart failure and with stage 5 chronic kidney disease, or end stage renal disease: Secondary | ICD-10-CM | POA: Diagnosis not present

## 2019-10-03 DIAGNOSIS — G4733 Obstructive sleep apnea (adult) (pediatric): Secondary | ICD-10-CM | POA: Diagnosis not present

## 2019-10-03 DIAGNOSIS — Z950 Presence of cardiac pacemaker: Secondary | ICD-10-CM | POA: Diagnosis not present

## 2019-10-03 DIAGNOSIS — Z87891 Personal history of nicotine dependence: Secondary | ICD-10-CM | POA: Diagnosis not present

## 2019-10-03 DIAGNOSIS — I5032 Chronic diastolic (congestive) heart failure: Secondary | ICD-10-CM | POA: Diagnosis not present

## 2019-10-03 DIAGNOSIS — Z7901 Long term (current) use of anticoagulants: Secondary | ICD-10-CM | POA: Diagnosis not present

## 2019-10-03 DIAGNOSIS — I0981 Rheumatic heart failure: Secondary | ICD-10-CM | POA: Diagnosis not present

## 2019-10-03 DIAGNOSIS — N400 Enlarged prostate without lower urinary tract symptoms: Secondary | ICD-10-CM | POA: Diagnosis not present

## 2019-10-03 DIAGNOSIS — Z8701 Personal history of pneumonia (recurrent): Secondary | ICD-10-CM | POA: Diagnosis not present

## 2019-10-03 DIAGNOSIS — I2510 Atherosclerotic heart disease of native coronary artery without angina pectoris: Secondary | ICD-10-CM | POA: Diagnosis not present

## 2019-10-03 DIAGNOSIS — I739 Peripheral vascular disease, unspecified: Secondary | ICD-10-CM | POA: Diagnosis not present

## 2019-10-03 DIAGNOSIS — I272 Pulmonary hypertension, unspecified: Secondary | ICD-10-CM | POA: Diagnosis not present

## 2019-10-04 ENCOUNTER — Other Ambulatory Visit: Payer: Medicare Other | Admitting: Internal Medicine

## 2019-10-04 DIAGNOSIS — Z515 Encounter for palliative care: Secondary | ICD-10-CM | POA: Diagnosis not present

## 2019-10-04 DIAGNOSIS — F0151 Vascular dementia with behavioral disturbance: Secondary | ICD-10-CM | POA: Diagnosis not present

## 2019-10-05 DIAGNOSIS — N2581 Secondary hyperparathyroidism of renal origin: Secondary | ICD-10-CM | POA: Diagnosis not present

## 2019-10-05 DIAGNOSIS — Z992 Dependence on renal dialysis: Secondary | ICD-10-CM | POA: Diagnosis not present

## 2019-10-05 DIAGNOSIS — N186 End stage renal disease: Secondary | ICD-10-CM | POA: Diagnosis not present

## 2019-10-05 DIAGNOSIS — D509 Iron deficiency anemia, unspecified: Secondary | ICD-10-CM | POA: Diagnosis not present

## 2019-10-08 DIAGNOSIS — Z992 Dependence on renal dialysis: Secondary | ICD-10-CM | POA: Diagnosis not present

## 2019-10-08 DIAGNOSIS — N186 End stage renal disease: Secondary | ICD-10-CM | POA: Diagnosis not present

## 2019-10-08 DIAGNOSIS — N2581 Secondary hyperparathyroidism of renal origin: Secondary | ICD-10-CM | POA: Diagnosis not present

## 2019-10-08 DIAGNOSIS — D509 Iron deficiency anemia, unspecified: Secondary | ICD-10-CM | POA: Diagnosis not present

## 2019-10-09 DIAGNOSIS — I2510 Atherosclerotic heart disease of native coronary artery without angina pectoris: Secondary | ICD-10-CM | POA: Diagnosis not present

## 2019-10-09 DIAGNOSIS — N186 End stage renal disease: Secondary | ICD-10-CM | POA: Diagnosis not present

## 2019-10-09 DIAGNOSIS — I132 Hypertensive heart and chronic kidney disease with heart failure and with stage 5 chronic kidney disease, or end stage renal disease: Secondary | ICD-10-CM | POA: Diagnosis not present

## 2019-10-09 DIAGNOSIS — I5032 Chronic diastolic (congestive) heart failure: Secondary | ICD-10-CM | POA: Diagnosis not present

## 2019-10-09 DIAGNOSIS — E039 Hypothyroidism, unspecified: Secondary | ICD-10-CM | POA: Diagnosis not present

## 2019-10-09 DIAGNOSIS — I0981 Rheumatic heart failure: Secondary | ICD-10-CM | POA: Diagnosis not present

## 2019-10-09 NOTE — Progress Notes (Addendum)
[facility] Consult Note Telephone: [phone]  Fax: [phone]  PATIENT NAME: Jeffrey Campbell DOB: 1932/07/22 MRN: [id number]  PRIMARY CARE PROVIDER:   [name], MD  REFERRING PROVIDER:  [name], MD [place],  [place] [address]  RESPONSIBLE PARTY:   wife      RECOMMENDATIONS and PLAN:  Palliative care encounter  Z51.5  1.  Advance care planning:  Full scope of treatment at this time.  Wife's goal is to potentially place patient in a memory care facility due to advancement of dementia, elopement with car x 2.  She desires for him to be in a safe environment.    2. Vascular dementia:  FAST stage 6a with progression.  Continue to provide cueing and safe environment.  Wife will be investigating memory care facilities for placement.   3.  ESRD on hemodialysis:  At baseline. Managed by Nephrologist.   Heart healthy meals and hydration per recommendations.  Continue to monitor.  I spent 45 minutes providing this consultation,  from 1300 to 1345 More than 50% of the time in this consultation was spent coordinating communication.   HISTORY OF PRESENT ILLNESS: Follow-up with Jeffrey Campbell . Wife and daughter report that he has increased confusion and is uncooperative at times.  He found a spare set of car keys and drove the car away from home without his wife's knowledge 2 days consecutively. Wife reports that he has tolerated hemodialysis well.  He has lost weight but eats meals very well. Hospital weight in May 2021 was 110#  Today's weight was 109# fully clothed. No reports of falls or injuries.   Palliative Care was asked to help address goals of care.   CODE STATUS: FULL CODE  PPS: 60% HOSPICE ELIGIBILITY/DIAGNOSIS: TBD  PAST MEDICAL HISTORY:  Past Medical History:  Diagnosis Date  . Anemia    low iron  . BPH (benign prostatic hyperplasia)   . CAD (coronary artery disease)   . Carotid artery  stenosis    1-39% bilateral carotid artery stenosis and < 50% stenosis in the right CCA by dopplers 06/2017  . CKD (chronic kidney disease), stage III    Stage 4  . Diastolic dysfunction   . Glaucoma   . Graves disease   . History of ETOH abuse   . Hyperlipemia   . Hypertension   . Hyperthyroidism 08/26/10   radioactive iodine therapy   . Memory loss   . Mitral regurgitation echo 2015   mild  . Multiple thyroid nodules   . MVP (mitral valve prolapse) 11/2012   posterior MVP  . OSA (obstructive sleep apnea)    upper airway resistance syndrome with RDI 18/hr - not on CPAP due to insurance not covering  . Pre-diabetes   . PUD (peptic ulcer disease)   . Pulmonary hypertension ([place]) echo 2015   Group 2 with pulmonary venous HTN and Group 3 with OSA  . Upper airway resistance syndrome     SOCIAL HX: Lives at home with wife Social History   Tobacco Use  . Smoking status: Former Smoker    Packs/day: 2.00    Years: 40.00    Pack years: 80.00    Types: Cigarettes    Quit date: 03/29/1984    Years since quitting: 35.5  . Smokeless tobacco: Never Used  Substance Use Topics  . Alcohol use: No    Comment: quit in 1981    ALLERGIES:  Allergies  Allergen Reactions  . Iodinated Diagnostic Agents Other (See Comments)    Right heart cath 07/2016 allegedly "shut down his kidneys" Patient has stage III chronic kidney disease  . Zocor [Simvastatin] Other (See Comments)    Liver problems   . Calcitriol Other (See Comments)  . Budesonide-Formoterol Fumarate Other (See Comments)    Dry mouth  . Minoxidil Other (See Comments)    Unknown raction  . Tiotropium Bromide Monohydrate Other (See Comments)    Dry mouth  . Tricor [Fenofibrate]     Unknown reaction     PERTINENT MEDICATIONS:  Outpatient Encounter Medications as of 10/04/2019  Medication Sig  . amLODipine (NORVASC) 10 MG tablet TAKE 1 TABLET BY MOUTH EVERY DAY (Patient not taking: Reported on 08/13/2019)  . apixaban (ELIQUIS) 2.5  MG TABS tablet Take 1 tablet (2.5 mg total) by mouth 2 (two) times daily.  Marland Kitchen atorvastatin (LIPITOR) 20 MG tablet TAKE 1 TABLET BY MOUTH EVERY DAY  . Brinzolamide-Brimonidine (SIMBRINZA) 1-0.2 % SUSP Place 1 drop into both eyes 2 (two) times daily.   Marland Kitchen donepezil (ARICEPT) 10 MG tablet Take 1 tablet (10 mg total) by mouth at bedtime.  Marland Kitchen doxazosin (CARDURA) 4 MG tablet Take 1 tablet (4 mg total) by mouth daily.  Marland Kitchen Epoetin Alfa (PROCRIT IJ) Inject 5,000 Units as directed every 14 (fourteen) days.   . folic acid-vitamin b complex-vitamin c-selenium-zinc (DIALYVITE) 3 MG TABS tablet Take 1 tablet by mouth daily.  . hydrALAZINE (APRESOLINE) 100 MG tablet Take 1 tablet (100 mg total) by mouth 3 (three) times daily. Please make yearly appt with Dr. Radford Pax for October. 1st attempt (Patient not taking: Reported on 08/13/2019)  . levothyroxine (SYNTHROID, LEVOTHROID) 150 MCG tablet Take 150 mcg by mouth daily before breakfast.  . memantine (NAMENDA) 10 MG tablet Take 1 tablet (10 mg total) by mouth 2 (two) times daily.  . NON FORMULARY Heparin Sodium (Porcine) 1,000 Units/mL Systemic  . Tuberculin PPD (TUBERSOL ID) Inject into the skin.   No facility-administered encounter medications on file as of 10/04/2019.    PHYSICAL EXAM:   General: NAD, frail appearing, thin elderly male Cardiovascular: regular rate and rhythm Pulmonary: clear throughout Abdomen: soft, nontender, + bowel sounds Vasc.  Good thrill and bruit of L antecubital region Extremities: no edema, little muscle tone Skin: exposed skin is intact.  Warm and moist Neurological: alert and oriented to person and place.   Gonzella Lex, NP-C

## 2019-10-10 DIAGNOSIS — Z992 Dependence on renal dialysis: Secondary | ICD-10-CM | POA: Diagnosis not present

## 2019-10-10 DIAGNOSIS — N186 End stage renal disease: Secondary | ICD-10-CM | POA: Diagnosis not present

## 2019-10-10 DIAGNOSIS — N2581 Secondary hyperparathyroidism of renal origin: Secondary | ICD-10-CM | POA: Diagnosis not present

## 2019-10-10 DIAGNOSIS — D509 Iron deficiency anemia, unspecified: Secondary | ICD-10-CM | POA: Diagnosis not present

## 2019-10-12 DIAGNOSIS — Z992 Dependence on renal dialysis: Secondary | ICD-10-CM | POA: Diagnosis not present

## 2019-10-12 DIAGNOSIS — D509 Iron deficiency anemia, unspecified: Secondary | ICD-10-CM | POA: Diagnosis not present

## 2019-10-12 DIAGNOSIS — N2581 Secondary hyperparathyroidism of renal origin: Secondary | ICD-10-CM | POA: Diagnosis not present

## 2019-10-12 DIAGNOSIS — N186 End stage renal disease: Secondary | ICD-10-CM | POA: Diagnosis not present

## 2019-10-15 DIAGNOSIS — N2581 Secondary hyperparathyroidism of renal origin: Secondary | ICD-10-CM | POA: Diagnosis not present

## 2019-10-15 DIAGNOSIS — Z992 Dependence on renal dialysis: Secondary | ICD-10-CM | POA: Diagnosis not present

## 2019-10-15 DIAGNOSIS — N186 End stage renal disease: Secondary | ICD-10-CM | POA: Diagnosis not present

## 2019-10-15 DIAGNOSIS — D509 Iron deficiency anemia, unspecified: Secondary | ICD-10-CM | POA: Diagnosis not present

## 2019-10-16 DIAGNOSIS — I5032 Chronic diastolic (congestive) heart failure: Secondary | ICD-10-CM | POA: Diagnosis not present

## 2019-10-16 DIAGNOSIS — I132 Hypertensive heart and chronic kidney disease with heart failure and with stage 5 chronic kidney disease, or end stage renal disease: Secondary | ICD-10-CM | POA: Diagnosis not present

## 2019-10-16 DIAGNOSIS — I2510 Atherosclerotic heart disease of native coronary artery without angina pectoris: Secondary | ICD-10-CM | POA: Diagnosis not present

## 2019-10-16 DIAGNOSIS — I0981 Rheumatic heart failure: Secondary | ICD-10-CM | POA: Diagnosis not present

## 2019-10-16 DIAGNOSIS — E039 Hypothyroidism, unspecified: Secondary | ICD-10-CM | POA: Diagnosis not present

## 2019-10-16 DIAGNOSIS — N186 End stage renal disease: Secondary | ICD-10-CM | POA: Diagnosis not present

## 2019-10-17 DIAGNOSIS — N186 End stage renal disease: Secondary | ICD-10-CM | POA: Diagnosis not present

## 2019-10-17 DIAGNOSIS — D509 Iron deficiency anemia, unspecified: Secondary | ICD-10-CM | POA: Diagnosis not present

## 2019-10-17 DIAGNOSIS — N2581 Secondary hyperparathyroidism of renal origin: Secondary | ICD-10-CM | POA: Diagnosis not present

## 2019-10-17 DIAGNOSIS — Z992 Dependence on renal dialysis: Secondary | ICD-10-CM | POA: Diagnosis not present

## 2019-10-18 DIAGNOSIS — I5032 Chronic diastolic (congestive) heart failure: Secondary | ICD-10-CM | POA: Diagnosis not present

## 2019-10-18 DIAGNOSIS — I2510 Atherosclerotic heart disease of native coronary artery without angina pectoris: Secondary | ICD-10-CM | POA: Diagnosis not present

## 2019-10-18 DIAGNOSIS — N186 End stage renal disease: Secondary | ICD-10-CM | POA: Diagnosis not present

## 2019-10-18 DIAGNOSIS — I132 Hypertensive heart and chronic kidney disease with heart failure and with stage 5 chronic kidney disease, or end stage renal disease: Secondary | ICD-10-CM | POA: Diagnosis not present

## 2019-10-18 DIAGNOSIS — E039 Hypothyroidism, unspecified: Secondary | ICD-10-CM | POA: Diagnosis not present

## 2019-10-18 DIAGNOSIS — I0981 Rheumatic heart failure: Secondary | ICD-10-CM | POA: Diagnosis not present

## 2019-10-19 DIAGNOSIS — D509 Iron deficiency anemia, unspecified: Secondary | ICD-10-CM | POA: Diagnosis not present

## 2019-10-19 DIAGNOSIS — N186 End stage renal disease: Secondary | ICD-10-CM | POA: Diagnosis not present

## 2019-10-19 DIAGNOSIS — Z992 Dependence on renal dialysis: Secondary | ICD-10-CM | POA: Diagnosis not present

## 2019-10-19 DIAGNOSIS — N2581 Secondary hyperparathyroidism of renal origin: Secondary | ICD-10-CM | POA: Diagnosis not present

## 2019-10-22 DIAGNOSIS — N186 End stage renal disease: Secondary | ICD-10-CM | POA: Diagnosis not present

## 2019-10-22 DIAGNOSIS — Z992 Dependence on renal dialysis: Secondary | ICD-10-CM | POA: Diagnosis not present

## 2019-10-22 DIAGNOSIS — D509 Iron deficiency anemia, unspecified: Secondary | ICD-10-CM | POA: Diagnosis not present

## 2019-10-22 DIAGNOSIS — N2581 Secondary hyperparathyroidism of renal origin: Secondary | ICD-10-CM | POA: Diagnosis not present

## 2019-10-23 DIAGNOSIS — I132 Hypertensive heart and chronic kidney disease with heart failure and with stage 5 chronic kidney disease, or end stage renal disease: Secondary | ICD-10-CM | POA: Diagnosis not present

## 2019-10-23 DIAGNOSIS — N186 End stage renal disease: Secondary | ICD-10-CM | POA: Diagnosis not present

## 2019-10-23 DIAGNOSIS — I5032 Chronic diastolic (congestive) heart failure: Secondary | ICD-10-CM | POA: Diagnosis not present

## 2019-10-23 DIAGNOSIS — I2510 Atherosclerotic heart disease of native coronary artery without angina pectoris: Secondary | ICD-10-CM | POA: Diagnosis not present

## 2019-10-23 DIAGNOSIS — E039 Hypothyroidism, unspecified: Secondary | ICD-10-CM | POA: Diagnosis not present

## 2019-10-23 DIAGNOSIS — I0981 Rheumatic heart failure: Secondary | ICD-10-CM | POA: Diagnosis not present

## 2019-10-24 DIAGNOSIS — N186 End stage renal disease: Secondary | ICD-10-CM | POA: Diagnosis not present

## 2019-10-24 DIAGNOSIS — D509 Iron deficiency anemia, unspecified: Secondary | ICD-10-CM | POA: Diagnosis not present

## 2019-10-24 DIAGNOSIS — Z992 Dependence on renal dialysis: Secondary | ICD-10-CM | POA: Diagnosis not present

## 2019-10-24 DIAGNOSIS — N2581 Secondary hyperparathyroidism of renal origin: Secondary | ICD-10-CM | POA: Diagnosis not present

## 2019-10-26 DIAGNOSIS — D509 Iron deficiency anemia, unspecified: Secondary | ICD-10-CM | POA: Diagnosis not present

## 2019-10-26 DIAGNOSIS — N2581 Secondary hyperparathyroidism of renal origin: Secondary | ICD-10-CM | POA: Diagnosis not present

## 2019-10-26 DIAGNOSIS — Z992 Dependence on renal dialysis: Secondary | ICD-10-CM | POA: Diagnosis not present

## 2019-10-26 DIAGNOSIS — N186 End stage renal disease: Secondary | ICD-10-CM | POA: Diagnosis not present

## 2019-10-28 DIAGNOSIS — Z992 Dependence on renal dialysis: Secondary | ICD-10-CM | POA: Diagnosis not present

## 2019-10-28 DIAGNOSIS — N186 End stage renal disease: Secondary | ICD-10-CM | POA: Diagnosis not present

## 2019-10-28 DIAGNOSIS — N269 Renal sclerosis, unspecified: Secondary | ICD-10-CM | POA: Diagnosis not present

## 2019-10-29 DIAGNOSIS — N2581 Secondary hyperparathyroidism of renal origin: Secondary | ICD-10-CM | POA: Diagnosis not present

## 2019-10-29 DIAGNOSIS — N186 End stage renal disease: Secondary | ICD-10-CM | POA: Diagnosis not present

## 2019-10-29 DIAGNOSIS — Z992 Dependence on renal dialysis: Secondary | ICD-10-CM | POA: Diagnosis not present

## 2019-10-29 DIAGNOSIS — D509 Iron deficiency anemia, unspecified: Secondary | ICD-10-CM | POA: Diagnosis not present

## 2019-10-30 DIAGNOSIS — I2510 Atherosclerotic heart disease of native coronary artery without angina pectoris: Secondary | ICD-10-CM | POA: Diagnosis not present

## 2019-10-30 DIAGNOSIS — I132 Hypertensive heart and chronic kidney disease with heart failure and with stage 5 chronic kidney disease, or end stage renal disease: Secondary | ICD-10-CM | POA: Diagnosis not present

## 2019-10-30 DIAGNOSIS — E039 Hypothyroidism, unspecified: Secondary | ICD-10-CM | POA: Diagnosis not present

## 2019-10-30 DIAGNOSIS — I5032 Chronic diastolic (congestive) heart failure: Secondary | ICD-10-CM | POA: Diagnosis not present

## 2019-10-30 DIAGNOSIS — I0981 Rheumatic heart failure: Secondary | ICD-10-CM | POA: Diagnosis not present

## 2019-10-30 DIAGNOSIS — N186 End stage renal disease: Secondary | ICD-10-CM | POA: Diagnosis not present

## 2019-10-31 DIAGNOSIS — Z992 Dependence on renal dialysis: Secondary | ICD-10-CM | POA: Diagnosis not present

## 2019-10-31 DIAGNOSIS — N186 End stage renal disease: Secondary | ICD-10-CM | POA: Diagnosis not present

## 2019-10-31 DIAGNOSIS — N2581 Secondary hyperparathyroidism of renal origin: Secondary | ICD-10-CM | POA: Diagnosis not present

## 2019-10-31 DIAGNOSIS — D509 Iron deficiency anemia, unspecified: Secondary | ICD-10-CM | POA: Diagnosis not present

## 2019-11-02 DIAGNOSIS — Z992 Dependence on renal dialysis: Secondary | ICD-10-CM | POA: Diagnosis not present

## 2019-11-02 DIAGNOSIS — N186 End stage renal disease: Secondary | ICD-10-CM | POA: Diagnosis not present

## 2019-11-02 DIAGNOSIS — N2581 Secondary hyperparathyroidism of renal origin: Secondary | ICD-10-CM | POA: Diagnosis not present

## 2019-11-02 DIAGNOSIS — D509 Iron deficiency anemia, unspecified: Secondary | ICD-10-CM | POA: Diagnosis not present

## 2019-11-05 DIAGNOSIS — N2581 Secondary hyperparathyroidism of renal origin: Secondary | ICD-10-CM | POA: Diagnosis not present

## 2019-11-05 DIAGNOSIS — D509 Iron deficiency anemia, unspecified: Secondary | ICD-10-CM | POA: Diagnosis not present

## 2019-11-05 DIAGNOSIS — Z992 Dependence on renal dialysis: Secondary | ICD-10-CM | POA: Diagnosis not present

## 2019-11-05 DIAGNOSIS — N186 End stage renal disease: Secondary | ICD-10-CM | POA: Diagnosis not present

## 2019-11-06 ENCOUNTER — Ambulatory Visit: Payer: Federal, State, Local not specified - PPO | Admitting: Podiatry

## 2019-11-07 DIAGNOSIS — D509 Iron deficiency anemia, unspecified: Secondary | ICD-10-CM | POA: Diagnosis not present

## 2019-11-07 DIAGNOSIS — Z992 Dependence on renal dialysis: Secondary | ICD-10-CM | POA: Diagnosis not present

## 2019-11-07 DIAGNOSIS — N186 End stage renal disease: Secondary | ICD-10-CM | POA: Diagnosis not present

## 2019-11-07 DIAGNOSIS — N2581 Secondary hyperparathyroidism of renal origin: Secondary | ICD-10-CM | POA: Diagnosis not present

## 2019-11-08 DIAGNOSIS — Z961 Presence of intraocular lens: Secondary | ICD-10-CM | POA: Diagnosis not present

## 2019-11-08 DIAGNOSIS — H401113 Primary open-angle glaucoma, right eye, severe stage: Secondary | ICD-10-CM | POA: Diagnosis not present

## 2019-11-08 DIAGNOSIS — H401121 Primary open-angle glaucoma, left eye, mild stage: Secondary | ICD-10-CM | POA: Diagnosis not present

## 2019-11-09 DIAGNOSIS — D509 Iron deficiency anemia, unspecified: Secondary | ICD-10-CM | POA: Diagnosis not present

## 2019-11-09 DIAGNOSIS — Z992 Dependence on renal dialysis: Secondary | ICD-10-CM | POA: Diagnosis not present

## 2019-11-09 DIAGNOSIS — N2581 Secondary hyperparathyroidism of renal origin: Secondary | ICD-10-CM | POA: Diagnosis not present

## 2019-11-09 DIAGNOSIS — N186 End stage renal disease: Secondary | ICD-10-CM | POA: Diagnosis not present

## 2019-11-12 DIAGNOSIS — D509 Iron deficiency anemia, unspecified: Secondary | ICD-10-CM | POA: Diagnosis not present

## 2019-11-12 DIAGNOSIS — N2581 Secondary hyperparathyroidism of renal origin: Secondary | ICD-10-CM | POA: Diagnosis not present

## 2019-11-12 DIAGNOSIS — Z992 Dependence on renal dialysis: Secondary | ICD-10-CM | POA: Diagnosis not present

## 2019-11-12 DIAGNOSIS — N186 End stage renal disease: Secondary | ICD-10-CM | POA: Diagnosis not present

## 2019-11-14 DIAGNOSIS — N186 End stage renal disease: Secondary | ICD-10-CM | POA: Diagnosis not present

## 2019-11-14 DIAGNOSIS — D509 Iron deficiency anemia, unspecified: Secondary | ICD-10-CM | POA: Diagnosis not present

## 2019-11-14 DIAGNOSIS — N2581 Secondary hyperparathyroidism of renal origin: Secondary | ICD-10-CM | POA: Diagnosis not present

## 2019-11-14 DIAGNOSIS — Z992 Dependence on renal dialysis: Secondary | ICD-10-CM | POA: Diagnosis not present

## 2019-11-15 ENCOUNTER — Other Ambulatory Visit: Payer: Self-pay | Admitting: Cardiology

## 2019-11-16 ENCOUNTER — Ambulatory Visit (INDEPENDENT_AMBULATORY_CARE_PROVIDER_SITE_OTHER): Payer: Medicare Other | Admitting: *Deleted

## 2019-11-16 DIAGNOSIS — D509 Iron deficiency anemia, unspecified: Secondary | ICD-10-CM | POA: Diagnosis not present

## 2019-11-16 DIAGNOSIS — N2581 Secondary hyperparathyroidism of renal origin: Secondary | ICD-10-CM | POA: Diagnosis not present

## 2019-11-16 DIAGNOSIS — N186 End stage renal disease: Secondary | ICD-10-CM | POA: Diagnosis not present

## 2019-11-16 DIAGNOSIS — Z992 Dependence on renal dialysis: Secondary | ICD-10-CM | POA: Diagnosis not present

## 2019-11-16 DIAGNOSIS — I442 Atrioventricular block, complete: Secondary | ICD-10-CM | POA: Diagnosis not present

## 2019-11-16 LAB — CUP PACEART REMOTE DEVICE CHECK
Battery Remaining Longevity: 119 mo
Battery Remaining Percentage: 95.5 %
Battery Voltage: 3.01 V
Brady Statistic AP VP Percent: 89 %
Brady Statistic AP VS Percent: 1 %
Brady Statistic AS VP Percent: 8.7 %
Brady Statistic AS VS Percent: 2.1 %
Brady Statistic RA Percent Paced: 35 %
Brady Statistic RV Percent Paced: 99 %
Date Time Interrogation Session: 20210820020024
Implantable Lead Implant Date: 20201113
Implantable Lead Implant Date: 20201113
Implantable Lead Location: 753859
Implantable Lead Location: 753860
Implantable Pulse Generator Implant Date: 20201113
Lead Channel Impedance Value: 460 Ohm
Lead Channel Impedance Value: 480 Ohm
Lead Channel Pacing Threshold Amplitude: 0.625 V
Lead Channel Pacing Threshold Amplitude: 0.875 V
Lead Channel Pacing Threshold Pulse Width: 0.4 ms
Lead Channel Pacing Threshold Pulse Width: 0.4 ms
Lead Channel Sensing Intrinsic Amplitude: 12 mV
Lead Channel Sensing Intrinsic Amplitude: 5 mV
Lead Channel Setting Pacing Amplitude: 1.125
Lead Channel Setting Pacing Amplitude: 1.625
Lead Channel Setting Pacing Pulse Width: 0.4 ms
Lead Channel Setting Sensing Sensitivity: 2 mV
Pulse Gen Model: 2272
Pulse Gen Serial Number: 9178446

## 2019-11-19 DIAGNOSIS — N2581 Secondary hyperparathyroidism of renal origin: Secondary | ICD-10-CM | POA: Diagnosis not present

## 2019-11-19 DIAGNOSIS — N186 End stage renal disease: Secondary | ICD-10-CM | POA: Diagnosis not present

## 2019-11-19 DIAGNOSIS — Z992 Dependence on renal dialysis: Secondary | ICD-10-CM | POA: Diagnosis not present

## 2019-11-19 DIAGNOSIS — D509 Iron deficiency anemia, unspecified: Secondary | ICD-10-CM | POA: Diagnosis not present

## 2019-11-19 NOTE — Progress Notes (Signed)
Remote pacemaker transmission.   

## 2019-11-21 DIAGNOSIS — Z992 Dependence on renal dialysis: Secondary | ICD-10-CM | POA: Diagnosis not present

## 2019-11-21 DIAGNOSIS — N186 End stage renal disease: Secondary | ICD-10-CM | POA: Diagnosis not present

## 2019-11-21 DIAGNOSIS — D509 Iron deficiency anemia, unspecified: Secondary | ICD-10-CM | POA: Diagnosis not present

## 2019-11-21 DIAGNOSIS — N2581 Secondary hyperparathyroidism of renal origin: Secondary | ICD-10-CM | POA: Diagnosis not present

## 2019-11-22 DIAGNOSIS — H401124 Primary open-angle glaucoma, left eye, indeterminate stage: Secondary | ICD-10-CM | POA: Diagnosis not present

## 2019-11-22 DIAGNOSIS — H401113 Primary open-angle glaucoma, right eye, severe stage: Secondary | ICD-10-CM | POA: Diagnosis not present

## 2019-11-23 DIAGNOSIS — D509 Iron deficiency anemia, unspecified: Secondary | ICD-10-CM | POA: Diagnosis not present

## 2019-11-23 DIAGNOSIS — N186 End stage renal disease: Secondary | ICD-10-CM | POA: Diagnosis not present

## 2019-11-23 DIAGNOSIS — Z992 Dependence on renal dialysis: Secondary | ICD-10-CM | POA: Diagnosis not present

## 2019-11-23 DIAGNOSIS — N2581 Secondary hyperparathyroidism of renal origin: Secondary | ICD-10-CM | POA: Diagnosis not present

## 2019-11-26 DIAGNOSIS — Z992 Dependence on renal dialysis: Secondary | ICD-10-CM | POA: Diagnosis not present

## 2019-11-26 DIAGNOSIS — N2581 Secondary hyperparathyroidism of renal origin: Secondary | ICD-10-CM | POA: Diagnosis not present

## 2019-11-26 DIAGNOSIS — N186 End stage renal disease: Secondary | ICD-10-CM | POA: Diagnosis not present

## 2019-11-26 DIAGNOSIS — D509 Iron deficiency anemia, unspecified: Secondary | ICD-10-CM | POA: Diagnosis not present

## 2019-11-28 DIAGNOSIS — N269 Renal sclerosis, unspecified: Secondary | ICD-10-CM | POA: Diagnosis not present

## 2019-11-28 DIAGNOSIS — N2581 Secondary hyperparathyroidism of renal origin: Secondary | ICD-10-CM | POA: Diagnosis not present

## 2019-11-28 DIAGNOSIS — N186 End stage renal disease: Secondary | ICD-10-CM | POA: Diagnosis not present

## 2019-11-28 DIAGNOSIS — Z992 Dependence on renal dialysis: Secondary | ICD-10-CM | POA: Diagnosis not present

## 2019-11-30 DIAGNOSIS — N186 End stage renal disease: Secondary | ICD-10-CM | POA: Diagnosis not present

## 2019-11-30 DIAGNOSIS — N2581 Secondary hyperparathyroidism of renal origin: Secondary | ICD-10-CM | POA: Diagnosis not present

## 2019-11-30 DIAGNOSIS — Z992 Dependence on renal dialysis: Secondary | ICD-10-CM | POA: Diagnosis not present

## 2019-12-03 DIAGNOSIS — Z992 Dependence on renal dialysis: Secondary | ICD-10-CM | POA: Diagnosis not present

## 2019-12-03 DIAGNOSIS — N186 End stage renal disease: Secondary | ICD-10-CM | POA: Diagnosis not present

## 2019-12-03 DIAGNOSIS — N2581 Secondary hyperparathyroidism of renal origin: Secondary | ICD-10-CM | POA: Diagnosis not present

## 2019-12-05 DIAGNOSIS — N2581 Secondary hyperparathyroidism of renal origin: Secondary | ICD-10-CM | POA: Diagnosis not present

## 2019-12-05 DIAGNOSIS — N186 End stage renal disease: Secondary | ICD-10-CM | POA: Diagnosis not present

## 2019-12-05 DIAGNOSIS — Z992 Dependence on renal dialysis: Secondary | ICD-10-CM | POA: Diagnosis not present

## 2019-12-07 DIAGNOSIS — N2581 Secondary hyperparathyroidism of renal origin: Secondary | ICD-10-CM | POA: Diagnosis not present

## 2019-12-07 DIAGNOSIS — N186 End stage renal disease: Secondary | ICD-10-CM | POA: Diagnosis not present

## 2019-12-07 DIAGNOSIS — Z992 Dependence on renal dialysis: Secondary | ICD-10-CM | POA: Diagnosis not present

## 2019-12-10 DIAGNOSIS — Z992 Dependence on renal dialysis: Secondary | ICD-10-CM | POA: Diagnosis not present

## 2019-12-10 DIAGNOSIS — N186 End stage renal disease: Secondary | ICD-10-CM | POA: Diagnosis not present

## 2019-12-10 DIAGNOSIS — N2581 Secondary hyperparathyroidism of renal origin: Secondary | ICD-10-CM | POA: Diagnosis not present

## 2019-12-12 DIAGNOSIS — N186 End stage renal disease: Secondary | ICD-10-CM | POA: Diagnosis not present

## 2019-12-12 DIAGNOSIS — N2581 Secondary hyperparathyroidism of renal origin: Secondary | ICD-10-CM | POA: Diagnosis not present

## 2019-12-12 DIAGNOSIS — Z992 Dependence on renal dialysis: Secondary | ICD-10-CM | POA: Diagnosis not present

## 2019-12-14 DIAGNOSIS — N186 End stage renal disease: Secondary | ICD-10-CM | POA: Diagnosis not present

## 2019-12-14 DIAGNOSIS — Z992 Dependence on renal dialysis: Secondary | ICD-10-CM | POA: Diagnosis not present

## 2019-12-14 DIAGNOSIS — N2581 Secondary hyperparathyroidism of renal origin: Secondary | ICD-10-CM | POA: Diagnosis not present

## 2019-12-17 DIAGNOSIS — N186 End stage renal disease: Secondary | ICD-10-CM | POA: Diagnosis not present

## 2019-12-17 DIAGNOSIS — Z992 Dependence on renal dialysis: Secondary | ICD-10-CM | POA: Diagnosis not present

## 2019-12-17 DIAGNOSIS — N2581 Secondary hyperparathyroidism of renal origin: Secondary | ICD-10-CM | POA: Diagnosis not present

## 2019-12-19 DIAGNOSIS — N186 End stage renal disease: Secondary | ICD-10-CM | POA: Diagnosis not present

## 2019-12-19 DIAGNOSIS — Z992 Dependence on renal dialysis: Secondary | ICD-10-CM | POA: Diagnosis not present

## 2019-12-19 DIAGNOSIS — N2581 Secondary hyperparathyroidism of renal origin: Secondary | ICD-10-CM | POA: Diagnosis not present

## 2019-12-20 DIAGNOSIS — H401124 Primary open-angle glaucoma, left eye, indeterminate stage: Secondary | ICD-10-CM | POA: Diagnosis not present

## 2019-12-20 DIAGNOSIS — H401113 Primary open-angle glaucoma, right eye, severe stage: Secondary | ICD-10-CM | POA: Diagnosis not present

## 2019-12-21 DIAGNOSIS — Z992 Dependence on renal dialysis: Secondary | ICD-10-CM | POA: Diagnosis not present

## 2019-12-21 DIAGNOSIS — N186 End stage renal disease: Secondary | ICD-10-CM | POA: Diagnosis not present

## 2019-12-21 DIAGNOSIS — N2581 Secondary hyperparathyroidism of renal origin: Secondary | ICD-10-CM | POA: Diagnosis not present

## 2019-12-24 DIAGNOSIS — Z992 Dependence on renal dialysis: Secondary | ICD-10-CM | POA: Diagnosis not present

## 2019-12-24 DIAGNOSIS — N2581 Secondary hyperparathyroidism of renal origin: Secondary | ICD-10-CM | POA: Diagnosis not present

## 2019-12-24 DIAGNOSIS — N186 End stage renal disease: Secondary | ICD-10-CM | POA: Diagnosis not present

## 2019-12-26 DIAGNOSIS — N2581 Secondary hyperparathyroidism of renal origin: Secondary | ICD-10-CM | POA: Diagnosis not present

## 2019-12-26 DIAGNOSIS — Z992 Dependence on renal dialysis: Secondary | ICD-10-CM | POA: Diagnosis not present

## 2019-12-26 DIAGNOSIS — N186 End stage renal disease: Secondary | ICD-10-CM | POA: Diagnosis not present

## 2019-12-28 DIAGNOSIS — N269 Renal sclerosis, unspecified: Secondary | ICD-10-CM | POA: Diagnosis not present

## 2019-12-28 DIAGNOSIS — Z992 Dependence on renal dialysis: Secondary | ICD-10-CM | POA: Diagnosis not present

## 2019-12-28 DIAGNOSIS — Z23 Encounter for immunization: Secondary | ICD-10-CM | POA: Diagnosis not present

## 2019-12-28 DIAGNOSIS — D631 Anemia in chronic kidney disease: Secondary | ICD-10-CM | POA: Diagnosis not present

## 2019-12-28 DIAGNOSIS — N2581 Secondary hyperparathyroidism of renal origin: Secondary | ICD-10-CM | POA: Diagnosis not present

## 2019-12-28 DIAGNOSIS — N186 End stage renal disease: Secondary | ICD-10-CM | POA: Diagnosis not present

## 2019-12-31 DIAGNOSIS — N186 End stage renal disease: Secondary | ICD-10-CM | POA: Diagnosis not present

## 2019-12-31 DIAGNOSIS — Z23 Encounter for immunization: Secondary | ICD-10-CM | POA: Diagnosis not present

## 2019-12-31 DIAGNOSIS — N2581 Secondary hyperparathyroidism of renal origin: Secondary | ICD-10-CM | POA: Diagnosis not present

## 2019-12-31 DIAGNOSIS — D631 Anemia in chronic kidney disease: Secondary | ICD-10-CM | POA: Diagnosis not present

## 2019-12-31 DIAGNOSIS — Z992 Dependence on renal dialysis: Secondary | ICD-10-CM | POA: Diagnosis not present

## 2020-01-02 DIAGNOSIS — Z23 Encounter for immunization: Secondary | ICD-10-CM | POA: Diagnosis not present

## 2020-01-02 DIAGNOSIS — D631 Anemia in chronic kidney disease: Secondary | ICD-10-CM | POA: Diagnosis not present

## 2020-01-02 DIAGNOSIS — N186 End stage renal disease: Secondary | ICD-10-CM | POA: Diagnosis not present

## 2020-01-02 DIAGNOSIS — N2581 Secondary hyperparathyroidism of renal origin: Secondary | ICD-10-CM | POA: Diagnosis not present

## 2020-01-02 DIAGNOSIS — Z992 Dependence on renal dialysis: Secondary | ICD-10-CM | POA: Diagnosis not present

## 2020-01-04 DIAGNOSIS — Z992 Dependence on renal dialysis: Secondary | ICD-10-CM | POA: Diagnosis not present

## 2020-01-04 DIAGNOSIS — N186 End stage renal disease: Secondary | ICD-10-CM | POA: Diagnosis not present

## 2020-01-04 DIAGNOSIS — N2581 Secondary hyperparathyroidism of renal origin: Secondary | ICD-10-CM | POA: Diagnosis not present

## 2020-01-04 DIAGNOSIS — Z23 Encounter for immunization: Secondary | ICD-10-CM | POA: Diagnosis not present

## 2020-01-04 DIAGNOSIS — D631 Anemia in chronic kidney disease: Secondary | ICD-10-CM | POA: Diagnosis not present

## 2020-01-07 DIAGNOSIS — N186 End stage renal disease: Secondary | ICD-10-CM | POA: Diagnosis not present

## 2020-01-07 DIAGNOSIS — D631 Anemia in chronic kidney disease: Secondary | ICD-10-CM | POA: Diagnosis not present

## 2020-01-07 DIAGNOSIS — N2581 Secondary hyperparathyroidism of renal origin: Secondary | ICD-10-CM | POA: Diagnosis not present

## 2020-01-07 DIAGNOSIS — Z23 Encounter for immunization: Secondary | ICD-10-CM | POA: Diagnosis not present

## 2020-01-07 DIAGNOSIS — Z992 Dependence on renal dialysis: Secondary | ICD-10-CM | POA: Diagnosis not present

## 2020-01-09 DIAGNOSIS — N186 End stage renal disease: Secondary | ICD-10-CM | POA: Diagnosis not present

## 2020-01-09 DIAGNOSIS — Z992 Dependence on renal dialysis: Secondary | ICD-10-CM | POA: Diagnosis not present

## 2020-01-09 DIAGNOSIS — D631 Anemia in chronic kidney disease: Secondary | ICD-10-CM | POA: Diagnosis not present

## 2020-01-09 DIAGNOSIS — N2581 Secondary hyperparathyroidism of renal origin: Secondary | ICD-10-CM | POA: Diagnosis not present

## 2020-01-09 DIAGNOSIS — Z23 Encounter for immunization: Secondary | ICD-10-CM | POA: Diagnosis not present

## 2020-01-11 DIAGNOSIS — Z23 Encounter for immunization: Secondary | ICD-10-CM | POA: Diagnosis not present

## 2020-01-11 DIAGNOSIS — Z992 Dependence on renal dialysis: Secondary | ICD-10-CM | POA: Diagnosis not present

## 2020-01-11 DIAGNOSIS — N186 End stage renal disease: Secondary | ICD-10-CM | POA: Diagnosis not present

## 2020-01-11 DIAGNOSIS — D631 Anemia in chronic kidney disease: Secondary | ICD-10-CM | POA: Diagnosis not present

## 2020-01-11 DIAGNOSIS — N2581 Secondary hyperparathyroidism of renal origin: Secondary | ICD-10-CM | POA: Diagnosis not present

## 2020-01-14 DIAGNOSIS — N186 End stage renal disease: Secondary | ICD-10-CM | POA: Diagnosis not present

## 2020-01-14 DIAGNOSIS — N2581 Secondary hyperparathyroidism of renal origin: Secondary | ICD-10-CM | POA: Diagnosis not present

## 2020-01-14 DIAGNOSIS — Z23 Encounter for immunization: Secondary | ICD-10-CM | POA: Diagnosis not present

## 2020-01-14 DIAGNOSIS — D631 Anemia in chronic kidney disease: Secondary | ICD-10-CM | POA: Diagnosis not present

## 2020-01-14 DIAGNOSIS — Z992 Dependence on renal dialysis: Secondary | ICD-10-CM | POA: Diagnosis not present

## 2020-01-15 ENCOUNTER — Ambulatory Visit (INDEPENDENT_AMBULATORY_CARE_PROVIDER_SITE_OTHER): Payer: Medicare Other | Admitting: Podiatrist

## 2020-01-15 ENCOUNTER — Other Ambulatory Visit: Payer: Self-pay

## 2020-01-15 DIAGNOSIS — M79675 Pain in left toe(s): Secondary | ICD-10-CM | POA: Diagnosis not present

## 2020-01-15 DIAGNOSIS — M79674 Pain in right toe(s): Secondary | ICD-10-CM | POA: Diagnosis not present

## 2020-01-15 DIAGNOSIS — B351 Tinea unguium: Secondary | ICD-10-CM

## 2020-01-15 DIAGNOSIS — N185 Chronic kidney disease, stage 5: Secondary | ICD-10-CM

## 2020-01-15 DIAGNOSIS — N186 End stage renal disease: Secondary | ICD-10-CM

## 2020-01-16 DIAGNOSIS — D631 Anemia in chronic kidney disease: Secondary | ICD-10-CM | POA: Diagnosis not present

## 2020-01-16 DIAGNOSIS — N186 End stage renal disease: Secondary | ICD-10-CM | POA: Diagnosis not present

## 2020-01-16 DIAGNOSIS — Z23 Encounter for immunization: Secondary | ICD-10-CM | POA: Diagnosis not present

## 2020-01-16 DIAGNOSIS — N2581 Secondary hyperparathyroidism of renal origin: Secondary | ICD-10-CM | POA: Diagnosis not present

## 2020-01-16 DIAGNOSIS — Z992 Dependence on renal dialysis: Secondary | ICD-10-CM | POA: Diagnosis not present

## 2020-01-17 ENCOUNTER — Encounter: Payer: Self-pay | Admitting: Podiatrist

## 2020-01-17 NOTE — Progress Notes (Signed)
Chief Complaint  Patient presents with  . Nail Problem    Nail trim 1-5 bilateral     HPI: Patient is 86 y.o. male who presents today for at risk foot care and nail trim to bilateral feet.  Patient has stage III chronic kidney disease and is recommended to have professional foot care performed.  His digital nails are painful with shoe gear and ambulation when they grow too long.  No new problems reported.  Allergies  Allergen Reactions  . Iodinated Diagnostic Agents Other (See Comments)    Right heart cath 07/2016 allegedly "shut down his kidneys" Patient has stage III chronic kidney disease  . Zocor [Simvastatin] Other (See Comments)    Liver problems   . Calcitriol Other (See Comments)  . Budesonide-Formoterol Fumarate Other (See Comments)    Dry mouth  . Minoxidil Other (See Comments)    Unknown raction  . Tiotropium Bromide Monohydrate Other (See Comments)    Dry mouth  . Tricor [Fenofibrate]     Unknown reaction   Past Medical History:  Diagnosis Date  . Anemia    low iron  . BPH (benign prostatic hyperplasia)   . CAD (coronary artery disease)   . Carotid artery stenosis    1-39% bilateral carotid artery stenosis and < 50% stenosis in the right CCA by dopplers 06/2017  . CKD (chronic kidney disease), stage III    Stage 4  . Diastolic dysfunction   . Glaucoma   . Graves disease   . History of ETOH abuse   . Hyperlipemia   . Hypertension   . Hyperthyroidism 08/26/10   radioactive iodine therapy   . Memory loss   . Mitral regurgitation echo 2015   mild  . Multiple thyroid nodules   . MVP (mitral valve prolapse) 11/2012   posterior MVP  . OSA (obstructive sleep apnea)    upper airway resistance syndrome with RDI 18/hr - not on CPAP due to insurance not covering  . Pre-diabetes   . PUD (peptic ulcer disease)   . Pulmonary hypertension ([place]) echo 2015   Group 2 with pulmonary venous HTN and Group 3 with OSA  . Upper airway resistance syndrome       Physical  Exam  Patient is awake, alert, and oriented x 3.  In no acute distress.    Vascular status is intact with faintly palpable pedal pulses DP and PT bilateral and capillary refill time less than 3 seconds bilateral.  No edema or erythema noted.  Neurological exam reveals epicritic and protective sensation grossly intact bilateral.   Dermatological exam reveals skin is supple and dry to bilateral feet.  No open lesions present.  Digital nails are elongated, brittle, friable, discolored with subungual debris present and painful with palpation and debridement x5 bilateral.  Musculoskeletal exam: Musculature intact with dorsiflexion, plantarflexion, inversion, eversion.  No gross bony abnormalities noted.   Assessment:    ICD-10-CM   1. Pain due to onychomycosis of toenails of both feet  B35.1    M79.675    M79.674   2. ESRD (end stage renal disease) ([place])  N18.6      Plan: Debrided the digital nails with sterile nail nipper and power bur without complication.  He will continue to wear soft supportive shoe gear daily.  Patient will return in about 3 months for continued professional debridement and will call sooner if any concerns arise.

## 2020-01-18 DIAGNOSIS — Z992 Dependence on renal dialysis: Secondary | ICD-10-CM | POA: Diagnosis not present

## 2020-01-18 DIAGNOSIS — N2581 Secondary hyperparathyroidism of renal origin: Secondary | ICD-10-CM | POA: Diagnosis not present

## 2020-01-18 DIAGNOSIS — D631 Anemia in chronic kidney disease: Secondary | ICD-10-CM | POA: Diagnosis not present

## 2020-01-18 DIAGNOSIS — Z23 Encounter for immunization: Secondary | ICD-10-CM | POA: Diagnosis not present

## 2020-01-18 DIAGNOSIS — N186 End stage renal disease: Secondary | ICD-10-CM | POA: Diagnosis not present

## 2020-01-21 DIAGNOSIS — Z23 Encounter for immunization: Secondary | ICD-10-CM | POA: Diagnosis not present

## 2020-01-21 DIAGNOSIS — N186 End stage renal disease: Secondary | ICD-10-CM | POA: Diagnosis not present

## 2020-01-21 DIAGNOSIS — N2581 Secondary hyperparathyroidism of renal origin: Secondary | ICD-10-CM | POA: Diagnosis not present

## 2020-01-21 DIAGNOSIS — Z992 Dependence on renal dialysis: Secondary | ICD-10-CM | POA: Diagnosis not present

## 2020-01-21 DIAGNOSIS — D631 Anemia in chronic kidney disease: Secondary | ICD-10-CM | POA: Diagnosis not present

## 2020-01-23 DIAGNOSIS — Z23 Encounter for immunization: Secondary | ICD-10-CM | POA: Diagnosis not present

## 2020-01-23 DIAGNOSIS — Z992 Dependence on renal dialysis: Secondary | ICD-10-CM | POA: Diagnosis not present

## 2020-01-23 DIAGNOSIS — D631 Anemia in chronic kidney disease: Secondary | ICD-10-CM | POA: Diagnosis not present

## 2020-01-23 DIAGNOSIS — N186 End stage renal disease: Secondary | ICD-10-CM | POA: Diagnosis not present

## 2020-01-23 DIAGNOSIS — N2581 Secondary hyperparathyroidism of renal origin: Secondary | ICD-10-CM | POA: Diagnosis not present

## 2020-01-24 DIAGNOSIS — Z1389 Encounter for screening for other disorder: Secondary | ICD-10-CM | POA: Diagnosis not present

## 2020-01-24 DIAGNOSIS — Z0000 Encounter for general adult medical examination without abnormal findings: Secondary | ICD-10-CM | POA: Diagnosis not present

## 2020-01-24 DIAGNOSIS — Z23 Encounter for immunization: Secondary | ICD-10-CM | POA: Diagnosis not present

## 2020-01-25 DIAGNOSIS — Z23 Encounter for immunization: Secondary | ICD-10-CM | POA: Diagnosis not present

## 2020-01-25 DIAGNOSIS — N186 End stage renal disease: Secondary | ICD-10-CM | POA: Diagnosis not present

## 2020-01-25 DIAGNOSIS — Z992 Dependence on renal dialysis: Secondary | ICD-10-CM | POA: Diagnosis not present

## 2020-01-25 DIAGNOSIS — N2581 Secondary hyperparathyroidism of renal origin: Secondary | ICD-10-CM | POA: Diagnosis not present

## 2020-01-25 DIAGNOSIS — D631 Anemia in chronic kidney disease: Secondary | ICD-10-CM | POA: Diagnosis not present

## 2020-01-28 DIAGNOSIS — N269 Renal sclerosis, unspecified: Secondary | ICD-10-CM | POA: Diagnosis not present

## 2020-01-28 DIAGNOSIS — N2581 Secondary hyperparathyroidism of renal origin: Secondary | ICD-10-CM | POA: Diagnosis not present

## 2020-01-28 DIAGNOSIS — Z992 Dependence on renal dialysis: Secondary | ICD-10-CM | POA: Diagnosis not present

## 2020-01-28 DIAGNOSIS — N186 End stage renal disease: Secondary | ICD-10-CM | POA: Diagnosis not present

## 2020-01-28 DIAGNOSIS — D631 Anemia in chronic kidney disease: Secondary | ICD-10-CM | POA: Diagnosis not present

## 2020-01-28 DIAGNOSIS — Z23 Encounter for immunization: Secondary | ICD-10-CM | POA: Diagnosis not present

## 2020-01-30 DIAGNOSIS — Z992 Dependence on renal dialysis: Secondary | ICD-10-CM | POA: Diagnosis not present

## 2020-01-30 DIAGNOSIS — Z23 Encounter for immunization: Secondary | ICD-10-CM | POA: Diagnosis not present

## 2020-01-30 DIAGNOSIS — N2581 Secondary hyperparathyroidism of renal origin: Secondary | ICD-10-CM | POA: Diagnosis not present

## 2020-01-30 DIAGNOSIS — N186 End stage renal disease: Secondary | ICD-10-CM | POA: Diagnosis not present

## 2020-01-30 DIAGNOSIS — D631 Anemia in chronic kidney disease: Secondary | ICD-10-CM | POA: Diagnosis not present

## 2020-02-01 DIAGNOSIS — N2581 Secondary hyperparathyroidism of renal origin: Secondary | ICD-10-CM | POA: Diagnosis not present

## 2020-02-01 DIAGNOSIS — N186 End stage renal disease: Secondary | ICD-10-CM | POA: Diagnosis not present

## 2020-02-01 DIAGNOSIS — Z23 Encounter for immunization: Secondary | ICD-10-CM | POA: Diagnosis not present

## 2020-02-01 DIAGNOSIS — D631 Anemia in chronic kidney disease: Secondary | ICD-10-CM | POA: Diagnosis not present

## 2020-02-01 DIAGNOSIS — Z992 Dependence on renal dialysis: Secondary | ICD-10-CM | POA: Diagnosis not present

## 2020-02-04 DIAGNOSIS — Z992 Dependence on renal dialysis: Secondary | ICD-10-CM | POA: Diagnosis not present

## 2020-02-04 DIAGNOSIS — N2581 Secondary hyperparathyroidism of renal origin: Secondary | ICD-10-CM | POA: Diagnosis not present

## 2020-02-04 DIAGNOSIS — D631 Anemia in chronic kidney disease: Secondary | ICD-10-CM | POA: Diagnosis not present

## 2020-02-04 DIAGNOSIS — N186 End stage renal disease: Secondary | ICD-10-CM | POA: Diagnosis not present

## 2020-02-04 DIAGNOSIS — Z23 Encounter for immunization: Secondary | ICD-10-CM | POA: Diagnosis not present

## 2020-02-06 DIAGNOSIS — D631 Anemia in chronic kidney disease: Secondary | ICD-10-CM | POA: Diagnosis not present

## 2020-02-06 DIAGNOSIS — N2581 Secondary hyperparathyroidism of renal origin: Secondary | ICD-10-CM | POA: Diagnosis not present

## 2020-02-06 DIAGNOSIS — Z23 Encounter for immunization: Secondary | ICD-10-CM | POA: Diagnosis not present

## 2020-02-06 DIAGNOSIS — Z992 Dependence on renal dialysis: Secondary | ICD-10-CM | POA: Diagnosis not present

## 2020-02-06 DIAGNOSIS — N186 End stage renal disease: Secondary | ICD-10-CM | POA: Diagnosis not present

## 2020-02-08 DIAGNOSIS — N2581 Secondary hyperparathyroidism of renal origin: Secondary | ICD-10-CM | POA: Diagnosis not present

## 2020-02-08 DIAGNOSIS — N186 End stage renal disease: Secondary | ICD-10-CM | POA: Diagnosis not present

## 2020-02-08 DIAGNOSIS — Z23 Encounter for immunization: Secondary | ICD-10-CM | POA: Diagnosis not present

## 2020-02-08 DIAGNOSIS — Z992 Dependence on renal dialysis: Secondary | ICD-10-CM | POA: Diagnosis not present

## 2020-02-08 DIAGNOSIS — D631 Anemia in chronic kidney disease: Secondary | ICD-10-CM | POA: Diagnosis not present

## 2020-02-11 DIAGNOSIS — D631 Anemia in chronic kidney disease: Secondary | ICD-10-CM | POA: Diagnosis not present

## 2020-02-11 DIAGNOSIS — N2581 Secondary hyperparathyroidism of renal origin: Secondary | ICD-10-CM | POA: Diagnosis not present

## 2020-02-11 DIAGNOSIS — N186 End stage renal disease: Secondary | ICD-10-CM | POA: Diagnosis not present

## 2020-02-11 DIAGNOSIS — Z23 Encounter for immunization: Secondary | ICD-10-CM | POA: Diagnosis not present

## 2020-02-11 DIAGNOSIS — Z992 Dependence on renal dialysis: Secondary | ICD-10-CM | POA: Diagnosis not present

## 2020-02-13 DIAGNOSIS — D631 Anemia in chronic kidney disease: Secondary | ICD-10-CM | POA: Diagnosis not present

## 2020-02-13 DIAGNOSIS — Z992 Dependence on renal dialysis: Secondary | ICD-10-CM | POA: Diagnosis not present

## 2020-02-13 DIAGNOSIS — N2581 Secondary hyperparathyroidism of renal origin: Secondary | ICD-10-CM | POA: Diagnosis not present

## 2020-02-13 DIAGNOSIS — N186 End stage renal disease: Secondary | ICD-10-CM | POA: Diagnosis not present

## 2020-02-13 DIAGNOSIS — Z23 Encounter for immunization: Secondary | ICD-10-CM | POA: Diagnosis not present

## 2020-02-15 DIAGNOSIS — Z992 Dependence on renal dialysis: Secondary | ICD-10-CM | POA: Diagnosis not present

## 2020-02-15 DIAGNOSIS — N186 End stage renal disease: Secondary | ICD-10-CM | POA: Diagnosis not present

## 2020-02-15 DIAGNOSIS — D631 Anemia in chronic kidney disease: Secondary | ICD-10-CM | POA: Diagnosis not present

## 2020-02-15 DIAGNOSIS — N2581 Secondary hyperparathyroidism of renal origin: Secondary | ICD-10-CM | POA: Diagnosis not present

## 2020-02-15 DIAGNOSIS — Z23 Encounter for immunization: Secondary | ICD-10-CM | POA: Diagnosis not present

## 2020-02-17 DIAGNOSIS — N186 End stage renal disease: Secondary | ICD-10-CM | POA: Diagnosis not present

## 2020-02-17 DIAGNOSIS — Z23 Encounter for immunization: Secondary | ICD-10-CM | POA: Diagnosis not present

## 2020-02-17 DIAGNOSIS — D631 Anemia in chronic kidney disease: Secondary | ICD-10-CM | POA: Diagnosis not present

## 2020-02-17 DIAGNOSIS — Z992 Dependence on renal dialysis: Secondary | ICD-10-CM | POA: Diagnosis not present

## 2020-02-17 DIAGNOSIS — N2581 Secondary hyperparathyroidism of renal origin: Secondary | ICD-10-CM | POA: Diagnosis not present

## 2020-02-19 DIAGNOSIS — N2581 Secondary hyperparathyroidism of renal origin: Secondary | ICD-10-CM | POA: Diagnosis not present

## 2020-02-19 DIAGNOSIS — N186 End stage renal disease: Secondary | ICD-10-CM | POA: Diagnosis not present

## 2020-02-19 DIAGNOSIS — D631 Anemia in chronic kidney disease: Secondary | ICD-10-CM | POA: Diagnosis not present

## 2020-02-19 DIAGNOSIS — Z23 Encounter for immunization: Secondary | ICD-10-CM | POA: Diagnosis not present

## 2020-02-19 DIAGNOSIS — Z992 Dependence on renal dialysis: Secondary | ICD-10-CM | POA: Diagnosis not present

## 2020-02-22 DIAGNOSIS — N186 End stage renal disease: Secondary | ICD-10-CM | POA: Diagnosis not present

## 2020-02-22 DIAGNOSIS — Z23 Encounter for immunization: Secondary | ICD-10-CM | POA: Diagnosis not present

## 2020-02-22 DIAGNOSIS — Z992 Dependence on renal dialysis: Secondary | ICD-10-CM | POA: Diagnosis not present

## 2020-02-22 DIAGNOSIS — D631 Anemia in chronic kidney disease: Secondary | ICD-10-CM | POA: Diagnosis not present

## 2020-02-22 DIAGNOSIS — N2581 Secondary hyperparathyroidism of renal origin: Secondary | ICD-10-CM | POA: Diagnosis not present

## 2020-02-25 DIAGNOSIS — N2581 Secondary hyperparathyroidism of renal origin: Secondary | ICD-10-CM | POA: Diagnosis not present

## 2020-02-25 DIAGNOSIS — Z992 Dependence on renal dialysis: Secondary | ICD-10-CM | POA: Diagnosis not present

## 2020-02-25 DIAGNOSIS — Z23 Encounter for immunization: Secondary | ICD-10-CM | POA: Diagnosis not present

## 2020-02-25 DIAGNOSIS — N186 End stage renal disease: Secondary | ICD-10-CM | POA: Diagnosis not present

## 2020-02-25 DIAGNOSIS — D631 Anemia in chronic kidney disease: Secondary | ICD-10-CM | POA: Diagnosis not present

## 2020-02-27 DIAGNOSIS — N269 Renal sclerosis, unspecified: Secondary | ICD-10-CM | POA: Diagnosis not present

## 2020-02-27 DIAGNOSIS — D631 Anemia in chronic kidney disease: Secondary | ICD-10-CM | POA: Diagnosis not present

## 2020-02-27 DIAGNOSIS — D509 Iron deficiency anemia, unspecified: Secondary | ICD-10-CM | POA: Diagnosis not present

## 2020-02-27 DIAGNOSIS — N2581 Secondary hyperparathyroidism of renal origin: Secondary | ICD-10-CM | POA: Diagnosis not present

## 2020-02-27 DIAGNOSIS — N186 End stage renal disease: Secondary | ICD-10-CM | POA: Diagnosis not present

## 2020-02-27 DIAGNOSIS — Z992 Dependence on renal dialysis: Secondary | ICD-10-CM | POA: Diagnosis not present

## 2020-02-29 DIAGNOSIS — N2581 Secondary hyperparathyroidism of renal origin: Secondary | ICD-10-CM | POA: Diagnosis not present

## 2020-02-29 DIAGNOSIS — Z992 Dependence on renal dialysis: Secondary | ICD-10-CM | POA: Diagnosis not present

## 2020-02-29 DIAGNOSIS — D509 Iron deficiency anemia, unspecified: Secondary | ICD-10-CM | POA: Diagnosis not present

## 2020-02-29 DIAGNOSIS — N186 End stage renal disease: Secondary | ICD-10-CM | POA: Diagnosis not present

## 2020-02-29 DIAGNOSIS — D631 Anemia in chronic kidney disease: Secondary | ICD-10-CM | POA: Diagnosis not present

## 2020-03-03 DIAGNOSIS — Z992 Dependence on renal dialysis: Secondary | ICD-10-CM | POA: Diagnosis not present

## 2020-03-03 DIAGNOSIS — D631 Anemia in chronic kidney disease: Secondary | ICD-10-CM | POA: Diagnosis not present

## 2020-03-03 DIAGNOSIS — N186 End stage renal disease: Secondary | ICD-10-CM | POA: Diagnosis not present

## 2020-03-03 DIAGNOSIS — D509 Iron deficiency anemia, unspecified: Secondary | ICD-10-CM | POA: Diagnosis not present

## 2020-03-03 DIAGNOSIS — N2581 Secondary hyperparathyroidism of renal origin: Secondary | ICD-10-CM | POA: Diagnosis not present

## 2020-03-05 DIAGNOSIS — Z992 Dependence on renal dialysis: Secondary | ICD-10-CM | POA: Diagnosis not present

## 2020-03-05 DIAGNOSIS — D509 Iron deficiency anemia, unspecified: Secondary | ICD-10-CM | POA: Diagnosis not present

## 2020-03-05 DIAGNOSIS — N186 End stage renal disease: Secondary | ICD-10-CM | POA: Diagnosis not present

## 2020-03-05 DIAGNOSIS — D631 Anemia in chronic kidney disease: Secondary | ICD-10-CM | POA: Diagnosis not present

## 2020-03-05 DIAGNOSIS — N2581 Secondary hyperparathyroidism of renal origin: Secondary | ICD-10-CM | POA: Diagnosis not present

## 2020-03-07 DIAGNOSIS — N186 End stage renal disease: Secondary | ICD-10-CM | POA: Diagnosis not present

## 2020-03-07 DIAGNOSIS — Z992 Dependence on renal dialysis: Secondary | ICD-10-CM | POA: Diagnosis not present

## 2020-03-07 DIAGNOSIS — N2581 Secondary hyperparathyroidism of renal origin: Secondary | ICD-10-CM | POA: Diagnosis not present

## 2020-03-07 DIAGNOSIS — D631 Anemia in chronic kidney disease: Secondary | ICD-10-CM | POA: Diagnosis not present

## 2020-03-07 DIAGNOSIS — D509 Iron deficiency anemia, unspecified: Secondary | ICD-10-CM | POA: Diagnosis not present

## 2020-03-10 DIAGNOSIS — N2581 Secondary hyperparathyroidism of renal origin: Secondary | ICD-10-CM | POA: Diagnosis not present

## 2020-03-10 DIAGNOSIS — Z992 Dependence on renal dialysis: Secondary | ICD-10-CM | POA: Diagnosis not present

## 2020-03-10 DIAGNOSIS — D509 Iron deficiency anemia, unspecified: Secondary | ICD-10-CM | POA: Diagnosis not present

## 2020-03-10 DIAGNOSIS — N186 End stage renal disease: Secondary | ICD-10-CM | POA: Diagnosis not present

## 2020-03-10 DIAGNOSIS — D631 Anemia in chronic kidney disease: Secondary | ICD-10-CM | POA: Diagnosis not present

## 2020-03-11 DIAGNOSIS — H401124 Primary open-angle glaucoma, left eye, indeterminate stage: Secondary | ICD-10-CM | POA: Diagnosis not present

## 2020-03-11 DIAGNOSIS — H401113 Primary open-angle glaucoma, right eye, severe stage: Secondary | ICD-10-CM | POA: Diagnosis not present

## 2020-03-12 DIAGNOSIS — D509 Iron deficiency anemia, unspecified: Secondary | ICD-10-CM | POA: Diagnosis not present

## 2020-03-12 DIAGNOSIS — N186 End stage renal disease: Secondary | ICD-10-CM | POA: Diagnosis not present

## 2020-03-12 DIAGNOSIS — D631 Anemia in chronic kidney disease: Secondary | ICD-10-CM | POA: Diagnosis not present

## 2020-03-12 DIAGNOSIS — Z992 Dependence on renal dialysis: Secondary | ICD-10-CM | POA: Diagnosis not present

## 2020-03-12 DIAGNOSIS — N2581 Secondary hyperparathyroidism of renal origin: Secondary | ICD-10-CM | POA: Diagnosis not present

## 2020-03-14 DIAGNOSIS — N2581 Secondary hyperparathyroidism of renal origin: Secondary | ICD-10-CM | POA: Diagnosis not present

## 2020-03-14 DIAGNOSIS — Z992 Dependence on renal dialysis: Secondary | ICD-10-CM | POA: Diagnosis not present

## 2020-03-14 DIAGNOSIS — D509 Iron deficiency anemia, unspecified: Secondary | ICD-10-CM | POA: Diagnosis not present

## 2020-03-14 DIAGNOSIS — N186 End stage renal disease: Secondary | ICD-10-CM | POA: Diagnosis not present

## 2020-03-14 DIAGNOSIS — D631 Anemia in chronic kidney disease: Secondary | ICD-10-CM | POA: Diagnosis not present

## 2020-03-17 DIAGNOSIS — D631 Anemia in chronic kidney disease: Secondary | ICD-10-CM | POA: Diagnosis not present

## 2020-03-17 DIAGNOSIS — N2581 Secondary hyperparathyroidism of renal origin: Secondary | ICD-10-CM | POA: Diagnosis not present

## 2020-03-17 DIAGNOSIS — N186 End stage renal disease: Secondary | ICD-10-CM | POA: Diagnosis not present

## 2020-03-17 DIAGNOSIS — Z992 Dependence on renal dialysis: Secondary | ICD-10-CM | POA: Diagnosis not present

## 2020-03-17 DIAGNOSIS — D509 Iron deficiency anemia, unspecified: Secondary | ICD-10-CM | POA: Diagnosis not present

## 2020-03-19 DIAGNOSIS — D509 Iron deficiency anemia, unspecified: Secondary | ICD-10-CM | POA: Diagnosis not present

## 2020-03-19 DIAGNOSIS — N186 End stage renal disease: Secondary | ICD-10-CM | POA: Diagnosis not present

## 2020-03-19 DIAGNOSIS — Z992 Dependence on renal dialysis: Secondary | ICD-10-CM | POA: Diagnosis not present

## 2020-03-19 DIAGNOSIS — D631 Anemia in chronic kidney disease: Secondary | ICD-10-CM | POA: Diagnosis not present

## 2020-03-19 DIAGNOSIS — N2581 Secondary hyperparathyroidism of renal origin: Secondary | ICD-10-CM | POA: Diagnosis not present

## 2020-03-21 DIAGNOSIS — N186 End stage renal disease: Secondary | ICD-10-CM | POA: Diagnosis not present

## 2020-03-21 DIAGNOSIS — Z992 Dependence on renal dialysis: Secondary | ICD-10-CM | POA: Diagnosis not present

## 2020-03-21 DIAGNOSIS — N2581 Secondary hyperparathyroidism of renal origin: Secondary | ICD-10-CM | POA: Diagnosis not present

## 2020-03-21 DIAGNOSIS — D509 Iron deficiency anemia, unspecified: Secondary | ICD-10-CM | POA: Diagnosis not present

## 2020-03-21 DIAGNOSIS — D631 Anemia in chronic kidney disease: Secondary | ICD-10-CM | POA: Diagnosis not present

## 2020-03-24 DIAGNOSIS — D631 Anemia in chronic kidney disease: Secondary | ICD-10-CM | POA: Diagnosis not present

## 2020-03-24 DIAGNOSIS — N186 End stage renal disease: Secondary | ICD-10-CM | POA: Diagnosis not present

## 2020-03-24 DIAGNOSIS — N2581 Secondary hyperparathyroidism of renal origin: Secondary | ICD-10-CM | POA: Diagnosis not present

## 2020-03-24 DIAGNOSIS — D509 Iron deficiency anemia, unspecified: Secondary | ICD-10-CM | POA: Diagnosis not present

## 2020-03-24 DIAGNOSIS — Z992 Dependence on renal dialysis: Secondary | ICD-10-CM | POA: Diagnosis not present

## 2020-03-26 DIAGNOSIS — D631 Anemia in chronic kidney disease: Secondary | ICD-10-CM | POA: Diagnosis not present

## 2020-03-26 DIAGNOSIS — N186 End stage renal disease: Secondary | ICD-10-CM | POA: Diagnosis not present

## 2020-03-26 DIAGNOSIS — D509 Iron deficiency anemia, unspecified: Secondary | ICD-10-CM | POA: Diagnosis not present

## 2020-03-26 DIAGNOSIS — Z992 Dependence on renal dialysis: Secondary | ICD-10-CM | POA: Diagnosis not present

## 2020-03-26 DIAGNOSIS — N2581 Secondary hyperparathyroidism of renal origin: Secondary | ICD-10-CM | POA: Diagnosis not present

## 2020-03-28 DIAGNOSIS — D509 Iron deficiency anemia, unspecified: Secondary | ICD-10-CM | POA: Diagnosis not present

## 2020-03-28 DIAGNOSIS — Z992 Dependence on renal dialysis: Secondary | ICD-10-CM | POA: Diagnosis not present

## 2020-03-28 DIAGNOSIS — N186 End stage renal disease: Secondary | ICD-10-CM | POA: Diagnosis not present

## 2020-03-28 DIAGNOSIS — N2581 Secondary hyperparathyroidism of renal origin: Secondary | ICD-10-CM | POA: Diagnosis not present

## 2020-03-28 DIAGNOSIS — D631 Anemia in chronic kidney disease: Secondary | ICD-10-CM | POA: Diagnosis not present

## 2020-03-29 DIAGNOSIS — N269 Renal sclerosis, unspecified: Secondary | ICD-10-CM | POA: Diagnosis not present

## 2020-03-29 DIAGNOSIS — N186 End stage renal disease: Secondary | ICD-10-CM | POA: Diagnosis not present

## 2020-03-29 DIAGNOSIS — Z992 Dependence on renal dialysis: Secondary | ICD-10-CM | POA: Diagnosis not present

## 2020-03-31 DIAGNOSIS — N186 End stage renal disease: Secondary | ICD-10-CM | POA: Diagnosis not present

## 2020-03-31 DIAGNOSIS — N2581 Secondary hyperparathyroidism of renal origin: Secondary | ICD-10-CM | POA: Diagnosis not present

## 2020-03-31 DIAGNOSIS — Z992 Dependence on renal dialysis: Secondary | ICD-10-CM | POA: Diagnosis not present

## 2020-04-02 DIAGNOSIS — N2581 Secondary hyperparathyroidism of renal origin: Secondary | ICD-10-CM | POA: Diagnosis not present

## 2020-04-02 DIAGNOSIS — N186 End stage renal disease: Secondary | ICD-10-CM | POA: Diagnosis not present

## 2020-04-02 DIAGNOSIS — Z992 Dependence on renal dialysis: Secondary | ICD-10-CM | POA: Diagnosis not present

## 2020-04-04 DIAGNOSIS — N186 End stage renal disease: Secondary | ICD-10-CM | POA: Diagnosis not present

## 2020-04-04 DIAGNOSIS — Z992 Dependence on renal dialysis: Secondary | ICD-10-CM | POA: Diagnosis not present

## 2020-04-04 DIAGNOSIS — N2581 Secondary hyperparathyroidism of renal origin: Secondary | ICD-10-CM | POA: Diagnosis not present

## 2020-04-07 DIAGNOSIS — N2581 Secondary hyperparathyroidism of renal origin: Secondary | ICD-10-CM | POA: Diagnosis not present

## 2020-04-07 DIAGNOSIS — Z992 Dependence on renal dialysis: Secondary | ICD-10-CM | POA: Diagnosis not present

## 2020-04-07 DIAGNOSIS — N186 End stage renal disease: Secondary | ICD-10-CM | POA: Diagnosis not present

## 2020-04-09 DIAGNOSIS — N2581 Secondary hyperparathyroidism of renal origin: Secondary | ICD-10-CM | POA: Diagnosis not present

## 2020-04-09 DIAGNOSIS — N186 End stage renal disease: Secondary | ICD-10-CM | POA: Diagnosis not present

## 2020-04-09 DIAGNOSIS — Z992 Dependence on renal dialysis: Secondary | ICD-10-CM | POA: Diagnosis not present

## 2020-04-11 DIAGNOSIS — Z992 Dependence on renal dialysis: Secondary | ICD-10-CM | POA: Diagnosis not present

## 2020-04-11 DIAGNOSIS — N186 End stage renal disease: Secondary | ICD-10-CM | POA: Diagnosis not present

## 2020-04-11 DIAGNOSIS — N2581 Secondary hyperparathyroidism of renal origin: Secondary | ICD-10-CM | POA: Diagnosis not present

## 2020-04-14 DIAGNOSIS — N186 End stage renal disease: Secondary | ICD-10-CM | POA: Diagnosis not present

## 2020-04-14 DIAGNOSIS — Z992 Dependence on renal dialysis: Secondary | ICD-10-CM | POA: Diagnosis not present

## 2020-04-14 DIAGNOSIS — N2581 Secondary hyperparathyroidism of renal origin: Secondary | ICD-10-CM | POA: Diagnosis not present

## 2020-04-16 DIAGNOSIS — N2581 Secondary hyperparathyroidism of renal origin: Secondary | ICD-10-CM | POA: Diagnosis not present

## 2020-04-16 DIAGNOSIS — N186 End stage renal disease: Secondary | ICD-10-CM | POA: Diagnosis not present

## 2020-04-16 DIAGNOSIS — Z992 Dependence on renal dialysis: Secondary | ICD-10-CM | POA: Diagnosis not present

## 2020-04-18 DIAGNOSIS — N186 End stage renal disease: Secondary | ICD-10-CM | POA: Diagnosis not present

## 2020-04-18 DIAGNOSIS — N2581 Secondary hyperparathyroidism of renal origin: Secondary | ICD-10-CM | POA: Diagnosis not present

## 2020-04-18 DIAGNOSIS — Z992 Dependence on renal dialysis: Secondary | ICD-10-CM | POA: Diagnosis not present

## 2020-04-21 DIAGNOSIS — Z992 Dependence on renal dialysis: Secondary | ICD-10-CM | POA: Diagnosis not present

## 2020-04-21 DIAGNOSIS — N186 End stage renal disease: Secondary | ICD-10-CM | POA: Diagnosis not present

## 2020-04-21 DIAGNOSIS — N2581 Secondary hyperparathyroidism of renal origin: Secondary | ICD-10-CM | POA: Diagnosis not present

## 2020-04-22 ENCOUNTER — Ambulatory Visit: Payer: Medicare Other | Admitting: Podiatry

## 2020-04-23 DIAGNOSIS — N2581 Secondary hyperparathyroidism of renal origin: Secondary | ICD-10-CM | POA: Diagnosis not present

## 2020-04-23 DIAGNOSIS — N186 End stage renal disease: Secondary | ICD-10-CM | POA: Diagnosis not present

## 2020-04-23 DIAGNOSIS — Z992 Dependence on renal dialysis: Secondary | ICD-10-CM | POA: Diagnosis not present

## 2020-04-24 DIAGNOSIS — H401113 Primary open-angle glaucoma, right eye, severe stage: Secondary | ICD-10-CM | POA: Diagnosis not present

## 2020-04-24 DIAGNOSIS — H401124 Primary open-angle glaucoma, left eye, indeterminate stage: Secondary | ICD-10-CM | POA: Diagnosis not present

## 2020-04-25 DIAGNOSIS — N2581 Secondary hyperparathyroidism of renal origin: Secondary | ICD-10-CM | POA: Diagnosis not present

## 2020-04-25 DIAGNOSIS — N186 End stage renal disease: Secondary | ICD-10-CM | POA: Diagnosis not present

## 2020-04-25 DIAGNOSIS — Z992 Dependence on renal dialysis: Secondary | ICD-10-CM | POA: Diagnosis not present

## 2020-04-28 DIAGNOSIS — Z992 Dependence on renal dialysis: Secondary | ICD-10-CM | POA: Diagnosis not present

## 2020-04-28 DIAGNOSIS — N186 End stage renal disease: Secondary | ICD-10-CM | POA: Diagnosis not present

## 2020-04-28 DIAGNOSIS — N2581 Secondary hyperparathyroidism of renal origin: Secondary | ICD-10-CM | POA: Diagnosis not present

## 2020-04-29 DIAGNOSIS — N269 Renal sclerosis, unspecified: Secondary | ICD-10-CM | POA: Diagnosis not present

## 2020-04-29 DIAGNOSIS — N186 End stage renal disease: Secondary | ICD-10-CM | POA: Diagnosis not present

## 2020-04-29 DIAGNOSIS — Z992 Dependence on renal dialysis: Secondary | ICD-10-CM | POA: Diagnosis not present

## 2020-04-30 DIAGNOSIS — N186 End stage renal disease: Secondary | ICD-10-CM | POA: Diagnosis not present

## 2020-04-30 DIAGNOSIS — Z992 Dependence on renal dialysis: Secondary | ICD-10-CM | POA: Diagnosis not present

## 2020-04-30 DIAGNOSIS — D509 Iron deficiency anemia, unspecified: Secondary | ICD-10-CM | POA: Diagnosis not present

## 2020-04-30 DIAGNOSIS — N2581 Secondary hyperparathyroidism of renal origin: Secondary | ICD-10-CM | POA: Diagnosis not present

## 2020-04-30 DIAGNOSIS — D631 Anemia in chronic kidney disease: Secondary | ICD-10-CM | POA: Diagnosis not present

## 2020-05-02 DIAGNOSIS — D631 Anemia in chronic kidney disease: Secondary | ICD-10-CM | POA: Diagnosis not present

## 2020-05-02 DIAGNOSIS — N186 End stage renal disease: Secondary | ICD-10-CM | POA: Diagnosis not present

## 2020-05-02 DIAGNOSIS — N2581 Secondary hyperparathyroidism of renal origin: Secondary | ICD-10-CM | POA: Diagnosis not present

## 2020-05-02 DIAGNOSIS — Z992 Dependence on renal dialysis: Secondary | ICD-10-CM | POA: Diagnosis not present

## 2020-05-02 DIAGNOSIS — D509 Iron deficiency anemia, unspecified: Secondary | ICD-10-CM | POA: Diagnosis not present

## 2020-05-05 DIAGNOSIS — N186 End stage renal disease: Secondary | ICD-10-CM | POA: Diagnosis not present

## 2020-05-05 DIAGNOSIS — Z992 Dependence on renal dialysis: Secondary | ICD-10-CM | POA: Diagnosis not present

## 2020-05-05 DIAGNOSIS — D509 Iron deficiency anemia, unspecified: Secondary | ICD-10-CM | POA: Diagnosis not present

## 2020-05-05 DIAGNOSIS — N2581 Secondary hyperparathyroidism of renal origin: Secondary | ICD-10-CM | POA: Diagnosis not present

## 2020-05-05 DIAGNOSIS — D631 Anemia in chronic kidney disease: Secondary | ICD-10-CM | POA: Diagnosis not present

## 2020-05-07 ENCOUNTER — Other Ambulatory Visit: Payer: Self-pay | Admitting: Cardiology

## 2020-05-07 DIAGNOSIS — Z992 Dependence on renal dialysis: Secondary | ICD-10-CM | POA: Diagnosis not present

## 2020-05-07 DIAGNOSIS — N2581 Secondary hyperparathyroidism of renal origin: Secondary | ICD-10-CM | POA: Diagnosis not present

## 2020-05-07 DIAGNOSIS — D509 Iron deficiency anemia, unspecified: Secondary | ICD-10-CM | POA: Diagnosis not present

## 2020-05-07 DIAGNOSIS — D631 Anemia in chronic kidney disease: Secondary | ICD-10-CM | POA: Diagnosis not present

## 2020-05-07 DIAGNOSIS — N186 End stage renal disease: Secondary | ICD-10-CM | POA: Diagnosis not present

## 2020-05-09 DIAGNOSIS — Z992 Dependence on renal dialysis: Secondary | ICD-10-CM | POA: Diagnosis not present

## 2020-05-09 DIAGNOSIS — N2581 Secondary hyperparathyroidism of renal origin: Secondary | ICD-10-CM | POA: Diagnosis not present

## 2020-05-09 DIAGNOSIS — D631 Anemia in chronic kidney disease: Secondary | ICD-10-CM | POA: Diagnosis not present

## 2020-05-09 DIAGNOSIS — N186 End stage renal disease: Secondary | ICD-10-CM | POA: Diagnosis not present

## 2020-05-09 DIAGNOSIS — D509 Iron deficiency anemia, unspecified: Secondary | ICD-10-CM | POA: Diagnosis not present

## 2020-05-12 DIAGNOSIS — D509 Iron deficiency anemia, unspecified: Secondary | ICD-10-CM | POA: Diagnosis not present

## 2020-05-12 DIAGNOSIS — N2581 Secondary hyperparathyroidism of renal origin: Secondary | ICD-10-CM | POA: Diagnosis not present

## 2020-05-12 DIAGNOSIS — Z992 Dependence on renal dialysis: Secondary | ICD-10-CM | POA: Diagnosis not present

## 2020-05-12 DIAGNOSIS — D631 Anemia in chronic kidney disease: Secondary | ICD-10-CM | POA: Diagnosis not present

## 2020-05-12 DIAGNOSIS — N186 End stage renal disease: Secondary | ICD-10-CM | POA: Diagnosis not present

## 2020-05-14 DIAGNOSIS — N2581 Secondary hyperparathyroidism of renal origin: Secondary | ICD-10-CM | POA: Diagnosis not present

## 2020-05-14 DIAGNOSIS — N186 End stage renal disease: Secondary | ICD-10-CM | POA: Diagnosis not present

## 2020-05-14 DIAGNOSIS — Z992 Dependence on renal dialysis: Secondary | ICD-10-CM | POA: Diagnosis not present

## 2020-05-14 DIAGNOSIS — D631 Anemia in chronic kidney disease: Secondary | ICD-10-CM | POA: Diagnosis not present

## 2020-05-14 DIAGNOSIS — D509 Iron deficiency anemia, unspecified: Secondary | ICD-10-CM | POA: Diagnosis not present

## 2020-05-16 ENCOUNTER — Ambulatory Visit (INDEPENDENT_AMBULATORY_CARE_PROVIDER_SITE_OTHER): Payer: Medicare Other

## 2020-05-16 DIAGNOSIS — D631 Anemia in chronic kidney disease: Secondary | ICD-10-CM | POA: Diagnosis not present

## 2020-05-16 DIAGNOSIS — D509 Iron deficiency anemia, unspecified: Secondary | ICD-10-CM | POA: Diagnosis not present

## 2020-05-16 DIAGNOSIS — Z992 Dependence on renal dialysis: Secondary | ICD-10-CM | POA: Diagnosis not present

## 2020-05-16 DIAGNOSIS — I442 Atrioventricular block, complete: Secondary | ICD-10-CM

## 2020-05-16 DIAGNOSIS — N2581 Secondary hyperparathyroidism of renal origin: Secondary | ICD-10-CM | POA: Diagnosis not present

## 2020-05-16 DIAGNOSIS — N186 End stage renal disease: Secondary | ICD-10-CM | POA: Diagnosis not present

## 2020-05-16 LAB — CUP PACEART REMOTE DEVICE CHECK
Battery Remaining Longevity: 128 mo
Battery Remaining Percentage: 95.5 %
Battery Voltage: 3.01 V
Brady Statistic AP VP Percent: 89 %
Brady Statistic AP VS Percent: 1 %
Brady Statistic AS VP Percent: 8.7 %
Brady Statistic AS VS Percent: 2.1 %
Brady Statistic RA Percent Paced: 17 %
Brady Statistic RV Percent Paced: 99 %
Date Time Interrogation Session: 20220218024200
Implantable Lead Implant Date: 20201113
Implantable Lead Implant Date: 20201113
Implantable Lead Location: 753859
Implantable Lead Location: 753860
Implantable Pulse Generator Implant Date: 20201113
Lead Channel Impedance Value: 450 Ohm
Lead Channel Impedance Value: 450 Ohm
Lead Channel Pacing Threshold Amplitude: 0.625 V
Lead Channel Pacing Threshold Amplitude: 0.75 V
Lead Channel Pacing Threshold Pulse Width: 0.4 ms
Lead Channel Pacing Threshold Pulse Width: 0.4 ms
Lead Channel Sensing Intrinsic Amplitude: 5 mV
Lead Channel Sensing Intrinsic Amplitude: 9.7 mV
Lead Channel Setting Pacing Amplitude: 1 V
Lead Channel Setting Pacing Amplitude: 1.625
Lead Channel Setting Pacing Pulse Width: 0.4 ms
Lead Channel Setting Sensing Sensitivity: 2 mV
Pulse Gen Model: 2272
Pulse Gen Serial Number: 9178446

## 2020-05-19 DIAGNOSIS — D509 Iron deficiency anemia, unspecified: Secondary | ICD-10-CM | POA: Diagnosis not present

## 2020-05-19 DIAGNOSIS — Z992 Dependence on renal dialysis: Secondary | ICD-10-CM | POA: Diagnosis not present

## 2020-05-19 DIAGNOSIS — D631 Anemia in chronic kidney disease: Secondary | ICD-10-CM | POA: Diagnosis not present

## 2020-05-19 DIAGNOSIS — N2581 Secondary hyperparathyroidism of renal origin: Secondary | ICD-10-CM | POA: Diagnosis not present

## 2020-05-19 DIAGNOSIS — N186 End stage renal disease: Secondary | ICD-10-CM | POA: Diagnosis not present

## 2020-05-20 NOTE — Progress Notes (Signed)
Remote pacemaker transmission.   

## 2020-05-21 DIAGNOSIS — N2581 Secondary hyperparathyroidism of renal origin: Secondary | ICD-10-CM | POA: Diagnosis not present

## 2020-05-21 DIAGNOSIS — N186 End stage renal disease: Secondary | ICD-10-CM | POA: Diagnosis not present

## 2020-05-21 DIAGNOSIS — D509 Iron deficiency anemia, unspecified: Secondary | ICD-10-CM | POA: Diagnosis not present

## 2020-05-21 DIAGNOSIS — Z992 Dependence on renal dialysis: Secondary | ICD-10-CM | POA: Diagnosis not present

## 2020-05-21 DIAGNOSIS — D631 Anemia in chronic kidney disease: Secondary | ICD-10-CM | POA: Diagnosis not present

## 2020-05-23 DIAGNOSIS — Z992 Dependence on renal dialysis: Secondary | ICD-10-CM | POA: Diagnosis not present

## 2020-05-23 DIAGNOSIS — D631 Anemia in chronic kidney disease: Secondary | ICD-10-CM | POA: Diagnosis not present

## 2020-05-23 DIAGNOSIS — N186 End stage renal disease: Secondary | ICD-10-CM | POA: Diagnosis not present

## 2020-05-23 DIAGNOSIS — D509 Iron deficiency anemia, unspecified: Secondary | ICD-10-CM | POA: Diagnosis not present

## 2020-05-23 DIAGNOSIS — N2581 Secondary hyperparathyroidism of renal origin: Secondary | ICD-10-CM | POA: Diagnosis not present

## 2020-05-26 DIAGNOSIS — N2581 Secondary hyperparathyroidism of renal origin: Secondary | ICD-10-CM | POA: Diagnosis not present

## 2020-05-26 DIAGNOSIS — D509 Iron deficiency anemia, unspecified: Secondary | ICD-10-CM | POA: Diagnosis not present

## 2020-05-26 DIAGNOSIS — Z992 Dependence on renal dialysis: Secondary | ICD-10-CM | POA: Diagnosis not present

## 2020-05-26 DIAGNOSIS — D631 Anemia in chronic kidney disease: Secondary | ICD-10-CM | POA: Diagnosis not present

## 2020-05-26 DIAGNOSIS — N186 End stage renal disease: Secondary | ICD-10-CM | POA: Diagnosis not present

## 2020-05-27 DIAGNOSIS — N269 Renal sclerosis, unspecified: Secondary | ICD-10-CM | POA: Diagnosis not present

## 2020-05-27 DIAGNOSIS — N186 End stage renal disease: Secondary | ICD-10-CM | POA: Diagnosis not present

## 2020-05-27 DIAGNOSIS — Z992 Dependence on renal dialysis: Secondary | ICD-10-CM | POA: Diagnosis not present

## 2020-05-28 DIAGNOSIS — D631 Anemia in chronic kidney disease: Secondary | ICD-10-CM | POA: Diagnosis not present

## 2020-05-28 DIAGNOSIS — N186 End stage renal disease: Secondary | ICD-10-CM | POA: Diagnosis not present

## 2020-05-28 DIAGNOSIS — Z992 Dependence on renal dialysis: Secondary | ICD-10-CM | POA: Diagnosis not present

## 2020-05-28 DIAGNOSIS — N2581 Secondary hyperparathyroidism of renal origin: Secondary | ICD-10-CM | POA: Diagnosis not present

## 2020-05-28 DIAGNOSIS — D509 Iron deficiency anemia, unspecified: Secondary | ICD-10-CM | POA: Diagnosis not present

## 2020-05-30 DIAGNOSIS — Z992 Dependence on renal dialysis: Secondary | ICD-10-CM | POA: Diagnosis not present

## 2020-05-30 DIAGNOSIS — N2581 Secondary hyperparathyroidism of renal origin: Secondary | ICD-10-CM | POA: Diagnosis not present

## 2020-05-30 DIAGNOSIS — D631 Anemia in chronic kidney disease: Secondary | ICD-10-CM | POA: Diagnosis not present

## 2020-05-30 DIAGNOSIS — D509 Iron deficiency anemia, unspecified: Secondary | ICD-10-CM | POA: Diagnosis not present

## 2020-05-30 DIAGNOSIS — N186 End stage renal disease: Secondary | ICD-10-CM | POA: Diagnosis not present

## 2020-06-02 DIAGNOSIS — Z992 Dependence on renal dialysis: Secondary | ICD-10-CM | POA: Diagnosis not present

## 2020-06-02 DIAGNOSIS — D631 Anemia in chronic kidney disease: Secondary | ICD-10-CM | POA: Diagnosis not present

## 2020-06-02 DIAGNOSIS — N2581 Secondary hyperparathyroidism of renal origin: Secondary | ICD-10-CM | POA: Diagnosis not present

## 2020-06-02 DIAGNOSIS — N186 End stage renal disease: Secondary | ICD-10-CM | POA: Diagnosis not present

## 2020-06-02 DIAGNOSIS — D509 Iron deficiency anemia, unspecified: Secondary | ICD-10-CM | POA: Diagnosis not present

## 2020-06-04 DIAGNOSIS — D509 Iron deficiency anemia, unspecified: Secondary | ICD-10-CM | POA: Diagnosis not present

## 2020-06-04 DIAGNOSIS — N186 End stage renal disease: Secondary | ICD-10-CM | POA: Diagnosis not present

## 2020-06-04 DIAGNOSIS — Z992 Dependence on renal dialysis: Secondary | ICD-10-CM | POA: Diagnosis not present

## 2020-06-04 DIAGNOSIS — D631 Anemia in chronic kidney disease: Secondary | ICD-10-CM | POA: Diagnosis not present

## 2020-06-04 DIAGNOSIS — N2581 Secondary hyperparathyroidism of renal origin: Secondary | ICD-10-CM | POA: Diagnosis not present

## 2020-06-06 DIAGNOSIS — N2581 Secondary hyperparathyroidism of renal origin: Secondary | ICD-10-CM | POA: Diagnosis not present

## 2020-06-06 DIAGNOSIS — D509 Iron deficiency anemia, unspecified: Secondary | ICD-10-CM | POA: Diagnosis not present

## 2020-06-06 DIAGNOSIS — D631 Anemia in chronic kidney disease: Secondary | ICD-10-CM | POA: Diagnosis not present

## 2020-06-06 DIAGNOSIS — N186 End stage renal disease: Secondary | ICD-10-CM | POA: Diagnosis not present

## 2020-06-06 DIAGNOSIS — Z992 Dependence on renal dialysis: Secondary | ICD-10-CM | POA: Diagnosis not present

## 2020-06-09 DIAGNOSIS — N2581 Secondary hyperparathyroidism of renal origin: Secondary | ICD-10-CM | POA: Diagnosis not present

## 2020-06-09 DIAGNOSIS — D509 Iron deficiency anemia, unspecified: Secondary | ICD-10-CM | POA: Diagnosis not present

## 2020-06-09 DIAGNOSIS — D631 Anemia in chronic kidney disease: Secondary | ICD-10-CM | POA: Diagnosis not present

## 2020-06-09 DIAGNOSIS — Z992 Dependence on renal dialysis: Secondary | ICD-10-CM | POA: Diagnosis not present

## 2020-06-09 DIAGNOSIS — N186 End stage renal disease: Secondary | ICD-10-CM | POA: Diagnosis not present

## 2020-06-11 DIAGNOSIS — D509 Iron deficiency anemia, unspecified: Secondary | ICD-10-CM | POA: Diagnosis not present

## 2020-06-11 DIAGNOSIS — N186 End stage renal disease: Secondary | ICD-10-CM | POA: Diagnosis not present

## 2020-06-11 DIAGNOSIS — N2581 Secondary hyperparathyroidism of renal origin: Secondary | ICD-10-CM | POA: Diagnosis not present

## 2020-06-11 DIAGNOSIS — D631 Anemia in chronic kidney disease: Secondary | ICD-10-CM | POA: Diagnosis not present

## 2020-06-11 DIAGNOSIS — Z992 Dependence on renal dialysis: Secondary | ICD-10-CM | POA: Diagnosis not present

## 2020-06-13 DIAGNOSIS — N186 End stage renal disease: Secondary | ICD-10-CM | POA: Diagnosis not present

## 2020-06-13 DIAGNOSIS — D631 Anemia in chronic kidney disease: Secondary | ICD-10-CM | POA: Diagnosis not present

## 2020-06-13 DIAGNOSIS — N2581 Secondary hyperparathyroidism of renal origin: Secondary | ICD-10-CM | POA: Diagnosis not present

## 2020-06-13 DIAGNOSIS — D509 Iron deficiency anemia, unspecified: Secondary | ICD-10-CM | POA: Diagnosis not present

## 2020-06-13 DIAGNOSIS — Z992 Dependence on renal dialysis: Secondary | ICD-10-CM | POA: Diagnosis not present

## 2020-06-16 DIAGNOSIS — N2581 Secondary hyperparathyroidism of renal origin: Secondary | ICD-10-CM | POA: Diagnosis not present

## 2020-06-16 DIAGNOSIS — D631 Anemia in chronic kidney disease: Secondary | ICD-10-CM | POA: Diagnosis not present

## 2020-06-16 DIAGNOSIS — D509 Iron deficiency anemia, unspecified: Secondary | ICD-10-CM | POA: Diagnosis not present

## 2020-06-16 DIAGNOSIS — Z992 Dependence on renal dialysis: Secondary | ICD-10-CM | POA: Diagnosis not present

## 2020-06-16 DIAGNOSIS — N186 End stage renal disease: Secondary | ICD-10-CM | POA: Diagnosis not present

## 2020-06-18 DIAGNOSIS — D631 Anemia in chronic kidney disease: Secondary | ICD-10-CM | POA: Diagnosis not present

## 2020-06-18 DIAGNOSIS — N2581 Secondary hyperparathyroidism of renal origin: Secondary | ICD-10-CM | POA: Diagnosis not present

## 2020-06-18 DIAGNOSIS — Z992 Dependence on renal dialysis: Secondary | ICD-10-CM | POA: Diagnosis not present

## 2020-06-18 DIAGNOSIS — D509 Iron deficiency anemia, unspecified: Secondary | ICD-10-CM | POA: Diagnosis not present

## 2020-06-18 DIAGNOSIS — N186 End stage renal disease: Secondary | ICD-10-CM | POA: Diagnosis not present

## 2020-06-20 DIAGNOSIS — N186 End stage renal disease: Secondary | ICD-10-CM | POA: Diagnosis not present

## 2020-06-20 DIAGNOSIS — D631 Anemia in chronic kidney disease: Secondary | ICD-10-CM | POA: Diagnosis not present

## 2020-06-20 DIAGNOSIS — N2581 Secondary hyperparathyroidism of renal origin: Secondary | ICD-10-CM | POA: Diagnosis not present

## 2020-06-20 DIAGNOSIS — D509 Iron deficiency anemia, unspecified: Secondary | ICD-10-CM | POA: Diagnosis not present

## 2020-06-20 DIAGNOSIS — Z992 Dependence on renal dialysis: Secondary | ICD-10-CM | POA: Diagnosis not present

## 2020-06-23 DIAGNOSIS — N186 End stage renal disease: Secondary | ICD-10-CM | POA: Diagnosis not present

## 2020-06-23 DIAGNOSIS — D631 Anemia in chronic kidney disease: Secondary | ICD-10-CM | POA: Diagnosis not present

## 2020-06-23 DIAGNOSIS — D509 Iron deficiency anemia, unspecified: Secondary | ICD-10-CM | POA: Diagnosis not present

## 2020-06-23 DIAGNOSIS — N2581 Secondary hyperparathyroidism of renal origin: Secondary | ICD-10-CM | POA: Diagnosis not present

## 2020-06-23 DIAGNOSIS — Z992 Dependence on renal dialysis: Secondary | ICD-10-CM | POA: Diagnosis not present

## 2020-06-25 DIAGNOSIS — N186 End stage renal disease: Secondary | ICD-10-CM | POA: Diagnosis not present

## 2020-06-25 DIAGNOSIS — Z992 Dependence on renal dialysis: Secondary | ICD-10-CM | POA: Diagnosis not present

## 2020-06-25 DIAGNOSIS — D509 Iron deficiency anemia, unspecified: Secondary | ICD-10-CM | POA: Diagnosis not present

## 2020-06-25 DIAGNOSIS — N2581 Secondary hyperparathyroidism of renal origin: Secondary | ICD-10-CM | POA: Diagnosis not present

## 2020-06-25 DIAGNOSIS — D631 Anemia in chronic kidney disease: Secondary | ICD-10-CM | POA: Diagnosis not present

## 2020-06-27 DIAGNOSIS — N269 Renal sclerosis, unspecified: Secondary | ICD-10-CM | POA: Diagnosis not present

## 2020-06-27 DIAGNOSIS — D509 Iron deficiency anemia, unspecified: Secondary | ICD-10-CM | POA: Diagnosis not present

## 2020-06-27 DIAGNOSIS — N2581 Secondary hyperparathyroidism of renal origin: Secondary | ICD-10-CM | POA: Diagnosis not present

## 2020-06-27 DIAGNOSIS — D631 Anemia in chronic kidney disease: Secondary | ICD-10-CM | POA: Diagnosis not present

## 2020-06-27 DIAGNOSIS — Z992 Dependence on renal dialysis: Secondary | ICD-10-CM | POA: Diagnosis not present

## 2020-06-27 DIAGNOSIS — N186 End stage renal disease: Secondary | ICD-10-CM | POA: Diagnosis not present

## 2020-06-30 DIAGNOSIS — D509 Iron deficiency anemia, unspecified: Secondary | ICD-10-CM | POA: Diagnosis not present

## 2020-06-30 DIAGNOSIS — N186 End stage renal disease: Secondary | ICD-10-CM | POA: Diagnosis not present

## 2020-06-30 DIAGNOSIS — N2581 Secondary hyperparathyroidism of renal origin: Secondary | ICD-10-CM | POA: Diagnosis not present

## 2020-06-30 DIAGNOSIS — D631 Anemia in chronic kidney disease: Secondary | ICD-10-CM | POA: Diagnosis not present

## 2020-06-30 DIAGNOSIS — Z992 Dependence on renal dialysis: Secondary | ICD-10-CM | POA: Diagnosis not present

## 2020-07-02 DIAGNOSIS — N186 End stage renal disease: Secondary | ICD-10-CM | POA: Diagnosis not present

## 2020-07-02 DIAGNOSIS — D631 Anemia in chronic kidney disease: Secondary | ICD-10-CM | POA: Diagnosis not present

## 2020-07-02 DIAGNOSIS — N2581 Secondary hyperparathyroidism of renal origin: Secondary | ICD-10-CM | POA: Diagnosis not present

## 2020-07-02 DIAGNOSIS — D509 Iron deficiency anemia, unspecified: Secondary | ICD-10-CM | POA: Diagnosis not present

## 2020-07-02 DIAGNOSIS — Z992 Dependence on renal dialysis: Secondary | ICD-10-CM | POA: Diagnosis not present

## 2020-07-04 DIAGNOSIS — D631 Anemia in chronic kidney disease: Secondary | ICD-10-CM | POA: Diagnosis not present

## 2020-07-04 DIAGNOSIS — N2581 Secondary hyperparathyroidism of renal origin: Secondary | ICD-10-CM | POA: Diagnosis not present

## 2020-07-04 DIAGNOSIS — N186 End stage renal disease: Secondary | ICD-10-CM | POA: Diagnosis not present

## 2020-07-04 DIAGNOSIS — D509 Iron deficiency anemia, unspecified: Secondary | ICD-10-CM | POA: Diagnosis not present

## 2020-07-04 DIAGNOSIS — Z992 Dependence on renal dialysis: Secondary | ICD-10-CM | POA: Diagnosis not present

## 2020-07-07 DIAGNOSIS — N186 End stage renal disease: Secondary | ICD-10-CM | POA: Diagnosis not present

## 2020-07-07 DIAGNOSIS — N2581 Secondary hyperparathyroidism of renal origin: Secondary | ICD-10-CM | POA: Diagnosis not present

## 2020-07-07 DIAGNOSIS — D509 Iron deficiency anemia, unspecified: Secondary | ICD-10-CM | POA: Diagnosis not present

## 2020-07-07 DIAGNOSIS — Z992 Dependence on renal dialysis: Secondary | ICD-10-CM | POA: Diagnosis not present

## 2020-07-07 DIAGNOSIS — D631 Anemia in chronic kidney disease: Secondary | ICD-10-CM | POA: Diagnosis not present

## 2020-07-09 DIAGNOSIS — D631 Anemia in chronic kidney disease: Secondary | ICD-10-CM | POA: Diagnosis not present

## 2020-07-09 DIAGNOSIS — N186 End stage renal disease: Secondary | ICD-10-CM | POA: Diagnosis not present

## 2020-07-09 DIAGNOSIS — Z992 Dependence on renal dialysis: Secondary | ICD-10-CM | POA: Diagnosis not present

## 2020-07-09 DIAGNOSIS — N2581 Secondary hyperparathyroidism of renal origin: Secondary | ICD-10-CM | POA: Diagnosis not present

## 2020-07-09 DIAGNOSIS — D509 Iron deficiency anemia, unspecified: Secondary | ICD-10-CM | POA: Diagnosis not present

## 2020-07-11 DIAGNOSIS — Z992 Dependence on renal dialysis: Secondary | ICD-10-CM | POA: Diagnosis not present

## 2020-07-11 DIAGNOSIS — N2581 Secondary hyperparathyroidism of renal origin: Secondary | ICD-10-CM | POA: Diagnosis not present

## 2020-07-11 DIAGNOSIS — D631 Anemia in chronic kidney disease: Secondary | ICD-10-CM | POA: Diagnosis not present

## 2020-07-11 DIAGNOSIS — N186 End stage renal disease: Secondary | ICD-10-CM | POA: Diagnosis not present

## 2020-07-11 DIAGNOSIS — D509 Iron deficiency anemia, unspecified: Secondary | ICD-10-CM | POA: Diagnosis not present

## 2020-07-14 DIAGNOSIS — N186 End stage renal disease: Secondary | ICD-10-CM | POA: Diagnosis not present

## 2020-07-14 DIAGNOSIS — D509 Iron deficiency anemia, unspecified: Secondary | ICD-10-CM | POA: Diagnosis not present

## 2020-07-14 DIAGNOSIS — Z992 Dependence on renal dialysis: Secondary | ICD-10-CM | POA: Diagnosis not present

## 2020-07-14 DIAGNOSIS — N2581 Secondary hyperparathyroidism of renal origin: Secondary | ICD-10-CM | POA: Diagnosis not present

## 2020-07-14 DIAGNOSIS — D631 Anemia in chronic kidney disease: Secondary | ICD-10-CM | POA: Diagnosis not present

## 2020-07-16 DIAGNOSIS — D509 Iron deficiency anemia, unspecified: Secondary | ICD-10-CM | POA: Diagnosis not present

## 2020-07-16 DIAGNOSIS — D631 Anemia in chronic kidney disease: Secondary | ICD-10-CM | POA: Diagnosis not present

## 2020-07-16 DIAGNOSIS — N2581 Secondary hyperparathyroidism of renal origin: Secondary | ICD-10-CM | POA: Diagnosis not present

## 2020-07-16 DIAGNOSIS — Z992 Dependence on renal dialysis: Secondary | ICD-10-CM | POA: Diagnosis not present

## 2020-07-16 DIAGNOSIS — N186 End stage renal disease: Secondary | ICD-10-CM | POA: Diagnosis not present

## 2020-07-17 DIAGNOSIS — H401113 Primary open-angle glaucoma, right eye, severe stage: Secondary | ICD-10-CM | POA: Diagnosis not present

## 2020-07-17 DIAGNOSIS — H401124 Primary open-angle glaucoma, left eye, indeterminate stage: Secondary | ICD-10-CM | POA: Diagnosis not present

## 2020-07-18 DIAGNOSIS — N186 End stage renal disease: Secondary | ICD-10-CM | POA: Diagnosis not present

## 2020-07-18 DIAGNOSIS — N2581 Secondary hyperparathyroidism of renal origin: Secondary | ICD-10-CM | POA: Diagnosis not present

## 2020-07-18 DIAGNOSIS — Z992 Dependence on renal dialysis: Secondary | ICD-10-CM | POA: Diagnosis not present

## 2020-07-18 DIAGNOSIS — D509 Iron deficiency anemia, unspecified: Secondary | ICD-10-CM | POA: Diagnosis not present

## 2020-07-18 DIAGNOSIS — D631 Anemia in chronic kidney disease: Secondary | ICD-10-CM | POA: Diagnosis not present

## 2020-07-21 DIAGNOSIS — N2581 Secondary hyperparathyroidism of renal origin: Secondary | ICD-10-CM | POA: Diagnosis not present

## 2020-07-21 DIAGNOSIS — D631 Anemia in chronic kidney disease: Secondary | ICD-10-CM | POA: Diagnosis not present

## 2020-07-21 DIAGNOSIS — Z992 Dependence on renal dialysis: Secondary | ICD-10-CM | POA: Diagnosis not present

## 2020-07-21 DIAGNOSIS — D509 Iron deficiency anemia, unspecified: Secondary | ICD-10-CM | POA: Diagnosis not present

## 2020-07-21 DIAGNOSIS — N186 End stage renal disease: Secondary | ICD-10-CM | POA: Diagnosis not present

## 2020-07-23 DIAGNOSIS — D509 Iron deficiency anemia, unspecified: Secondary | ICD-10-CM | POA: Diagnosis not present

## 2020-07-23 DIAGNOSIS — D631 Anemia in chronic kidney disease: Secondary | ICD-10-CM | POA: Diagnosis not present

## 2020-07-23 DIAGNOSIS — Z992 Dependence on renal dialysis: Secondary | ICD-10-CM | POA: Diagnosis not present

## 2020-07-23 DIAGNOSIS — N186 End stage renal disease: Secondary | ICD-10-CM | POA: Diagnosis not present

## 2020-07-23 DIAGNOSIS — N2581 Secondary hyperparathyroidism of renal origin: Secondary | ICD-10-CM | POA: Diagnosis not present

## 2020-07-25 DIAGNOSIS — N2581 Secondary hyperparathyroidism of renal origin: Secondary | ICD-10-CM | POA: Diagnosis not present

## 2020-07-25 DIAGNOSIS — D631 Anemia in chronic kidney disease: Secondary | ICD-10-CM | POA: Diagnosis not present

## 2020-07-25 DIAGNOSIS — N186 End stage renal disease: Secondary | ICD-10-CM | POA: Diagnosis not present

## 2020-07-25 DIAGNOSIS — Z992 Dependence on renal dialysis: Secondary | ICD-10-CM | POA: Diagnosis not present

## 2020-07-25 DIAGNOSIS — D509 Iron deficiency anemia, unspecified: Secondary | ICD-10-CM | POA: Diagnosis not present

## 2020-07-27 DIAGNOSIS — N186 End stage renal disease: Secondary | ICD-10-CM | POA: Diagnosis not present

## 2020-07-27 DIAGNOSIS — Z992 Dependence on renal dialysis: Secondary | ICD-10-CM | POA: Diagnosis not present

## 2020-07-27 DIAGNOSIS — N269 Renal sclerosis, unspecified: Secondary | ICD-10-CM | POA: Diagnosis not present

## 2020-07-28 DIAGNOSIS — N2581 Secondary hyperparathyroidism of renal origin: Secondary | ICD-10-CM | POA: Diagnosis not present

## 2020-07-28 DIAGNOSIS — D509 Iron deficiency anemia, unspecified: Secondary | ICD-10-CM | POA: Diagnosis not present

## 2020-07-28 DIAGNOSIS — Z992 Dependence on renal dialysis: Secondary | ICD-10-CM | POA: Diagnosis not present

## 2020-07-28 DIAGNOSIS — D631 Anemia in chronic kidney disease: Secondary | ICD-10-CM | POA: Diagnosis not present

## 2020-07-28 DIAGNOSIS — N186 End stage renal disease: Secondary | ICD-10-CM | POA: Diagnosis not present

## 2020-07-30 DIAGNOSIS — D509 Iron deficiency anemia, unspecified: Secondary | ICD-10-CM | POA: Diagnosis not present

## 2020-07-30 DIAGNOSIS — N2581 Secondary hyperparathyroidism of renal origin: Secondary | ICD-10-CM | POA: Diagnosis not present

## 2020-07-30 DIAGNOSIS — D631 Anemia in chronic kidney disease: Secondary | ICD-10-CM | POA: Diagnosis not present

## 2020-07-30 DIAGNOSIS — Z992 Dependence on renal dialysis: Secondary | ICD-10-CM | POA: Diagnosis not present

## 2020-07-30 DIAGNOSIS — N186 End stage renal disease: Secondary | ICD-10-CM | POA: Diagnosis not present

## 2020-07-31 IMAGING — US US CAROTID DUPLEX BILAT
1 series · 13 of 24 positions shown · non-contrast
Comparison: None.

CLINICAL DATA: Bruit

EXAM:
BILATERAL CAROTID DUPLEX ULTRASOUND
TECHNIQUE: Gray scale imaging, color Doppler and duplex ultrasound were
performed of bilateral carotid and vertebral arteries in the neck.

[Series 1: us carotid duplex bilat · 0.06mm/px · 13 of 66 slices shown]
[im 1/66]
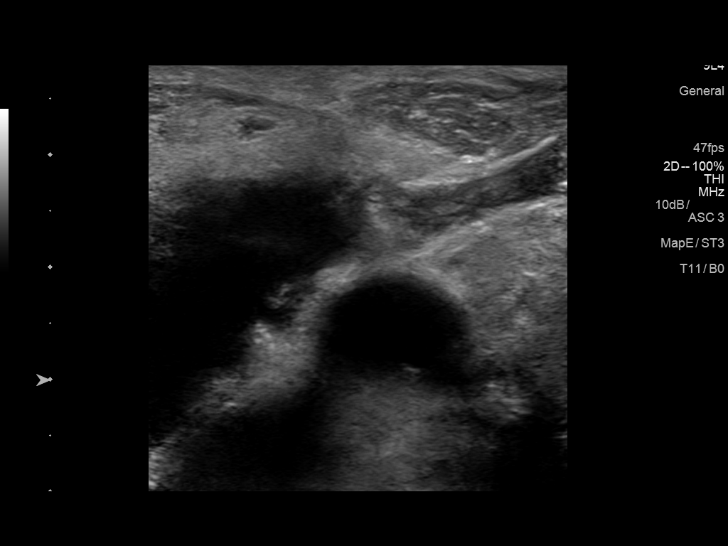
[im 6/66]
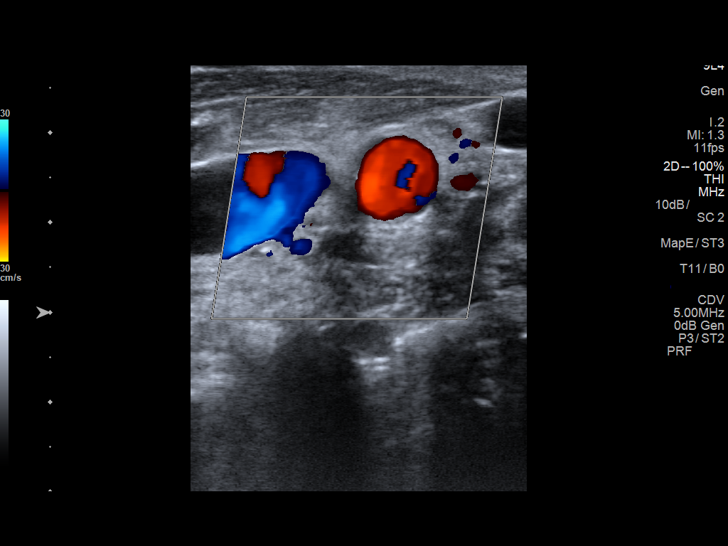
[im 12/66]
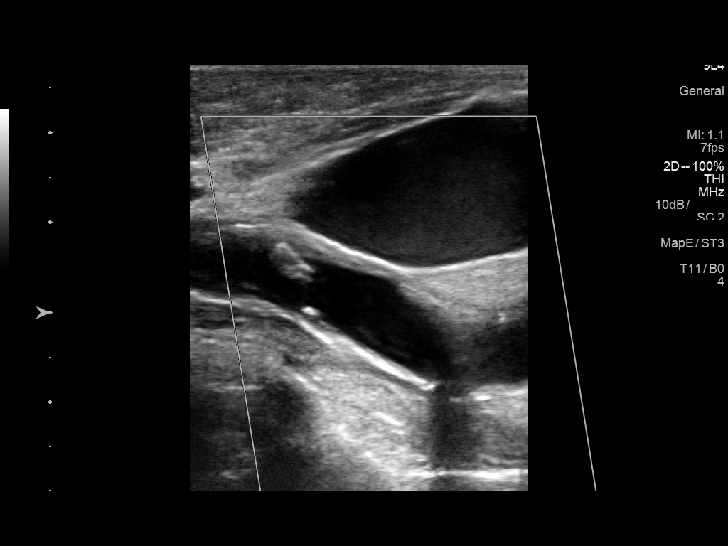
[im 17/66]
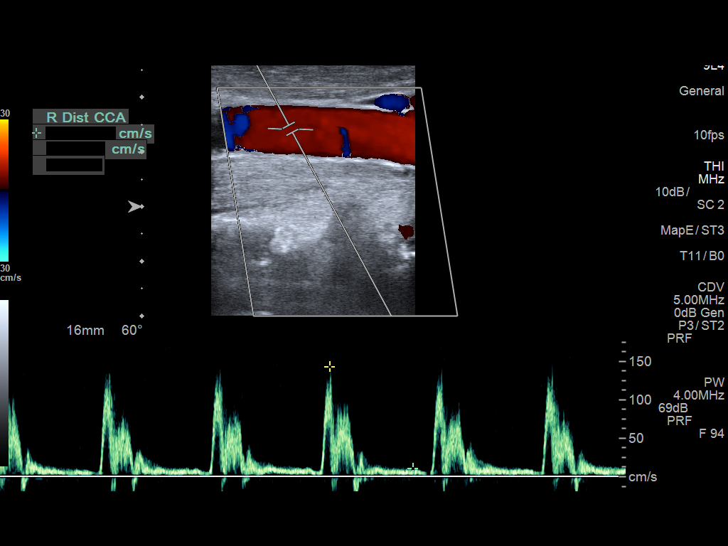
[im 23/66]
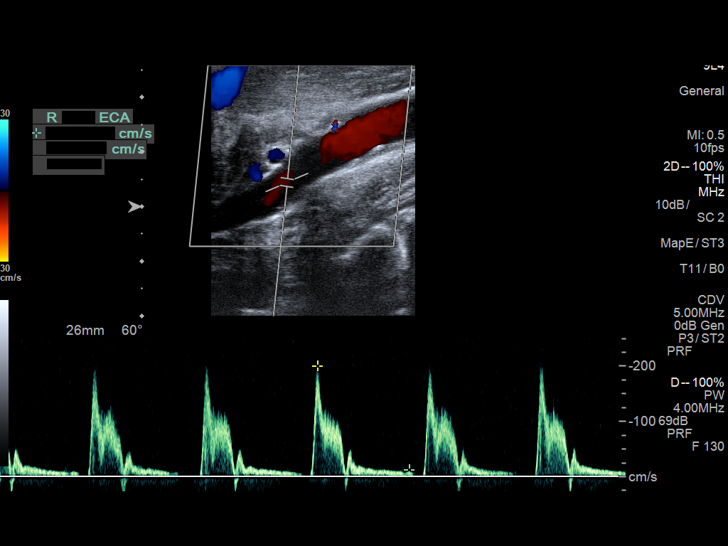
[im 29/66]
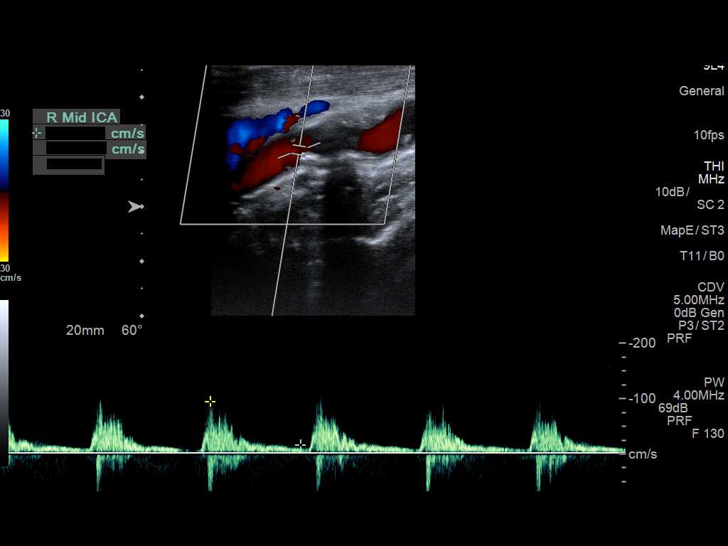
[im 34/66]
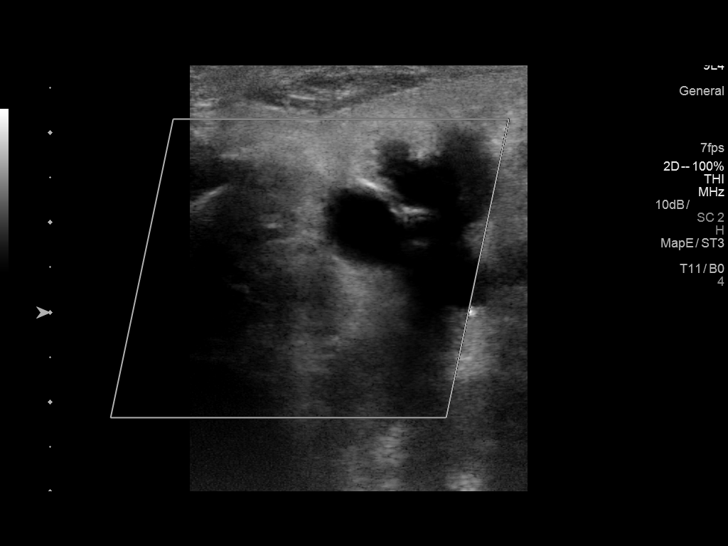
[im 37/66]
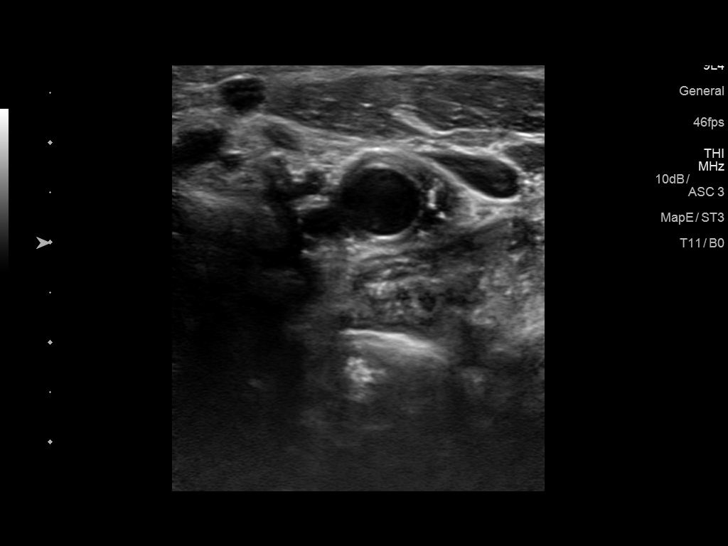
[im 43/66]
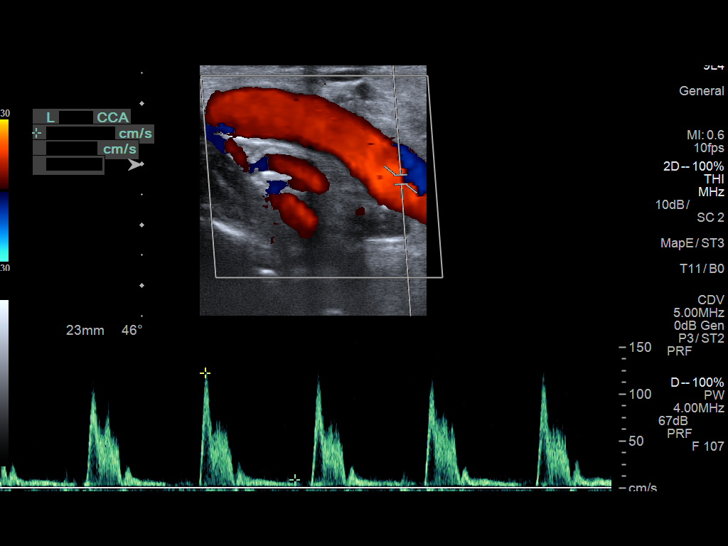
[im 49/66]
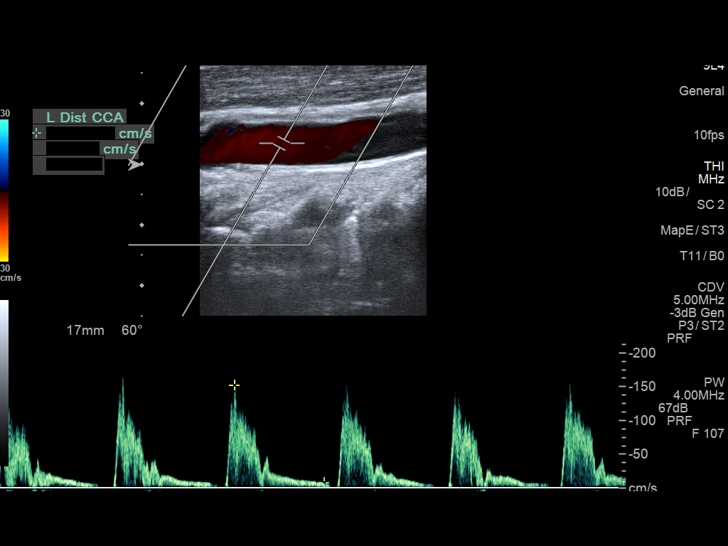
[im 54/66]
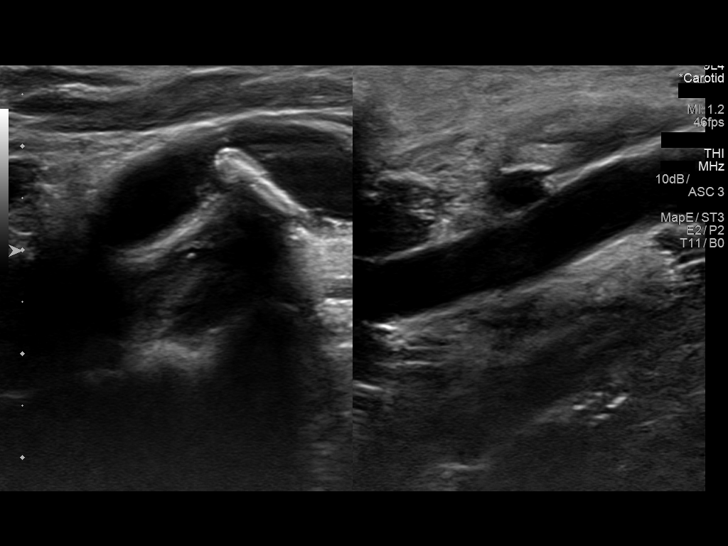
[im 60/66]
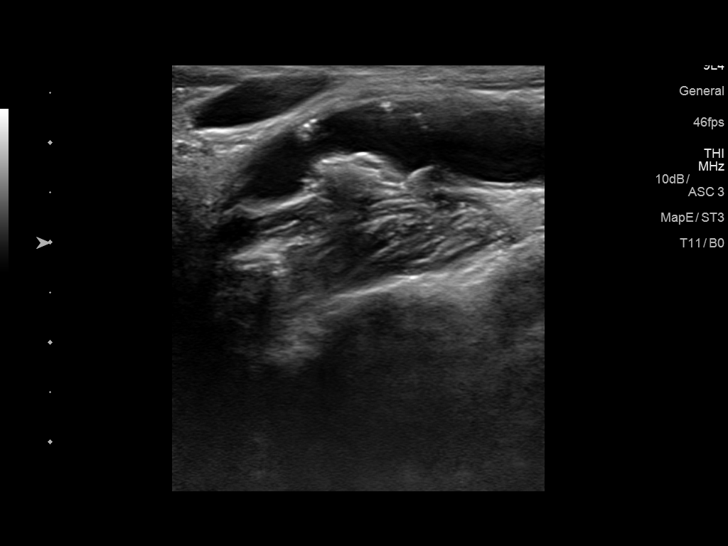
[im 66/66]
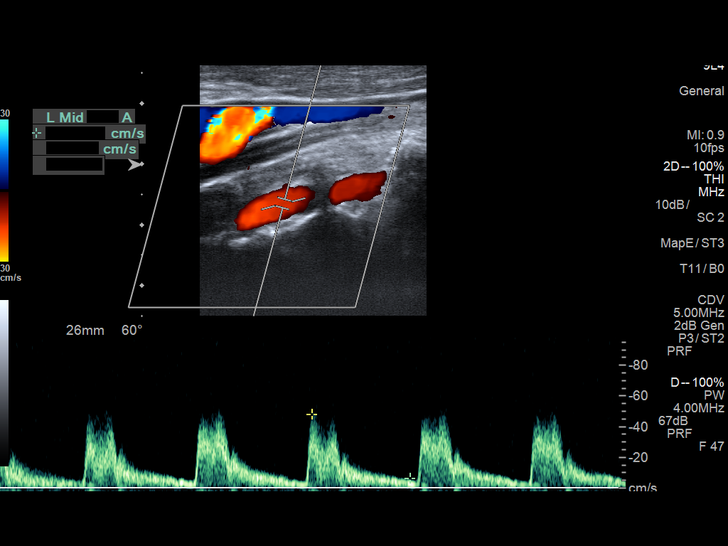

[13 of 24 positions shown; findings below may reference images not displayed]

FINDINGS: Criteria: Quantification of carotid stenosis is based on velocity
parameters that correlate the residual internal carotid diameter
with NASCET-based stenosis levels, using the diameter of the distal
internal carotid lumen as the denominator for stenosis measurement.

The following velocity measurements were obtained:

RIGHT

ICA: 148 cm/sec

CCA: 181 cm/sec

SYSTOLIC ICA/CCA RATIO:

ECA: 201 cm/sec

LEFT

ICA: 126 cm/sec

CCA: 183 cm/sec

SYSTOLIC ICA/CCA RATIO:

ECA: 196 cm/sec

RIGHT CAROTID ARTERY: Moderate irregular calcified plaque in the
bulb. There is some irregular plaque in the mid common carotid
artery. Low resistance internal carotid Doppler pattern.

RIGHT VERTEBRAL ARTERY:  Antegrade.

LEFT CAROTID ARTERY: Moderate irregular calcified plaque in the bulb
and lower internal carotid artery. Low resistance internal carotid
Doppler pattern is preserved.

LEFT VERTEBRAL ARTERY:  Antegrade.

Upper extremity blood pressures: RIGHT: 152/61 LEFT: Not obtained
IMPRESSION: 50-69% stenosis in the right and left internal carotid arteries.

## 2020-08-01 DIAGNOSIS — Z992 Dependence on renal dialysis: Secondary | ICD-10-CM | POA: Diagnosis not present

## 2020-08-01 DIAGNOSIS — N2581 Secondary hyperparathyroidism of renal origin: Secondary | ICD-10-CM | POA: Diagnosis not present

## 2020-08-01 DIAGNOSIS — D509 Iron deficiency anemia, unspecified: Secondary | ICD-10-CM | POA: Diagnosis not present

## 2020-08-01 DIAGNOSIS — N186 End stage renal disease: Secondary | ICD-10-CM | POA: Diagnosis not present

## 2020-08-01 DIAGNOSIS — D631 Anemia in chronic kidney disease: Secondary | ICD-10-CM | POA: Diagnosis not present

## 2020-08-04 DIAGNOSIS — D509 Iron deficiency anemia, unspecified: Secondary | ICD-10-CM | POA: Diagnosis not present

## 2020-08-04 DIAGNOSIS — D631 Anemia in chronic kidney disease: Secondary | ICD-10-CM | POA: Diagnosis not present

## 2020-08-04 DIAGNOSIS — N2581 Secondary hyperparathyroidism of renal origin: Secondary | ICD-10-CM | POA: Diagnosis not present

## 2020-08-04 DIAGNOSIS — N186 End stage renal disease: Secondary | ICD-10-CM | POA: Diagnosis not present

## 2020-08-04 DIAGNOSIS — Z992 Dependence on renal dialysis: Secondary | ICD-10-CM | POA: Diagnosis not present

## 2020-08-06 DIAGNOSIS — N2581 Secondary hyperparathyroidism of renal origin: Secondary | ICD-10-CM | POA: Diagnosis not present

## 2020-08-06 DIAGNOSIS — N186 End stage renal disease: Secondary | ICD-10-CM | POA: Diagnosis not present

## 2020-08-06 DIAGNOSIS — D631 Anemia in chronic kidney disease: Secondary | ICD-10-CM | POA: Diagnosis not present

## 2020-08-06 DIAGNOSIS — D509 Iron deficiency anemia, unspecified: Secondary | ICD-10-CM | POA: Diagnosis not present

## 2020-08-06 DIAGNOSIS — Z992 Dependence on renal dialysis: Secondary | ICD-10-CM | POA: Diagnosis not present

## 2020-08-08 DIAGNOSIS — D631 Anemia in chronic kidney disease: Secondary | ICD-10-CM | POA: Diagnosis not present

## 2020-08-08 DIAGNOSIS — D509 Iron deficiency anemia, unspecified: Secondary | ICD-10-CM | POA: Diagnosis not present

## 2020-08-08 DIAGNOSIS — N186 End stage renal disease: Secondary | ICD-10-CM | POA: Diagnosis not present

## 2020-08-08 DIAGNOSIS — Z992 Dependence on renal dialysis: Secondary | ICD-10-CM | POA: Diagnosis not present

## 2020-08-08 DIAGNOSIS — N2581 Secondary hyperparathyroidism of renal origin: Secondary | ICD-10-CM | POA: Diagnosis not present

## 2020-08-11 DIAGNOSIS — N186 End stage renal disease: Secondary | ICD-10-CM | POA: Diagnosis not present

## 2020-08-11 DIAGNOSIS — D631 Anemia in chronic kidney disease: Secondary | ICD-10-CM | POA: Diagnosis not present

## 2020-08-11 DIAGNOSIS — Z992 Dependence on renal dialysis: Secondary | ICD-10-CM | POA: Diagnosis not present

## 2020-08-11 DIAGNOSIS — D509 Iron deficiency anemia, unspecified: Secondary | ICD-10-CM | POA: Diagnosis not present

## 2020-08-11 DIAGNOSIS — N2581 Secondary hyperparathyroidism of renal origin: Secondary | ICD-10-CM | POA: Diagnosis not present

## 2020-08-13 DIAGNOSIS — Z992 Dependence on renal dialysis: Secondary | ICD-10-CM | POA: Diagnosis not present

## 2020-08-13 DIAGNOSIS — D509 Iron deficiency anemia, unspecified: Secondary | ICD-10-CM | POA: Diagnosis not present

## 2020-08-13 DIAGNOSIS — N2581 Secondary hyperparathyroidism of renal origin: Secondary | ICD-10-CM | POA: Diagnosis not present

## 2020-08-13 DIAGNOSIS — N186 End stage renal disease: Secondary | ICD-10-CM | POA: Diagnosis not present

## 2020-08-13 DIAGNOSIS — D631 Anemia in chronic kidney disease: Secondary | ICD-10-CM | POA: Diagnosis not present

## 2020-08-15 ENCOUNTER — Ambulatory Visit (INDEPENDENT_AMBULATORY_CARE_PROVIDER_SITE_OTHER): Payer: Medicare Other

## 2020-08-15 DIAGNOSIS — N2581 Secondary hyperparathyroidism of renal origin: Secondary | ICD-10-CM | POA: Diagnosis not present

## 2020-08-15 DIAGNOSIS — I442 Atrioventricular block, complete: Secondary | ICD-10-CM | POA: Diagnosis not present

## 2020-08-15 DIAGNOSIS — D509 Iron deficiency anemia, unspecified: Secondary | ICD-10-CM | POA: Diagnosis not present

## 2020-08-15 DIAGNOSIS — D631 Anemia in chronic kidney disease: Secondary | ICD-10-CM | POA: Diagnosis not present

## 2020-08-15 DIAGNOSIS — Z992 Dependence on renal dialysis: Secondary | ICD-10-CM | POA: Diagnosis not present

## 2020-08-15 DIAGNOSIS — N186 End stage renal disease: Secondary | ICD-10-CM | POA: Diagnosis not present

## 2020-08-15 LAB — CUP PACEART REMOTE DEVICE CHECK
Battery Remaining Longevity: 126 mo
Battery Remaining Percentage: 95.5 %
Battery Voltage: 3.01 V
Brady Statistic AP VP Percent: 89 %
Brady Statistic AP VS Percent: 1 %
Brady Statistic AS VP Percent: 8.7 %
Brady Statistic AS VS Percent: 2.1 %
Brady Statistic RA Percent Paced: 14 %
Brady Statistic RV Percent Paced: 99 %
Date Time Interrogation Session: 20220520020014
Implantable Lead Implant Date: 20201113
Implantable Lead Implant Date: 20201113
Implantable Lead Location: 753859
Implantable Lead Location: 753860
Implantable Pulse Generator Implant Date: 20201113
Lead Channel Impedance Value: 400 Ohm
Lead Channel Impedance Value: 410 Ohm
Lead Channel Pacing Threshold Amplitude: 0.625 V
Lead Channel Pacing Threshold Amplitude: 0.875 V
Lead Channel Pacing Threshold Pulse Width: 0.4 ms
Lead Channel Pacing Threshold Pulse Width: 0.4 ms
Lead Channel Sensing Intrinsic Amplitude: 5 mV
Lead Channel Sensing Intrinsic Amplitude: 9.7 mV
Lead Channel Setting Pacing Amplitude: 1.125
Lead Channel Setting Pacing Amplitude: 1.625
Lead Channel Setting Pacing Pulse Width: 0.4 ms
Lead Channel Setting Sensing Sensitivity: 2 mV
Pulse Gen Model: 2272
Pulse Gen Serial Number: 9178446

## 2020-08-18 DIAGNOSIS — Z992 Dependence on renal dialysis: Secondary | ICD-10-CM | POA: Diagnosis not present

## 2020-08-18 DIAGNOSIS — D509 Iron deficiency anemia, unspecified: Secondary | ICD-10-CM | POA: Diagnosis not present

## 2020-08-18 DIAGNOSIS — D631 Anemia in chronic kidney disease: Secondary | ICD-10-CM | POA: Diagnosis not present

## 2020-08-18 DIAGNOSIS — N186 End stage renal disease: Secondary | ICD-10-CM | POA: Diagnosis not present

## 2020-08-18 DIAGNOSIS — N2581 Secondary hyperparathyroidism of renal origin: Secondary | ICD-10-CM | POA: Diagnosis not present

## 2020-08-20 DIAGNOSIS — N2581 Secondary hyperparathyroidism of renal origin: Secondary | ICD-10-CM | POA: Diagnosis not present

## 2020-08-20 DIAGNOSIS — D631 Anemia in chronic kidney disease: Secondary | ICD-10-CM | POA: Diagnosis not present

## 2020-08-20 DIAGNOSIS — Z992 Dependence on renal dialysis: Secondary | ICD-10-CM | POA: Diagnosis not present

## 2020-08-20 DIAGNOSIS — N186 End stage renal disease: Secondary | ICD-10-CM | POA: Diagnosis not present

## 2020-08-20 DIAGNOSIS — D509 Iron deficiency anemia, unspecified: Secondary | ICD-10-CM | POA: Diagnosis not present

## 2020-08-21 DIAGNOSIS — H401113 Primary open-angle glaucoma, right eye, severe stage: Secondary | ICD-10-CM | POA: Diagnosis not present

## 2020-08-21 DIAGNOSIS — H401124 Primary open-angle glaucoma, left eye, indeterminate stage: Secondary | ICD-10-CM | POA: Diagnosis not present

## 2020-08-22 DIAGNOSIS — Z992 Dependence on renal dialysis: Secondary | ICD-10-CM | POA: Diagnosis not present

## 2020-08-22 DIAGNOSIS — N186 End stage renal disease: Secondary | ICD-10-CM | POA: Diagnosis not present

## 2020-08-22 DIAGNOSIS — D631 Anemia in chronic kidney disease: Secondary | ICD-10-CM | POA: Diagnosis not present

## 2020-08-22 DIAGNOSIS — D509 Iron deficiency anemia, unspecified: Secondary | ICD-10-CM | POA: Diagnosis not present

## 2020-08-22 DIAGNOSIS — N2581 Secondary hyperparathyroidism of renal origin: Secondary | ICD-10-CM | POA: Diagnosis not present

## 2020-08-25 DIAGNOSIS — N2581 Secondary hyperparathyroidism of renal origin: Secondary | ICD-10-CM | POA: Diagnosis not present

## 2020-08-25 DIAGNOSIS — D509 Iron deficiency anemia, unspecified: Secondary | ICD-10-CM | POA: Diagnosis not present

## 2020-08-25 DIAGNOSIS — D631 Anemia in chronic kidney disease: Secondary | ICD-10-CM | POA: Diagnosis not present

## 2020-08-25 DIAGNOSIS — Z992 Dependence on renal dialysis: Secondary | ICD-10-CM | POA: Diagnosis not present

## 2020-08-25 DIAGNOSIS — N186 End stage renal disease: Secondary | ICD-10-CM | POA: Diagnosis not present

## 2020-08-27 DIAGNOSIS — N2581 Secondary hyperparathyroidism of renal origin: Secondary | ICD-10-CM | POA: Diagnosis not present

## 2020-08-27 DIAGNOSIS — D631 Anemia in chronic kidney disease: Secondary | ICD-10-CM | POA: Diagnosis not present

## 2020-08-27 DIAGNOSIS — N269 Renal sclerosis, unspecified: Secondary | ICD-10-CM | POA: Diagnosis not present

## 2020-08-27 DIAGNOSIS — D509 Iron deficiency anemia, unspecified: Secondary | ICD-10-CM | POA: Diagnosis not present

## 2020-08-27 DIAGNOSIS — N186 End stage renal disease: Secondary | ICD-10-CM | POA: Diagnosis not present

## 2020-08-27 DIAGNOSIS — Z992 Dependence on renal dialysis: Secondary | ICD-10-CM | POA: Diagnosis not present

## 2020-08-29 DIAGNOSIS — N2581 Secondary hyperparathyroidism of renal origin: Secondary | ICD-10-CM | POA: Diagnosis not present

## 2020-08-29 DIAGNOSIS — D631 Anemia in chronic kidney disease: Secondary | ICD-10-CM | POA: Diagnosis not present

## 2020-08-29 DIAGNOSIS — D509 Iron deficiency anemia, unspecified: Secondary | ICD-10-CM | POA: Diagnosis not present

## 2020-08-29 DIAGNOSIS — Z992 Dependence on renal dialysis: Secondary | ICD-10-CM | POA: Diagnosis not present

## 2020-08-29 DIAGNOSIS — N186 End stage renal disease: Secondary | ICD-10-CM | POA: Diagnosis not present

## 2020-09-01 DIAGNOSIS — N2581 Secondary hyperparathyroidism of renal origin: Secondary | ICD-10-CM | POA: Diagnosis not present

## 2020-09-01 DIAGNOSIS — N186 End stage renal disease: Secondary | ICD-10-CM | POA: Diagnosis not present

## 2020-09-01 DIAGNOSIS — Z992 Dependence on renal dialysis: Secondary | ICD-10-CM | POA: Diagnosis not present

## 2020-09-01 DIAGNOSIS — D509 Iron deficiency anemia, unspecified: Secondary | ICD-10-CM | POA: Diagnosis not present

## 2020-09-01 DIAGNOSIS — D631 Anemia in chronic kidney disease: Secondary | ICD-10-CM | POA: Diagnosis not present

## 2020-09-01 NOTE — Progress Notes (Signed)
Remote pacemaker transmission.   

## 2020-09-03 DIAGNOSIS — N2581 Secondary hyperparathyroidism of renal origin: Secondary | ICD-10-CM | POA: Diagnosis not present

## 2020-09-03 DIAGNOSIS — N186 End stage renal disease: Secondary | ICD-10-CM | POA: Diagnosis not present

## 2020-09-03 DIAGNOSIS — Z992 Dependence on renal dialysis: Secondary | ICD-10-CM | POA: Diagnosis not present

## 2020-09-03 DIAGNOSIS — D631 Anemia in chronic kidney disease: Secondary | ICD-10-CM | POA: Diagnosis not present

## 2020-09-03 DIAGNOSIS — D509 Iron deficiency anemia, unspecified: Secondary | ICD-10-CM | POA: Diagnosis not present

## 2020-09-05 DIAGNOSIS — N2581 Secondary hyperparathyroidism of renal origin: Secondary | ICD-10-CM | POA: Diagnosis not present

## 2020-09-05 DIAGNOSIS — Z992 Dependence on renal dialysis: Secondary | ICD-10-CM | POA: Diagnosis not present

## 2020-09-05 DIAGNOSIS — D509 Iron deficiency anemia, unspecified: Secondary | ICD-10-CM | POA: Diagnosis not present

## 2020-09-05 DIAGNOSIS — D631 Anemia in chronic kidney disease: Secondary | ICD-10-CM | POA: Diagnosis not present

## 2020-09-05 DIAGNOSIS — N186 End stage renal disease: Secondary | ICD-10-CM | POA: Diagnosis not present

## 2020-09-06 ENCOUNTER — Other Ambulatory Visit: Payer: Self-pay | Admitting: Cardiology

## 2020-09-08 DIAGNOSIS — N2581 Secondary hyperparathyroidism of renal origin: Secondary | ICD-10-CM | POA: Diagnosis not present

## 2020-09-08 DIAGNOSIS — D631 Anemia in chronic kidney disease: Secondary | ICD-10-CM | POA: Diagnosis not present

## 2020-09-08 DIAGNOSIS — N186 End stage renal disease: Secondary | ICD-10-CM | POA: Diagnosis not present

## 2020-09-08 DIAGNOSIS — D509 Iron deficiency anemia, unspecified: Secondary | ICD-10-CM | POA: Diagnosis not present

## 2020-09-08 DIAGNOSIS — Z992 Dependence on renal dialysis: Secondary | ICD-10-CM | POA: Diagnosis not present

## 2020-09-10 DIAGNOSIS — N2581 Secondary hyperparathyroidism of renal origin: Secondary | ICD-10-CM | POA: Diagnosis not present

## 2020-09-10 DIAGNOSIS — D631 Anemia in chronic kidney disease: Secondary | ICD-10-CM | POA: Diagnosis not present

## 2020-09-10 DIAGNOSIS — D509 Iron deficiency anemia, unspecified: Secondary | ICD-10-CM | POA: Diagnosis not present

## 2020-09-10 DIAGNOSIS — N186 End stage renal disease: Secondary | ICD-10-CM | POA: Diagnosis not present

## 2020-09-10 DIAGNOSIS — Z992 Dependence on renal dialysis: Secondary | ICD-10-CM | POA: Diagnosis not present

## 2020-09-12 DIAGNOSIS — Z992 Dependence on renal dialysis: Secondary | ICD-10-CM | POA: Diagnosis not present

## 2020-09-12 DIAGNOSIS — N2581 Secondary hyperparathyroidism of renal origin: Secondary | ICD-10-CM | POA: Diagnosis not present

## 2020-09-12 DIAGNOSIS — D509 Iron deficiency anemia, unspecified: Secondary | ICD-10-CM | POA: Diagnosis not present

## 2020-09-12 DIAGNOSIS — N186 End stage renal disease: Secondary | ICD-10-CM | POA: Diagnosis not present

## 2020-09-12 DIAGNOSIS — D631 Anemia in chronic kidney disease: Secondary | ICD-10-CM | POA: Diagnosis not present

## 2020-09-15 DIAGNOSIS — Z992 Dependence on renal dialysis: Secondary | ICD-10-CM | POA: Diagnosis not present

## 2020-09-15 DIAGNOSIS — D509 Iron deficiency anemia, unspecified: Secondary | ICD-10-CM | POA: Diagnosis not present

## 2020-09-15 DIAGNOSIS — N2581 Secondary hyperparathyroidism of renal origin: Secondary | ICD-10-CM | POA: Diagnosis not present

## 2020-09-15 DIAGNOSIS — D631 Anemia in chronic kidney disease: Secondary | ICD-10-CM | POA: Diagnosis not present

## 2020-09-15 DIAGNOSIS — N186 End stage renal disease: Secondary | ICD-10-CM | POA: Diagnosis not present

## 2020-09-17 DIAGNOSIS — N2581 Secondary hyperparathyroidism of renal origin: Secondary | ICD-10-CM | POA: Diagnosis not present

## 2020-09-17 DIAGNOSIS — N186 End stage renal disease: Secondary | ICD-10-CM | POA: Diagnosis not present

## 2020-09-17 DIAGNOSIS — D509 Iron deficiency anemia, unspecified: Secondary | ICD-10-CM | POA: Diagnosis not present

## 2020-09-17 DIAGNOSIS — D631 Anemia in chronic kidney disease: Secondary | ICD-10-CM | POA: Diagnosis not present

## 2020-09-17 DIAGNOSIS — Z992 Dependence on renal dialysis: Secondary | ICD-10-CM | POA: Diagnosis not present

## 2020-09-19 DIAGNOSIS — N186 End stage renal disease: Secondary | ICD-10-CM | POA: Diagnosis not present

## 2020-09-19 DIAGNOSIS — D509 Iron deficiency anemia, unspecified: Secondary | ICD-10-CM | POA: Diagnosis not present

## 2020-09-19 DIAGNOSIS — D631 Anemia in chronic kidney disease: Secondary | ICD-10-CM | POA: Diagnosis not present

## 2020-09-19 DIAGNOSIS — N2581 Secondary hyperparathyroidism of renal origin: Secondary | ICD-10-CM | POA: Diagnosis not present

## 2020-09-19 DIAGNOSIS — Z992 Dependence on renal dialysis: Secondary | ICD-10-CM | POA: Diagnosis not present

## 2020-09-22 DIAGNOSIS — N186 End stage renal disease: Secondary | ICD-10-CM | POA: Diagnosis not present

## 2020-09-22 DIAGNOSIS — D509 Iron deficiency anemia, unspecified: Secondary | ICD-10-CM | POA: Diagnosis not present

## 2020-09-22 DIAGNOSIS — Z992 Dependence on renal dialysis: Secondary | ICD-10-CM | POA: Diagnosis not present

## 2020-09-22 DIAGNOSIS — D631 Anemia in chronic kidney disease: Secondary | ICD-10-CM | POA: Diagnosis not present

## 2020-09-22 DIAGNOSIS — N2581 Secondary hyperparathyroidism of renal origin: Secondary | ICD-10-CM | POA: Diagnosis not present

## 2020-09-24 DIAGNOSIS — D509 Iron deficiency anemia, unspecified: Secondary | ICD-10-CM | POA: Diagnosis not present

## 2020-09-24 DIAGNOSIS — N186 End stage renal disease: Secondary | ICD-10-CM | POA: Diagnosis not present

## 2020-09-24 DIAGNOSIS — Z992 Dependence on renal dialysis: Secondary | ICD-10-CM | POA: Diagnosis not present

## 2020-09-24 DIAGNOSIS — N2581 Secondary hyperparathyroidism of renal origin: Secondary | ICD-10-CM | POA: Diagnosis not present

## 2020-09-24 DIAGNOSIS — D631 Anemia in chronic kidney disease: Secondary | ICD-10-CM | POA: Diagnosis not present

## 2020-09-25 ENCOUNTER — Other Ambulatory Visit: Payer: Self-pay | Admitting: Neurology

## 2020-09-26 DIAGNOSIS — Z992 Dependence on renal dialysis: Secondary | ICD-10-CM | POA: Diagnosis not present

## 2020-09-26 DIAGNOSIS — N269 Renal sclerosis, unspecified: Secondary | ICD-10-CM | POA: Diagnosis not present

## 2020-09-26 DIAGNOSIS — N186 End stage renal disease: Secondary | ICD-10-CM | POA: Diagnosis not present

## 2020-09-26 DIAGNOSIS — N2581 Secondary hyperparathyroidism of renal origin: Secondary | ICD-10-CM | POA: Diagnosis not present

## 2020-09-29 DIAGNOSIS — Z992 Dependence on renal dialysis: Secondary | ICD-10-CM | POA: Diagnosis not present

## 2020-09-29 DIAGNOSIS — N186 End stage renal disease: Secondary | ICD-10-CM | POA: Diagnosis not present

## 2020-09-29 DIAGNOSIS — N2581 Secondary hyperparathyroidism of renal origin: Secondary | ICD-10-CM | POA: Diagnosis not present

## 2020-10-01 DIAGNOSIS — N2581 Secondary hyperparathyroidism of renal origin: Secondary | ICD-10-CM | POA: Diagnosis not present

## 2020-10-01 DIAGNOSIS — Z992 Dependence on renal dialysis: Secondary | ICD-10-CM | POA: Diagnosis not present

## 2020-10-01 DIAGNOSIS — N186 End stage renal disease: Secondary | ICD-10-CM | POA: Diagnosis not present

## 2020-10-03 DIAGNOSIS — Z992 Dependence on renal dialysis: Secondary | ICD-10-CM | POA: Diagnosis not present

## 2020-10-03 DIAGNOSIS — N186 End stage renal disease: Secondary | ICD-10-CM | POA: Diagnosis not present

## 2020-10-03 DIAGNOSIS — N2581 Secondary hyperparathyroidism of renal origin: Secondary | ICD-10-CM | POA: Diagnosis not present

## 2020-10-06 ENCOUNTER — Other Ambulatory Visit: Payer: Self-pay | Admitting: Neurology

## 2020-10-06 DIAGNOSIS — N186 End stage renal disease: Secondary | ICD-10-CM | POA: Diagnosis not present

## 2020-10-06 DIAGNOSIS — N2581 Secondary hyperparathyroidism of renal origin: Secondary | ICD-10-CM | POA: Diagnosis not present

## 2020-10-06 DIAGNOSIS — Z992 Dependence on renal dialysis: Secondary | ICD-10-CM | POA: Diagnosis not present

## 2020-10-08 DIAGNOSIS — N186 End stage renal disease: Secondary | ICD-10-CM | POA: Diagnosis not present

## 2020-10-08 DIAGNOSIS — Z992 Dependence on renal dialysis: Secondary | ICD-10-CM | POA: Diagnosis not present

## 2020-10-08 DIAGNOSIS — N2581 Secondary hyperparathyroidism of renal origin: Secondary | ICD-10-CM | POA: Diagnosis not present

## 2020-10-10 DIAGNOSIS — N186 End stage renal disease: Secondary | ICD-10-CM | POA: Diagnosis not present

## 2020-10-10 DIAGNOSIS — N2581 Secondary hyperparathyroidism of renal origin: Secondary | ICD-10-CM | POA: Diagnosis not present

## 2020-10-10 DIAGNOSIS — Z992 Dependence on renal dialysis: Secondary | ICD-10-CM | POA: Diagnosis not present

## 2020-10-13 DIAGNOSIS — N2581 Secondary hyperparathyroidism of renal origin: Secondary | ICD-10-CM | POA: Diagnosis not present

## 2020-10-13 DIAGNOSIS — Z992 Dependence on renal dialysis: Secondary | ICD-10-CM | POA: Diagnosis not present

## 2020-10-13 DIAGNOSIS — N186 End stage renal disease: Secondary | ICD-10-CM | POA: Diagnosis not present

## 2020-10-14 IMAGING — DX DG CHEST 1V PORT
1 series · 1 of 1 positions shown · non-contrast
Comparison: 07/30/2019

CLINICAL DATA: Fall

EXAM:
PORTABLE CHEST 1 VIEW

[chest]
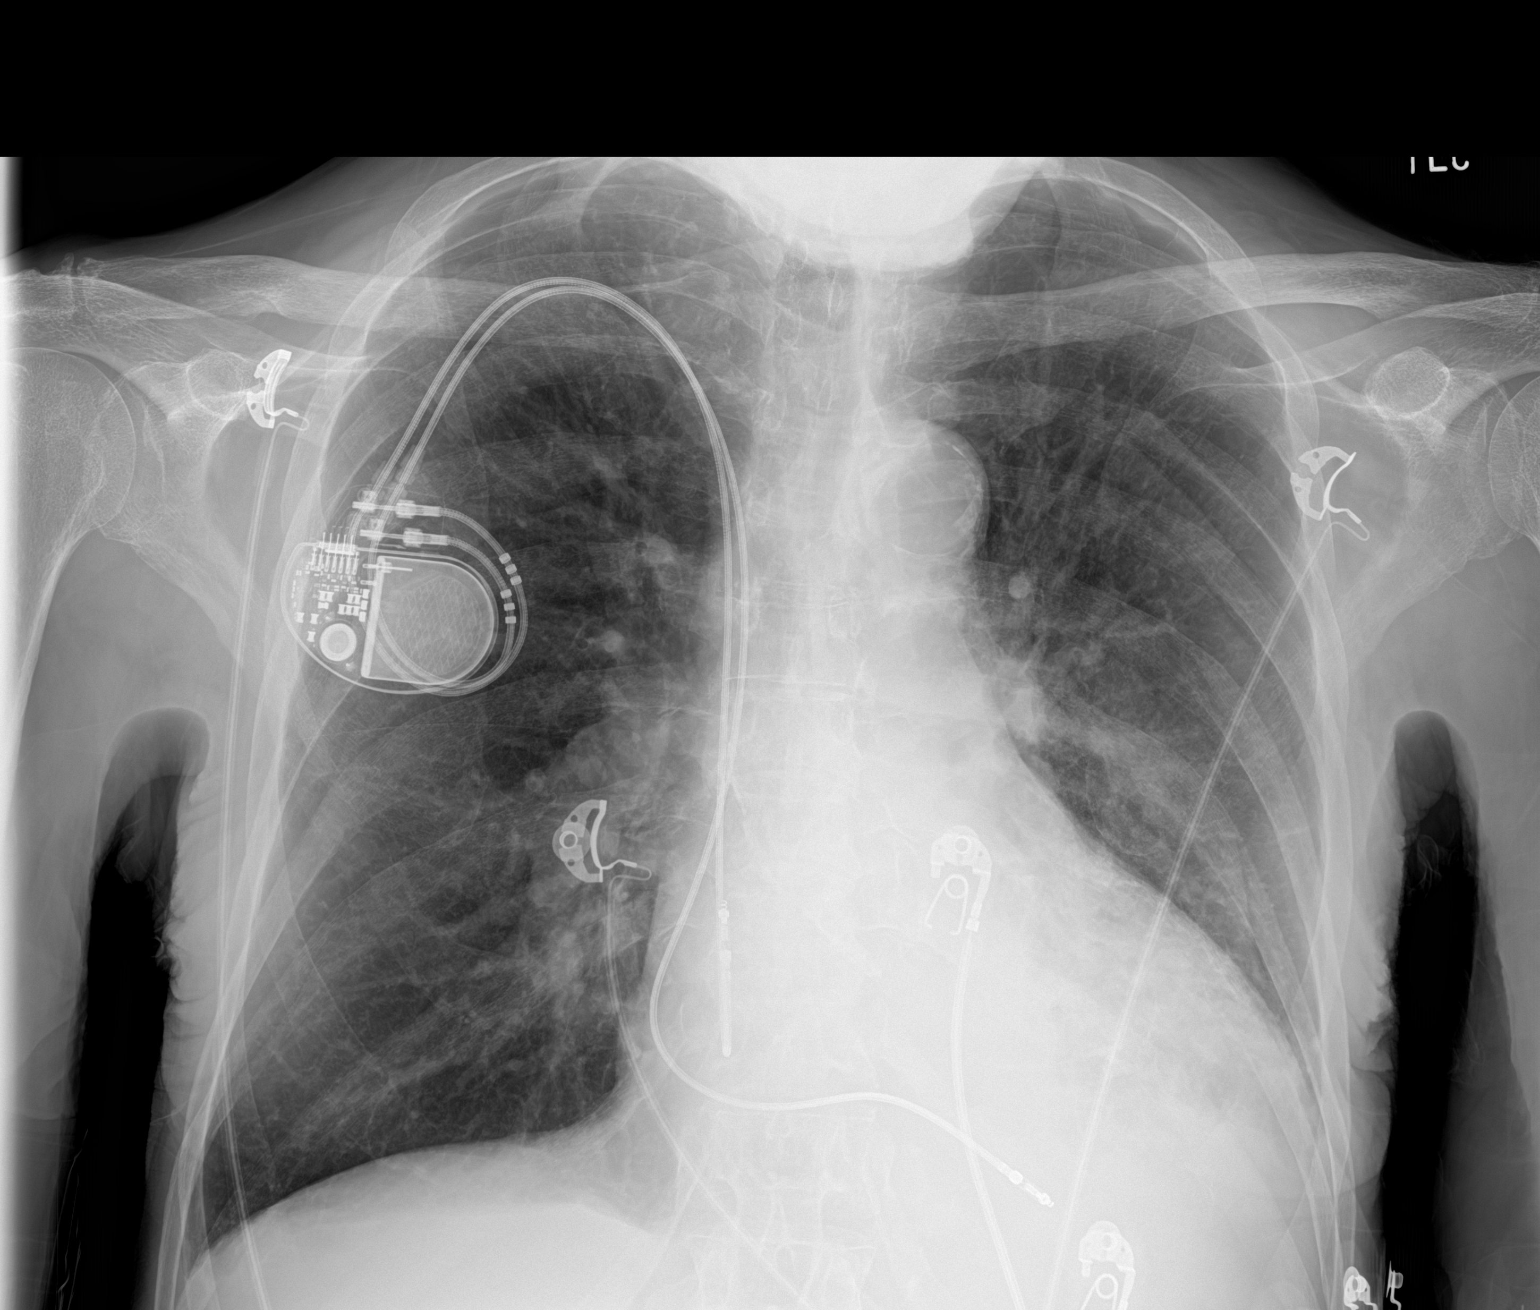

[1 of 1 positions shown; findings below may reference images not displayed]

FINDINGS: Progression of left lower lobe airspace disease. Possible
atelectasis or pneumonia.

Right lung is clear. Dual lead pacemaker unchanged in position.
Cardiac enlargement without heart failure or edema.

No acute skeletal abnormality.
IMPRESSION: Progression of left lower lobe atelectasis/infiltrate.

## 2020-10-14 IMAGING — CT CT CERVICAL SPINE W/O CM
3 series · 14 of 33 positions shown, 17 images · non-contrast
Comparison: CT head 07/25/2019

CLINICAL DATA: Fall. Head injury. On Eliquis. Dementia. Dialysis
patient.

EXAM:
CT HEAD WITHOUT CONTRAST
CT CERVICAL SPINE WITHOUT CONTRAST
TECHNIQUE: Multidetector CT imaging of the head and cervical spine was
performed following the standard protocol without intravenous
contrast. Multiplanar CT image reconstructions of the cervical spine
were also generated.

[Series 4: c_spine 2.0 (person_name) (person_name) · axial · 0.34mm/px · z∈[-246,-126]mm · 6 of 79 slices shown, 8 images]
[im 13/79  soft-tissue]
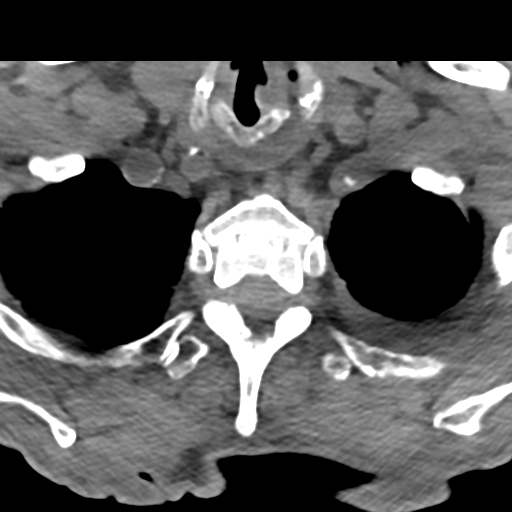
[im 13/79  bone]
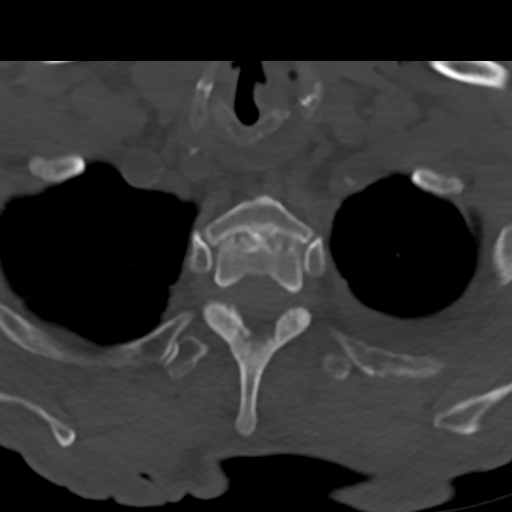
[im 25/79  bone]
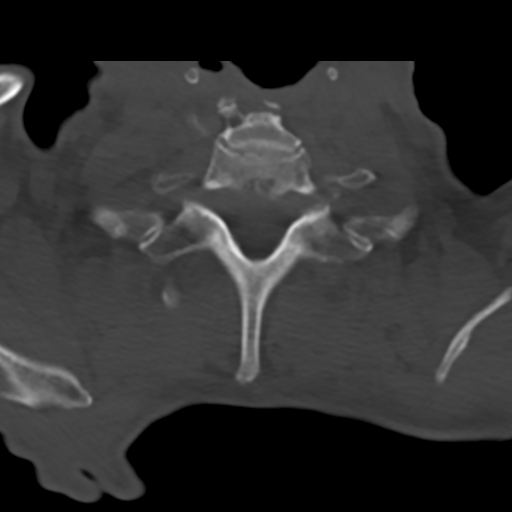
[im 37/79  bone]
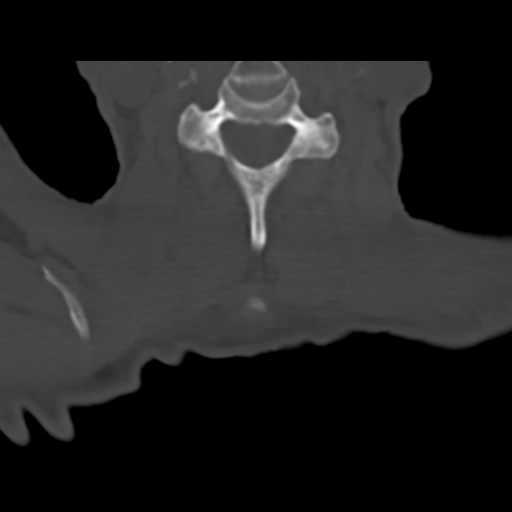
[im 49/79  bone]
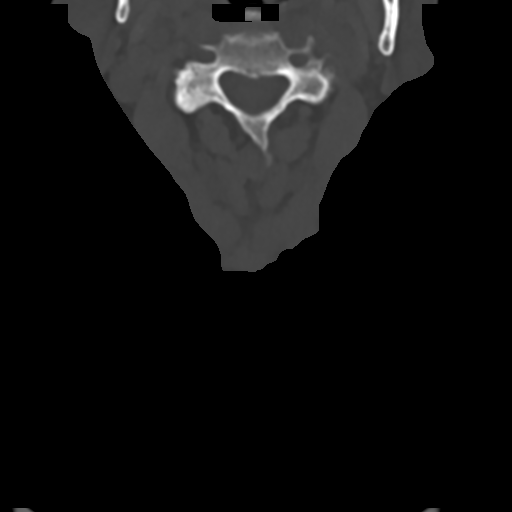
[im 61/79  soft-tissue]
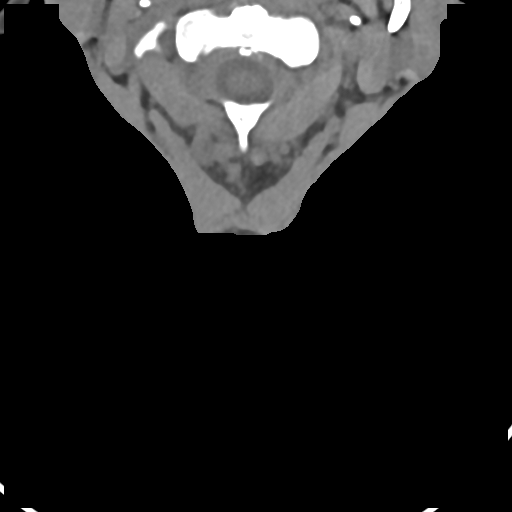
[im 61/79  bone]
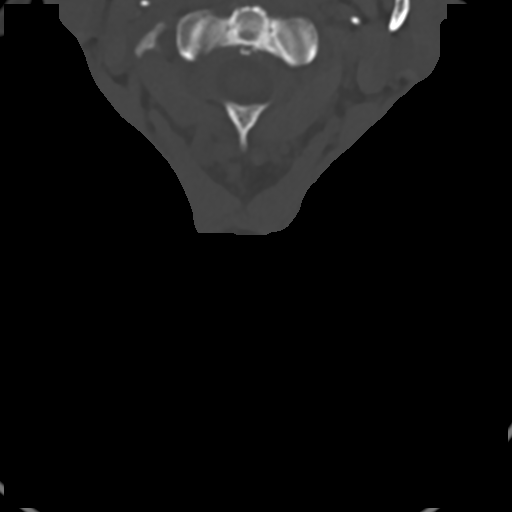
[im 73/79  bone]
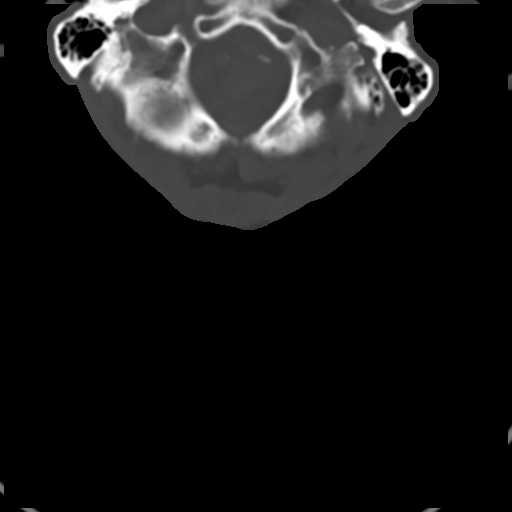

[Series 9: c_spine 2.0 sag bone · sagittal · 0.35mm/px · 5 of 66 slices shown, 6 images]
[im 22/66  bone]
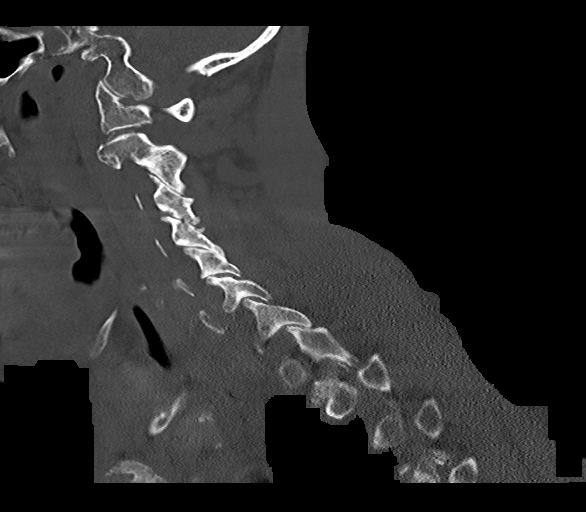
[im 28/66  bone]
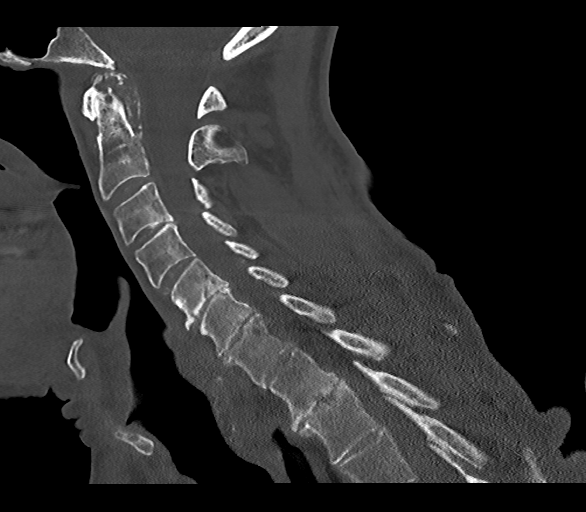
[im 33/66  soft-tissue]
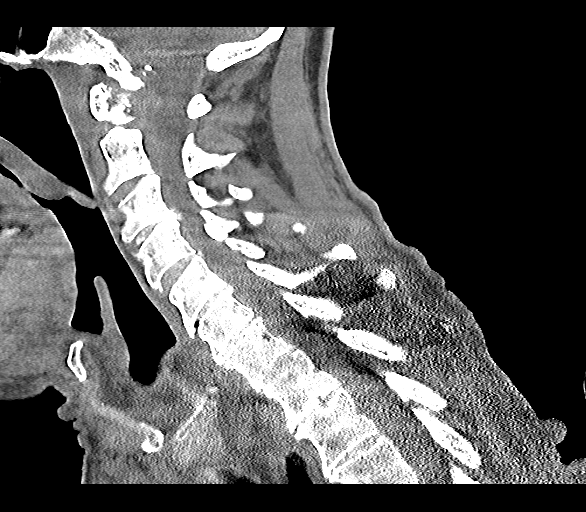
[im 33/66  bone]
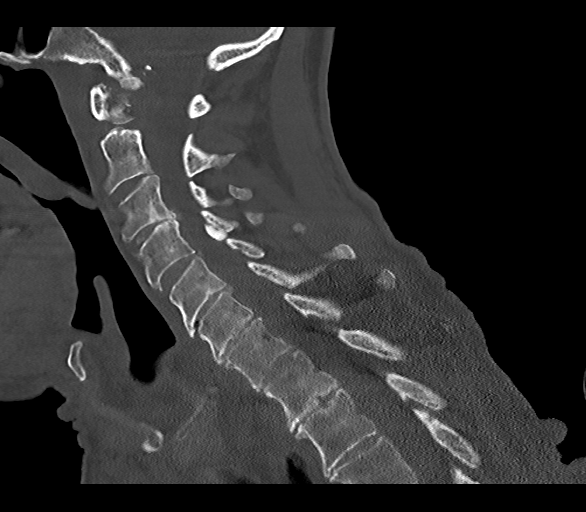
[im 38/66  bone]
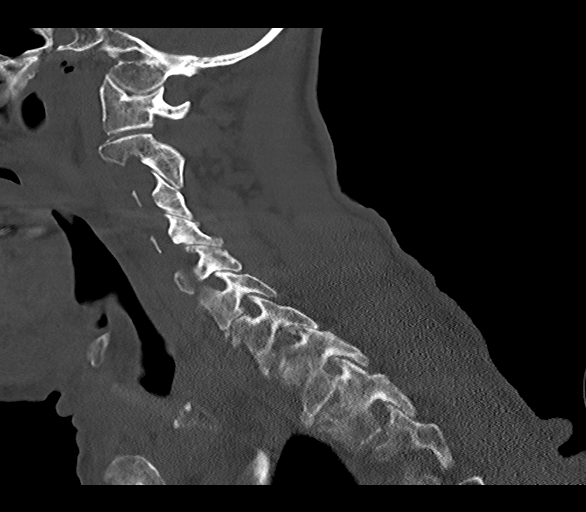
[im 44/66  bone]
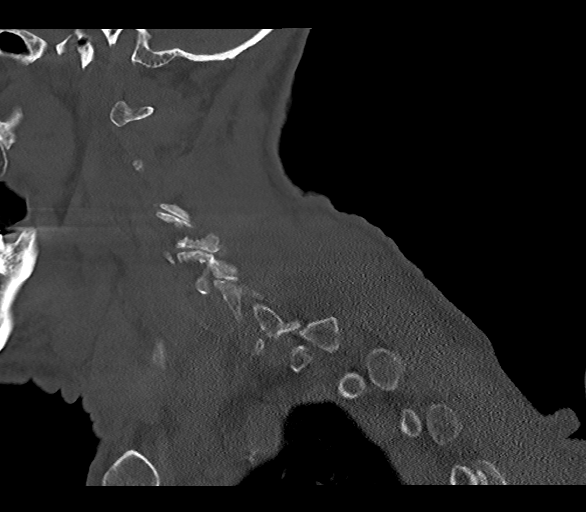

[Series 10: c_spine 2.0 cor bone · coronal · 0.28mm/px · 3 of 96 slices shown]
[im 20/96  bone]
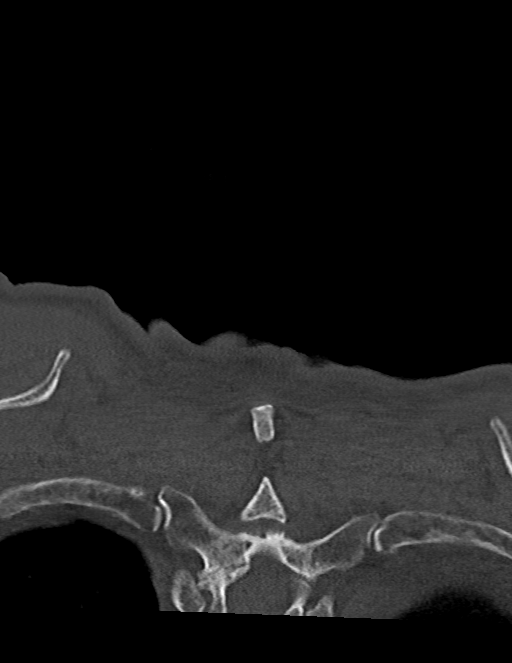
[im 39/96  bone]
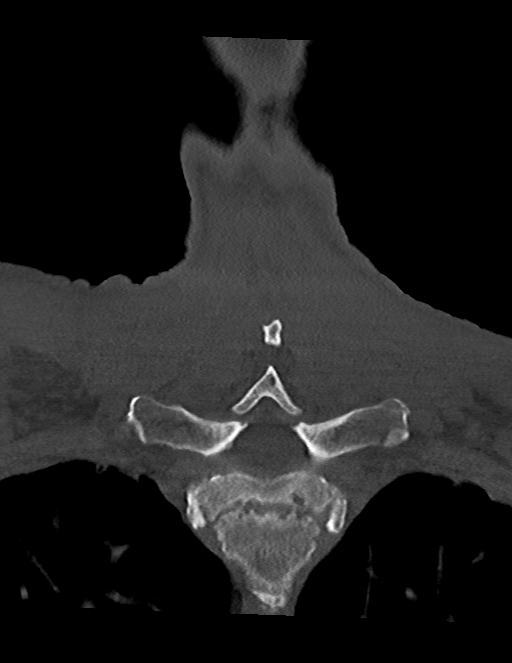
[im 58/96  bone]
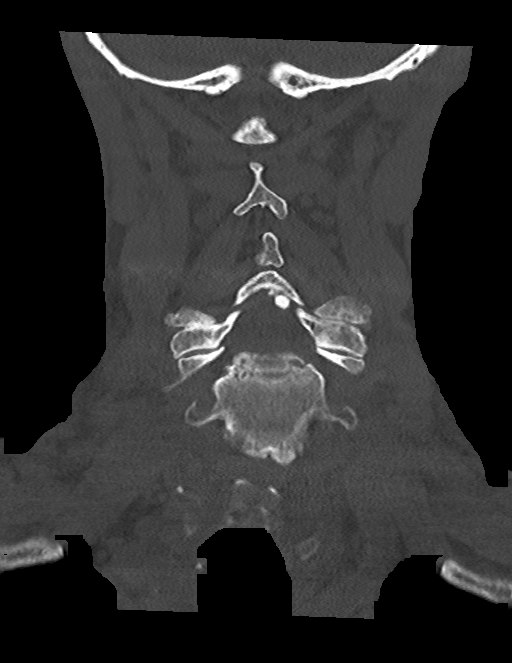

[14 of 33 positions shown; findings below may reference images not displayed]

FINDINGS: CT HEAD FINDINGS

Brain: Interval development of bilateral subdural hygromas. These
are slightly more dense than CSF and likely are due to acute injury.
No high-density hemorrhage identified.

Generalized atrophy. Moderate chronic microvascular ischemic change
in the white matter. Negative for acute infarct or mass. Ventricle
size not enlarged.

Vascular: Negative for hyperdense vessel

Skull: Negative

Sinuses/Orbits: Paranasal sinuses clear. No orbital lesion.
Proptosis right greater than left.

Other: None

CT CERVICAL SPINE FINDINGS

Alignment: Normal

Skull base and vertebrae: Negative for fracture

Soft tissues and spinal canal: Atherosclerotic calcification. No
soft tissue swelling or mass.

Disc levels: Multilevel disc and facet degeneration. Mild foraminal
narrowing bilaterally at C3-4. Moderate left foraminal narrowing at
C4-5. Mild foraminal narrowing at C5-6 and C6-7

Upper chest: Small left effusion.

Other: None
IMPRESSION: 1. Interval development of bilateral subdural hygromas measuring
approximately 6 mm. These are likely due to acute injury. No high
density blood products identified. No midline shift.
2. Atrophy and chronic microvascular ischemia
3. Cervical spondylosis.  Negative for fracture.

## 2020-10-15 DIAGNOSIS — Z992 Dependence on renal dialysis: Secondary | ICD-10-CM | POA: Diagnosis not present

## 2020-10-15 DIAGNOSIS — N2581 Secondary hyperparathyroidism of renal origin: Secondary | ICD-10-CM | POA: Diagnosis not present

## 2020-10-15 DIAGNOSIS — N186 End stage renal disease: Secondary | ICD-10-CM | POA: Diagnosis not present

## 2020-10-17 ENCOUNTER — Other Ambulatory Visit (HOSPITAL_COMMUNITY): Payer: Self-pay | Admitting: Cardiology

## 2020-10-17 ENCOUNTER — Other Ambulatory Visit: Payer: Self-pay | Admitting: Neurology

## 2020-10-17 ENCOUNTER — Other Ambulatory Visit: Payer: Self-pay | Admitting: Cardiology

## 2020-10-17 DIAGNOSIS — N186 End stage renal disease: Secondary | ICD-10-CM | POA: Diagnosis not present

## 2020-10-17 DIAGNOSIS — N2581 Secondary hyperparathyroidism of renal origin: Secondary | ICD-10-CM | POA: Diagnosis not present

## 2020-10-17 DIAGNOSIS — Z992 Dependence on renal dialysis: Secondary | ICD-10-CM | POA: Diagnosis not present

## 2020-10-20 DIAGNOSIS — Z992 Dependence on renal dialysis: Secondary | ICD-10-CM | POA: Diagnosis not present

## 2020-10-20 DIAGNOSIS — N186 End stage renal disease: Secondary | ICD-10-CM | POA: Diagnosis not present

## 2020-10-20 DIAGNOSIS — N2581 Secondary hyperparathyroidism of renal origin: Secondary | ICD-10-CM | POA: Diagnosis not present

## 2020-10-22 DIAGNOSIS — N2581 Secondary hyperparathyroidism of renal origin: Secondary | ICD-10-CM | POA: Diagnosis not present

## 2020-10-22 DIAGNOSIS — N186 End stage renal disease: Secondary | ICD-10-CM | POA: Diagnosis not present

## 2020-10-22 DIAGNOSIS — Z992 Dependence on renal dialysis: Secondary | ICD-10-CM | POA: Diagnosis not present

## 2020-10-24 DIAGNOSIS — N186 End stage renal disease: Secondary | ICD-10-CM | POA: Diagnosis not present

## 2020-10-24 DIAGNOSIS — Z992 Dependence on renal dialysis: Secondary | ICD-10-CM | POA: Diagnosis not present

## 2020-10-24 DIAGNOSIS — N2581 Secondary hyperparathyroidism of renal origin: Secondary | ICD-10-CM | POA: Diagnosis not present

## 2020-10-27 DIAGNOSIS — N269 Renal sclerosis, unspecified: Secondary | ICD-10-CM | POA: Diagnosis not present

## 2020-10-27 DIAGNOSIS — D631 Anemia in chronic kidney disease: Secondary | ICD-10-CM | POA: Diagnosis not present

## 2020-10-27 DIAGNOSIS — N186 End stage renal disease: Secondary | ICD-10-CM | POA: Diagnosis not present

## 2020-10-27 DIAGNOSIS — Z992 Dependence on renal dialysis: Secondary | ICD-10-CM | POA: Diagnosis not present

## 2020-10-27 DIAGNOSIS — N2581 Secondary hyperparathyroidism of renal origin: Secondary | ICD-10-CM | POA: Diagnosis not present

## 2020-10-29 DIAGNOSIS — D631 Anemia in chronic kidney disease: Secondary | ICD-10-CM | POA: Diagnosis not present

## 2020-10-29 DIAGNOSIS — N186 End stage renal disease: Secondary | ICD-10-CM | POA: Diagnosis not present

## 2020-10-29 DIAGNOSIS — N2581 Secondary hyperparathyroidism of renal origin: Secondary | ICD-10-CM | POA: Diagnosis not present

## 2020-10-29 DIAGNOSIS — Z992 Dependence on renal dialysis: Secondary | ICD-10-CM | POA: Diagnosis not present

## 2020-10-31 DIAGNOSIS — N2581 Secondary hyperparathyroidism of renal origin: Secondary | ICD-10-CM | POA: Diagnosis not present

## 2020-10-31 DIAGNOSIS — D631 Anemia in chronic kidney disease: Secondary | ICD-10-CM | POA: Diagnosis not present

## 2020-10-31 DIAGNOSIS — Z992 Dependence on renal dialysis: Secondary | ICD-10-CM | POA: Diagnosis not present

## 2020-10-31 DIAGNOSIS — N186 End stage renal disease: Secondary | ICD-10-CM | POA: Diagnosis not present

## 2020-11-03 DIAGNOSIS — Z992 Dependence on renal dialysis: Secondary | ICD-10-CM | POA: Diagnosis not present

## 2020-11-03 DIAGNOSIS — N2581 Secondary hyperparathyroidism of renal origin: Secondary | ICD-10-CM | POA: Diagnosis not present

## 2020-11-03 DIAGNOSIS — N186 End stage renal disease: Secondary | ICD-10-CM | POA: Diagnosis not present

## 2020-11-03 DIAGNOSIS — D631 Anemia in chronic kidney disease: Secondary | ICD-10-CM | POA: Diagnosis not present

## 2020-11-05 DIAGNOSIS — N2581 Secondary hyperparathyroidism of renal origin: Secondary | ICD-10-CM | POA: Diagnosis not present

## 2020-11-05 DIAGNOSIS — D631 Anemia in chronic kidney disease: Secondary | ICD-10-CM | POA: Diagnosis not present

## 2020-11-05 DIAGNOSIS — Z992 Dependence on renal dialysis: Secondary | ICD-10-CM | POA: Diagnosis not present

## 2020-11-05 DIAGNOSIS — N186 End stage renal disease: Secondary | ICD-10-CM | POA: Diagnosis not present

## 2020-11-07 DIAGNOSIS — D631 Anemia in chronic kidney disease: Secondary | ICD-10-CM | POA: Diagnosis not present

## 2020-11-07 DIAGNOSIS — Z992 Dependence on renal dialysis: Secondary | ICD-10-CM | POA: Diagnosis not present

## 2020-11-07 DIAGNOSIS — N2581 Secondary hyperparathyroidism of renal origin: Secondary | ICD-10-CM | POA: Diagnosis not present

## 2020-11-07 DIAGNOSIS — N186 End stage renal disease: Secondary | ICD-10-CM | POA: Diagnosis not present

## 2020-11-10 DIAGNOSIS — N186 End stage renal disease: Secondary | ICD-10-CM | POA: Diagnosis not present

## 2020-11-10 DIAGNOSIS — D631 Anemia in chronic kidney disease: Secondary | ICD-10-CM | POA: Diagnosis not present

## 2020-11-10 DIAGNOSIS — Z992 Dependence on renal dialysis: Secondary | ICD-10-CM | POA: Diagnosis not present

## 2020-11-10 DIAGNOSIS — N2581 Secondary hyperparathyroidism of renal origin: Secondary | ICD-10-CM | POA: Diagnosis not present

## 2020-11-14 ENCOUNTER — Ambulatory Visit (INDEPENDENT_AMBULATORY_CARE_PROVIDER_SITE_OTHER): Payer: Medicare Other

## 2020-11-14 DIAGNOSIS — I442 Atrioventricular block, complete: Secondary | ICD-10-CM | POA: Diagnosis not present

## 2020-11-14 DIAGNOSIS — N2581 Secondary hyperparathyroidism of renal origin: Secondary | ICD-10-CM | POA: Diagnosis not present

## 2020-11-14 DIAGNOSIS — Z992 Dependence on renal dialysis: Secondary | ICD-10-CM | POA: Diagnosis not present

## 2020-11-14 DIAGNOSIS — D631 Anemia in chronic kidney disease: Secondary | ICD-10-CM | POA: Diagnosis not present

## 2020-11-14 DIAGNOSIS — N186 End stage renal disease: Secondary | ICD-10-CM | POA: Diagnosis not present

## 2020-11-14 LAB — CUP PACEART REMOTE DEVICE CHECK
Battery Remaining Longevity: 98 mo
Battery Remaining Percentage: 82 %
Battery Voltage: 3.01 V
Brady Statistic AP VP Percent: 89 %
Brady Statistic AP VS Percent: 1 %
Brady Statistic AS VP Percent: 8.7 %
Brady Statistic AS VS Percent: 2.1 %
Brady Statistic RA Percent Paced: 12 %
Brady Statistic RV Percent Paced: 99 %
Date Time Interrogation Session: 20220819020017
Implantable Lead Implant Date: 20201113
Implantable Lead Implant Date: 20201113
Implantable Lead Location: 753859
Implantable Lead Location: 753860
Implantable Pulse Generator Implant Date: 20201113
Lead Channel Impedance Value: 400 Ohm
Lead Channel Impedance Value: 410 Ohm
Lead Channel Pacing Threshold Amplitude: 0.625 V
Lead Channel Pacing Threshold Amplitude: 1 V
Lead Channel Pacing Threshold Pulse Width: 0.4 ms
Lead Channel Pacing Threshold Pulse Width: 0.4 ms
Lead Channel Sensing Intrinsic Amplitude: 5 mV
Lead Channel Sensing Intrinsic Amplitude: 9.7 mV
Lead Channel Setting Pacing Amplitude: 1.25 V
Lead Channel Setting Pacing Amplitude: 1.625
Lead Channel Setting Pacing Pulse Width: 0.4 ms
Lead Channel Setting Sensing Sensitivity: 2 mV
Pulse Gen Model: 2272
Pulse Gen Serial Number: 9178446

## 2020-11-17 DIAGNOSIS — D631 Anemia in chronic kidney disease: Secondary | ICD-10-CM | POA: Diagnosis not present

## 2020-11-17 DIAGNOSIS — Z992 Dependence on renal dialysis: Secondary | ICD-10-CM | POA: Diagnosis not present

## 2020-11-17 DIAGNOSIS — N186 End stage renal disease: Secondary | ICD-10-CM | POA: Diagnosis not present

## 2020-11-17 DIAGNOSIS — N2581 Secondary hyperparathyroidism of renal origin: Secondary | ICD-10-CM | POA: Diagnosis not present

## 2020-11-19 DIAGNOSIS — N2581 Secondary hyperparathyroidism of renal origin: Secondary | ICD-10-CM | POA: Diagnosis not present

## 2020-11-19 DIAGNOSIS — Z992 Dependence on renal dialysis: Secondary | ICD-10-CM | POA: Diagnosis not present

## 2020-11-19 DIAGNOSIS — D631 Anemia in chronic kidney disease: Secondary | ICD-10-CM | POA: Diagnosis not present

## 2020-11-19 DIAGNOSIS — N186 End stage renal disease: Secondary | ICD-10-CM | POA: Diagnosis not present

## 2020-11-21 DIAGNOSIS — D631 Anemia in chronic kidney disease: Secondary | ICD-10-CM | POA: Diagnosis not present

## 2020-11-21 DIAGNOSIS — N2581 Secondary hyperparathyroidism of renal origin: Secondary | ICD-10-CM | POA: Diagnosis not present

## 2020-11-21 DIAGNOSIS — Z992 Dependence on renal dialysis: Secondary | ICD-10-CM | POA: Diagnosis not present

## 2020-11-21 DIAGNOSIS — N186 End stage renal disease: Secondary | ICD-10-CM | POA: Diagnosis not present

## 2020-11-24 DIAGNOSIS — N2581 Secondary hyperparathyroidism of renal origin: Secondary | ICD-10-CM | POA: Diagnosis not present

## 2020-11-24 DIAGNOSIS — Z992 Dependence on renal dialysis: Secondary | ICD-10-CM | POA: Diagnosis not present

## 2020-11-24 DIAGNOSIS — N186 End stage renal disease: Secondary | ICD-10-CM | POA: Diagnosis not present

## 2020-11-24 DIAGNOSIS — D631 Anemia in chronic kidney disease: Secondary | ICD-10-CM | POA: Diagnosis not present

## 2020-11-26 DIAGNOSIS — D631 Anemia in chronic kidney disease: Secondary | ICD-10-CM | POA: Diagnosis not present

## 2020-11-26 DIAGNOSIS — N186 End stage renal disease: Secondary | ICD-10-CM | POA: Diagnosis not present

## 2020-11-26 DIAGNOSIS — Z992 Dependence on renal dialysis: Secondary | ICD-10-CM | POA: Diagnosis not present

## 2020-11-26 DIAGNOSIS — N2581 Secondary hyperparathyroidism of renal origin: Secondary | ICD-10-CM | POA: Diagnosis not present

## 2020-11-27 DIAGNOSIS — Z992 Dependence on renal dialysis: Secondary | ICD-10-CM | POA: Diagnosis not present

## 2020-11-27 DIAGNOSIS — N269 Renal sclerosis, unspecified: Secondary | ICD-10-CM | POA: Diagnosis not present

## 2020-11-27 DIAGNOSIS — N186 End stage renal disease: Secondary | ICD-10-CM | POA: Diagnosis not present

## 2020-11-28 DIAGNOSIS — D631 Anemia in chronic kidney disease: Secondary | ICD-10-CM | POA: Diagnosis not present

## 2020-11-28 DIAGNOSIS — N186 End stage renal disease: Secondary | ICD-10-CM | POA: Diagnosis not present

## 2020-11-28 DIAGNOSIS — N2581 Secondary hyperparathyroidism of renal origin: Secondary | ICD-10-CM | POA: Diagnosis not present

## 2020-11-28 DIAGNOSIS — Z992 Dependence on renal dialysis: Secondary | ICD-10-CM | POA: Diagnosis not present

## 2020-11-29 NOTE — Progress Notes (Signed)
Remote pacemaker transmission.   

## 2020-12-01 DIAGNOSIS — N2581 Secondary hyperparathyroidism of renal origin: Secondary | ICD-10-CM | POA: Diagnosis not present

## 2020-12-01 DIAGNOSIS — D631 Anemia in chronic kidney disease: Secondary | ICD-10-CM | POA: Diagnosis not present

## 2020-12-01 DIAGNOSIS — Z992 Dependence on renal dialysis: Secondary | ICD-10-CM | POA: Diagnosis not present

## 2020-12-01 DIAGNOSIS — N186 End stage renal disease: Secondary | ICD-10-CM | POA: Diagnosis not present

## 2020-12-03 DIAGNOSIS — N2581 Secondary hyperparathyroidism of renal origin: Secondary | ICD-10-CM | POA: Diagnosis not present

## 2020-12-03 DIAGNOSIS — N186 End stage renal disease: Secondary | ICD-10-CM | POA: Diagnosis not present

## 2020-12-03 DIAGNOSIS — D631 Anemia in chronic kidney disease: Secondary | ICD-10-CM | POA: Diagnosis not present

## 2020-12-03 DIAGNOSIS — Z992 Dependence on renal dialysis: Secondary | ICD-10-CM | POA: Diagnosis not present

## 2020-12-05 DIAGNOSIS — N186 End stage renal disease: Secondary | ICD-10-CM | POA: Diagnosis not present

## 2020-12-05 DIAGNOSIS — N2581 Secondary hyperparathyroidism of renal origin: Secondary | ICD-10-CM | POA: Diagnosis not present

## 2020-12-05 DIAGNOSIS — Z992 Dependence on renal dialysis: Secondary | ICD-10-CM | POA: Diagnosis not present

## 2020-12-05 DIAGNOSIS — D631 Anemia in chronic kidney disease: Secondary | ICD-10-CM | POA: Diagnosis not present

## 2020-12-08 DIAGNOSIS — N2581 Secondary hyperparathyroidism of renal origin: Secondary | ICD-10-CM | POA: Diagnosis not present

## 2020-12-08 DIAGNOSIS — D631 Anemia in chronic kidney disease: Secondary | ICD-10-CM | POA: Diagnosis not present

## 2020-12-08 DIAGNOSIS — N186 End stage renal disease: Secondary | ICD-10-CM | POA: Diagnosis not present

## 2020-12-08 DIAGNOSIS — Z992 Dependence on renal dialysis: Secondary | ICD-10-CM | POA: Diagnosis not present

## 2020-12-10 ENCOUNTER — Other Ambulatory Visit: Payer: Self-pay | Admitting: Neurology

## 2020-12-10 DIAGNOSIS — Z992 Dependence on renal dialysis: Secondary | ICD-10-CM | POA: Diagnosis not present

## 2020-12-10 DIAGNOSIS — N2581 Secondary hyperparathyroidism of renal origin: Secondary | ICD-10-CM | POA: Diagnosis not present

## 2020-12-10 DIAGNOSIS — D631 Anemia in chronic kidney disease: Secondary | ICD-10-CM | POA: Diagnosis not present

## 2020-12-10 DIAGNOSIS — N186 End stage renal disease: Secondary | ICD-10-CM | POA: Diagnosis not present

## 2020-12-12 DIAGNOSIS — N2581 Secondary hyperparathyroidism of renal origin: Secondary | ICD-10-CM | POA: Diagnosis not present

## 2020-12-12 DIAGNOSIS — D631 Anemia in chronic kidney disease: Secondary | ICD-10-CM | POA: Diagnosis not present

## 2020-12-12 DIAGNOSIS — Z992 Dependence on renal dialysis: Secondary | ICD-10-CM | POA: Diagnosis not present

## 2020-12-12 DIAGNOSIS — N186 End stage renal disease: Secondary | ICD-10-CM | POA: Diagnosis not present

## 2020-12-15 DIAGNOSIS — N2581 Secondary hyperparathyroidism of renal origin: Secondary | ICD-10-CM | POA: Diagnosis not present

## 2020-12-15 DIAGNOSIS — Z992 Dependence on renal dialysis: Secondary | ICD-10-CM | POA: Diagnosis not present

## 2020-12-15 DIAGNOSIS — D631 Anemia in chronic kidney disease: Secondary | ICD-10-CM | POA: Diagnosis not present

## 2020-12-15 DIAGNOSIS — N186 End stage renal disease: Secondary | ICD-10-CM | POA: Diagnosis not present

## 2020-12-17 DIAGNOSIS — D631 Anemia in chronic kidney disease: Secondary | ICD-10-CM | POA: Diagnosis not present

## 2020-12-17 DIAGNOSIS — N186 End stage renal disease: Secondary | ICD-10-CM | POA: Diagnosis not present

## 2020-12-17 DIAGNOSIS — N2581 Secondary hyperparathyroidism of renal origin: Secondary | ICD-10-CM | POA: Diagnosis not present

## 2020-12-17 DIAGNOSIS — Z992 Dependence on renal dialysis: Secondary | ICD-10-CM | POA: Diagnosis not present

## 2020-12-18 DIAGNOSIS — H401124 Primary open-angle glaucoma, left eye, indeterminate stage: Secondary | ICD-10-CM | POA: Diagnosis not present

## 2020-12-18 DIAGNOSIS — H401113 Primary open-angle glaucoma, right eye, severe stage: Secondary | ICD-10-CM | POA: Diagnosis not present

## 2020-12-19 DIAGNOSIS — N186 End stage renal disease: Secondary | ICD-10-CM | POA: Diagnosis not present

## 2020-12-19 DIAGNOSIS — D631 Anemia in chronic kidney disease: Secondary | ICD-10-CM | POA: Diagnosis not present

## 2020-12-19 DIAGNOSIS — N2581 Secondary hyperparathyroidism of renal origin: Secondary | ICD-10-CM | POA: Diagnosis not present

## 2020-12-19 DIAGNOSIS — Z992 Dependence on renal dialysis: Secondary | ICD-10-CM | POA: Diagnosis not present

## 2020-12-22 DIAGNOSIS — N2581 Secondary hyperparathyroidism of renal origin: Secondary | ICD-10-CM | POA: Diagnosis not present

## 2020-12-22 DIAGNOSIS — Z992 Dependence on renal dialysis: Secondary | ICD-10-CM | POA: Diagnosis not present

## 2020-12-22 DIAGNOSIS — N186 End stage renal disease: Secondary | ICD-10-CM | POA: Diagnosis not present

## 2020-12-22 DIAGNOSIS — D631 Anemia in chronic kidney disease: Secondary | ICD-10-CM | POA: Diagnosis not present

## 2020-12-24 DIAGNOSIS — N186 End stage renal disease: Secondary | ICD-10-CM | POA: Diagnosis not present

## 2020-12-24 DIAGNOSIS — N2581 Secondary hyperparathyroidism of renal origin: Secondary | ICD-10-CM | POA: Diagnosis not present

## 2020-12-24 DIAGNOSIS — D631 Anemia in chronic kidney disease: Secondary | ICD-10-CM | POA: Diagnosis not present

## 2020-12-24 DIAGNOSIS — Z992 Dependence on renal dialysis: Secondary | ICD-10-CM | POA: Diagnosis not present

## 2020-12-25 DIAGNOSIS — H01001 Unspecified blepharitis right upper eyelid: Secondary | ICD-10-CM | POA: Diagnosis not present

## 2020-12-26 DIAGNOSIS — N2581 Secondary hyperparathyroidism of renal origin: Secondary | ICD-10-CM | POA: Diagnosis not present

## 2020-12-26 DIAGNOSIS — Z992 Dependence on renal dialysis: Secondary | ICD-10-CM | POA: Diagnosis not present

## 2020-12-26 DIAGNOSIS — D631 Anemia in chronic kidney disease: Secondary | ICD-10-CM | POA: Diagnosis not present

## 2020-12-26 DIAGNOSIS — N186 End stage renal disease: Secondary | ICD-10-CM | POA: Diagnosis not present

## 2020-12-27 DIAGNOSIS — Z992 Dependence on renal dialysis: Secondary | ICD-10-CM | POA: Diagnosis not present

## 2020-12-27 DIAGNOSIS — N186 End stage renal disease: Secondary | ICD-10-CM | POA: Diagnosis not present

## 2020-12-27 DIAGNOSIS — N269 Renal sclerosis, unspecified: Secondary | ICD-10-CM | POA: Diagnosis not present

## 2020-12-29 DIAGNOSIS — N186 End stage renal disease: Secondary | ICD-10-CM | POA: Diagnosis not present

## 2020-12-29 DIAGNOSIS — D631 Anemia in chronic kidney disease: Secondary | ICD-10-CM | POA: Diagnosis not present

## 2020-12-29 DIAGNOSIS — Z23 Encounter for immunization: Secondary | ICD-10-CM | POA: Diagnosis not present

## 2020-12-29 DIAGNOSIS — D509 Iron deficiency anemia, unspecified: Secondary | ICD-10-CM | POA: Diagnosis not present

## 2020-12-29 DIAGNOSIS — N2581 Secondary hyperparathyroidism of renal origin: Secondary | ICD-10-CM | POA: Diagnosis not present

## 2020-12-29 DIAGNOSIS — Z992 Dependence on renal dialysis: Secondary | ICD-10-CM | POA: Diagnosis not present

## 2020-12-31 DIAGNOSIS — Z992 Dependence on renal dialysis: Secondary | ICD-10-CM | POA: Diagnosis not present

## 2020-12-31 DIAGNOSIS — N2581 Secondary hyperparathyroidism of renal origin: Secondary | ICD-10-CM | POA: Diagnosis not present

## 2020-12-31 DIAGNOSIS — D631 Anemia in chronic kidney disease: Secondary | ICD-10-CM | POA: Diagnosis not present

## 2020-12-31 DIAGNOSIS — N186 End stage renal disease: Secondary | ICD-10-CM | POA: Diagnosis not present

## 2020-12-31 DIAGNOSIS — Z23 Encounter for immunization: Secondary | ICD-10-CM | POA: Diagnosis not present

## 2020-12-31 DIAGNOSIS — D509 Iron deficiency anemia, unspecified: Secondary | ICD-10-CM | POA: Diagnosis not present

## 2021-01-02 DIAGNOSIS — Z23 Encounter for immunization: Secondary | ICD-10-CM | POA: Diagnosis not present

## 2021-01-02 DIAGNOSIS — Z992 Dependence on renal dialysis: Secondary | ICD-10-CM | POA: Diagnosis not present

## 2021-01-02 DIAGNOSIS — D509 Iron deficiency anemia, unspecified: Secondary | ICD-10-CM | POA: Diagnosis not present

## 2021-01-02 DIAGNOSIS — N186 End stage renal disease: Secondary | ICD-10-CM | POA: Diagnosis not present

## 2021-01-02 DIAGNOSIS — N2581 Secondary hyperparathyroidism of renal origin: Secondary | ICD-10-CM | POA: Diagnosis not present

## 2021-01-02 DIAGNOSIS — D631 Anemia in chronic kidney disease: Secondary | ICD-10-CM | POA: Diagnosis not present

## 2021-01-03 ENCOUNTER — Other Ambulatory Visit: Payer: Self-pay | Admitting: Neurology

## 2021-01-05 DIAGNOSIS — N2581 Secondary hyperparathyroidism of renal origin: Secondary | ICD-10-CM | POA: Diagnosis not present

## 2021-01-05 DIAGNOSIS — N186 End stage renal disease: Secondary | ICD-10-CM | POA: Diagnosis not present

## 2021-01-05 DIAGNOSIS — Z23 Encounter for immunization: Secondary | ICD-10-CM | POA: Diagnosis not present

## 2021-01-05 DIAGNOSIS — D631 Anemia in chronic kidney disease: Secondary | ICD-10-CM | POA: Diagnosis not present

## 2021-01-05 DIAGNOSIS — D509 Iron deficiency anemia, unspecified: Secondary | ICD-10-CM | POA: Diagnosis not present

## 2021-01-05 DIAGNOSIS — Z992 Dependence on renal dialysis: Secondary | ICD-10-CM | POA: Diagnosis not present

## 2021-01-07 DIAGNOSIS — D631 Anemia in chronic kidney disease: Secondary | ICD-10-CM | POA: Diagnosis not present

## 2021-01-07 DIAGNOSIS — Z23 Encounter for immunization: Secondary | ICD-10-CM | POA: Diagnosis not present

## 2021-01-07 DIAGNOSIS — Z992 Dependence on renal dialysis: Secondary | ICD-10-CM | POA: Diagnosis not present

## 2021-01-07 DIAGNOSIS — N2581 Secondary hyperparathyroidism of renal origin: Secondary | ICD-10-CM | POA: Diagnosis not present

## 2021-01-07 DIAGNOSIS — D509 Iron deficiency anemia, unspecified: Secondary | ICD-10-CM | POA: Diagnosis not present

## 2021-01-07 DIAGNOSIS — N186 End stage renal disease: Secondary | ICD-10-CM | POA: Diagnosis not present

## 2021-01-09 DIAGNOSIS — Z23 Encounter for immunization: Secondary | ICD-10-CM | POA: Diagnosis not present

## 2021-01-09 DIAGNOSIS — N186 End stage renal disease: Secondary | ICD-10-CM | POA: Diagnosis not present

## 2021-01-09 DIAGNOSIS — N2581 Secondary hyperparathyroidism of renal origin: Secondary | ICD-10-CM | POA: Diagnosis not present

## 2021-01-09 DIAGNOSIS — Z992 Dependence on renal dialysis: Secondary | ICD-10-CM | POA: Diagnosis not present

## 2021-01-09 DIAGNOSIS — D509 Iron deficiency anemia, unspecified: Secondary | ICD-10-CM | POA: Diagnosis not present

## 2021-01-09 DIAGNOSIS — D631 Anemia in chronic kidney disease: Secondary | ICD-10-CM | POA: Diagnosis not present

## 2021-01-12 DIAGNOSIS — N186 End stage renal disease: Secondary | ICD-10-CM | POA: Diagnosis not present

## 2021-01-12 DIAGNOSIS — Z23 Encounter for immunization: Secondary | ICD-10-CM | POA: Diagnosis not present

## 2021-01-12 DIAGNOSIS — N2581 Secondary hyperparathyroidism of renal origin: Secondary | ICD-10-CM | POA: Diagnosis not present

## 2021-01-12 DIAGNOSIS — Z992 Dependence on renal dialysis: Secondary | ICD-10-CM | POA: Diagnosis not present

## 2021-01-12 DIAGNOSIS — D631 Anemia in chronic kidney disease: Secondary | ICD-10-CM | POA: Diagnosis not present

## 2021-01-12 DIAGNOSIS — D509 Iron deficiency anemia, unspecified: Secondary | ICD-10-CM | POA: Diagnosis not present

## 2021-01-14 DIAGNOSIS — Z992 Dependence on renal dialysis: Secondary | ICD-10-CM | POA: Diagnosis not present

## 2021-01-14 DIAGNOSIS — D631 Anemia in chronic kidney disease: Secondary | ICD-10-CM | POA: Diagnosis not present

## 2021-01-14 DIAGNOSIS — N2581 Secondary hyperparathyroidism of renal origin: Secondary | ICD-10-CM | POA: Diagnosis not present

## 2021-01-14 DIAGNOSIS — D509 Iron deficiency anemia, unspecified: Secondary | ICD-10-CM | POA: Diagnosis not present

## 2021-01-14 DIAGNOSIS — N186 End stage renal disease: Secondary | ICD-10-CM | POA: Diagnosis not present

## 2021-01-14 DIAGNOSIS — Z23 Encounter for immunization: Secondary | ICD-10-CM | POA: Diagnosis not present

## 2021-01-15 ENCOUNTER — Telehealth: Payer: Self-pay | Admitting: Neurology

## 2021-01-15 MED ORDER — DONEPEZIL HCL 10 MG PO TABS: 10.0000 mg | ORAL_TABLET | Freq: Every day | ORAL | 1 refills | Status: DC

## 2021-01-15 NOTE — Telephone Encounter (Signed)
Pt has been scheduled for a dementia f/u and is on wait list.  Daughter is asking for a refill on pt's donepezil (ARICEPT) 10 MG tablet to [id number]

## 2021-01-15 NOTE — Telephone Encounter (Signed)
Pending appt 05/19/21. Requested rx sent to pharmacy.

## 2021-01-16 DIAGNOSIS — N2581 Secondary hyperparathyroidism of renal origin: Secondary | ICD-10-CM | POA: Diagnosis not present

## 2021-01-16 DIAGNOSIS — D631 Anemia in chronic kidney disease: Secondary | ICD-10-CM | POA: Diagnosis not present

## 2021-01-16 DIAGNOSIS — N186 End stage renal disease: Secondary | ICD-10-CM | POA: Diagnosis not present

## 2021-01-16 DIAGNOSIS — Z23 Encounter for immunization: Secondary | ICD-10-CM | POA: Diagnosis not present

## 2021-01-16 DIAGNOSIS — Z992 Dependence on renal dialysis: Secondary | ICD-10-CM | POA: Diagnosis not present

## 2021-01-16 DIAGNOSIS — D509 Iron deficiency anemia, unspecified: Secondary | ICD-10-CM | POA: Diagnosis not present

## 2021-01-17 ENCOUNTER — Other Ambulatory Visit: Payer: Self-pay | Admitting: Cardiology

## 2021-01-19 ENCOUNTER — Other Ambulatory Visit: Payer: Self-pay | Admitting: Neurology

## 2021-01-19 DIAGNOSIS — N186 End stage renal disease: Secondary | ICD-10-CM | POA: Diagnosis not present

## 2021-01-19 DIAGNOSIS — Z23 Encounter for immunization: Secondary | ICD-10-CM | POA: Diagnosis not present

## 2021-01-19 DIAGNOSIS — Z992 Dependence on renal dialysis: Secondary | ICD-10-CM | POA: Diagnosis not present

## 2021-01-19 DIAGNOSIS — D509 Iron deficiency anemia, unspecified: Secondary | ICD-10-CM | POA: Diagnosis not present

## 2021-01-19 DIAGNOSIS — D631 Anemia in chronic kidney disease: Secondary | ICD-10-CM | POA: Diagnosis not present

## 2021-01-19 DIAGNOSIS — N2581 Secondary hyperparathyroidism of renal origin: Secondary | ICD-10-CM | POA: Diagnosis not present

## 2021-01-21 DIAGNOSIS — D509 Iron deficiency anemia, unspecified: Secondary | ICD-10-CM | POA: Diagnosis not present

## 2021-01-21 DIAGNOSIS — Z23 Encounter for immunization: Secondary | ICD-10-CM | POA: Diagnosis not present

## 2021-01-21 DIAGNOSIS — N2581 Secondary hyperparathyroidism of renal origin: Secondary | ICD-10-CM | POA: Diagnosis not present

## 2021-01-21 DIAGNOSIS — Z992 Dependence on renal dialysis: Secondary | ICD-10-CM | POA: Diagnosis not present

## 2021-01-21 DIAGNOSIS — N186 End stage renal disease: Secondary | ICD-10-CM | POA: Diagnosis not present

## 2021-01-21 DIAGNOSIS — D631 Anemia in chronic kidney disease: Secondary | ICD-10-CM | POA: Diagnosis not present

## 2021-01-23 DIAGNOSIS — Z23 Encounter for immunization: Secondary | ICD-10-CM | POA: Diagnosis not present

## 2021-01-23 DIAGNOSIS — N2581 Secondary hyperparathyroidism of renal origin: Secondary | ICD-10-CM | POA: Diagnosis not present

## 2021-01-23 DIAGNOSIS — D631 Anemia in chronic kidney disease: Secondary | ICD-10-CM | POA: Diagnosis not present

## 2021-01-23 DIAGNOSIS — N186 End stage renal disease: Secondary | ICD-10-CM | POA: Diagnosis not present

## 2021-01-23 DIAGNOSIS — D509 Iron deficiency anemia, unspecified: Secondary | ICD-10-CM | POA: Diagnosis not present

## 2021-01-23 DIAGNOSIS — Z992 Dependence on renal dialysis: Secondary | ICD-10-CM | POA: Diagnosis not present

## 2021-01-26 DIAGNOSIS — Z23 Encounter for immunization: Secondary | ICD-10-CM | POA: Diagnosis not present

## 2021-01-26 DIAGNOSIS — N2581 Secondary hyperparathyroidism of renal origin: Secondary | ICD-10-CM | POA: Diagnosis not present

## 2021-01-26 DIAGNOSIS — Z992 Dependence on renal dialysis: Secondary | ICD-10-CM | POA: Diagnosis not present

## 2021-01-26 DIAGNOSIS — D631 Anemia in chronic kidney disease: Secondary | ICD-10-CM | POA: Diagnosis not present

## 2021-01-26 DIAGNOSIS — D509 Iron deficiency anemia, unspecified: Secondary | ICD-10-CM | POA: Diagnosis not present

## 2021-01-26 DIAGNOSIS — N186 End stage renal disease: Secondary | ICD-10-CM | POA: Diagnosis not present

## 2021-01-27 DIAGNOSIS — N186 End stage renal disease: Secondary | ICD-10-CM | POA: Diagnosis not present

## 2021-01-27 DIAGNOSIS — N269 Renal sclerosis, unspecified: Secondary | ICD-10-CM | POA: Diagnosis not present

## 2021-01-27 DIAGNOSIS — Z992 Dependence on renal dialysis: Secondary | ICD-10-CM | POA: Diagnosis not present

## 2021-01-28 DIAGNOSIS — N186 End stage renal disease: Secondary | ICD-10-CM | POA: Diagnosis not present

## 2021-01-28 DIAGNOSIS — Z992 Dependence on renal dialysis: Secondary | ICD-10-CM | POA: Diagnosis not present

## 2021-01-28 DIAGNOSIS — D509 Iron deficiency anemia, unspecified: Secondary | ICD-10-CM | POA: Diagnosis not present

## 2021-01-28 DIAGNOSIS — N2581 Secondary hyperparathyroidism of renal origin: Secondary | ICD-10-CM | POA: Diagnosis not present

## 2021-01-28 DIAGNOSIS — D631 Anemia in chronic kidney disease: Secondary | ICD-10-CM | POA: Diagnosis not present

## 2021-01-30 DIAGNOSIS — N2581 Secondary hyperparathyroidism of renal origin: Secondary | ICD-10-CM | POA: Diagnosis not present

## 2021-01-30 DIAGNOSIS — D631 Anemia in chronic kidney disease: Secondary | ICD-10-CM | POA: Diagnosis not present

## 2021-01-30 DIAGNOSIS — N186 End stage renal disease: Secondary | ICD-10-CM | POA: Diagnosis not present

## 2021-01-30 DIAGNOSIS — Z992 Dependence on renal dialysis: Secondary | ICD-10-CM | POA: Diagnosis not present

## 2021-01-30 DIAGNOSIS — D509 Iron deficiency anemia, unspecified: Secondary | ICD-10-CM | POA: Diagnosis not present

## 2021-02-02 ENCOUNTER — Other Ambulatory Visit: Payer: Self-pay | Admitting: Cardiology

## 2021-02-02 DIAGNOSIS — Z992 Dependence on renal dialysis: Secondary | ICD-10-CM | POA: Diagnosis not present

## 2021-02-02 DIAGNOSIS — D509 Iron deficiency anemia, unspecified: Secondary | ICD-10-CM | POA: Diagnosis not present

## 2021-02-02 DIAGNOSIS — D631 Anemia in chronic kidney disease: Secondary | ICD-10-CM | POA: Diagnosis not present

## 2021-02-02 DIAGNOSIS — N186 End stage renal disease: Secondary | ICD-10-CM | POA: Diagnosis not present

## 2021-02-02 DIAGNOSIS — N2581 Secondary hyperparathyroidism of renal origin: Secondary | ICD-10-CM | POA: Diagnosis not present

## 2021-02-04 DIAGNOSIS — D509 Iron deficiency anemia, unspecified: Secondary | ICD-10-CM | POA: Diagnosis not present

## 2021-02-04 DIAGNOSIS — Z992 Dependence on renal dialysis: Secondary | ICD-10-CM | POA: Diagnosis not present

## 2021-02-04 DIAGNOSIS — N2581 Secondary hyperparathyroidism of renal origin: Secondary | ICD-10-CM | POA: Diagnosis not present

## 2021-02-04 DIAGNOSIS — N186 End stage renal disease: Secondary | ICD-10-CM | POA: Diagnosis not present

## 2021-02-04 DIAGNOSIS — D631 Anemia in chronic kidney disease: Secondary | ICD-10-CM | POA: Diagnosis not present

## 2021-02-05 DIAGNOSIS — Z20822 Contact with and (suspected) exposure to covid-19: Secondary | ICD-10-CM | POA: Diagnosis not present

## 2021-02-06 DIAGNOSIS — N2581 Secondary hyperparathyroidism of renal origin: Secondary | ICD-10-CM | POA: Diagnosis not present

## 2021-02-06 DIAGNOSIS — Z992 Dependence on renal dialysis: Secondary | ICD-10-CM | POA: Diagnosis not present

## 2021-02-06 DIAGNOSIS — D509 Iron deficiency anemia, unspecified: Secondary | ICD-10-CM | POA: Diagnosis not present

## 2021-02-06 DIAGNOSIS — N186 End stage renal disease: Secondary | ICD-10-CM | POA: Diagnosis not present

## 2021-02-06 DIAGNOSIS — D631 Anemia in chronic kidney disease: Secondary | ICD-10-CM | POA: Diagnosis not present

## 2021-02-09 DIAGNOSIS — D509 Iron deficiency anemia, unspecified: Secondary | ICD-10-CM | POA: Diagnosis not present

## 2021-02-09 DIAGNOSIS — D631 Anemia in chronic kidney disease: Secondary | ICD-10-CM | POA: Diagnosis not present

## 2021-02-09 DIAGNOSIS — Z992 Dependence on renal dialysis: Secondary | ICD-10-CM | POA: Diagnosis not present

## 2021-02-09 DIAGNOSIS — N2581 Secondary hyperparathyroidism of renal origin: Secondary | ICD-10-CM | POA: Diagnosis not present

## 2021-02-09 DIAGNOSIS — N186 End stage renal disease: Secondary | ICD-10-CM | POA: Diagnosis not present

## 2021-02-11 DIAGNOSIS — D631 Anemia in chronic kidney disease: Secondary | ICD-10-CM | POA: Diagnosis not present

## 2021-02-11 DIAGNOSIS — D509 Iron deficiency anemia, unspecified: Secondary | ICD-10-CM | POA: Diagnosis not present

## 2021-02-11 DIAGNOSIS — N186 End stage renal disease: Secondary | ICD-10-CM | POA: Diagnosis not present

## 2021-02-11 DIAGNOSIS — N2581 Secondary hyperparathyroidism of renal origin: Secondary | ICD-10-CM | POA: Diagnosis not present

## 2021-02-11 DIAGNOSIS — Z992 Dependence on renal dialysis: Secondary | ICD-10-CM | POA: Diagnosis not present

## 2021-02-13 ENCOUNTER — Ambulatory Visit (INDEPENDENT_AMBULATORY_CARE_PROVIDER_SITE_OTHER): Payer: Medicare Other

## 2021-02-13 DIAGNOSIS — D509 Iron deficiency anemia, unspecified: Secondary | ICD-10-CM | POA: Diagnosis not present

## 2021-02-13 DIAGNOSIS — N186 End stage renal disease: Secondary | ICD-10-CM | POA: Diagnosis not present

## 2021-02-13 DIAGNOSIS — D631 Anemia in chronic kidney disease: Secondary | ICD-10-CM | POA: Diagnosis not present

## 2021-02-13 DIAGNOSIS — Z992 Dependence on renal dialysis: Secondary | ICD-10-CM | POA: Diagnosis not present

## 2021-02-13 DIAGNOSIS — I442 Atrioventricular block, complete: Secondary | ICD-10-CM | POA: Diagnosis not present

## 2021-02-13 DIAGNOSIS — N2581 Secondary hyperparathyroidism of renal origin: Secondary | ICD-10-CM | POA: Diagnosis not present

## 2021-02-13 LAB — CUP PACEART REMOTE DEVICE CHECK
Battery Remaining Longevity: 100 mo
Battery Remaining Percentage: 79 %
Battery Voltage: 3.01 V
Brady Statistic AP VP Percent: 89 %
Brady Statistic AP VS Percent: 1 %
Brady Statistic AS VP Percent: 8.7 %
Brady Statistic AS VS Percent: 2.1 %
Brady Statistic RA Percent Paced: 9.9 %
Brady Statistic RV Percent Paced: 99 %
Date Time Interrogation Session: 20221118020014
Implantable Lead Implant Date: 20201113
Implantable Lead Implant Date: 20201113
Implantable Lead Location: 753859
Implantable Lead Location: 753860
Implantable Pulse Generator Implant Date: 20201113
Lead Channel Impedance Value: 460 Ohm
Lead Channel Impedance Value: 480 Ohm
Lead Channel Pacing Threshold Amplitude: 0.625 V
Lead Channel Pacing Threshold Amplitude: 0.75 V
Lead Channel Pacing Threshold Pulse Width: 0.4 ms
Lead Channel Pacing Threshold Pulse Width: 0.4 ms
Lead Channel Sensing Intrinsic Amplitude: 12 mV
Lead Channel Sensing Intrinsic Amplitude: 5 mV
Lead Channel Setting Pacing Amplitude: 1 V
Lead Channel Setting Pacing Amplitude: 1.625
Lead Channel Setting Pacing Pulse Width: 0.4 ms
Lead Channel Setting Sensing Sensitivity: 2 mV
Pulse Gen Model: 2272
Pulse Gen Serial Number: 9178446

## 2021-02-16 DIAGNOSIS — Z992 Dependence on renal dialysis: Secondary | ICD-10-CM | POA: Diagnosis not present

## 2021-02-16 DIAGNOSIS — N2581 Secondary hyperparathyroidism of renal origin: Secondary | ICD-10-CM | POA: Diagnosis not present

## 2021-02-16 DIAGNOSIS — D509 Iron deficiency anemia, unspecified: Secondary | ICD-10-CM | POA: Diagnosis not present

## 2021-02-16 DIAGNOSIS — D631 Anemia in chronic kidney disease: Secondary | ICD-10-CM | POA: Diagnosis not present

## 2021-02-16 DIAGNOSIS — N186 End stage renal disease: Secondary | ICD-10-CM | POA: Diagnosis not present

## 2021-02-18 DIAGNOSIS — Z992 Dependence on renal dialysis: Secondary | ICD-10-CM | POA: Diagnosis not present

## 2021-02-18 DIAGNOSIS — N186 End stage renal disease: Secondary | ICD-10-CM | POA: Diagnosis not present

## 2021-02-18 DIAGNOSIS — D509 Iron deficiency anemia, unspecified: Secondary | ICD-10-CM | POA: Diagnosis not present

## 2021-02-18 DIAGNOSIS — N2581 Secondary hyperparathyroidism of renal origin: Secondary | ICD-10-CM | POA: Diagnosis not present

## 2021-02-18 DIAGNOSIS — D631 Anemia in chronic kidney disease: Secondary | ICD-10-CM | POA: Diagnosis not present

## 2021-02-21 DIAGNOSIS — D509 Iron deficiency anemia, unspecified: Secondary | ICD-10-CM | POA: Diagnosis not present

## 2021-02-21 DIAGNOSIS — N2581 Secondary hyperparathyroidism of renal origin: Secondary | ICD-10-CM | POA: Diagnosis not present

## 2021-02-21 DIAGNOSIS — N186 End stage renal disease: Secondary | ICD-10-CM | POA: Diagnosis not present

## 2021-02-21 DIAGNOSIS — D631 Anemia in chronic kidney disease: Secondary | ICD-10-CM | POA: Diagnosis not present

## 2021-02-21 DIAGNOSIS — Z992 Dependence on renal dialysis: Secondary | ICD-10-CM | POA: Diagnosis not present

## 2021-02-23 DIAGNOSIS — D509 Iron deficiency anemia, unspecified: Secondary | ICD-10-CM | POA: Diagnosis not present

## 2021-02-23 DIAGNOSIS — N2581 Secondary hyperparathyroidism of renal origin: Secondary | ICD-10-CM | POA: Diagnosis not present

## 2021-02-23 DIAGNOSIS — N186 End stage renal disease: Secondary | ICD-10-CM | POA: Diagnosis not present

## 2021-02-23 DIAGNOSIS — Z992 Dependence on renal dialysis: Secondary | ICD-10-CM | POA: Diagnosis not present

## 2021-02-23 DIAGNOSIS — D631 Anemia in chronic kidney disease: Secondary | ICD-10-CM | POA: Diagnosis not present

## 2021-02-23 NOTE — Progress Notes (Signed)
Remote pacemaker transmission.   

## 2021-02-25 DIAGNOSIS — N186 End stage renal disease: Secondary | ICD-10-CM | POA: Diagnosis not present

## 2021-02-25 DIAGNOSIS — D509 Iron deficiency anemia, unspecified: Secondary | ICD-10-CM | POA: Diagnosis not present

## 2021-02-25 DIAGNOSIS — Z992 Dependence on renal dialysis: Secondary | ICD-10-CM | POA: Diagnosis not present

## 2021-02-25 DIAGNOSIS — D631 Anemia in chronic kidney disease: Secondary | ICD-10-CM | POA: Diagnosis not present

## 2021-02-25 DIAGNOSIS — N2581 Secondary hyperparathyroidism of renal origin: Secondary | ICD-10-CM | POA: Diagnosis not present

## 2021-02-26 DIAGNOSIS — N186 End stage renal disease: Secondary | ICD-10-CM | POA: Diagnosis not present

## 2021-02-26 DIAGNOSIS — N269 Renal sclerosis, unspecified: Secondary | ICD-10-CM | POA: Diagnosis not present

## 2021-02-26 DIAGNOSIS — Z992 Dependence on renal dialysis: Secondary | ICD-10-CM | POA: Diagnosis not present

## 2021-02-27 DIAGNOSIS — N186 End stage renal disease: Secondary | ICD-10-CM | POA: Diagnosis not present

## 2021-02-27 DIAGNOSIS — Z992 Dependence on renal dialysis: Secondary | ICD-10-CM | POA: Diagnosis not present

## 2021-02-27 DIAGNOSIS — N2581 Secondary hyperparathyroidism of renal origin: Secondary | ICD-10-CM | POA: Diagnosis not present

## 2021-02-27 DIAGNOSIS — D631 Anemia in chronic kidney disease: Secondary | ICD-10-CM | POA: Diagnosis not present

## 2021-03-02 DIAGNOSIS — Z992 Dependence on renal dialysis: Secondary | ICD-10-CM | POA: Diagnosis not present

## 2021-03-02 DIAGNOSIS — N186 End stage renal disease: Secondary | ICD-10-CM | POA: Diagnosis not present

## 2021-03-02 DIAGNOSIS — D631 Anemia in chronic kidney disease: Secondary | ICD-10-CM | POA: Diagnosis not present

## 2021-03-02 DIAGNOSIS — N2581 Secondary hyperparathyroidism of renal origin: Secondary | ICD-10-CM | POA: Diagnosis not present

## 2021-03-04 DIAGNOSIS — D631 Anemia in chronic kidney disease: Secondary | ICD-10-CM | POA: Diagnosis not present

## 2021-03-04 DIAGNOSIS — Z992 Dependence on renal dialysis: Secondary | ICD-10-CM | POA: Diagnosis not present

## 2021-03-04 DIAGNOSIS — N2581 Secondary hyperparathyroidism of renal origin: Secondary | ICD-10-CM | POA: Diagnosis not present

## 2021-03-04 DIAGNOSIS — N186 End stage renal disease: Secondary | ICD-10-CM | POA: Diagnosis not present

## 2021-03-06 DIAGNOSIS — N2581 Secondary hyperparathyroidism of renal origin: Secondary | ICD-10-CM | POA: Diagnosis not present

## 2021-03-06 DIAGNOSIS — D631 Anemia in chronic kidney disease: Secondary | ICD-10-CM | POA: Diagnosis not present

## 2021-03-06 DIAGNOSIS — N186 End stage renal disease: Secondary | ICD-10-CM | POA: Diagnosis not present

## 2021-03-06 DIAGNOSIS — Z992 Dependence on renal dialysis: Secondary | ICD-10-CM | POA: Diagnosis not present

## 2021-03-09 DIAGNOSIS — N186 End stage renal disease: Secondary | ICD-10-CM | POA: Diagnosis not present

## 2021-03-09 DIAGNOSIS — Z992 Dependence on renal dialysis: Secondary | ICD-10-CM | POA: Diagnosis not present

## 2021-03-09 DIAGNOSIS — N2581 Secondary hyperparathyroidism of renal origin: Secondary | ICD-10-CM | POA: Diagnosis not present

## 2021-03-09 DIAGNOSIS — D631 Anemia in chronic kidney disease: Secondary | ICD-10-CM | POA: Diagnosis not present

## 2021-03-11 DIAGNOSIS — Z992 Dependence on renal dialysis: Secondary | ICD-10-CM | POA: Diagnosis not present

## 2021-03-11 DIAGNOSIS — D631 Anemia in chronic kidney disease: Secondary | ICD-10-CM | POA: Diagnosis not present

## 2021-03-11 DIAGNOSIS — N2581 Secondary hyperparathyroidism of renal origin: Secondary | ICD-10-CM | POA: Diagnosis not present

## 2021-03-11 DIAGNOSIS — N186 End stage renal disease: Secondary | ICD-10-CM | POA: Diagnosis not present

## 2021-03-13 DIAGNOSIS — Z992 Dependence on renal dialysis: Secondary | ICD-10-CM | POA: Diagnosis not present

## 2021-03-13 DIAGNOSIS — N2581 Secondary hyperparathyroidism of renal origin: Secondary | ICD-10-CM | POA: Diagnosis not present

## 2021-03-13 DIAGNOSIS — N186 End stage renal disease: Secondary | ICD-10-CM | POA: Diagnosis not present

## 2021-03-13 DIAGNOSIS — D631 Anemia in chronic kidney disease: Secondary | ICD-10-CM | POA: Diagnosis not present

## 2021-03-16 DIAGNOSIS — N2581 Secondary hyperparathyroidism of renal origin: Secondary | ICD-10-CM | POA: Diagnosis not present

## 2021-03-16 DIAGNOSIS — Z992 Dependence on renal dialysis: Secondary | ICD-10-CM | POA: Diagnosis not present

## 2021-03-16 DIAGNOSIS — N186 End stage renal disease: Secondary | ICD-10-CM | POA: Diagnosis not present

## 2021-03-16 DIAGNOSIS — D631 Anemia in chronic kidney disease: Secondary | ICD-10-CM | POA: Diagnosis not present

## 2021-03-18 DIAGNOSIS — N186 End stage renal disease: Secondary | ICD-10-CM | POA: Diagnosis not present

## 2021-03-18 DIAGNOSIS — D631 Anemia in chronic kidney disease: Secondary | ICD-10-CM | POA: Diagnosis not present

## 2021-03-18 DIAGNOSIS — N2581 Secondary hyperparathyroidism of renal origin: Secondary | ICD-10-CM | POA: Diagnosis not present

## 2021-03-18 DIAGNOSIS — Z992 Dependence on renal dialysis: Secondary | ICD-10-CM | POA: Diagnosis not present

## 2021-03-20 DIAGNOSIS — N186 End stage renal disease: Secondary | ICD-10-CM | POA: Diagnosis not present

## 2021-03-20 DIAGNOSIS — Z992 Dependence on renal dialysis: Secondary | ICD-10-CM | POA: Diagnosis not present

## 2021-03-20 DIAGNOSIS — N2581 Secondary hyperparathyroidism of renal origin: Secondary | ICD-10-CM | POA: Diagnosis not present

## 2021-03-20 DIAGNOSIS — D631 Anemia in chronic kidney disease: Secondary | ICD-10-CM | POA: Diagnosis not present

## 2021-03-23 DIAGNOSIS — D631 Anemia in chronic kidney disease: Secondary | ICD-10-CM | POA: Diagnosis not present

## 2021-03-23 DIAGNOSIS — N186 End stage renal disease: Secondary | ICD-10-CM | POA: Diagnosis not present

## 2021-03-23 DIAGNOSIS — Z992 Dependence on renal dialysis: Secondary | ICD-10-CM | POA: Diagnosis not present

## 2021-03-23 DIAGNOSIS — N2581 Secondary hyperparathyroidism of renal origin: Secondary | ICD-10-CM | POA: Diagnosis not present

## 2021-03-25 DIAGNOSIS — N186 End stage renal disease: Secondary | ICD-10-CM | POA: Diagnosis not present

## 2021-03-25 DIAGNOSIS — Z992 Dependence on renal dialysis: Secondary | ICD-10-CM | POA: Diagnosis not present

## 2021-03-25 DIAGNOSIS — D631 Anemia in chronic kidney disease: Secondary | ICD-10-CM | POA: Diagnosis not present

## 2021-03-25 DIAGNOSIS — N2581 Secondary hyperparathyroidism of renal origin: Secondary | ICD-10-CM | POA: Diagnosis not present

## 2021-03-27 DIAGNOSIS — N186 End stage renal disease: Secondary | ICD-10-CM | POA: Diagnosis not present

## 2021-03-27 DIAGNOSIS — Z992 Dependence on renal dialysis: Secondary | ICD-10-CM | POA: Diagnosis not present

## 2021-03-27 DIAGNOSIS — N2581 Secondary hyperparathyroidism of renal origin: Secondary | ICD-10-CM | POA: Diagnosis not present

## 2021-03-27 DIAGNOSIS — D631 Anemia in chronic kidney disease: Secondary | ICD-10-CM | POA: Diagnosis not present

## 2021-03-29 DIAGNOSIS — Z992 Dependence on renal dialysis: Secondary | ICD-10-CM | POA: Diagnosis not present

## 2021-03-29 DIAGNOSIS — N269 Renal sclerosis, unspecified: Secondary | ICD-10-CM | POA: Diagnosis not present

## 2021-03-29 DIAGNOSIS — N186 End stage renal disease: Secondary | ICD-10-CM | POA: Diagnosis not present

## 2021-03-30 DIAGNOSIS — N186 End stage renal disease: Secondary | ICD-10-CM | POA: Diagnosis not present

## 2021-03-30 DIAGNOSIS — Z992 Dependence on renal dialysis: Secondary | ICD-10-CM | POA: Diagnosis not present

## 2021-03-30 DIAGNOSIS — N2581 Secondary hyperparathyroidism of renal origin: Secondary | ICD-10-CM | POA: Diagnosis not present

## 2021-03-31 DIAGNOSIS — T82858A Stenosis of vascular prosthetic devices, implants and grafts, initial encounter: Secondary | ICD-10-CM | POA: Diagnosis not present

## 2021-03-31 DIAGNOSIS — Z992 Dependence on renal dialysis: Secondary | ICD-10-CM | POA: Diagnosis not present

## 2021-03-31 DIAGNOSIS — N186 End stage renal disease: Secondary | ICD-10-CM | POA: Diagnosis not present

## 2021-03-31 DIAGNOSIS — I871 Compression of vein: Secondary | ICD-10-CM | POA: Diagnosis not present

## 2021-04-01 DIAGNOSIS — Z992 Dependence on renal dialysis: Secondary | ICD-10-CM | POA: Diagnosis not present

## 2021-04-01 DIAGNOSIS — N2581 Secondary hyperparathyroidism of renal origin: Secondary | ICD-10-CM | POA: Diagnosis not present

## 2021-04-01 DIAGNOSIS — N186 End stage renal disease: Secondary | ICD-10-CM | POA: Diagnosis not present

## 2021-04-03 DIAGNOSIS — N186 End stage renal disease: Secondary | ICD-10-CM | POA: Diagnosis not present

## 2021-04-03 DIAGNOSIS — N2581 Secondary hyperparathyroidism of renal origin: Secondary | ICD-10-CM | POA: Diagnosis not present

## 2021-04-03 DIAGNOSIS — Z992 Dependence on renal dialysis: Secondary | ICD-10-CM | POA: Diagnosis not present

## 2021-04-06 DIAGNOSIS — N2581 Secondary hyperparathyroidism of renal origin: Secondary | ICD-10-CM | POA: Diagnosis not present

## 2021-04-06 DIAGNOSIS — Z992 Dependence on renal dialysis: Secondary | ICD-10-CM | POA: Diagnosis not present

## 2021-04-06 DIAGNOSIS — N186 End stage renal disease: Secondary | ICD-10-CM | POA: Diagnosis not present

## 2021-04-08 DIAGNOSIS — N186 End stage renal disease: Secondary | ICD-10-CM | POA: Diagnosis not present

## 2021-04-08 DIAGNOSIS — N2581 Secondary hyperparathyroidism of renal origin: Secondary | ICD-10-CM | POA: Diagnosis not present

## 2021-04-08 DIAGNOSIS — Z992 Dependence on renal dialysis: Secondary | ICD-10-CM | POA: Diagnosis not present

## 2021-04-10 DIAGNOSIS — N186 End stage renal disease: Secondary | ICD-10-CM | POA: Diagnosis not present

## 2021-04-10 DIAGNOSIS — Z992 Dependence on renal dialysis: Secondary | ICD-10-CM | POA: Diagnosis not present

## 2021-04-10 DIAGNOSIS — N2581 Secondary hyperparathyroidism of renal origin: Secondary | ICD-10-CM | POA: Diagnosis not present

## 2021-04-13 DIAGNOSIS — N2581 Secondary hyperparathyroidism of renal origin: Secondary | ICD-10-CM | POA: Diagnosis not present

## 2021-04-13 DIAGNOSIS — N186 End stage renal disease: Secondary | ICD-10-CM | POA: Diagnosis not present

## 2021-04-13 DIAGNOSIS — Z992 Dependence on renal dialysis: Secondary | ICD-10-CM | POA: Diagnosis not present

## 2021-04-15 DIAGNOSIS — N2581 Secondary hyperparathyroidism of renal origin: Secondary | ICD-10-CM | POA: Diagnosis not present

## 2021-04-15 DIAGNOSIS — N186 End stage renal disease: Secondary | ICD-10-CM | POA: Diagnosis not present

## 2021-04-15 DIAGNOSIS — Z992 Dependence on renal dialysis: Secondary | ICD-10-CM | POA: Diagnosis not present

## 2021-04-17 DIAGNOSIS — Z992 Dependence on renal dialysis: Secondary | ICD-10-CM | POA: Diagnosis not present

## 2021-04-17 DIAGNOSIS — N186 End stage renal disease: Secondary | ICD-10-CM | POA: Diagnosis not present

## 2021-04-17 DIAGNOSIS — N2581 Secondary hyperparathyroidism of renal origin: Secondary | ICD-10-CM | POA: Diagnosis not present

## 2021-04-20 DIAGNOSIS — N186 End stage renal disease: Secondary | ICD-10-CM | POA: Diagnosis not present

## 2021-04-20 DIAGNOSIS — Z992 Dependence on renal dialysis: Secondary | ICD-10-CM | POA: Diagnosis not present

## 2021-04-20 DIAGNOSIS — N2581 Secondary hyperparathyroidism of renal origin: Secondary | ICD-10-CM | POA: Diagnosis not present

## 2021-04-22 DIAGNOSIS — Z992 Dependence on renal dialysis: Secondary | ICD-10-CM | POA: Diagnosis not present

## 2021-04-22 DIAGNOSIS — N2581 Secondary hyperparathyroidism of renal origin: Secondary | ICD-10-CM | POA: Diagnosis not present

## 2021-04-22 DIAGNOSIS — N186 End stage renal disease: Secondary | ICD-10-CM | POA: Diagnosis not present

## 2021-04-24 DIAGNOSIS — N186 End stage renal disease: Secondary | ICD-10-CM | POA: Diagnosis not present

## 2021-04-24 DIAGNOSIS — N2581 Secondary hyperparathyroidism of renal origin: Secondary | ICD-10-CM | POA: Diagnosis not present

## 2021-04-24 DIAGNOSIS — Z992 Dependence on renal dialysis: Secondary | ICD-10-CM | POA: Diagnosis not present

## 2021-04-27 DIAGNOSIS — N2581 Secondary hyperparathyroidism of renal origin: Secondary | ICD-10-CM | POA: Diagnosis not present

## 2021-04-27 DIAGNOSIS — N186 End stage renal disease: Secondary | ICD-10-CM | POA: Diagnosis not present

## 2021-04-27 DIAGNOSIS — Z992 Dependence on renal dialysis: Secondary | ICD-10-CM | POA: Diagnosis not present

## 2021-04-29 DIAGNOSIS — N2581 Secondary hyperparathyroidism of renal origin: Secondary | ICD-10-CM | POA: Diagnosis not present

## 2021-04-29 DIAGNOSIS — N269 Renal sclerosis, unspecified: Secondary | ICD-10-CM | POA: Diagnosis not present

## 2021-04-29 DIAGNOSIS — N186 End stage renal disease: Secondary | ICD-10-CM | POA: Diagnosis not present

## 2021-04-29 DIAGNOSIS — Z992 Dependence on renal dialysis: Secondary | ICD-10-CM | POA: Diagnosis not present

## 2021-04-29 DIAGNOSIS — D631 Anemia in chronic kidney disease: Secondary | ICD-10-CM | POA: Diagnosis not present

## 2021-05-01 DIAGNOSIS — N186 End stage renal disease: Secondary | ICD-10-CM | POA: Diagnosis not present

## 2021-05-01 DIAGNOSIS — D631 Anemia in chronic kidney disease: Secondary | ICD-10-CM | POA: Diagnosis not present

## 2021-05-01 DIAGNOSIS — Z992 Dependence on renal dialysis: Secondary | ICD-10-CM | POA: Diagnosis not present

## 2021-05-01 DIAGNOSIS — N2581 Secondary hyperparathyroidism of renal origin: Secondary | ICD-10-CM | POA: Diagnosis not present

## 2021-05-04 DIAGNOSIS — N2581 Secondary hyperparathyroidism of renal origin: Secondary | ICD-10-CM | POA: Diagnosis not present

## 2021-05-04 DIAGNOSIS — Z992 Dependence on renal dialysis: Secondary | ICD-10-CM | POA: Diagnosis not present

## 2021-05-04 DIAGNOSIS — D631 Anemia in chronic kidney disease: Secondary | ICD-10-CM | POA: Diagnosis not present

## 2021-05-04 DIAGNOSIS — N186 End stage renal disease: Secondary | ICD-10-CM | POA: Diagnosis not present

## 2021-05-06 DIAGNOSIS — N2581 Secondary hyperparathyroidism of renal origin: Secondary | ICD-10-CM | POA: Diagnosis not present

## 2021-05-06 DIAGNOSIS — N186 End stage renal disease: Secondary | ICD-10-CM | POA: Diagnosis not present

## 2021-05-06 DIAGNOSIS — Z992 Dependence on renal dialysis: Secondary | ICD-10-CM | POA: Diagnosis not present

## 2021-05-06 DIAGNOSIS — D631 Anemia in chronic kidney disease: Secondary | ICD-10-CM | POA: Diagnosis not present

## 2021-05-08 DIAGNOSIS — Z992 Dependence on renal dialysis: Secondary | ICD-10-CM | POA: Diagnosis not present

## 2021-05-08 DIAGNOSIS — N2581 Secondary hyperparathyroidism of renal origin: Secondary | ICD-10-CM | POA: Diagnosis not present

## 2021-05-08 DIAGNOSIS — D631 Anemia in chronic kidney disease: Secondary | ICD-10-CM | POA: Diagnosis not present

## 2021-05-08 DIAGNOSIS — N186 End stage renal disease: Secondary | ICD-10-CM | POA: Diagnosis not present

## 2021-05-11 DIAGNOSIS — D631 Anemia in chronic kidney disease: Secondary | ICD-10-CM | POA: Diagnosis not present

## 2021-05-11 DIAGNOSIS — Z992 Dependence on renal dialysis: Secondary | ICD-10-CM | POA: Diagnosis not present

## 2021-05-11 DIAGNOSIS — N2581 Secondary hyperparathyroidism of renal origin: Secondary | ICD-10-CM | POA: Diagnosis not present

## 2021-05-11 DIAGNOSIS — N186 End stage renal disease: Secondary | ICD-10-CM | POA: Diagnosis not present

## 2021-05-13 DIAGNOSIS — Z992 Dependence on renal dialysis: Secondary | ICD-10-CM | POA: Diagnosis not present

## 2021-05-13 DIAGNOSIS — D631 Anemia in chronic kidney disease: Secondary | ICD-10-CM | POA: Diagnosis not present

## 2021-05-13 DIAGNOSIS — N2581 Secondary hyperparathyroidism of renal origin: Secondary | ICD-10-CM | POA: Diagnosis not present

## 2021-05-13 DIAGNOSIS — N186 End stage renal disease: Secondary | ICD-10-CM | POA: Diagnosis not present

## 2021-05-15 DIAGNOSIS — Z992 Dependence on renal dialysis: Secondary | ICD-10-CM | POA: Diagnosis not present

## 2021-05-15 DIAGNOSIS — N2581 Secondary hyperparathyroidism of renal origin: Secondary | ICD-10-CM | POA: Diagnosis not present

## 2021-05-15 DIAGNOSIS — N186 End stage renal disease: Secondary | ICD-10-CM | POA: Diagnosis not present

## 2021-05-15 DIAGNOSIS — D631 Anemia in chronic kidney disease: Secondary | ICD-10-CM | POA: Diagnosis not present

## 2021-05-15 LAB — CUP PACEART REMOTE DEVICE CHECK
Battery Remaining Longevity: 96 mo
Battery Remaining Percentage: 76 %
Battery Voltage: 2.99 V
Brady Statistic AP VP Percent: 89 %
Brady Statistic AP VS Percent: 1 %
Brady Statistic AS VP Percent: 8.7 %
Brady Statistic AS VS Percent: 2.1 %
Brady Statistic RA Percent Paced: 8.6 %
Brady Statistic RV Percent Paced: 99 %
Date Time Interrogation Session: 20230217020013
Implantable Lead Implant Date: 20201113
Implantable Lead Implant Date: 20201113
Implantable Lead Location: 753859
Implantable Lead Location: 753860
Implantable Pulse Generator Implant Date: 20201113
Lead Channel Impedance Value: 400 Ohm
Lead Channel Impedance Value: 460 Ohm
Lead Channel Pacing Threshold Amplitude: 0.625 V
Lead Channel Pacing Threshold Amplitude: 0.75 V
Lead Channel Pacing Threshold Pulse Width: 0.4 ms
Lead Channel Pacing Threshold Pulse Width: 0.4 ms
Lead Channel Sensing Intrinsic Amplitude: 10.1 mV
Lead Channel Sensing Intrinsic Amplitude: 5 mV
Lead Channel Setting Pacing Amplitude: 1 V
Lead Channel Setting Pacing Amplitude: 1.625
Lead Channel Setting Pacing Pulse Width: 0.4 ms
Lead Channel Setting Sensing Sensitivity: 2 mV
Pulse Gen Model: 2272
Pulse Gen Serial Number: 9178446

## 2021-05-18 DIAGNOSIS — D631 Anemia in chronic kidney disease: Secondary | ICD-10-CM | POA: Diagnosis not present

## 2021-05-18 DIAGNOSIS — N2581 Secondary hyperparathyroidism of renal origin: Secondary | ICD-10-CM | POA: Diagnosis not present

## 2021-05-18 DIAGNOSIS — Z992 Dependence on renal dialysis: Secondary | ICD-10-CM | POA: Diagnosis not present

## 2021-05-18 DIAGNOSIS — N186 End stage renal disease: Secondary | ICD-10-CM | POA: Diagnosis not present

## 2021-05-18 NOTE — Progress Notes (Unsigned)
PATIENT: Jeffrey Campbell DOB: 12-10-1932  REASON FOR VISIT: follow up for memory HISTORY FROM: patient PRIMARY NEUROLOGIST: Dr. Krista Campbell   HISTORY  Jeffrey Campbell is a 88 year old male, seen in request by his primary care physician Dr. Dr. Leighton Campbell for evaluation of memory loss, he is accompanied by his son Jeffrey Campbell today's clinical visit.   I have reviewed and summarized the referring note from the referring physician.  He has past medical history of hypertension, hyperlipidemia, coronary artery disease, chronic kidney disease, prediabetes, previous alcohol use, presented with gradual onset memory loss has few years   He retired from [employer], he was noted to have gradual onset memory loss over the past couple years, he got lost while driving at the beginning of the year, he still reads newspaper at home, significant gait abnormality, today's Mini-Mental Status Examination 17 out of 30   I personally reviewed September 02, 2018: mild generalized atrophy, small vessel disease, no acute abnormalities.   Laboratory evaluations on September 07, 2018, ferritin was elevated 524, ammonia 26, CMP showed creatinine of 3.37, GFR of 16, CBC showed hemoglobin of 10.5, platelet of 126, vitamin B12 level was mildly elevated at 1095   Update January 18, 2019 SS: Jeffrey Campbell is an 88 year old male here for follow-up for memory disturbance.  He is here today with his nephew, Jeffrey Campbell.  He feels his memory is stable.  He continues to live with his wife, he does not drive because of issues with his vision.  He is able to perform his own ADLs, he reports a good appetite.  During the day, he admits he is not very active, enjoys watching TV or taking naps.  He used to enjoy working in his lawn, but he says he is not able to do that anymore.  His wife administers his medications.  He remains on Aricept and Namenda.  He indicates he sleeps well at night.  He has not had any falls.  He does have a dialysis graft to his left  arm, but is not sure he will be undergoing dialysis or not.  He presents today for follow-up.  Update July 19, 2019 SS: Here with his daughter, indicates his memory is overall stable, has good and bad days, does get his days mixed up.  He will be starting dialysis, went today for first treatment, but couldn't be completed for some reason, has to go back tomorrow. He was hospitalized in November had pacemaker placed for complete heart block.  He remains on Aricept and Namenda.  He lives with his wife, is able to do his own ADLs.  He sleeps well and has a good appetite.  Since last seen, he has lost about 20 pounds, but has had chronic health issues, along with Covid.  No recent falls, uses a cane.  He mostly enjoys watching TV, usually stays inside. He does not drive.  Update May 19, 2021 SS:   REVIEW OF SYSTEMS: Out of a complete 14 system review of symptoms, the patient complains only of the following symptoms, and all other reviewed systems are negative.  Memory loss  ALLERGIES: Allergies  Allergen Reactions   Iodinated Contrast Media Other (See Comments)    Right heart cath 07/2016 allegedly "shut down his kidneys" Patient has stage III chronic kidney disease   Zocor [Simvastatin] Other (See Comments)    Liver problems    Calcitriol Other (See Comments)   Budesonide-Formoterol Fumarate Other (See Comments)    Dry mouth  Minoxidil Other (See Comments)    Unknown raction   Tiotropium Bromide Monohydrate Other (See Comments)    Dry mouth   Tricor [Fenofibrate]     Unknown reaction    HOME MEDICATIONS: Outpatient Medications Prior to Visit  Medication Sig Dispense Refill   amLODipine (NORVASC) 10 MG tablet TAKE 1 TABLET BY MOUTH EVERY DAY 90 tablet 1   apixaban (ELIQUIS) 2.5 MG TABS tablet Take 1 tablet (2.5 mg total) by mouth 2 (two) times daily. 180 tablet 3   atorvastatin (LIPITOR) 20 MG tablet TAKE 1 TABLET BY MOUTH EVERY DAY 90 tablet 3   Brinzolamide-Brimonidine  (SIMBRINZA) 1-0.2 % SUSP Place 1 drop into both eyes 2 (two) times daily.      calcitRIOL (ROCALTROL) 0.25 MCG capsule Take 0.25 mcg by mouth daily.     calcium acetate (PHOSLO) 667 MG capsule Take 667 mg by mouth 3 (three) times daily.     cinacalcet (SENSIPAR) 30 MG tablet Take by mouth.     donepezil (ARICEPT) 10 MG tablet Take 1 tablet (10 mg total) by mouth at bedtime. 90 tablet 1   doxazosin (CARDURA) 4 MG tablet Take 1 tablet (4 mg total) by mouth daily. 30 tablet 3   Epoetin Alfa (PROCRIT IJ) Inject 5,000 Units as directed every 14 (fourteen) days.      folic acid-vitamin b complex-vitamin c-selenium-zinc (DIALYVITE) 3 MG TABS tablet Take 1 tablet by mouth daily.     hydrALAZINE (APRESOLINE) 100 MG tablet TAKE 1 TABLET BY MOUTH 3 TIMES DAILY. PLEASE MAKE YEARLY APPT WITH DR.TURNER FOR OCTOBER 1ST ATTEMPT 90 tablet 0   levothyroxine (SYNTHROID) 175 MCG tablet Take 175 mcg by mouth every morning.     levothyroxine (SYNTHROID, LEVOTHROID) 150 MCG tablet Take 150 mcg by mouth daily before breakfast.     lidocaine-prilocaine (EMLA) cream      memantine (NAMENDA) 10 MG tablet TAKE 1 TABLET BY MOUTH TWICE A DAY 180 tablet 1   multivitamin (RENA-VIT) TABS tablet Take 1 tablet by mouth daily.     Tuberculin PPD (TUBERSOL ID) Inject into the skin.     No facility-administered medications prior to visit.    PAST MEDICAL HISTORY: Past Medical History:  Diagnosis Date   Anemia    low iron   BPH (benign prostatic hyperplasia)    CAD (coronary artery disease)    Carotid artery stenosis    1-39% bilateral carotid artery stenosis and < 50% stenosis in the right CCA by dopplers 06/2017   CKD (chronic kidney disease), stage III (HCC)    Stage 4   Diastolic dysfunction    Glaucoma    Graves disease    History of ETOH abuse    Hyperlipemia    Hypertension    Hyperthyroidism 08/26/10   radioactive iodine therapy    Memory loss    Mitral regurgitation echo 2015   mild   Multiple thyroid  nodules    MVP (mitral valve prolapse) 11/2012   posterior MVP   OSA (obstructive sleep apnea)    upper airway resistance syndrome with RDI 18/hr - not on CPAP due to insurance not covering   Pre-diabetes    PUD (peptic ulcer disease)    Pulmonary hypertension ([place]) echo 2015   Group 2 with pulmonary venous HTN and Group 3 with OSA   Upper airway resistance syndrome     PAST SURGICAL HISTORY: Past Surgical History:  Procedure Laterality Date   APPENDECTOMY     AV FISTULA PLACEMENT Left  10/18/2017   Procedure: ARTERIOVENOUS (AV) FISTULA CREATION ARM;  Surgeon: Waynetta Sandy, MD;  Location: [facility];  Service: Vascular;  Laterality: Left;   CARDIAC CATHETERIZATION     CARDIAC CATHETERIZATION N/A 02/17/2015   Procedure: Right Heart Cath;  Surgeon: Larey Dresser, MD;  Location: [facility];  Service: Cardiovascular;  Laterality: N/A;   CARDIOVERSION N/A 02/08/2019   Procedure: CARDIOVERSION;  Surgeon: Donato Heinz, MD;  Location: [facility];  Service: Endoscopy;  Laterality: N/A;   ESOPHAGOGASTRODUODENOSCOPY (EGD) WITH PROPOFOL Left 10/16/2016   Procedure: ESOPHAGOGASTRODUODENOSCOPY (EGD) WITH PROPOFOL;  Surgeon: Ronnette Juniper, MD;  Location: [facility];  Service: Gastroenterology;  Laterality: Left;   heart catherization     HERNIA REPAIR  10/2009   IR RADIOLOGIST EVAL & MGMT  09/28/2016   IR RADIOLOGIST EVAL & MGMT  10/19/2016   IR RADIOLOGIST EVAL & MGMT  01/18/2017   PACEMAKER IMPLANT N/A 02/09/2019   Procedure: PACEMAKER IMPLANT;  Surgeon: Constance Haw, MD;  Location: [facility];  Service: Cardiovascular;  Laterality: N/A;   RIGHT HEART CATH N/A 07/28/2016   Procedure: Right Heart Cath;  Surgeon: Larey Dresser, MD;  Location: [facility];  Service: Cardiovascular;  Laterality: N/A;    FAMILY HISTORY: Family History  Problem Relation Age of Onset   Kidney failure Father    Hypertension Father    Colon cancer Mother    Colon  cancer Brother    Lung disease Brother    Colon cancer Maternal Uncle     SOCIAL HISTORY: Social History   Socioeconomic History   Marital status: Married    Spouse name: Not on file   Number of children: 4   Years of education: completed high school   Highest education level: Not on file  Occupational History   Occupation: Retired    Fish farm manager: RETIRED    Comment: [place] [employer]  Tobacco Use   Smoking status: Former    Packs/day: 2.00    Years: 40.00    Pack years: 80.00    Types: Cigarettes    Quit date: 03/29/1984    Years since quitting: 37.1   Smokeless tobacco: Never  Vaping Use   Vaping Use: Not on file  Substance and Sexual Activity   Alcohol use: No    Comment: quit in 1981   Drug use: No   Sexual activity: Not on file  Other Topics Concern   Not on file  Social History Narrative   Lives at home with his wife.   Right-handed.   Occasional use of caffeine.   Social Determinants of Health   Financial Resource Strain: Not on file  Food Insecurity: Not on file  Transportation Needs: Not on file  Physical Activity: Not on file  Stress: Not on file  Social Connections: Not on file  Intimate Partner Violence: Not on file   PHYSICAL EXAM  There were no vitals filed for this visit.  There is no height or weight on file to calculate BMI.  Generalized: Well developed, in no acute distress  MMSE - Mini Mental State Exam 07/19/2019 01/18/2019 09/18/2018  Orientation to time 0 2 1  Orientation to Place 5 4 5   Registration 3 3 3   Attention/ Calculation 2 0 0  Recall 0 1 0  Language- name 2 objects 2 2 2   Language- repeat 1 1 1   Language- follow 3 step command 2 3 3   Language- read & follow direction 1 1  1  Write a sentence 1 1 1   Copy design 0 1 0  Copy design-comments 4 animals - -  Total score 17 19 17     Neurological examination  Mentation: Alert, oriented to self and situation, history is equally provided by the patient and his daughter. Follows  all commands speech and language fluent Cranial nerve II-XII: Pupils were equal round reactive to light. Extraocular movements were full, visual field were full on confrontational test. Facial sensation and strength were normal. Head turning and shoulder shrug were normal and symmetric. Motor: The motor testing reveals 5 over 5 strength of all 4 extremities. Good symmetric motor tone is noted throughout. Left dialysis graft Sensory: Sensory testing is intact to soft touch on all 4 extremities. No evidence of extinction is noted.  Coordination: Cerebellar testing reveals good finger-nose-finger and heel-to-shin bilaterally.  Gait and station: Has to push off from seated position to stand, mildly forward leaning gait, uses cane in the hallway Reflexes: Deep tendon reflexes are symmetric and normal bilaterally.   DIAGNOSTIC DATA (LABS, IMAGING, TESTING) - I reviewed patient records, labs, notes, testing and imaging myself where available.  Lab Results  Component Value Date   WBC 8.0 08/13/2019   HGB 7.0 (L) 08/13/2019   HCT 23.3 (L) 08/13/2019   MCV 100.9 (H) 08/13/2019   PLT 193 08/13/2019      Component Value Date/Time   NA 140 08/13/2019 1810   NA 144 01/10/2019 0900   K 3.9 08/13/2019 1810   CL 101 08/13/2019 1810   CO2 27 08/13/2019 1810   GLUCOSE 90 08/13/2019 1810   BUN 54 (H) 08/13/2019 1810   BUN 84 (HH) 01/10/2019 0900   CREATININE 6.95 (H) 08/13/2019 1810   CREATININE 3.10 (H) 01/13/2017 0920   CALCIUM 9.1 08/13/2019 1810   PROT 7.2 07/28/2019 0623   PROT 7.3 12/21/2016 0914   ALBUMIN 3.7 07/28/2019 0623   ALBUMIN 4.5 12/21/2016 0914   AST 31 07/28/2019 0623   ALT 13 07/28/2019 0623   ALKPHOS 70 07/28/2019 0623   BILITOT 1.4 (H) 07/28/2019 0623   BILITOT 0.9 12/21/2016 0914   GFRNONAA 7 (L) 08/13/2019 1810   GFRNONAA 18 (L) 01/13/2017 0920   GFRAA 8 (L) 08/13/2019 1810   GFRAA 21 (L) 01/13/2017 0920   Lab Results  Component Value Date   CHOL 117 05/04/2019    HDL 49 05/04/2019   LDLCALC 56 05/04/2019   TRIG 61 05/04/2019   CHOLHDL 2.4 05/04/2019   Lab Results  Component Value Date   HGBA1C 5.5 09/19/2016   Lab Results  Component Value Date   VITAMINB12 769 07/25/2019   Lab Results  Component Value Date   TSH 1.549 07/25/2019      ASSESSMENT AND PLAN 88 y.o. year old male  has a past medical history of Anemia, BPH (benign prostatic hyperplasia), CAD (coronary artery disease), Carotid artery stenosis, CKD (chronic kidney disease), stage III ([place]), Diastolic dysfunction, Glaucoma, Graves disease, History of ETOH abuse, Hyperlipemia, Hypertension, Hyperthyroidism (08/26/10), Memory loss, Mitral regurgitation (echo 2015), Multiple thyroid nodules, MVP (mitral valve prolapse) (11/2012), OSA (obstructive sleep apnea), Pre-diabetes, PUD (peptic ulcer disease), Pulmonary hypertension ([place]) (echo 2015), and Upper airway resistance syndrome. here with:  1.  Dementia -Is overall stable, MMSE was 17/30, is starting hemodialysis -Continue Namenda 10 mg twice a day -Continue Aricept 10 mg at bedtime -Weight loss of 20 lbs since last seen, likely due to chronic illness, and COVID infection, but should be monitored as possible  side effect Aricept -Will continue follow-up with primary doctor, follow-up at our office on an as-needed basis  Jeffrey Dakin, DNP 05/18/2021, 8:42 PM [facility] [address] [place], [place] [address] [phone]

## 2021-05-19 ENCOUNTER — Telehealth: Payer: Self-pay | Admitting: Neurology

## 2021-05-19 ENCOUNTER — Ambulatory Visit: Payer: Medicare Other | Admitting: Neurology

## 2021-05-19 NOTE — Telephone Encounter (Signed)
Pt's cancelled appt due forgot appt, unable to come.

## 2021-05-20 DIAGNOSIS — N186 End stage renal disease: Secondary | ICD-10-CM | POA: Diagnosis not present

## 2021-05-20 DIAGNOSIS — Z992 Dependence on renal dialysis: Secondary | ICD-10-CM | POA: Diagnosis not present

## 2021-05-20 DIAGNOSIS — D631 Anemia in chronic kidney disease: Secondary | ICD-10-CM | POA: Diagnosis not present

## 2021-05-20 DIAGNOSIS — N2581 Secondary hyperparathyroidism of renal origin: Secondary | ICD-10-CM | POA: Diagnosis not present

## 2021-05-22 DIAGNOSIS — N186 End stage renal disease: Secondary | ICD-10-CM | POA: Diagnosis not present

## 2021-05-22 DIAGNOSIS — N2581 Secondary hyperparathyroidism of renal origin: Secondary | ICD-10-CM | POA: Diagnosis not present

## 2021-05-22 DIAGNOSIS — Z992 Dependence on renal dialysis: Secondary | ICD-10-CM | POA: Diagnosis not present

## 2021-05-22 DIAGNOSIS — D631 Anemia in chronic kidney disease: Secondary | ICD-10-CM | POA: Diagnosis not present

## 2021-05-25 DIAGNOSIS — N186 End stage renal disease: Secondary | ICD-10-CM | POA: Diagnosis not present

## 2021-05-25 DIAGNOSIS — D631 Anemia in chronic kidney disease: Secondary | ICD-10-CM | POA: Diagnosis not present

## 2021-05-25 DIAGNOSIS — N2581 Secondary hyperparathyroidism of renal origin: Secondary | ICD-10-CM | POA: Diagnosis not present

## 2021-05-25 DIAGNOSIS — Z992 Dependence on renal dialysis: Secondary | ICD-10-CM | POA: Diagnosis not present

## 2021-05-27 DIAGNOSIS — N186 End stage renal disease: Secondary | ICD-10-CM | POA: Diagnosis not present

## 2021-05-27 DIAGNOSIS — Z992 Dependence on renal dialysis: Secondary | ICD-10-CM | POA: Diagnosis not present

## 2021-05-27 DIAGNOSIS — N269 Renal sclerosis, unspecified: Secondary | ICD-10-CM | POA: Diagnosis not present

## 2021-05-27 DIAGNOSIS — N2581 Secondary hyperparathyroidism of renal origin: Secondary | ICD-10-CM | POA: Diagnosis not present

## 2021-05-27 DIAGNOSIS — D631 Anemia in chronic kidney disease: Secondary | ICD-10-CM | POA: Diagnosis not present

## 2021-05-29 DIAGNOSIS — D631 Anemia in chronic kidney disease: Secondary | ICD-10-CM | POA: Diagnosis not present

## 2021-05-29 DIAGNOSIS — N186 End stage renal disease: Secondary | ICD-10-CM | POA: Diagnosis not present

## 2021-05-29 DIAGNOSIS — N2581 Secondary hyperparathyroidism of renal origin: Secondary | ICD-10-CM | POA: Diagnosis not present

## 2021-05-29 DIAGNOSIS — Z992 Dependence on renal dialysis: Secondary | ICD-10-CM | POA: Diagnosis not present

## 2021-06-01 DIAGNOSIS — D631 Anemia in chronic kidney disease: Secondary | ICD-10-CM | POA: Diagnosis not present

## 2021-06-01 DIAGNOSIS — N2581 Secondary hyperparathyroidism of renal origin: Secondary | ICD-10-CM | POA: Diagnosis not present

## 2021-06-01 DIAGNOSIS — Z992 Dependence on renal dialysis: Secondary | ICD-10-CM | POA: Diagnosis not present

## 2021-06-01 DIAGNOSIS — N186 End stage renal disease: Secondary | ICD-10-CM | POA: Diagnosis not present

## 2021-06-02 ENCOUNTER — Ambulatory Visit (INDEPENDENT_AMBULATORY_CARE_PROVIDER_SITE_OTHER): Payer: Medicare Other | Admitting: Podiatry

## 2021-06-02 ENCOUNTER — Other Ambulatory Visit: Payer: Self-pay

## 2021-06-02 DIAGNOSIS — M79674 Pain in right toe(s): Secondary | ICD-10-CM

## 2021-06-02 DIAGNOSIS — M79675 Pain in left toe(s): Secondary | ICD-10-CM

## 2021-06-02 DIAGNOSIS — B351 Tinea unguium: Secondary | ICD-10-CM | POA: Diagnosis not present

## 2021-06-03 DIAGNOSIS — N186 End stage renal disease: Secondary | ICD-10-CM | POA: Diagnosis not present

## 2021-06-03 DIAGNOSIS — N2581 Secondary hyperparathyroidism of renal origin: Secondary | ICD-10-CM | POA: Diagnosis not present

## 2021-06-03 DIAGNOSIS — Z992 Dependence on renal dialysis: Secondary | ICD-10-CM | POA: Diagnosis not present

## 2021-06-03 DIAGNOSIS — D631 Anemia in chronic kidney disease: Secondary | ICD-10-CM | POA: Diagnosis not present

## 2021-06-05 DIAGNOSIS — N2581 Secondary hyperparathyroidism of renal origin: Secondary | ICD-10-CM | POA: Diagnosis not present

## 2021-06-05 DIAGNOSIS — D631 Anemia in chronic kidney disease: Secondary | ICD-10-CM | POA: Diagnosis not present

## 2021-06-05 DIAGNOSIS — Z992 Dependence on renal dialysis: Secondary | ICD-10-CM | POA: Diagnosis not present

## 2021-06-05 DIAGNOSIS — N186 End stage renal disease: Secondary | ICD-10-CM | POA: Diagnosis not present

## 2021-06-08 DIAGNOSIS — N2581 Secondary hyperparathyroidism of renal origin: Secondary | ICD-10-CM | POA: Diagnosis not present

## 2021-06-08 DIAGNOSIS — D631 Anemia in chronic kidney disease: Secondary | ICD-10-CM | POA: Diagnosis not present

## 2021-06-08 DIAGNOSIS — Z992 Dependence on renal dialysis: Secondary | ICD-10-CM | POA: Diagnosis not present

## 2021-06-08 DIAGNOSIS — N186 End stage renal disease: Secondary | ICD-10-CM | POA: Diagnosis not present

## 2021-06-09 NOTE — Progress Notes (Signed)
Subjective: ?Jeffrey Campbell is a 88 y.o. male patient seen today for follow up of  at risk foot care. Patient has h/o ESRD on hemodialysis and painful elongated mycotic toenails 1-5 bilaterally which are tender when wearing enclosed shoe gear. Pain is relieved with periodic professional debridement..  ? ?New problem(s)/concern(s) today: None   ? ?Allergies  ?Allergen Reactions  ? Iodinated Contrast Media Other (See Comments)  ?  Right heart cath 07/2016 allegedly "shut down his kidneys" ?Patient has stage III chronic kidney disease  ? Zocor [Simvastatin] Other (See Comments)  ?  Liver problems   ? Calcitriol Other (See Comments)  ? Budesonide-Formoterol Fumarate Other (See Comments)  ?  Dry mouth  ? Minoxidil Other (See Comments)  ?  Unknown raction  ? Tiotropium Bromide Monohydrate Other (See Comments)  ?  Dry mouth  ? Tricor [Fenofibrate]   ?  Unknown reaction  ?Objective: ?Physical Exam ? ?General: Patient is a pleasant 88 y.o. African American male WD, WN in NAD. AAO x 3.  ? ?Neurovascular Examination: ?CFT <3 seconds b/l LE. Faintly palpable pedal pulses b/l LE. Pedal hair sparse b/l LE. Skin temperature gradient WNL b/l. No pain with calf compression b/l. No edema b/l LE. No cyanosis or clubbing noted b/l LE. ? ?Protective sensation intact 5/5 intact bilaterally with 10g monofilament b/l. Vibratory sensation intact b/l. ? ?Dermatological:  ?Pedal integument with normal turgor, texture and tone b/l LE. No open wounds b/l. No interdigital macerations b/l. Toenails 1-5 b/l elongated, thickened, discolored with subungual debris. +Tenderness with dorsal palpation of nailplates. No hyperkeratotic or porokeratotic lesions present. ? ?Musculoskeletal:  ?Normal muscle strength 5/5 to all lower extremity muscle groups bilaterally. No pain, crepitus or joint limitation noted with ROM b/l LE. No gross bony pedal deformities b/l. Patient ambulates independently without assistive aids. ? ?Assessment: ?1. Pain due to  onychomycosis of toenails of both feet   ? ?Plan: ?Patient was evaluated and treated and all questions answered. ?Consent given for treatment as described below: ?-Examined patient. ?-Patient to continue soft, supportive shoe gear daily. ?-Mycotic toenails 1-5 bilaterally were debrided in length and girth with sterile nail nippers and dremel without incident. ?-Patient/POA to call should there be question/concern in the interim. ? ?No follow-ups on file. ? ?Marzetta Board, DPM ?

## 2021-06-10 DIAGNOSIS — N2581 Secondary hyperparathyroidism of renal origin: Secondary | ICD-10-CM | POA: Diagnosis not present

## 2021-06-10 DIAGNOSIS — D631 Anemia in chronic kidney disease: Secondary | ICD-10-CM | POA: Diagnosis not present

## 2021-06-10 DIAGNOSIS — N186 End stage renal disease: Secondary | ICD-10-CM | POA: Diagnosis not present

## 2021-06-10 DIAGNOSIS — Z992 Dependence on renal dialysis: Secondary | ICD-10-CM | POA: Diagnosis not present

## 2021-06-12 DIAGNOSIS — N2581 Secondary hyperparathyroidism of renal origin: Secondary | ICD-10-CM | POA: Diagnosis not present

## 2021-06-12 DIAGNOSIS — D631 Anemia in chronic kidney disease: Secondary | ICD-10-CM | POA: Diagnosis not present

## 2021-06-12 DIAGNOSIS — N186 End stage renal disease: Secondary | ICD-10-CM | POA: Diagnosis not present

## 2021-06-12 DIAGNOSIS — Z992 Dependence on renal dialysis: Secondary | ICD-10-CM | POA: Diagnosis not present

## 2021-06-15 DIAGNOSIS — N186 End stage renal disease: Secondary | ICD-10-CM | POA: Diagnosis not present

## 2021-06-15 DIAGNOSIS — Z992 Dependence on renal dialysis: Secondary | ICD-10-CM | POA: Diagnosis not present

## 2021-06-15 DIAGNOSIS — N2581 Secondary hyperparathyroidism of renal origin: Secondary | ICD-10-CM | POA: Diagnosis not present

## 2021-06-15 DIAGNOSIS — D631 Anemia in chronic kidney disease: Secondary | ICD-10-CM | POA: Diagnosis not present

## 2021-06-17 DIAGNOSIS — D631 Anemia in chronic kidney disease: Secondary | ICD-10-CM | POA: Diagnosis not present

## 2021-06-17 DIAGNOSIS — Z992 Dependence on renal dialysis: Secondary | ICD-10-CM | POA: Diagnosis not present

## 2021-06-17 DIAGNOSIS — N2581 Secondary hyperparathyroidism of renal origin: Secondary | ICD-10-CM | POA: Diagnosis not present

## 2021-06-17 DIAGNOSIS — N186 End stage renal disease: Secondary | ICD-10-CM | POA: Diagnosis not present

## 2021-06-19 DIAGNOSIS — N2581 Secondary hyperparathyroidism of renal origin: Secondary | ICD-10-CM | POA: Diagnosis not present

## 2021-06-19 DIAGNOSIS — D631 Anemia in chronic kidney disease: Secondary | ICD-10-CM | POA: Diagnosis not present

## 2021-06-19 DIAGNOSIS — N186 End stage renal disease: Secondary | ICD-10-CM | POA: Diagnosis not present

## 2021-06-19 DIAGNOSIS — Z992 Dependence on renal dialysis: Secondary | ICD-10-CM | POA: Diagnosis not present

## 2021-06-22 DIAGNOSIS — D631 Anemia in chronic kidney disease: Secondary | ICD-10-CM | POA: Diagnosis not present

## 2021-06-22 DIAGNOSIS — Z992 Dependence on renal dialysis: Secondary | ICD-10-CM | POA: Diagnosis not present

## 2021-06-22 DIAGNOSIS — N186 End stage renal disease: Secondary | ICD-10-CM | POA: Diagnosis not present

## 2021-06-22 DIAGNOSIS — N2581 Secondary hyperparathyroidism of renal origin: Secondary | ICD-10-CM | POA: Diagnosis not present

## 2021-06-23 DIAGNOSIS — Z20822 Contact with and (suspected) exposure to covid-19: Secondary | ICD-10-CM | POA: Diagnosis not present

## 2021-06-24 DIAGNOSIS — N2581 Secondary hyperparathyroidism of renal origin: Secondary | ICD-10-CM | POA: Diagnosis not present

## 2021-06-24 DIAGNOSIS — N186 End stage renal disease: Secondary | ICD-10-CM | POA: Diagnosis not present

## 2021-06-24 DIAGNOSIS — D631 Anemia in chronic kidney disease: Secondary | ICD-10-CM | POA: Diagnosis not present

## 2021-06-24 DIAGNOSIS — Z992 Dependence on renal dialysis: Secondary | ICD-10-CM | POA: Diagnosis not present

## 2021-06-26 DIAGNOSIS — D631 Anemia in chronic kidney disease: Secondary | ICD-10-CM | POA: Diagnosis not present

## 2021-06-26 DIAGNOSIS — N186 End stage renal disease: Secondary | ICD-10-CM | POA: Diagnosis not present

## 2021-06-26 DIAGNOSIS — Z992 Dependence on renal dialysis: Secondary | ICD-10-CM | POA: Diagnosis not present

## 2021-06-26 DIAGNOSIS — N2581 Secondary hyperparathyroidism of renal origin: Secondary | ICD-10-CM | POA: Diagnosis not present

## 2021-06-27 DIAGNOSIS — N269 Renal sclerosis, unspecified: Secondary | ICD-10-CM | POA: Diagnosis not present

## 2021-06-27 DIAGNOSIS — N186 End stage renal disease: Secondary | ICD-10-CM | POA: Diagnosis not present

## 2021-06-27 DIAGNOSIS — Z992 Dependence on renal dialysis: Secondary | ICD-10-CM | POA: Diagnosis not present

## 2021-06-29 DIAGNOSIS — N186 End stage renal disease: Secondary | ICD-10-CM | POA: Diagnosis not present

## 2021-06-29 DIAGNOSIS — N2581 Secondary hyperparathyroidism of renal origin: Secondary | ICD-10-CM | POA: Diagnosis not present

## 2021-06-29 DIAGNOSIS — Z992 Dependence on renal dialysis: Secondary | ICD-10-CM | POA: Diagnosis not present

## 2021-07-01 DIAGNOSIS — N2581 Secondary hyperparathyroidism of renal origin: Secondary | ICD-10-CM | POA: Diagnosis not present

## 2021-07-01 DIAGNOSIS — N186 End stage renal disease: Secondary | ICD-10-CM | POA: Diagnosis not present

## 2021-07-01 DIAGNOSIS — Z992 Dependence on renal dialysis: Secondary | ICD-10-CM | POA: Diagnosis not present

## 2021-07-03 DIAGNOSIS — N2581 Secondary hyperparathyroidism of renal origin: Secondary | ICD-10-CM | POA: Diagnosis not present

## 2021-07-03 DIAGNOSIS — N186 End stage renal disease: Secondary | ICD-10-CM | POA: Diagnosis not present

## 2021-07-03 DIAGNOSIS — Z992 Dependence on renal dialysis: Secondary | ICD-10-CM | POA: Diagnosis not present

## 2021-07-06 DIAGNOSIS — N186 End stage renal disease: Secondary | ICD-10-CM | POA: Diagnosis not present

## 2021-07-06 DIAGNOSIS — Z992 Dependence on renal dialysis: Secondary | ICD-10-CM | POA: Diagnosis not present

## 2021-07-06 DIAGNOSIS — N2581 Secondary hyperparathyroidism of renal origin: Secondary | ICD-10-CM | POA: Diagnosis not present

## 2021-07-08 DIAGNOSIS — Z992 Dependence on renal dialysis: Secondary | ICD-10-CM | POA: Diagnosis not present

## 2021-07-08 DIAGNOSIS — N186 End stage renal disease: Secondary | ICD-10-CM | POA: Diagnosis not present

## 2021-07-08 DIAGNOSIS — N2581 Secondary hyperparathyroidism of renal origin: Secondary | ICD-10-CM | POA: Diagnosis not present

## 2021-07-10 DIAGNOSIS — N2581 Secondary hyperparathyroidism of renal origin: Secondary | ICD-10-CM | POA: Diagnosis not present

## 2021-07-10 DIAGNOSIS — N186 End stage renal disease: Secondary | ICD-10-CM | POA: Diagnosis not present

## 2021-07-10 DIAGNOSIS — Z992 Dependence on renal dialysis: Secondary | ICD-10-CM | POA: Diagnosis not present

## 2021-07-13 DIAGNOSIS — Z992 Dependence on renal dialysis: Secondary | ICD-10-CM | POA: Diagnosis not present

## 2021-07-13 DIAGNOSIS — N186 End stage renal disease: Secondary | ICD-10-CM | POA: Diagnosis not present

## 2021-07-13 DIAGNOSIS — N2581 Secondary hyperparathyroidism of renal origin: Secondary | ICD-10-CM | POA: Diagnosis not present

## 2021-07-15 DIAGNOSIS — N186 End stage renal disease: Secondary | ICD-10-CM | POA: Diagnosis not present

## 2021-07-15 DIAGNOSIS — Z992 Dependence on renal dialysis: Secondary | ICD-10-CM | POA: Diagnosis not present

## 2021-07-15 DIAGNOSIS — N2581 Secondary hyperparathyroidism of renal origin: Secondary | ICD-10-CM | POA: Diagnosis not present

## 2021-07-16 ENCOUNTER — Other Ambulatory Visit: Payer: Self-pay | Admitting: Neurology

## 2021-07-16 DIAGNOSIS — Z20822 Contact with and (suspected) exposure to covid-19: Secondary | ICD-10-CM | POA: Diagnosis not present

## 2021-07-17 DIAGNOSIS — N186 End stage renal disease: Secondary | ICD-10-CM | POA: Diagnosis not present

## 2021-07-17 DIAGNOSIS — Z992 Dependence on renal dialysis: Secondary | ICD-10-CM | POA: Diagnosis not present

## 2021-07-17 DIAGNOSIS — N2581 Secondary hyperparathyroidism of renal origin: Secondary | ICD-10-CM | POA: Diagnosis not present

## 2021-07-20 DIAGNOSIS — N2581 Secondary hyperparathyroidism of renal origin: Secondary | ICD-10-CM | POA: Diagnosis not present

## 2021-07-20 DIAGNOSIS — N186 End stage renal disease: Secondary | ICD-10-CM | POA: Diagnosis not present

## 2021-07-20 DIAGNOSIS — Z992 Dependence on renal dialysis: Secondary | ICD-10-CM | POA: Diagnosis not present

## 2021-07-22 ENCOUNTER — Other Ambulatory Visit: Payer: Self-pay | Admitting: Neurology

## 2021-07-22 DIAGNOSIS — Z992 Dependence on renal dialysis: Secondary | ICD-10-CM | POA: Diagnosis not present

## 2021-07-22 DIAGNOSIS — N2581 Secondary hyperparathyroidism of renal origin: Secondary | ICD-10-CM | POA: Diagnosis not present

## 2021-07-22 DIAGNOSIS — N186 End stage renal disease: Secondary | ICD-10-CM | POA: Diagnosis not present

## 2021-07-24 DIAGNOSIS — N2581 Secondary hyperparathyroidism of renal origin: Secondary | ICD-10-CM | POA: Diagnosis not present

## 2021-07-24 DIAGNOSIS — N186 End stage renal disease: Secondary | ICD-10-CM | POA: Diagnosis not present

## 2021-07-24 DIAGNOSIS — Z992 Dependence on renal dialysis: Secondary | ICD-10-CM | POA: Diagnosis not present

## 2021-07-26 ENCOUNTER — Other Ambulatory Visit: Payer: Self-pay | Admitting: Neurology

## 2021-07-27 DIAGNOSIS — N186 End stage renal disease: Secondary | ICD-10-CM | POA: Diagnosis not present

## 2021-07-27 DIAGNOSIS — Z20822 Contact with and (suspected) exposure to covid-19: Secondary | ICD-10-CM | POA: Diagnosis not present

## 2021-07-27 DIAGNOSIS — N2581 Secondary hyperparathyroidism of renal origin: Secondary | ICD-10-CM | POA: Diagnosis not present

## 2021-07-27 DIAGNOSIS — Z992 Dependence on renal dialysis: Secondary | ICD-10-CM | POA: Diagnosis not present

## 2021-07-27 DIAGNOSIS — D631 Anemia in chronic kidney disease: Secondary | ICD-10-CM | POA: Diagnosis not present

## 2021-07-27 DIAGNOSIS — N269 Renal sclerosis, unspecified: Secondary | ICD-10-CM | POA: Diagnosis not present

## 2021-07-29 DIAGNOSIS — D631 Anemia in chronic kidney disease: Secondary | ICD-10-CM | POA: Diagnosis not present

## 2021-07-29 DIAGNOSIS — N2581 Secondary hyperparathyroidism of renal origin: Secondary | ICD-10-CM | POA: Diagnosis not present

## 2021-07-29 DIAGNOSIS — N186 End stage renal disease: Secondary | ICD-10-CM | POA: Diagnosis not present

## 2021-07-29 DIAGNOSIS — Z992 Dependence on renal dialysis: Secondary | ICD-10-CM | POA: Diagnosis not present

## 2021-07-31 DIAGNOSIS — N186 End stage renal disease: Secondary | ICD-10-CM | POA: Diagnosis not present

## 2021-07-31 DIAGNOSIS — N2581 Secondary hyperparathyroidism of renal origin: Secondary | ICD-10-CM | POA: Diagnosis not present

## 2021-07-31 DIAGNOSIS — D631 Anemia in chronic kidney disease: Secondary | ICD-10-CM | POA: Diagnosis not present

## 2021-07-31 DIAGNOSIS — Z992 Dependence on renal dialysis: Secondary | ICD-10-CM | POA: Diagnosis not present

## 2021-08-03 DIAGNOSIS — N186 End stage renal disease: Secondary | ICD-10-CM | POA: Diagnosis not present

## 2021-08-03 DIAGNOSIS — N2581 Secondary hyperparathyroidism of renal origin: Secondary | ICD-10-CM | POA: Diagnosis not present

## 2021-08-03 DIAGNOSIS — D631 Anemia in chronic kidney disease: Secondary | ICD-10-CM | POA: Diagnosis not present

## 2021-08-03 DIAGNOSIS — Z992 Dependence on renal dialysis: Secondary | ICD-10-CM | POA: Diagnosis not present

## 2021-08-05 DIAGNOSIS — N186 End stage renal disease: Secondary | ICD-10-CM | POA: Diagnosis not present

## 2021-08-05 DIAGNOSIS — D631 Anemia in chronic kidney disease: Secondary | ICD-10-CM | POA: Diagnosis not present

## 2021-08-05 DIAGNOSIS — Z992 Dependence on renal dialysis: Secondary | ICD-10-CM | POA: Diagnosis not present

## 2021-08-05 DIAGNOSIS — N2581 Secondary hyperparathyroidism of renal origin: Secondary | ICD-10-CM | POA: Diagnosis not present

## 2021-08-07 DIAGNOSIS — D631 Anemia in chronic kidney disease: Secondary | ICD-10-CM | POA: Diagnosis not present

## 2021-08-07 DIAGNOSIS — N186 End stage renal disease: Secondary | ICD-10-CM | POA: Diagnosis not present

## 2021-08-07 DIAGNOSIS — N2581 Secondary hyperparathyroidism of renal origin: Secondary | ICD-10-CM | POA: Diagnosis not present

## 2021-08-07 DIAGNOSIS — Z992 Dependence on renal dialysis: Secondary | ICD-10-CM | POA: Diagnosis not present

## 2021-08-10 DIAGNOSIS — Z992 Dependence on renal dialysis: Secondary | ICD-10-CM | POA: Diagnosis not present

## 2021-08-10 DIAGNOSIS — D631 Anemia in chronic kidney disease: Secondary | ICD-10-CM | POA: Diagnosis not present

## 2021-08-10 DIAGNOSIS — N2581 Secondary hyperparathyroidism of renal origin: Secondary | ICD-10-CM | POA: Diagnosis not present

## 2021-08-10 DIAGNOSIS — N186 End stage renal disease: Secondary | ICD-10-CM | POA: Diagnosis not present

## 2021-08-12 DIAGNOSIS — D631 Anemia in chronic kidney disease: Secondary | ICD-10-CM | POA: Diagnosis not present

## 2021-08-12 DIAGNOSIS — Z992 Dependence on renal dialysis: Secondary | ICD-10-CM | POA: Diagnosis not present

## 2021-08-12 DIAGNOSIS — N186 End stage renal disease: Secondary | ICD-10-CM | POA: Diagnosis not present

## 2021-08-12 DIAGNOSIS — N2581 Secondary hyperparathyroidism of renal origin: Secondary | ICD-10-CM | POA: Diagnosis not present

## 2021-08-14 ENCOUNTER — Ambulatory Visit (INDEPENDENT_AMBULATORY_CARE_PROVIDER_SITE_OTHER): Payer: Medicare Other

## 2021-08-14 DIAGNOSIS — N2581 Secondary hyperparathyroidism of renal origin: Secondary | ICD-10-CM | POA: Diagnosis not present

## 2021-08-14 DIAGNOSIS — I442 Atrioventricular block, complete: Secondary | ICD-10-CM

## 2021-08-14 DIAGNOSIS — N186 End stage renal disease: Secondary | ICD-10-CM | POA: Diagnosis not present

## 2021-08-14 DIAGNOSIS — D631 Anemia in chronic kidney disease: Secondary | ICD-10-CM | POA: Diagnosis not present

## 2021-08-14 DIAGNOSIS — Z992 Dependence on renal dialysis: Secondary | ICD-10-CM | POA: Diagnosis not present

## 2021-08-14 LAB — CUP PACEART REMOTE DEVICE CHECK
Battery Remaining Longevity: 92 mo
Battery Remaining Percentage: 73 %
Battery Voltage: 2.99 V
Brady Statistic AP VP Percent: 89 %
Brady Statistic AP VS Percent: 1 %
Brady Statistic AS VP Percent: 8.7 %
Brady Statistic AS VS Percent: 2.1 %
Brady Statistic RA Percent Paced: 7.7 %
Brady Statistic RV Percent Paced: 99 %
Date Time Interrogation Session: 20230519020015
Implantable Lead Implant Date: 20201113
Implantable Lead Implant Date: 20201113
Implantable Lead Location: 753859
Implantable Lead Location: 753860
Implantable Pulse Generator Implant Date: 20201113
Lead Channel Impedance Value: 460 Ohm
Lead Channel Impedance Value: 480 Ohm
Lead Channel Pacing Threshold Amplitude: 0.625 V
Lead Channel Pacing Threshold Amplitude: 0.75 V
Lead Channel Pacing Threshold Pulse Width: 0.4 ms
Lead Channel Pacing Threshold Pulse Width: 0.4 ms
Lead Channel Sensing Intrinsic Amplitude: 5 mV
Lead Channel Sensing Intrinsic Amplitude: 8.2 mV
Lead Channel Setting Pacing Amplitude: 1 V
Lead Channel Setting Pacing Amplitude: 1.625
Lead Channel Setting Pacing Pulse Width: 0.4 ms
Lead Channel Setting Sensing Sensitivity: 2 mV
Pulse Gen Model: 2272
Pulse Gen Serial Number: 9178446

## 2021-08-17 ENCOUNTER — Telehealth: Payer: Self-pay | Admitting: Neurology

## 2021-08-17 DIAGNOSIS — Z992 Dependence on renal dialysis: Secondary | ICD-10-CM | POA: Diagnosis not present

## 2021-08-17 DIAGNOSIS — N186 End stage renal disease: Secondary | ICD-10-CM | POA: Diagnosis not present

## 2021-08-17 DIAGNOSIS — D631 Anemia in chronic kidney disease: Secondary | ICD-10-CM | POA: Diagnosis not present

## 2021-08-17 DIAGNOSIS — N2581 Secondary hyperparathyroidism of renal origin: Secondary | ICD-10-CM | POA: Diagnosis not present

## 2021-08-17 MED ORDER — DONEPEZIL HCL 10 MG PO TABS: 10.0000 mg | ORAL_TABLET | Freq: Every day | ORAL | 0 refills | Status: AC

## 2021-08-17 NOTE — Telephone Encounter (Addendum)
Last seen 07/19/19 with notes to follow up prn, continue care with PCP. I spoke to his daughter who says [facility] is now declining to provide refills. She has scheduled the patient an appt 11/17/21 with Butler Denmark, NP. He missed his last visit. His daughter is aware we will need to see him to continue managing meds. 90-day rx for donepezil sent to pharmacy to avoid disruption to care. Says last refill for memantine was sent by the provider at the dialysis center.

## 2021-08-17 NOTE — Addendum Note (Signed)
Addended by: Noberto Retort C on: 08/17/2021 11:09 AM   Modules accepted: Orders

## 2021-08-17 NOTE — Telephone Encounter (Signed)
Patient needs a refill for Donepezil. Patients daughter tried calling but she was on hold sop she came into the lobby. [facility] [address] [facility]. If you have any questions please call [name]  daughter in the [place] as well [phone].

## 2021-08-19 DIAGNOSIS — Z992 Dependence on renal dialysis: Secondary | ICD-10-CM | POA: Diagnosis not present

## 2021-08-19 DIAGNOSIS — N2581 Secondary hyperparathyroidism of renal origin: Secondary | ICD-10-CM | POA: Diagnosis not present

## 2021-08-19 DIAGNOSIS — D631 Anemia in chronic kidney disease: Secondary | ICD-10-CM | POA: Diagnosis not present

## 2021-08-19 DIAGNOSIS — N186 End stage renal disease: Secondary | ICD-10-CM | POA: Diagnosis not present

## 2021-08-20 NOTE — Progress Notes (Signed)
Remote pacemaker transmission.   

## 2021-08-21 DIAGNOSIS — N186 End stage renal disease: Secondary | ICD-10-CM | POA: Diagnosis not present

## 2021-08-21 DIAGNOSIS — D631 Anemia in chronic kidney disease: Secondary | ICD-10-CM | POA: Diagnosis not present

## 2021-08-21 DIAGNOSIS — Z992 Dependence on renal dialysis: Secondary | ICD-10-CM | POA: Diagnosis not present

## 2021-08-21 DIAGNOSIS — N2581 Secondary hyperparathyroidism of renal origin: Secondary | ICD-10-CM | POA: Diagnosis not present

## 2021-08-24 DIAGNOSIS — Z992 Dependence on renal dialysis: Secondary | ICD-10-CM | POA: Diagnosis not present

## 2021-08-24 DIAGNOSIS — N2581 Secondary hyperparathyroidism of renal origin: Secondary | ICD-10-CM | POA: Diagnosis not present

## 2021-08-24 DIAGNOSIS — D631 Anemia in chronic kidney disease: Secondary | ICD-10-CM | POA: Diagnosis not present

## 2021-08-24 DIAGNOSIS — N186 End stage renal disease: Secondary | ICD-10-CM | POA: Diagnosis not present

## 2021-08-26 DIAGNOSIS — N2581 Secondary hyperparathyroidism of renal origin: Secondary | ICD-10-CM | POA: Diagnosis not present

## 2021-08-26 DIAGNOSIS — D631 Anemia in chronic kidney disease: Secondary | ICD-10-CM | POA: Diagnosis not present

## 2021-08-26 DIAGNOSIS — N186 End stage renal disease: Secondary | ICD-10-CM | POA: Diagnosis not present

## 2021-08-26 DIAGNOSIS — Z992 Dependence on renal dialysis: Secondary | ICD-10-CM | POA: Diagnosis not present

## 2021-08-27 DIAGNOSIS — N186 End stage renal disease: Secondary | ICD-10-CM | POA: Diagnosis not present

## 2021-08-27 DIAGNOSIS — Z992 Dependence on renal dialysis: Secondary | ICD-10-CM | POA: Diagnosis not present

## 2021-08-27 DIAGNOSIS — N269 Renal sclerosis, unspecified: Secondary | ICD-10-CM | POA: Diagnosis not present

## 2021-08-28 DIAGNOSIS — N186 End stage renal disease: Secondary | ICD-10-CM | POA: Diagnosis not present

## 2021-08-28 DIAGNOSIS — Z992 Dependence on renal dialysis: Secondary | ICD-10-CM | POA: Diagnosis not present

## 2021-08-28 DIAGNOSIS — N2581 Secondary hyperparathyroidism of renal origin: Secondary | ICD-10-CM | POA: Diagnosis not present

## 2021-08-28 DIAGNOSIS — D631 Anemia in chronic kidney disease: Secondary | ICD-10-CM | POA: Diagnosis not present

## 2021-08-31 DIAGNOSIS — N186 End stage renal disease: Secondary | ICD-10-CM | POA: Diagnosis not present

## 2021-08-31 DIAGNOSIS — Z992 Dependence on renal dialysis: Secondary | ICD-10-CM | POA: Diagnosis not present

## 2021-08-31 DIAGNOSIS — N2581 Secondary hyperparathyroidism of renal origin: Secondary | ICD-10-CM | POA: Diagnosis not present

## 2021-08-31 DIAGNOSIS — D631 Anemia in chronic kidney disease: Secondary | ICD-10-CM | POA: Diagnosis not present

## 2021-09-02 DIAGNOSIS — D631 Anemia in chronic kidney disease: Secondary | ICD-10-CM | POA: Diagnosis not present

## 2021-09-02 DIAGNOSIS — N2581 Secondary hyperparathyroidism of renal origin: Secondary | ICD-10-CM | POA: Diagnosis not present

## 2021-09-02 DIAGNOSIS — N186 End stage renal disease: Secondary | ICD-10-CM | POA: Diagnosis not present

## 2021-09-02 DIAGNOSIS — Z992 Dependence on renal dialysis: Secondary | ICD-10-CM | POA: Diagnosis not present

## 2021-09-04 DIAGNOSIS — N186 End stage renal disease: Secondary | ICD-10-CM | POA: Diagnosis not present

## 2021-09-04 DIAGNOSIS — N2581 Secondary hyperparathyroidism of renal origin: Secondary | ICD-10-CM | POA: Diagnosis not present

## 2021-09-04 DIAGNOSIS — Z992 Dependence on renal dialysis: Secondary | ICD-10-CM | POA: Diagnosis not present

## 2021-09-04 DIAGNOSIS — D631 Anemia in chronic kidney disease: Secondary | ICD-10-CM | POA: Diagnosis not present

## 2021-09-07 DIAGNOSIS — N2581 Secondary hyperparathyroidism of renal origin: Secondary | ICD-10-CM | POA: Diagnosis not present

## 2021-09-07 DIAGNOSIS — Z992 Dependence on renal dialysis: Secondary | ICD-10-CM | POA: Diagnosis not present

## 2021-09-07 DIAGNOSIS — N186 End stage renal disease: Secondary | ICD-10-CM | POA: Diagnosis not present

## 2021-09-07 DIAGNOSIS — D631 Anemia in chronic kidney disease: Secondary | ICD-10-CM | POA: Diagnosis not present

## 2021-09-08 ENCOUNTER — Ambulatory Visit (INDEPENDENT_AMBULATORY_CARE_PROVIDER_SITE_OTHER): Payer: Self-pay | Admitting: Podiatry

## 2021-09-08 DIAGNOSIS — Z91199 Patient's noncompliance with other medical treatment and regimen due to unspecified reason: Secondary | ICD-10-CM

## 2021-09-09 DIAGNOSIS — Z992 Dependence on renal dialysis: Secondary | ICD-10-CM | POA: Diagnosis not present

## 2021-09-09 DIAGNOSIS — N2581 Secondary hyperparathyroidism of renal origin: Secondary | ICD-10-CM | POA: Diagnosis not present

## 2021-09-09 DIAGNOSIS — D631 Anemia in chronic kidney disease: Secondary | ICD-10-CM | POA: Diagnosis not present

## 2021-09-09 DIAGNOSIS — N186 End stage renal disease: Secondary | ICD-10-CM | POA: Diagnosis not present

## 2021-09-11 DIAGNOSIS — N186 End stage renal disease: Secondary | ICD-10-CM | POA: Diagnosis not present

## 2021-09-11 DIAGNOSIS — N2581 Secondary hyperparathyroidism of renal origin: Secondary | ICD-10-CM | POA: Diagnosis not present

## 2021-09-11 DIAGNOSIS — D631 Anemia in chronic kidney disease: Secondary | ICD-10-CM | POA: Diagnosis not present

## 2021-09-11 DIAGNOSIS — Z992 Dependence on renal dialysis: Secondary | ICD-10-CM | POA: Diagnosis not present

## 2021-09-14 DIAGNOSIS — N186 End stage renal disease: Secondary | ICD-10-CM | POA: Diagnosis not present

## 2021-09-14 DIAGNOSIS — Z992 Dependence on renal dialysis: Secondary | ICD-10-CM | POA: Diagnosis not present

## 2021-09-14 DIAGNOSIS — N2581 Secondary hyperparathyroidism of renal origin: Secondary | ICD-10-CM | POA: Diagnosis not present

## 2021-09-14 DIAGNOSIS — D631 Anemia in chronic kidney disease: Secondary | ICD-10-CM | POA: Diagnosis not present

## 2021-09-14 NOTE — Progress Notes (Signed)
   Complete physical exam  Patient: Jeffrey Campbell   DOB: 01/16/1997   88 y.o. Male  MRN: [id number]  Subjective:    No chief complaint on file.   Jeffrey Campbell is a 88 y.o. male who presents today for a complete physical exam. She reports consuming a {diet types:17450} diet. {types:19826} She generally feels {DESC; WELL/FAIRLY WELL/POORLY:18703}. She reports sleeping {DESC; WELL/FAIRLY WELL/POORLY:18703}. She {does/does not:200015} have additional problems to discuss today.    Most recent fall risk assessment:    09/23/2021   10:42 AM  Fall Risk   Falls in the past year? 0  Number falls in past yr: 0  Injury with Fall? 0  Risk for fall due to : No Fall Risks  Follow up Falls evaluation completed     Most recent depression screenings:    09/23/2021   10:42 AM 08/14/2020   10:46 AM  PHQ 2/9 Scores  PHQ - 2 Score 0 0  PHQ- 9 Score 5     {VISON DENTAL STD PSA (Optional):27386}  {History (Optional):23778}  Patient Care Team: Jessup, Joy, NP as PCP - General (Nurse Practitioner)   Outpatient Medications Prior to Visit  Medication Sig   fluticasone (FLONASE) 50 MCG/ACT nasal spray Place 2 sprays into both nostrils in the morning and at bedtime. After 7 days, reduce to once daily.   norgestimate-ethinyl estradiol (SPRINTEC 28) 0.25-35 MG-MCG tablet Take 1 tablet by mouth daily.   Nystatin POWD Apply liberally to affected area 2 times per day   spironolactone (ALDACTONE) 100 MG tablet Take 1 tablet (100 mg total) by mouth daily.   No facility-administered medications prior to visit.    ROS        Objective:     There were no vitals taken for this visit. {Vitals History (Optional):23777}  Physical Exam   No results found for any visits on 10/29/21. {Show previous labs (optional):23779}    Assessment & Plan:    Routine Health Maintenance and Physical Exam  Immunization History  Administered Date(s) Administered   DTaP 04/01/1999, 05/28/1999,  08/06/1999, 04/21/2000, 11/05/2003   Hepatitis A 09/01/2007, 09/06/2008   Hepatitis B 01/17/1999, 02/24/1999, 08/06/1999   HiB (PRP-OMP) 04/01/1999, 05/28/1999, 08/06/1999, 04/21/2000   IPV 04/01/1999, 05/28/1999, 01/25/2000, 11/05/2003   Influenza,inj,Quad PF,6+ Mos 12/07/2013   Influenza-Unspecified 03/08/2012   MMR 01/24/2001, 11/05/2003   Meningococcal Polysaccharide 09/06/2011   Pneumococcal Conjugate-13 04/21/2000   Pneumococcal-Unspecified 08/06/1999, 10/20/1999   Tdap 09/06/2011   Varicella 01/25/2000, 09/01/2007    Health Maintenance  Topic Date Due   HIV Screening  Never done   Hepatitis C Screening  Never done   INFLUENZA VACCINE  10/27/2021   PAP-Cervical Cytology Screening  10/29/2021 (Originally 01/16/2020)   PAP SMEAR-Modifier  10/29/2021 (Originally 01/16/2020)   TETANUS/TDAP  10/29/2021 (Originally 09/05/2021)   HPV VACCINES  Discontinued   COVID-19 Vaccine  Discontinued    Discussed health benefits of physical activity, and encouraged her to engage in regular exercise appropriate for her age and condition.  Problem List Items Addressed This Visit   None Visit Diagnoses     Annual physical exam    -  Primary   Cervical cancer screening       Need for Tdap vaccination          No follow-ups on file.     Joy Jessup, NP   

## 2021-09-16 DIAGNOSIS — N2581 Secondary hyperparathyroidism of renal origin: Secondary | ICD-10-CM | POA: Diagnosis not present

## 2021-09-16 DIAGNOSIS — D631 Anemia in chronic kidney disease: Secondary | ICD-10-CM | POA: Diagnosis not present

## 2021-09-16 DIAGNOSIS — Z992 Dependence on renal dialysis: Secondary | ICD-10-CM | POA: Diagnosis not present

## 2021-09-16 DIAGNOSIS — N186 End stage renal disease: Secondary | ICD-10-CM | POA: Diagnosis not present

## 2021-09-18 DIAGNOSIS — D631 Anemia in chronic kidney disease: Secondary | ICD-10-CM | POA: Diagnosis not present

## 2021-09-18 DIAGNOSIS — N2581 Secondary hyperparathyroidism of renal origin: Secondary | ICD-10-CM | POA: Diagnosis not present

## 2021-09-18 DIAGNOSIS — Z992 Dependence on renal dialysis: Secondary | ICD-10-CM | POA: Diagnosis not present

## 2021-09-18 DIAGNOSIS — N186 End stage renal disease: Secondary | ICD-10-CM | POA: Diagnosis not present

## 2021-09-21 DIAGNOSIS — D631 Anemia in chronic kidney disease: Secondary | ICD-10-CM | POA: Diagnosis not present

## 2021-09-21 DIAGNOSIS — Z992 Dependence on renal dialysis: Secondary | ICD-10-CM | POA: Diagnosis not present

## 2021-09-21 DIAGNOSIS — N2581 Secondary hyperparathyroidism of renal origin: Secondary | ICD-10-CM | POA: Diagnosis not present

## 2021-09-21 DIAGNOSIS — N186 End stage renal disease: Secondary | ICD-10-CM | POA: Diagnosis not present

## 2021-09-23 DIAGNOSIS — Z992 Dependence on renal dialysis: Secondary | ICD-10-CM | POA: Diagnosis not present

## 2021-09-23 DIAGNOSIS — D631 Anemia in chronic kidney disease: Secondary | ICD-10-CM | POA: Diagnosis not present

## 2021-09-23 DIAGNOSIS — N186 End stage renal disease: Secondary | ICD-10-CM | POA: Diagnosis not present

## 2021-09-23 DIAGNOSIS — N2581 Secondary hyperparathyroidism of renal origin: Secondary | ICD-10-CM | POA: Diagnosis not present

## 2021-09-25 DIAGNOSIS — D631 Anemia in chronic kidney disease: Secondary | ICD-10-CM | POA: Diagnosis not present

## 2021-09-25 DIAGNOSIS — N186 End stage renal disease: Secondary | ICD-10-CM | POA: Diagnosis not present

## 2021-09-25 DIAGNOSIS — N2581 Secondary hyperparathyroidism of renal origin: Secondary | ICD-10-CM | POA: Diagnosis not present

## 2021-09-25 DIAGNOSIS — Z992 Dependence on renal dialysis: Secondary | ICD-10-CM | POA: Diagnosis not present

## 2021-09-26 DIAGNOSIS — N186 End stage renal disease: Secondary | ICD-10-CM | POA: Diagnosis not present

## 2021-09-26 DIAGNOSIS — Z992 Dependence on renal dialysis: Secondary | ICD-10-CM | POA: Diagnosis not present

## 2021-09-26 DIAGNOSIS — N269 Renal sclerosis, unspecified: Secondary | ICD-10-CM | POA: Diagnosis not present

## 2021-09-28 DIAGNOSIS — D631 Anemia in chronic kidney disease: Secondary | ICD-10-CM | POA: Diagnosis not present

## 2021-09-28 DIAGNOSIS — N2581 Secondary hyperparathyroidism of renal origin: Secondary | ICD-10-CM | POA: Diagnosis not present

## 2021-09-28 DIAGNOSIS — N186 End stage renal disease: Secondary | ICD-10-CM | POA: Diagnosis not present

## 2021-09-28 DIAGNOSIS — Z992 Dependence on renal dialysis: Secondary | ICD-10-CM | POA: Diagnosis not present

## 2021-09-30 DIAGNOSIS — D631 Anemia in chronic kidney disease: Secondary | ICD-10-CM | POA: Diagnosis not present

## 2021-09-30 DIAGNOSIS — N186 End stage renal disease: Secondary | ICD-10-CM | POA: Diagnosis not present

## 2021-09-30 DIAGNOSIS — Z992 Dependence on renal dialysis: Secondary | ICD-10-CM | POA: Diagnosis not present

## 2021-09-30 DIAGNOSIS — N2581 Secondary hyperparathyroidism of renal origin: Secondary | ICD-10-CM | POA: Diagnosis not present

## 2021-10-02 DIAGNOSIS — N186 End stage renal disease: Secondary | ICD-10-CM | POA: Diagnosis not present

## 2021-10-02 DIAGNOSIS — N2581 Secondary hyperparathyroidism of renal origin: Secondary | ICD-10-CM | POA: Diagnosis not present

## 2021-10-02 DIAGNOSIS — Z992 Dependence on renal dialysis: Secondary | ICD-10-CM | POA: Diagnosis not present

## 2021-10-02 DIAGNOSIS — D631 Anemia in chronic kidney disease: Secondary | ICD-10-CM | POA: Diagnosis not present

## 2021-10-05 DIAGNOSIS — N186 End stage renal disease: Secondary | ICD-10-CM | POA: Diagnosis not present

## 2021-10-05 DIAGNOSIS — N2581 Secondary hyperparathyroidism of renal origin: Secondary | ICD-10-CM | POA: Diagnosis not present

## 2021-10-05 DIAGNOSIS — D631 Anemia in chronic kidney disease: Secondary | ICD-10-CM | POA: Diagnosis not present

## 2021-10-05 DIAGNOSIS — Z992 Dependence on renal dialysis: Secondary | ICD-10-CM | POA: Diagnosis not present

## 2021-10-07 DIAGNOSIS — N186 End stage renal disease: Secondary | ICD-10-CM | POA: Diagnosis not present

## 2021-10-07 DIAGNOSIS — N2581 Secondary hyperparathyroidism of renal origin: Secondary | ICD-10-CM | POA: Diagnosis not present

## 2021-10-07 DIAGNOSIS — Z992 Dependence on renal dialysis: Secondary | ICD-10-CM | POA: Diagnosis not present

## 2021-10-07 DIAGNOSIS — D631 Anemia in chronic kidney disease: Secondary | ICD-10-CM | POA: Diagnosis not present

## 2021-10-09 DIAGNOSIS — N2581 Secondary hyperparathyroidism of renal origin: Secondary | ICD-10-CM | POA: Diagnosis not present

## 2021-10-09 DIAGNOSIS — Z992 Dependence on renal dialysis: Secondary | ICD-10-CM | POA: Diagnosis not present

## 2021-10-09 DIAGNOSIS — D631 Anemia in chronic kidney disease: Secondary | ICD-10-CM | POA: Diagnosis not present

## 2021-10-09 DIAGNOSIS — N186 End stage renal disease: Secondary | ICD-10-CM | POA: Diagnosis not present

## 2021-10-12 DIAGNOSIS — D631 Anemia in chronic kidney disease: Secondary | ICD-10-CM | POA: Diagnosis not present

## 2021-10-12 DIAGNOSIS — Z992 Dependence on renal dialysis: Secondary | ICD-10-CM | POA: Diagnosis not present

## 2021-10-12 DIAGNOSIS — N186 End stage renal disease: Secondary | ICD-10-CM | POA: Diagnosis not present

## 2021-10-12 DIAGNOSIS — N2581 Secondary hyperparathyroidism of renal origin: Secondary | ICD-10-CM | POA: Diagnosis not present

## 2021-10-14 DIAGNOSIS — D631 Anemia in chronic kidney disease: Secondary | ICD-10-CM | POA: Diagnosis not present

## 2021-10-14 DIAGNOSIS — N2581 Secondary hyperparathyroidism of renal origin: Secondary | ICD-10-CM | POA: Diagnosis not present

## 2021-10-14 DIAGNOSIS — N186 End stage renal disease: Secondary | ICD-10-CM | POA: Diagnosis not present

## 2021-10-14 DIAGNOSIS — Z992 Dependence on renal dialysis: Secondary | ICD-10-CM | POA: Diagnosis not present

## 2021-10-16 DIAGNOSIS — N2581 Secondary hyperparathyroidism of renal origin: Secondary | ICD-10-CM | POA: Diagnosis not present

## 2021-10-16 DIAGNOSIS — Z992 Dependence on renal dialysis: Secondary | ICD-10-CM | POA: Diagnosis not present

## 2021-10-16 DIAGNOSIS — D631 Anemia in chronic kidney disease: Secondary | ICD-10-CM | POA: Diagnosis not present

## 2021-10-16 DIAGNOSIS — N186 End stage renal disease: Secondary | ICD-10-CM | POA: Diagnosis not present

## 2021-10-19 DIAGNOSIS — N186 End stage renal disease: Secondary | ICD-10-CM | POA: Diagnosis not present

## 2021-10-19 DIAGNOSIS — D631 Anemia in chronic kidney disease: Secondary | ICD-10-CM | POA: Diagnosis not present

## 2021-10-19 DIAGNOSIS — N2581 Secondary hyperparathyroidism of renal origin: Secondary | ICD-10-CM | POA: Diagnosis not present

## 2021-10-19 DIAGNOSIS — Z992 Dependence on renal dialysis: Secondary | ICD-10-CM | POA: Diagnosis not present

## 2021-10-21 DIAGNOSIS — N186 End stage renal disease: Secondary | ICD-10-CM | POA: Diagnosis not present

## 2021-10-21 DIAGNOSIS — D631 Anemia in chronic kidney disease: Secondary | ICD-10-CM | POA: Diagnosis not present

## 2021-10-21 DIAGNOSIS — N2581 Secondary hyperparathyroidism of renal origin: Secondary | ICD-10-CM | POA: Diagnosis not present

## 2021-10-21 DIAGNOSIS — Z992 Dependence on renal dialysis: Secondary | ICD-10-CM | POA: Diagnosis not present

## 2021-10-22 ENCOUNTER — Other Ambulatory Visit (HOSPITAL_COMMUNITY): Payer: Self-pay | Admitting: Cardiology

## 2021-10-23 DIAGNOSIS — D631 Anemia in chronic kidney disease: Secondary | ICD-10-CM | POA: Diagnosis not present

## 2021-10-23 DIAGNOSIS — Z992 Dependence on renal dialysis: Secondary | ICD-10-CM | POA: Diagnosis not present

## 2021-10-23 DIAGNOSIS — N2581 Secondary hyperparathyroidism of renal origin: Secondary | ICD-10-CM | POA: Diagnosis not present

## 2021-10-23 DIAGNOSIS — N186 End stage renal disease: Secondary | ICD-10-CM | POA: Diagnosis not present

## 2021-10-26 DIAGNOSIS — Z992 Dependence on renal dialysis: Secondary | ICD-10-CM | POA: Diagnosis not present

## 2021-10-26 DIAGNOSIS — D631 Anemia in chronic kidney disease: Secondary | ICD-10-CM | POA: Diagnosis not present

## 2021-10-26 DIAGNOSIS — N186 End stage renal disease: Secondary | ICD-10-CM | POA: Diagnosis not present

## 2021-10-26 DIAGNOSIS — N2581 Secondary hyperparathyroidism of renal origin: Secondary | ICD-10-CM | POA: Diagnosis not present

## 2021-10-27 DIAGNOSIS — Z992 Dependence on renal dialysis: Secondary | ICD-10-CM | POA: Diagnosis not present

## 2021-10-27 DIAGNOSIS — N269 Renal sclerosis, unspecified: Secondary | ICD-10-CM | POA: Diagnosis not present

## 2021-10-27 DIAGNOSIS — N186 End stage renal disease: Secondary | ICD-10-CM | POA: Diagnosis not present

## 2021-10-28 DIAGNOSIS — D631 Anemia in chronic kidney disease: Secondary | ICD-10-CM | POA: Diagnosis not present

## 2021-10-28 DIAGNOSIS — Z992 Dependence on renal dialysis: Secondary | ICD-10-CM | POA: Diagnosis not present

## 2021-10-28 DIAGNOSIS — N189 Chronic kidney disease, unspecified: Secondary | ICD-10-CM | POA: Diagnosis not present

## 2021-10-28 DIAGNOSIS — N2581 Secondary hyperparathyroidism of renal origin: Secondary | ICD-10-CM | POA: Diagnosis not present

## 2021-10-28 DIAGNOSIS — N186 End stage renal disease: Secondary | ICD-10-CM | POA: Diagnosis not present

## 2021-10-30 DIAGNOSIS — N186 End stage renal disease: Secondary | ICD-10-CM | POA: Diagnosis not present

## 2021-10-30 DIAGNOSIS — D631 Anemia in chronic kidney disease: Secondary | ICD-10-CM | POA: Diagnosis not present

## 2021-10-30 DIAGNOSIS — N2581 Secondary hyperparathyroidism of renal origin: Secondary | ICD-10-CM | POA: Diagnosis not present

## 2021-10-30 DIAGNOSIS — N189 Chronic kidney disease, unspecified: Secondary | ICD-10-CM | POA: Diagnosis not present

## 2021-10-30 DIAGNOSIS — Z992 Dependence on renal dialysis: Secondary | ICD-10-CM | POA: Diagnosis not present

## 2021-11-02 DIAGNOSIS — Z992 Dependence on renal dialysis: Secondary | ICD-10-CM | POA: Diagnosis not present

## 2021-11-02 DIAGNOSIS — D631 Anemia in chronic kidney disease: Secondary | ICD-10-CM | POA: Diagnosis not present

## 2021-11-02 DIAGNOSIS — N189 Chronic kidney disease, unspecified: Secondary | ICD-10-CM | POA: Diagnosis not present

## 2021-11-02 DIAGNOSIS — N186 End stage renal disease: Secondary | ICD-10-CM | POA: Diagnosis not present

## 2021-11-02 DIAGNOSIS — N2581 Secondary hyperparathyroidism of renal origin: Secondary | ICD-10-CM | POA: Diagnosis not present

## 2021-11-04 DIAGNOSIS — Z992 Dependence on renal dialysis: Secondary | ICD-10-CM | POA: Diagnosis not present

## 2021-11-04 DIAGNOSIS — N2581 Secondary hyperparathyroidism of renal origin: Secondary | ICD-10-CM | POA: Diagnosis not present

## 2021-11-04 DIAGNOSIS — N189 Chronic kidney disease, unspecified: Secondary | ICD-10-CM | POA: Diagnosis not present

## 2021-11-04 DIAGNOSIS — N186 End stage renal disease: Secondary | ICD-10-CM | POA: Diagnosis not present

## 2021-11-04 DIAGNOSIS — D631 Anemia in chronic kidney disease: Secondary | ICD-10-CM | POA: Diagnosis not present

## 2021-11-06 DIAGNOSIS — Z992 Dependence on renal dialysis: Secondary | ICD-10-CM | POA: Diagnosis not present

## 2021-11-06 DIAGNOSIS — D631 Anemia in chronic kidney disease: Secondary | ICD-10-CM | POA: Diagnosis not present

## 2021-11-06 DIAGNOSIS — N189 Chronic kidney disease, unspecified: Secondary | ICD-10-CM | POA: Diagnosis not present

## 2021-11-06 DIAGNOSIS — N2581 Secondary hyperparathyroidism of renal origin: Secondary | ICD-10-CM | POA: Diagnosis not present

## 2021-11-06 DIAGNOSIS — N186 End stage renal disease: Secondary | ICD-10-CM | POA: Diagnosis not present

## 2021-11-07 ENCOUNTER — Other Ambulatory Visit (HOSPITAL_COMMUNITY): Payer: Self-pay | Admitting: Cardiology

## 2021-11-09 DIAGNOSIS — D631 Anemia in chronic kidney disease: Secondary | ICD-10-CM | POA: Diagnosis not present

## 2021-11-09 DIAGNOSIS — N186 End stage renal disease: Secondary | ICD-10-CM | POA: Diagnosis not present

## 2021-11-09 DIAGNOSIS — N189 Chronic kidney disease, unspecified: Secondary | ICD-10-CM | POA: Diagnosis not present

## 2021-11-09 DIAGNOSIS — Z992 Dependence on renal dialysis: Secondary | ICD-10-CM | POA: Diagnosis not present

## 2021-11-09 DIAGNOSIS — N2581 Secondary hyperparathyroidism of renal origin: Secondary | ICD-10-CM | POA: Diagnosis not present

## 2021-11-10 ENCOUNTER — Telehealth: Payer: Self-pay | Admitting: *Deleted

## 2021-11-10 DIAGNOSIS — E0500 Thyrotoxicosis with diffuse goiter without thyrotoxic crisis or storm: Secondary | ICD-10-CM | POA: Diagnosis not present

## 2021-11-10 DIAGNOSIS — E890 Postprocedural hypothyroidism: Secondary | ICD-10-CM | POA: Diagnosis not present

## 2021-11-10 DIAGNOSIS — Z992 Dependence on renal dialysis: Secondary | ICD-10-CM | POA: Diagnosis not present

## 2021-11-10 DIAGNOSIS — N186 End stage renal disease: Secondary | ICD-10-CM | POA: Diagnosis not present

## 2021-11-10 DIAGNOSIS — F0150 Vascular dementia without behavioral disturbance: Secondary | ICD-10-CM | POA: Diagnosis not present

## 2021-11-10 NOTE — Telephone Encounter (Signed)
Error message

## 2021-11-11 DIAGNOSIS — Z992 Dependence on renal dialysis: Secondary | ICD-10-CM | POA: Diagnosis not present

## 2021-11-11 DIAGNOSIS — N2581 Secondary hyperparathyroidism of renal origin: Secondary | ICD-10-CM | POA: Diagnosis not present

## 2021-11-11 DIAGNOSIS — D631 Anemia in chronic kidney disease: Secondary | ICD-10-CM | POA: Diagnosis not present

## 2021-11-11 DIAGNOSIS — N189 Chronic kidney disease, unspecified: Secondary | ICD-10-CM | POA: Diagnosis not present

## 2021-11-11 DIAGNOSIS — N186 End stage renal disease: Secondary | ICD-10-CM | POA: Diagnosis not present

## 2021-11-13 DIAGNOSIS — N186 End stage renal disease: Secondary | ICD-10-CM | POA: Diagnosis not present

## 2021-11-13 DIAGNOSIS — Z992 Dependence on renal dialysis: Secondary | ICD-10-CM | POA: Diagnosis not present

## 2021-11-13 DIAGNOSIS — N2581 Secondary hyperparathyroidism of renal origin: Secondary | ICD-10-CM | POA: Diagnosis not present

## 2021-11-13 DIAGNOSIS — N189 Chronic kidney disease, unspecified: Secondary | ICD-10-CM | POA: Diagnosis not present

## 2021-11-13 DIAGNOSIS — D631 Anemia in chronic kidney disease: Secondary | ICD-10-CM | POA: Diagnosis not present

## 2021-11-16 ENCOUNTER — Emergency Department (HOSPITAL_COMMUNITY): Payer: Medicare Other

## 2021-11-16 ENCOUNTER — Inpatient Hospital Stay (HOSPITAL_COMMUNITY)
Admission: EM | Admit: 2021-11-16 | Discharge: 2021-11-18 | DRG: 177 | Disposition: A | Discharge status: Home Health | Payer: Medicare Other | Attending: Internal Medicine | Admitting: Internal Medicine

## 2021-11-16 ENCOUNTER — Other Ambulatory Visit: Payer: Self-pay

## 2021-11-16 ENCOUNTER — Encounter (HOSPITAL_COMMUNITY): Payer: Self-pay

## 2021-11-16 DIAGNOSIS — K4090 Unilateral inguinal hernia, without obstruction or gangrene, not specified as recurrent: Secondary | ICD-10-CM | POA: Diagnosis present

## 2021-11-16 DIAGNOSIS — D631 Anemia in chronic kidney disease: Secondary | ICD-10-CM | POA: Diagnosis present

## 2021-11-16 DIAGNOSIS — I5032 Chronic diastolic (congestive) heart failure: Secondary | ICD-10-CM | POA: Diagnosis not present

## 2021-11-16 DIAGNOSIS — Z841 Family history of disorders of kidney and ureter: Secondary | ICD-10-CM

## 2021-11-16 DIAGNOSIS — I272 Pulmonary hypertension, unspecified: Secondary | ICD-10-CM | POA: Diagnosis present

## 2021-11-16 DIAGNOSIS — N390 Urinary tract infection, site not specified: Secondary | ICD-10-CM | POA: Diagnosis present

## 2021-11-16 DIAGNOSIS — I340 Nonrheumatic mitral (valve) insufficiency: Secondary | ICD-10-CM | POA: Diagnosis present

## 2021-11-16 DIAGNOSIS — N3001 Acute cystitis with hematuria: Secondary | ICD-10-CM

## 2021-11-16 DIAGNOSIS — Z992 Dependence on renal dialysis: Secondary | ICD-10-CM | POA: Diagnosis not present

## 2021-11-16 DIAGNOSIS — J449 Chronic obstructive pulmonary disease, unspecified: Secondary | ICD-10-CM | POA: Diagnosis not present

## 2021-11-16 DIAGNOSIS — I4891 Unspecified atrial fibrillation: Secondary | ICD-10-CM | POA: Diagnosis not present

## 2021-11-16 DIAGNOSIS — W19XXXA Unspecified fall, initial encounter: Secondary | ICD-10-CM | POA: Diagnosis not present

## 2021-11-16 DIAGNOSIS — U071 COVID-19: Principal | ICD-10-CM | POA: Diagnosis present

## 2021-11-16 DIAGNOSIS — M898X9 Other specified disorders of bone, unspecified site: Secondary | ICD-10-CM | POA: Diagnosis present

## 2021-11-16 DIAGNOSIS — R531 Weakness: Secondary | ICD-10-CM | POA: Diagnosis not present

## 2021-11-16 DIAGNOSIS — Z79890 Hormone replacement therapy (postmenopausal): Secondary | ICD-10-CM | POA: Diagnosis not present

## 2021-11-16 DIAGNOSIS — I2510 Atherosclerotic heart disease of native coronary artery without angina pectoris: Secondary | ICD-10-CM | POA: Diagnosis present

## 2021-11-16 DIAGNOSIS — F0390 Unspecified dementia without behavioral disturbance: Secondary | ICD-10-CM | POA: Diagnosis present

## 2021-11-16 DIAGNOSIS — R410 Disorientation, unspecified: Secondary | ICD-10-CM | POA: Diagnosis not present

## 2021-11-16 DIAGNOSIS — E039 Hypothyroidism, unspecified: Secondary | ICD-10-CM | POA: Diagnosis present

## 2021-11-16 DIAGNOSIS — E1122 Type 2 diabetes mellitus with diabetic chronic kidney disease: Secondary | ICD-10-CM | POA: Diagnosis not present

## 2021-11-16 DIAGNOSIS — I132 Hypertensive heart and chronic kidney disease with heart failure and with stage 5 chronic kidney disease, or end stage renal disease: Secondary | ICD-10-CM | POA: Diagnosis not present

## 2021-11-16 DIAGNOSIS — E119 Type 2 diabetes mellitus without complications: Secondary | ICD-10-CM

## 2021-11-16 DIAGNOSIS — E1151 Type 2 diabetes mellitus with diabetic peripheral angiopathy without gangrene: Secondary | ICD-10-CM | POA: Diagnosis present

## 2021-11-16 DIAGNOSIS — N186 End stage renal disease: Secondary | ICD-10-CM | POA: Diagnosis present

## 2021-11-16 DIAGNOSIS — G4733 Obstructive sleep apnea (adult) (pediatric): Secondary | ICD-10-CM | POA: Diagnosis not present

## 2021-11-16 DIAGNOSIS — Z79899 Other long term (current) drug therapy: Secondary | ICD-10-CM

## 2021-11-16 DIAGNOSIS — G9341 Metabolic encephalopathy: Secondary | ICD-10-CM | POA: Diagnosis not present

## 2021-11-16 DIAGNOSIS — E785 Hyperlipidemia, unspecified: Secondary | ICD-10-CM | POA: Diagnosis present

## 2021-11-16 DIAGNOSIS — N250 Renal osteodystrophy: Secondary | ICD-10-CM | POA: Diagnosis not present

## 2021-11-16 DIAGNOSIS — I341 Nonrheumatic mitral (valve) prolapse: Secondary | ICD-10-CM | POA: Diagnosis present

## 2021-11-16 DIAGNOSIS — I5030 Unspecified diastolic (congestive) heart failure: Secondary | ICD-10-CM | POA: Diagnosis not present

## 2021-11-16 DIAGNOSIS — I442 Atrioventricular block, complete: Secondary | ICD-10-CM | POA: Diagnosis present

## 2021-11-16 DIAGNOSIS — Z950 Presence of cardiac pacemaker: Secondary | ICD-10-CM | POA: Diagnosis not present

## 2021-11-16 DIAGNOSIS — Z8249 Family history of ischemic heart disease and other diseases of the circulatory system: Secondary | ICD-10-CM

## 2021-11-16 DIAGNOSIS — G9340 Encephalopathy, unspecified: Secondary | ICD-10-CM | POA: Diagnosis present

## 2021-11-16 DIAGNOSIS — Z7982 Long term (current) use of aspirin: Secondary | ICD-10-CM | POA: Diagnosis not present

## 2021-11-16 DIAGNOSIS — I120 Hypertensive chronic kidney disease with stage 5 chronic kidney disease or end stage renal disease: Secondary | ICD-10-CM | POA: Diagnosis not present

## 2021-11-16 DIAGNOSIS — Z043 Encounter for examination and observation following other accident: Secondary | ICD-10-CM | POA: Diagnosis not present

## 2021-11-16 DIAGNOSIS — Z87891 Personal history of nicotine dependence: Secondary | ICD-10-CM

## 2021-11-16 DIAGNOSIS — Z91041 Radiographic dye allergy status: Secondary | ICD-10-CM

## 2021-11-16 DIAGNOSIS — R4182 Altered mental status, unspecified: Secondary | ICD-10-CM | POA: Diagnosis not present

## 2021-11-16 DIAGNOSIS — M47814 Spondylosis without myelopathy or radiculopathy, thoracic region: Secondary | ICD-10-CM | POA: Diagnosis not present

## 2021-11-16 DIAGNOSIS — Z888 Allergy status to other drugs, medicaments and biological substances status: Secondary | ICD-10-CM

## 2021-11-16 LAB — RESP PANEL BY RT-PCR (FLU A&B, COVID) ARPGX2
Influenza A by PCR: NEGATIVE
Influenza B by PCR: NEGATIVE
SARS Coronavirus 2 by RT PCR: POSITIVE — AB

## 2021-11-16 LAB — COMPREHENSIVE METABOLIC PANEL
ALT: 15 U/L (ref 0–44)
AST: 21 U/L (ref 15–41)
Albumin: 3.4 g/dL — ABNORMAL LOW (ref 3.5–5.0)
Alkaline Phosphatase: 63 U/L (ref 38–126)
Anion gap: 13 (ref 5–15)
BUN: 76 mg/dL — ABNORMAL HIGH (ref 8–23)
CO2: 30 mmol/L (ref 22–32)
Calcium: 10 mg/dL (ref 8.9–10.3)
Chloride: 97 mmol/L — ABNORMAL LOW (ref 98–111)
Creatinine, Ser: 8.25 mg/dL — ABNORMAL HIGH (ref 0.61–1.24)
GFR, Estimated: 6 mL/min — ABNORMAL LOW (ref >60)
Glucose, Bld: 150 mg/dL — ABNORMAL HIGH (ref 70–99)
Potassium: 3.8 mmol/L (ref 3.5–5.1)
Sodium: 140 mmol/L (ref 135–145)
Total Bilirubin: 1.3 mg/dL — ABNORMAL HIGH (ref 0.3–1.2)
Total Protein: 6.8 g/dL (ref 6.5–8.1)

## 2021-11-16 LAB — URINALYSIS, ROUTINE W REFLEX MICROSCOPIC
Bilirubin Urine: NEGATIVE
Glucose, UA: NEGATIVE mg/dL
Ketones, ur: NEGATIVE mg/dL
Leukocytes,Ua: NEGATIVE
Nitrite: NEGATIVE
Protein, ur: 100 mg/dL — AB
RBC / HPF: >50 RBC/hpf — ABNORMAL HIGH (ref 0–5)
Specific Gravity, Urine: 1.017 (ref 1.005–1.030)
pH: 5 (ref 5.0–8.0)

## 2021-11-16 LAB — CBC WITH DIFFERENTIAL/PLATELET
Abs Immature Granulocytes: 0 10*3/uL (ref 0.00–0.07)
Basophils Absolute: 0 10*3/uL (ref 0.0–0.1)
Basophils Relative: 0 %
Eosinophils Absolute: 0 10*3/uL (ref 0.0–0.5)
Eosinophils Relative: 0 %
HCT: 34.5 % — ABNORMAL LOW (ref 39.0–52.0)
Hemoglobin: 11.1 g/dL — ABNORMAL LOW (ref 13.0–17.0)
Lymphocytes Relative: 7 %
Lymphs Abs: 1 10*3/uL (ref 0.7–4.0)
MCH: 32.3 pg (ref 26.0–34.0)
MCHC: 32.2 g/dL (ref 30.0–36.0)
MCV: 100.3 fL — ABNORMAL HIGH (ref 80.0–100.0)
Monocytes Absolute: 0 10*3/uL — ABNORMAL LOW (ref 0.1–1.0)
Monocytes Relative: 0 %
Neutro Abs: 13.2 10*3/uL — ABNORMAL HIGH (ref 1.7–7.7)
Neutrophils Relative %: 93 %
Platelets: 120 10*3/uL — ABNORMAL LOW (ref 150–400)
RBC: 3.44 MIL/uL — ABNORMAL LOW (ref 4.22–5.81)
RDW: 15.2 % (ref 11.5–15.5)
WBC: 14.2 10*3/uL — ABNORMAL HIGH (ref 4.0–10.5)
nRBC: 0 % (ref 0.0–0.2)

## 2021-11-16 LAB — RENAL FUNCTION PANEL
Albumin: 3.2 g/dL — ABNORMAL LOW (ref 3.5–5.0)
Anion gap: 13 (ref 5–15)
BUN: 81 mg/dL — ABNORMAL HIGH (ref 8–23)
CO2: 28 mmol/L (ref 22–32)
Calcium: 9.8 mg/dL (ref 8.9–10.3)
Chloride: 99 mmol/L (ref 98–111)
Creatinine, Ser: 8.37 mg/dL — ABNORMAL HIGH (ref 0.61–1.24)
GFR, Estimated: 6 mL/min — ABNORMAL LOW (ref >60)
Glucose, Bld: 142 mg/dL — ABNORMAL HIGH (ref 70–99)
Phosphorus: 3.6 mg/dL (ref 2.5–4.6)
Potassium: 3.9 mmol/L (ref 3.5–5.1)
Sodium: 140 mmol/L (ref 135–145)

## 2021-11-16 LAB — CBG MONITORING, ED: Glucose-Capillary: 135 mg/dL — ABNORMAL HIGH (ref 70–99)

## 2021-11-16 LAB — TSH: TSH: 0.691 u[IU]/mL (ref 0.350–4.500)

## 2021-11-16 LAB — AMMONIA: Ammonia: 26 umol/L (ref 9–35)

## 2021-11-16 MED ORDER — SODIUM CHLORIDE 0.9 % IV SOLN
1.0000 g | Freq: Once | INTRAVENOUS | Status: AC
  Administered 2021-11-16: 1 g via INTRAVENOUS
  Filled 2021-11-16: qty 10

## 2021-11-16 MED ORDER — SODIUM CHLORIDE 0.9 % IV SOLN
1.0000 g | INTRAVENOUS | Status: DC
  Administered 2021-11-17: 1 g via INTRAVENOUS
  Filled 2021-11-16: qty 10

## 2021-11-16 MED ORDER — ATORVASTATIN CALCIUM 10 MG PO TABS
20.0000 mg | ORAL_TABLET | Freq: Every day | ORAL | Status: DC
  Administered 2021-11-17 – 2021-11-18 (×2): 20 mg via ORAL
  Filled 2021-11-16 (×2): qty 2

## 2021-11-16 MED ORDER — ACETAMINOPHEN 325 MG PO TABS
650.0000 mg | ORAL_TABLET | Freq: Four times a day (QID) | ORAL | Status: DC | PRN
  Administered 2021-11-18: 650 mg via ORAL
  Filled 2021-11-16: qty 2

## 2021-11-16 MED ORDER — MEMANTINE HCL 10 MG PO TABS
10.0000 mg | ORAL_TABLET | Freq: Two times a day (BID) | ORAL | Status: DC
  Administered 2021-11-17 – 2021-11-18 (×3): 10 mg via ORAL
  Filled 2021-11-16 (×3): qty 1

## 2021-11-16 MED ORDER — LACTATED RINGERS IV BOLUS
250.0000 mL | Freq: Once | INTRAVENOUS | Status: AC
  Administered 2021-11-16: 250 mL via INTRAVENOUS

## 2021-11-16 MED ORDER — DOXAZOSIN MESYLATE 4 MG PO TABS
4.0000 mg | ORAL_TABLET | Freq: Every day | ORAL | Status: DC
  Administered 2021-11-17 – 2021-11-18 (×2): 4 mg via ORAL
  Filled 2021-11-16 (×2): qty 1

## 2021-11-16 MED ORDER — ACETAMINOPHEN 650 MG RE SUPP: 650.0000 mg | Freq: Four times a day (QID) | RECTAL | Status: DC | PRN

## 2021-11-16 MED ORDER — HEPARIN SODIUM (PORCINE) 5000 UNIT/ML IJ SOLN
5000.0000 [IU] | Freq: Three times a day (TID) | INTRAMUSCULAR | Status: DC
  Administered 2021-11-16 – 2021-11-18 (×6): 5000 [IU] via SUBCUTANEOUS
  Filled 2021-11-16 (×6): qty 1

## 2021-11-16 MED ORDER — LEVOTHYROXINE SODIUM 75 MCG PO TABS
150.0000 ug | ORAL_TABLET | Freq: Every day | ORAL | Status: DC
  Administered 2021-11-17 – 2021-11-18 (×2): 150 ug via ORAL
  Filled 2021-11-16 (×2): qty 2

## 2021-11-16 MED ORDER — DONEPEZIL HCL 10 MG PO TABS
10.0000 mg | ORAL_TABLET | Freq: Every day | ORAL | Status: DC
  Administered 2021-11-16 – 2021-11-17 (×2): 10 mg via ORAL
  Filled 2021-11-16 (×2): qty 1

## 2021-11-16 NOTE — ED Provider Notes (Signed)
  Physical Exam  BP (!) 154/66   Pulse 70   Temp 98 F (36.7 C) (Rectal)   Resp (!) 21   SpO2 95%     Procedures  Procedures  ED Course / MDM   Clinical Course as of 11/16/21 1957  Mon Nov 16, 2021  1819 SARS Coronavirus 2 by RT PCR(!): POSITIVE [JL]  1935 WBC, UA: 11-20 [JL]  1935 Bacteria, UA(!): RARE [JL]  1935 RBC / HPF(!): >50 [JL]    Clinical Course User Index [JL] Regan Lemming, MD   Medical Decision Making Amount and/or Complexity of Data Reviewed Labs: ordered. Decision-making details documented in ED Course. Radiology: ordered.  Risk Decision regarding hospitalization.   78M, hx of dementia, presenting with some generalized weakness and mild increased confusion compared to baseline. Fall today, imaging reassuring. Needs urinalysis and COVID and ambulatory trial before DC.   Patient tested positive for COVID.  He is not hypoxic and was ambulatory at his baseline with assistance ambulatory with a walker.  Family members not bedside for an update.  The patient was examined bedside.  He was found to have a right-sided inguinal hernia.  This could explain his leukocytosis although urinary tract infection and developing pyelonephritis could also explain this.  His inguinal hernia was reduced bedside with gentle pressure.  His abdomen is soft, nondistended, nontender on exam.  I did update to the patient's daughter regarding the plan of care.  The patient's urinalysis did result with greater than 50 RBCs, rare bacteria, 11-20 WBCs.  Will treat for developing UTI given the patient's worsening confusion.  He had some mild suprapubic tenderness on exam but also had recently had a reduced right-sided inguinal hernia.  IV Rocephin ordered.  The internal medicine teaching service was consulted for admission and subsequently excepted the patient in admission.  The patient is mildly uremic.  His chest x-ray did show possible early pulmonary edema.  He is not tachypneic and  saturating well on room air.  Nephrology consulted to arrange for inpatient dialysis non-emergently.  Subsequently admitted in stable condition.     Regan Lemming, MD 11/16/21 [phone]

## 2021-11-16 NOTE — ED Provider Notes (Signed)
[facility] EMERGENCY DEPARTMENT Provider Note   CSN: [id number] Arrival date & time: 11/16/21  1119  LEVEL 5 CAVEAT - DEMENTIA   History  Chief Complaint  Patient presents with   Unknown Fall   Weakness    Jeffrey Campbell is a 88 y.o. male.  HPI 88 year old male presents with generalized weakness.  History is initially from EMS and primarily from the daughter.  Patient has dementia and is on dialysis.  He has help from family and caregiver.  Last went to dialysis on Friday (today is Monday).  Seemed weaker than normal after dialysis.  Did okay the last couple days.  However today he seemed weaker than normal and a little more confused than normal.  Patient had a hard time getting out of the bathtub and so his daughter had to call 911 for help and assessment.  At some point he must have fallen according to daughter as he has an abrasion to his forehead and seems to have a bruise to his mid thoracic back.  Was unable to get to dialysis today due to this weakness.  He was able to eventually walk with a walker though required assistance getting to it.  When he was given food this morning he was very slow to go eat it, which is atypical for him.  No known fevers.  He has a chronic cough.  Has a history of dementia but seems a little more confused today.  Home Medications Prior to Admission medications   Medication Sig Start Date End Date Taking? Authorizing Provider  amLODipine (NORVASC) 10 MG tablet TAKE 1 TABLET BY MOUTH EVERY DAY 08/08/19   Lyda Jester M, PA-C  amLODipine (NORVASC) 10 MG tablet 1 tablet    [provider]  apixaban (ELIQUIS) 2.5 MG TABS tablet Take 1 tablet (2.5 mg total) by mouth 2 (two) times daily. 06/19/19   Larey Dresser, MD  aspirin EC 81 MG tablet 1 tablet    [provider]  atorvastatin (LIPITOR) 20 MG tablet 1 tablet    [provider]  atorvastatin (LIPITOR) 20 MG tablet TAKE 1 BY MOUTH DAILY ABSOLUTE LAST  REFILL WITHOUT OFFICE VISIT PLEASE CALL [phone] TO SCHEDULE 11/09/21   Larey Dresser, MD  Brinzolamide-Brimonidine [facility]) 1-0.2 % SUSP Place 1 drop into both eyes 2 (two) times daily.     [provider]  calcitRIOL (ROCALTROL) 0.25 MCG capsule Take 0.25 mcg by mouth daily. 09/23/19   [provider]  calcium acetate (PHOSLO) 667 MG capsule Take 667 mg by mouth 3 (three) times daily. 11/30/19   [provider]  cinacalcet (SENSIPAR) 30 MG tablet Take by mouth. 10/12/19   [provider]  cloNIDine (CATAPRES) 0.2 MG tablet 1 tablet 02/12/19   [provider]  donepezil (ARICEPT) 10 MG tablet Take 1 tablet (10 mg total) by mouth at bedtime. 08/17/21   Suzzanne Cloud, NP  doxazosin (CARDURA) 4 MG tablet Take 1 tablet (4 mg total) by mouth daily. 02/12/19   Lyda Jester M, PA-C  doxazosin (CARDURA) 4 MG tablet 1 tablet 12/11/12   [provider]  doxycycline (VIBRAMYCIN) 100 MG capsule 1 capsule 12/25/20   [provider]  Epoetin Alfa (PROCRIT IJ) Inject 5,000 Units as directed every 14 (fourteen) days.     [provider]  folic acid-vitamin b complex-vitamin c-selenium-zinc (DIALYVITE) 3 MG TABS tablet Take 1 tablet by mouth daily.    [provider]  furosemide (LASIX)  40 MG tablet 1 tablet 09/03/18   [provider]  hydrALAZINE (APRESOLINE) 100 MG tablet TAKE 1 TABLET BY MOUTH 3 TIMES DAILY. PLEASE MAKE YEARLY APPT WITH DR.TURNER FOR OCTOBER 1ST ATTEMPT 01/19/21   Sueanne Margarita, MD  hydrALAZINE (APRESOLINE) 100 MG tablet 1 tablet with food    [provider]  levothyroxine (SYNTHROID) 175 MCG tablet Take 175 mcg by mouth every morning. 09/27/19   [provider]  levothyroxine (SYNTHROID, LEVOTHROID) 150 MCG tablet Take 150 mcg by mouth daily before breakfast.    [provider]  Levothyroxine Sodium 150 MCG CAPS 1 tablet in the morning on an empty stomach    [provider]  lidocaine-prilocaine (EMLA) cream  08/10/19   [provider]  losartan (COZAAR) 50 MG tablet 1 tablet    [provider]  memantine (NAMENDA) 10 MG tablet TAKE 1 TABLET BY MOUTH TWICE A DAY 01/19/21   Suzzanne Cloud, NP  memantine (NAMENDA) 10 MG tablet 1 tablet    [provider]  Methoxy PEG-Epoetin Beta (MIRCERA IJ) Mircera 05/13/21 05/12/22  [provider]  multivitamin (RENA-VIT) TABS tablet Take 1 tablet by mouth daily. 11/01/19   [provider]  QUEtiapine (SEROQUEL) 25 MG tablet 0.5 tablet at bedtime 09/03/18   [provider]  sevelamer carbonate (RENVELA) 800 MG tablet Take by mouth. 03/05/20   [provider]  Tuberculin PPD (TUBERSOL ID) Inject into the skin. 07/20/19   [provider]      Allergies    Iodinated contrast media, Zocor [simvastatin], Calcitriol, Budesonide-formoterol fumarate, Minoxidil, Tiotropium bromide monohydrate, and Tricor [fenofibrate]    Review of Systems   Review of Systems  Constitutional:  Negative for fever.  Neurological:  Positive for weakness.    Physical Exam Updated Vital Signs BP (!) 154/66   Pulse 70   Temp 98 F (36.7 C) (Rectal)   Resp (!) 21   SpO2 95%  Physical Exam Vitals and nursing note reviewed.  Constitutional:      Appearance: He is well-developed.     Interventions: Cervical collar in place.  HENT:     Head: Normocephalic. Abrasion present.   Eyes:     Extraocular Movements: Extraocular movements intact.     Pupils: Pupils are equal, round, and reactive to light.  Neck:   Cardiovascular:     Rate and Rhythm: Normal rate and regular rhythm.     Heart sounds: Murmur heard.  Pulmonary:     Effort: Pulmonary effort is normal.     Breath sounds: Normal breath sounds. No wheezing.  Abdominal:     General: There is no distension.     Palpations: Abdomen is soft.     Tenderness: There is no abdominal tenderness.  Musculoskeletal:      Right shoulder: No tenderness.     Right upper arm: No tenderness.     Right elbow: Normal range of motion. No tenderness.     Right forearm: Tenderness present.     Thoracic back: No tenderness.     Lumbar back: No tenderness.       Back:     Right hip: Normal range of motion.     Left hip: Normal range of motion.  Skin:    General: Skin is warm and dry.  Neurological:     Mental Status: He is alert. He is disoriented.     Comments: 5/5 strength in all 4 extremities.  No facial droop or slurred  speech.  Speaks slowly.  Disoriented.     ED Results / Procedures / Treatments   Labs (all labs ordered are listed, but only abnormal results are displayed) Labs Reviewed  COMPREHENSIVE METABOLIC PANEL - Abnormal; Notable for the following components:      Result Value   Chloride 97 (*)    Glucose, Bld 150 (*)    BUN 76 (*)    Creatinine, Ser 8.25 (*)    Albumin 3.4 (*)    Total Bilirubin 1.3 (*)    GFR, Estimated 6 (*)    All other components within normal limits  CBC WITH DIFFERENTIAL/PLATELET - Abnormal; Notable for the following components:   WBC 14.2 (*)    RBC 3.44 (*)    Hemoglobin 11.1 (*)    HCT 34.5 (*)    MCV 100.3 (*)    Platelets 120 (*)    Neutro Abs 13.2 (*)    Monocytes Absolute 0.0 (*)    All other components within normal limits  CBG MONITORING, ED - Abnormal; Notable for the following components:   Glucose-Capillary 135 (*)    All other components within normal limits  RESP PANEL BY RT-PCR (FLU A&B, COVID) ARPGX2  AMMONIA  URINALYSIS, ROUTINE W REFLEX MICROSCOPIC    EKG EKG Interpretation  Date/Time:  Monday November 16 2021 14:03:32 EDT Ventricular Rate:  70 PR Interval:    QRS Duration: 174 QT Interval:  468 QTC Calculation: 505 R Axis:   268 Text Interpretation: Atrial fibrillation Nonspecific IVCD with LAD Consider left ventricular hypertrophy Confirmed by Sherwood Gambler [phone]) on 11/16/2021 3:13:28 PM  Radiology CT HEAD WO  CONTRAST  Result Date: 11/16/2021 CLINICAL DATA:  Fall EXAM: CT HEAD WITHOUT CONTRAST CT CERVICAL SPINE WITHOUT CONTRAST TECHNIQUE: Multidetector CT imaging of the head and cervical spine was performed following the standard protocol without intravenous contrast. Multiplanar CT image reconstructions of the cervical spine were also generated. RADIATION DOSE REDUCTION: This exam was performed according to the departmental dose-optimization program which includes automated exposure control, adjustment of the mA and/or kV according to patient size and/or use of iterative reconstruction technique. COMPARISON:  None Available. FINDINGS: CT HEAD FINDINGS Brain: Chronic white matter ischemic change. No evidence of acute infarction, hemorrhage, hydrocephalus, extra-axial collection or mass lesion/mass effect. Vascular: No hyperdense vessel or unexpected calcification. Skull: Normal. Negative for fracture or focal lesion. Sinuses/Orbits: No acute finding. Other: None. CT CERVICAL SPINE FINDINGS Alignment: Normal. Skull base and vertebrae: No acute fracture. No primary bone lesion or focal pathologic process. Soft tissues and spinal canal: No prevertebral fluid or swelling. No visible canal hematoma. Disc levels:  Moderate multilevel degenerative disc disease. Upper chest: Negative. Other: None. IMPRESSION: 1. No acute intracranial abnormality. 2. No evidence of acute cervical spine fracture or traumatic malalignment. Electronically Signed   By: Yetta Glassman M.D.   On: 11/16/2021 15:08   CT CERVICAL SPINE WO CONTRAST  Result Date: 11/16/2021 CLINICAL DATA:  Fall EXAM: CT HEAD WITHOUT CONTRAST CT CERVICAL SPINE WITHOUT CONTRAST TECHNIQUE: Multidetector CT imaging of the head and cervical spine was performed following the standard protocol without intravenous contrast. Multiplanar CT image reconstructions of the cervical spine were also generated. RADIATION DOSE REDUCTION: This exam was performed according to the  departmental dose-optimization program which includes automated exposure control, adjustment of the mA and/or kV according to patient size and/or use of iterative reconstruction technique. COMPARISON:  None Available. FINDINGS: CT HEAD FINDINGS Brain: Chronic white matter ischemic change. No evidence of acute infarction,  hemorrhage, hydrocephalus, extra-axial collection or mass lesion/mass effect. Vascular: No hyperdense vessel or unexpected calcification. Skull: Normal. Negative for fracture or focal lesion. Sinuses/Orbits: No acute finding. Other: None. CT CERVICAL SPINE FINDINGS Alignment: Normal. Skull base and vertebrae: No acute fracture. No primary bone lesion or focal pathologic process. Soft tissues and spinal canal: No prevertebral fluid or swelling. No visible canal hematoma. Disc levels:  Moderate multilevel degenerative disc disease. Upper chest: Negative. Other: None. IMPRESSION: 1. No acute intracranial abnormality. 2. No evidence of acute cervical spine fracture or traumatic malalignment. Electronically Signed   By: Yetta Glassman M.D.   On: 11/16/2021 15:08   DG Thoracic Spine W/Swimmers  Result Date: 11/16/2021 CLINICAL DATA:  Status post fall. EXAM: THORACIC SPINE - 3 VIEWS COMPARISON:  Chest radiographs and CT cervical spine 08/13/2019. FINDINGS: There are 12 rib-bearing thoracic type vertebral bodies. The alignment is normal. No evidence of acute fracture, paraspinal hematoma or widening of the interpedicular distance. There are mild degenerative changes within the visualized cervicothoracic spine. Patient has a pacemaker. IMPRESSION: No evidence of acute thoracic spine injury.  Mild spondylosis. Electronically Signed   By: Richardean Sale M.D.   On: 11/16/2021 13:26   DG Chest 2 View  Result Date: 11/16/2021 CLINICAL DATA:  Altered mental status.  Status post fall. EXAM: CHEST - 2 VIEW COMPARISON:  Radiographs 08/13/2019.  CT 05/12/2016. FINDINGS: Right subclavian pacemaker leads  extend into the right atrium and right ventricle. The heart is mildly enlarged. There is aortic atherosclerosis. There is increased vascular congestion and probable mild pulmonary edema. There may be small bilateral pleural effusions. No confluent airspace opacity or pneumothorax. No acute osseous findings are evident. IMPRESSION: Cardiomegaly with increased vascular congestion and possible pulmonary edema. Electronically Signed   By: Richardean Sale M.D.   On: 11/16/2021 13:24   DG Forearm Right  Result Date: 11/16/2021 CLINICAL DATA:  Status post fall. EXAM: RIGHT FOREARM - 2 VIEW COMPARISON:  None Available. FINDINGS: The bones appear adequately mineralized. No evidence of acute fracture, dislocation or significant elbow joint effusion. Mild radiocarpal degenerative changes and olecranon spurring. Scattered vascular calcifications are noted. No evidence of foreign body or soft tissue emphysema. IMPRESSION: No evidence of acute fracture or dislocation. Electronically Signed   By: Richardean Sale M.D.   On: 11/16/2021 13:22    Procedures Procedures    Medications Ordered in ED Medications - No data to display  ED Course/ Medical Decision Making/ A&P                           Medical Decision Making Amount and/or Complexity of Data Reviewed Labs: ordered. Radiology: ordered.   Patient's vital signs are okay.  He is pleasantly confused but not ill-appearing.  On reevaluation his daughter states that he seems to be at his mental baseline but she was concerned about how weak he was this morning.  We will get him up to ambulate to see how he does.  He also still makes urine so urinalysis was is currently pending and I have asked the nursing staff to and and and out catheterize.  Head CT is negative from an acute bleeding standpoint.  Questionable trauma but his CT of his C-spine is also negative.  C-collar removed.  X-rays viewed by myself as well and it seems like he has perhaps some mild edema  which would probably be consistent with missing dialysis today.  However he does not otherwise appear to  need to have emergent dialysis currently.  At this point, he has a nonspecific leukocytosis of 14 but no other signs of an infection.  Have pretty low suspicion for serious bacterial illness such as bacteremia or meningitis.  No evidence of pneumonia.  COVID test, urine, and ambulation are all pending. Care transferred to Dr. Armandina Gemma.        Final Clinical Impression(s) / ED Diagnoses Final diagnoses:  None    Rx / DC Orders ED Discharge Orders     None         Sherwood Gambler, MD 11/16/21 1622

## 2021-11-16 NOTE — ED Notes (Signed)
Ambulated pt w/ walker. Pt initially seemed unsteady on feet, but upon returning to room, pt had a very steady gait w/ the walker.

## 2021-11-16 NOTE — ED Notes (Signed)
Patient currently at CT

## 2021-11-16 NOTE — H&P (Signed)
Date: 11/16/2021               Patient Name:  Jeffrey Campbell MRN: [id number]  DOB: 03-15-1933 Age / Sex: 88 y.o., male   PCP: [name], MD (Inactive)         Medical Service: Internal Medicine Teaching Service         Attending Physician: Dr. Regan Lemming, MD    First Contact: Johny Blamer, DO      Pager: Lavone Nian [phone]      Second Contact: Sanjuana Letters, DO      Pager: Maryjean Morn [phone]           After Hours (After 5p/  First Contact Pager: [phone]  weekends / holidays): Second Contact Pager: [phone]   SUBJECTIVE   Chief Complaint: generalized weakness  History of Present Illness:  Jeffrey Campbell is an 88 year old male with past medical history of HTN, HLD, T2DM, hypothyroidism, COPD, OSA, HFpEF, PAD, CAD, complete heart block s/p pacemaker, mitral valve prolapse with mitral regurgitation, ESRD on MWF HD, and dementia who was brought to the ED for acute onset generalized weakness and confusion.  Attempted to obtain history from patient with limited success and the majority of his information was obtained from his daughter Genevieve Norlander over the phone.  His daughter lives with him between Friday night/Saturday morning and stays until Tuesdsay night and then [place] work between Tuesday-Friday and a caregiver/aide watches over him when his daughter is not at home. His daughter was first informed of possible symptoms on Friday evening when Jeffrey Campbell aide informed her that he was having some weakness following dialysis session and that he might have had an unwitnessed fall during the day as well.  His daughter notes that he typically is weaker after dialysis sessions and needs to use a wheelchair.  She was able to monitor him over camera on Friday night and noted that he did seem a little bit weaker and slightly confused but overall okay.  She is then returned home late Friday night and noted that he was okay on Saturday and Sunday. This morning (Monday)  she noted that he was weak and couldn't get out of bed and didn't eat, both of which are atypical for him as he is usually ambulatory without DME on nondialysis days.  Additionally she notes that he had a runny nose today, seemed more confused and did not have much of an appetite, and possibly had an episode of diarrhea this morning.  He does have a chronic nonproductive cough which has not seem to have worsened.  She does not think he has had any chest pain, dyspnea, nausea, vomiting, changes in urination (he does produce some urine).  She does note that he has received all his vaccinations and boosters for COVID.  His last dialysis session was Friday and he did not go today because he was in the ED from this morning.  ED Course: Diagnostic work-up as below.  See ED provider note for full assessment and plan.  He was noted to be COVID-positive with possible concern for UTI as well.  We did provider also noted an inguinal hernia which was reduced at bedside with benign abdominal exam.  He was started on ceftriaxone and received a small bolus of 250 cc of LR.  MTS was consulted for admission and nephrology was consulted to arrange nonemergent dialysis.  Meds: Taking: Memantine '10mg'$  BID Lipitor '20mg'$  daily Doxazosin '4mg'$  BID Synthroid 150 mcg daily Donepezil  QHS Calcium acetate 667 TID Rena-vit daily  Not taking:  (daughter read out list of meds that he has at home.  She noted that some of the meds below were stopped.  She is not aware of everything he gets at dialysis)  Eliquis 2.'5mg'$  BID (previously administered for history of a flutter, possibly stopped in 2021 after falls and SDH) Amlodipine 10 daily Aspirin 81 daily Simbrinza eyedrops Clonidine 0.2 mg daily Lasix 40 mg daily Hydralazine 100 mg 3 times daily Losartan 50 mg daily Seroquel 12.5 mg nightly Renvela 800 mg Sensipar 30 mg  Past Medical History  Past Surgical History:  Procedure Laterality Date   APPENDECTOMY     AV  FISTULA PLACEMENT Left 10/18/2017   Procedure: ARTERIOVENOUS (AV) FISTULA CREATION ARM;  Surgeon: Waynetta Sandy, MD;  Location: [place];  Service: Vascular;  Laterality: Left;   CARDIAC CATHETERIZATION     CARDIAC CATHETERIZATION N/A 02/17/2015   Procedure: Right Heart Cath;  Surgeon: Larey Dresser, MD;  Location: [place] [facility];  Service: Cardiovascular;  Laterality: N/A;   CARDIOVERSION N/A 02/08/2019   Procedure: CARDIOVERSION;  Surgeon: Donato Heinz, MD;  Location: [place];  Service: Endoscopy;  Laterality: N/A;   ESOPHAGOGASTRODUODENOSCOPY (EGD) WITH PROPOFOL Left 10/16/2016   Procedure: ESOPHAGOGASTRODUODENOSCOPY (EGD) WITH PROPOFOL;  Surgeon: Ronnette Juniper, MD;  Location: [place];  Service: Gastroenterology;  Laterality: Left;   heart catherization     HERNIA REPAIR  10/2009   IR RADIOLOGIST EVAL & MGMT  09/28/2016   IR RADIOLOGIST EVAL & MGMT  10/19/2016   IR RADIOLOGIST EVAL & MGMT  01/18/2017   PACEMAKER IMPLANT N/A 02/09/2019   Procedure: PACEMAKER IMPLANT;  Surgeon: Constance Haw, MD;  Location: [place] [facility];  Service: Cardiovascular;  Laterality: N/A;   RIGHT HEART CATH N/A 07/28/2016   Procedure: Right Heart Cath;  Surgeon: Larey Dresser, MD;  Location: [place] [facility];  Service: Cardiovascular;  Laterality: N/A;  ESRD last HD Friday on [address] HD on [address] Previously seeing Dr. Aundra Dubin, but has not gone in a while Follows with palliative in the outpatient setting.   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 Can't manage IADLs. Can't cook but can feed himself. Occasionally walks PCP: Dr. Jonna Coup [facility] [employer] Substances: Remote smoking, alcohol history 40+ years ago  Daughter is POA. Full Code.  Family History:  Family History  Problem Relation Age of  Onset   Kidney failure Father    Hypertension Father    Colon cancer Mother    Colon cancer Brother    Lung disease Brother    Colon cancer Maternal Uncle      Allergies: Allergies as of 11/16/2021 - Review Complete 11/16/2021  Allergen Reaction Noted   Iodinated contrast media Other (See Comments) 09/28/2016   Zocor [simvastatin] Other (See Comments) 09/21/2010   Calcitriol Other (See Comments) 08/07/2019   Budesonide-formoterol fumarate Other (See Comments)    Minoxidil Other (See Comments) 03/16/2017   Tiotropium bromide monohydrate Other (See Comments) 01/26/2011   Tricor [fenofibrate]  05/29/2013    Review of Systems: A complete ROS was negative except as per HPI.   OBJECTIVE:  Physical Exam: Blood pressure (!) 169/65, pulse 70, temperature 98.2 F (36.8 C), temperature source Oral, resp. rate 13, SpO2 99 %.  Constitutional: Elderly appearing male in no acute distress.  Pleasant but disoriented. Cardiovascular: RRR, 3/6 systolic murmur best heard at apex, 2/6 systolic murmur heard at upper sternal border, increased skin turgor. Pulmonary/Chest: normal work of breathing on room air, mild diffuse wheezing.  Breath sounds diminished at lower lung bases. Abdominal: soft, non-tender, non-distended Extremities: warm, well perfused, extremity pulses 2+, no BLE pitting edema.  Left upper extremity fistula clean, nontender nonerythematous with palpable thrill. Neuro: Alert and oriented x1.  CNs and extremity strength grossly intact.  Labs:    Latest Ref Rng & Units 11/16/2021    1:33 PM 08/13/2019    6:10 PM 08/01/2019    5:10 AM 07/31/2019    3:55 AM 07/30/2019    3:17 AM 07/29/2019    7:43 AM 07/28/2019    6:23 AM  CBC EXTENDED  WBC 4.0 - 10.5 K/uL 14.2  8.0  8.7  10.6  11.3  13.4  12.9   RBC 4.22 - 5.81 MIL/uL 3.44  2.31  2.83  3.13  3.40  3.74  3.12   Hemoglobin 13.0 - 17.0 g/dL 11.1  7.0  8.8  9.7  10.4  11.2  9.5   HCT 39.0 - 52.0 % 34.5  23.3  27.3  30.9  32.9  35.7  30.3    Platelets 150 - 400 K/uL 120  193  194  190  197  204  158   NEUT# 1.7 - 7.7 K/uL 13.2  6.6      11.5   Lymph# 0.7 - 4.0 K/uL 1.0  0.7      0.5       Latest Ref Rng & Units 11/16/2021    1:33 PM 08/13/2019    6:10 PM 08/01/2019    5:10 AM 07/31/2019    3:55 AM 07/30/2019    3:17 AM 07/29/2019    7:43 AM 07/28/2019    6:23 AM  CMP  Glucose 70 - 99 mg/dL 150  90  131  154  148  143  116   BUN 8 - 23 mg/dL 76  54  38  94  68  40  50   Creatinine 0.61 - 1.24 mg/dL 8.25  6.95  4.60  7.22  5.60  4.07  4.65   Sodium 135 - 145 mmol/L 140  140  135  139  143  142  140   Potassium 3.5 - 5.1 mmol/L 3.8  3.9  3.3  4.1  4.3  4.2  3.7   Chloride 98 - 111 mmol/L 97  101  93  100  103  100  103   CO2 22 - 32 mmol/L '30  27  28  18  24  26  26   '$ Calcium 8.9 - 10.3 mg/dL 10.0  9.1  9.3  10.1  10.6  10.8  10.6   Total Protein 6.5 - 8.1 g/dL 6.8       7.2   Total Bilirubin 0.3 - 1.2 mg/dL 1.3       1.4   Alkaline Phos 38 - 126 U/L 63       70   AST 15 - 41 U/L 21       31   ALT 0 - 44 U/L 15       13   TSH 1.5  Imaging: CXR: Marginally increased cardiomegaly and pulmonary edema with possible bilateral minimal pleural effusions compared to prior chest x-ray 08/13/2019.  Cardiac pacemaker in place with leads projecting over the right atrium and right ventricle no  focal opacities or MSK trauma.  Right forearm x-ray: No fracture or dislocation  T-spine x-ray with swimmer's view: No acute fracture, dislocation, paraspinal pathology.  CT head: No bleeds, acute fractures, signs of infarct.  Chronic white matter changes with age-appropriate cortical atrophy and ventriculomegaly.  CT C-spine: No acute fracture or dislocation, no paraspinal pathology, moderate degenerative disc disease.  EKG: Patient is on continuous telemetry with ventricular pacing EKG appears similar to prior 1 from 08/15/2019.  ASSESSMENT & PLAN:   Jeffrey Campbell is a 88 y.o. male with PMH HTN, HLD, T2DM, hypothyroidism, COPD, OSA, HFpEF, PAD,  CAD, complete heart block s/p pacemaker, mitral valve prolapse with mitral regurgitation, ESRD on MWF HD, and dementia who presented with generalized weakness and confusion and admitted for acute infectious encephalopathy secondary to COVID infection plus possible UTI on hospital day 0  #Acute infectious encephalopathy #Generalized weakness #COVID #UTI #Dementia Patient with acute onset generalized weakness and confusion with loss of appetite found to be COVID-positive with concern for UTI on lab findings, though difficult to ascertain whether the patient has symptoms at this point.  He did have a fall, but there has been no clear MSK trauma or bleeds on imaging.  He is mildly uremic with last dialysis session on Friday which might be contributing somewhat as well, though he does not seem to need emergent dialysis at this time.  Low suspicion for other toxic or metabolic causes at this time.  He has mild wheezing on exam but is oxygenating well on room air.  We will continue chronic meds for dementia at this time. - Consider remdesivir versus molnupiravir 800 mg orally (PO) twice daily for 5 days.  I believe remdesivir needs to be started IV for the first 3 days.  Patient does not meet criteria for dexamethasone at this time. - Continue IV ceftriaxone for 3 days for UTI treatment. - PT/OT - Dialysis management as below - Continue donepezil 10 nightly, memantine 10 twice daily  #ESRD on MWF HD Last dialysis session was Friday.  No concerns for fistula infection at this time and it is intact.  BUN is 76, uncertain exactly what his ranges are as our last available labs are from 2 years ago.  Does not appear to need emergent dialysis based on clinical appearance, labs and volume assessment. - Consult nephrology for nonemergent dialysis - Continue calcium and Rena-Vite.  Daughter reported that the patient is not taking Sensipar or Renvela at home at this point, will hold those as calcium and Phos are WNL  and management per nephrology.  #HTN #Pulmonary hypertension #HFpEF #MVP with MR Last recorded echo was on 01/05/2019 showing EF 55 to 60% with indeterminate diastolic parameters, severe pulmonary hypertension pulmonary hypertension, and severe left/right atrial dilation as well as calcification and thickening of the mitral valve leaflets with regurgitation.  Per daughter, patient is not taking any antihypertensives or diuretics at home at this point.  He is only mildly hypertensive in the ED.  Some concern for increased pulmonary edema and possible bilateral mild pleural effusions on exam and imaging, but patient is oxygenating well on room air at this time.  Findings should improve with dialysis tomorrow and patient is currently clinically stable. - Follow-up BMP - Consider repeat TTE  #CAD #HLD #PAD Low concern for ACS at this point. - We will continue home Lipitor 20 mg daily  #Complete heart block s/p pacemaker placement #History of a flutter Patient was previously prescribed Eliquis low-dose for a flutter  though this seems to have been stopped sometime ago likely due to falls and 1 episode of subdural hemorrhage in 2021.  Pacemaker most recently interrogated on 08/14/2021 and was stable. - Continuous monitoring  #Hypothyroidism Continue home Synthroid 150 mcg daily  #T2DM This Seems to Be Historical Diagnosis with Most Recent A1c 5.5 in 2018.  Will defer CBG monitoring at this time.  #COPD #OSA These also seen to be historical diagnoses with most recent note about COPD back in 2012.  Patient not on any oxygen at baseline and not using any inhalers.  He also does not use a CPAP.  Diet: Heart Healthy VTE: SubQ Heparin IVF: None Code: Full  Prior to Admission Living Arrangement: Home, living with daughter Anticipated Discharge Location: Home Barriers to Discharge: PT/OT follow-up  Dispo: Admit patient to Observation with expected length of stay less than 2  midnights.  Signed: Linus Galas, MD Internal Medicine Resident PGY-1  11/16/2021, 7:57 PM

## 2021-11-16 NOTE — ED Triage Notes (Signed)
Patient bib by [facility] from home.Family called ems due to possible fall but are unsure. Pt has dementia at baseline and lives alone. Family monitors pt via cameras in the house. Per ems pt ambulated with steady gait using walker to the ambulance. No weakness or gait abnormality noted. Pt is a dialysis pt  with fistula in left arm going M/W/F. Did not go today. VSS w/ EMS.

## 2021-11-17 ENCOUNTER — Ambulatory Visit: Payer: Medicare Other | Admitting: Neurology

## 2021-11-17 DIAGNOSIS — E1151 Type 2 diabetes mellitus with diabetic peripheral angiopathy without gangrene: Secondary | ICD-10-CM | POA: Diagnosis present

## 2021-11-17 DIAGNOSIS — F0390 Unspecified dementia without behavioral disturbance: Secondary | ICD-10-CM | POA: Diagnosis present

## 2021-11-17 DIAGNOSIS — I272 Pulmonary hypertension, unspecified: Secondary | ICD-10-CM | POA: Diagnosis present

## 2021-11-17 DIAGNOSIS — E119 Type 2 diabetes mellitus without complications: Secondary | ICD-10-CM | POA: Diagnosis not present

## 2021-11-17 DIAGNOSIS — R531 Weakness: Secondary | ICD-10-CM | POA: Diagnosis not present

## 2021-11-17 DIAGNOSIS — J449 Chronic obstructive pulmonary disease, unspecified: Secondary | ICD-10-CM

## 2021-11-17 DIAGNOSIS — Z950 Presence of cardiac pacemaker: Secondary | ICD-10-CM | POA: Diagnosis not present

## 2021-11-17 DIAGNOSIS — G4733 Obstructive sleep apnea (adult) (pediatric): Secondary | ICD-10-CM | POA: Diagnosis present

## 2021-11-17 DIAGNOSIS — I120 Hypertensive chronic kidney disease with stage 5 chronic kidney disease or end stage renal disease: Secondary | ICD-10-CM | POA: Diagnosis not present

## 2021-11-17 DIAGNOSIS — K4090 Unilateral inguinal hernia, without obstruction or gangrene, not specified as recurrent: Secondary | ICD-10-CM | POA: Diagnosis present

## 2021-11-17 DIAGNOSIS — G9341 Metabolic encephalopathy: Secondary | ICD-10-CM | POA: Diagnosis present

## 2021-11-17 DIAGNOSIS — N3001 Acute cystitis with hematuria: Secondary | ICD-10-CM

## 2021-11-17 DIAGNOSIS — E1122 Type 2 diabetes mellitus with diabetic chronic kidney disease: Secondary | ICD-10-CM | POA: Diagnosis present

## 2021-11-17 DIAGNOSIS — E785 Hyperlipidemia, unspecified: Secondary | ICD-10-CM | POA: Diagnosis present

## 2021-11-17 DIAGNOSIS — I5030 Unspecified diastolic (congestive) heart failure: Secondary | ICD-10-CM | POA: Diagnosis not present

## 2021-11-17 DIAGNOSIS — I340 Nonrheumatic mitral (valve) insufficiency: Secondary | ICD-10-CM | POA: Diagnosis present

## 2021-11-17 DIAGNOSIS — I132 Hypertensive heart and chronic kidney disease with heart failure and with stage 5 chronic kidney disease, or end stage renal disease: Secondary | ICD-10-CM | POA: Diagnosis present

## 2021-11-17 DIAGNOSIS — R410 Disorientation, unspecified: Secondary | ICD-10-CM | POA: Diagnosis present

## 2021-11-17 DIAGNOSIS — N186 End stage renal disease: Secondary | ICD-10-CM | POA: Diagnosis present

## 2021-11-17 DIAGNOSIS — U071 COVID-19: Secondary | ICD-10-CM

## 2021-11-17 DIAGNOSIS — I341 Nonrheumatic mitral (valve) prolapse: Secondary | ICD-10-CM | POA: Diagnosis present

## 2021-11-17 DIAGNOSIS — Z992 Dependence on renal dialysis: Secondary | ICD-10-CM

## 2021-11-17 DIAGNOSIS — R4182 Altered mental status, unspecified: Secondary | ICD-10-CM | POA: Diagnosis not present

## 2021-11-17 DIAGNOSIS — E039 Hypothyroidism, unspecified: Secondary | ICD-10-CM

## 2021-11-17 DIAGNOSIS — M898X9 Other specified disorders of bone, unspecified site: Secondary | ICD-10-CM | POA: Diagnosis present

## 2021-11-17 DIAGNOSIS — Z79890 Hormone replacement therapy (postmenopausal): Secondary | ICD-10-CM | POA: Diagnosis not present

## 2021-11-17 DIAGNOSIS — I2510 Atherosclerotic heart disease of native coronary artery without angina pectoris: Secondary | ICD-10-CM | POA: Diagnosis present

## 2021-11-17 DIAGNOSIS — I442 Atrioventricular block, complete: Secondary | ICD-10-CM | POA: Diagnosis present

## 2021-11-17 DIAGNOSIS — Z7982 Long term (current) use of aspirin: Secondary | ICD-10-CM | POA: Diagnosis not present

## 2021-11-17 DIAGNOSIS — D631 Anemia in chronic kidney disease: Secondary | ICD-10-CM | POA: Diagnosis present

## 2021-11-17 DIAGNOSIS — N250 Renal osteodystrophy: Secondary | ICD-10-CM | POA: Diagnosis not present

## 2021-11-17 DIAGNOSIS — I5032 Chronic diastolic (congestive) heart failure: Secondary | ICD-10-CM | POA: Diagnosis not present

## 2021-11-17 DIAGNOSIS — N390 Urinary tract infection, site not specified: Secondary | ICD-10-CM | POA: Diagnosis present

## 2021-11-17 LAB — CBC
HCT: 32.4 % — ABNORMAL LOW (ref 39.0–52.0)
Hemoglobin: 10.7 g/dL — ABNORMAL LOW (ref 13.0–17.0)
MCH: 32.5 pg (ref 26.0–34.0)
MCHC: 33 g/dL (ref 30.0–36.0)
MCV: 98.5 fL (ref 80.0–100.0)
Platelets: 109 10*3/uL — ABNORMAL LOW (ref 150–400)
RBC: 3.29 MIL/uL — ABNORMAL LOW (ref 4.22–5.81)
RDW: 14.9 % (ref 11.5–15.5)
WBC: 12.7 10*3/uL — ABNORMAL HIGH (ref 4.0–10.5)
nRBC: 0 % (ref 0.0–0.2)

## 2021-11-17 LAB — VITAMIN B12: Vitamin B-12: 1367 pg/mL — ABNORMAL HIGH (ref 180–914)

## 2021-11-17 LAB — FOLATE: Folate: 16.9 ng/mL (ref >5.9)

## 2021-11-17 LAB — BRAIN NATRIURETIC PEPTIDE: B Natriuretic Peptide: 1263.3 pg/mL — ABNORMAL HIGH (ref 0.0–100.0)

## 2021-11-17 MED ORDER — CHLORHEXIDINE GLUCONATE CLOTH 2 % EX PADS: 6.0000 | MEDICATED_PAD | Freq: Every day | CUTANEOUS | Status: DC

## 2021-11-17 NOTE — Progress Notes (Addendum)
HD#0 Subjective:   Summary: Jeffrey Campbell is an 88 year old male with past medical history of HTN, HLD, T2DM, hypothyroidism, COPD, OSA, HFpEF, PAD, CAD, complete heart block s/p pacemaker, mitral valve prolapse with mitral regurgitation, ESRD on MWF HD, and dementia who presented with generalized weakness and confusion and was admitted for acute encephalopathy.  Overnight Events: No acute events  Today patient is doing well and eating his breakfast.  He denies any new or worsening issues.  He denied any shortness of breath or issues breathing.  He does not remember what brought him to the hospital exactly but does remember that he did come to the hospital yesterday and that he is still here.  Objective:  Vital signs in last 24 hours: Vitals:   11/17/21 1400 11/17/21 1444 11/17/21 1500 11/17/21 1600  BP: (!) 171/74  (!) 157/90 (!) 170/77  Pulse:    70  Resp: '15  12 15  '$ Temp:  98 F (36.7 C)    TempSrc:  Oral    SpO2:  100%  100%   Supplemental O2: Room Air SpO2: 100 %   Physical Exam:  Constitutional: well-appearing early gentleman sitting in bed, in no acute distress HENT: normocephalic atraumatic Neck: supple Cardiovascular: regular rate and rhythm, no m/r/g Pulmonary/Chest: normal work of breathing on room air, mildly decreased lung sounds at the bases Abdominal: soft, non-tender, non-distended MSK: normal bulk and tone Neurological: alert & oriented to person and situation Skin: warm and dry  There were no vitals filed for this visit.   Intake/Output Summary (Last 24 hours) at 11/17/2021 1647 Last data filed at 11/17/2021 0456 Gross per 24 hour  Intake 250 ml  Output --  Net 250 ml   Net IO Since Admission: 250 mL [11/17/21 1647]  Pertinent Labs:    Latest Ref Rng & Units 11/17/2021    4:10 AM 11/16/2021    1:33 PM 08/13/2019    6:10 PM  CBC  WBC 4.0 - 10.5 K/uL 12.7  14.2  8.0   Hemoglobin 13.0 - 17.0 g/dL 10.7  11.1  7.0   Hematocrit 39.0 - 52.0 % 32.4   34.5  23.3   Platelets 150 - 400 K/uL 109  120  193        Latest Ref Rng & Units 11/16/2021    9:28 PM 11/16/2021    1:33 PM 08/13/2019    6:10 PM  CMP  Glucose 70 - 99 mg/dL 142  150  90   BUN 8 - 23 mg/dL 81  76  54   Creatinine 0.61 - 1.24 mg/dL 8.37  8.25  6.95   Sodium 135 - 145 mmol/L 140  140  140   Potassium 3.5 - 5.1 mmol/L 3.9  3.8  3.9   Chloride 98 - 111 mmol/L 99  97  101   CO2 22 - 32 mmol/L '28  30  27   '$ Calcium 8.9 - 10.3 mg/dL 9.8  10.0  9.1   Total Protein 6.5 - 8.1 g/dL  6.8    Total Bilirubin 0.3 - 1.2 mg/dL  1.3    Alkaline Phos 38 - 126 U/L  63    AST 15 - 41 U/L  21    ALT 0 - 44 U/L  15      Imaging: No results found.  Assessment/Plan:   Principal Problem:   Encephalopathy acute   Patient Summary: Jeffrey Campbell is an 88 year old male with past medical history of HTN,  HLD, T2DM, hypothyroidism, COPD, OSA, HFpEF, PAD, CAD, complete heart block s/p pacemaker, mitral valve prolapse with mitral regurgitation, ESRD on MWF HD, and dementia who presented with generalized weakness and confusion and was admitted for acute encephalopathy.   #Acute metabolic encephalopathy #Generalized weakness #COVID #UTI #Dementia Patient is doing well today and although he is still a little confused he is more aware of her situation.  He is also eating well.  After discussion we do not think a course of molnupiravir be of any benefit. - Continue ceftriaxone for 3 days, day 2/3 - Continue donepezil 10 mg daily and memantine 10 mg twice daily - PT/OT recommend SNF  #ESRD on MWF HD Nephrology has been consulted and are coordinating dialysis. - Dialysis today - Continue Rena-vit  #HTN #Pulmonary hypertension #HFpEF #MVP with MR #CAD #HLD #PAD #Complete heart block s/p pacemaker placement #History of a flutter Last recorded echo was on 01/05/2019 showing EF 55 to 60% with indeterminate diastolic parameters, severe pulmonary hypertension pulmonary hypertension, and  severe left/right atrial dilation as well as calcification and thickening of the mitral valve leaflets with regurgitation.  Not on antihypertensives at home. Patient was previously prescribed Eliquis low-dose for a flutter but this was discontinued in 2021 after several falls and subdural hematoma.  Low concern for ACS at this point.  BNP of 1200 but patient does not look fluid overloaded and his BNP has been as high as 3900 in the past. - We will continue home Lipitor 20 mg daily  #Hypothyroidism Continue home Synthroid 150 mcg daily   #T2DM This Seems to Be Historical Diagnosis with Most Recent A1c 5.5 in 2018.  Will defer CBG monitoring at this time.   #COPD #OSA These also seen to be historical diagnoses with most recent note about COPD back in 2012.  Patient not on any oxygen at baseline and not using any inhalers.  He also does not use a CPAP.   Diet: Heart Healthy IVF: None,None VTE: Heparin Code: Full PT/OT recs: SNF for Subacute PT, Family Update: Spoke to the patient's daughter today about PT and OT's recommendation for SNF.  Due to a very negative experience with SNF involving her mother she will would like to avoid SNF and would rather have home health.   Dispo: Anticipated discharge pending further work-up with PT and OT to evaluate for possibility of home health.  [place] [facility] Internal Medicine Resident PGY-1 Pager: [phone]  Please contact the on call pager after 5 pm and on weekends at [phone].

## 2021-11-17 NOTE — Evaluation (Signed)
Physical Therapy Evaluation Patient Details Name: Jeffrey Campbell MRN: [id number] DOB: 1932/04/07 Today's Date: 11/17/2021  History of Present Illness  88 yo male presenting to ED on 8/21 with generalized weakness and confusion. admitted for acute infectious encephalopathy secondary to COVID infection plus possible UTI. PMH including HTN, HLD, T2DM, hypothyroidism, COPD, OSA, HFpEF, PAD, CAD, complete heart block s/p pacemaker, mitral valve prolapse with mitral regurgitation, ESRD on MWF HD, and dementia.  Clinical Impression  Pt admitted secondary to problem above with deficits below. Pt requiring max A +2 for bed mobility and min A to stand and take steps at EOB. LOB X2 and requiring up to mod A for stability. No family present so unsure of what support he has at home. Recommending SNF level therapies to increase independence and safety. If family able to provide necessary support, could consider max HH services. Will continue to follow acutely.        Recommendations for follow up therapy are one component of a multi-disciplinary discharge planning process, led by the attending physician.  Recommendations may be updated based on patient status, additional functional criteria and insurance authorization.  Follow Up Recommendations Skilled nursing-short term rehab (<3 hours/day) (unless family can provide 24/7 assist) Can patient physically be transported by private vehicle: Yes    Assistance Recommended at Discharge Frequent or constant Supervision/Assistance  Patient can return home with the following  A little help with walking and/or transfers;A little help with bathing/dressing/bathroom;Assistance with cooking/housework;Direct supervision/assist for medications management;Direct supervision/assist for financial management;Assist for transportation;Help with stairs or ramp for entrance    Equipment Recommendations Rolling walker (2 wheels)  Recommendations for Other Services        Functional Status Assessment Patient has had a recent decline in their functional status and demonstrates the ability to make significant improvements in function in a reasonable and predictable amount of time.     Precautions / Restrictions Precautions Precautions: Fall Restrictions Weight Bearing Restrictions: No      Mobility  Bed Mobility Overal bed mobility: Needs Assistance Bed Mobility: Supine to Sit, Sit to Supine     Supine to sit: Max assist, +2 for physical assistance Sit to supine: Max assist, +2 for physical assistance   General bed mobility comments: Difficulty sequencing and required max multimodal cues. ASsist for trunk and LE assist throughout.    Transfers Overall transfer level: Needs assistance Equipment used: Rolling walker (2 wheels) Transfers: Sit to/from Stand Sit to Stand: Min assist           General transfer comment: Min A for lift assist and steadying. In standing prior to returning to sitting, pt with LOB requiring mod A.    Ambulation/Gait Ambulation/Gait assistance: Min assist Gait Distance (Feet): 5 Feet Assistive device: Rolling walker (2 wheels) Gait Pattern/deviations: Step-through pattern, Decreased stride length Gait velocity: Decreased     General Gait Details: Very unsteady. Min A throughout  with LOB X1 using RW during gait within the room.  Stairs            Wheelchair Mobility    Modified Rankin (Stroke Patients Only)       Balance Overall balance assessment: Needs assistance Sitting-balance support: No upper extremity supported, Feet supported Sitting balance-Leahy Scale: Fair     Standing balance support: Bilateral upper extremity supported Standing balance-Leahy Scale: Poor Standing balance comment: REliant on UE and external support  Pertinent Vitals/Pain Pain Assessment Pain Assessment: Faces Faces Pain Scale: No hurt    Home Living Family/patient expects  to be discharged to:: Private residence Living Arrangements: Children Available Help at Discharge: Family;Available PRN/intermittently Type of Home: House             Additional Comments: Daughter stays part time and other time he has aides that come occasionally per notes. Unsure of home information as pt unable to report    Prior Function Prior Level of Function : Patient poor historian/Family not available             Mobility Comments: Pt reports he has used a RW before but unsure if he currently uses.       Hand Dominance        Extremity/Trunk Assessment   Upper Extremity Assessment Upper Extremity Assessment: Defer to OT evaluation    Lower Extremity Assessment Lower Extremity Assessment: Generalized weakness    Cervical / Trunk Assessment Cervical / Trunk Assessment: Kyphotic  Communication   Communication: HOH  Cognition Arousal/Alertness: Awake/alert Behavior During Therapy: Flat affect Overall Cognitive Status: No family/caregiver present to determine baseline cognitive functioning                                 General Comments: Unable to recall hospital name or city. Also unable to recall month or year. Poor memory and difficulty sequencing.        General Comments General comments (skin integrity, edema, etc.): no family present    Exercises     Assessment/Plan    PT Assessment Patient needs continued PT services  PT Problem List Decreased strength;Decreased activity tolerance;Decreased mobility;Decreased balance;Decreased cognition;Decreased knowledge of use of DME;Decreased safety awareness;Decreased knowledge of precautions       PT Treatment Interventions DME instruction;Stair training;Gait training;Therapeutic activities;Functional mobility training;Therapeutic exercise;Balance training;Patient/family education    PT Goals (Current goals can be found in the Care Plan section)  Acute Rehab PT Goals PT Goal  Formulation: Patient unable to participate in goal setting Time For Goal Achievement: 12/01/21 Potential to Achieve Goals: Good    Frequency Min 2X/week     Co-evaluation PT/OT/SLP Co-Evaluation/Treatment: Yes Reason for Co-Treatment: For patient/therapist safety;To address functional/ADL transfers PT goals addressed during session: Balance;Mobility/safety with mobility         AM-PAC PT "6 Clicks" Mobility  Outcome Measure Help needed turning from your back to your side while in a flat bed without using bedrails?: A Lot Help needed moving from lying on your back to sitting on the side of a flat bed without using bedrails?: Total Help needed moving to and from a bed to a chair (including a wheelchair)?: A Little Help needed standing up from a chair using your arms (e.g., wheelchair or bedside chair)?: A Lot Help needed to walk in hospital room?: A Little Help needed climbing 3-5 steps with a railing? : Total 6 Click Score: 12    End of Session Equipment Utilized During Treatment: Gait belt Activity Tolerance: Patient tolerated treatment well Patient left: in bed;with call bell/phone within reach (on stretcher in ED) Nurse Communication: Mobility status PT Visit Diagnosis: Unsteadiness on feet (R26.81);Muscle weakness (generalized) (M62.81);Difficulty in walking, not elsewhere classified (R26.2)    Time: 5631-4970 PT Time Calculation (min) (ACUTE ONLY): 18 min   Charges:   PT Evaluation $PT Eval Moderate Complexity: 1 Mod          T[place]ania G, PT,  DPT  Acute Rehabilitation Services  Office: [phone]   Rudean Hitt 11/17/2021, 12:25 PM

## 2021-11-17 NOTE — Plan of Care (Signed)

## 2021-11-17 NOTE — ED Notes (Signed)
Lunch order placed

## 2021-11-17 NOTE — Evaluation (Signed)
Occupational Therapy Evaluation Patient Details Name: Jeffrey Campbell MRN: [id number] DOB: 1933-01-09 Today's Date: 11/17/2021   History of Present Illness 88 yo male presenting to ED on 8/21 with generalized weakness and confusion. admitted for acute infectious encephalopathy secondary to COVID infection plus possible UTI. PMH including HTN, HLD, T2DM, hypothyroidism, COPD, OSA, HFpEF, PAD, CAD, complete heart block s/p pacemaker, mitral valve prolapse with mitral regurgitation, ESRD on MWF HD, and dementia.   Clinical Impression   Per chart review, pt was living with his daughter who provided support and organized aides for when she worked; unsure of PLOF as pt unable to provide information. Pt currently requiring Max A for ADLs and Min A +2 with RW  for functional transfer. Pt presenting with decreased cognition, balance, strength, and activity tolerance. Pt would benefit from further acute OT to facilitate safe dc. Recommend dc to SNF for further OT to optimize safety, independence with ADLs, and return to PLOF.     Recommendations for follow up therapy are one component of a multi-disciplinary discharge planning process, led by the attending physician.  Recommendations may be updated based on patient status, additional functional criteria and insurance authorization.   Follow Up Recommendations  Skilled nursing-short term rehab (<3 hours/day) (If family able to organize 24/7 support at home, then could dc to home and normal environment)    Assistance Recommended at Discharge Frequent or constant Supervision/Assistance  Patient can return home with the following A lot of help with walking and/or transfers;A lot of help with bathing/dressing/bathroom    Functional Status Assessment  Patient has had a recent decline in their functional status and demonstrates the ability to make significant improvements in function in a reasonable and predictable amount of time.  Equipment Recommendations   Other (comment) (Defer to next venue)    Recommendations for Other Services       Precautions / Restrictions Precautions Precautions: Fall Restrictions Weight Bearing Restrictions: No      Mobility Bed Mobility Overal bed mobility: Needs Assistance Bed Mobility: Supine to Sit, Sit to Supine     Supine to sit: Max assist, +2 for physical assistance Sit to supine: Max assist, +2 for physical assistance   General bed mobility comments: Difficulty sequencing and required max multimodal cues. ASsist for trunk and LE assist throughout.    Transfers Overall transfer level: Needs assistance Equipment used: Rolling walker (2 wheels) Transfers: Sit to/from Stand Sit to Stand: Min assist           General transfer comment: Min A for lift assist and steadying. In standing prior to returning to sitting, pt with LOB requiring mod A.      Balance Overall balance assessment: Needs assistance Sitting-balance support: No upper extremity supported, Feet supported Sitting balance-Leahy Scale: Fair     Standing balance support: Bilateral upper extremity supported Standing balance-Leahy Scale: Poor Standing balance comment: Reliant on UE and external support                           ADL either performed or assessed with clinical judgement   ADL Overall ADL's : Needs assistance/impaired                                       General ADL Comments: Max A for ADLs and functional mobility.     Vision  Perception     Praxis      Pertinent Vitals/Pain Pain Assessment Pain Assessment: Faces Faces Pain Scale: No hurt Pain Intervention(s): Monitored during session     Hand Dominance     Extremity/Trunk Assessment Upper Extremity Assessment Upper Extremity Assessment: Generalized weakness   Lower Extremity Assessment Lower Extremity Assessment: Defer to PT evaluation   Cervical / Trunk Assessment Cervical / Trunk Assessment:  Kyphotic   Communication Communication Communication: HOH   Cognition Arousal/Alertness: Awake/alert Behavior During Therapy: Flat affect Overall Cognitive Status: No family/caregiver present to determine baseline cognitive functioning                                 General Comments: Unable to recall hospital name or city. Also unable to recall month or year. Poor memory and difficulty sequencing.     General Comments  VSS and no family present    Exercises     Shoulder Instructions      Home Living Family/patient expects to be discharged to:: Private residence Living Arrangements: Children Available Help at Discharge: Family;Available PRN/intermittently Type of Home: House                           Additional Comments: Daughter stays part time and other time he has aides that come occasionally per notes. Unsure of home information as pt unable to report      Prior Functioning/Environment Prior Level of Function : Patient poor historian/Family not available             Mobility Comments: Pt reports he has used a RW before but unsure if he currently uses. ADLs Comments: Pt unable to report        OT Problem List: Decreased strength;Decreased range of motion;Impaired balance (sitting and/or standing);Decreased activity tolerance;Decreased knowledge of use of DME or AE;Decreased knowledge of precautions;Decreased cognition      OT Treatment/Interventions: Self-care/ADL training;Therapeutic exercise;Energy conservation;DME and/or AE instruction;Therapeutic activities;Patient/family education    OT Goals(Current goals can be found in the care plan section) Acute Rehab OT Goals Patient Stated Goal: Unstated OT Goal Formulation: Patient unable to participate in goal setting Time For Goal Achievement: 12/01/21 Potential to Achieve Goals: Good  OT Frequency: Min 2X/week    Co-evaluation PT/OT/SLP Co-Evaluation/Treatment: Yes Reason for  Co-Treatment: For patient/therapist safety;To address functional/ADL transfers PT goals addressed during session: Balance;Mobility/safety with mobility OT goals addressed during session: ADL's and self-care      AM-PAC OT "6 Clicks" Daily Activity     Outcome Measure Help from another person eating meals?: A Lot Help from another person taking care of personal grooming?: A Lot Help from another person toileting, which includes using toliet, bedpan, or urinal?: A Lot Help from another person bathing (including washing, rinsing, drying)?: A Lot Help from another person to put on and taking off regular upper body clothing?: A Lot Help from another person to put on and taking off regular lower body clothing?: A Lot 6 Click Score: 12   End of Session Nurse Communication: Mobility status  Activity Tolerance: Patient tolerated treatment well Patient left: in bed;with call bell/phone within reach  OT Visit Diagnosis: Unsteadiness on feet (R26.81);Other abnormalities of gait and mobility (R26.89);Muscle weakness (generalized) (M62.81)                Time: 2229-7989 OT Time Calculation (min): 17 min Charges:  OT General Charges $OT  Visit: 1 Visit OT Evaluation $OT Eval Moderate Complexity: 1 Mod  Kadien Lineman MSOT, OTR/L Acute Rehab Office: [place] 11/17/2021, 12:45 PM

## 2021-11-17 NOTE — Consult Note (Signed)
Renal Service Consult Note Jeffrey Campbell  Jeffrey Campbell 11/17/2021 Sol Blazing, MD Requesting Physician: Dr. Heber Ambrose  Reason for Consult: ESRD pt w/ COVID infection HPI: The patient is a 88 y.o. year-old w/ hx of CAD, ESRD on HD, HL, HTN, dementia, OSA, pHTN who presented to ED yesterday due to possible fall. Pt has dementia and lives along, family monitors using cameras in the house. Missed HD yesterday. Last HD Friday. Per family pt showing some general weakness following HD sessions and that he needed to use the WC more. Also pt had recent URI symptoms. In ED COVID was +, possible UTI. IV rocephin was started and small NS bolus given. Pt admitted. We are asked to see for dialysis.   Pt seen in ED room. Pt is lying flat, in no distress, on RA.  He has no c/o's. Answers mostly yes or no questions. Did not answer more complicated questions.   ROS - denies CP, no joint pain, no HA, no blurry vision, no rash, no diarrhea, no nausea/ vomiting   Past Medical History  Past Medical History:  Diagnosis Date   Anemia    low iron   BPH (benign prostatic hyperplasia)    CAD (coronary artery disease)    Carotid artery stenosis    1-39% bilateral carotid artery stenosis and < 50% stenosis in the right CCA by dopplers 06/2017   CKD (chronic kidney disease), stage III (HCC)    Stage 4   Diastolic dysfunction    Glaucoma    Graves disease    History of ETOH abuse    Hyperlipemia    Hypertension    Hyperthyroidism 08/26/10   radioactive iodine therapy    Memory loss    Mitral regurgitation echo 2015   mild   Multiple thyroid nodules    MVP (mitral valve prolapse) 11/2012   posterior MVP   OSA (obstructive sleep apnea)    upper airway resistance syndrome with RDI 18/hr - not on CPAP due to insurance not covering   Pre-diabetes    PUD (peptic ulcer disease)    Pulmonary hypertension ([place]) echo 2015   Group 2 with pulmonary venous HTN and Group 3 with OSA   Upper airway  resistance syndrome    Past Surgical History  Past Surgical History:  Procedure Laterality Date   APPENDECTOMY     AV FISTULA PLACEMENT Left 10/18/2017   Procedure: ARTERIOVENOUS (AV) FISTULA CREATION ARM;  Surgeon: Waynetta Sandy, MD;  Location: [facility];  Service: Vascular;  Laterality: Left;   CARDIAC CATHETERIZATION     CARDIAC CATHETERIZATION N/A 02/17/2015   Procedure: Right Heart Cath;  Surgeon: Larey Dresser, MD;  Location: [facility];  Service: Cardiovascular;  Laterality: N/A;   CARDIOVERSION N/A 02/08/2019   Procedure: CARDIOVERSION;  Surgeon: Donato Heinz, MD;  Location: [facility];  Service: Endoscopy;  Laterality: N/A;   ESOPHAGOGASTRODUODENOSCOPY (EGD) WITH PROPOFOL Left 10/16/2016   Procedure: ESOPHAGOGASTRODUODENOSCOPY (EGD) WITH PROPOFOL;  Surgeon: Ronnette Juniper, MD;  Location: [facility];  Service: Gastroenterology;  Laterality: Left;   heart catherization     HERNIA REPAIR  10/2009   IR RADIOLOGIST EVAL & MGMT  09/28/2016   IR RADIOLOGIST EVAL & MGMT  10/19/2016   IR RADIOLOGIST EVAL & MGMT  01/18/2017   PACEMAKER IMPLANT N/A 02/09/2019   Procedure: PACEMAKER IMPLANT;  Surgeon: Constance Haw, MD;  Location: [facility];  Service: Cardiovascular;  Laterality: N/A;   RIGHT HEART CATH  N/A 07/28/2016   Procedure: Right Heart Cath;  Surgeon: Larey Dresser, MD;  Location: [facility];  Service: Cardiovascular;  Laterality: N/A;   Family History  Family History  Problem Relation Age of Onset   Kidney failure Father    Hypertension Father    Colon cancer Mother    Colon cancer Brother    Lung disease Brother    Colon cancer Maternal Uncle    Social History  reports that he quit smoking about 37 years ago. His smoking use included cigarettes. He has a 80.00 pack-year smoking history. He has never used smokeless tobacco. He reports that he does not drink alcohol and does not use drugs. Allergies  Allergies  Allergen  Reactions   Cardura [Doxazosin] Shortness Of Breath and Other (See Comments)    Dizziness    Hydrochlorothiazide Shortness Of Breath and Other (See Comments)    Dizziness    Iodinated Contrast Media Other (See Comments)    "shut down his kidneys" Patient has stage III chronic kidney disease   Zocor [Simvastatin] Other (See Comments)    Kidney problems    Protonix [Pantoprazole] Other (See Comments)    Worsening kidney problems   Rocaltrol [Calcitriol] Other (See Comments)    Unknown reaction   Budesonide-Formoterol Fumarate Other (See Comments)    Dry mouth   Minodyl [Minoxidil] Other (See Comments)    Unknown raction   Tiotropium Bromide Monohydrate Other (See Comments)    Dry mouth   Tricor [Fenofibrate]     Unknown reaction   Home medications Prior to Admission medications   Medication Sig Start Date End Date Taking? Authorizing Provider  atorvastatin (LIPITOR) 20 MG tablet TAKE 1 BY MOUTH DAILY ABSOLUTE LAST REFILL WITHOUT OFFICE VISIT PLEASE CALL [phone] TO SCHEDULE Patient taking differently: Take 20 mg by mouth daily. 11/09/21  Yes Larey Dresser, MD  Brinzolamide-Brimonidine [facility]) 1-0.2 % SUSP Place 1 drop into both eyes 2 (two) times daily.    Yes [provider]  calcium acetate (PHOSLO) 667 MG capsule Take 667 mg by mouth See admin instructions. 667 mg 3 times daily with meals, 667 mg 2 times daily with snacks. 11/30/19  Yes [provider]  donepezil (ARICEPT) 10 MG tablet Take 1 tablet (10 mg total) by mouth at bedtime. 08/17/21  Yes Suzzanne Cloud, NP  doxazosin (CARDURA) 4 MG tablet Take 1 tablet (4 mg total) by mouth daily. Patient taking differently: Take 4 mg by mouth 2 (two) times daily. 02/12/19  Yes Simmons, Brittainy M, PA-C  levothyroxine (SYNTHROID, LEVOTHROID) 150 MCG tablet Take 150 mcg by mouth daily before breakfast.   Yes [provider]  memantine (NAMENDA) 10 MG tablet TAKE 1 TABLET BY MOUTH TWICE A DAY Patient  taking differently: Take 10 mg by mouth 2 (two) times daily. 01/19/21  Yes Suzzanne Cloud, NP  multivitamin (RENA-VIT) TABS tablet Take 1 tablet by mouth daily. 11/01/19  Yes [provider]  amLODipine (NORVASC) 10 MG tablet TAKE 1 TABLET BY MOUTH EVERY DAY Patient not taking: Reported on 11/17/2021 08/08/19   Lyda Jester M, PA-C  apixaban (ELIQUIS) 2.5 MG TABS tablet Take 1 tablet (2.5 mg total) by mouth 2 (two) times daily. Patient not taking: Reported on 11/17/2021 06/19/19   Larey Dresser, MD  hydrALAZINE (APRESOLINE) 100 MG tablet TAKE 1 TABLET BY MOUTH 3 TIMES DAILY. PLEASE MAKE YEARLY APPT WITH DR.TURNER FOR OCTOBER 1ST ATTEMPT Patient not taking: Reported on 11/17/2021 01/19/21  Sueanne Margarita, MD     Vitals:   11/17/21 0352 11/17/21 0715 11/17/21 0730 11/17/21 0916  BP: (!) 147/68  (!) 153/73 (!) 154/75  Pulse: 70 70  70  Resp: '16 17  15  '$ Temp: 98.8 F (37.1 C)   98.3 F (36.8 C)  TempSrc: Oral   Oral  SpO2: 97% 97%  99%   Exam Gen alert, no distress, elderly male lying flat Room air 99% No rash, cyanosis or gangrene Sclera anicteric, throat clear  No jvd or bruits Chest clear bilat to bases, no rales/ wheezing RRR no MRG Abd soft ntnd no mass or ascites +bs GU normal male MS no joint effusions or deformity Ext no LE or UE edema, no wounds or ulcers Neuro is alert, Ox 1 , nf    LUA AVF+ bruit   Home meds include - donepezil, doxazosin 4 qd, levothyroxine, memantine, prns   OP HD: MWF [facility] 4h  400/1.5   54.5kg  2/2 bath  Hep 3000  LUA AVF  - mircera 30 q 4, last 5/31 - calcitriol 1.0 ug po tiw - cinacalcet '120mg'$  tiw po - last HD 8/18, came off 54.7kg - last Hb 10.8 on 8/16    Na 140  K 3.9  CO 28 BUN 81  Cr 8.37   CXR - perihilar R > L infiltrates vs edema    BP 155/ 80 range, HR 70 RR 13   Assessment/ Plan: AMS / gen'd weakness - w/ underlying dementia. Pt admitted given possible fall, loss of appetite and gen'd weakness. Per pmd COVID + -  per pmd HTN/ vol - no vol ^/ edema on exam. CXR suggests maybe edema vs perihilar infiltrates. Pt is on room air, RR 12. Doubt vol overload. Get wts when in room. ESRD - on HD MWF. No strong indication for RRT today. Plan HD tomorrow.  Pyuria - possible UTI, started on Rocephin Anemia esrd - Hb 10-12 range, no esa needs at this time.  MBD ckd - CCa in range, phos in range. Cont sensipar and po vdra.  Dementia - very limited responses to general questions      Rob Jonnie Finner  MD 11/17/2021, 10:20 AM Recent Labs  Lab 11/16/21 1333 11/16/21 2128 11/17/21 0410  HGB 11.1*  --  10.7*  ALBUMIN 3.4* 3.2*  --   CALCIUM 10.0 9.8  --   PHOS  --  3.6  --   CREATININE 8.25* 8.37*  --   K 3.8 3.9  --

## 2021-11-17 NOTE — ED Notes (Signed)
Breakfast order placed ?

## 2021-11-18 ENCOUNTER — Encounter (HOSPITAL_COMMUNITY): Payer: Self-pay

## 2021-11-18 ENCOUNTER — Other Ambulatory Visit (HOSPITAL_COMMUNITY): Payer: Self-pay

## 2021-11-18 DIAGNOSIS — Z950 Presence of cardiac pacemaker: Secondary | ICD-10-CM

## 2021-11-18 DIAGNOSIS — N390 Urinary tract infection, site not specified: Secondary | ICD-10-CM | POA: Diagnosis not present

## 2021-11-18 DIAGNOSIS — I341 Nonrheumatic mitral (valve) prolapse: Secondary | ICD-10-CM

## 2021-11-18 DIAGNOSIS — I5032 Chronic diastolic (congestive) heart failure: Secondary | ICD-10-CM

## 2021-11-18 DIAGNOSIS — G9341 Metabolic encephalopathy: Secondary | ICD-10-CM | POA: Diagnosis not present

## 2021-11-18 DIAGNOSIS — I272 Pulmonary hypertension, unspecified: Secondary | ICD-10-CM

## 2021-11-18 DIAGNOSIS — Z87891 Personal history of nicotine dependence: Secondary | ICD-10-CM

## 2021-11-18 DIAGNOSIS — R531 Weakness: Secondary | ICD-10-CM | POA: Diagnosis not present

## 2021-11-18 DIAGNOSIS — E119 Type 2 diabetes mellitus without complications: Secondary | ICD-10-CM

## 2021-11-18 DIAGNOSIS — U071 COVID-19: Secondary | ICD-10-CM | POA: Diagnosis not present

## 2021-11-18 DIAGNOSIS — I739 Peripheral vascular disease, unspecified: Secondary | ICD-10-CM

## 2021-11-18 DIAGNOSIS — E785 Hyperlipidemia, unspecified: Secondary | ICD-10-CM

## 2021-11-18 DIAGNOSIS — I442 Atrioventricular block, complete: Secondary | ICD-10-CM

## 2021-11-18 DIAGNOSIS — I2510 Atherosclerotic heart disease of native coronary artery without angina pectoris: Secondary | ICD-10-CM

## 2021-11-18 LAB — HEPATITIS B SURFACE ANTIBODY,QUALITATIVE: Hep B S Ab: REACTIVE — AB

## 2021-11-18 LAB — RENAL FUNCTION PANEL
Albumin: 3.1 g/dL — ABNORMAL LOW (ref 3.5–5.0)
Anion gap: 16 — ABNORMAL HIGH (ref 5–15)
BUN: 97 mg/dL — ABNORMAL HIGH (ref 8–23)
CO2: 26 mmol/L (ref 22–32)
Calcium: 9.8 mg/dL (ref 8.9–10.3)
Chloride: 100 mmol/L (ref 98–111)
Creatinine, Ser: 8.91 mg/dL — ABNORMAL HIGH (ref 0.61–1.24)
GFR, Estimated: 5 mL/min — ABNORMAL LOW (ref >60)
Glucose, Bld: 108 mg/dL — ABNORMAL HIGH (ref 70–99)
Phosphorus: 4.2 mg/dL (ref 2.5–4.6)
Potassium: 3.8 mmol/L (ref 3.5–5.1)
Sodium: 142 mmol/L (ref 135–145)

## 2021-11-18 LAB — CBC
HCT: 32.2 % — ABNORMAL LOW (ref 39.0–52.0)
Hemoglobin: 10.8 g/dL — ABNORMAL LOW (ref 13.0–17.0)
MCH: 33 pg (ref 26.0–34.0)
MCHC: 33.5 g/dL (ref 30.0–36.0)
MCV: 98.5 fL (ref 80.0–100.0)
Platelets: 111 10*3/uL — ABNORMAL LOW (ref 150–400)
RBC: 3.27 MIL/uL — ABNORMAL LOW (ref 4.22–5.81)
RDW: 14.8 % (ref 11.5–15.5)
WBC: 10.8 10*3/uL — ABNORMAL HIGH (ref 4.0–10.5)
nRBC: 0 % (ref 0.0–0.2)

## 2021-11-18 LAB — HEPATITIS C ANTIBODY: HCV Ab: NONREACTIVE

## 2021-11-18 LAB — HEPATITIS B SURFACE ANTIGEN: Hepatitis B Surface Ag: NONREACTIVE

## 2021-11-18 LAB — HEPATITIS B CORE ANTIBODY, TOTAL: Hep B Core Total Ab: REACTIVE — AB

## 2021-11-18 MED ORDER — CINACALCET HCL 30 MG PO TABS
120.0000 mg | ORAL_TABLET | ORAL | Status: DC
  Administered 2021-11-18: 120 mg via ORAL
  Filled 2021-11-18: qty 4

## 2021-11-18 MED ORDER — CALCITRIOL 0.5 MCG PO CAPS
1.0000 ug | ORAL_CAPSULE | ORAL | Status: DC
  Administered 2021-11-18: 1 ug via ORAL
  Filled 2021-11-18: qty 2

## 2021-11-18 MED ORDER — CEFDINIR 300 MG PO CAPS
300.0000 mg | ORAL_CAPSULE | Freq: Once | ORAL | 0 refills | Status: AC
  Filled 2021-11-18: qty 1, 1d supply, fill #0

## 2021-11-18 MED ORDER — CEFDINIR 300 MG PO CAPS
300.0000 mg | ORAL_CAPSULE | Freq: Every day | ORAL | Status: DC
  Administered 2021-11-18: 300 mg via ORAL
  Filled 2021-11-18: qty 1

## 2021-11-18 MED ORDER — HEPARIN SODIUM (PORCINE) 1000 UNIT/ML IJ SOLN
INTRAMUSCULAR | Status: AC
  Administered 2021-11-18: 3000 [IU] via INTRAVENOUS_CENTRAL
  Filled 2021-11-18: qty 3

## 2021-11-18 MED ORDER — HEPARIN SODIUM (PORCINE) 1000 UNIT/ML DIALYSIS: 3000.0000 [IU] | Freq: Once | INTRAMUSCULAR | Status: AC

## 2021-11-18 NOTE — Progress Notes (Signed)
[facility] Progress Note  Subjective: pt seen in room, on HD now, no c/o  Vitals:   11/18/21 0834 11/18/21 0845 11/18/21 0900 11/18/21 0916  BP: (!) 154/65 (!) 153/71 (!) 141/70 (!) 141/72  Pulse: 70 70 70 66  Resp: '17 17 14 14  '$ Temp:      TempSrc:      SpO2: 100% 99% 98% 98%  Weight:      Height:        Exam: Gen alert, no distress, elderly male No jvd or bruits Chest clear bilat to bases RRR no MRG Abd soft ntnd no mass or ascites +bs Ext no LE or UE edema Neuro is alert, Ox 1 , nf    LUA AVF+ bruit    Home meds include - donepezil, doxazosin 4 qd, levothyroxine, memantine, prns    OP HD: MWF [facility] 4h  400/1.5   54.5kg  2/2 bath  Hep 3000  LUA AVF  - mircera 30 q 4, last 5/31 - calcitriol 1.0 ug po tiw - cinacalcet '120mg'$  tiw po - last HD 8/18, came off 54.7kg - last Hb 10.8 on 8/16     Na 140  K 3.9  CO 28 BUN 81  Cr 8.37   CXR - perihilar R > L infiltrates vs edema    BP 155/ 80 range, HR 70 RR 13   Assessment/ Plan: AMS / gen'd weakness - w/ underlying dementia. Pt admitted given possible fall, loss of appetite and gen'd weakness. Per pmd COVID + - per pmd HTN/ vol - no vol ^/ edema on exam. CXR suggests maybe edema vs perihilar infiltrates. Pt is on room air, euvolemic on exam. 2 L UFG w/ HD today.  ESRD - on HD MWF. Missed HD Monday. HD today on schedule.  Pyuria - possible UTI, started on Rocephin Anemia esrd - Hb 10-12 range, no esa needs at this time.  MBD ckd - CCa in range, phos in range. Cont sensipar and po vdra.  Dementia - very limited response to questioning         Kelly Splinter 11/18/2021, 9:31 AM   Recent Labs  Lab 11/16/21 2128 11/17/21 0410 11/18/21 0047  HGB  --  10.7* 10.8*  ALBUMIN 3.2*  --  3.1*  CALCIUM 9.8  --  9.8  PHOS 3.6  --  4.2  CREATININE 8.37*  --  8.91*  K 3.9  --  3.8   No results for input(s): "IRON", "TIBC", "FERRITIN" in the last 168 hours. Inpatient medications:  atorvastatin  20 mg Oral Daily    Chlorhexidine Gluconate Cloth  6 each Topical Q0600   donepezil  10 mg Oral QHS   doxazosin  4 mg Oral Daily   heparin  5,000 Units Subcutaneous Q8H   levothyroxine  150 mcg Oral QAC breakfast   memantine  10 mg Oral BID    cefTRIAXone (ROCEPHIN)  IV 1 g (11/17/21 2116)   acetaminophen **OR** acetaminophen

## 2021-11-18 NOTE — Discharge Summary (Addendum)
Name: Jeffrey Campbell MRN: [id number] DOB: [birth year] 88 y.o. PCP: [name], MD (Inactive)  Date of Admission: 11/16/2021 11:19 AM Date of Discharge: 11/18/2021 Attending Physician: Dr. Angelia Mould  Discharge Diagnosis: Principal Problem:   Encephalopathy acute Active Problems:   Type 2 diabetes, diet controlled ([place])   ESRD (end stage renal disease) ([place])   COVID-19   Acute cystitis with hematuria    Discharge Medications: Allergies as of 11/18/2021       Reactions   Cardura [doxazosin] Shortness Of Breath, Other (See Comments)   Dizziness    Hydrochlorothiazide Shortness Of Breath, Other (See Comments)   Dizziness    Iodinated Contrast Media Other (See Comments)   "shut down his kidneys" Patient has stage III chronic kidney disease   Zocor [simvastatin] Other (See Comments)   Kidney problems    Protonix [pantoprazole] Other (See Comments)   Worsening kidney problems   Rocaltrol [calcitriol] Other (See Comments)   Unknown reaction   Budesonide-formoterol Fumarate Other (See Comments)   Dry mouth   Minodyl [minoxidil] Other (See Comments)   Unknown raction   Tiotropium Bromide Monohydrate Other (See Comments)   Dry mouth   Tricor [fenofibrate]    Unknown reaction        Medication List     STOP taking these medications    amLODipine 10 MG tablet Commonly known as: NORVASC       TAKE these medications    atorvastatin 20 MG tablet Commonly known as: LIPITOR TAKE 1 BY MOUTH DAILY ABSOLUTE LAST REFILL WITHOUT OFFICE VISIT PLEASE CALL [phone] TO SCHEDULE What changed: See the new instructions.   calcium acetate 667 MG capsule Commonly known as: PHOSLO Take 667 mg by mouth See admin instructions. 667 mg 3 times daily with meals, 667 mg 2 times daily with snacks.   cefdinir 300 MG capsule Commonly known as: OMNICEF Take 1 capsule (300 mg total) by mouth once for 1 dose. Take after dialysis on Friday, 11/20/2021 Start taking on: November 20, 2021   donepezil 10 MG tablet Commonly known as: ARICEPT Take 1 tablet (10 mg total) by mouth at bedtime.   doxazosin 4 MG tablet Commonly known as: CARDURA Take 1 tablet (4 mg total) by mouth daily. What changed: when to take this   levothyroxine 150 MCG tablet Commonly known as: SYNTHROID Take 150 mcg by mouth daily before breakfast.   memantine 10 MG tablet Commonly known as: NAMENDA TAKE 1 TABLET BY MOUTH TWICE A DAY   multivitamin Tabs tablet Take 1 tablet by mouth daily.   Simbrinza 1-0.2 % Susp Generic drug: Brinzolamide-Brimonidine Place 1 drop into both eyes 2 (two) times daily.               Durable Medical Equipment  (From admission, onward)           Start     Ordered   11/18/21 1350  DME Walker  Once       Comments: Rolling walker  Question Answer Comment  Walker: With 5 Inch Wheels   Patient needs a walker to treat with the following condition Weakness   Patient needs a walker to treat with the following condition Physical deconditioning      11/18/21 1352            Disposition and follow-up:   Mr.[name] was discharged from [facility] in Good condition.  At the hospital follow up visit please address:  1.  Follow-up:  a.  Please follow-up resolution of urinary tract infection, he was prescribed cefdinir to take on dialysis days postdialysis with his last dose on November 20, 2021 to complete a 5-day course of antibiotics.    b.  Please follow-up on resolution of acute weakness and confusion.   c.  Please monitor for any progression of COVID symptoms.  2.  Labs / imaging needed at time of follow-up: Urinalysis  3.  Pending labs/ test needing follow-up: Hep B surface antibody quant  4.  Medication Changes  Abx -  cefdinir 300 mg End Date: 11/20/2021  Follow-up Appointments:  Follow-up Information     [name], MD. Call.   Specialty: Family Medicine Contact information: [place] [address] [phone]         Sueanne Margarita, MD .   Specialty: Cardiology Contact information: [phone] [place] [address] [phone]                 Hospital Course by problem list: Jeffrey Campbell is an 88 year old male with past medical history of HTN, HLD, T2DM, hypothyroidism, COPD, OSA, HFpEF, PAD, CAD, complete heart block s/p pacemaker, mitral valve prolapse with mitral regurgitation, ESRD on MWF HD, and dementia who presented with generalized weakness and confusion and was admitted for acute encephalopathy.   #Acute metabolic encephalopathy #Generalized weakness #COVID #UTI #Dementia Patient presented with acute onset generalized weakness and confusion along with loss of appetite.  He was found to be COVID-positive and had a UA concerning for UTI.  Both of which could contribute to his symptoms.  On admission we treated him with IV ceftriaxone but did not start any COVID viral medication.  He did not require any oxygen and was not showing any signs of symptomatic COVID infection despite the possible confusion and weakness.  Overnight he did well and the next morning he was eating breakfast and denied any new or worsening issues.  He continued to show improvement and got dialysis on his normal schedule of Wednesday after being evaluated by nephrology on Tuesday.  PT and OT evaluated him and recommended SNF however family has had a negative experience with SNF in the past and would like to avoid it so we ordered home health PT and OT on discharge.  He will have constant supervision at home with his daughter and an aide.  We discharged home with cefdinir 300 mg on dialysis days postdialysis to complete a 5-day course of antibiotic therapy for UTI.    #ESRD on MWF HD On admission he had to miss dialysis because he was in the emergency department.  We consulted nephrology and they saw the patient Tuesday and decided he was safe to receive  dialysis again on Wednesday which he tolerated well.  After speaking with the daughter it is apparent that the outpatient dialysis center has a good plan for him to stay on his dialysis schedule although he is COVID-positive.   #HTN #Pulmonary hypertension #HFpEF #MVP with MR #CAD #HLD #PAD #Complete heart block s/p pacemaker placement #History of a flutter Last recorded echo was on 01/05/2019 showing EF 55 to 60% with indeterminate diastolic parameters, severe pulmonary hypertension pulmonary hypertension, and severe left/right atrial dilation as well as calcification and thickening of the mitral valve leaflets with regurgitation.  Not on antihypertensives at home. Patient was previously prescribed Eliquis low-dose for a flutter but this was discontinued in 2021 after several falls and subdural hematoma.  Low concern  for ACS during this admission.  BNP of 1200 but patient does not look fluid overloaded and his BNP has been as high as 3900 in the past.  Continued his home Lipitor 20 mg daily.   #Hypothyroidism Continued home Synthroid 150 mcg daily.   #T2DM This Seems to Be Historical Diagnosis with Most Recent A1c 5.5 in 2018.  Blood sugars during admission were within normal limits.   #COPD #OSA These also seem to be historical diagnoses with most recent note about COPD back in 2012.  Patient not on any oxygen at baseline and not using any inhalers.  He also does not use a CPAP.   Discharge Subjective: WILBURT MESSINA is an 88 year old male with past medical history of HTN, HLD, T2DM, hypothyroidism, COPD, OSA, HFpEF, PAD, CAD, complete heart block s/p pacemaker, mitral valve prolapse with mitral regurgitation, ESRD on MWF HD, and dementia who presented with generalized weakness and confusion and was admitted for acute encephalopathy.  Patient is doing well today and continues to show interval improvement in confusion and overall status.  His daughter was in the room and is able to confirm  that he is usually oriented to self and intermittently oriented to place and situation and less so time.  He does appear to be near his baseline.  Discharge Exam:   BP (!) 126/58 (BP Location: Right Arm)   Pulse 70   Temp 97.6 F (36.4 C) (Oral)   Resp 18   Ht '5\' 9"'$  (1.753 m)   Wt 54 kg   SpO2 100%   BMI 17.58 kg/m  Constitutional: well-appearing early gentleman sitting in bed, in no acute distress HENT: normocephalic atraumatic Cardiovascular: regular rate and rhythm Pulmonary/Chest: normal work of breathing on room air MSK: normal bulk and tone Neurological: alert & oriented to person and situation Skin: warm and dry  Pertinent Labs, Studies, and Procedures:     Latest Ref Rng & Units 11/18/2021   12:47 AM 11/17/2021    4:10 AM 11/16/2021    1:33 PM  CBC  WBC 4.0 - 10.5 K/uL 10.8  12.7  14.2   Hemoglobin 13.0 - 17.0 g/dL 10.8  10.7  11.1   Hematocrit 39.0 - 52.0 % 32.2  32.4  34.5   Platelets 150 - 400 K/uL 111  109  120        Latest Ref Rng & Units 11/18/2021   12:47 AM 11/16/2021    9:28 PM 11/16/2021    1:33 PM  CMP  Glucose 70 - 99 mg/dL 108  142  150   BUN 8 - 23 mg/dL 97  81  76   Creatinine 0.61 - 1.24 mg/dL 8.91  8.37  8.25   Sodium 135 - 145 mmol/L 142  140  140   Potassium 3.5 - 5.1 mmol/L 3.8  3.9  3.8   Chloride 98 - 111 mmol/L 100  99  97   CO2 22 - 32 mmol/L '26  28  30   '$ Calcium 8.9 - 10.3 mg/dL 9.8  9.8  10.0   Total Protein 6.5 - 8.1 g/dL   6.8   Total Bilirubin 0.3 - 1.2 mg/dL   1.3   Alkaline Phos 38 - 126 U/L   63   AST 15 - 41 U/L   21   ALT 0 - 44 U/L   15     CT HEAD WO CONTRAST  Result Date: 11/16/2021 CLINICAL DATA:  Fall EXAM: CT HEAD WITHOUT CONTRAST CT CERVICAL  SPINE WITHOUT CONTRAST TECHNIQUE: Multidetector CT imaging of the head and cervical spine was performed following the standard protocol without intravenous contrast. Multiplanar CT image reconstructions of the cervical spine were also generated. RADIATION DOSE REDUCTION: This  exam was performed according to the departmental dose-optimization program which includes automated exposure control, adjustment of the mA and/or kV according to patient size and/or use of iterative reconstruction technique. COMPARISON:  None Available. FINDINGS: CT HEAD FINDINGS Brain: Chronic white matter ischemic change. No evidence of acute infarction, hemorrhage, hydrocephalus, extra-axial collection or mass lesion/mass effect. Vascular: No hyperdense vessel or unexpected calcification. Skull: Normal. Negative for fracture or focal lesion. Sinuses/Orbits: No acute finding. Other: None. CT CERVICAL SPINE FINDINGS Alignment: Normal. Skull base and vertebrae: No acute fracture. No primary bone lesion or focal pathologic process. Soft tissues and spinal canal: No prevertebral fluid or swelling. No visible canal hematoma. Disc levels:  Moderate multilevel degenerative disc disease. Upper chest: Negative. Other: None. IMPRESSION: 1. No acute intracranial abnormality. 2. No evidence of acute cervical spine fracture or traumatic malalignment. Electronically Signed   By: Yetta Glassman M.D.   On: 11/16/2021 15:08   CT CERVICAL SPINE WO CONTRAST  Result Date: 11/16/2021 CLINICAL DATA:  Fall EXAM: CT HEAD WITHOUT CONTRAST CT CERVICAL SPINE WITHOUT CONTRAST TECHNIQUE: Multidetector CT imaging of the head and cervical spine was performed following the standard protocol without intravenous contrast. Multiplanar CT image reconstructions of the cervical spine were also generated. RADIATION DOSE REDUCTION: This exam was performed according to the departmental dose-optimization program which includes automated exposure control, adjustment of the mA and/or kV according to patient size and/or use of iterative reconstruction technique. COMPARISON:  None Available. FINDINGS: CT HEAD FINDINGS Brain: Chronic white matter ischemic change. No evidence of acute infarction, hemorrhage, hydrocephalus, extra-axial collection or mass  lesion/mass effect. Vascular: No hyperdense vessel or unexpected calcification. Skull: Normal. Negative for fracture or focal lesion. Sinuses/Orbits: No acute finding. Other: None. CT CERVICAL SPINE FINDINGS Alignment: Normal. Skull base and vertebrae: No acute fracture. No primary bone lesion or focal pathologic process. Soft tissues and spinal canal: No prevertebral fluid or swelling. No visible canal hematoma. Disc levels:  Moderate multilevel degenerative disc disease. Upper chest: Negative. Other: None. IMPRESSION: 1. No acute intracranial abnormality. 2. No evidence of acute cervical spine fracture or traumatic malalignment. Electronically Signed   By: Yetta Glassman M.D.   On: 11/16/2021 15:08   DG Thoracic Spine W/Swimmers  Result Date: 11/16/2021 CLINICAL DATA:  Status post fall. EXAM: THORACIC SPINE - 3 VIEWS COMPARISON:  Chest radiographs and CT cervical spine 08/13/2019. FINDINGS: There are 12 rib-bearing thoracic type vertebral bodies. The alignment is normal. No evidence of acute fracture, paraspinal hematoma or widening of the interpedicular distance. There are mild degenerative changes within the visualized cervicothoracic spine. Patient has a pacemaker. IMPRESSION: No evidence of acute thoracic spine injury.  Mild spondylosis. Electronically Signed   By: Richardean Sale M.D.   On: 11/16/2021 13:26   DG Chest 2 View  Result Date: 11/16/2021 CLINICAL DATA:  Altered mental status.  Status post fall. EXAM: CHEST - 2 VIEW COMPARISON:  Radiographs 08/13/2019.  CT 05/12/2016. FINDINGS: Right subclavian pacemaker leads extend into the right atrium and right ventricle. The heart is mildly enlarged. There is aortic atherosclerosis. There is increased vascular congestion and probable mild pulmonary edema. There may be small bilateral pleural effusions. No confluent airspace opacity or pneumothorax. No acute osseous findings are evident. IMPRESSION: Cardiomegaly with increased vascular congestion and  possible  pulmonary edema. Electronically Signed   By: Richardean Sale M.D.   On: 11/16/2021 13:24   DG Forearm Right  Result Date: 11/16/2021 CLINICAL DATA:  Status post fall. EXAM: RIGHT FOREARM - 2 VIEW COMPARISON:  None Available. FINDINGS: The bones appear adequately mineralized. No evidence of acute fracture, dislocation or significant elbow joint effusion. Mild radiocarpal degenerative changes and olecranon spurring. Scattered vascular calcifications are noted. No evidence of foreign body or soft tissue emphysema. IMPRESSION: No evidence of acute fracture or dislocation. Electronically Signed   By: Richardean Sale M.D.   On: 11/16/2021 13:22     Discharge Instructions: Discharge Instructions     Call MD for:  difficulty breathing, headache or visual disturbances   Complete by: As directed    Call MD for:  extreme fatigue   Complete by: As directed    Call MD for:  hives   Complete by: As directed    Call MD for:  persistant dizziness or light-headedness   Complete by: As directed    Call MD for:  persistant nausea and vomiting   Complete by: As directed    Call MD for:  severe uncontrolled pain   Complete by: As directed    Call MD for:  temperature >100.4   Complete by: As directed    Diet - low sodium heart healthy   Complete by: As directed    Increase activity slowly   Complete by: As directed        Signed: Johny Blamer, DO 11/18/2021, 2:06 PM   Pager: [phone]

## 2021-11-18 NOTE — TOC Transition Note (Addendum)
Transition of Care [facility]) - CM/SW Discharge Note   Patient Details  Name: Jeffrey Campbell MRN: [id number] Date of Birth: 1932-04-09  Transition of [facility] CM/SW Contact:  Sharin Mons, RN Phone Number: 11/18/2021, 4:30 PM   Clinical Narrative:    Patient will DC to: home Anticipated DC date: 11/18/2021 Family notified: yes, Cabin crew by: car  Admitted with acute encephalopathy/ positive COVID, hx of of HTN, HLD, DM, hypothyroidism, COPD, OSA, HFpEF, PAD, CAD, complete heart block s/p pacemaker, mitral valve prolapse with mitral regurgitation, ESRD on MWF HD, and dementia. From home with family.   Per MD patient ready for DC today. RN, patient, and  patient's daughter  notified of DC. SNF placement declined by daughter. Agreeable to home health services. States has used [facility] in the past and would like to use again. Referral made with [facility] and accepted. Pt without DME need, already has EW @ home. Post hospital f/u noted on AVS . [facility] delivered Rx Med to bedside prior to d/c. Consult received: Patient may need alternative dialysis options due to COVID +. Need assistance with this. Per renal navigator no changes are needed.  Daughter to provide transportation to home.  Jeffrey Campbell (daughter) [phone]   - RNCM will sign off for now as intervention is no longer needed. Please consult [place] again if new needs arise.  - Final next level of care: Home w Home Health Services Barriers to Discharge: No Barriers Identified   Patient Goals and CMS Choice        Discharge Placement                       Discharge Plan and Services                                     Social Determinants of Health (SDOH) Interventions     Readmission Risk Interventions     No data to display

## 2021-11-18 NOTE — Discharge Instructions (Addendum)
You were hospitalized for confusion and . Thank you for allowing [place] to be part of your care.   Please follow-up at your primary care office for hospital follow-up.  Please take cefdinir 300 mg once after dialysis on Friday  Please call our clinic if you have any questions or concerns, we may be able to help and keep you from a long and expensive emergency room wait. Our clinic and after hours phone number is [phone], the best time to call is Monday through Friday 9 am to 4 pm but there is always someone available 24/7 if you have an emergency. If you need medication refills please notify your pharmacy one week in advance and they will send [place] a request.

## 2021-11-18 NOTE — Progress Notes (Signed)
Post HD Note  Received patient in bed, alert and oriented. Informed consent signed and in chart.    Time tx initiated: 0834 Time tx completed: 1242  HD treatment completed. Patient tolerated well. Fistula/Graft/HD catheter without signs and symptoms of complications. Patient remain in the room, alert and orient  to self and in no acute distress. Report given to bedside RN.   Total UF removed: 1800 Medication given: heparin Post HD VS: 97.8, 70, 100%, 10R 137/64 Post HD weight: 54kg   Tamika Nou HassellPost HD Note  Received patient in bed, alert Informed consent signed and in chart.    Time tx initiated: 0834 Time tx completed: 1242  HD treatment completed. Patient tolerated well. Fistula/Graft/HD catheter without signs and symptoms of complications. Patient remained in room, alert in no acute distress. Report given to bedside RN.   Total UF removed: 1800 Medication given: heparin Post HD VS: 70, 100%, 137/64 Post HD weight: [place] Dialysis Nurse   Dialysis Nurse

## 2021-11-18 NOTE — Hospital Course (Signed)
Jeffrey Campbell is an 88 year old male with past medical history of HTN, HLD, T2DM, hypothyroidism, COPD, OSA, HFpEF, PAD, CAD, complete heart block s/p pacemaker, mitral valve prolapse with mitral regurgitation, ESRD on MWF HD, and dementia who presented with generalized weakness and confusion and was admitted for acute encephalopathy.   #Acute metabolic encephalopathy #Generalized weakness #COVID #UTI #Dementia Patient presented with acute onset generalized weakness and confusion along with loss of appetite.  He was found to be COVID-positive and had a UA concerning for UTI.  Both of which could contribute to his symptoms.  On admission we treated him with IV ceftriaxone but did not start any COVID viral medication.  He did not require any oxygen and was not showing any signs of symptomatic COVID infection despite the possible confusion and weakness.  Overnight he did well and the next morning he was eating breakfast and denied any new or worsening issues.  He continued to show improvement and got dialysis on his normal schedule of Wednesday after being evaluated by nephrology on Tuesday.  PT and OT evaluated him and recommended SNF however family has had a negative experience with SNF in the past and would like to avoid it so we ordered home health PT and OT on discharge.  He will have constant supervision at home with his daughter and an aide.  We discharged home with cefdinir 300 mg on dialysis days postdialysis to complete a 5-day course of antibiotic therapy for UTI.    #ESRD on MWF HD On admission he had to miss dialysis because he was in the emergency department.  We consulted nephrology and they saw the patient Tuesday and decided he was safe to receive dialysis again on Wednesday which he tolerated well.  After speaking with the daughter it is apparent that the outpatient dialysis center has a good plan for him to stay on his dialysis schedule although he is COVID-positive.   #HTN #Pulmonary  hypertension #HFpEF #MVP with MR #CAD #HLD #PAD #Complete heart block s/p pacemaker placement #History of a flutter Last recorded echo was on 01/05/2019 showing EF 55 to 60% with indeterminate diastolic parameters, severe pulmonary hypertension pulmonary hypertension, and severe left/right atrial dilation as well as calcification and thickening of the mitral valve leaflets with regurgitation.  Not on antihypertensives at home. Patient was previously prescribed Eliquis low-dose for a flutter but this was discontinued in 2021 after several falls and subdural hematoma.  Low concern for ACS during this admission.  BNP of 1200 but patient does not look fluid overloaded and his BNP has been as high as 3900 in the past.  Continued his home Lipitor 20 mg daily.   #Hypothyroidism Continued home Synthroid 150 mcg daily.   #T2DM This Seems to Be Historical Diagnosis with Most Recent A1c 5.5 in 2018.  Blood sugars during admission were within normal limits.   #COPD #OSA These also seem to be historical diagnoses with most recent note about COPD back in 2012.  Patient not on any oxygen at baseline and not using any inhalers.  He also does not use a CPAP.

## 2021-11-18 NOTE — Progress Notes (Signed)
Patient and daughter educated.  AVS explained and given to daughter.  Patient transferred off unit by wheelchair.  Patient taken by private auto via daughter.  Questions answered.

## 2021-11-18 NOTE — Progress Notes (Signed)
D/C order noted. Contacted [facility] and spoke to [place], Agricultural consultant. Clinic aware pt is covid positive and pt's treatment time will remain the same. Pt will need to enter from the back door for HD. Contacted pt's daughter via phone. Daughter states she is aware of this info. Clinic aware pt will resume on Friday.   Melven Sartorius Renal Navigator [phone]

## 2021-11-19 ENCOUNTER — Telehealth: Payer: Self-pay

## 2021-11-19 DIAGNOSIS — I341 Nonrheumatic mitral (valve) prolapse: Secondary | ICD-10-CM | POA: Diagnosis not present

## 2021-11-19 DIAGNOSIS — E1151 Type 2 diabetes mellitus with diabetic peripheral angiopathy without gangrene: Secondary | ICD-10-CM | POA: Diagnosis not present

## 2021-11-19 DIAGNOSIS — U071 COVID-19: Secondary | ICD-10-CM | POA: Diagnosis not present

## 2021-11-19 DIAGNOSIS — E1122 Type 2 diabetes mellitus with diabetic chronic kidney disease: Secondary | ICD-10-CM | POA: Diagnosis not present

## 2021-11-19 DIAGNOSIS — N186 End stage renal disease: Secondary | ICD-10-CM | POA: Diagnosis not present

## 2021-11-19 DIAGNOSIS — I2510 Atherosclerotic heart disease of native coronary artery without angina pectoris: Secondary | ICD-10-CM | POA: Diagnosis not present

## 2021-11-19 DIAGNOSIS — E039 Hypothyroidism, unspecified: Secondary | ICD-10-CM | POA: Diagnosis not present

## 2021-11-19 DIAGNOSIS — F0280 Dementia in other diseases classified elsewhere without behavioral disturbance: Secondary | ICD-10-CM | POA: Diagnosis not present

## 2021-11-19 DIAGNOSIS — I5030 Unspecified diastolic (congestive) heart failure: Secondary | ICD-10-CM | POA: Diagnosis not present

## 2021-11-19 DIAGNOSIS — G4733 Obstructive sleep apnea (adult) (pediatric): Secondary | ICD-10-CM | POA: Diagnosis not present

## 2021-11-19 DIAGNOSIS — E0500 Thyrotoxicosis with diffuse goiter without thyrotoxic crisis or storm: Secondary | ICD-10-CM | POA: Diagnosis not present

## 2021-11-19 DIAGNOSIS — H409 Unspecified glaucoma: Secondary | ICD-10-CM | POA: Diagnosis not present

## 2021-11-19 DIAGNOSIS — N3001 Acute cystitis with hematuria: Secondary | ICD-10-CM | POA: Diagnosis not present

## 2021-11-19 DIAGNOSIS — J449 Chronic obstructive pulmonary disease, unspecified: Secondary | ICD-10-CM | POA: Diagnosis not present

## 2021-11-19 DIAGNOSIS — N400 Enlarged prostate without lower urinary tract symptoms: Secondary | ICD-10-CM | POA: Diagnosis not present

## 2021-11-19 DIAGNOSIS — I132 Hypertensive heart and chronic kidney disease with heart failure and with stage 5 chronic kidney disease, or end stage renal disease: Secondary | ICD-10-CM | POA: Diagnosis not present

## 2021-11-19 DIAGNOSIS — F1011 Alcohol abuse, in remission: Secondary | ICD-10-CM | POA: Diagnosis not present

## 2021-11-19 DIAGNOSIS — I6529 Occlusion and stenosis of unspecified carotid artery: Secondary | ICD-10-CM | POA: Diagnosis not present

## 2021-11-19 DIAGNOSIS — Z87891 Personal history of nicotine dependence: Secondary | ICD-10-CM | POA: Diagnosis not present

## 2021-11-19 DIAGNOSIS — I442 Atrioventricular block, complete: Secondary | ICD-10-CM | POA: Diagnosis not present

## 2021-11-19 DIAGNOSIS — I272 Pulmonary hypertension, unspecified: Secondary | ICD-10-CM | POA: Diagnosis not present

## 2021-11-19 DIAGNOSIS — D631 Anemia in chronic kidney disease: Secondary | ICD-10-CM | POA: Diagnosis not present

## 2021-11-19 DIAGNOSIS — I340 Nonrheumatic mitral (valve) insufficiency: Secondary | ICD-10-CM | POA: Diagnosis not present

## 2021-11-19 DIAGNOSIS — Z9181 History of falling: Secondary | ICD-10-CM | POA: Diagnosis not present

## 2021-11-19 DIAGNOSIS — E785 Hyperlipidemia, unspecified: Secondary | ICD-10-CM | POA: Diagnosis not present

## 2021-11-19 LAB — HEPATITIS B SURFACE ANTIBODY, QUANTITATIVE: Hep B S AB Quant (Post): 237.6 m[IU]/mL (ref >9.9)

## 2021-11-19 NOTE — Patient Outreach (Signed)
  Care Coordination TOC Note Transition Care Management Follow-up Telephone Call Date of discharge and from where: [facility]-11[facility]18/21 How have you been since you were released from the hospital? Per patients daughter, [name], he is still weak but is doing okay". Any questions or concerns? No  Items Reviewed: Did the pt receive and understand the discharge instructions provided? Yes  Medications obtained and verified? No  Other? No  Any new allergies since your discharge? No  Dietary orders reviewed? Yes Do you have support at home? Yes   Home Care and Equipment/Supplies: Were home health services ordered? yes If so, what is the name of the agency? [facility]  Has the agency set up a time to come to the patient's home? yes Were any new equipment or medical supplies ordered?  No What is the name of the medical supply agency? N/A Were you able to get the supplies/equipment? not applicable Do you have any questions related to the use of the equipment or supplies? No  Functional Questionnaire: (I = Independent and D = Dependent) ADLs: D  Bathing/Dressing- D  Meal Prep- D  Eating- D  Maintaining continence- D  Transferring/Ambulation- D  Managing Meds- D  Follow up appointments reviewed:  PCP Hospital f/u appt confirmed? No   Specialist Hospital f/u appt confirmed? No -Patient to follow up with nephrologist at ESRD Are transportation arrangements needed? No  If their condition worsens, is the pt aware to call PCP or go to the Emergency Dept.? Yes Was the patient provided with contact information for the PCP's office or ED? Yes Was to pt encouraged to call back with questions or concerns? Yes  SDOH assessments and interventions completed:   Yes  Care Coordination Interventions Activated:  Yes   Care Coordination Interventions:   No interventions needed at this time.     Encounter Outcome:  Pt. Visit Completed

## 2021-11-20 DIAGNOSIS — U071 COVID-19: Secondary | ICD-10-CM | POA: Diagnosis not present

## 2021-11-20 DIAGNOSIS — N2581 Secondary hyperparathyroidism of renal origin: Secondary | ICD-10-CM | POA: Diagnosis not present

## 2021-11-20 DIAGNOSIS — N189 Chronic kidney disease, unspecified: Secondary | ICD-10-CM | POA: Diagnosis not present

## 2021-11-20 DIAGNOSIS — D631 Anemia in chronic kidney disease: Secondary | ICD-10-CM | POA: Diagnosis not present

## 2021-11-20 DIAGNOSIS — Z992 Dependence on renal dialysis: Secondary | ICD-10-CM | POA: Diagnosis not present

## 2021-11-20 DIAGNOSIS — N186 End stage renal disease: Secondary | ICD-10-CM | POA: Diagnosis not present

## 2021-11-23 DIAGNOSIS — N186 End stage renal disease: Secondary | ICD-10-CM | POA: Diagnosis not present

## 2021-11-23 DIAGNOSIS — Z992 Dependence on renal dialysis: Secondary | ICD-10-CM | POA: Diagnosis not present

## 2021-11-23 DIAGNOSIS — N189 Chronic kidney disease, unspecified: Secondary | ICD-10-CM | POA: Diagnosis not present

## 2021-11-23 DIAGNOSIS — N2581 Secondary hyperparathyroidism of renal origin: Secondary | ICD-10-CM | POA: Diagnosis not present

## 2021-11-23 DIAGNOSIS — D631 Anemia in chronic kidney disease: Secondary | ICD-10-CM | POA: Diagnosis not present

## 2021-11-23 DIAGNOSIS — U071 COVID-19: Secondary | ICD-10-CM | POA: Diagnosis not present

## 2021-11-24 DIAGNOSIS — N186 End stage renal disease: Secondary | ICD-10-CM | POA: Diagnosis not present

## 2021-11-24 DIAGNOSIS — E1122 Type 2 diabetes mellitus with diabetic chronic kidney disease: Secondary | ICD-10-CM | POA: Diagnosis not present

## 2021-11-24 DIAGNOSIS — I5030 Unspecified diastolic (congestive) heart failure: Secondary | ICD-10-CM | POA: Diagnosis not present

## 2021-11-24 DIAGNOSIS — I132 Hypertensive heart and chronic kidney disease with heart failure and with stage 5 chronic kidney disease, or end stage renal disease: Secondary | ICD-10-CM | POA: Diagnosis not present

## 2021-11-24 DIAGNOSIS — U071 COVID-19: Secondary | ICD-10-CM | POA: Diagnosis not present

## 2021-11-24 DIAGNOSIS — D631 Anemia in chronic kidney disease: Secondary | ICD-10-CM | POA: Diagnosis not present

## 2021-11-25 DIAGNOSIS — N186 End stage renal disease: Secondary | ICD-10-CM | POA: Diagnosis not present

## 2021-11-25 DIAGNOSIS — D631 Anemia in chronic kidney disease: Secondary | ICD-10-CM | POA: Diagnosis not present

## 2021-11-25 DIAGNOSIS — Z992 Dependence on renal dialysis: Secondary | ICD-10-CM | POA: Diagnosis not present

## 2021-11-25 DIAGNOSIS — N189 Chronic kidney disease, unspecified: Secondary | ICD-10-CM | POA: Diagnosis not present

## 2021-11-25 DIAGNOSIS — N2581 Secondary hyperparathyroidism of renal origin: Secondary | ICD-10-CM | POA: Diagnosis not present

## 2021-11-25 DIAGNOSIS — Z8616 Personal history of COVID-19: Secondary | ICD-10-CM | POA: Diagnosis not present

## 2021-11-27 DIAGNOSIS — I5030 Unspecified diastolic (congestive) heart failure: Secondary | ICD-10-CM | POA: Diagnosis not present

## 2021-11-27 DIAGNOSIS — E1122 Type 2 diabetes mellitus with diabetic chronic kidney disease: Secondary | ICD-10-CM | POA: Diagnosis not present

## 2021-11-27 DIAGNOSIS — D631 Anemia in chronic kidney disease: Secondary | ICD-10-CM | POA: Diagnosis not present

## 2021-11-27 DIAGNOSIS — N2581 Secondary hyperparathyroidism of renal origin: Secondary | ICD-10-CM | POA: Diagnosis not present

## 2021-11-27 DIAGNOSIS — I132 Hypertensive heart and chronic kidney disease with heart failure and with stage 5 chronic kidney disease, or end stage renal disease: Secondary | ICD-10-CM | POA: Diagnosis not present

## 2021-11-27 DIAGNOSIS — N186 End stage renal disease: Secondary | ICD-10-CM | POA: Diagnosis not present

## 2021-11-27 DIAGNOSIS — U071 COVID-19: Secondary | ICD-10-CM | POA: Diagnosis not present

## 2021-11-27 DIAGNOSIS — Z992 Dependence on renal dialysis: Secondary | ICD-10-CM | POA: Diagnosis not present

## 2021-11-27 DIAGNOSIS — N269 Renal sclerosis, unspecified: Secondary | ICD-10-CM | POA: Diagnosis not present

## 2021-11-28 DIAGNOSIS — N186 End stage renal disease: Secondary | ICD-10-CM | POA: Diagnosis not present

## 2021-11-28 DIAGNOSIS — D631 Anemia in chronic kidney disease: Secondary | ICD-10-CM | POA: Diagnosis not present

## 2021-11-28 DIAGNOSIS — U071 COVID-19: Secondary | ICD-10-CM | POA: Diagnosis not present

## 2021-11-28 DIAGNOSIS — I132 Hypertensive heart and chronic kidney disease with heart failure and with stage 5 chronic kidney disease, or end stage renal disease: Secondary | ICD-10-CM | POA: Diagnosis not present

## 2021-11-28 DIAGNOSIS — E1122 Type 2 diabetes mellitus with diabetic chronic kidney disease: Secondary | ICD-10-CM | POA: Diagnosis not present

## 2021-11-28 DIAGNOSIS — I5030 Unspecified diastolic (congestive) heart failure: Secondary | ICD-10-CM | POA: Diagnosis not present

## 2021-11-30 DIAGNOSIS — N186 End stage renal disease: Secondary | ICD-10-CM | POA: Diagnosis not present

## 2021-11-30 DIAGNOSIS — D631 Anemia in chronic kidney disease: Secondary | ICD-10-CM | POA: Diagnosis not present

## 2021-11-30 DIAGNOSIS — N2581 Secondary hyperparathyroidism of renal origin: Secondary | ICD-10-CM | POA: Diagnosis not present

## 2021-11-30 DIAGNOSIS — Z992 Dependence on renal dialysis: Secondary | ICD-10-CM | POA: Diagnosis not present

## 2021-12-01 DIAGNOSIS — I5030 Unspecified diastolic (congestive) heart failure: Secondary | ICD-10-CM | POA: Diagnosis not present

## 2021-12-01 DIAGNOSIS — E1122 Type 2 diabetes mellitus with diabetic chronic kidney disease: Secondary | ICD-10-CM | POA: Diagnosis not present

## 2021-12-01 DIAGNOSIS — N186 End stage renal disease: Secondary | ICD-10-CM | POA: Diagnosis not present

## 2021-12-01 DIAGNOSIS — U071 COVID-19: Secondary | ICD-10-CM | POA: Diagnosis not present

## 2021-12-01 DIAGNOSIS — D631 Anemia in chronic kidney disease: Secondary | ICD-10-CM | POA: Diagnosis not present

## 2021-12-01 DIAGNOSIS — I132 Hypertensive heart and chronic kidney disease with heart failure and with stage 5 chronic kidney disease, or end stage renal disease: Secondary | ICD-10-CM | POA: Diagnosis not present

## 2021-12-02 DIAGNOSIS — Z992 Dependence on renal dialysis: Secondary | ICD-10-CM | POA: Diagnosis not present

## 2021-12-02 DIAGNOSIS — N2581 Secondary hyperparathyroidism of renal origin: Secondary | ICD-10-CM | POA: Diagnosis not present

## 2021-12-02 DIAGNOSIS — N186 End stage renal disease: Secondary | ICD-10-CM | POA: Diagnosis not present

## 2021-12-02 DIAGNOSIS — D631 Anemia in chronic kidney disease: Secondary | ICD-10-CM | POA: Diagnosis not present

## 2021-12-04 DIAGNOSIS — D631 Anemia in chronic kidney disease: Secondary | ICD-10-CM | POA: Diagnosis not present

## 2021-12-04 DIAGNOSIS — N2581 Secondary hyperparathyroidism of renal origin: Secondary | ICD-10-CM | POA: Diagnosis not present

## 2021-12-04 DIAGNOSIS — Z992 Dependence on renal dialysis: Secondary | ICD-10-CM | POA: Diagnosis not present

## 2021-12-04 DIAGNOSIS — N186 End stage renal disease: Secondary | ICD-10-CM | POA: Diagnosis not present

## 2021-12-07 DIAGNOSIS — N2581 Secondary hyperparathyroidism of renal origin: Secondary | ICD-10-CM | POA: Diagnosis not present

## 2021-12-07 DIAGNOSIS — N186 End stage renal disease: Secondary | ICD-10-CM | POA: Diagnosis not present

## 2021-12-07 DIAGNOSIS — Z992 Dependence on renal dialysis: Secondary | ICD-10-CM | POA: Diagnosis not present

## 2021-12-07 DIAGNOSIS — D631 Anemia in chronic kidney disease: Secondary | ICD-10-CM | POA: Diagnosis not present

## 2021-12-08 DIAGNOSIS — E1122 Type 2 diabetes mellitus with diabetic chronic kidney disease: Secondary | ICD-10-CM | POA: Diagnosis not present

## 2021-12-08 DIAGNOSIS — N186 End stage renal disease: Secondary | ICD-10-CM | POA: Diagnosis not present

## 2021-12-08 DIAGNOSIS — U071 COVID-19: Secondary | ICD-10-CM | POA: Diagnosis not present

## 2021-12-08 DIAGNOSIS — I5030 Unspecified diastolic (congestive) heart failure: Secondary | ICD-10-CM | POA: Diagnosis not present

## 2021-12-08 DIAGNOSIS — D631 Anemia in chronic kidney disease: Secondary | ICD-10-CM | POA: Diagnosis not present

## 2021-12-08 DIAGNOSIS — I132 Hypertensive heart and chronic kidney disease with heart failure and with stage 5 chronic kidney disease, or end stage renal disease: Secondary | ICD-10-CM | POA: Diagnosis not present

## 2021-12-09 DIAGNOSIS — D631 Anemia in chronic kidney disease: Secondary | ICD-10-CM | POA: Diagnosis not present

## 2021-12-09 DIAGNOSIS — N2581 Secondary hyperparathyroidism of renal origin: Secondary | ICD-10-CM | POA: Diagnosis not present

## 2021-12-09 DIAGNOSIS — Z992 Dependence on renal dialysis: Secondary | ICD-10-CM | POA: Diagnosis not present

## 2021-12-09 DIAGNOSIS — N186 End stage renal disease: Secondary | ICD-10-CM | POA: Diagnosis not present

## 2021-12-11 DIAGNOSIS — D631 Anemia in chronic kidney disease: Secondary | ICD-10-CM | POA: Diagnosis not present

## 2021-12-11 DIAGNOSIS — Z992 Dependence on renal dialysis: Secondary | ICD-10-CM | POA: Diagnosis not present

## 2021-12-11 DIAGNOSIS — N186 End stage renal disease: Secondary | ICD-10-CM | POA: Diagnosis not present

## 2021-12-11 DIAGNOSIS — N2581 Secondary hyperparathyroidism of renal origin: Secondary | ICD-10-CM | POA: Diagnosis not present

## 2021-12-14 DIAGNOSIS — N186 End stage renal disease: Secondary | ICD-10-CM | POA: Diagnosis not present

## 2021-12-14 DIAGNOSIS — D631 Anemia in chronic kidney disease: Secondary | ICD-10-CM | POA: Diagnosis not present

## 2021-12-14 DIAGNOSIS — N2581 Secondary hyperparathyroidism of renal origin: Secondary | ICD-10-CM | POA: Diagnosis not present

## 2021-12-14 DIAGNOSIS — Z992 Dependence on renal dialysis: Secondary | ICD-10-CM | POA: Diagnosis not present

## 2021-12-15 DIAGNOSIS — E1122 Type 2 diabetes mellitus with diabetic chronic kidney disease: Secondary | ICD-10-CM | POA: Diagnosis not present

## 2021-12-15 DIAGNOSIS — D631 Anemia in chronic kidney disease: Secondary | ICD-10-CM | POA: Diagnosis not present

## 2021-12-15 DIAGNOSIS — U071 COVID-19: Secondary | ICD-10-CM | POA: Diagnosis not present

## 2021-12-15 DIAGNOSIS — N186 End stage renal disease: Secondary | ICD-10-CM | POA: Diagnosis not present

## 2021-12-15 DIAGNOSIS — I5030 Unspecified diastolic (congestive) heart failure: Secondary | ICD-10-CM | POA: Diagnosis not present

## 2021-12-15 DIAGNOSIS — I132 Hypertensive heart and chronic kidney disease with heart failure and with stage 5 chronic kidney disease, or end stage renal disease: Secondary | ICD-10-CM | POA: Diagnosis not present

## 2021-12-16 DIAGNOSIS — N2581 Secondary hyperparathyroidism of renal origin: Secondary | ICD-10-CM | POA: Diagnosis not present

## 2021-12-16 DIAGNOSIS — Z992 Dependence on renal dialysis: Secondary | ICD-10-CM | POA: Diagnosis not present

## 2021-12-16 DIAGNOSIS — N186 End stage renal disease: Secondary | ICD-10-CM | POA: Diagnosis not present

## 2021-12-16 DIAGNOSIS — D631 Anemia in chronic kidney disease: Secondary | ICD-10-CM | POA: Diagnosis not present

## 2021-12-18 DIAGNOSIS — D631 Anemia in chronic kidney disease: Secondary | ICD-10-CM | POA: Diagnosis not present

## 2021-12-18 DIAGNOSIS — N186 End stage renal disease: Secondary | ICD-10-CM | POA: Diagnosis not present

## 2021-12-18 DIAGNOSIS — N2581 Secondary hyperparathyroidism of renal origin: Secondary | ICD-10-CM | POA: Diagnosis not present

## 2021-12-18 DIAGNOSIS — Z992 Dependence on renal dialysis: Secondary | ICD-10-CM | POA: Diagnosis not present

## 2021-12-19 DIAGNOSIS — I6529 Occlusion and stenosis of unspecified carotid artery: Secondary | ICD-10-CM | POA: Diagnosis not present

## 2021-12-19 DIAGNOSIS — U071 COVID-19: Secondary | ICD-10-CM | POA: Diagnosis not present

## 2021-12-19 DIAGNOSIS — E039 Hypothyroidism, unspecified: Secondary | ICD-10-CM | POA: Diagnosis not present

## 2021-12-19 DIAGNOSIS — Z87891 Personal history of nicotine dependence: Secondary | ICD-10-CM | POA: Diagnosis not present

## 2021-12-19 DIAGNOSIS — D631 Anemia in chronic kidney disease: Secondary | ICD-10-CM | POA: Diagnosis not present

## 2021-12-19 DIAGNOSIS — E1151 Type 2 diabetes mellitus with diabetic peripheral angiopathy without gangrene: Secondary | ICD-10-CM | POA: Diagnosis not present

## 2021-12-19 DIAGNOSIS — J449 Chronic obstructive pulmonary disease, unspecified: Secondary | ICD-10-CM | POA: Diagnosis not present

## 2021-12-19 DIAGNOSIS — I341 Nonrheumatic mitral (valve) prolapse: Secondary | ICD-10-CM | POA: Diagnosis not present

## 2021-12-19 DIAGNOSIS — E785 Hyperlipidemia, unspecified: Secondary | ICD-10-CM | POA: Diagnosis not present

## 2021-12-19 DIAGNOSIS — I2510 Atherosclerotic heart disease of native coronary artery without angina pectoris: Secondary | ICD-10-CM | POA: Diagnosis not present

## 2021-12-19 DIAGNOSIS — I442 Atrioventricular block, complete: Secondary | ICD-10-CM | POA: Diagnosis not present

## 2021-12-19 DIAGNOSIS — E0500 Thyrotoxicosis with diffuse goiter without thyrotoxic crisis or storm: Secondary | ICD-10-CM | POA: Diagnosis not present

## 2021-12-19 DIAGNOSIS — I5030 Unspecified diastolic (congestive) heart failure: Secondary | ICD-10-CM | POA: Diagnosis not present

## 2021-12-19 DIAGNOSIS — Z9181 History of falling: Secondary | ICD-10-CM | POA: Diagnosis not present

## 2021-12-19 DIAGNOSIS — H409 Unspecified glaucoma: Secondary | ICD-10-CM | POA: Diagnosis not present

## 2021-12-19 DIAGNOSIS — I132 Hypertensive heart and chronic kidney disease with heart failure and with stage 5 chronic kidney disease, or end stage renal disease: Secondary | ICD-10-CM | POA: Diagnosis not present

## 2021-12-19 DIAGNOSIS — F1011 Alcohol abuse, in remission: Secondary | ICD-10-CM | POA: Diagnosis not present

## 2021-12-19 DIAGNOSIS — F0280 Dementia in other diseases classified elsewhere without behavioral disturbance: Secondary | ICD-10-CM | POA: Diagnosis not present

## 2021-12-19 DIAGNOSIS — E1122 Type 2 diabetes mellitus with diabetic chronic kidney disease: Secondary | ICD-10-CM | POA: Diagnosis not present

## 2021-12-19 DIAGNOSIS — I272 Pulmonary hypertension, unspecified: Secondary | ICD-10-CM | POA: Diagnosis not present

## 2021-12-19 DIAGNOSIS — N400 Enlarged prostate without lower urinary tract symptoms: Secondary | ICD-10-CM | POA: Diagnosis not present

## 2021-12-19 DIAGNOSIS — N3001 Acute cystitis with hematuria: Secondary | ICD-10-CM | POA: Diagnosis not present

## 2021-12-19 DIAGNOSIS — I340 Nonrheumatic mitral (valve) insufficiency: Secondary | ICD-10-CM | POA: Diagnosis not present

## 2021-12-19 DIAGNOSIS — G4733 Obstructive sleep apnea (adult) (pediatric): Secondary | ICD-10-CM | POA: Diagnosis not present

## 2021-12-19 DIAGNOSIS — N186 End stage renal disease: Secondary | ICD-10-CM | POA: Diagnosis not present

## 2021-12-21 DIAGNOSIS — Z992 Dependence on renal dialysis: Secondary | ICD-10-CM | POA: Diagnosis not present

## 2021-12-21 DIAGNOSIS — N186 End stage renal disease: Secondary | ICD-10-CM | POA: Diagnosis not present

## 2021-12-21 DIAGNOSIS — N2581 Secondary hyperparathyroidism of renal origin: Secondary | ICD-10-CM | POA: Diagnosis not present

## 2021-12-21 DIAGNOSIS — D631 Anemia in chronic kidney disease: Secondary | ICD-10-CM | POA: Diagnosis not present

## 2021-12-22 DIAGNOSIS — E1122 Type 2 diabetes mellitus with diabetic chronic kidney disease: Secondary | ICD-10-CM | POA: Diagnosis not present

## 2021-12-22 DIAGNOSIS — U071 COVID-19: Secondary | ICD-10-CM | POA: Diagnosis not present

## 2021-12-22 DIAGNOSIS — D631 Anemia in chronic kidney disease: Secondary | ICD-10-CM | POA: Diagnosis not present

## 2021-12-22 DIAGNOSIS — I5030 Unspecified diastolic (congestive) heart failure: Secondary | ICD-10-CM | POA: Diagnosis not present

## 2021-12-22 DIAGNOSIS — I132 Hypertensive heart and chronic kidney disease with heart failure and with stage 5 chronic kidney disease, or end stage renal disease: Secondary | ICD-10-CM | POA: Diagnosis not present

## 2021-12-22 DIAGNOSIS — N186 End stage renal disease: Secondary | ICD-10-CM | POA: Diagnosis not present

## 2021-12-23 DIAGNOSIS — D631 Anemia in chronic kidney disease: Secondary | ICD-10-CM | POA: Diagnosis not present

## 2021-12-23 DIAGNOSIS — N186 End stage renal disease: Secondary | ICD-10-CM | POA: Diagnosis not present

## 2021-12-23 DIAGNOSIS — Z992 Dependence on renal dialysis: Secondary | ICD-10-CM | POA: Diagnosis not present

## 2021-12-23 DIAGNOSIS — N2581 Secondary hyperparathyroidism of renal origin: Secondary | ICD-10-CM | POA: Diagnosis not present

## 2021-12-25 DIAGNOSIS — N186 End stage renal disease: Secondary | ICD-10-CM | POA: Diagnosis not present

## 2021-12-25 DIAGNOSIS — Z992 Dependence on renal dialysis: Secondary | ICD-10-CM | POA: Diagnosis not present

## 2021-12-25 DIAGNOSIS — N2581 Secondary hyperparathyroidism of renal origin: Secondary | ICD-10-CM | POA: Diagnosis not present

## 2021-12-25 DIAGNOSIS — D631 Anemia in chronic kidney disease: Secondary | ICD-10-CM | POA: Diagnosis not present

## 2021-12-27 DIAGNOSIS — Z992 Dependence on renal dialysis: Secondary | ICD-10-CM | POA: Diagnosis not present

## 2021-12-27 DIAGNOSIS — N269 Renal sclerosis, unspecified: Secondary | ICD-10-CM | POA: Diagnosis not present

## 2021-12-27 DIAGNOSIS — N186 End stage renal disease: Secondary | ICD-10-CM | POA: Diagnosis not present

## 2021-12-28 DIAGNOSIS — D631 Anemia in chronic kidney disease: Secondary | ICD-10-CM | POA: Diagnosis not present

## 2021-12-28 DIAGNOSIS — N2581 Secondary hyperparathyroidism of renal origin: Secondary | ICD-10-CM | POA: Diagnosis not present

## 2021-12-28 DIAGNOSIS — N186 End stage renal disease: Secondary | ICD-10-CM | POA: Diagnosis not present

## 2021-12-28 DIAGNOSIS — Z992 Dependence on renal dialysis: Secondary | ICD-10-CM | POA: Diagnosis not present

## 2021-12-29 DIAGNOSIS — N186 End stage renal disease: Secondary | ICD-10-CM | POA: Diagnosis not present

## 2021-12-29 DIAGNOSIS — I132 Hypertensive heart and chronic kidney disease with heart failure and with stage 5 chronic kidney disease, or end stage renal disease: Secondary | ICD-10-CM | POA: Diagnosis not present

## 2021-12-29 DIAGNOSIS — D631 Anemia in chronic kidney disease: Secondary | ICD-10-CM | POA: Diagnosis not present

## 2021-12-29 DIAGNOSIS — U071 COVID-19: Secondary | ICD-10-CM | POA: Diagnosis not present

## 2021-12-29 DIAGNOSIS — E1122 Type 2 diabetes mellitus with diabetic chronic kidney disease: Secondary | ICD-10-CM | POA: Diagnosis not present

## 2021-12-29 DIAGNOSIS — I5030 Unspecified diastolic (congestive) heart failure: Secondary | ICD-10-CM | POA: Diagnosis not present

## 2021-12-30 DIAGNOSIS — N186 End stage renal disease: Secondary | ICD-10-CM | POA: Diagnosis not present

## 2021-12-30 DIAGNOSIS — N2581 Secondary hyperparathyroidism of renal origin: Secondary | ICD-10-CM | POA: Diagnosis not present

## 2021-12-30 DIAGNOSIS — D631 Anemia in chronic kidney disease: Secondary | ICD-10-CM | POA: Diagnosis not present

## 2021-12-30 DIAGNOSIS — Z992 Dependence on renal dialysis: Secondary | ICD-10-CM | POA: Diagnosis not present

## 2022-01-01 DIAGNOSIS — N2581 Secondary hyperparathyroidism of renal origin: Secondary | ICD-10-CM | POA: Diagnosis not present

## 2022-01-01 DIAGNOSIS — N186 End stage renal disease: Secondary | ICD-10-CM | POA: Diagnosis not present

## 2022-01-01 DIAGNOSIS — D631 Anemia in chronic kidney disease: Secondary | ICD-10-CM | POA: Diagnosis not present

## 2022-01-01 DIAGNOSIS — Z992 Dependence on renal dialysis: Secondary | ICD-10-CM | POA: Diagnosis not present

## 2022-01-04 DIAGNOSIS — Z992 Dependence on renal dialysis: Secondary | ICD-10-CM | POA: Diagnosis not present

## 2022-01-04 DIAGNOSIS — D631 Anemia in chronic kidney disease: Secondary | ICD-10-CM | POA: Diagnosis not present

## 2022-01-04 DIAGNOSIS — N186 End stage renal disease: Secondary | ICD-10-CM | POA: Diagnosis not present

## 2022-01-04 DIAGNOSIS — N2581 Secondary hyperparathyroidism of renal origin: Secondary | ICD-10-CM | POA: Diagnosis not present

## 2022-01-05 DIAGNOSIS — E1122 Type 2 diabetes mellitus with diabetic chronic kidney disease: Secondary | ICD-10-CM | POA: Diagnosis not present

## 2022-01-05 DIAGNOSIS — D631 Anemia in chronic kidney disease: Secondary | ICD-10-CM | POA: Diagnosis not present

## 2022-01-05 DIAGNOSIS — N186 End stage renal disease: Secondary | ICD-10-CM | POA: Diagnosis not present

## 2022-01-05 DIAGNOSIS — U071 COVID-19: Secondary | ICD-10-CM | POA: Diagnosis not present

## 2022-01-05 DIAGNOSIS — I5030 Unspecified diastolic (congestive) heart failure: Secondary | ICD-10-CM | POA: Diagnosis not present

## 2022-01-05 DIAGNOSIS — I132 Hypertensive heart and chronic kidney disease with heart failure and with stage 5 chronic kidney disease, or end stage renal disease: Secondary | ICD-10-CM | POA: Diagnosis not present

## 2022-01-06 DIAGNOSIS — N186 End stage renal disease: Secondary | ICD-10-CM | POA: Diagnosis not present

## 2022-01-06 DIAGNOSIS — Z992 Dependence on renal dialysis: Secondary | ICD-10-CM | POA: Diagnosis not present

## 2022-01-06 DIAGNOSIS — N2581 Secondary hyperparathyroidism of renal origin: Secondary | ICD-10-CM | POA: Diagnosis not present

## 2022-01-06 DIAGNOSIS — D631 Anemia in chronic kidney disease: Secondary | ICD-10-CM | POA: Diagnosis not present

## 2022-01-08 DIAGNOSIS — N2581 Secondary hyperparathyroidism of renal origin: Secondary | ICD-10-CM | POA: Diagnosis not present

## 2022-01-08 DIAGNOSIS — D631 Anemia in chronic kidney disease: Secondary | ICD-10-CM | POA: Diagnosis not present

## 2022-01-08 DIAGNOSIS — N186 End stage renal disease: Secondary | ICD-10-CM | POA: Diagnosis not present

## 2022-01-08 DIAGNOSIS — Z992 Dependence on renal dialysis: Secondary | ICD-10-CM | POA: Diagnosis not present

## 2022-01-11 DIAGNOSIS — N186 End stage renal disease: Secondary | ICD-10-CM | POA: Diagnosis not present

## 2022-01-11 DIAGNOSIS — D631 Anemia in chronic kidney disease: Secondary | ICD-10-CM | POA: Diagnosis not present

## 2022-01-11 DIAGNOSIS — N2581 Secondary hyperparathyroidism of renal origin: Secondary | ICD-10-CM | POA: Diagnosis not present

## 2022-01-11 DIAGNOSIS — Z992 Dependence on renal dialysis: Secondary | ICD-10-CM | POA: Diagnosis not present

## 2022-01-12 ENCOUNTER — Inpatient Hospital Stay (HOSPITAL_COMMUNITY): Payer: Medicare Other

## 2022-01-12 ENCOUNTER — Emergency Department (HOSPITAL_COMMUNITY): Payer: Medicare Other

## 2022-01-12 ENCOUNTER — Inpatient Hospital Stay (HOSPITAL_COMMUNITY)
Admission: EM | Admit: 2022-01-12 | Discharge: 2022-02-26 | DRG: 870 | Disposition: E | Payer: Medicare Other | Attending: Internal Medicine | Admitting: Internal Medicine

## 2022-01-12 ENCOUNTER — Encounter (HOSPITAL_COMMUNITY): Payer: Self-pay | Admitting: Radiology

## 2022-01-12 DIAGNOSIS — N186 End stage renal disease: Secondary | ICD-10-CM | POA: Diagnosis present

## 2022-01-12 DIAGNOSIS — G9340 Encephalopathy, unspecified: Secondary | ICD-10-CM | POA: Diagnosis not present

## 2022-01-12 DIAGNOSIS — G9341 Metabolic encephalopathy: Secondary | ICD-10-CM | POA: Diagnosis present

## 2022-01-12 DIAGNOSIS — N2581 Secondary hyperparathyroidism of renal origin: Secondary | ICD-10-CM | POA: Diagnosis not present

## 2022-01-12 DIAGNOSIS — E875 Hyperkalemia: Secondary | ICD-10-CM | POA: Diagnosis not present

## 2022-01-12 DIAGNOSIS — A403 Sepsis due to Streptococcus pneumoniae: Secondary | ICD-10-CM | POA: Diagnosis not present

## 2022-01-12 DIAGNOSIS — I4819 Other persistent atrial fibrillation: Secondary | ICD-10-CM | POA: Diagnosis not present

## 2022-01-12 DIAGNOSIS — J984 Other disorders of lung: Secondary | ICD-10-CM | POA: Diagnosis not present

## 2022-01-12 DIAGNOSIS — R0902 Hypoxemia: Secondary | ICD-10-CM | POA: Diagnosis not present

## 2022-01-12 DIAGNOSIS — R9431 Abnormal electrocardiogram [ECG] [EKG]: Secondary | ICD-10-CM | POA: Diagnosis not present

## 2022-01-12 DIAGNOSIS — J439 Emphysema, unspecified: Secondary | ICD-10-CM | POA: Diagnosis not present

## 2022-01-12 DIAGNOSIS — Z841 Family history of disorders of kidney and ureter: Secondary | ICD-10-CM

## 2022-01-12 DIAGNOSIS — R410 Disorientation, unspecified: Secondary | ICD-10-CM | POA: Diagnosis not present

## 2022-01-12 DIAGNOSIS — Z66 Do not resuscitate: Secondary | ICD-10-CM | POA: Diagnosis not present

## 2022-01-12 DIAGNOSIS — R0689 Other abnormalities of breathing: Secondary | ICD-10-CM | POA: Diagnosis not present

## 2022-01-12 DIAGNOSIS — I6523 Occlusion and stenosis of bilateral carotid arteries: Secondary | ICD-10-CM | POA: Diagnosis not present

## 2022-01-12 DIAGNOSIS — J9811 Atelectasis: Secondary | ICD-10-CM | POA: Diagnosis not present

## 2022-01-12 DIAGNOSIS — I2510 Atherosclerotic heart disease of native coronary artery without angina pectoris: Secondary | ICD-10-CM | POA: Diagnosis present

## 2022-01-12 DIAGNOSIS — Z9181 History of falling: Secondary | ICD-10-CM

## 2022-01-12 DIAGNOSIS — R579 Shock, unspecified: Secondary | ICD-10-CM

## 2022-01-12 DIAGNOSIS — E876 Hypokalemia: Secondary | ICD-10-CM | POA: Diagnosis present

## 2022-01-12 DIAGNOSIS — I132 Hypertensive heart and chronic kidney disease with heart failure and with stage 5 chronic kidney disease, or end stage renal disease: Secondary | ICD-10-CM | POA: Diagnosis present

## 2022-01-12 DIAGNOSIS — N400 Enlarged prostate without lower urinary tract symptoms: Secondary | ICD-10-CM | POA: Diagnosis present

## 2022-01-12 DIAGNOSIS — I10 Essential (primary) hypertension: Secondary | ICD-10-CM | POA: Diagnosis not present

## 2022-01-12 DIAGNOSIS — N179 Acute kidney failure, unspecified: Secondary | ICD-10-CM | POA: Diagnosis present

## 2022-01-12 DIAGNOSIS — I442 Atrioventricular block, complete: Secondary | ICD-10-CM | POA: Diagnosis present

## 2022-01-12 DIAGNOSIS — A409 Streptococcal sepsis, unspecified: Secondary | ICD-10-CM | POA: Diagnosis not present

## 2022-01-12 DIAGNOSIS — A419 Sepsis, unspecified organism: Secondary | ICD-10-CM | POA: Diagnosis not present

## 2022-01-12 DIAGNOSIS — K219 Gastro-esophageal reflux disease without esophagitis: Secondary | ICD-10-CM | POA: Diagnosis present

## 2022-01-12 DIAGNOSIS — I272 Pulmonary hypertension, unspecified: Secondary | ICD-10-CM | POA: Diagnosis present

## 2022-01-12 DIAGNOSIS — R6521 Severe sepsis with septic shock: Secondary | ICD-10-CM | POA: Diagnosis not present

## 2022-01-12 DIAGNOSIS — Z79890 Hormone replacement therapy (postmenopausal): Secondary | ICD-10-CM

## 2022-01-12 DIAGNOSIS — I469 Cardiac arrest, cause unspecified: Secondary | ICD-10-CM

## 2022-01-12 DIAGNOSIS — I639 Cerebral infarction, unspecified: Secondary | ICD-10-CM | POA: Diagnosis present

## 2022-01-12 DIAGNOSIS — J690 Pneumonitis due to inhalation of food and vomit: Secondary | ICD-10-CM | POA: Diagnosis not present

## 2022-01-12 DIAGNOSIS — J9601 Acute respiratory failure with hypoxia: Secondary | ICD-10-CM | POA: Diagnosis not present

## 2022-01-12 DIAGNOSIS — Z8616 Personal history of COVID-19: Secondary | ICD-10-CM | POA: Diagnosis not present

## 2022-01-12 DIAGNOSIS — I5021 Acute systolic (congestive) heart failure: Secondary | ICD-10-CM | POA: Diagnosis not present

## 2022-01-12 DIAGNOSIS — J13 Pneumonia due to Streptococcus pneumoniae: Secondary | ICD-10-CM | POA: Diagnosis present

## 2022-01-12 DIAGNOSIS — Z79899 Other long term (current) drug therapy: Secondary | ICD-10-CM

## 2022-01-12 DIAGNOSIS — E1122 Type 2 diabetes mellitus with diabetic chronic kidney disease: Secondary | ICD-10-CM | POA: Diagnosis not present

## 2022-01-12 DIAGNOSIS — Z515 Encounter for palliative care: Secondary | ICD-10-CM | POA: Diagnosis not present

## 2022-01-12 DIAGNOSIS — Z91199 Patient's noncompliance with other medical treatment and regimen due to unspecified reason: Secondary | ICD-10-CM

## 2022-01-12 DIAGNOSIS — W44F9XA Other object of natural or organic material, entering into or through a natural orifice, initial encounter: Secondary | ICD-10-CM | POA: Diagnosis not present

## 2022-01-12 DIAGNOSIS — J440 Chronic obstructive pulmonary disease with acute lower respiratory infection: Secondary | ICD-10-CM | POA: Diagnosis present

## 2022-01-12 DIAGNOSIS — I6501 Occlusion and stenosis of right vertebral artery: Secondary | ICD-10-CM | POA: Diagnosis not present

## 2022-01-12 DIAGNOSIS — I700 Atherosclerosis of aorta: Secondary | ICD-10-CM | POA: Diagnosis not present

## 2022-01-12 DIAGNOSIS — I517 Cardiomegaly: Secondary | ICD-10-CM | POA: Diagnosis not present

## 2022-01-12 DIAGNOSIS — D631 Anemia in chronic kidney disease: Secondary | ICD-10-CM | POA: Diagnosis not present

## 2022-01-12 DIAGNOSIS — Z87891 Personal history of nicotine dependence: Secondary | ICD-10-CM

## 2022-01-12 DIAGNOSIS — L89151 Pressure ulcer of sacral region, stage 1: Secondary | ICD-10-CM | POA: Diagnosis present

## 2022-01-12 DIAGNOSIS — D696 Thrombocytopenia, unspecified: Secondary | ICD-10-CM | POA: Diagnosis present

## 2022-01-12 DIAGNOSIS — E0500 Thyrotoxicosis with diffuse goiter without thyrotoxic crisis or storm: Secondary | ICD-10-CM | POA: Diagnosis present

## 2022-01-12 DIAGNOSIS — I63442 Cerebral infarction due to embolism of left cerebellar artery: Secondary | ICD-10-CM | POA: Diagnosis not present

## 2022-01-12 DIAGNOSIS — Z7901 Long term (current) use of anticoagulants: Secondary | ICD-10-CM

## 2022-01-12 DIAGNOSIS — I120 Hypertensive chronic kidney disease with stage 5 chronic kidney disease or end stage renal disease: Secondary | ICD-10-CM | POA: Diagnosis not present

## 2022-01-12 DIAGNOSIS — R7989 Other specified abnormal findings of blood chemistry: Secondary | ICD-10-CM | POA: Diagnosis not present

## 2022-01-12 DIAGNOSIS — E43 Unspecified severe protein-calorie malnutrition: Secondary | ICD-10-CM | POA: Diagnosis not present

## 2022-01-12 DIAGNOSIS — F0150 Vascular dementia without behavioral disturbance: Secondary | ICD-10-CM | POA: Diagnosis present

## 2022-01-12 DIAGNOSIS — E039 Hypothyroidism, unspecified: Secondary | ICD-10-CM | POA: Diagnosis present

## 2022-01-12 DIAGNOSIS — J189 Pneumonia, unspecified organism: Secondary | ICD-10-CM | POA: Diagnosis not present

## 2022-01-12 DIAGNOSIS — I214 Non-ST elevation (NSTEMI) myocardial infarction: Secondary | ICD-10-CM | POA: Diagnosis present

## 2022-01-12 DIAGNOSIS — J9690 Respiratory failure, unspecified, unspecified whether with hypoxia or hypercapnia: Secondary | ICD-10-CM | POA: Diagnosis not present

## 2022-01-12 DIAGNOSIS — Z681 Body mass index (BMI) 19 or less, adult: Secondary | ICD-10-CM

## 2022-01-12 DIAGNOSIS — E8779 Other fluid overload: Secondary | ICD-10-CM | POA: Diagnosis not present

## 2022-01-12 DIAGNOSIS — R918 Other nonspecific abnormal finding of lung field: Secondary | ICD-10-CM | POA: Diagnosis not present

## 2022-01-12 DIAGNOSIS — R0609 Other forms of dyspnea: Secondary | ICD-10-CM | POA: Diagnosis not present

## 2022-01-12 DIAGNOSIS — Z8249 Family history of ischemic heart disease and other diseases of the circulatory system: Secondary | ICD-10-CM

## 2022-01-12 DIAGNOSIS — J90 Pleural effusion, not elsewhere classified: Secondary | ICD-10-CM | POA: Diagnosis not present

## 2022-01-12 DIAGNOSIS — R296 Repeated falls: Secondary | ICD-10-CM | POA: Diagnosis present

## 2022-01-12 DIAGNOSIS — G9349 Other encephalopathy: Secondary | ICD-10-CM | POA: Diagnosis present

## 2022-01-12 DIAGNOSIS — H409 Unspecified glaucoma: Secondary | ICD-10-CM | POA: Diagnosis present

## 2022-01-12 DIAGNOSIS — I959 Hypotension, unspecified: Secondary | ICD-10-CM | POA: Diagnosis not present

## 2022-01-12 DIAGNOSIS — R4182 Altered mental status, unspecified: Secondary | ICD-10-CM | POA: Diagnosis not present

## 2022-01-12 DIAGNOSIS — E1165 Type 2 diabetes mellitus with hyperglycemia: Secondary | ICD-10-CM | POA: Diagnosis not present

## 2022-01-12 DIAGNOSIS — G4733 Obstructive sleep apnea (adult) (pediatric): Secondary | ICD-10-CM | POA: Diagnosis present

## 2022-01-12 DIAGNOSIS — R55 Syncope and collapse: Secondary | ICD-10-CM | POA: Diagnosis not present

## 2022-01-12 DIAGNOSIS — R188 Other ascites: Secondary | ICD-10-CM | POA: Diagnosis not present

## 2022-01-12 DIAGNOSIS — Z8711 Personal history of peptic ulcer disease: Secondary | ICD-10-CM

## 2022-01-12 DIAGNOSIS — R531 Weakness: Secondary | ICD-10-CM | POA: Diagnosis not present

## 2022-01-12 DIAGNOSIS — J988 Other specified respiratory disorders: Secondary | ICD-10-CM | POA: Diagnosis not present

## 2022-01-12 DIAGNOSIS — Z950 Presence of cardiac pacemaker: Secondary | ICD-10-CM

## 2022-01-12 DIAGNOSIS — I7122 Aneurysm of the aortic arch, without rupture: Secondary | ICD-10-CM | POA: Diagnosis present

## 2022-01-12 DIAGNOSIS — Z888 Allergy status to other drugs, medicaments and biological substances status: Secondary | ICD-10-CM

## 2022-01-12 DIAGNOSIS — T17990A Other foreign object in respiratory tract, part unspecified in causing asphyxiation, initial encounter: Secondary | ICD-10-CM | POA: Diagnosis not present

## 2022-01-12 DIAGNOSIS — Z992 Dependence on renal dialysis: Secondary | ICD-10-CM

## 2022-01-12 DIAGNOSIS — K4090 Unilateral inguinal hernia, without obstruction or gangrene, not specified as recurrent: Secondary | ICD-10-CM | POA: Diagnosis not present

## 2022-01-12 DIAGNOSIS — E1151 Type 2 diabetes mellitus with diabetic peripheral angiopathy without gangrene: Secondary | ICD-10-CM | POA: Diagnosis present

## 2022-01-12 DIAGNOSIS — I468 Cardiac arrest due to other underlying condition: Secondary | ICD-10-CM | POA: Diagnosis not present

## 2022-01-12 DIAGNOSIS — E785 Hyperlipidemia, unspecified: Secondary | ICD-10-CM | POA: Diagnosis present

## 2022-01-12 DIAGNOSIS — I719 Aortic aneurysm of unspecified site, without rupture: Secondary | ICD-10-CM | POA: Diagnosis not present

## 2022-01-12 DIAGNOSIS — Z4682 Encounter for fitting and adjustment of non-vascular catheter: Secondary | ICD-10-CM | POA: Diagnosis not present

## 2022-01-12 LAB — RESPIRATORY PANEL BY PCR

## 2022-01-12 LAB — CBC WITH DIFFERENTIAL/PLATELET
Abs Immature Granulocytes: 0.56 10*3/uL — ABNORMAL HIGH (ref 0.00–0.07)
Basophils Absolute: 0 10*3/uL (ref 0.0–0.1)
Basophils Relative: 0 %
Eosinophils Absolute: 0 10*3/uL (ref 0.0–0.5)
Eosinophils Relative: 0 %
HCT: 30.5 % — ABNORMAL LOW (ref 39.0–52.0)
Hemoglobin: 9.6 g/dL — ABNORMAL LOW (ref 13.0–17.0)
Immature Granulocytes: 8 %
Lymphocytes Relative: 8 %
Lymphs Abs: 0.6 10*3/uL — ABNORMAL LOW (ref 0.7–4.0)
MCH: 32.7 pg (ref 26.0–34.0)
MCHC: 31.5 g/dL (ref 30.0–36.0)
MCV: 103.7 fL — ABNORMAL HIGH (ref 80.0–100.0)
Monocytes Absolute: 0.2 10*3/uL (ref 0.1–1.0)
Monocytes Relative: 3 %
Neutro Abs: 5.6 10*3/uL (ref 1.7–7.7)
Neutrophils Relative %: 81 %
Platelets: 94 10*3/uL — ABNORMAL LOW (ref 150–400)
RBC: 2.94 MIL/uL — ABNORMAL LOW (ref 4.22–5.81)
RDW: 14.9 % (ref 11.5–15.5)
WBC: 7 10*3/uL (ref 4.0–10.5)
nRBC: 0 % (ref 0.0–0.2)

## 2022-01-12 LAB — ECHOCARDIOGRAM COMPLETE
Area-P 1/2: 3.37 cm2
Calc EF: 46.3 %
Height: 69 in
S' Lateral: 2.6 cm
Single Plane A2C EF: 48.5 %
Single Plane A4C EF: 41.2 %
Weight: 1971.79 oz

## 2022-01-12 LAB — I-STAT CHEM 8, ED
BUN: 29 mg/dL — ABNORMAL HIGH (ref 8–23)
Calcium, Ion: 0.98 mmol/L — ABNORMAL LOW (ref 1.15–1.40)
Chloride: 96 mmol/L — ABNORMAL LOW (ref 98–111)
Creatinine, Ser: 4.8 mg/dL — ABNORMAL HIGH (ref 0.61–1.24)
Glucose, Bld: 68 mg/dL — ABNORMAL LOW (ref 70–99)
HCT: 28 % — ABNORMAL LOW (ref 39.0–52.0)
Hemoglobin: 9.5 g/dL — ABNORMAL LOW (ref 13.0–17.0)
Potassium: 3.3 mmol/L — ABNORMAL LOW (ref 3.5–5.1)
Sodium: 138 mmol/L (ref 135–145)
TCO2: 26 mmol/L (ref 22–32)

## 2022-01-12 LAB — I-STAT ARTERIAL BLOOD GAS, ED
Acid-Base Excess: 5 mmol/L — ABNORMAL HIGH (ref 0.0–2.0)
Bicarbonate: 29.4 mmol/L — ABNORMAL HIGH (ref 20.0–28.0)
Calcium, Ion: 1.13 mmol/L — ABNORMAL LOW (ref 1.15–1.40)
HCT: 29 % — ABNORMAL LOW (ref 39.0–52.0)
Hemoglobin: 9.9 g/dL — ABNORMAL LOW (ref 13.0–17.0)
O2 Saturation: 100 %
Patient temperature: 101.3
Potassium: 3.3 mmol/L — ABNORMAL LOW (ref 3.5–5.1)
Sodium: 139 mmol/L (ref 135–145)
TCO2: 31 mmol/L (ref 22–32)
pCO2 arterial: 43.4 mmHg (ref 32–48)
pH, Arterial: 7.445 (ref 7.35–7.45)
pO2, Arterial: 240 mmHg — ABNORMAL HIGH (ref 83–108)

## 2022-01-12 LAB — I-STAT VENOUS BLOOD GAS, ED
Acid-Base Excess: 6 mmol/L — ABNORMAL HIGH (ref 0.0–2.0)
Bicarbonate: 29.6 mmol/L — ABNORMAL HIGH (ref 20.0–28.0)
Calcium, Ion: 1.01 mmol/L — ABNORMAL LOW (ref 1.15–1.40)
HCT: 29 % — ABNORMAL LOW (ref 39.0–52.0)
Hemoglobin: 9.9 g/dL — ABNORMAL LOW (ref 13.0–17.0)
O2 Saturation: 87 %
Potassium: 3.3 mmol/L — ABNORMAL LOW (ref 3.5–5.1)
Sodium: 138 mmol/L (ref 135–145)
TCO2: 31 mmol/L (ref 22–32)
pCO2, Ven: 36.1 mmHg — ABNORMAL LOW (ref 44–60)
pH, Ven: 7.521 — ABNORMAL HIGH (ref 7.25–7.43)
pO2, Ven: 47 mmHg — ABNORMAL HIGH (ref 32–45)

## 2022-01-12 LAB — COMPREHENSIVE METABOLIC PANEL
ALT: 33 U/L (ref 0–44)
AST: 59 U/L — ABNORMAL HIGH (ref 15–41)
Albumin: 2.8 g/dL — ABNORMAL LOW (ref 3.5–5.0)
Alkaline Phosphatase: 53 U/L (ref 38–126)
Anion gap: 12 (ref 5–15)
BUN: 30 mg/dL — ABNORMAL HIGH (ref 8–23)
CO2: 29 mmol/L (ref 22–32)
Calcium: 8.5 mg/dL — ABNORMAL LOW (ref 8.9–10.3)
Chloride: 98 mmol/L (ref 98–111)
Creatinine, Ser: 4.66 mg/dL — ABNORMAL HIGH (ref 0.61–1.24)
GFR, Estimated: 12 mL/min — ABNORMAL LOW (ref >60)
Glucose, Bld: 67 mg/dL — ABNORMAL LOW (ref 70–99)
Potassium: 3.3 mmol/L — ABNORMAL LOW (ref 3.5–5.1)
Sodium: 139 mmol/L (ref 135–145)
Total Bilirubin: 1.2 mg/dL (ref 0.3–1.2)
Total Protein: 5.6 g/dL — ABNORMAL LOW (ref 6.5–8.1)

## 2022-01-12 LAB — GLUCOSE, CAPILLARY
Glucose-Capillary: 152 mg/dL — ABNORMAL HIGH (ref 70–99)
Glucose-Capillary: 55 mg/dL — ABNORMAL LOW (ref 70–99)
Glucose-Capillary: 101 mg/dL — ABNORMAL HIGH (ref 70–99)
Glucose-Capillary: 81 mg/dL (ref 70–99)

## 2022-01-12 LAB — PROTIME-INR
INR: 1.5 — ABNORMAL HIGH (ref 0.8–1.2)
Prothrombin Time: 17.7 seconds — ABNORMAL HIGH (ref 11.4–15.2)

## 2022-01-12 LAB — LACTIC ACID, PLASMA
Lactic Acid, Venous: 3.4 mmol/L (ref 0.5–1.9)
Lactic Acid, Venous: 3.8 mmol/L (ref 0.5–1.9)

## 2022-01-12 LAB — TROPONIN I (HIGH SENSITIVITY)
Troponin I (High Sensitivity): 2957 ng/L (ref <18)
Troponin I (High Sensitivity): 4963 ng/L (ref <18)

## 2022-01-12 LAB — MAGNESIUM: Magnesium: 1.5 mg/dL — ABNORMAL LOW (ref 1.7–2.4)

## 2022-01-12 LAB — TSH: TSH: 5.816 u[IU]/mL — ABNORMAL HIGH (ref 0.350–4.500)

## 2022-01-12 LAB — APTT: aPTT: 42 seconds — ABNORMAL HIGH (ref 24–36)

## 2022-01-12 LAB — HEMOGLOBIN A1C
Hgb A1c MFr Bld: 5.2 % (ref 4.8–5.6)
Mean Plasma Glucose: 102.54 mg/dL

## 2022-01-12 LAB — BRAIN NATRIURETIC PEPTIDE: B Natriuretic Peptide: 1904.9 pg/mL — ABNORMAL HIGH (ref 0.0–100.0)

## 2022-01-12 LAB — CBG MONITORING, ED
Glucose-Capillary: 82 mg/dL (ref 70–99)
Glucose-Capillary: 89 mg/dL (ref 70–99)

## 2022-01-12 LAB — PHOSPHORUS: Phosphorus: 3.8 mg/dL (ref 2.5–4.6)

## 2022-01-12 LAB — MRSA NEXT GEN BY PCR, NASAL: MRSA by PCR Next Gen: NOT DETECTED

## 2022-01-12 MED ORDER — ETOMIDATE 2 MG/ML IV SOLN
INTRAVENOUS | Status: AC | PRN
  Administered 2022-01-12: 20 mg via INTRAVENOUS

## 2022-01-12 MED ORDER — NOREPINEPHRINE 4 MG/250ML-% IV SOLN
0.0000 ug/min | INTRAVENOUS | Status: DC
  Administered 2022-01-12: 2 ug/min via INTRAVENOUS
  Filled 2022-01-12: qty 250

## 2022-01-12 MED ORDER — POLYETHYLENE GLYCOL 3350 17 G PO PACK: 17.0000 g | PACK | Freq: Every day | ORAL | Status: DC | PRN

## 2022-01-12 MED ORDER — HEPARIN SODIUM (PORCINE) 5000 UNIT/ML IJ SOLN
5000.0000 [IU] | Freq: Three times a day (TID) | INTRAMUSCULAR | Status: DC
  Administered 2022-01-12 – 2022-01-13 (×4): 5000 [IU] via SUBCUTANEOUS
  Filled 2022-01-12 (×4): qty 1

## 2022-01-12 MED ORDER — PROPOFOL 1000 MG/100ML IV EMUL
INTRAVENOUS | Status: AC
  Administered 2022-01-12: 20 ug/kg/min via INTRAVENOUS
  Filled 2022-01-12: qty 100

## 2022-01-12 MED ORDER — LEVOTHYROXINE SODIUM 75 MCG PO TABS
150.0000 ug | ORAL_TABLET | Freq: Every day | ORAL | Status: DC
  Administered 2022-01-13 – 2022-01-30 (×16): 150 ug
  Filled 2022-01-12 (×16): qty 2

## 2022-01-12 MED ORDER — PROPOFOL 1000 MG/100ML IV EMUL: 5.0000 ug/kg/min | INTRAVENOUS | Status: DC

## 2022-01-12 MED ORDER — ROCURONIUM BROMIDE 50 MG/5ML IV SOLN: 60.0000 mg | Freq: Once | INTRAVENOUS | Status: DC

## 2022-01-12 MED ORDER — DOCUSATE SODIUM 50 MG/5ML PO LIQD: 100.0000 mg | Freq: Two times a day (BID) | ORAL | Status: DC

## 2022-01-12 MED ORDER — PROPOFOL 1000 MG/100ML IV EMUL
5.0000 ug/kg/min | INTRAVENOUS | Status: DC
  Filled 2022-01-12: qty 100

## 2022-01-12 MED ORDER — SODIUM CHLORIDE 0.9 % IV BOLUS
1000.0000 mL | Freq: Once | INTRAVENOUS | Status: AC
  Administered 2022-01-12: 1000 mL via INTRAVENOUS

## 2022-01-12 MED ORDER — DEXTROSE 50 % IV SOLN
INTRAVENOUS | Status: AC
  Filled 2022-01-12: qty 50

## 2022-01-12 MED ORDER — NOREPINEPHRINE 4 MG/250ML-% IV SOLN: 2.0000 ug/min | INTRAVENOUS | Status: DC

## 2022-01-12 MED ORDER — POLYETHYLENE GLYCOL 3350 17 G PO PACK: 17.0000 g | PACK | Freq: Every day | ORAL | Status: DC

## 2022-01-12 MED ORDER — SODIUM CHLORIDE 0.9 % IV SOLN
250.0000 mL | INTRAVENOUS | Status: DC
  Administered 2022-01-12: 250 mL via INTRAVENOUS

## 2022-01-12 MED ORDER — SODIUM CHLORIDE 0.9 % IV SOLN
1.0000 g | INTRAVENOUS | Status: DC
  Filled 2022-01-12: qty 10

## 2022-01-12 MED ORDER — SODIUM CHLORIDE 0.9 % IV SOLN
2.0000 g | Freq: Once | INTRAVENOUS | Status: AC
  Administered 2022-01-12: 2 g via INTRAVENOUS
  Filled 2022-01-12: qty 12.5

## 2022-01-12 MED ORDER — ORAL CARE MOUTH RINSE
15.0000 mL | OROMUCOSAL | Status: DC
  Administered 2022-01-12 – 2022-01-14 (×14): 15 mL via OROMUCOSAL

## 2022-01-12 MED ORDER — VANCOMYCIN HCL 1250 MG/250ML IV SOLN
1250.0000 mg | Freq: Once | INTRAVENOUS | Status: AC
  Administered 2022-01-12: 1250 mg via INTRAVENOUS
  Filled 2022-01-12: qty 250

## 2022-01-12 MED ORDER — VANCOMYCIN VARIABLE DOSE PER UNSTABLE RENAL FUNCTION (PHARMACIST DOSING): Status: DC

## 2022-01-12 MED ORDER — ETOMIDATE 2 MG/ML IV SOLN: 20.0000 mg | Freq: Once | INTRAVENOUS | Status: DC

## 2022-01-12 MED ORDER — DEXTROSE 50 % IV SOLN
12.5000 g | INTRAVENOUS | Status: AC
  Administered 2022-01-12: 12.5 g via INTRAVENOUS

## 2022-01-12 MED ORDER — IOHEXOL 350 MG/ML SOLN
75.0000 mL | Freq: Once | INTRAVENOUS | Status: AC | PRN
  Administered 2022-01-12: 75 mL via INTRAVENOUS

## 2022-01-12 MED ORDER — FENTANYL CITRATE PF 50 MCG/ML IJ SOSY
25.0000 ug | PREFILLED_SYRINGE | INTRAMUSCULAR | Status: DC | PRN
  Administered 2022-01-12: 50 ug via INTRAVENOUS
  Filled 2022-01-12: qty 1

## 2022-01-12 MED ORDER — PANTOPRAZOLE 2 MG/ML SUSPENSION
40.0000 mg | Freq: Every day | ORAL | Status: DC
  Administered 2022-01-13: 40 mg
  Filled 2022-01-12 (×2): qty 20

## 2022-01-12 MED ORDER — DOCUSATE SODIUM 50 MG/5ML PO LIQD: 100.0000 mg | Freq: Two times a day (BID) | ORAL | Status: DC | PRN

## 2022-01-12 MED ORDER — ORAL CARE MOUTH RINSE: 15.0000 mL | OROMUCOSAL | Status: DC | PRN

## 2022-01-12 MED ORDER — ROCURONIUM BROMIDE 50 MG/5ML IV SOLN
INTRAVENOUS | Status: AC | PRN
  Administered 2022-01-12: 60 mg via INTRAVENOUS

## 2022-01-12 MED ORDER — KETAMINE HCL 50 MG/5ML IJ SOSY
PREFILLED_SYRINGE | INTRAMUSCULAR | Status: AC
  Filled 2022-01-12: qty 10

## 2022-01-12 MED ORDER — ATORVASTATIN CALCIUM 10 MG PO TABS
20.0000 mg | ORAL_TABLET | Freq: Every day | ORAL | Status: DC
  Administered 2022-01-13 – 2022-01-30 (×17): 20 mg
  Filled 2022-01-12 (×17): qty 2

## 2022-01-12 MED ORDER — CHLORHEXIDINE GLUCONATE CLOTH 2 % EX PADS
6.0000 | MEDICATED_PAD | Freq: Every day | CUTANEOUS | Status: DC
  Administered 2022-01-12 – 2022-01-16 (×4): 6 via TOPICAL

## 2022-01-12 MED ORDER — DONEPEZIL HCL 10 MG PO TABS
10.0000 mg | ORAL_TABLET | Freq: Every day | ORAL | Status: DC
  Administered 2022-01-12 – 2022-01-29 (×16): 10 mg
  Filled 2022-01-12 (×20): qty 1

## 2022-01-12 NOTE — Progress Notes (Signed)
Pt transported from ED15 to 2M03 on vent without any complications.

## 2022-01-12 NOTE — Progress Notes (Signed)
VAST RN assessed R arm (L arm restricted) with ultrasound, no appropriate veins for ultrasound PIV placement for vasopressors at this time. VAST RN confirmed catheter placement on 2 existing IVs via ultrasound. Discussed with unit RN, plan to use IVs with confirmed placement along with IV watch if pressors are restarted. MD also at bedside and aware of plan.

## 2022-01-12 NOTE — Progress Notes (Signed)
Echocardiogram 2D Echocardiogram has been performed.  Oneal Deputy Tula Schryver RDCS 12/29/2021, 3:32 PM

## 2022-01-12 NOTE — Progress Notes (Signed)
Unable to collect ordered urine tests d/t severe oliguria. Bladder scan shows 0 mL, less than 2 mL of urine out.

## 2022-01-12 NOTE — Progress Notes (Signed)
EEG complete - results pending 

## 2022-01-12 NOTE — Progress Notes (Signed)
Pharmacy Antibiotic Note  Jeffrey Campbell is a 88 y.o. male admitted on 01/09/2022 with sepsis.  Pharmacy has been consulted for vancomycin and cefepime dosing.  Provider notes for admission not complete at this time. Patient has ESRD on HD, unknown schedule for this admission  Plan: Vancomycin '1250mg'$  IV once. Subsequent doses to be determined once HD plans are finalized. Will not need another dose unless he receives HD. Cefepime 1g Q24H  Height: '5\' 9"'$  (175.3 cm) Weight: 55.9 kg (123 lb 3.8 oz) IBW/kg (Calculated) : 70.7  Temp (24hrs), Avg:101.3 F (38.5 C), Min:101.3 F (38.5 C), Max:101.3 F (38.5 C)  Recent Labs  Lab 01/03/2022 1029  CREATININE 4.80*    Estimated Creatinine Clearance: 8.7 mL/min (A) (by C-G formula based on SCr of 4.8 mg/dL (H)).    Allergies  Allergen Reactions   Cardura [Doxazosin] Shortness Of Breath and Other (See Comments)    Dizziness    Hydrochlorothiazide Shortness Of Breath and Other (See Comments)    Dizziness    Iodinated Contrast Media Other (See Comments)    "shut down his kidneys" Patient has stage III chronic kidney disease   Zocor [Simvastatin] Other (See Comments)    Kidney problems    Protonix [Pantoprazole] Other (See Comments)    Worsening kidney problems   Rocaltrol [Calcitriol] Other (See Comments)    Unknown reaction   Budesonide-Formoterol Fumarate Other (See Comments)    Dry mouth   Minodyl [Minoxidil] Other (See Comments)    Unknown raction   Tiotropium Bromide Monohydrate Other (See Comments)    Dry mouth   Tricor [Fenofibrate]     Unknown reaction    Antimicrobials this admission: Vanc 10/17 >> Cefepime 10/17 >>  Thank you for allowing pharmacy to be a part of this patient's care.  Merrilee Jansky, PharmD Clinical Pharmacist 01/13/2022 10:32 AM

## 2022-01-12 NOTE — Progress Notes (Signed)
[facility] Progress Note Patient Name: Jeffrey Campbell DOB: [birth year] MRN: [id number]   Date of Service  01/18/2022  HPI/Events of Note  Pain - Nursing notes the patient is grimacing and guarding with turns and requests a Fentanyl IV infusion. Currently patient is sedated with Propofol IV infusion.   eICU Interventions  Plan: Fentanyl 25-50 mcg IV Q 2 hours PRN pain.      Intervention Category Major Interventions: Other:  Lysle Dingwall 01/07/2022, 8:34 PM

## 2022-01-12 NOTE — ED Triage Notes (Signed)
Pt bib EMS, daughter last saw him last night 2000 and this morning found him at home unresponsive and unable to arouse. Pt responds to painful stimuli only. BP with EMS 90/48. Capnography 35 with EMS.

## 2022-01-12 NOTE — Procedures (Signed)
Patient Name: Jeffrey Campbell  MRN: [id number]  Epilepsy Attending: Lora Havens  Referring Physician/Provider: Kipp Brood, MD  Date: 01/17/2022  Duration: 22.24 mins  Patient history: 88 year old male with altered mental status.  EEG to evaluate for seizure.  Level of alertness: Awake  AEDs during EEG study: None  Technical aspects: This EEG study was done with scalp electrodes positioned according to the 10-20 International system of electrode placement. Electrical activity was reviewed with band pass filter of 1-'70Hz'$ , sensitivity of 7 uV/mm, display speed of 44m/sec with a '60Hz'$  notched filter applied as appropriate. EEG data were recorded continuously and digitally stored.  Video monitoring was available and reviewed as appropriate.  Description: No clear posterior dominant rhythm was seen. EEG showed continuous generalized predominantly 5 to 6 Hz theta slowing admixed with intermittent generalized 2-'3Hz'$  delta slowing.  Hyperventilation and photic stimulation were not performed.     ABNORMALITY - Continuous slow, generalized  IMPRESSION: This study is suggestive of moderate diffuse encephalopathy, nonspecific etiology. No seizures or epileptiform discharges were seen throughout the recording.  [name] OBarbra Sarks

## 2022-01-12 NOTE — ED Provider Notes (Addendum)
[facility] EMERGENCY DEPARTMENT Provider Note  CSN: [id number] Arrival date & time: 01/26/2022 3570  Chief Complaint(s) Loss of Consciousness  HPI Jeffrey Campbell is a 88 y.o. male with PMH HTN, HLD, T2DM, hypothyroidism, COPD, OSA, CHF, PAD, CAD with complete heart block status post pacemaker placement, mitral prolapse with mitral regurgitation, ESRD on hemodialysis Monday Wednesday Friday who presents emergency department for evaluation of altered mental status.  Patient recently discharged on 11/18/2021 for altered mental status and UTI and has been doing well at home with home health.  He has been compliant with his dialysis and went to dialysis yesterday.  Patient told his daughter that he felt tired after dialysis yesterday and this morning she was unable to wake him.  She heard gurgling respirations and called 911.  On arrival, patient arrives hypotensive, responsive to painful stimuli only with gurgling respirations.  Additional history unable to be obtained as patient is currently altered and nonverbal secondary to his underlying medical condition   Past Medical History Past Medical History:  Diagnosis Date   Anemia    low iron   BPH (benign prostatic hyperplasia)    CAD (coronary artery disease)    Carotid artery stenosis    1-39% bilateral carotid artery stenosis and < 50% stenosis in the right CCA by dopplers 06/2017   CKD (chronic kidney disease), stage III (HCC)    Stage 4   Diastolic dysfunction    Glaucoma    Graves disease    History of ETOH abuse    Hyperlipemia    Hypertension    Hyperthyroidism 08/26/10   radioactive iodine therapy    Memory loss    Mitral regurgitation echo 2015   mild   Multiple thyroid nodules    MVP (mitral valve prolapse) 11/2012   posterior MVP   OSA (obstructive sleep apnea)    upper airway resistance syndrome with RDI 18/hr - not on CPAP due to insurance not covering   Pre-diabetes    PUD (peptic ulcer disease)     Pulmonary hypertension ([place]) echo 2015   Group 2 with pulmonary venous HTN and Group 3 with OSA   Upper airway resistance syndrome    Patient Active Problem List   Diagnosis Date Noted   COVID-19    Acute cystitis with hematuria    Encephalopathy acute 11/16/2021   Unspecified protein-calorie malnutrition ([place]) 08/21/2019   Iron deficiency anemia, unspecified 08/10/2019   Pain, unspecified 08/08/2019   Delirium    Palliative care encounter    Hypertension    ESRD (end stage renal disease) ([place])    Elevated troponin    Other specified coagulation defects ([place]) 07/20/2019   Allergy, unspecified, initial encounter 07/18/2019   Shortness of breath 07/18/2019   Benign prostatic hyperplasia without lower urinary tract symptoms 07/17/2019   Gastro-esophageal reflux disease without esophagitis 07/17/2019   Other specified postprocedural states 07/17/2019   Secondary hyperparathyroidism of renal origin ([place]) 07/17/2019   Thyrotoxicosis with diffuse goiter without thyrotoxic crisis or storm 07/17/2019   Unilateral inguinal hernia, without obstruction or gangrene, not specified as recurrent 07/17/2019   Complete heart block ([place]) 02/08/2019   Atrial flutter ([place])    Dementia without behavioral disturbance ([place]) 09/18/2018   Goals of care, counseling/discussion    Palliative care by specialist    DNR (do not resuscitate) discussion    Hypothermia 09/01/2018   GI bleeding 10/15/2016   GI bleed 10/15/2016   Anemia of chronic disease    CKD (chronic  kidney disease), stage V ([place])    Hypertensive urgency    Vascular dementia with behavioral disturbance ([place]) 93/57/0177   Acute metabolic encephalopathy    Branch retinal vein occlusion of right eye 05/07/2016   Graves' orbitopathy 05/07/2016   Primary open angle glaucoma of left eye, mild stage 05/07/2016   Primary open angle glaucoma of right eye, severe stage 05/07/2016   PAD (peripheral artery disease) ([place]) 04/20/2016   Bilateral renal  artery stenosis ([place]) 02/18/2015   Pulmonary hypertension ([place]) 02/10/2015   Chronic diastolic CHF (congestive heart failure) ([place]) 02/10/2015   Renal bruit 01/07/2015   Carotid artery stenosis    Diastolic dysfunction    Bradycardia 05/29/2013   Glaucoma 04/09/2011   COPD (chronic obstructive pulmonary disease) ([place]) 09/21/2010   OSA (obstructive sleep apnea)    Upper airway resistance syndrome    Hypothyroidism    Type 2 diabetes, diet controlled ([place])    CAD (coronary artery disease)    Hyperlipemia    Hypertensive heart and chronic kidney disease with heart failure and stage 1 through stage 4 chronic kidney disease, or chronic kidney disease (HCC)    PUD (peptic ulcer disease)    Home Medication(s) Prior to Admission medications   Medication Sig Start Date End Date Taking? Authorizing Provider  atorvastatin (LIPITOR) 20 MG tablet TAKE 1 BY MOUTH DAILY ABSOLUTE LAST REFILL WITHOUT OFFICE VISIT PLEASE CALL [phone] TO SCHEDULE Patient taking differently: Take 20 mg by mouth daily. 11/09/21   Larey Dresser, MD  Brinzolamide-Brimonidine [facility]) 1-0.2 % SUSP Place 1 drop into both eyes 2 (two) times daily.     [provider]  calcium acetate (PHOSLO) 667 MG capsule Take 667 mg by mouth See admin instructions. 667 mg 3 times daily with meals, 667 mg 2 times daily with snacks. 11/30/19   [provider]  donepezil (ARICEPT) 10 MG tablet Take 1 tablet (10 mg total) by mouth at bedtime. 08/17/21   Suzzanne Cloud, NP  doxazosin (CARDURA) 4 MG tablet Take 1 tablet (4 mg total) by mouth daily. Patient taking differently: Take 4 mg by mouth 2 (two) times daily. 02/12/19   Lyda Jester M, PA-C  levothyroxine (SYNTHROID, LEVOTHROID) 150 MCG tablet Take 150 mcg by mouth daily before breakfast.    [provider]  memantine (NAMENDA) 10 MG tablet TAKE 1 TABLET BY MOUTH TWICE A DAY Patient taking differently: Take 10 mg by mouth 2 (two) times daily. 01/19/21    Suzzanne Cloud, NP  multivitamin (RENA-VIT) TABS tablet Take 1 tablet by mouth daily. 11/01/19   [provider]                                                                                                                                    Past Surgical History Past Surgical History:  Procedure Laterality Date   APPENDECTOMY     AV FISTULA PLACEMENT Left 10/18/2017  Procedure: ARTERIOVENOUS (AV) FISTULA CREATION ARM;  Surgeon: Waynetta Sandy, MD;  Location: [facility];  Service: Vascular;  Laterality: Left;   CARDIAC CATHETERIZATION     CARDIAC CATHETERIZATION N/A 02/17/2015   Procedure: Right Heart Cath;  Surgeon: Larey Dresser, MD;  Location: [facility] CV LAB;  Service: Cardiovascular;  Laterality: N/A;   CARDIOVERSION N/A 02/08/2019   Procedure: CARDIOVERSION;  Surgeon: Donato Heinz, MD;  Location: [facility] [facility];  Service: Endoscopy;  Laterality: N/A;   ESOPHAGOGASTRODUODENOSCOPY (EGD) WITH PROPOFOL Left 10/16/2016   Procedure: ESOPHAGOGASTRODUODENOSCOPY (EGD) WITH PROPOFOL;  Surgeon: Ronnette Juniper, MD;  Location: [facility];  Service: Gastroenterology;  Laterality: Left;   heart catherization     HERNIA REPAIR  10/2009   IR RADIOLOGIST EVAL & MGMT  09/28/2016   IR RADIOLOGIST EVAL & MGMT  10/19/2016   IR RADIOLOGIST EVAL & MGMT  01/18/2017   PACEMAKER IMPLANT N/A 02/09/2019   Procedure: PACEMAKER IMPLANT;  Surgeon: Constance Haw, MD;  Location: [facility] CV LAB;  Service: Cardiovascular;  Laterality: N/A;   RIGHT HEART CATH N/A 07/28/2016   Procedure: Right Heart Cath;  Surgeon: Larey Dresser, MD;  Location: [facility] CV LAB;  Service: Cardiovascular;  Laterality: N/A;   Family History Family History  Problem Relation Age of Onset   Kidney failure Father    Hypertension Father    Colon cancer Mother    Colon cancer Brother    Lung disease Brother    Colon cancer Maternal Uncle     Social History Social History   Tobacco Use   Smoking  status: Former    Packs/day: 2.00    Years: 40.00    Total pack years: 80.00    Types: Cigarettes    Quit date: 03/29/1984    Years since quitting: 37.8   Smokeless tobacco: Never  Substance Use Topics   Alcohol use: No    Comment: quit in 1981   Drug use: No   Allergies Cardura [doxazosin], Hydrochlorothiazide, Iodinated contrast media, Zocor [simvastatin], Protonix [pantoprazole], Rocaltrol [calcitriol], Budesonide-formoterol fumarate, Minodyl [minoxidil], Tiotropium bromide monohydrate, and Tricor [fenofibrate]  Review of Systems Review of Systems  Unable to perform ROS: Acuity of condition    Physical Exam Vital Signs  I have reviewed the triage vital signs BP (!) 178/55 (BP Location: Right Arm)   Pulse 75   Temp (!) 101.3 F (38.5 C) (Rectal)   Resp 16   Ht _0  (1.753 m)   Wt 55.9 kg   SpO2 98%   BMI 18.20 kg/m   Physical Exam Vitals and nursing note reviewed.  Constitutional:      General: He is in acute distress.     Appearance: He is well-developed. He is ill-appearing and toxic-appearing.  HENT:     Head: Normocephalic and atraumatic.  Eyes:     Conjunctiva/sclera: Conjunctivae normal.  Cardiovascular:     Rate and Rhythm: Normal rate and regular rhythm.     Heart sounds: No murmur heard. Pulmonary:     Effort: Respiratory distress present.     Breath sounds: Rales present.  Abdominal:     Palpations: Abdomen is soft.     Tenderness: There is no abdominal tenderness.  Musculoskeletal:        General: No swelling.     Cervical back: Neck supple.  Skin:    General: Skin is warm and dry.     Capillary Refill: Capillary refill takes less than 2 seconds.  Neurological:  Mental Status: He is alert. He is disoriented.     ED Results and Treatments Labs (all labs ordered are listed, but only abnormal results are displayed) Labs Reviewed  LACTIC ACID, PLASMA - Abnormal; Notable for the following components:      Result Value   Lactic Acid,  Venous 3.4 (*)    All other components within normal limits  CBC WITH DIFFERENTIAL/PLATELET - Abnormal; Notable for the following components:   RBC 2.94 (*)    Hemoglobin 9.6 (*)    HCT 30.5 (*)    MCV 103.7 (*)    Platelets 94 (*)    All other components within normal limits  PROTIME-INR - Abnormal; Notable for the following components:   Prothrombin Time 17.7 (*)    INR 1.5 (*)    All other components within normal limits  APTT - Abnormal; Notable for the following components:   aPTT 42 (*)    All other components within normal limits  I-STAT CHEM 8, ED - Abnormal; Notable for the following components:   Potassium 3.3 (*)    Chloride 96 (*)    BUN 29 (*)    Creatinine, Ser 4.80 (*)    Glucose, Bld 68 (*)    Calcium, Ion 0.98 (*)    Hemoglobin 9.5 (*)    HCT 28.0 (*)    All other components within normal limits  I-STAT VENOUS BLOOD GAS, ED - Abnormal; Notable for the following components:   pH, Ven 7.521 (*)    pCO2, Ven 36.1 (*)    pO2, Ven 47 (*)    Bicarbonate 29.6 (*)    Acid-Base Excess 6.0 (*)    Potassium 3.3 (*)    Calcium, Ion 1.01 (*)    HCT 29.0 (*)    Hemoglobin 9.9 (*)    All other components within normal limits  I-STAT ARTERIAL BLOOD GAS, ED - Abnormal; Notable for the following components:   pO2, Arterial 240 (*)    Bicarbonate 29.4 (*)    Acid-Base Excess 5.0 (*)    Potassium 3.3 (*)    Calcium, Ion 1.13 (*)    HCT 29.0 (*)    Hemoglobin 9.9 (*)    All other components within normal limits  CULTURE, BLOOD (ROUTINE X 2)  CULTURE, BLOOD (ROUTINE X 2)  URINE CULTURE  LACTIC ACID, PLASMA  COMPREHENSIVE METABOLIC PANEL  URINALYSIS, ROUTINE W REFLEX MICROSCOPIC  BRAIN NATRIURETIC PEPTIDE  TSH  BLOOD GAS, ARTERIAL  CBG MONITORING, ED                                                                                                                          Radiology DG Chest Portable 1 View  Result Date: 01/19/2022 CLINICAL DATA:  88 year old male  intubated. EXAM: PORTABLE CHEST 1 VIEW COMPARISON:  Portable chest 1021 hours today and earlier. FINDINGS: Portable AP semi upright view at 1138 hours. Endotracheal tube tip in good position between the level the clavicles and  carina. Enteric tube courses to the abdomen, side hole is near the expected level of the GEJ at or just below the diaphragm. Stable lung volumes. Stable cardiomegaly and mediastinal contours. Right chest dual lead cardiac pacemaker. Left greater than right dense peribronchial and lower lobe opacity, no definite air bronchograms. Left pleural effusion is not excluded. Pulmonary vascularity does not appear significantly changed since August. No pneumothorax. Calcified aortic atherosclerosis. No acute osseous abnormality identified. IMPRESSION: 1. Endotracheal tube tip in good position. Enteric tube side hole near the GEJ, advanced 5 cm to ensure side hole placement inside the stomach. 2. Stable ventilation with confluent lower lung left > right opacity. Left pleural effusion is possible. No overt edema. Electronically Signed   By: Genevie Ann M.D.   On: 01/07/2022 11:49   DG Chest Port 1 View  Result Date: 01/18/2022 CLINICAL DATA:  Questionable sepsis EXAM: PORTABLE CHEST 1 VIEW COMPARISON:  11/16/2021 FINDINGS: Indistinct airspace disease on the left more than right. Cardiomegaly with dual-chamber pacer leads from the right. No definite effusion, although possible on the left. No pneumothorax. IMPRESSION: Bilateral airspace disease which could be edema or pneumonia. A similar appearance was seen 11/16/2021 Electronically Signed   By: Jorje Guild M.D.   On: 01/08/2022 10:30    Pertinent labs & imaging results that were available during my care of the patient were reviewed by me and considered in my medical decision making (see MDM for details).  Medications Ordered in ED Medications  vancomycin (VANCOREADY) IVPB 1250 mg/250 mL (1,250 mg Intravenous New Bag/Given 01/16/2022 1054)   norepinephrine (LEVOPHED) 18m in 2535m(0.016 mg/mL) premix infusion (4 mcg/min Intravenous Rate/Dose Change 01/03/2022 1101)  propofol (DIPRIVAN) 1000 MG/100ML infusion (20 mcg/kg/min  55.9 kg Intravenous New Bag/Given 12/28/2021 1141)  etomidate (AMIDATE) injection 20 mg (20 mg Intravenous Not Given 01/20/2022 1148)  rocuronium (ZEMURON) injection 60 mg (60 mg Intravenous Not Given 01/20/2022 1149)  vancomycin variable dose per unstable renal function (pharmacist dosing) (has no administration in time range)  ceFEPIme (MAXIPIME) 1 g in sodium chloride 0.9 % 100 mL IVPB (has no administration in time range)  ceFEPIme (MAXIPIME) 2 g in sodium chloride 0.9 % 100 mL IVPB (0 g Intravenous Stopped 12/31/2021 1050)  etomidate (AMIDATE) injection (20 mg Intravenous Given 01/14/2022 1121)  rocuronium (ZEMURON) injection (60 mg Intravenous Given 01/03/2022 1121)                                                                                                                                     Procedures .Critical Care  Performed by: KoTeressa LowerMD Authorized by: KoTeressa LowerMD   Critical care provider statement:    Critical care time (minutes):  80   Critical care was necessary to treat or prevent imminent or life-threatening deterioration of the following conditions:  Sepsis   Critical care was time spent personally by me on the following activities:  Development  of treatment plan with patient or surrogate, discussions with consultants, evaluation of patient's response to treatment, examination of patient, ordering and review of laboratory studies, ordering and review of radiographic studies, ordering and performing treatments and interventions, pulse oximetry, re-evaluation of patient's condition and review of old charts Procedure Name: Intubation Date/Time: 01/15/2022 1:28 PM  Performed by: Teressa Lower, MDPre-anesthesia Checklist: Patient identified, Patient being monitored, Emergency Drugs  available, Timeout performed and Suction available Oxygen Delivery Method: Non-rebreather mask Preoxygenation: Pre-oxygenation with 100% oxygen Induction Type: Rapid sequence Ventilation: Mask ventilation without difficulty Laryngoscope Size: Mac, Glidescope and 3 Grade View: Grade I Tube size: 7.5 mm Number of attempts: 1 Airway Equipment and Method: Video-laryngoscopy Placement Confirmation: ETT inserted through vocal cords under direct vision, CO2 detector and Breath sounds checked- equal and bilateral Secured at: 25 cm Tube secured with: ETT holder      (including critical care time)  Medical Decision Making / ED Course   This patient presents to the ED for concern of altered mental status, respiratory distress, this involves an extensive number of treatment options, and is a complaint that carries with it a high risk of complications and morbidity.  The differential diagnosis includes aspiration pneumonia, fluid overload, hypercarbia, urosepsis, pneumothorax  MDM: Patient seen in the emergency room for evaluation of altered mental status and respiratory stress.  Initial physical exam reveals a toxic appearing patient who localizes to pain only.  Rales heard bilaterally.  Initial x-ray with bilateral infiltrates, unsure if this is pulmonary edema or pneumonia.  Patient arrives hypotensive and after 1 L lactated Ringer's, pressures only minimally improved and given unknown fluid status in the setting of an ESRD patient, we made decision to initiate Levophed early and maximize patient's hemodynamics prior to intubation.  Patient ultimately required intubation for airway protection given inability to protect his airway and tachypnea with accessory muscle use.  Intubation performed with etomidate and rocuronium without complications.  Broad-spectrum antibiotics initiated immediately on arrival.  Follow-up imaging shows concern for aspiration pneumonia and anasarca.  Laboratory evaluation  with no significant leukocytosis, hemoglobin 9.6, potassium 3.3, BUN 30, creatinine 4.66 consistent with his history of ESRD on dialysis, BNP is elevated to 1904.9, INR 1.5, lactic acid elevated to 3.4.  Patient then admitted to the ICU.  Of note, patient does have abnormal tissue on the hard palate on the right near tooth 1-3.  May represent tissue damage from vomiting but cannot rule out squamous cell.  Additional history obtained: -Additional history obtained from daughter -External records from outside source obtained and reviewed including: Chart review including previous notes, labs, imaging, consultation notes   Lab Tests: -I ordered, reviewed, and interpreted labs.   The pertinent results include:   Labs Reviewed  LACTIC ACID, PLASMA - Abnormal; Notable for the following components:      Result Value   Lactic Acid, Venous 3.4 (*)    All other components within normal limits  CBC WITH DIFFERENTIAL/PLATELET - Abnormal; Notable for the following components:   RBC 2.94 (*)    Hemoglobin 9.6 (*)    HCT 30.5 (*)    MCV 103.7 (*)    Platelets 94 (*)    All other components within normal limits  PROTIME-INR - Abnormal; Notable for the following components:   Prothrombin Time 17.7 (*)    INR 1.5 (*)    All other components within normal limits  APTT - Abnormal; Notable for the following components:   aPTT 42 (*)    All  other components within normal limits  I-STAT CHEM 8, ED - Abnormal; Notable for the following components:   Potassium 3.3 (*)    Chloride 96 (*)    BUN 29 (*)    Creatinine, Ser 4.80 (*)    Glucose, Bld 68 (*)    Calcium, Ion 0.98 (*)    Hemoglobin 9.5 (*)    HCT 28.0 (*)    All other components within normal limits  I-STAT VENOUS BLOOD GAS, ED - Abnormal; Notable for the following components:   pH, Ven 7.521 (*)    pCO2, Ven 36.1 (*)    pO2, Ven 47 (*)    Bicarbonate 29.6 (*)    Acid-Base Excess 6.0 (*)    Potassium 3.3 (*)    Calcium, Ion 1.01 (*)     HCT 29.0 (*)    Hemoglobin 9.9 (*)    All other components within normal limits  I-STAT ARTERIAL BLOOD GAS, ED - Abnormal; Notable for the following components:   pO2, Arterial 240 (*)    Bicarbonate 29.4 (*)    Acid-Base Excess 5.0 (*)    Potassium 3.3 (*)    Calcium, Ion 1.13 (*)    HCT 29.0 (*)    Hemoglobin 9.9 (*)    All other components within normal limits  CULTURE, BLOOD (ROUTINE X 2)  CULTURE, BLOOD (ROUTINE X 2)  URINE CULTURE  LACTIC ACID, PLASMA  COMPREHENSIVE METABOLIC PANEL  URINALYSIS, ROUTINE W REFLEX MICROSCOPIC  BRAIN NATRIURETIC PEPTIDE  TSH  BLOOD GAS, ARTERIAL  CBG MONITORING, ED      EKG  Paced ventricular rhythm with atrial fibrillation  Imaging Studies ordered: I ordered imaging studies including chest x-ray, CT head, chest abdomen pelvis I independently visualized and interpreted imaging. I agree with the radiologist interpretation   Medicines ordered and prescription drug management: Meds ordered this encounter  Medications   vancomycin (VANCOREADY) IVPB 1250 mg/250 mL    Order Specific Question:   Indication:    Answer:   Sepsis   ceFEPIme (MAXIPIME) 2 g in sodium chloride 0.9 % 100 mL IVPB    Order Specific Question:   Antibiotic Indication:    Answer:   Sepsis   norepinephrine (LEVOPHED) 55m in 2532m(0.016 mg/mL) premix infusion   DISCONTD: ketamine HCl 50 MG/5ML SOSY    Heetderks, Kendall A: cabinet override   propofol (DIPRIVAN) 1000 MG/100ML infusion    Bell, Lorin C: cabinet override   etomidate (AMIDATE) injection   rocuronium (ZEMURON) injection   DISCONTD: propofol (DIPRIVAN) 1000 MG/100ML infusion   propofol (DIPRIVAN) 1000 MG/100ML infusion   etomidate (AMIDATE) injection 20 mg   rocuronium (ZEMURON) injection 60 mg   vancomycin variable dose per unstable renal function (pharmacist dosing)   ceFEPIme (MAXIPIME) 1 g in sodium chloride 0.9 % 100 mL IVPB    Order Specific Question:   Antibiotic Indication:    Answer:    Sepsis    -I have reviewed the patients home medicines and have made adjustments as needed  Critical interventions Pressor support, intubation, broad-spectrum antibiotics, fluid resuscitation  Consultations Obtained: I requested consultation with the intensivist,  and discussed lab and imaging findings as well as pertinent plan - they recommend: ICU admission   Cardiac Monitoring: The patient was maintained on a cardiac monitor.  I personally viewed and interpreted the cardiac monitored which showed an underlying rhythm of: Atrial fibrillation with paced ventricular rhythm  Social Determinants of Health:  Factors impacting patients care include: Lives at home with  family   Reevaluation: After the interventions noted above, I reevaluated the patient and found that they have :improved  Co morbidities that complicate the patient evaluation  Past Medical History:  Diagnosis Date   Anemia    low iron   BPH (benign prostatic hyperplasia)    CAD (coronary artery disease)    Carotid artery stenosis    1-39% bilateral carotid artery stenosis and < 50% stenosis in the right CCA by dopplers 06/2017   CKD (chronic kidney disease), stage III (HCC)    Stage 4   Diastolic dysfunction    Glaucoma    Graves disease    History of ETOH abuse    Hyperlipemia    Hypertension    Hyperthyroidism 08/26/10   radioactive iodine therapy    Memory loss    Mitral regurgitation echo 2015   mild   Multiple thyroid nodules    MVP (mitral valve prolapse) 11/2012   posterior MVP   OSA (obstructive sleep apnea)    upper airway resistance syndrome with RDI 18/hr - not on CPAP due to insurance not covering   Pre-diabetes    PUD (peptic ulcer disease)    Pulmonary hypertension ([place]) echo 2015   Group 2 with pulmonary venous HTN and Group 3 with OSA   Upper airway resistance syndrome       Dispostion: I considered admission for this patient, and due to his sepsis secondary to aspiration pneumonia and  need for intubation, patient will require hospital admission     Final Clinical Impression(s) / ED Diagnoses Final diagnoses:  None     _0 @    Teressa Lower, MD 01/01/2022 [place]    Teressa Lower, MD 01/17/2022 1338

## 2022-01-12 NOTE — ED Notes (Signed)
Critical lactic of 3.4 MD notified

## 2022-01-12 NOTE — Progress Notes (Signed)
Pt transported from Trauma A to CT1 and back to ED15 on vent without any complications.

## 2022-01-12 NOTE — H&P (Signed)
NAME:  Jeffrey Campbell, MRN:  [id number], DOB:  Jun 04, 1932, LOS: 0 ADMISSION DATE:  01/01/2022, CONSULTATION DATE:  01/10/2022 REFERRING MD:  Dr. Matilde Sprang , CHIEF COMPLAINT:  Encephalopathy, Respiratory Failure    History of Present Illness:  88 year old male with extensive PMH including ESRD on HD MWF. Presents to ED on 10/17 after being found unresponsive at home. Per family patient recent admission 8/23 for altered mental status, UTI. Now with reported tiredness, weakness over last 24 hours. This morning when daughter arrived at patient home, she reports patient was unresponsive making gurgling sounds, on arrival to ED patient remains minimally responsive with tachypnea and hypotension requiring intubation. CXR with left lower lobe infiltrate. CT head negative. CT C/A/P with extensive bilateral infiltirates and noted left lower lobe infiltrate concerning for aspiration. Given Cefepime/Vancomycin. Critical Care Consulted for admission.   Pertinent  Medical History  Htn, hld, t2dm, hypothyroidism, COPD, OSA, PAD, CAD w/ h/o CHB and PPM. MVP w/ MR, ESRD MWF  Significant Hospital Events: Including procedures, antibiotic start and stop dates in addition to other pertinent events   10/17 Presents to ED   Interim History / Subjective:  As above.   Objective   Blood pressure (Abnormal) 138/59, pulse 60, temperature 99.7 F (37.6 C), resp. rate 16, height '5\' 9"'$  (1.753 m), weight 55.9 kg, SpO2 100 %.    Vent Mode: PRVC FiO2 (%):  [40 %-100 %] 40 % Set Rate:  [16 bmp] 16 bmp Vt Set:  [560 mL] 560 mL PEEP:  [5 cmH20] 5 cmH20 Plateau Pressure:  [18 cmH20] 18 cmH20   Intake/Output Summary (Last 24 hours) at 01/05/2022 1253 Last data filed at 01/13/2022 1242 Gross per 24 hour  Intake 367.19 ml  Output no documentation  Net 367.19 ml   Filed Weights   01/10/2022 1013  Weight: 55.9 kg    Examination: General: Elderly male, lying in bed on vent  HENT: ETT/OG in place  Lungs: Rhonchi, no  wheeze, mild tachypnea  Cardiovascular: HR 63, regular, no mRG Abdomen: soft, non-distended, active bowel sounds  Extremities: -edema Neuro: sedated, +cough/gag, withdrawals in all extermities with nailbed pressure, pupils 2 mm and sluggish  GU: foley in place   Resolved Hospital Problem list     Assessment & Plan:   Acute Encephalopathy, presumed in setting of active infection - CT head negative  Dementia  Plan - EEG pending  - Continue home Aricept  - Treatment as below  - Sedation Needs: Titrate Propofol for RASS goal 0/-1  Respiratory Insufficieny in setting of above.  COPD not in acute exacerbation  OSA Recent COVID + 10/2021 Plan - Vent Support - VAP prevention bundle in place  Shock State, appears septic in nature in setting of PNA, as evidence by hypotension, lactic acidosis  CAD, CHB s/p pacermaker  HTN HLD Plan - Cardiac Monitoring - Titrate levophed for MAP goal >65 (currently receiving 2 L bolus)  - Obtain Troponin  - PAN Culture, Cefepime/Vancomycin for now  - Obtain ECHO  - Continue Statin   Anion Gap Lactic Acidosis in setting of hypoperfusion, shock  ESRD on HD MWF  Plan - Consult Nephrology  - Trend BMP - Trend Lactic Acid   Hypothyroidism  Plan - Continue home Synthroid   DM Hypoglycemia  Plan - Trend glucose - Hypoglycemia protocol   Best Practice (right click and "Reselect all SmartList Selections" daily)   Diet/type: NPO DVT prophylaxis: prophylactic heparin  GI prophylaxis: PPI Lines: N/A Foley:  Yes, and it is still needed Code Status:  full code Last date of multidisciplinary goals of care discussion [pending]  Labs   CBC: Recent Labs  Lab 01/11/2022 1012 01/13/2022 1029 01/02/2022 1137 01/16/2022 1146  WBC 7.0  --   --   --   NEUTROABS PENDING  --   --   --   HGB 9.6* 9.5* 9.9* 9.9*  HCT 30.5* 28.0* 29.0* 29.0*  MCV 103.7*  --   --   --   PLT 94*  --   --   --     Basic Metabolic Panel: Recent Labs  Lab  01/05/2022 1012 01/25/2022 1029 01/20/2022 1137 01/09/2022 1146  NA 139 138 138 139  K 3.3* 3.3* 3.3* 3.3*  CL 98 96*  --   --   CO2 29  --   --   --   GLUCOSE 67* 68*  --   --   BUN 30* 29*  --   --   CREATININE 4.66* 4.80*  --   --   CALCIUM 8.5*  --   --   --    GFR: Estimated Creatinine Clearance: 8.7 mL/min (A) (by C-G formula based on SCr of 4.8 mg/dL (H)). Recent Labs  Lab 01/08/2022 1012  WBC 7.0  LATICACIDVEN 3.4*    Liver Function Tests: Recent Labs  Lab 01/15/2022 1012  AST 59*  ALT 33  ALKPHOS 53  BILITOT 1.2  PROT 5.6*  ALBUMIN 2.8*   No results for input(s): "LIPASE", "AMYLASE" in the last 168 hours. No results for input(s): "AMMONIA" in the last 168 hours.  ABG    Component Value Date/Time   PHART 7.445 01/26/2022 1146   PCO2ART 43.4 12/27/2021 1146   PO2ART 240 (H) 12/27/2021 1146   HCO3 29.4 (H) 01/18/2022 1146   TCO2 31 01/10/2022 1146   ACIDBASEDEF 5.0 (H) 07/28/2016 0850   ACIDBASEDEF 5.0 (H) 07/28/2016 0850   O2SAT 100 12/28/2021 1146     Coagulation Profile: Recent Labs  Lab 01/10/2022 1012  INR 1.5*    Cardiac Enzymes: No results for input(s): "CKTOTAL", "CKMB", "CKMBINDEX", "TROPONINI" in the last 168 hours.  HbA1C: Hgb A1c MFr Bld  Date/Time Value Ref Range Status  09/19/2016 04:07 AM 5.5 4.8 - 5.6 % Final    Comment:    (NOTE)         Pre-diabetes: 5.7 - 6.4         Diabetes: >6.4         Glycemic control for adults with diabetes: <7.0     CBG: Recent Labs  Lab 01/10/2022 1021  GLUCAP 82    Review of Systems:   Unable to review   Past Medical History:  He,  has a past medical history of Anemia, BPH (benign prostatic hyperplasia), CAD (coronary artery disease), Carotid artery stenosis, CKD (chronic kidney disease), stage III ([place]), Diastolic dysfunction, Glaucoma, Graves disease, History of ETOH abuse, Hyperlipemia, Hypertension, Hyperthyroidism (08/26/10), Memory loss, Mitral regurgitation (echo 2015), Multiple thyroid  nodules, MVP (mitral valve prolapse) (11/2012), OSA (obstructive sleep apnea), Pre-diabetes, PUD (peptic ulcer disease), Pulmonary hypertension ([place]) (echo 2015), and Upper airway resistance syndrome.   Surgical History:   Past Surgical History:  Procedure Laterality Date   APPENDECTOMY     AV FISTULA PLACEMENT Left 10/18/2017   Procedure: ARTERIOVENOUS (AV) FISTULA CREATION ARM;  Surgeon: Waynetta Sandy, MD;  Location: [place];  Service: Vascular;  Laterality: Left;   [place]  N/A 02/17/2015   Procedure: Right Heart Cath;  Surgeon: Larey Dresser, MD;  Location: [place];  Service: Cardiovascular;  Laterality: N/A;   CARDIOVERSION N/A 02/08/2019   Procedure: CARDIOVERSION;  Surgeon: Donato Heinz, MD;  Location: [place];  Service: Endoscopy;  Laterality: N/A;   ESOPHAGOGASTRODUODENOSCOPY (EGD) WITH PROPOFOL Left 10/16/2016   Procedure: ESOPHAGOGASTRODUODENOSCOPY (EGD) WITH PROPOFOL;  Surgeon: Ronnette Juniper, MD;  Location: [place];  Service: Gastroenterology;  Laterality: Left;   heart catherization     HERNIA REPAIR  10/2009   IR RADIOLOGIST EVAL & MGMT  09/28/2016   IR RADIOLOGIST EVAL & MGMT  10/19/2016   IR RADIOLOGIST EVAL & MGMT  01/18/2017   PACEMAKER IMPLANT N/A 02/09/2019   Procedure: PACEMAKER IMPLANT;  Surgeon: Constance Haw, MD;  Location: [facility];  Service: Cardiovascular;  Laterality: N/A;   RIGHT HEART CATH N/A 07/28/2016   Procedure: Right Heart Cath;  Surgeon: Larey Dresser, MD;  Location: [facility];  Service: Cardiovascular;  Laterality: N/A;     Social History:   reports that he quit smoking about 37 years ago. His smoking use included cigarettes. He has a 80.00 pack-year smoking history. He has never used smokeless tobacco. He reports that he does not drink alcohol and does not use drugs.   Family History:  His family history includes Colon cancer in his brother,  maternal uncle, and mother; Hypertension in his father; Kidney failure in his father; Lung disease in his brother.   Allergies Allergies  Allergen Reactions   Cardura [Doxazosin] Shortness Of Breath and Other (See Comments)    Dizziness    Hydrochlorothiazide Shortness Of Breath and Other (See Comments)    Dizziness    Iodinated Contrast Media Other (See Comments)    "shut down his kidneys" Patient has stage III chronic kidney disease   Zocor [Simvastatin] Other (See Comments)    Kidney problems    Protonix [Pantoprazole] Other (See Comments)    Worsening kidney problems   Rocaltrol [Calcitriol] Other (See Comments)    Unknown reaction   Budesonide-Formoterol Fumarate Other (See Comments)    Dry mouth   Minodyl [Minoxidil] Other (See Comments)    Unknown raction   Tiotropium Bromide Monohydrate Other (See Comments)    Dry mouth   Tricor [Fenofibrate]     Unknown reaction     Home Medications  Prior to Admission medications   Medication Sig Start Date End Date Taking? Authorizing Provider  atorvastatin (LIPITOR) 20 MG tablet TAKE 1 BY MOUTH DAILY ABSOLUTE LAST REFILL WITHOUT OFFICE VISIT PLEASE CALL [phone] TO SCHEDULE Patient taking differently: Take 20 mg by mouth daily. 11/09/21   Larey Dresser, MD  Brinzolamide-Brimonidine [facility]) 1-0.2 % SUSP Place 1 drop into both eyes 2 (two) times daily.     [provider]  calcium acetate (PHOSLO) 667 MG capsule Take 667 mg by mouth See admin instructions. 667 mg 3 times daily with meals, 667 mg 2 times daily with snacks. 11/30/19   [provider]  donepezil (ARICEPT) 10 MG tablet Take 1 tablet (10 mg total) by mouth at bedtime. 08/17/21   Suzzanne Cloud, NP  doxazosin (CARDURA) 4 MG tablet Take 1 tablet (4 mg total) by mouth daily. Patient taking differently: Take 4 mg by mouth 2 (two) times daily. 02/12/19   Lyda Jester M, PA-C  levothyroxine (SYNTHROID, LEVOTHROID) 150 MCG tablet Take 150 mcg by  mouth daily before breakfast.  [provider]  memantine (NAMENDA) 10 MG tablet TAKE 1 TABLET BY MOUTH TWICE A DAY Patient taking differently: Take 10 mg by mouth 2 (two) times daily. 01/19/21   Suzzanne Cloud, NP  multivitamin (RENA-VIT) TABS tablet Take 1 tablet by mouth daily. 11/01/19   [provider]     Critical care time: 80 minutes     Hayden Pedro, AGACNP-BC [facility]  PCCM Pgr: [phone]

## 2022-01-13 ENCOUNTER — Inpatient Hospital Stay (HOSPITAL_COMMUNITY): Payer: Medicare Other

## 2022-01-13 DIAGNOSIS — A419 Sepsis, unspecified organism: Secondary | ICD-10-CM | POA: Insufficient documentation

## 2022-01-13 DIAGNOSIS — R531 Weakness: Secondary | ICD-10-CM | POA: Diagnosis not present

## 2022-01-13 DIAGNOSIS — D631 Anemia in chronic kidney disease: Secondary | ICD-10-CM | POA: Diagnosis not present

## 2022-01-13 DIAGNOSIS — E43 Unspecified severe protein-calorie malnutrition: Secondary | ICD-10-CM | POA: Diagnosis present

## 2022-01-13 DIAGNOSIS — I214 Non-ST elevation (NSTEMI) myocardial infarction: Secondary | ICD-10-CM | POA: Insufficient documentation

## 2022-01-13 DIAGNOSIS — N186 End stage renal disease: Secondary | ICD-10-CM | POA: Diagnosis not present

## 2022-01-13 DIAGNOSIS — R4182 Altered mental status, unspecified: Secondary | ICD-10-CM | POA: Diagnosis not present

## 2022-01-13 DIAGNOSIS — J9601 Acute respiratory failure with hypoxia: Secondary | ICD-10-CM | POA: Diagnosis not present

## 2022-01-13 DIAGNOSIS — I4819 Other persistent atrial fibrillation: Secondary | ICD-10-CM | POA: Diagnosis not present

## 2022-01-13 DIAGNOSIS — N2581 Secondary hyperparathyroidism of renal origin: Secondary | ICD-10-CM | POA: Diagnosis not present

## 2022-01-13 DIAGNOSIS — Z992 Dependence on renal dialysis: Secondary | ICD-10-CM | POA: Diagnosis not present

## 2022-01-13 DIAGNOSIS — E8779 Other fluid overload: Secondary | ICD-10-CM | POA: Diagnosis not present

## 2022-01-13 DIAGNOSIS — G9340 Encephalopathy, unspecified: Secondary | ICD-10-CM | POA: Diagnosis not present

## 2022-01-13 DIAGNOSIS — I120 Hypertensive chronic kidney disease with stage 5 chronic kidney disease or end stage renal disease: Secondary | ICD-10-CM | POA: Diagnosis not present

## 2022-01-13 DIAGNOSIS — J189 Pneumonia, unspecified organism: Secondary | ICD-10-CM | POA: Diagnosis not present

## 2022-01-13 LAB — CBC
HCT: 28 % — ABNORMAL LOW (ref 39.0–52.0)
Hemoglobin: 9.2 g/dL — ABNORMAL LOW (ref 13.0–17.0)
MCH: 32.5 pg (ref 26.0–34.0)
MCHC: 32.9 g/dL (ref 30.0–36.0)
MCV: 98.9 fL (ref 80.0–100.0)
Platelets: 75 10*3/uL — ABNORMAL LOW (ref 150–400)
RBC: 2.83 MIL/uL — ABNORMAL LOW (ref 4.22–5.81)
RDW: 15 % (ref 11.5–15.5)
WBC: 7.9 10*3/uL (ref 4.0–10.5)
nRBC: 0 % (ref 0.0–0.2)

## 2022-01-13 LAB — TRIGLYCERIDES: Triglycerides: 111 mg/dL (ref <150)

## 2022-01-13 LAB — BLOOD CULTURE ID PANEL (REFLEXED) - BCID2

## 2022-01-13 LAB — RENAL FUNCTION PANEL
Albumin: 2.8 g/dL — ABNORMAL LOW (ref 3.5–5.0)
Anion gap: 17 — ABNORMAL HIGH (ref 5–15)
BUN: 51 mg/dL — ABNORMAL HIGH (ref 8–23)
CO2: 23 mmol/L (ref 22–32)
Calcium: 8.9 mg/dL (ref 8.9–10.3)
Chloride: 97 mmol/L — ABNORMAL LOW (ref 98–111)
Creatinine, Ser: 6.02 mg/dL — ABNORMAL HIGH (ref 0.61–1.24)
GFR, Estimated: 9 mL/min — ABNORMAL LOW (ref >60)
Glucose, Bld: 85 mg/dL (ref 70–99)
Phosphorus: 4.3 mg/dL (ref 2.5–4.6)
Potassium: 3.3 mmol/L — ABNORMAL LOW (ref 3.5–5.1)
Sodium: 137 mmol/L (ref 135–145)

## 2022-01-13 LAB — BASIC METABOLIC PANEL
Anion gap: 17 — ABNORMAL HIGH (ref 5–15)
BUN: 46 mg/dL — ABNORMAL HIGH (ref 8–23)
CO2: 24 mmol/L (ref 22–32)
Calcium: 8.9 mg/dL (ref 8.9–10.3)
Chloride: 97 mmol/L — ABNORMAL LOW (ref 98–111)
Creatinine, Ser: 5.59 mg/dL — ABNORMAL HIGH (ref 0.61–1.24)
GFR, Estimated: 9 mL/min — ABNORMAL LOW (ref >60)
Glucose, Bld: 66 mg/dL — ABNORMAL LOW (ref 70–99)
Potassium: 3.4 mmol/L — ABNORMAL LOW (ref 3.5–5.1)
Sodium: 138 mmol/L (ref 135–145)

## 2022-01-13 LAB — GLUCOSE, CAPILLARY
Glucose-Capillary: 77 mg/dL (ref 70–99)
Glucose-Capillary: 92 mg/dL (ref 70–99)
Glucose-Capillary: 77 mg/dL (ref 70–99)
Glucose-Capillary: 84 mg/dL (ref 70–99)
Glucose-Capillary: 75 mg/dL (ref 70–99)
Glucose-Capillary: 81 mg/dL (ref 70–99)

## 2022-01-13 LAB — MAGNESIUM
Magnesium: 1.7 mg/dL (ref 1.7–2.4)
Magnesium: 1.5 mg/dL — ABNORMAL LOW (ref 1.7–2.4)

## 2022-01-13 LAB — PHOSPHORUS
Phosphorus: 3.9 mg/dL (ref 2.5–4.6)
Phosphorus: 1.7 mg/dL — ABNORMAL LOW (ref 2.5–4.6)

## 2022-01-13 LAB — T4, FREE: Free T4: 1 ng/dL (ref 0.61–1.12)

## 2022-01-13 MED ORDER — HEPARIN SODIUM (PORCINE) 1000 UNIT/ML IJ SOLN
INTRAMUSCULAR | Status: AC
  Administered 2022-01-13: 3000 [IU]
  Filled 2022-01-13: qty 3

## 2022-01-13 MED ORDER — SODIUM CHLORIDE 0.9 % IV SOLN
2.0000 g | INTRAVENOUS | Status: DC
  Administered 2022-01-13 – 2022-01-16 (×4): 2 g via INTRAVENOUS
  Filled 2022-01-13 (×4): qty 20

## 2022-01-13 MED ORDER — PEPTAMEN 1.5 CAL PO LIQD
1000.0000 mL | ORAL | Status: DC
  Administered 2022-01-13: 1000 mL
  Filled 2022-01-13 (×2): qty 1000

## 2022-01-13 MED ORDER — DEXTROSE 5 % IV SOLN: INTRAVENOUS | Status: DC

## 2022-01-13 MED ORDER — VANCOMYCIN HCL 500 MG/100ML IV SOLN
500.0000 mg | INTRAVENOUS | Status: DC
  Administered 2022-01-13: 500 mg via INTRAVENOUS
  Filled 2022-01-13 (×5): qty 100

## 2022-01-13 MED ORDER — VANCOMYCIN HCL 500 MG/100ML IV SOLN: 500.0000 mg | INTRAVENOUS | Status: DC

## 2022-01-13 MED ORDER — HEPARIN (PORCINE) 25000 UT/250ML-% IV SOLN
1350.0000 [IU]/h | INTRAVENOUS | Status: DC
  Administered 2022-01-13: 650 [IU]/h via INTRAVENOUS
  Administered 2022-01-14: 1000 [IU]/h via INTRAVENOUS
  Filled 2022-01-13 (×2): qty 250

## 2022-01-13 MED ORDER — BANATROL TF EN LIQD
60.0000 mL | Freq: Two times a day (BID) | ENTERAL | Status: DC
  Administered 2022-01-13: 60 mL
  Filled 2022-01-13 (×3): qty 60

## 2022-01-13 MED ORDER — PROSOURCE TF20 ENFIT COMPATIBL EN LIQD
60.0000 mL | Freq: Every day | ENTERAL | Status: DC
  Administered 2022-01-13: 60 mL
  Filled 2022-01-13: qty 60

## 2022-01-13 MED ORDER — RENA-VITE PO TABS
1.0000 | ORAL_TABLET | Freq: Every day | ORAL | Status: DC
  Administered 2022-01-15 – 2022-01-23 (×9): 1
  Filled 2022-01-13 (×9): qty 1

## 2022-01-13 MED ORDER — CHLORHEXIDINE GLUCONATE CLOTH 2 % EX PADS
6.0000 | MEDICATED_PAD | Freq: Every day | CUTANEOUS | Status: DC
  Administered 2022-01-13 (×2): 6 via TOPICAL

## 2022-01-13 NOTE — Progress Notes (Signed)
Pt receives out-pt HD at [facility] ([address]) on MWF. Pt arrives at 10:35 for 10:55 chair time. Will assist as needed.   Trev Boley Renal Navigator [phone] 

## 2022-01-13 NOTE — Progress Notes (Signed)
PHARMACY - PHYSICIAN COMMUNICATION CRITICAL VALUE ALERT - BLOOD CULTURE IDENTIFICATION (BCID)  Jeffrey Campbell is an 88 y.o. male who presented to [facility] on 01/22/2022 with a chief complaint of unresponsiveness.  Assessment:  Admitted with encephalopathy in setting of shock state d/t PNA, started on broad-spectrum ABX, now growing Strep pneumo in 3 of 3 blood cx bottles.  Name of physician (or Provider) Contacted: SSommer MD  Current antibiotics: vancomycin and cefepime  Changes to prescribed antibiotics recommended:  Patient is on recommended antibiotics - No changes needed; could consider narrowing to ceftriaxone 2g IV Q24H if other microbes ruled out.  Results for orders placed or performed during the hospital encounter of 01/13/2022  Blood Culture ID Panel (Reflexed) (Collected: 01/16/2022  9:58 AM)  Result Value Ref Range   Enterococcus faecalis NOT DETECTED NOT DETECTED   Enterococcus Faecium NOT DETECTED NOT DETECTED   Listeria monocytogenes NOT DETECTED NOT DETECTED   Staphylococcus species NOT DETECTED NOT DETECTED   Staphylococcus aureus (BCID) NOT DETECTED NOT DETECTED   Staphylococcus epidermidis NOT DETECTED NOT DETECTED   Staphylococcus lugdunensis NOT DETECTED NOT DETECTED   Streptococcus species DETECTED (A) NOT DETECTED   Streptococcus agalactiae NOT DETECTED NOT DETECTED   Streptococcus pneumoniae DETECTED (A) NOT DETECTED   Streptococcus pyogenes NOT DETECTED NOT DETECTED   A.calcoaceticus-baumannii NOT DETECTED NOT DETECTED   Bacteroides fragilis NOT DETECTED NOT DETECTED   Enterobacterales NOT DETECTED NOT DETECTED   Enterobacter cloacae complex NOT DETECTED NOT DETECTED   Escherichia coli NOT DETECTED NOT DETECTED   Klebsiella aerogenes NOT DETECTED NOT DETECTED   Klebsiella oxytoca NOT DETECTED NOT DETECTED   Klebsiella pneumoniae NOT DETECTED NOT DETECTED   Proteus species NOT DETECTED NOT DETECTED   Salmonella species NOT DETECTED NOT DETECTED    Serratia marcescens NOT DETECTED NOT DETECTED   Haemophilus influenzae NOT DETECTED NOT DETECTED   Neisseria meningitidis NOT DETECTED NOT DETECTED   Pseudomonas aeruginosa NOT DETECTED NOT DETECTED   Stenotrophomonas maltophilia NOT DETECTED NOT DETECTED   Candida albicans NOT DETECTED NOT DETECTED   Candida auris NOT DETECTED NOT DETECTED   Candida glabrata NOT DETECTED NOT DETECTED   Candida krusei NOT DETECTED NOT DETECTED   Candida parapsilosis NOT DETECTED NOT DETECTED   Candida tropicalis NOT DETECTED NOT DETECTED   Cryptococcus neoformans/gattii NOT DETECTED NOT DETECTED    Wynona Neat, PharmD, BCPS  01/13/2022  4:40 AM

## 2022-01-13 NOTE — Progress Notes (Signed)
Initial Nutrition Assessment  DOCUMENTATION CODES:   Severe malnutrition in context of chronic illness  INTERVENTION:  Per chest x-ray 01/11/2022, side port of OGT is at GE junction and advancement by 5 cm was recommended to ensure side port is in the stomach. Discussed with RN.  Once OGT advanced and placement verified by abdominal x-ray, recommend initiating tube feeds via OGT: -Initiate Peptamen 1.5 at 20 mL/hour and advance by 10 mL/hour every 8 hours to goal rate of 50 mL/hour (1200 mL goal daily volume). -Provide PROSource TF20 60 mL once daily per tube. -Goal regimen provides 1880 kcal, 102 grams protein, 922 mL H2O daily.  Provide Banatrol TF 60 mL BID per tube in setting of diarrhea. Each pouch provides 45 kcal.  Provide Rena-vite QHS per tube.  Monitor magnesium, potassium, and phosphorus BID for at least 3 days, MD to replete as needed, as pt is at risk for refeeding syndrome.  NUTRITION DIAGNOSIS:   Severe Malnutrition related to chronic illness (ESRD on HD, COPD) as evidenced by severe fat depletion, moderate muscle depletion, severe muscle depletion.  GOAL:   Patient will meet greater than or equal to 90% of their needs  MONITOR:   Vent status, Labs, Weight trends, TF tolerance, Skin, I & O's  REASON FOR ASSESSMENT:   Ventilator, Consult Enteral/tube feeding initiation and management  ASSESSMENT:   88 year old male with PMHx of peptic ulcer disease, HTN, HLD, CAD, ESRD on HD MWF, COPD, dementia, hypothyroidism, DM who presented after being found unresponsive at home with septic shock and acute hypoxic respiratory failure secondary to aspiration PNA, acute metabolic encephalopathy. Intubated 01/06/2022.  No family members at bedside at time of RD assessment. Pt receiving HD in room at time of assessment. Spoke with patient's daughter Genevieve Norlander over the phone at [phone]. She reports patient had a good appetite and intake at baseline. He eats 3 meals per  day and eats 100% of his meals. He does not drink any ONS currently.  Daughter reports pt has not had any weight loss and is weight-stable. She does endorse he has difficulty gaining weight. EDW 54.5 kg per Nephrology note. Current wt of 57.6 kg is 3.1 kg above EDW, but per Nephrology note does not appear overloaded so plan for minimal to no UF. Weights in chart fluctuate between 50-57 kg since 2021.  Patient is currently intubated on ventilator support MV: 8.5 L/min Temp (24hrs), Avg:98.2 F (36.8 C), Min:97.9 F (36.6 C), Max:99 F (37.2 C)  Propofol: N/A - stopped this AM  Enteral Access: OGT placed 01/14/2022; cm marking not documented; tube courses into abdomen with side port near the expected level of GE junction per chest x-ray 01/20/2022 (recommendation to advance 5 cm to ensure side hole in the stomach)  UOP: 8 mL documented UOP yesterday  I/O: +1,780.9 mL since admission  Medications reviewed and include: Colace 100 mg BID, Banatrol TF 60 mL BID per tube, heparin, levothyroxine, pantoprazole, Miralax 17 grams daily, ceftriaxone.  Labs reviewed: CBG 55-152 past 24 hrs, Potassium 3.4, Chloride 97, BUN 46, Creatinine 5.59, Phosphorus 3.9, Magnesium 1.7.  Discussed with RN via secure chat regarding recommendation to advance OGT and obtain abdominal x-ray to verify placement prior to use.  NUTRITION - FOCUSED PHYSICAL EXAM:  Flowsheet Row Most Recent Value  Orbital Region Moderate depletion  Upper Arm Region Severe depletion  Thoracic and Lumbar Region Severe depletion  Buccal Region Unable to assess  Temple Region Severe depletion  Clavicle Bone Region Moderate  depletion  Clavicle and Acromion Bone Region Severe depletion  Scapular Bone Region Unable to assess  Dorsal Hand Moderate depletion  Patellar Region Severe depletion  Anterior Thigh Region Severe depletion  Posterior Calf Region Severe depletion  Edema (RD Assessment) None  Hair Reviewed  Eyes Unable to assess   Mouth Reviewed  Skin Reviewed  Nails Reviewed       Diet Order:   Diet Order             Diet NPO time specified  Diet effective now                   EDUCATION NEEDS:   Not appropriate for education at this time  Skin:  Skin Assessment: Skin Integrity Issues: Skin Integrity Issues:: Stage I Stage I: sacrum  Last BM:  01/13/22: large type 7  Height:   Ht Readings from Last 1 Encounters:  12/27/2021 '5\' 9"'$  (1.753 m)    Weight:   Wt Readings from Last 1 Encounters:  01/13/22 57.6 kg   Ideal Body Weight:  72.7 kg  BMI:  Body mass index is 18.75 kg/m.  Estimated Nutritional Needs:   Kcal:  2641-5830  Protein:  90-100 grams  Fluid:  UOP + 1 L  Javona Bergevin Magda Paganini, MS, RD, LDN, CNSC Pager number available on Amion

## 2022-01-13 NOTE — Procedures (Signed)
Patient was seen on dialysis and the procedure was supervised.  BFR 400  Via AVF BP is  111/58.   Patient appears to be tolerating treatment well  Louis Meckel 01/13/2022

## 2022-01-13 NOTE — Progress Notes (Signed)
NAME:  Jeffrey Campbell, MRN:  [id number], DOB:  02-19-1933, LOS: 1 ADMISSION DATE:  01/15/2022, CONSULTATION DATE:  01/24/2022 REFERRING MD:  EDP CHIEF COMPLAINT:  AMS, ARF   History of Present Illness:  88 year old male with extensive PMH including ESRD on HD MWF. Presents to ED on 10/17 after being found unresponsive at home. Per family patient recent admission 8/23 for altered mental status, UTI. Now with reported tiredness, weakness over last 24 hours. This morning when daughter arrived at patient home, she reports patient was unresponsive making gurgling sounds, on arrival to ED patient remains minimally responsive with tachypnea and hypotension requiring intubation. CXR with left lower lobe infiltrate. CT head negative. CT C/A/P with extensive bilateral infiltirates and noted left lower lobe infiltrate concerning for aspiration. Given Cefepime/Vancomycin. Critical Care Consulted for admission.  Pertinent  Medical History  HTN, DMII, CAD, CHB h/o PPM, ESRD on HD MWF, COPD,  Significant Hospital Events: Including procedures, antibiotic start and stop dates in addition to other pertinent events    Interim History / Subjective:  Follows commands. Can get head off the bed.  Review of Systems:   Negative unless stated in the subject.  Objective   Blood pressure (!) 121/59, pulse 60, temperature 98 F (36.7 C), temperature source Oral, resp. rate 15, height '5\' 9"'$  (1.753 m), weight 57.6 kg, SpO2 100 %.    Vent Mode: CPAP;PSV FiO2 (%):  [40 %-100 %] 40 % Set Rate:  [16 bmp] 16 bmp Vt Set:  [560 mL] 560 mL PEEP:  [5 cmH20] 5 cmH20 Pressure Support:  [12 cmH20] 12 cmH20 Plateau Pressure:  [16 cmH20-18 cmH20] 17 cmH20   Intake/Output Summary (Last 24 hours) at 01/13/2022 0757 Last data filed at 01/13/2022 0649 Gross per 24 hour  Intake 1788.94 ml  Output 8 ml  Net 1780.94 ml   Filed Weights   12/30/2021 1013 01/13/22 0332  Weight: 55.9 kg 57.6 kg   Examination: General: NAD HENT:  Intubated. NCAT Lungs: CTAB Cardiovascular: NSR, diastolic murmur present Abdomen: No TTP, soft Extremities: No asymmetery Neuro: follows commands GU: foley present Consults   Resolved Hospital Problem list    Assessment & Plan:  Septic Shock 2/2 Aspiration Pneumonia Acute hypoxic Respiratory Failure 2/2 to Aspiration Pneumonia Acute Metabolic encephalopathy 2/2 problem 1 and ESRD Septic shock resolved. Pt on pressor support and plan was for extubation today. Pt mental status appears to be waxing and waning which may be due to underlying dementia. Pt blood cultures showing gram positive cocci and BCID showing strep pneumonia. Currently on abx. -Continue Rocephin and Vanc.  -Continue Abx -Continue to follow cultures -Continue Vent support  ESRD on HD MWF Hypokalemia Anemia of Chronic Disease Nephrology consulted. Will replete electrolytes as needed. Hgb stable at 9.2. K at 3.4. Pt appears euvolemic.   Elevated troponins 2/2 to shock vs MI Hx of CHB s/p pacemaker Tropon 4,963. Echo with global LV hypokinesis. GIIIDD. Ejection fraction mildly reduced from normal. Suspect secondary to septic shock but will get cardiology involved given cardiac hx.  -Cardiology consulted  Dementia Chronic. Pt on donepezil 10 mg qhs. Dementia could be causing some delirium and complicating pt status.   Hypothyroidism Chronic. TSH elevated at 5.8. Will get FT4 and total T3. Pt on levothyroxine 150 mcg daily.   DMII Continue SSI. Goal 140-180. CBG 77.  Best Practice (right click and "Reselect all SmartList Selections" daily)  Diet/type: NPO DVT prophylaxis: prophylactic heparin  GI prophylaxis: PPI Lines: N/A Foley:  Yes, and it is still needed Continuous: None  Code Status:  full code Last date of multidisciplinary goals of care discussion [Plan for 01/13/22] Labs   CBC: Recent Labs  Lab 12/31/2021 1012 01/13/2022 1029 01/06/2022 1137 12/27/2021 1146 01/13/22 0604  WBC 7.0  --   --   --  7.9   NEUTROABS 5.6  --   --   --   --   HGB 9.6* 9.5* 9.9* 9.9* 9.2*  HCT 30.5* 28.0* 29.0* 29.0* 28.0*  MCV 103.7*  --   --   --  98.9  PLT 94*  --   --   --  75*   Basic Metabolic Panel: Recent Labs  Lab 01/24/2022 1012 12/28/2021 1029 01/11/2022 1137 01/19/2022 1146 01/26/2022 1453 01/13/22 0604  NA 139 138 138 139  --  138  K 3.3* 3.3* 3.3* 3.3*  --  3.4*  CL 98 96*  --   --   --  97*  CO2 29  --   --   --   --  24  GLUCOSE 67* 68*  --   --   --  66*  BUN 30* 29*  --   --   --  46*  CREATININE 4.66* 4.80*  --   --   --  5.59*  CALCIUM 8.5*  --   --   --   --  8.9  MG  --   --   --   --  1.5* 1.7  PHOS  --   --   --   --  3.8 3.9   GFR: Estimated Creatinine Clearance: 7.7 mL/min (A) (by C-G formula based on SCr of 5.59 mg/dL (H)). Recent Labs  Lab 12/30/2021 1012 01/14/2022 1317 01/13/22 0604  WBC 7.0  --  7.9  LATICACIDVEN 3.4* 3.8*  --    Liver Function Tests: Recent Labs  Lab 01/02/2022 1012  AST 59*  ALT 33  ALKPHOS 53  BILITOT 1.2  PROT 5.6*  ALBUMIN 2.8*   No results for input(s): "LIPASE", "AMYLASE" in the last 168 hours. No results for input(s): "AMMONIA" in the last 168 hours. ABG    Component Value Date/Time   PHART 7.445 01/07/2022 1146   PCO2ART 43.4 01/07/2022 1146   PO2ART 240 (H) 01/06/2022 1146   HCO3 29.4 (H) 01/24/2022 1146   TCO2 31 12/31/2021 1146   ACIDBASEDEF 5.0 (H) 07/28/2016 0850   ACIDBASEDEF 5.0 (H) 07/28/2016 0850   O2SAT 100 01/14/2022 1146    Coagulation Profile: Recent Labs  Lab 01/20/2022 1012  INR 1.5*   Cardiac Enzymes: No results for input(s): "CKTOTAL", "CKMB", "CKMBINDEX", "TROPONINI" in the last 168 hours. HbA1C: Hgb A1c MFr Bld  Date/Time Value Ref Range Status  01/02/2022 01:17 PM 5.2 4.8 - 5.6 % Final    Comment:    (NOTE) Pre diabetes:          5.7%-6.4%  Diabetes:              >6.4%  Glycemic control for   <7.0% adults with diabetes   09/19/2016 04:07 AM 5.5 4.8 - 5.6 % Final    Comment:    (NOTE)          Pre-diabetes: 5.7 - 6.4         Diabetes: >6.4         Glycemic control for adults with diabetes: <7.0    CBG: Recent Labs  Lab 12/27/2021 1523 01/10/2022 1913 01/22/2022 2317 01/13/22 0317 01/13/22 3546  GLUCAP 152* 101* 81 77 92   Past Medical History:  He,  has a past medical history of Anemia, BPH (benign prostatic hyperplasia), CAD (coronary artery disease), Carotid artery stenosis, CKD (chronic kidney disease), stage III ([place]), Diastolic dysfunction, Glaucoma, Graves disease, History of ETOH abuse, Hyperlipemia, Hypertension, Hyperthyroidism (08/26/10), Memory loss, Mitral regurgitation (echo 2015), Multiple thyroid nodules, MVP (mitral valve prolapse) (11/2012), OSA (obstructive sleep apnea), Pre-diabetes, PUD (peptic ulcer disease), Pulmonary hypertension ([place]) (echo 2015), and Upper airway resistance syndrome.  Surgical History:   Past Surgical History:  Procedure Laterality Date   APPENDECTOMY     AV FISTULA PLACEMENT Left 10/18/2017   Procedure: ARTERIOVENOUS (AV) FISTULA CREATION ARM;  Surgeon: Waynetta Sandy, MD;  Location: [place];  Service: Vascular;  Laterality: Left;   CARDIAC CATHETERIZATION     CARDIAC CATHETERIZATION N/A 02/17/2015   Procedure: Right Heart Cath;  Surgeon: Larey Dresser, MD;  Location: [place] [facility];  Service: Cardiovascular;  Laterality: N/A;   CARDIOVERSION N/A 02/08/2019   Procedure: CARDIOVERSION;  Surgeon: Donato Heinz, MD;  Location: [place];  Service: Endoscopy;  Laterality: N/A;   ESOPHAGOGASTRODUODENOSCOPY (EGD) WITH PROPOFOL Left 10/16/2016   Procedure: ESOPHAGOGASTRODUODENOSCOPY (EGD) WITH PROPOFOL;  Surgeon: Ronnette Juniper, MD;  Location: [facility];  Service: Gastroenterology;  Laterality: Left;   heart catherization     HERNIA REPAIR  10/2009   IR RADIOLOGIST EVAL & MGMT  09/28/2016   IR RADIOLOGIST EVAL & MGMT  10/19/2016   IR RADIOLOGIST EVAL & MGMT  01/18/2017   PACEMAKER IMPLANT N/A 02/09/2019   Procedure:  PACEMAKER IMPLANT;  Surgeon: Constance Haw, MD;  Location: [place] [facility];  Service: Cardiovascular;  Laterality: N/A;   RIGHT HEART CATH N/A 07/28/2016   Procedure: Right Heart Cath;  Surgeon: Larey Dresser, MD;  Location: [place];  Service: Cardiovascular;  Laterality: N/A;    Social History:   reports that he quit smoking about 37 years ago. His smoking use included cigarettes. He has a 80.00 pack-year smoking history. He has never used smokeless tobacco. He reports that he does not drink alcohol and does not use drugs.  Family History:  His family history includes Colon cancer in his brother, maternal uncle, and mother; Hypertension in his father; Kidney failure in his father; Lung disease in his brother.  Allergies Allergies  Allergen Reactions   Cardura [Doxazosin] Shortness Of Breath and Other (See Comments)    Dizziness    Hydrochlorothiazide Shortness Of Breath and Other (See Comments)    Dizziness    Iodinated Contrast Media Other (See Comments)    "shut down his kidneys" Patient has stage III chronic kidney disease   Zocor [Simvastatin] Other (See Comments)    Kidney problems    Protonix [Pantoprazole] Other (See Comments)    Worsening kidney problems   Rocaltrol [Calcitriol] Other (See Comments)    Unknown reaction   Budesonide-Formoterol Fumarate Other (See Comments)    Dry mouth   Minodyl [Minoxidil] Other (See Comments)    Unknown raction   Tiotropium Bromide Monohydrate Other (See Comments)    Dry mouth   Tricor [Fenofibrate]     Unknown reaction    Home Medications  Prior to Admission medications   Medication Sig Start Date End Date Taking? Authorizing Provider  atorvastatin (LIPITOR) 20 MG tablet TAKE 1 BY MOUTH DAILY ABSOLUTE LAST REFILL WITHOUT OFFICE VISIT PLEASE CALL [phone] TO SCHEDULE Patient taking differently: Take 20 mg by mouth daily. 11/09/21  Yes Larey Dresser, MD  Brinzolamide-Brimonidine [facility]) 1-0.2 % SUSP Place  1 drop into both eyes 2 (two) times daily.    Yes [provider]  calcium acetate (PHOSLO) 667 MG capsule Take 667 mg by mouth See admin instructions. 667 mg 3 times daily with meals, 667 mg 2 times daily with snacks. 11/30/19  Yes [provider]  donepezil (ARICEPT) 10 MG tablet Take 1 tablet (10 mg total) by mouth at bedtime. 08/17/21  Yes Suzzanne Cloud, NP  doxazosin (CARDURA) 4 MG tablet Take 1 tablet (4 mg total) by mouth daily. Patient taking differently: Take 4 mg by mouth 2 (two) times daily. 02/12/19  Yes Simmons, Brittainy M, PA-C  levothyroxine (SYNTHROID, LEVOTHROID) 150 MCG tablet Take 150 mcg by mouth daily before breakfast.   Yes [provider]  memantine (NAMENDA) 10 MG tablet TAKE 1 TABLET BY MOUTH TWICE A DAY Patient taking differently: Take 10 mg by mouth 2 (two) times daily. 01/19/21  Yes Suzzanne Cloud, NP  multivitamin (RENA-VIT) TABS tablet Take 1 tablet by mouth daily. 11/01/19  Yes [provider]     Idamae Schuller, MD [facility]. [facility] Residency, PGY-2

## 2022-01-13 NOTE — Progress Notes (Signed)
Patient response to voice stimulant Informed consent signed and in chart.   Treatment initiated: 1400 Treatment completed: 2158  Patient tolerated well. Dressing c/d/I.  Hand-off given to patient's nurse.   Access used: AVF  Access issues: none  Total UF removed: 500 Medication(s) given: vancomycin, heparin bolus Post HD VS: 97.8, 65, 20, 123/66, 97% vent Post HD weight: 61.1 kg    [facility]

## 2022-01-13 NOTE — Progress Notes (Addendum)
Pharmacy Antibiotic Note  Jeffrey Campbell is a 88 y.o. male admitted on 12/27/2021 with pneumonia. Patient with strep pneumoniae bacteremia on ceftriaxone. Pharmacy has been consulted for vancomycin dosing while awaiting sensitivities. Patient is ESRD on iHD MWF.   Plan: Continue ceftriaxone per MD  Vancomycin '500mg'$  with iHD on MWF Follow up plans for iHD today and if planning iHD will add vancomycin '500mg'$  x1  Monitor iHD schedule, cultures, and clinical progression  Height: '5\' 9"'$  (175.3 cm) Weight: 57.6 kg (126 lb 15.8 oz) IBW/kg (Calculated) : 70.7  Temp (24hrs), Avg:98.8 F (37.1 C), Min:97.9 F (36.6 C), Max:99.6 F (37.6 C)  Recent Labs  Lab 01/26/2022 1012 01/26/2022 1029 01/14/2022 1317 01/13/22 0604  WBC 7.0  --   --  7.9  CREATININE 4.66* 4.80*  --  5.59*  LATICACIDVEN 3.4*  --  3.8*  --     Estimated Creatinine Clearance: 7.7 mL/min (A) (by C-G formula based on SCr of 5.59 mg/dL (H)).    Allergies  Allergen Reactions   Cardura [Doxazosin] Shortness Of Breath and Other (See Comments)    Dizziness    Hydrochlorothiazide Shortness Of Breath and Other (See Comments)    Dizziness    Iodinated Contrast Media Other (See Comments)    "shut down his kidneys" Patient has stage III chronic kidney disease   Zocor [Simvastatin] Other (See Comments)    Kidney problems    Protonix [Pantoprazole] Other (See Comments)    Worsening kidney problems   Rocaltrol [Calcitriol] Other (See Comments)    Unknown reaction   Budesonide-Formoterol Fumarate Other (See Comments)    Dry mouth   Minodyl [Minoxidil] Other (See Comments)    Unknown raction   Tiotropium Bromide Monohydrate Other (See Comments)    Dry mouth   Tricor [Fenofibrate]     Unknown reaction    Antimicrobials this admission: Cefepime x1 10/17 Ceftriaxone 10/18 >>  Vancomycin 10/17 >>   Dose adjustments this admission:   Microbiology results: 10//17 bcx: GPC in pairs; BCID strep pneumoniae  Thank you for  allowing pharmacy to be a part of this patient's care.  Cristela Felt, PharmD, BCPS Clinical Pharmacist 01/13/2022 12:45 PM   Addendum: Patient getting iHD today at bedside.   Plan:  - Adjust vancomycin '500mg'$  with iHD MWF to include today  Cristela Felt, PharmD, BCPS Clinical Pharmacist 01/13/2022 1:27 PM

## 2022-01-13 NOTE — Consult Note (Addendum)
Cardiology Consultation   Patient ID: Jeffrey Campbell MRN: [id number]; DOB: Dec 30, 1932  Admit date: 01/19/2022 Date of Consult: 01/13/2022  PCP:  Lawerance Cruel, [facility] Cardiologist:  Fransico Him, MD   { Patient Profile:   Jeffrey Campbell is a 88 y.o. male with a hx of coronary artery disease, ESRD on HD, PVD, pulmonary hypertension, COPD, diastolic heart failure, dementia, HTN, OSA, HLD and persistent atrial flutter who is being seen 01/13/2022 for the evaluation of Elevated troponin at the request of Dr. Tacy Learn.  [place] 2012 with 50-60% ostial LAD, 50-60%mLAD, 60% pRCA.  [facility] 07/2016 Moderate pulmonary hypertension.  PVR 1.74 WU, so suspect primarily pulmonary venous hypertension and possible component of PH from OSA. Stress test 06/2014: low risk   He presented for DCCV 01/2019, HR was around 40 in atrial flutter initially.  After DCCV, he was in CHB with escape rate upper 30s. He was admitted to ICU. Zoll pads placed. He remained hemodynamically stable and hospital course was actually c/b hypertension, required treatment w/ IV Clevidipine. EP consulted for CHB. He underwent PPM implant, Product manager.   Hx of PAD with renal artery stenosis, mesenteric artery stenosis, distal aortic stenosis.  Last seen in afib clinic 07/2019 - " do not see that  aggressive measures are needed at this point to try to restore  SR in the setting of advanced dementia and in palliative care. I explained this to the patient's daughter and she is in agreement ".   History of Present Illness:   Jeffrey Campbell to ER yesterday after being found unresponsive.  History obtained from chart review.  No family member at bedside.  Daughter found patient unresponsive making gurgling sound.  Reported to have weakness for last 24 hours.  He was found hypoxic with tachypnea and hypotension in ER requiring intubation.  Chest x-ray with left lower lobe infiltrates.  CT of chest concerning for  aspiration pneumonia.  Started on broad-spectrum antibiotic.  Blood cultures showing bacteremia.  Mg 1.5 Hs-troponin 2957>>[id number] Echo showed mildly reduced LV function at 45 to 50% from 55 to 60% in 2020.  Moderate LVH with grade 2 diastolic dysfunction.  Elevated LVEDP.  Moderately reduced RV function.  RVSP 45.9 mmHg (RVSP 72 mmHg in 2020).  Severely dilated bilateral atria.  Cardiology is asked for further evaluation given elevated troponin.   Past Medical History:  Diagnosis Date   Anemia    low iron   BPH (benign prostatic hyperplasia)    CAD (coronary artery disease)    Carotid artery stenosis    1-39% bilateral carotid artery stenosis and < 50% stenosis in the right CCA by dopplers 06/2017   CKD (chronic kidney disease), stage III (HCC)    Stage 4   Diastolic dysfunction    Glaucoma    Graves disease    History of ETOH abuse    Hyperlipemia    Hypertension    Hyperthyroidism 08/26/10   radioactive iodine therapy    Memory loss    Mitral regurgitation echo 2015   mild   Multiple thyroid nodules    MVP (mitral valve prolapse) 11/2012   posterior MVP   OSA (obstructive sleep apnea)    upper airway resistance syndrome with RDI 18/hr - not on CPAP due to insurance not covering   Pre-diabetes    PUD (peptic ulcer disease)    Pulmonary hypertension ([place]) echo 2015   Group 2 with pulmonary venous HTN and  Group 3 with OSA   Upper airway resistance syndrome     Past Surgical History:  Procedure Laterality Date   APPENDECTOMY     AV FISTULA PLACEMENT Left 10/18/2017   Procedure: ARTERIOVENOUS (AV) FISTULA CREATION ARM;  Surgeon: Waynetta Sandy, MD;  Location: [place];  Service: Vascular;  Laterality: Left;   CARDIAC CATHETERIZATION     CARDIAC CATHETERIZATION N/A 02/17/2015   Procedure: Right Heart Cath;  Surgeon: Larey Dresser, MD;  Location: [place] [facility];  Service: Cardiovascular;  Laterality: N/A;   CARDIOVERSION N/A 02/08/2019   Procedure: CARDIOVERSION;   Surgeon: Donato Heinz, MD;  Location: [place];  Service: Endoscopy;  Laterality: N/A;   ESOPHAGOGASTRODUODENOSCOPY (EGD) WITH PROPOFOL Left 10/16/2016   Procedure: ESOPHAGOGASTRODUODENOSCOPY (EGD) WITH PROPOFOL;  Surgeon: Ronnette Juniper, MD;  Location: [place];  Service: Gastroenterology;  Laterality: Left;   heart catherization     HERNIA REPAIR  10/2009   IR RADIOLOGIST EVAL & MGMT  09/28/2016   IR RADIOLOGIST EVAL & MGMT  10/19/2016   IR RADIOLOGIST EVAL & MGMT  01/18/2017   PACEMAKER IMPLANT N/A 02/09/2019   Procedure: PACEMAKER IMPLANT;  Surgeon: Constance Haw, MD;  Location: [place] [facility];  Service: Cardiovascular;  Laterality: N/A;   RIGHT HEART CATH N/A 07/28/2016   Procedure: Right Heart Cath;  Surgeon: Larey Dresser, MD;  Location: [place] [facility];  Service: Cardiovascular;  Laterality: N/A;    Inpatient Medications: Scheduled Meds:  atorvastatin  20 mg Per Tube Daily   Chlorhexidine Gluconate Cloth  6 each Topical Daily   Chlorhexidine Gluconate Cloth  6 each Topical Q0600   docusate  100 mg Per Tube BID   donepezil  10 mg Per Tube QHS   fiber supplement (BANATROL TF)  60 mL Per Tube BID   heparin  5,000 Units Subcutaneous Q8H   heparin sodium (porcine)       levothyroxine  150 mcg Per Tube Q0600   mouth rinse  15 mL Mouth Rinse Q2H   pantoprazole  40 mg Per Tube Daily   polyethylene glycol  17 g Per Tube Daily   Continuous Infusions:  cefTRIAXone (ROCEPHIN)  IV 2 g (01/13/22 1048)   propofol (DIPRIVAN) infusion Stopped (01/13/22 0730)   vancomycin     PRN Meds: docusate, fentaNYL (SUBLIMAZE) injection, heparin sodium (porcine), mouth rinse, polyethylene glycol  Allergies:    Allergies  Allergen Reactions   Cardura [Doxazosin] Shortness Of Breath and Other (See Comments)    Dizziness    Hydrochlorothiazide Shortness Of Breath and Other (See Comments)    Dizziness    Iodinated Contrast Media Other (See Comments)    "shut down his  kidneys" Patient has stage III chronic kidney disease   Zocor [Simvastatin] Other (See Comments)    Kidney problems    Protonix [Pantoprazole] Other (See Comments)    Worsening kidney problems   Rocaltrol [Calcitriol] Other (See Comments)    Unknown reaction   Budesonide-Formoterol Fumarate Other (See Comments)    Dry mouth   Minodyl [Minoxidil] Other (See Comments)    Unknown raction   Tiotropium Bromide Monohydrate Other (See Comments)    Dry mouth   Tricor [Fenofibrate]     Unknown reaction    Social History:   Social History   Socioeconomic History   Marital status: Married    Spouse name: Not on file   Number of children: 4   Years of education: completed high school   Highest  education level: Not on file  Occupational History   Occupation: Retired    Fish farm manager: RETIRED    Comment: [place] [employer]  Tobacco Use   Smoking status: Former    Packs/day: 2.00    Years: 40.00    Total pack years: 80.00    Types: Cigarettes    Quit date: 03/29/1984    Years since quitting: 37.8   Smokeless tobacco: Never  Vaping Use   Vaping Use: Not on file  Substance and Sexual Activity   Alcohol use: No    Comment: quit in 1981   Drug use: No   Sexual activity: Not on file  Other Topics Concern   Not on file  Social History Narrative   Lives at home with his wife.   Right-handed.   Occasional use of caffeine.   Social Determinants of Health   Financial Resource Strain: Not on file  Food Insecurity: Not on file  Transportation Needs: No Transportation Needs (11/19/2021)   PRAPARE - Hydrologist (Medical): No    Lack of Transportation (Non-Medical): No  Physical Activity: Not on file  Stress: Not on file  Social Connections: Not on file  Intimate Partner Violence: Not on file    Family History:   Family History  Problem Relation Age of Onset   Kidney failure Father    Hypertension Father    Colon cancer Mother    Colon cancer Brother     Lung disease Brother    Colon cancer Maternal Uncle      ROS:  Please see the history of present illness.  All other ROS reviewed and negative.     Physical Exam/Data:   Vitals:   01/13/22 0800 01/13/22 0809 01/13/22 1113 01/13/22 1121  BP: 115/64     Pulse: 60   60  Resp: 14   16  Temp:   98.2 F (36.8 C)   TempSrc:   Axillary   SpO2: 100% 100%  99%  Weight:      Height:        Intake/Output Summary (Last 24 hours) at 01/13/2022 1354 Last data filed at 01/13/2022 0800 Gross per 24 hour  Intake 1437.04 ml  Output 8 ml  Net 1429.04 ml      01/13/2022    3:32 AM 01/21/2022   10:13 AM 11/18/2021   12:53 PM  Last 3 Weights  Weight (lbs) 126 lb 15.8 oz 123 lb 3.8 oz 119 lb 0.8 oz  Weight (kg) 57.6 kg 55.9 kg 54 kg     Body mass index is 18.75 kg/m.  General: Ill-appearing intubated elderly male  HEENT: normal Neck: no JVD Vascular: No carotid bruits; Distal pulses 2+ bilaterally Cardiac:  normal S1, S2; irregular; no murmur  Lungs:  clear to auscultation bilaterally Abd: soft, nontender, no hepatomegaly  Ext: no edema Musculoskeletal:  No deformities, BUE and BLE strength normal and equal Skin: warm and dry  Neuro:  , no focal abnormalities noted Psych:  Intubated  EKG:  The EKG was personally reviewed and demonstrates: AV paced rhythm Telemetry:  Telemetry was personally reviewed and demonstrates:  AV paced   Relevant CV Studies:  Echo 01/01/2022 1. Left ventricular ejection fraction, by estimation, is 45 to 50%. Left  ventricular ejection fraction by 2D MOD biplane is 46.3 %. The left  ventricle has mildly decreased function. The left ventricle demonstrates  global hypokinesis. There is moderate  left ventricular hypertrophy. Left ventricular diastolic parameters are  consistent with Grade III diastolic dysfunction (restrictive). Elevated  left ventricular end-diastolic pressure.   2. Right ventricular systolic function is moderately reduced. The right   ventricular size is mildly enlarged. There is moderately elevated  pulmonary artery systolic pressure. The estimated right ventricular  systolic pressure is 78.9 mmHg.   3. Left atrial size was severely dilated.   4. Right atrial size was severely dilated.   5. The mitral valve is abnormal. Mild mitral valve regurgitation.   6. The tricuspid valve is abnormal. Tricuspid valve regurgitation is mild  to moderate.   7. The aortic valve is tricuspid. Aortic valve regurgitation is trivial.  Aortic valve sclerosis is present, with no evidence of aortic valve  stenosis.   8. Mildly dilated pulmonary artery.   9. The inferior vena cava is dilated in size with <50% respiratory  variability, suggesting right atrial pressure of 15 mmHg.   Comparison(s): Changes from prior study are noted. 01/05/2019: LVEF 55-60%.   Echo 12/2018  1. Patient appears to be in atrial flutter with slow ventricular rate,  with pronounced fluttering of mitral valve seen on short axis and m mode.   2. Left ventricular ejection fraction, by visual estimation, is 55 to  60%. The left ventricle has normal function. Mildly increased left  ventricular size. There is mildly increased left ventricular hypertrophy.   3. Left ventricular diastolic Doppler parameters are indeterminate  pattern of LV diastolic filling.   4. Global right ventricle has moderately reduced systolic function.The  right ventricular size is moderately enlarged. Mildly increased right  ventricular wall thickness.   5. Left atrial size was severely dilated.   6. Right atrial size was severely dilated.   7. Small pericardial effusion.   8. Moderate calcification of the mitral valve leaflet(s).   9. Moderate thickening of the mitral valve leaflet(s).  10. The mitral valve is degenerative. Trace mitral valve regurgitation.  11. The tricuspid valve is normal in structure. Tricuspid valve  regurgitation is mild.  12. The aortic valve is tricuspid Aortic  valve regurgitation is trivial by  color flow Doppler. Mild aortic valve sclerosis without stenosis.  13. The pulmonic valve was normal in structure. Pulmonic valve  regurgitation is trivial by color flow Doppler.  14. Severely elevated pulmonary artery systolic pressure.  15. The inferior vena cava is dilated in size with <50% respiratory  variability, suggesting right atrial pressure of 15 mmHg.  16. Severe pulmonary hypertension, RVSP 72 mmHg.    Right Heart Cath  07/2016   Conclusion  1. Mildly elevated right and left heart filling pressures.  2. Preserved cardiac output.  3. Moderate pulmonary hypertension.  PVR 1.74 WU, so suspect primarily pulmonary venous hypertension and possible component of PH from OSA.   Would not treat with selective pulmonary vasodilators.  Likely best treatment remains diuresis and CPAP.   Laboratory Data:  High Sensitivity Troponin:   Recent Labs  Lab 01/23/2022 1317 01/07/2022 1453  TROPONINIHS 2,957* 4,963*     Chemistry Recent Labs  Lab 01/23/2022 1012 01/09/2022 1029 01/22/2022 1137 01/20/2022 1146 12/30/2021 1453 01/13/22 0604  NA 139 138 138 139  --  138  K 3.3* 3.3* 3.3* 3.3*  --  3.4*  CL 98 96*  --   --   --  97*  CO2 29  --   --   --   --  24  GLUCOSE 67* 68*  --   --   --  66*  BUN 30* 29*  --   --   --  46*  CREATININE 4.66* 4.80*  --   --   --  5.59*  CALCIUM 8.5*  --   --   --   --  8.9  MG  --   --   --   --  1.5* 1.7  GFRNONAA 12*  --   --   --   --  9*  ANIONGAP 12  --   --   --   --  17*    Recent Labs  Lab 01/15/2022 1012  PROT 5.6*  ALBUMIN 2.8*  AST 59*  ALT 33  ALKPHOS 53  BILITOT 1.2   Lipids  Recent Labs  Lab 01/13/22 0604  TRIG 111    Hematology Recent Labs  Lab 01/13/2022 1012 01/20/2022 1029 01/20/2022 1137 01/04/2022 1146 01/13/22 0604  WBC 7.0  --   --   --  7.9  RBC 2.94*  --   --   --  2.83*  HGB 9.6*   < > 9.9* 9.9* 9.2*  HCT 30.5*   < > 29.0* 29.0* 28.0*  MCV 103.7*  --   --   --  98.9  MCH 32.7  --    --   --  32.5  MCHC 31.5  --   --   --  32.9  RDW 14.9  --   --   --  15.0  PLT 94*  --   --   --  75*   < > = values in this interval not displayed.   Thyroid  Recent Labs  Lab 01/18/2022 1012  TSH 5.816*    BNP Recent Labs  Lab 01/10/2022 1012  BNP 1,904.9*    DDimer No results for input(s): "DDIMER" in the last 168 hours.   Radiology/Studies:  ECHOCARDIOGRAM COMPLETE  Result Date: 01/25/2022    ECHOCARDIOGRAM REPORT   Patient Name:   Jeffrey Campbell Date of Exam: 01/05/2022 Medical Rec #:  [id number]     Height:       69.0 in Accession #:    [id number]    Weight:       123.2 lb Date of Birth:  July 21, 1932     BSA:          1.682 m Patient Age:    72 years      BP:           95/65 mmHg Patient Gender: M             HR:           60 bpm. Exam Location:  Inpatient Procedure: 2D Echo, Color Doppler and Cardiac Doppler Indications:    R06.9 DOE  History:        Patient has prior history of Echocardiogram examinations, most                 recent 01/05/2019. CHF, CAD, COPD; Risk Factors:Hypertension,                 Diabetes, Dyslipidemia, Sleep Apnea and Former Smoker.  Sonographer:    Raquel Sarna Senior RDCS Referring Phys: [id number] Omar Person  Sonographer Comments: Scanned supine on artificial respirator. IMPRESSIONS  1. Left ventricular ejection fraction, by estimation, is 45 to 50%. Left ventricular ejection fraction by 2D MOD biplane is 46.3 %. The left ventricle has mildly decreased function. The left ventricle demonstrates global hypokinesis. There is moderate left ventricular hypertrophy. Left ventricular diastolic parameters are consistent with Grade III diastolic dysfunction (restrictive). Elevated left ventricular end-diastolic  pressure.  2. Right ventricular systolic function is moderately reduced. The right ventricular size is mildly enlarged. There is moderately elevated pulmonary artery systolic pressure. The estimated right ventricular systolic pressure is 42.5 mmHg.  3. Left atrial size  was severely dilated.  4. Right atrial size was severely dilated.  5. The mitral valve is abnormal. Mild mitral valve regurgitation.  6. The tricuspid valve is abnormal. Tricuspid valve regurgitation is mild to moderate.  7. The aortic valve is tricuspid. Aortic valve regurgitation is trivial. Aortic valve sclerosis is present, with no evidence of aortic valve stenosis.  8. Mildly dilated pulmonary artery.  9. The inferior vena cava is dilated in size with <50% respiratory variability, suggesting right atrial pressure of 15 mmHg. Comparison(s): Changes from prior study are noted. 01/05/2019: LVEF 55-60%. FINDINGS  Left Ventricle: Left ventricular ejection fraction, by estimation, is 45 to 50%. Left ventricular ejection fraction by 2D MOD biplane is 46.3 %. The left ventricle has mildly decreased function. The left ventricle demonstrates global hypokinesis. The left ventricular internal cavity size was normal in size. There is moderate left ventricular hypertrophy. Abnormal (paradoxical) septal motion, consistent with left bundle branch block. Left ventricular diastolic parameters are consistent with Grade III diastolic dysfunction (restrictive). Elevated left ventricular end-diastolic pressure. Right Ventricle: The right ventricular size is mildly enlarged. No increase in right ventricular wall thickness. Right ventricular systolic function is moderately reduced. There is moderately elevated pulmonary artery systolic pressure. The tricuspid regurgitant velocity is 2.78 m/s, and with an assumed right atrial pressure of 15 mmHg, the estimated right ventricular systolic pressure is 95.6 mmHg. Left Atrium: Left atrial size was severely dilated. Right Atrium: Right atrial size was severely dilated. Pericardium: There is no evidence of pericardial effusion. Mitral Valve: The mitral valve is abnormal. There is mild thickening of the anterior and posterior mitral valve leaflet(s). Mild mitral valve regurgitation. Tricuspid  Valve: The tricuspid valve is abnormal. Tricuspid valve regurgitation is mild to moderate. Aortic Valve: The aortic valve is tricuspid. Aortic valve regurgitation is trivial. Aortic valve sclerosis is present, with no evidence of aortic valve stenosis. Pulmonic Valve: The pulmonic valve was grossly normal. Pulmonic valve regurgitation is trivial. Aorta: The aortic root and ascending aorta are structurally normal, with no evidence of dilitation. Pulmonary Artery: The pulmonary artery is mildly dilated. Venous: The inferior vena cava is dilated in size with less than 50% respiratory variability, suggesting right atrial pressure of 15 mmHg. IAS/Shunts: No atrial level shunt detected by color flow Doppler. Additional Comments: There is pleural effusion in the left lateral region.  LEFT VENTRICLE PLAX 2D                        Biplane EF (MOD) LVIDd:         3.50 cm         LV Biplane EF:   Left LVIDs:         2.60 cm                          ventricular LV PW:         1.60 cm                          ejection LV IVS:        1.30 cm  fraction by LVOT diam:     2.00 cm                          2D MOD LV SV:         62                               biplane is LV SV Index:   37                               46.3 %. LVOT Area:     3.14 cm                                Diastology                                LV e' medial:    6.96 cm/s LV Volumes (MOD)               LV E/e' medial:  14.2 LV vol d, MOD    110.0 ml      LV e' lateral:   9.14 cm/s A2C:                           LV E/e' lateral: 10.8 LV vol d, MOD    125.0 ml A4C: LV vol s, MOD    56.6 ml A2C: LV vol s, MOD    73.5 ml A4C: LV SV MOD A2C:   53.4 ml LV SV MOD A4C:   125.0 ml LV SV MOD BP:    53.4 ml RIGHT VENTRICLE RV S prime:     6.96 cm/s TAPSE (M-mode): 1.1 cm LEFT ATRIUM              Index        RIGHT ATRIUM           Index LA diam:        3.90 cm  2.32 cm/m   RA Area:     31.00 cm LA Vol (A2C):   137.0 ml 81.46 ml/m  RA Volume:    115.00 ml 68.38 ml/m LA Vol (A4C):   82.1 ml  48.82 ml/m LA Biplane Vol: 111.0 ml 66.00 ml/m  AORTIC VALVE LVOT Vmax:   110.00 cm/s LVOT Vmean:  77.900 cm/s LVOT VTI:    0.196 m  AORTA Ao Root diam: 3.30 cm Ao Asc diam:  2.90 cm MITRAL VALVE               TRICUSPID VALVE MV Area (PHT): 3.37 cm    TR Peak grad:   30.9 mmHg MV Decel Time: 225 msec    TR Vmax:        278.00 cm/s MV E velocity: 98.50 cm/s MV A velocity: 43.90 cm/s  SHUNTS MV E/A ratio:  2.24        Systemic VTI:  0.20 m                            Systemic Diam: 2.00 cm Lyman Bishop MD Electronically signed by Lyman Bishop MD Signature Date/Time: 01/13/2022/6:17:08 PM    Final  EEG adult  Result Date: 12/30/2021 Lora Havens, MD     01/13/2022 10:31 AM Patient Name: Jeffrey Campbell MRN: [id number] Epilepsy Attending: Lora Havens Referring Physician/Provider: Kipp Brood, MD Date: 01/14/2022 Duration: 22.24 mins Patient history: 88 year old male with altered mental status.  EEG to evaluate for seizure. Level of alertness: Awake AEDs during EEG study: None Technical aspects: This EEG study was done with scalp electrodes positioned according to the 10-20 International system of electrode placement. Electrical activity was reviewed with band pass filter of 1-'70Hz'$ , sensitivity of 7 uV/mm, display speed of 24m/sec with a '60Hz'$  notched filter applied as appropriate. EEG data were recorded continuously and digitally stored.  Video monitoring was available and reviewed as appropriate. Description: No clear posterior dominant rhythm was seen. EEG showed continuous generalized predominantly 5 to 6 Hz theta slowing admixed with intermittent generalized 2-'3Hz'$  delta slowing.  Hyperventilation and photic stimulation were not performed.   ABNORMALITY - Continuous slow, generalized IMPRESSION: This study is suggestive of moderate diffuse encephalopathy, nonspecific etiology. No seizures or epileptiform discharges were seen throughout the recording.  PLora Havens  CT CHEST ABDOMEN PELVIS W CONTRAST  Result Date: 01/04/2022 CLINICAL DATA:  Sepsis.  Found down. EXAM: CT CHEST, ABDOMEN, AND PELVIS WITH CONTRAST TECHNIQUE: Multidetector CT imaging of the chest, abdomen and pelvis was performed following the standard protocol during bolus administration of intravenous contrast. RADIATION DOSE REDUCTION: This exam was performed according to the departmental dose-optimization program which includes automated exposure control, adjustment of the mA and/or kV according to patient size and/or use of iterative reconstruction technique. CONTRAST:  722mOMNIPAQUE IOHEXOL 350 MG/ML SOLN COMPARISON:  Chest x-ray, same date. FINDINGS: CT CHEST FINDINGS Cardiovascular: The heart is mildly enlarged. No pericardial effusion. The pacer wires are in good position without complicating features. Advanced aortic and coronary artery calcifications. 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 Recommend non urgent thoracic surgery consultation. No aortic dissection. Stable three-vessel coronary artery calcifications. Mediastinum/Nodes: Scattered mediastinal hilar lymph nodes likely reactive given lung findings. There is an NG tube coursing down the esophagus and into the stomach. The endotracheal tube is in good position at the mid tracheal level. Lungs/Pleura: Extensive bilateral pulmonary infiltrates with dense left lower lobe airspace consolidation. Findings highly suspicious for aspiration. Underlying emphysematous changes and pulmonary scarring. Small bilateral pleural effusions. Musculoskeletal: No significant bony findings. The sternum, ribs and thoracic vertebral bodies are intact. CT ABDOMEN PELVIS FINDINGS Hepatobiliary: No hepatic lesions or intrahepatic biliary dilatation. Small blush of contrast enhancement in the left hepatic lobe not present on delayed images. This is likely a small vascular  shunt. Gallbladder is grossly normal. It is surrounded by fluid. No common bile duct dilatation. Pancreas: No mass, inflammation or ductal dilatation. Spleen: Normal size.  No focal lesions. Adrenals/Urinary Tract: The adrenal glands and kidneys are unremarkable. Moderate renal cortical thinning on the right side but no worrisome renal lesions or hydronephrosis. The bladder contains a Foley catheter. Stomach/Bowel: The stomach, duodenum, small bowel and colon are grossly normal. No obvious acute inflammatory process, mass lesions or obstructive findings. Vascular/Lymphatic: Advanced atherosclerotic calcifications involving the aorta and branch vessels but no aneurysm or dissection. The major venous structures are patent. No mesenteric or retroperitoneal mass or adenopathy. Reproductive: The prostate gland and seminal vesicles are unremarkable. Other: Small amount of free abdominal and free pelvic fluid along with diffuse body wall edema suggesting anasarca. Right  inguinal hernia contains small bowel loops. I do not see any findings for incarceration or obstruction. Musculoskeletal: No significant bony findings. No spine or pelvic fractures are identified. The pubic symphysis and SI joints are intact. IMPRESSION: 1. Extensive bilateral pulmonary infiltrates with dense left lower lobe airspace consolidation. Findings highly suspicious for aspiration pneumonia. 2. Small bilateral pleural effusions. 3. No acute abdominal/pelvic findings. 4. Small amount of free abdominal and free pelvic fluid along with diffuse body wall edema suggesting anasarca. 5. Right inguinal hernia contains small bowel loops. I do not see any findings for incarceration or obstruction. 6. Advanced atherosclerotic calcifications involving the thoracic and abdominal aorta and branch vessels including the coronary arteries. 7. 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 Recommend  non urgent thoracic surgery consultation. Electronically Signed   By: Marijo Sanes M.D.   On: 01/23/2022 13:07   CT Head Wo Contrast  Result Date: 01/08/2022 CLINICAL DATA:  Delirium.  Found down this morning. EXAM: CT HEAD WITHOUT CONTRAST TECHNIQUE: Contiguous axial images were obtained from the base of the skull through the vertex without intravenous contrast. RADIATION DOSE REDUCTION: This exam was performed according to the departmental dose-optimization program which includes automated exposure control, adjustment of the mA and/or kV according to patient size and/or use of iterative reconstruction technique. COMPARISON:  Head CT 11/16/2021 FINDINGS: Brain: Stable age related advanced cerebral atrophy, ventriculomegaly and periventricular white matter disease. No extra-axial fluid collections are identified. No CT findings for acute hemispheric infarction or intracranial hemorrhage. No mass lesions. The brainstem and cerebellum are normal. Vascular: Stable vascular calcifications.  No hyperdense vessels. Skull: No skull fracture or bone lesions. Sinuses/Orbits: The paranasal sinuses and mastoid air cells are clear. The globes are intact. Other: No scalp lesions or scalp hematoma. IMPRESSION: 1. Stable age related advanced cerebral atrophy, ventriculomegaly and periventricular white matter disease. 2. No acute intracranial findings or skull fracture. Electronically Signed   By: Marijo Sanes M.D.   On: 01/15/2022 12:30   DG Chest Portable 1 View  Result Date: 12/28/2021 CLINICAL DATA:  88 year old male intubated. EXAM: PORTABLE CHEST 1 VIEW COMPARISON:  Portable chest 1021 hours today and earlier. FINDINGS: Portable AP semi upright view at 1138 hours. Endotracheal tube tip in good position between the level the clavicles and carina. Enteric tube courses to the abdomen, side hole is near the expected level of the GEJ at or just below the diaphragm. Stable lung volumes. Stable cardiomegaly and  mediastinal contours. Right chest dual lead cardiac pacemaker. Left greater than right dense peribronchial and lower lobe opacity, no definite air bronchograms. Left pleural effusion is not excluded. Pulmonary vascularity does not appear significantly changed since August. No pneumothorax. Calcified aortic atherosclerosis. No acute osseous abnormality identified. IMPRESSION: 1. Endotracheal tube tip in good position. Enteric tube side hole near the GEJ, advanced 5 cm to ensure side hole placement inside the stomach. 2. Stable ventilation with confluent lower lung left > right opacity. Left pleural effusion is possible. No overt edema. Electronically Signed   By: Genevie Ann M.D.   On: 01/10/2022 11:49   DG Chest Port 1 View  Result Date: 01/09/2022 CLINICAL DATA:  Questionable sepsis EXAM: PORTABLE CHEST 1 VIEW COMPARISON:  11/16/2021 FINDINGS: Indistinct airspace disease on the left more than right. Cardiomegaly with dual-chamber pacer leads from the right. No definite effusion, although possible on the left. No pneumothorax. IMPRESSION: Bilateral airspace disease which could  be edema or pneumonia. A similar appearance was seen 11/16/2021 Electronically Signed   By: Jorje Guild M.D.   On: 01/20/2022 10:30     Assessment and Plan:   Elevated troponin -Hs-troponin 9833>>8250 -LHC 2012 with 50-60% ostial LAD, 50-60%mLAD, 60% pRCA.  -Low risk stress test in 2016 - Echo this admission showed mildly reduced LV function at 45 to 50% from 55 to 60% in 2020.  Moderate LVH with grade 2 diastolic dysfunction.   -Likely demand ischemia in setting of being down for unknown period of time and septic shock from aspiration pneumonia.  No CK was checked -Seems patient not a candidate for invasive evaluation given baseline dementia and unknown functional status.  However could evaluate when stable. -Recommend heparin gtt x 48 hours  2.  Persistent atrial flutter -Prior history of cardioversion.  Per atrial  fibrillation clinic note, plan for rate control strategy.  Paced rhythm currently -Patient was previously on Eliquis.  This was discontinued to 08/13/19 after fall leading to subdural hematoma. -?  He has recurrent falls since then.  No acute finding on CT this admission.  3. Chronic diastolic heart failure -Volume managed by dialysis  4.  Septic shock and acute hypoxic respiratory failure secondary to aspiration pneumonia - Per PCCM  5.  Chronic anemia -hemgGlobin stable between 9-10  Risk Assessment/Risk Scores:    CHA2DS2-VASc Score = 5   This indicates a 7.2% annual risk of stroke. The patient's score is based upon: CHF History: 1 HTN History: 1 Diabetes History: 0 Stroke History: 0 Vascular Disease History: 1 Age Score: 2 Gender Score: 0    For questions or updates, please contact [facility] Please consult [email or web address] for contact info under    Jarrett Soho, PA  01/13/2022 1:54 PM   Patient seen and examined.  Agree with above documentation.  Jeffrey Campbell is an 88 year old male with a history of CAD, ESRD, PAD, COPD, chronic diastolic heart failure, pulmonary hypertension, dementia, OSA, hypertension, persistent atrial fibrillation, complete heart block status post PPM who we are consulted by Dr. Tacy Learn for evaluation of troponin elevation.  Cath in 2012 showed moderate CAD (50 to 60% ostial LAD, 50 to 60% mid LAD, 60% proximal RCA).  He presented to ED yesterday after being found unresponsive by his daughter.  On presentation to the ED, was hypotensive and hypoxic, required intubation.  CT chest concerning for aspiration pneumonia.  Blood cultures growing strep.  Labs notable for troponin elevation (2957 > 4963), creatinine 4.66, potassium 3.3, albumin 2.8, AST 59, ALT 33, BNP 1900, lactate 3.4, hemoglobin 9.6, platelets 94.  Echocardiogram shows EF 45 to 50%, global hypokinesis, moderate LVH, grade 3 diastolic dysfunction, moderate RV dysfunction, RVSP  46, severe biatrial enlargement.  EKG shows AV paced rhythm.  On exam, patient is intubated but alert and follows commands, regular rate and rhythm, no murmurs, mechanical breath sounds, no LE edema.  For his troponin elevation, suspect demand ischemia in setting of critical illness and likely underlying obstructive CAD.  Would recommend medical management with heparin gtt x 48 hours. Mild global systolic dysfunction on echo, suspect due to septic shock.  Will monitor for improvement as he recovers.  CRITICAL CARE TIME: I have spent a total of 33 minutes with patient reviewing hospital notes, telemetry, EKGs, labs and examining the patient as well as establishing an assessment and plan.   The patient is critically ill with multi-organ system failure and requires high complexity decision making for assessment and  support, frequent evaluation and titration of therapies, application of advanced monitoring technologies and extensive interpretation of multiple databases.   Donato Heinz, MD

## 2022-01-13 NOTE — Progress Notes (Signed)
ANTICOAGULATION CONSULT NOTE - Initial Consult  Pharmacy Consult for Heparin Indication: chest pain/ACS  Allergies  Allergen Reactions   Cardura [Doxazosin] Shortness Of Breath and Other (See Comments)    Dizziness    Hydrochlorothiazide Shortness Of Breath and Other (See Comments)    Dizziness    Iodinated Contrast Media Other (See Comments)    "shut down his kidneys" Patient has stage III chronic kidney disease   Zocor [Simvastatin] Other (See Comments)    Kidney problems    Protonix [Pantoprazole] Other (See Comments)    Worsening kidney problems   Rocaltrol [Calcitriol] Other (See Comments)    Unknown reaction   Budesonide-Formoterol Fumarate Other (See Comments)    Dry mouth   Minodyl [Minoxidil] Other (See Comments)    Unknown raction   Tiotropium Bromide Monohydrate Other (See Comments)    Dry mouth   Tricor [Fenofibrate]     Unknown reaction    Patient Measurements: Height: '5\' 9"'$  (175.3 cm) Weight: 57.6 kg (126 lb 15.8 oz) IBW/kg (Calculated) : 70.7 Heparin Dosing Weight: 55kg   Vital Signs: Temp: 97.9 F (36.6 C) (10/18 1509) Temp Source: Oral (10/18 1509) BP: 136/63 (10/18 1515) Pulse Rate: 63 (10/18 1515)  Labs: Recent Labs    12/30/2021 1012 01/11/2022 1029 01/15/2022 1137 12/30/2021 1146 12/31/2021 1317 01/18/2022 1453 01/13/22 0604 01/13/22 1405  HGB 9.6* 9.5* 9.9* 9.9*  --   --  9.2*  --   HCT 30.5* 28.0* 29.0* 29.0*  --   --  28.0*  --   PLT 94*  --   --   --   --   --  75*  --   APTT 42*  --   --   --   --   --   --   --   LABPROT 17.7*  --   --   --   --   --   --   --   INR 1.5*  --   --   --   --   --   --   --   CREATININE 4.66* 4.80*  --   --   --   --  5.59* 6.02*  TROPONINIHS  --   --   --   --  2,957* 9,924*  --   --     Estimated Creatinine Clearance: 7.2 mL/min (A) (by C-G formula based on SCr of 6.02 mg/dL (H)).   Medical History: Past Medical History:  Diagnosis Date   Anemia    low iron   BPH (benign prostatic hyperplasia)     CAD (coronary artery disease)    Carotid artery stenosis    1-39% bilateral carotid artery stenosis and < 50% stenosis in the right CCA by dopplers 06/2017   CKD (chronic kidney disease), stage III (HCC)    Stage 4   Diastolic dysfunction    Glaucoma    Graves disease    History of ETOH abuse    Hyperlipemia    Hypertension    Hyperthyroidism 08/26/10   radioactive iodine therapy    Memory loss    Mitral regurgitation echo 2015   mild   Multiple thyroid nodules    MVP (mitral valve prolapse) 11/2012   posterior MVP   OSA (obstructive sleep apnea)    upper airway resistance syndrome with RDI 18/hr - not on CPAP due to insurance not covering   Pre-diabetes    PUD (peptic ulcer disease)    Pulmonary hypertension ([place]) echo 2015  Group 2 with pulmonary venous HTN and Group 3 with OSA   Upper airway resistance syndrome    Assessment: 88 yo male presented with sepsis found to have strep pneumoniae bacteremia. Pharmacy consulted to dose heparin for ACS. SQH given ~1400. Hgb 9.2. Plts 75.   Goal of Therapy:  Heparin level 0.3-0.7 units/ml Monitor platelets by anticoagulation protocol: Yes   Plan:  Start heparin 650 units/hr  No bolus per cardiology and CCM  Check 8 hr heparin level  Monitor heparin level, CBC and s/s of bleeding daily  Watch plts closely   Cristela Felt, PharmD, BCPS Clinical Pharmacist 01/13/2022 3:27 PM

## 2022-01-13 NOTE — Procedures (Signed)
Extubation Procedure Note  Patient Details:   Name: Jeffrey Campbell DOB: 21-Aug-1932 MRN: [id number]   Airway Documentation:  Airway 7.5 mm (Active)  Secured at (cm) 26 cm 01/13/22 1922  Measured From Lips 01/13/22 1922  Secured Location Right 01/13/22 1922  Secured By [employer] 01/13/22 1922  Tube Holder Repositioned Yes 01/13/22 1922  Prone position No 01/13/22 1922  Cuff Pressure (cm H2O) Green OR 18-26 CmH2O 01/13/22 1922  Site Condition Dry 01/13/22 1922   Vent end date: (not recorded) Vent end time: (not recorded)   Evaluation  O2 sats: stable throughout Complications: No apparent complications Patient did tolerate procedure well. Bilateral Breath Sounds: Diminished, Rhonchi   Yes  [name] 01/13/2022, 9:05 PM

## 2022-01-13 NOTE — Consult Note (Addendum)
Jeffrey Campbell Renal Consultation Note    Indication for Consultation:  Management of ESRD/hemodialysis; anemia, hypertension/volume and secondary hyperparathyroidism  Jeffrey Campbell, Jeffrey Luo, Jeffrey Campbell  HPI: Jeffrey Campbell is a 88 y.o. male with ESRD on HD MWF at [facility].  Past medical history significant for CAD, HLD, HTN, dementia, OSA, pHTN, DMT2, hypothyroidism, COPD, PAD, complete heart block s/p pacemaker and MVP w/MR.   Patient seen and examined at bedside in ICU. No family present.  History obtained through chart review.  Patient presented to the ED after being found unresponsive at home. Daughter found patient unresponsive making gurgling sounds.  Family reported tiredness and weakness over the last 24 hours.  In the ED patient minimally responsive and tachypnea and hypotension requiring intubation.  Pertinant findings in work up include CXR with LLL infiltrates, CT chest w/extensive b/l infiltrates and concern for aspiration in LLL, ECHO with decreased in EF to 45-50%, BC+ streptococcus pneumoniae, K 4.3, Scr 5.59, Ca 8.9, troponin 4,963, hgb 9.2. Patient admitted for further evaluation and management.   Patient compliant with prescribed dialysis regimen.  Completed full treatment on 01/11/22 leaving 3kg over his dry weight.     Past Medical History:  Diagnosis Date   Anemia    low iron   BPH (benign prostatic hyperplasia)    CAD (coronary artery disease)    Carotid artery stenosis    1-39% bilateral carotid artery stenosis and < 50% stenosis in the right CCA by dopplers 06/2017   CKD (chronic kidney disease), stage III (HCC)    Stage 4   Diastolic dysfunction    Glaucoma    Graves disease    History of ETOH abuse    Hyperlipemia    Hypertension    Hyperthyroidism 08/26/10   radioactive iodine therapy    Memory loss    Mitral regurgitation echo 2015   mild   Multiple thyroid nodules    MVP (mitral valve prolapse) 11/2012   posterior MVP   OSA (obstructive sleep apnea)     upper airway resistance syndrome with RDI 18/hr - not on CPAP due to insurance not covering   Pre-diabetes    PUD (peptic ulcer disease)    Pulmonary hypertension ([place]) echo 2015   Group 2 with pulmonary venous HTN and Group 3 with OSA   Upper airway resistance syndrome    Past Surgical History:  Procedure Laterality Date   APPENDECTOMY     AV FISTULA PLACEMENT Left 10/18/2017   Procedure: ARTERIOVENOUS (AV) FISTULA CREATION ARM;  Surgeon: Waynetta Sandy, Jeffrey Campbell;  Location: [place];  Service: Vascular;  Laterality: Left;   CARDIAC CATHETERIZATION     CARDIAC CATHETERIZATION N/A 02/17/2015   Procedure: Right Heart Cath;  Surgeon: Larey Dresser, Jeffrey Campbell;  Location: [place] [facility];  Service: Cardiovascular;  Laterality: N/A;   CARDIOVERSION N/A 02/08/2019   Procedure: CARDIOVERSION;  Surgeon: Donato Heinz, Jeffrey Campbell;  Location: [place];  Service: Endoscopy;  Laterality: N/A;   ESOPHAGOGASTRODUODENOSCOPY (EGD) WITH PROPOFOL Left 10/16/2016   Procedure: ESOPHAGOGASTRODUODENOSCOPY (EGD) WITH PROPOFOL;  Surgeon: Ronnette Juniper, Jeffrey Campbell;  Location: [place];  Service: Gastroenterology;  Laterality: Left;   heart catherization     HERNIA REPAIR  10/2009   IR RADIOLOGIST EVAL & MGMT  09/28/2016   IR RADIOLOGIST EVAL & MGMT  10/19/2016   IR RADIOLOGIST EVAL & MGMT  01/18/2017   PACEMAKER IMPLANT N/A 02/09/2019   Procedure: PACEMAKER IMPLANT;  Surgeon: Constance Haw, Jeffrey Campbell;  Location: [place] [facility];  Service: Cardiovascular;  Laterality: N/A;   RIGHT HEART CATH N/A 07/28/2016   Procedure: Right Heart Cath;  Surgeon: Larey Dresser, Jeffrey Campbell;  Location: [place] [facility];  Service: Cardiovascular;  Laterality: N/A;   Family History  Problem Relation Age of Onset   Kidney failure Father    Hypertension Father    Colon cancer Mother    Colon cancer Brother    Lung disease Brother    Colon cancer Maternal Uncle    Social History:  reports that he quit smoking about 37 years ago. His  smoking use included cigarettes. He has a 80.00 pack-year smoking history. He has never used smokeless tobacco. He reports that he does not drink alcohol and does not use drugs. Allergies  Allergen Reactions   Cardura [Doxazosin] Shortness Of Breath and Other (See Comments)    Dizziness    Hydrochlorothiazide Shortness Of Breath and Other (See Comments)    Dizziness    Iodinated Contrast Media Other (See Comments)    "shut down his kidneys" Patient has stage III chronic kidney disease   Zocor [Simvastatin] Other (See Comments)    Kidney problems    Protonix [Pantoprazole] Other (See Comments)    Worsening kidney problems   Rocaltrol [Calcitriol] Other (See Comments)    Unknown reaction   Budesonide-Formoterol Fumarate Other (See Comments)    Dry mouth   Minodyl [Minoxidil] Other (See Comments)    Unknown raction   Tiotropium Bromide Monohydrate Other (See Comments)    Dry mouth   Tricor [Fenofibrate]     Unknown reaction   Prior to Admission medications   Medication Sig Start Date End Date Taking? Authorizing Provider  atorvastatin (LIPITOR) 20 MG tablet TAKE 1 BY MOUTH DAILY ABSOLUTE LAST REFILL WITHOUT OFFICE VISIT PLEASE CALL [phone] TO SCHEDULE Patient taking differently: Take 20 mg by mouth daily. 11/09/21  Yes Larey Dresser, Jeffrey Campbell  Brinzolamide-Brimonidine [facility]) 1-0.2 % SUSP Place 1 drop into both eyes 2 (two) times daily.    Yes Provider, Historical, Jeffrey Campbell  calcium acetate (PHOSLO) 667 MG capsule Take 667 mg by mouth See admin instructions. 667 mg 3 times daily with meals, 667 mg 2 times daily with snacks. 11/30/19  Yes Provider, Historical, Jeffrey Campbell  donepezil (ARICEPT) 10 MG tablet Take 1 tablet (10 mg total) by mouth at bedtime. 08/17/21  Yes Suzzanne Cloud, Jeffrey Campbell  doxazosin (CARDURA) 4 MG tablet Take 1 tablet (4 mg total) by mouth daily. Patient taking differently: Take 4 mg by mouth 2 (two) times daily. 02/12/19  Yes Simmons, Brittainy M, Jeffrey Campbell  levothyroxine (SYNTHROID,  LEVOTHROID) 150 MCG tablet Take 150 mcg by mouth daily before breakfast.   Yes Provider, Historical, Jeffrey Campbell  memantine (NAMENDA) 10 MG tablet TAKE 1 TABLET BY MOUTH TWICE A DAY Patient taking differently: Take 10 mg by mouth 2 (two) times daily. 01/19/21  Yes Suzzanne Cloud, Jeffrey Campbell  multivitamin (RENA-VIT) TABS tablet Take 1 tablet by mouth daily. 11/01/19  Yes Provider, Historical, Jeffrey Campbell   Current Facility-Administered Medications  Medication Dose Route Frequency Provider Last Rate Last Admin   0.9 %  sodium chloride infusion  250 mL Intravenous Continuous Jeffrey Campbell, Jeffrey Campbell 10 mL/hr at 01/13/22 7510 Infusion Verify at 01/13/22 0649   atorvastatin (LIPITOR) tablet 20 mg  20 mg Per Tube Daily Jeffrey Person, Jeffrey Campbell       ceFEPIme (MAXIPIME) 1 g in sodium chloride 0.9 % 100 mL IVPB  1 g Intravenous Q24H Jeffrey Campbell, Jeffrey Campbell, Jeffrey Campbell  Chlorhexidine Gluconate Cloth 2 % PADS 6 each  6 each Topical Daily Jeffrey Brood, Jeffrey Campbell   6 each at 01/05/2022 1442   docusate (COLACE) 50 MG/5ML liquid 100 mg  100 mg Per Tube BID PRN Jeffrey Person, Jeffrey Campbell       docusate (COLACE) 50 MG/5ML liquid 100 mg  100 mg Per Tube BID Jeffrey Brood, Jeffrey Campbell       donepezil (ARICEPT) tablet 10 mg  10 mg Per Tube QHS Jeffrey Person, Jeffrey Campbell   10 mg at 01/13/2022 2127   fentaNYL (SUBLIMAZE) injection 25-50 mcg  25-50 mcg Intravenous Q2H PRN Jeffrey Simmonds, Jeffrey Campbell   50 mcg at 01/09/2022 2143   heparin injection 5,000 Units  5,000 Units Subcutaneous Q8H Jeffrey Person, Jeffrey Campbell   5,000 Units at 01/13/22 6283   levothyroxine (SYNTHROID) tablet 150 mcg  150 mcg Per Tube Q0600 Jeffrey Person, Jeffrey Campbell   150 mcg at 01/13/22 0622   norepinephrine (LEVOPHED) '4mg'$  in 236m (0.016 mg/mL) premix infusion  2-10 mcg/min Intravenous Titrated [facility]ri [facility]  Held at 01/22/2022 2127   Oral care mouth rinse  15 mL Mouth Rinse Q2H Agarwala, REinar Grad Jeffrey Campbell   15 mL at 01/13/22 0745   Oral care mouth rinse  15 mL Mouth Rinse PRN AKipp Brood Jeffrey Campbell       pantoprazole (PROTONIX) 2 mg/mL  oral suspension 40 mg  40 mg Per Tube Daily EOmar Person Jeffrey Campbell       polyethylene glycol (MIRALAX / GLYCOLAX) packet 17 g  17 g Per Tube Daily PRN EOmar Person Jeffrey Campbell       polyethylene glycol (MIRALAX / GLYCOLAX) packet 17 g  17 g Per Tube Daily Agarwala, REinar Grad Jeffrey Campbell       propofol (DIPRIVAN) 1000 MG/100ML infusion  5-80 mcg/kg/min Intravenous Continuous Jeffrey Campbell, [place], Jeffrey Campbell 3.35 mL/hr at 01/13/22 0649 10 mcg/kg/min at 01/13/22 0649   vancomycin variable dose per unstable renal function (pharmacist dosing)   Does not apply See admin instructions BM[facility]rilee Jansky [facility]      Labs: Basic Metabolic Panel: Recent Labs  Lab 01/19/2022 1012 01/08/2022 1029 01/25/2022 1137 01/07/2022 1146 01/11/2022 1453 01/13/22 0604  NA 139 138 138 139  --  138  K 3.3* 3.3* 3.3* 3.3*  --  3.4*  CL 98 96*  --   --   --  97*  CO2 29  --   --   --   --  24  GLUCOSE 67* 68*  --   --   --  66*  BUN 30* 29*  --   --   --  46*  CREATININE 4.66* 4.80*  --   --   --  5.59*  CALCIUM 8.5*  --   --   --   --  8.9  PHOS  --   --   --   --  3.8 3.9   Liver Function Tests: Recent Labs  Lab 01/03/2022 1012  AST 59*  ALT 33  ALKPHOS 53  BILITOT 1.2  PROT 5.6*  ALBUMIN 2.8*    CBC: Recent Labs  Lab 01/11/2022 1012 01/06/2022 1029 01/05/2022 1137 01/04/2022 1146 01/13/22 0604  WBC 7.0  --   --   --  7.9  NEUTROABS 5.6  --   --   --   --   HGB 9.6*   < > 9.9* 9.9* 9.2*  HCT 30.5*   < > 29.0* 29.0* 28.0*  MCV 103.7*  --   --   --  98.9  PLT 94*  --   --   --  75*   < > = values in this interval not displayed.   CBG: Recent Labs  Lab 01/13/2022 1523 01/11/2022 1913 01/23/2022 2317 01/13/22 0317 01/13/22 0708  GLUCAP 152* 101* 81 77 92    Studies/Results: ECHOCARDIOGRAM COMPLETE  Result Date: 01/24/2022    ECHOCARDIOGRAM REPORT   Patient Name:   [name] Date of Exam: 12/29/2021 Medical Rec #:  [id number]     Height:       69.0 in Accession #:    [id number]    Weight:       123.2 lb Date of Birth:  [birth year]      BSA:          1.682 m Patient Age:    39 years      BP:           95/65 mmHg Patient Gender: M             HR:           60 bpm. Exam Location:  Inpatient Procedure: 2D Echo, Color Doppler and Cardiac Doppler Indications:    R06.9 DOE  History:        Patient has prior history of Echocardiogram examinations, most                 recent 01/05/2019. CHF, CAD, COPD; Risk Factors:Hypertension,                 Diabetes, Dyslipidemia, Sleep Apnea and Former Smoker.  Sonographer:    Raquel Sarna Senior RDCS Referring Phys: [id number] Jeffrey Campbell  Sonographer Comments: Scanned supine on artificial respirator. IMPRESSIONS  1. Left ventricular ejection fraction, by estimation, is 45 to 50%. Left ventricular ejection fraction by 2D MOD biplane is 46.3 %. The left ventricle has mildly decreased function. The left ventricle demonstrates global hypokinesis. There is moderate left ventricular hypertrophy. Left ventricular diastolic parameters are consistent with Grade III diastolic dysfunction (restrictive). Elevated left ventricular end-diastolic pressure.  2. Right ventricular systolic function is moderately reduced. The right ventricular size is mildly enlarged. There is moderately elevated pulmonary artery systolic pressure. The estimated right ventricular systolic pressure is 56.3 mmHg.  3. Left atrial size was severely dilated.  4. Right atrial size was severely dilated.  5. The mitral valve is abnormal. Mild mitral valve regurgitation.  6. The tricuspid valve is abnormal. Tricuspid valve regurgitation is mild to moderate.  7. The aortic valve is tricuspid. Aortic valve regurgitation is trivial. Aortic valve sclerosis is present, with no evidence of aortic valve stenosis.  8. Mildly dilated pulmonary artery.  9. The inferior vena cava is dilated in size with <50% respiratory variability, suggesting right atrial pressure of 15 mmHg. Comparison(s): Changes from prior study are noted. 01/05/2019: LVEF 55-60%. FINDINGS  Left  Ventricle: Left ventricular ejection fraction, by estimation, is 45 to 50%. Left ventricular ejection fraction by 2D MOD biplane is 46.3 %. The left ventricle has mildly decreased function. The left ventricle demonstrates global hypokinesis. The left ventricular internal cavity size was normal in size. There is moderate left ventricular hypertrophy. Abnormal (paradoxical) septal motion, consistent with left bundle branch block. Left ventricular diastolic parameters are consistent with Grade III diastolic dysfunction (restrictive). Elevated left ventricular end-diastolic pressure. Right Ventricle: The right ventricular size is mildly enlarged. No increase in right ventricular wall thickness. Right ventricular systolic function is moderately reduced. There is moderately elevated pulmonary artery systolic pressure. The  tricuspid regurgitant velocity is 2.78 m/s, and with an assumed right atrial pressure of 15 mmHg, the estimated right ventricular systolic pressure is 89.3 mmHg. Left Atrium: Left atrial size was severely dilated. Right Atrium: Right atrial size was severely dilated. Pericardium: There is no evidence of pericardial effusion. Mitral Valve: The mitral valve is abnormal. There is mild thickening of the anterior and posterior mitral valve leaflet(s). Mild mitral valve regurgitation. Tricuspid Valve: The tricuspid valve is abnormal. Tricuspid valve regurgitation is mild to moderate. Aortic Valve: The aortic valve is tricuspid. Aortic valve regurgitation is trivial. Aortic valve sclerosis is present, with no evidence of aortic valve stenosis. Pulmonic Valve: The pulmonic valve was grossly normal. Pulmonic valve regurgitation is trivial. Aorta: The aortic root and ascending aorta are structurally normal, with no evidence of dilitation. Pulmonary Artery: The pulmonary artery is mildly dilated. Venous: The inferior vena cava is dilated in size with less than 50% respiratory variability, suggesting right atrial  pressure of 15 mmHg. IAS/Shunts: No atrial level shunt detected by color flow Doppler. Additional Comments: There is pleural effusion in the left lateral region.  LEFT VENTRICLE PLAX 2D                        Biplane EF (MOD) LVIDd:         3.50 cm         LV Biplane EF:   Left LVIDs:         2.60 cm                          ventricular LV PW:         1.60 cm                          ejection LV IVS:        1.30 cm                          fraction by LVOT diam:     2.00 cm                          2D MOD LV SV:         62                               biplane is LV SV Index:   37                               46.3 %. LVOT Area:     3.14 cm                                Diastology                                LV e' medial:    6.96 cm/s LV Volumes (MOD)               LV E/e' medial:  14.2 LV vol d, MOD    110.0 ml      LV e' lateral:   9.14 cm/s A2C:  LV E/e' lateral: 10.8 LV vol d, MOD    125.0 ml A4C: LV vol s, MOD    56.6 ml A2C: LV vol s, MOD    73.5 ml A4C: LV SV MOD A2C:   53.4 ml LV SV MOD A4C:   125.0 ml LV SV MOD BP:    53.4 ml RIGHT VENTRICLE RV S prime:     6.96 cm/s TAPSE (M-mode): 1.1 cm LEFT ATRIUM              Index        RIGHT ATRIUM           Index LA diam:        3.90 cm  2.32 cm/m   RA Area:     31.00 cm LA Vol (A2C):   137.0 ml 81.46 ml/m  RA Volume:   115.00 ml 68.38 ml/m LA Vol (A4C):   82.1 ml  48.82 ml/m LA Biplane Vol: 111.0 ml 66.00 ml/m  AORTIC VALVE LVOT Vmax:   110.00 cm/s LVOT Vmean:  77.900 cm/s LVOT VTI:    0.196 m  AORTA Ao Root diam: 3.30 cm Ao Asc diam:  2.90 cm MITRAL VALVE               TRICUSPID VALVE MV Area (PHT): 3.37 cm    TR Peak grad:   30.9 mmHg MV Decel Time: 225 msec    TR Vmax:        278.00 cm/s MV E velocity: 98.50 cm/s MV A velocity: 43.90 cm/s  SHUNTS MV E/A ratio:  2.24        Systemic VTI:  0.20 m                            Systemic Diam: 2.00 cm Jeffrey Bishop Jeffrey Campbell Electronically signed by Jeffrey Bishop Jeffrey Campbell Signature Date/Time:  01/25/2022/6:17:08 PM    Final    EEG adult  Result Date: 01/11/2022 Jeffrey Havens, Jeffrey Campbell     01/24/2022  3:41 PM Patient Name: Jeffrey Campbell MRN: [id number] Epilepsy Attending: Lora Campbell Referring Physician/Provider: Kipp Brood, Jeffrey Campbell Date: 12/27/2021 Duration: Patient history: 88 year old male with altered mental status.  EEG to evaluate for seizure. Level of alertness: Awake AEDs during EEG study: None Technical aspects: This EEG study was done with scalp electrodes positioned according to the 10-20 International system of electrode placement. Electrical activity was reviewed with band pass filter of 1-'70Hz'$ , sensitivity of 7 uV/mm, display speed of 20m/sec with a '60Hz'$  notched filter applied as appropriate. EEG data were recorded continuously and digitally stored.  Video monitoring was available and reviewed as appropriate. Description: No clear posterior dominant rhythm was seen. EEG showed continuous generalized predominantly 5 to 6 Hz theta slowing admixed with intermittent generalized 2-'3Hz'$  delta slowing.  Hyperventilation and photic stimulation were not performed.   ABNORMALITY - Continuous slow, generalized IMPRESSION: This study is suggestive of moderate diffuse encephalopathy, nonspecific etiology. No seizures or epileptiform discharges were seen throughout the recording. PLora Campbell  CT CHEST ABDOMEN PELVIS W CONTRAST  Result Date: 01/22/2022 CLINICAL DATA:  Sepsis.  Found down. EXAM: CT CHEST, ABDOMEN, AND PELVIS WITH CONTRAST TECHNIQUE: Multidetector CT imaging of the chest, abdomen and pelvis was performed following the standard protocol during bolus administration of intravenous contrast. RADIATION DOSE REDUCTION: This exam was performed according to the departmental dose-optimization program which includes automated exposure control, adjustment of the mA and/or kV according to  patient size and/or use of iterative reconstruction technique. CONTRAST:  62m OMNIPAQUE IOHEXOL 350  MG/ML SOLN COMPARISON:  Chest x-ray, same date. FINDINGS: CT CHEST FINDINGS Cardiovascular: The heart is mildly enlarged. No pericardial effusion. The pacer wires are in good position without complicating features. Advanced aortic and coronary artery calcifications. 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 Recommend non urgent thoracic surgery consultation. No aortic dissection. Stable three-vessel coronary artery calcifications. Mediastinum/Nodes: Scattered mediastinal hilar lymph nodes likely reactive given lung findings. There is an NG tube coursing down the esophagus and into the stomach. The endotracheal tube is in good position at the mid tracheal level. Lungs/Pleura: Extensive bilateral pulmonary infiltrates with dense left lower lobe airspace consolidation. Findings highly suspicious for aspiration. Underlying emphysematous changes and pulmonary scarring. Small bilateral pleural effusions. Musculoskeletal: No significant bony findings. The sternum, ribs and thoracic vertebral bodies are intact. CT ABDOMEN PELVIS FINDINGS Hepatobiliary: No hepatic lesions or intrahepatic biliary dilatation. Small blush of contrast enhancement in the left hepatic lobe not present on delayed images. This is likely a small vascular shunt. Gallbladder is grossly normal. It is surrounded by fluid. No common bile duct dilatation. Pancreas: No mass, inflammation or ductal dilatation. Spleen: Normal size.  No focal lesions. Adrenals/Urinary Tract: The adrenal glands and kidneys are unremarkable. Moderate renal cortical thinning on the right side but no worrisome renal lesions or hydronephrosis. The bladder contains a Foley catheter. Stomach/Bowel: The stomach, duodenum, small bowel and colon are grossly normal. No obvious acute inflammatory process, mass lesions or obstructive findings. Vascular/Lymphatic: Advanced atherosclerotic calcifications involving the  aorta and branch vessels but no aneurysm or dissection. The major venous structures are patent. No mesenteric or retroperitoneal mass or adenopathy. Reproductive: The prostate gland and seminal vesicles are unremarkable. Other: Small amount of free abdominal and free pelvic fluid along with diffuse body wall edema suggesting anasarca. Right inguinal hernia contains small bowel loops. I do not see any findings for incarceration or obstruction. Musculoskeletal: No significant bony findings. No spine or pelvic fractures are identified. The pubic symphysis and SI joints are intact. IMPRESSION: 1. Extensive bilateral pulmonary infiltrates with dense left lower lobe airspace consolidation. Findings highly suspicious for aspiration pneumonia. 2. Small bilateral pleural effusions. 3. No acute abdominal/pelvic findings. 4. Small amount of free abdominal and free pelvic fluid along with diffuse body wall edema suggesting anasarca. 5. Right inguinal hernia contains small bowel loops. I do not see any findings for incarceration or obstruction. 6. Advanced atherosclerotic calcifications involving the thoracic and abdominal aorta and branch vessels including the coronary arteries. 7. 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 Recommend non urgent thoracic surgery consultation. Electronically Signed   By: PMarijo SanesM.D.   On: 01/24/2022 13:07   CT Head Wo Contrast  Result Date: 01/01/2022 CLINICAL DATA:  Delirium.  Found down this morning. EXAM: CT HEAD WITHOUT CONTRAST TECHNIQUE: Contiguous axial images were obtained from the base of the skull through the vertex without intravenous contrast. RADIATION DOSE REDUCTION: This exam was performed according to the departmental dose-optimization program which includes automated exposure control, adjustment of the mA and/or kV according to patient size and/or use of iterative reconstruction technique.  COMPARISON:  Head CT 11/16/2021 FINDINGS: Brain: Stable age related advanced cerebral atrophy, ventriculomegaly and periventricular white matter disease. No extra-axial fluid collections are identified. No CT  findings for acute hemispheric infarction or intracranial hemorrhage. No mass lesions. The brainstem and cerebellum are normal. Vascular: Stable vascular calcifications.  No hyperdense vessels. Skull: No skull fracture or bone lesions. Sinuses/Orbits: The paranasal sinuses and mastoid air cells are clear. The globes are intact. Other: No scalp lesions or scalp hematoma. IMPRESSION: 1. Stable age related advanced cerebral atrophy, ventriculomegaly and periventricular white matter disease. 2. No acute intracranial findings or skull fracture. Electronically Signed   By: Marijo Sanes M.D.   On: 01/05/2022 12:30   DG Chest Portable 1 View  Result Date: 12/31/2021 CLINICAL DATA:  88 year old male intubated. EXAM: PORTABLE CHEST 1 VIEW COMPARISON:  Portable chest 1021 hours today and earlier. FINDINGS: Portable AP semi upright view at 1138 hours. Endotracheal tube tip in good position between the level the clavicles and carina. Enteric tube courses to the abdomen, side hole is near the expected level of the GEJ at or just below the diaphragm. Stable lung volumes. Stable cardiomegaly and mediastinal contours. Right chest dual lead cardiac pacemaker. Left greater than right dense peribronchial and lower lobe opacity, no definite air bronchograms. Left pleural effusion is not excluded. Pulmonary vascularity does not appear significantly changed since August. No pneumothorax. Calcified aortic atherosclerosis. No acute osseous abnormality identified. IMPRESSION: 1. Endotracheal tube tip in good position. Enteric tube side hole near the GEJ, advanced 5 cm to ensure side hole placement inside the stomach. 2. Stable ventilation with confluent lower lung left > right opacity. Left pleural effusion is possible. No overt  edema. Electronically Signed   By: Genevie Ann M.D.   On: 01/10/2022 11:49   DG Chest Port 1 View  Result Date: 01/03/2022 CLINICAL DATA:  Questionable sepsis EXAM: PORTABLE CHEST 1 VIEW COMPARISON:  11/16/2021 FINDINGS: Indistinct airspace disease on the left more than right. Cardiomegaly with dual-chamber pacer leads from the right. No definite effusion, although possible on the left. No pneumothorax. IMPRESSION: Bilateral airspace disease which could be edema or pneumonia. A similar appearance was seen 11/16/2021 Electronically Signed   By: Jorje Guild M.D.   On: 01/09/2022 10:30    ROS: Unable to complete ROS due to sedation/AMS.   Physical Exam: Vitals:   01/13/22 0600 01/13/22 0630 01/13/22 0711 01/13/22 0715  BP: (!) 118/38 (!) 121/59    Pulse: 60 60  60  Resp: '16 19  15  '$ Temp:   98 F (36.7 Campbell)   TempSrc:   Oral   SpO2: 100% 100%  100%  Weight:      Height:         General: ill appearing male sedated and intubated Head: NCAT  MMM Neck: Supple.  Lungs: intubated, CTAB anteriorly  Heart: RRR.  Abdomen: soft,+BS Lower extremities:no peripheral edema, ischemic changes, or open wounds  Neuro: Sedated Dialysis Access:LU AVF +b/t  Dialysis Orders:  MWF  - [facility]  4hrs, BFR 400, DFR AF1.5,  EDW 54.5kg, 2K/ 2Ca  Access: AVF  Heparin 3000 unit bolus Calcitriol 0.5 mcg PO qHD Sensipar '120mg'$  qHD   Assessment/Plan: AMS - likely 2/2 bacteremia and PNA. EEG with no seizure activity. Hx dementia.   Acute respiratory failure - intubated. Concern for aspiration on CT. Per CCM Aspiration PNA - noted on CT. Intubated. On ABX Bacteremia - BC +streptococcus pneumoniae. On Vancomycin and cefepime  ESRD -  On HD MWF.  Plan for HD today at bedside via AVF.    Hypertension/volume  - initially hypotensive but BP improved with fluids.  3L over dry weight  but does not appear overloaded.  Plan for HD with minimal to no UF.   Anemia of CKD - Hgb 9.2.  No recent ESA, will start. No iron in  setting of acute infection.  Secondary Hyperparathyroidism -  Ca and phos in goal. No meds now as is NPO  Nutrition - NPO CAD, CHB s/p PPM DMT2 COPD  Jeffrey Campbell, Jeffrey Campbell [facility] 01/13/2022, 7:46 AM    Patient seen and examined, agree with above note with above modifications. Elderly demented ESRD patient.  Found unresponsive felt to have PNA possibly aspiration in addition to community acquired with bacteremia.  We are asked to provide his routine HD.  Today is his day so will plan for HD at some point at bedside due to vent-  does not seem too volume overloaded -  min UF with HD-  thankfully his BP has held up pretty well for this illness-  give ESA and watch other elytes  Corliss Parish, Jeffrey Campbell 01/13/2022

## 2022-01-13 NOTE — Progress Notes (Signed)
PCCM interval progress note:  Pt with blown ETT cuff overnight and asked to exchange tube, was under consideration for extubation today but became somnolent, so left intubated per notes.   He is now awake and following commands.  Trial PSV with good TV.  Seen and discussed with Dr. Loanne Drilling, will extubate and closely monitor overnight.   Otilio Carpen Mahiya Kercheval, PA-C [facility] See Amion for pager If no response to pager , please call [phone] until 7pm After 7:00 pm call Elink  [phone]?[place]

## 2022-01-13 NOTE — TOC Initial Note (Signed)
Transition of Care [facility]) - Initial/Assessment Note    Patient Details  Name: Jeffrey Campbell MRN: [id number] Date of Birth: 1933-03-17  Transition of Care [facility]) CM/SW Contact:    Ninfa Meeker, RN Phone Number: 01/13/2022, 4:31 PM  Clinical Narrative:                 Transition of care screening note:  Transition of Care Department [facility]) has reviewed patient and no TOC needs have been identified at this time. We will continue to monitor patient advancement through Interdisciplinary progressions. If new patient transition needs arise, please place a consult.           Patient Goals and CMS Choice        Expected Discharge Plan and Services                                                Prior Living Arrangements/Services                       Activities of Daily Living      Permission Sought/Granted                  Emotional Assessment              Admission diagnosis:  Encephalopathy [G93.40] Sepsis, due to unspecified organism, unspecified whether acute organ dysfunction present [facility]) [A41.9] Patient Active Problem List   Diagnosis Date Noted   Protein-calorie malnutrition, severe 01/13/2022   Sepsis ([place])    Acute respiratory failure with hypoxia (HCC)    Non-ST elevation (NSTEMI) myocardial infarction ([place])    Persistent atrial fibrillation ([place])    Encephalopathy 01/15/2022   COVID-19    Acute cystitis with hematuria    Encephalopathy acute 11/16/2021   Unspecified protein-calorie malnutrition ([place]) 08/21/2019   Iron deficiency anemia, unspecified 08/10/2019   Pain, unspecified 08/08/2019   Delirium    Palliative care encounter    Hypertension    ESRD (end stage renal disease) (HCC)    Elevated troponin    Other specified coagulation defects ([place]) 07/20/2019   Allergy, unspecified, initial encounter 07/18/2019   Shortness of breath 07/18/2019   Benign prostatic hyperplasia without lower urinary tract symptoms  07/17/2019   Gastro-esophageal reflux disease without esophagitis 07/17/2019   Other specified postprocedural states 07/17/2019   Secondary hyperparathyroidism of renal origin ([place]) 07/17/2019   Thyrotoxicosis with diffuse goiter without thyrotoxic crisis or storm 07/17/2019   Unilateral inguinal hernia, without obstruction or gangrene, not specified as recurrent 07/17/2019   Complete heart block ([place]) 02/08/2019   Atrial flutter ([place])    Dementia without behavioral disturbance ([place]) 09/18/2018   Goals of care, counseling/discussion    Palliative care by specialist    DNR (do not resuscitate) discussion    Hypothermia 09/01/2018   GI bleeding 10/15/2016   GI bleed 10/15/2016   Anemia of chronic disease    CKD (chronic kidney disease), stage V (HCC)    Hypertensive urgency    Vascular dementia with behavioral disturbance ([place]) 70/35/0093   Acute metabolic encephalopathy    [place]anch retinal vein occlusion of right eye 05/07/2016   Graves' orbitopathy 05/07/2016   Primary open angle glaucoma of left eye, mild stage 05/07/2016   Primary open angle glaucoma of right eye, severe stage 05/07/2016   PAD (peripheral artery disease) ([place])  04/20/2016   Bilateral renal artery stenosis (HCC) 02/18/2015   Pulmonary hypertension ([place]) 02/10/2015   Chronic diastolic CHF (congestive heart failure) ([place]) 02/10/2015   Renal bruit 01/07/2015   Carotid artery stenosis    Diastolic dysfunction    Bradycardia 05/29/2013   Glaucoma 04/09/2011   COPD (chronic obstructive pulmonary disease) ([place]) 09/21/2010   OSA (obstructive sleep apnea)    Upper airway resistance syndrome    Hypothyroidism    Type 2 diabetes, diet controlled ([place])    CAD (coronary artery disease)    Hyperlipemia    Hypertensive heart and chronic kidney disease with heart failure and stage 1 through stage 4 chronic kidney disease, or chronic kidney disease (HCC)    PUD (peptic ulcer disease)    PCP:  Lawerance Cruel, MD Pharmacy:    [id number] [place] [place] [id number]: [phone]: [phone] [facility] [address] [place] [address] [facility] [id number]: [phone]: [id number]: [phone]: [phone]    Social Determinants of Health (SDOH) Interventions    Readmission Risk Interventions     No data to display

## 2022-01-13 NOTE — Progress Notes (Signed)
PCCM Progress Note  Patient extubated to 4L. Sitting up in bed and appears comfortable.

## 2022-01-14 DIAGNOSIS — I5021 Acute systolic (congestive) heart failure: Secondary | ICD-10-CM | POA: Diagnosis not present

## 2022-01-14 DIAGNOSIS — E43 Unspecified severe protein-calorie malnutrition: Secondary | ICD-10-CM

## 2022-01-14 DIAGNOSIS — J9601 Acute respiratory failure with hypoxia: Secondary | ICD-10-CM | POA: Diagnosis not present

## 2022-01-14 DIAGNOSIS — R531 Weakness: Secondary | ICD-10-CM | POA: Diagnosis not present

## 2022-01-14 DIAGNOSIS — G9340 Encephalopathy, unspecified: Secondary | ICD-10-CM | POA: Diagnosis not present

## 2022-01-14 DIAGNOSIS — E8779 Other fluid overload: Secondary | ICD-10-CM | POA: Diagnosis not present

## 2022-01-14 DIAGNOSIS — R4182 Altered mental status, unspecified: Secondary | ICD-10-CM | POA: Diagnosis not present

## 2022-01-14 DIAGNOSIS — R7989 Other specified abnormal findings of blood chemistry: Secondary | ICD-10-CM | POA: Diagnosis not present

## 2022-01-14 DIAGNOSIS — I120 Hypertensive chronic kidney disease with stage 5 chronic kidney disease or end stage renal disease: Secondary | ICD-10-CM | POA: Diagnosis not present

## 2022-01-14 DIAGNOSIS — N2581 Secondary hyperparathyroidism of renal origin: Secondary | ICD-10-CM | POA: Diagnosis not present

## 2022-01-14 DIAGNOSIS — D631 Anemia in chronic kidney disease: Secondary | ICD-10-CM | POA: Diagnosis not present

## 2022-01-14 DIAGNOSIS — Z992 Dependence on renal dialysis: Secondary | ICD-10-CM | POA: Diagnosis not present

## 2022-01-14 DIAGNOSIS — N186 End stage renal disease: Secondary | ICD-10-CM | POA: Diagnosis not present

## 2022-01-14 DIAGNOSIS — A419 Sepsis, unspecified organism: Secondary | ICD-10-CM | POA: Diagnosis not present

## 2022-01-14 DIAGNOSIS — J189 Pneumonia, unspecified organism: Secondary | ICD-10-CM | POA: Diagnosis not present

## 2022-01-14 LAB — CBC
HCT: 27.9 % — ABNORMAL LOW (ref 39.0–52.0)
Hemoglobin: 9 g/dL — ABNORMAL LOW (ref 13.0–17.0)
MCH: 32.4 pg (ref 26.0–34.0)
MCHC: 32.3 g/dL (ref 30.0–36.0)
MCV: 100.4 fL — ABNORMAL HIGH (ref 80.0–100.0)
Platelets: 82 10*3/uL — ABNORMAL LOW (ref 150–400)
RBC: 2.78 MIL/uL — ABNORMAL LOW (ref 4.22–5.81)
RDW: 14.9 % (ref 11.5–15.5)
WBC: 7.6 10*3/uL (ref 4.0–10.5)
nRBC: 0 % (ref 0.0–0.2)

## 2022-01-14 LAB — BASIC METABOLIC PANEL
Anion gap: 16 — ABNORMAL HIGH (ref 5–15)
BUN: 23 mg/dL (ref 8–23)
CO2: 26 mmol/L (ref 22–32)
Calcium: 9 mg/dL (ref 8.9–10.3)
Chloride: 96 mmol/L — ABNORMAL LOW (ref 98–111)
Creatinine, Ser: 3.31 mg/dL — ABNORMAL HIGH (ref 0.61–1.24)
GFR, Estimated: 17 mL/min — ABNORMAL LOW (ref >60)
Glucose, Bld: 101 mg/dL — ABNORMAL HIGH (ref 70–99)
Potassium: 3.3 mmol/L — ABNORMAL LOW (ref 3.5–5.1)
Sodium: 138 mmol/L (ref 135–145)

## 2022-01-14 LAB — GLUCOSE, CAPILLARY
Glucose-Capillary: 85 mg/dL (ref 70–99)
Glucose-Capillary: 114 mg/dL — ABNORMAL HIGH (ref 70–99)
Glucose-Capillary: 104 mg/dL — ABNORMAL HIGH (ref 70–99)
Glucose-Capillary: 117 mg/dL — ABNORMAL HIGH (ref 70–99)
Glucose-Capillary: 114 mg/dL — ABNORMAL HIGH (ref 70–99)
Glucose-Capillary: 96 mg/dL (ref 70–99)

## 2022-01-14 LAB — HEPATITIS C ANTIBODY: HCV Ab: NONREACTIVE — AB

## 2022-01-14 LAB — PATHOLOGIST SMEAR REVIEW

## 2022-01-14 LAB — PHOSPHORUS: Phosphorus: 3.8 mg/dL (ref 2.5–4.6)

## 2022-01-14 LAB — HEPARIN LEVEL (UNFRACTIONATED)
Heparin Unfractionated: <0.1 IU/mL — ABNORMAL LOW (ref 0.30–0.70)
Heparin Unfractionated: <0.1 IU/mL — ABNORMAL LOW (ref 0.30–0.70)
Heparin Unfractionated: <0.1 IU/mL — ABNORMAL LOW (ref 0.30–0.70)

## 2022-01-14 LAB — MAGNESIUM: Magnesium: 1.6 mg/dL — ABNORMAL LOW (ref 1.7–2.4)

## 2022-01-14 LAB — HEPATITIS B SURFACE ANTIBODY, QUANTITATIVE: Hep B S AB Quant (Post): 144.8 m[IU]/mL (ref >9.9)

## 2022-01-14 LAB — T3: T3, Total: 23 ng/dL — ABNORMAL LOW (ref 71–180)

## 2022-01-14 LAB — HEPATITIS B SURFACE ANTIBODY,QUALITATIVE

## 2022-01-14 LAB — HEPATITIS B SURFACE ANTIGEN

## 2022-01-14 LAB — HEPATITIS B CORE ANTIBODY, TOTAL

## 2022-01-14 MED ORDER — POTASSIUM CHLORIDE 10 MEQ/100ML IV SOLN
10.0000 meq | INTRAVENOUS | Status: AC
  Administered 2022-01-14 (×3): 10 meq via INTRAVENOUS
  Filled 2022-01-14 (×3): qty 100

## 2022-01-14 MED ORDER — ASPIRIN 81 MG PO TBEC: 81.0000 mg | DELAYED_RELEASE_TABLET | Freq: Every day | ORAL | Status: DC

## 2022-01-14 MED ORDER — MAGNESIUM SULFATE IN D5W 1-5 GM/100ML-% IV SOLN
1.0000 g | Freq: Once | INTRAVENOUS | Status: AC
  Administered 2022-01-14: 1 g via INTRAVENOUS
  Filled 2022-01-14: qty 100

## 2022-01-14 MED ORDER — ORAL CARE MOUTH RINSE: 15.0000 mL | OROMUCOSAL | Status: DC | PRN

## 2022-01-14 MED ORDER — CINACALCET HCL 30 MG PO TABS
90.0000 mg | ORAL_TABLET | Freq: Every day | ORAL | Status: DC
  Filled 2022-01-14 (×3): qty 3

## 2022-01-14 MED ORDER — CALCITRIOL 0.25 MCG PO CAPS
0.5000 ug | ORAL_CAPSULE | ORAL | Status: DC
  Filled 2022-01-14 (×2): qty 1

## 2022-01-14 MED ORDER — DEXTROSE IN LACTATED RINGERS 5 % IV SOLN: INTRAVENOUS | Status: DC

## 2022-01-14 MED ORDER — DARBEPOETIN ALFA 100 MCG/0.5ML IJ SOSY
100.0000 ug | PREFILLED_SYRINGE | INTRAMUSCULAR | Status: DC
  Administered 2022-01-15: 100 ug via INTRAVENOUS
  Filled 2022-01-14 (×2): qty 0.5

## 2022-01-14 MED ORDER — CHLORHEXIDINE GLUCONATE CLOTH 2 % EX PADS
6.0000 | MEDICATED_PAD | Freq: Every day | CUTANEOUS | Status: DC
  Administered 2022-01-15 – 2022-01-20 (×4): 6 via TOPICAL

## 2022-01-14 NOTE — Progress Notes (Signed)
ANTICOAGULATION CONSULT NOTE - Initial Consult  Pharmacy Consult for Heparin Indication: chest pain/ACS  Allergies  Allergen Reactions   Cardura [Doxazosin] Shortness Of Breath and Other (See Comments)    Dizziness    Hydrochlorothiazide Shortness Of Breath and Other (See Comments)    Dizziness    Iodinated Contrast Media Other (See Comments)    "shut down his kidneys" Patient has stage III chronic kidney disease   Zocor [Simvastatin] Other (See Comments)    Kidney problems    Protonix [Pantoprazole] Other (See Comments)    Worsening kidney problems   Rocaltrol [Calcitriol] Other (See Comments)    Unknown reaction   Budesonide-Formoterol Fumarate Other (See Comments)    Dry mouth   Minodyl [Minoxidil] Other (See Comments)    Unknown raction   Tiotropium Bromide Monohydrate Other (See Comments)    Dry mouth   Tricor [Fenofibrate]     Unknown reaction    Patient Measurements: Height: '5\' 9"'$  (175.3 cm) Weight: 56.8 kg (125 lb 3.5 oz) IBW/kg (Calculated) : 70.7 Heparin Dosing Weight: 55kg   Vital Signs: Temp: 97.7 F (36.5 C) (10/19 1949) Temp Source: Axillary (10/19 1949) BP: 158/62 (10/19 2100) Pulse Rate: 65 (10/19 2100)  Labs: Recent Labs    12/31/2021 1012 12/28/2021 1029 01/25/2022 1146 01/01/2022 1317 01/02/2022 1453 01/13/22 0604 01/13/22 1405 01/14/22 0011 01/14/22 0453 01/14/22 1010 01/14/22 2052  HGB 9.6*   < > 9.9*  --   --  9.2*  --   --  9.0*  --   --   HCT 30.5*   < > 29.0*  --   --  28.0*  --   --  27.9*  --   --   PLT 94*  --   --   --   --  75*  --   --  82*  --   --   APTT 42*  --   --   --   --   --   --   --   --   --   --   LABPROT 17.7*  --   --   --   --   --   --   --   --   --   --   INR 1.5*  --   --   --   --   --   --   --   --   --   --   HEPARINUNFRC  --   --   --   --   --   --   --  <0.10*  --  <0.10* <0.10*  CREATININE 4.66*   < >  --   --   --  5.59* 6.02*  --  3.31*  --   --   TROPONINIHS  --   --   --  2,957* 5,284*  --   --   --    --   --   --    < > = values in this interval not displayed.     Estimated Creatinine Clearance: 12.9 mL/min (A) (by C-G formula based on SCr of 3.31 mg/dL (H)).   Medical History: Past Medical History:  Diagnosis Date   Anemia    low iron   BPH (benign prostatic hyperplasia)    CAD (coronary artery disease)    Carotid artery stenosis    1-39% bilateral carotid artery stenosis and < 50% stenosis in the right CCA by dopplers 06/2017   CKD (chronic  kidney disease), stage III ([place])    Stage 4   Diastolic dysfunction    Glaucoma    Graves disease    History of ETOH abuse    Hyperlipemia    Hypertension    Hyperthyroidism 08/26/10   radioactive iodine therapy    Memory loss    Mitral regurgitation echo 2015   mild   Multiple thyroid nodules    MVP (mitral valve prolapse) 11/2012   posterior MVP   OSA (obstructive sleep apnea)    upper airway resistance syndrome with RDI 18/hr - not on CPAP due to insurance not covering   Pre-diabetes    PUD (peptic ulcer disease)    Pulmonary hypertension ([place]) echo 2015   Group 2 with pulmonary venous HTN and Group 3 with OSA   Upper airway resistance syndrome    Assessment: 88 yo male presented with sepsis found to have strep pneumoniae bacteremia. Pharmacy consulted to dose heparin for ACS. SQH given ~1400. Hgb 9.2. Plts 75.   Heparin level came back undetectable again. No issue with drip per Rn. Plan for 48 hr of heparin. Increase rate and check in AM.   Goal of Therapy:  Heparin level 0.3-0.7 units/ml Monitor platelets by anticoagulation protocol: Yes   Plan:  Increase heparin 1200 units/hr  No bolus per cardiology and CCM  Check AM heparin level  Monitor heparin level, CBC and s/s of bleeding daily  Watch plts closely   Onnie Boer, PharmD, BCIDP, AAHIVP, CPP Infectious Disease Pharmacist 01/14/2022 9:30 PM

## 2022-01-14 NOTE — Evaluation (Signed)
Clinical/Bedside Swallow Evaluation Patient Details  Name: Jeffrey Campbell MRN: [id number] Date of Birth: 05-25-1932  Today's Date: 01/14/2022 Time: SLP Start Time (ACUTE ONLY): 73 SLP Stop Time (ACUTE ONLY): 1300 SLP Time Calculation (min) (ACUTE ONLY): 30 min  Past Medical History:  Past Medical History:  Diagnosis Date   Anemia    low iron   BPH (benign prostatic hyperplasia)    CAD (coronary artery disease)    Carotid artery stenosis    1-39% bilateral carotid artery stenosis and < 50% stenosis in the right CCA by dopplers 06/2017   CKD (chronic kidney disease), stage III (HCC)    Stage 4   Diastolic dysfunction    Glaucoma    Graves disease    History of ETOH abuse    Hyperlipemia    Hypertension    Hyperthyroidism 08/26/10   radioactive iodine therapy    Memory loss    Mitral regurgitation echo 2015   mild   Multiple thyroid nodules    MVP (mitral valve prolapse) 11/2012   posterior MVP   OSA (obstructive sleep apnea)    upper airway resistance syndrome with RDI 18/hr - not on CPAP due to insurance not covering   Pre-diabetes    PUD (peptic ulcer disease)    Pulmonary hypertension ([place]) echo 2015   Group 2 with pulmonary venous HTN and Group 3 with OSA   Upper airway resistance syndrome    Past Surgical History:  Past Surgical History:  Procedure Laterality Date   APPENDECTOMY     AV FISTULA PLACEMENT Left 10/18/2017   Procedure: ARTERIOVENOUS (AV) FISTULA CREATION ARM;  Surgeon: Waynetta Sandy, MD;  Location: [facility];  Service: Vascular;  Laterality: Left;   CARDIAC CATHETERIZATION     CARDIAC CATHETERIZATION N/A 02/17/2015   Procedure: Right Heart Cath;  Surgeon: Larey Dresser, MD;  Location: [facility];  Service: Cardiovascular;  Laterality: N/A;   CARDIOVERSION N/A 02/08/2019   Procedure: CARDIOVERSION;  Surgeon: Donato Heinz, MD;  Location: [facility];  Service: Endoscopy;  Laterality: N/A;   ESOPHAGOGASTRODUODENOSCOPY (EGD)  WITH PROPOFOL Left 10/16/2016   Procedure: ESOPHAGOGASTRODUODENOSCOPY (EGD) WITH PROPOFOL;  Surgeon: Ronnette Juniper, MD;  Location: [facility];  Service: Gastroenterology;  Laterality: Left;   heart catherization     HERNIA REPAIR  10/2009   IR RADIOLOGIST EVAL & MGMT  09/28/2016   IR RADIOLOGIST EVAL & MGMT  10/19/2016   IR RADIOLOGIST EVAL & MGMT  01/18/2017   PACEMAKER IMPLANT N/A 02/09/2019   Procedure: PACEMAKER IMPLANT;  Surgeon: Constance Haw, MD;  Location: [facility];  Service: Cardiovascular;  Laterality: N/A;   RIGHT HEART CATH N/A 07/28/2016   Procedure: Right Heart Cath;  Surgeon: Larey Dresser, MD;  Location: [facility];  Service: Cardiovascular;  Laterality: N/A;   HPI:  88yo male presenting to ED on 11/16/21 with generalized weakness and confusion. Admitted for acute infectious encephalopathy secondary to COVID infection plus possible UTI. PMH including HTN, HLD, T2DM, hypothyroidism, COPD, OSA, HFpEF, PAD, CAD, complete heart block s/p pacemaker, mitral valve prolapse with mitral regurgitation, ESRD on MWF HD, and dementia.    Assessment / Plan / Recommendation  Clinical Impression  Pt was seen at bedside for limited swallow evaluation. Pt was extubated last night. RN notified SLP that pt was more alert than this morning at SLP's initial attempt At bedside, pt was cooperative with oral care, but had difficulty following verbal directions consistently. Oral care was  completed with suction. Pt was then given individual ice chips x3. No oral manipulation was observed, and no swallow reflex elicited after ice chips. Water was suctioned from oral cavity. No further PO presentations were given. Recommend continuing with NPO status for now. SLP will follow pt to determine readiness for PO intake. RN and MD informed.  SLP Visit Diagnosis: Dysphagia, unspecified (R13.10)    Aspiration Risk  Severe aspiration risk;Risk for inadequate nutrition/hydration    Diet  Recommendation NPO   Medication Administration: Via alternative means Postural Changes: Seated upright at 90 degrees    Other  Recommendations Oral Care Recommendations: Oral care QID;Staff/trained caregiver to provide oral care Other Recommendations: Have oral suction available    Recommendations for follow up therapy are one component of a multi-disciplinary discharge planning process, led by the attending physician.  Recommendations may be updated based on patient status, additional functional criteria and insurance authorization.  Follow up Recommendations TBD     Assistance Recommended at Discharge TBD  Functional Status Assessment Patient has had a recent decline in their functional status and/or demonstrates limited ability to make significant improvements in function in a reasonable and predictable amount of time   Frequency and Duration min 2x/week  1 week;2 weeks       Prognosis Prognosis for Safe Diet Advancement: Guarded      Swallow Study   General Date of Onset: 01/03/2022 HPI: 88yo male presenting to ED on 11/16/21 with generalized weakness and confusion. Admitted for acute infectious encephalopathy secondary to COVID infection plus possible UTI. PMH including HTN, HLD, T2DM, hypothyroidism, COPD, OSA, HFpEF, PAD, CAD, complete heart block s/p pacemaker, mitral valve prolapse with mitral regurgitation, ESRD on MWF HD, and dementia. Type of Study: Bedside Swallow Evaluation Previous Swallow Assessment: BSE June 2018 and May 2021 with recommendation for reg/thin both times. Diet Prior to this Study: NPO Temperature Spikes Noted: No Respiratory Status: Nasal cannula History of Recent Intubation: Yes Length of Intubations (days): 2 days Date extubated: 01/13/22 Behavior/Cognition: Lethargic/Drowsy;Doesn't follow directions Oral Cavity Assessment: Within Functional Limits Oral Care Completed by SLP: Yes Oral Cavity - Dentition: Adequate natural dentition (in poor  condition) Self-Feeding Abilities: Total assist Patient Positioning: Upright in bed Baseline Vocal Quality: Low vocal intensity;Breathy Volitional Cough: Cognitively unable to elicit Volitional Swallow: Unable to elicit    Oral/Motor/Sensory Function Overall Oral Motor/Sensory Function: Generalized oral weakness   Ice Chips Ice chips: Impaired Oral Phase Impairments: Reduced labial seal;Poor awareness of bolus;Impaired mastication;Reduced lingual movement/coordination Oral Phase Functional Implications: Oral holding   Thin Liquid Thin Liquid: Not tested    Nectar Thick Nectar Thick Liquid: Not tested   Honey Thick Honey Thick Liquid: Not tested   Puree Puree: Not tested   Solid     Solid: Not tested     Jamar Casagrande B. Quentin Ore, [place], [place] Speech Language Pathologist Office: [phone]  Shonna Chock 01/14/2022,2:20 PM

## 2022-01-14 NOTE — Progress Notes (Signed)
NAME:  Jeffrey Campbell, MRN:  [id number], DOB:  07-16-1932, LOS: 2 ADMISSION DATE:  01/03/2022, CONSULTATION DATE:  01/08/2022 REFERRING MD:  EDP CHIEF COMPLAINT:  AMS, ARF   History of Present Illness:  88 year old male with extensive PMH including ESRD on HD MWF. Presents to ED on 10/17 after being found unresponsive at home. Per family patient recent admission 8/23 for altered mental status, UTI. Now with reported tiredness, weakness over last 24 hours. This morning when daughter arrived at patient home, she reports patient was unresponsive making gurgling sounds, on arrival to ED patient remains minimally responsive with tachypnea and hypotension requiring intubation. CXR with left lower lobe infiltrate. CT head negative. CT C/A/P with extensive bilateral infiltirates and noted left lower lobe infiltrate concerning for aspiration. Given Cefepime/Vancomycin. Critical Care Consulted for admission.  Pertinent  Medical History  HTN, DMII, CAD, CHB h/o PPM, ESRD on HD MWF, COPD,  Significant Hospital Events: Including procedures, antibiotic start and stop dates in addition to other pertinent events    Interim History / Subjective:  Had blown ETT tube cuff and needed tube exchange but pt extubated overnight. Mag and K low and both repleted.  Review of Systems:   Negative unless stated in the subject.  Objective: Afebrile, MAP>65. Pulse in the 60s.   Blood pressure (!) 130/49, pulse 65, temperature 98.4 F (36.9 C), temperature source Axillary, resp. rate (!) 28, height '5\' 9"'$  (1.753 m), weight 56.8 kg, SpO2 99 %.      Intake/Output Summary (Last 24 hours) at 01/14/2022 0723 Last data filed at 01/14/2022 0706 Gross per 24 hour  Intake 849.66 ml  Output 660 ml  Net 189.66 ml   Filed Weights   01/13/22 0332 01/13/22 1807 01/14/22 0500  Weight: 57.6 kg 61.1 kg 56.8 kg   Intake: 655 cc Output: 500 cc Urine: 20 cc Stool: 1 occurrence; 140 cc Net: +155 Total: +1.970 L  Examination:   General: NAD HENT: Dry MMM Lungs: Rhonchi present, slight gurgling observed with suctioning  performed.  Cardiovascular: NSR, diastolic murmur present Abdomen: No TTP, soft Extremities: No asymmetery Neuro: follows commands and answers questions with one word.  GU: foley present with minimal urine.  [place]  Cardiology Nephrology Resolved Hospital Problem list    Assessment & Plan:  Septic Shock 2/2 Aspiration Pneumonia (resolved) Acute hypoxic Respiratory Failure 2/2 to Aspiration Pneumonia (resolving) Acute Metabolic encephalopathy 2/2 problem 1 and ESRD (resolving) Septic shock resolved. Pt extubated overnight. Currently on 4.5 L of [place]. Pt mental status appears to be waxing and waning which may be due to underlying dementia. Pt blood cultures with gram stain showing gram positive cocci and BCID showing strep pneumonia. Cultures pending growth. Will await growth and susceptibilities. Currently on abx.  -Continue Rocephin, continue vanc. Follow up cultures and sensitivities.  -Continue 50 cc/hr of LR until pt can tolerate oral fluid intake.  -Can transfer out of ICU improvement noted during the day. Plan for [place] with daughter today.   ESRD on HD MWF Hypokalemia Anemia of Chronic Disease Nephrology consulted. Will replete electrolytes as needed. Hgb stable at 9.2. K at 3.3. Mag 1.6. Both repleted overnight with 30 mEq of K and 1 g of mag. Pt had HD yesterday with 500 removal. Nephrology following.   Elevated troponins 2/2 to shock vs MI Hx of CHB s/p pacemaker Tropon 4,963. Echo with global LV hypokinesis. GIIIDD. Ejection fraction mildly reduced from normal. Cardiology consulted and suspect 2/2 to demand ischemia but will  give heparin gtt for 48 hours. -Cardiology following -Continue heparin gtt  Dementia Chronic. Pt on donepezil 10 mg qhs. Dementia could be causing some delirium and complicating pt status. Can hold until we get oral access.   Hypothyroidism Chronic. TSH  elevated at 5.8. Normal FT4 and T3 pending.  Pt on levothyroxine 150 mcg daily. Can hold until we get oral acess.   DMII Continue SSI. Goal 140-180. CBG 77.  Best Practice (right click and "Reselect all SmartList Selections" daily)  Diet/type: NPO DVT prophylaxis: Heparin gtt GI prophylaxis: None needed.  Lines: N/A Foley:  Yes, and it is still needed Continuous: None  Code Status:  full code Last date of multidisciplinary goals of care discussion [Plan for 01/13/22] Labs: CBC with no leukocytosis, and anemia at hgb 9 that is stable. MCV slightly elevated 100. K low at 3.3. BUN and Creatinine improved with HD. Mag low at 1.6. Elevated TSH but normal FT4.  CBC: Recent Labs  Lab 01/03/2022 1012 01/13/2022 1029 12/28/2021 1137 01/17/2022 1146 01/13/22 0604 01/14/22 0453  WBC 7.0  --   --   --  7.9 7.6  NEUTROABS 5.6  --   --   --   --   --   HGB 9.6* 9.5* 9.9* 9.9* 9.2* 9.0*  HCT 30.5* 28.0* 29.0* 29.0* 28.0* 27.9*  MCV 103.7*  --   --   --  98.9 100.4*  PLT 94*  --   --   --  75* 82*   Basic Metabolic Panel: Recent Labs  Lab 01/21/2022 1012 12/27/2021 1029 01/03/2022 1137 01/24/2022 1146 01/05/2022 1453 01/13/22 0604 01/13/22 1405 01/13/22 1654 01/14/22 0453  NA 139 138 138 139  --  138 137  --  138  K 3.3* 3.3* 3.3* 3.3*  --  3.4* 3.3*  --  3.3*  CL 98 96*  --   --   --  97* 97*  --  96*  CO2 29  --   --   --   --  24 23  --  26  GLUCOSE 67* 68*  --   --   --  66* 85  --  101*  BUN 30* 29*  --   --   --  46* 51*  --  23  CREATININE 4.66* 4.80*  --   --   --  5.59* 6.02*  --  3.31*  CALCIUM 8.5*  --   --   --   --  8.9 8.9  --  9.0  MG  --   --   --   --  1.5* 1.7  --  1.5* 1.6*  PHOS  --   --   --   --  3.8 3.9 4.3 1.7* 3.8   GFR: Estimated Creatinine Clearance: 12.9 mL/min (A) (by C-G formula based on SCr of 3.31 mg/dL (H)). Recent Labs  Lab 01/11/2022 1012 01/24/2022 1317 01/13/22 0604 01/14/22 0453  WBC 7.0  --  7.9 7.6  LATICACIDVEN 3.4* 3.8*  --   --    Liver Function  Tests: Recent Labs  Lab 01/11/2022 1012 01/13/22 1405  AST 59*  --   ALT 33  --   ALKPHOS 53  --   BILITOT 1.2  --   PROT 5.6*  --   ALBUMIN 2.8* 2.8*   No results for input(s): "LIPASE", "AMYLASE" in the last 168 hours. No results for input(s): "AMMONIA" in the last 168 hours. ABG    Component Value Date/Time  PHART 7.445 01/04/2022 1146   PCO2ART 43.4 01/11/2022 1146   PO2ART 240 (H) 01/11/2022 1146   HCO3 29.4 (H) 01/01/2022 1146   TCO2 31 01/02/2022 1146   ACIDBASEDEF 5.0 (H) 07/28/2016 0850   ACIDBASEDEF 5.0 (H) 07/28/2016 0850   O2SAT 100 01/23/2022 1146    Coagulation Profile: Recent Labs  Lab 01/03/2022 1012  INR 1.5*   Cardiac Enzymes: No results for input(s): "CKTOTAL", "CKMB", "CKMBINDEX", "TROPONINI" in the last 168 hours. HbA1C: Hgb A1c MFr Bld  Date/Time Value Ref Range Status  12/27/2021 01:17 PM 5.2 4.8 - 5.6 % Final    Comment:    (NOTE) Pre diabetes:          5.7%-6.4%  Diabetes:              >6.4%  Glycemic control for   <7.0% adults with diabetes   09/19/2016 04:07 AM 5.5 4.8 - 5.6 % Final    Comment:    (NOTE)         Pre-diabetes: 5.7 - 6.4         Diabetes: >6.4         Glycemic control for adults with diabetes: <7.0    CBG: Recent Labs  Lab 01/13/22 1112 01/13/22 1509 01/13/22 1911 01/13/22 2326 01/14/22 0324  GLUCAP 77 84 75 81 85   Past Medical History:  He,  has a past medical history of Anemia, BPH (benign prostatic hyperplasia), CAD (coronary artery disease), Carotid artery stenosis, CKD (chronic kidney disease), stage III ([place]), Diastolic dysfunction, Glaucoma, Graves disease, History of ETOH abuse, Hyperlipemia, Hypertension, Hyperthyroidism (08/26/10), Memory loss, Mitral regurgitation (echo 2015), Multiple thyroid nodules, MVP (mitral valve prolapse) (11/2012), OSA (obstructive sleep apnea), Pre-diabetes, PUD (peptic ulcer disease), Pulmonary hypertension ([place]) (echo 2015), and Upper airway resistance syndrome.  Surgical  History:   Past Surgical History:  Procedure Laterality Date   APPENDECTOMY     AV FISTULA PLACEMENT Left 10/18/2017   Procedure: ARTERIOVENOUS (AV) FISTULA CREATION ARM;  Surgeon: Waynetta Sandy, MD;  Location: [facility];  Service: Vascular;  Laterality: Left;   CARDIAC CATHETERIZATION     CARDIAC CATHETERIZATION N/A 02/17/2015   Procedure: Right Heart Cath;  Surgeon: Larey Dresser, MD;  Location: [facility];  Service: Cardiovascular;  Laterality: N/A;   CARDIOVERSION N/A 02/08/2019   Procedure: CARDIOVERSION;  Surgeon: Donato Heinz, MD;  Location: [facility];  Service: Endoscopy;  Laterality: N/A;   ESOPHAGOGASTRODUODENOSCOPY (EGD) WITH PROPOFOL Left 10/16/2016   Procedure: ESOPHAGOGASTRODUODENOSCOPY (EGD) WITH PROPOFOL;  Surgeon: Ronnette Juniper, MD;  Location: [facility];  Service: Gastroenterology;  Laterality: Left;   heart catherization     HERNIA REPAIR  10/2009   IR RADIOLOGIST EVAL & MGMT  09/28/2016   IR RADIOLOGIST EVAL & MGMT  10/19/2016   IR RADIOLOGIST EVAL & MGMT  01/18/2017   PACEMAKER IMPLANT N/A 02/09/2019   Procedure: PACEMAKER IMPLANT;  Surgeon: Constance Haw, MD;  Location: [facility];  Service: Cardiovascular;  Laterality: N/A;   RIGHT HEART CATH N/A 07/28/2016   Procedure: Right Heart Cath;  Surgeon: Larey Dresser, MD;  Location: [place] [facility];  Service: Cardiovascular;  Laterality: N/A;    Social History:   reports that he quit smoking about 37 years ago. His smoking use included cigarettes. He has a 80.00 pack-year smoking history. He has never used smokeless tobacco. He reports that he does not drink alcohol and does not use drugs.  Family History:  His family  history includes Colon cancer in his brother, maternal uncle, and mother; Hypertension in his father; Kidney failure in his father; Lung disease in his brother.  Allergies Allergies  Allergen Reactions   Cardura [Doxazosin] Shortness Of Breath and Other (See  Comments)    Dizziness    Hydrochlorothiazide Shortness Of Breath and Other (See Comments)    Dizziness    Iodinated Contrast Media Other (See Comments)    "shut down his kidneys" Patient has stage III chronic kidney disease   Zocor [Simvastatin] Other (See Comments)    Kidney problems    Protonix [Pantoprazole] Other (See Comments)    Worsening kidney problems   Rocaltrol [Calcitriol] Other (See Comments)    Unknown reaction   Budesonide-Formoterol Fumarate Other (See Comments)    Dry mouth   Minodyl [Minoxidil] Other (See Comments)    Unknown raction   Tiotropium Bromide Monohydrate Other (See Comments)    Dry mouth   Tricor [Fenofibrate]     Unknown reaction    Home Medications  Prior to Admission medications   Medication Sig Start Date End Date Taking? Authorizing Provider  atorvastatin (LIPITOR) 20 MG tablet TAKE 1 BY MOUTH DAILY ABSOLUTE LAST REFILL WITHOUT OFFICE VISIT PLEASE CALL [phone] TO SCHEDULE Patient taking differently: Take 20 mg by mouth daily. 11/09/21  Yes Larey Dresser, MD  Brinzolamide-Brimonidine [facility]) 1-0.2 % SUSP Place 1 drop into both eyes 2 (two) times daily.    Yes [provider]  calcium acetate (PHOSLO) 667 MG capsule Take 667 mg by mouth See admin instructions. 667 mg 3 times daily with meals, 667 mg 2 times daily with snacks. 11/30/19  Yes [provider]  donepezil (ARICEPT) 10 MG tablet Take 1 tablet (10 mg total) by mouth at bedtime. 08/17/21  Yes Suzzanne Cloud, NP  doxazosin (CARDURA) 4 MG tablet Take 1 tablet (4 mg total) by mouth daily. Patient taking differently: Take 4 mg by mouth 2 (two) times daily. 02/12/19  Yes Simmons, Brittainy M, PA-C  levothyroxine (SYNTHROID, LEVOTHROID) 150 MCG tablet Take 150 mcg by mouth daily before breakfast.   Yes [provider]  memantine (NAMENDA) 10 MG tablet TAKE 1 TABLET BY MOUTH TWICE A DAY Patient taking differently: Take 10 mg by mouth 2 (two) times daily. 01/19/21   Yes Suzzanne Cloud, NP  multivitamin (RENA-VIT) TABS tablet Take 1 tablet by mouth daily. 11/01/19  Yes [provider]     Idamae Schuller, MD [facility]ie [facility]. [facility] Internal Medicine Residency, PGY-2

## 2022-01-14 NOTE — Progress Notes (Signed)
ANTICOAGULATION CONSULT NOTE - Initial Consult  Pharmacy Consult for Heparin Indication: chest pain/ACS  Allergies  Allergen Reactions   Cardura [Doxazosin] Shortness Of Breath and Other (See Comments)    Dizziness    Hydrochlorothiazide Shortness Of Breath and Other (See Comments)    Dizziness    Iodinated Contrast Media Other (See Comments)    "shut down his kidneys" Patient has stage III chronic kidney disease   Zocor [Simvastatin] Other (See Comments)    Kidney problems    Protonix [Pantoprazole] Other (See Comments)    Worsening kidney problems   Rocaltrol [Calcitriol] Other (See Comments)    Unknown reaction   Budesonide-Formoterol Fumarate Other (See Comments)    Dry mouth   Minodyl [Minoxidil] Other (See Comments)    Unknown raction   Tiotropium Bromide Monohydrate Other (See Comments)    Dry mouth   Tricor [Fenofibrate]     Unknown reaction    Patient Measurements: Height: '5\' 9"'$  (175.3 cm) Weight: 56.8 kg (125 lb 3.5 oz) IBW/kg (Calculated) : 70.7 Heparin Dosing Weight: 55kg   Vital Signs: Temp: 98.3 F (36.8 C) (10/19 1200) Temp Source: Oral (10/19 1200) BP: 123/67 (10/19 1000) Pulse Rate: 66 (10/19 1000)  Labs: Recent Labs    01/13/2022 1012 01/17/2022 1029 01/19/2022 1146 01/02/2022 1317 01/04/2022 1453 01/13/22 0604 01/13/22 1405 01/14/22 0011 01/14/22 0453 01/14/22 1010  HGB 9.6*   < > 9.9*  --   --  9.2*  --   --  9.0*  --   HCT 30.5*   < > 29.0*  --   --  28.0*  --   --  27.9*  --   PLT 94*  --   --   --   --  75*  --   --  82*  --   APTT 42*  --   --   --   --   --   --   --   --   --   LABPROT 17.7*  --   --   --   --   --   --   --   --   --   INR 1.5*  --   --   --   --   --   --   --   --   --   HEPARINUNFRC  --   --   --   --   --   --   --  <0.10*  --  <0.10*  CREATININE 4.66*   < >  --   --   --  5.59* 6.02*  --  3.31*  --   TROPONINIHS  --   --   --  2,957* 7,026*  --   --   --   --   --    < > = values in this interval not displayed.      Estimated Creatinine Clearance: 12.9 mL/min (A) (by C-G formula based on SCr of 3.31 mg/dL (H)).   Medical History: Past Medical History:  Diagnosis Date   Anemia    low iron   BPH (benign prostatic hyperplasia)    CAD (coronary artery disease)    Carotid artery stenosis    1-39% bilateral carotid artery stenosis and < 50% stenosis in the right CCA by dopplers 06/2017   CKD (chronic kidney disease), stage III (H[facility])    Stage 4   Diastolic dysfunction    Glaucoma    Graves disease    History  of ETOH abuse    Hyperlipemia    Hypertension    Hyperthyroidism 08/26/10   radioactive iodine therapy    Memory loss    Mitral regurgitation echo 2015   mild   Multiple thyroid nodules    MVP (mitral valve prolapse) 11/2012   posterior MVP   OSA (obstructive sleep apnea)    upper airway resistance syndrome with RDI 18/hr - not on CPAP due to insurance not covering   Pre-diabetes    PUD (peptic ulcer disease)    Pulmonary hypertension ([place]) echo 2015   Group 2 with pulmonary venous HTN and Group 3 with OSA   Upper airway resistance syndrome    Assessment: 88 yo male presented with sepsis found to have strep pneumoniae bacteremia. Pharmacy consulted to dose heparin for ACS. SQH given ~1400. Hgb 9.2. Plts 75.   Heparin level undetectable.  Goal of Therapy:  Heparin level 0.3-0.7 units/ml Monitor platelets by anticoagulation protocol: Yes   Plan:  Increase heparin 1000 units/hr  No bolus per cardiology and CCM  Check 8 hr heparin level  Monitor heparin level, CBC and s/s of bleeding daily  Watch plts closely   Alanda Slim, PharmD, [facility] Clinical Pharmacist Please see AMION for all Pharmacists' Contact Phone Numbers 01/14/2022, 12:27 PM

## 2022-01-14 NOTE — Progress Notes (Signed)
Rounding Note    Patient Name: [name] Date of Encounter: 01/14/2022  [facility] Cardiologist: Fransico Him, MD   Subjective   Extubated yesterday.  Hgb stable at 9.0, Plts 82.  Oriented x2.  Denies chest pain  Inpatient Medications    Scheduled Meds:  atorvastatin  20 mg Per Tube Daily   [START ON 01/15/2022] calcitRIOL  0.5 mcg Oral Q M,W,F-HD   Chlorhexidine Gluconate Cloth  6 each Topical Daily   Chlorhexidine Gluconate Cloth  6 each Topical Q0600   cinacalcet  90 mg Oral Q supper   [START ON 01/15/2022] darbepoetin (ARANESP) injection - DIALYSIS  100 mcg Intravenous Q Fri-HD   docusate  100 mg Per Tube BID   donepezil  10 mg Per Tube QHS   levothyroxine  150 mcg Per Tube Q0600   multivitamin  1 tablet Per Tube QHS   pantoprazole  40 mg Per Tube Daily   polyethylene glycol  17 g Per Tube Daily   Continuous Infusions:  cefTRIAXone (ROCEPHIN)  IV Stopped (01/13/22 1118)   heparin 850 Units/hr (01/14/22 0706)   magnesium sulfate bolus IVPB     potassium chloride 10 mEq (01/14/22 0838)   vancomycin Stopped (01/13/22 1802)   PRN Meds: docusate, mouth rinse, polyethylene glycol   Vital Signs    Vitals:   01/14/22 0430 01/14/22 0500 01/14/22 0530 01/14/22 0800  BP: (!) 130/49 (!) 133/53 (!) 130/49   Pulse: 71 78 65   Resp: (!) 26 (!) 26 (!) 28   Temp:    97.7 F (36.5 C)  TempSrc:    Oral  SpO2: 99% 100% 99%   Weight:  56.8 kg    Height:        Intake/Output Summary (Last 24 hours) at 01/14/2022 0929 Last data filed at 01/14/2022 0706 Gross per 24 hour  Intake 833.87 ml  Output 660 ml  Net 173.87 ml      01/14/2022    5:00 AM 01/13/2022    6:07 PM 01/13/2022    3:32 AM  Last 3 Weights  Weight (lbs) 125 lb 3.5 oz 134 lb 11.2 oz 126 lb 15.8 oz  Weight (kg) 56.8 kg 61.1 kg 57.6 kg      Telemetry    V-paced - Personally Reviewed  ECG    No new ECG - Personally Reviewed  Physical Exam   GEN: Chronically ill appearing Neck:  No JVD Cardiac: RRR, no murmurs Respiratory: Diminished breath sounds GI: Soft, nontender MS: No edema Neuro:  Oriented x2 Psych: Normal affect   Labs    High Sensitivity Troponin:   Recent Labs  Lab 12/27/2021 1317 01/08/2022 1453  TROPONINIHS 2,957* 4,963*     Chemistry Recent Labs  Lab 01/23/2022 1012 01/11/2022 1029 01/13/22 0604 01/13/22 1405 01/13/22 1654 01/14/22 0453  NA 139   < > 138 137  --  138  K 3.3*   < > 3.4* 3.3*  --  3.3*  CL 98   < > 97* 97*  --  96*  CO2 29  --  24 23  --  26  GLUCOSE 67*   < > 66* 85  --  101*  BUN 30*   < > 46* 51*  --  23  CREATININE 4.66*   < > 5.59* 6.02*  --  3.31*  CALCIUM 8.5*  --  8.9 8.9  --  9.0  MG  --    < > 1.7  --  1.5*  1.6*  PROT 5.6*  --   --   --   --   --   ALBUMIN 2.8*  --   --  2.8*  --   --   AST 59*  --   --   --   --   --   ALT 33  --   --   --   --   --   ALKPHOS 53  --   --   --   --   --   BILITOT 1.2  --   --   --   --   --   GFRNONAA 12*  --  9* 9*  --  17*  ANIONGAP 12  --  17* 17*  --  16*   < > = values in this interval not displayed.    Lipids  Recent Labs  Lab 01/13/22 0604  TRIG 111    Hematology Recent Labs  Lab 01/01/2022 1012 01/16/2022 1029 01/15/2022 1146 01/13/22 0604 01/14/22 0453  WBC 7.0  --   --  7.9 7.6  RBC 2.94*  --   --  2.83* 2.78*  HGB 9.6*   < > 9.9* 9.2* 9.0*  HCT 30.5*   < > 29.0* 28.0* 27.9*  MCV 103.7*  --   --  98.9 100.4*  MCH 32.7  --   --  32.5 32.4  MCHC 31.5  --   --  32.9 32.3  RDW 14.9  --   --  15.0 14.9  PLT 94*  --   --  75* 82*   < > = values in this interval not displayed.   Thyroid  Recent Labs  Lab 01/01/2022 1012 01/13/22 1405  TSH 5.816*  --   FREET4  --  1.00    BNP Recent Labs  Lab 01/02/2022 1012  BNP 1,904.9*    DDimer No results for input(s): "DDIMER" in the last 168 hours.   Radiology    DG Abd Portable 1V  Result Date: 01/13/2022 CLINICAL DATA:  OG tube placement EXAM: PORTABLE ABDOMEN - 1 VIEW COMPARISON:  01/13/2022 FINDINGS: OG  tube tip in the mid to distal elongated stomach. No evidence of bowel obstruction. IMPRESSION: OG tube tip in the mid to distal stomach. Electronically Signed   By: Rolm Baptise M.D.   On: 01/13/2022 19:18   DG Abd Portable 1V  Result Date: 01/13/2022 CLINICAL DATA:  Encounter for orogastric tube placement. EXAM: PORTABLE ABDOMEN - 1 VIEW COMPARISON:  Radiograph earlier today. FINDINGS: The enteric tube tip and side port are below the diaphragm in the stomach, tip in the region of the gastric body. Nonobstructive upper abdominal bowel gas pattern. Pacemaker wires partially included. Bibasilar lung opacities, left-greater-than-right, better assessed on yesterday's CT. IMPRESSION: Enteric tube tip and side-port in the stomach, tip in the region of the gastric body. Electronically Signed   By: Keith Rake M.D.   On: 01/13/2022 18:55   DG Abd Portable 1V  Result Date: 01/13/2022 CLINICAL DATA:  Enteric tube placement in the setting of acute hypoxic respiratory failure requiring intubation and mechanical ventilation EXAM: PORTABLE ABDOMEN - 1 VIEW COMPARISON:  CT abdomen and pelvis dated 12/31/2021 FINDINGS: Enteric tube tip projects over the distal stomach/proximal duodenum Pacemaker wires terminate over the right atrium and right ventricle. Paucity of bowel gas. Bibasilar patchy opacities, better evaluated on prior CT. IMPRESSION: Enteric tube tip projects over the distal stomach/proximal duodenum. Electronically Signed   By: Shawn Route.D.  On: 01/13/2022 16:22   ECHOCARDIOGRAM COMPLETE  Result Date: 01/21/2022    ECHOCARDIOGRAM REPORT   Patient Name:   CLOYCE BLANKENHORN Date of Exam: 12/30/2021 Medical Rec #:  [id number]     Height:       69.0 in Accession #:    [id number]    Weight:       123.2 lb Date of Birth:  1932/08/01     BSA:          1.682 m Patient Age:    88 years      BP:           95/65 mmHg Patient Gender: M             HR:           60 bpm. Exam Location:  Inpatient Procedure: 2D Echo,  Color Doppler and Cardiac Doppler Indications:    R06.9 DOE  History:        Patient has prior history of Echocardiogram examinations, most                 recent 01/05/2019. CHF, CAD, COPD; Risk Factors:Hypertension,                 Diabetes, Dyslipidemia, Sleep Apnea and Former Smoker.  Sonographer:    Raquel Sarna Senior RDCS Referring Phys: [id number] Omar Person  Sonographer Comments: Scanned supine on artificial respirator. IMPRESSIONS  1. Left ventricular ejection fraction, by estimation, is 45 to 50%. Left ventricular ejection fraction by 2D MOD biplane is 46.3 %. The left ventricle has mildly decreased function. The left ventricle demonstrates global hypokinesis. There is moderate left ventricular hypertrophy. Left ventricular diastolic parameters are consistent with Grade III diastolic dysfunction (restrictive). Elevated left ventricular end-diastolic pressure.  2. Right ventricular systolic function is moderately reduced. The right ventricular size is mildly enlarged. There is moderately elevated pulmonary artery systolic pressure. The estimated right ventricular systolic pressure is 35.0 mmHg.  3. Left atrial size was severely dilated.  4. Right atrial size was severely dilated.  5. The mitral valve is abnormal. Mild mitral valve regurgitation.  6. The tricuspid valve is abnormal. Tricuspid valve regurgitation is mild to moderate.  7. The aortic valve is tricuspid. Aortic valve regurgitation is trivial. Aortic valve sclerosis is present, with no evidence of aortic valve stenosis.  8. Mildly dilated pulmonary artery.  9. The inferior vena cava is dilated in size with <50% respiratory variability, suggesting right atrial pressure of 15 mmHg. Comparison(s): Changes from prior study are noted. 01/05/2019: LVEF 55-60%. FINDINGS  Left Ventricle: Left ventricular ejection fraction, by estimation, is 45 to 50%. Left ventricular ejection fraction by 2D MOD biplane is 46.3 %. The left ventricle has mildly decreased  function. The left ventricle demonstrates global hypokinesis. The left ventricular internal cavity size was normal in size. There is moderate left ventricular hypertrophy. Abnormal (paradoxical) septal motion, consistent with left bundle branch block. Left ventricular diastolic parameters are consistent with Grade III diastolic dysfunction (restrictive). Elevated left ventricular end-diastolic pressure. Right Ventricle: The right ventricular size is mildly enlarged. No increase in right ventricular wall thickness. Right ventricular systolic function is moderately reduced. There is moderately elevated pulmonary artery systolic pressure. The tricuspid regurgitant velocity is 2.78 m/s, and with an assumed right atrial pressure of 15 mmHg, the estimated right ventricular systolic pressure is 09.3 mmHg. Left Atrium: Left atrial size was severely dilated. Right Atrium: Right atrial size was severely dilated. Pericardium: There is no evidence of pericardial  effusion. Mitral Valve: The mitral valve is abnormal. There is mild thickening of the anterior and posterior mitral valve leaflet(s). Mild mitral valve regurgitation. Tricuspid Valve: The tricuspid valve is abnormal. Tricuspid valve regurgitation is mild to moderate. Aortic Valve: The aortic valve is tricuspid. Aortic valve regurgitation is trivial. Aortic valve sclerosis is present, with no evidence of aortic valve stenosis. Pulmonic Valve: The pulmonic valve was grossly normal. Pulmonic valve regurgitation is trivial. Aorta: The aortic root and ascending aorta are structurally normal, with no evidence of dilitation. Pulmonary Artery: The pulmonary artery is mildly dilated. Venous: The inferior vena cava is dilated in size with less than 50% respiratory variability, suggesting right atrial pressure of 15 mmHg. IAS/Shunts: No atrial level shunt detected by color flow Doppler. Additional Comments: There is pleural effusion in the left lateral region.  LEFT VENTRICLE PLAX  2D                        Biplane EF (MOD) LVIDd:         3.50 cm         LV Biplane EF:   Left LVIDs:         2.60 cm                          ventricular LV PW:         1.60 cm                          ejection LV IVS:        1.30 cm                          fraction by LVOT diam:     2.00 cm                          2D MOD LV SV:         62                               biplane is LV SV Index:   37                               46.3 %. LVOT Area:     3.14 cm                                Diastology                                LV e' medial:    6.96 cm/s LV Volumes (MOD)               LV E/e' medial:  14.2 LV vol d, MOD    110.0 ml      LV e' lateral:   9.14 cm/s A2C:                           LV E/e' lateral: 10.8 LV vol d, MOD    125.0 ml A4C: LV vol s, MOD    56.6 ml A2C:  LV vol s, MOD    73.5 ml A4C: LV SV MOD A2C:   53.4 ml LV SV MOD A4C:   125.0 ml LV SV MOD BP:    53.4 ml RIGHT VENTRICLE RV S prime:     6.96 cm/s TAPSE (M-mode): 1.1 cm LEFT ATRIUM              Index        RIGHT ATRIUM           Index LA diam:        3.90 cm  2.32 cm/m   RA Area:     31.00 cm LA Vol (A2C):   137.0 ml 81.46 ml/m  RA Volume:   115.00 ml 68.38 ml/m LA Vol (A4C):   82.1 ml  48.82 ml/m LA Biplane Vol: 111.0 ml 66.00 ml/m  AORTIC VALVE LVOT Vmax:   110.00 cm/s LVOT Vmean:  77.900 cm/s LVOT VTI:    0.196 m  AORTA Ao Root diam: 3.30 cm Ao Asc diam:  2.90 cm MITRAL VALVE               TRICUSPID VALVE MV Area (PHT): 3.37 cm    TR Peak grad:   30.9 mmHg MV Decel Time: 225 msec    TR Vmax:        278.00 cm/s MV E velocity: 98.50 cm/s MV A velocity: 43.90 cm/s  SHUNTS MV E/A ratio:  2.24        Systemic VTI:  0.20 m                            Systemic Diam: 2.00 cm Lyman Bishop MD Electronically signed by Lyman Bishop MD Signature Date/Time: 01/16/2022/6:17:08 PM    Final    EEG adult  Result Date: 01/13/2022 Lora Havens, MD     01/13/2022 10:31 AM Patient Name: JORAN KALLAL MRN: [id number] Epilepsy Attending:  Lora Havens Referring Physician/Provider: Kipp Brood, MD Date: 01/24/2022 Duration: 22.24 mins Patient history: 88 year old male with altered mental status.  EEG to evaluate for seizure. Level of alertness: Awake AEDs during EEG study: None Technical aspects: This EEG study was done with scalp electrodes positioned according to the 10-20 International system of electrode placement. Electrical activity was reviewed with band pass filter of 1-'70Hz'$ , sensitivity of 7 uV/mm, display speed of 89m/sec with a '60Hz'$  notched filter applied as appropriate. EEG data were recorded continuously and digitally stored.  Video monitoring was available and reviewed as appropriate. Description: No clear posterior dominant rhythm was seen. EEG showed continuous generalized predominantly 5 to 6 Hz theta slowing admixed with intermittent generalized 2-'3Hz'$  delta slowing.  Hyperventilation and photic stimulation were not performed.   ABNORMALITY - Continuous slow, generalized IMPRESSION: This study is suggestive of moderate diffuse encephalopathy, nonspecific etiology. No seizures or epileptiform discharges were seen throughout the recording. PLora Havens  CT CHEST ABDOMEN PELVIS W CONTRAST  Result Date: 01/22/2022 CLINICAL DATA:  Sepsis.  Found down. EXAM: CT CHEST, ABDOMEN, AND PELVIS WITH CONTRAST TECHNIQUE: Multidetector CT imaging of the chest, abdomen and pelvis was performed following the standard protocol during bolus administration of intravenous contrast. RADIATION DOSE REDUCTION: This exam was performed according to the departmental dose-optimization program which includes automated exposure control, adjustment of the mA and/or kV according to patient size and/or use of iterative reconstruction technique. CONTRAST:  759mOMNIPAQUE IOHEXOL 350 MG/ML SOLN COMPARISON:  Chest x-ray, same date. FINDINGS:  CT CHEST FINDINGS Cardiovascular: The heart is mildly enlarged. No pericardial effusion. The pacer wires are in  good position without complicating features. Advanced aortic and coronary artery calcifications. 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 Recommend non urgent thoracic surgery consultation. No aortic dissection. Stable three-vessel coronary artery calcifications. Mediastinum/Nodes: Scattered mediastinal hilar lymph nodes likely reactive given lung findings. There is an NG tube coursing down the esophagus and into the stomach. The endotracheal tube is in good position at the mid tracheal level. Lungs/Pleura: Extensive bilateral pulmonary infiltrates with dense left lower lobe airspace consolidation. Findings highly suspicious for aspiration. Underlying emphysematous changes and pulmonary scarring. Small bilateral pleural effusions. Musculoskeletal: No significant bony findings. The sternum, ribs and thoracic vertebral bodies are intact. CT ABDOMEN PELVIS FINDINGS Hepatobiliary: No hepatic lesions or intrahepatic biliary dilatation. Small blush of contrast enhancement in the left hepatic lobe not present on delayed images. This is likely a small vascular shunt. Gallbladder is grossly normal. It is surrounded by fluid. No common bile duct dilatation. Pancreas: No mass, inflammation or ductal dilatation. Spleen: Normal size.  No focal lesions. Adrenals/Urinary Tract: The adrenal glands and kidneys are unremarkable. Moderate renal cortical thinning on the right side but no worrisome renal lesions or hydronephrosis. The bladder contains a Foley catheter. Stomach/Bowel: The stomach, duodenum, small bowel and colon are grossly normal. No obvious acute inflammatory process, mass lesions or obstructive findings. Vascular/Lymphatic: Advanced atherosclerotic calcifications involving the aorta and branch vessels but no aneurysm or dissection. The major venous structures are patent. No mesenteric or retroperitoneal mass or adenopathy. Reproductive: The  prostate gland and seminal vesicles are unremarkable. Other: Small amount of free abdominal and free pelvic fluid along with diffuse body wall edema suggesting anasarca. Right inguinal hernia contains small bowel loops. I do not see any findings for incarceration or obstruction. Musculoskeletal: No significant bony findings. No spine or pelvic fractures are identified. The pubic symphysis and SI joints are intact. IMPRESSION: 1. Extensive bilateral pulmonary infiltrates with dense left lower lobe airspace consolidation. Findings highly suspicious for aspiration pneumonia. 2. Small bilateral pleural effusions. 3. No acute abdominal/pelvic findings. 4. Small amount of free abdominal and free pelvic fluid along with diffuse body wall edema suggesting anasarca. 5. Right inguinal hernia contains small bowel loops. I do not see any findings for incarceration or obstruction. 6. Advanced atherosclerotic calcifications involving the thoracic and abdominal aorta and branch vessels including the coronary arteries. 7. 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 Recommend non urgent thoracic surgery consultation. Electronically Signed   By: Marijo Sanes M.D.   On: 01/26/2022 13:07   CT Head Wo Contrast  Result Date: 01/11/2022 CLINICAL DATA:  Delirium.  Found down this morning. EXAM: CT HEAD WITHOUT CONTRAST TECHNIQUE: Contiguous axial images were obtained from the base of the skull through the vertex without intravenous contrast. RADIATION DOSE REDUCTION: This exam was performed according to the departmental dose-optimization program which includes automated exposure control, adjustment of the mA and/or kV according to patient size and/or use of iterative reconstruction technique. COMPARISON:  Head CT 11/16/2021 FINDINGS: Brain: Stable age related advanced cerebral atrophy, ventriculomegaly and periventricular white matter disease. No extra-axial fluid  collections are identified. No CT findings for acute hemispheric infarction or intracranial hemorrhage. No mass lesions. The brainstem and cerebellum are normal. Vascular: Stable vascular calcifications.  No  hyperdense vessels. Skull: No skull fracture or bone lesions. Sinuses/Orbits: The paranasal sinuses and mastoid air cells are clear. The globes are intact. Other: No scalp lesions or scalp hematoma. IMPRESSION: 1. Stable age related advanced cerebral atrophy, ventriculomegaly and periventricular white matter disease. 2. No acute intracranial findings or skull fracture. Electronically Signed   By: Marijo Sanes M.D.   On: 01/26/2022 12:30   DG Chest Portable 1 View  Result Date: 01/24/2022 CLINICAL DATA:  88 year old male intubated. EXAM: PORTABLE CHEST 1 VIEW COMPARISON:  Portable chest 1021 hours today and earlier. FINDINGS: Portable AP semi upright view at 1138 hours. Endotracheal tube tip in good position between the level the clavicles and carina. Enteric tube courses to the abdomen, side hole is near the expected level of the GEJ at or just below the diaphragm. Stable lung volumes. Stable cardiomegaly and mediastinal contours. Right chest dual lead cardiac pacemaker. Left greater than right dense peribronchial and lower lobe opacity, no definite air bronchograms. Left pleural effusion is not excluded. Pulmonary vascularity does not appear significantly changed since August. No pneumothorax. Calcified aortic atherosclerosis. No acute osseous abnormality identified. IMPRESSION: 1. Endotracheal tube tip in good position. Enteric tube side hole near the GEJ, advanced 5 cm to ensure side hole placement inside the stomach. 2. Stable ventilation with confluent lower lung left > right opacity. Left pleural effusion is possible. No overt edema. Electronically Signed   By: Genevie Ann M.D.   On: 12/29/2021 11:49   DG Chest Port 1 View  Result Date: 01/09/2022 CLINICAL DATA:  Questionable sepsis EXAM: PORTABLE  CHEST 1 VIEW COMPARISON:  11/16/2021 FINDINGS: Indistinct airspace disease on the left more than right. Cardiomegaly with dual-chamber pacer leads from the right. No definite effusion, although possible on the left. No pneumothorax. IMPRESSION: Bilateral airspace disease which could be edema or pneumonia. A similar appearance was seen 11/16/2021 Electronically Signed   By: Jorje Guild M.D.   On: 12/27/2021 10:30    Cardiac Studies     Patient Profile     88 y.o. male with a history of CAD, ESRD, PAD, COPD, chronic diastolic heart failure, pulmonary hypertension, dementia, OSA, hypertension, persistent atrial fibrillation, complete heart block status post PPM who we are consulted by Dr. Tacy Learn for evaluation of troponin elevation  Assessment & Plan    Elevated troponin: troponin elevation to 4963.  Suspect demand ischemia in setting of spetic shock and acute hypoxic respiratory failure, but likely in setting of underlying obstructive CAD (cath from 2012 showed 50 to 60% ostial LAD, 50 to 60% mid LAD, 60% proximal RCA stenosis).  Echo 10/17 showed EF 45-50%, global hypokinesis, G3DD, moderate RV dysfunction, severe biatrial enlargement -Continue heparin gtt x 48 hours -Continue ASA, statin -Given age and comorbidities including dementia and considering only mild systolic dysfunction on echo, would plan medical management unless having issues with angina as he recovers from acute illness.  Acute systolic heart failure: EF 45-50% on echo with global hypokinesis, suspect due to sepsis-induced cardiomyopathy but ischemia on differential as above -Add GDMT as BP tolerates  Septic shock: due to aspiration pneumonia.  Resolved, now off pressors.  Abx per primary team  ESRD: on HD  For questions or updates, please contact [place] Please consult [email or web address] for contact info under        Signed, Donato Heinz, MD  01/14/2022, 9:29 AM

## 2022-01-14 NOTE — Progress Notes (Signed)
ANTICOAGULATION CONSULT NOTE  Pharmacy Consult for Heparin Indication: chest pain/ACS Brief A/P: Heparin level subtherapeutic Increase Heparin rate   Allergies  Allergen Reactions   Cardura [Doxazosin] Shortness Of Breath and Other (See Comments)    Dizziness    Hydrochlorothiazide Shortness Of Breath and Other (See Comments)    Dizziness    Iodinated Contrast Media Other (See Comments)    "shut down his kidneys" Patient has stage III chronic kidney disease   Zocor [Simvastatin] Other (See Comments)    Kidney problems    Protonix [Pantoprazole] Other (See Comments)    Worsening kidney problems   Rocaltrol [Calcitriol] Other (See Comments)    Unknown reaction   Budesonide-Formoterol Fumarate Other (See Comments)    Dry mouth   Minodyl [Minoxidil] Other (See Comments)    Unknown raction   Tiotropium Bromide Monohydrate Other (See Comments)    Dry mouth   Tricor [Fenofibrate]     Unknown reaction    Patient Measurements: Height: '5\' 9"'$  (175.3 cm) Weight: 61.1 kg (134 lb 11.2 oz) IBW/kg (Calculated) : 70.7  Vital Signs: Temp: 99 F (37.2 C) (10/18 2327) Temp Source: Axillary (10/18 2327) BP: 149/82 (10/18 2300) Pulse Rate: 60 (10/18 2300)  Labs: Recent Labs    01/20/2022 1012 01/10/2022 1029 01/17/2022 1137 01/02/2022 1146 01/07/2022 1317 01/25/2022 1453 01/13/22 0604 01/13/22 1405 01/14/22 0011  HGB 9.6* 9.5* 9.9* 9.9*  --   --  9.2*  --   --   HCT 30.5* 28.0* 29.0* 29.0*  --   --  28.0*  --   --   PLT 94*  --   --   --   --   --  75*  --   --   APTT 42*  --   --   --   --   --   --   --   --   LABPROT 17.7*  --   --   --   --   --   --   --   --   INR 1.5*  --   --   --   --   --   --   --   --   HEPARINUNFRC  --   --   --   --   --   --   --   --  <0.10*  CREATININE 4.66* 4.80*  --   --   --   --  5.59* 6.02*  --   TROPONINIHS  --   --   --   --  2,957* 1,779*  --   --   --      Estimated Creatinine Clearance: 7.6 mL/min (A) (by C-G formula based on SCr of 6.02  mg/dL (H)).  Assessment: 88 y.o. male with elevated troponin and possible ACS for heparin  Goal of Therapy:  Heparin level 0.3-0.7 units/ml Monitor platelets by anticoagulation protocol: Yes   Plan:  Increase Heparin 850 units/hr Check heparin level in 8 hours.   Phillis Knack, PharmD, BCPS  01/14/2022 1:00 AM

## 2022-01-14 NOTE — Evaluation (Signed)
Physical Therapy Evaluation Patient Details Name: Jeffrey Campbell MRN: [id number] DOB: [birth year] Today's Date: 01/14/2022  History of Present Illness  Jeffrey Campbell is a 88 y.o. male who presents to emergency department 10/17 for evaluation of altered mental status.  Patient recently discharged on 11/18/2021 for altered mental status and UTI and has been doing well at home with home health.  On arrival to ED, patient arrives hypotensive, responsive to painful stimuli only with gurgling respirations.  VDRF10/17-10/18.   PMH HTN, HLD, T2DM, hypothyroidism, COPD, OSA, CHF, PAD, CAD with complete heart block status post pacemaker placement, mitral prolapse with mitral regurgitation, ESRD on hemodialysis Monday Wednesday Friday  Clinical Impression  Pt admitted with above diagnosis. Pt was able to stand at EOB with min assist of 2 to be cleaned as he has loose stools even with flexi seal.  Fatigues quickly.  If pt has 24 hour care, may be able to go home but if he doesn't would benefit from SNF for endurance training. Noted recent admit as well. Will follow acutely.  Pt currently with functional limitations due to the deficits listed below (see PT Problem List). Pt will benefit from skilled PT to increase their independence and safety with mobility to allow discharge to the venue listed below.          Recommendations for follow up therapy are one component of a multi-disciplinary discharge planning process, led by the attending physician.  Recommendations may be updated based on patient status, additional functional criteria and insurance authorization.  Follow Up Recommendations Skilled nursing-short term rehab (<3 hours/day) Can patient physically be transported by private vehicle: Yes    Assistance Recommended at Discharge Frequent or constant Supervision/Assistance  Patient can return home with the following  A lot of help with walking and/or transfers;A lot of help with  bathing/dressing/bathroom;Assistance with cooking/housework;Assist for transportation;Help with stairs or ramp for entrance    Equipment Recommendations Rolling walker (2 wheels);BSC/3in1  Recommendations for Other Services       Functional Status Assessment Patient has had a recent decline in their functional status and demonstrates the ability to make significant improvements in function in a reasonable and predictable amount of time.     Precautions / Restrictions Precautions Precautions: Fall Precaution Comments: flexi seal Restrictions Weight Bearing Restrictions: No      Mobility  Bed Mobility Overal bed mobility: Needs Assistance Bed Mobility: Supine to Sit, Sit to Supine     Supine to sit: Mod assist, HOB elevated, +2 for physical assistance Sit to supine: Max assist, +2 for physical assistance   General bed mobility comments: assist for LEs and elevation of trunk. Bed soiled therefore nurse came in to help clean pt and bed.    Transfers Overall transfer level: Needs assistance Equipment used: Rolling walker (2 wheels) Transfers: Sit to/from Stand Sit to Stand: Mod assist, +2 physical assistance, From elevated surface           General transfer comment: Pt needed mod assist to stand. Able to stand with min assist to be cleaned and to change bed. Took a few steps to [facility] as well with RW and min assist. PT very fatigued and nurse asked to just let him lie back down.    Ambulation/Gait                  Stairs            Wheelchair Mobility    Modified Rankin (Stroke Patients Only)  Balance Overall balance assessment: Needs assistance Sitting-balance support: Bilateral upper extremity supported, Feet supported, Single extremity supported, No upper extremity supported Sitting balance-Leahy Scale: Poor Sitting balance - Comments: relies on at least 1 UE support   Standing balance support: Bilateral upper extremity supported, During  functional activity Standing balance-Leahy Scale: Poor Standing balance comment: relies on extneral and UE support                             Pertinent Vitals/Pain      Home Living Family/patient expects to be discharged to:: Private residence Living Arrangements: Children;Other (Comment) Available Help at Discharge: Family;Available PRN/intermittently Type of Home: House Home Access: Level entry       Home Layout: One level Home Equipment: Cane - single point;Shower seat Additional Comments: Daughter stays part time and other time he has aides that come occasionally per notes. Unsure of home information as pt unable to report    Prior Function Prior Level of Function : Patient poor historian/Family not available             Mobility Comments: Pt reports he has used a RW before but unsure if he currently uses. ADLs Comments: Pt unable to report     Hand Dominance   Dominant Hand: Right    Extremity/Trunk Assessment   Upper Extremity Assessment Upper Extremity Assessment: Defer to OT evaluation    Lower Extremity Assessment Lower Extremity Assessment: Generalized weakness;Difficult to assess due to impaired cognition    Cervical / Trunk Assessment Cervical / Trunk Assessment: Kyphotic  Communication   Communication: HOH  Cognition Arousal/Alertness: Lethargic Behavior During Therapy: Flat affect Overall Cognitive Status: History of cognitive impairments - at baseline                                 General Comments: Not following commands at times. Response time delayed        General Comments General comments (skin integrity, edema, etc.): VSS with pt on 4LO2.  Per pt used 3LO2 PTA.    Exercises     Assessment/Plan    PT Assessment Patient needs continued PT services  PT Problem List Decreased activity tolerance;Decreased balance;Decreased mobility;Decreased knowledge of precautions;Decreased knowledge of use of  DME;Decreased safety awareness;Cardiopulmonary status limiting activity       PT Treatment Interventions DME instruction;Gait training;Functional mobility training;Therapeutic activities;Therapeutic exercise;Balance training;Patient/family education    PT Goals (Current goals can be found in the Care Plan section)  Acute Rehab PT Goals Patient Stated Goal: to get better PT Goal Formulation: With patient Time For Goal Achievement: 01/28/22 Potential to Achieve Goals: Good    Frequency Min 3X/week     Co-evaluation               AM-PAC PT "6 Clicks" Mobility  Outcome Measure Help needed turning from your back to your side while in a flat bed without using bedrails?: A Lot Help needed moving from lying on your back to sitting on the side of a flat bed without using bedrails?: Total Help needed moving to and from a bed to a chair (including a wheelchair)?: A Lot Help needed standing up from a chair using your arms (e.g., wheelchair or bedside chair)?: A Lot Help needed to walk in hospital room?: Total Help needed climbing 3-5 steps with a railing? : Total 6 Click Score: 9    End  of Session Equipment Utilized During Treatment: Gait belt;Oxygen Activity Tolerance: Patient limited by fatigue Patient left: with call bell/phone within reach;in bed;with bed alarm set;with nursing/sitter in room Nurse Communication: Mobility status PT Visit Diagnosis: Muscle weakness (generalized) (M62.81)    Time: 5009-3818 PT Time Calculation (min) (ACUTE ONLY): 33 min   Charges:   PT Evaluation $PT Eval Moderate Complexity: 1 Mod PT Treatments $Therapeutic Activity: 8-22 mins        [facility] M,PT Acute Rehab Services [phone]   Alvira Philips 01/14/2022, 2:35 PM

## 2022-01-14 NOTE — Progress Notes (Signed)
[facility] Physician-Brief Progress Note Patient Name: Jeffrey Campbell DOB: Jul 04, 1932 MRN: [id number]   Date of Service  01/14/2022  HPI/Events of Note  K 3.3  Mg 1.6 Dialysis patient  eICU Interventions  30 meq kcl iv and 1 gm mgsulfate     Intervention Category Intermediate Interventions: Electrolyte abnormality - evaluation and management  Mauri Brooklyn, P 01/14/2022, 6:35 AM

## 2022-01-14 NOTE — Progress Notes (Signed)
Subjective:  Had HD yesterday afternoon-  only removed 500 ccs-  was able to be extubated after and has remained on [place]- hemodynamically stable  Objective Vital signs in last 24 hours: Vitals:   01/14/22 0401 01/14/22 0430 01/14/22 0500 01/14/22 0530  BP:  (!) 130/49 (!) 133/53 (!) 130/49  Pulse:  71 78 65  Resp:  (!) 26 (!) 26 (!) 28  Temp:      TempSrc:      SpO2: 94% 99% 100% 99%  Weight:   56.8 kg   Height:       Weight change: 5.2 kg  Intake/Output Summary (Last 24 hours) at 01/14/2022 0645 Last data filed at 01/14/2022 0400 Gross per 24 hour  Intake 666.52 ml  Output 500 ml  Net 166.52 ml    Dialysis Orders:  MWF  - [facility]  4hrs, BFR 400, DFR AF1.5,  EDW 54.5kg, 2K/ 2Ca   Access: AVF  Heparin 3000 unit bolus Calcitriol 0.5 mcg PO qHD Sensipar '120mg'$  qHD     Assessment/Plan: AMS - likely 2/2 bacteremia and PNA. EEG with no seizure activity. Hx dementia.   Acute respiratory failure - intubated. Concern for aspiration on CT. Per CCM.  Able to be extubated 10/18 late Aspiration PNA - noted on CT. Intubated. On rocephin and vanc Bacteremia - BC +streptococcus pneumoniae. On rocephin and vanc  ESRD -  On HD MWF.  Plan for HD next tomorrow via AVF  Hypertension/volume  - initially hypotensive but BP improved with fluids.  3L over dry weight but does not appear overloaded.   minimal to no UF with HD yest.  Getting d5 at 50 per hour for sugar in the 80's-  try to wean off when can  Anemia of CKD - Hgb 9.2.  No recent ESA, will start. No iron in setting of acute infection.  Secondary Hyperparathyroidism -  Ca and phos in goal. No meds now as is NPO-  will resume calcitriol and sensipar but does not appear to need binder at present   Nutrition - NPO CAD, CHB s/p PPM DMT2 COPD [place]-  3 k bath-  also given some supp K this AM    Louis Meckel    Labs: Basic Metabolic Panel: Recent Labs  Lab 01/13/22 0604 01/13/22 1405 01/13/22 1654 01/14/22 0453  NA 138 137   --  138  K 3.4* 3.3*  --  3.3*  CL 97* 97*  --  96*  CO2 24 23  --  26  GLUCOSE 66* 85  --  101*  BUN 46* 51*  --  23  CREATININE 5.59* 6.02*  --  3.31*  CALCIUM 8.9 8.9  --  9.0  PHOS 3.9 4.3 1.7* 3.8   Liver Function Tests: Recent Labs  Lab 12/29/2021 1012 01/13/22 1405  AST 59*  --   ALT 33  --   ALKPHOS 53  --   BILITOT 1.2  --   PROT 5.6*  --   ALBUMIN 2.8* 2.8*   No results for input(s): "LIPASE", "AMYLASE" in the last 168 hours. No results for input(s): "AMMONIA" in the last 168 hours. CBC: Recent Labs  Lab 12/29/2021 1012 01/18/2022 1029 01/05/2022 1146 01/13/22 0604 01/14/22 0453  WBC 7.0  --   --  7.9 7.6  NEUTROABS 5.6  --   --   --   --   HGB 9.6*   < > 9.9* 9.2* 9.0*  HCT 30.5*   < > 29.0* 28.0* 27.9*  MCV 103.7*  --   --  98.9 100.4*  PLT 94*  --   --  75* 82*   < > = values in this interval not displayed.   Cardiac Enzymes: No results for input(s): "CKTOTAL", "CKMB", "CKMBINDEX", "TROPONINI" in the last 168 hours. CBG: Recent Labs  Lab 01/13/22 1112 01/13/22 1509 01/13/22 1911 01/13/22 2326 01/14/22 0324  GLUCAP 77 84 75 81 85    Iron Studies: No results for input(s): "IRON", "TIBC", "TRANSFERRIN", "FERRITIN" in the last 72 hours. Studies/Results: DG Abd Portable 1V  Result Date: 01/13/2022 CLINICAL DATA:  OG tube placement EXAM: PORTABLE ABDOMEN - 1 VIEW COMPARISON:  01/13/2022 FINDINGS: OG tube tip in the mid to distal elongated stomach. No evidence of bowel obstruction. IMPRESSION: OG tube tip in the mid to distal stomach. Electronically Signed   By: Rolm Baptise M.D.   On: 01/13/2022 19:18   DG Abd Portable 1V  Result Date: 01/13/2022 CLINICAL DATA:  Encounter for orogastric tube placement. EXAM: PORTABLE ABDOMEN - 1 VIEW COMPARISON:  Radiograph earlier today. FINDINGS: The enteric tube tip and side port are below the diaphragm in the stomach, tip in the region of the gastric body. Nonobstructive upper abdominal bowel gas pattern. Pacemaker  wires partially included. Bibasilar lung opacities, left-greater-than-right, better assessed on yesterday's CT. IMPRESSION: Enteric tube tip and side-port in the stomach, tip in the region of the gastric body. Electronically Signed   By: Keith Rake M.D.   On: 01/13/2022 18:55   DG Abd Portable 1V  Result Date: 01/13/2022 CLINICAL DATA:  Enteric tube placement in the setting of acute hypoxic respiratory failure requiring intubation and mechanical ventilation EXAM: PORTABLE ABDOMEN - 1 VIEW COMPARISON:  CT abdomen and pelvis dated 12/27/2021 FINDINGS: Enteric tube tip projects over the distal stomach/proximal duodenum Pacemaker wires terminate over the right atrium and right ventricle. Paucity of bowel gas. Bibasilar patchy opacities, better evaluated on prior CT. IMPRESSION: Enteric tube tip projects over the distal stomach/proximal duodenum. Electronically Signed   By: Darrin Nipper M.D.   On: 01/13/2022 16:22   ECHOCARDIOGRAM COMPLETE  Result Date: 01/07/2022    ECHOCARDIOGRAM REPORT   Patient Name:   COLLEN VINCENT Date of Exam: 01/19/2022 Medical Rec #:  [id number]     Height:       69.0 in Accession #:    [id number]    Weight:       123.2 lb Date of Birth:  Jun 23, 1932     BSA:          1.682 m Patient Age:    88 years      BP:           95/65 mmHg Patient Gender: M             HR:           60 bpm. Exam Location:  Inpatient Procedure: 2D Echo, Color Doppler and Cardiac Doppler Indications:    R06.9 DOE  History:        Patient has prior history of Echocardiogram examinations, most                 recent 01/05/2019. CHF, CAD, COPD; Risk Factors:Hypertension,                 Diabetes, Dyslipidemia, Sleep Apnea and Former Smoker.  Sonographer:    Raquel Sarna Senior RDCS Referring Phys: [id number] Omar Person  Sonographer Comments: Scanned supine on artificial respirator. IMPRESSIONS  1. Left ventricular ejection  fraction, by estimation, is 45 to 50%. Left ventricular ejection fraction by 2D MOD biplane is 46.3  %. The left ventricle has mildly decreased function. The left ventricle demonstrates global hypokinesis. There is moderate left ventricular hypertrophy. Left ventricular diastolic parameters are consistent with Grade III diastolic dysfunction (restrictive). Elevated left ventricular end-diastolic pressure.  2. Right ventricular systolic function is moderately reduced. The right ventricular size is mildly enlarged. There is moderately elevated pulmonary artery systolic pressure. The estimated right ventricular systolic pressure is 42.7 mmHg.  3. Left atrial size was severely dilated.  4. Right atrial size was severely dilated.  5. The mitral valve is abnormal. Mild mitral valve regurgitation.  6. The tricuspid valve is abnormal. Tricuspid valve regurgitation is mild to moderate.  7. The aortic valve is tricuspid. Aortic valve regurgitation is trivial. Aortic valve sclerosis is present, with no evidence of aortic valve stenosis.  8. Mildly dilated pulmonary artery.  9. The inferior vena cava is dilated in size with <50% respiratory variability, suggesting right atrial pressure of 15 mmHg. Comparison(s): Changes from prior study are noted. 01/05/2019: LVEF 55-60%. FINDINGS  Left Ventricle: Left ventricular ejection fraction, by estimation, is 45 to 50%. Left ventricular ejection fraction by 2D MOD biplane is 46.3 %. The left ventricle has mildly decreased function. The left ventricle demonstrates global hypokinesis. The left ventricular internal cavity size was normal in size. There is moderate left ventricular hypertrophy. Abnormal (paradoxical) septal motion, consistent with left bundle branch block. Left ventricular diastolic parameters are consistent with Grade III diastolic dysfunction (restrictive). Elevated left ventricular end-diastolic pressure. Right Ventricle: The right ventricular size is mildly enlarged. No increase in right ventricular wall thickness. Right ventricular systolic function is moderately  reduced. There is moderately elevated pulmonary artery systolic pressure. The tricuspid regurgitant velocity is 2.78 m/s, and with an assumed right atrial pressure of 15 mmHg, the estimated right ventricular systolic pressure is 06.2 mmHg. Left Atrium: Left atrial size was severely dilated. Right Atrium: Right atrial size was severely dilated. Pericardium: There is no evidence of pericardial effusion. Mitral Valve: The mitral valve is abnormal. There is mild thickening of the anterior and posterior mitral valve leaflet(s). Mild mitral valve regurgitation. Tricuspid Valve: The tricuspid valve is abnormal. Tricuspid valve regurgitation is mild to moderate. Aortic Valve: The aortic valve is tricuspid. Aortic valve regurgitation is trivial. Aortic valve sclerosis is present, with no evidence of aortic valve stenosis. Pulmonic Valve: The pulmonic valve was grossly normal. Pulmonic valve regurgitation is trivial. Aorta: The aortic root and ascending aorta are structurally normal, with no evidence of dilitation. Pulmonary Artery: The pulmonary artery is mildly dilated. Venous: The inferior vena cava is dilated in size with less than 50% respiratory variability, suggesting right atrial pressure of 15 mmHg. IAS/Shunts: No atrial level shunt detected by color flow Doppler. Additional Comments: There is pleural effusion in the left lateral region.  LEFT VENTRICLE PLAX 2D                        Biplane EF (MOD) LVIDd:         3.50 cm         LV Biplane EF:   Left LVIDs:         2.60 cm                          ventricular LV PW:         1.60 cm  ejection LV IVS:        1.30 cm                          fraction by LVOT diam:     2.00 cm                          2D MOD LV SV:         62                               biplane is LV SV Index:   37                               46.3 %. LVOT Area:     3.14 cm                                Diastology                                LV e' medial:    6.96 cm/s LV  Volumes (MOD)               LV E/e' medial:  14.2 LV vol d, MOD    110.0 ml      LV e' lateral:   9.14 cm/s A2C:                           LV E/e' lateral: 10.8 LV vol d, MOD    125.0 ml A4C: LV vol s, MOD    56.6 ml A2C: LV vol s, MOD    73.5 ml A4C: LV SV MOD A2C:   53.4 ml LV SV MOD A4C:   125.0 ml LV SV MOD BP:    53.4 ml RIGHT VENTRICLE RV S prime:     6.96 cm/s TAPSE (M-mode): 1.1 cm LEFT ATRIUM              Index        RIGHT ATRIUM           Index LA diam:        3.90 cm  2.32 cm/m   RA Area:     31.00 cm LA Vol (A2C):   137.0 ml 81.46 ml/m  RA Volume:   115.00 ml 68.38 ml/m LA Vol (A4C):   82.1 ml  48.82 ml/m LA Biplane Vol: 111.0 ml 66.00 ml/m  AORTIC VALVE LVOT Vmax:   110.00 cm/s LVOT Vmean:  77.900 cm/s LVOT VTI:    0.196 m  AORTA Ao Root diam: 3.30 cm Ao Asc diam:  2.90 cm MITRAL VALVE               TRICUSPID VALVE MV Area (PHT): 3.37 cm    TR Peak grad:   30.9 mmHg MV Decel Time: 225 msec    TR Vmax:        278.00 cm/s MV E velocity: 98.50 cm/s MV A velocity: 43.90 cm/s  SHUNTS MV E/A ratio:  2.24        Systemic VTI:  0.20 m  Systemic Diam: 2.00 cm Lyman Bishop MD Electronically signed by Lyman Bishop MD Signature Date/Time: 01/20/2022/6:17:08 PM    Final    EEG adult  Result Date: 01/11/2022 Lora Havens, MD     01/13/2022 10:31 AM Patient Name: ZEV BLUE MRN: [id number] Epilepsy Attending: Lora Havens Referring Physician/Provider: Kipp Brood, MD Date: 12/31/2021 Duration: 22.24 mins Patient history: 88 year old male with altered mental status.  EEG to evaluate for seizure. Level of alertness: Awake AEDs during EEG study: None Technical aspects: This EEG study was done with scalp electrodes positioned according to the 10-20 International system of electrode placement. Electrical activity was reviewed with band pass filter of 1-'70Hz'$ , sensitivity of 7 uV/mm, display speed of 16m/sec with a '60Hz'$  notched filter applied as appropriate. EEG data  were recorded continuously and digitally stored.  Video monitoring was available and reviewed as appropriate. Description: No clear posterior dominant rhythm was seen. EEG showed continuous generalized predominantly 5 to 6 Hz theta slowing admixed with intermittent generalized 2-'3Hz'$  delta slowing.  Hyperventilation and photic stimulation were not performed.   ABNORMALITY - Continuous slow, generalized IMPRESSION: This study is suggestive of moderate diffuse encephalopathy, nonspecific etiology. No seizures or epileptiform discharges were seen throughout the recording. PLora Havens  CT CHEST ABDOMEN PELVIS W CONTRAST  Result Date: 01/08/2022 CLINICAL DATA:  Sepsis.  Found down. EXAM: CT CHEST, ABDOMEN, AND PELVIS WITH CONTRAST TECHNIQUE: Multidetector CT imaging of the chest, abdomen and pelvis was performed following the standard protocol during bolus administration of intravenous contrast. RADIATION DOSE REDUCTION: This exam was performed according to the departmental dose-optimization program which includes automated exposure control, adjustment of the mA and/or kV according to patient size and/or use of iterative reconstruction technique. CONTRAST:  729mOMNIPAQUE IOHEXOL 350 MG/ML SOLN COMPARISON:  Chest x-ray, same date. FINDINGS: CT CHEST FINDINGS Cardiovascular: The heart is mildly enlarged. No pericardial effusion. The pacer wires are in good position without complicating features. Advanced aortic and coronary artery calcifications. 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 Recommend non urgent thoracic surgery consultation. No aortic dissection. Stable three-vessel coronary artery calcifications. Mediastinum/Nodes: Scattered mediastinal hilar lymph nodes likely reactive given lung findings. There is an NG tube coursing down the esophagus and into the stomach. The endotracheal tube is in good position at the mid tracheal level.  Lungs/Pleura: Extensive bilateral pulmonary infiltrates with dense left lower lobe airspace consolidation. Findings highly suspicious for aspiration. Underlying emphysematous changes and pulmonary scarring. Small bilateral pleural effusions. Musculoskeletal: No significant bony findings. The sternum, ribs and thoracic vertebral bodies are intact. CT ABDOMEN PELVIS FINDINGS Hepatobiliary: No hepatic lesions or intrahepatic biliary dilatation. Small blush of contrast enhancement in the left hepatic lobe not present on delayed images. This is likely a small vascular shunt. Gallbladder is grossly normal. It is surrounded by fluid. No common bile duct dilatation. Pancreas: No mass, inflammation or ductal dilatation. Spleen: Normal size.  No focal lesions. Adrenals/Urinary Tract: The adrenal glands and kidneys are unremarkable. Moderate renal cortical thinning on the right side but no worrisome renal lesions or hydronephrosis. The bladder contains a Foley catheter. Stomach/Bowel: The stomach, duodenum, small bowel and colon are grossly normal. No obvious acute inflammatory process, mass lesions or obstructive findings. Vascular/Lymphatic: Advanced atherosclerotic calcifications involving the aorta and branch vessels but no aneurysm or dissection. The major venous structures are patent. No mesenteric or retroperitoneal mass or adenopathy. Reproductive: The prostate gland  and seminal vesicles are unremarkable. Other: Small amount of free abdominal and free pelvic fluid along with diffuse body wall edema suggesting anasarca. Right inguinal hernia contains small bowel loops. I do not see any findings for incarceration or obstruction. Musculoskeletal: No significant bony findings. No spine or pelvic fractures are identified. The pubic symphysis and SI joints are intact. IMPRESSION: 1. Extensive bilateral pulmonary infiltrates with dense left lower lobe airspace consolidation. Findings highly suspicious for aspiration  pneumonia. 2. Small bilateral pleural effusions. 3. No acute abdominal/pelvic findings. 4. Small amount of free abdominal and free pelvic fluid along with diffuse body wall edema suggesting anasarca. 5. Right inguinal hernia contains small bowel loops. I do not see any findings for incarceration or obstruction. 6. Advanced atherosclerotic calcifications involving the thoracic and abdominal aorta and branch vessels including the coronary arteries. 7. 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 Recommend non urgent thoracic surgery consultation. Electronically Signed   By: Marijo Sanes M.D.   On: 01/02/2022 13:07   CT Head Wo Contrast  Result Date: 01/23/2022 CLINICAL DATA:  Delirium.  Found down this morning. EXAM: CT HEAD WITHOUT CONTRAST TECHNIQUE: Contiguous axial images were obtained from the base of the skull through the vertex without intravenous contrast. RADIATION DOSE REDUCTION: This exam was performed according to the departmental dose-optimization program which includes automated exposure control, adjustment of the mA and/or kV according to patient size and/or use of iterative reconstruction technique. COMPARISON:  Head CT 11/16/2021 FINDINGS: Brain: Stable age related advanced cerebral atrophy, ventriculomegaly and periventricular white matter disease. No extra-axial fluid collections are identified. No CT findings for acute hemispheric infarction or intracranial hemorrhage. No mass lesions. The brainstem and cerebellum are normal. Vascular: Stable vascular calcifications.  No hyperdense vessels. Skull: No skull fracture or bone lesions. Sinuses/Orbits: The paranasal sinuses and mastoid air cells are clear. The globes are intact. Other: No scalp lesions or scalp hematoma. IMPRESSION: 1. Stable age related advanced cerebral atrophy, ventriculomegaly and periventricular white matter disease. 2. No acute intracranial findings or skull  fracture. Electronically Signed   By: Marijo Sanes M.D.   On: 12/28/2021 12:30   DG Chest Portable 1 View  Result Date: 01/06/2022 CLINICAL DATA:  88 year old male intubated. EXAM: PORTABLE CHEST 1 VIEW COMPARISON:  Portable chest 1021 hours today and earlier. FINDINGS: Portable AP semi upright view at 1138 hours. Endotracheal tube tip in good position between the level the clavicles and carina. Enteric tube courses to the abdomen, side hole is near the expected level of the GEJ at or just below the diaphragm. Stable lung volumes. Stable cardiomegaly and mediastinal contours. Right chest dual lead cardiac pacemaker. Left greater than right dense peribronchial and lower lobe opacity, no definite air bronchograms. Left pleural effusion is not excluded. Pulmonary vascularity does not appear significantly changed since August. No pneumothorax. Calcified aortic atherosclerosis. No acute osseous abnormality identified. IMPRESSION: 1. Endotracheal tube tip in good position. Enteric tube side hole near the GEJ, advanced 5 cm to ensure side hole placement inside the stomach. 2. Stable ventilation with confluent lower lung left > right opacity. Left pleural effusion is possible. No overt edema. Electronically Signed   By: Genevie Ann M.D.   On: 01/10/2022 11:49   DG Chest Port 1 View  Result Date: 01/08/2022 CLINICAL DATA:  Questionable sepsis EXAM: PORTABLE CHEST 1 VIEW COMPARISON:  11/16/2021 FINDINGS: Indistinct airspace disease on the left more than right.  Cardiomegaly with dual-chamber pacer leads from the right. No definite effusion, although possible on the left. No pneumothorax. IMPRESSION: Bilateral airspace disease which could be edema or pneumonia. A similar appearance was seen 11/16/2021 Electronically Signed   By: Jorje Guild M.D.   On: 01/19/2022 10:30   Medications: Infusions:  cefTRIAXone (ROCEPHIN)  IV Stopped (01/13/22 1118)   feeding supplement (PEPTAMEN 1.5 CAL) Stopped (01/13/22 2050)    heparin 850 Units/hr (01/14/22 0400)   magnesium sulfate bolus IVPB     potassium chloride     propofol (DIPRIVAN) infusion Stopped (01/13/22 0730)   vancomycin Stopped (01/13/22 1802)    Scheduled Medications:  atorvastatin  20 mg Per Tube Daily   Chlorhexidine Gluconate Cloth  6 each Topical Daily   Chlorhexidine Gluconate Cloth  6 each Topical Q0600   docusate  100 mg Per Tube BID   donepezil  10 mg Per Tube QHS   feeding supplement (PROSource TF20)  60 mL Per Tube Daily   fiber supplement (BANATROL TF)  60 mL Per Tube BID   levothyroxine  150 mcg Per Tube Q0600   multivitamin  1 tablet Per Tube QHS   pantoprazole  40 mg Per Tube Daily   polyethylene glycol  17 g Per Tube Daily    have reviewed scheduled and prn medications.  Physical Exam: General:somnolent but arousable-  weak cough Heart: RRR Lungs: CBS bilat Abdomen: soft, non tender Extremities: no peripheral edema  Dialysis Access: left AVF-  patent     01/14/2022,6:45 AM  LOS: 2 days

## 2022-01-14 NOTE — IPAL (Signed)
Interdisciplinary Goals of Care Family Meeting   Date carried out:: 01/14/2022  Location of the meeting: Phone conference  Member's involved: Physician, Bedside Registered Nurse, and Family Member or next of kin  Durable Power of Attorney or acting medical decision maker: Genevieve Norlander    Discussion: We discussed goals of care for [name] .    The Clinical status was relayed to daughter over the phone in detail.   Updated and notified of patients medical condition.   Patient is having a weak cough and struggling to remove secretions.  He came off of the ventilator this morning, with advanced dementia and decreased ability to clear secretions he remains at high risk of developing recurrent pneumonia and possibly requiring mechanical ventilation again.  I explained that if he requires mechanical ventilation frequently he may not come off of ventilator considering he is generalized weak also we discussed CODE STATUS.  Patient daughter stated that his father had told her previously that he would want everything to be done medically possible to keep him alive.  So she would honor his wishes and would like him to keep full code and try everything including going back on mechanical ventilator if he needs to.   Code status: Full Code  Disposition: Continue current acute care    Family are satisfied with Plan of action and management. All questions answered   [name] MD [facility] See Amion for pager If no response to pager, please call [phone] until 7pm After 7pm, Please call E-link [phone]

## 2022-01-14 NOTE — Progress Notes (Signed)
SLP Cancellation Note  Patient Details Name: Jeffrey Campbell MRN: [id number] DOB: Feb 28, 1933   Cancelled treatment:       Reason Eval/Treat Not Completed: Patient not medically ready  Pt extubated overnight (intubated 2 days). Per consult with RN, pt is drowsy, exhibits a weak cough. Voice quality is low in intensity and wet/gurgly. Pt follows commands intermittently. He is not supported nutritionally at this time. Will continue efforts.   Jeffrey Campbell B. Quentin Ore, [facility], [place] Speech Language Pathologist Office: [phone]  Shonna Chock 01/14/2022, 9:17 AM

## 2022-01-14 NOTE — Progress Notes (Signed)
Nutrition Follow-up  DOCUMENTATION CODES:   Severe malnutrition in context of chronic illness  INTERVENTION:   Recommend Cortrak placement on 01/15/22 and initiation of enteral nutrition: - Start Peptamen 1.5 at 20 ml/hr and advance by 10 ml q 6 hours to goal rate of 50 ml/hr (1200 ml/day) - PROSource TF20 60 ml once daily  Recommended tube feeding regimen at goal rate provides 1880 kcal, 102 grams of protein, and 923 ml of H2O.   - Continue rena-vit daily  NUTRITION DIAGNOSIS:   Severe Malnutrition related to chronic illness (ESRD on HD, COPD) as evidenced by severe fat depletion, moderate muscle depletion, severe muscle depletion.  Ongoing, being addressed via Cortrak placement and initiation of enteral nutrition on 10/20  GOAL:   Patient will meet greater than or equal to 90% of their needs  Progressing with initiation of enteral nutrition on 10/20  MONITOR:   Labs, Weight trends, TF tolerance, Skin, I & O's  REASON FOR ASSESSMENT:   Ventilator, Consult Enteral/tube feeding initiation and management  ASSESSMENT:   88 year old male with PMHx of peptic ulcer disease, HTN, HLD, CAD, ESRD on HD MWF, COPD, dementia, hypothyroidism, DM who presented after being found unresponsive at home with septic shock and acute hypoxic respiratory failure secondary to aspiration PNA, acute metabolic encephalopathy. Intubated 01/26/2022.  10/18 - extubated  Discussed pt with RN and during ICU rounds. SLP recommending pt remain NPO at this time. Pt sleeping soundly at time of RD visit and did not awaken to RD voice or touch.  Last iHD was 10/18 with 500 ml net UF. Post-HD weight was 61.1 kg.  EDW: 54.5 kg Admit weight: 55.9 kg Current weight: 56.8 kg  Medications reviewed and include: calcitriol, sensipar, aranesp, rena-vit, IV abx, heparin IVF: D5 in LR @ 50 ml/hr  Labs reviewed: potassium 3.3 (pt received IV KCl 10 mEq x 3), ionized calcium 1.13, magnesium 1.6 (pt received IV  magnesium sulfate 1 gram x 2), hemoglobin 9.0, platelets 82 CBG's: 75-114 x 24 hours  I/O's: +2.4 L since admit  Diet Order:   Diet Order     None       EDUCATION NEEDS:   Not appropriate for education at this time  Skin:  Skin Assessment: Skin Integrity Issues: Stage I: sacrum  Last BM:  01/14/22 rectal tube  Height:   Ht Readings from Last 1 Encounters:  01/22/2022 '5\' 9"'$  (1.753 m)    Weight:   Wt Readings from Last 1 Encounters:  01/14/22 56.8 kg    Ideal Body Weight:  72.7 kg  BMI:  Body mass index is 18.49 kg/m.  Estimated Nutritional Needs:   Kcal:  0539-7673  Protein:  90-100 grams  Fluid:  UOP + 1 L    Gustavus Bryant, MS, RD, LDN Inpatient Clinical Dietitian Please see AMiON for contact information.

## 2022-01-14 NOTE — Progress Notes (Signed)
Pt remained on 4LNC through the night. He has a weak, non-productive cough.

## 2022-01-15 ENCOUNTER — Inpatient Hospital Stay (HOSPITAL_COMMUNITY): Payer: Medicare Other

## 2022-01-15 DIAGNOSIS — A409 Streptococcal sepsis, unspecified: Secondary | ICD-10-CM | POA: Diagnosis not present

## 2022-01-15 DIAGNOSIS — I5021 Acute systolic (congestive) heart failure: Secondary | ICD-10-CM | POA: Diagnosis not present

## 2022-01-15 DIAGNOSIS — G9340 Encephalopathy, unspecified: Secondary | ICD-10-CM | POA: Diagnosis not present

## 2022-01-15 DIAGNOSIS — I4819 Other persistent atrial fibrillation: Secondary | ICD-10-CM | POA: Diagnosis not present

## 2022-01-15 DIAGNOSIS — A419 Sepsis, unspecified organism: Secondary | ICD-10-CM | POA: Diagnosis not present

## 2022-01-15 DIAGNOSIS — R6521 Severe sepsis with septic shock: Secondary | ICD-10-CM

## 2022-01-15 DIAGNOSIS — J9601 Acute respiratory failure with hypoxia: Secondary | ICD-10-CM | POA: Diagnosis not present

## 2022-01-15 DIAGNOSIS — I214 Non-ST elevation (NSTEMI) myocardial infarction: Secondary | ICD-10-CM | POA: Diagnosis not present

## 2022-01-15 LAB — RENAL FUNCTION PANEL
Albumin: 2.5 g/dL — ABNORMAL LOW (ref 3.5–5.0)
Anion gap: 18 — ABNORMAL HIGH (ref 5–15)
BUN: 35 mg/dL — ABNORMAL HIGH (ref 8–23)
CO2: 24 mmol/L (ref 22–32)
Calcium: 9.3 mg/dL (ref 8.9–10.3)
Chloride: 96 mmol/L — ABNORMAL LOW (ref 98–111)
Creatinine, Ser: 4.55 mg/dL — ABNORMAL HIGH (ref 0.61–1.24)
GFR, Estimated: 12 mL/min — ABNORMAL LOW (ref >60)
Glucose, Bld: 111 mg/dL — ABNORMAL HIGH (ref 70–99)
Phosphorus: 3.5 mg/dL (ref 2.5–4.6)
Potassium: 3.2 mmol/L — ABNORMAL LOW (ref 3.5–5.1)
Sodium: 138 mmol/L (ref 135–145)

## 2022-01-15 LAB — GLUCOSE, CAPILLARY
Glucose-Capillary: 102 mg/dL — ABNORMAL HIGH (ref 70–99)
Glucose-Capillary: 125 mg/dL — ABNORMAL HIGH (ref 70–99)
Glucose-Capillary: 98 mg/dL (ref 70–99)
Glucose-Capillary: 113 mg/dL — ABNORMAL HIGH (ref 70–99)
Glucose-Capillary: 130 mg/dL — ABNORMAL HIGH (ref 70–99)
Glucose-Capillary: 151 mg/dL — ABNORMAL HIGH (ref 70–99)

## 2022-01-15 LAB — CULTURE, BLOOD (ROUTINE X 2): Special Requests: ADEQUATE

## 2022-01-15 LAB — CBC
HCT: 27.4 % — ABNORMAL LOW (ref 39.0–52.0)
Hemoglobin: 9.1 g/dL — ABNORMAL LOW (ref 13.0–17.0)
MCH: 33.2 pg (ref 26.0–34.0)
MCHC: 33.2 g/dL (ref 30.0–36.0)
MCV: 100 fL (ref 80.0–100.0)
Platelets: 78 10*3/uL — ABNORMAL LOW (ref 150–400)
RBC: 2.74 MIL/uL — ABNORMAL LOW (ref 4.22–5.81)
RDW: 14.9 % (ref 11.5–15.5)
WBC: 6.2 10*3/uL (ref 4.0–10.5)
nRBC: 0 % (ref 0.0–0.2)

## 2022-01-15 LAB — HEPARIN LEVEL (UNFRACTIONATED): Heparin Unfractionated: <0.1 IU/mL — ABNORMAL LOW (ref 0.30–0.70)

## 2022-01-15 LAB — PHOSPHORUS: Phosphorus: 3.5 mg/dL (ref 2.5–4.6)

## 2022-01-15 LAB — MAGNESIUM: Magnesium: 2.2 mg/dL (ref 1.7–2.4)

## 2022-01-15 MED ORDER — HEPARIN SODIUM (PORCINE) 5000 UNIT/ML IJ SOLN
5000.0000 [IU] | Freq: Three times a day (TID) | INTRAMUSCULAR | Status: DC
  Administered 2022-01-15 – 2022-01-19 (×10): 5000 [IU] via SUBCUTANEOUS
  Filled 2022-01-15 (×10): qty 1

## 2022-01-15 MED ORDER — PEPTAMEN 1.5 CAL PO LIQD
1000.0000 mL | ORAL | Status: DC
  Administered 2022-01-15 – 2022-01-24 (×10): 1000 mL
  Filled 2022-01-15 (×15): qty 1000

## 2022-01-15 MED ORDER — ALBUTEROL SULFATE (2.5 MG/3ML) 0.083% IN NEBU
2.5000 mg | INHALATION_SOLUTION | RESPIRATORY_TRACT | Status: DC | PRN
  Administered 2022-01-15 – 2022-01-23 (×20): 2.5 mg via RESPIRATORY_TRACT
  Filled 2022-01-15 (×22): qty 3

## 2022-01-15 MED ORDER — ACETYLCYSTEINE 20 % IN SOLN
3.0000 mL | Freq: Once | RESPIRATORY_TRACT | Status: AC
  Administered 2022-01-15: 3 mL via RESPIRATORY_TRACT
  Filled 2022-01-15: qty 4

## 2022-01-15 MED ORDER — ASPIRIN 81 MG PO CHEW
81.0000 mg | CHEWABLE_TABLET | Freq: Every day | ORAL | Status: DC
  Administered 2022-01-15 – 2022-01-30 (×16): 81 mg
  Filled 2022-01-15 (×16): qty 1

## 2022-01-15 MED ORDER — PROSOURCE TF20 ENFIT COMPATIBL EN LIQD
60.0000 mL | Freq: Every day | ENTERAL | Status: DC
  Administered 2022-01-15 – 2022-01-24 (×10): 60 mL
  Filled 2022-01-15 (×10): qty 60

## 2022-01-15 NOTE — Progress Notes (Signed)
Subjective:  failed speech eval- still extubated but on high flow O2-  a little more alert this AM.  Is about 1.5 liters positive overnight -  mostly due to d5 at 50 per hour-  sugar was better this AM  Objective Vital signs in last 24 hours: Vitals:   01/15/22 0500 01/15/22 0600 01/15/22 0700 01/15/22 0717  BP: 129/70 134/70    Pulse:  (!) 114 72   Resp: (!) 21 (!) 23 (!) 22   Temp:    98.5 F (36.9 C)  TempSrc:    Axillary  SpO2:  100% 100%   Weight: 59.7 kg     Height:       Weight change: -1.4 kg  Intake/Output Summary (Last 24 hours) at 01/15/2022 0738 Last data filed at 01/15/2022 0700 Gross per 24 hour  Intake 1765.63 ml  Output 380 ml  Net 1385.63 ml    Dialysis Orders:  MWF  - [facility]  4hrs, BFR 400, DFR AF1.5,  EDW 54.5kg, 2K/ 2Ca   Access: AVF  Heparin 3000 unit bolus Calcitriol 0.5 mcg PO qHD Sensipar '120mg'$  qHD     Assessment/Plan: AMS - likely 2/2 bacteremia and PNA. EEG with no seizure activity. Hx dementia.   Acute respiratory failure - intubated initially. Concern for aspiration on CT. Per CCM.  Able to be extubated 10/18 late Aspiration PNA - noted on CT. Intubated. On rocephin and vanc Bacteremia - BC +streptococcus pneumoniae. On rocephin and vanc  ESRD -  On HD MWF.  Plan for HD next today via AVF  Hypertension/volume  - initially hypotensive but BP improved with fluids.  5 kg over dry weight but does not appear overloaded.   minimal to no UF with HD 10/18.  Getting d5 at 50 per hour for sugar in the 80's-  now sugar above 100- will dec ivf to 25 per hour and try to wean off when can  Anemia of CKD - Hgb 9.2.  No recent ESA, will start-  100 weekly. No iron in setting of acute infection.  Secondary Hyperparathyroidism -  Ca and phos in goal. No meds now as is NPO-  to get coretrack will resume calcitriol and sensipar but does not appear to need binder at present   Nutrition - NPO CAD, CHB s/p PPM DMT2 COPD Elytes-  3 k bath-  also given some supp K  10/19  AM    Louis Meckel    Labs: Basic Metabolic Panel: Recent Labs  Lab 01/13/22 0604 01/13/22 1405 01/13/22 1654 01/14/22 0453 01/15/22 0445  NA 138 137  --  138  --   K 3.4* 3.3*  --  3.3*  --   CL 97* 97*  --  96*  --   CO2 24 23  --  26  --   GLUCOSE 66* 85  --  101*  --   BUN 46* 51*  --  23  --   CREATININE 5.59* 6.02*  --  3.31*  --   CALCIUM 8.9 8.9  --  9.0  --   PHOS 3.9 4.3 1.7* 3.8 3.5   Liver Function Tests: Recent Labs  Lab 01/05/2022 1012 01/13/22 1405  AST 59*  --   ALT 33  --   ALKPHOS 53  --   BILITOT 1.2  --   PROT 5.6*  --   ALBUMIN 2.8* 2.8*   No results for input(s): "LIPASE", "AMYLASE" in the last 168 hours. No results for input(s): "AMMONIA"  in the last 168 hours. CBC: Recent Labs  Lab 01/26/2022 1012 12/28/2021 1029 01/14/2022 1146 01/13/22 0604 01/14/22 0453  WBC 7.0  --   --  7.9 7.6  NEUTROABS 5.6  --   --   --   --   HGB 9.6*   < > 9.9* 9.2* 9.0*  HCT 30.5*   < > 29.0* 28.0* 27.9*  MCV 103.7*  --   --  98.9 100.4*  PLT 94*  --   --  75* 82*   < > = values in this interval not displayed.   Cardiac Enzymes: No results for input(s): "CKTOTAL", "CKMB", "CKMBINDEX", "TROPONINI" in the last 168 hours. CBG: Recent Labs  Lab 01/14/22 1115 01/14/22 1524 01/14/22 1947 01/14/22 2308 01/15/22 0336  GLUCAP 104* 117* 114* 96 102*    Iron Studies: No results for input(s): "IRON", "TIBC", "TRANSFERRIN", "FERRITIN" in the last 72 hours. Studies/Results: DG Abd Portable 1V  Result Date: 01/13/2022 CLINICAL DATA:  OG tube placement EXAM: PORTABLE ABDOMEN - 1 VIEW COMPARISON:  01/13/2022 FINDINGS: OG tube tip in the mid to distal elongated stomach. No evidence of bowel obstruction. IMPRESSION: OG tube tip in the mid to distal stomach. Electronically Signed   By: Rolm Baptise M.D.   On: 01/13/2022 19:18   DG Abd Portable 1V  Result Date: 01/13/2022 CLINICAL DATA:  Encounter for orogastric tube placement. EXAM: PORTABLE ABDOMEN  - 1 VIEW COMPARISON:  Radiograph earlier today. FINDINGS: The enteric tube tip and side port are below the diaphragm in the stomach, tip in the region of the gastric body. Nonobstructive upper abdominal bowel gas pattern. Pacemaker wires partially included. Bibasilar lung opacities, left-greater-than-right, better assessed on yesterday's CT. IMPRESSION: Enteric tube tip and side-port in the stomach, tip in the region of the gastric body. Electronically Signed   By: Keith Rake M.D.   On: 01/13/2022 18:55   DG Abd Portable 1V  Result Date: 01/13/2022 CLINICAL DATA:  Enteric tube placement in the setting of acute hypoxic respiratory failure requiring intubation and mechanical ventilation EXAM: PORTABLE ABDOMEN - 1 VIEW COMPARISON:  CT abdomen and pelvis dated 01/10/2022 FINDINGS: Enteric tube tip projects over the distal stomach/proximal duodenum Pacemaker wires terminate over the right atrium and right ventricle. Paucity of bowel gas. Bibasilar patchy opacities, better evaluated on prior CT. IMPRESSION: Enteric tube tip projects over the distal stomach/proximal duodenum. Electronically Signed   By: Darrin Nipper M.D.   On: 01/13/2022 16:22   Medications: Infusions:  cefTRIAXone (ROCEPHIN)  IV Stopped (01/14/22 1024)   dextrose 5% lactated ringers 50 mL/hr at 01/15/22 0700   heparin 1,350 Units/hr (01/15/22 0700)   vancomycin Stopped (01/13/22 1802)    Scheduled Medications:  aspirin EC  81 mg Oral Daily   atorvastatin  20 mg Per Tube Daily   calcitRIOL  0.5 mcg Oral Q M,W,F-HD   Chlorhexidine Gluconate Cloth  6 each Topical Daily   Chlorhexidine Gluconate Cloth  6 each Topical Q0600   Chlorhexidine Gluconate Cloth  6 each Topical Q0600   cinacalcet  90 mg Oral Q supper   darbepoetin (ARANESP) injection - DIALYSIS  100 mcg Intravenous Q Fri-HD   donepezil  10 mg Per Tube QHS   levothyroxine  150 mcg Per Tube Q0600   multivitamin  1 tablet Per Tube QHS    have reviewed scheduled and prn  medications.  Physical Exam: General:somnolent but arousable-  weak cough Heart: RRR Lungs: CBS bilat Abdomen: soft, non tender Extremities: min  peripheral  edema  Dialysis Access: left AVF-  patent     01/15/2022,7:38 AM  LOS: 3 days

## 2022-01-15 NOTE — Progress Notes (Signed)
Patient transferred to 9S88 without complications. Patient's family notified.

## 2022-01-15 NOTE — Progress Notes (Signed)
ANTICOAGULATION CONSULT NOTE  Pharmacy Consult for Heparin Indication: chest pain/ACS Brief A/P: Heparin level subtherapeutic Increase Heparin rate   Allergies  Allergen Reactions   Cardura [Doxazosin] Shortness Of Breath and Other (See Comments)    Dizziness    Hydrochlorothiazide Shortness Of Breath and Other (See Comments)    Dizziness    Iodinated Contrast Media Other (See Comments)    "shut down his kidneys" Patient has stage III chronic kidney disease   Zocor [Simvastatin] Other (See Comments)    Kidney problems    Protonix [Pantoprazole] Other (See Comments)    Worsening kidney problems   Rocaltrol [Calcitriol] Other (See Comments)    Unknown reaction   Budesonide-Formoterol Fumarate Other (See Comments)    Dry mouth   Minodyl [Minoxidil] Other (See Comments)    Unknown raction   Tiotropium Bromide Monohydrate Other (See Comments)    Dry mouth   Tricor [Fenofibrate]     Unknown reaction    Patient Measurements: Height: '5\' 9"'$  (175.3 cm) Weight: 56.8 kg (125 lb 3.5 oz) IBW/kg (Calculated) : 70.7  Vital Signs: Temp: 98.3 F (36.8 C) (10/20 0343) Temp Source: Oral (10/20 0343) BP: 124/72 (10/20 0100) Pulse Rate: 96 (10/20 0100)  Labs: Recent Labs    01/18/2022 1012 01/11/2022 1029 12/30/2021 1146 01/21/2022 1317 01/05/2022 1453 01/13/22 0604 01/13/22 1405 01/14/22 0011 01/14/22 0453 01/14/22 1010 01/14/22 2052 01/15/22 0445  HGB 9.6*   < > 9.9*  --   --  9.2*  --   --  9.0*  --   --   --   HCT 30.5*   < > 29.0*  --   --  28.0*  --   --  27.9*  --   --   --   PLT 94*  --   --   --   --  75*  --   --  82*  --   --   --   APTT 42*  --   --   --   --   --   --   --   --   --   --   --   LABPROT 17.7*  --   --   --   --   --   --   --   --   --   --   --   INR 1.5*  --   --   --   --   --   --   --   --   --   --   --   HEPARINUNFRC  --   --   --   --   --   --   --    < >  --  <0.10* <0.10* <0.10*  CREATININE 4.66*   < >  --   --   --  5.59* 6.02*  --  3.31*  --    --   --   TROPONINIHS  --   --   --  2,957* 1,610*  --   --   --   --   --   --   --    < > = values in this interval not displayed.     Estimated Creatinine Clearance: 12.9 mL/min (A) (by C-G formula based on SCr of 3.31 mg/dL (H)).  Assessment: 88 y.o. male with elevated troponin and possible ACS for heparin  Goal of Therapy:  Heparin level 0.3-0.7 units/ml Monitor platelets by anticoagulation protocol: Yes  Plan:  Increase Heparin 1350 units/hr Check heparin level in 8 hours.   Phillis Knack, PharmD, BCPS  01/15/2022 6:01 AM

## 2022-01-15 NOTE — Procedures (Signed)
HD Note:  Some information was entered later than the data was gathered due to patient care needs. The stated time with the data is accurate.   Patient was treated at bedside  Alert when spoken to, otherwise resting with eyes closed.  Oriented to self Informed consent signed and in chart.   Patient tolerated well. He rested with eyes closed throughout the treatment. IV antibiotic given, see MAR.  Vancomycin discontinued as soon as it was hung.  Medication stopped immediately  Patient was more alert at the end of dialysis, without acute distress.  He was moving and showing awareness of the medical equipment that he is attached to. Hand-off given to patient's nurse.   Access used: Left upper arm fistula Access issues: The venous pressure was climbing in the last hour of treatment, the BR was reduced to 350 to accomdate.  Total UF removed: [facility]

## 2022-01-15 NOTE — Progress Notes (Addendum)
Rounding Note    Patient Name: VI WHITESEL Date of Encounter: 01/15/2022  [place] Cardiologist: Fransico Him, MD   Subjective   BP 128/72.  SpO2 100% on 4L.  Oriented x1.  Somnolent but arousable.  Inpatient Medications    Scheduled Meds:  aspirin EC  81 mg Oral Daily   atorvastatin  20 mg Per Tube Daily   calcitRIOL  0.5 mcg Oral Q M,W,F-HD   Chlorhexidine Gluconate Cloth  6 each Topical Daily   Chlorhexidine Gluconate Cloth  6 each Topical Q0600   Chlorhexidine Gluconate Cloth  6 each Topical Q0600   cinacalcet  90 mg Oral Q supper   darbepoetin (ARANESP) injection - DIALYSIS  100 mcg Intravenous Q Fri-HD   donepezil  10 mg Per Tube QHS   levothyroxine  150 mcg Per Tube Q0600   multivitamin  1 tablet Per Tube QHS   Continuous Infusions:  cefTRIAXone (ROCEPHIN)  IV Stopped (01/14/22 1024)   dextrose 5% lactated ringers 50 mL/hr at 01/15/22 0900   heparin 1,350 Units/hr (01/15/22 0900)   vancomycin Stopped (01/13/22 1802)   PRN Meds: docusate, mouth rinse, polyethylene glycol   Vital Signs    Vitals:   01/15/22 0830 01/15/22 0845 01/15/22 0900 01/15/22 0915  BP: 127/74 131/71 136/67 128/74  Pulse: 75 70 74 66  Resp: (!) 22 19 (!) 21 13  Temp:      TempSrc:      SpO2: 100% 100% 100% 100%  Weight:      Height:        Intake/Output Summary (Last 24 hours) at 01/15/2022 0928 Last data filed at 01/15/2022 0900 Gross per 24 hour  Intake 1709.41 ml  Output 350 ml  Net 1359.41 ml       01/15/2022    5:00 AM 01/14/2022    5:00 AM 01/13/2022    6:07 PM  Last 3 Weights  Weight (lbs) 131 lb 9.8 oz 125 lb 3.5 oz 134 lb 11.2 oz  Weight (kg) 59.7 kg 56.8 kg 61.1 kg      Telemetry    V-paced - Personally Reviewed  ECG    No new ECG - Personally Reviewed  Physical Exam   GEN: Chronically ill appearing Neck: No JVD Cardiac: RRR, no murmurs Respiratory: Diminished breath sounds GI: Soft, nontender MS: No edema Neuro:  Oriented  x2 Psych: Normal affect   Labs    High Sensitivity Troponin:   Recent Labs  Lab 01/20/2022 1317 01/05/2022 1453  TROPONINIHS 2,957* 4,963*      Chemistry Recent Labs  Lab 12/27/2021 1012 01/24/2022 1029 01/13/22 1405 01/13/22 1654 01/14/22 0453 01/15/22 0445  NA 139   < > 137  --  138 138  K 3.3*   < > 3.3*  --  3.3* 3.2*  CL 98   < > 97*  --  96* 96*  CO2 29   < > 23  --  26 24  GLUCOSE 67*   < > 85  --  101* 111*  BUN 30*   < > 51*  --  23 35*  CREATININE 4.66*   < > 6.02*  --  3.31* 4.55*  CALCIUM 8.5*   < > 8.9  --  9.0 9.3  MG  --    < >  --  1.5* 1.6* 2.2  PROT 5.6*  --   --   --   --   --   ALBUMIN 2.8*  --  2.8*  --   --  2.5*  AST 59*  --   --   --   --   --   ALT 33  --   --   --   --   --   ALKPHOS 53  --   --   --   --   --   BILITOT 1.2  --   --   --   --   --   GFRNONAA 12*   < > 9*  --  17* 12*  ANIONGAP 12   < > 17*  --  16* 18*   < > = values in this interval not displayed.     Lipids  Recent Labs  Lab 01/13/22 0604  TRIG 111     Hematology Recent Labs  Lab 01/13/22 0604 01/14/22 0453 01/15/22 0445  WBC 7.9 7.6 6.2  RBC 2.83* 2.78* 2.74*  HGB 9.2* 9.0* 9.1*  HCT 28.0* 27.9* 27.4*  MCV 98.9 100.4* 100.0  MCH 32.5 32.4 33.2  MCHC 32.9 32.3 33.2  RDW 15.0 14.9 14.9  PLT 75* 82* 78*    Thyroid  Recent Labs  Lab 01/15/2022 1012 01/13/22 1405  TSH 5.816*  --   FREET4  --  1.00     BNP Recent Labs  Lab 01/22/2022 1012  BNP 1,904.9*     DDimer No results for input(s): "DDIMER" in the last 168 hours.   Radiology    DG Abd Portable 1V  Result Date: 01/13/2022 CLINICAL DATA:  OG tube placement EXAM: PORTABLE ABDOMEN - 1 VIEW COMPARISON:  01/13/2022 FINDINGS: OG tube tip in the mid to distal elongated stomach. No evidence of bowel obstruction. IMPRESSION: OG tube tip in the mid to distal stomach. Electronically Signed   By: Rolm Baptise M.D.   On: 01/13/2022 19:18   DG Abd Portable 1V  Result Date: 01/13/2022 CLINICAL DATA:   Encounter for orogastric tube placement. EXAM: PORTABLE ABDOMEN - 1 VIEW COMPARISON:  Radiograph earlier today. FINDINGS: The enteric tube tip and side port are below the diaphragm in the stomach, tip in the region of the gastric body. Nonobstructive upper abdominal bowel gas pattern. Pacemaker wires partially included. Bibasilar lung opacities, left-greater-than-right, better assessed on yesterday's CT. IMPRESSION: Enteric tube tip and side-port in the stomach, tip in the region of the gastric body. Electronically Signed   By: Keith Rake M.D.   On: 01/13/2022 18:55   DG Abd Portable 1V  Result Date: 01/13/2022 CLINICAL DATA:  Enteric tube placement in the setting of acute hypoxic respiratory failure requiring intubation and mechanical ventilation EXAM: PORTABLE ABDOMEN - 1 VIEW COMPARISON:  CT abdomen and pelvis dated 12/31/2021 FINDINGS: Enteric tube tip projects over the distal stomach/proximal duodenum Pacemaker wires terminate over the right atrium and right ventricle. Paucity of bowel gas. Bibasilar patchy opacities, better evaluated on prior CT. IMPRESSION: Enteric tube tip projects over the distal stomach/proximal duodenum. Electronically Signed   By: Darrin Nipper M.D.   On: 01/13/2022 16:22    Cardiac Studies     Patient Profile     88 y.o. male with a history of CAD, ESRD, PAD, COPD, chronic diastolic heart failure, pulmonary hypertension, dementia, OSA, hypertension, persistent atrial fibrillation, complete heart block status post PPM who we are consulted by Dr. Tacy Learn for evaluation of troponin elevation  Assessment & Plan    Elevated troponin: troponin elevation to 4963.  Suspect demand ischemia in setting of spetic shock and acute hypoxic respiratory failure, but likely in setting of underlying obstructive  CAD (cath from 2012 showed 50 to 60% ostial LAD, 50 to 60% mid LAD, 60% proximal RCA stenosis).  Echo 10/17 showed EF 45-50%, global hypokinesis, G3DD, moderate RV dysfunction,  severe biatrial enlargement -Continue heparin gtt x 48 hours (can stop this evening) -Continue ASA, statin once able to take PO -Given age and comorbidities including dementia and considering only mild systolic dysfunction on echo, would plan medical management unless having issues with angina as he recovers from acute illness.  Acute systolic heart failure: EF 45-50% on echo with global hypokinesis, suspect due to sepsis-induced cardiomyopathy but ischemia on differential as above -BP has been stable off pressors, can add low dose coreg once able to take PO  Septic shock: due to aspiration pneumonia.  Resolved, now off pressors.  Abx per primary team  Atrial fibrillation: in V-paced rhythm with underlying Afib.  Previously was on Eliquis but discontinued due to fall risk and prior subdural hematoma  ESRD: on HD  For questions or updates, please contact [place] Please consult [email or web address] for contact info under        Signed, Donato Heinz, MD  01/15/2022, 9:28 AM

## 2022-01-15 NOTE — Procedures (Signed)
Cortrak  Person Inserting Tube:  Ranell Patrick D, RD Tube Type:  Cortrak - 43 inches Tube Size:  10 Tube Location:  Left nare Secured by: Bridle Technique Used to Measure Tube Placement:  Marking at nare/corner of mouth Cortrak Secured At:  77 cm  Cortrak Tube Team Note:  Consult received to place a Cortrak feeding tube.   X-ray is required, abdominal x-ray has been ordered by the Cortrak team. Please confirm tube placement before using the Cortrak tube.   If the tube becomes dislodged please keep the tube and contact the Cortrak team at [email or web address] for replacement.  If after hours and replacement cannot be delayed, place a NG tube and confirm placement with an abdominal x-ray.    Ranell Patrick, RD, LDN Clinical Dietitian RD pager # available in [place]  After hours/weekend pager # available in [facility]

## 2022-01-15 NOTE — Progress Notes (Addendum)
PROGRESS NOTE   Jeffrey Campbell  WER:[id number]    DOB: [birth year]    DOA: 01/10/2022  PCP: Lawerance Cruel, MD   I have briefly reviewed patients previous medical records in [facility].  Chief Complaint  Patient presents with   Loss of Consciousness    Brief Narrative:  88 year old male with extensive PMH including ESRD on HD MWF, HTN, HLD, type II DM, hypothyroidism, COPD, OSA, PAD, CAD, complete heart block s/p PPM, MVP with MR, chronic diastolic CHF, dementia, persistent atrial flutter was admitted to the hospital on 01/20/2022 after being found unresponsive at home.  Recent admission 8/23 for altered mental status, UTI.  Patient had been tired and weak for 24 hours prior to admission and on morning of admission when daughter arrived at patient's home, patient was unresponsive making gurgling sounds.  In the ED minimally responsive with tachypnea, hypotension and required intubation.  Admitted to ICU by CCM.  Treated for septic shock secondary to streptococcal pneumonia and bacteremia, acute respiratory failure with hypoxia, acute septic encephalopathy complicating underlying dementia and elevated troponin secondary to demand ischemia.  PCCM, cardiology and nephrology consulted.  Extubated 10/19.  Care transferred to [facility] 10/20.   Assessment & Plan:  Principal Problem:   Encephalopathy Active Problems:   Sepsis ([place])   Acute respiratory failure with hypoxia (HCC)   Protein-calorie malnutrition, severe   Non-ST elevation (NSTEMI) myocardial infarction [facility])   Persistent atrial fibrillation (HCC)   Acute systolic heart failure (HCC)   Septic shock due to streptococcal pneumonia and bacteremia, POA: Briefly on pressors on 10/17 but has been off of pressors since.  Shock resolved.  Blood cultures from 10/17: Streptococcus pneumonia, sensitive to ceftriaxone (known meningitis) and vancomycin.  RVP negative.  Sputum culture pending.  Will discuss with ID regarding surveillance blood  cultures and narrowing his antibiotics based on sensitivities.  Acute respiratory failure with hypoxia: Due to streptococcal pneumonia and acute encephalopathy.  Extubated 01/13/2022.  Currently stable on [place] oxygen 3 L/min.  Acute septic encephalopathy: Due to pneumonia and bacteremia complicating underlying dementia.  Baseline mental status is not known.  No focal deficits.  EEG without seizure activity. Remains lethargic and somnolent.  Avoid sedative medications.  Delirium precautions.  Aspiration precautions.  Elevated troponin due to suspected demand ischemia: Cardiology following.  Troponin peaked to 4963.  Cardiology suspects demand ischemia in the setting of septic shock and acute hypoxic respiratory failure, but likely has underlying obstructive CAD from prior cath in 2012.  TTE 10/17: LVEF 45-50%, global hypokinesis and grade 3 diastolic dysfunction.  Completing IV heparin x48 hours on 10/20 evening.  Continue aspirin and statins once able to take p.o.  Due to advanced age and comorbidities including dementia, Cardiology planning conservative medical management unless he develops angina.  Acute systolic CHF: LVEF 19-50% with global hypokinesis.  Cardiology suspecting sepsis induced cardiomyopathy but ischemia remains on differentials.  When able to take p.o., add low-dose carvedilol.  Persistent A-fib: V paced rhythm with underlying A-fib on telemetry.  Not on Eliquis any longer due to fall risk and prior subdural hematoma.  ESRD on HD MWF: Nephrology following and currently getting HD at bedside in the ICU.  Hypertension: Per nephrology, 5 kg over dry weight but does not appear clinically volume overloaded.  Reducing IV D5.  Anemia of CKD: Hemoglobin stable.  Type II DM: No further hypoglycemic episodes, cutting back on IV D5 infusion.  COPD: No clinical exacerbation.  Dementia: Patient on donezepil 10 Mg  at bedtime, resume when able to take p.o.  Acquired hypothyroidism: TSH 5.8,  normal free T4.  T3 pending.  When able to take p.o., resume levothyroxine 150 mcg daily.  Electrolytes (hypokalemia, hypomagnesemia, hypophosphatemia): Management per nephrology.  Low magnesium and phosphate have corrected.  Body mass index is 19.44 kg/m.  Nutritional Status Nutrition Problem: Severe Malnutrition Etiology: chronic illness (ESRD on HD, COPD) Signs/Symptoms: severe fat depletion, moderate muscle depletion, severe muscle depletion Interventions: Tube feeding, MVI  Pressure Ulcer: Pressure Injury 09/02/18 Sacrum Mid Stage I -  Intact skin with non-blanchable redness of a localized area usually over a bony prominence. (Active)  09/02/18 0017  Location: Sacrum  Location Orientation: Mid  Staging: Stage I -  Intact skin with non-blanchable redness of a localized area usually over a bony prominence.  Wound Description (Comments):   Present on Admission: Yes  Dressing Type Foam - Lift dressing to assess site every shift 01/14/22 2000     DVT prophylaxis: SCDs Start: 01/11/2022 1256     Code Status: Full Code:  Family Communication: Daughter via phone Disposition:  Status is: Inpatient Remains inpatient appropriate because: Remains encephalopathic, not safe to take p.o., IV heparin drip, IV antibiotics   As per discussion with daughter via phone 10/20, patient lives alone but she stays with him 5 days a week and the other 2 days, he has caregivers to assist him.  He ambulates independently without a cane or a walker.  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  He has some cognitive impairment but able to carry on conversations, does have period's of confusion but no agitation.  She visited her on 10/19 and he seemed to be doing better than on admission.  Consultants:   PCCM Nephrology Cardiology  Procedures:   Extubated 10/18  Antimicrobials:   IV vancomycin 10/17 > IV cefepime x1 dose on 10/17 IV ceftriaxone 10/18 >   Subjective:  Patient quite  somnolent, barely arousable, says his name and then mumbles incomprehensibly.  Does not follow any instructions.  Does have purposeful movements of both upper extremities.  Undergoing dialysis at bedside.  Seen and examined along with dialysis RN and patient's ICU RN at bedside.  Objective:   Vitals:   01/15/22 0930 01/15/22 0945 01/15/22 1000 01/15/22 1015  BP: 106/66 105/63 98/66 125/62  Pulse: 80 73 70 79  Resp: 19 (!) 22 20 (!) 23  Temp:      TempSrc:      SpO2: 100% 100% 100% 100%  Weight:      Height:        General exam: Elderly male, moderately built and frail, chronically ill looking, lying comfortably propped up in bed without distress. Respiratory system: Clear to auscultation anteriorly, diminished in the bases with occasional basal crackles.  Rest clear to auscultation.  No increased work of breathing. Cardiovascular system: S1 & S2 heard, irregularly irregular. No JVD, murmurs, rubs, gallops or clicks. No pedal edema.  Telemetry personally reviewed: Underlying A-fib, rate controlled, AV paced rhythm. Gastrointestinal system: Abdomen is nondistended, soft and nontender. No organomegaly or masses felt. Normal bowel sounds heard. Central nervous system: Mental status as noted above. No focal neurological deficits. Extremities: He is to have symmetric normal power in upper extremities based on a spontaneous movements.  Did not see moving his lower extremities.  Left upper arm AV fistula. Skin: No rashes, lesions or ulcers Psychiatry: Judgement and insight impaired. Mood & affect cannot be assessed.  Data Reviewed:   I have personally reviewed following labs and imaging studies   CBC: Recent Labs  Lab 01/20/2022 1012 01/01/2022 1029 01/13/22 0604 01/14/22 0453 01/15/22 0445  WBC 7.0  --  7.9 7.6 6.2  NEUTROABS 5.6  --   --   --   --   HGB 9.6*   < > 9.2* 9.0* 9.1*  HCT 30.5*   < > 28.0* 27.9* 27.4*  MCV 103.7*  --  98.9 100.4* 100.0  PLT 94*  --  75* 82* 78*   < >  = values in this interval not displayed.    Basic Metabolic Panel: Recent Labs  Lab 01/23/2022 1012 12/29/2021 1029 01/15/2022 1137 12/30/2021 1146 01/11/2022 1453 01/10/2022 1453 01/13/22 0604 01/13/22 1405 01/13/22 1654 01/14/22 0453 01/15/22 0445  NA 139 138   < > 139  --   --  138 137  --  138 138  K 3.3* 3.3*   < > 3.3*  --   --  3.4* 3.3*  --  3.3* 3.2*  CL 98 96*  --   --   --   --  97* 97*  --  96* 96*  CO2 29  --   --   --   --   --  24 23  --  26 24  GLUCOSE 67* 68*  --   --   --   --  66* 85  --  101* 111*  BUN 30* 29*  --   --   --   --  46* 51*  --  23 35*  CREATININE 4.66* 4.80*  --   --   --   --  5.59* 6.02*  --  3.31* 4.55*  CALCIUM 8.5*  --   --   --   --   --  8.9 8.9  --  9.0 9.3  MG  --   --   --   --  1.5*  --  1.7  --  1.5* 1.6* 2.2  PHOS  --   --   --   --  3.8   < > 3.9 4.3 1.7* 3.8 3.5  3.5   < > = values in this interval not displayed.    Liver Function Tests: Recent Labs  Lab 01/24/2022 1012 01/13/22 1405 01/15/22 0445  AST 59*  --   --   ALT 33  --   --   ALKPHOS 53  --   --   BILITOT 1.2  --   --   PROT 5.6*  --   --   ALBUMIN 2.8* 2.8* 2.5*    CBG: Recent Labs  Lab 01/14/22 2308 01/15/22 0336 01/15/22 0715  GLUCAP 96 102* 125*    Microbiology Studies:   Recent Results (from the past 240 hour(s))  Blood Culture (routine x 2)     Status: Abnormal   Collection Time: 01/13/2022  9:58 AM   Specimen: BLOOD RIGHT ARM  Result Value Ref Range Status   Specimen Description BLOOD RIGHT ARM  Final   Special Requests   Final    BOTTLES DRAWN AEROBIC AND ANAEROBIC Blood Culture adequate volume   Culture  Setup Time   Final    GRAM POSITIVE COCCI IN BOTH AEROBIC AND ANAEROBIC BOTTLES CRITICAL RESULT CALLED TO, READ BACK BY AND VERIFIED WITH: V BRYK,PHARMD'@0335'$  01/13/22 [place] Performed at [facility], [place] [address] [place], [place] [address]    Culture STREPTOCOCCUS PNEUMONIAE (A)  Final  Report Status 01/15/2022 FINAL  Final   Organism  ID, Bacteria STREPTOCOCCUS PNEUMONIAE  Final      Susceptibility   Streptococcus pneumoniae - MIC*    ERYTHROMYCIN <=0.12 SENSITIVE Sensitive     LEVOFLOXACIN 0.5 SENSITIVE Sensitive     VANCOMYCIN 0.5 SENSITIVE Sensitive     PENICILLIN (meningitis) 1 RESISTANT Resistant     PENO - penicillin 1      PENICILLIN (non-meningitis) 1 SENSITIVE Sensitive     PENICILLIN (oral) 1 INTERMEDIATE Intermediate     CEFTRIAXONE (non-meningitis) 1 SENSITIVE Sensitive     CEFTRIAXONE (meningitis) 1 INTERMEDIATE Intermediate     * STREPTOCOCCUS PNEUMONIAE  Blood Culture ID Panel (Reflexed)     Status: Abnormal   Collection Time: 12/27/2021  9:58 AM  Result Value Ref Range Status   Enterococcus faecalis NOT DETECTED NOT DETECTED Final   Enterococcus Faecium NOT DETECTED NOT DETECTED Final   Listeria monocytogenes NOT DETECTED NOT DETECTED Final   Staphylococcus species NOT DETECTED NOT DETECTED Final   Staphylococcus aureus (BCID) NOT DETECTED NOT DETECTED Final   Staphylococcus epidermidis NOT DETECTED NOT DETECTED Final   Staphylococcus lugdunensis NOT DETECTED NOT DETECTED Final   Streptococcus species DETECTED (A) NOT DETECTED Final    Comment: CRITICAL RESULT CALLED TO, READ BACK BY AND VERIFIED WITH: V BRYK,PHARMD'@0330'$  01/13/22 [place]    Streptococcus agalactiae NOT DETECTED NOT DETECTED Final   Streptococcus pneumoniae DETECTED (A) NOT DETECTED Final    Comment: CRITICAL RESULT CALLED TO, READ BACK BY AND VERIFIED WITH: V BRYK,PHARMD'@0330'$  01/13/22 [place]    Streptococcus pyogenes NOT DETECTED NOT DETECTED Final   A.calcoaceticus-baumannii NOT DETECTED NOT DETECTED Final   Bacteroides fragilis NOT DETECTED NOT DETECTED Final   Enterobacterales NOT DETECTED NOT DETECTED Final   Enterobacter cloacae complex NOT DETECTED NOT DETECTED Final   Escherichia coli NOT DETECTED NOT DETECTED Final   Klebsiella aerogenes NOT DETECTED NOT DETECTED Final   Klebsiella oxytoca NOT DETECTED NOT DETECTED Final    Klebsiella pneumoniae NOT DETECTED NOT DETECTED Final   Proteus species NOT DETECTED NOT DETECTED Final   Salmonella species NOT DETECTED NOT DETECTED Final   Serratia marcescens NOT DETECTED NOT DETECTED Final   Haemophilus influenzae NOT DETECTED NOT DETECTED Final   Neisseria meningitidis NOT DETECTED NOT DETECTED Final   Pseudomonas aeruginosa NOT DETECTED NOT DETECTED Final   Stenotrophomonas maltophilia NOT DETECTED NOT DETECTED Final   Candida albicans NOT DETECTED NOT DETECTED Final   Candida auris NOT DETECTED NOT DETECTED Final   Candida glabrata NOT DETECTED NOT DETECTED Final   Candida krusei NOT DETECTED NOT DETECTED Final   Candida parapsilosis NOT DETECTED NOT DETECTED Final   Candida tropicalis NOT DETECTED NOT DETECTED Final   Cryptococcus neoformans/gattii NOT DETECTED NOT DETECTED Final    Comment: Performed at [facility], [address] [place], [place] [address]  Blood Culture (routine x 2)     Status: Abnormal   Collection Time: 12/27/2021 10:03 AM   Specimen: BLOOD RIGHT HAND  Result Value Ref Range Status   Specimen Description BLOOD RIGHT HAND  Final   Special Requests   Final    BOTTLES DRAWN AEROBIC ONLY Blood Culture results may not be optimal due to an inadequate volume of blood received in culture bottles   Culture  Setup Time   Final    GRAM POSITIVE COCCI IN PAIRS AEROBIC BOTTLE ONLY CRITICAL VALUE NOTED.  VALUE IS CONSISTENT WITH PREVIOUSLY REPORTED AND CALLED VALUE.    Culture (  A)  Final    STREPTOCOCCUS PNEUMONIAE SUSCEPTIBILITIES PERFORMED ON PREVIOUS CULTURE WITHIN THE LAST 5 DAYS. Performed at [facility], [place] [address], [place], [place] [address]    Report Status 01/15/2022 FINAL  Final  Respiratory (~20 pathogens) panel by PCR     Status: None   Collection Time: 01/08/2022  1:03 PM   Specimen: Tracheal Aspirate; Respiratory  Result Value Ref Range Status   Adenovirus NOT DETECTED NOT DETECTED Final   Coronavirus 229E NOT  DETECTED NOT DETECTED Final    Comment: (NOTE) The Coronavirus on the Respiratory Panel, DOES NOT test for the novel  Coronavirus (2019 nCoV)    Coronavirus HKU1 NOT DETECTED NOT DETECTED Final   Coronavirus NL63 NOT DETECTED NOT DETECTED Final   Coronavirus OC43 NOT DETECTED NOT DETECTED Final   Metapneumovirus NOT DETECTED NOT DETECTED Final   Rhinovirus / Enterovirus NOT DETECTED NOT DETECTED Final   Influenza A NOT DETECTED NOT DETECTED Final   Influenza B NOT DETECTED NOT DETECTED Final   Parainfluenza Virus 1 NOT DETECTED NOT DETECTED Final   Parainfluenza Virus 2 NOT DETECTED NOT DETECTED Final   Parainfluenza Virus 3 NOT DETECTED NOT DETECTED Final   Parainfluenza Virus 4 NOT DETECTED NOT DETECTED Final   Respiratory Syncytial Virus NOT DETECTED NOT DETECTED Final   Bordetella pertussis NOT DETECTED NOT DETECTED Final   Bordetella Parapertussis NOT DETECTED NOT DETECTED Final   Chlamydophila pneumoniae NOT DETECTED NOT DETECTED Final   Mycoplasma pneumoniae NOT DETECTED NOT DETECTED Final    Comment: Performed at [facility], [place]. [address], [place], [place] [address]  MRSA Next Gen by PCR, Nasal     Status: None   Collection Time: 01/07/2022  2:40 PM   Specimen: Nasal Mucosa; Nasal Swab  Result Value Ref Range Status   MRSA by PCR Next Gen NOT DETECTED NOT DETECTED Final    Comment: (NOTE) The GeneXpert MRSA Assay (FDA approved for NASAL specimens only), is one component of a comprehensive MRSA colonization surveillance program. It is not intended to diagnose MRSA infection nor to guide or monitor treatment for MRSA infections. Test performance is not FDA approved in patients less than 59 years old. Performed at [facility], [place] [address], [place], [place] [address]     Radiology Studies:  DG Abd Portable 1V  Result Date: 01/13/2022 CLINICAL DATA:  OG tube placement EXAM: PORTABLE ABDOMEN - 1 VIEW COMPARISON:  01/13/2022 FINDINGS: OG tube tip in the mid  to distal elongated stomach. No evidence of bowel obstruction. IMPRESSION: OG tube tip in the mid to distal stomach. Electronically Signed   By: Rolm Baptise M.D.   On: 01/13/2022 19:18   DG Abd Portable 1V  Result Date: 01/13/2022 CLINICAL DATA:  Encounter for orogastric tube placement. EXAM: PORTABLE ABDOMEN - 1 VIEW COMPARISON:  Radiograph earlier today. FINDINGS: The enteric tube tip and side port are below the diaphragm in the stomach, tip in the region of the gastric body. Nonobstructive upper abdominal bowel gas pattern. Pacemaker wires partially included. Bibasilar lung opacities, left-greater-than-right, better assessed on yesterday's CT. IMPRESSION: Enteric tube tip and side-port in the stomach, tip in the region of the gastric body. Electronically Signed   By: Keith Rake M.D.   On: 01/13/2022 18:55   DG Abd Portable 1V  Result Date: 01/13/2022 CLINICAL DATA:  Enteric tube placement in the setting of acute hypoxic respiratory failure requiring intubation and mechanical ventilation EXAM: PORTABLE ABDOMEN - 1  VIEW COMPARISON:  CT abdomen and pelvis dated 01/11/2022 FINDINGS: Enteric tube tip projects over the distal stomach/proximal duodenum Pacemaker wires terminate over the right atrium and right ventricle. Paucity of bowel gas. Bibasilar patchy opacities, better evaluated on prior CT. IMPRESSION: Enteric tube tip projects over the distal stomach/proximal duodenum. Electronically Signed   By: Darrin Nipper M.D.   On: 01/13/2022 16:22    Scheduled Meds:    aspirin EC  81 mg Oral Daily   atorvastatin  20 mg Per Tube Daily   calcitRIOL  0.5 mcg Oral Q M,W,F-HD   Chlorhexidine Gluconate Cloth  6 each Topical Daily   Chlorhexidine Gluconate Cloth  6 each Topical Q0600   Chlorhexidine Gluconate Cloth  6 each Topical Q0600   cinacalcet  90 mg Oral Q supper   darbepoetin (ARANESP) injection - DIALYSIS  100 mcg Intravenous Q Fri-HD   donepezil  10 mg Per Tube QHS   levothyroxine  150 mcg  Per Tube Q0600   multivitamin  1 tablet Per Tube QHS    Continuous Infusions:    cefTRIAXone (ROCEPHIN)  IV Stopped (01/14/22 1024)   dextrose 5% lactated ringers 50 mL/hr at 01/15/22 0900   heparin 1,350 Units/hr (01/15/22 0900)   vancomycin Stopped (01/13/22 1802)     LOS: 3 days     Vernell Leep, MD,  FACP, [place], [facility], [facility], [place]     To contact the attending provider between 7A-7P or the covering provider during after hours 7P-7A, please log into the web site [email or web address] and access using universal [place] password for that web site. If you do not have the password, please call the hospital operator.  01/15/2022, 10:26 AM

## 2022-01-15 NOTE — Progress Notes (Signed)
SLP Cancellation Note  Patient Details Name: Jeffrey Campbell MRN: [id number] DOB: 1932-08-27   Cancelled treatment:        Attempted to see pt for ongoing swallowing assessment.  Pt was receiving HD in room; does not have catheter in place.  HD nurse indicated there was around 2 h 8mleft in treatment time. SLP will return as schedule permits.   LCeledonio Savage, M[place] [facility] Office: [phone]/20/2023, 10:21 AM

## 2022-01-15 NOTE — Progress Notes (Signed)
Speech Language Pathology Treatment: Dysphagia  Patient Details Name: Jeffrey Campbell MRN: [id number] DOB: [birth year] Today's Date: 01/15/2022 Time: 5176-1607 SLP Time Calculation (min) (ACUTE ONLY): 8 min  Assessment / Plan / Recommendation Clinical Impression  Significantly cachectic pt seen for dysphagia management with audible congested respirations and weak appearing upon arrival. He produced soft throat clears when requested to cough. Use of [place] mobilized moderate amount of presumed pharyngeal/laryngeal secretions. Delayed onset of swallow with ice chip then 1/2 teaspoon water boluses with inconsistent delayed reflexive throat clears. Given weakness, decreased awareness, opportunity for aspiration is high presently and recommended alternate means of nutrition (has Cortrak) in hopes of increasing strength/overall endurance and initiation of po versus instrumental assessment as appropriate. Recommend follow up first of next week for pt's status as schedule allows.    HPI HPI: 88 yo male presenting to ED on 11/16/21 with generalized weakness and confusion. Admitted for acute infectious encephalopathy secondary to COVID infection plus possible UTI. Intubated 10/17-18. PMH including HTN, HLD, T2DM, hypothyroidism, COPD, OSA, HFpEF, PAD, CAD, complete heart block s/p pacemaker, mitral valve prolapse with mitral regurgitation, ESRD on MWF HD, and dementia.      SLP Plan  Continue with current plan of care      Recommendations for follow up therapy are one component of a multi-disciplinary discharge planning process, led by the attending physician.  Recommendations may be updated based on patient status, additional functional criteria and insurance authorization.    Recommendations  Diet recommendations: NPO Medication Administration: Via alternative means                Oral Care Recommendations: Oral care QID Follow Up Recommendations:  (TBD) Assistance recommended at discharge:  Frequent or constant Supervision/Assistance SLP Visit Diagnosis: Dysphagia, unspecified (R13.10) Plan: Continue with current plan of care           Houston Siren  01/15/2022, 3:17 PM Orbie Pyo Colvin Caroli.Ed Product/process development scientist [phone]

## 2022-01-15 NOTE — Progress Notes (Signed)
OT Cancellation Note  Patient Details Name: Jeffrey Campbell MRN: [id number] DOB: 1932/08/29   Cancelled Treatment:    Reason Eval/Treat Not Completed: Patient at procedure or test/ unavailable Currently receiving HD in room. Will follow up for OT eval as schedule permits.  Layla Maw 01/15/2022, 10:33 AM

## 2022-01-15 NOTE — Progress Notes (Signed)
Brief Nutrition Note  Discussed pt with RN and during ICU rounds. Cortrak placed today. Abdominal x-ray to confirm placement is pending. RD to order tube feeding recommendations from Follow-up note dated 01/14/22 as these remain appropriate.  Once placement is confirmed, initiate enteral nutrition via Cortrak: - Start Peptamen 1.5 @ 20 ml/hr and advance by 10 ml q 6 hours to goal rate of 50 ml/hr (1200 ml/day) - PROSource TF20 60 ml daily  Tube feeding regimen at goal rate provides 1880 kcal, 102 grams of protein, and 923 ml of H2O.   RD will continue to follow pt during admission.   Gustavus Bryant, MS, RD, LDN Inpatient Clinical Dietitian Please see AMiON for contact information.

## 2022-01-16 ENCOUNTER — Inpatient Hospital Stay (HOSPITAL_COMMUNITY): Payer: Medicare Other

## 2022-01-16 DIAGNOSIS — I469 Cardiac arrest, cause unspecified: Secondary | ICD-10-CM | POA: Insufficient documentation

## 2022-01-16 DIAGNOSIS — R579 Shock, unspecified: Secondary | ICD-10-CM | POA: Insufficient documentation

## 2022-01-16 DIAGNOSIS — I5021 Acute systolic (congestive) heart failure: Secondary | ICD-10-CM | POA: Diagnosis not present

## 2022-01-16 DIAGNOSIS — J988 Other specified respiratory disorders: Secondary | ICD-10-CM | POA: Diagnosis not present

## 2022-01-16 DIAGNOSIS — J9601 Acute respiratory failure with hypoxia: Secondary | ICD-10-CM | POA: Diagnosis not present

## 2022-01-16 DIAGNOSIS — A419 Sepsis, unspecified organism: Secondary | ICD-10-CM | POA: Diagnosis not present

## 2022-01-16 DIAGNOSIS — G9340 Encephalopathy, unspecified: Secondary | ICD-10-CM | POA: Diagnosis not present

## 2022-01-16 DIAGNOSIS — I214 Non-ST elevation (NSTEMI) myocardial infarction: Secondary | ICD-10-CM | POA: Diagnosis not present

## 2022-01-16 DIAGNOSIS — J90 Pleural effusion, not elsewhere classified: Secondary | ICD-10-CM

## 2022-01-16 LAB — GLUCOSE, CAPILLARY
Glucose-Capillary: 169 mg/dL — ABNORMAL HIGH (ref 70–99)
Glucose-Capillary: 175 mg/dL — ABNORMAL HIGH (ref 70–99)
Glucose-Capillary: 178 mg/dL — ABNORMAL HIGH (ref 70–99)
Glucose-Capillary: 147 mg/dL — ABNORMAL HIGH (ref 70–99)
Glucose-Capillary: 122 mg/dL — ABNORMAL HIGH (ref 70–99)
Glucose-Capillary: 134 mg/dL — ABNORMAL HIGH (ref 70–99)

## 2022-01-16 LAB — POCT I-STAT 7, (LYTES, BLD GAS, ICA,H+H)
Acid-Base Excess: 4 mmol/L — ABNORMAL HIGH (ref 0.0–2.0)
Bicarbonate: 27.5 mmol/L (ref 20.0–28.0)
Calcium, Ion: 1.36 mmol/L (ref 1.15–1.40)
HCT: 29 % — ABNORMAL LOW (ref 39.0–52.0)
Hemoglobin: 9.9 g/dL — ABNORMAL LOW (ref 13.0–17.0)
O2 Saturation: 96 %
Patient temperature: 98.4
Potassium: 2.8 mmol/L — ABNORMAL LOW (ref 3.5–5.1)
Sodium: 138 mmol/L (ref 135–145)
TCO2: 29 mmol/L (ref 22–32)
pCO2 arterial: 35.4 mmHg (ref 32–48)
pH, Arterial: 7.497 — ABNORMAL HIGH (ref 7.35–7.45)
pO2, Arterial: 73 mmHg — ABNORMAL LOW (ref 83–108)

## 2022-01-16 MED ORDER — ACETYLCYSTEINE 20 % IN SOLN
3.0000 mL | Freq: Three times a day (TID) | RESPIRATORY_TRACT | Status: DC
  Administered 2022-01-17 – 2022-01-23 (×20): 3 mL via RESPIRATORY_TRACT
  Filled 2022-01-16 (×23): qty 4

## 2022-01-16 MED ORDER — ROCURONIUM BROMIDE 10 MG/ML (PF) SYRINGE
PREFILLED_SYRINGE | INTRAVENOUS | Status: AC
  Filled 2022-01-16: qty 10

## 2022-01-16 MED ORDER — ETOMIDATE 2 MG/ML IV SOLN
INTRAVENOUS | Status: AC
  Filled 2022-01-16: qty 20

## 2022-01-16 MED ORDER — ORAL CARE MOUTH RINSE
15.0000 mL | OROMUCOSAL | Status: DC
  Administered 2022-01-16 – 2022-01-17 (×6): 15 mL via OROMUCOSAL

## 2022-01-16 MED ORDER — CALCITRIOL 1 MCG/ML PO SOLN: 0.5000 ug | ORAL | Status: DC

## 2022-01-16 MED ORDER — KETAMINE HCL 50 MG/5ML IJ SOSY
PREFILLED_SYRINGE | INTRAMUSCULAR | Status: AC
  Filled 2022-01-16: qty 5

## 2022-01-16 MED ORDER — ROCURONIUM BROMIDE 50 MG/5ML IV SOLN
100.0000 mg | Freq: Once | INTRAVENOUS | Status: AC
  Filled 2022-01-16: qty 10

## 2022-01-16 MED ORDER — ACETYLCYSTEINE 20 % IN SOLN: 3.0000 mL | Freq: Two times a day (BID) | RESPIRATORY_TRACT | Status: DC

## 2022-01-16 MED ORDER — CARVEDILOL 3.125 MG PO TABS: 3.1250 mg | ORAL_TABLET | Freq: Two times a day (BID) | ORAL | Status: DC

## 2022-01-16 MED ORDER — ETOMIDATE 2 MG/ML IV SOLN: 20.0000 mg | Freq: Once | INTRAVENOUS | Status: AC

## 2022-01-16 MED ORDER — BRIMONIDINE TARTRATE 0.2 % OP SOLN
1.0000 [drp] | Freq: Two times a day (BID) | OPHTHALMIC | Status: DC
  Administered 2022-01-16 – 2022-01-30 (×27): 1 [drp] via OPHTHALMIC
  Filled 2022-01-16 (×3): qty 5

## 2022-01-16 MED ORDER — POTASSIUM CHLORIDE 10 MEQ/100ML IV SOLN
10.0000 meq | INTRAVENOUS | Status: AC
  Administered 2022-01-16 (×4): 10 meq via INTRAVENOUS
  Filled 2022-01-16 (×4): qty 100

## 2022-01-16 MED ORDER — FENTANYL CITRATE PF 50 MCG/ML IJ SOSY
PREFILLED_SYRINGE | INTRAMUSCULAR | Status: AC
  Filled 2022-01-16: qty 2

## 2022-01-16 MED ORDER — CALCITRIOL 1 MCG/ML PO SOLN
0.5000 ug | ORAL | Status: DC
  Administered 2022-01-18 – 2022-01-20 (×2): 0.5 ug
  Filled 2022-01-16 (×2): qty 0.5

## 2022-01-16 MED ORDER — SODIUM CHLORIDE 0.9 % IV SOLN
1.5000 g | Freq: Two times a day (BID) | INTRAVENOUS | Status: DC
  Administered 2022-01-16 – 2022-01-17 (×2): 1.5 g via INTRAVENOUS
  Filled 2022-01-16 (×4): qty 4

## 2022-01-16 MED ORDER — DEXMEDETOMIDINE HCL IN NACL 400 MCG/100ML IV SOLN
0.4000 ug/kg/h | INTRAVENOUS | Status: DC
  Administered 2022-01-16: 0.4 ug/kg/h via INTRAVENOUS
  Administered 2022-01-16: 0.7 ug/kg/h via INTRAVENOUS
  Administered 2022-01-17: 0.5 ug/kg/h via INTRAVENOUS
  Filled 2022-01-16 (×3): qty 100

## 2022-01-16 MED ORDER — PANTOPRAZOLE 2 MG/ML SUSPENSION
40.0000 mg | Freq: Every day | ORAL | Status: DC
  Administered 2022-01-16 – 2022-01-25 (×10): 40 mg
  Filled 2022-01-16 (×11): qty 20

## 2022-01-16 MED ORDER — NOREPINEPHRINE 4 MG/250ML-% IV SOLN
INTRAVENOUS | Status: AC
  Filled 2022-01-16: qty 250

## 2022-01-16 MED ORDER — NOREPINEPHRINE 4 MG/250ML-% IV SOLN
0.0000 ug/min | INTRAVENOUS | Status: DC
  Administered 2022-01-16: 3 ug/min via INTRAVENOUS
  Administered 2022-01-17: 2 ug/min via INTRAVENOUS
  Filled 2022-01-16: qty 250

## 2022-01-16 MED ORDER — SODIUM CHLORIDE 0.9 % IV BOLUS
250.0000 mL | Freq: Once | INTRAVENOUS | Status: AC
  Administered 2022-01-16: 250 mL via INTRAVENOUS

## 2022-01-16 MED ORDER — ACETYLCYSTEINE 20 % IN SOLN
3.0000 mL | Freq: Three times a day (TID) | RESPIRATORY_TRACT | Status: DC
  Administered 2022-01-16 (×3): 3 mL via RESPIRATORY_TRACT
  Filled 2022-01-16 (×3): qty 4

## 2022-01-16 MED ORDER — MIDAZOLAM HCL 2 MG/2ML IJ SOLN
INTRAMUSCULAR | Status: AC
  Filled 2022-01-16: qty 2

## 2022-01-16 MED ORDER — BRINZOLAMIDE 1 % OP SUSP
1.0000 [drp] | Freq: Two times a day (BID) | OPHTHALMIC | Status: DC
  Administered 2022-01-16 – 2022-01-30 (×25): 1 [drp] via OPHTHALMIC
  Filled 2022-01-16 (×4): qty 10

## 2022-01-16 MED ORDER — EPINEPHRINE 1 MG/10ML IJ SOSY
PREFILLED_SYRINGE | INTRAMUSCULAR | Status: AC
  Filled 2022-01-16: qty 10

## 2022-01-16 MED ORDER — MEMANTINE HCL 10 MG PO TABS
10.0000 mg | ORAL_TABLET | Freq: Two times a day (BID) | ORAL | Status: DC
  Administered 2022-01-16 – 2022-01-30 (×28): 10 mg
  Filled 2022-01-16 (×32): qty 1

## 2022-01-16 MED ORDER — CALCITRIOL 0.25 MCG PO CAPS: 0.5000 ug | ORAL_CAPSULE | ORAL | Status: DC

## 2022-01-16 MED ORDER — CARVEDILOL 3.125 MG PO TABS
3.1250 mg | ORAL_TABLET | Freq: Two times a day (BID) | ORAL | Status: DC
  Filled 2022-01-16: qty 1

## 2022-01-16 MED ORDER — ORAL CARE MOUTH RINSE: 15.0000 mL | OROMUCOSAL | Status: DC | PRN

## 2022-01-16 MED ORDER — SUCCINYLCHOLINE CHLORIDE 200 MG/10ML IV SOSY
PREFILLED_SYRINGE | INTRAVENOUS | Status: AC
  Filled 2022-01-16: qty 10

## 2022-01-16 MED ORDER — SODIUM CHLORIDE 0.9 % IV SOLN: INTRAVENOUS | Status: DC | PRN

## 2022-01-16 NOTE — Progress Notes (Signed)
   01/16/22 1020  Clinical Encounter Type  Visited With Family  Visit Type Initial;Spiritual support  Referral From Nurse  Consult/Referral To Chaplain   Chaplain responded to [place] due to heart failure involving 88 year old patient who had been transferred to 55M. Family arrived (daughter and husband) and was able to speak with Chaplain and medical staff. Patient was stabilized and Chaplain provided emotional and spiritual support via prayer and provided hospitality. Chaplain remains available to support family as needed.   Melody Haver, Chaplain Resident [phone]

## 2022-01-16 NOTE — Procedures (Signed)
Procedure Name: Intubation Date/Time: 01/16/2022 11:47 AM  Performed by: Jesse Sans, RRTPre-anesthesia Checklist: Patient identified, Suction available, Patient being monitored and Timeout performed Oxygen Delivery Method: Ambu bag Preoxygenation: Pre-oxygenation with 100% oxygen Induction Type: Rapid sequence Ventilation: Mask ventilation without difficulty Laryngoscope Size: Mac and 4 Tube type: Non-subglottic suction tube Tube size: 7.5 mm Number of attempts: 1 Airway Equipment and Method: Stylet Placement Confirmation: ETT inserted through vocal cords under direct vision, CO2 detector, Positive ETCO2 and Breath sounds checked- equal and bilateral Secured at: 25 cm Tube secured with: ETT holder

## 2022-01-16 NOTE — Procedures (Signed)
Intubation Procedure Note  Jeffrey Campbell  [id number]  1932-10-20  Date:01/16/22  Time:1:51 PM   Provider Performing:Vallory Oetken    Procedure: Intubation (31500)  Indication(s) Respiratory Failure  Consent Unable to obtain consent due to emergent nature of procedure.   Anesthesia Etomidate and Rocuronium   Time Out Verified patient identification, verified procedure, site/side was marked, verified correct patient position, special equipment/implants available, medications/allergies/relevant history reviewed, required imaging and test results available.   Sterile Technique Usual hand hygeine, masks, and gloves were used   Procedure Description Patient positioned in bed supine.  Sedation given as noted above.  Patient was intubated with endotracheal tube using  MAC 4 .  View was Grade 1 full glottis .  Number of attempts was 1.  Colorimetric CO2 detector was consistent with tracheal placement.   Complications/Tolerance None; patient tolerated the procedure well. Chest X-ray is ordered to verify placement.   EBL Minimal   Specimen(s) None

## 2022-01-16 NOTE — Progress Notes (Addendum)
PROGRESS NOTE   Jeffrey Campbell  WIO:[id number]    DOB: [birth year]    DOA: 01/04/2022  PCP: Lawerance Cruel, MD   I have briefly reviewed patients previous medical records in [facility].  Chief Complaint  Patient presents with   Loss of Consciousness    Brief Narrative:  88 year old male with extensive PMH including ESRD on HD MWF, HTN, HLD, type II DM, hypothyroidism, COPD, OSA, PAD, CAD, complete heart block s/p PPM, MVP with MR, chronic diastolic CHF, dementia, persistent atrial flutter was admitted to the hospital on 01/19/2022 after being found unresponsive at home.  Recent admission 8/23 for altered mental status, UTI.  Patient had been tired and weak for 24 hours prior to admission and on morning of admission when daughter arrived at patient's home, patient was unresponsive making gurgling sounds.  In the ED minimally responsive with tachypnea, hypotension and required intubation.  Admitted to ICU by CCM.  Treated for septic shock secondary to streptococcal pneumonia and bacteremia, acute respiratory failure with hypoxia, acute septic encephalopathy complicating underlying dementia and elevated troponin secondary to demand ischemia.  PCCM, cardiology and nephrology consulted.  Extubated 10/19.  Care transferred to [facility] 10/20.  Patient transferred from ICU to progressive care unit 10/20.  Wet sounding lungs, chest x-ray 10/21 markedly worse compared to 10/17 with complete whiteout of   Assessment & Plan:  Principal Problem:   Encephalopathy Active Problems:   Sepsis ([place])   Acute respiratory failure with hypoxia (HCC)   Protein-calorie malnutrition, severe   Non-ST elevation (NSTEMI) myocardial infarction (HCC)   Persistent atrial fibrillation (HCC)   Acute systolic heart failure (HCC)  Suspected mucous plugging/large left >right pleural effusions: Since yesterday, per nursing, issues with frequent suctioning.  Tried Mucomyst neb last night.  This morning, wet sounding  breathing.  Discussed with RN regarding aggressive suctioning.  Stat chest x-ray 10/21 personally reviewed, complete whiteout of left lung, large left and small to moderate right pleural effusions, dense right basilar opacity and patchy right midlung opacities likely reflecting a combination of atelectasis and pneumonia.  Chest x-ray looks significantly worse than 10/17.  Consulted PCCM again for evaluation.  Chest PT, Mucomyst nebs.  AMS will not permit incentive spirometry.  Currently not in respiratory distress, may hold off on thoracentesis.  PCCM consulted again for assistance.  Another DD: Aspiration.  Septic shock due to streptococcal pneumonia and bacteremia, POA (principal diagnosis): Briefly on pressors on 10/17 but has been off of pressors since.  Shock resolved.  Blood cultures from 10/17: Streptococcus pneumonia, sensitive to ceftriaxone (not on meningitis).  RVP negative.  Sputum culture pending.  As discussed with pharmacy yesterday, discontinued vancomycin, continue IV ceftriaxone 2 g daily x7 days.  Acute respiratory failure with hypoxia: Due to streptococcal pneumonia and acute encephalopathy.  Extubated 01/13/2022.  Currently stable on [place] oxygen 2-3 L/min.  Acute septic encephalopathy: Due to pneumonia and bacteremia complicating underlying dementia.  Baseline mental status is not known.  No focal deficits.  EEG without seizure activity. Remains lethargic and somnolent.  Avoid sedative medications.  Delirium precautions.  Aspiration precautions.  As discussed with daughter yesterday, current mental status worse than his baseline.  See note below for baseline.  Elevated troponin due to suspected demand ischemia: Cardiology following.  Troponin peaked to 4963.  Cardiology suspects demand ischemia in the setting of septic shock and acute hypoxic respiratory failure, but likely has underlying obstructive CAD from prior cath in 2012.  TTE 10/17: LVEF 45-50%, global  hypokinesis and grade 3  diastolic dysfunction.  Completed IV heparin x48 hours.  Continue aspirin and statins via core track.  Due to advanced age and comorbidities including dementia, Cardiology planning conservative medical management unless he develops angina.  Start carvedilol when BP allows, currently soft.  Acute systolic CHF: LVEF 84-66% with global hypokinesis.  Cardiology suspecting sepsis induced cardiomyopathy but ischemia remains on differentials.Clinically does not appear volume overloaded.  Now that he has core track for feeding, could start carvedilol via tube feeds.  Persistent A-fib: V paced rhythm with underlying A-fib on telemetry.  Not on Eliquis any longer due to fall risk and prior subdural hematoma.  ESRD on HD MWF: Nephrology following and most recent HD on 10/20.  Hypertension: Per nephrology, 5 kg over dry weight but does not appear clinically volume overloaded.  IV fluids at [facility].  Anemia of CKD: Hemoglobin stable.  Thrombocytopenia: Appears chronic since August.  Currently stable in the 70-80 range.  Type II DM: No further hypoglycemic episodes.  Now on tube feeds.  COPD: No clinical exacerbation.  Dementia: Patient on donezepil 10 Mg at bedtime, resume when able to take p.o.  Acquired hypothyroidism: TSH 5.8, normal free T4.  T3 pending.  When able to take p.o., resume levothyroxine 150 mcg daily.  Electrolytes (hypokalemia, hypomagnesemia, hypophosphatemia): Management per nephrology.  Low magnesium and phosphate have corrected.  Body mass index is 18.59 kg/m.  Nutritional Status Nutrition Problem: Severe Malnutrition Etiology: chronic illness (ESRD on HD, COPD) Signs/Symptoms: severe fat depletion, moderate muscle depletion, severe muscle depletion Interventions: Tube feeding, MVI  Pressure Ulcer: Pressure Injury 09/02/18 Sacrum Mid Stage I -  Intact skin with non-blanchable redness of a localized area usually over a bony prominence. (Active)  09/02/18 0017  Location: Sacrum   Location Orientation: Mid  Staging: Stage I -  Intact skin with non-blanchable redness of a localized area usually over a bony prominence.  Wound Description (Comments):   Present on Admission: Yes  Dressing Type Foam - Lift dressing to assess site every shift 01/15/22 2258     DVT prophylaxis: heparin injection 5,000 Units Start: 01/15/22 2200 SCDs Start: 12/31/2021 1256     Code Status: Full Code:  Family Communication: Daughter via phone 10/20 Disposition:  Status is: Inpatient Remains inpatient appropriate because: Remains encephalopathic, not safe to take p.o., IV antibiotics, core track feeding   As per discussion with daughter via phone 10/20, patient lives alone but she stays with him 5 days a week and the other 2 days, he has caregivers to assist him.  He ambulates independently without a cane or a walker.  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  He has some cognitive impairment but able to carry on conversations, does have period's of confusion but no agitation.  She visited her on 10/19 and he seemed to be doing better than on admission.  Consultants:   PCCM Nephrology Cardiology  Procedures:   Extubated 10/18  Antimicrobials:   IV vancomycin 10/17 > IV cefepime x1 dose on 10/17 IV ceftriaxone 10/18 >   Subjective:  Seemed a little more alert than yesterday.  To call, opened eyes, whispered his name, stated that he was "okay".  Rest of speech unintelligible, mumbled and soft.  Objective:   Vitals:   01/15/22 2200 01/15/22 2258 01/16/22 0340 01/16/22 0500  BP: 107/60 102/66 (!) 113/58   Pulse: 62 70 68 69  Resp: (!) 25 (!) 21 19 (!) 22  Temp:  99.3 F (  37.4 C) 98.5 F (36.9 C)   TempSrc:  Axillary Axillary   SpO2: 100% 97% 90% 100%  Weight:    57.1 kg  Height:        General exam: Elderly male, moderately built and frail, chronically ill looking, lying comfortably propped up in bed without distress. Respiratory system: Wet sounding breathing.   Reduced breath sounds, left greater than sign right, harsh breath sounds.  Few bibasilar crackles.  No wheezing or rhonchi.  Mild tachypnea but no overt respiratory distress. Cardiovascular system: S1 & S2 heard, irregularly irregular. No JVD, murmurs, rubs, gallops or clicks. No pedal edema.  Telemetry personally reviewed: Underlying A-fib, rate controlled, AV paced rhythm.  Stable and unchanged. Gastrointestinal system: Abdomen is nondistended, soft and nontender. No organomegaly or masses felt. Normal bowel sounds heard. Central nervous system: Mental status as noted above. No focal neurological deficits. Extremities: He is to have symmetric normal power in upper extremities based on a spontaneous movements.  Did not see moving his lower extremities.  Left upper arm AV fistula. Skin: No rashes, lesions or ulcers Psychiatry: Judgement and insight impaired. Mood & affect cannot be assessed.     Data Reviewed:   I have personally reviewed following labs and imaging studies   CBC: Recent Labs  Lab 01/05/2022 1012 01/20/2022 1029 01/13/22 0604 01/14/22 0453 01/15/22 0445  WBC 7.0  --  7.9 7.6 6.2  NEUTROABS 5.6  --   --   --   --   HGB 9.6*   < > 9.2* 9.0* 9.1*  HCT 30.5*   < > 28.0* 27.9* 27.4*  MCV 103.7*  --  98.9 100.4* 100.0  PLT 94*  --  75* 82* 78*   < > = values in this interval not displayed.    Basic Metabolic Panel: Recent Labs  Lab 01/05/2022 1012 01/25/2022 1029 01/03/2022 1137 01/26/2022 1146 12/27/2021 1453 01/16/2022 1453 01/13/22 0604 01/13/22 1405 01/13/22 1654 01/14/22 0453 01/15/22 0445  NA 139 138   < > 139  --   --  138 137  --  138 138  K 3.3* 3.3*   < > 3.3*  --   --  3.4* 3.3*  --  3.3* 3.2*  CL 98 96*  --   --   --   --  97* 97*  --  96* 96*  CO2 29  --   --   --   --   --  24 23  --  26 24  GLUCOSE 67* 68*  --   --   --   --  66* 85  --  101* 111*  BUN 30* 29*  --   --   --   --  46* 51*  --  23 35*  CREATININE 4.66* 4.80*  --   --   --   --  5.59* 6.02*   --  3.31* 4.55*  CALCIUM 8.5*  --   --   --   --   --  8.9 8.9  --  9.0 9.3  MG  --   --   --   --  1.5*  --  1.7  --  1.5* 1.6* 2.2  PHOS  --   --   --   --  3.8   < > 3.9 4.3 1.7* 3.8 3.5  3.5   < > = values in this interval not displayed.    Liver Function Tests: Recent Labs  Lab 12/27/2021 1012 01/13/22 1405  01/15/22 0445  AST 59*  --   --   ALT 33  --   --   ALKPHOS 53  --   --   BILITOT 1.2  --   --   PROT 5.6*  --   --   ALBUMIN 2.8* 2.8* 2.5*    CBG: Recent Labs  Lab 01/15/22 2321 01/16/22 0339 01/16/22 0920  GLUCAP 151* 169* 175*    Microbiology Studies:   Recent Results (from the past 240 hour(s))  Blood Culture (routine x 2)     Status: Abnormal   Collection Time: 01/25/2022  9:58 AM   Specimen: BLOOD RIGHT ARM  Result Value Ref Range Status   Specimen Description BLOOD RIGHT ARM  Final   Special Requests   Final    BOTTLES DRAWN AEROBIC AND ANAEROBIC Blood Culture adequate volume   Culture  Setup Time   Final    GRAM POSITIVE COCCI IN BOTH AEROBIC AND ANAEROBIC BOTTLES CRITICAL RESULT CALLED TO, READ BACK BY AND VERIFIED WITH: V BRYK,PHARMD'@0335'$  01/13/22 [place] Performed at [facility], [place] [address], [place], [place] [address]    Culture STREPTOCOCCUS PNEUMONIAE (A)  Final   Report Status 01/15/2022 FINAL  Final   Organism ID, Bacteria STREPTOCOCCUS PNEUMONIAE  Final      Susceptibility   Streptococcus pneumoniae - MIC*    ERYTHROMYCIN <=0.12 SENSITIVE Sensitive     LEVOFLOXACIN 0.5 SENSITIVE Sensitive     VANCOMYCIN 0.5 SENSITIVE Sensitive     PENICILLIN (meningitis) 1 RESISTANT Resistant     PENO - penicillin 1      PENICILLIN (non-meningitis) 1 SENSITIVE Sensitive     PENICILLIN (oral) 1 INTERMEDIATE Intermediate     CEFTRIAXONE (non-meningitis) 1 SENSITIVE Sensitive     CEFTRIAXONE (meningitis) 1 INTERMEDIATE Intermediate     * STREPTOCOCCUS PNEUMONIAE  Blood Culture ID Panel (Reflexed)     Status: Abnormal   Collection Time: 01/11/2022   9:58 AM  Result Value Ref Range Status   Enterococcus faecalis NOT DETECTED NOT DETECTED Final   Enterococcus Faecium NOT DETECTED NOT DETECTED Final   Listeria monocytogenes NOT DETECTED NOT DETECTED Final   Staphylococcus species NOT DETECTED NOT DETECTED Final   Staphylococcus aureus (BCID) NOT DETECTED NOT DETECTED Final   Staphylococcus epidermidis NOT DETECTED NOT DETECTED Final   Staphylococcus lugdunensis NOT DETECTED NOT DETECTED Final   Streptococcus species DETECTED (A) NOT DETECTED Final    Comment: CRITICAL RESULT CALLED TO, READ BACK BY AND VERIFIED WITH: V BRYK,PHARMD'@0330'$  01/13/22 [place]    Streptococcus agalactiae NOT DETECTED NOT DETECTED Final   Streptococcus pneumoniae DETECTED (A) NOT DETECTED Final    Comment: CRITICAL RESULT CALLED TO, READ BACK BY AND VERIFIED WITH: V BRYK,PHARMD'@0330'$  01/13/22 [place]    Streptococcus pyogenes NOT DETECTED NOT DETECTED Final   A.calcoaceticus-baumannii NOT DETECTED NOT DETECTED Final   Bacteroides fragilis NOT DETECTED NOT DETECTED Final   Enterobacterales NOT DETECTED NOT DETECTED Final   Enterobacter cloacae complex NOT DETECTED NOT DETECTED Final   Escherichia coli NOT DETECTED NOT DETECTED Final   Klebsiella aerogenes NOT DETECTED NOT DETECTED Final   Klebsiella oxytoca NOT DETECTED NOT DETECTED Final   Klebsiella pneumoniae NOT DETECTED NOT DETECTED Final   Proteus species NOT DETECTED NOT DETECTED Final   Salmonella species NOT DETECTED NOT DETECTED Final   Serratia marcescens NOT DETECTED NOT DETECTED Final   Haemophilus influenzae NOT DETECTED NOT DETECTED Final   Neisseria meningitidis NOT DETECTED NOT DETECTED Final   Pseudomonas aeruginosa  NOT DETECTED NOT DETECTED Final   Stenotrophomonas maltophilia NOT DETECTED NOT DETECTED Final   Candida albicans NOT DETECTED NOT DETECTED Final   Candida auris NOT DETECTED NOT DETECTED Final   Candida glabrata NOT DETECTED NOT DETECTED Final   Candida krusei NOT DETECTED NOT  DETECTED Final   Candida parapsilosis NOT DETECTED NOT DETECTED Final   Candida tropicalis NOT DETECTED NOT DETECTED Final   Cryptococcus neoformans/gattii NOT DETECTED NOT DETECTED Final    Comment: Performed at [facility], [place] [address] [place], [place] [address]  Blood Culture (routine x 2)     Status: Abnormal   Collection Time: 12/31/2021 10:03 AM   Specimen: BLOOD RIGHT HAND  Result Value Ref Range Status   Specimen Description BLOOD RIGHT HAND  Final   Special Requests   Final    BOTTLES DRAWN AEROBIC ONLY Blood Culture results may not be optimal due to an inadequate volume of blood received in culture bottles   Culture  Setup Time   Final    GRAM POSITIVE COCCI IN PAIRS AEROBIC BOTTLE ONLY CRITICAL VALUE NOTED.  VALUE IS CONSISTENT WITH PREVIOUSLY REPORTED AND CALLED VALUE.    Culture (A)  Final    STREPTOCOCCUS PNEUMONIAE SUSCEPTIBILITIES PERFORMED ON PREVIOUS CULTURE WITHIN THE LAST 5 DAYS. Performed at [facility], [place] [address], [place], [place] [address]    Report Status 01/15/2022 FINAL  Final  Respiratory (~20 pathogens) panel by PCR     Status: None   Collection Time: 12/27/2021  1:03 PM   Specimen: Tracheal Aspirate; Respiratory  Result Value Ref Range Status   Adenovirus NOT DETECTED NOT DETECTED Final   Coronavirus 229E NOT DETECTED NOT DETECTED Final    Comment: (NOTE) The Coronavirus on the Respiratory Panel, DOES NOT test for the novel  Coronavirus (2019 nCoV)    Coronavirus HKU1 NOT DETECTED NOT DETECTED Final   Coronavirus NL63 NOT DETECTED NOT DETECTED Final   Coronavirus OC43 NOT DETECTED NOT DETECTED Final   Metapneumovirus NOT DETECTED NOT DETECTED Final   Rhinovirus / Enterovirus NOT DETECTED NOT DETECTED Final   Influenza A NOT DETECTED NOT DETECTED Final   Influenza B NOT DETECTED NOT DETECTED Final   Parainfluenza Virus 1 NOT DETECTED NOT DETECTED Final   Parainfluenza Virus 2 NOT DETECTED NOT DETECTED Final   Parainfluenza Virus  3 NOT DETECTED NOT DETECTED Final   Parainfluenza Virus 4 NOT DETECTED NOT DETECTED Final   Respiratory Syncytial Virus NOT DETECTED NOT DETECTED Final   Bordetella pertussis NOT DETECTED NOT DETECTED Final   Bordetella Parapertussis NOT DETECTED NOT DETECTED Final   Chlamydophila pneumoniae NOT DETECTED NOT DETECTED Final   Mycoplasma pneumoniae NOT DETECTED NOT DETECTED Final    Comment: Performed at [facility], [place]. [address] [place], [place] [address]  MRSA Next Gen by PCR, Nasal     Status: None   Collection Time: 01/05/2022  2:40 PM   Specimen: Nasal Mucosa; Nasal Swab  Result Value Ref Range Status   MRSA by PCR Next Gen NOT DETECTED NOT DETECTED Final    Comment: (NOTE) The GeneXpert MRSA Assay (FDA approved for NASAL specimens only), is one component of a comprehensive MRSA colonization surveillance program. It is not intended to diagnose MRSA infection nor to guide or monitor treatment for MRSA infections. Test performance is not FDA approved in patients less than 93 years old. Performed at [facility], [place] [address] [place], [place] [address]  Radiology Studies:  DG CHEST PORT 1 VIEW  Result Date: 01/16/2022 CLINICAL DATA:  Pneumonia EXAM: PORTABLE CHEST 1 VIEW COMPARISON:  Chest radiograph dated 01/11/2022, CT chest dated 01/14/2022 FINDINGS: Lines/tubes: Right chest wall pacemaker with leads projecting over the right atrium and ventricle. Enteric tube tip reaches the diaphragm and terminates below the field of view. Chest: Complete opacification of the left lung. Dense right basilar opacity and patchy right mid lung opacities. Pleura: Small to moderate right and large left pleural effusions. Heart/mediastinum: Cardiomediastinal silhouette is obscured. Aortic atherosclerosis. Bones: No acute osseous abnormality. IMPRESSION: 1. Large left and small to moderate right pleural effusions. 2. Complete opacification of the left lung. 3. Dense right basilar opacity  and patchy right mid lung opacities likely reflect a combination of atelectasis and pneumonia. Electronically Signed   By: Darrin Nipper M.D.   On: 01/16/2022 10:07   DG Abd Portable 1V  Result Date: 01/15/2022 CLINICAL DATA:  Enteric tube placement EXAM: PORTABLE ABDOMEN - 1 VIEW COMPARISON:  01/13/2022 FINDINGS: Esophageal tube tip overlies the mid to distal stomach. Pleural effusion and airspace disease at the bases appears worse compared to prior abdominal radiograph. IMPRESSION: Esophageal tube tip overlies the mid to distal stomach. Electronically Signed   By: Donavan Foil M.D.   On: 01/15/2022 16:18    Scheduled Meds:    aspirin  81 mg Per Tube Daily   atorvastatin  20 mg Per Tube Daily   calcitRIOL  0.5 mcg Oral Q M,W,F-HD   Chlorhexidine Gluconate Cloth  6 each Topical Daily   Chlorhexidine Gluconate Cloth  6 each Topical Q0600   Chlorhexidine Gluconate Cloth  6 each Topical Q0600   cinacalcet  90 mg Oral Q supper   darbepoetin (ARANESP) injection - DIALYSIS  100 mcg Intravenous Q Fri-HD   donepezil  10 mg Per Tube QHS   feeding supplement (PROSource TF20)  60 mL Per Tube Daily   heparin  5,000 Units Subcutaneous Q8H   levothyroxine  150 mcg Per Tube Q0600   multivitamin  1 tablet Per Tube QHS    Continuous Infusions:    cefTRIAXone (ROCEPHIN)  IV 2 g (01/16/22 0959)   dextrose 5% lactated ringers 10 mL/hr at 01/16/22 1000   feeding supplement (PEPTAMEN 1.5 CAL) 40 mL/hr at 01/16/22 0506     LOS: 4 days     Vernell Leep, MD,  FACP, [facility], [facility], [facility], [facility]   [facility] [place]     To contact the attending provider between 7A-7P or the covering provider during after hours 7P-7A, please log into the web site [email or web address] and access using universal [place] password for that web site. If you do not have the password, please call the hospital operator.  01/16/2022, 10:17 AM

## 2022-01-16 NOTE — Progress Notes (Addendum)
Rounding Note    Patient Name: Jeffrey Campbell Date of Encounter: 01/16/2022  [place] Cardiologist: Fransico Him, MD   Subjective   Oriented to person, follows commands   Inpatient Medications    Scheduled Meds:  aspirin  81 mg Per Tube Daily   atorvastatin  20 mg Per Tube Daily   brinzolamide  1 drop Both Eyes BID   And   brimonidine  1 drop Both Eyes BID   calcitRIOL  0.5 mcg Oral Q M,W,F-HD   Chlorhexidine Gluconate Cloth  6 each Topical Daily   Chlorhexidine Gluconate Cloth  6 each Topical Q0600   Chlorhexidine Gluconate Cloth  6 each Topical Q0600   cinacalcet  90 mg Oral Q supper   darbepoetin (ARANESP) injection - DIALYSIS  100 mcg Intravenous Q Fri-HD   donepezil  10 mg Per Tube QHS   feeding supplement (PROSource TF20)  60 mL Per Tube Daily   heparin  5,000 Units Subcutaneous Q8H   levothyroxine  150 mcg Per Tube Q0600   memantine  10 mg Per Tube BID   multivitamin  1 tablet Per Tube QHS   Continuous Infusions:  cefTRIAXone (ROCEPHIN)  IV 2 g (01/16/22 0959)   dextrose 5% lactated ringers 10 mL/hr at 01/16/22 1000   feeding supplement (PEPTAMEN 1.5 CAL) 40 mL/hr at 01/16/22 0506   PRN Meds: albuterol, docusate, mouth rinse, polyethylene glycol   Vital Signs    Vitals:   01/15/22 2200 01/15/22 2258 01/16/22 0340 01/16/22 0500  BP: 107/60 102/66 (!) 113/58   Pulse: 62 70 68 69  Resp: (!) 25 (!) 21 19 (!) 22  Temp:  99.3 F (37.4 C) 98.5 F (36.9 C)   TempSrc:  Axillary Axillary   SpO2: 100% 97% 90% 100%  Weight:    57.1 kg  Height:        Intake/Output Summary (Last 24 hours) at 01/16/2022 1038 Last data filed at 01/16/2022 0230 Gross per 24 hour  Intake 928.14 ml  Output 2500 ml  Net -1571.86 ml       01/16/2022    5:00 AM 01/15/2022    5:00 AM 01/14/2022    5:00 AM  Last 3 Weights  Weight (lbs) 125 lb 14.1 oz 131 lb 9.8 oz 125 lb 3.5 oz  Weight (kg) 57.1 kg 59.7 kg 56.8 kg      Telemetry    V-paced - Personally  Reviewed  ECG    No new ECG - Personally Reviewed  Physical Exam   GEN: Chronically ill appearing Neck: No JVD Cardiac: RRR, no murmurs Respiratory: Diminished breath sounds GI: Soft, nontender MS: No edema Neuro:  Oriented x1, follows some commands Psych: Unable to assess  Labs    High Sensitivity Troponin:   Recent Labs  Lab 01/01/2022 1317 01/16/2022 1453  TROPONINIHS 2,957* 4,963*      Chemistry Recent Labs  Lab 01/13/2022 1012 01/23/2022 1029 01/13/22 1405 01/13/22 1654 01/14/22 0453 01/15/22 0445  NA 139   < > 137  --  138 138  K 3.3*   < > 3.3*  --  3.3* 3.2*  CL 98   < > 97*  --  96* 96*  CO2 29   < > 23  --  26 24  GLUCOSE 67*   < > 85  --  101* 111*  BUN 30*   < > 51*  --  23 35*  CREATININE 4.66*   < > 6.02*  --  3.31* 4.55*  CALCIUM 8.5*   < > 8.9  --  9.0 9.3  MG  --    < >  --  1.5* 1.6* 2.2  PROT 5.6*  --   --   --   --   --   ALBUMIN 2.8*  --  2.8*  --   --  2.5*  AST 59*  --   --   --   --   --   ALT 33  --   --   --   --   --   ALKPHOS 53  --   --   --   --   --   BILITOT 1.2  --   --   --   --   --   GFRNONAA 12*   < > 9*  --  17* 12*  ANIONGAP 12   < > 17*  --  16* 18*   < > = values in this interval not displayed.     Lipids  Recent Labs  Lab 01/13/22 0604  TRIG 111     Hematology Recent Labs  Lab 01/13/22 0604 01/14/22 0453 01/15/22 0445  WBC 7.9 7.6 6.2  RBC 2.83* 2.78* 2.74*  HGB 9.2* 9.0* 9.1*  HCT 28.0* 27.9* 27.4*  MCV 98.9 100.4* 100.0  MCH 32.5 32.4 33.2  MCHC 32.9 32.3 33.2  RDW 15.0 14.9 14.9  PLT 75* 82* 78*    Thyroid  Recent Labs  Lab 01/20/2022 1012 01/13/22 1405  TSH 5.816*  --   FREET4  --  1.00     BNP Recent Labs  Lab 01/25/2022 1012  BNP 1,904.9*     DDimer No results for input(s): "DDIMER" in the last 168 hours.   Radiology    DG CHEST PORT 1 VIEW  Result Date: 01/16/2022 CLINICAL DATA:  Pneumonia EXAM: PORTABLE CHEST 1 VIEW COMPARISON:  Chest radiograph dated 12/30/2021, CT chest dated  01/21/2022 FINDINGS: Lines/tubes: Right chest wall pacemaker with leads projecting over the right atrium and ventricle. Enteric tube tip reaches the diaphragm and terminates below the field of view. Chest: Complete opacification of the left lung. Dense right basilar opacity and patchy right mid lung opacities. Pleura: Small to moderate right and large left pleural effusions. Heart/mediastinum: Cardiomediastinal silhouette is obscured. Aortic atherosclerosis. Bones: No acute osseous abnormality. IMPRESSION: 1. Large left and small to moderate right pleural effusions. 2. Complete opacification of the left lung. 3. Dense right basilar opacity and patchy right mid lung opacities likely reflect a combination of atelectasis and pneumonia. Electronically Signed   By: Darrin Nipper M.D.   On: 01/16/2022 10:07   DG Abd Portable 1V  Result Date: 01/15/2022 CLINICAL DATA:  Enteric tube placement EXAM: PORTABLE ABDOMEN - 1 VIEW COMPARISON:  01/13/2022 FINDINGS: Esophageal tube tip overlies the mid to distal stomach. Pleural effusion and airspace disease at the bases appears worse compared to prior abdominal radiograph. IMPRESSION: Esophageal tube tip overlies the mid to distal stomach. Electronically Signed   By: Donavan Foil M.D.   On: 01/15/2022 16:18    Cardiac Studies     Patient Profile     88 y.o. male with a history of CAD, ESRD, PAD, COPD, chronic diastolic heart failure, pulmonary hypertension, dementia, OSA, hypertension, persistent atrial fibrillation, complete heart block status post PPM who we are consulted by Dr. Tacy Learn for evaluation of troponin elevation  Assessment & Plan    Elevated troponin: troponin elevation to 4963.  Suspect demand ischemia  in setting of spetic shock and acute hypoxic respiratory failure, but likely in setting of underlying obstructive CAD (cath from 2012 showed 50 to 60% ostial LAD, 50 to 60% mid LAD, 60% proximal RCA stenosis).  Echo 10/17 showed EF 45-50%, global  hypokinesis, G3DD, moderate RV dysfunction, severe biatrial enlargement -Completed 48 hours on heparin drip -Continue ASA, statin  -Given age and comorbidities including dementia and considering only mild systolic dysfunction on echo, would plan medical management unless having issues with angina as he recovers from acute illness.  Acute systolic heart failure: EF 45-50% on echo with global hypokinesis, suspect due to sepsis-induced cardiomyopathy but ischemia on differential as above -Can add low dose coreg if BP remains stable off pressors  Septic shock: due to aspiration pneumonia.  Resolved, now off pressors.  Abx per primary team  Atrial fibrillation: in V-paced rhythm with underlying Afib.  Previously was on Eliquis but discontinued due to fall risk and prior subdural hematoma  ESRD: on HD   For questions or updates, please contact [place] Please consult [email or web address] for contact info under        Signed, Donato Heinz, MD  01/16/2022, 10:38 AM

## 2022-01-16 NOTE — Progress Notes (Signed)
CPT not done at this time. Patient is unstable.

## 2022-01-16 NOTE — Progress Notes (Signed)
NAME:  Jeffrey Campbell, MRN:  [id number], DOB:  [birth year], LOS: 4 ADMISSION DATE:  01/24/2022, CONSULTATION DATE:  01/04/2022 REFERRING MD:  EDP CHIEF COMPLAINT:  AMS, ARF   History of Present Illness:  88 year old male with extensive PMH including ESRD on HD MWF. Presents to ED on 10/17 after being found unresponsive at home. Per family patient recent admission 8/23 for altered mental status, UTI. Now with reported tiredness, weakness over last 24 hours. This morning when daughter arrived at patient home, she reports patient was unresponsive making gurgling sounds, on arrival to ED patient remains minimally responsive with tachypnea and hypotension requiring intubation. CXR with left lower lobe infiltrate. CT head negative. CT C/A/P with extensive bilateral infiltirates and noted left lower lobe infiltrate concerning for aspiration. Given Cefepime/Vancomycin. Critical Care Consulted for admission.  Pertinent  Medical History  HTN, DMII, CAD, CHB h/o PPM, ESRD on HD MWF, COPD,  Significant Hospital Events: Including procedures, antibiotic start and stop dates in addition to other pertinent events    Interim History / Subjective:  Patient care was transferred to hospitalist on 10/19, was transferred out of ICU on 610/20. This morning PCCM was reconsulted because of whiteout left side During my evaluation patient is completely unresponsive even with a sternal rub, no breath sounds heard on left side  Objective: Afebrile, MAP>65. Pulse in the 60s.   Blood pressure (!) 107/58, pulse 63, temperature 98.4 F (36.9 C), temperature source Oral, resp. rate (!) 22, height _0  (1.753 m), weight 57.1 kg, SpO2 100 %.      Intake/Output Summary (Last 24 hours) at 01/16/2022 1409 Last data filed at 01/16/2022 1200 Gross per 24 hour  Intake 1455.33 ml  Output --  Net 1455.33 ml   Filed Weights   01/14/22 0500 01/15/22 0500 01/16/22 0500  Weight: 56.8 kg 59.7 kg 57.1 kg   Physical exam: General:  Critically ill-appearing male, lying on the bed HEENT: [place]/AT, eyes anicteric.  moist mucus membranes Neuro: Unresponsive to sternal rub, pupils bilateral reactive to light Chest: Absent air entry on left side, rhonchi heard on right Heart: Paced rhythm, no murmurs or gallops Abdomen: Soft, nontender, nondistended, bowel sounds present Skin: No rash  Resolved Hospital Problem list    Assessment & Plan:  Septic Shock initially due to pneumococcal pneumonia now aspiration pneumonia, POA Acute hypoxic Respiratory Failure 2/2 to Aspiration Pneumonia and left-sided mucous plugging Acute septic encephalopathy with underlying dementia S/p PEA cardiac arrest due to hypoxia Patient was found unresponsive this morning He was not able to protect his airway with aspiration and mucous plugging leading to hypoxia Chest x-ray showed left-sided whiteout Decision was to transfer him to ICU and proceed with endotracheal intubation Patient was intubated, postintubation he became hypoxic and went into cardiac arrest CPR was performed per ACLS protocol, ROSC was achieved after 4 minutes, please see separate CPR note Emergent bronchoscopy was performed with mucous suctioning and bronchoalveolar lavage was performed and sent to the lab Broaden antibiotic to Unasyn Continue IV Levophed with map goal 65 Continue lung protective ventilation VAP prevention bundle in place PAD protocol with Precedex with RASS goal -1 Continue supportive care  ESRD on HD MWF Hypokalemia Anemia of Chronic Disease Nephrology is following Patient received hemodialysis yesterday Potassium supplementation is in place Monitor H&H  Elevated troponins likely due to demand cardiac ischemia Hx of CHB s/p pacemaker Acute systolic CHF Appreciate cardiology input Echocardiogram showed global hypokinesis with elevated Tropon 4,963 Patient received 48 hours  of IV heparin infusion He has pacemaker in place Continue aspirin Monitor  intake and output  Advanced dementia Closely monitor  Hypothyroidism Continue levothyroxine  DMII Continue SSI. Goal 140-180  Severe protein calorie malnutrition Resume tube feeds  Best Practice (right click and "Reselect all SmartList Selections" daily)  Diet/type: NPO tube feeds DVT prophylaxis: Subcu heparin GI prophylaxis: PPI Lines: N/A Foley: N/A Continuous: None  Code Status:  full code Last date of multidisciplinary goals of care discussion : 10/21.  Patient's daughter was updated at bedside   Total critical care time: 83 minutes  Performed by: [name]   Critical care time was exclusive of separately billable procedures and treating other patients.   Critical care was necessary to treat or prevent imminent or life-threatening deterioration.   Critical care was time spent personally by me on the following activities: development of treatment plan with patient and/or surrogate as well as nursing, discussions with consultants, evaluation of patient's response to treatment, examination of patient, obtaining history from patient or surrogate, ordering and performing treatments and interventions, ordering and review of laboratory studies, ordering and review of radiographic studies, pulse oximetry and re-evaluation of patient's condition.   [name], MD [facility] See Amion for pager If no response to pager, please call [phone] until 7pm After 7pm, Please call E-link [phone]

## 2022-01-16 NOTE — Progress Notes (Signed)
OT Cancellation Note  Patient Details Name: YANG RACK MRN: [id number] DOB: Oct 02, 1932   Cancelled Treatment:    Reason Eval/Treat Not Completed: Other (comment) (pt transferring from 4E to 79M. Will follow up for OT eval as schedule permits)  Lynnda Child, OTD, OTR/L Acute Rehab [phone]) [phone] - [place] 01/16/2022, 11:19 AM

## 2022-01-16 NOTE — Procedures (Signed)
Cardiopulmonary Resuscitation Note  Jeffrey Campbell  [id number]  02/11/1933  Date:01/16/22  Time:1:52 PM   Provider Performing:Denicia Pagliarulo   Procedure: Cardiopulmonary Resuscitation (17616)  Indication(s) Loss of Pulse  Consent N/A  Anesthesia N/A   Time Out N/A   Sterile Technique Hand hygiene, gloves   Procedure Description Called to patient's room for CODE BLUE. Initial rhythm was PEA/Asystole. Patient received high quality chest compressions for 4 minutes with defibrillation or cardioversion when appropriate. Epinephrine was administered every 3 minutes as directed by time Therapist, nutritional. Additional pharmacologic interventions included amiodarone, calcium chloride, and sodium bicarbonate. Additional procedural interventions include intra-osseus line and bedside echo.  Return of spontaneous circulation was achieved.  Family at bedside.   Complications/Tolerance N/A   EBL N/A   Specimen(s) N/A  Estimated time to ROSC: 70mnutes

## 2022-01-16 NOTE — Procedures (Signed)
Bedside Bronchoscopy Procedure Note NATHANEAL SOMMERS [id number] 1932/07/24  Procedure: Bronchoscopy Indications: Diagnostic evaluation of the airways, Obtain specimens for culture and/or other diagnostic studies, and Remove secretions  Procedure Details: ET Tube Size: ET Tube secured at lip (cm): Bite block in place: No In preparation for procedure, Patient hyper-oxygenated with 100 % FiO2 Airway entered and the following bronchi were examined: RUL, RML, RLL, LUL, LLL, and Bronchi.   Bronchoscope removed.    Evaluation BP (!) 113/58 (BP Location: Right Arm)   Pulse 69   Temp 98.5 F (36.9 C) (Axillary)   Resp (!) 22   Ht '5\' 9"'$  (1.753 m)   Wt 57.1 kg   SpO2 (!) 75%   BMI 18.59 kg/m  Breath Sounds:Rhonch and Absent O2 sats: transiently fell during during procedure Patient's Current Condition: unstable Specimens:  Sent purulent fluid Complications: No apparent complications Patient did tolerate procedure well.   Jesse Sans 01/16/2022, 11:48 AM

## 2022-01-16 NOTE — Progress Notes (Signed)
Patient received in bed, lung sound too wet , MD was at the bed side, CXR ordered, oral suction done by RT,1045 patient transferred to ICU for intubation, report given to [facility].

## 2022-01-16 NOTE — Progress Notes (Signed)
Patient's daughter updated on change in patient status.  Cardiac arrest, patient intubated. She confirms full code.  Daughter en route.        Noe Gens, MSN, APRN, NP-C, AGACNP-BC [facility] 01/16/2022, 11:20 AM   Please see [email or web address] for pager details.   From 7A-7P if no response, please call [phone] After hours, please call ELink [phone]

## 2022-01-16 NOTE — Progress Notes (Signed)
Subjective:  transferred to 4 E-  still very weak-  whispery voice-  says good AM and can tell me his name HD yest-  removed 2500  Objective Vital signs in last 24 hours: Vitals:   01/15/22 2200 01/15/22 2258 01/16/22 0340 01/16/22 0500  BP: 107/60 102/66 (!) 113/58   Pulse: 62 70 68 69  Resp: (!) 25 (!) 21 19 (!) 22  Temp:  99.3 F (37.4 C) 98.5 F (36.9 C)   TempSrc:  Axillary Axillary   SpO2: 100% 97% 90% 100%  Weight:    57.1 kg  Height:       Weight change: -2.6 kg  Intake/Output Summary (Last 24 hours) at 01/16/2022 0847 Last data filed at 01/16/2022 0230 Gross per 24 hour  Intake 1055.14 ml  Output 2500 ml  Net -1444.86 ml    Dialysis Orders:  MWF  - [facility]  4hrs, BFR 400, DFR AF1.5,  EDW 54.5kg, 2K/ 2Ca   Access: AVF  Heparin 3000 unit bolus Calcitriol 0.5 mcg PO qHD Sensipar '120mg'$  qHD     Assessment/Plan: AMS - likely 2/2 bacteremia and PNA. EEG with no seizure activity. Hx dementia.   Acute respiratory failure - intubated initially. Concern for aspiration on CT. Per CCM.  Able to be extubated 10/18 late-  still concern that he is not able to clear secretions Aspiration PNA - noted on CT. Intubated. On rocephin  Bacteremia - BC +streptococcus pneumoniae. On rocephin   ESRD -  On HD MWF.  Plan for HD next on Monday via AVF  Hypertension/volume  - initially hypotensive but BP improved with fluids.  3 kg over dry weight but does not appear that overloaded.   minimal to no UF with HD 10/18.  Getting d5 at 50 per hour for sugar in the 80's-  now sugar above 100, 169 this AM- will dec ivf to 10 per hour and try to wean off when can  Anemia of CKD - Hgb 9.2.  No recent ESA, will start-  100 weekly. No iron in setting of acute infection.  Secondary Hyperparathyroidism -  Ca and phos in goal. No meds now as is NPO-  to get coretrack will resume calcitriol and sensipar but does not appear to need binder at present   Nutrition - NPO CAD, CHB s/p PPM DMT2 COPD [place]-  3 k  bath-  also given some supp K 10/19  AM-  will check labs over weekend     [place]: Basic Metabolic Panel: Recent Labs  Lab 01/13/22 1405 01/13/22 1654 01/14/22 0453 01/15/22 0445  NA 137  --  138 138  K 3.3*  --  3.3* 3.2*  CL 97*  --  96* 96*  CO2 23  --  26 24  GLUCOSE 85  --  101* 111*  BUN 51*  --  23 35*  CREATININE 6.02*  --  3.31* 4.55*  CALCIUM 8.9  --  9.0 9.3  PHOS 4.3 1.7* 3.8 3.5  3.5   Liver Function Tests: Recent Labs  Lab 01/06/2022 1012 01/13/22 1405 01/15/22 0445  AST 59*  --   --   ALT 33  --   --   ALKPHOS 53  --   --   BILITOT 1.2  --   --   PROT 5.6*  --   --   ALBUMIN 2.8* 2.8* 2.5*   No results for input(s): "LIPASE", "AMYLASE" in the last 168 hours. No results  for input(s): "AMMONIA" in the last 168 hours. CBC: Recent Labs  Lab 01/07/2022 1012 01/10/2022 1029 01/13/22 0604 01/14/22 0453 01/15/22 0445  WBC 7.0  --  7.9 7.6 6.2  NEUTROABS 5.6  --   --   --   --   HGB 9.6*   < > 9.2* 9.0* 9.1*  HCT 30.5*   < > 28.0* 27.9* 27.4*  MCV 103.7*  --  98.9 100.4* 100.0  PLT 94*  --  75* 82* 78*   < > = values in this interval not displayed.   Cardiac Enzymes: No results for input(s): "CKTOTAL", "CKMB", "CKMBINDEX", "TROPONINI" in the last 168 hours. CBG: Recent Labs  Lab 01/15/22 1310 01/15/22 1649 01/15/22 1916 01/15/22 2321 01/16/22 0339  GLUCAP 98 113* 130* 151* 169*    Iron Studies: No results for input(s): "IRON", "TIBC", "TRANSFERRIN", "FERRITIN" in the last 72 hours. Studies/Results: DG Abd Portable 1V  Result Date: 01/15/2022 CLINICAL DATA:  Enteric tube placement EXAM: PORTABLE ABDOMEN - 1 VIEW COMPARISON:  01/13/2022 FINDINGS: Esophageal tube tip overlies the mid to distal stomach. Pleural effusion and airspace disease at the bases appears worse compared to prior abdominal radiograph. IMPRESSION: Esophageal tube tip overlies the mid to distal stomach. Electronically Signed   By: Donavan Foil M.D.    On: 01/15/2022 16:18   Medications: Infusions:  cefTRIAXone (ROCEPHIN)  IV Stopped (01/15/22 1124)   dextrose 5% lactated ringers 50 mL/hr at 01/16/22 0230   feeding supplement (PEPTAMEN 1.5 CAL) 40 mL/hr at 01/16/22 0506    Scheduled Medications:  aspirin  81 mg Per Tube Daily   atorvastatin  20 mg Per Tube Daily   calcitRIOL  0.5 mcg Oral Q M,W,F-HD   Chlorhexidine Gluconate Cloth  6 each Topical Daily   Chlorhexidine Gluconate Cloth  6 each Topical Q0600   Chlorhexidine Gluconate Cloth  6 each Topical Q0600   cinacalcet  90 mg Oral Q supper   darbepoetin (ARANESP) injection - DIALYSIS  100 mcg Intravenous Q Fri-HD   donepezil  10 mg Per Tube QHS   feeding supplement (PROSource TF20)  60 mL Per Tube Daily   heparin  5,000 Units Subcutaneous Q8H   levothyroxine  150 mcg Per Tube Q0600   multivitamin  1 tablet Per Tube QHS    have reviewed scheduled and prn medications.  Physical Exam: General:somnolent but arousable-  weak cough Heart: RRR Lungs: CBS bilat Abdomen: soft, non tender Extremities: min  peripheral edema  Dialysis Access: left AVF-  patent     01/16/2022,8:47 AM  LOS: 4 days

## 2022-01-16 NOTE — Procedures (Signed)
Intraosseous Needle Insertion Procedure Note    Date:01/16/22  Time:1:54 PM   Provider Performing:Jeremy Mclamb   Procedure: Insertion Intraosseous (12787)  Indication(s) Medication administration  Consent Unable to obtain consent due to emergent nature of procedure.  Anesthesia Topical only with 1% lidocaine   Timeout Verified patient identification, verified procedure, site/side was marked, verified correct patient position, special equipment/implants available, medications/allergies/relevant history reviewed, required imaging and test results available.  Procedure Description Area of needle insertion was cleaned with chlorhexidine. Intraosseous needle was placed into the right tibia. Bone marrow was aspirated and site easily flushed. The needle was secured in place and dressing applied.  Complications/Tolerance None; patient tolerated the procedure well.  EBL Minimal

## 2022-01-16 NOTE — Procedures (Signed)
Bronchoscopy Procedure Note  WASEEM SUESS  [id number]  1932-08-07  Date:01/16/22  Time:1:55 PM   Provider Performing:Bandy Honaker   Procedure(s):  Flexible bronchoscopy with bronchial alveolar lavage (74163) and Initial Therapeutic Aspiration of Tracheobronchial Tree (84536)  Indication(s) Mucous plugging  Consent Unable to obtain consent due to emergent nature of procedure.  Anesthesia Etomidate and rocuronium   Time Out Verified patient identification, verified procedure, site/side was marked, verified correct patient position, special equipment/implants available, medications/allergies/relevant history reviewed, required imaging and test results available.   Sterile Technique Usual hand hygiene, masks, gowns, and gloves were used   Procedure Description Bronchoscope advanced through endotracheal tube and into airway.  Airways were examined down to subsegmental level with findings noted below.   Following diagnostic evaluation, BAL(s) performed in right lower lobe with normal saline and return of mucus like fluid and Therapeutic aspiration performed in throughout the airway tree mostly on left side  Findings: Copious amount of thick tenacious secretions noted throughout the airway tree, very difficult to suction, requiring frequent in and out of bronchoscope and clearing of the airway   Complications/Tolerance None; patient tolerated the procedure well. Chest X-ray is needed post procedure.   EBL Minimal   Specimen(s) BAL

## 2022-01-17 ENCOUNTER — Inpatient Hospital Stay (HOSPITAL_COMMUNITY): Payer: Medicare Other

## 2022-01-17 DIAGNOSIS — J9601 Acute respiratory failure with hypoxia: Secondary | ICD-10-CM | POA: Diagnosis not present

## 2022-01-17 DIAGNOSIS — N186 End stage renal disease: Secondary | ICD-10-CM

## 2022-01-17 DIAGNOSIS — R579 Shock, unspecified: Secondary | ICD-10-CM | POA: Diagnosis not present

## 2022-01-17 DIAGNOSIS — I5021 Acute systolic (congestive) heart failure: Secondary | ICD-10-CM | POA: Diagnosis not present

## 2022-01-17 DIAGNOSIS — I469 Cardiac arrest, cause unspecified: Secondary | ICD-10-CM | POA: Diagnosis not present

## 2022-01-17 DIAGNOSIS — A419 Sepsis, unspecified organism: Secondary | ICD-10-CM | POA: Diagnosis not present

## 2022-01-17 DIAGNOSIS — Z992 Dependence on renal dialysis: Secondary | ICD-10-CM

## 2022-01-17 DIAGNOSIS — I214 Non-ST elevation (NSTEMI) myocardial infarction: Secondary | ICD-10-CM | POA: Diagnosis not present

## 2022-01-17 LAB — GLUCOSE, CAPILLARY
Glucose-Capillary: 119 mg/dL — ABNORMAL HIGH (ref 70–99)
Glucose-Capillary: 235 mg/dL — ABNORMAL HIGH (ref 70–99)
Glucose-Capillary: 265 mg/dL — ABNORMAL HIGH (ref 70–99)
Glucose-Capillary: 214 mg/dL — ABNORMAL HIGH (ref 70–99)
Glucose-Capillary: 183 mg/dL — ABNORMAL HIGH (ref 70–99)
Glucose-Capillary: 196 mg/dL — ABNORMAL HIGH (ref 70–99)

## 2022-01-17 LAB — RENAL FUNCTION PANEL
Albumin: 2.2 g/dL — ABNORMAL LOW (ref 3.5–5.0)
Anion gap: 14 (ref 5–15)
BUN: 38 mg/dL — ABNORMAL HIGH (ref 8–23)
CO2: 26 mmol/L (ref 22–32)
Calcium: 9.7 mg/dL (ref 8.9–10.3)
Chloride: 98 mmol/L (ref 98–111)
Creatinine, Ser: 4.58 mg/dL — ABNORMAL HIGH (ref 0.61–1.24)
GFR, Estimated: 12 mL/min — ABNORMAL LOW (ref >60)
Glucose, Bld: 198 mg/dL — ABNORMAL HIGH (ref 70–99)
Phosphorus: 2.5 mg/dL (ref 2.5–4.6)
Potassium: 3.8 mmol/L (ref 3.5–5.1)
Sodium: 138 mmol/L (ref 135–145)

## 2022-01-17 MED ORDER — FENTANYL CITRATE PF 50 MCG/ML IJ SOSY: 25.0000 ug | PREFILLED_SYRINGE | INTRAMUSCULAR | Status: DC | PRN

## 2022-01-17 MED ORDER — SODIUM CHLORIDE 0.9 % IV SOLN
2.0000 g | INTRAVENOUS | Status: DC
  Administered 2022-01-17 – 2022-01-18 (×2): 2 g via INTRAVENOUS
  Filled 2022-01-17 (×2): qty 20

## 2022-01-17 MED ORDER — INSULIN ASPART 100 UNIT/ML IJ SOLN
0.0000 [IU] | INTRAMUSCULAR | Status: DC
  Administered 2022-01-17: 5 [IU] via SUBCUTANEOUS
  Administered 2022-01-17 (×2): 3 [IU] via SUBCUTANEOUS
  Administered 2022-01-17: 8 [IU] via SUBCUTANEOUS
  Administered 2022-01-18 – 2022-01-20 (×11): 3 [IU] via SUBCUTANEOUS
  Administered 2022-01-20 (×2): 5 [IU] via SUBCUTANEOUS
  Administered 2022-01-20: 3 [IU] via SUBCUTANEOUS
  Administered 2022-01-20: 2 [IU] via SUBCUTANEOUS
  Administered 2022-01-21 (×2): 3 [IU] via SUBCUTANEOUS
  Administered 2022-01-21: 2 [IU] via SUBCUTANEOUS
  Administered 2022-01-21 – 2022-01-22 (×4): 3 [IU] via SUBCUTANEOUS
  Administered 2022-01-22: 5 [IU] via SUBCUTANEOUS
  Administered 2022-01-22 (×3): 3 [IU] via SUBCUTANEOUS
  Administered 2022-01-23 (×2): 5 [IU] via SUBCUTANEOUS
  Administered 2022-01-23 (×3): 3 [IU] via SUBCUTANEOUS
  Administered 2022-01-24: 2 [IU] via SUBCUTANEOUS
  Administered 2022-01-24 (×4): 3 [IU] via SUBCUTANEOUS
  Administered 2022-01-25: 2 [IU] via SUBCUTANEOUS
  Administered 2022-01-25: 3 [IU] via SUBCUTANEOUS
  Administered 2022-01-25: 2 [IU] via SUBCUTANEOUS
  Administered 2022-01-26 (×4): 3 [IU] via SUBCUTANEOUS
  Administered 2022-01-26 – 2022-01-27 (×3): 2 [IU] via SUBCUTANEOUS
  Administered 2022-01-27 (×2): 3 [IU] via SUBCUTANEOUS
  Administered 2022-01-27 – 2022-01-28 (×3): 2 [IU] via SUBCUTANEOUS
  Administered 2022-01-28: 3 [IU] via SUBCUTANEOUS
  Administered 2022-01-28 (×2): 2 [IU] via SUBCUTANEOUS
  Administered 2022-01-28: 5 [IU] via SUBCUTANEOUS
  Administered 2022-01-29: 3 [IU] via SUBCUTANEOUS
  Administered 2022-01-30 (×3): 2 [IU] via SUBCUTANEOUS

## 2022-01-17 MED ORDER — ORAL CARE MOUTH RINSE
15.0000 mL | OROMUCOSAL | Status: DC
  Administered 2022-01-17 – 2022-01-22 (×59): 15 mL via OROMUCOSAL

## 2022-01-17 MED ORDER — MIDODRINE HCL 5 MG PO TABS
5.0000 mg | ORAL_TABLET | Freq: Three times a day (TID) | ORAL | Status: DC
  Administered 2022-01-17 – 2022-01-21 (×13): 5 mg
  Filled 2022-01-17 (×13): qty 1

## 2022-01-17 MED ORDER — ORAL CARE MOUTH RINSE: 15.0000 mL | OROMUCOSAL | Status: DC | PRN

## 2022-01-17 MED ORDER — SODIUM CHLORIDE 3 % IN NEBU
4.0000 mL | INHALATION_SOLUTION | Freq: Two times a day (BID) | RESPIRATORY_TRACT | Status: AC
  Administered 2022-01-17 – 2022-01-19 (×6): 4 mL via RESPIRATORY_TRACT
  Filled 2022-01-17 (×7): qty 4

## 2022-01-17 NOTE — Progress Notes (Signed)
OT Cancellation Note  Patient Details Name: LENNON BOUTWELL MRN: [id number] DOB: [birth year]   Cancelled Treatment:    Reason Eval/Treat Not Completed: Patient not medically ready. 10/21 complete whiteout of left lung, transferred to ICU and intubated-sedated; brief PEA arrest with ROSC in  4 minutes.  [place], OTR/L Acute Rehab Services [facility] [place] [phone] Office [phone]    Almon Register 01/17/2022, 4:01 PM

## 2022-01-17 NOTE — Progress Notes (Signed)
[facility] signoff note   The care of this patient has been transferred from the [facility] service to the following service:  Service: PCCM Attending: Dr. Tacy Learn. I have discussed with Dr. Elsworth Soho Date & time: 01/17/2022, 1:06 PM  Events of last 24+ hours appreciated. Transferred back to ICU 10/21, intubated and on vasopressors. Sustained a hypoxic cardiac arrest after intubation.  Please re consult the [facility] team for any further assistance.  Thank you   [employer]. MD FACP, [place], [facility], [place], [place]     To contact a [place] provider, please log into the web site [email or web address] and access using universal [place] password for that web site. If you do not have the password, please call the hospital operator. Page "[place]" listed on top of the page. Provide a number where you can be directly reached.

## 2022-01-17 NOTE — Progress Notes (Signed)
Rounding Note    Patient Name: Jeffrey Campbell Date of Encounter: 01/17/2022  [place] Cardiologist: Fransico Him, MD   Subjective   PEA arrest yesterday, now intubated and sedated.  On levophed at 2 mcg/min  Inpatient Medications    Scheduled Meds:  acetylcysteine  3 mL Nebulization TID   aspirin  81 mg Per Tube Daily   atorvastatin  20 mg Per Tube Daily   brinzolamide  1 drop Both Eyes BID   And   brimonidine  1 drop Both Eyes BID   [START ON 01/18/2022] calcitRIOL  0.5 mcg Per Tube Q M,W,F-HD   Chlorhexidine Gluconate Cloth  6 each Topical Daily   Chlorhexidine Gluconate Cloth  6 each Topical Q0600   Chlorhexidine Gluconate Cloth  6 each Topical Q0600   darbepoetin (ARANESP) injection - DIALYSIS  100 mcg Intravenous Q Fri-HD   donepezil  10 mg Per Tube QHS   feeding supplement (PROSource TF20)  60 mL Per Tube Daily   heparin  5,000 Units Subcutaneous Q8H   levothyroxine  150 mcg Per Tube Q0600   memantine  10 mg Per Tube BID   multivitamin  1 tablet Per Tube QHS   mouth rinse  15 mL Mouth Rinse Q2H   pantoprazole  40 mg Per Tube Daily   Continuous Infusions:  sodium chloride Stopped (01/17/22 0541)   ampicillin-sulbactam (UNASYN) IV Stopped (01/17/22 0139)   dexmedetomidine (PRECEDEX) IV infusion 0.4 mcg/kg/hr (01/17/22 0600)   feeding supplement (PEPTAMEN 1.5 CAL) 1,000 mL (01/17/22 0403)   norepinephrine (LEVOPHED) Adult infusion 2 mcg/min (01/17/22 0600)   PRN Meds: sodium chloride, albuterol, docusate, mouth rinse, polyethylene glycol   Vital Signs    Vitals:   01/17/22 0700 01/17/22 0715 01/17/22 0730 01/17/22 0745  BP: (!) 96/57 (!) 99/59 (!) 99/58 (!) 95/58  Pulse: 68 68 67 68  Resp: (!) 22 (!) 22 (!) 22 (!) 22  Temp:      TempSrc:      SpO2: 100% 100% 100% 100%  Weight:      Height:        Intake/Output Summary (Last 24 hours) at 01/17/2022 0851 Last data filed at 01/17/2022 0600 Gross per 24 hour  Intake 1858.13 ml  Output --   Net 1858.13 ml       01/17/2022    5:00 AM 01/16/2022    5:00 AM 01/15/2022    5:00 AM  Last 3 Weights  Weight (lbs) 129 lb 10.1 oz 125 lb 14.1 oz 131 lb 9.8 oz  Weight (kg) 58.8 kg 57.1 kg 59.7 kg      Telemetry    V-paced - Personally Reviewed  ECG    No new ECG - Personally Reviewed  Physical Exam   GEN: Intubated, sedated Neck: + JVD Cardiac: RRR, no murmurs Respiratory: Mechanical breath sounds GI: Soft, nontender MS: No edema Neuro:  Sedated, not opening eyes or following commands Psych: Unable to assess  Labs    High Sensitivity Troponin:   Recent Labs  Lab 01/20/2022 1317 01/03/2022 1453  TROPONINIHS 2,957* 4,963*      Chemistry Recent Labs  Lab 01/19/2022 1012 12/28/2021 1029 01/13/22 1405 01/13/22 1654 01/14/22 0453 01/15/22 0445 01/16/22 1309 01/17/22 0331  NA 139   < > 137  --  138 138 138 138  K 3.3*   < > 3.3*  --  3.3* 3.2* 2.8* 3.8  CL 98   < > 97*  --  96* 96*  --  98  CO2 29   < > 23  --  26 24  --  26  GLUCOSE 67*   < > 85  --  101* 111*  --  198*  BUN 30*   < > 51*  --  23 35*  --  38*  CREATININE 4.66*   < > 6.02*  --  3.31* 4.55*  --  4.58*  CALCIUM 8.5*   < > 8.9  --  9.0 9.3  --  9.7  MG  --    < >  --  1.5* 1.6* 2.2  --   --   PROT 5.6*  --   --   --   --   --   --   --   ALBUMIN 2.8*  --  2.8*  --   --  2.5*  --  2.2*  AST 59*  --   --   --   --   --   --   --   ALT 33  --   --   --   --   --   --   --   ALKPHOS 53  --   --   --   --   --   --   --   BILITOT 1.2  --   --   --   --   --   --   --   GFRNONAA 12*   < > 9*  --  17* 12*  --  12*  ANIONGAP 12   < > 17*  --  16* 18*  --  14   < > = values in this interval not displayed.     Lipids  Recent Labs  Lab 01/13/22 0604  TRIG 111     Hematology Recent Labs  Lab 01/13/22 0604 01/14/22 0453 01/15/22 0445 01/16/22 1309  WBC 7.9 7.6 6.2  --   RBC 2.83* 2.78* 2.74*  --   HGB 9.2* 9.0* 9.1* 9.9*  HCT 28.0* 27.9* 27.4* 29.0*  MCV 98.9 100.4* 100.0  --   MCH  32.5 32.4 33.2  --   MCHC 32.9 32.3 33.2  --   RDW 15.0 14.9 14.9  --   PLT 75* 82* 78*  --     Thyroid  Recent Labs  Lab 01/07/2022 1012 01/13/22 1405  TSH 5.816*  --   FREET4  --  1.00     BNP Recent Labs  Lab 01/17/2022 1012  BNP 1,904.9*     DDimer No results for input(s): "DDIMER" in the last 168 hours.   Radiology    DG CHEST PORT 1 VIEW  Result Date: 01/17/2022 CLINICAL DATA:  [id number].  Pleural effusion and encephalopathy. EXAM: PORTABLE CHEST 1 VIEW COMPARISON:  Portable chest yesterday at 12:04 p.m. FINDINGS: 4:44 a.m. ETT tip is 3.5 cm from carina. Feeding tube enters the stomach with the full intragastric course not seen. Left chest dual lead pacing system and wire insertions are unaltered. Overlying telemetry leads and electrical pacer pads are also again shown. The heart is enlarged. There is perihilar vascular congestion, mild interstitial consolidation in the mid and lower lungs consistent with edema. This is slightly improved today. Patchy haziness in mid to lower lung fields, greater on the right is again noted and slightly improved. This could be ground-glass edema or pneumonitis. Dense left lower lobe consolidation continues to be seen and small pleural effusions. Stable mediastinum with aortic atherosclerosis. Thoracic cage is intact. IMPRESSION:  Slight improvement in vascular engorgement, interstitial edema and lung opacities, with the exception of persistent dense left lower lobe consolidation. Small pleural effusions are similar. Electronically Signed   By: Telford Nab M.D.   On: 01/17/2022 07:31   DG Abd Portable 1V  Result Date: 01/16/2022 CLINICAL DATA:  Enteric tube placement. EXAM: PORTABLE ABDOMEN - 1 VIEW COMPARISON:  01/15/2022 FINDINGS: A small bore feeding tube is noted with tip overlying the distal stomach. No other enteric tubes are noted. Bowel gas pattern is unchanged. Lower lung opacities are not significantly changed. IMPRESSION: Small bore  feeding tube with tip overlying the distal stomach. Electronically Signed   By: Margarette Canada M.D.   On: 01/16/2022 15:27   DG CHEST PORT 1 VIEW  Result Date: 01/16/2022 CLINICAL DATA:  Intubation EXAM: PORTABLE CHEST 1 VIEW COMPARISON:  01/16/2022 at 0952 hours FINDINGS: Interval placement of endotracheal tube with distal tip terminating 3.0 cm above the carina. Enteric tube courses below the diaphragm with distal tip beyond the inferior margin of the film. Right-sided implanted cardiac device remains in place. Overlying cardiac leads and pacer pad. Stable cardiomegaly. Aortic atherosclerosis. Significantly improved aeration of the left lung compared to the previous study. Bilateral perihilar interstitial opacities, right greater than left. No pneumothorax. IMPRESSION: 1. Interval placement of endotracheal tube with distal tip terminating 3.0 cm above the carina. 2. Significantly improved aeration of the left lung compared to the previous study. Electronically Signed   By: Davina Poke D.O.   On: 01/16/2022 12:31   DG CHEST PORT 1 VIEW  Result Date: 01/16/2022 CLINICAL DATA:  Pneumonia EXAM: PORTABLE CHEST 1 VIEW COMPARISON:  Chest radiograph dated 01/08/2022, CT chest dated 12/31/2021 FINDINGS: Lines/tubes: Right chest wall pacemaker with leads projecting over the right atrium and ventricle. Enteric tube tip reaches the diaphragm and terminates below the field of view. Chest: Complete opacification of the left lung. Dense right basilar opacity and patchy right mid lung opacities. Pleura: Small to moderate right and large left pleural effusions. Heart/mediastinum: Cardiomediastinal silhouette is obscured. Aortic atherosclerosis. Bones: No acute osseous abnormality. IMPRESSION: 1. Large left and small to moderate right pleural effusions. 2. Complete opacification of the left lung. 3. Dense right basilar opacity and patchy right mid lung opacities likely reflect a combination of atelectasis and pneumonia.  Electronically Signed   By: Darrin Nipper M.D.   On: 01/16/2022 10:07   DG Abd Portable 1V  Result Date: 01/15/2022 CLINICAL DATA:  Enteric tube placement EXAM: PORTABLE ABDOMEN - 1 VIEW COMPARISON:  01/13/2022 FINDINGS: Esophageal tube tip overlies the mid to distal stomach. Pleural effusion and airspace disease at the bases appears worse compared to prior abdominal radiograph. IMPRESSION: Esophageal tube tip overlies the mid to distal stomach. Electronically Signed   By: Donavan Foil M.D.   On: 01/15/2022 16:18    Cardiac Studies     Patient Profile     88 y.o. male with a history of CAD, ESRD, PAD, COPD, chronic diastolic heart failure, pulmonary hypertension, dementia, OSA, hypertension, persistent atrial fibrillation, complete heart block status post PPM who we are consulted by Dr. Tacy Learn for evaluation of troponin elevation  Assessment & Plan    Elevated troponin: troponin elevation to 4963.  Suspect demand ischemia in setting of septic shock and acute hypoxic respiratory failure, but likely in setting of underlying obstructive CAD (cath from 2012 showed 50 to 60% ostial LAD, 50 to 60% mid LAD, 60% proximal RCA stenosis).  Echo 10/17 showed EF 45-50%, global  hypokinesis, G3DD, moderate RV dysfunction, severe biatrial enlargement -Completed 48 hours on heparin drip -Continue ASA, statin  -Given age and comorbidities including dementia and considering only mild systolic dysfunction on echo, would plan medical management  Acute systolic heart failure: EF 45-50% on echo with global hypokinesis, suspect due to sepsis-induced cardiomyopathy but ischemia on differential as above -Hold GDMT given pressor requirement  Septic shock: due to aspiration pneumonia.  Resolved, was off pressors but had PEA arrest 10/21 and now back on low dose levophed.  Wean as able.  Abx per primary team.  Atrial fibrillation: in V-paced rhythm with underlying Afib.  Previously was on Eliquis but discontinued due to  fall risk and prior subdural hematoma  ESRD: on HD  Goals of care: agree with ongoing goals of care discussion.   For questions or updates, please contact [place] Please consult [email or web address] for contact info under    CRITICAL CARE TIME: I have spent a total of 32 minutes with patient reviewing hospital notes, telemetry, EKGs, labs and examining the patient as well as establishing an assessment and plan. The patient is critically ill with multi-organ system failure and requires high complexity decision making for assessment and support, frequent evaluation and titration of therapies, application of advanced monitoring technologies and extensive interpretation of multiple databases.    Signed, Donato Heinz, MD  01/17/2022, 8:51 AM

## 2022-01-17 NOTE — Progress Notes (Addendum)
NAME:  Jeffrey Campbell, MRN:  [id number], DOB:  1932-05-12, LOS: 5 ADMISSION DATE:  01/25/2022, CONSULTATION DATE:  01/22/2022 REFERRING MD:  EDP CHIEF COMPLAINT:  AMS, ARF   History of Present Illness:  88 year old male with extensive PMH including ESRD on HD MWF. Presents to ED on 10/17 after being found unresponsive at home. Per family patient recent admission 8/23 for altered mental status, UTI. Now with reported tiredness, weakness over last 24 hours. This morning when daughter arrived at patient home, she reports patient was unresponsive making gurgling sounds, on arrival to ED patient remains minimally responsive with tachypnea and hypotension requiring intubation. CXR with left lower lobe infiltrate. CT head negative. CT C/A/P with extensive bilateral infiltirates and noted left lower lobe infiltrate concerning for aspiration. Given Cefepime/Vancomycin. Critical Care Consulted for admission.   Pertinent  Medical History  HTN, DMII, CAD, CHB h/o PPM, ESRD on HD MWF, COPD,    Significant Hospital Events: Including procedures, antibiotic start and stop dates in addition to other pertinent events   ETT 10/17-10/18 10/20 transferred out of ICU 10/21 PCCM reconsulted for complete whiteout of left lung, transferred to ICU and intubated, brief PEA arrest with ROSC in  4 minutes I/O placed   Interim History / Subjective:   Critically ill, intubated On 2 mics of Levophed Sedated on Precedex  Objective: Afebrile, MAP>65. Pulse in the 60s.   Blood pressure (!) 95/58, pulse 68, temperature 100 F (37.8 C), temperature source Oral, resp. rate (!) 22, height '5\' 9"'$  (1.753 m), weight 58.8 kg, SpO2 100 %.      Intake/Output Summary (Last 24 hours) at 01/17/2022 0936 Last data filed at 01/17/2022 0600 Gross per 24 hour  Intake 1858.13 ml  Output --  Net 1858.13 ml    Filed Weights   01/15/22 0500 01/16/22 0500 01/17/22 0500  Weight: 59.7 kg 57.1 kg 58.8 kg   Physical exam: General:  Critically ill-appearing elderly male, lying on the bed HEENT: [place]/AT, eyes anicteric.  moist mucus membranes Neuro: Unresponsive to sternal rub, localizes with left arm, does not follow commands, moves right arm slightly, pupils bilateral reactive to light Chest: Proved air entry on left, no accessory muscle use Heart: Paced rhythm, no murmurs or gallops Abdomen: Soft, nontender, nondistended, bowel sounds present Skin: No rash  Labs show normal electrolytes ABG mild respiratory alkalosis  Resolved Hospital Problem list    Assessment & Plan:  Septic Shock initially due to pneumococcal pneumonia now aspiration pneumonia, POA S/p PEA cardiac arrest postintubation due to hypoxia, ROSC in 4 minutes  -Needing minimal doses of Levophed, can taper off , adding low-dose midodrine -Resume ceftriaxone 2 g every 24   Acute hypoxic Respiratory Failure 2/2 to Aspiration Pneumonia and left-sided mucous plugging requiring Emergent intubation 10/21 -Status post bronchoscopy  -Lower PEEP and respiratory rate -Start spontaneous breathing trials once awake -For tracheobronchial toilet, add hypertonic saline nebs and chest PT  ESRD on HD MWF Anemia of Chronic Disease  -HD per renal, expect to be off pressors by tomorrow  Elevated troponins likely due to demand cardiac ischemia Hx of CHB s/p pacemaker Acute systolic CHF Atrial fibrillation -previously on Eliquis but discontinued due to fall risk and prior subdural Appreciate cardiology input Echocardiogram showed global hypokinesis with elevated Tropon [phone] Patient received 48 hours of IV heparin infusion Continue aspirin   Acute septic encephalopathy  Concern for postanoxic injury Advanced dementia  -Use Precedex plus intermittent fentanyl, goal RASS 0 Head CT to help  better prognosticate  Hypothyroidism Continue levothyroxine  DMII Continue SSI. Goal 140-180  Severe protein calorie malnutrition Resume tube feeds  Best  Practice (right click and "Reselect all SmartList Selections" daily)  Diet/type: NPO tube feeds DVT prophylaxis: Subcu heparin GI prophylaxis: PPI Lines: N/A Foley: N/A Continuous: None  Code Status:  full code Last date of multidisciplinary goals of care discussion : 10/21.  Patient's daughter was updated at bedside   Total critical care time: 38 minutes     Critical care time was exclusive of separately billable procedures and treating other patients.   Critical care was necessary to treat or prevent imminent or life-threatening deterioration.   Critical care was time spent personally by me on the following activities: development of treatment plan with patient and/or surrogate as well as nursing, discussions with consultants, evaluation of patient's response to treatment, examination of patient, obtaining history from patient or surrogate, ordering and performing treatments and interventions, ordering and review of laboratory studies, ordering and review of radiographic studies, pulse oximetry and re-evaluation of patient's condition.   Leanna Sato Elsworth Soho MD [facility] See Amion for pager If no response to pager, please call [phone] until 7pm After 7pm, Please call E-link [phone]

## 2022-01-17 NOTE — Progress Notes (Signed)
An USGPIV (ultrasound guided PIV) has been placed for short-term vasopressor infusion. A correctly placed ivWatch must be used when administering Vasopressors. Should this treatment be needed beyond 72 hours, central line access should be obtained.  It will be the responsibility of the bedside nurse to follow best practice to prevent extravasations.   ?

## 2022-01-17 NOTE — Progress Notes (Signed)
Subjective:  suffered PEA cardiac arrest yesterday -  ROSC n 4 minutes - so now back in ICU-  intubated -  also had a bronch per CCM-- now on norepi as well   Objective Vital signs in last 24 hours: Vitals:   01/17/22 0545 01/17/22 0600 01/17/22 0615 01/17/22 0630  BP: (!) 94/56 (!) 99/56 (!) 93/55 (!) 94/54  Pulse: 67 67 68 69  Resp: (!) 22 (!) 22 (!) 22 (!) 22  Temp:      TempSrc:      SpO2: 100% 100% 100% 100%  Weight:      Height:       Weight change: 1.7 kg  Intake/Output Summary (Last 24 hours) at 01/17/2022 0711 Last data filed at 01/17/2022 0600 Gross per 24 hour  Intake 1858.13 ml  Output --  Net 1858.13 ml    Dialysis Orders:  MWF  - [facility]  4hrs, BFR 400, DFR AF1.5,  EDW 54.5kg, 2K/ 2Ca   Access: AVF  Heparin 3000 unit bolus Calcitriol 0.5 mcg PO qHD Sensipar '120mg'$  qHD     Assessment/Plan: AMS - likely 2/2 bacteremia and PNA. EEG with no seizure activity. Hx dementia.   Acute respiratory failure - intubated initially. Concern for aspiration on CT. Per CCM.  Able to be extubated 10/18 late-  still concern that he is not able to clear secretions-  required re intubation for cardiac arrest yesterday  Aspiration PNA - noted on CT. Intubated. Now on unasyn-  on 70 % fio2 at present but sats 100 % Bacteremia - BC +streptococcus pneumoniae. On unasyn  ESRD -  On HD MWF.  Had last on 10/20-  no HD needs today-  and in his current state of hemodynamic instability he could not tolerate IHD- would need CRRT/line-  will need to assess daily for HD need -  hoping may stabilize some in terms of BP so can get IHD tomorrow   Hypertension/volume  - initially hypotensive but BP improved with fluids.  Is over dry weight but does not appear that overloaded.   minimal to no UF with HD 10/18 and 10/20.  Was Getting d5 at 50 per hour for sugar prolonged the last 48 hours  Anemia of CKD - Hgb 9's.  No recent ESA, started  100 weekly. No iron in setting of acute infection.  Secondary  Hyperparathyroidism -  Ca and phos in goal. No meds now as is NPO-    Nutrition - NPO CAD, CHB s/p PPM DMT2 COPD E[place]tes-  3 k bath-  also given some supp K 10/19  AM-  will check labs over weekend     [place]: Basic Metabolic Panel: Recent Labs  Lab 01/14/22 0453 01/15/22 0445 01/16/22 1309 01/17/22 0331  NA 138 138 138 138  K 3.3* 3.2* 2.8* 3.8  CL 96* 96*  --  98  CO2 26 24  --  26  GLUCOSE 101* 111*  --  198*  BUN 23 35*  --  38*  CREATININE 3.31* 4.55*  --  4.58*  CALCIUM 9.0 9.3  --  9.7  PHOS 3.8 3.5  3.5  --  2.5   Liver Function Tests: Recent Labs  Lab 01/01/2022 1012 01/13/22 1405 01/15/22 0445 01/17/22 0331  AST 59*  --   --   --   ALT 33  --   --   --   ALKPHOS 53  --   --   --  BILITOT 1.2  --   --   --   PROT 5.6*  --   --   --   ALBUMIN 2.8* 2.8* 2.5* 2.2*   No results for input(s): "LIPASE", "AMYLASE" in the last 168 hours. No results for input(s): "AMMONIA" in the last 168 hours. CBC: Recent Labs  Lab 01/17/2022 1012 01/19/2022 1029 01/13/22 0604 01/14/22 0453 01/15/22 0445 01/16/22 1309  WBC 7.0  --  7.9 7.6 6.2  --   NEUTROABS 5.6  --   --   --   --   --   HGB 9.6*   < > 9.2* 9.0* 9.1* 9.9*  HCT 30.5*   < > 28.0* 27.9* 27.4* 29.0*  MCV 103.7*  --  98.9 100.4* 100.0  --   PLT 94*  --  75* 82* 78*  --    < > = values in this interval not displayed.   Cardiac Enzymes: No results for input(s): "CKTOTAL", "CKMB", "CKMBINDEX", "TROPONINI" in the last 168 hours. CBG: Recent Labs  Lab 01/16/22 1249 01/16/22 1609 01/16/22 1932 01/16/22 2309 01/17/22 0333  GLUCAP 178* 147* 122* 134* 119*    Iron Studies: No results for input(s): "IRON", "TIBC", "TRANSFERRIN", "FERRITIN" in the last 72 hours. Studies/Results: DG Abd Portable 1V  Result Date: 01/16/2022 CLINICAL DATA:  Enteric tube placement. EXAM: PORTABLE ABDOMEN - 1 VIEW COMPARISON:  01/15/2022 FINDINGS: A small bore feeding tube is noted with tip overlying  the distal stomach. No other enteric tubes are noted. Bowel gas pattern is unchanged. Lower lung opacities are not significantly changed. IMPRESSION: Small bore feeding tube with tip overlying the distal stomach. Electronically Signed   By: Margarette Canada M.D.   On: 01/16/2022 15:27   DG CHEST PORT 1 VIEW  Result Date: 01/16/2022 CLINICAL DATA:  Intubation EXAM: PORTABLE CHEST 1 VIEW COMPARISON:  01/16/2022 at 0952 hours FINDINGS: Interval placement of endotracheal tube with distal tip terminating 3.0 cm above the carina. Enteric tube courses below the diaphragm with distal tip beyond the inferior margin of the film. Right-sided implanted cardiac device remains in place. Overlying cardiac leads and pacer pad. Stable cardiomegaly. Aortic atherosclerosis. Significantly improved aeration of the left lung compared to the previous study. Bilateral perihilar interstitial opacities, right greater than left. No pneumothorax. IMPRESSION: 1. Interval placement of endotracheal tube with distal tip terminating 3.0 cm above the carina. 2. Significantly improved aeration of the left lung compared to the previous study. Electronically Signed   By: Davina Poke D.O.   On: 01/16/2022 12:31   DG CHEST PORT 1 VIEW  Result Date: 01/16/2022 CLINICAL DATA:  Pneumonia EXAM: PORTABLE CHEST 1 VIEW COMPARISON:  Chest radiograph dated 01/21/2022, CT chest dated 01/07/2022 FINDINGS: Lines/tubes: Right chest wall pacemaker with leads projecting over the right atrium and ventricle. Enteric tube tip reaches the diaphragm and terminates below the field of view. Chest: Complete opacification of the left lung. Dense right basilar opacity and patchy right mid lung opacities. Pleura: Small to moderate right and large left pleural effusions. Heart/mediastinum: Cardiomediastinal silhouette is obscured. Aortic atherosclerosis. Bones: No acute osseous abnormality. IMPRESSION: 1. Large left and small to moderate right pleural effusions. 2.  Complete opacification of the left lung. 3. Dense right basilar opacity and patchy right mid lung opacities likely reflect a combination of atelectasis and pneumonia. Electronically Signed   By: Darrin Nipper M.D.   On: 01/16/2022 10:07   DG Abd Portable 1V  Result Date: 01/15/2022 CLINICAL DATA:  Enteric tube placement EXAM:  PORTABLE ABDOMEN - 1 VIEW COMPARISON:  01/13/2022 FINDINGS: Esophageal tube tip overlies the mid to distal stomach. Pleural effusion and airspace disease at the bases appears worse compared to prior abdominal radiograph. IMPRESSION: Esophageal tube tip overlies the mid to distal stomach. Electronically Signed   By: Donavan Foil M.D.   On: 01/15/2022 16:18   Medications: Infusions:  sodium chloride Stopped (01/17/22 0541)   ampicillin-sulbactam (UNASYN) IV Stopped (01/17/22 0139)   dexmedetomidine (PRECEDEX) IV infusion 0.4 mcg/kg/hr (01/17/22 0600)   feeding supplement (PEPTAMEN 1.5 CAL) 1,000 mL (01/17/22 0403)   norepinephrine (LEVOPHED) Adult infusion 2 mcg/min (01/17/22 0600)    Scheduled Medications:  acetylcysteine  3 mL Nebulization TID   aspirin  81 mg Per Tube Daily   atorvastatin  20 mg Per Tube Daily   brinzolamide  1 drop Both Eyes BID   And   brimonidine  1 drop Both Eyes BID   [START ON 01/18/2022] calcitRIOL  0.5 mcg Per Tube Q M,W,F-HD   Chlorhexidine Gluconate Cloth  6 each Topical Daily   Chlorhexidine Gluconate Cloth  6 each Topical Q0600   Chlorhexidine Gluconate Cloth  6 each Topical Q0600   darbepoetin (ARANESP) injection - DIALYSIS  100 mcg Intravenous Q Fri-HD   donepezil  10 mg Per Tube QHS   feeding supplement (PROSource TF20)  60 mL Per Tube Daily   heparin  5,000 Units Subcutaneous Q8H   levothyroxine  150 mcg Per Tube Q0600   memantine  10 mg Per Tube BID   multivitamin  1 tablet Per Tube QHS   mouth rinse  15 mL Mouth Rinse Q2H   pantoprazole  40 mg Per Tube Daily    have reviewed scheduled and prn medications.  Physical  Exam: General:sedated on vent  Heart: RRR Lungs: CBS bilat Abdomen: soft, non tender Extremities: min  peripheral edema  Dialysis Access: left AVF-  patent     01/17/2022,7:11 AM  LOS: 5 days

## 2022-01-17 NOTE — Plan of Care (Signed)
  Problem: Safety: Goal: Non-violent Restraint(s) Outcome: Progressing   Problem: Education: Goal: Ability to describe self-care measures that may prevent or decrease complications (Diabetes Survival Skills Education) will improve Outcome: Progressing   Problem: Coping: Goal: Ability to adjust to condition or change in health will improve Outcome: Progressing   Problem: Fluid Volume: Goal: Ability to maintain a balanced intake and output will improve Outcome: Progressing   Problem: Health Behavior/Discharge Planning: Goal: Ability to identify and utilize available resources and services will improve Outcome: Progressing Goal: Ability to manage health-related needs will improve Outcome: Progressing   Problem: Metabolic: Goal: Ability to maintain appropriate glucose levels will improve Outcome: Progressing   Problem: Nutritional: Goal: Maintenance of adequate nutrition will improve Outcome: Progressing Goal: Progress toward achieving an optimal weight will improve Outcome: Progressing   Problem: Skin Integrity: Goal: Risk for impaired skin integrity will decrease Outcome: Progressing   Problem: Tissue Perfusion: Goal: Adequacy of tissue perfusion will improve Outcome: Progressing   Problem: Education: Goal: Knowledge of General Education information will improve Description: Including pain rating scale, medication(s)/side effects and non-pharmacologic comfort measures Outcome: Progressing   Problem: Health Behavior/Discharge Planning: Goal: Ability to manage health-related needs will improve Outcome: Progressing   Problem: Clinical Measurements: Goal: Ability to maintain clinical measurements within normal limits will improve Outcome: Progressing Goal: Will remain free from infection Outcome: Progressing Goal: Diagnostic test results will improve Outcome: Progressing Goal: Respiratory complications will improve Outcome: Progressing Goal: Cardiovascular  complication will be avoided Outcome: Progressing   Problem: Activity: Goal: Risk for activity intolerance will decrease Outcome: Progressing   Problem: Nutrition: Goal: Adequate nutrition will be maintained Outcome: Progressing   Problem: Coping: Goal: Level of anxiety will decrease Outcome: Progressing   Problem: Elimination: Goal: Will not experience complications related to bowel motility Outcome: Progressing Goal: Will not experience complications related to urinary retention Outcome: Progressing   Problem: Pain Managment: Goal: General experience of comfort will improve Outcome: Progressing   Problem: Safety: Goal: Ability to remain free from injury will improve Outcome: Progressing   Problem: Skin Integrity: Goal: Risk for impaired skin integrity will decrease Outcome: Progressing

## 2022-01-18 DIAGNOSIS — I5021 Acute systolic (congestive) heart failure: Secondary | ICD-10-CM | POA: Diagnosis not present

## 2022-01-18 DIAGNOSIS — J9601 Acute respiratory failure with hypoxia: Secondary | ICD-10-CM | POA: Diagnosis not present

## 2022-01-18 DIAGNOSIS — I214 Non-ST elevation (NSTEMI) myocardial infarction: Secondary | ICD-10-CM | POA: Diagnosis not present

## 2022-01-18 DIAGNOSIS — I469 Cardiac arrest, cause unspecified: Secondary | ICD-10-CM | POA: Diagnosis not present

## 2022-01-18 DIAGNOSIS — G9340 Encephalopathy, unspecified: Secondary | ICD-10-CM | POA: Diagnosis not present

## 2022-01-18 DIAGNOSIS — A419 Sepsis, unspecified organism: Secondary | ICD-10-CM | POA: Diagnosis not present

## 2022-01-18 LAB — CBC WITH DIFFERENTIAL/PLATELET
Abs Immature Granulocytes: 0.13 10*3/uL — ABNORMAL HIGH (ref 0.00–0.07)
Basophils Absolute: 0 10*3/uL (ref 0.0–0.1)
Basophils Relative: 0 %
Eosinophils Absolute: 0.2 10*3/uL (ref 0.0–0.5)
Eosinophils Relative: 2 %
HCT: 27.1 % — ABNORMAL LOW (ref 39.0–52.0)
Hemoglobin: 8.8 g/dL — ABNORMAL LOW (ref 13.0–17.0)
Immature Granulocytes: 2 %
Lymphocytes Relative: 11 %
Lymphs Abs: 0.9 10*3/uL (ref 0.7–4.0)
MCH: 32.8 pg (ref 26.0–34.0)
MCHC: 32.5 g/dL (ref 30.0–36.0)
MCV: 101.1 fL — ABNORMAL HIGH (ref 80.0–100.0)
Monocytes Absolute: 0.6 10*3/uL (ref 0.1–1.0)
Monocytes Relative: 7 %
Neutro Abs: 6.7 10*3/uL (ref 1.7–7.7)
Neutrophils Relative %: 78 %
Platelets: 89 10*3/uL — ABNORMAL LOW (ref 150–400)
RBC: 2.68 MIL/uL — ABNORMAL LOW (ref 4.22–5.81)
RDW: 15.3 % (ref 11.5–15.5)
WBC: 8.6 10*3/uL (ref 4.0–10.5)
nRBC: 0 % (ref 0.0–0.2)

## 2022-01-18 LAB — RENAL FUNCTION PANEL
Albumin: 2.1 g/dL — ABNORMAL LOW (ref 3.5–5.0)
Anion gap: 12 (ref 5–15)
BUN: 58 mg/dL — ABNORMAL HIGH (ref 8–23)
CO2: 26 mmol/L (ref 22–32)
Calcium: 9.9 mg/dL (ref 8.9–10.3)
Chloride: 103 mmol/L (ref 98–111)
Creatinine, Ser: 5.73 mg/dL — ABNORMAL HIGH (ref 0.61–1.24)
GFR, Estimated: 9 mL/min — ABNORMAL LOW (ref >60)
Glucose, Bld: 190 mg/dL — ABNORMAL HIGH (ref 70–99)
Phosphorus: 1.6 mg/dL — ABNORMAL LOW (ref 2.5–4.6)
Potassium: 3.9 mmol/L (ref 3.5–5.1)
Sodium: 141 mmol/L (ref 135–145)

## 2022-01-18 LAB — GLUCOSE, CAPILLARY
Glucose-Capillary: 154 mg/dL — ABNORMAL HIGH (ref 70–99)
Glucose-Capillary: 163 mg/dL — ABNORMAL HIGH (ref 70–99)
Glucose-Capillary: 169 mg/dL — ABNORMAL HIGH (ref 70–99)
Glucose-Capillary: 179 mg/dL — ABNORMAL HIGH (ref 70–99)
Glucose-Capillary: 193 mg/dL — ABNORMAL HIGH (ref 70–99)
Glucose-Capillary: 196 mg/dL — ABNORMAL HIGH (ref 70–99)

## 2022-01-18 MED ORDER — CHLORHEXIDINE GLUCONATE CLOTH 2 % EX PADS
6.0000 | MEDICATED_PAD | Freq: Every day | CUTANEOUS | Status: DC
  Administered 2022-01-18 – 2022-01-22 (×5): 6 via TOPICAL

## 2022-01-18 MED ORDER — SODIUM PHOSPHATES 45 MMOLE/15ML IV SOLN
15.0000 mmol | Freq: Once | INTRAVENOUS | Status: DC
  Filled 2022-01-18: qty 5

## 2022-01-18 MED ORDER — STROKE: EARLY STAGES OF RECOVERY BOOK
Freq: Once | Status: DC
  Filled 2022-01-18: qty 1

## 2022-01-18 MED ORDER — BANATROL TF EN LIQD
60.0000 mL | Freq: Two times a day (BID) | ENTERAL | Status: DC
  Administered 2022-01-18 – 2022-01-30 (×23): 60 mL
  Filled 2022-01-18 (×26): qty 60

## 2022-01-18 MED ORDER — SODIUM PHOSPHATES 45 MMOLE/15ML IV SOLN
30.0000 mmol | Freq: Once | INTRAVENOUS | Status: AC
  Administered 2022-01-18: 30 mmol via INTRAVENOUS
  Filled 2022-01-18: qty 10

## 2022-01-18 MED ORDER — PENTAFLUOROPROP-TETRAFLUOROETH EX AERO
INHALATION_SPRAY | CUTANEOUS | Status: AC
  Filled 2022-01-18: qty 30

## 2022-01-18 NOTE — Progress Notes (Signed)
Nutrition Follow-up  DOCUMENTATION CODES:   Severe malnutrition in context of chronic illness  INTERVENTION:   Continue tube feeds via Cortrak: - Peptamen 1.5 @ 50 ml/hr (1200 ml/day) - PROSource TF20 60 ml daily  Tube feeding regimen provides 1880 kcal, 102 grams of protein, and 923 ml of H2O.   - Banatrol TF 60 ml BID per tube  NUTRITION DIAGNOSIS:   Severe Malnutrition related to chronic illness (ESRD on HD, COPD) as evidenced by severe fat depletion, moderate muscle depletion, severe muscle depletion.  Ongoing, being addressed via TF  GOAL:   Patient will meet greater than or equal to 90% of their needs  Met via TF  MONITOR:   Vent status, Labs, Weight trends, TF tolerance, Skin, I & O's  REASON FOR ASSESSMENT:   Ventilator, Consult Enteral/tube feeding initiation and management  ASSESSMENT:   88 year old male with PMHx of peptic ulcer disease, HTN, HLD, CAD, ESRD on HD MWF, COPD, dementia, hypothyroidism, DM who presented after being found unresponsive at home with septic shock and acute hypoxic respiratory failure secondary to aspiration PNA, acute metabolic encephalopathy. Intubated 01/15/2022.  10/18 - extubated 10/20 - Cortrak placed (tip gastric) 10/21 - PEA arrest, intubated  Discussed pt with RN and during ICU rounds. Plan for iHD today. Pt off all sedation but not following commands consistently. Concern for new stroke. Pt with type 7 stools via rectal tube. RD to add banatrol TF BID.  EDW: 54.5 kg Admit weight: 55.9 kg Current weight: 58.8 kg  Current TF: Peptamen 1.5 @ 50 ml/hr, PROSource TF20 60 ml daily  Patient is currently intubated on ventilator support MV: 11.9 L/min Temp (24hrs), Avg:98.5 F (36.9 C), Min:97.6 F (36.4 C), Max:99.3 F (37.4 C)  Medications reviewed and include: calcitriol, SSI q 4 hours, rena-vit, protonix, IV abx, IV sodium phosphate 30 mmol once  Labs reviewed: phosphorus 1.6, hemoglobin 8.8, platelets 89 CBG's:  169-265 x 24 hours  Stool: 300 ml + 3 unmeasured occurrences x 24 hours I/O's: +4.1 L since admit  Diet Order:   Diet Order     None       EDUCATION NEEDS:   Not appropriate for education at this time  Skin:  Skin Assessment: Skin Integrity Issues: Stage I: sacrum  Last BM:  01/18/22 rectal tube  Height:   Ht Readings from Last 1 Encounters:  01/16/22 '5\' 9"'  (1.753 m)    Weight:   Wt Readings from Last 1 Encounters:  01/18/22 58.8 kg    Ideal Body Weight:  72.7 kg  BMI:  Body mass index is 19.14 kg/m.  Estimated Nutritional Needs:   Kcal:  7425-9563  Protein:  90-100 grams  Fluid:  UOP + 1 L    Gustavus Bryant, MS, RD, LDN Inpatient Clinical Dietitian Please see AMiON for contact information.

## 2022-01-18 NOTE — Progress Notes (Signed)
   01/18/22 1650  Vitals  Temp 97.9 F (36.6 C)  Temp Source Oral  BP (!) 144/63  MAP (mmHg) 86  BP Location Right Arm  BP Method Automatic  Patient Position (if appropriate) Lying  Pulse Rate 70  Pulse Rate Source Monitor  ECG Heart Rate 70  Resp (!) 22  Oxygen Therapy  SpO2 97 %  O2 Device Ventilator  FiO2 (%) 40 %  Pulse Oximetry Type Continuous   Received patient in bed to unit.  Alert and oriented.  Informed consent signed and in chart.   Treatment initiated: 1256 Treatment completed: 1625  Patient tolerated well.  Transported back to the room  Alert, without acute distress.  Hand-off given to patient's nurse.   Access used: AVF Access issues: NA  Total UF removed: 1000 ml Medication(s) given: Rocephin 2G IVPB Post HD VS: see above Post HD weight: 58.5kg   [facility]

## 2022-01-18 NOTE — Progress Notes (Addendum)
Rounding Note    Patient Name: Jeffrey Campbell Date of Encounter: 01/18/2022  [place] Cardiologist: Fransico Him, MD   Subjective   PEA arrest  10/21, now intubated and sedated.  He has been weaned off Levophed  Inpatient Medications    Scheduled Meds:  acetylcysteine  3 mL Nebulization TID   aspirin  81 mg Per Tube Daily   atorvastatin  20 mg Per Tube Daily   brinzolamide  1 drop Both Eyes BID   And   brimonidine  1 drop Both Eyes BID   calcitRIOL  0.5 mcg Per Tube Q M,W,F-HD   Chlorhexidine Gluconate Cloth  6 each Topical Q0600   Chlorhexidine Gluconate Cloth  6 each Topical Q0600   donepezil  10 mg Per Tube QHS   feeding supplement (PROSource TF20)  60 mL Per Tube Daily   heparin  5,000 Units Subcutaneous Q8H   insulin aspart  0-15 Units Subcutaneous Q4H   levothyroxine  150 mcg Per Tube Q0600   memantine  10 mg Per Tube BID   midodrine  5 mg Per Tube Q8H   multivitamin  1 tablet Per Tube QHS   mouth rinse  15 mL Mouth Rinse Q2H   pantoprazole  40 mg Per Tube Daily   sodium chloride HYPERTONIC  4 mL Nebulization BID   Continuous Infusions:  sodium chloride Stopped (01/17/22 1952)   cefTRIAXone (ROCEPHIN)  IV Stopped (01/17/22 1348)   feeding supplement (PEPTAMEN 1.5 CAL) 1,000 mL (01/18/22 0337)   norepinephrine (LEVOPHED) Adult infusion Stopped (01/17/22 2237)   sodium phosphate 30 mmol in dextrose 5 % 250 mL infusion 30 mmol (01/18/22 1007)   PRN Meds: sodium chloride, albuterol, docusate, fentaNYL (SUBLIMAZE) injection, mouth rinse, polyethylene glycol   Vital Signs    Vitals:   01/18/22 0733 01/18/22 0736 01/18/22 0737 01/18/22 0800  BP:    (!) 110/55  Pulse:    70  Resp:    14  Temp: 97.6 F (36.4 C)     TempSrc: Oral     SpO2:  100% 100% 94%  Weight:      Height:        Intake/Output Summary (Last 24 hours) at 01/18/2022 1011 Last data filed at 01/18/2022 0600 Gross per 24 hour  Intake 476.17 ml  Output 300 ml  Net 176.17 ml        01/18/2022    3:51 AM 01/17/2022    5:00 AM 01/16/2022    5:00 AM  Last 3 Weights  Weight (lbs) 129 lb 10.1 oz 129 lb 10.1 oz 125 lb 14.1 oz  Weight (kg) 58.8 kg 58.8 kg 57.1 kg      Telemetry    V paced rhythm - Personally Reviewed  ECG    No new ECG - Personally Reviewed  Physical Exam   GEN: intubated and sedated HEENT: Normal NECK: No JVD; No carotid bruits LYMPHATICS: No lymphadenopathy CARDIAC:RRR, no murmurs, rubs, gallops RESPIRATORY:  Clear to auscultation without rales, wheezing or rhonchi  ABDOMEN: Soft, non-tender, non-distended MUSCULOSKELETAL:  No edema; No deformity  SKIN: Warm and dry NEUROLOGIC:  cannot assess PSYCHIATRIC:  cannot assess Labs    High Sensitivity Troponin:   Recent Labs  Lab 01/15/2022 1317 01/17/2022 1453  TROPONINIHS 2,957* 4,963*      Chemistry Recent Labs  Lab 01/17/2022 1012 01/09/2022 1029 01/13/22 1654 01/14/22 0453 01/15/22 0445 01/16/22 1309 01/17/22 0331 01/18/22 0714  NA 139   < >  --  138  138 138 138 141  K 3.3*   < >  --  3.3* 3.2* 2.8* 3.8 3.9  CL 98   < >  --  96* 96*  --  98 103  CO2 29   < >  --  26 24  --  26 26  GLUCOSE 67*   < >  --  101* 111*  --  198* 190*  BUN 30*   < >  --  23 35*  --  38* 58*  CREATININE 4.66*   < >  --  3.31* 4.55*  --  4.58* 5.73*  CALCIUM 8.5*   < >  --  9.0 9.3  --  9.7 9.9  MG  --    < > 1.5* 1.6* 2.2  --   --   --   PROT 5.6*  --   --   --   --   --   --   --   ALBUMIN 2.8*   < >  --   --  2.5*  --  2.2* 2.1*  AST 59*  --   --   --   --   --   --   --   ALT 33  --   --   --   --   --   --   --   ALKPHOS 53  --   --   --   --   --   --   --   BILITOT 1.2  --   --   --   --   --   --   --   GFRNONAA 12*   < >  --  17* 12*  --  12* 9*  ANIONGAP 12   < >  --  16* 18*  --  14 12   < > = values in this interval not displayed.     Lipids  Recent Labs  Lab 01/13/22 0604  TRIG 111     Hematology Recent Labs  Lab 01/14/22 0453 01/15/22 0445 01/16/22 1309  01/18/22 0714  WBC 7.6 6.2  --  8.6  RBC 2.78* 2.74*  --  2.68*  HGB 9.0* 9.1* 9.9* 8.8*  HCT 27.9* 27.4* 29.0* 27.1*  MCV 100.4* 100.0  --  101.1*  MCH 32.4 33.2  --  32.8  MCHC 32.3 33.2  --  32.5  RDW 14.9 14.9  --  15.3  PLT 82* 78*  --  89*    Thyroid  Recent Labs  Lab 01/03/2022 1012 01/13/22 1405  TSH 5.816*  --   FREET4  --  1.00     BNP Recent Labs  Lab 01/13/2022 1012  BNP 1,904.9*     DDimer No results for input(s): "DDIMER" in the last 168 hours.   Radiology    CT HEAD WO CONTRAST (5MM)  Result Date: 01/17/2022 CLINICAL DATA:  Mental status change with unknown cause. Cardiac arrest, assess for [facility] injury. EXAM: CT HEAD WITHOUT CONTRAST TECHNIQUE: Contiguous axial images were obtained from the base of the skull through the vertex without intravenous contrast. RADIATION DOSE REDUCTION: This exam was performed according to the departmental dose-optimization program which includes automated exposure control, adjustment of the mA and/or kV according to patient size and/or use of iterative reconstruction technique. COMPARISON:  Five days ago FINDINGS: Brain: A small acute infarct is seen in the inferior left cerebellum. No generalized anoxic injury seen. Advanced chronic small vessel ischemia with confluent low-density in  the cerebral white matter. Generalized atrophy. No hemorrhage, hydrocephalus, or mass. Vascular: No hyperdense vessel or unexpected calcification. Skull: Normal. Negative for fracture or focal lesion. Sinuses/Orbits: No acute finding.  Bilateral cataract resection IMPRESSION: 1. Small acute infarct in the inferior left cerebellum. 2. No indication of generalized anoxic injury. 3. Atrophy and extensive chronic small vessel ischemia. Electronically Signed   By: Jorje Guild M.D.   On: 01/17/2022 11:57   DG CHEST PORT 1 VIEW  Result Date: 01/17/2022 CLINICAL DATA:  142230.  Pleural effusion and encephalopathy. EXAM: PORTABLE CHEST 1 VIEW COMPARISON:   Portable chest yesterday at 12:04 p.m. FINDINGS: 4:44 a.m. ETT tip is 3.5 cm from carina. Feeding tube enters the stomach with the full intragastric course not seen. Left chest dual lead pacing system and wire insertions are unaltered. Overlying telemetry leads and electrical pacer pads are also again shown. The heart is enlarged. There is perihilar vascular congestion, mild interstitial consolidation in the mid and lower lungs consistent with edema. This is slightly improved today. Patchy haziness in mid to lower lung fields, greater on the right is again noted and slightly improved. This could be ground-glass edema or pneumonitis. Dense left lower lobe consolidation continues to be seen and small pleural effusions. Stable mediastinum with aortic atherosclerosis. Thoracic cage is intact. IMPRESSION: Slight improvement in vascular engorgement, interstitial edema and lung opacities, with the exception of persistent dense left lower lobe consolidation. Small pleural effusions are similar. Electronically Signed   By: Telford Nab M.D.   On: 01/17/2022 07:31   DG Abd Portable 1V  Result Date: 01/16/2022 CLINICAL DATA:  Enteric tube placement. EXAM: PORTABLE ABDOMEN - 1 VIEW COMPARISON:  01/15/2022 FINDINGS: A small bore feeding tube is noted with tip overlying the distal stomach. No other enteric tubes are noted. Bowel gas pattern is unchanged. Lower lung opacities are not significantly changed. IMPRESSION: Small bore feeding tube with tip overlying the distal stomach. Electronically Signed   By: Margarette Canada M.D.   On: 01/16/2022 15:27   DG CHEST PORT 1 VIEW  Result Date: 01/16/2022 CLINICAL DATA:  Intubation EXAM: PORTABLE CHEST 1 VIEW COMPARISON:  01/16/2022 at 0952 hours FINDINGS: Interval placement of endotracheal tube with distal tip terminating 3.0 cm above the carina. Enteric tube courses below the diaphragm with distal tip beyond the inferior margin of the film. Right-sided implanted cardiac device  remains in place. Overlying cardiac leads and pacer pad. Stable cardiomegaly. Aortic atherosclerosis. Significantly improved aeration of the left lung compared to the previous study. Bilateral perihilar interstitial opacities, right greater than left. No pneumothorax. IMPRESSION: 1. Interval placement of endotracheal tube with distal tip terminating 3.0 cm above the carina. 2. Significantly improved aeration of the left lung compared to the previous study. Electronically Signed   By: Davina Poke D.O.   On: 01/16/2022 12:31    Cardiac Studies  2D echo 12/30/2021 IMPRESSIONS    1. Left ventricular ejection fraction, by estimation, is 45 to 50%. Left  ventricular ejection fraction by 2D MOD biplane is 46.3 %. The left  ventricle has mildly decreased function. The left ventricle demonstrates  global hypokinesis. There is moderate  left ventricular hypertrophy. Left ventricular diastolic parameters are  consistent with Grade III diastolic dysfunction (restrictive). Elevated  left ventricular end-diastolic pressure.   2. Right ventricular systolic function is moderately reduced. The right  ventricular size is mildly enlarged. There is moderately elevated  pulmonary artery systolic pressure. The estimated right ventricular  systolic pressure is 21.3 mmHg.  3. Left atrial size was severely dilated.   4. Right atrial size was severely dilated.   5. The mitral valve is abnormal. Mild mitral valve regurgitation.   6. The tricuspid valve is abnormal. Tricuspid valve regurgitation is mild  to moderate.   7. The aortic valve is tricuspid. Aortic valve regurgitation is trivial.  Aortic valve sclerosis is present, with no evidence of aortic valve  stenosis.   8. Mildly dilated pulmonary artery.   9. The inferior vena cava is dilated in size with <50% respiratory  variability, suggesting right atrial pressure of 15 mmHg.   Comparison(s): Changes from prior study are noted. 01/05/2019: LVEF 55-60%.    Patient Profile     88 y.o. male with a history of CAD, ESRD, PAD, COPD, chronic diastolic heart failure, pulmonary hypertension, dementia, OSA, hypertension, persistent atrial fibrillation, complete heart block status post PPM who we are consulted by Dr. Tacy Learn for evaluation of troponin elevation  Assessment & Plan    Elevated troponin: troponin elevation to 4963.  Suspect demand ischemia in setting of septic shock and acute hypoxic respiratory failure, but likely in setting of underlying obstructive CAD (cath from 2012 showed 50 to 60% ostial LAD, 50 to 60% mid LAD, 60% proximal RCA stenosis).  Echo 10/17 showed EF 45-50%, global hypokinesis, G3DD, moderate RV dysfunction, severe biatrial enlargement -Continue ASA, statin  -Given age and comorbidities including dementia and considering only mild systolic dysfunction on echo, would plan medical management  Acute systolic heart failure: EF 45-50% on echo with global hypokinesis, suspect due to sepsis-induced cardiomyopathy but ischemia on differential as above -Hold GDMT due to soft BP  Septic shock: due to aspiration pneumonia.   -Resolved, was off pressors but had PEA arrest 10/21 and went back on low dose levophed.   -now weaned off Levo -Abx per primary team.  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   For questions or updates, please contact [facility] consult [email or web address] for contact info under    CRITICAL CARE TIME: I have spent a total of 30 minutes with patient reviewing hospital notes, telemetry, EKGs, labs and  examining the patient as well as establishing an assessment and plan. The patient is critically ill with multi-organ system failure and requires high complexity decision making for assessment and support, frequent evaluation and titration of therapies, application of advanced monitoring technologies and extensive interpretation of multiple databases.    Signed, TrFransico HimMD  01/18/2022, 10:11 AM

## 2022-01-18 NOTE — Progress Notes (Signed)
OT Cancellation Note  Patient Details Name: LINDBERG ZENON MRN: [id number] DOB: [birth year]   Cancelled Treatment:    Reason Eval/Treat Not Completed: Patient not medically ready Newly intubated, weaning pressors, lethargic and unable to follow commands. Have not been able to complete formal OT evaluation yet w/ multiple holds/cancels. Will sign off and await new OT orders when pt more alert and able to participate.  Layla Maw 01/18/2022, 11:40 AM

## 2022-01-18 NOTE — Consult Note (Addendum)
Neurology Consultation  Reason for Consult: Stroke on CT head  Referring Physician: Dr. Ander Slade  CC: encephalopathy and respiratory failure   History is obtained from:medical record   HPI: Jeffrey Campbell is a 88 y.o. male with past medical history of HTN, HLD, hypothyroidism, DM, A fib not on AC due to falls and prior SDH, systolic heart failure, COPD, OSA, PAD, CAD w ith h/o CHB ands/p PPM, ESRD MWF who presented to [facility] on 10/17 after being found unresponsive at home by family. He was found to be in septic shock due to Pneumococcus pneumonia. On arrival he required intubation, CT head at that time was negative for acute process. rEEG with no seizure. He was in the ICU intubated from 10/17-10/18, he was transferred out of the ICU on 10/20. On 10/21, CXR was obtained and revealed Complete opacification of the left lung, he was transferred back to the ICU and re intubated with a brief PEA arrest with ROSC in 4 minutes. Repeat CT head on 10/22 revealed a Small acute infarct in the inferior left cerebellum.   LKW: unclear IV thrombolysis given?: no, outside window  Premorbid modified Rankin scale (mRS):  2-Slight disability-UNABLE to perform all activities but does not need assistance    ROS:  Unable to obtain due to altered mental status and ET tube in place   Past Medical History:  Diagnosis Date   Anemia    low iron   BPH (benign prostatic hyperplasia)    CAD (coronary artery disease)    Carotid artery stenosis    1-39% bilateral carotid artery stenosis and < 50% stenosis in the right CCA by dopplers 06/2017   CKD (chronic kidney disease), stage III (HCC)    Stage 4   Diastolic dysfunction    Glaucoma    Graves disease    History of ETOH abuse    Hyperlipemia    Hypertension    Hyperthyroidism 08/26/10   radioactive iodine therapy    Memory loss    Mitral regurgitation echo 2015   mild   Multiple thyroid nodules    MVP (mitral valve prolapse) 11/2012   posterior MVP   OSA  (obstructive sleep apnea)    upper airway resistance syndrome with RDI 18/hr - not on CPAP due to insurance not covering   Pre-diabetes    PUD (peptic ulcer disease)    Pulmonary hypertension ([place]) echo 2015   Group 2 with pulmonary venous HTN and Group 3 with OSA   Upper airway resistance syndrome      Family History  Problem Relation Age of Onset   Kidney failure Father    Hypertension Father    Colon cancer Mother    Colon cancer Brother    Lung disease Brother    Colon cancer Maternal Uncle      Social History:   reports that he quit smoking about 37 years ago. His smoking use included cigarettes. He has a 80.00 pack-year smoking history. He has never used smokeless tobacco. He reports that he does not drink alcohol and does not use drugs.  Medications  Current Facility-Administered Medications:    0.9 %  sodium chloride infusion, , Intravenous, PRN, [name], MD, Stopped at 01/18/22 1007   acetylcysteine (MUCOMYST) 20 % nebulizer / oral solution 3 mL, 3 mL, Nebulization, TID, Chand, Sudham, MD, 3 mL at 01/18/22 1452   albuterol (PROVENTIL) (2.5 MG/3ML) 0.083% nebulizer solution 2.5 mg, 2.5 mg, Nebulization, Q4H PRN, Hongalgi, Anand D, MD, 2.5 mg at  01/17/22 1414   aspirin chewable tablet 81 mg, 81 mg, Per Tube, Daily, Hongalgi, Anand D, MD, 81 mg at 01/18/22 1003   atorvastatin (LIPITOR) tablet 20 mg, 20 mg, Per Tube, Daily, Omar Person, NP, 20 mg at 01/18/22 1002   brinzolamide (AZOPT) 1 % ophthalmic suspension 1 drop, 1 drop, Both Eyes, BID, 1 drop at 01/18/22 1001 **AND** brimonidine (ALPHAGAN) 0.2 % ophthalmic solution 1 drop, 1 drop, Both Eyes, BID, Hongalgi, Anand D, MD, 1 drop at 01/18/22 1003   calcitRIOL (ROCALTROL) 1 MCG/ML solution 0.5 mcg, 0.5 mcg, Per Tube, Q M,W,F-HD, Glennon Mac, Courtney P, RPH   cefTRIAXone (ROCEPHIN) 2 g in sodium chloride 0.9 % 100 mL IVPB, 2 g, Intravenous, Q24H, Kara Mead V, MD, Last Rate: 200 mL/hr at 01/18/22 1600, Infusion  Verify at 01/18/22 1600   Chlorhexidine Gluconate Cloth 2 % PADS 6 each, 6 each, Topical, Q0600, Corliss Parish, MD, 6 each at 01/17/22 2200   Chlorhexidine Gluconate Cloth 2 % PADS 6 each, 6 each, Topical, Q0600, Claudia Desanctis, MD   docusate (COLACE) 50 MG/5ML liquid 100 mg, 100 mg, Per Tube, BID PRN, Omar Person, NP   donepezil (ARICEPT) tablet 10 mg, 10 mg, Per Tube, QHS, Omar Person, NP, 10 mg at 01/17/22 2144   feeding supplement (PEPTAMEN 1.5 CAL) liquid 1,000 mL, 1,000 mL, Per Tube, Continuous, Hongalgi, Anand D, MD, Last Rate: 50 mL/hr at 01/18/22 1227, Restarted at 01/18/22 1227   feeding supplement (PROSource TF20) liquid 60 mL, 60 mL, Per Tube, Daily, Hongalgi, Anand D, MD, 60 mL at 01/18/22 1002   fentaNYL (SUBLIMAZE) injection 25-100 mcg, 25-100 mcg, Intravenous, Q2H PRN, Rigoberto Noel, MD   fiber supplement (BANATROL TF) liquid 60 mL, 60 mL, Per Tube, BID, Olalere, Adewale A, MD   heparin injection 5,000 Units, 5,000 Units, Subcutaneous, Q8H, Donato Heinz, MD, 5,000 Units at 01/18/22 0511   insulin aspart (novoLOG) injection 0-15 Units, 0-15 Units, Subcutaneous, Q4H, Rigoberto Noel, MD, 3 Units at 01/18/22 0816   levothyroxine (SYNTHROID) tablet 150 mcg, 150 mcg, Per Tube, Q0600, Omar Person, NP, 150 mcg at 01/18/22 0511   memantine (NAMENDA) tablet 10 mg, 10 mg, Per Tube, BID, Hongalgi, Anand D, MD, 10 mg at 01/18/22 1002   midodrine (PROAMATINE) tablet 5 mg, 5 mg, Per Tube, Q8H, Kara Mead V, MD, 5 mg at 01/18/22 1230   multivitamin (RENA-VIT) tablet 1 tablet, 1 tablet, Per Tube, QHS, Chand, Currie Paris, MD, 1 tablet at 01/17/22 2144   norepinephrine (LEVOPHED) '4mg'$  in 262m (0.016 mg/mL) premix infusion, 0-40 mcg/min, Intravenous, Titrated, Chand, Sudham, MD, Last Rate: 7.5 mL/hr at 01/18/22 1600, 2 mcg/min at 01/18/22 1600   Oral care mouth rinse, 15 mL, Mouth Rinse, Q2H, AKara MeadV, MD, 15 mL at 01/18/22 1345   Oral care mouth rinse, 15  mL, Mouth Rinse, PRN, ARigoberto Noel MD   pantoprazole (PROTONIX) 2 mg/mL oral suspension 40 mg, 40 mg, Per Tube, Daily, Chand, Sudham, MD, 40 mg at 01/18/22 1002   pentafluoroprop-tetrafluoroeth (GEBAUERS) aerosol, , , ,    polyethylene glycol (MIRALAX / GLYCOLAX) packet 17 g, 17 g, Per Tube, Daily PRN, EOmar Person NP   sodium chloride HYPERTONIC 3 % nebulizer solution 4 mL, 4 mL, Nebulization, BID, AKara MeadV, MD, 4 mL at 01/18/22 0733   Exam: Current vital signs: BP 133/67 (BP Location: Right Arm)   Pulse 69   Temp 98.6 F (37 C) (Axillary)   Resp (Marland Kitchen  23   Ht '5\' 9"'$  (1.753 m)   Wt 58.8 kg   SpO2 100%   BMI 19.14 kg/m  Vital signs in last 24 hours: Temp:  [97.6 F (36.4 C)-99.3 F (37.4 C)] 98.6 F (37 C) (10/23 1523) Pulse Rate:  [66-74] 69 (10/23 1615) Resp:  [14-25] 23 (10/23 1615) BP: (92-149)/(50-69) 133/67 (10/23 1615) SpO2:  [92 %-100 %] 100 % (10/23 1615) FiO2 (%):  [40 %] 40 % (10/23 1615) Weight:  [58.8 kg] 58.8 kg (10/23 1230)  GENERAL: critically ill, frail, elderly male Awake, alert in NAD HEENT: - Normocephalic and atraumatic, dry mm, ET tube, Feeding tube LUNGS - Clear to auscultation bilaterally with no wheezes CV - S1S2 RRR, no m/r/g, equal pulses bilaterally. ABDOMEN - Soft, nontender, nondistended with normoactive BS Ext: warm, well perfused, intact peripheral pulses, no edema  NEURO:  Mental Status: He has his eyes closed. Opens eyes to voice. He tracks Mining engineer. Intermittently follows commands.  Cranial Nerves: PERRL. EOMI, visual fields full, Unable to assess due to ET tube facial asymmetry, facial sensation intact, hearing intact, tongue/uvula/soft palate midline, normal sternocleidomastoid and trapezius muscle strength. No evidence of tongue atrophy or fibrillations Motor: generalized weakness, able to lift bilateral arms off bed, cannot hold up for 10 seconds, bilateral grip strength weak but equal, left leg can pick off bed and hold  for 5 seconds, right leg can barely get off bed, wiggles toes on commands. Sensation- grimaces to noxious stimuli Coordination: Unable to assess Gait- deferred   NIHSS components Score: Comment  1a Level of Conscious 0'[x]'$  1'[]'$  2'[]'$  3'[]'$      1b LOC Questions 0'[]'$  1'[]'$  2'[x]'$       1c LOC Commands 0'[x]'$  1'[]'$  2'[]'$       2 Best Gaze 0'[]'$  1'[]'$  2'[]'$       3 Visual 0'[x]'$  1'[]'$  2'[]'$  3'[]'$      4 Facial Palsy 0'[x]'$  1'[]'$  2'[]'$  3'[]'$      5a Motor Arm - left 0'[]'$  1'[]'$  2'[x]'$  3'[]'$  4'[]'$  UN'[]'$    5b Motor Arm - Right 0'[]'$  1'[]'$  2'[x]'$  3'[]'$  4'[]'$  UN'[]'$    6a Motor Leg - Left 0'[]'$  1'[]'$  2'[]'$  3'[x]'$  4'[]'$  UN'[]'$    6b Motor Leg - Right 0'[]'$  1'[]'$  2'[]'$  3'[x]'$  4'[]'$  UN'[]'$    7 Limb Ataxia 0'[x]'$  1'[]'$  2'[]'$  3'[]'$  UN'[]'$     8 Sensory 0'[x]'$  1'[]'$  2'[]'$  UN'[]'$      9 Best Language 0'[]'$  1'[]'$  2'[]'$  3'[x]'$      10 Dysarthria 0'[]'$  1'[]'$  2'[]'$  UN'[x]'$      11 Extinct. and Inattention 0'[x]'$  1'[]'$  2'[]'$       TOTAL: 15    Labs I have reviewed labs in epic and the results pertinent to this consultation are:  CBC    Component Value Date/Time   WBC 8.6 01/18/2022 0714   RBC 2.68 (L) 01/18/2022 0714   HGB 8.8 (L) 01/18/2022 0714   HGB 10.4 (L) 07/21/2016 1037   HCT 27.1 (L) 01/18/2022 0714   HCT 27.7 (L) 07/25/2019 1604   PLT 89 (L) 01/18/2022 0714   PLT 178 07/21/2016 1037   MCV 101.1 (H) 01/18/2022 0714   MCV 90 07/21/2016 1037   MCH 32.8 01/18/2022 0714   MCHC 32.5 01/18/2022 0714   RDW 15.3 01/18/2022 0714   RDW 14.7 07/21/2016 1037   LYMPHSABS 0.9 01/18/2022 0714   MONOABS 0.6 01/18/2022 0714   EOSABS 0.2 01/18/2022 0714   BASOSABS 0.0 01/18/2022 0714    CMP     Component Value Date/Time   NA 141 01/18/2022 0714  NA 144 01/10/2019 0900   K 3.9 01/18/2022 0714   CL 103 01/18/2022 0714   CO2 26 01/18/2022 0714   GLUCOSE 190 (H) 01/18/2022 0714   BUN 58 (H) 01/18/2022 0714   BUN 84 (HH) 01/10/2019 0900   CREATININE 5.73 (H) 01/18/2022 0714   CREATININE 3.10 (H) 01/13/2017 0920   CALCIUM 9.9 01/18/2022 0714   PROT 5.6 (L) 01/06/2022 1012   PROT 7.3 12/21/2016 0914   ALBUMIN 2.1 (L)  01/18/2022 0714   ALBUMIN 4.5 12/21/2016 0914   AST 59 (H) 01/22/2022 1012   ALT 33 01/01/2022 1012   ALKPHOS 53 01/06/2022 1012   BILITOT 1.2 01/13/2022 1012   BILITOT 0.9 12/21/2016 0914   GFRNONAA 9 (L) 01/18/2022 0714   GFRNONAA 18 (L) 01/13/2017 0920   GFRAA 8 (L) 08/13/2019 1810   GFRAA 21 (L) 01/13/2017 0920    Lipid Panel     Component Value Date/Time   CHOL 117 05/04/2019 1215   CHOL 117 06/27/2017 0925   TRIG 111 01/13/2022 0604   HDL 49 05/04/2019 1215   HDL 60 06/27/2017 0925   CHOLHDL 2.4 05/04/2019 1215   VLDL 12 05/04/2019 1215   LDLCALC 56 05/04/2019 1215   LDLCALC 48 06/27/2017 0925     Imaging I have reviewed the images obtained:  CT-head 1. Small acute infarct in the inferior left cerebellum. 2. No indication of generalized anoxic injury. 3. Atrophy and extensive chronic small vessel ischemia.  Echo 10/17: EF 45-50%. The left ventricle has mildly decreased function. The left ventricle demonstrates global hypokinesis. There is moderate left ventricular hypertrophy. Left ventricular diastolic parameters are consistent with Grade III diastolic dysfunction. Right ventricular systolic function is moderately reduced. The right  ventricular size is mildly enlarged. There is moderately elevated  pulmonary artery systolic pressure. Left and right atrial size was severely dilated.  Mild mitral valve regurgitation  Assessment:  Jeffrey Campbell is a 89 y.o. male with past medical history of HTN, HLD, hypothyroidism, DM, A fib not on AC, systolic heart failure, COPD, OSA, PAD, CAD w ith h/o CHB ands/p PPM, ESRD MWF who presented to [facility] on 10/17 after being found unresponsive at home by family.  On 10/21, needed re intubation and  experienced a brief PEA arrest with ROSC in 4 minutes. CT head revealed a small cerebellum stroke  Small acute ischemic infarct in the left cerebellum  Recommendations: - HgbA1c, fasting lipid panel - MRI of the brain without contrast along  with MR angio head without contrast.(has PPM; if compatible with MRI) - Frequent neuro checks - Carotid US  - Prophylactic therapy-Antiplatelet med: Aspirin - dose '81mg'$  - Risk factor modification - Telemetry monitoring - PT consult, OT consult, Speech consult - Stroke team to follow  Beulah Gandy DNP, ACNPC-AG   NEUROHOSPITALIST ADDENDUM Performed a face to face diagnostic evaluation.   I have reviewed the contents of history and physical exam as documented by PA/ARNP/Resident and agree with above documentation.  I have discussed and formulated the above plan as documented. Edits to the note have been made as needed.  Impression/Key exam findings/Plan: has L cerebellum stroke on post arrest CTH. Etiology of the stroke is likely cardioembolic either in the setting of cardiac arrest or known hx of Afibb but not on AC due to SDH and falls.  Will get stroke workup for further evaluation but may not be a good candidate for anticoagulation.Agree with doing Aspirin to atleast offer some protection against cardioembolic stroke.  Plan discussed with son who was present at the bedside.  Donnetta Simpers, MD [facility] [phone]   If 7pm to 7am, please call on call as listed on AMION.

## 2022-01-18 NOTE — Progress Notes (Signed)
NAME:  Jeffrey Campbell, MRN:  [id number], DOB:  [birth year], LOS: 6 ADMISSION DATE:  01/13/2022, CONSULTATION DATE:  01/14/2022 REFERRING MD:  EDP, CHIEF COMPLAINT:  AMS. ARF   History of Present Illness:  88 year old male with extensive PMH including ESRD on HD MWF. Presents to ED on 10/17 after being found unresponsive at home. Per family patient recent admission 8/23 for altered mental status, UTI. Now with reported tiredness, weakness over last 24 hours. This morning when daughter arrived at patient home, she reports patient was unresponsive making gurgling sounds, on arrival to ED patient remains minimally responsive with tachypnea and hypotension requiring intubation. CXR with left lower lobe infiltrate. CT head negative. CT C/A/P with extensive bilateral infiltirates and noted left lower lobe infiltrate concerning for aspiration. Given Cefepime/Vancomycin. Critical Care Consulted for admission.   Pertinent  Medical History  HTN, DMII, CAD, CHB h/o PPM, ESRD on HD MWF, COPD,   Significant Hospital Events: Including procedures, antibiotic start and stop dates in addition to other pertinent events   ETT 10/17-10/18 10/20 transferred out of ICU 10/21 PCCM reconsulted for complete whiteout of left lung, transferred to ICU and intubated, brief PEA arrest with ROSC in  4 minutes I/O placed 10/22 CT Head: Small acute infarct in the inferior left cerebellum  Interim History / Subjective:  Opens eyes spontaneously Does not follow commands  Objective   Blood pressure (!) 110/55, pulse 70, temperature 97.6 F (36.4 C), temperature source Oral, resp. rate 14, height _0  (1.753 m), weight 58.8 kg, SpO2 94 %.    Vent Mode: PRVC FiO2 (%):  [40 %-50 %] 40 % Set Rate:  [18 bmp] 18 bmp Vt Set:  [560 mL] 560 mL PEEP:  [5 cmH20-8 cmH20] 5 cmH20 Plateau Pressure:  [20 cmH20-21 cmH20] 21 cmH20   Intake/Output Summary (Last 24 hours) at 01/18/2022 0900 Last data filed at 01/18/2022 0600 Gross per 24  hour  Intake 547.99 ml  Output 300 ml  Net 247.99 ml   Filed Weights   01/16/22 0500 01/17/22 0500 01/18/22 0351  Weight: 57.1 kg 58.8 kg 58.8 kg    Examination: General: Acutely ill-appearing elderly man. NAD HENT: [place]/AT. MMM Lungs: ON Vent. Anterior chest CTAB. Cardiovascular: Paced-rhythm. No m/r/g.  Abdomen: Soft. NT/ND Extremities: Warm. Faint DP pulses Neuro: Somnolent but opens eyes spontaneously. Does not follow commands  WBC 8.6, hgb 8.8, plt 89 sCr 5.73, K+ 3.9 CBGs 160s-210s  Resolved Hospital Problem list     Assessment & Plan:  Septic Shock 2/2 Aspiration PNA S/p PEA cardiac arrest postintubation due to hypoxia, ROSC in 4 minutes Improved. Maintaining MAPS above 65 off levo. Remains intubated -Off Levophed -Continue midodrine -Continue Rocephin for 1 more day  Acute hypoxic Respiratory Failure 2/2 to Aspiration Pneumonia and left-sided mucous plugging  S/p intubation and bronchoscopy on 10/21. Remains on PRVC w/ FiO2 of 40. BAL showing few WBC but no organism. Will try SBT later today.  -SBT  -Pulm toilet, nebs and chest PT   ESRD on HD MWF Anemia of Chronic Disease Off pressors Nephro following HD today   Troponemia Hx of CHB s/p pacemaker Acute systolic CHF Atrial fibrillation TTE showed global hypokinesis with elevated Tropon 4,963. Demand ischemia in the setting of septic shock.  Cardiology following, appreciate recs Continue medical mgmt w/ ASA, statin Tele  Acute Left cerebellum infarct Acute septic encephalopathy  Postanoxic injury Advanced dementia Off precedex. Opens eyes spontaneously. Does not follow commands. CT Head on 10/22 showed  small acute infarct in the inferior left cerebellum.  ASA, statin PT/OT when more awake   Hypothyroidism Continue levothyroxine   DMII CBGs 180s-190s Continue SSI. Goal 140-180   Severe protein calorie malnutrition Tube feeds  Best Practice (right click and "Reselect all SmartList Selections"  daily)   Diet/type: tubefeeds and NPO DVT prophylaxis: prophylactic heparin  GI prophylaxis: PPI Lines: N/A Foley:  N/A Code Status:  full code Last date of multidisciplinary goals of care discussion [Pending]  Labs   CBC: Recent Labs  Lab 12/28/2021 1012 01/09/2022 1029 01/13/22 0604 01/14/22 0453 01/15/22 0445 01/16/22 1309 01/18/22 0714  WBC 7.0  --  7.9 7.6 6.2  --  8.6  NEUTROABS 5.6  --   --   --   --   --  6.7  HGB 9.6*   < > 9.2* 9.0* 9.1* 9.9* 8.8*  HCT 30.5*   < > 28.0* 27.9* 27.4* 29.0* 27.1*  MCV 103.7*  --  98.9 100.4* 100.0  --  101.1*  PLT 94*  --  75* 82* 78*  --  89*   < > = values in this interval not displayed.    Basic Metabolic Panel: Recent Labs  Lab 01/24/2022 1453 01/13/22 0604 01/13/22 1405 01/13/22 1654 01/14/22 0453 01/15/22 0445 01/16/22 1309 01/17/22 0331 01/18/22 0714  NA  --  138 137  --  138 138 138 138 141  K  --  3.4* 3.3*  --  3.3* 3.2* 2.8* 3.8 3.9  CL  --  97* 97*  --  96* 96*  --  98 103  CO2  --  24 23  --  26 24  --  26 26  GLUCOSE  --  66* 85  --  101* 111*  --  198* 190*  BUN  --  46* 51*  --  23 35*  --  38* 58*  CREATININE  --  5.59* 6.02*  --  3.31* 4.55*  --  4.58* 5.73*  CALCIUM  --  8.9 8.9  --  9.0 9.3  --  9.7 9.9  MG 1.5* 1.7  --  1.5* 1.6* 2.2  --   --   --   PHOS 3.8 3.9 4.3 1.7* 3.8 3.5  3.5  --  2.5 1.6*   GFR: Estimated Creatinine Clearance: 7.7 mL/min (A) (by C-G formula based on SCr of 5.73 mg/dL (H)). Recent Labs  Lab 01/04/2022 1012 01/03/2022 1317 01/13/22 0604 01/14/22 0453 01/15/22 0445 01/18/22 0714  WBC 7.0  --  7.9 7.6 6.2 8.6  LATICACIDVEN 3.4* 3.8*  --   --   --   --     Liver Function Tests: Recent Labs  Lab 01/15/2022 1012 01/13/22 1405 01/15/22 0445 01/17/22 0331 01/18/22 0714  AST 59*  --   --   --   --   ALT 33  --   --   --   --   ALKPHOS 53  --   --   --   --   BILITOT 1.2  --   --   --   --   PROT 5.6*  --   --   --   --   ALBUMIN 2.8* 2.8* 2.5* 2.2* 2.1*   No results for  input(s): "LIPASE", "AMYLASE" in the last 168 hours. No results for input(s): "AMMONIA" in the last 168 hours.  ABG    Component Value Date/Time   PHART 7.497 (H) 01/16/2022 1309   PCO2ART 35.4 01/16/2022 1309  PO2ART 73 (L) 01/16/2022 1309   HCO3 27.5 01/16/2022 1309   TCO2 29 01/16/2022 1309   ACIDBASEDEF 5.0 (H) 07/28/2016 0850   ACIDBASEDEF 5.0 (H) 07/28/2016 0850   O2SAT 96 01/16/2022 1309     Coagulation Profile: Recent Labs  Lab 01/23/2022 1012  INR 1.5*    Cardiac Enzymes: No results for input(s): "CKTOTAL", "CKMB", "CKMBINDEX", "TROPONINI" in the last 168 hours.  HbA1C: Hgb A1c MFr Bld  Date/Time Value Ref Range Status  01/20/2022 01:17 PM 5.2 4.8 - 5.6 % Final    Comment:    (NOTE) Pre diabetes:          5.7%-6.4%  Diabetes:              >6.4%  Glycemic control for   <7.0% adults with diabetes   09/19/2016 04:07 AM 5.5 4.8 - 5.6 % Final    Comment:    (NOTE)         Pre-diabetes: 5.7 - 6.4         Diabetes: >6.4         Glycemic control for adults with diabetes: <7.0     CBG: Recent Labs  Lab 01/17/22 1540 01/17/22 1919 01/17/22 2315 01/18/22 0314 01/18/22 0731  GLUCAP 214* 183* 196* 169* 193*    Review of Systems:   As in HPI  Past Medical History:  He,  has a past medical history of Anemia, BPH (benign prostatic hyperplasia), CAD (coronary artery disease), Carotid artery stenosis, CKD (chronic kidney disease), stage III ([place]), Diastolic dysfunction, Glaucoma, Graves disease, History of ETOH abuse, Hyperlipemia, Hypertension, Hyperthyroidism (08/26/10), Memory loss, Mitral regurgitation (echo 2015), Multiple thyroid nodules, MVP (mitral valve prolapse) (11/2012), OSA (obstructive sleep apnea), Pre-diabetes, PUD (peptic ulcer disease), Pulmonary hypertension ([place]) (echo 2015), and Upper airway resistance syndrome.   Surgical History:   Past Surgical History:  Procedure Laterality Date   APPENDECTOMY     AV FISTULA PLACEMENT Left 10/18/2017    Procedure: ARTERIOVENOUS (AV) FISTULA CREATION ARM;  Surgeon: Waynetta Sandy, MD;  Location: [place];  Service: Vascular;  Laterality: Left;   CARDIAC CATHETERIZATION     CARDIAC CATHETERIZATION N/A 02/17/2015   Procedure: Right Heart Cath;  Surgeon: Larey Dresser, MD;  Location: [place] [facility];  Service: Cardiovascular;  Laterality: N/A;   CARDIOVERSION N/A 02/08/2019   Procedure: CARDIOVERSION;  Surgeon: Donato Heinz, MD;  Location: [place];  Service: Endoscopy;  Laterality: N/A;   ESOPHAGOGASTRODUODENOSCOPY (EGD) WITH PROPOFOL Left 10/16/2016   Procedure: ESOPHAGOGASTRODUODENOSCOPY (EGD) WITH PROPOFOL;  Surgeon: Ronnette Juniper, MD;  Location: [facility];  Service: Gastroenterology;  Laterality: Left;   heart catherization     HERNIA REPAIR  10/2009   IR RADIOLOGIST EVAL & MGMT  09/28/2016   IR RADIOLOGIST EVAL & MGMT  10/19/2016   IR RADIOLOGIST EVAL & MGMT  01/18/2017   PACEMAKER IMPLANT N/A 02/09/2019   Procedure: PACEMAKER IMPLANT;  Surgeon: Constance Haw, MD;  Location: [place] [facility];  Service: Cardiovascular;  Laterality: N/A;   RIGHT HEART CATH N/A 07/28/2016   Procedure: Right Heart Cath;  Surgeon: Larey Dresser, MD;  Location: [place] [facility];  Service: Cardiovascular;  Laterality: N/A;     Social History:   reports that he quit smoking about 37 years ago. His smoking use included cigarettes. He has a 80.00 pack-year smoking history. He has never used smokeless tobacco. He reports that he does not drink alcohol and does not use drugs.   Family  History:  His family history includes Colon cancer in his brother, maternal uncle, and mother; Hypertension in his father; Kidney failure in his father; Lung disease in his brother.   Allergies Allergies  Allergen Reactions   Cardura [Doxazosin] Shortness Of Breath and Other (See Comments)    Dizziness    Hydrochlorothiazide Shortness Of Breath and Other (See Comments)    Dizziness     Iodinated Contrast Media Other (See Comments)    "shut down his kidneys" Patient has stage III chronic kidney disease   Zocor [Simvastatin] Other (See Comments)    Kidney problems    Protonix [Pantoprazole] Other (See Comments)    Worsening kidney problems   Rocaltrol [Calcitriol] Other (See Comments)    Unknown reaction   Budesonide-Formoterol Fumarate Other (See Comments)    Dry mouth   Minodyl [Minoxidil] Other (See Comments)    Unknown raction   Tiotropium Bromide Monohydrate Other (See Comments)    Dry mouth   Tricor [Fenofibrate]     Unknown reaction     Home Medications  Prior to Admission medications   Medication Sig Start Date End Date Taking? Authorizing Provider  atorvastatin (LIPITOR) 20 MG tablet TAKE 1 BY MOUTH DAILY ABSOLUTE LAST REFILL WITHOUT OFFICE VISIT PLEASE CALL [phone] TO SCHEDULE Patient taking differently: Take 20 mg by mouth daily. 11/09/21  Yes Larey Dresser, MD  Brinzolamide-Brimonidine [facility]) 1-0.2 % SUSP Place 1 drop into both eyes 2 (two) times daily.    Yes [provider]  calcium acetate (PHOSLO) 667 MG capsule Take 667 mg by mouth See admin instructions. 667 mg 3 times daily with meals, 667 mg 2 times daily with snacks. 11/30/19  Yes [provider]  donepezil (ARICEPT) 10 MG tablet Take 1 tablet (10 mg total) by mouth at bedtime. 08/17/21  Yes Suzzanne Cloud, NP  doxazosin (CARDURA) 4 MG tablet Take 1 tablet (4 mg total) by mouth daily. Patient taking differently: Take 4 mg by mouth 2 (two) times daily. 02/12/19  Yes Simmons, Brittainy M, PA-C  levothyroxine (SYNTHROID, LEVOTHROID) 150 MCG tablet Take 150 mcg by mouth daily before breakfast.   Yes [provider]  memantine (NAMENDA) 10 MG tablet TAKE 1 TABLET BY MOUTH TWICE A DAY Patient taking differently: Take 10 mg by mouth 2 (two) times daily. 01/19/21  Yes Suzzanne Cloud, NP  multivitamin (RENA-VIT) TABS tablet Take 1 tablet by mouth daily. 11/01/19  Yes  [provider]     Critical care time: 64

## 2022-01-18 NOTE — Evaluation (Addendum)
Physical Therapy Re-Evaluation Patient Details Name: Jeffrey Campbell MRN: [id number] DOB: 1933-01-08 Today's Date: 01/18/2022  History of Present Illness  Jeffrey Campbell is a 88 y.o. male who presents to emergency department 10/17 for evaluation of altered mental status.  Patient recently discharged on 11/18/2021 for altered mental status and UTI and has been doing well at home with home health.  On arrival to ED, patient arrives hypotensive, responsive to painful stimuli only with gurgling respirations.  VDRF10/17-10/18.     10/20 transferred out of ICU. On  10/21 PCCM reconsulted for complete whiteout of left lung, transferred to ICU and intubated, brief PEA arrest with ROSC in  4 minutes.  On 10/22, noted to have acute infarct left cerebellum per CT.  PMH HTN, HLD, T2DM, hypothyroidism, COPD, OSA, CHF, PAD, CAD with complete heart block status post pacemaker placement, mitral prolapse with mitral regurgitation, ESRD on hemodialysis Monday Wednesday Friday  Clinical Impression  Pt admitted with above diagnosis. Pt now intubated and nurse stated pt has not been as responsive. Pt did follow commands intermittently for UE and LE exercise. Pt is weaker on right than left. Will continue toward goals set next week and revise as needed but expect pt will still need SNF prior to d/c home.  40% FiO2 and PEEP 5 on vent with VSS. Will follow acutely.  Pt currently with functional limitations due to the deficits listed below (see PT Problem List). Pt will benefit from skilled PT to increase their independence and safety with mobility to allow discharge to the venue listed below.          Recommendations for follow up therapy are one component of a multi-disciplinary discharge planning process, led by the attending physician.  Recommendations may be updated based on patient status, additional functional criteria and insurance authorization.  Follow Up Recommendations Skilled nursing-short term rehab (<3  hours/day) Can patient physically be transported by private vehicle: No    Assistance Recommended at Discharge Frequent or constant Supervision/Assistance  Patient can return home with the following  A lot of help with walking and/or transfers;A lot of help with bathing/dressing/bathroom;Assistance with cooking/housework;Assist for transportation;Help with stairs or ramp for entrance    Equipment Recommendations Rolling walker (2 wheels);BSC/3in1  Recommendations for Other Services       Functional Status Assessment Patient has had a recent decline in their functional status and demonstrates the ability to make significant improvements in function in a reasonable and predictable amount of time.     Precautions / Restrictions Precautions Precautions: Fall Precaution Comments: flexi seal, VDRF Restrictions Weight Bearing Restrictions: No      Mobility  Bed Mobility               General bed mobility comments: Nurse said pt mostly unresponsive and probable bed level today.    Transfers                        Ambulation/Gait                  Stairs            Wheelchair Mobility    Modified Rankin (Stroke Patients Only) Modified Rankin (Stroke Patients Only) Pre-Morbid Rankin Score: Moderate disability Modified Rankin: Severe disability     Balance  Pertinent Vitals/Pain Pain Assessment Pain Assessment: Faces Faces Pain Scale: Hurts even more Pain Location:  (generalized) Pain Descriptors / Indicators: Discomfort, Grimacing, Guarding Pain Intervention(s): Limited activity within patient's tolerance, Monitored during session, Repositioned    Home Living                          Prior Function                       Hand Dominance   Dominant Hand: Right    Extremity/Trunk Assessment   Upper Extremity Assessment Upper Extremity Assessment: Defer  to OT evaluation    Lower Extremity Assessment Lower Extremity Assessment: RLE deficits/detail;LLE deficits/detail RLE Deficits / Details: grossly 2/5 LLE Deficits / Details: grossly 2-/5       Communication   Communication: HOH  Cognition Arousal/Alertness: Lethargic Behavior During Therapy: Flat affect Overall Cognitive Status: History of cognitive impairments - at baseline                                 General Comments: Not following commands at times. Response time delayed        General Comments General comments (skin integrity, edema, etc.): 70 bpm, 100% 40% FiO2, PEEP 5, 105/58    Exercises General Exercises - Upper Extremity Shoulder Flexion: AAROM, Both, 5 reps, Supine Shoulder Extension: AAROM, Both, 5 reps, Supine Shoulder ABduction: AAROM, Both, 5 reps, Supine Shoulder ADduction: AAROM, Both, 5 reps, Supine Elbow Flexion: AAROM, Both, 5 reps, Supine Elbow Extension: AAROM, Both, 5 reps, Supine General Exercises - Lower Extremity Ankle Circles/Pumps: AAROM, Both, 5 reps, Supine Heel Slides: AAROM, Both, 5 reps, Supine Hip ABduction/ADduction: AAROM, Both, 5 reps, Supine   Assessment/Plan    PT Assessment Patient needs continued PT services  PT Problem List Decreased activity tolerance;Decreased balance;Decreased mobility;Decreased knowledge of use of DME;Decreased safety awareness;Decreased knowledge of precautions;Cardiopulmonary status limiting activity;Decreased cognition;Decreased range of motion;Decreased strength       PT Treatment Interventions DME instruction;Gait training;Functional mobility training;Therapeutic activities;Therapeutic exercise;Balance training;Patient/family education    PT Goals (Current goals can be found in the Care Plan section)  Acute Rehab PT Goals Patient Stated Goal: unable to state due to intubated    Frequency Min 3X/week     Co-evaluation               AM-PAC PT "6 Clicks" Mobility  Outcome  Measure Help needed turning from your back to your side while in a flat bed without using bedrails?: A Lot Help needed moving from lying on your back to sitting on the side of a flat bed without using bedrails?: Total Help needed moving to and from a bed to a chair (including a wheelchair)?: Total Help needed standing up from a chair using your arms (e.g., wheelchair or bedside chair)?: Total Help needed to walk in hospital room?: Total Help needed climbing 3-5 steps with a railing? : Total 6 Click Score: 7    End of Session Equipment Utilized During Treatment: Gait belt;Oxygen Activity Tolerance: Patient limited by fatigue;Patient limited by lethargy Patient left: with call bell/phone within reach;in bed;with bed alarm set (in chair position) Nurse Communication: Mobility status;Need for lift equipment PT Visit Diagnosis: Muscle weakness (generalized) (M62.81)    Time: 2423-5361 PT Time Calculation (min) (ACUTE ONLY): 17 min   Charges:   PT Evaluation $PT Re-evaluation: 1 Re-eval  [facility] M,PT Acute Rehab Services [place] 01/18/2022, 11:24 AM

## 2022-01-18 NOTE — Progress Notes (Signed)
Nephrology Progress Note:  Subjective:  suffered PEA cardiac arrest on 10/21 and was intubated.  Last HD on 10/20 with 2.5 kg UF.  He is not on pressors.  CT on 10/22 noted small CVA.   Review of systems: unable to obtain 2/2 intubated    Objective Vital signs in last 24 hours: Vitals:   01/18/22 0430 01/18/22 0500 01/18/22 0530 01/18/22 0600  BP: (!) 92/50 (!) 99/55 (!) 101/58 116/62  Pulse: 70 70 70 70  Resp: '18 18 18 18  '$ Temp:      TempSrc:      SpO2: 99% 100% 99% 100%  Weight:      Height:       Weight change: 0 kg  Intake/Output Summary (Last 24 hours) at 01/18/2022 2025 Last data filed at 01/18/2022 0600 Gross per 24 hour  Intake 587.65 ml  Output 300 ml  Net 287.65 ml    Dialysis Orders:  MWF  - [facility]  4hrs, BFR 400, DFR AF1.5,  EDW 54.5kg, 2K/ 2Ca   Access: AVF  Heparin 3000 unit bolus Calcitriol 0.5 mcg PO qHD Sensipar '120mg'$  qHD     Assessment/Plan: AMS - likely 2/2 bacteremia and PNA. EEG with no seizure activity. Hx dementia.   Acute respiratory failure - intubated initially. Concern for aspiration on CT. Per CCM.  Able to be extubated 10/18 however required reintubation for cardiac arrest on 10/21 Aspiration PNA - noted on CT. Intubated. Abx per primary team.   Bacteremia - BC +streptococcus pneumoniae. Abx per critical care ESRD -  On HD MWF.  Await today's labs.  Plan for HD today   Hypertension/volume  - optimize volume with HD  Anemia of CKD - Hgb 9's.  No recent ESA, started  100 weekly. No iron in setting of acute infection.  Secondary Hyperparathyroidism -  on calcitriol.  No binders now Acute CVA - Small acute infarct in the inferior left cerebellum noted on recent CT on 10/22.  Per primary team  CAD, CHB s/p PPM DMT2 - per primary team  COPD - noted decreased resp reserve  Disposition - in the ICU intubated    Labs: Basic Metabolic Panel: Recent Labs  Lab 01/14/22 0453 01/15/22 0445 01/16/22 1309 01/17/22 0331  NA 138 138 138 138  K  3.3* 3.2* 2.8* 3.8  CL 96* 96*  --  98  CO2 26 24  --  26  GLUCOSE 101* 111*  --  198*  BUN 23 35*  --  38*  CREATININE 3.31* 4.55*  --  4.58*  CALCIUM 9.0 9.3  --  9.7  PHOS 3.8 3.5  3.5  --  2.5   Liver Function Tests: Recent Labs  Lab 01/24/2022 1012 01/13/22 1405 01/15/22 0445 01/17/22 0331  AST 59*  --   --   --   ALT 33  --   --   --   ALKPHOS 53  --   --   --   BILITOT 1.2  --   --   --   PROT 5.6*  --   --   --   ALBUMIN 2.8* 2.8* 2.5* 2.2*   No results for input(s): "LIPASE", "AMYLASE" in the last 168 hours. No results for input(s): "AMMONIA" in the last 168 hours. CBC: Recent Labs  Lab 01/11/2022 1012 01/18/2022 1029 01/13/22 0604 01/14/22 0453 01/15/22 0445 01/16/22 1309  WBC 7.0  --  7.9 7.6 6.2  --   NEUTROABS 5.6  --   --   --   --   --  HGB 9.6*   < > 9.2* 9.0* 9.1* 9.9*  HCT 30.5*   < > 28.0* 27.9* 27.4* 29.0*  MCV 103.7*  --  98.9 100.4* 100.0  --   PLT 94*  --  75* 82* 78*  --    < > = values in this interval not displayed.   Cardiac Enzymes: No results for input(s): "CKTOTAL", "CKMB", "CKMBINDEX", "TROPONINI" in the last 168 hours. CBG: Recent Labs  Lab 01/17/22 1257 01/17/22 1540 01/17/22 1919 01/17/22 2315 01/18/22 0314  GLUCAP 265* 214* 183* 196* 169*    Iron Studies: No results for input(s): "IRON", "TIBC", "TRANSFERRIN", "FERRITIN" in the last 72 hours. Studies/Results: CT HEAD WO CONTRAST (5MM)  Result Date: 01/17/2022 CLINICAL DATA:  Mental status change with unknown cause. Cardiac arrest, assess for [facility] injury. EXAM: CT HEAD WITHOUT CONTRAST TECHNIQUE: Contiguous axial images were obtained from the base of the skull through the vertex without intravenous contrast. RADIATION DOSE REDUCTION: This exam was performed according to the departmental dose-optimization program which includes automated exposure control, adjustment of the mA and/or kV according to patient size and/or use of iterative reconstruction technique. COMPARISON:  Five  days ago FINDINGS: Brain: A small acute infarct is seen in the inferior left cerebellum. No generalized anoxic injury seen. Advanced chronic small vessel ischemia with confluent low-density in the cerebral white matter. Generalized atrophy. No hemorrhage, hydrocephalus, or mass. Vascular: No hyperdense vessel or unexpected calcification. Skull: Normal. Negative for fracture or focal lesion. Sinuses/Orbits: No acute finding.  Bilateral cataract resection IMPRESSION: 1. Small acute infarct in the inferior left cerebellum. 2. No indication of generalized anoxic injury. 3. Atrophy and extensive chronic small vessel ischemia. Electronically Signed   By: Jorje Guild M.D.   On: 01/17/2022 11:57   DG CHEST PORT 1 VIEW  Result Date: 01/17/2022 CLINICAL DATA:  [id number].  Pleural effusion and encephalopathy. EXAM: PORTABLE CHEST 1 VIEW COMPARISON:  Portable chest yesterday at 12:04 p.m. FINDINGS: 4:44 a.m. ETT tip is 3.5 cm from carina. Feeding tube enters the stomach with the full intragastric course not seen. Left chest dual lead pacing system and wire insertions are unaltered. Overlying telemetry leads and electrical pacer pads are also again shown. The heart is enlarged. There is perihilar vascular congestion, mild interstitial consolidation in the mid and lower lungs consistent with edema. This is slightly improved today. Patchy haziness in mid to lower lung fields, greater on the right is again noted and slightly improved. This could be ground-glass edema or pneumonitis. Dense left lower lobe consolidation continues to be seen and small pleural effusions. Stable mediastinum with aortic atherosclerosis. Thoracic cage is intact. IMPRESSION: Slight improvement in vascular engorgement, interstitial edema and lung opacities, with the exception of persistent dense left lower lobe consolidation. Small pleural effusions are similar. Electronically Signed   By: Telford Nab M.D.   On: 01/17/2022 07:31   DG Abd Portable  1V  Result Date: 01/16/2022 CLINICAL DATA:  Enteric tube placement. EXAM: PORTABLE ABDOMEN - 1 VIEW COMPARISON:  01/15/2022 FINDINGS: A small bore feeding tube is noted with tip overlying the distal stomach. No other enteric tubes are noted. Bowel gas pattern is unchanged. Lower lung opacities are not significantly changed. IMPRESSION: Small bore feeding tube with tip overlying the distal stomach. Electronically Signed   By: Margarette Canada M.D.   On: 01/16/2022 15:27   DG CHEST PORT 1 VIEW  Result Date: 01/16/2022 CLINICAL DATA:  Intubation EXAM: PORTABLE CHEST 1 VIEW COMPARISON:  01/16/2022 at 0952 hours  FINDINGS: Interval placement of endotracheal tube with distal tip terminating 3.0 cm above the carina. Enteric tube courses below the diaphragm with distal tip beyond the inferior margin of the film. Right-sided implanted cardiac device remains in place. Overlying cardiac leads and pacer pad. Stable cardiomegaly. Aortic atherosclerosis. Significantly improved aeration of the left lung compared to the previous study. Bilateral perihilar interstitial opacities, right greater than left. No pneumothorax. IMPRESSION: 1. Interval placement of endotracheal tube with distal tip terminating 3.0 cm above the carina. 2. Significantly improved aeration of the left lung compared to the previous study. Electronically Signed   By: Davina Poke D.O.   On: 01/16/2022 12:31   DG CHEST PORT 1 VIEW  Result Date: 01/16/2022 CLINICAL DATA:  Pneumonia EXAM: PORTABLE CHEST 1 VIEW COMPARISON:  Chest radiograph dated 01/19/2022, CT chest dated 01/16/2022 FINDINGS: Lines/tubes: Right chest wall pacemaker with leads projecting over the right atrium and ventricle. Enteric tube tip reaches the diaphragm and terminates below the field of view. Chest: Complete opacification of the left lung. Dense right basilar opacity and patchy right mid lung opacities. Pleura: Small to moderate right and large left pleural effusions.  Heart/mediastinum: Cardiomediastinal silhouette is obscured. Aortic atherosclerosis. Bones: No acute osseous abnormality. IMPRESSION: 1. Large left and small to moderate right pleural effusions. 2. Complete opacification of the left lung. 3. Dense right basilar opacity and patchy right mid lung opacities likely reflect a combination of atelectasis and pneumonia. Electronically Signed   By: Darrin Nipper M.D.   On: 01/16/2022 10:07   Medications: Infusions:  sodium chloride Stopped (01/17/22 1952)   cefTRIAXone (ROCEPHIN)  IV Stopped (01/17/22 1348)   feeding supplement (PEPTAMEN 1.5 CAL) 1,000 mL (01/18/22 0337)   norepinephrine (LEVOPHED) Adult infusion Stopped (01/17/22 2237)    Scheduled Medications:  acetylcysteine  3 mL Nebulization TID   aspirin  81 mg Per Tube Daily   atorvastatin  20 mg Per Tube Daily   brinzolamide  1 drop Both Eyes BID   And   brimonidine  1 drop Both Eyes BID   calcitRIOL  0.5 mcg Per Tube Q M,W,F-HD   Chlorhexidine Gluconate Cloth  6 each Topical Q0600   darbepoetin (ARANESP) injection - DIALYSIS  100 mcg Intravenous Q Fri-HD   donepezil  10 mg Per Tube QHS   feeding supplement (PROSource TF20)  60 mL Per Tube Daily   heparin  5,000 Units Subcutaneous Q8H   insulin aspart  0-15 Units Subcutaneous Q4H   levothyroxine  150 mcg Per Tube Q0600   memantine  10 mg Per Tube BID   midodrine  5 mg Per Tube Q8H   multivitamin  1 tablet Per Tube QHS   mouth rinse  15 mL Mouth Rinse Q2H   pantoprazole  40 mg Per Tube Daily   sodium chloride HYPERTONIC  4 mL Nebulization BID    have reviewed scheduled and prn medications.  Physical Exam:  General adult male in bed in no acute distress HEENT normocephalic atraumatic extraocular movements intact sclera anicteric Neck supple trachea midline Lungs no air hunger Heart S1S2 no rub Abdomen soft nontender nondistended Extremities no edema  Psych no anxiety or agitation  Access: LUE AVF bruit and thrill    Claudia Desanctis, MD 01/18/2022,6:43 AM

## 2022-01-18 NOTE — Progress Notes (Signed)
Per MRI tech patient unable to have MRI tonight due to patient having pacemaker. MRI will have to be coordinated on day shift when pacemaker representative is available.

## 2022-01-18 NOTE — Progress Notes (Signed)
Called Dr. Royce Macadamia to ask for some Albumin for bp support as pt's bp is still ok right now w/ a sbp of 105 but he has been trending down and I wanted to see if she felt an Albumin order was appropriate so that I would have the order already in Epic so I could admin as soon as he went below sbp 100. Was instructed to just quit pulling fluid b/c pt had had a small stroke in the last 24 hrs and she didn't want him to go low.UF was turned off.

## 2022-01-19 ENCOUNTER — Inpatient Hospital Stay (HOSPITAL_COMMUNITY): Payer: Medicare Other

## 2022-01-19 DIAGNOSIS — A419 Sepsis, unspecified organism: Secondary | ICD-10-CM | POA: Diagnosis not present

## 2022-01-19 DIAGNOSIS — I214 Non-ST elevation (NSTEMI) myocardial infarction: Secondary | ICD-10-CM | POA: Diagnosis not present

## 2022-01-19 DIAGNOSIS — J9601 Acute respiratory failure with hypoxia: Secondary | ICD-10-CM | POA: Diagnosis not present

## 2022-01-19 DIAGNOSIS — I5021 Acute systolic (congestive) heart failure: Secondary | ICD-10-CM | POA: Diagnosis not present

## 2022-01-19 DIAGNOSIS — I469 Cardiac arrest, cause unspecified: Secondary | ICD-10-CM | POA: Diagnosis not present

## 2022-01-19 DIAGNOSIS — G9340 Encephalopathy, unspecified: Secondary | ICD-10-CM | POA: Diagnosis not present

## 2022-01-19 DIAGNOSIS — I639 Cerebral infarction, unspecified: Secondary | ICD-10-CM

## 2022-01-19 DIAGNOSIS — I4819 Other persistent atrial fibrillation: Secondary | ICD-10-CM | POA: Diagnosis not present

## 2022-01-19 LAB — CBC WITH DIFFERENTIAL/PLATELET
Abs Immature Granulocytes: 0.18 10*3/uL — ABNORMAL HIGH (ref 0.00–0.07)
Basophils Absolute: 0.1 10*3/uL (ref 0.0–0.1)
Basophils Relative: 0 %
Eosinophils Absolute: 0.2 10*3/uL (ref 0.0–0.5)
Eosinophils Relative: 2 %
HCT: 31 % — ABNORMAL LOW (ref 39.0–52.0)
Hemoglobin: 10 g/dL — ABNORMAL LOW (ref 13.0–17.0)
Immature Granulocytes: 1 %
Lymphocytes Relative: 8 %
Lymphs Abs: 1.2 10*3/uL (ref 0.7–4.0)
MCH: 32.5 pg (ref 26.0–34.0)
MCHC: 32.3 g/dL (ref 30.0–36.0)
MCV: 100.6 fL — ABNORMAL HIGH (ref 80.0–100.0)
Monocytes Absolute: 1.4 10*3/uL — ABNORMAL HIGH (ref 0.1–1.0)
Monocytes Relative: 10 %
Neutro Abs: 11 10*3/uL — ABNORMAL HIGH (ref 1.7–7.7)
Neutrophils Relative %: 79 %
Platelets: 102 10*3/uL — ABNORMAL LOW (ref 150–400)
RBC: 3.08 MIL/uL — ABNORMAL LOW (ref 4.22–5.81)
RDW: 15.3 % (ref 11.5–15.5)
WBC: 14 10*3/uL — ABNORMAL HIGH (ref 4.0–10.5)
nRBC: 0 % (ref 0.0–0.2)

## 2022-01-19 LAB — LIPID PANEL
Cholesterol: 85 mg/dL (ref 0–200)
HDL: 34 mg/dL — ABNORMAL LOW (ref >40)
LDL Cholesterol: 41 mg/dL (ref 0–99)
Total CHOL/HDL Ratio: 2.5 RATIO
Triglycerides: 50 mg/dL (ref <150)
VLDL: 10 mg/dL (ref 0–40)

## 2022-01-19 LAB — CULTURE, BAL-QUANTITATIVE W GRAM STAIN: Culture: 1000 — AB

## 2022-01-19 LAB — GLUCOSE, CAPILLARY
Glucose-Capillary: 139 mg/dL — ABNORMAL HIGH (ref 70–99)
Glucose-Capillary: 156 mg/dL — ABNORMAL HIGH (ref 70–99)
Glucose-Capillary: 157 mg/dL — ABNORMAL HIGH (ref 70–99)
Glucose-Capillary: 104 mg/dL — ABNORMAL HIGH (ref 70–99)
Glucose-Capillary: 163 mg/dL — ABNORMAL HIGH (ref 70–99)
Glucose-Capillary: 190 mg/dL — ABNORMAL HIGH (ref 70–99)

## 2022-01-19 LAB — RENAL FUNCTION PANEL
Albumin: 2.2 g/dL — ABNORMAL LOW (ref 3.5–5.0)
Anion gap: 17 — ABNORMAL HIGH (ref 5–15)
BUN: 38 mg/dL — ABNORMAL HIGH (ref 8–23)
CO2: 28 mmol/L (ref 22–32)
Calcium: 9.7 mg/dL (ref 8.9–10.3)
Chloride: 96 mmol/L — ABNORMAL LOW (ref 98–111)
Creatinine, Ser: 3.77 mg/dL — ABNORMAL HIGH (ref 0.61–1.24)
GFR, Estimated: 15 mL/min — ABNORMAL LOW (ref >60)
Glucose, Bld: 158 mg/dL — ABNORMAL HIGH (ref 70–99)
Phosphorus: 2.6 mg/dL (ref 2.5–4.6)
Potassium: 3.8 mmol/L (ref 3.5–5.1)
Sodium: 141 mmol/L (ref 135–145)

## 2022-01-19 LAB — HEPARIN LEVEL (UNFRACTIONATED): Heparin Unfractionated: <0.1 IU/mL — ABNORMAL LOW (ref 0.30–0.70)

## 2022-01-19 MED ORDER — ACETAMINOPHEN 160 MG/5ML PO SOLN
650.0000 mg | Freq: Four times a day (QID) | ORAL | Status: DC | PRN
  Administered 2022-01-19 – 2022-01-26 (×2): 650 mg
  Filled 2022-01-19: qty 20.3

## 2022-01-19 MED ORDER — HEPARIN (PORCINE) 25000 UT/250ML-% IV SOLN
1400.0000 [IU]/h | INTRAVENOUS | Status: DC
  Administered 2022-01-19: 550 [IU]/h via INTRAVENOUS
  Administered 2022-01-20: 850 [IU]/h via INTRAVENOUS
  Administered 2022-01-21: 1250 [IU]/h via INTRAVENOUS
  Filled 2022-01-19 (×3): qty 250

## 2022-01-19 MED ORDER — CHLORHEXIDINE GLUCONATE CLOTH 2 % EX PADS: 6.0000 | MEDICATED_PAD | Freq: Every day | CUTANEOUS | Status: DC

## 2022-01-19 MED ORDER — LIDOCAINE HCL (PF) 1 % IJ SOLN
5.0000 mL | Freq: Once | INTRAMUSCULAR | Status: DC
  Filled 2022-01-19: qty 5

## 2022-01-19 MED ORDER — SODIUM CHLORIDE 0.9 % IV SOLN
2.0000 g | INTRAVENOUS | Status: AC
  Administered 2022-01-19 – 2022-01-21 (×3): 2 g via INTRAVENOUS
  Filled 2022-01-19 (×3): qty 20

## 2022-01-19 MED ORDER — FENTANYL CITRATE PF 50 MCG/ML IJ SOSY
50.0000 ug | PREFILLED_SYRINGE | Freq: Once | INTRAMUSCULAR | Status: DC
  Filled 2022-01-19: qty 1

## 2022-01-19 NOTE — Progress Notes (Signed)
Rounding Note    Patient Name: Jeffrey Campbell Date of Encounter: 01/19/2022  [place] Cardiologist: Fransico Him, MD   Subjective   PEA arrest  10/21.  Remains intubated and sedated.  Very sleepy but opens his eyes.  Off pressors.   Inpatient Medications    Scheduled Meds:   stroke: early stages of recovery book   Does not apply Once   acetylcysteine  3 mL Nebulization TID   aspirin  81 mg Per Tube Daily   atorvastatin  20 mg Per Tube Daily   brinzolamide  1 drop Both Eyes BID   And   brimonidine  1 drop Both Eyes BID   calcitRIOL  0.5 mcg Per Tube Q M,W,F-HD   Chlorhexidine Gluconate Cloth  6 each Topical Q0600   Chlorhexidine Gluconate Cloth  6 each Topical Q0600   donepezil  10 mg Per Tube QHS   feeding supplement (PROSource TF20)  60 mL Per Tube Daily   fiber supplement (BANATROL TF)  60 mL Per Tube BID   insulin aspart  0-15 Units Subcutaneous Q4H   levothyroxine  150 mcg Per Tube Q0600   memantine  10 mg Per Tube BID   midodrine  5 mg Per Tube Q8H   multivitamin  1 tablet Per Tube QHS   mouth rinse  15 mL Mouth Rinse Q2H   pantoprazole  40 mg Per Tube Daily   sodium chloride HYPERTONIC  4 mL Nebulization BID   Continuous Infusions:  sodium chloride Stopped (01/18/22 1700)   cefTRIAXone (ROCEPHIN)  IV     feeding supplement (PEPTAMEN 1.5 CAL) 1,000 mL (01/19/22 0130)   norepinephrine (LEVOPHED) Adult infusion Stopped (01/18/22 2322)   PRN Meds: sodium chloride, acetaminophen (TYLENOL) oral liquid 160 mg/5 mL, albuterol, docusate, fentaNYL (SUBLIMAZE) injection, mouth rinse, polyethylene glycol   Vital Signs    Vitals:   01/19/22 0730 01/19/22 0731 01/19/22 0800 01/19/22 0900  BP: (!) 126/54  (!) 135/59   Pulse: 69  70   Resp: 15  16   Temp:      TempSrc:      SpO2: 98% 98% 98%   Weight:    59.1 kg  Height:    '5\' 9"'$  (1.753 m)    Intake/Output Summary (Last 24 hours) at 01/19/2022 0955 Last data filed at 01/19/2022 0800 Gross per 24  hour  Intake 711.03 ml  Output 1270 ml  Net -558.97 ml       01/19/2022    9:00 AM 01/19/2022    5:00 AM 01/18/2022    4:50 PM  Last 3 Weights  Weight (lbs) 130 lb 4.7 oz 130 lb 4.7 oz 128 lb 15.5 oz  Weight (kg) 59.1 kg 59.1 kg 58.5 kg      Telemetry    Atrial flutter with V paced rhythm- Personally Reviewed  ECG    No new ECG - Personally Reviewed  Physical Exam   GEN: intubated and sedated HEENT: Normal NECK: No JVD; No carotid bruits LYMPHATICS: No lymphadenopathy CARDIAC:RRR, no murmurs, rubs, gallops RESPIRATORY:  scattered rhonchi anteriorly ABDOMEN: Soft, non-tender, non-distended MUSCULOSKELETAL:  No edema; No deformity  SKIN: Warm and dry NEUROLOGIC:  cannot assess PSYCHIATRIC: cannot assess Labs    High Sensitivity Troponin:   Recent Labs  Lab 01/07/2022 1317 01/18/2022 1453  TROPONINIHS 2,957* 4,963*      Chemistry Recent Labs  Lab 01/11/2022 1012 12/28/2021 1029 01/13/22 1654 01/14/22 0453 01/15/22 0445 01/16/22 1309 01/17/22 0331 01/18/22 0714 01/19/22  0316  NA 139   < >  --  138 138   < > 138 141 141  K 3.3*   < >  --  3.3* 3.2*   < > 3.8 3.9 3.8  CL 98   < >  --  96* 96*  --  98 103 96*  CO2 29   < >  --  26 24  --  '26 26 28  '$ GLUCOSE 67*   < >  --  101* 111*  --  198* 190* 158*  BUN 30*   < >  --  23 35*  --  38* 58* 38*  CREATININE 4.66*   < >  --  3.31* 4.55*  --  4.58* 5.73* 3.77*  CALCIUM 8.5*   < >  --  9.0 9.3  --  9.7 9.9 9.7  MG  --    < > 1.5* 1.6* 2.2  --   --   --   --   PROT 5.6*  --   --   --   --   --   --   --   --   ALBUMIN 2.8*   < >  --   --  2.5*  --  2.2* 2.1* 2.2*  AST 59*  --   --   --   --   --   --   --   --   ALT 33  --   --   --   --   --   --   --   --   ALKPHOS 53  --   --   --   --   --   --   --   --   BILITOT 1.2  --   --   --   --   --   --   --   --   GFRNONAA 12*   < >  --  17* 12*  --  12* 9* 15*  ANIONGAP 12   < >  --  16* 18*  --  14 12 17*   < > = values in this interval not displayed.      Lipids  Recent Labs  Lab 01/19/22 0316  CHOL 85  TRIG 50  HDL 34*  LDLCALC 41  CHOLHDL 2.5     Hematology Recent Labs  Lab 01/15/22 0445 01/16/22 1309 01/18/22 0714 01/19/22 0316  WBC 6.2  --  8.6 14.0*  RBC 2.74*  --  2.68* 3.08*  HGB 9.1* 9.9* 8.8* 10.0*  HCT 27.4* 29.0* 27.1* 31.0*  MCV 100.0  --  101.1* 100.6*  MCH 33.2  --  32.8 32.5  MCHC 33.2  --  32.5 32.3  RDW 14.9  --  15.3 15.3  PLT 78*  --  89* 102*    Thyroid  Recent Labs  Lab 01/11/2022 1012 01/13/22 1405  TSH 5.816*  --   FREET4  --  1.00     BNP Recent Labs  Lab 01/18/2022 1012  BNP 1,904.9*     DDimer No results for input(s): "DDIMER" in the last 168 hours.   Radiology    DG CHEST PORT 1 VIEW  Result Date: 01/19/2022 CLINICAL DATA:  Ventilator dependence. EXAM: PORTABLE CHEST 1 VIEW COMPARISON:  01/17/2022 FINDINGS: 0823 hours. Endotracheal tube tip is 3.7 cm above the base of the carina. A feeding tube passes into the stomach although the distal tip position is not included on the  film. The cardio pericardial silhouette is enlarged. Right base collapse/consolidation is progressive in the interval with new 3 cm nodular component on today's exam. There is persistent retrocardiac left base collapse/consolidation with tiny left effusion. Interstitial markings are diffusely coarsened with chronic features. Telemetry leads overlie the chest. IMPRESSION: 1. Interval progression of right base collapse/consolidation with new 3 cm nodular component on today's exam. Close follow-up recommended. 2. Persistent retrocardiac left base collapse/consolidation with tiny left effusion. Electronically Signed   By: Misty Stanley M.D.   On: 01/19/2022 08:33   CT HEAD WO CONTRAST (5MM)  Result Date: 01/17/2022 CLINICAL DATA:  Mental status change with unknown cause. Cardiac arrest, assess for [facility] injury. EXAM: CT HEAD WITHOUT CONTRAST TECHNIQUE: Contiguous axial images were obtained from the base of the skull  through the vertex without intravenous contrast. RADIATION DOSE REDUCTION: This exam was performed according to the departmental dose-optimization program which includes automated exposure control, adjustment of the mA and/or kV according to patient size and/or use of iterative reconstruction technique. COMPARISON:  Five days ago FINDINGS: Brain: A small acute infarct is seen in the inferior left cerebellum. No generalized anoxic injury seen. Advanced chronic small vessel ischemia with confluent low-density in the cerebral white matter. Generalized atrophy. No hemorrhage, hydrocephalus, or mass. Vascular: No hyperdense vessel or unexpected calcification. Skull: Normal. Negative for fracture or focal lesion. Sinuses/Orbits: No acute finding.  Bilateral cataract resection IMPRESSION: 1. Small acute infarct in the inferior left cerebellum. 2. No indication of generalized anoxic injury. 3. Atrophy and extensive chronic small vessel ischemia. Electronically Signed   By: Jorje Guild M.D.   On: 01/17/2022 11:57    Cardiac Studies  2D echo 01/24/2022 IMPRESSIONS    1. Left ventricular ejection fraction, by estimation, is 45 to 50%. Left  ventricular ejection fraction by 2D MOD biplane is 46.3 %. The left  ventricle has mildly decreased function. The left ventricle demonstrates  global hypokinesis. There is moderate  left ventricular hypertrophy. Left ventricular diastolic parameters are  consistent with Grade III diastolic dysfunction (restrictive). Elevated  left ventricular end-diastolic pressure.   2. Right ventricular systolic function is moderately reduced. The right  ventricular size is mildly enlarged. There is moderately elevated  pulmonary artery systolic pressure. The estimated right ventricular  systolic pressure is 82.5 mmHg.   3. Left atrial size was severely dilated.   4. Right atrial size was severely dilated.   5. The mitral valve is abnormal. Mild mitral valve regurgitation.   6. The  tricuspid valve is abnormal. Tricuspid valve regurgitation is mild  to moderate.   7. The aortic valve is tricuspid. Aortic valve regurgitation is trivial.  Aortic valve sclerosis is present, with no evidence of aortic valve  stenosis.   8. Mildly dilated pulmonary artery.   9. The inferior vena cava is dilated in size with <50% respiratory  variability, suggesting right atrial pressure of 15 mmHg.   Comparison(s): Changes from prior study are noted. 01/05/2019: LVEF 55-60%.   Patient Profile     88 y.o. male with a history of CAD, ESRD, PAD, COPD, chronic diastolic heart failure, pulmonary hypertension, dementia, OSA, hypertension, persistent atrial fibrillation, complete heart block status post PPM who we are consulted by Dr. Tacy Learn for evaluation of troponin elevation  Assessment & Plan    Elevated troponin: troponin elevation to 4963.  Suspect demand ischemia in setting of septic shock and acute hypoxic respiratory failure, but likely in setting of underlying obstructive CAD (cath from 2012 showed 50  to 60% ostial LAD, 50 to 60% mid LAD, 60% proximal RCA stenosis).  Echo 10/17 showed EF 45-50%, global hypokinesis, G3DD, moderate RV dysfunction, severe biatrial enlargement -Continue ASA, statin  -no BB as Bp was soft overnight -Given age and comorbidities including dementia and considering only mild systolic dysfunction on echo, would plan medical management at this time  Acute systolic heart failure: EF 45-50% on echo with global hypokinesis, suspect due to sepsis-induced cardiomyopathy but ischemia on differential as above -Hold GDMT due to soft BP>>BP was soft overnight  Septic shock: due to aspiration pneumonia.   -Resolved, was off pressors but had PEA arrest 10/21 and went back on low dose levophed.   -now weaned off Levo -Abx per primary team.  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  No new recs at this time - will follow at a distance  I have spent a total of 30 minutes with patient reviewing 2D echo , telemetry, EKGs, labs and examining patient as well as establishing an assessment and plan that was discussed with the patient.  > 50% of time was spent in direct patient care.     For questions or updates, please contact [facility] consult [email or web address] for contact info under    CRITICAL CARE TIME: I have spent a total of 30 minutes with patient reviewing hospital notes, telemetry, EKGs, labs and examining the patient as well as establishing an assessment and plan. The patient is critically ill with multi-organ system failure and requires high complexity decision making for assessment and support, frequent evaluation and titration of therapies, application of advanced monitoring technologies and extensive interpretation of multiple databases.    Signed, TrFransico HimMD  01/19/2022, 9:55 AM

## 2022-01-19 NOTE — Progress Notes (Signed)
[place] Progress Note Patient Name: Jeffrey Campbell DOB: Mar 18, 1933 MRN: [id number]   Date of Service  01/19/2022  HPI/Events of Note  Pt febrile to 102  eICU Interventions  PRN acetaminophen ordered.  PT already on empiric antibiotic therapy.         [place] 01/19/2022, 3:33 AM

## 2022-01-19 NOTE — Plan of Care (Signed)
  Problem: Safety: Goal: Non-violent Restraint(s) Outcome: Progressing   Problem: Education: Goal: Ability to describe self-care measures that may prevent or decrease complications (Diabetes Survival Skills Education) will improve Outcome: Progressing   Problem: Coping: Goal: Ability to adjust to condition or change in health will improve Outcome: Progressing   Problem: Fluid Volume: Goal: Ability to maintain a balanced intake and output will improve Outcome: Progressing   Problem: Health Behavior/Discharge Planning: Goal: Ability to identify and utilize available resources and services will improve Outcome: Progressing Goal: Ability to manage health-related needs will improve Outcome: Progressing   Problem: Metabolic: Goal: Ability to maintain appropriate glucose levels will improve Outcome: Progressing   Problem: Nutritional: Goal: Maintenance of adequate nutrition will improve Outcome: Progressing Goal: Progress toward achieving an optimal weight will improve Outcome: Progressing   Problem: Skin Integrity: Goal: Risk for impaired skin integrity will decrease Outcome: Progressing   Problem: Tissue Perfusion: Goal: Adequacy of tissue perfusion will improve Outcome: Progressing   Problem: Education: Goal: Knowledge of General Education information will improve Description: Including pain rating scale, medication(s)/side effects and non-pharmacologic comfort measures Outcome: Progressing   Problem: Health Behavior/Discharge Planning: Goal: Ability to manage health-related needs will improve Outcome: Progressing   Problem: Clinical Measurements: Goal: Ability to maintain clinical measurements within normal limits will improve Outcome: Progressing Goal: Will remain free from infection Outcome: Progressing Goal: Diagnostic test results will improve Outcome: Progressing Goal: Respiratory complications will improve Outcome: Progressing Goal: Cardiovascular  complication will be avoided Outcome: Progressing   Problem: Activity: Goal: Risk for activity intolerance will decrease Outcome: Progressing   Problem: Nutrition: Goal: Adequate nutrition will be maintained Outcome: Progressing   Problem: Coping: Goal: Level of anxiety will decrease Outcome: Progressing   Problem: Elimination: Goal: Will not experience complications related to bowel motility Outcome: Progressing Goal: Will not experience complications related to urinary retention Outcome: Progressing   Problem: Pain Managment: Goal: General experience of comfort will improve Outcome: Progressing   Problem: Safety: Goal: Ability to remain free from injury will improve Outcome: Progressing   Problem: Skin Integrity: Goal: Risk for impaired skin integrity will decrease Outcome: Progressing   Problem: Education: Goal: Knowledge of disease or condition will improve Outcome: Progressing Goal: Knowledge of secondary prevention will improve (MUST DOCUMENT ALL) Outcome: Progressing Goal: Knowledge of patient specific risk factors will improve Elta [place] N/A or DELETE if not current risk factor) Outcome: Progressing   Problem: Ischemic Stroke/TIA Tissue Perfusion: Goal: Complications of ischemic stroke/TIA will be minimized Outcome: Progressing   Problem: Coping: Goal: Will verbalize positive feelings about self Outcome: Progressing Goal: Will identify appropriate support needs Outcome: Progressing   Problem: Health Behavior/Discharge Planning: Goal: Ability to manage health-related needs will improve Outcome: Progressing Goal: Goals will be collaboratively established with patient/family Outcome: Progressing   Problem: Self-Care: Goal: Ability to participate in self-care as condition permits will improve Outcome: Progressing Goal: Verbalization of feelings and concerns over difficulty with self-care will improve Outcome: Progressing Goal: Ability to communicate needs  accurately will improve Outcome: Progressing   Problem: Nutrition: Goal: Risk of aspiration will decrease Outcome: Progressing Goal: Dietary intake will improve Outcome: Progressing

## 2022-01-19 NOTE — Progress Notes (Signed)
Ventilator patient transported from 2M05 to MRI and back without any complications.

## 2022-01-19 NOTE — Progress Notes (Signed)
Carotid artery duplex completed. Refer to "CV Proc" under chart review to view preliminary results.  01/19/2022 4:36 PM Kelby Aline., MHA, RVT, RDCS, RDMS

## 2022-01-19 NOTE — Progress Notes (Signed)
Updated daughter at bedside  Findings on MRI consistent with stroke  Consent obtained for bronchoscopy for pulmonary toileting  Continue other lines of care

## 2022-01-19 NOTE — Progress Notes (Signed)
SLP Cancellation Note  Patient Details Name: Jeffrey Campbell MRN: [id number] DOB: 07-23-1932   Cancelled treatment:   Pt remains on ventilator. Will follow along for readiness for SLP interventions.  Jabin Tapp L. Tivis Ringer, MA CCC/SLP Clinical Specialist - Acute Care SLP Acute Rehabilitation Services Office number [phone]         Juan Quam Laurice 01/19/2022, 7:38 AM

## 2022-01-19 NOTE — Progress Notes (Signed)
ANTICOAGULATION CONSULT NOTE - Initial Consult  Pharmacy Consult for Heparin Indication: atrial fibrillation  Allergies  Allergen Reactions   Cardura [Doxazosin] Shortness Of Breath and Other (See Comments)    Dizziness    Hydrochlorothiazide Shortness Of Breath and Other (See Comments)    Dizziness    Iodinated Contrast Media Other (See Comments)    "shut down his kidneys" Patient has stage III chronic kidney disease   Zocor [Simvastatin] Other (See Comments)    Kidney problems    Protonix [Pantoprazole] Other (See Comments)    Worsening kidney problems   Rocaltrol [Calcitriol] Other (See Comments)    Unknown reaction   Budesonide-Formoterol Fumarate Other (See Comments)    Dry mouth   Minodyl [Minoxidil] Other (See Comments)    Unknown raction   Tiotropium Bromide Monohydrate Other (See Comments)    Dry mouth   Tricor [Fenofibrate]     Unknown reaction    Patient Measurements: Height: '5\' 9"'$  (175.3 cm) Weight: 59.1 kg (130 lb 4.7 oz) IBW/kg (Calculated) : 70.7 Heparin Dosing Weight: 59 kg   Vital Signs: Temp: 98.1 F (36.7 C) (10/24 0720) Temp Source: Oral (10/24 0720) BP: 135/59 (10/24 0800) Pulse Rate: 70 (10/24 0800)  Labs: Recent Labs    01/16/22 1309 01/17/22 0331 01/18/22 0714 01/19/22 0316  HGB 9.9*  --  8.8* 10.0*  HCT 29.0*  --  27.1* 31.0*  PLT  --   --  89* 102*  CREATININE  --  4.58* 5.73* 3.77*    Estimated Creatinine Clearance: 11.8 mL/min (A) (by C-G formula based on SCr of 3.77 mg/dL (H)).   Medical History: Past Medical History:  Diagnosis Date   Anemia    low iron   BPH (benign prostatic hyperplasia)    CAD (coronary artery disease)    Carotid artery stenosis    1-39% bilateral carotid artery stenosis and < 50% stenosis in the right CCA by dopplers 06/2017   CKD (chronic kidney disease), stage III (HCC)    Stage 4   Diastolic dysfunction    Glaucoma    Graves disease    History of ETOH abuse    Hyperlipemia    Hypertension     Hyperthyroidism 08/26/10   radioactive iodine therapy    Memory loss    Mitral regurgitation echo 2015   mild   Multiple thyroid nodules    MVP (mitral valve prolapse) 11/2012   posterior MVP   OSA (obstructive sleep apnea)    upper airway resistance syndrome with RDI 18/hr - not on CPAP due to insurance not covering   Pre-diabetes    PUD (peptic ulcer disease)    Pulmonary hypertension ([place]) echo 2015   Group 2 with pulmonary venous HTN and Group 3 with OSA   Upper airway resistance syndrome     Assessment: 88 yo male admitted 01/14/2022 found to have strep pneumonia bacteremia and PNA. Now found to have small infarct in L cerebellum. Pharmacy consulted for IV heparin. Hgb 10. Plts 102.    Goal of Therapy:  Heparin level 0.3-0.5 units/ml Monitor platelets by anticoagulation protocol: Yes   Plan:  Start heparin 550 units/hr No bolus with CVA Check 8 hr heparin level  Monitor heparin level, CBC and s/s of bleeding daily  Watch plts closely   Cristela Felt, PharmD, BCPS Clinical Pharmacist 01/19/2022 9:47 AM

## 2022-01-19 NOTE — Progress Notes (Signed)
Nephrology Progress Note:  Subjective:  Last HD on 10/23 with 1 kg UF.   He has had a lot of secretions overnight per nursing. An MRI is planned for today but no plans for extubation.   Review of systems: unable to obtain 2/2 intubated   Notable recent events:  suffered PEA cardiac arrest on 10/21 and was intubated.  CT on 10/22 noted small CVA.    Objective Vital signs in last 24 hours: Vitals:   01/19/22 0404 01/19/22 0413 01/19/22 0430 01/19/22 0500  BP:   (!) 107/46   Pulse:  70 70   Resp:  20 18   Temp: 100.2 F (37.9 C)     TempSrc: Axillary     SpO2:  98% 98%   Weight:    59.1 kg  Height:       Weight change: 0 kg  Intake/Output Summary (Last 24 hours) at 01/19/2022 0616 Last data filed at 01/19/2022 0000 Gross per 24 hour  Intake 511.03 ml  Output 1070 ml  Net -558.97 ml    Dialysis Orders:  MWF  - [facility]  4hrs, BFR 400, DFR AF1.5,  EDW 54.5kg, 2K/ 2Ca   Access: AVF  Heparin 3000 unit bolus Calcitriol 0.5 mcg PO qHD Sensipar '120mg'$  qHD     Assessment/Plan: AMS - likely 2/2 bacteremia and PNA. EEG with no seizure activity. Hx dementia.   Acute respiratory failure - intubated initially. Concern for aspiration on CT. Per CCM.  Able to be extubated 10/18 however required reintubation for cardiac arrest on 10/21.  Vent per critical care.  Optimize volume with HD as well - we didn't attempt aggressive UF on 10/23 treatment due to recent stroke  Aspiration PNA - noted on CT. Abx per primary team.   Bacteremia - BC +streptococcus pneumoniae. Abx per critical care ESRD -  On HD MWF schedule   Hypertension/volume  - optimize volume with HD  Anemia of CKD - off of ESA now in setting of acute stroke.  No iron in setting of acute infection.  Secondary Hyperparathyroidism -  on calcitriol.  No binders now Acute CVA - Small acute infarct in the inferior left cerebellum noted on recent CT on 10/22.  Per primary team  CAD, CHB s/p PPM DMT2 - per primary team  COPD - noted  decreased resp reserve  Disposition - in the ICU; per primary team   Labs: Basic Metabolic Panel: Recent Labs  Lab 01/17/22 0331 01/18/22 0714 01/19/22 0316  NA 138 141 141  K 3.8 3.9 3.8  CL 98 103 96*  CO2 '26 26 28  '$ GLUCOSE 198* 190* 158*  BUN 38* 58* 38*  CREATININE 4.58* 5.73* 3.77*  CALCIUM 9.7 9.9 9.7  PHOS 2.5 1.6* 2.6   Liver Function Tests: Recent Labs  Lab 01/02/2022 1012 01/13/22 1405 01/17/22 0331 01/18/22 0714 01/19/22 0316  AST 59*  --   --   --   --   ALT 33  --   --   --   --   ALKPHOS 53  --   --   --   --   BILITOT 1.2  --   --   --   --   PROT 5.6*  --   --   --   --   ALBUMIN 2.8*   < > 2.2* 2.1* 2.2*   < > = values in this interval not displayed.   No results for input(s): "LIPASE", "AMYLASE" in the last 168 hours. No  results for input(s): "AMMONIA" in the last 168 hours. CBC: Recent Labs  Lab 01/14/2022 1012 01/10/2022 1029 01/13/22 0604 01/14/22 0453 01/15/22 0445 01/16/22 1309 01/18/22 0714 01/19/22 0316  WBC 7.0  --  7.9 7.6 6.2  --  8.6 14.0*  NEUTROABS 5.6  --   --   --   --   --  6.7 11.0*  HGB 9.6*   < > 9.2* 9.0* 9.1* 9.9* 8.8* 10.0*  HCT 30.5*   < > 28.0* 27.9* 27.4* 29.0* 27.1* 31.0*  MCV 103.7*  --  98.9 100.4* 100.0  --  101.1* 100.6*  PLT 94*  --  75* 82* 78*  --  89* 102*   < > = values in this interval not displayed.   Cardiac Enzymes: No results for input(s): "CKTOTAL", "CKMB", "CKMBINDEX", "TROPONINI" in the last 168 hours. CBG: Recent Labs  Lab 01/18/22 1132 01/18/22 1522 01/18/22 1913 01/18/22 2332 01/19/22 0321  GLUCAP 196* 179* 163* 154* 139*    Iron Studies: No results for input(s): "IRON", "TIBC", "TRANSFERRIN", "FERRITIN" in the last 72 hours. Studies/Results: CT HEAD WO CONTRAST (5MM)  Result Date: 01/17/2022 CLINICAL DATA:  Mental status change with unknown cause. Cardiac arrest, assess for [facility] injury. EXAM: CT HEAD WITHOUT CONTRAST TECHNIQUE: Contiguous axial images were obtained from the base of  the skull through the vertex without intravenous contrast. RADIATION DOSE REDUCTION: This exam was performed according to the departmental dose-optimization program which includes automated exposure control, adjustment of the mA and/or kV according to patient size and/or use of iterative reconstruction technique. COMPARISON:  Five days ago FINDINGS: Brain: A small acute infarct is seen in the inferior left cerebellum. No generalized anoxic injury seen. Advanced chronic small vessel ischemia with confluent low-density in the cerebral white matter. Generalized atrophy. No hemorrhage, hydrocephalus, or mass. Vascular: No hyperdense vessel or unexpected calcification. Skull: Normal. Negative for fracture or focal lesion. Sinuses/Orbits: No acute finding.  Bilateral cataract resection IMPRESSION: 1. Small acute infarct in the inferior left cerebellum. 2. No indication of generalized anoxic injury. 3. Atrophy and extensive chronic small vessel ischemia. Electronically Signed   By: Jorje Guild M.D.   On: 01/17/2022 11:57   Medications: Infusions:  sodium chloride Stopped (01/18/22 1700)   cefTRIAXone (ROCEPHIN)  IV Stopped (01/18/22 1630)   feeding supplement (PEPTAMEN 1.5 CAL) 1,000 mL (01/19/22 0130)   norepinephrine (LEVOPHED) Adult infusion Stopped (01/18/22 2322)    Scheduled Medications:   stroke: early stages of recovery book   Does not apply Once   acetylcysteine  3 mL Nebulization TID   aspirin  81 mg Per Tube Daily   atorvastatin  20 mg Per Tube Daily   brinzolamide  1 drop Both Eyes BID   And   brimonidine  1 drop Both Eyes BID   calcitRIOL  0.5 mcg Per Tube Q M,W,F-HD   Chlorhexidine Gluconate Cloth  6 each Topical Q0600   Chlorhexidine Gluconate Cloth  6 each Topical Q0600   donepezil  10 mg Per Tube QHS   feeding supplement (PROSource TF20)  60 mL Per Tube Daily   fiber supplement (BANATROL TF)  60 mL Per Tube BID   heparin  5,000 Units Subcutaneous Q8H   insulin aspart  0-15 Units  Subcutaneous Q4H   levothyroxine  150 mcg Per Tube Q0600   memantine  10 mg Per Tube BID   midodrine  5 mg Per Tube Q8H   multivitamin  1 tablet Per Tube QHS   mouth rinse  15 mL Mouth Rinse Q2H   pantoprazole  40 mg Per Tube Daily   sodium chloride HYPERTONIC  4 mL Nebulization BID    have reviewed scheduled and prn medications.  Physical Exam:    General adult male in bed in no acute distress HEENT normocephalic atraumatic sclera anicteric Neck supple trachea midline Lungs no air hunger Heart S1S2 no rub Abdomen soft nontender nondistended Extremities 1+ edema upper and lower ext Psych no anxiety or agitation  Neuro - no sedation running Access: LUE AVF bruit    Claudia Desanctis, MD 01/19/2022,6:27 AM

## 2022-01-19 NOTE — Progress Notes (Addendum)
NAME:  Jeffrey Campbell, MRN:  [id number], DOB:  1932/09/07, LOS: 7 ADMISSION DATE:  01/02/2022, CONSULTATION DATE:  01/02/2022 REFERRING MD:  EDP, CHIEF COMPLAINT:  AMS. ARF   History of Present Illness:  88 year old male with extensive PMH including ESRD on HD MWF. Presents to ED on 10/17 after being found unresponsive at home. Per family patient recent admission 8/23 for altered mental status, UTI. Now with reported tiredness, weakness over last 24 hours. This morning when daughter arrived at patient home, she reports patient was unresponsive making gurgling sounds, on arrival to ED patient remains minimally responsive with tachypnea and hypotension requiring intubation. CXR with left lower lobe infiltrate. CT head negative. CT C/A/P with extensive bilateral infiltirates and noted left lower lobe infiltrate concerning for aspiration. Given Cefepime/Vancomycin. Critical Care Consulted for admission.   Pertinent  Medical History  HTN, DMII, CAD, CHB h/o PPM, ESRD on HD MWF, COPD,   Significant Hospital Events: Including procedures, antibiotic start and stop dates in addition to other pertinent events   ETT 10/17-10/18 10/20 transferred out of ICU 10/21 PCCM reconsulted for complete whiteout of left lung, transferred to ICU and intubated, brief PEA arrest with ROSC in  4 minutes I/O placed 10/22 CT Head: Small acute infarct in the inferior left cerebellum  Interim History / Subjective:  Increased secretions overnight Awake and alert this morning Following commands   Objective   Blood pressure (!) 135/59, pulse 70, temperature 98.1 F (36.7 C), temperature source Oral, resp. rate 16, height '5\' 9"'$  (1.753 m), weight 59.1 kg, SpO2 98 %.    Vent Mode: PRVC FiO2 (%):  [40 %] 40 % Set Rate:  [18 bmp] 18 bmp Vt Set:  [560 mL] 560 mL PEEP:  [5 cmH20] 5 cmH20 Pressure Support:  [10 cmH20] 10 cmH20 Plateau Pressure:  [19 cmH20-27 cmH20] 27 cmH20   Intake/Output Summary (Last 24 hours) at  01/19/2022 0853 Last data filed at 01/19/2022 0800 Gross per 24 hour  Intake 711.03 ml  Output 1270 ml  Net -558.97 ml   Filed Weights   01/18/22 1230 01/18/22 1650 01/19/22 0500  Weight: 58.8 kg 58.5 kg 59.1 kg    Examination: General: Acutely ill-appearing elderly man. NAD HENT: [place]/AT. MMM Lungs: ON Vent. Rhonchus lungs sounds. No WOB Cardiovascular: V-Paced-rhythm. No m/r/g.  Abdomen: Soft. NT/ND Extremities: Warm. Faint DP pulses Neuro: Alert and awake. Follows commands. Moving all extremities  WBC 14, hgb 10, plt 102 sCr 3.77, K+ 3.8 CBGs 130s-170s  Resolved Hospital Problem list   Septic shock  Assessment & Plan:  Septic Shock 2/2 Aspiration PNA S/p PEA cardiac arrest postintubation due to hypoxia, ROSC in 4 minutes Off pressors and maintaining MAPs in the 70s. Increased in WC and increased secretions.  -Continue midodrine -Continue rocephin for now  Acute hypoxic Respiratory Failure 2/2 to Aspiration Pneumonia and left-sided mucous plugging  S/p intubation and bronchoscopy on 10/21. Failed SBT yesterday. Increased secretions overnight. Repeat CXR showing interval progression of right base collapse/consolidation with new 3 cm nodular component -Repeat bronch -Pulm toilet, nebs and chest PT -SBT -Abx as above  Acute Left cerebellum infarct Acute septic encephalopathy  Postanoxic injury Advanced dementia CT Head on 10/22 showed small acute infarct in the inferior left cerebellum. Following commands today.  -Neurology following, appreciate recs -Stroke work/up w/ MRI brain, MRA Head -Heparin for anticoagulation -PT recommending SNF -ASA, statin -Will consider palliative care consult if unable wean off vent   ESRD on HD MWF Anemia  of Chronic Disease Off pressors. Kidney function stable -Nephro following -Avoid nephrotoxic agents   Troponemia Hx of CHB s/p pacemaker Acute systolic CHF Atrial fibrillation TTE showed global hypokinesis with elevated  Tropon 4,963. Demand ischemia in the setting of septic shock.  Cardiology following, appreciate recs Continue medical mgmt w/ ASA, statin Tele  Hypothyroidism Continue levothyroxine   DMII CBGs 180s-190s Continue SSI. Goal 140-180   Severe protein calorie malnutrition Continue Tube feeds  Best Practice (right click and "Reselect all SmartList Selections" daily)   Diet/type: tubefeeds and NPO DVT prophylaxis: prophylactic heparin  GI prophylaxis: PPI Lines: N/A Foley:  N/A Code Status:  full code Last date of multidisciplinary goals of care discussion [Pending]  Labs   CBC: Recent Labs  Lab 01/03/2022 1012 01/13/2022 1029 01/13/22 0604 01/14/22 0453 01/15/22 0445 01/16/22 1309 01/18/22 0714 01/19/22 0316  WBC 7.0  --  7.9 7.6 6.2  --  8.6 14.0*  NEUTROABS 5.6  --   --   --   --   --  6.7 11.0*  HGB 9.6*   < > 9.2* 9.0* 9.1* 9.9* 8.8* 10.0*  HCT 30.5*   < > 28.0* 27.9* 27.4* 29.0* 27.1* 31.0*  MCV 103.7*  --  98.9 100.4* 100.0  --  101.1* 100.6*  PLT 94*  --  75* 82* 78*  --  89* 102*   < > = values in this interval not displayed.    Basic Metabolic Panel: Recent Labs  Lab 01/24/2022 1453 01/13/22 0604 01/13/22 1405 01/13/22 1654 01/14/22 0453 01/15/22 0445 01/16/22 1309 01/17/22 0331 01/18/22 0714 01/19/22 0316  NA  --  138   < >  --  138 138 138 138 141 141  K  --  3.4*   < >  --  3.3* 3.2* 2.8* 3.8 3.9 3.8  CL  --  97*   < >  --  96* 96*  --  98 103 96*  CO2  --  24   < >  --  26 24  --  '26 26 28  '$ GLUCOSE  --  66*   < >  --  101* 111*  --  198* 190* 158*  BUN  --  46*   < >  --  23 35*  --  38* 58* 38*  CREATININE  --  5.59*   < >  --  3.31* 4.55*  --  4.58* 5.73* 3.77*  CALCIUM  --  8.9   < >  --  9.0 9.3  --  9.7 9.9 9.7  MG 1.5* 1.7  --  1.5* 1.6* 2.2  --   --   --   --   PHOS 3.8 3.9   < > 1.7* 3.8 3.5  3.5  --  2.5 1.6* 2.6   < > = values in this interval not displayed.   GFR: Estimated Creatinine Clearance: 11.8 mL/min (A) (by C-G formula  based on SCr of 3.77 mg/dL (H)). Recent Labs  Lab 01/01/2022 1012 01/10/2022 1317 01/13/22 0604 01/14/22 0453 01/15/22 0445 01/18/22 0714 01/19/22 0316  WBC 7.0  --    < > 7.6 6.2 8.6 14.0*  LATICACIDVEN 3.4* 3.8*  --   --   --   --   --    < > = values in this interval not displayed.    Liver Function Tests: Recent Labs  Lab 12/29/2021 1012 01/13/22 1405 01/15/22 0445 01/17/22 0331 01/18/22 0714 01/19/22 0316  AST 59*  --   --   --   --   --  ALT 33  --   --   --   --   --   ALKPHOS 53  --   --   --   --   --   BILITOT 1.2  --   --   --   --   --   PROT 5.6*  --   --   --   --   --   ALBUMIN 2.8* 2.8* 2.5* 2.2* 2.1* 2.2*   No results for input(s): "LIPASE", "AMYLASE" in the last 168 hours. No results for input(s): "AMMONIA" in the last 168 hours.  ABG    Component Value Date/Time   PHART 7.497 (H) 01/16/2022 1309   PCO2ART 35.4 01/16/2022 1309   PO2ART 73 (L) 01/16/2022 1309   HCO3 27.5 01/16/2022 1309   TCO2 29 01/16/2022 1309   ACIDBASEDEF 5.0 (H) 07/28/2016 0850   ACIDBASEDEF 5.0 (H) 07/28/2016 0850   O2SAT 96 01/16/2022 1309     Coagulation Profile: Recent Labs  Lab 01/01/2022 1012  INR 1.5*    Cardiac Enzymes: No results for input(s): "CKTOTAL", "CKMB", "CKMBINDEX", "TROPONINI" in the last 168 hours.  HbA1C: Hgb A1c MFr Bld  Date/Time Value Ref Range Status  01/19/2022 01:17 PM 5.2 4.8 - 5.6 % Final    Comment:    (NOTE) Pre diabetes:          5.7%-6.4%  Diabetes:              >6.4%  Glycemic control for   <7.0% adults with diabetes   09/19/2016 04:07 AM 5.5 4.8 - 5.6 % Final    Comment:    (NOTE)         Pre-diabetes: 5.7 - 6.4         Diabetes: >6.4         Glycemic control for adults with diabetes: <7.0     CBG: Recent Labs  Lab 01/18/22 1522 01/18/22 1913 01/18/22 2332 01/19/22 0321 01/19/22 0718  GLUCAP 179* 163* 154* 139* 156*    Review of Systems:   As in HPI  Past Medical History:  He,  has a past medical history of  Anemia, BPH (benign prostatic hyperplasia), CAD (coronary artery disease), Carotid artery stenosis, CKD (chronic kidney disease), stage III ([place]), Diastolic dysfunction, Glaucoma, Graves disease, History of ETOH abuse, Hyperlipemia, Hypertension, Hyperthyroidism (08/26/10), Memory loss, Mitral regurgitation (echo 2015), Multiple thyroid nodules, MVP (mitral valve prolapse) (11/2012), OSA (obstructive sleep apnea), Pre-diabetes, PUD (peptic ulcer disease), Pulmonary hypertension ([place]) (echo 2015), and Upper airway resistance syndrome.   Surgical History:   Past Surgical History:  Procedure Laterality Date   APPENDECTOMY     AV FISTULA PLACEMENT Left 10/18/2017   Procedure: ARTERIOVENOUS (AV) FISTULA CREATION ARM;  Surgeon: Waynetta Sandy, MD;  Location: [facility];  Service: Vascular;  Laterality: Left;   CARDIAC CATHETERIZATION     CARDIAC CATHETERIZATION N/A 02/17/2015   Procedure: Right Heart Cath;  Surgeon: Larey Dresser, MD;  Location: [facility];  Service: Cardiovascular;  Laterality: N/A;   CARDIOVERSION N/A 02/08/2019   Procedure: CARDIOVERSION;  Surgeon: Donato Heinz, MD;  Location: [facility];  Service: Endoscopy;  Laterality: N/A;   ESOPHAGOGASTRODUODENOSCOPY (EGD) WITH PROPOFOL Left 10/16/2016   Procedure: ESOPHAGOGASTRODUODENOSCOPY (EGD) WITH PROPOFOL;  Surgeon: Ronnette Juniper, MD;  Location: [facility];  Service: Gastroenterology;  Laterality: Left;   heart catherization     HERNIA REPAIR  10/2009   IR RADIOLOGIST EVAL & MGMT  09/28/2016   IR RADIOLOGIST  EVAL & MGMT  10/19/2016   IR RADIOLOGIST EVAL & MGMT  01/18/2017   PACEMAKER IMPLANT N/A 02/09/2019   Procedure: PACEMAKER IMPLANT;  Surgeon: Constance Haw, MD;  Location: [facility];  Service: Cardiovascular;  Laterality: N/A;   RIGHT HEART CATH N/A 07/28/2016   Procedure: Right Heart Cath;  Surgeon: Larey Dresser, MD;  Location: [facility];  Service: Cardiovascular;  Laterality: N/A;      Social History:   reports that he quit smoking about 37 years ago. His smoking use included cigarettes. He has a 80.00 pack-year smoking history. He has never used smokeless tobacco. He reports that he does not drink alcohol and does not use drugs.   Family History:  His family history includes Colon cancer in his brother, maternal uncle, and mother; Hypertension in his father; Kidney failure in his father; Lung disease in his brother.   Allergies Allergies  Allergen Reactions   Cardura [Doxazosin] Shortness Of Breath and Other (See Comments)    Dizziness    Hydrochlorothiazide Shortness Of Breath and Other (See Comments)    Dizziness    Iodinated Contrast Media Other (See Comments)    "shut down his kidneys" Patient has stage III chronic kidney disease   Zocor [Simvastatin] Other (See Comments)    Kidney problems    Protonix [Pantoprazole] Other (See Comments)    Worsening kidney problems   Rocaltrol [Calcitriol] Other (See Comments)    Unknown reaction   Budesonide-Formoterol Fumarate Other (See Comments)    Dry mouth   Minodyl [Minoxidil] Other (See Comments)    Unknown raction   Tiotropium Bromide Monohydrate Other (See Comments)    Dry mouth   Tricor [Fenofibrate]     Unknown reaction     Home Medications  Prior to Admission medications   Medication Sig Start Date End Date Taking? Authorizing Provider  atorvastatin (LIPITOR) 20 MG tablet TAKE 1 BY MOUTH DAILY ABSOLUTE LAST REFILL WITHOUT OFFICE VISIT PLEASE CALL [phone] TO SCHEDULE Patient taking differently: Take 20 mg by mouth daily. 11/09/21  Yes Larey Dresser, MD  Brinzolamide-Brimonidine [facility]) 1-0.2 % SUSP Place 1 drop into both eyes 2 (two) times daily.    Yes [provider]  calcium acetate (PHOSLO) 667 MG capsule Take 667 mg by mouth See admin instructions. 667 mg 3 times daily with meals, 667 mg 2 times daily with snacks. 11/30/19  Yes [provider]  donepezil (ARICEPT) 10 MG  tablet Take 1 tablet (10 mg total) by mouth at bedtime. 08/17/21  Yes Suzzanne Cloud, NP  doxazosin (CARDURA) 4 MG tablet Take 1 tablet (4 mg total) by mouth daily. Patient taking differently: Take 4 mg by mouth 2 (two) times daily. 02/12/19  Yes Simmons, Brittainy M, PA-C  levothyroxine (SYNTHROID, LEVOTHROID) 150 MCG tablet Take 150 mcg by mouth daily before breakfast.   Yes [provider]  memantine (NAMENDA) 10 MG tablet TAKE 1 TABLET BY MOUTH TWICE A DAY Patient taking differently: Take 10 mg by mouth 2 (two) times daily. 01/19/21  Yes Suzzanne Cloud, NP  multivitamin (RENA-VIT) TABS tablet Take 1 tablet by mouth daily. 11/01/19  Yes [provider]     Critical care time: 34

## 2022-01-19 NOTE — Evaluation (Signed)
Occupational Therapy Evaluation Patient Details Name: Jeffrey Campbell MRN: [id number] DOB: April 24, 1932 Today's Date: 01/19/2022   History of Present Illness 88 y.o. male admitted 10/17 with AMS, hypotensive.  VDRF 10/17-10/18.  10/21 PCCM reconsulted for complete whiteout of left lung, transferred to ICU and intubated, brief PEA arrest with ROSC in 4 minutes. 10/22 CT with acute Lt cerebellar infarct.  PMHx: HTN, HLD, T2DM, hypothyroidism, COPD, OSA, CHF, PAD, CAD with complete heart block s/p PPM, mitral prolapse with mitral regurgitation, ESRD on HD MWF   Clinical Impression   Pt presents with deficits in cognition, strength, endurance and balance. Pt with flat affect, inconsistent following of commands with BUE appearing weaker than BLE. Pt able to maintain sitting balance EOB with min guard to Min A, required Max A x 2 for standing and side steps at bedside with multimodal cues required. Based on current deficits, recommend SNF rehab at DC.  VSS on PRVC, 40% FiO2 ETT PEEP 5      Recommendations for follow up therapy are one component of a multi-disciplinary discharge planning process, led by the attending physician.  Recommendations may be updated based on patient status, additional functional criteria and insurance authorization.   Follow Up Recommendations  Skilled nursing-short term rehab (<3 hours/day)    Assistance Recommended at Discharge Frequent or constant Supervision/Assistance  Patient can return home with the following Two people to help with walking and/or transfers;Two people to help with bathing/dressing/bathroom    Functional Status Assessment  Patient has had a recent decline in their functional status and demonstrates the ability to make significant improvements in function in a reasonable and predictable amount of time.  Equipment Recommendations  Other (comment) (TBD pending progress)    Recommendations for Other Services       Precautions / Restrictions  Precautions Precautions: Fall Precaution Comments: flexi seal, vent, ETT, cortrak Restrictions Weight Bearing Restrictions: No      Mobility Bed Mobility Overal bed mobility: Needs Assistance Bed Mobility: Rolling, Sidelying to Sit, Supine to Sit Rolling: Max assist     Sit to supine: Max assist, +2 for physical assistance   General bed mobility comments: max assist to rotate pelvis and elevate trunk from surface. Max to clear legs and trunk into sitting with +2 for safety. Return to bed with max +2 for physical assist, lines and safety    Transfers Overall transfer level: Needs assistance   Transfers: Sit to/from Stand Sit to Stand: Max assist, +2 physical assistance           General transfer comment: max +2 with bil knees blocked, use of pad and physical assist to weight shift and advance feet to side step toward HOB. EOb 9 min with min assist, 1 min with minguard and cues for posture      Balance Overall balance assessment: Needs assistance Sitting-balance support: Bilateral upper extremity supported, Feet supported, Single extremity supported, No upper extremity supported Sitting balance-Leahy Scale: Poor Sitting balance - Comments: minguard 1 min with majority of EOB 9 min at min assist   Standing balance support: Bilateral upper extremity supported, During functional activity Standing balance-Leahy Scale: Zero Standing balance comment: bil UE support with knees blocked and physical assist                           ADL either performed or assessed with clinical judgement   ADL Overall ADL's : Needs assistance/impaired  General ADL Comments: Overall Total A for all ADLs d/t decreased command following and significant weakness     Vision Ability to See in Adequate Light: 2 Moderately impaired Patient Visual Report: Other (comment) (to be further assessed; R gaze preference) Vision Assessment?:  Vision impaired- to be further tested in functional context Additional Comments: R gaze preference     Perception     Praxis      Pertinent Vitals/Pain Pain Assessment Pain Assessment: Faces Faces Pain Scale: Hurts a little bit Pain Location: generalized with movement Pain Descriptors / Indicators: Grimacing Pain Intervention(s): Monitored during session     Hand Dominance Right   Extremity/Trunk Assessment Upper Extremity Assessment Upper Extremity Assessment: RUE deficits/detail;LUE deficits/detail RUE Deficits / Details: edema around elbow/forearm. able to wiggle digits and maintain weak grasp. unable to lift arm, trace bicep activation noted LUE Deficits / Details: similar to LUE   Lower Extremity Assessment Lower Extremity Assessment: Defer to PT evaluation   Cervical / Trunk Assessment Cervical / Trunk Assessment: Kyphotic   Communication Communication Communication: HOH   Cognition Arousal/Alertness: Awake/alert Behavior During Therapy: Flat affect Overall Cognitive Status: Impaired/Different from baseline Area of Impairment: Following commands, Attention                   Current Attention Level: Focused   Following Commands: Follows one step commands inconsistently, Follows one step commands with increased time       General Comments: pt with decreased command following, right gaze preference, limited ability to assess cognition with ETT     General Comments       Exercises     Shoulder Instructions      Home Living Family/patient expects to be discharged to:: Private residence Living Arrangements: Children;Other (Comment) Available Help at Discharge: Family;Available PRN/intermittently Type of Home: House Home Access: Level entry     Home Layout: One level     Bathroom Shower/Tub: Teacher, early years/pre: Standard     Home Equipment: Cane - single point;Shower seat   Additional Comments: Daughter stays part time and  other time he has aides that come occasionally per notes. Unsure of home information as pt unable to report      Prior Functioning/Environment Prior Level of Function : Patient poor historian/Family not available             Mobility Comments: unable to report; information obtained from initial PT eval and chart          OT Problem List: Decreased strength;Decreased activity tolerance;Impaired balance (sitting and/or standing);Decreased coordination;Decreased cognition;Decreased safety awareness;Impaired vision/perception;Impaired UE functional use;Increased edema      OT Treatment/Interventions: Self-care/ADL training;Therapeutic exercise;Energy conservation;DME and/or AE instruction;Therapeutic activities;Patient/family education;Balance training    OT Goals(Current goals can be found in the care plan section) Acute Rehab OT Goals Patient Stated Goal: unable to state OT Goal Formulation: Patient unable to participate in goal setting Time For Goal Achievement: 02/02/22 Potential to Achieve Goals: Good  OT Frequency: Min 2X/week    Co-evaluation PT/OT/SLP Co-Evaluation/Treatment: Yes Reason for Co-Treatment: For patient/therapist safety;To address functional/ADL transfers;Necessary to address cognition/behavior during functional activity;Complexity of the patient's impairments (multi-system involvement) PT goals addressed during session: Mobility/safety with mobility;Balance;Strengthening/ROM OT goals addressed during session: ADL's and self-care;Strengthening/ROM      AM-PAC OT "6 Clicks" Daily Activity     Outcome Measure Help from another person eating meals?: Total Help from another person taking care of personal grooming?: Total Help from another person toileting, which includes  using toliet, bedpan, or urinal?: Total Help from another person bathing (including washing, rinsing, drying)?: Total Help from another person to put on and taking off regular upper body  clothing?: Total Help from another person to put on and taking off regular lower body clothing?: Total 6 Click Score: 6   End of Session Nurse Communication: Mobility status  Activity Tolerance: Patient tolerated treatment well;Patient limited by fatigue Patient left: in bed;with call bell/phone within reach;with bed alarm set  OT Visit Diagnosis: Other abnormalities of gait and mobility (R26.89);Unsteadiness on feet (R26.81);Muscle weakness (generalized) (M62.81);Other symptoms and signs involving cognitive function                Time: 6578-4696 OT Time Calculation (min): 29 min Charges:  OT General Charges $OT Visit: 1 Visit OT Evaluation $OT Eval High Complexity: 1 High  Malachy Chamber, OTR/L Acute Rehab Services Office: [phone]   Layla Maw 01/19/2022, 10:52 AM

## 2022-01-19 NOTE — Progress Notes (Addendum)
Physical Therapy Treatment Patient Details Name: Jeffrey Campbell MRN: [id number] DOB: 17-Aug-1932 Today's Date: 01/19/2022   History of Present Illness 88 y.o. male admitted 10/17 with AMS, hypotensive.  VDRF 10/17-10/18.  10/21 PCCM reconsulted for complete whiteout of left lung, transferred to ICU and intubated, brief PEA arrest with ROSC in 4 minutes. 10/22 CT with acute Lt cerebellar infarct.  PMHx: HTN, HLD, T2DM, hypothyroidism, COPD, OSA, CHF, PAD, CAD with complete heart block s/p PPM, mitral prolapse with mitral regurgitation, ESRD on HD MWF    PT Comments    Pt with flat affect on vent with ETT 25cm, PRVC 40% and peep 5. Pt tolerating sitting EOB 9 min with minguard-min assist, able to actively participate in bil LE movement and standing trials. Poor balance with max +2 assist for transfers and standing. D/C plan remains appropriate and will continue to follow to maximize function.     Recommendations for follow up therapy are one component of a multi-disciplinary discharge planning process, led by the attending physician.  Recommendations may be updated based on patient status, additional functional criteria and insurance authorization.  Follow Up Recommendations  Skilled nursing-short term rehab (<3 hours/day) Can patient physically be transported by private vehicle: No   Assistance Recommended at Discharge Frequent or constant Supervision/Assistance  Patient can return home with the following A lot of help with bathing/dressing/bathroom;Assistance with cooking/housework;Assist for transportation;Help with stairs or ramp for entrance;Two people to help with walking and/or transfers;Direct supervision/assist for financial management;Direct supervision/assist for medications management   Equipment Recommendations  Wheelchair (measurements PT);Wheelchair cushion (measurements PT);Hospital bed    Recommendations for Other Services       Precautions / Restrictions  Precautions Precautions: Fall Precaution Comments: flexi seal, vent, ETT, cortrak     Mobility  Bed Mobility Overal bed mobility: Needs Assistance Bed Mobility: Rolling, Sidelying to Sit Rolling: Max assist Sidelying to sit: Max assist   Sit to supine: Max assist, +2 for physical assistance   General bed mobility comments: max assist to rotate pelvis and elevate trunk from surface. Max to clear legs and trunk into sitting with +2 for safety. Return to bed with max +2 for physical assist, lines and safety    Transfers Overall transfer level: Needs assistance   Transfers: Sit to/from Stand Sit to Stand: Max assist, +2 physical assistance           General transfer comment: max +2 with bil knees blocked, use of pad and physical assist to weight shift and advance feet to side step toward HOB. EOb 9 min with min assist, 1 min with minguard and cues for posture    Ambulation/Gait               General Gait Details: unable   Stairs             Wheelchair Mobility    Modified Rankin (Stroke Patients Only) Modified Rankin (Stroke Patients Only) Pre-Morbid Rankin Score: Moderate disability Modified Rankin: Severe disability     Balance Overall balance assessment: Needs assistance   Sitting balance-Leahy Scale: Poor Sitting balance - Comments: minguard 1 min with majority of EOB 9 min at min assist   Standing balance support: Bilateral upper extremity supported, During functional activity Standing balance-Leahy Scale: Zero Standing balance comment: bil UE support with knees blocked and physical assist                            Cognition Arousal/Alertness:  Awake/alert Behavior During Therapy: Flat affect Overall Cognitive Status: Impaired/Different from baseline Area of Impairment: Following commands, Attention                   Current Attention Level: Focused   Following Commands: Follows one step commands inconsistently, Follows  one step commands with increased time       General Comments: pt with decreased command following, right gaze preference, limited ability to assess cognition with ETT        Exercises General Exercises - Lower Extremity Heel Slides: AAROM, Both, 5 reps, Supine    General Comments        Pertinent Vitals/Pain Pain Assessment Pain Assessment: CPOT Facial Expression: Relaxed, neutral Body Movements: Absence of movements Muscle Tension: Relaxed Compliance with ventilator (intubated pts.): Tolerating ventilator or movement Vocalization (extubated pts.): N/A CPOT Total: 0 Pain Intervention(s): Limited activity within patient's tolerance, Monitored during session, Repositioned    Home Living                          Prior Function            PT Goals (current goals can now be found in the care plan section) Acute Rehab PT Goals Time For Goal Achievement: 02/02/22 Potential to Achieve Goals: Fair Progress towards PT goals: Goals downgraded-see care plan    Frequency    Min 3X/week      PT Plan Current plan remains appropriate    Co-evaluation PT/OT/SLP Co-Evaluation/Treatment: Yes Reason for Co-Treatment: Complexity of the patient's impairments (multi-system involvement);For patient/therapist safety PT goals addressed during session: Mobility/safety with mobility;Balance;Strengthening/ROM        AM-PAC PT "6 Clicks" Mobility   Outcome Measure  Help needed turning from your back to your side while in a flat bed without using bedrails?: Total Help needed moving from lying on your back to sitting on the side of a flat bed without using bedrails?: Total Help needed moving to and from a bed to a chair (including a wheelchair)?: Total Help needed standing up from a chair using your arms (e.g., wheelchair or bedside chair)?: Total Help needed to walk in hospital room?: Total Help needed climbing 3-5 steps with a railing? : Total 6 Click Score: 6    End  of Session   Activity Tolerance: Patient tolerated treatment well Patient left: in bed;with call bell/phone within reach;with bed alarm set Nurse Communication: Mobility status;Need for lift equipment PT Visit Diagnosis: Muscle weakness (generalized) (M62.81);Other abnormalities of gait and mobility (R26.89);Difficulty in walking, not elsewhere classified (R26.2)     Time: 1448-1856 PT Time Calculation (min) (ACUTE ONLY): 28 min  Charges:  $Therapeutic Activity: 8-22 mins                     Bayard Males, PT Acute Rehabilitation Services Office: [phone]    Sandy Salaam Detta Mellin 01/19/2022, 10:17 AM

## 2022-01-19 NOTE — Progress Notes (Addendum)
STROKE TEAM PROGRESS NOTE   INTERVAL HISTORY Patient is seen in his room with no family at the bedside. He was originally admitted with pneumococcus pneumonia, was intubated on 10/17 and extubated on 10/18, then developed respiratory distress on 10/21 and was reintubated.  He then had a brief PEA arrest with about 4 minutes of downtime before ROSC was achieved.  A head CT was repeated on 10/22, showing a small stroke in the left cerebellum.  Patient has been in atrial fibrillation but was not anticoagulated due to fall risk. Patient remains sedated and intubated for respiratory failure.  He however can be aroused and follows commands and moves all 4 extremities purposefully.  Vital signs are stable.  CT scan shows small left inferior cerebellar infarct. Vitals:   01/19/22 0731 01/19/22 0800 01/19/22 0900 01/19/22 1000  BP:  (!) 135/59 (!) 120/56 109/61  Pulse:  70 69 70  Resp:  '16 18 19  '$ Temp:      TempSrc:      SpO2: 98% 98% 100% 100%  Weight:   59.1 kg   Height:   '5\' 9"'$  (1.753 m)    CBC:  Recent Labs  Lab 01/18/22 0714 01/19/22 0316  WBC 8.6 14.0*  NEUTROABS 6.7 11.0*  HGB 8.8* 10.0*  HCT 27.1* 31.0*  MCV 101.1* 100.6*  PLT 89* 073*   Basic Metabolic Panel:  Recent Labs  Lab 01/14/22 0453 01/15/22 0445 01/16/22 1309 01/18/22 0714 01/19/22 0316  NA 138 138   < > 141 141  K 3.3* 3.2*   < > 3.9 3.8  CL 96* 96*   < > 103 96*  CO2 26 24   < > 26 28  GLUCOSE 101* 111*   < > 190* 158*  BUN 23 35*   < > 58* 38*  CREATININE 3.31* 4.55*   < > 5.73* 3.77*  CALCIUM 9.0 9.3   < > 9.9 9.7  MG 1.6* 2.2  --   --   --   PHOS 3.8 3.5  3.5   < > 1.6* 2.6   < > = values in this interval not displayed.   Lipid Panel:  Recent Labs  Lab 01/19/22 0316  CHOL 85  TRIG 50  HDL 34*  CHOLHDL 2.5  VLDL 10  LDLCALC 41   HgbA1c:  Recent Labs  Lab 01/07/2022 1317  HGBA1C 5.2   Urine Drug Screen: No results for input(s): "LABOPIA", "COCAINSCRNUR", "LABBENZ", "AMPHETMU", "THCU",  "LABBARB" in the last 168 hours.  Alcohol Level No results for input(s): "ETH" in the last 168 hours.  IMAGING past 24 hours DG CHEST PORT 1 VIEW  Result Date: 01/19/2022 CLINICAL DATA:  Ventilator dependence. EXAM: PORTABLE CHEST 1 VIEW COMPARISON:  01/17/2022 FINDINGS: 0823 hours. Endotracheal tube tip is 3.7 cm above the base of the carina. A feeding tube passes into the stomach although the distal tip position is not included on the film. The cardio pericardial silhouette is enlarged. Right base collapse/consolidation is progressive in the interval with new 3 cm nodular component on today's exam. There is persistent retrocardiac left base collapse/consolidation with tiny left effusion. Interstitial markings are diffusely coarsened with chronic features. Telemetry leads overlie the chest. IMPRESSION: 1. Interval progression of right base collapse/consolidation with new 3 cm nodular component on today's exam. Close follow-up recommended. 2. Persistent retrocardiac left base collapse/consolidation with tiny left effusion. Electronically Signed   By: Misty Stanley M.D.   On: 01/19/2022 08:33    PHYSICAL EXAM General:  Intubated frail elderly Asian patient in no acute distress. . Afebrile. Head is nontraumatic. Neck is supple without bruit.    Cardiac exam no murmur or gallop. Lungs are clear to auscultation. Distal pulses are well felt.  Respiratory: respiration synchronous with ventilator Neurological:Exam : Patient is intubated and sedated but can be aroused and opens eyes partially.  PERRL, EOMI, blinks to threat on both sides, able to follow commands with all extremities and able to move them purposefully against gravity.  No focal weakness.  ASSESSMENT/PLAN [name] is a 88 y.o. male with history of HTN, HLD, atrial fibrillation not on anticoagulation, SDH, CHF, COPD, OSA, PAD, CAN and ESRD on HD who was originally admitted with pneumococcus pneumonia, was intubated on 10/17 and  extubated on 10/18, then developed respiratory distress on 10/21 and was reintubated.  He then had a brief PEA arrest with about 4 minutes of downtime before ROSC was achieved.  A head CT was repeated on 10/22, showing a small stroke in the left cerebellum.  Patient has been in atrial fibrillation but was not anticoagulated due to fall risk.  Stroke:  left cerebellar stroke Etiology:  cardioembolic in setting of a-fib not on anticoagulation  CT head No acute abnormality. Small vessel disease. Atrophy. Small acute infarct in inferior left cerebellum MRI  pending MRA  pending Carotid Doppler  pending 2D Echo EF 45-50%. Grade 3 diastolic dysfunction, severely dilated left atrium, mitral and tricuspid regurgitation, no atrial level shunt LDL 41 HgbA1c 5.2 VTE prophylaxis - fully anticoagulated with heparin    Diet   Diet NPO time specified   No antithrombotic prior to admission, now on heparin IV for A-fib but not sure if he is a good long-term anticoagulation candidate Therapy recommendations:  pending Disposition:  pending  Atrial fibrillation Patient has history of a-fib, not anticoagulated due to fall risk and prior SDH Anticoagulate now with heparin IV Consider long term anticoagulation on discharge  Respiratory failure and pneumonia Patient reintubated 10/21 Ventilator management per CCM Antibiotics per CCM  Hypertension Home meds:  doxazosin 4 mg daily Stable Permissive hypertension (OK if < 220/120) but gradually normalize in 5-7 days Long-term BP goal normotensive  Hyperlipidemia Home meds:  atorvastatin 20 mg daily, resumed in hospital LDL 41, goal < 70 High intensity statin not indicated as LDL below goal Continue statin at discharge   Other Stroke Risk Factors Advanced Age >/= 44  Former cigarette smoker Congestive heart failure  Other Active Problems Recent PEA arrest No evidence of hypoxic brain in jury seen on [facility] day # [place] , MSN, AGACNP-BC [facility] See Amion for schedule and pager information 01/19/2022 10:59 AM   STROKE MD NOTE :  Patient was admitted for sepsis with pneumococcus pneumonia and required reintubation with brief PEA cardiac arrest.  CT scan shows small inferior left cerebellar embolic infarct likely from A-fib not on anticoagulation.  Neurological exam is limited due to sedation but appears to be nonfocal and is following commands.  Continue ventilatory support for respiratory failure and treatment of pneumonia as per critical care team.  Minimize sedation and wean off ventilatory support as tolerated.  Continue IV heparin short-term anticoagulation for his A-fib but will need to make decisions after careful discussion with family and weighing risk benefits of long-term anticoagulation before switching to oral anticoagulation in the future.  Continue ongoing stroke work-up and aggressive risk factor modification.  Long discussion with patient as  well as with Dr.Olalere critical care MD.This patient is critically ill and at significant risk of neurological worsening, death and care requires constant monitoring of vital signs, hemodynamics,respiratory and cardiac monitoring, extensive review of multiple databases, frequent neurological assessment, discussion with family, other specialists and medical decision making of high complexity.I have made any additions or clarifications directly to the above note.This critical care time does not reflect procedure time, or teaching time or supervisory time of PA/NP/Med Resident etc but could involve care discussion time.  I spent 30 minutes of neurocritical care time  in the care of  this patient.  Antony Contras MD   To contact Stroke Continuity provider, please refer to [email or web address] After hours, contact General Neurology

## 2022-01-19 NOTE — Consult Note (Signed)
   [facility] CM Inpatient Consult   01/19/2022  Jeffrey Campbell 30-Oct-1932 [id number]  [facility] [ACO] Patient: Medicare ACO REACH  Primary Care Provider:  Lawerance Cruel, MD   Patient screened for length of stay hospitalization with noted high risk score for unplanned readmission risk.  Review of patient's medical record reveals patient remains at ICU level of care.  Encounters show patient has been outreached by [facility] RNCM in the past.   Plan:  Continue to follow progress and disposition to assess for post hospital care management needs.    For questions contact:   Natividad Brood, RN BSN [place]  [phone] business mobile phone Toll free office [phone]  *[place]  [phone] Fax number: [phone] [email or web address]

## 2022-01-19 NOTE — Progress Notes (Signed)
[place] for Heparin Indication: atrial fibrillation  Allergies  Allergen Reactions   Cardura [Doxazosin] Shortness Of Breath and Other (See Comments)    Dizziness    Hydrochlorothiazide Shortness Of Breath and Other (See Comments)    Dizziness    Iodinated Contrast Media Other (See Comments)    "shut down his kidneys" Patient has stage III chronic kidney disease   Zocor [Simvastatin] Other (See Comments)    Kidney problems    Protonix [Pantoprazole] Other (See Comments)    Worsening kidney problems   Rocaltrol [Calcitriol] Other (See Comments)    Unknown reaction   Budesonide-Formoterol Fumarate Other (See Comments)    Dry mouth   Minodyl [Minoxidil] Other (See Comments)    Unknown raction   Tiotropium Bromide Monohydrate Other (See Comments)    Dry mouth   Tricor [Fenofibrate]     Unknown reaction    Patient Measurements: Height: '5\' 9"'$  (175.3 cm) Weight: 59.1 kg (130 lb 4.7 oz) IBW/kg (Calculated) : 70.7 Heparin Dosing Weight: 59 kg   Vital Signs: Temp: 99 F (37.2 C) (10/24 1934) Temp Source: Axillary (10/24 1934) BP: 127/62 (10/24 2000) Pulse Rate: 70 (10/24 2000)  Labs: Recent Labs    01/17/22 0331 01/18/22 0714 01/19/22 0316 01/19/22 1946  HGB  --  8.8* 10.0*  --   HCT  --  27.1* 31.0*  --   PLT  --  89* 102*  --   HEPARINUNFRC  --   --   --  <0.10*  CREATININE 4.58* 5.73* 3.77*  --      Estimated Creatinine Clearance: 11.8 mL/min (A) (by C-G formula based on SCr of 3.77 mg/dL (H)).   Medical History: Past Medical History:  Diagnosis Date   Anemia    low iron   BPH (benign prostatic hyperplasia)    CAD (coronary artery disease)    Carotid artery stenosis    1-39% bilateral carotid artery stenosis and < 50% stenosis in the right CCA by dopplers 06/2017   CKD (chronic kidney disease), stage III (HCC)    Stage 4   Diastolic dysfunction    Glaucoma    Graves disease    History of ETOH abuse     Hyperlipemia    Hypertension    Hyperthyroidism 08/26/10   radioactive iodine therapy    Memory loss    Mitral regurgitation echo 2015   mild   Multiple thyroid nodules    MVP (mitral valve prolapse) 11/2012   posterior MVP   OSA (obstructive sleep apnea)    upper airway resistance syndrome with RDI 18/hr - not on CPAP due to insurance not covering   Pre-diabetes    PUD (peptic ulcer disease)    Pulmonary hypertension ([place]) echo 2015   Group 2 with pulmonary venous HTN and Group 3 with OSA   Upper airway resistance syndrome     Assessment: 88 yo male admitted 01/08/2022 found to have strep pneumonia bacteremia and PNA. Now found to have small infarct in L cerebellum. Pharmacy consulted for IV heparin. No bolus, low heparin level goal given CVA.  Heparin level on 550 units/hr was undetectable at <0.1 units/mL. No signs of bleeding reported.  Goal of Therapy:  Heparin level 0.3-0.5 units/ml Monitor platelets by anticoagulation protocol: Yes   Plan:  Increase heparin to 700 units/hr - no bolus given CVA Check 8hr heparin level at 0500 on 10/25 Monitor heparin level, CBC and s/s of bleeding daily  Watch plts closely  Erskine Speed, PharmD Clinical Pharmacist 01/19/2022 8:38 PM

## 2022-01-20 ENCOUNTER — Inpatient Hospital Stay (HOSPITAL_COMMUNITY): Payer: Medicare Other

## 2022-01-20 DIAGNOSIS — G9340 Encephalopathy, unspecified: Secondary | ICD-10-CM | POA: Diagnosis not present

## 2022-01-20 LAB — RENAL FUNCTION PANEL
Albumin: 2.1 g/dL — ABNORMAL LOW (ref 3.5–5.0)
Anion gap: 12 (ref 5–15)
BUN: 63 mg/dL — ABNORMAL HIGH (ref 8–23)
CO2: 26 mmol/L (ref 22–32)
Calcium: 9.9 mg/dL (ref 8.9–10.3)
Chloride: 98 mmol/L (ref 98–111)
Creatinine, Ser: 5.11 mg/dL — ABNORMAL HIGH (ref 0.61–1.24)
GFR, Estimated: 10 mL/min — ABNORMAL LOW (ref >60)
Glucose, Bld: 254 mg/dL — ABNORMAL HIGH (ref 70–99)
Phosphorus: 3.4 mg/dL (ref 2.5–4.6)
Potassium: 4.2 mmol/L (ref 3.5–5.1)
Sodium: 136 mmol/L (ref 135–145)

## 2022-01-20 LAB — HEPARIN LEVEL (UNFRACTIONATED)
Heparin Unfractionated: <0.1 IU/mL — ABNORMAL LOW (ref 0.30–0.70)
Heparin Unfractionated: <0.1 IU/mL — ABNORMAL LOW (ref 0.30–0.70)

## 2022-01-20 LAB — GLUCOSE, CAPILLARY
Glucose-Capillary: 176 mg/dL — ABNORMAL HIGH (ref 70–99)
Glucose-Capillary: 195 mg/dL — ABNORMAL HIGH (ref 70–99)
Glucose-Capillary: 183 mg/dL — ABNORMAL HIGH (ref 70–99)
Glucose-Capillary: 224 mg/dL — ABNORMAL HIGH (ref 70–99)
Glucose-Capillary: 146 mg/dL — ABNORMAL HIGH (ref 70–99)
Glucose-Capillary: 207 mg/dL — ABNORMAL HIGH (ref 70–99)

## 2022-01-20 LAB — CBC
HCT: 27.7 % — ABNORMAL LOW (ref 39.0–52.0)
Hemoglobin: 9.4 g/dL — ABNORMAL LOW (ref 13.0–17.0)
MCH: 33.3 pg (ref 26.0–34.0)
MCHC: 33.9 g/dL (ref 30.0–36.0)
MCV: 98.2 fL (ref 80.0–100.0)
Platelets: 128 10*3/uL — ABNORMAL LOW (ref 150–400)
RBC: 2.82 MIL/uL — ABNORMAL LOW (ref 4.22–5.81)
RDW: 15.4 % (ref 11.5–15.5)
WBC: 12.3 10*3/uL — ABNORMAL HIGH (ref 4.0–10.5)
nRBC: 0 % (ref 0.0–0.2)

## 2022-01-20 MED ORDER — MIDAZOLAM HCL 2 MG/2ML IJ SOLN
INTRAMUSCULAR | Status: AC
  Administered 2022-01-20: 1 mg
  Filled 2022-01-20: qty 2

## 2022-01-20 MED ORDER — FENTANYL CITRATE PF 50 MCG/ML IJ SOSY
50.0000 ug | PREFILLED_SYRINGE | Freq: Once | INTRAMUSCULAR | Status: AC
  Administered 2022-01-20: 50 ug via INTRAVENOUS
  Filled 2022-01-20: qty 1

## 2022-01-20 MED ORDER — LIDOCAINE HCL (PF) 1 % IJ SOLN
5.0000 mL | Freq: Once | INTRAMUSCULAR | Status: AC
  Administered 2022-01-20: 5 mL
  Filled 2022-01-20: qty 5

## 2022-01-20 MED ORDER — ALBUMIN HUMAN 25 % IV SOLN
25.0000 g | INTRAVENOUS | Status: AC | PRN
  Administered 2022-01-20 (×2): 25 g via INTRAVENOUS
  Filled 2022-01-20 (×2): qty 100

## 2022-01-20 NOTE — Progress Notes (Signed)
CPT held at this time as pt continues to have HD at the bedside

## 2022-01-20 NOTE — Procedures (Signed)
Bronchoscopy Procedure Note  Jeffrey Campbell  [id number]  12-Aug-1932  Date:01/20/22  Time:1:54 PM   Provider Performing:Aleera Gilcrease A Redell Bhandari   Procedure(s):  Flexible Bronchoscopy (88110)  Indication(s) Atelectasis, pulmonary toiletting  Consent Risks of the procedure as well as the alternatives and risks of each were explained to the patient and/or caregiver.  Consent for the procedure was obtained and is signed in the bedside chart  Anesthesia Fentanyl, versed   Time Out Verified patient identification, verified procedure, site/side was marked, verified correct patient position, special equipment/implants available, medications/allergies/relevant history reviewed, required imaging and test results available.   Sterile Technique Usual hand hygiene, masks, gowns, and gloves were used   Procedure Description Bronchoscope advanced through endotracheal tube and into airway.  Airways were examined down to subsegmental level with findings noted below.   Thick mucoid secretions found in bilateral airways, bronchoscopy to be removed from airway couple of times to expel mucous plugs.  No significant airway irritation bilaterally  Following diagnostic evaluation, BAL(s) performed in right lower lobe with normal saline and return of 30 fluid  Findings: thick mucoid secretions plugging up airways bilaterally   Complications/Tolerance None; patient tolerated the procedure well. Chest X-ray is not needed post procedure.   EBL none   Specimen(s) Bal RLL

## 2022-01-20 NOTE — Progress Notes (Signed)
[place] for Heparin Indication: atrial fibrillation  Allergies  Allergen Reactions   Cardura [Doxazosin] Shortness Of Breath and Other (See Comments)    Dizziness    Hydrochlorothiazide Shortness Of Breath and Other (See Comments)    Dizziness    Iodinated Contrast Media Other (See Comments)    "shut down his kidneys" Patient has stage III chronic kidney disease   Zocor [Simvastatin] Other (See Comments)    Kidney problems    Protonix [Pantoprazole] Other (See Comments)    Worsening kidney problems   Rocaltrol [Calcitriol] Other (See Comments)    Unknown reaction   Budesonide-Formoterol Fumarate Other (See Comments)    Dry mouth   Minodyl [Minoxidil] Other (See Comments)    Unknown raction   Tiotropium Bromide Monohydrate Other (See Comments)    Dry mouth   Tricor [Fenofibrate]     Unknown reaction    Patient Measurements: Height: '5\' 9"'$  (175.3 cm) Weight: 54.8 kg (120 lb 13 oz) (wt is w/ 2 extra pillows, 1 under each flank) IBW/kg (Calculated) : 70.7 Heparin Dosing Weight: 59 kg   Vital Signs: Temp: 98.4 F (36.9 C) (10/25 0730) Temp Source: Oral (10/25 0730) BP: 94/57 (10/25 0930) Pulse Rate: 70 (10/25 0930)  Labs: Recent Labs    01/18/22 0714 01/19/22 0316 01/19/22 1946 01/20/22 0813  HGB 8.8* 10.0*  --  9.4*  HCT 27.1* 31.0*  --  27.7*  PLT 89* 102*  --  128*  HEPARINUNFRC  --   --  <0.10* <0.10*  CREATININE 5.73* 3.77*  --  5.11*    Estimated Creatinine Clearance: 8 mL/min (A) (by C-G formula based on SCr of 5.11 mg/dL (H)).  Assessment: 88 yo male admitted 01/07/2022 found to have strep pneumonia bacteremia and PNA. Now found to have small infarct in L cerebellum. Pharmacy consulted for IV heparin. No bolus, low heparin level goal given CVA.  Heparin level remains undetectable at < 0.10 after increase to 700 units/hr. No signs of bleeding reported. H/H is low but stable.  Platelets have improved at 102.    Goal of Therapy:  Heparin level 0.3-0.5 units/ml Monitor platelets by anticoagulation protocol: Yes   Plan:  Increase heparin to 850 units/hr - no bolus given CVA Recheck 8hr heparin level  Monitor heparin level, CBC and s/s of bleeding daily  Watch plts closely   Sloan Leiter, PharmD, BCPS, BCCCP Clinical Pharmacist Please refer to [facility] for [place] numbers 01/20/2022 9:42 AM

## 2022-01-20 NOTE — Progress Notes (Signed)
NAME:  Jeffrey Campbell, MRN:  [id number], DOB:  [birth year], LOS: 8 ADMISSION DATE:  01/20/2022, CONSULTATION DATE:  01/10/2022 REFERRING MD:  EDP, CHIEF COMPLAINT:  AMS. ARF   History of Present Illness:  88 year old male with extensive PMH including ESRD on HD MWF. Presents to ED on 10/17 after being found unresponsive at home. Per family patient recent admission 8/23 for altered mental status, UTI. Now with reported tiredness, weakness over last 24 hours. This morning when daughter arrived at patient home, she reports patient was unresponsive making gurgling sounds, on arrival to ED patient remains minimally responsive with tachypnea and hypotension requiring intubation. CXR with left lower lobe infiltrate. CT head negative. CT C/A/P with extensive bilateral infiltirates and noted left lower lobe infiltrate concerning for aspiration. Given Cefepime/Vancomycin. Critical Care Consulted for admission.   Pertinent  Medical History  HTN, DMII, CAD, CHB h/o PPM, ESRD on HD MWF, COPD,   Significant Hospital Events: Including procedures, antibiotic start and stop dates in addition to other pertinent events   ETT 10/17-10/18 10/20 transferred out of ICU 10/21 PCCM reconsulted for complete whiteout of left lung, transferred to ICU and intubated, brief PEA arrest with ROSC in  4 minutes I/O placed 10/22 CT Head: Small acute infarct in the inferior left cerebellum 10/24 MRI brain: Acute infarct of the left and right cerebellum, right pons as well as multiple scattered infarcts in both cerebral hemisphere 10/24 MRA head: Severe stenosis of L ICA, moderate stenosis of R ICA and moderate stenosis of distal right vertebral artery 10/25 CXR worsening lower lung atelectasis/infiltrate  Interim History / Subjective:  Still having increased secretions overnight More somnolent this AM but follows commands  Objective   Blood pressure 101/77, pulse 69, temperature 98.4 F (36.9 C), temperature source Oral, resp.  rate 18, height _0  (1.753 m), weight 54.8 kg, SpO2 100 %.    Vent Mode: PRVC FiO2 (%):  [40 %] 40 % Set Rate:  [18 bmp] 18 bmp Vt Set:  [560 mL] 560 mL PEEP:  [5 cmH20] 5 cmH20 Plateau Pressure:  [15 cmH20-25 cmH20] 15 cmH20   Intake/Output Summary (Last 24 hours) at 01/20/2022 0928 Last data filed at 01/19/2022 1800 Gross per 24 hour  Intake 436.63 ml  Output --  Net 436.63 ml   Filed Weights   01/19/22 0900 01/20/22 0500 01/20/22 0730  Weight: 59.1 kg 51.4 kg 54.8 kg    Examination: General: Acutely ill elderly man laying in bed on vent. HENT: Vent in place.  [place]/AT. MMM Lungs: Remains on vent.  Rhonchorous lung sounds.  No WOB Cardiovascular: V-Paced-rhythm. No m/r/g.  Abdomen: Soft. NT/ND Extremities: Cool extremities.  Edematous BUEs.  Faint DP pulses. Neuro: Spontaneously opens eyes.  Drowsy but follows commands. Moves all extremities  WBC 12.3, hgb 9.4, plt 128 sCr 5.11, K+ 4.2 CBGs 160s-190s   Resolved Hospital Problem list   Septic shock  Assessment & Plan:  Septic Shock 2/2 Aspiration PNA S/p PEA cardiac arrest postintubation due to hypoxia, ROSC in 4 minutes MAP remained stable off pressors. Leukocytosis improving, remains afebrile. -Continue midodrine -Continue Rocephin for 2 more days to complete 10 days of abx  Acute hypoxic Respiratory Failure 2/2 to Aspiration Pneumonia and left-sided mucous plugging  S/p intubation and bronchoscopy on 10/21. Continues to have increased secretions.  Repeat chest x-ray showed worsening of bilateral effusions and lower lung atelectasis/infiltrate.  Plan for repeat bronchoscopy today. -Repeat bronchoscopy with BAL -Pulm toilet, nebs and chest PT -Abx  as above  Acute Left cerebellum infarct Acute septic encephalopathy  Postanoxic injury Advanced dementia CT Head on 10/22 showed small acute infarct in the inferior left cerebellum. Following commands today.  MRI brain shows multiple infarcts in the cerebellum, pons and  cerebral hemisphere.  MRA head showing severe stenosis of left ICA, moderate right ICA. MRI findings concerning for micro embolism from likely cardiac source in the setting of A-fib and severely dilated left and right atrium -Neurology following, appreciate recs -Continue IV heparin -PT recommending SNF -ASA, statin -[place] conversation with palliative care  ESRD on HD MWF Anemia of Chronic Disease Electrolytes remained stable today.  Plan for HD today -Nephro following, HD today -Avoid nephrotoxic agents   Troponemia Hx of CHB s/p pacemaker Acute systolic CHF Atrial fibrillation TTE showed global hypokinesis with elevated Tropon 4,963. Demand ischemia in the setting of septic shock.  Cardiology following, appreciate recs Continue medical mgmt w/ ASA, statin Tele  Hypothyroidism Continue levothyroxine   DMII CBGs stable in the 160s to 190s. Continue SSI. Goal 140-180   Severe protein calorie malnutrition Continue Tube feeds  Best Practice (right click and "Reselect all SmartList Selections" daily)   Diet/type: tubefeeds and NPO DVT prophylaxis: prophylactic heparin  GI prophylaxis: PPI Lines: N/A Foley:  N/A Code Status:  full code Last date of multidisciplinary goals of care discussion [Pending]  Labs   CBC: Recent Labs  Lab 01/14/22 0453 01/15/22 0445 01/16/22 1309 01/18/22 0714 01/19/22 0316 01/20/22 0813  WBC 7.6 6.2  --  8.6 14.0* 12.3*  NEUTROABS  --   --   --  6.7 11.0*  --   HGB 9.0* 9.1* 9.9* 8.8* 10.0* 9.4*  HCT 27.9* 27.4* 29.0* 27.1* 31.0* 27.7*  MCV 100.4* 100.0  --  101.1* 100.6* 98.2  PLT 82* 78*  --  89* 102* 128*    Basic Metabolic Panel: Recent Labs  Lab 01/13/22 1654 01/14/22 0453 01/15/22 0445 01/16/22 1309 01/17/22 0331 01/18/22 0714 01/19/22 0316 01/20/22 0813  NA  --  138 138 138 138 141 141 136  K  --  3.3* 3.2* 2.8* 3.8 3.9 3.8 4.2  CL  --  96* 96*  --  98 103 96* 98  CO2  --  26 24  --  _0 GLUCOSE  --  101*  111*  --  198* 190* 158* 254*  BUN  --  23 35*  --  38* 58* 38* 63*  CREATININE  --  3.31* 4.55*  --  4.58* 5.73* 3.77* 5.11*  CALCIUM  --  9.0 9.3  --  9.7 9.9 9.7 9.9  MG 1.5* 1.6* 2.2  --   --   --   --   --   PHOS 1.7* 3.8 3.5  3.5  --  2.5 1.6* 2.6 3.4   GFR: Estimated Creatinine Clearance: 8 mL/min (A) (by C-G formula based on SCr of 5.11 mg/dL (H)). Recent Labs  Lab 01/15/22 0445 01/18/22 0714 01/19/22 0316 01/20/22 0813  WBC 6.2 8.6 14.0* 12.3*    Liver Function Tests: Recent Labs  Lab 01/15/22 0445 01/17/22 0331 01/18/22 0714 01/19/22 0316 01/20/22 0813  ALBUMIN 2.5* 2.2* 2.1* 2.2* 2.1*   No results for input(s): "LIPASE", "AMYLASE" in the last 168 hours. No results for input(s): "AMMONIA" in the last 168 hours.  ABG    Component Value Date/Time   PHART 7.497 (H) 01/16/2022 1309   PCO2ART 35.4 01/16/2022 1309   PO2ART 73 (L) 01/16/2022 1309  HCO3 27.5 01/16/2022 1309   TCO2 29 01/16/2022 1309   ACIDBASEDEF 5.0 (H) 07/28/2016 0850   ACIDBASEDEF 5.0 (H) 07/28/2016 0850   O2SAT 96 01/16/2022 1309     Coagulation Profile: No results for input(s): "INR", "PROTIME" in the last 168 hours.   Cardiac Enzymes: No results for input(s): "CKTOTAL", "CKMB", "CKMBINDEX", "TROPONINI" in the last 168 hours.  HbA1C: Hgb A1c MFr Bld  Date/Time Value Ref Range Status  01/20/2022 01:17 PM 5.2 4.8 - 5.6 % Final    Comment:    (NOTE) Pre diabetes:          5.7%-6.4%  Diabetes:              >6.4%  Glycemic control for   <7.0% adults with diabetes   09/19/2016 04:07 AM 5.5 4.8 - 5.6 % Final    Comment:    (NOTE)         Pre-diabetes: 5.7 - 6.4         Diabetes: >6.4         Glycemic control for adults with diabetes: <7.0     CBG: Recent Labs  Lab 01/19/22 1537 01/19/22 1933 01/19/22 2320 01/20/22 0319 01/20/22 0738  GLUCAP 104* 163* 190* 176* 195*    Review of Systems:   As in HPI  Past Medical History:  He,  has a past medical history of  Anemia, BPH (benign prostatic hyperplasia), CAD (coronary artery disease), Carotid artery stenosis, CKD (chronic kidney disease), stage III ([place]), Diastolic dysfunction, Glaucoma, Graves disease, History of ETOH abuse, Hyperlipemia, Hypertension, Hyperthyroidism (08/26/10), Memory loss, Mitral regurgitation (echo 2015), Multiple thyroid nodules, MVP (mitral valve prolapse) (11/2012), OSA (obstructive sleep apnea), Pre-diabetes, PUD (peptic ulcer disease), Pulmonary hypertension ([place]) (echo 2015), and Upper airway resistance syndrome.   Surgical History:   Past Surgical History:  Procedure Laterality Date   APPENDECTOMY     AV FISTULA PLACEMENT Left 10/18/2017   Procedure: ARTERIOVENOUS (AV) FISTULA CREATION ARM;  Surgeon: Waynetta Sandy, MD;  Location: [facility];  Service: Vascular;  Laterality: Left;   CARDIAC CATHETERIZATION     CARDIAC CATHETERIZATION N/A 02/17/2015   Procedure: Right Heart Cath;  Surgeon: Larey Dresser, MD;  Location: [facility];  Service: Cardiovascular;  Laterality: N/A;   CARDIOVERSION N/A 02/08/2019   Procedure: CARDIOVERSION;  Surgeon: Donato Heinz, MD;  Location: [facility];  Service: Endoscopy;  Laterality: N/A;   ESOPHAGOGASTRODUODENOSCOPY (EGD) WITH PROPOFOL Left 10/16/2016   Procedure: ESOPHAGOGASTRODUODENOSCOPY (EGD) WITH PROPOFOL;  Surgeon: Ronnette Juniper, MD;  Location: [facility];  Service: Gastroenterology;  Laterality: Left;   heart catherization     HERNIA REPAIR  10/2009   IR RADIOLOGIST EVAL & MGMT  09/28/2016   IR RADIOLOGIST EVAL & MGMT  10/19/2016   IR RADIOLOGIST EVAL & MGMT  01/18/2017   PACEMAKER IMPLANT N/A 02/09/2019   Procedure: PACEMAKER IMPLANT;  Surgeon: Constance Haw, MD;  Location: [facility];  Service: Cardiovascular;  Laterality: N/A;   RIGHT HEART CATH N/A 07/28/2016   Procedure: Right Heart Cath;  Surgeon: Larey Dresser, MD;  Location: [facility];  Service: Cardiovascular;  Laterality: N/A;      Social History:   reports that he quit smoking about 37 years ago. His smoking use included cigarettes. He has a 80.00 pack-year smoking history. He has never used smokeless tobacco. He reports that he does not drink alcohol and does not use drugs.   Family History:  His family history  includes Colon cancer in his brother, maternal uncle, and mother; Hypertension in his father; Kidney failure in his father; Lung disease in his brother.   Allergies Allergies  Allergen Reactions   Cardura [Doxazosin] Shortness Of Breath and Other (See Comments)    Dizziness    Hydrochlorothiazide Shortness Of Breath and Other (See Comments)    Dizziness    Iodinated Contrast Media Other (See Comments)    "shut down his kidneys" Patient has stage III chronic kidney disease   Zocor [Simvastatin] Other (See Comments)    Kidney problems    Protonix [Pantoprazole] Other (See Comments)    Worsening kidney problems   Rocaltrol [Calcitriol] Other (See Comments)    Unknown reaction   Budesonide-Formoterol Fumarate Other (See Comments)    Dry mouth   Minodyl [Minoxidil] Other (See Comments)    Unknown raction   Tiotropium Bromide Monohydrate Other (See Comments)    Dry mouth   Tricor [Fenofibrate]     Unknown reaction     Home Medications  Prior to Admission medications   Medication Sig Start Date End Date Taking? Authorizing Provider  atorvastatin (LIPITOR) 20 MG tablet TAKE 1 BY MOUTH DAILY ABSOLUTE LAST REFILL WITHOUT OFFICE VISIT PLEASE CALL [phone] TO SCHEDULE Patient taking differently: Take 20 mg by mouth daily. 11/09/21  Yes Larey Dresser, MD  Brinzolamide-Brimonidine [facility]) 1-0.2 % SUSP Place 1 drop into both eyes 2 (two) times daily.    Yes [provider]  calcium acetate (PHOSLO) 667 MG capsule Take 667 mg by mouth See admin instructions. 667 mg 3 times daily with meals, 667 mg 2 times daily with snacks. 11/30/19  Yes [provider]  donepezil (ARICEPT) 10 MG  tablet Take 1 tablet (10 mg total) by mouth at bedtime. 08/17/21  Yes Suzzanne Cloud, NP  doxazosin (CARDURA) 4 MG tablet Take 1 tablet (4 mg total) by mouth daily. Patient taking differently: Take 4 mg by mouth 2 (two) times daily. 02/12/19  Yes Simmons, Brittainy M, PA-C  levothyroxine (SYNTHROID, LEVOTHROID) 150 MCG tablet Take 150 mcg by mouth daily before breakfast.   Yes [provider]  memantine (NAMENDA) 10 MG tablet TAKE 1 TABLET BY MOUTH TWICE A DAY Patient taking differently: Take 10 mg by mouth 2 (two) times daily. 01/19/21  Yes Suzzanne Cloud, NP  multivitamin (RENA-VIT) TABS tablet Take 1 tablet by mouth daily. 11/01/19  Yes [provider]     Critical care time: 37

## 2022-01-20 NOTE — Progress Notes (Signed)
   01/20/22 1215  Vitals  Temp 98.7 F (37.1 C)  Temp Source Oral  BP (!) 116/56  MAP (mmHg) 73  BP Location Right Arm  BP Method Automatic  Patient Position (if appropriate) Lying  Pulse Rate 70  Pulse Rate Source Monitor  ECG Heart Rate 85  Resp 19  Oxygen Therapy  SpO2 99 %  O2 Device Ventilator  FiO2 (%) 40 %  Pulse Oximetry Type Continuous   Received patient in bed to unit.  Alert and oriented.  Informed consent signed and in chart.   Treatment initiated: 0817 (delay in starting tx after I got to room d/t original machine wouldn't pass self text x4 attempts, had to go back to unit to get new machine and re set up from scratch, then as I was about to finally stick, Radiology came to do portable cxray and asked me to stop what I was doing and step outside while they took the cxray) Treatment completed: 1149  Patient tolerated well.  Transported back to the room  Alert, without acute distress.  Hand-off given to patient's nurse.   Access used: AVF Access issues: NA  Total UF removed: 2600 ml Medication(s) given: Albumin 25% 50G IVPB Post HD VS: see above Post HD weight: 57.9kg (wt is greater than pre wt, and this wt was w/o the extra pillows, don't trust this wt)   [facility]

## 2022-01-20 NOTE — Plan of Care (Signed)
  Problem: Education: Goal: Knowledge of disease or condition will improve Outcome: Not Progressing Goal: Knowledge of secondary prevention will improve (MUST DOCUMENT ALL) Outcome: Not Progressing

## 2022-01-20 NOTE — Plan of Care (Signed)
  Problem: Education: Goal: Ability to describe self-care measures that may prevent or decrease complications (Diabetes Survival Skills Education) will improve Outcome: Progressing   Problem: Coping: Goal: Ability to adjust to condition or change in health will improve Outcome: Progressing   Problem: Safety: Goal: Non-violent Restraint(s) Outcome: Completed/Met

## 2022-01-20 NOTE — Progress Notes (Signed)
Speech Language Pathology Discharge Patient Details Name: Jeffrey Campbell MRN: [id number] DOB: 1933/01/24 Today's Date: 01/20/2022 Time:  -     Patient discharged from SLP services secondary to medical decline - will need to re-order SLP to resume therapy services. ST will sign off. Continuing to require ventilatory support. Please reconsult if appropriate.   Please see latest therapy progress note for current level of functioning and progress toward goals.    Progress and discharge plan discussed with patient and/or caregiver: Patient unable to participate in discharge planning and no caregivers available   Houston Siren 01/20/2022, 8:22 AM

## 2022-01-20 NOTE — Progress Notes (Signed)
Nephrology Progress Note:  Subjective:  Last HD on 10/23 with 1 kg UF.   Neurology has been seeing for stroke.  MRI with multiple infarcts c/w shower of microemboli.    Review of systems: unable to obtain 2/2 intubated   -------------------------- Notable recent events:  suffered PEA cardiac arrest on 10/21 and was intubated.  CT on 10/22 noted small CVA.    Objective Vital signs in last 24 hours: Vitals:   01/20/22 0306 01/20/22 0321 01/20/22 0400 01/20/22 0500  BP:   (!) 126/58   Pulse: 69  69   Resp: 18  18   Temp:  98.3 F (36.8 C)    TempSrc:  Axillary    SpO2: 100%  100%   Weight:    51.4 kg  Height:       Weight change: 0.3 kg  Intake/Output Summary (Last 24 hours) at 01/20/2022 0608 Last data filed at 01/19/2022 1800 Gross per 24 hour  Intake 616.63 ml  Output --  Net 616.63 ml    Dialysis Orders:  MWF  - [facility]  4hrs, BFR 400, DFR AF1.5,  EDW 54.5kg, 2K/ 2Ca   Access: AVF  Heparin 3000 unit bolus Calcitriol 0.5 mcg PO qHD Sensipar '120mg'$  qHD     Assessment/Plan: AMS - likely 2/2 bacteremia and PNA. EEG with no seizure activity. Hx dementia.   Acute respiratory failure - intubated initially. Concern for aspiration on CT. Per CCM.  Able to be extubated 10/18 however required reintubation for cardiac arrest on 10/21.  Vent per critical care.  Optimize volume with HD as well - note we didn't attempt aggressive UF on 10/23 HD treatment due to recent stroke  Aspiration PNA - noted on CT. Abx per primary team.   Bacteremia - BC +streptococcus pneumoniae. Abx per critical care ESRD -  On HD MWF schedule. Await today's labs.    Hypotension - optimize volume with HD. He is on midodrine TID  Anemia of CKD - off of ESA now in setting of acute stroke.  No iron in setting of acute infection. Secondary Hyperparathyroidism -  on calcitriol.  No binders now  Acute CVA - MRI with multiple infarcts c/w shower of microemboli.  Per primary team and neuro  CAD, CHB s/p PPM DMT2  - per primary team  COPD - noted decreased resp reserve  Disposition - in the ICU; per primary team   Labs: Basic Metabolic Panel: Recent Labs  Lab 01/17/22 0331 01/18/22 0714 01/19/22 0316  NA 138 141 141  K 3.8 3.9 3.8  CL 98 103 96*  CO2 '26 26 28  '$ GLUCOSE 198* 190* 158*  BUN 38* 58* 38*  CREATININE 4.58* 5.73* 3.77*  CALCIUM 9.7 9.9 9.7  PHOS 2.5 1.6* 2.6   Liver Function Tests: Recent Labs  Lab 01/17/22 0331 01/18/22 0714 01/19/22 0316  ALBUMIN 2.2* 2.1* 2.2*   No results for input(s): "LIPASE", "AMYLASE" in the last 168 hours. No results for input(s): "AMMONIA" in the last 168 hours. CBC: Recent Labs  Lab 01/14/22 0453 01/15/22 0445 01/16/22 1309 01/18/22 0714 01/19/22 0316  WBC 7.6 6.2  --  8.6 14.0*  NEUTROABS  --   --   --  6.7 11.0*  HGB 9.0* 9.1* 9.9* 8.8* 10.0*  HCT 27.9* 27.4* 29.0* 27.1* 31.0*  MCV 100.4* 100.0  --  101.1* 100.6*  PLT 82* 78*  --  89* 102*   Cardiac Enzymes: No results for input(s): "CKTOTAL", "CKMB", "CKMBINDEX", "TROPONINI" in the last 168  hours. CBG: Recent Labs  Lab 01/19/22 1140 01/19/22 1537 01/19/22 1933 01/19/22 2320 01/20/22 0319  GLUCAP 157* 104* 163* 190* 176*    Iron Studies: No results for input(s): "IRON", "TIBC", "TRANSFERRIN", "FERRITIN" in the last 72 hours. Studies/Results: VAS US CAROTID (at [facility] and [facility] only)  Result Date: 01/19/2022 Carotid Arterial Duplex Study Patient Name:  Jeffrey Campbell  Date of Exam:   01/19/2022 Medical Rec #: [id number]      Accession #:    [id number] Date of Birth: [birth year]      Patient Gender: M Patient Age:   88 years Exam Location:  [facility] Procedure:      VAS US CAROTID Referring Phys: Beulah Gandy --------------------------------------------------------------------------------  Indications:       CVA. Risk Factors:      Hypertension, hyperlipidemia, past history of smoking,                    coronary artery disease. Comparison Study:  05/11/2019 carotid artery  duplex- bilateral 1-39% carotid                    artery stenosis Performing Technologist: Maudry Mayhew MHA, RDMS, RVT, RDCS  Examination Guidelines: A complete evaluation includes B-mode imaging, spectral Doppler, color Doppler, and power Doppler as needed of all accessible portions of each vessel. Bilateral testing is considered an integral part of a complete examination. Limited examinations for reoccurring indications may be performed as noted.  Right Carotid Findings: +----------+--------+--------+--------+---------------------+------------------+           PSV cm/sEDV cm/sStenosisPlaque Description   Comments           +----------+--------+--------+--------+---------------------+------------------+ CCA Prox  82      11              irregular, focal and                                                      heterogenous                            +----------+--------+--------+--------+---------------------+------------------+ CCA Distal60      8                                    intimal thickening +----------+--------+--------+--------+---------------------+------------------+ ICA Prox  42      11              heterogenous and     Shadowing                                            calcific                                +----------+--------+--------+--------+---------------------+------------------+ ICA Distal52      14                                                      +----------+--------+--------+--------+---------------------+------------------+  ECA       66                      heterogenous,        shadowing                                            irregular and                                                             calcific                                +----------+--------+--------+--------+---------------------+------------------+ +----------+--------+-------+----------------+-------------------+           PSV  cm/sEDV cmsDescribe        Arm Pressure (mmHG) +----------+--------+-------+----------------+-------------------+ [id number]            Multiphasic, WNL                    +----------+--------+-------+----------------+-------------------+ +---------+--------+--+--------+-+---------+ VertebralPSV cm/s40EDV cm/s7Antegrade +---------+--------+--+--------+-+---------+  Left Carotid Findings: +----------+--------+--------+--------+--------------------------+---------+           PSV cm/sEDV cm/sStenosisPlaque Description        Comments  +----------+--------+--------+--------+--------------------------+---------+ CCA Prox  83                                                          +----------+--------+--------+--------+--------------------------+---------+ CCA Distal76      7                                                   +----------+--------+--------+--------+--------------------------+---------+ ICA Prox  53      11              heterogenous and calcific Shadowing +----------+--------+--------+--------+--------------------------+---------+ ICA Distal50      11                                                  +----------+--------+--------+--------+--------------------------+---------+ ECA       74                      irregular and heterogenous          +----------+--------+--------+--------+--------------------------+---------+ +----------+--------+--------+----------------+-------------------+           PSV cm/sEDV cm/sDescribe        Arm Pressure (mmHG) +----------+--------+--------+----------------+-------------------+ Subclavian209             Multiphasic, WNL                    +----------+--------+--------+----------------+-------------------+ +---------+--------+--+--------+-+----------------------+ VertebralPSV cm/s31EDV cm/s6Monophasic, AVF in LUE +---------+--------+--+--------+-+----------------------+   Summary: Right  Carotid: Velocities in the right ICA are consistent  with a 1-39% stenosis. Left Carotid: Velocities in the left ICA are consistent with a 1-39% stenosis. Vertebrals:  Bilateral vertebral arteries demonstrate antegrade flow. Subclavians: Normal flow hemodynamics were seen in the right subclavian artery.              Left subclavian artery exhibits monophasic flow; AVF in left upper              extremity. *See table(s) above for measurements and observations.     Preliminary    MR BRAIN WO CONTRAST  Result Date: 01/19/2022 CLINICAL DATA:  Left inferior cerebellar stroke shown by CT EXAM: MRI HEAD WITHOUT CONTRAST TECHNIQUE: Multiplanar, multiecho pulse sequences of the brain and surrounding structures were obtained without intravenous contrast. COMPARISON:  MR angiography same day. Head CT done 2 days ago. MRI 09/02/2018. FINDINGS: Brain: Diffusion imaging shows a punctate acute infarction at the anterior inferior cerebellum on the right. There is a 1.5 cm acute infarction at the inferior cerebellum on the left. Punctate acute infarction in the right pons. In the left cerebral hemisphere, there is a punctate acute infarction at the left parietooccipital cortex, 2 punctate acute infarctions in the right thalamus, and a punctate acute infarction at the left posterior frontal vertex and left parietal vertex. In the right hemisphere, there are small grouping of punctate acute infarctions in the right posterior temporal lobe an additional punctate acute infarctions in the right occipital lobe, right caudate head, right medial thalamus, right caudate body and right frontoparietal vertex regions. Findings are likely represent a shower of micro emboli from the heart or ascending aorta. Confluent chronic small vessel ischemic changes are seen throughout the cerebral hemispheric white matter. There is no acute hemorrhage. No hydrocephalus. No extra-axial collection. Generalized brain volume loss is present. Vascular:  Major vessels at the base of the brain show flow. Skull and upper cervical spine: Hypercellular marrow pattern in the cervical region. Sinuses/Orbits: Clear/normal Other: None IMPRESSION: 1. 1.5 cm acute infarction at the inferior cerebellum on the left. Punctate acute infarction at the anterior inferior cerebellum on the right. Punctate acute infarction in the right pons. 2. Punctate acute infarctions scattered throughout both cerebral hemispheres as above. Findings are consistent with a shower of micro emboli from the heart or ascending aorta. 3. Confluent chronic small-vessel ischemic changes throughout the hemispheric white matter. Generalized brain volume loss. Electronically Signed   By: Nelson Chimes M.D.   On: 01/19/2022 15:02   MR ANGIO HEAD WO CONTRAST  Result Date: 01/19/2022 CLINICAL DATA:  Neuro deficit, acute, stroke suspected. Small left inferior cerebellar infarction. EXAM: MRA HEAD WITHOUT CONTRAST TECHNIQUE: Angiographic images of the Circle of Willis were acquired using MRA technique without intravenous contrast. COMPARISON:  CT 01/17/2022.  MRI same day. FINDINGS: Anterior circulation: Both internal carotid arteries are patent through the skull base and siphon regions. Pronounced atherosclerotic irregularity within the carotid siphon regions, possibly with atherosclerotic aneurysm formation, left more extensive than right. Severe stenosis of the left ICA at the proximal siphon and moderate stenosis of the right ICA at the proximal siphon. The anterior and middle cerebral vessels are patent. No large vessel occlusion or flow limiting proximal stenosis identified. No berry aneurysm. Posterior circulation: Both vertebral arteries are patent through the foramen magnum to the basilar artery. Moderate stenosis of the distal right vertebral artery just proximal to the basilar. No basilar stenosis. Flow is seen within the superior cerebellar and posterior cerebral arteries. There is a focal stenosis  of the left P1  segment which could be flow limiting. Anatomic variants: None significant. Other: None. IMPRESSION: 1. No large vessel occlusion. 2. Atherosclerotic irregularity of the carotid siphon regions, left more extensive than right. Severe stenosis of the left ICA at the proximal siphon and moderate stenosis of the right ICA at the proximal siphon. Atherosclerotic aneurysm formation, left more extensive than right. This would probably be more accurately evaluated with CT angiography. 3. Focal stenosis of the left P1 segment which could be flow limiting. 4. Moderate stenosis of the distal right vertebral artery just proximal to the basilar. Electronically Signed   By: Nelson Chimes M.D.   On: 01/19/2022 14:57   DG CHEST PORT 1 VIEW  Result Date: 01/19/2022 CLINICAL DATA:  Ventilator dependence. EXAM: PORTABLE CHEST 1 VIEW COMPARISON:  01/17/2022 FINDINGS: 0823 hours. Endotracheal tube tip is 3.7 cm above the base of the carina. A feeding tube passes into the stomach although the distal tip position is not included on the film. The cardio pericardial silhouette is enlarged. Right base collapse/consolidation is progressive in the interval with new 3 cm nodular component on today's exam. There is persistent retrocardiac left base collapse/consolidation with tiny left effusion. Interstitial markings are diffusely coarsened with chronic features. Telemetry leads overlie the chest. IMPRESSION: 1. Interval progression of right base collapse/consolidation with new 3 cm nodular component on today's exam. Close follow-up recommended. 2. Persistent retrocardiac left base collapse/consolidation with tiny left effusion. Electronically Signed   By: Misty Stanley M.D.   On: 01/19/2022 08:33   Medications: Infusions:  sodium chloride Stopped (01/18/22 1700)   cefTRIAXone (ROCEPHIN)  IV Stopped (01/19/22 1644)   feeding supplement (PEPTAMEN 1.5 CAL) 1,000 mL (01/19/22 1714)   heparin 700 Units/hr (01/19/22 2047)     Scheduled Medications:   stroke: early stages of recovery book   Does not apply Once   acetylcysteine  3 mL Nebulization TID   aspirin  81 mg Per Tube Daily   atorvastatin  20 mg Per Tube Daily   brinzolamide  1 drop Both Eyes BID   And   brimonidine  1 drop Both Eyes BID   calcitRIOL  0.5 mcg Per Tube Q M,W,F-HD   Chlorhexidine Gluconate Cloth  6 each Topical Q0600   Chlorhexidine Gluconate Cloth  6 each Topical Q0600   donepezil  10 mg Per Tube QHS   feeding supplement (PROSource TF20)  60 mL Per Tube Daily   fentaNYL (SUBLIMAZE) injection  50 mcg Intravenous Once   fiber supplement (BANATROL TF)  60 mL Per Tube BID   insulin aspart  0-15 Units Subcutaneous Q4H   levothyroxine  150 mcg Per Tube Q0600   lidocaine (PF)  5 mL Other Once   memantine  10 mg Per Tube BID   midodrine  5 mg Per Tube Q8H   multivitamin  1 tablet Per Tube QHS   mouth rinse  15 mL Mouth Rinse Q2H   pantoprazole  40 mg Per Tube Daily    have reviewed scheduled and prn medications.  Physical Exam:    General adult male in bed in no acute distress HEENT normocephalic atraumatic sclera anicteric Neck supple trachea midline Lungs rhonchi no air hunger; FIO2 40 and peep 5 Heart S1S2 no rub Abdomen soft nontender nondistended Extremities no pitting edema  Psych no anxiety or agitation  Neuro - no sedation running. Tracks intermittently but does not follow simple motor commands for me  Access: LUE AVF bruit and thrill    Claudia Desanctis,  MD 01/20/2022,6:19 AM

## 2022-01-20 NOTE — Progress Notes (Addendum)
STROKE TEAM PROGRESS NOTE   INTERVAL HISTORY Patient is seen in his room with no family at the bedside.  Patient has been in atrial fibrillation but was not anticoagulated due to fall risk, now on heparin. Currently on HD.  Patient remains intubated for respiratory failure, sedation is off this morning.  He appears quite drowsy however can be aroused and follows commands and moves all 4 extremities purposefully.  Vital signs are stable.  MRI scan shows bicerebral multiple embolic infarcts  Vitals:   01/20/22 0400 01/20/22 0500 01/20/22 0700 01/20/22 0730  BP: (!) 126/58 (!) 115/54 (!) 144/73 122/63  Pulse: 69 70 70 70  Resp: '18 18 18 18  '$ Temp:    98.4 F (36.9 C)  TempSrc:    Oral  SpO2: 100% 100% 100% 100%  Weight:  51.4 kg    Height:       CBC:  Recent Labs  Lab 01/18/22 0714 01/19/22 0316  WBC 8.6 14.0*  NEUTROABS 6.7 11.0*  HGB 8.8* 10.0*  HCT 27.1* 31.0*  MCV 101.1* 100.6*  PLT 89* 102*    Basic Metabolic Panel:  Recent Labs  Lab 01/14/22 0453 01/15/22 0445 01/16/22 1309 01/18/22 0714 01/19/22 0316  NA 138 138   < > 141 141  K 3.3* 3.2*   < > 3.9 3.8  CL 96* 96*   < > 103 96*  CO2 26 24   < > 26 28  GLUCOSE 101* 111*   < > 190* 158*  BUN 23 35*   < > 58* 38*  CREATININE 3.31* 4.55*   < > 5.73* 3.77*  CALCIUM 9.0 9.3   < > 9.9 9.7  MG 1.6* 2.2  --   --   --   PHOS 3.8 3.5  3.5   < > 1.6* 2.6   < > = values in this interval not displayed.    Lipid Panel:  Recent Labs  Lab 01/19/22 0316  CHOL 85  TRIG 50  HDL 34*  CHOLHDL 2.5  VLDL 10  LDLCALC 41    HgbA1c:  No results for input(s): "HGBA1C" in the last 168 hours.  Urine Drug Screen: No results for input(s): "LABOPIA", "COCAINSCRNUR", "LABBENZ", "AMPHETMU", "THCU", "LABBARB" in the last 168 hours.  Alcohol Level No results for input(s): "ETH" in the last 168 hours.  IMAGING past 24 hours VAS US CAROTID (at [facility] and [facility] only)  Result Date: 01/20/2022 Carotid Arterial Duplex Study Patient  Name:  Jeffrey Campbell  Date of Exam:   01/19/2022 Medical Rec #: [id number]      Accession #:    [id number] Date of Birth: 1932-07-11      Patient Gender: M Patient Age:   88 years Exam Location:  [facility] Procedure:      VAS US CAROTID Referring Phys: Beulah Gandy --------------------------------------------------------------------------------  Indications:       CVA. Risk Factors:      Hypertension, hyperlipidemia, past history of smoking,                    coronary artery disease. Comparison Study:  05/11/2019 carotid artery duplex- bilateral 1-39% carotid                    artery stenosis Performing Technologist: Maudry Mayhew MHA, RDMS, RVT, RDCS  Examination Guidelines: A complete evaluation includes B-mode imaging, spectral Doppler, color Doppler, and power Doppler as needed of all accessible portions of each vessel. Bilateral testing is  considered an integral part of a complete examination. Limited examinations for reoccurring indications may be performed as noted.  Right Carotid Findings: +----------+--------+--------+--------+---------------------+------------------+           PSV cm/sEDV cm/sStenosisPlaque Description   Comments           +----------+--------+--------+--------+---------------------+------------------+ CCA Prox  82      11              irregular, focal and                                                      heterogenous                            +----------+--------+--------+--------+---------------------+------------------+ CCA Distal60      8                                    intimal thickening +----------+--------+--------+--------+---------------------+------------------+ ICA Prox  42      11              heterogenous and     Shadowing                                            calcific                                +----------+--------+--------+--------+---------------------+------------------+ ICA Distal52      14                                                       +----------+--------+--------+--------+---------------------+------------------+ ECA       66                      heterogenous,        shadowing                                            irregular and                                                             calcific                                +----------+--------+--------+--------+---------------------+------------------+ +----------+--------+-------+----------------+-------------------+           PSV cm/sEDV cmsDescribe        Arm Pressure (mmHG) +----------+--------+-------+----------------+-------------------+ [id number]            Multiphasic, WNL                    +----------+--------+-------+----------------+-------------------+ +---------+--------+--+--------+-+---------+  VertebralPSV cm/s40EDV cm/s7Antegrade +---------+--------+--+--------+-+---------+  Left Carotid Findings: +----------+--------+--------+--------+--------------------------+---------+           PSV cm/sEDV cm/sStenosisPlaque Description        Comments  +----------+--------+--------+--------+--------------------------+---------+ CCA Prox  83                                                          +----------+--------+--------+--------+--------------------------+---------+ CCA Distal76      7                                                   +----------+--------+--------+--------+--------------------------+---------+ ICA Prox  53      11              heterogenous and calcific Shadowing +----------+--------+--------+--------+--------------------------+---------+ ICA Distal50      11                                                  +----------+--------+--------+--------+--------------------------+---------+ ECA       74                      irregular and heterogenous          +----------+--------+--------+--------+--------------------------+---------+  +----------+--------+--------+----------------+-------------------+           PSV cm/sEDV cm/sDescribe        Arm Pressure (mmHG) +----------+--------+--------+----------------+-------------------+ Subclavian209             Multiphasic, WNL                    +----------+--------+--------+----------------+-------------------+ +---------+--------+--+--------+-+----------------------+ VertebralPSV cm/s31EDV cm/s6Monophasic, AVF in LUE +---------+--------+--+--------+-+----------------------+   Summary: Right Carotid: Velocities in the right ICA are consistent with a 1-39% stenosis. Left Carotid: Velocities in the left ICA are consistent with a 1-39% stenosis. Vertebrals:  Bilateral vertebral arteries demonstrate antegrade flow. Subclavians: Normal flow hemodynamics were seen in the right subclavian artery.              Left subclavian artery exhibits monophasic flow; AVF in left upper              extremity. *See table(s) above for measurements and observations.  Electronically signed by Deitra Mayo MD on 01/20/2022 at 6:17:44 AM.    Final    MR BRAIN WO CONTRAST  Result Date: 01/19/2022 CLINICAL DATA:  Left inferior cerebellar stroke shown by CT EXAM: MRI HEAD WITHOUT CONTRAST TECHNIQUE: Multiplanar, multiecho pulse sequences of the brain and surrounding structures were obtained without intravenous contrast. COMPARISON:  MR angiography same day. Head CT done 2 days ago. MRI 09/02/2018. FINDINGS: Brain: Diffusion imaging shows a punctate acute infarction at the anterior inferior cerebellum on the right. There is a 1.5 cm acute infarction at the inferior cerebellum on the left. Punctate acute infarction in the right pons. In the left cerebral hemisphere, there is a punctate acute infarction at the left parietooccipital cortex, 2 punctate acute infarctions in the right thalamus, and a punctate acute infarction at the left posterior frontal vertex and left parietal vertex. In the right  hemisphere, there are small  grouping of punctate acute infarctions in the right posterior temporal lobe an additional punctate acute infarctions in the right occipital lobe, right caudate head, right medial thalamus, right caudate body and right frontoparietal vertex regions. Findings are likely represent a shower of micro emboli from the heart or ascending aorta. Confluent chronic small vessel ischemic changes are seen throughout the cerebral hemispheric white matter. There is no acute hemorrhage. No hydrocephalus. No extra-axial collection. Generalized brain volume loss is present. Vascular: Major vessels at the base of the brain show flow. Skull and upper cervical spine: Hypercellular marrow pattern in the cervical region. Sinuses/Orbits: Clear/normal Other: None IMPRESSION: 1. 1.5 cm acute infarction at the inferior cerebellum on the left. Punctate acute infarction at the anterior inferior cerebellum on the right. Punctate acute infarction in the right pons. 2. Punctate acute infarctions scattered throughout both cerebral hemispheres as above. Findings are consistent with a shower of micro emboli from the heart or ascending aorta. 3. Confluent chronic small-vessel ischemic changes throughout the hemispheric white matter. Generalized brain volume loss. Electronically Signed   By: Nelson Chimes M.D.   On: 01/19/2022 15:02   MR ANGIO HEAD WO CONTRAST  Result Date: 01/19/2022 CLINICAL DATA:  Neuro deficit, acute, stroke suspected. Small left inferior cerebellar infarction. EXAM: MRA HEAD WITHOUT CONTRAST TECHNIQUE: Angiographic images of the Circle of Willis were acquired using MRA technique without intravenous contrast. COMPARISON:  CT 01/17/2022.  MRI same day. FINDINGS: Anterior circulation: Both internal carotid arteries are patent through the skull base and siphon regions. Pronounced atherosclerotic irregularity within the carotid siphon regions, possibly with atherosclerotic aneurysm formation, left more  extensive than right. Severe stenosis of the left ICA at the proximal siphon and moderate stenosis of the right ICA at the proximal siphon. The anterior and middle cerebral vessels are patent. No large vessel occlusion or flow limiting proximal stenosis identified. No berry aneurysm. Posterior circulation: Both vertebral arteries are patent through the foramen magnum to the basilar artery. Moderate stenosis of the distal right vertebral artery just proximal to the basilar. No basilar stenosis. Flow is seen within the superior cerebellar and posterior cerebral arteries. There is a focal stenosis of the left P1 segment which could be flow limiting. Anatomic variants: None significant. Other: None. IMPRESSION: 1. No large vessel occlusion. 2. Atherosclerotic irregularity of the carotid siphon regions, left more extensive than right. Severe stenosis of the left ICA at the proximal siphon and moderate stenosis of the right ICA at the proximal siphon. Atherosclerotic aneurysm formation, left more extensive than right. This would probably be more accurately evaluated with CT angiography. 3. Focal stenosis of the left P1 segment which could be flow limiting. 4. Moderate stenosis of the distal right vertebral artery just proximal to the basilar. Electronically Signed   By: Nelson Chimes M.D.   On: 01/19/2022 14:57   DG CHEST PORT 1 VIEW  Result Date: 01/19/2022 CLINICAL DATA:  Ventilator dependence. EXAM: PORTABLE CHEST 1 VIEW COMPARISON:  01/17/2022 FINDINGS: 0823 hours. Endotracheal tube tip is 3.7 cm above the base of the carina. A feeding tube passes into the stomach although the distal tip position is not included on the film. The cardio pericardial silhouette is enlarged. Right base collapse/consolidation is progressive in the interval with new 3 cm nodular component on today's exam. There is persistent retrocardiac left base collapse/consolidation with tiny left effusion. Interstitial markings are diffusely  coarsened with chronic features. Telemetry leads overlie the chest. IMPRESSION: 1. Interval progression of right base collapse/consolidation with new  3 cm nodular component on today's exam. Close follow-up recommended. 2. Persistent retrocardiac left base collapse/consolidation with tiny left effusion. Electronically Signed   By: Misty Stanley M.D.   On: 01/19/2022 08:33    PHYSICAL EXAM General:  Intubated frail elderly Asian patient in no acute distress. . Afebrile. Head is nontraumatic. Neck is supple without bruit.    Cardiac exam no murmur or gallop. Lungs are clear to auscultation. Distal pulses are well felt.  Respiratory: respiration synchronous with ventilator Neurological:Exam : Patient is intubated and sedated but can be aroused and opens eyes partially.  PERRL, EOMI, blinks to threat on both sides, able to follow commands with all extremities and able to move them purposefully against gravity.  No focal weakness.  ASSESSMENT/PLAN Jeffrey Campbell is a 88 y.o. male with history of HTN, HLD, atrial fibrillation not on anticoagulation, SDH, CHF, COPD, OSA, PAD, CAN and ESRD on HD who was originally admitted with pneumococcus pneumonia, was intubated on 10/17 and extubated on 10/18, then developed respiratory distress on 10/21 and was reintubated.  He then had a brief PEA arrest with about 4 minutes of downtime before ROSC was achieved.  A head CT was repeated on 10/22, showing a small stroke in the left cerebellum.  Patient has been in atrial fibrillation but was not anticoagulated due to fall risk.  Stroke:  left cerebellar stroke Etiology:  cardioembolic in setting of a-fib not on anticoagulation  CT head No acute abnormality. Small vessel disease. Atrophy. Small acute infarct in inferior left cerebellum MRI 1.5 cm acute infarction at the inferior cerebellum on the left. Punctate acute infarction at the anterior inferior cerebellum on the right. Punctate acute infarction in the right pons.  Punctate acute infarctions scattered throughout both cerebral hemispheres as above MRA  Severe stenosis of the left ICA at the proximal siphon and moderate stenosis of the right ICA at the proximal siphon. Atherosclerotic aneurysm formation, left more extensive than right. Focal stenosis of the left P1 segment which could be flow limiting. Carotid Doppler  1-39% stenosis bilaterally, AVF in left upper extremity  2D Echo EF 45-50%. Grade 3 diastolic dysfunction, severely dilated left atrium, mitral and tricuspid regurgitation, no atrial level shunt LDL 41 HgbA1c 5.2 VTE prophylaxis - fully anticoagulated with heparin    Diet   Diet NPO time specified   No antithrombotic prior to admission, now on heparin IV for A-fib but not sure if he is a good long-term anticoagulation candidate Therapy recommendations:  pending Disposition:  pending  Atrial fibrillation Patient has history of a-fib, not anticoagulated due to fall risk and prior SDH Anticoagulate now with heparin IV Consider long term anticoagulation on discharge  Respiratory failure and pneumonia Patient reintubated 10/21 Ventilator management per CCM Antibiotics per CCM- on rocephin  Hypertension Home meds:  doxazosin 4 mg daily Stable Permissive hypertension (OK if < 220/120) but gradually normalize in 5-7 days Long-term BP goal normotensive  Hyperlipidemia Home meds:  atorvastatin 20 mg daily, resumed in hospital LDL 41, goal < 70 High intensity statin not indicated as LDL below goal Continue statin at discharge   Other Stroke Risk Factors Advanced Age >/= 44  Former cigarette smoker Congestive heart failure  Other Active Problems Recent PEA arrest No evidence of hypoxic brain in jury seen on [facility] day # 8  Patient seen and examined by NP/APP with MD. MD to update note as needed.   Janine Ores, DNP, FNP-BC [facility] Pager: [phone]  I have personally obtained history,examined  this patient, reviewed notes, independently viewed imaging studies, participated in medical decision making and plan of care.ROS completed by me personally and pertinent positives fully documented  I have made any additions or clarifications directly to the above note. Agree with note above.  Continue ventilatory support for respiratory failure and wean as tolerated per critical care medicine.  Continue heparin for A-fib and will discuss with family about risk benefit of oral anticoagulation after extubation  when he is able to swallow low echo: Discussed with Dr. Ander Slade.This patient is critically ill and at significant risk of neurological worsening, death and care requires constant monitoring of vital signs, hemodynamics,respiratory and cardiac monitoring, extensive review of multiple databases, frequent neurological assessment, discussion with family, other specialists and medical decision making of high complexity.I have made any additions or clarifications directly to the above note.This critical care time does not reflect procedure time, or teaching time or supervisory time of PA/NP/Med Resident etc but could involve care discussion time.  I spent 30 minutes of neurocritical care time  in the care of  this patient.    Jeffrey Contras, MD Medical Director [place] Pager: [phone] 01/20/2022 1:01 PM  To contact Stroke Continuity provider, please refer to [email or web address] After hours, contact General Neurology

## 2022-01-20 NOTE — Progress Notes (Signed)
[place] for Heparin Indication: atrial fibrillation  Allergies  Allergen Reactions   Cardura [Doxazosin] Shortness Of Breath and Other (See Comments)    Dizziness    Hydrochlorothiazide Shortness Of Breath and Other (See Comments)    Dizziness    Iodinated Contrast Media Other (See Comments)    "shut down his kidneys" Patient has stage III chronic kidney disease   Zocor [Simvastatin] Other (See Comments)    Kidney problems    Protonix [Pantoprazole] Other (See Comments)    Worsening kidney problems   Rocaltrol [Calcitriol] Other (See Comments)    Unknown reaction   Budesonide-Formoterol Fumarate Other (See Comments)    Dry mouth   Minodyl [Minoxidil] Other (See Comments)    Unknown raction   Tiotropium Bromide Monohydrate Other (See Comments)    Dry mouth   Tricor [Fenofibrate]     Unknown reaction    Patient Measurements: Height: '5\' 9"'$  (175.3 cm) Weight: 57.9 kg (127 lb 10.3 oz) (this wt was w/o the 2 extra pillows that i left on the bed for the pre wt and this wt was much greater than pre wt. i don't think this is accurate) IBW/kg (Calculated) : 70.7 Heparin Dosing Weight: 59 kg   Vital Signs: Temp: 98.1 F (36.7 C) (10/25 1923) Temp Source: Axillary (10/25 1923) BP: 122/60 (10/25 1800) Pulse Rate: 70 (10/25 1800)  Labs: Recent Labs    01/18/22 0714 01/19/22 0316 01/19/22 1946 01/20/22 0813 01/20/22 1828  HGB 8.8* 10.0*  --  9.4*  --   HCT 27.1* 31.0*  --  27.7*  --   PLT 89* 102*  --  128*  --   HEPARINUNFRC  --   --  <0.10* <0.10* <0.10*  CREATININE 5.73* 3.77*  --  5.11*  --      Estimated Creatinine Clearance: 8.5 mL/min (A) (by C-G formula based on SCr of 5.11 mg/dL (H)).  Assessment: 88 yo male admitted 01/04/2022 found to have strep pneumonia bacteremia and PNA. Now found to have small infarct in L cerebellum. Pharmacy consulted for IV heparin. No bolus, low heparin level goal given CVA.  Heparin level  remains undetectable at < 0.10 after increase to 850 units/hr. No signs of bleeding reported, no report of infusion issues.  H/H is low but stable.  Platelets have improved at 128   Goal of Therapy:  Heparin level 0.3-0.5 units/ml Monitor platelets by anticoagulation protocol: Yes   Plan:  Increase heparin to 950 units/hr - no bolus given CVA Recheck 8hr heparin level  Monitor heparin level, CBC and s/s of bleeding daily  Watch plts closely   Dimple Nanas, PharmD, BCPS 01/20/2022 7:30 PM

## 2022-01-20 NOTE — Progress Notes (Signed)
Updated patient's daughter about bronchoscopy findings

## 2022-01-20 NOTE — Progress Notes (Signed)
CPT held at this time as pt was getting HD at the bedside

## 2022-01-21 ENCOUNTER — Inpatient Hospital Stay (HOSPITAL_COMMUNITY): Payer: Medicare Other

## 2022-01-21 DIAGNOSIS — G9340 Encephalopathy, unspecified: Secondary | ICD-10-CM | POA: Diagnosis not present

## 2022-01-21 LAB — GLUCOSE, CAPILLARY
Glucose-Capillary: 183 mg/dL — ABNORMAL HIGH (ref 70–99)
Glucose-Capillary: 192 mg/dL — ABNORMAL HIGH (ref 70–99)
Glucose-Capillary: 200 mg/dL — ABNORMAL HIGH (ref 70–99)
Glucose-Capillary: 195 mg/dL — ABNORMAL HIGH (ref 70–99)
Glucose-Capillary: 131 mg/dL — ABNORMAL HIGH (ref 70–99)
Glucose-Capillary: 160 mg/dL — ABNORMAL HIGH (ref 70–99)
Glucose-Capillary: 178 mg/dL — ABNORMAL HIGH (ref 70–99)

## 2022-01-21 LAB — RENAL FUNCTION PANEL
Albumin: 2.6 g/dL — ABNORMAL LOW (ref 3.5–5.0)
Anion gap: 13 (ref 5–15)
BUN: 44 mg/dL — ABNORMAL HIGH (ref 8–23)
CO2: 29 mmol/L (ref 22–32)
Calcium: 10.1 mg/dL (ref 8.9–10.3)
Chloride: 95 mmol/L — ABNORMAL LOW (ref 98–111)
Creatinine, Ser: 3.79 mg/dL — ABNORMAL HIGH (ref 0.61–1.24)
GFR, Estimated: 15 mL/min — ABNORMAL LOW (ref >60)
Glucose, Bld: 179 mg/dL — ABNORMAL HIGH (ref 70–99)
Phosphorus: 3.1 mg/dL (ref 2.5–4.6)
Potassium: 3.9 mmol/L (ref 3.5–5.1)
Sodium: 137 mmol/L (ref 135–145)

## 2022-01-21 LAB — CBC
HCT: 26.3 % — ABNORMAL LOW (ref 39.0–52.0)
Hemoglobin: 8.8 g/dL — ABNORMAL LOW (ref 13.0–17.0)
MCH: 33.1 pg (ref 26.0–34.0)
MCHC: 33.5 g/dL (ref 30.0–36.0)
MCV: 98.9 fL (ref 80.0–100.0)
Platelets: 148 10*3/uL — ABNORMAL LOW (ref 150–400)
RBC: 2.66 MIL/uL — ABNORMAL LOW (ref 4.22–5.81)
RDW: 15.7 % — ABNORMAL HIGH (ref 11.5–15.5)
WBC: 9.8 10*3/uL (ref 4.0–10.5)
nRBC: 0 % (ref 0.0–0.2)

## 2022-01-21 LAB — HEPARIN LEVEL (UNFRACTIONATED)
Heparin Unfractionated: <0.1 IU/mL — ABNORMAL LOW (ref 0.30–0.70)
Heparin Unfractionated: <0.1 IU/mL — ABNORMAL LOW (ref 0.30–0.70)

## 2022-01-21 MED ORDER — CHLORHEXIDINE GLUCONATE CLOTH 2 % EX PADS
6.0000 | MEDICATED_PAD | Freq: Every day | CUTANEOUS | Status: DC
  Administered 2022-01-23 – 2022-01-24 (×2): 6 via TOPICAL

## 2022-01-21 MED FILL — Medication: Qty: 1 | Status: AC

## 2022-01-21 NOTE — Progress Notes (Signed)
Updated patients daughter about his status

## 2022-01-21 NOTE — Progress Notes (Signed)
ANTICOAGULATION CONSULT NOTE - Follow Up Consult  Pharmacy Consult for heparin Indication: atrial fibrillation in setting of CVA with multiple infarcts c/w shower of microemboli  Labs: Recent Labs    01/19/22 0316 01/19/22 1946 01/20/22 0813 01/20/22 1828 01/21/22 0524 01/21/22 1433  HGB 10.0*  --  9.4*  --  8.8*  --   HCT 31.0*  --  27.7*  --  26.3*  --   PLT 102*  --  128*  --  148*  --   HEPARINUNFRC  --    < > <0.10* <0.10* <0.10* <0.10*  CREATININE 3.77*  --  5.11*  --  3.79*  --    < > = values in this interval not displayed.     Assessment: 88yo male subtherapeutic on heparin after rate change (<0.10); no infusion issues or signs of bleeding per RN. Hgb down trending and plts 148  Goal of Therapy:  Heparin level 0.3-0.5 units/ml   Plan:  Will increase heparin infusion by 3 units/kg/hr to 1250 units/hr and check level in 8 hours.   Monitor CBC daily Monitor for signs/symptoms of bleeding  Sandford Craze, PharmD. [facility] Acute Care PGY-1  01/21/2022 4:13 PM

## 2022-01-21 NOTE — Progress Notes (Signed)
Nephrology Progress Note:  Subjective:  Last HD on 10/25 with 2.6 kg UF.  He had a bronchoscopy yesterday with thick mucoid secretions bilaterally.       Review of systems: unable to obtain 2/2 intubated   -------------------------- Notable recent events:  suffered PEA cardiac arrest on 10/21 and was intubated.  CT on 10/22 noted small CVA.  MRI with multiple infarcts c/w shower of microemboli.     Objective Vital signs in last 24 hours: Vitals:   01/21/22 0305 01/21/22 0336 01/21/22 0400 01/21/22 0500  BP:   (!) 127/56 135/75  Pulse: 70  70 68  Resp: 18  18 (!) 23  Temp:  98.5 F (36.9 C)    TempSrc:  Axillary    SpO2: 100%  100% 100%  Weight:    56.9 kg  Height:       Weight change: -4.3 kg  Intake/Output Summary (Last 24 hours) at 01/21/2022 0616 Last data filed at 01/21/2022 0500 Gross per 24 hour  Intake 628.09 ml  Output 2635 ml  Net -2006.91 ml    Dialysis Orders:  MWF  - [facility]  4hrs, BFR 400, DFR AF1.5,  EDW 54.5kg, 2K/ 2Ca   Access: AVF  Heparin 3000 unit bolus Calcitriol 0.5 mcg PO qHD Sensipar '120mg'$  qHD     Assessment/Plan: AMS - likely 2/2 bacteremia and PNA. S/p EEG with no seizure activity. Hx dementia.   Acute respiratory failure - intubated initially. Concern for aspiration on CT. Per CCM.  Able to be extubated 10/18 however required reintubation for cardiac arrest on 10/21.  Vent per critical care.  Optimize volume with HD as well.  S/p bronch with thick secretions Aspiration PNA - noted on CT. Abx per primary team.  Optimize volume status Bacteremia - BC +streptococcus pneumoniae. Abx per critical care ESRD -  On HD MWF schedule.  Hypotension - optimize volume with HD. He is on midodrine TID  Anemia of CKD - off of ESA now in setting of acute stroke.  No iron in setting of acute infection. (Note he did get aranesp 10/20 prior to that) Secondary Hyperparathyroidism -  pause calcitriol with hypercalcemia per corrected calcium.  No binders now  Acute  CVA - MRI with multiple infarcts c/w shower of microemboli.  Per primary team and neuro  CAD, CHB s/p PPM DMT2 - per primary team  COPD - noted decreased resp reserve  Disposition - in the ICU; per primary team   Labs: Basic Metabolic Panel: Recent Labs  Lab 01/19/22 0316 01/20/22 0813 01/21/22 0524  NA 141 136 137  K 3.8 4.2 3.9  CL 96* 98 95*  CO2 '28 26 29  '$ GLUCOSE 158* 254* 179*  BUN 38* 63* 44*  CREATININE 3.77* 5.11* 3.79*  CALCIUM 9.7 9.9 10.1  PHOS 2.6 3.4 3.1   Liver Function Tests: Recent Labs  Lab 01/19/22 0316 01/20/22 0813 01/21/22 0524  ALBUMIN 2.2* 2.1* 2.6*   No results for input(s): "LIPASE", "AMYLASE" in the last 168 hours. No results for input(s): "AMMONIA" in the last 168 hours. CBC: Recent Labs  Lab 01/15/22 0445 01/16/22 1309 01/18/22 0714 01/19/22 0316 01/20/22 0813 01/21/22 0524  WBC 6.2  --  8.6 14.0* 12.3* 9.8  NEUTROABS  --   --  6.7 11.0*  --   --   HGB 9.1*   < > 8.8* 10.0* 9.4* 8.8*  HCT 27.4*   < > 27.1* 31.0* 27.7* 26.3*  MCV 100.0  --  101.1* 100.6* 98.2  98.9  PLT 78*  --  89* 102* 128* 148*   < > = values in this interval not displayed.   Cardiac Enzymes: No results for input(s): "CKTOTAL", "CKMB", "CKMBINDEX", "TROPONINI" in the last 168 hours. CBG: Recent Labs  Lab 01/20/22 1220 01/20/22 1528 01/20/22 1922 01/20/22 2326 01/21/22 0335  GLUCAP 183* 224* 146* 207* 183*    Iron Studies: No results for input(s): "IRON", "TIBC", "TRANSFERRIN", "FERRITIN" in the last 72 hours. Studies/Results: DG Chest Port 1 View  Result Date: 01/20/2022 CLINICAL DATA:  Intubation.  Respiratory failure. EXAM: PORTABLE CHEST 1 VIEW COMPARISON:  01/19/2022 FINDINGS: Endotracheal tube tip is 3 cm above the carina. Soft feeding tube enters the abdomen. Dual lead pacemaker remains in place. Worsening of bilateral effusions and lower lung atelectasis/infiltrate. Upper lobes remain largely clear. IMPRESSION: Endotracheal tube tip 3 cm above  the carina. Worsening of bilateral effusions and lower lung atelectasis/infiltrate. Electronically Signed   By: Nelson Chimes M.D.   On: 01/20/2022 08:41   VAS US CAROTID (at [facility] and [facility] only)  Result Date: 01/20/2022 Carotid Arterial Duplex Study Patient Name:  Jeffrey Campbell  Date of Exam:   01/19/2022 Medical Rec #: [id number]      Accession #:    [id number] Date of Birth: 02-Aug-1932      Patient Gender: M Patient Age:   88 years Exam Location:  [facility] Procedure:      VAS US CAROTID Referring Phys: Beulah Gandy --------------------------------------------------------------------------------  Indications:       CVA. Risk Factors:      Hypertension, hyperlipidemia, past history of smoking,                    coronary artery disease. Comparison Study:  05/11/2019 carotid artery duplex- bilateral 1-39% carotid                    artery stenosis Performing Technologist: Maudry Mayhew MHA, RDMS, RVT, RDCS  Examination Guidelines: A complete evaluation includes B-mode imaging, spectral Doppler, color Doppler, and power Doppler as needed of all accessible portions of each vessel. Bilateral testing is considered an integral part of a complete examination. Limited examinations for reoccurring indications may be performed as noted.  Right Carotid Findings: +----------+--------+--------+--------+---------------------+------------------+           PSV cm/sEDV cm/sStenosisPlaque Description   Comments           +----------+--------+--------+--------+---------------------+------------------+ CCA Prox  82      11              irregular, focal and                                                      heterogenous                            +----------+--------+--------+--------+---------------------+------------------+ CCA Distal60      8                                    intimal thickening +----------+--------+--------+--------+---------------------+------------------+ ICA Prox  42       11              heterogenous and  Shadowing                                            calcific                                +----------+--------+--------+--------+---------------------+------------------+ ICA Distal52      14                                                      +----------+--------+--------+--------+---------------------+------------------+ ECA       66                      heterogenous,        shadowing                                            irregular and                                                             calcific                                +----------+--------+--------+--------+---------------------+------------------+ +----------+--------+-------+----------------+-------------------+           PSV cm/sEDV cmsDescribe        Arm Pressure (mmHG) +----------+--------+-------+----------------+-------------------+ [id number]            Multiphasic, WNL                    +----------+--------+-------+----------------+-------------------+ +---------+--------+--+--------+-+---------+ VertebralPSV cm/s40EDV cm/s7Antegrade +---------+--------+--+--------+-+---------+  Left Carotid Findings: +----------+--------+--------+--------+--------------------------+---------+           PSV cm/sEDV cm/sStenosisPlaque Description        Comments  +----------+--------+--------+--------+--------------------------+---------+ CCA Prox  83                                                          +----------+--------+--------+--------+--------------------------+---------+ CCA Distal76      7                                                   +----------+--------+--------+--------+--------------------------+---------+ ICA Prox  53      11              heterogenous and calcific Shadowing +----------+--------+--------+--------+--------------------------+---------+ ICA Distal50      11                                                   +----------+--------+--------+--------+--------------------------+---------+  ECA       74                      irregular and heterogenous          +----------+--------+--------+--------+--------------------------+---------+ +----------+--------+--------+----------------+-------------------+           PSV cm/sEDV cm/sDescribe        Arm Pressure (mmHG) +----------+--------+--------+----------------+-------------------+ Subclavian209             Multiphasic, WNL                    +----------+--------+--------+----------------+-------------------+ +---------+--------+--+--------+-+----------------------+ VertebralPSV cm/s31EDV cm/s6Monophasic, AVF in LUE +---------+--------+--+--------+-+----------------------+   Summary: Right Carotid: Velocities in the right ICA are consistent with a 1-39% stenosis. Left Carotid: Velocities in the left ICA are consistent with a 1-39% stenosis. Vertebrals:  Bilateral vertebral arteries demonstrate antegrade flow. Subclavians: Normal flow hemodynamics were seen in the right subclavian artery.              Left subclavian artery exhibits monophasic flow; AVF in left upper              extremity. *See table(s) above for measurements and observations.  Electronically signed by Deitra Mayo MD on 01/20/2022 at 6:17:44 AM.    Final    MR BRAIN WO CONTRAST  Result Date: 01/19/2022 CLINICAL DATA:  Left inferior cerebellar stroke shown by CT EXAM: MRI HEAD WITHOUT CONTRAST TECHNIQUE: Multiplanar, multiecho pulse sequences of the brain and surrounding structures were obtained without intravenous contrast. COMPARISON:  MR angiography same day. Head CT done 2 days ago. MRI 09/02/2018. FINDINGS: Brain: Diffusion imaging shows a punctate acute infarction at the anterior inferior cerebellum on the right. There is a 1.5 cm acute infarction at the inferior cerebellum on the left. Punctate acute infarction in the right pons. In the left  cerebral hemisphere, there is a punctate acute infarction at the left parietooccipital cortex, 2 punctate acute infarctions in the right thalamus, and a punctate acute infarction at the left posterior frontal vertex and left parietal vertex. In the right hemisphere, there are small grouping of punctate acute infarctions in the right posterior temporal lobe an additional punctate acute infarctions in the right occipital lobe, right caudate head, right medial thalamus, right caudate body and right frontoparietal vertex regions. Findings are likely represent a shower of micro emboli from the heart or ascending aorta. Confluent chronic small vessel ischemic changes are seen throughout the cerebral hemispheric white matter. There is no acute hemorrhage. No hydrocephalus. No extra-axial collection. Generalized brain volume loss is present. Vascular: Major vessels at the base of the brain show flow. Skull and upper cervical spine: Hypercellular marrow pattern in the cervical region. Sinuses/Orbits: Clear/normal Other: None IMPRESSION: 1. 1.5 cm acute infarction at the inferior cerebellum on the left. Punctate acute infarction at the anterior inferior cerebellum on the right. Punctate acute infarction in the right pons. 2. Punctate acute infarctions scattered throughout both cerebral hemispheres as above. Findings are consistent with a shower of micro emboli from the heart or ascending aorta. 3. Confluent chronic small-vessel ischemic changes throughout the hemispheric white matter. Generalized brain volume loss. Electronically Signed   By: Nelson Chimes M.D.   On: 01/19/2022 15:02   MR ANGIO HEAD WO CONTRAST  Result Date: 01/19/2022 CLINICAL DATA:  Neuro deficit, acute, stroke suspected. Small left inferior cerebellar infarction. EXAM: MRA HEAD WITHOUT CONTRAST TECHNIQUE: Angiographic images of the Circle of Willis were acquired using MRA technique without intravenous contrast. COMPARISON:  CT  01/17/2022.  MRI same  day. FINDINGS: Anterior circulation: Both internal carotid arteries are patent through the skull base and siphon regions. Pronounced atherosclerotic irregularity within the carotid siphon regions, possibly with atherosclerotic aneurysm formation, left more extensive than right. Severe stenosis of the left ICA at the proximal siphon and moderate stenosis of the right ICA at the proximal siphon. The anterior and middle cerebral vessels are patent. No large vessel occlusion or flow limiting proximal stenosis identified. No berry aneurysm. Posterior circulation: Both vertebral arteries are patent through the foramen magnum to the basilar artery. Moderate stenosis of the distal right vertebral artery just proximal to the basilar. No basilar stenosis. Flow is seen within the superior cerebellar and posterior cerebral arteries. There is a focal stenosis of the left P1 segment which could be flow limiting. Anatomic variants: None significant. Other: None. IMPRESSION: 1. No large vessel occlusion. 2. Atherosclerotic irregularity of the carotid siphon regions, left more extensive than right. Severe stenosis of the left ICA at the proximal siphon and moderate stenosis of the right ICA at the proximal siphon. Atherosclerotic aneurysm formation, left more extensive than right. This would probably be more accurately evaluated with CT angiography. 3. Focal stenosis of the left P1 segment which could be flow limiting. 4. Moderate stenosis of the distal right vertebral artery just proximal to the basilar. Electronically Signed   By: Nelson Chimes M.D.   On: 01/19/2022 14:57   DG CHEST PORT 1 VIEW  Result Date: 01/19/2022 CLINICAL DATA:  Ventilator dependence. EXAM: PORTABLE CHEST 1 VIEW COMPARISON:  01/17/2022 FINDINGS: 0823 hours. Endotracheal tube tip is 3.7 cm above the base of the carina. A feeding tube passes into the stomach although the distal tip position is not included on the film. The cardio pericardial silhouette is  enlarged. Right base collapse/consolidation is progressive in the interval with new 3 cm nodular component on today's exam. There is persistent retrocardiac left base collapse/consolidation with tiny left effusion. Interstitial markings are diffusely coarsened with chronic features. Telemetry leads overlie the chest. IMPRESSION: 1. Interval progression of right base collapse/consolidation with new 3 cm nodular component on today's exam. Close follow-up recommended. 2. Persistent retrocardiac left base collapse/consolidation with tiny left effusion. Electronically Signed   By: Misty Stanley M.D.   On: 01/19/2022 08:33   Medications: Infusions:  sodium chloride Stopped (01/18/22 1700)   cefTRIAXone (ROCEPHIN)  IV Stopped (01/20/22 1618)   feeding supplement (PEPTAMEN 1.5 CAL) 1,000 mL (01/20/22 1521)   heparin 950 Units/hr (01/21/22 0500)    Scheduled Medications:   stroke: early stages of recovery book   Does not apply Once   acetylcysteine  3 mL Nebulization TID   aspirin  81 mg Per Tube Daily   atorvastatin  20 mg Per Tube Daily   brinzolamide  1 drop Both Eyes BID   And   brimonidine  1 drop Both Eyes BID   calcitRIOL  0.5 mcg Per Tube Q M,W,F-HD   Chlorhexidine Gluconate Cloth  6 each Topical Q0600   Chlorhexidine Gluconate Cloth  6 each Topical Q0600   donepezil  10 mg Per Tube QHS   feeding supplement (PROSource TF20)  60 mL Per Tube Daily   fiber supplement (BANATROL TF)  60 mL Per Tube BID   insulin aspart  0-15 Units Subcutaneous Q4H   levothyroxine  150 mcg Per Tube Q0600   memantine  10 mg Per Tube BID   midodrine  5 mg Per Tube Q8H   multivitamin  1  tablet Per Tube QHS   mouth rinse  15 mL Mouth Rinse Q2H   pantoprazole  40 mg Per Tube Daily    have reviewed scheduled and prn medications.  Physical Exam:     General adult male in bed in no acute distress HEENT normocephalic atraumatic sclera anicteric Neck supple trachea midline Lungs coarse mechanical breath sounds  some rhonchi; no air hunger; FIO2 40 and peep 5 Heart S1S2 no rub Abdomen soft nontender nondistended Extremities no pitting edema  Psych no anxiety or agitation  Neuro - no sedation running. Tracks intermittently and moves hands slightly when I asked him to show me his thumb and to show two fingers but he does not successfully follow simple motor commands for me  Access: LUE AVF bruit and thrill    Claudia Desanctis, MD 01/21/2022,6:29 AM

## 2022-01-21 NOTE — Progress Notes (Signed)
NAME:  Jeffrey Campbell, MRN:  [id number], DOB:  11/05/1932, LOS: 9 ADMISSION DATE:  01/01/2022, CONSULTATION DATE:  01/11/2022 REFERRING MD:  EDP, CHIEF COMPLAINT:  AMS. ARF   History of Present Illness:  88 year old male with extensive PMH including ESRD on HD MWF. Presents to ED on 10/17 after being found unresponsive at home. Per family patient recent admission 8/23 for altered mental status, UTI. Now with reported tiredness, weakness over last 24 hours. This morning when daughter arrived at patient home, she reports patient was unresponsive making gurgling sounds, on arrival to ED patient remains minimally responsive with tachypnea and hypotension requiring intubation. CXR with left lower lobe infiltrate. CT head negative. CT C/A/P with extensive bilateral infiltirates and noted left lower lobe infiltrate concerning for aspiration. Given Cefepime/Vancomycin. Critical Care Consulted for admission.   Pertinent  Medical History  HTN, DMII, CAD, CHB h/o PPM, ESRD on HD MWF, COPD,   Significant Hospital Events: Including procedures, antibiotic start and stop dates in addition to other pertinent events   ETT 10/17-10/18 10/20 transferred out of ICU 10/21 PCCM reconsulted for complete whiteout of left lung, transferred to ICU and intubated, brief PEA arrest with ROSC in  4 minutes I/O placed 10/22 CT Head: Small acute infarct in the inferior left cerebellum 10/24 MRI brain: Acute infarct of the left and right cerebellum, right pons as well as multiple scattered infarcts in both cerebral hemisphere 10/24 MRA head: Severe stenosis of L ICA, moderate stenosis of R ICA and moderate stenosis of distal right vertebral artery 10/25 CXR worsening lower lung atelectasis/infiltrate 10/25 bronchoscopy w/ BAL  Interim History / Subjective:  No acute events overnight Awake and following commands  Objective   Blood pressure 135/75, pulse 68, temperature 98.5 F (36.9 C), temperature source Axillary, resp.  rate (!) 23, height _0  (1.753 m), weight 56.9 kg, SpO2 100 %.    Vent Mode: PRVC FiO2 (%):  [30 %-60 %] 30 % Set Rate:  [18 bmp] 18 bmp Vt Set:  [560 mL] 560 mL PEEP:  [5 cmH20] 5 cmH20 Plateau Pressure:  [14 cmH20-21 cmH20] 21 cmH20   Intake/Output Summary (Last 24 hours) at 01/21/2022 0715 Last data filed at 01/21/2022 0500 Gross per 24 hour  Intake 628.09 ml  Output 2635 ml  Net -2006.91 ml   Filed Weights   01/20/22 0730 01/20/22 1215 01/21/22 0500  Weight: 54.8 kg 57.9 kg 56.9 kg   Examination: General: Acutely ill elderly man laying in bed on vent. HENT: Vent in place.  [place]/AT. MMM Lungs: Remains on vent.  Rhonchorous lung sounds.  No WOB Cardiovascular: V-Paced-rhythm. No m/r/g.  Abdomen: Soft. NT/ND Extremities: Cool extremities.  Edematous BUEs.  Faint DP pulses. Neuro: Slightly more alert.  Follows commands.  Moving all extremities.  BC 9.8, Hgb 8.8, platelet 148 sCr 0.79, K+ 3.9, CBGs 180s to 190s Repeat CXR with no significant change in bibasilar airspace opacity.  Resolved Hospital Problem list   Septic shock  Assessment & Plan:  Septic Shock 2/2 Aspiration PNA S/p PEA cardiac arrest postintubation due to hypoxia, ROSC in 4 minutes Remained stable from sepsis standpoint.  Afebrile, leukocytosis has not resolved. BAL Gram stain with no organisms. -Continue midodrine -Continue Rocephin for 24 hours (day 9/10 of abx)  Acute hypoxic Respiratory Failure 2/2 to Aspiration Pneumonia and left-sided mucous plugging  S/p intubation and bronchoscopy on 10/21 and repeat bronchoscopy 10/25.  Currently on pressure support FiO2 down to 30%. -Trial SBT later today -Pulm  toilet, nebs and chest PT -Abx as above  Acute Left cerebellum infarct Acute septic encephalopathy  Postanoxic injury Advanced dementia CT Head on 10/22 showed small acute infarct in the inferior left cerebellum. Following commands today.  MRI brain shows multiple infarcts in the cerebellum, pons  and cerebral hemisphere.  MRA head showing severe stenosis of left ICA, moderate right ICA. MRI findings concerning for micro embolism from likely cardiac source in the setting of A-fib and severely dilated left and right atrium -Neurology following, appreciate recs -Continue IV heparin -PT recommending SNF -ASA, statin -[place] conversation with palliative care  ESRD on HD MWF Anemia of Chronic Disease Tolerated HD yesterday.  Continue HD on schedule. -Nephro following -Avoid nephrotoxic agents   Troponemia Hx of CHB s/p pacemaker Acute systolic CHF Atrial fibrillation TTE showed global hypokinesis with elevated Tropon 4,963. Demand ischemia in the setting of septic shock.  -Cardiology following at a distance -Holding GDMT -Continue medical mgmt w/ ASA, statin -Tele  Hypothyroidism -Continue levothyroxine   DMII CBGs in the 180s to 190s. -Continue SSI. Goal 140-180   Severe protein calorie malnutrition Continue Tube feeds  Best Practice (right click and "Reselect all SmartList Selections" daily)   Diet/type: tubefeeds and NPO DVT prophylaxis: prophylactic heparin  GI prophylaxis: PPI Lines: N/A Foley:  N/A Code Status:  full code Last date of multidisciplinary goals of care discussion [Pending]  Labs   CBC: Recent Labs  Lab 01/15/22 0445 01/16/22 1309 01/18/22 0714 01/19/22 0316 01/20/22 0813 01/21/22 0524  WBC 6.2  --  8.6 14.0* 12.3* 9.8  NEUTROABS  --   --  6.7 11.0*  --   --   HGB 9.1* 9.9* 8.8* 10.0* 9.4* 8.8*  HCT 27.4* 29.0* 27.1* 31.0* 27.7* 26.3*  MCV 100.0  --  101.1* 100.6* 98.2 98.9  PLT 78*  --  89* 102* 128* 148*    Basic Metabolic Panel: Recent Labs  Lab 01/15/22 0445 01/16/22 1309 01/17/22 0331 01/18/22 0714 01/19/22 0316 01/20/22 0813 01/21/22 0524  NA 138   < > 138 141 141 136 137  K 3.2*   < > 3.8 3.9 3.8 4.2 3.9  CL 96*  --  98 103 96* 98 95*  CO2 24  --  _0 GLUCOSE 111*  --  198* 190* 158* 254* 179*  BUN 35*   --  38* 58* 38* 63* 44*  CREATININE 4.55*  --  4.58* 5.73* 3.77* 5.11* 3.79*  CALCIUM 9.3  --  9.7 9.9 9.7 9.9 10.1  MG 2.2  --   --   --   --   --   --   PHOS 3.5  3.5  --  2.5 1.6* 2.6 3.4 3.1   < > = values in this interval not displayed.   GFR: Estimated Creatinine Clearance: 11.3 mL/min (A) (by C-G formula based on SCr of 3.79 mg/dL (H)). Recent Labs  Lab 01/18/22 0714 01/19/22 0316 01/20/22 0813 01/21/22 0524  WBC 8.6 14.0* 12.3* 9.8    Liver Function Tests: Recent Labs  Lab 01/17/22 0331 01/18/22 0714 01/19/22 0316 01/20/22 0813 01/21/22 0524  ALBUMIN 2.2* 2.1* 2.2* 2.1* 2.6*   No results for input(s): "LIPASE", "AMYLASE" in the last 168 hours. No results for input(s): "AMMONIA" in the last 168 hours.  ABG    Component Value Date/Time   PHART 7.497 (H) 01/16/2022 1309   PCO2ART 35.4 01/16/2022 1309   PO2ART 73 (L) 01/16/2022 1309   HCO3 27.5  01/16/2022 1309   TCO2 29 01/16/2022 1309   ACIDBASEDEF 5.0 (H) 07/28/2016 0850   ACIDBASEDEF 5.0 (H) 07/28/2016 0850   O2SAT 96 01/16/2022 1309     Coagulation Profile: No results for input(s): "INR", "PROTIME" in the last 168 hours.   Cardiac Enzymes: No results for input(s): "CKTOTAL", "CKMB", "CKMBINDEX", "TROPONINI" in the last 168 hours.  HbA1C: Hgb A1c MFr Bld  Date/Time Value Ref Range Status  12/27/2021 01:17 PM 5.2 4.8 - 5.6 % Final    Comment:    (NOTE) Pre diabetes:          5.7%-6.4%  Diabetes:              >6.4%  Glycemic control for   <7.0% adults with diabetes   09/19/2016 04:07 AM 5.5 4.8 - 5.6 % Final    Comment:    (NOTE)         Pre-diabetes: 5.7 - 6.4         Diabetes: >6.4         Glycemic control for adults with diabetes: <7.0     CBG: Recent Labs  Lab 01/20/22 1220 01/20/22 1528 01/20/22 1922 01/20/22 2326 01/21/22 0335  GLUCAP 183* 224* 146* 207* 183*    Review of Systems:   As in HPI  Past Medical History:  He,  has a past medical history of Anemia, BPH  (benign prostatic hyperplasia), CAD (coronary artery disease), Carotid artery stenosis, CKD (chronic kidney disease), stage III ([place]), Diastolic dysfunction, Glaucoma, Graves disease, History of ETOH abuse, Hyperlipemia, Hypertension, Hyperthyroidism (08/26/10), Memory loss, Mitral regurgitation (echo 2015), Multiple thyroid nodules, MVP (mitral valve prolapse) (11/2012), OSA (obstructive sleep apnea), Pre-diabetes, PUD (peptic ulcer disease), Pulmonary hypertension ([place]) (echo 2015), and Upper airway resistance syndrome.   Surgical History:   Past Surgical History:  Procedure Laterality Date   APPENDECTOMY     AV FISTULA PLACEMENT Left 10/18/2017   Procedure: ARTERIOVENOUS (AV) FISTULA CREATION ARM;  Surgeon: Waynetta Sandy, MD;  Location: [facility];  Service: Vascular;  Laterality: Left;   CARDIAC CATHETERIZATION     CARDIAC CATHETERIZATION N/A 02/17/2015   Procedure: Right Heart Cath;  Surgeon: Larey Dresser, MD;  Location: [place] [facility];  Service: Cardiovascular;  Laterality: N/A;   CARDIOVERSION N/A 02/08/2019   Procedure: CARDIOVERSION;  Surgeon: Donato Heinz, MD;  Location: [facility];  Service: Endoscopy;  Laterality: N/A;   ESOPHAGOGASTRODUODENOSCOPY (EGD) WITH PROPOFOL Left 10/16/2016   Procedure: ESOPHAGOGASTRODUODENOSCOPY (EGD) WITH PROPOFOL;  Surgeon: Ronnette Juniper, MD;  Location: [facility];  Service: Gastroenterology;  Laterality: Left;   heart catherization     HERNIA REPAIR  10/2009   IR RADIOLOGIST EVAL & MGMT  09/28/2016   IR RADIOLOGIST EVAL & MGMT  10/19/2016   IR RADIOLOGIST EVAL & MGMT  01/18/2017   PACEMAKER IMPLANT N/A 02/09/2019   Procedure: PACEMAKER IMPLANT;  Surgeon: Constance Haw, MD;  Location: [place] [facility] [facility];  Service: Cardiovascular;  Laterality: N/A;   RIGHT HEART CATH N/A 07/28/2016   Procedure: Right Heart Cath;  Surgeon: Larey Dresser, MD;  Location: [place] [facility];  Service: Cardiovascular;  Laterality: N/A;     Social  History:   reports that he quit smoking about 37 years ago. His smoking use included cigarettes. He has a 80.00 pack-year smoking history. He has never used smokeless tobacco. He reports that he does not drink alcohol and does not use drugs.   Family History:  His family history includes Colon  cancer in his brother, maternal uncle, and mother; Hypertension in his father; Kidney failure in his father; Lung disease in his brother.   Allergies Allergies  Allergen Reactions   Cardura [Doxazosin] Shortness Of Breath and Other (See Comments)    Dizziness    Hydrochlorothiazide Shortness Of Breath and Other (See Comments)    Dizziness    Iodinated Contrast Media Other (See Comments)    "shut down his kidneys" Patient has stage III chronic kidney disease   Zocor [Simvastatin] Other (See Comments)    Kidney problems    Protonix [Pantoprazole] Other (See Comments)    Worsening kidney problems   Rocaltrol [Calcitriol] Other (See Comments)    Unknown reaction   Budesonide-Formoterol Fumarate Other (See Comments)    Dry mouth   Minodyl [Minoxidil] Other (See Comments)    Unknown raction   Tiotropium Bromide Monohydrate Other (See Comments)    Dry mouth   Tricor [Fenofibrate]     Unknown reaction     Home Medications  Prior to Admission medications   Medication Sig Start Date End Date Taking? Authorizing Provider  atorvastatin (LIPITOR) 20 MG tablet TAKE 1 BY MOUTH DAILY ABSOLUTE LAST REFILL WITHOUT OFFICE VISIT PLEASE CALL [phone] TO SCHEDULE Patient taking differently: Take 20 mg by mouth daily. 11/09/21  Yes Larey Dresser, MD  Brinzolamide-Brimonidine [facility]) 1-0.2 % SUSP Place 1 drop into both eyes 2 (two) times daily.    Yes [provider]  calcium acetate (PHOSLO) 667 MG capsule Take 667 mg by mouth See admin instructions. 667 mg 3 times daily with meals, 667 mg 2 times daily with snacks. 11/30/19  Yes [provider]  donepezil (ARICEPT) 10 MG tablet Take 1  tablet (10 mg total) by mouth at bedtime. 08/17/21  Yes Suzzanne Cloud, NP  doxazosin (CARDURA) 4 MG tablet Take 1 tablet (4 mg total) by mouth daily. Patient taking differently: Take 4 mg by mouth 2 (two) times daily. 02/12/19  Yes Simmons, Brittainy M, PA-C  levothyroxine (SYNTHROID, LEVOTHROID) 150 MCG tablet Take 150 mcg by mouth daily before breakfast.   Yes [provider]  memantine (NAMENDA) 10 MG tablet TAKE 1 TABLET BY MOUTH TWICE A DAY Patient taking differently: Take 10 mg by mouth 2 (two) times daily. 01/19/21  Yes Suzzanne Cloud, NP  multivitamin (RENA-VIT) TABS tablet Take 1 tablet by mouth daily. 11/01/19  Yes [provider]     Critical care time: 30

## 2022-01-21 NOTE — Progress Notes (Signed)
STROKE TEAM PROGRESS NOTE   INTERVAL HISTORY  Patient is seen in his room with no family at the bedside.  Patient  appears   drowsy however can be aroused and follows commands and moves all 4 extremities purposefully.  Vital signs are stable.  Patient is being weaned off ventilatory support with plan to consider extubation later today.  Vitals:   01/21/22 0400 01/21/22 0500 01/21/22 0723 01/21/22 0733  BP: (!) 127/56 135/75 (!) 140/66   Pulse: 70 68 70   Resp: 18 (!) 23 18   Temp:    98.9 F (37.2 C)  TempSrc:    Oral  SpO2: 100% 100% 100%   Weight:  56.9 kg    Height:       CBC:  Recent Labs  Lab 01/18/22 0714 01/19/22 0316 01/20/22 0813 01/21/22 0524  WBC 8.6 14.0* 12.3* 9.8  NEUTROABS 6.7 11.0*  --   --   HGB 8.8* 10.0* 9.4* 8.8*  HCT 27.1* 31.0* 27.7* 26.3*  MCV 101.1* 100.6* 98.2 98.9  PLT 89* 102* 128* 148*    Basic Metabolic Panel:  Recent Labs  Lab 01/15/22 0445 01/16/22 1309 01/20/22 0813 01/21/22 0524  NA 138   < > 136 137  K 3.2*   < > 4.2 3.9  CL 96*   < > 98 95*  CO2 24   < > 26 29  GLUCOSE 111*   < > 254* 179*  BUN 35*   < > 63* 44*  CREATININE 4.55*   < > 5.11* 3.79*  CALCIUM 9.3   < > 9.9 10.1  MG 2.2  --   --   --   PHOS 3.5  3.5   < > 3.4 3.1   < > = values in this interval not displayed.    Lipid Panel:  Recent Labs  Lab 01/19/22 0316  CHOL 85  TRIG 50  HDL 34*  CHOLHDL 2.5  VLDL 10  LDLCALC 41    HgbA1c:  No results for input(s): "HGBA1C" in the last 168 hours.  Urine Drug Screen: No results for input(s): "LABOPIA", "COCAINSCRNUR", "LABBENZ", "AMPHETMU", "THCU", "LABBARB" in the last 168 hours.  Alcohol Level No results for input(s): "ETH" in the last 168 hours.  IMAGING past 24 hours DG CHEST PORT 1 VIEW  Result Date: 01/21/2022 CLINICAL DATA:  Aspiration pneumonia.  Intubated patient. EXAM: PORTABLE CHEST 1 VIEW COMPARISON:  Radiographs 01/20/2022, 01/19/2022 and 01/17/2022. CT 12/27/2021. FINDINGS: 0800 hours. The  positions of the endotracheal tube, feeding tube and right subclavian pacemaker leads have not significantly changed. The tip of the feeding tube is not visualized. The heart remains enlarged. There is aortic atherosclerosis. Bibasilar airspace opacities and small bilateral pleural effusions are unchanged. Possible increased density in the right perihilar region which could be due to overlying support apparatus. No evidence of pneumothorax or acute osseous abnormality. IMPRESSION: No significant change in bibasilar airspace opacities and small bilateral pleural effusions. Possible increased density in the right perihilar region which could be due to overlying support apparatus. Electronically Signed   By: Richardean Sale M.D.   On: 01/21/2022 08:41    PHYSICAL EXAM General:  Intubated frail elderly Asian patient in no acute distress. . Afebrile. Head is nontraumatic. Neck is supple without bruit.    Cardiac exam no murmur or gallop. Lungs are clear to auscultation. Distal pulses are well felt.  Respiratory: respiration synchronous with ventilator Neurological:Exam : Patient is intubated and sedated but can be aroused and  opens eyes partially.  PERRL, EOMI, blinks to threat on both sides, able to follow commands with all extremities and able to move them purposefully against gravity.  No focal weakness.  ASSESSMENT/PLAN Jeffrey Campbell is a 88 y.o. male with history of HTN, HLD, atrial fibrillation not on anticoagulation, SDH, CHF, COPD, OSA, PAD, CAN and ESRD on HD who was originally admitted with pneumococcus pneumonia, was intubated on 10/17 and extubated on 10/18, then developed respiratory distress on 10/21 and was reintubated.  He then had a brief PEA arrest with about 4 minutes of downtime before ROSC was achieved.  A head CT was repeated on 10/22, showing a small stroke in the left cerebellum.  Patient has been in atrial fibrillation but was not anticoagulated due to fall risk.  Stroke:  left  cerebellar stroke Etiology:  cardioembolic in setting of a-fib not on anticoagulation  CT head No acute abnormality. Small vessel disease. Atrophy. Small acute infarct in inferior left cerebellum MRI 1.5 cm acute infarction at the inferior cerebellum on the left. Punctate acute infarction at the anterior inferior cerebellum on the right. Punctate acute infarction in the right pons. Punctate acute infarctions scattered throughout both cerebral hemispheres as above MRA  Severe stenosis of the left ICA at the proximal siphon and moderate stenosis of the right ICA at the proximal siphon. Atherosclerotic aneurysm formation, left more extensive than right. Focal stenosis of the left P1 segment which could be flow limiting. Carotid Doppler  1-39% stenosis bilaterally, AVF in left upper extremity  2D Echo EF 45-50%. Grade 3 diastolic dysfunction, severely dilated left atrium, mitral and tricuspid regurgitation, no atrial level shunt LDL 41 HgbA1c 5.2 VTE prophylaxis - fully anticoagulated with heparin    Diet   Diet NPO time specified   No antithrombotic prior to admission, now on heparin IV for A-fib but not sure if he is a good long-term anticoagulation candidate Therapy recommendations:  pending Disposition:  pending  Atrial fibrillation Patient has history of a-fib, not anticoagulated due to fall risk and prior SDH Anticoagulate now with heparin IV Consider long term anticoagulation on discharge  Respiratory failure and pneumonia Patient reintubated 10/21 Ventilator management per CCM Antibiotics per CCM- on rocephin  Hypertension Home meds:  doxazosin 4 mg daily Stable Permissive hypertension (OK if < 220/120) but gradually normalize in 5-7 days Long-term BP goal normotensive  Hyperlipidemia Home meds:  atorvastatin 20 mg daily, resumed in hospital LDL 41, goal < 70 High intensity statin not indicated as LDL below goal Continue statin at discharge   Other Stroke Risk  Factors Advanced Age >/= 11  Former cigarette smoker Congestive heart failure  Other Active Problems Recent PEA arrest No evidence of hypoxic brain in jury seen on [facility] day # 9 I have personally obtained history,examined this patient, reviewed notes, independently viewed imaging studies, participated in medical decision making and plan of care.ROS completed by me personally and pertinent positives fully documented  I have made any additions or clarifications directly to the above note. Agree with note above.  Continue weaning off ventilatory support and extubate as tolerated per CCM.  Continue IV heparin for now.  Once patient is extubated will need to have family meeting to discuss goals of care and make a decision whether he needs to be on long-term anticoagulation or not.  Discussed with Dr.Olalere critical care medicine.This patient is critically ill and at significant risk of neurological worsening, death and care requires constant monitoring of vital signs,  hemodynamics,respiratory and cardiac monitoring, extensive review of multiple databases, frequent neurological assessment, discussion with family, other specialists and medical decision making of high complexity.I have made any additions or clarifications directly to the above note.This critical care time does not reflect procedure time, or teaching time or supervisory time of PA/NP/Med Resident etc but could involve care discussion time.  I spent 30 minutes of neurocritical care time  in the care of  this patient.      Antony Contras, MD Medical Director [facility] Pager: [phone] 01/21/2022 1:01 PM   To contact Stroke Continuity provider, please refer to [email or web address] After hours, contact General Neurology

## 2022-01-21 NOTE — Progress Notes (Signed)
Tolerating weaning well  Orders placed for extubation

## 2022-01-21 NOTE — Progress Notes (Signed)
ANTICOAGULATION CONSULT NOTE - Follow Up Consult  Pharmacy Consult for heparin Indication: atrial fibrillation in setting of CVA with multiple infarcts c/w shower of microemboli  Labs: Recent Labs    01/19/22 0316 01/19/22 1946 01/20/22 0813 01/20/22 1828 01/21/22 0524  HGB 10.0*  --  9.4*  --  8.8*  HCT 31.0*  --  27.7*  --  26.3*  PLT 102*  --  128*  --  148*  HEPARINUNFRC  --    < > <0.10* <0.10* <0.10*  CREATININE 3.77*  --  5.11*  --  3.79*   < > = values in this interval not displayed.    Assessment: 88yo male subtherapeutic on heparin after rate change; no infusion issues or signs of bleeding per RN.  Goal of Therapy:  Heparin level 0.3-0.5 units/ml   Plan:  Will increase heparin infusion by 3 units/kg/hr to 1100 units/hr and check level in 8 hours.    Wynona Neat, PharmD, BCPS  01/21/2022,7:24 AM

## 2022-01-21 NOTE — Procedures (Signed)
Extubation Procedure Note  Patient Details:   Name: Jeffrey Campbell DOB: Apr 01, 1932 MRN: [id number]   Airway Documentation:    Vent end date: 01/21/22 Vent end time: 1345   Evaluation  O2 sats: stable throughout and currently acceptable Complications: No apparent complications Patient did tolerate procedure well. Bilateral Breath Sounds: Clear, Diminished   Yes  Pt extubated to 3L [place] per MD order. Positive cuff leak noted prior to extubation. No stridor heard, pt having bloody oral secretions in [place], MD and RN notified. VS WNL.   Laymond Purser M 01/21/2022, 2:03 PM

## 2022-01-22 DIAGNOSIS — A419 Sepsis, unspecified organism: Secondary | ICD-10-CM | POA: Diagnosis not present

## 2022-01-22 DIAGNOSIS — G9340 Encephalopathy, unspecified: Secondary | ICD-10-CM | POA: Diagnosis not present

## 2022-01-22 DIAGNOSIS — I5021 Acute systolic (congestive) heart failure: Secondary | ICD-10-CM | POA: Diagnosis not present

## 2022-01-22 DIAGNOSIS — J9601 Acute respiratory failure with hypoxia: Secondary | ICD-10-CM | POA: Diagnosis not present

## 2022-01-22 LAB — CBC
HCT: 28.3 % — ABNORMAL LOW (ref 39.0–52.0)
Hemoglobin: 9.1 g/dL — ABNORMAL LOW (ref 13.0–17.0)
MCH: 32.5 pg (ref 26.0–34.0)
MCHC: 32.2 g/dL (ref 30.0–36.0)
MCV: 101.1 fL — ABNORMAL HIGH (ref 80.0–100.0)
Platelets: 192 10*3/uL (ref 150–400)
RBC: 2.8 MIL/uL — ABNORMAL LOW (ref 4.22–5.81)
RDW: 15.5 % (ref 11.5–15.5)
WBC: 11.3 10*3/uL — ABNORMAL HIGH (ref 4.0–10.5)
nRBC: 0 % (ref 0.0–0.2)

## 2022-01-22 LAB — GLUCOSE, CAPILLARY
Glucose-Capillary: 157 mg/dL — ABNORMAL HIGH (ref 70–99)
Glucose-Capillary: 159 mg/dL — ABNORMAL HIGH (ref 70–99)
Glucose-Capillary: 205 mg/dL — ABNORMAL HIGH (ref 70–99)
Glucose-Capillary: 195 mg/dL — ABNORMAL HIGH (ref 70–99)
Glucose-Capillary: 157 mg/dL — ABNORMAL HIGH (ref 70–99)
Glucose-Capillary: 180 mg/dL — ABNORMAL HIGH (ref 70–99)
Glucose-Capillary: 163 mg/dL — ABNORMAL HIGH (ref 70–99)

## 2022-01-22 LAB — RENAL FUNCTION PANEL
Albumin: 2.5 g/dL — ABNORMAL LOW (ref 3.5–5.0)
Anion gap: 13 (ref 5–15)
BUN: 62 mg/dL — ABNORMAL HIGH (ref 8–23)
CO2: 27 mmol/L (ref 22–32)
Calcium: 10 mg/dL (ref 8.9–10.3)
Chloride: 97 mmol/L — ABNORMAL LOW (ref 98–111)
Creatinine, Ser: 5.22 mg/dL — ABNORMAL HIGH (ref 0.61–1.24)
GFR, Estimated: 10 mL/min — ABNORMAL LOW (ref >60)
Glucose, Bld: 172 mg/dL — ABNORMAL HIGH (ref 70–99)
Phosphorus: 4.5 mg/dL (ref 2.5–4.6)
Potassium: 4.5 mmol/L (ref 3.5–5.1)
Sodium: 137 mmol/L (ref 135–145)

## 2022-01-22 LAB — CULTURE, RESPIRATORY W GRAM STAIN: Culture: NORMAL

## 2022-01-22 LAB — CYTOLOGY - NON PAP

## 2022-01-22 LAB — HEPARIN LEVEL (UNFRACTIONATED): Heparin Unfractionated: 0.1 IU/mL — ABNORMAL LOW (ref 0.30–0.70)

## 2022-01-22 MED ORDER — MIDODRINE HCL 5 MG PO TABS
10.0000 mg | ORAL_TABLET | Freq: Once | ORAL | Status: AC
  Administered 2022-01-22: 10 mg via ORAL
  Filled 2022-01-22: qty 2

## 2022-01-22 MED ORDER — INSULIN ASPART 100 UNIT/ML IJ SOLN
2.0000 [IU] | INTRAMUSCULAR | Status: DC
  Administered 2022-01-22 – 2022-01-30 (×37): 2 [IU] via SUBCUTANEOUS

## 2022-01-22 MED ORDER — ORAL CARE MOUTH RINSE
15.0000 mL | OROMUCOSAL | Status: DC
  Administered 2022-01-22 – 2022-01-25 (×12): 15 mL via OROMUCOSAL

## 2022-01-22 MED ORDER — ALBUMIN HUMAN 25 % IV SOLN
25.0000 g | Freq: Once | INTRAVENOUS | Status: AC
  Administered 2022-01-22: 25 g via INTRAVENOUS
  Filled 2022-01-22: qty 100

## 2022-01-22 MED ORDER — ORAL CARE MOUTH RINSE: 15.0000 mL | OROMUCOSAL | Status: DC | PRN

## 2022-01-22 NOTE — Progress Notes (Signed)
Occupational Therapy Treatment Patient Details Name: Jeffrey Campbell MRN: [id number] DOB: [birth year] Today's Date: 01/22/2022   History of present illness 88 y.o. male admitted 10/17 with AMS, hypotensive.  VDRF 10/17-10/18.  10/21 PCCM reconsulted for complete whiteout of left lung, transferred to ICU and intubated, brief PEA arrest with ROSC in 4 minutes. 10/22 CT with acute Lt cerebellar infarct.  PMHx: HTN, HLD, T2DM, hypothyroidism, COPD, OSA, CHF, PAD, CAD with complete heart block s/p PPM, mitral prolapse with mitral regurgitation, ESRD on HD MWF   OT comments  This 88 yo male seen today with PT due to prior to being transferred to ICU he was needing +2 A. He continues to need +2 A and did not A with any supine<>sit, held sitting balance (<10 seconds) at EOB, not following any commands. Sats also low on 7 liters of O2 (91%)He will continue to benefit from acute OT with follow up at SNF.    Recommendations for follow up therapy are one component of a multi-disciplinary discharge planning process, led by the attending physician.  Recommendations may be updated based on patient status, additional functional criteria and insurance authorization.    Follow Up Recommendations  Skilled nursing-short term rehab (<3 hours/day)    Assistance Recommended at Discharge Frequent or constant Supervision/Assistance  Patient can return home with the following  Two people to help with walking and/or transfers;Two people to help with bathing/dressing/bathroom;Assistance with cooking/housework;Assistance with feeding;Help with stairs or ramp for entrance;Assist for transportation;Direct supervision/assist for financial management;Direct supervision/assist for medications management   Equipment Recommendations  Other (comment) (TBD next venue)       Precautions / Restrictions Precautions Precautions: Fall Precaution Comments: flexi seal, cortrak Restrictions Weight Bearing Restrictions: No        Mobility Bed Mobility Overal bed mobility: Needs Assistance Bed Mobility: Supine to Sit, Sit to Supine   Sidelying to sit: Total assist, +2 for physical assistance Supine to sit: Total assist, +2 for physical assistance               Balance Overall balance assessment: Needs assistance Sitting-balance support: No upper extremity supported, Feet supported   Sitting balance - Comments: poor to zero with with right and left lateral lean when he would lose balance without attempt to correct                                   ADL either performed or assessed with clinical judgement   ADL                                         General ADL Comments: total A bedlevel and sitting EOB    Extremity/Trunk Assessment Upper Extremity Assessment Upper Extremity Assessment: LUE deficits/detail RUE Deficits / Details: RUE still edematous compared to LUE, with attempt at PROM pt tenses up arm, did squeeze my hand (most likely a reflex due to did not release his grip on command) LUE Deficits / Details: with attempt at PROM pt tenses up arm, did squeeze my hand (most likely a reflex due to did not release his grip on command)            Vision   Vision Assessment?: Vision impaired- to be further tested in functional context Additional Comments: right downward gaze preference with eyes closed 90% of time  Cognition Arousal/Alertness: Lethargic Behavior During Therapy: Flat affect Overall Cognitive Status: Impaired/Different from baseline Area of Impairment: Following commands, Attention, Safety/judgement, Awareness, Problem solving                   Current Attention Level: Focused   Following Commands:  (not following any one step commands) Safety/Judgement: Decreased awareness of safety, Decreased awareness of deficits Awareness: Intellectual Problem Solving: Slow processing, Decreased initiation, Requires verbal cues, Requires  tactile cues, Difficulty sequencing General Comments: head turned and downward tilted to left, eyes closed 90% of sessoin                   Pertinent Vitals/ Pain       Pain Assessment Pain Assessment: PAINAD Breathing: normal Negative Vocalization: occasional moan/groan, low speech, negative/disapproving quality Facial Expression: smiling or inexpressive Body Language: tense, distressed pacing, fidgeting Consolability: no need to console PAINAD Score: 2 Pain Location: when trying to do PROM at his neck Pain Descriptors / Indicators: Grimacing, Guarding, Moaning Pain Intervention(s): Limited activity within patient's tolerance, Monitored during session, Repositioned         Frequency  Min 2X/week        Progress Toward Goals  OT Goals(current goals can now be found in the care plan section)  Progress towards OT goals: Not progressing toward goals - comment (lethargic, fatigued (did have dialysis earlier today))  Acute Rehab OT Goals Patient Stated Goal: uable to state OT Goal Formulation: Patient unable to participate in goal setting Time For Goal Achievement: 02/02/22 Potential to Achieve Goals: [place] Discharge plan remains appropriate    Co-evaluation    PT/OT/SLP Co-Evaluation/Treatment: Yes Reason for Co-Treatment: Necessary to address cognition/behavior during functional activity;For patient/therapist safety PT goals addressed during session: Mobility/safety with mobility;Balance;Strengthening/ROM OT goals addressed during session: Strengthening/ROM      AM-PAC OT "6 Clicks" Daily Activity     Outcome Measure   Help from another person eating meals?: Total Help from another person taking care of personal grooming?: Total Help from another person toileting, which includes using toliet, bedpan, or urinal?: Total Help from another person bathing (including washing, rinsing, drying)?: Total Help from another person to put on and taking off regular  upper body clothing?: Total Help from another person to put on and taking off regular lower body clothing?: Total 6 Click Score: 6    End of Session Equipment Utilized During Treatment: Oxygen (2 Liters to begin  up to 7 liters by MD at end (sats in high 80's to low 90's))  OT Visit Diagnosis: Other abnormalities of gait and mobility (R26.89);Muscle weakness (generalized) (M62.81);Low vision, both eyes (H54.2);Other symptoms and signs involving cognitive function;Adult, failure to thrive (R62.7);Hemiplegia and hemiparesis Hemiplegia - Right/Left:  (appears both)   Activity Tolerance Patient limited by lethargy;Patient limited by fatigue   Patient Left in bed;with call bell/phone within reach;with bed alarm set (in chair position)   Nurse Communication  (sats dropping on 2 and 4 liters with good waveform, MD in room with RN and turned it up to 7 liters with pateint only getting up to 91%)        Time: 9675-9163 OT Time Calculation (min): 28 min  Charges: OT General Charges $OT Visit: 1 Visit OT Treatments $Therapeutic Activity: 8-22 mins  [place], OTR/L [facility] [phone] Office [phone]    Almon Register 01/22/2022, 3:20 PM

## 2022-01-22 NOTE — Progress Notes (Signed)
NAME:  Jeffrey Campbell, MRN:  [id number], DOB:  09/04/1932, LOS: 10 ADMISSION DATE:  01/15/2022, CONSULTATION DATE:  12/31/2021 REFERRING MD:  EDP, CHIEF COMPLAINT:  AMS. ARF   History of Present Illness:  88 year old male with extensive PMH including ESRD on HD MWF. Presents to ED on 10/17 after being found unresponsive at home. Per family patient recent admission 8/23 for altered mental status, UTI. Now with reported tiredness, weakness over last 24 hours. This morning when daughter arrived at patient home, she reports patient was unresponsive making gurgling sounds, on arrival to ED patient remains minimally responsive with tachypnea and hypotension requiring intubation. CXR with left lower lobe infiltrate. CT head negative. CT C/A/P with extensive bilateral infiltirates and noted left lower lobe infiltrate concerning for aspiration. Given Cefepime/Vancomycin. Critical Care Consulted for admission.   Pertinent  Medical History  HTN, DMII, CAD, CHB h/o PPM, ESRD on HD MWF, COPD,   Significant Hospital Events: Including procedures, antibiotic start and stop dates in addition to other pertinent events   ETT 10/17-10/18 10/20 transferred out of ICU 10/21 PCCM reconsulted for complete whiteout of left lung, transferred to ICU and intubated, brief PEA arrest with ROSC in  4 minutes I/O placed 10/22 CT Head: Small acute infarct in the inferior left cerebellum 10/24 MRI brain: Acute infarct of the left and right cerebellum, right pons as well as multiple scattered infarcts in both cerebral hemisphere 10/24 MRA head: Severe stenosis of L ICA, moderate stenosis of R ICA and moderate stenosis of distal right vertebral artery 10/25 CXR worsening lower lung atelectasis/infiltrate 10/25 bronchoscopy w/ BAL 10/26 extubated to nasal cannula  Interim History / Subjective:  No acute events overnight. Patient's remain awake and following commands On 3 L [place]  Objective   Blood pressure (!) 113/52, pulse 70,  temperature 97.8 F (36.6 C), resp. rate (!) 28, height _0  (1.753 m), weight 57.7 kg, SpO2 (!) 89 %.    Vent Mode: CPAP;PSV FiO2 (%):  [30 %] 30 % PEEP:  [5 cmH20] 5 cmH20 Pressure Support:  [10 cmH20] 10 cmH20   Intake/Output Summary (Last 24 hours) at 01/22/2022 0909 Last data filed at 01/22/2022 0600 Gross per 24 hour  Intake 583.62 ml  Output 650 ml  Net -66.38 ml   Filed Weights   01/21/22 0500 01/22/22 0500 01/22/22 0715  Weight: 56.9 kg 57.8 kg 57.7 kg   Examination: General: Chronically ill elderly man laying in bed.  NAD. HENT: Vent in place.  [place]/AT. MMM Lungs: Congested anterior lung sounds. Cardiovascular: V-Paced-rhythm. No m/r/g.  Abdomen: Soft.  NT/ND.  Normal bowel sounds. Extremities: Cool extremities.  Edematous BUEs.  Faint DP pulses. Neuro: Alert and awake.  Moving all extremities.  Able to follow commands.  WBC 11.3, Hgb 9.1, platelet 192 K+ 4.5, creatinine 5.22 CBGs 150s to 160s   Resolved Hospital Problem list   Septic shock  Assessment & Plan:  Septic Shock 2/2 Aspiration PNA S/p PEA cardiac arrest postintubation due to hypoxia, ROSC in 4 minutes He remains afebrile.  Leukocytosis slightly trended up to 11.3 but overall stable.  Has completed 10-day course of antibiotics.  SBP has been stable off midodrine.  -Off midodrine -Trend WBC/fever curve  Acute hypoxic Respiratory Failure 2/2 to Aspiration Pneumonia and left-sided mucous plugging  S/p intubation and bronchoscopy on 10/21 and repeat bronchoscopy 10/25.  Extubated on 10/26 to [place]. Respiratory status remained stable.  Weaned down to 2 L this morning.  -Continue O2 supplementation -Continue  nebs -Can transfer out of the ICU over the weekend.  Acute cerebellar, pons and cerebral infarct Acute septic encephalopathy  Postanoxic injury Advanced dementia CT Head on 10/22 showed small acute infarct in the inferior left cerebellum. Following commands today.  MRI brain shows multiple infarcts  in the cerebellum, pons and cerebral hemisphere. MRA head showing severe stenosis of left ICA, moderate right ICA. MRI findings concerning for micro embolism from likely cardiac source in the setting of A-fib and severely dilated left and right atrium.  -Neurology following, appreciate recs -Continue IV heparin -PT recommending SNF -ASA, statin -Plan to discuss risk and benefits of long-term anticoagulation with family  ESRD on HD MWF Anemia of Chronic Disease Kidney function remained stable without electrolyte abnormalities.  Plan for HD today.  -Nephro following -Avoid nephrotoxic agents   Troponemia Hx of CHB s/p pacemaker Acute systolic CHF Atrial fibrillation TTE showed global hypokinesis with elevated Tropon 4,963. Demand ischemia in the setting of septic shock.  -Cardiology following at a distance -Holding GDMT -Continue medical mgmt w/ ASA, statin -Tele  Hypothyroidism -Continue levothyroxine   DMII CBGs 150s to 170s. -Continue SSI. Goal 140-180   Severe protein calorie malnutrition Continue Tube feeds  Best Practice (right click and "Reselect all SmartList Selections" daily)   Diet/type: tubefeeds and NPO DVT prophylaxis: prophylactic heparin  GI prophylaxis: PPI Lines: N/A Foley:  N/A Code Status:  full code Last date of multidisciplinary goals of care discussion [Pending]  Labs   CBC: Recent Labs  Lab 01/18/22 0714 01/19/22 0316 01/20/22 0813 01/21/22 0524 01/22/22 0208  WBC 8.6 14.0* 12.3* 9.8 11.3*  NEUTROABS 6.7 11.0*  --   --   --   HGB 8.8* 10.0* 9.4* 8.8* 9.1*  HCT 27.1* 31.0* 27.7* 26.3* 28.3*  MCV 101.1* 100.6* 98.2 98.9 101.1*  PLT 89* 102* 128* 148* 956    Basic Metabolic Panel: Recent Labs  Lab 01/18/22 0714 01/19/22 0316 01/20/22 0813 01/21/22 0524 01/22/22 0208  NA 141 141 136 137 137  K 3.9 3.8 4.2 3.9 4.5  CL 103 96* 98 95* 97*  CO2 _0 GLUCOSE 190* 158* 254* 179* 172*  BUN 58* 38* 63* 44* 62*  CREATININE  5.73* 3.77* 5.11* 3.79* 5.22*  CALCIUM 9.9 9.7 9.9 10.1 10.0  PHOS 1.6* 2.6 3.4 3.1 4.5   GFR: Estimated Creatinine Clearance: 8.3 mL/min (A) (by C-G formula based on SCr of 5.22 mg/dL (H)). Recent Labs  Lab 01/19/22 0316 01/20/22 0813 01/21/22 0524 01/22/22 0208  WBC 14.0* 12.3* 9.8 11.3*    Liver Function Tests: Recent Labs  Lab 01/18/22 0714 01/19/22 0316 01/20/22 0813 01/21/22 0524 01/22/22 0208  ALBUMIN 2.1* 2.2* 2.1* 2.6* 2.5*   No results for input(s): "LIPASE", "AMYLASE" in the last 168 hours. No results for input(s): "AMMONIA" in the last 168 hours.  ABG    Component Value Date/Time   PHART 7.497 (H) 01/16/2022 1309   PCO2ART 35.4 01/16/2022 1309   PO2ART 73 (L) 01/16/2022 1309   HCO3 27.5 01/16/2022 1309   TCO2 29 01/16/2022 1309   ACIDBASEDEF 5.0 (H) 07/28/2016 0850   ACIDBASEDEF 5.0 (H) 07/28/2016 0850   O2SAT 96 01/16/2022 1309     Coagulation Profile: No results for input(s): "INR", "PROTIME" in the last 168 hours.   Cardiac Enzymes: No results for input(s): "CKTOTAL", "CKMB", "CKMBINDEX", "TROPONINI" in the last 168 hours.  HbA1C: Hgb A1c MFr Bld  Date/Time Value Ref Range Status  01/17/2022 01:17 PM 5.2  4.8 - 5.6 % Final    Comment:    (NOTE) Pre diabetes:          5.7%-6.4%  Diabetes:              >6.4%  Glycemic control for   <7.0% adults with diabetes   09/19/2016 04:07 AM 5.5 4.8 - 5.6 % Final    Comment:    (NOTE)         Pre-diabetes: 5.7 - 6.4         Diabetes: >6.4         Glycemic control for adults with diabetes: <7.0     CBG: Recent Labs  Lab 01/21/22 1620 01/21/22 1940 01/21/22 2322 01/22/22 0327 01/22/22 0731  GLUCAP 131* 160* 178* 157* 159*    Review of Systems:   As in HPI  Past Medical History:  He,  has a past medical history of Anemia, BPH (benign prostatic hyperplasia), CAD (coronary artery disease), Carotid artery stenosis, CKD (chronic kidney disease), stage III ([place]), Diastolic dysfunction,  Glaucoma, Graves disease, History of ETOH abuse, Hyperlipemia, Hypertension, Hyperthyroidism (08/26/10), Memory loss, Mitral regurgitation (echo 2015), Multiple thyroid nodules, MVP (mitral valve prolapse) (11/2012), OSA (obstructive sleep apnea), Pre-diabetes, PUD (peptic ulcer disease), Pulmonary hypertension ([place]) (echo 2015), and Upper airway resistance syndrome.   Surgical History:   Past Surgical History:  Procedure Laterality Date   APPENDECTOMY     AV FISTULA PLACEMENT Left 10/18/2017   Procedure: ARTERIOVENOUS (AV) FISTULA CREATION ARM;  Surgeon: Waynetta Sandy, MD;  Location: [place] [facility];  Service: Vascular;  Laterality: Left;   CARDIAC CATHETERIZATION     CARDIAC CATHETERIZATION N/A 02/17/2015   Procedure: Right Heart Cath;  Surgeon: Larey Dresser, MD;  Location: [facility];  Service: Cardiovascular;  Laterality: N/A;   CARDIOVERSION N/A 02/08/2019   Procedure: CARDIOVERSION;  Surgeon: Donato Heinz, MD;  Location: [place];  Service: Endoscopy;  Laterality: N/A;   ESOPHAGOGASTRODUODENOSCOPY (EGD) WITH PROPOFOL Left 10/16/2016   Procedure: ESOPHAGOGASTRODUODENOSCOPY (EGD) WITH PROPOFOL;  Surgeon: Ronnette Juniper, MD;  Location: [facility];  Service: Gastroenterology;  Laterality: Left;   heart catherization     HERNIA REPAIR  10/2009   IR RADIOLOGIST EVAL & MGMT  09/28/2016   IR RADIOLOGIST EVAL & MGMT  10/19/2016   IR RADIOLOGIST EVAL & MGMT  01/18/2017   PACEMAKER IMPLANT N/A 02/09/2019   Procedure: PACEMAKER IMPLANT;  Surgeon: Constance Haw, MD;  Location: [facility];  Service: Cardiovascular;  Laterality: N/A;   RIGHT HEART CATH N/A 07/28/2016   Procedure: Right Heart Cath;  Surgeon: Larey Dresser, MD;  Location: [facility];  Service: Cardiovascular;  Laterality: N/A;     Social History:   reports that he quit smoking about 37 years ago. His smoking use included cigarettes. He has a 80.00 pack-year smoking history. He has never used  smokeless tobacco. He reports that he does not drink alcohol and does not use drugs.   Family History:  His family history includes Colon cancer in his brother, maternal uncle, and mother; Hypertension in his father; Kidney failure in his father; Lung disease in his brother.   Allergies Allergies  Allergen Reactions   Cardura [Doxazosin] Shortness Of Breath and Other (See Comments)    Dizziness    Hydrochlorothiazide Shortness Of Breath and Other (See Comments)    Dizziness    Iodinated Contrast Media Other (See Comments)    "shut down his kidneys" Patient has stage III  chronic kidney disease   Zocor [Simvastatin] Other (See Comments)    Kidney problems    Protonix [Pantoprazole] Other (See Comments)    Worsening kidney problems   Rocaltrol [Calcitriol] Other (See Comments)    Unknown reaction   Budesonide-Formoterol Fumarate Other (See Comments)    Dry mouth   Minodyl [Minoxidil] Other (See Comments)    Unknown raction   Tiotropium Bromide Monohydrate Other (See Comments)    Dry mouth   Tricor [Fenofibrate]     Unknown reaction     Home Medications  Prior to Admission medications   Medication Sig Start Date End Date Taking? Authorizing Provider  atorvastatin (LIPITOR) 20 MG tablet TAKE 1 BY MOUTH DAILY ABSOLUTE LAST REFILL WITHOUT OFFICE VISIT PLEASE CALL [phone] TO SCHEDULE Patient taking differently: Take 20 mg by mouth daily. 11/09/21  Yes Larey Dresser, MD  Brinzolamide-Brimonidine [facility]) 1-0.2 % SUSP Place 1 drop into both eyes 2 (two) times daily.    Yes [provider]  calcium acetate (PHOSLO) 667 MG capsule Take 667 mg by mouth See admin instructions. 667 mg 3 times daily with meals, 667 mg 2 times daily with snacks. 11/30/19  Yes [provider]  donepezil (ARICEPT) 10 MG tablet Take 1 tablet (10 mg total) by mouth at bedtime. 08/17/21  Yes Suzzanne Cloud, NP  doxazosin (CARDURA) 4 MG tablet Take 1 tablet (4 mg total) by mouth  daily. Patient taking differently: Take 4 mg by mouth 2 (two) times daily. 02/12/19  Yes Simmons, Brittainy M, PA-C  levothyroxine (SYNTHROID, LEVOTHROID) 150 MCG tablet Take 150 mcg by mouth daily before breakfast.   Yes [provider]  memantine (NAMENDA) 10 MG tablet TAKE 1 TABLET BY MOUTH TWICE A DAY Patient taking differently: Take 10 mg by mouth 2 (two) times daily. 01/19/21  Yes Suzzanne Cloud, NP  multivitamin (RENA-VIT) TABS tablet Take 1 tablet by mouth daily. 11/01/19  Yes [provider]     Critical care time: 43

## 2022-01-22 NOTE — Progress Notes (Signed)
[facility] for Heparin Indication: atrial fibrillation  Allergies  Allergen Reactions   Cardura [Doxazosin] Shortness Of Breath and Other (See Comments)    Dizziness    Hydrochlorothiazide Shortness Of Breath and Other (See Comments)    Dizziness    Iodinated Contrast Media Other (See Comments)    "shut down his kidneys" Patient has stage III chronic kidney disease   Zocor [Simvastatin] Other (See Comments)    Kidney problems    Protonix [Pantoprazole] Other (See Comments)    Worsening kidney problems   Rocaltrol [Calcitriol] Other (See Comments)    Unknown reaction   Budesonide-Formoterol Fumarate Other (See Comments)    Dry mouth   Minodyl [Minoxidil] Other (See Comments)    Unknown raction   Tiotropium Bromide Monohydrate Other (See Comments)    Dry mouth   Tricor [Fenofibrate]     Unknown reaction    Patient Measurements: Height: '5\' 9"'$  (175.3 cm) Weight: 56.9 kg (125 lb 7.1 oz) IBW/kg (Calculated) : 70.7 Heparin Dosing Weight: 59 kg   Vital Signs: Temp: 98.6 F (37 C) (10/26 2324) Temp Source: Axillary (10/26 2324) BP: 135/59 (10/27 0100) Pulse Rate: 68 (10/27 0100)  Labs: Recent Labs    01/20/22 0813 01/20/22 1828 01/21/22 0524 01/21/22 1433 01/22/22 0208  HGB 9.4*  --  8.8*  --  9.1*  HCT 27.7*  --  26.3*  --  28.3*  PLT 128*  --  148*  --  192  HEPARINUNFRC <0.10*   < > <0.10* <0.10* 0.10*  CREATININE 5.11*  --  3.79*  --  5.22*   < > = values in this interval not displayed.     Estimated Creatinine Clearance: 8.2 mL/min (A) (by C-G formula based on SCr of 5.22 mg/dL (H)).  Assessment: 88 yo male admitted 01/03/2022 found to have strep pneumonia bacteremia and PNA. Now found to have small infarct in L cerebellum. Pharmacy consulted for IV heparin. No bolus, low heparin level goal given CVA.  Heparin level remains low at  0.10 after increase to 1250 units/hr. No signs of bleeding reported, no report of infusion  issues.  H/H is low but stable.  Platelets have improved to WNL  Goal of Therapy:  Heparin level 0.3-0.5 units/ml Monitor platelets by anticoagulation protocol: Yes   Plan:  Increase heparin to 1400 units/hr - no bolus given CVA Recheck 8hr heparin level  Monitor heparin level, CBC and s/s of bleeding daily  Watch plts closely   Georga Bora, PharmD Clinical Pharmacist 01/22/2022 3:25 AM Please check AMION for all [place] numbers

## 2022-01-22 NOTE — Progress Notes (Addendum)
STROKE TEAM PROGRESS NOTE   INTERVAL HISTORY Patient is seen in his room with no family at the bedside.  Patient is encephalopathic on exam this morning with HD in process at bedside, extubated 10/26.  Due to patient's functional status, it is felt that full dose anticoagulation is not appropriate for this patient. Discussed with primary team at bedside.  I also spoke to the patient's daughter over the phone and informed me that he was taken off anticoagulation by his nephrologist Dr. Jimmy Footman before he went on dialysis likely due to his underlying dementia as well as generalized weakness and fall risk Vitals:   01/22/22 0732 01/22/22 0745 01/22/22 0753 01/22/22 0800  BP:  (!) 141/66  138/64  Pulse:  69  70  Resp:  (!) 26  (!) 24  Temp: 97.8 F (36.6 C)     TempSrc:      SpO2:  100% 98% 99%  Weight:      Height:       CBC:  Recent Labs  Lab 01/18/22 0714 01/19/22 0316 01/20/22 0813 01/21/22 0524 01/22/22 0208  WBC 8.6 14.0*   < > 9.8 11.3*  NEUTROABS 6.7 11.0*  --   --   --   HGB 8.8* 10.0*   < > 8.8* 9.1*  HCT 27.1* 31.0*   < > 26.3* 28.3*  MCV 101.1* 100.6*   < > 98.9 101.1*  PLT 89* 102*   < > 148* 192   < > = values in this interval not displayed.   Basic Metabolic Panel:  Recent Labs  Lab 01/21/22 0524 01/22/22 0208  NA 137 137  K 3.9 4.5  CL 95* 97*  CO2 29 27  GLUCOSE 179* 172*  BUN 44* 62*  CREATININE 3.79* 5.22*  CALCIUM 10.1 10.0  PHOS 3.1 4.5   Lipid Panel:  Recent Labs  Lab 01/19/22 0316  CHOL 85  TRIG 50  HDL 34*  CHOLHDL 2.5  VLDL 10  LDLCALC 41   HgbA1c:  No results for input(s): "HGBA1C" in the last 168 hours.  Urine Drug Screen: No results for input(s): "LABOPIA", "COCAINSCRNUR", "LABBENZ", "AMPHETMU", "THCU", "LABBARB" in the last 168 hours.  Alcohol Level No results for input(s): "ETH" in the last 168 hours.  IMAGING past 24 hours No results found.  PHYSICAL EXAM General:   frail elderly African American patient in no acute  distress. Afebrile. Head is nontraumatic. Neck is supple without bruit.  Cardiac exam no murmur or gallop. Lungs are clear to auscultation. Distal pulses are well felt.  Respiratory: respirations spontaneous and non-labored on nasal cannula  Neurological Exam : Patient is sitting up in bed with eyes open. Minimal moaning vocalizations during exam. He does   follow midline and few extremity commands, does not answer orientation questions.  PERRL, tracks examiner, blinks to threat throughout. Withdraws to noxious stimuli throughout without overt asymmetry.  Does not participate with coordination testing.  Asterixis present bilateral upper extremities. ASSESSMENT/PLAN Jeffrey Campbell is a 88 y.o. male with history of HTN, HLD, atrial fibrillation not on anticoagulation, SDH, CHF, COPD, OSA, PAD, CAN and ESRD on HD who was originally admitted with pneumococcus pneumonia, was intubated on 10/17 and extubated on 10/18, then developed respiratory distress on 10/21 and was reintubated.  He then had a brief PEA arrest with about 4 minutes of downtime before ROSC was achieved.  A head CT was repeated on 10/22, showing a small stroke in the left cerebellum.  Patient has been  in atrial fibrillation but was not anticoagulated due to fall risk.  Stroke:  left cerebellar stroke Etiology:  cardioembolic in setting of a-fib not on anticoagulation due to history of dementia, fall risk and generalized weakness CT head No acute abnormality. Small vessel disease. Atrophy. Small acute infarct in inferior left cerebellum MRI 1.5 cm acute infarction at the inferior cerebellum on the left. Punctate acute infarction at the anterior inferior cerebellum on the right. Punctate acute infarction in the right pons. Punctate acute infarctions scattered throughout both cerebral hemispheres as above MRA Severe stenosis of the left ICA at the proximal siphon and moderate stenosis of the right ICA at the proximal siphon. Atherosclerotic  aneurysm formation, left more extensive than right. Focal stenosis of the left P1 segment which could be flow limiting. Carotid Doppler  1-39% stenosis bilaterally, AVF in left upper extremity  2D Echo EF 45-50%. Grade 3 diastolic dysfunction, severely dilated left atrium, mitral and tricuspid regurgitation, no atrial level shunt LDL 41 HgbA1c 5.2 VTE prophylaxis - previously fully anticoagulated with heparin, discontinued today. Due to risks of bleeding and patient's functional status, he is not a candidate for ongoing full dose anticoagulation. SCDs.     Diet   Diet NPO time specified   No antithrombotic prior to admission, now on heparin IV for A-fib but patient is not a good long-term anticoagulation candidate, therefore will continue ASA 81 mg PO daily and discontinue heparin.  Therapy recommendations:  pending Disposition:  pending  Atrial fibrillation Patient has history of a-fib, not anticoagulated due to fall risk and prior SDH Not a good candidate for long term anticoagulation due to baseline status, fall risk.  Patient will remain on ASA 81 mg PO daily but will not escalate therapy due to risk of falls   Respiratory failure and pneumonia Extubated 10/26 per CCM to nasal cannula Antibiotics per CCM- on rocephin  Hypertension Home meds:  doxazosin 4 mg daily Stable Permissive hypertension (OK if < 220/120) but gradually normalize in 5-7 days Long-term BP goal normotensive  Hyperlipidemia Home meds:  atorvastatin 20 mg daily, resumed in hospital LDL 41, goal < 70 High intensity statin not indicated as LDL below goal Continue statin at discharge  Other Stroke Risk Factors Advanced Age >/= 56  Former cigarette smoker Congestive heart failure  Other Active Problems Recent PEA arrest No evidence of hypoxic brain in jury seen on [facility] day # 10  -- Anibal Henderson, AGACNP-BC [facility] [phone]  I have personally obtained history,examined  this patient, reviewed notes, independently viewed imaging studies, participated in medical decision making and plan of care.ROS completed by me personally and pertinent positives fully documented  I have made any additions or clarifications directly to the above note. Agree with note above.  Patient is now extubated but remains quite encephalopathic and weak all over.  Asterixis.  I had a long discussion over the phone with the patient's daughter and he apparently was taken off anticoagulation by his nephrologist mainly due to combinationof dementia, fall risk and generalized weakness was felt to be too high risk for bleeding.  I agree with that decision and given his new strokes is likely going to be running high risk for falling and cognition also likely will get worse.  Recommend continue aspirin alone and discontinue IV heparin.  Long discussion with patient as well as with Dr. Tacy Learn and pharmacist and answered questions. This patient is critically ill and at significant risk of neurological worsening, death and care  requires constant monitoring of vital signs, hemodynamics,respiratory and cardiac monitoring, extensive review of multiple databases, frequent neurological assessment, discussion with family, other specialists and medical decision making of high complexity.I have made any additions or clarifications directly to the above note.This critical care time does not reflect procedure time, or teaching time or supervisory time of PA/NP/Med Resident etc but could involve care discussion time.  I spent 30 minutes of neurocritical care time  in the care of  this patient.    Stroke team will sign off.  Kindly call for questions Antony Contras, MD Medical Director [place] Pager: [phone] 01/22/2022 12:43 PM   To contact Stroke Continuity provider, please refer to [email or web address] After hours, contact General Neurology

## 2022-01-22 NOTE — Progress Notes (Addendum)
   01/22/22 1143  Vitals  Temp 97.8 F (36.6 C)  Temp Source Oral  BP (!) 108/50  MAP (mmHg) 66  BP Location Right Arm  BP Method Automatic  Patient Position (if appropriate) Lying  Pulse Rate 70  Pulse Rate Source Monitor  ECG Heart Rate 70  Resp (!) 28  Oxygen Therapy  SpO2 90 %  O2 Device Nasal Cannula  O2 Flow Rate (L/min) 3 L/min  Pulse Oximetry Type Continuous   Received patient in bed to unit.  Alert and oriented.  Informed consent signed and in chart.   Treatment initiated: 0745 Treatment completed: 1120  Patient tolerated well.  Transported back to the room  Alert, without acute distress.  Hand-off given to patient's nurse.   Access used: AVF Access issues: NA  Total UF removed: 3000 ml ( 3100 ml from machine -  100 ml Albumin - 3000 ml) Medication(s) given: Albumin 25% 25G IVPB Post HD VS: see above Post HD weight: 54.7kg   [facility]

## 2022-01-22 NOTE — Progress Notes (Addendum)
Nephrology Progress Note:  Subjective:  Last HD on 10/25 with 2.6 kg UF.  On chart review midodrine stopped after yesterday AM's dose.  He was extubated yesterday.  He has been oriented to self-only per nursing.        Review of systems: Denies shortness of breath  ROS limited as he seems to say "yeah" indiscriminantly to questions  -------------------------- Notable recent events:  suffered PEA cardiac arrest on 10/21 and was intubated.  CT on 10/22 noted small CVA.  MRI with multiple infarcts c/w shower of microemboli. He had a bronchoscopy on 10/25 with thick mucoid secretions bilaterally.     Objective Vital signs in last 24 hours: Vitals:   01/22/22 0515 01/22/22 0530 01/22/22 0545 01/22/22 0600  BP:    132/65  Pulse: 69 68 69 68  Resp: (!) 26 (!) 23 (!) 25 (!) 25  Temp:      TempSrc:      SpO2: 97% 97% 99% 96%  Weight:      Height:       Weight change: 3 kg  Intake/Output Summary (Last 24 hours) at 01/22/2022 9935 Last data filed at 01/22/2022 0500 Gross per 24 hour  Intake 599.28 ml  Output 600 ml  Net -0.72 ml    Dialysis Orders:  MWF  - [facility]  4hrs, BFR 400, DFR AF1.5,  EDW 54.5kg, 2K/ 2Ca   Access: AVF  Heparin 3000 unit bolus Calcitriol 0.5 mcg PO qHD Sensipar '120mg'$  qHD     Assessment/Plan: AMS - likely 2/2 bacteremia and PNA. S/p EEG with no seizure activity. Hx dementia.   Acute respiratory failure - intubated initially. Concern for aspiration on CT. Per CCM.  Able to be extubated 10/18 however required reintubation for cardiac arrest on 10/21.  Extubated 10/26.  Optimize volume with HD as well.  S/p bronch with thick secretions.  Supportive care per primary team  Aspiration PNA - noted on CT. Abx per primary team.  Optimize volume status Bacteremia - BC +streptococcus pneumoniae. Abx per critical care. appears he completed course ESRD -  On HD per MWF schedule.  Hypotension - optimize volume with HD. He is now off of midodrine  Anemia of CKD - off of  ESA now in setting of acute stroke.  No iron in setting of acute infection. (Note he did get aranesp 10/20 prior to that) Secondary Hyperparathyroidism -  paused calcitriol with hypercalcemia per corrected calcium.  No binders now  Acute CVA - MRI with multiple infarcts c/w shower of microemboli.  Per primary team and neuro  CAD, CHB s/p PPM DMT2 - per primary team  COPD - noted decreased resp reserve  Disposition - in the ICU; per primary team   Labs: Basic Metabolic Panel: Recent Labs  Lab 01/20/22 0813 01/21/22 0524 01/22/22 0208  NA 136 137 137  K 4.2 3.9 4.5  CL 98 95* 97*  CO2 '26 29 27  '$ GLUCOSE 254* 179* 172*  BUN 63* 44* 62*  CREATININE 5.11* 3.79* 5.22*  CALCIUM 9.9 10.1 10.0  PHOS 3.4 3.1 4.5   Liver Function Tests: Recent Labs  Lab 01/20/22 0813 01/21/22 0524 01/22/22 0208  ALBUMIN 2.1* 2.6* 2.5*   No results for input(s): "LIPASE", "AMYLASE" in the last 168 hours. No results for input(s): "AMMONIA" in the last 168 hours. CBC: Recent Labs  Lab 01/18/22 0714 01/19/22 0316 01/20/22 0813 01/21/22 0524 01/22/22 0208  WBC 8.6 14.0* 12.3* 9.8 11.3*  NEUTROABS 6.7 11.0*  --   --   --  HGB 8.8* 10.0* 9.4* 8.8* 9.1*  HCT 27.1* 31.0* 27.7* 26.3* 28.3*  MCV 101.1* 100.6* 98.2 98.9 101.1*  PLT 89* 102* 128* 148* 192   Cardiac Enzymes: No results for input(s): "CKTOTAL", "CKMB", "CKMBINDEX", "TROPONINI" in the last 168 hours. CBG: Recent Labs  Lab 01/21/22 1141 01/21/22 1620 01/21/22 1940 01/21/22 2322 01/22/22 0327  GLUCAP 195* 131* 160* 178* 157*    Iron Studies: No results for input(s): "IRON", "TIBC", "TRANSFERRIN", "FERRITIN" in the last 72 hours. Studies/Results: DG CHEST PORT 1 VIEW  Result Date: 01/21/2022 CLINICAL DATA:  Aspiration pneumonia.  Intubated patient. EXAM: PORTABLE CHEST 1 VIEW COMPARISON:  Radiographs 01/20/2022, 01/19/2022 and 01/17/2022. CT 01/04/2022. FINDINGS: 0800 hours. The positions of the endotracheal tube, feeding tube  and right subclavian pacemaker leads have not significantly changed. The tip of the feeding tube is not visualized. The heart remains enlarged. There is aortic atherosclerosis. Bibasilar airspace opacities and small bilateral pleural effusions are unchanged. Possible increased density in the right perihilar region which could be due to overlying support apparatus. No evidence of pneumothorax or acute osseous abnormality. IMPRESSION: No significant change in bibasilar airspace opacities and small bilateral pleural effusions. Possible increased density in the right perihilar region which could be due to overlying support apparatus. Electronically Signed   By: Richardean Sale M.D.   On: 01/21/2022 08:41   DG Chest Port 1 View  Result Date: 01/20/2022 CLINICAL DATA:  Intubation.  Respiratory failure. EXAM: PORTABLE CHEST 1 VIEW COMPARISON:  01/19/2022 FINDINGS: Endotracheal tube tip is 3 cm above the carina. Soft feeding tube enters the abdomen. Dual lead pacemaker remains in place. Worsening of bilateral effusions and lower lung atelectasis/infiltrate. Upper lobes remain largely clear. IMPRESSION: Endotracheal tube tip 3 cm above the carina. Worsening of bilateral effusions and lower lung atelectasis/infiltrate. Electronically Signed   By: Nelson Chimes M.D.   On: 01/20/2022 08:41   Medications: Infusions:  sodium chloride 10 mL/hr at 01/22/22 0500   feeding supplement (PEPTAMEN 1.5 CAL) 1,000 mL (01/21/22 0956)   heparin 1,400 Units/hr (01/22/22 0500)    Scheduled Medications:   stroke: early stages of recovery book   Does not apply Once   acetylcysteine  3 mL Nebulization TID   aspirin  81 mg Per Tube Daily   atorvastatin  20 mg Per Tube Daily   brinzolamide  1 drop Both Eyes BID   And   brimonidine  1 drop Both Eyes BID   Chlorhexidine Gluconate Cloth  6 each Topical Q0600   Chlorhexidine Gluconate Cloth  6 each Topical Q0600   Chlorhexidine Gluconate Cloth  6 each Topical Q0600   donepezil   10 mg Per Tube QHS   feeding supplement (PROSource TF20)  60 mL Per Tube Daily   fiber supplement (BANATROL TF)  60 mL Per Tube BID   insulin aspart  0-15 Units Subcutaneous Q4H   levothyroxine  150 mcg Per Tube Q0600   memantine  10 mg Per Tube BID   multivitamin  1 tablet Per Tube QHS   mouth rinse  15 mL Mouth Rinse Q2H   pantoprazole  40 mg Per Tube Daily    have reviewed scheduled and prn medications.  Physical Exam:      General adult male in bed in no acute distress HEENT normocephalic atraumatic sclera anicteric Neck supple trachea midline Lungs reduced breath sounds; wet cough; on 3 liters oxygen  Heart S1S2 no rub Abdomen soft nontender nondistended Extremities no pitting edema  Psych no anxiety  or agitation  Neuro - oriented to person.  Says "yeah" indiscriminately  Access: LUE AVF bruit and thrill    Claudia Desanctis, MD 01/22/2022,6:25 AM     Will give midodrine once today with HD to optimize cardiopulmonary status over the weekend  Claudia Desanctis, MD 6:26 AM 01/22/2022

## 2022-01-22 NOTE — Progress Notes (Signed)
CPT held at this time due to pt receiving dialysis and per dialysis RN request.

## 2022-01-22 NOTE — Progress Notes (Signed)
Nutrition Follow-up  DOCUMENTATION CODES:  Severe malnutrition in context of chronic illness  INTERVENTION:  Continue continuous TF via cortrak tube: Peptamen 1.5 @ 50 ml/hr (1200 ml/day) PROSource TF20 60 ml daily Tube feeding regimen at goal rate provides 1880 kcal, 102 grams of protein, and 923 ml of H2O.  Banatrol TF 60 ml BID per tube Monitor for progress with SLP and diet advancement   NUTRITION DIAGNOSIS:  Severe Malnutrition related to chronic illness (ESRD on HD, COPD) as evidenced by severe fat depletion, moderate muscle depletion, severe muscle depletion. - remains applicable  GOAL:  Patient will meet greater than or equal to 90% of their needs - progressing, being met with TF  MONITOR:  Vent status, Labs, Weight trends, TF tolerance, Skin, I & O's  REASON FOR ASSESSMENT:  Ventilator, Consult Enteral/tube feeding initiation and management  ASSESSMENT:  88 year old male with PMHx of peptic ulcer disease, HTN, HLD, CAD, ESRD on HD MWF, COPD, dementia, hypothyroidism, DM who presented after being found unresponsive at home with septic shock and acute hypoxic respiratory failure secondary to aspiration PNA, acute metabolic encephalopathy. Intubated 01/18/2022.  10/17 - intubated in ED 10/18 - extubated 10/20 - Cortrak placed (tip gastric) 10/21 - PEA arrest, intubated 10/25 - bronchoscopy (thick secretions bilaterally) 10/26 - extubated  Pt resting in bed at the time of assessment. Not alert or able to participate in an nutrition hx. Extubated yesterday, cortrak remains in place with TF infusing at goal. Currently undergoing HD today.  Discussed in rounds, pt will likely be able to be transferred out of ICU in the coming days.   Intake/Output Summary (Last 24 hours) at 01/22/2022 0731 Last data filed at 01/22/2022 0600 Gross per 24 hour  Intake 604.26 ml  Output 900 ml  Net -295.74 ml  Net IO Since Admission: 1,873.61 mL [01/22/22 0731]  Last HD 10/25 Net UF  2.6L EDW: 54.5 kg Admit weight: 55.9 kg Current weight: 57.8 kg  Nutritionally Relevant Medications: Scheduled Meds:  atorvastatin  20 mg Per Tube Daily   feeding supplement (PROSource TF20)  60 mL Per Tube Daily   fiber supplement (BANATROL TF)  60 mL Per Tube BID   insulin aspart  0-15 Units Subcutaneous Q4H   levothyroxine  150 mcg Per Tube Q0600   memantine  10 mg Per Tube BID   multivitamin  1 tablet Per Tube QHS   pantoprazole  40 mg Per Tube Daily   Continuous Infusions:  PEPTAMEN 1.5 CAL 1,000 mL (01/21/22 0956)   PRN Meds: docusate, polyethylene glycol  Labs Reviewed: Chloride 97 BUN 62, creatinine 5.22 CBG ranges from 131-200 mg/dL over the last 24 hours  NUTRITION - FOCUSED PHYSICAL EXAM: Flowsheet Row Most Recent Value  Orbital Region Moderate depletion  Upper Arm Region Severe depletion  Thoracic and Lumbar Region Severe depletion  Buccal Region Unable to assess  Temple Region Severe depletion  Clavicle Bone Region Moderate depletion  Clavicle and Acromion Bone Region Severe depletion  Scapular Bone Region Unable to assess  Dorsal Hand Moderate depletion  Patellar Region Severe depletion  Anterior Thigh Region Severe depletion  Posterior Calf Region Severe depletion  Edema (RD Assessment) None  Hair Reviewed  Eyes Unable to assess  Mouth Reviewed  Skin Reviewed  Nails Reviewed    Diet Order:   Diet Order             Diet NPO time specified  Diet effective now  EDUCATION NEEDS:  Not appropriate for education at this time  Skin:  Skin Assessment: Skin Integrity Issues: Skin Integrity Issues:: Stage I Stage I: sacrum  Last BM:  01/18/22 rectal tube 965m output x 24 hours  Height:  Ht Readings from Last 1 Encounters:  01/19/22 _0  (1.753 m)   Weight:  Wt Readings from Last 1 Encounters:  01/22/22 57.8 kg    Ideal Body Weight:  72.7 kg  BMI:  Body mass index is 18.82 kg/m.  Estimated Nutritional Needs:   Kcal:  11950-[address]326Protein:  90-100 grams Fluid:  UOP + 1 L    RRanell Patrick RD, LDN Clinical Dietitian RD pager # available in [facility]  After hours/weekend pager # available in [facility]

## 2022-01-22 NOTE — Progress Notes (Signed)
Physical Therapy Treatment Patient Details Name: Jeffrey Campbell MRN: [id number] DOB: 1933/03/24 Today's Date: 01/22/2022   History of Present Illness 88 y.o. male admitted 10/17 with AMS, hypotensive.  VDRF 10/17-10/18.  10/21 PCCM reconsulted for complete whiteout of left lung, transferred to ICU and intubated, brief PEA arrest with ROSC in 4 minutes. 10/22 CT with acute Lt cerebellar infarct.  PMHx: HTN, HLD, T2DM, hypothyroidism, COPD, OSA, CHF, PAD, CAD with complete heart block s/p PPM, mitral prolapse with mitral regurgitation, ESRD on HD MWF    PT Comments    Pt admitted with above diagnosis. Pt was able to sit EOB with max to total assist of 2 intiially and progresing to min assist of 2 for brief moment.  Sat a total of 10 min and needing max to total assist of 2 most of time. Pt not following commands and was lethargic as well.  Drooling and used suction for pt a few times. Pt desaturating to mid 80's on 5LO2 ultimately with activity (to incr sats >90%) and diaphoretic and called nursing to check pt however BP WNL and blood sugar high.  MD also came in and assessed pt as well.  Pt did have dialysis this am.  MD and nurse to continue to monitor pt. Pt currently with functional limitations due to balance and endurance deficits. Pt will benefit from skilled PT to increase their independence and safety with mobility to allow discharge to the venue listed below.      Recommendations for follow up therapy are one component of a multi-disciplinary discharge planning process, led by the attending physician.  Recommendations may be updated based on patient status, additional functional criteria and insurance authorization.  Follow Up Recommendations  Skilled nursing-short term rehab (<3 hours/day) Can patient physically be transported by private vehicle: No   Assistance Recommended at Discharge Frequent or constant Supervision/Assistance  Patient can return home with the following A lot of help  with bathing/dressing/bathroom;Assistance with cooking/housework;Assist for transportation;Help with stairs or ramp for entrance;Two people to help with walking and/or transfers;Direct supervision/assist for financial management;Direct supervision/assist for medications management   Equipment Recommendations  Wheelchair (measurements PT);Wheelchair cushion (measurements PT);Hospital bed    Recommendations for Other Services       Precautions / Restrictions Precautions Precautions: Fall Precaution Comments: flexi seal, cortrak Restrictions Weight Bearing Restrictions: No     Mobility  Bed Mobility Overal bed mobility: Needs Assistance Bed Mobility: Supine to Sit, Sit to Supine Rolling: Max assist Sidelying to sit: Total assist, +2 for physical assistance Supine to sit: Total assist, +2 for physical assistance Sit to supine: Max assist, +2 for physical assistance   General bed mobility comments: max assist to rotate pelvis and elevate trunk from surface. Max to clear legs and trunk into sitting with +2 for safety. Return to bed with max +2 for physical assist, lines and safety    Transfers Overall transfer level: Needs assistance Equipment used: Rolling walker (2 wheels) Transfers: Sit to/from Stand Sit to Stand: Max assist, +2 physical assistance           General transfer comment: max +2 with bil knees blocked, use of pad and physical assist to weight shift and advance feet to side step toward HOB. EOb 9 min with min assist, 1 min with minguard and cues for posture    Ambulation/Gait               General Gait Details: unable   Stairs  Wheelchair Mobility    Modified Rankin (Stroke Patients Only) Modified Rankin (Stroke Patients Only) Pre-Morbid Rankin Score: Moderate disability Modified Rankin: Severe disability     Balance Overall balance assessment: Needs assistance Sitting-balance support: No upper extremity supported, Feet  supported Sitting balance-Leahy Scale: Poor Sitting balance - Comments: poor to zero with with right and left lateral lean when he would lose balance without attempt to correct   Standing balance support: Bilateral upper extremity supported, During functional activity Standing balance-Leahy Scale: Zero Standing balance comment: bil UE support with knees blocked and physical assist                            Cognition Arousal/Alertness: Lethargic Behavior During Therapy: Flat affect Overall Cognitive Status: Impaired/Different from baseline Area of Impairment: Following commands, Attention, Safety/judgement, Awareness, Problem solving                   Current Attention Level: Focused   Following Commands:  (not following any one step commands) Safety/Judgement: Decreased awareness of safety, Decreased awareness of deficits Awareness: Intellectual Problem Solving: Slow processing, Decreased initiation, Requires verbal cues, Requires tactile cues, Difficulty sequencing General Comments: head turned and downward tilted to left, eyes closed 90% of sessoin        Exercises General Exercises - Upper Extremity Shoulder Flexion: PROM, Both, Seated Shoulder Extension: AAROM, Both, 5 reps, Supine Shoulder ABduction: AAROM, Both, 5 reps, Supine Shoulder ADduction: AAROM, Both, 5 reps, Supine Elbow Flexion: Both, Supine, PROM Elbow Extension: Both, Supine, PROM General Exercises - Lower Extremity Ankle Circles/Pumps: AAROM, Both, 5 reps, Supine Heel Slides: AAROM, Both, 5 reps, Supine Hip ABduction/ADduction: AAROM, Both, 5 reps, Supine    General Comments General comments (skin integrity, edema, etc.): O2 sats on 2L on arrival 90%.  Desaturation to mid 80's with movement and needed 5L ultimately by end of treatment to keep sats >90%.  Nurse and MD aware.  BP stable 127/64.      Pertinent Vitals/Pain Pain Assessment Pain Assessment: Faces Faces Pain Scale: Hurts a  little bit Breathing: normal Negative Vocalization: occasional moan/groan, low speech, negative/disapproving quality Facial Expression: smiling or inexpressive Body Language: tense, distressed pacing, fidgeting Consolability: no need to console PAINAD Score: 2 Pain Location: when trying to do PROM at his neck Pain Descriptors / Indicators: Grimacing, Guarding, Moaning Pain Intervention(s): Limited activity within patient's tolerance, Monitored during session, Repositioned    Home Living                          Prior Function            PT Goals (current goals can now be found in the care plan section) Acute Rehab PT Goals Patient Stated Goal: unable to state due to intubated PT Goal Formulation: With patient Time For Goal Achievement: 02/02/22 Potential to Achieve Goals: Fair Progress towards PT goals: Progressing toward goals    Frequency    Min 3X/week      PT Plan Current plan remains appropriate    Co-evaluation PT/OT/SLP Co-Evaluation/Treatment: Yes Reason for Co-Treatment: Complexity of the patient's impairments (multi-system involvement);Necessary to address cognition/behavior during functional activity;For patient/therapist safety PT goals addressed during session: Mobility/safety with mobility OT goals addressed during session: Strengthening/ROM      AM-PAC PT "6 Clicks" Mobility   Outcome Measure  Help needed turning from your back to your side while in a flat bed without using  bedrails?: Total Help needed moving from lying on your back to sitting on the side of a flat bed without using bedrails?: Total Help needed moving to and from a bed to a chair (including a wheelchair)?: Total Help needed standing up from a chair using your arms (e.g., wheelchair or bedside chair)?: Total Help needed to walk in hospital room?: Total Help needed climbing 3-5 steps with a railing? : Total 6 Click Score: 6    End of Session Equipment Utilized During  Treatment: Oxygen Activity Tolerance: Patient limited by fatigue Patient left: in bed;with call bell/phone within reach;with bed alarm set Nurse Communication: Mobility status;Need for lift equipment PT Visit Diagnosis: Muscle weakness (generalized) (M62.81);Other abnormalities of gait and mobility (R26.89);Difficulty in walking, not elsewhere classified (R26.2)     Time: 1330-1405 PT Time Calculation (min) (ACUTE ONLY): 35 min  Charges:  $Therapeutic Activity: 8-22 mins                     [facility] M,PT Acute Rehab Services [phone]    Alvira Philips 01/22/2022, 4:11 PM

## 2022-01-23 DIAGNOSIS — I5021 Acute systolic (congestive) heart failure: Secondary | ICD-10-CM | POA: Diagnosis not present

## 2022-01-23 DIAGNOSIS — J9601 Acute respiratory failure with hypoxia: Secondary | ICD-10-CM | POA: Diagnosis not present

## 2022-01-23 DIAGNOSIS — A419 Sepsis, unspecified organism: Secondary | ICD-10-CM | POA: Diagnosis not present

## 2022-01-23 DIAGNOSIS — I469 Cardiac arrest, cause unspecified: Secondary | ICD-10-CM | POA: Diagnosis not present

## 2022-01-23 LAB — CBC
HCT: 29.2 % — ABNORMAL LOW (ref 39.0–52.0)
Hemoglobin: 9.4 g/dL — ABNORMAL LOW (ref 13.0–17.0)
MCH: 32.9 pg (ref 26.0–34.0)
MCHC: 32.2 g/dL (ref 30.0–36.0)
MCV: 102.1 fL — ABNORMAL HIGH (ref 80.0–100.0)
Platelets: 223 10*3/uL (ref 150–400)
RBC: 2.86 MIL/uL — ABNORMAL LOW (ref 4.22–5.81)
RDW: 15.5 % (ref 11.5–15.5)
WBC: 13 10*3/uL — ABNORMAL HIGH (ref 4.0–10.5)
nRBC: 0 % (ref 0.0–0.2)

## 2022-01-23 LAB — GLUCOSE, CAPILLARY
Glucose-Capillary: 159 mg/dL — ABNORMAL HIGH (ref 70–99)
Glucose-Capillary: 170 mg/dL — ABNORMAL HIGH (ref 70–99)
Glucose-Capillary: 204 mg/dL — ABNORMAL HIGH (ref 70–99)
Glucose-Capillary: 214 mg/dL — ABNORMAL HIGH (ref 70–99)
Glucose-Capillary: 174 mg/dL — ABNORMAL HIGH (ref 70–99)

## 2022-01-23 LAB — RENAL FUNCTION PANEL
Albumin: 3 g/dL — ABNORMAL LOW (ref 3.5–5.0)
Anion gap: 15 (ref 5–15)
BUN: 45 mg/dL — ABNORMAL HIGH (ref 8–23)
CO2: 26 mmol/L (ref 22–32)
Calcium: 10.3 mg/dL (ref 8.9–10.3)
Chloride: 96 mmol/L — ABNORMAL LOW (ref 98–111)
Creatinine, Ser: 3.69 mg/dL — ABNORMAL HIGH (ref 0.61–1.24)
GFR, Estimated: 15 mL/min — ABNORMAL LOW (ref >60)
Glucose, Bld: 143 mg/dL — ABNORMAL HIGH (ref 70–99)
Phosphorus: 3.8 mg/dL (ref 2.5–4.6)
Potassium: 4.5 mmol/L (ref 3.5–5.1)
Sodium: 137 mmol/L (ref 135–145)

## 2022-01-23 MED ORDER — FLUDROCORTISONE ACETATE 0.1 MG PO TABS: 0.1000 mg | ORAL_TABLET | Freq: Every day | ORAL | Status: DC

## 2022-01-23 NOTE — Progress Notes (Signed)
NAME:  Jeffrey Campbell, MRN:  [id number], DOB:  Nov 24, 1932, LOS: 60 ADMISSION DATE:  12/29/2021, CONSULTATION DATE:  01/17/2022 REFERRING MD:  EDP, CHIEF COMPLAINT:  AMS. ARF   History of Present Illness:  87 year old male with extensive PMH including ESRD on HD MWF. Presents to ED on 10/17 after being found unresponsive at home. Per family patient recent admission 8/23 for altered mental status, UTI. Now with reported tiredness, weakness over last 24 hours. This morning when daughter arrived at patient home, she reports patient was unresponsive making gurgling sounds, on arrival to ED patient remains minimally responsive with tachypnea and hypotension requiring intubation. CXR with left lower lobe infiltrate. CT head negative. CT C/A/P with extensive bilateral infiltirates and noted left lower lobe infiltrate concerning for aspiration. Given Cefepime/Vancomycin. Critical Care Consulted for admission.   Pertinent  Medical History  HTN, DMII, CAD, CHB h/o PPM, ESRD on HD MWF, COPD,   Significant Hospital Events: Including procedures, antibiotic start and stop dates in addition to other pertinent events   ETT 10/17-10/18 10/20 transferred out of ICU 10/21 PCCM reconsulted for complete whiteout of left lung, transferred to ICU and intubated, brief PEA arrest with ROSC in  4 minutes I/O placed 10/22 CT Head: Small acute infarct in the inferior left cerebellum 10/24 MRI brain: Acute infarct of the left and right cerebellum, right pons as well as multiple scattered infarcts in both cerebral hemisphere 10/24 MRA head: Severe stenosis of L ICA, moderate stenosis of R ICA and moderate stenosis of distal right vertebral artery 10/25 CXR worsening lower lung atelectasis/infiltrate 10/25 bronchoscopy w/ BAL 10/26 extubated to nasal cannula  Interim History / Subjective:  No acute events overnight Down to 2 L [place] Awake but drowsy, following commands  Objective   Blood pressure 127/60, pulse 70,  temperature 97.9 F (36.6 C), temperature source Axillary, resp. rate (!) 25, height _0  (1.753 m), weight 54.8 kg, SpO2 97 %.        Intake/Output Summary (Last 24 hours) at 01/23/2022 0707 Last data filed at 01/23/2022 0600 Gross per 24 hour  Intake 270.26 ml  Output 3375 ml  Net -3104.74 ml   Filed Weights   01/22/22 0715 01/22/22 1143 01/23/22 0500  Weight: 57.7 kg 54.7 kg 54.8 kg   Examination: General: Chronically ill elderly man laying in bed.  NAD. HENT: [place]/AT. MMM.  Lungs: On 2 L [place].  Congested anterior lung sounds. No increased WOB.  Cardiovascular: V-Paced-rhythm. No m/r/g.  Abdomen: Soft.  NT/ND.  Normal bowel sounds. Extremities: Cool extremities.  Edematous BUEs.  Faint DP pulses. Neuro: Alert and awake.  Moving all extremities.  Able to follow commands. Mumbling.   WBC 13.0, Hgb 9.4, platelet 223 K+ 4.5, creatinine 3.69 CBGs 170s to 180s  Resolved Hospital Problem list   Septic shock Respiratory failure  Assessment & Plan:  Septic Shock 2/2 Aspiration PNA S/p PEA cardiac arrest postintubation due to hypoxia, ROSC in 4 minutes He remains afebrile. Mild leukocytosis but clinically improving. Completed 10-day course of antibiotics. -Trend WBC/fever curve -Transfer to progressive bed  Acute hypoxic Respiratory Failure 2/2 to Aspiration Pneumonia and left-sided mucous plugging  S/p intubation and bronchoscopy on 10/21 and repeat bronchoscopy 10/25.  Extubated on 10/26 to [place]. Stable on 2 LNC. Remains at risk of aspiration but respiratory remains stable.  -Continue O2 supplementation -Continue nebs  Acute cerebellar, pons and cerebral infarct Acute septic encephalopathy  Postanoxic injury Advanced dementia CT Head on 10/22 showed small acute  infarct in the inferior left cerebellum. Following commands today.  MRI brain shows multiple infarcts in the cerebellum, pons and cerebral hemisphere. MRA head showing severe stenosis of left ICA, moderate right ICA. MRI  findings concerning for micro embolism from likely cardiac source in the setting of A-fib and severely dilated left and right atrium. Discussed with family concerning risk and benefits of long-term anticoagulation and decision was made against due to increased risk of falls.  -Neurology following signed off -Continue IV heparin while inpatient -PT recommending SNF -ASA, statin  ESRD on HD MWF Anemia of Chronic Disease Kidney function remained stable without electrolyte abnormalities.  Tolerated HD well yesterday. Kidney function remains stable. -Nephro following, appreciate recs -Continue MWF HD sessions -Avoid nephrotoxic agents   Troponemia Hx of CHB s/p pacemaker Acute systolic CHF Atrial fibrillation TTE showed global hypokinesis with elevated Tropon 4,963. Demand ischemia in the setting of septic shock.  -Cardiology following at a distance -Holding GDMT -Continue medical mgmt w/ ASA, statin -Tele  Hypothyroidism -Continue levothyroxine   DMII CBGs 150s to 180s. -Continue SSI. Goal 140-180   Severe protein calorie malnutrition Continue Tube feeds  Best Practice (right click and "Reselect all SmartList Selections" daily)   Diet/type: tubefeeds and NPO DVT prophylaxis: prophylactic heparin  GI prophylaxis: PPI Lines: N/A Foley:  N/A Code Status:  full code Last date of multidisciplinary goals of care discussion [Pending daughter update]  Labs   CBC: Recent Labs  Lab 01/18/22 0714 01/19/22 0316 01/20/22 0813 01/21/22 0524 01/22/22 0208 01/23/22 0100  WBC 8.6 14.0* 12.3* 9.8 11.3* 13.0*  NEUTROABS 6.7 11.0*  --   --   --   --   HGB 8.8* 10.0* 9.4* 8.8* 9.1* 9.4*  HCT 27.1* 31.0* 27.7* 26.3* 28.3* 29.2*  MCV 101.1* 100.6* 98.2 98.9 101.1* 102.1*  PLT 89* 102* 128* 148* 192 122    Basic Metabolic Panel: Recent Labs  Lab 01/19/22 0316 01/20/22 0813 01/21/22 0524 01/22/22 0208 01/23/22 0100  NA 141 136 137 137 137  K 3.8 4.2 3.9 4.5 4.5  CL 96* 98  95* 97* 96*  CO2 _0 GLUCOSE 158* 254* 179* 172* 143*  BUN 38* 63* 44* 62* 45*  CREATININE 3.77* 5.11* 3.79* 5.22* 3.69*  CALCIUM 9.7 9.9 10.1 10.0 10.3  PHOS 2.6 3.4 3.1 4.5 3.8   GFR: Estimated Creatinine Clearance: 11.1 mL/min (A) (by C-G formula based on SCr of 3.69 mg/dL (H)). Recent Labs  Lab 01/20/22 0813 01/21/22 0524 01/22/22 0208 01/23/22 0100  WBC 12.3* 9.8 11.3* 13.0*    Liver Function Tests: Recent Labs  Lab 01/19/22 0316 01/20/22 0813 01/21/22 0524 01/22/22 0208 01/23/22 0100  ALBUMIN 2.2* 2.1* 2.6* 2.5* 3.0*   No results for input(s): "LIPASE", "AMYLASE" in the last 168 hours. No results for input(s): "AMMONIA" in the last 168 hours.  ABG    Component Value Date/Time   PHART 7.497 (H) 01/16/2022 1309   PCO2ART 35.4 01/16/2022 1309   PO2ART 73 (L) 01/16/2022 1309   HCO3 27.5 01/16/2022 1309   TCO2 29 01/16/2022 1309   ACIDBASEDEF 5.0 (H) 07/28/2016 0850   ACIDBASEDEF 5.0 (H) 07/28/2016 0850   O2SAT 96 01/16/2022 1309     Coagulation Profile: No results for input(s): "INR", "PROTIME" in the last 168 hours.   Cardiac Enzymes: No results for input(s): "CKTOTAL", "CKMB", "CKMBINDEX", "TROPONINI" in the last 168 hours.  HbA1C: Hgb A1c MFr Bld  Date/Time Value Ref Range Status  12/30/2021 01:17 PM  5.2 4.8 - 5.6 % Final    Comment:    (NOTE) Pre diabetes:          5.7%-6.4%  Diabetes:              >6.4%  Glycemic control for   <7.0% adults with diabetes   09/19/2016 04:07 AM 5.5 4.8 - 5.6 % Final    Comment:    (NOTE)         Pre-diabetes: 5.7 - 6.4         Diabetes: >6.4         Glycemic control for adults with diabetes: <7.0     CBG: Recent Labs  Lab 01/22/22 1403 01/22/22 1517 01/22/22 1918 01/22/22 2309 01/23/22 0310  GLUCAP 195* 157* 180* 163* 159*    Review of Systems:   As in HPI  Past Medical History:  He,  has a past medical history of Anemia, BPH (benign prostatic hyperplasia), CAD (coronary artery  disease), Carotid artery stenosis, CKD (chronic kidney disease), stage III ([place]), Diastolic dysfunction, Glaucoma, Graves disease, History of ETOH abuse, Hyperlipemia, Hypertension, Hyperthyroidism (08/26/10), Memory loss, Mitral regurgitation (echo 2015), Multiple thyroid nodules, MVP (mitral valve prolapse) (11/2012), OSA (obstructive sleep apnea), Pre-diabetes, PUD (peptic ulcer disease), Pulmonary hypertension ([place]) (echo 2015), and Upper airway resistance syndrome.   Surgical History:   Past Surgical History:  Procedure Laterality Date   APPENDECTOMY     AV FISTULA PLACEMENT Left 10/18/2017   Procedure: ARTERIOVENOUS (AV) FISTULA CREATION ARM;  Surgeon: Waynetta Sandy, MD;  Location: [place];  Service: Vascular;  Laterality: Left;   CARDIAC CATHETERIZATION     CARDIAC CATHETERIZATION N/A 02/17/2015   Procedure: Right Heart Cath;  Surgeon: Larey Dresser, MD;  Location: [place] [facility];  Service: Cardiovascular;  Laterality: N/A;   CARDIOVERSION N/A 02/08/2019   Procedure: CARDIOVERSION;  Surgeon: Donato Heinz, MD;  Location: [place];  Service: Endoscopy;  Laterality: N/A;   ESOPHAGOGASTRODUODENOSCOPY (EGD) WITH PROPOFOL Left 10/16/2016   Procedure: ESOPHAGOGASTRODUODENOSCOPY (EGD) WITH PROPOFOL;  Surgeon: Ronnette Juniper, MD;  Location: [place];  Service: Gastroenterology;  Laterality: Left;   heart catherization     HERNIA REPAIR  10/2009   IR RADIOLOGIST EVAL & MGMT  09/28/2016   IR RADIOLOGIST EVAL & MGMT  10/19/2016   IR RADIOLOGIST EVAL & MGMT  01/18/2017   PACEMAKER IMPLANT N/A 02/09/2019   Procedure: PACEMAKER IMPLANT;  Surgeon: Constance Haw, MD;  Location: [place] [facility];  Service: Cardiovascular;  Laterality: N/A;   RIGHT HEART CATH N/A 07/28/2016   Procedure: Right Heart Cath;  Surgeon: Larey Dresser, MD;  Location: [place] [facility];  Service: Cardiovascular;  Laterality: N/A;     Social History:   reports that he quit smoking about 37  years ago. His smoking use included cigarettes. He has a 80.00 pack-year smoking history. He has never used smokeless tobacco. He reports that he does not drink alcohol and does not use drugs.   Family History:  His family history includes Colon cancer in his brother, maternal uncle, and mother; Hypertension in his father; Kidney failure in his father; Lung disease in his brother.   Allergies Allergies  Allergen Reactions   Cardura [Doxazosin] Shortness Of Breath and Other (See Comments)    Dizziness    Hydrochlorothiazide Shortness Of Breath and Other (See Comments)    Dizziness    Iodinated Contrast Media Other (See Comments)    "shut down his kidneys" Patient has stage  III chronic kidney disease   Zocor [Simvastatin] Other (See Comments)    Kidney problems    Protonix [Pantoprazole] Other (See Comments)    Worsening kidney problems   Rocaltrol [Calcitriol] Other (See Comments)    Unknown reaction   Budesonide-Formoterol Fumarate Other (See Comments)    Dry mouth   Minodyl [Minoxidil] Other (See Comments)    Unknown raction   Tiotropium Bromide Monohydrate Other (See Comments)    Dry mouth   Tricor [Fenofibrate]     Unknown reaction     Home Medications  Prior to Admission medications   Medication Sig Start Date End Date Taking? Authorizing Provider  atorvastatin (LIPITOR) 20 MG tablet TAKE 1 BY MOUTH DAILY ABSOLUTE LAST REFILL WITHOUT OFFICE VISIT PLEASE CALL [phone] TO SCHEDULE Patient taking differently: Take 20 mg by mouth daily. 11/09/21  Yes Larey Dresser, MD  Brinzolamide-Brimonidine [facility]) 1-0.2 % SUSP Place 1 drop into both eyes 2 (two) times daily.    Yes [provider]  calcium acetate (PHOSLO) 667 MG capsule Take 667 mg by mouth See admin instructions. 667 mg 3 times daily with meals, 667 mg 2 times daily with snacks. 11/30/19  Yes [provider]  donepezil (ARICEPT) 10 MG tablet Take 1 tablet (10 mg total) by mouth at bedtime. 08/17/21   Yes Suzzanne Cloud, NP  doxazosin (CARDURA) 4 MG tablet Take 1 tablet (4 mg total) by mouth daily. Patient taking differently: Take 4 mg by mouth 2 (two) times daily. 02/12/19  Yes Simmons, Brittainy M, PA-C  levothyroxine (SYNTHROID, LEVOTHROID) 150 MCG tablet Take 150 mcg by mouth daily before breakfast.   Yes [provider]  memantine (NAMENDA) 10 MG tablet TAKE 1 TABLET BY MOUTH TWICE A DAY Patient taking differently: Take 10 mg by mouth 2 (two) times daily. 01/19/21  Yes Suzzanne Cloud, NP  multivitamin (RENA-VIT) TABS tablet Take 1 tablet by mouth daily. 11/01/19  Yes [provider]     Critical care time: 80

## 2022-01-23 NOTE — Progress Notes (Signed)
Nephrology Progress Note:  Subjective:  Last HD on 10/27 with 3.0 kg UF.   He does not answer my questions.  He provided his first name to nurse about 10 minutes ago but doesn't repeat this on my exam or when he joins me.   Review of systems:   Unable to obtain - He does not answer questions this am  -------------------------- Notable recent events:  suffered PEA cardiac arrest on 10/21 and was intubated.  CT on 10/22 noted small CVA.  MRI with multiple infarcts c/w shower of microemboli. He had a bronchoscopy on 10/25 with thick mucoid secretions bilaterally.     Objective Vital signs in last 24 hours: Vitals:   01/23/22 0615 01/23/22 0630 01/23/22 0645 01/23/22 0700  BP: 128/62 127/60 135/61 (!) 139/55  Pulse: 70 70 70 69  Resp: (!) 28 (!) 25 (!) 24 (!) 24  Temp:      TempSrc:      SpO2: 98% 97% 96% 97%  Weight:      Height:       Weight change: -0.1 kg  Intake/Output Summary (Last 24 hours) at 01/23/2022 0714 Last data filed at 01/23/2022 0700 Gross per 24 hour  Intake 280.26 ml  Output 3375 ml  Net -3094.74 ml    Dialysis Orders:  MWF  - [facility]  4hrs, BFR 400, DFR AF1.5,  EDW 54.5kg, 2K/ 2Ca   Access: AVF  Heparin 3000 unit bolus Calcitriol 0.5 mcg PO qHD Sensipar '120mg'$  qHD     Assessment/Plan: AMS - likely 2/2 bacteremia and PNA. S/p EEG with no seizure activity. Hx dementia.   Acute respiratory failure - intubated initially. Concern for aspiration on CT. Per CCM.  Able to be extubated 10/18 however required reintubation for cardiac arrest on 10/21.  Extubated 10/26.  Optimize volume with HD as well.  S/p bronch with thick secretions.  Supportive care per primary team  Aspiration PNA - noted on CT. Abx per primary team.  Optimize volume status Bacteremia - BC +streptococcus pneumoniae. Abx per critical care. appears he completed course ESRD -  On HD per MWF schedule.  Had midodrine with last HD and may be able to stop same  Hypotension - optimize volume with HD. He  is now off of scheduled midodrine  Anemia of CKD - off of ESA now in setting of acute stroke.  No iron in setting of acute infection. (Note he did get aranesp 10/20 prior to that) Secondary Hyperparathyroidism - paused calcitriol with hypercalcemia per corrected calcium.  No binders now  Acute CVA - MRI with multiple infarcts c/w shower of microemboli.  Per primary team and neuro  CAD, CHB s/p PPM DMT2 - per primary team  COPD - noted decreased resp reserve  Disposition - per primary team   Labs: Basic Metabolic Panel: Recent Labs  Lab 01/21/22 0524 01/22/22 0208 01/23/22 0100  NA 137 137 137  K 3.9 4.5 4.5  CL 95* 97* 96*  CO2 '29 27 26  '$ GLUCOSE 179* 172* 143*  BUN 44* 62* 45*  CREATININE 3.79* 5.22* 3.69*  CALCIUM 10.1 10.0 10.3  PHOS 3.1 4.5 3.8   Liver Function Tests: Recent Labs  Lab 01/21/22 0524 01/22/22 0208 01/23/22 0100  ALBUMIN 2.6* 2.5* 3.0*   No results for input(s): "LIPASE", "AMYLASE" in the last 168 hours. No results for input(s): "AMMONIA" in the last 168 hours. CBC: Recent Labs  Lab 01/18/22 0714 01/19/22 0316 01/20/22 0813 01/21/22 0524 01/22/22 0208 01/23/22 0100  WBC  8.6 14.0* 12.3* 9.8 11.3* 13.0*  NEUTROABS 6.7 11.0*  --   --   --   --   HGB 8.8* 10.0* 9.4* 8.8* 9.1* 9.4*  HCT 27.1* 31.0* 27.7* 26.3* 28.3* 29.2*  MCV 101.1* 100.6* 98.2 98.9 101.1* 102.1*  PLT 89* 102* 128* 148* 192 223   Cardiac Enzymes: No results for input(s): "CKTOTAL", "CKMB", "CKMBINDEX", "TROPONINI" in the last 168 hours. CBG: Recent Labs  Lab 01/22/22 1403 01/22/22 1517 01/22/22 1918 01/22/22 2309 01/23/22 0310  GLUCAP 195* 157* 180* 163* 159*    Iron Studies: No results for input(s): "IRON", "TIBC", "TRANSFERRIN", "FERRITIN" in the last 72 hours. Studies/Results: DG CHEST PORT 1 VIEW  Result Date: 01/21/2022 CLINICAL DATA:  Aspiration pneumonia.  Intubated patient. EXAM: PORTABLE CHEST 1 VIEW COMPARISON:  Radiographs 01/20/2022, 01/19/2022 and  01/17/2022. CT 01/20/2022. FINDINGS: 0800 hours. The positions of the endotracheal tube, feeding tube and right subclavian pacemaker leads have not significantly changed. The tip of the feeding tube is not visualized. The heart remains enlarged. There is aortic atherosclerosis. Bibasilar airspace opacities and small bilateral pleural effusions are unchanged. Possible increased density in the right perihilar region which could be due to overlying support apparatus. No evidence of pneumothorax or acute osseous abnormality. IMPRESSION: No significant change in bibasilar airspace opacities and small bilateral pleural effusions. Possible increased density in the right perihilar region which could be due to overlying support apparatus. Electronically Signed   By: Richardean Sale M.D.   On: 01/21/2022 08:41   Medications: Infusions:  sodium chloride 10 mL/hr at 01/23/22 0700   feeding supplement (PEPTAMEN 1.5 CAL) 1,000 mL (01/22/22 1247)    Scheduled Medications:   stroke: early stages of recovery book   Does not apply Once   acetylcysteine  3 mL Nebulization TID   aspirin  81 mg Per Tube Daily   atorvastatin  20 mg Per Tube Daily   brinzolamide  1 drop Both Eyes BID   And   brimonidine  1 drop Both Eyes BID   Chlorhexidine Gluconate Cloth  6 each Topical Q0600   Chlorhexidine Gluconate Cloth  6 each Topical Q0600   Chlorhexidine Gluconate Cloth  6 each Topical Q0600   donepezil  10 mg Per Tube QHS   feeding supplement (PROSource TF20)  60 mL Per Tube Daily   fiber supplement (BANATROL TF)  60 mL Per Tube BID   insulin aspart  0-15 Units Subcutaneous Q4H   insulin aspart  2 Units Subcutaneous Q4H   levothyroxine  150 mcg Per Tube Q0600   memantine  10 mg Per Tube BID   multivitamin  1 tablet Per Tube QHS   mouth rinse  15 mL Mouth Rinse 4 times per day   pantoprazole  40 mg Per Tube Daily    have reviewed scheduled and prn medications.  Physical Exam:       General adult male in bed in no  acute distress HEENT normocephalic atraumatic sclera anicteric Neck supple trachea midline Lungs reduced breath sounds; on 3 liters oxygen  Heart S1S2 no rub Abdomen soft nontender nondistended Extremities no pitting edema  Psych no anxiety or agitation  Neuro - does not answer orienting questions; tracks visually  Access: LUE AVF bruit and thrill    Claudia Desanctis, MD 01/23/2022, 7:27 AM

## 2022-01-24 ENCOUNTER — Inpatient Hospital Stay (HOSPITAL_COMMUNITY): Payer: Medicare Other

## 2022-01-24 DIAGNOSIS — G9340 Encephalopathy, unspecified: Secondary | ICD-10-CM | POA: Diagnosis not present

## 2022-01-24 DIAGNOSIS — I469 Cardiac arrest, cause unspecified: Secondary | ICD-10-CM | POA: Diagnosis not present

## 2022-01-24 DIAGNOSIS — I5021 Acute systolic (congestive) heart failure: Secondary | ICD-10-CM | POA: Diagnosis not present

## 2022-01-24 DIAGNOSIS — A419 Sepsis, unspecified organism: Secondary | ICD-10-CM | POA: Diagnosis not present

## 2022-01-24 DIAGNOSIS — J9601 Acute respiratory failure with hypoxia: Secondary | ICD-10-CM | POA: Diagnosis not present

## 2022-01-24 LAB — CBC
HCT: 33.4 % — ABNORMAL LOW (ref 39.0–52.0)
Hemoglobin: 10.4 g/dL — ABNORMAL LOW (ref 13.0–17.0)
MCH: 31.4 pg (ref 26.0–34.0)
MCHC: 31.1 g/dL (ref 30.0–36.0)
MCV: 100.9 fL — ABNORMAL HIGH (ref 80.0–100.0)
Platelets: 259 10*3/uL (ref 150–400)
RBC: 3.31 MIL/uL — ABNORMAL LOW (ref 4.22–5.81)
RDW: 15.3 % (ref 11.5–15.5)
WBC: 13.9 10*3/uL — ABNORMAL HIGH (ref 4.0–10.5)
nRBC: 0 % (ref 0.0–0.2)

## 2022-01-24 LAB — RENAL FUNCTION PANEL
Albumin: 3 g/dL — ABNORMAL LOW (ref 3.5–5.0)
Anion gap: 17 — ABNORMAL HIGH (ref 5–15)
BUN: 73 mg/dL — ABNORMAL HIGH (ref 8–23)
CO2: 26 mmol/L (ref 22–32)
Calcium: 10.9 mg/dL — ABNORMAL HIGH (ref 8.9–10.3)
Chloride: 97 mmol/L — ABNORMAL LOW (ref 98–111)
Creatinine, Ser: 5.35 mg/dL — ABNORMAL HIGH (ref 0.61–1.24)
GFR, Estimated: 10 mL/min — ABNORMAL LOW (ref >60)
Glucose, Bld: 114 mg/dL — ABNORMAL HIGH (ref 70–99)
Phosphorus: 4.6 mg/dL (ref 2.5–4.6)
Potassium: 5 mmol/L (ref 3.5–5.1)
Sodium: 140 mmol/L (ref 135–145)

## 2022-01-24 LAB — POCT I-STAT 7, (LYTES, BLD GAS, ICA,H+H)
Acid-Base Excess: 2 mmol/L (ref 0.0–2.0)
Bicarbonate: 26 mmol/L (ref 20.0–28.0)
Calcium, Ion: 1.34 mmol/L (ref 1.15–1.40)
HCT: 27 % — ABNORMAL LOW (ref 39.0–52.0)
Hemoglobin: 9.2 g/dL — ABNORMAL LOW (ref 13.0–17.0)
O2 Saturation: 99 %
Patient temperature: 98
Potassium: 4.9 mmol/L (ref 3.5–5.1)
Sodium: 136 mmol/L (ref 135–145)
TCO2: 27 mmol/L (ref 22–32)
pCO2 arterial: 35 mmHg (ref 32–48)
pH, Arterial: 7.478 — ABNORMAL HIGH (ref 7.35–7.45)
pO2, Arterial: 149 mmHg — ABNORMAL HIGH (ref 83–108)

## 2022-01-24 LAB — GLUCOSE, CAPILLARY
Glucose-Capillary: 116 mg/dL — ABNORMAL HIGH (ref 70–99)
Glucose-Capillary: 138 mg/dL — ABNORMAL HIGH (ref 70–99)
Glucose-Capillary: 169 mg/dL — ABNORMAL HIGH (ref 70–99)
Glucose-Capillary: 172 mg/dL — ABNORMAL HIGH (ref 70–99)
Glucose-Capillary: 177 mg/dL — ABNORMAL HIGH (ref 70–99)
Glucose-Capillary: 193 mg/dL — ABNORMAL HIGH (ref 70–99)
Glucose-Capillary: 86 mg/dL (ref 70–99)

## 2022-01-24 LAB — MRSA NEXT GEN BY PCR, NASAL: MRSA by PCR Next Gen: NOT DETECTED

## 2022-01-24 MED ORDER — ENOXAPARIN SODIUM 30 MG/0.3ML IJ SOSY: 30.0000 mg | PREFILLED_SYRINGE | INTRAMUSCULAR | Status: DC

## 2022-01-24 MED ORDER — CHLORHEXIDINE GLUCONATE CLOTH 2 % EX PADS
6.0000 | MEDICATED_PAD | Freq: Every day | CUTANEOUS | Status: DC
  Administered 2022-01-25 – 2022-01-27 (×4): 6 via TOPICAL

## 2022-01-24 MED ORDER — POLYETHYLENE GLYCOL 3350 17 G PO PACK: 17.0000 g | PACK | Freq: Every day | ORAL | Status: DC

## 2022-01-24 MED ORDER — MIDAZOLAM HCL 2 MG/2ML IJ SOLN
INTRAMUSCULAR | Status: AC
  Administered 2022-01-24: 2 mg
  Filled 2022-01-24: qty 2

## 2022-01-24 MED ORDER — MIDAZOLAM HCL 2 MG/2ML IJ SOLN
INTRAMUSCULAR | Status: AC
  Filled 2022-01-24: qty 2

## 2022-01-24 MED ORDER — ROCURONIUM BROMIDE 10 MG/ML (PF) SYRINGE
PREFILLED_SYRINGE | INTRAVENOUS | Status: AC
  Administered 2022-01-24: 100 mg
  Filled 2022-01-24: qty 10

## 2022-01-24 MED ORDER — DOCUSATE SODIUM 50 MG/5ML PO LIQD
100.0000 mg | Freq: Two times a day (BID) | ORAL | Status: DC
  Administered 2022-01-24 – 2022-01-27 (×2): 100 mg
  Filled 2022-01-24 (×3): qty 10

## 2022-01-24 MED ORDER — DEXMEDETOMIDINE HCL IN NACL 400 MCG/100ML IV SOLN
0.0000 ug/kg/h | INTRAVENOUS | Status: DC
  Administered 2022-01-24: 0.1 ug/kg/h via INTRAVENOUS
  Administered 2022-01-25: 0.3 ug/kg/h via INTRAVENOUS
  Filled 2022-01-24 (×3): qty 100

## 2022-01-24 MED ORDER — HEPARIN SODIUM (PORCINE) 5000 UNIT/ML IJ SOLN
5000.0000 [IU] | Freq: Two times a day (BID) | INTRAMUSCULAR | Status: DC
  Administered 2022-01-24 – 2022-01-30 (×12): 5000 [IU] via SUBCUTANEOUS
  Filled 2022-01-24 (×12): qty 1

## 2022-01-24 MED ORDER — FENTANYL CITRATE PF 50 MCG/ML IJ SOSY: 25.0000 ug | PREFILLED_SYRINGE | INTRAMUSCULAR | Status: DC | PRN

## 2022-01-24 MED ORDER — MIDAZOLAM HCL 2 MG/2ML IJ SOLN: 2.0000 mg | Freq: Once | INTRAMUSCULAR | Status: AC

## 2022-01-24 MED ORDER — FENTANYL CITRATE PF 50 MCG/ML IJ SOSY
PREFILLED_SYRINGE | INTRAMUSCULAR | Status: AC
  Filled 2022-01-24: qty 1

## 2022-01-24 MED ORDER — ETOMIDATE 2 MG/ML IV SOLN
INTRAVENOUS | Status: AC
  Administered 2022-01-24: 20 mg
  Filled 2022-01-24: qty 20

## 2022-01-24 MED ORDER — METOPROLOL SUCCINATE ER 25 MG PO TB24: 12.5000 mg | ORAL_TABLET | Freq: Every day | ORAL | Status: DC

## 2022-01-24 NOTE — Procedures (Signed)
Intubation Procedure Note  Jeffrey Campbell  [id number]  29-Jun-1932  Date:01/24/22  Time:5:26 PM   Provider Performing:Gerell Fortson, Otho Ket T    Procedure: Intubation (83358)  Indication(s) Respiratory Failure  Consent Unable to obtain consent due to emergent nature of procedure.   Anesthesia None   Time Out Verified patient identification, verified procedure, site/side was marked, verified correct patient position, special equipment/implants available, medications/allergies/relevant history reviewed, required imaging and test results available.   Sterile Technique Usual hand hygeine, masks, and gloves were used   Procedure Description Patient positioned in bed supine.  Sedation given as noted above.  Patient was intubated with endotracheal tube using  Direct Laryngyscope .  View was Grade 3 only epiglottis .  Number of attempts was 1.  Colorimetric CO2 detector was consistent with tracheal placement.   Complications/Tolerance None; patient tolerated the procedure well.    EBL Minimal   Specimen(s) None

## 2022-01-24 NOTE — Code Documentation (Signed)
Came to re-assess patient after report from nursing as below.  Patient initially responsive and verbal with 1 or 2 words--no resp distress  Then O2 sat dropped, eyes rolled back and started to choke and cough--agonal breathing noted   CODE blue called---Ambu bagged-no [pulse and rhythm PEA=--ACLS undertaken from 15:54-->15:59 with ROSC  Long discussion with daughter on phone about recurrent mucus pluggin [see cxr] and decompensation and that this recurrent nature point to likely terminal illness with exceedingly poor chance for recovery  Advised that if extubated, would be a one-way extubation next time.  Dr. Tacy Learn CCM on floor and coordinating transfer  Appreciate excellent nursing care and diligence  CRITICAL CARE Performed by: Nita Sells   Total critical care time: 25 minutes  Critical care time was exclusive of separately billable procedures and treating other patients.  Critical care was necessary to treat or prevent imminent or life-threatening deterioration.  Critical care was time spent personally by me on the following activities: development of treatment plan with patient and/or surrogate as well as nursing, discussions with consultants, evaluation of patient's response to treatment, examination of patient, obtaining history from patient or surrogate, ordering and performing treatments and interventions, ordering and review of laboratory studies, ordering and review of radiographic studies, pulse oximetry and re-evaluation of patient's condition.    Verneita Griffes, MD [facility] 4:30 PM

## 2022-01-24 NOTE — Progress Notes (Signed)
Nephrology Progress Note:  Subjective:   Moved to the floor yesterday.  Last HD on 10/27 with 3.0 kg UF.   His RN has contacted his primary team and has stopped his tube feeds as there is concern that he is actively aspirating.  Team has ordered a chest xray     Review of systems:   Unable to obtain - He does not answer questions and has minimal interaction with examiner -------------------------- Notable recent events:  suffered PEA cardiac arrest on 10/21 and was intubated.  CT on 10/22 noted small CVA.  MRI with multiple infarcts c/w shower of microemboli. He had a bronchoscopy on 10/25 with thick mucoid secretions bilaterally.     Objective Vital signs in last 24 hours: Vitals:   01/24/22 0200 01/24/22 0414 01/24/22 0700 01/24/22 0818  BP:  (!) 142/62 (!) 147/62 (!) 146/66  Pulse: 70 70 70 70  Resp: (!) 25 (!) 22 (!) 23 (!) 21  Temp:  98.8 F (37.1 C) 98 F (36.7 C)   TempSrc:  Oral Oral   SpO2: 99% 100% 100% 100%  Weight:  58 kg    Height:       Weight change: 0.3 kg  Intake/Output Summary (Last 24 hours) at 01/24/2022 1515 Last data filed at 01/23/2022 2045 Gross per 24 hour  Intake 153.56 ml  Output 435 ml  Net -281.44 ml    Dialysis Orders:  MWF  - [facility]  4hrs, BFR 400, DFR AF1.5,  EDW 54.5kg, 2K/ 2Ca   Access: AVF  Heparin 3000 unit bolus Calcitriol 0.5 mcg PO qHD Sensipar '120mg'$  qHD     Assessment/Plan: AMS - likely 2/2 bacteremia and PNA. S/p EEG with no seizure activity. Hx dementia.   Acute respiratory failure - intubated initially. Concern for aspiration on CT. Able to be extubated 10/18 however required reintubation for cardiac arrest on 10/21.  Extubated 10/26.  Optimize volume with HD as well.  S/p bronch with thick secretions.  Supportive care per primary team.  There is concern for recurrent aspiration   Aspiration PNA - noted on CT. Abx per primary team.  Optimize volume status.   Bacteremia - BC +streptococcus pneumoniae. Abx per critical care.  appears he completed course ESRD -  On HD per MWF schedule.  Had midodrine with last HD and may be able to stop same.  K rising.  His tube feeds are now off due to the aspiration concern.  Stopped a nutrition supplement  Hypotension - optimize volume with HD. He is now off of scheduled midodrine  Anemia of CKD - off of ESA now in setting of acute stroke.  No iron in setting of acute infection. (Note he did get aranesp 10/20 prior to that) Secondary Hyperparathyroidism - paused calcitriol with hypercalcemia.  No binders now. Limited to baths available inpatient  Acute CVA - MRI with multiple infarcts c/w shower of microemboli.  Per primary team and neuro  CAD, CHB s/p PPM DMT2 - per primary team  COPD - noted decreased resp reserve  Disposition - per primary team.  Would encourage goals of care discussions with hx dementia, recurrent aspiration events requiring prior intubation and now with concern for aspiration again.    Labs: Basic Metabolic Panel: Recent Labs  Lab 01/22/22 0208 01/23/22 0100 01/24/22 0134  NA 137 137 140  K 4.5 4.5 5.0  CL 97* 96* 97*  CO2 '27 26 26  '$ GLUCOSE 172* 143* 114*  BUN 62* 45* 73*  CREATININE 5.22* 3.69*  5.35*  CALCIUM 10.0 10.3 10.9*  PHOS 4.5 3.8 4.6   Liver Function Tests: Recent Labs  Lab 01/22/22 0208 01/23/22 0100 01/24/22 0134  ALBUMIN 2.5* 3.0* 3.0*   No results for input(s): "LIPASE", "AMYLASE" in the last 168 hours. No results for input(s): "AMMONIA" in the last 168 hours. CBC: Recent Labs  Lab 01/18/22 0714 01/19/22 0316 01/20/22 0813 01/21/22 0524 01/22/22 0208 01/23/22 0100 01/24/22 0134  WBC 8.6 14.0* 12.3* 9.8 11.3* 13.0* 13.9*  NEUTROABS 6.7 11.0*  --   --   --   --   --   HGB 8.8* 10.0* 9.4* 8.8* 9.1* 9.4* 10.4*  HCT 27.1* 31.0* 27.7* 26.3* 28.3* 29.2* 33.4*  MCV 101.1* 100.6* 98.2 98.9 101.1* 102.1* 100.9*  PLT 89* 102* 128* 148* 192 223 259   Cardiac Enzymes: No results for input(s): "CKTOTAL", "CKMB",  "CKMBINDEX", "TROPONINI" in the last 168 hours. CBG: Recent Labs  Lab 01/23/22 2011 01/24/22 0021 01/24/22 0411 01/24/22 0749 01/24/22 1207  GLUCAP 174* 116* 193* 172* 177*    Iron Studies: No results for input(s): "IRON", "TIBC", "TRANSFERRIN", "FERRITIN" in the last 72 hours. Studies/Results: No results found. Medications: Infusions:  sodium chloride Stopped (01/23/22 1921)   feeding supplement (PEPTAMEN 1.5 CAL) 1,000 mL (01/24/22 0842)    Scheduled Medications:   stroke: early stages of recovery book   Does not apply Once   aspirin  81 mg Per Tube Daily   atorvastatin  20 mg Per Tube Daily   brinzolamide  1 drop Both Eyes BID   And   brimonidine  1 drop Both Eyes BID   Chlorhexidine Gluconate Cloth  6 each Topical Q0600   donepezil  10 mg Per Tube QHS   feeding supplement (PROSource TF20)  60 mL Per Tube Daily   fiber supplement (BANATROL TF)  60 mL Per Tube BID   heparin injection (subcutaneous)  5,000 Units Subcutaneous Q12H   insulin aspart  0-15 Units Subcutaneous Q4H   insulin aspart  2 Units Subcutaneous Q4H   levothyroxine  150 mcg Per Tube Q0600   memantine  10 mg Per Tube BID   metoprolol succinate  12.5 mg Oral Daily   multivitamin  1 tablet Per Tube QHS   mouth rinse  15 mL Mouth Rinse 4 times per day   pantoprazole  40 mg Per Tube Daily    have reviewed scheduled and prn medications.  Physical Exam:      General adult male in bed chronically ill appearing HEENT normocephalic atraumatic sclera anicteric; liquid which is similar to tube feeds is coming out of his mouth - RN is cleaning him up and has reached out to primary  Neck supple trachea midline Lungs rhonchi bilaterally; on supp oxygen Heart S1S2 no rub Abdomen soft nontender nondistended Extremities no pitting edema  Psych no anxiety or agitation  Neuro - does not answer orienting questions; minimal interaction with examiner; intermittently tracks visually  Access: LUE AVF bruit and thrill     Claudia Desanctis, MD 01/24/2022, 3:40 PM

## 2022-01-24 NOTE — Progress Notes (Deleted)
Interaction with daughter over telephone at times punctuated by her verbal outbursts of "how come you dont know anything about my father"  "this is all in the chart" Her manner was verbally aggressive and accusatory   See separate nursing charting  Came to floor---patient daughter addressed and clarified to her how sick he is.  She seemed a little calmer and more amenable. I explained we are all working together for her Dad's benefit and to grant understanding and tolerance to Staff talking care of her Dad I asked her to take notes of our conversations so that she remembers important facts   She seemed less emotional about the state of her father and his care, and seemd to understand  [facility], Charge and RN aware.    Under no circumstances should belligerent behavior be tolerated and this will be expressed to patient family member if recurs and specific protocol as per [facility] to deal with these issues should be followed

## 2022-01-24 NOTE — Progress Notes (Signed)
Pt noted to have tube feeds drooling out of his mouth and aspiration suspected. Tube feeds held, provider notified and CXR ordered. HOB was maintained at no less than 30 degrees since beginning of shift, currently at 45 degrees. Pt bathed and all linen changed. 97% SA02 on 2L [place].

## 2022-01-24 NOTE — Progress Notes (Signed)
   01/24/22 1550  Clinical Encounter Type  Visited With Patient not available  Visit Type Initial;Code  Referral From Nurse  Consult/Referral To Chaplain   Chaplain responded to a cade blue. Patient was under the care of the medical, No family is present. If a chaplain is requested someone will respond.    Danice Goltz [facility]  [phone]

## 2022-01-24 NOTE — Progress Notes (Signed)
I was assessing Mr Jagoda with Dr Verlon Au after a post-suspected aspiration when his breathing suddenly became agonal then stopped. SA02 dropped from 90 to 17% in a matter of seconds. Chest PT attempted and his 02 briefly increased to 53% before dropping again. No palpable pulse noted. Code called and CPR started immediately. ROSC after 5 minutes. Pt transferred to ICU 2H05. Report given to ICU RN. All questions and concerns addressed. Relinquishing care.

## 2022-01-24 NOTE — Progress Notes (Signed)
Interaction with daughter over telephone at times punctuated by her verbal outbursts of "how come you dont know anything about my father"  "this is all in the chart" Her manner was verbally aggressive and accusatory   See separate nursing charting  Came to floor---patient daughter addressed and clarified to her how sick he is.  She seemed a little calmer and more amenable. I explained we are all working together for her Dad's benefit and to grant understanding and tolerance to Staff talking care of her Dad I asked her to take notes of our conversations so that she remembers important facts   She seemed less emotional about the state of her father and his care, and seemd to understand  [facility], Charge and RN aware.    Under no circumstances should belligerent behavior be tolerated and this will be expressed to patient family member if recurs and specific protocol as per [facility] to deal with these issues should be followed

## 2022-01-24 NOTE — Progress Notes (Deleted)
Daughter and son in law came to visit their dad. I was in the middle of performing hygiene care. Daughter visibly upset and vocal and unappeasable. Charge RN Janett Billow came to aid in hygiene care and was also verbally aggressed. Dtr still unappeasable. MD & [facility] aware and en route. Director notified.

## 2022-01-24 NOTE — Progress Notes (Addendum)
Daughter and son in law came to visit their dad. I was in the middle of performing hygiene care. Daughter visibly upset and vocal and unappeasable. Charge RN Janett Billow came to aid in hygiene care and was also verbally aggressed. Dtr still unappeasable. MD & [facility] aware and en route. Director notified.

## 2022-01-24 NOTE — Progress Notes (Signed)
PROGRESS NOTE   Jeffrey Campbell  CZY:[id number] DOB: 12/16/1932 DOA: 01/11/2022 PCP: Lawerance Cruel, MD  Brief Narrative:   88 year old male HTN DM TY 2 Prior a flutter complete heart block status post PPM 02/10/2019-on chronic Eliquis previously but not taking at time of admission CAD left heart cath 2012 ostial LAD Mitral valve prolapse with MR ESRD MWF OSA supposed to be on CPAP but noncompliant Hypothyroidism Underlying vascular dementia  Found unresponsive 10/17 making gurgling sound and remained hypotensive despite fluid and was intubated and placed on Levophed Lactic acid was 3.4 no leukocytosis BNP 1904 CXR left lower lobe infiltrate CT head   [-] CT abdomen pelvis confirmed lower lobe infiltrate and patient started cefepime  10/19 extubated 10/21 PEA arrest X 4 minutes, IO placed-reintubated with mucous plugging--chest x-ray whiteout on the left side--- bronchoalveolar lavage performed with copious secretions very difficult to suction 10/22 CT head small CVA inferior left cerebellum-neurology consulted-placed on aspirin as poor candidate for anticoagulant- 10/24 MRI confirmed bilateral cerebral embolic stroke 09/32 repeat bronchoscopy thick secretions plugging of airways 10/26 extubated   Hospital-Problem based course  Septic shock on admission etiology aspiration + pneumococcal pneumonia on blood cultures 10/21 Staph epidermidis contaminant 10/2 BAL Hypoxic respiratory failure this admission intubated 10/17, 10/21 Remains very ill ---leukocytosis persists-stopped ceftriaxone 10/26-SLP to follow but unlikely will be able to graduate to oral feeds unless significantly wakes up more cooperative Shock physiology improved since 7/21 Hypoxia slightly better now on 3 L nasal cannula but work of breathing is increased Watch carefully on progressive unit high risk for decompensation--May require extended course antibiotics Get CXR in a.m.--Feel he is aspirating on his own  secretions--- if no clinical progression may need to involve palliative care and discussion  PEA arrest 10/21 ROSC 4 minutes Add low-dose metoprolol 12.5 XL via tube Periodic electrolytes  Acute cerebellar infarcts this admission Etiology likely secondary to atrial fibrillation and acute illness On atorvastatin aspirin 81 Not a good candidate for anticoagulation given high risk of bleeding/fall  Nonobstructive CAD on cath 3557 Acute systolic heart failure this admission EF 45-50% global hypokinesis Prior a flutter with complete heart block and PPM [facility] 02/10/2019-CHA2DS2-VASc >4 not on anticoagulation cannot Starting Toprol-XL as above--seems to be predominantly in V paced rhythm  ESRD MWF Defer to renal Slightly hyperkalemic today phosphorus okay, hemoglobin okay  Hypothyroid Give Synthroid 150 mcg into  Hyperglycemia DM TY 2 Mild-not on any meds prior to admission Sugars 1 16-1 19 continue only sliding scale at this time hopeful to graduate although not confident to oral diet  Dementia over the past 2 years Continue Aricept 10,Namenda 10 twice daily Stopping all meds which have medication such as fentanyl  Severe protein energy malnutrition, likely tube feed related diarrhea Tube feeds as per the protocol  DVT prophylaxis: Lovenox Code Status: Full code confirmed Family Communication: Discussed with patient's daughter on telephone Genevieve Norlander [phone] and brought her up to speed Disposition:  Status is: Inpatient Remains inpatient appropriate because:   Remains ill and need to see how patient responds   Consultants:  Nephrology Cardiology  Procedures: Multiple  Antimicrobials:     Subjective: Responds by saying "yes" but does not orient Seems quite drowsy This does not seem changed from prior I am unable to get any meaningful ROS  Objective: Vitals:   01/24/22 0200 01/24/22 0414 01/24/22 0700 01/24/22 0818  BP:  (!) 142/62 (!) 147/62 (!)  146/66  Pulse: 70 70 70 70  Resp: Marland Kitchen)  25 (!) 22 (!) 23 (!) 21  Temp:  98.8 F (37.1 C) 98 F (36.7 C)   TempSrc:  Oral Oral   SpO2: 99% 100% 100% 100%  Weight:  58 kg    Height:        Intake/Output Summary (Last 24 hours) at 01/24/2022 1025 Last data filed at 01/23/2022 2045 Gross per 24 hour  Intake 213.57 ml  Output 435 ml  Net -221.43 ml   Filed Weights   01/22/22 1143 01/23/22 0500 01/24/22 0414  Weight: 54.7 kg 54.8 kg 58 kg    Examination:  EOMI NCAT no focal deficit, no icterus no pallor Coarse breath sounds bilaterally with increased work of breathing bilaterally Core track in place left nare Abdomen is soft no rebound no guarding No lower extremity edema Did not examine sacrum lower extremity seems soft supple no edema   Data Reviewed: personally reviewed   CBC    Component Value Date/Time   WBC 13.9 (H) 01/24/2022 0134   RBC 3.31 (L) 01/24/2022 0134   HGB 10.4 (L) 01/24/2022 0134   HGB 10.4 (L) 07/21/2016 1037   HCT 33.4 (L) 01/24/2022 0134   HCT 27.7 (L) 07/25/2019 1604   PLT 259 01/24/2022 0134   PLT 178 07/21/2016 1037   MCV 100.9 (H) 01/24/2022 0134   MCV 90 07/21/2016 1037   MCH 31.4 01/24/2022 0134   MCHC 31.1 01/24/2022 0134   RDW 15.3 01/24/2022 0134   RDW 14.7 07/21/2016 1037   LYMPHSABS 1.2 01/19/2022 0316   MONOABS 1.4 (H) 01/19/2022 0316   EOSABS 0.2 01/19/2022 0316   BASOSABS 0.1 01/19/2022 0316      Latest Ref Rng & Units 01/24/2022    1:34 AM 01/23/2022    1:00 AM 01/22/2022    2:08 AM  CMP  Glucose 70 - 99 mg/dL 114  143  172   BUN 8 - 23 mg/dL 73  45  62   Creatinine 0.61 - 1.24 mg/dL 5.35  3.69  5.22   Sodium 135 - 145 mmol/L 140  137  137   Potassium 3.5 - 5.1 mmol/L 5.0  4.5  4.5   Chloride 98 - 111 mmol/L 97  96  97   CO2 22 - 32 mmol/L _0 Calcium 8.9 - 10.3 mg/dL 10.9  10.3  10.0      Radiology Studies: No results found.   Scheduled Meds:   stroke: early stages of recovery book   Does not apply  Once   aspirin  81 mg Per Tube Daily   atorvastatin  20 mg Per Tube Daily   brinzolamide  1 drop Both Eyes BID   And   brimonidine  1 drop Both Eyes BID   Chlorhexidine Gluconate Cloth  6 each Topical Q0600   donepezil  10 mg Per Tube QHS   feeding supplement (PROSource TF20)  60 mL Per Tube Daily   fiber supplement (BANATROL TF)  60 mL Per Tube BID   insulin aspart  0-15 Units Subcutaneous Q4H   insulin aspart  2 Units Subcutaneous Q4H   levothyroxine  150 mcg Per Tube Q0600   memantine  10 mg Per Tube BID   multivitamin  1 tablet Per Tube QHS   mouth rinse  15 mL Mouth Rinse 4 times per day   pantoprazole  40 mg Per Tube Daily   Continuous Infusions:  sodium chloride Stopped (01/23/22 1921)   feeding supplement (PEPTAMEN 1.5 CAL)  1,000 mL (01/24/22 0842)     LOS: 12 days   Time spent: [place], MD [facility] To contact the attending provider between 7A-7P or the covering provider during after hours 7P-7A, please log into the web site [email or web address] and access using universal [place] password for that web site. If you do not have the password, please call the hospital operator.  01/24/2022, 10:25 AM

## 2022-01-24 NOTE — Procedures (Signed)
Bronchoscopy Procedure Note  Jeffrey Campbell  [id number]  04-15-1932  Date:01/24/22  Time:5:07 PM   Provider Performing:Inf[facility] Jeffords   Procedure(s):  Flexible Bronchoscopy [phone]) and Subsequent Therapeutic Aspiration of Tracheobronchial Tree (42103)  Indication(s) Acute respiratory failure due to mucous plugging  Consent Unable to obtain consent due to emergent nature of procedure.  Anesthesia Etomidate and rocuronium   Time Out Verified patient identification, verified procedure, site/side was marked, verified correct patient position, special equipment/implants available, medications/allergies/relevant history reviewed, required imaging and test results available.   Sterile Technique Usual hand hygiene, masks, gowns, and gloves were used   Procedure Description Bronchoscope advanced through endotracheal tube and into airway.  Airways were examined down to subsegmental level with findings noted below.   Following diagnostic evaluation, Therapeutic aspiration performed in left main bronchus  Findings: Copious amount of tenacious secretions noted throughout the airway, therapeutic aspiration was performed, mucosa looks lumpy bumpy   Complications/Tolerance None; patient tolerated the procedure well. Chest X-ray is needed post procedure.   EBL Minimal   Specimen(s) None

## 2022-01-24 NOTE — Progress Notes (Signed)
NAME:  Jeffrey Campbell, MRN:  [id number], DOB:  1932/05/10, LOS: 12 ADMISSION DATE:  01/11/2022, CONSULTATION DATE:  12/31/2021 REFERRING MD:  EDP, CHIEF COMPLAINT:  AMS. ARF   History of Present Illness:  88 year old male with extensive PMH including ESRD on HD MWF. Presents to ED on 10/17 after being found unresponsive at home. Per family patient recent admission 8/23 for altered mental status, UTI. Now with reported tiredness, weakness over last 24 hours. This morning when daughter arrived at patient home, she reports patient was unresponsive making gurgling sounds, on arrival to ED patient remains minimally responsive with tachypnea and hypotension requiring intubation. CXR with left lower lobe infiltrate. CT head negative. CT C/A/P with extensive bilateral infiltirates and noted left lower lobe infiltrate concerning for aspiration. Given Cefepime/Vancomycin. Critical Care Consulted for admission.  10/21 PCCM reconsulted for complete whiteout of left lung, transferred to ICU and intubated, brief PEA arrest with ROSC in  4 minutes I/O placed  Patient was extubated on 10/26, he remained generalized weak, unable to clear his secretions, he was watched in ICU for 48 hours, was transferred to progressive care unit on 10/28.  On 10/29 patient was found lethargic, minimally responsive, x-ray chest was done which showed whiteout left lung, critical care was consulted and in between patient went into respiratory arrest and coded, ROSC was achieved after 5 minutes, he was intubated and was transferred back to ICU for further care  Pertinent  Medical History  HTN, DMII, CAD, CHB h/o PPM, ESRD on HD MWF, COPD,   Significant Hospital Events: Including procedures, antibiotic start and stop dates in addition to other pertinent events   ETT 10/17-10/18 10/20 transferred out of ICU 10/21 PCCM reconsulted for complete whiteout of left lung, transferred to ICU and intubated, brief PEA arrest with ROSC in  4 minutes  I/O placed 10/22 CT Head: Small acute infarct in the inferior left cerebellum 10/24 MRI brain: Acute infarct of the left and right cerebellum, right pons as well as multiple scattered infarcts in both cerebral hemisphere 10/24 MRA head: Severe stenosis of L ICA, moderate stenosis of R ICA and moderate stenosis of distal right vertebral artery 10/25 CXR worsening lower lung atelectasis/infiltrate 10/25 bronchoscopy w/ BAL 10/26 extubated to nasal cannula  Interim History / Subjective:  Reintubated and transferred to ICU  Objective   Blood pressure (!) 140/74, pulse 70, temperature 98 F (36.7 C), temperature source Oral, resp. rate 19, height _0  (1.753 m), weight 58 kg, SpO2 100 %.    Vent Mode: PRVC FiO2 (%):  [100 %] 100 % Set Rate:  [20 bmp] 20 bmp Vt Set:  [560 mL] 560 mL PEEP:  [5 cmH20] 5 cmH20 Plateau Pressure:  [18 cmH20] 18 cmH20   Intake/Output Summary (Last 24 hours) at 01/24/2022 1710 Last data filed at 01/23/2022 2045 Gross per 24 hour  Intake 23.56 ml  Output 435 ml  Net -411.44 ml   Filed Weights   01/22/22 1143 01/23/22 0500 01/24/22 0414  Weight: 54.7 kg 54.8 kg 58 kg   Examination:   Physical exam: General: Crtitically ill-appearing male, orally intubated HEENT: [place]/AT, eyes anicteric.  ETT and OGT in place Neuro: Sedated, not following commands.  Eyes are closed.  Pupils 3 mm bilateral reactive to light Chest: Absent air entry on left side, rhonchorous on right side Heart: Irregularly irregular, no murmurs or gallops Abdomen: Soft, nondistended, bowel sounds present Skin: No rash  Resolved Hospital Problem list   Septic shock  Respiratory failure  Assessment & Plan:  S/p PEA cardiac arrest x 2 in the setting of hypoxia due to mucous plugging Acute hypoxic respiratory failure due to aspiration pneumonia and mucous plugging Pneumococcal pneumonia upon presentation  This is patient's third intubation as he is unable to clear his secretions, ended up  developing a large mucous plug, he went into respiratory distress on 10/21, requiring emergent intubation and bronchoscopy to clear large amount of thick secretions He was extubated and again today he was noted to have left lung whiteout, he was found minimally responsive, then went into cardiac arrest, ROSC was achieved after 5 minutes Continue lung protective ventilation Bronchoscopy was performed and mucous plug was removed, unfortunately patient is so weak he is not able to clear his secretions and this will be ongoing problem considering he has advanced dementia and generalized weakness despite frequent suctioning Palliative care consult is requested Patient completed antibiotic therapy for pneumonia Monitor vital signs if he spikes fever again, unfortunately he needs reinitiation of antibiotics PAD protocol with Precedex infusion and as needed fentanyl RASS goal -1 VAP bundle in place  Acute bilateral multiple strokes Acute septic encephalopathy  Advanced dementia Chronic atrial fibrillation, not on anticoagulation During this admission patient was noted to have bilateral acute multiple ischemic strokes He remains high risk of recurrent stroke due to chronic A-fib He is not on anticoagulation due to high risk of falls  Continue aspirin and statin Minimize sedation Heart rate remain well controlled   ESRD on HD MWF Anemia of Chronic Disease Nephrology is following, dialysis per schedule Monitor H&H and transfuse if less than 7   Demand cardiac ischemia CHB s/p pacemaker Acute systolic CHF Start GDMT when medically stable Continue telemetry monitoring  Hypothyroidism Continue levothyroxine   DMII Monitor fingerstick with goal 140-180 Continue sliding scale insulin    Severe protein calorie malnutrition Continue Tube feeds  Best Practice (right click and "Reselect all SmartList Selections" daily)   Diet/type: tubefeeds and NPO DVT prophylaxis: prophylactic heparin  GI  prophylaxis: PPI Lines: N/A Foley:  N/A Code Status:  full code Last date of multidisciplinary goals of care discussion [10/29 had detailed discussion with patient's daughter who is DPOA, explained that unfortunately patient does not have any ability to clear his own secretions and this will be ongoing problem.  Despite everything patient's daughter would like to continue full scope of care Palliative care consult is requested   Labs   CBC: Recent Labs  Lab 01/18/22 0714 01/19/22 0316 01/20/22 0813 01/21/22 0524 01/22/22 0208 01/23/22 0100 01/24/22 0134  WBC 8.6 14.0* 12.3* 9.8 11.3* 13.0* 13.9*  NEUTROABS 6.7 11.0*  --   --   --   --   --   HGB 8.8* 10.0* 9.4* 8.8* 9.1* 9.4* 10.4*  HCT 27.1* 31.0* 27.7* 26.3* 28.3* 29.2* 33.4*  MCV 101.1* 100.6* 98.2 98.9 101.1* 102.1* 100.9*  PLT 89* 102* 128* 148* 192 223 263    Basic Metabolic Panel: Recent Labs  Lab 01/20/22 0813 01/21/22 0524 01/22/22 0208 01/23/22 0100 01/24/22 0134  NA 136 137 137 137 140  K 4.2 3.9 4.5 4.5 5.0  CL 98 95* 97* 96* 97*  CO2 _0 GLUCOSE 254* 179* 172* 143* 114*  BUN 63* 44* 62* 45* 73*  CREATININE 5.11* 3.79* 5.22* 3.69* 5.35*  CALCIUM 9.9 10.1 10.0 10.3 10.9*  PHOS 3.4 3.1 4.5 3.8 4.6   GFR: Estimated Creatinine Clearance: 8.1 mL/min (A) (by C-G formula based  on SCr of 5.35 mg/dL (H)). Recent Labs  Lab 01/21/22 0524 01/22/22 0208 01/23/22 0100 01/24/22 0134  WBC 9.8 11.3* 13.0* 13.9*    Liver Function Tests: Recent Labs  Lab 01/20/22 0813 01/21/22 0524 01/22/22 0208 01/23/22 0100 01/24/22 0134  ALBUMIN 2.1* 2.6* 2.5* 3.0* 3.0*   No results for input(s): "LIPASE", "AMYLASE" in the last 168 hours. No results for input(s): "AMMONIA" in the last 168 hours.  ABG    Component Value Date/Time   PHART 7.497 (H) 01/16/2022 1309   PCO2ART 35.4 01/16/2022 1309   PO2ART 73 (L) 01/16/2022 1309   HCO3 27.5 01/16/2022 1309   TCO2 29 01/16/2022 1309   ACIDBASEDEF 5.0 (H)  07/28/2016 0850   ACIDBASEDEF 5.0 (H) 07/28/2016 0850   O2SAT 96 01/16/2022 1309     Coagulation Profile: No results for input(s): "INR", "PROTIME" in the last 168 hours.   Cardiac Enzymes: No results for input(s): "CKTOTAL", "CKMB", "CKMBINDEX", "TROPONINI" in the last 168 hours.  HbA1C: Hgb A1c MFr Bld  Date/Time Value Ref Range Status  12/30/2021 01:17 PM 5.2 4.8 - 5.6 % Final    Comment:    (NOTE) Pre diabetes:          5.7%-6.4%  Diabetes:              >6.4%  Glycemic control for   <7.0% adults with diabetes   09/19/2016 04:07 AM 5.5 4.8 - 5.6 % Final    Comment:    (NOTE)         Pre-diabetes: 5.7 - 6.4         Diabetes: >6.4         Glycemic control for adults with diabetes: <7.0     CBG: Recent Labs  Lab 01/24/22 0021 01/24/22 0411 01/24/22 0749 01/24/22 1207 01/24/22 1635  GLUCAP 116* 193* 172* 177* 169*    Total critical care time: 48 minutes  Performed by: [place] care time was exclusive of separately billable procedures and treating other patients.   Critical care was necessary to treat or prevent imminent or life-threatening deterioration.   Critical care was time spent personally by me on the following activities: development of treatment plan with patient and/or surrogate as well as nursing, discussions with consultants, evaluation of patient's response to treatment, examination of patient, obtaining history from patient or surrogate, ordering and performing treatments and interventions, ordering and review of laboratory studies, ordering and review of radiographic studies, pulse oximetry and re-evaluation of patient's condition.   [name], MD [facility] See Amion for pager If no response to pager, please call [phone] until 7pm After 7pm, Please call E-link [phone]

## 2022-01-25 ENCOUNTER — Inpatient Hospital Stay (HOSPITAL_COMMUNITY): Payer: Medicare Other

## 2022-01-25 DIAGNOSIS — I5021 Acute systolic (congestive) heart failure: Secondary | ICD-10-CM | POA: Diagnosis not present

## 2022-01-25 DIAGNOSIS — J9601 Acute respiratory failure with hypoxia: Secondary | ICD-10-CM | POA: Diagnosis not present

## 2022-01-25 DIAGNOSIS — G9340 Encephalopathy, unspecified: Secondary | ICD-10-CM | POA: Diagnosis not present

## 2022-01-25 DIAGNOSIS — I469 Cardiac arrest, cause unspecified: Secondary | ICD-10-CM | POA: Diagnosis not present

## 2022-01-25 LAB — CBC
HCT: 31.2 % — ABNORMAL LOW (ref 39.0–52.0)
Hemoglobin: 9.8 g/dL — ABNORMAL LOW (ref 13.0–17.0)
MCH: 31.9 pg (ref 26.0–34.0)
MCHC: 31.4 g/dL (ref 30.0–36.0)
MCV: 101.6 fL — ABNORMAL HIGH (ref 80.0–100.0)
Platelets: 294 10*3/uL (ref 150–400)
RBC: 3.07 MIL/uL — ABNORMAL LOW (ref 4.22–5.81)
RDW: 15.2 % (ref 11.5–15.5)
WBC: 14.8 10*3/uL — ABNORMAL HIGH (ref 4.0–10.5)
nRBC: 0 % (ref 0.0–0.2)

## 2022-01-25 LAB — GLUCOSE, CAPILLARY
Glucose-Capillary: 109 mg/dL — ABNORMAL HIGH (ref 70–99)
Glucose-Capillary: 122 mg/dL — ABNORMAL HIGH (ref 70–99)
Glucose-Capillary: 148 mg/dL — ABNORMAL HIGH (ref 70–99)
Glucose-Capillary: 155 mg/dL — ABNORMAL HIGH (ref 70–99)
Glucose-Capillary: 90 mg/dL (ref 70–99)
Glucose-Capillary: 96 mg/dL (ref 70–99)

## 2022-01-25 LAB — RENAL FUNCTION PANEL
Albumin: 2.8 g/dL — ABNORMAL LOW (ref 3.5–5.0)
Anion gap: 18 — ABNORMAL HIGH (ref 5–15)
BUN: 104 mg/dL — ABNORMAL HIGH (ref 8–23)
CO2: 23 mmol/L (ref 22–32)
Calcium: 10.7 mg/dL — ABNORMAL HIGH (ref 8.9–10.3)
Chloride: 99 mmol/L (ref 98–111)
Creatinine, Ser: 6.97 mg/dL — ABNORMAL HIGH (ref 0.61–1.24)
GFR, Estimated: 7 mL/min — ABNORMAL LOW (ref >60)
Glucose, Bld: 90 mg/dL (ref 70–99)
Phosphorus: 5.7 mg/dL — ABNORMAL HIGH (ref 2.5–4.6)
Potassium: 5.6 mmol/L — ABNORMAL HIGH (ref 3.5–5.1)
Sodium: 140 mmol/L (ref 135–145)

## 2022-01-25 MED ORDER — ORAL CARE MOUTH RINSE: 15.0000 mL | OROMUCOSAL | Status: DC | PRN

## 2022-01-25 MED ORDER — SODIUM ZIRCONIUM CYCLOSILICATE 10 G PO PACK
10.0000 g | PACK | Freq: Once | ORAL | Status: AC
  Administered 2022-01-25: 10 g
  Filled 2022-01-25: qty 1

## 2022-01-25 MED ORDER — ORAL CARE MOUTH RINSE: 15.0000 mL | OROMUCOSAL | Status: DC

## 2022-01-25 MED ORDER — PEPTAMEN 1.5 CAL PO LIQD
1000.0000 mL | ORAL | Status: DC
  Administered 2022-01-25 – 2022-01-28 (×4): 1000 mL
  Filled 2022-01-25 (×8): qty 1000

## 2022-01-25 MED ORDER — ORAL CARE MOUTH RINSE
15.0000 mL | OROMUCOSAL | Status: DC
  Administered 2022-01-25 – 2022-01-30 (×46): 15 mL via OROMUCOSAL

## 2022-01-25 MED ORDER — CARVEDILOL 3.125 MG PO TABS
3.1250 mg | ORAL_TABLET | Freq: Two times a day (BID) | ORAL | Status: DC
  Administered 2022-01-25 – 2022-01-30 (×6): 3.125 mg
  Filled 2022-01-25 (×7): qty 1

## 2022-01-25 MED ORDER — PROSOURCE TF20 ENFIT COMPATIBL EN LIQD
60.0000 mL | Freq: Every day | ENTERAL | Status: DC
  Administered 2022-01-25 – 2022-01-30 (×6): 60 mL
  Filled 2022-01-25 (×6): qty 60

## 2022-01-25 NOTE — Progress Notes (Signed)
Called HD at [phone] for an update on pt.'s dialysis schedule for today. HD nurse states he will be here around 3 or 4am.

## 2022-01-25 NOTE — Progress Notes (Signed)
NAME:  Jeffrey Campbell, MRN:  [id number], DOB:  01-12-1933, LOS: 20 ADMISSION DATE:  01/11/2022, CONSULTATION DATE:  01/22/2022 REFERRING MD:  EDP, CHIEF COMPLAINT:  AMS. ARF   History of Present Illness:  88 year old male with extensive PMH including ESRD on HD MWF. Presents to ED on 10/17 after being found unresponsive at home. Per family patient recent admission 8/23 for altered mental status, UTI. Now with reported tiredness, weakness over last 24 hours. This morning when daughter arrived at patient home, she reports patient was unresponsive making gurgling sounds, on arrival to ED patient remains minimally responsive with tachypnea and hypotension requiring intubation. CXR with left lower lobe infiltrate. CT head negative. CT C/A/P with extensive bilateral infiltirates and noted left lower lobe infiltrate concerning for aspiration. Given Cefepime/Vancomycin. Critical Care Consulted for admission.  10/21 PCCM reconsulted for complete whiteout of left lung, transferred to ICU and intubated, brief PEA arrest with ROSC in  4 minutes I/O placed  Patient was extubated on 10/26, he remained generalized weak, unable to clear his secretions, he was watched in ICU for 48 hours, was transferred to progressive care unit on 10/28.  On 10/29 patient was found lethargic, minimally responsive, x-ray chest was done which showed whiteout left lung, critical care was consulted and in between patient went into respiratory arrest and coded, ROSC was achieved after 5 minutes, he was intubated and was transferred back to ICU for further care  Pertinent  Medical History  HTN, DMII, CAD, CHB h/o PPM, ESRD on HD MWF, COPD,   Significant Hospital Events: Including procedures, antibiotic start and stop dates in addition to other pertinent events   ETT 10/17-10/18 10/20 transferred out of ICU 10/21 PCCM reconsulted for complete whiteout of left lung, transferred to ICU and intubated, brief PEA arrest with ROSC in  4 minutes  I/O placed 10/22 CT Head: Small acute infarct in the inferior left cerebellum 10/24 MRI brain: Acute infarct of the left and right cerebellum, right pons as well as multiple scattered infarcts in both cerebral hemisphere 10/24 MRA head: Severe stenosis of L ICA, moderate stenosis of R ICA and moderate stenosis of distal right vertebral artery 10/25 CXR worsening lower lung atelectasis/infiltrate 10/25 bronchoscopy w/ BAL 10/26 extubated to nasal cannula 10/29 became hypoxic and went to PEA cardiac arrest, reintubated and transferred to ICU  Interim History / Subjective:  Sedation was turned off No overnight issues Remain on full mechanical ventilatory support  Objective   Blood pressure (!) 106/55, pulse 69, temperature 98.3 F (36.8 C), temperature source Oral, resp. rate 20, height _0  (1.753 m), weight 60 kg, SpO2 100 %.    Vent Mode: PRVC FiO2 (%):  [40 %-100 %] 40 % Set Rate:  [20 bmp] 20 bmp Vt Set:  [560 mL] 560 mL PEEP:  [5 cmH20-8 cmH20] 8 cmH20 Plateau Pressure:  [18 cmH20-24 cmH20] 22 cmH20   Intake/Output Summary (Last 24 hours) at 01/25/2022 0804 Last data filed at 01/24/2022 2300 Gross per 24 hour  Intake 80.01 ml  Output 38 ml  Net 42.01 ml   Filed Weights   01/23/22 0500 01/24/22 0414 01/25/22 0500  Weight: 54.8 kg 58 kg 60 kg   Examination:   Physical exam: General: Crtitically ill-appearing male, orally intubated HEENT: [place]/AT, eyes anicteric.  ETT and OGT in place Neuro: Eyes closed, does not open, not following commands, withdrawing in all 4 extremities Chest: Reduced air entry all over, no wheezes or rhonchi Heart: Irregularly irregular, no  murmurs or gallops Abdomen: Soft, nondistended, bowel sounds present Skin: No rash  Resolved Hospital Problem list   Septic shock  Assessment & Plan:  S/p PEA cardiac arrest x 2 in the setting of hypoxia due to mucous plugging Acute hypoxic respiratory failure due to aspiration pneumonia and mucous  plugging Pneumococcal pneumonia upon presentation  Continue lung protective ventilation VAP bundle in place Avoid deep sedation, RASS goal 0/-1  Continue goals of care discussion with family, palliative care consult is requested Had repeat bronchoscopy yesterday with removal of mucous plug Patient completed antibiotic therapy for pneumonia Monitor vital signs if he spikes fever again, unfortunately he needs reinitiation of antibiotics  Acute bilateral multiple strokes Acute septic encephalopathy  Advanced dementia Chronic atrial fibrillation, not on anticoagulation He remains high risk of recurrent stroke due to chronic A-fib He is not on anticoagulation due to high risk of falls  Continue aspirin and statin Minimize sedation Heart rate remain well controlled   ESRD on HD MWF Anemia of Chronic Disease Nephrology is following, dialysis per schedule Monitor H&H and transfuse if less than 7   Demand cardiac ischemia CHB s/p pacemaker Acute systolic CHF Started on low-dose Coreg Continue telemetry monitoring  Hypothyroidism Continue levothyroxine   DMII Monitor fingerstick with goal 140-180 Continue sliding scale insulin    Severe protein calorie malnutrition Continue Tube feeds  Best Practice (right click and "Reselect all SmartList Selections" daily)   Diet/type: tubefeeds and NPO DVT prophylaxis: prophylactic heparin  GI prophylaxis: PPI Lines: N/A Foley:  N/A Code Status:  full code Last date of multidisciplinary goals of care discussion [10/29 had detailed discussion with patient's daughter who is DPOA, explained that unfortunately patient does not have any ability to clear his own secretions and this will be ongoing problem.  Despite everything patient's daughter would like to continue full scope of care Palliative care consult is requested   Labs   CBC: Recent Labs  Lab 01/19/22 0316 01/20/22 0813 01/21/22 0524 01/22/22 0208 01/23/22 0100 01/24/22 0134  01/24/22 1807 01/25/22 0435  WBC 14.0*   < > 9.8 11.3* 13.0* 13.9*  --  14.8*  NEUTROABS 11.0*  --   --   --   --   --   --   --   HGB 10.0*   < > 8.8* 9.1* 9.4* 10.4* 9.2* 9.8*  HCT 31.0*   < > 26.3* 28.3* 29.2* 33.4* 27.0* 31.2*  MCV 100.6*   < > 98.9 101.1* 102.1* 100.9*  --  101.6*  PLT 102*   < > 148* 192 223 259  --  294   < > = values in this interval not displayed.    Basic Metabolic Panel: Recent Labs  Lab 01/21/22 0524 01/22/22 0208 01/23/22 0100 01/24/22 0134 01/24/22 1807 01/25/22 0435  NA 137 137 137 140 136 140  K 3.9 4.5 4.5 5.0 4.9 5.6*  CL 95* 97* 96* 97*  --  99  CO2 _0 --  23  GLUCOSE 179* 172* 143* 114*  --  90  BUN 44* 62* 45* 73*  --  104*  CREATININE 3.79* 5.22* 3.69* 5.35*  --  6.97*  CALCIUM 10.1 10.0 10.3 10.9*  --  10.7*  PHOS 3.1 4.5 3.8 4.6  --  5.7*   GFR: Estimated Creatinine Clearance: 6.5 mL/min (A) (by C-G formula based on SCr of 6.97 mg/dL (H)). Recent Labs  Lab 01/22/22 0208 01/23/22 0100 01/24/22 0134 01/25/22 0435  WBC 11.3* 13.0*  13.9* 14.8*    Liver Function Tests: Recent Labs  Lab 01/21/22 0524 01/22/22 0208 01/23/22 0100 01/24/22 0134 01/25/22 0435  ALBUMIN 2.6* 2.5* 3.0* 3.0* 2.8*   No results for input(s): "LIPASE", "AMYLASE" in the last 168 hours. No results for input(s): "AMMONIA" in the last 168 hours.  ABG    Component Value Date/Time   PHART 7.478 (H) 01/24/2022 1807   PCO2ART 35.0 01/24/2022 1807   PO2ART 149 (H) 01/24/2022 1807   HCO3 26.0 01/24/2022 1807   TCO2 27 01/24/2022 1807   ACIDBASEDEF 5.0 (H) 07/28/2016 0850   ACIDBASEDEF 5.0 (H) 07/28/2016 0850   O2SAT 99 01/24/2022 1807     Coagulation Profile: No results for input(s): "INR", "PROTIME" in the last 168 hours.   Cardiac Enzymes: No results for input(s): "CKTOTAL", "CKMB", "CKMBINDEX", "TROPONINI" in the last 168 hours.  HbA1C: Hgb A1c MFr Bld  Date/Time Value Ref Range Status  01/04/2022 01:17 PM 5.2 4.8 - 5.6 % Final     Comment:    (NOTE) Pre diabetes:          5.7%-6.4%  Diabetes:              >6.4%  Glycemic control for   <7.0% adults with diabetes   09/19/2016 04:07 AM 5.5 4.8 - 5.6 % Final    Comment:    (NOTE)         Pre-diabetes: 5.7 - 6.4         Diabetes: >6.4         Glycemic control for adults with diabetes: <7.0     CBG: Recent Labs  Lab 01/24/22 1635 01/24/22 2054 01/24/22 2340 01/25/22 0433 01/25/22 0752  GLUCAP 169* 138* 86 90 109*    Total critical care time: 39 minutes  Performed by: [place] care time was exclusive of separately billable procedures and treating other patients.   Critical care was necessary to treat or prevent imminent or life-threatening deterioration.   Critical care was time spent personally by me on the following activities: development of treatment plan with patient and/or surrogate as well as nursing, discussions with consultants, evaluation of patient's response to treatment, examination of patient, obtaining history from patient or surrogate, ordering and performing treatments and interventions, ordering and review of laboratory studies, ordering and review of radiographic studies, pulse oximetry and re-evaluation of patient's condition.   [name], MD [facility] See Amion for pager If no response to pager, please call [phone] until 7pm After 7pm, Please call E-link [phone]

## 2022-01-25 NOTE — Progress Notes (Signed)
Nutrition Follow-up  DOCUMENTATION CODES:   Severe malnutrition in context of chronic illness  INTERVENTION:   Tube Feeding via Cortrak: Resume TF  Peptamen 1.5 at 50 ml/hr Pro-source TF20 60 mL daily This provides 102 g of protein, 1880 kcals, 912 mL of free water  Continue Renal MVI  NUTRITION DIAGNOSIS:   Severe Malnutrition related to chronic illness (ESRD on HD, COPD) as evidenced by severe fat depletion, moderate muscle depletion, severe muscle depletion.  Being addressed via TF   GOAL:   Patient will meet greater than or equal to 90% of their needs  Progressing  MONITOR:   Diet advancement, I & O's, TF tolerance, Weight trends, Labs  REASON FOR ASSESSMENT:   Ventilator, Consult Enteral/tube feeding initiation and management  ASSESSMENT:   88 year old male with PMHx of peptic ulcer disease, HTN, HLD, CAD, ESRD on HD MWF, COPD, dementia, hypothyroidism, DM who presented after being found unresponsive at home with septic shock and acute hypoxic respiratory failure secondary to aspiration PNA, acute metabolic encephalopathy. Intubated 01/09/2022.  10/17 - intubated in ED 10/18 - extubated 10/20 - Cortrak placed (tip gastric) 10/21 - PEA arrest, intubated 10/25 - bronchoscopy (thick secretions bilaterally) 10/26 - extubated 10/29 - cardiac arrest, re-intubated  Pt remains on vent support Noted plan for iHD today  Cortrak remains in place, no active TF orders. Cortrack tube courses into abdomen on chest xray but tip not seem  EDW 54.5 kg; current wt 60 kg  Noted calcitriol on hold secondary to hypercalcemia. Binder therapy also on hold  Labs: corrected calcium 11.7 (H), phosphorus 5.7 Meds: banatrol, ss novolog, colace, lokelma x 1   Diet Order:   Diet Order             Diet NPO time specified  Diet effective now                   EDUCATION NEEDS:   Not appropriate for education at this time  Skin:  Skin Assessment: Skin Integrity  Issues: Skin Integrity Issues:: Stage I Stage I: sacrum  Last BM:  10/30-rectal tube remains in place  Height:   Ht Readings from Last 1 Encounters:  01/19/22 '5\' 9"'$  (1.753 m)    Weight:   Wt Readings from Last 1 Encounters:  01/25/22 60 kg    Ideal Body Weight:  72.7 kg  BMI:  Body mass index is 19.53 kg/m.  Estimated Nutritional Needs:   Kcal:  3009-2330  Protein:  90-100 grams  Fluid:  UOP + 1 L  Kerman Passey MS, RDN, LDN, CNSC Registered Dietitian 3 Clinical Nutrition RD Pager and On-Call Pager Number Located in [place]

## 2022-01-25 NOTE — Progress Notes (Signed)
Nephrology Progress Note:  Subjective:   Moved to the floor Sat. Had another PEA arrest yesterday 10/29 and now back in ICU on ventilator.   Review of systems:   Unable to obtain -------------------------- Notable recent events:  suffered PEA cardiac arrest on 10/21 and was intubated.  CT on 10/22 noted small CVA.  MRI with multiple infarcts c/w shower of microemboli. He had a bronchoscopy on 10/25 with thick mucoid secretions bilaterally.     Objective Vital signs in last 24 hours: Vitals:   01/25/22 0753 01/25/22 0800 01/25/22 0900 01/25/22 1118  BP:  102/69 114/84   Pulse:  68 70   Resp:  20 20   Temp: 98.3 F (36.8 C)   97.7 F (36.5 C)  TempSrc: Oral   Oral  SpO2:  100% 100%   Weight:      Height:       Weight change: 2 kg  Intake/Output Summary (Last 24 hours) at 01/25/2022 1128 Last data filed at 01/25/2022 0800 Gross per 24 hour  Intake 140.01 ml  Output 38 ml  Net 102.01 ml     Dialysis Orders:  MWF  - [facility]  4hrs, BFR 400, DFR AF1.5,  EDW 54.5kg, 2K/ 2Ca   Access: AVF  Heparin 3000 unit bolus Calcitriol 0.5 mcg PO qHD Sensipar '120mg'$  qHD     Assessment/Plan: AMS - likely 2/2 bacteremia and PNA. S/p EEG with no seizure activity. Also hx of recurrent PEA arrest, and hx of dementia.   Acute respiratory failure - intubated initially. Concern for aspiration on CT. Able to be extubated 10/18 however required reintubation for cardiac arrest on 10/21.  Extubated 10/26.  S/p bronch with thick secretions.  DC'd to floor 10/29 then had another PEA arrest 10/30 so is back on the ventilator again.  Shock -  on low dose levo gtt in ICU Aspiration PNA - noted on CT. Abx per primary team.  Optimize volume status.   Bacteremia - BC +streptococcus pneumoniae. SP course of abx ESRD -  On HD per MWF schedule.  HD today or tonight.   Hypotension - optimize volume with HD. He is now off of scheduled midodrine  Anemia of CKD - off of ESA now in setting of acute stroke.  No  iron in setting of acute infection. (Note he did get aranesp 10/20 prior to that) Secondary Hyperparathyroidism - paused calcitriol with hypercalcemia.  No binders now. Limited to baths available inpatient  Acute CVA - MRI with multiple infarcts c/w shower of microemboli.  Per primary team and neuro  CAD, CHB s/p PPM DMT2 - per primary team  COPD - noted decreased resp reserve  Kelly Splinter, MD 01/25/2022, 11:32 AM      Labs: Basic Metabolic Panel: Recent Labs  Lab 01/23/22 0100 01/24/22 0134 01/24/22 1807 01/25/22 0435  NA 137 140 136 140  K 4.5 5.0 4.9 5.6*  CL 96* 97*  --  99  CO2 26 26  --  23  GLUCOSE 143* 114*  --  90  BUN 45* 73*  --  104*  CREATININE 3.69* 5.35*  --  6.97*  CALCIUM 10.3 10.9*  --  10.7*  PHOS 3.8 4.6  --  5.7*    Liver Function Tests: Recent Labs  Lab 01/23/22 0100 01/24/22 0134 01/25/22 0435  ALBUMIN 3.0* 3.0* 2.8*    No results for input(s): "LIPASE", "AMYLASE" in the last 168 hours. No results for input(s): "AMMONIA" in the last 168 hours. CBC: Recent Labs  Lab 01/19/22 0316 01/20/22 0813 01/21/22 0524 01/22/22 0208 01/23/22 0100 01/24/22 0134 01/24/22 1807 01/25/22 0435  WBC 14.0*   < > 9.8 11.3* 13.0* 13.9*  --  14.8*  NEUTROABS 11.0*  --   --   --   --   --   --   --   HGB 10.0*   < > 8.8* 9.1* 9.4* 10.4* 9.2* 9.8*  HCT 31.0*   < > 26.3* 28.3* 29.2* 33.4* 27.0* 31.2*  MCV 100.6*   < > 98.9 101.1* 102.1* 100.9*  --  101.6*  PLT 102*   < > 148* 192 223 259  --  294   < > = values in this interval not displayed.   Medications: Infusions:  sodium chloride Stopped (01/23/22 1921)   dexmedetomidine (PRECEDEX) IV infusion Stopped (01/24/22 1726)    Scheduled Medications:   stroke: early stages of recovery book   Does not apply Once   aspirin  81 mg Per Tube Daily   atorvastatin  20 mg Per Tube Daily   brinzolamide  1 drop Both Eyes BID   And   brimonidine  1 drop Both Eyes BID   carvedilol  3.125 mg Per Tube BID WC    Chlorhexidine Gluconate Cloth  6 each Topical Q0600   Chlorhexidine Gluconate Cloth  6 each Topical Q0600   docusate  100 mg Per Tube BID   donepezil  10 mg Per Tube QHS   fiber supplement (BANATROL TF)  60 mL Per Tube BID   heparin injection (subcutaneous)  5,000 Units Subcutaneous Q12H   insulin aspart  0-15 Units Subcutaneous Q4H   insulin aspart  2 Units Subcutaneous Q4H   levothyroxine  150 mcg Per Tube Q0600   memantine  10 mg Per Tube BID   mouth rinse  15 mL Mouth Rinse Q2H   pantoprazole  40 mg Per Tube Daily   polyethylene glycol  17 g Per Tube Daily    have reviewed scheduled and prn medications.  Physical Exam:      Gen on vent, sedated No rash, cyanosis or gangrene Sclera anicteric, throat w/ ETT No jvd or bruits Chest clear anterior/ lateral RRR no MRG Abd soft ntnd no mass or ascites +bs GU normal MS no joint effusions or deformity Ext no UE/ LE edema, no wounds or ulcers Neuro is on vent, sedated  LUE AVF bruit and thrill

## 2022-01-25 NOTE — Progress Notes (Signed)
PT Cancellation Note  Patient Details Name: JAMIN PANTHER MRN: [id number] DOB: 11/08/1932   Cancelled Treatment:    Reason Eval/Treat Not Completed: Patient not medically ready, noted yesterday's events including reintubation; awaiting GOC discussion, etc. Spoke with Dr. Tacy Learn who is ok with PT signing off for now; MD to reorder as appropriate.  Mabeline Caras, PT, DPT Acute Rehabilitation Services  Personal: [facility] Office: [place] 01/25/2022, 8:26 AM

## 2022-01-26 DIAGNOSIS — G9340 Encephalopathy, unspecified: Secondary | ICD-10-CM | POA: Diagnosis not present

## 2022-01-26 DIAGNOSIS — I469 Cardiac arrest, cause unspecified: Secondary | ICD-10-CM | POA: Diagnosis not present

## 2022-01-26 DIAGNOSIS — J9601 Acute respiratory failure with hypoxia: Secondary | ICD-10-CM | POA: Diagnosis not present

## 2022-01-26 DIAGNOSIS — A419 Sepsis, unspecified organism: Secondary | ICD-10-CM | POA: Diagnosis not present

## 2022-01-26 LAB — RENAL FUNCTION PANEL
Albumin: 2.6 g/dL — ABNORMAL LOW (ref 3.5–5.0)
Anion gap: 21 — ABNORMAL HIGH (ref 5–15)
BUN: 138 mg/dL — ABNORMAL HIGH (ref 8–23)
CO2: 21 mmol/L — ABNORMAL LOW (ref 22–32)
Calcium: 10.3 mg/dL (ref 8.9–10.3)
Chloride: 98 mmol/L (ref 98–111)
Creatinine, Ser: 8.59 mg/dL — ABNORMAL HIGH (ref 0.61–1.24)
GFR, Estimated: 6 mL/min — ABNORMAL LOW (ref >60)
Glucose, Bld: 169 mg/dL — ABNORMAL HIGH (ref 70–99)
Phosphorus: 6 mg/dL — ABNORMAL HIGH (ref 2.5–4.6)
Potassium: 5.1 mmol/L (ref 3.5–5.1)
Sodium: 140 mmol/L (ref 135–145)

## 2022-01-26 LAB — GLUCOSE, CAPILLARY
Glucose-Capillary: 162 mg/dL — ABNORMAL HIGH (ref 70–99)
Glucose-Capillary: 169 mg/dL — ABNORMAL HIGH (ref 70–99)
Glucose-Capillary: 198 mg/dL — ABNORMAL HIGH (ref 70–99)
Glucose-Capillary: 146 mg/dL — ABNORMAL HIGH (ref 70–99)
Glucose-Capillary: 171 mg/dL — ABNORMAL HIGH (ref 70–99)

## 2022-01-26 LAB — CBC
HCT: 29.1 % — ABNORMAL LOW (ref 39.0–52.0)
Hemoglobin: 9.4 g/dL — ABNORMAL LOW (ref 13.0–17.0)
MCH: 32.2 pg (ref 26.0–34.0)
MCHC: 32.3 g/dL (ref 30.0–36.0)
MCV: 99.7 fL (ref 80.0–100.0)
Platelets: 296 10*3/uL (ref 150–400)
RBC: 2.92 MIL/uL — ABNORMAL LOW (ref 4.22–5.81)
RDW: 15.4 % (ref 11.5–15.5)
WBC: 12.3 10*3/uL — ABNORMAL HIGH (ref 4.0–10.5)
nRBC: 0 % (ref 0.0–0.2)

## 2022-01-26 MED ORDER — CHLORHEXIDINE GLUCONATE CLOTH 2 % EX PADS: 6.0000 | MEDICATED_PAD | Freq: Every day | CUTANEOUS | Status: DC

## 2022-01-26 MED ORDER — SODIUM CHLORIDE 0.9 % IV BOLUS: 300.0000 mL | INTRAVENOUS | Status: DC | PRN

## 2022-01-26 MED ORDER — FAMOTIDINE 20 MG PO TABS
20.0000 mg | ORAL_TABLET | Freq: Every day | ORAL | Status: DC
  Administered 2022-01-26 – 2022-01-30 (×5): 20 mg
  Filled 2022-01-26 (×5): qty 1

## 2022-01-26 MED ORDER — SODIUM CHLORIDE 0.9 % IV BOLUS: 300.0000 mL | Freq: Once | INTRAVENOUS | Status: DC

## 2022-01-26 NOTE — Progress Notes (Signed)
OT Cancellation Note  Patient Details Name: Jeffrey Campbell MRN: [id number] DOB: June 01, 1932   Cancelled Treatment:    Reason Eval/Treat Not Completed: Medical issues which prohibited therapy (Noted cardiac events, Per Dr. Tacy Learn, therapy to sign off and wait for new orders when appropriate.)  Elliot Cousin 01/26/2022, 7:54 AM
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
## 2022-01-26 NOTE — Progress Notes (Signed)
NAME:  Jeffrey Campbell, MRN:  [id number], DOB:  02-Sep-1932, LOS: 66 ADMISSION DATE:  01/15/2022, CONSULTATION DATE:  12/29/2021 REFERRING MD:  EDP, CHIEF COMPLAINT:  AMS. ARF   History of Present Illness:  88 year old male with extensive PMH including ESRD on HD MWF. Presents to ED on 10/17 after being found unresponsive at home. Per family patient recent admission 8/23 for altered mental status, UTI. Now with reported tiredness, weakness over last 24 hours. This morning when daughter arrived at patient home, she reports patient was unresponsive making gurgling sounds, on arrival to ED patient remains minimally responsive with tachypnea and hypotension requiring intubation. CXR with left lower lobe infiltrate. CT head negative. CT C/A/P with extensive bilateral infiltirates and noted left lower lobe infiltrate concerning for aspiration. Given Cefepime/Vancomycin. Critical Care Consulted for admission.  10/21 PCCM reconsulted for complete whiteout of left lung, transferred to ICU and intubated, brief PEA arrest with ROSC in  4 minutes I/O placed  Patient was extubated on 10/26, he remained generalized weak, unable to clear his secretions, he was watched in ICU for 48 hours, was transferred to progressive care unit on 10/28.  On 10/29 patient was found lethargic, minimally responsive, x-ray chest was done which showed whiteout left lung, critical care was consulted and in between patient went into respiratory arrest and coded, ROSC was achieved after 5 minutes, he was intubated and was transferred back to ICU for further care  Pertinent  Medical History  HTN, DMII, CAD, CHB h/o PPM, ESRD on HD MWF, COPD,   Significant Hospital Events: Including procedures, antibiotic start and stop dates in addition to other pertinent events   ETT 10/17-10/18 10/20 transferred out of ICU 10/21 PCCM reconsulted for complete whiteout of left lung, transferred to ICU and intubated, brief PEA arrest with ROSC in  4 minutes  I/O placed 10/22 CT Head: Small acute infarct in the inferior left cerebellum 10/24 MRI brain: Acute infarct of the left and right cerebellum, right pons as well as multiple scattered infarcts in both cerebral hemisphere 10/24 MRA head: Severe stenosis of L ICA, moderate stenosis of R ICA and moderate stenosis of distal right vertebral artery 10/25 CXR worsening lower lung atelectasis/infiltrate 10/25 bronchoscopy w/ BAL 10/26 extubated to nasal cannula 10/29 became hypoxic and went to PEA cardiac arrest, reintubated and transferred to ICU  Interim History / Subjective:  No overnight issues Remained afebrile  Objective   Blood pressure (!) 86/52, pulse 70, temperature (!) 97.2 F (36.2 C), temperature source Axillary, resp. rate 20, height _0  (1.753 m), weight 53 kg, SpO2 100 %.    Vent Mode: PRVC FiO2 (%):  [40 %] 40 % Set Rate:  [20 bmp] 20 bmp Vt Set:  [560 mL] 560 mL PEEP:  [5 cmH20] 5 cmH20 Plateau Pressure:  [17 cmH20-19 cmH20] 17 cmH20   Intake/Output Summary (Last 24 hours) at 01/26/2022 1055 Last data filed at 01/26/2022 0800 Gross per 24 hour  Intake 695.22 ml  Output 63 ml  Net 632.22 ml   Filed Weights   01/24/22 0414 01/25/22 0500 01/26/22 0500  Weight: 58 kg 60 kg 53 kg   Examination: Physical exam: General: Crtitically ill-appearing male, orally intubated HEENT: [place]/AT, eyes anicteric.  ETT and OGT in place Neuro: Eyes closed, does not open, opens with painful stimuli, not following commands, withdrawing in all 4 extremities Chest: Reduced air entry all over, no wheezes or rhonchi Heart: Irregularly irregular, no murmurs or gallops Abdomen: Soft, nondistended, bowel  sounds present Skin: No rash  Resolved Hospital Problem list   Septic shock  Assessment & Plan:  S/p PEA cardiac arrest x 2 in the setting of hypoxia due to mucous plugging Acute hypoxic respiratory failure due to aspiration pneumonia and mucous plugging Pneumococcal pneumonia upon  presentation  Continue lung protective ventilation VAP bundle in place Avoid deep sedation, RASS goal 0/-1  Intermittently requiring low-dose Precedex infusion Goals of care discussions are ongoing Patient completed antibiotic therapy for pneumonia Monitor vital signs if he spikes fever again, unfortunately he needs reinitiation of antibiotics  Acute bilateral multiple strokes Acute septic encephalopathy with background of advanced dementia Chronic atrial fibrillation, not on anticoagulation He remains high risk of recurrent stroke due to chronic A-fib He is not on anticoagulation due to high risk of falls  Continue aspirin and statin Heart rate remain well controlled   ESRD on HD MWF Anemia of Chronic Disease Nephrology is following, dialysis per schedule Monitor H&H and transfuse if less than 7   Demand cardiac ischemia CHB s/p pacemaker Acute systolic CHF Continue Coreg at 3.125 mg Continue telemetry monitoring  Hypothyroidism Continue levothyroxine   DMII Monitor fingerstick with goal 140-180 Continue sliding scale insulin    Severe protein calorie malnutrition Continue Tube feeds  Best Practice (right click and "Reselect all SmartList Selections" daily)   Diet/type: Tube feeds DVT prophylaxis: prophylactic heparin  GI prophylaxis: PPI Lines: N/A Foley:  N/A Code Status:  full code Last date of multidisciplinary goals of care discussion [10/30 had detailed discussion with patient's daughter who is DPOA, explained that unfortunately patient does not have any ability to clear his own secretions and this will be ongoing problem.  Despite everything patient's daughter would like to continue full scope of care Palliative care consult is requested   Labs   CBC: Recent Labs  Lab 01/22/22 0208 01/23/22 0100 01/24/22 0134 01/24/22 1807 01/25/22 0435 01/26/22 0456  WBC 11.3* 13.0* 13.9*  --  14.8* 12.3*  HGB 9.1* 9.4* 10.4* 9.2* 9.8* 9.4*  HCT 28.3* 29.2* 33.4*  27.0* 31.2* 29.1*  MCV 101.1* 102.1* 100.9*  --  101.6* 99.7  PLT 192 223 259  --  294 974    Basic Metabolic Panel: Recent Labs  Lab 01/22/22 0208 01/23/22 0100 01/24/22 0134 01/24/22 1807 01/25/22 0435 01/26/22 0456  NA 137 137 140 136 140 140  K 4.5 4.5 5.0 4.9 5.6* 5.1  CL 97* 96* 97*  --  99 98  CO2 _0 --  23 21*  GLUCOSE 172* 143* 114*  --  90 169*  BUN 62* 45* 73*  --  104* 138*  CREATININE 5.22* 3.69* 5.35*  --  6.97* 8.59*  CALCIUM 10.0 10.3 10.9*  --  10.7* 10.3  PHOS 4.5 3.8 4.6  --  5.7* 6.0*   GFR: Estimated Creatinine Clearance: 4.6 mL/min (A) (by C-G formula based on SCr of 8.59 mg/dL (H)). Recent Labs  Lab 01/23/22 0100 01/24/22 0134 01/25/22 0435 01/26/22 0456  WBC 13.0* 13.9* 14.8* 12.3*    Liver Function Tests: Recent Labs  Lab 01/22/22 0208 01/23/22 0100 01/24/22 0134 01/25/22 0435 01/26/22 0456  ALBUMIN 2.5* 3.0* 3.0* 2.8* 2.6*   No results for input(s): "LIPASE", "AMYLASE" in the last 168 hours. No results for input(s): "AMMONIA" in the last 168 hours.  ABG    Component Value Date/Time   PHART 7.478 (H) 01/24/2022 1807   PCO2ART 35.0 01/24/2022 1807   PO2ART 149 (H) 01/24/2022 1807  HCO3 26.0 01/24/2022 1807   TCO2 27 01/24/2022 1807   ACIDBASEDEF 5.0 (H) 07/28/2016 0850   ACIDBASEDEF 5.0 (H) 07/28/2016 0850   O2SAT 99 01/24/2022 1807     Coagulation Profile: No results for input(s): "INR", "PROTIME" in the last 168 hours.   Cardiac Enzymes: No results for input(s): "CKTOTAL", "CKMB", "CKMBINDEX", "TROPONINI" in the last 168 hours.  HbA1C: Hgb A1c MFr Bld  Date/Time Value Ref Range Status  01/24/2022 01:17 PM 5.2 4.8 - 5.6 % Final    Comment:    (NOTE) Pre diabetes:          5.7%-6.4%  Diabetes:              >6.4%  Glycemic control for   <7.0% adults with diabetes   09/19/2016 04:07 AM 5.5 4.8 - 5.6 % Final    Comment:    (NOTE)         Pre-diabetes: 5.7 - 6.4         Diabetes: >6.4         Glycemic  control for adults with diabetes: <7.0     CBG: Recent Labs  Lab 01/25/22 1618 01/25/22 1931 01/25/22 2341 01/26/22 0343 01/26/22 0754  GLUCAP 96 155* 148* 162* 169*    Total critical care time: 34 minutes  Performed by: [place] care time was exclusive of separately billable procedures and treating other patients.   Critical care was necessary to treat or prevent imminent or life-threatening deterioration.   Critical care was time spent personally by me on the following activities: development of treatment plan with patient and/or surrogate as well as nursing, discussions with consultants, evaluation of patient's response to treatment, examination of patient, obtaining history from patient or surrogate, ordering and performing treatments and interventions, ordering and review of laboratory studies, ordering and review of radiographic studies, pulse oximetry and re-evaluation of patient's condition.   [name], MD [facility] See Amion for pager If no response to pager, please call [phone] until 7pm After 7pm, Please call E-link [phone]

## 2022-01-26 NOTE — Progress Notes (Signed)
Nephrology Progress Note:  Subjective:   Pt seen in ICU. No changes overnight. Not on any pressor support, on precedex gtt.  -------------------------- Notable recent events:  suffered PEA cardiac arrest on 10/21 and was intubated.  CT on 10/22 noted small CVA.  MRI with multiple infarcts c/w shower of microemboli. He had a bronchoscopy on 10/25 with thick mucoid secretions bilaterally.     Objective Vital signs in last 24 hours: Vitals:   01/26/22 1000 01/26/22 1100 01/26/22 1120 01/26/22 1200  BP: 106/63 (!) 92/53  (!) 87/56  Pulse:    69  Resp: 20 20  (!) 23  Temp:   98.3 F (36.8 C)   TempSrc:   Axillary   SpO2:    100%  Weight:      Height:       Weight change: -7 kg  Intake/Output Summary (Last 24 hours) at 01/26/2022 1329 Last data filed at 01/26/2022 1300 Gross per 24 hour  Intake 1488.78 ml  Output 63 ml  Net 1425.78 ml    Physical Exam:      Gen on vent, sedated Sclera anicteric, throat w/ ETT No jvd or bruits Chest clear anterior/ lateral RRR no MRG Abd soft ntnd no mass or ascites +bs Ext no UE/ LE edema Neuro is on vent, sedated  LUE AVF bruit and thrill    Dialysis Orders:  MWF [facility]  4h    400/1.5   54.5kg  2/2 bath   LUE AVF  Heparin 3000 Calcitriol 0.5 mcg PO qHD Sensipar '120mg'$  qHD     Assessment/Plan: AMS - likely 2/2 bacteremia and PNA. S/p EEG with no seizure activity. Also hx of recurrent PEA arrest, and hx of dementia.   Acute respiratory failure - recurrent issue. Was intubated initially. Concern for aspiration on CT. Able to be extubated 10/18 however required reintubation for cardiac arrest on 10/21.  Extubated 10/26.  S/p bronch with thick secretions.  DC'd to floor 10/29 then had another PEA arrest 10/30 and is back in ICU on vent again.  Shock -  appears better, off pressors today Aspiration PNA - noted on CT. Abx per primary team.  Optimize volume status.   Bacteremia - BC +streptococcus pneumoniae. SP course of abx ESRD -  On HD per  MWF schedule.  HD postponed to today, will get HD today.  BP/ volume- no vol excess on exam, looks a bit dry maybe. 1kg under dry wt  Anemia of CKD - off of ESA now in setting of acute stroke.  No iron in setting of acute infection. (Note he did get aranesp 10/20 prior to that) Secondary Hyperparathyroidism - paused calcitriol with hypercalcemia.  No binders now. Limited to baths available inpatient  Acute CVA - MRI with multiple infarcts c/w shower of microemboli.  Per primary team and neuro  CAD, CHB s/p PPM DMT2 - per primary team  COPD - noted decreased resp reserve  Kelly Splinter, MD 01/26/2022, 1:32 PM  Recent Labs  Lab 01/25/22 0435 01/26/22 0456  HGB 9.8* 9.4*  ALBUMIN 2.8* 2.6*  CALCIUM 10.7* 10.3  PHOS 5.7* 6.0*  CREATININE 6.97* 8.59*  K 5.6* 5.1    Inpatient medications:   stroke: early stages of recovery book   Does not apply Once   aspirin  81 mg Per Tube Daily   atorvastatin  20 mg Per Tube Daily   brinzolamide  1 drop Both Eyes BID   And   brimonidine  1 drop Both Eyes BID  carvedilol  3.125 mg Per Tube BID WC   Chlorhexidine Gluconate Cloth  6 each Topical Q0600   Chlorhexidine Gluconate Cloth  6 each Topical Q0600   docusate  100 mg Per Tube BID   donepezil  10 mg Per Tube QHS   famotidine  20 mg Per Tube Daily   feeding supplement (PROSource TF20)  60 mL Per Tube Daily   fiber supplement (BANATROL TF)  60 mL Per Tube BID   heparin injection (subcutaneous)  5,000 Units Subcutaneous Q12H   insulin aspart  0-15 Units Subcutaneous Q4H   insulin aspart  2 Units Subcutaneous Q4H   levothyroxine  150 mcg Per Tube Q0600   memantine  10 mg Per Tube BID   mouth rinse  15 mL Mouth Rinse Q2H   polyethylene glycol  17 g Per Tube Daily    sodium chloride Stopped (01/23/22 1921)   dexmedetomidine (PRECEDEX) IV infusion Stopped (01/26/22 1115)   feeding supplement (PEPTAMEN 1.5 CAL) 50 mL/hr at 01/26/22 1300   sodium chloride, acetaminophen (TYLENOL) oral liquid  160 mg/5 mL, albuterol, fentaNYL (SUBLIMAZE) injection, fentaNYL (SUBLIMAZE) injection, mouth rinse

## 2022-01-26 NOTE — Progress Notes (Signed)
Received patient in bed ,unable to assess cognition due that patient is intubated and connected to ventilator machine.  Access used : Lt. Upper arm fistula ,works well during treatment.  HD issue. Patient unable to sustained blood pressure throughout the treatment,300 cc NS bolus given x 2 during treatment for SBP less than 90, as per MD ordered. HD nurse tried to make it even ,by trying to remove the 600 cc NS Bolus given during the last hour of his treatment ,but sbp was dropping down to low 90's,patient retained 300 cc out of 600 cc given.  Net UF:+300 cc Report given to patient's nurse at the bedside.

## 2022-01-27 DIAGNOSIS — E43 Unspecified severe protein-calorie malnutrition: Secondary | ICD-10-CM | POA: Diagnosis not present

## 2022-01-27 DIAGNOSIS — I5021 Acute systolic (congestive) heart failure: Secondary | ICD-10-CM | POA: Diagnosis not present

## 2022-01-27 DIAGNOSIS — G9340 Encephalopathy, unspecified: Secondary | ICD-10-CM | POA: Diagnosis not present

## 2022-01-27 DIAGNOSIS — J9601 Acute respiratory failure with hypoxia: Secondary | ICD-10-CM | POA: Diagnosis not present

## 2022-01-27 LAB — CBC
HCT: 31.2 % — ABNORMAL LOW (ref 39.0–52.0)
Hemoglobin: 10 g/dL — ABNORMAL LOW (ref 13.0–17.0)
MCH: 32.5 pg (ref 26.0–34.0)
MCHC: 32.1 g/dL (ref 30.0–36.0)
MCV: 101.3 fL — ABNORMAL HIGH (ref 80.0–100.0)
Platelets: 214 10*3/uL (ref 150–400)
RBC: 3.08 MIL/uL — ABNORMAL LOW (ref 4.22–5.81)
RDW: 15.6 % — ABNORMAL HIGH (ref 11.5–15.5)
WBC: 11 10*3/uL — ABNORMAL HIGH (ref 4.0–10.5)
nRBC: 0 % (ref 0.0–0.2)

## 2022-01-27 LAB — RENAL FUNCTION PANEL
Albumin: 2.3 g/dL — ABNORMAL LOW (ref 3.5–5.0)
Anion gap: 15 (ref 5–15)
BUN: 73 mg/dL — ABNORMAL HIGH (ref 8–23)
CO2: 23 mmol/L (ref 22–32)
Calcium: 9.2 mg/dL (ref 8.9–10.3)
Chloride: 98 mmol/L (ref 98–111)
Creatinine, Ser: 4.79 mg/dL — ABNORMAL HIGH (ref 0.61–1.24)
GFR, Estimated: 11 mL/min — ABNORMAL LOW (ref >60)
Glucose, Bld: 85 mg/dL (ref 70–99)
Phosphorus: 3.9 mg/dL (ref 2.5–4.6)
Potassium: 4.7 mmol/L (ref 3.5–5.1)
Sodium: 136 mmol/L (ref 135–145)

## 2022-01-27 LAB — GLUCOSE, CAPILLARY
Glucose-Capillary: 121 mg/dL — ABNORMAL HIGH (ref 70–99)
Glucose-Capillary: 130 mg/dL — ABNORMAL HIGH (ref 70–99)
Glucose-Capillary: 133 mg/dL — ABNORMAL HIGH (ref 70–99)
Glucose-Capillary: 153 mg/dL — ABNORMAL HIGH (ref 70–99)
Glucose-Capillary: 156 mg/dL — ABNORMAL HIGH (ref 70–99)
Glucose-Capillary: 183 mg/dL — ABNORMAL HIGH (ref 70–99)

## 2022-01-27 MED ORDER — CHLORHEXIDINE GLUCONATE CLOTH 2 % EX PADS
6.0000 | MEDICATED_PAD | Freq: Every day | CUTANEOUS | Status: DC
  Administered 2022-01-28 – 2022-01-30 (×2): 6 via TOPICAL

## 2022-01-27 NOTE — Progress Notes (Signed)
[facility] Progress Note Patient Name: DELMAN GOSHORN DOB: 1932/04/02 MRN: [id number]   Date of Service  01/27/2022  HPI/Events of Note  Patient having liquid stool for several days. Nursing states stool has strong odor and requests order for C. Difficile testing. Unfortunately, order for C. Difficile testing must be approved by an ID physician.   eICU Interventions  Will defer C. Difficile testing order to PCCM day rounding team who can consult with ID and obtain needed approval if testing is indicated.      Intervention Category Major Interventions: Other:  Betzabe Bevans Cornelia Copa 01/27/2022, 1:28 AM

## 2022-01-27 NOTE — TOC Initial Note (Addendum)
Transition of Care [facility]) - Initial/Assessment Note    Patient Details  Name: Jeffrey Campbell MRN: [id number] Date of Birth: 26-Aug-1932  Transition of [facility]) CM/SW Contact:    Milas Gain, [place] Phone Number: 01/27/2022, 2:56 PM  Clinical Narrative:                   Due to patients current orientation,CSW spoke with patients daughter Jeffrey Campbell. CSW introduced self, role. Patients daughter reports patient comes from home with her. CSW informed patients daughter TOC will continue to follow patient and will assist as needed.All questions answered.CSW informed patients daughter will follow up regarding PT/OT recommendations/dc plans for patient closer to patient being medically ready for dc. . CSW will follow up with patients daughter regarding current PT/OT recommendations once PT/OT signed back on and able to work with patient again. No further questions reported at this time.     Patient Goals and CMS Choice        Expected Discharge Plan and Services                                                Prior Living Arrangements/Services                       Activities of Daily Living      Permission Sought/Granted                  Emotional Assessment              Admission diagnosis:  Encephalopathy [G93.40] Sepsis, due to unspecified organism, unspecified whether acute organ dysfunction present [facility]) [A41.9] Patient Active Problem List   Diagnosis Date Noted   Cardiac arrest, cause unspecified ([place]) 01/16/2022   Shock ([place]) 78/24/2353   Acute systolic heart failure ([place])    Protein-calorie malnutrition, severe 01/13/2022   Sepsis ([place])    Acute respiratory failure with hypoxia (HCC)    Non-ST elevation (NSTEMI) myocardial infarction ([place])    Persistent atrial fibrillation ([place])    Encephalopathy 01/20/2022   COVID-19    Acute cystitis with hematuria    Encephalopathy acute 11/16/2021   Unspecified protein-calorie malnutrition  ([place]) 08/21/2019   Iron deficiency anemia, unspecified 08/10/2019   Pain, unspecified 08/08/2019   Delirium    Palliative care encounter    Hypertension    ESRD (end stage renal disease) ([place])    Elevated troponin    Other specified coagulation defects ([place]) 07/20/2019   Allergy, unspecified, initial encounter 07/18/2019   Shortness of breath 07/18/2019   Benign prostatic hyperplasia without lower urinary tract symptoms 07/17/2019   Gastro-esophageal reflux disease without esophagitis 07/17/2019   Other specified postprocedural states 07/17/2019   Secondary hyperparathyroidism of renal origin ([place]) 07/17/2019   Thyrotoxicosis with diffuse goiter without thyrotoxic crisis or storm 07/17/2019   Unilateral inguinal hernia, without obstruction or gangrene, not specified as recurrent 07/17/2019   Complete heart block ([place]) 02/08/2019   Atrial flutter ([place])    Dementia without behavioral disturbance ([place]) 09/18/2018   Goals of care, counseling/discussion    Palliative care by specialist    DNR (do not resuscitate) discussion    Hypothermia 09/01/2018   GI bleeding 10/15/2016   GI bleed 10/15/2016   Anemia of chronic disease    CKD (chronic kidney disease), stage V ([place])    Hypertensive  urgency    Vascular dementia with behavioral disturbance ([place]) 65/46/5035   Acute metabolic encephalopathy    Branch retinal vein occlusion of right eye 05/07/2016   Graves' orbitopathy 05/07/2016   Primary open angle glaucoma of left eye, mild stage 05/07/2016   Primary open angle glaucoma of right eye, severe stage 05/07/2016   PAD (peripheral artery disease) ([place]) 04/20/2016   Bilateral renal artery stenosis (HCC) 02/18/2015   Pulmonary hypertension ([place]) 02/10/2015   Chronic diastolic CHF (congestive heart failure) ([place]) 02/10/2015   Renal bruit 01/07/2015   Carotid artery stenosis    Diastolic dysfunction    Bradycardia 05/29/2013   Glaucoma 04/09/2011   COPD (chronic obstructive pulmonary  disease) ([place]) 09/21/2010   Airway obstruction    Upper airway resistance syndrome    Hypothyroidism    Type 2 diabetes, diet controlled ([place])    CAD (coronary artery disease)    Hyperlipemia    Hypertensive heart and chronic kidney disease with heart failure and stage 1 through stage 4 chronic kidney disease, or chronic kidney disease ([place])    PUD (peptic ulcer disease)    PCP:  Lawerance Cruel, MD Pharmacy:   [id number] [place] [place] [place]Phone: [phone]: [phone] [facility] [address] [facility] [address] [id number]: [phone]: [phone]Phone: [phone]: [phone]    Social Determinants of Health (SDOH) Interventions    Readmission Risk Interventions     No data to display

## 2022-01-27 NOTE — Progress Notes (Signed)
NAME:  Jeffrey Campbell, MRN:  [id number], DOB:  01-03-1933, LOS: 64 ADMISSION DATE:  12/29/2021, CONSULTATION DATE:  12/27/2021 REFERRING MD:  EDP, CHIEF COMPLAINT:  AMS. ARF   History of Present Illness:  88 year old male with extensive PMH including ESRD on HD MWF. Presents to ED on 10/17 after being found unresponsive at home. Per family patient recent admission 8/23 for altered mental status, UTI. Now with reported tiredness, weakness over last 24 hours. This morning when daughter arrived at patient home, she reports patient was unresponsive making gurgling sounds, on arrival to ED patient remains minimally responsive with tachypnea and hypotension requiring intubation. CXR with left lower lobe infiltrate. CT head negative. CT C/A/P with extensive bilateral infiltirates and noted left lower lobe infiltrate concerning for aspiration. Given Cefepime/Vancomycin. Critical Care Consulted for admission.  10/21 PCCM reconsulted for complete whiteout of left lung, transferred to ICU and intubated, brief PEA arrest with ROSC in  4 minutes I/O placed  Patient was extubated on 10/26, he remained generalized weak, unable to clear his secretions, he was watched in ICU for 48 hours, was transferred to progressive care unit on 10/28.  On 10/29 patient was found lethargic, minimally responsive, x-ray chest was done which showed whiteout left lung, critical care was consulted and in between patient went into respiratory arrest and coded, ROSC was achieved after 5 minutes, he was intubated and was transferred back to ICU for further care  Pertinent  Medical History  HTN, DMII, CAD, CHB h/o PPM, ESRD on HD MWF, COPD,   Significant Hospital Events: Including procedures, antibiotic start and stop dates in addition to other pertinent events   ETT 10/17-10/18 10/20 transferred out of ICU 10/21 PCCM reconsulted for complete whiteout of left lung, transferred to ICU and intubated, brief PEA arrest with ROSC in  4 minutes  I/O placed 10/22 CT Head: Small acute infarct in the inferior left cerebellum 10/24 MRI brain: Acute infarct of the left and right cerebellum, right pons as well as multiple scattered infarcts in both cerebral hemisphere 10/24 MRA head: Severe stenosis of L ICA, moderate stenosis of R ICA and moderate stenosis of distal right vertebral artery 10/25 CXR worsening lower lung atelectasis/infiltrate 10/25 bronchoscopy w/ BAL 10/26 extubated to nasal cannula 10/29 became hypoxic and went to PEA cardiac arrest, reintubated and transferred to ICU  Interim History / Subjective:  No overnight issues Precedex was stopped, patient is awake intermittently following few commands  Objective   Blood pressure (!) 98/51, pulse 70, temperature (!) 97.4 F (36.3 C), temperature source Oral, resp. rate 20, height _0  (1.753 m), weight 55 kg, SpO2 99 %.    Vent Mode: PRVC FiO2 (%):  [40 %] 40 % Set Rate:  [20 bmp] 20 bmp Vt Set:  [560 mL] 560 mL PEEP:  [5 cmH20] 5 cmH20 Pressure Support:  [8 cmH20] 8 cmH20 Plateau Pressure:  [17 cmH20-20 cmH20] 17 cmH20   Intake/Output Summary (Last 24 hours) at 01/27/2022 0834 Last data filed at 01/27/2022 0100 Gross per 24 hour  Intake 878.31 ml  Output 640 ml  Net 238.31 ml   Filed Weights   01/26/22 1420 01/26/22 1844 01/27/22 0500  Weight: 59.7 kg 60 kg 55 kg   Examination: Physical exam: General: Crtitically ill-appearing male, orally intubated HEENT: [place]/AT, eyes anicteric.  ETT and OGT in place Neuro: Eyes closed, does not open, opens with painful stimuli, not following commands, withdrawing in all 4 extremities Chest: Reduced air entry all over,  no wheezes or rhonchi Heart: Irregularly irregular, no murmurs or gallops Abdomen: Soft, nondistended, bowel sounds present Skin: No rash  Resolved Hospital Problem list   Septic shock  Assessment & Plan:  S/p PEA cardiac arrest x 2 in the setting of hypoxia due to mucous plugging Acute hypoxic  respiratory failure due to aspiration pneumonia and mucous plugging Pneumococcal pneumonia upon presentation  Continue lung protective ventilation VAP bundle in place Avoid sedation Placed on pressure support trial, will try to extubate him, discussion will be if he struggles then what will be the plan Goals of care discussions are ongoing with patient's daughter Patient completed antibiotic therapy for pneumonia Monitor vital signs if he spikes fever again, unfortunately he needs reinitiation of antibiotics  Acute bilateral multiple strokes Acute septic encephalopathy with background of advanced dementia Chronic atrial fibrillation, not on anticoagulation He remains high risk of recurrent stroke due to chronic A-fib He is not on anticoagulation due to high risk of falls  Continue aspirin and statin Heart rate remain well controlled   ESRD on HD MWF Anemia of Chronic Disease Nephrology is following, dialysis per schedule Monitor H&H and transfuse if less than 7   Demand cardiac ischemia CHB s/p pacemaker Acute systolic CHF Continue Coreg at 3.125 mg Continue telemetry monitoring  Hypothyroidism Continue levothyroxine   DMII Monitor fingerstick with goal 140-180 Continue sliding scale insulin    Severe protein calorie malnutrition Continue Tube feeds  Best Practice (right click and "Reselect all SmartList Selections" daily)   Diet/type: Tube feeds DVT prophylaxis: prophylactic heparin  GI prophylaxis: PPI Lines: N/A Foley:  N/A Code Status:  full code Last date of multidisciplinary goals of care discussion [10/30 had detailed discussion with patient's daughter who is DPOA, explained that unfortunately patient does not have any ability to clear his own secretions and this will be ongoing problem.  Despite everything patient's daughter would like to continue full scope of care Palliative care consult is requested   Labs   CBC: Recent Labs  Lab 01/23/22 0100  01/24/22 0134 01/24/22 1807 01/25/22 0435 01/26/22 0456 01/27/22 0342  WBC 13.0* 13.9*  --  14.8* 12.3* 11.0*  HGB 9.4* 10.4* 9.2* 9.8* 9.4* 10.0*  HCT 29.2* 33.4* 27.0* 31.2* 29.1* 31.2*  MCV 102.1* 100.9*  --  101.6* 99.7 101.3*  PLT 223 259  --  294 296 272    Basic Metabolic Panel: Recent Labs  Lab 01/23/22 0100 01/24/22 0134 01/24/22 1807 01/25/22 0435 01/26/22 0456 01/27/22 0342  NA 137 140 136 140 140 136  K 4.5 5.0 4.9 5.6* 5.1 4.7  CL 96* 97*  --  99 98 98  CO2 26 26  --  23 21* 23  GLUCOSE 143* 114*  --  90 169* 85  BUN 45* 73*  --  104* 138* 73*  CREATININE 3.69* 5.35*  --  6.97* 8.59* 4.79*  CALCIUM 10.3 10.9*  --  10.7* 10.3 9.2  PHOS 3.8 4.6  --  5.7* 6.0* 3.9   GFR: Estimated Creatinine Clearance: 8.6 mL/min (A) (by C-G formula based on SCr of 4.79 mg/dL (H)). Recent Labs  Lab 01/24/22 0134 01/25/22 0435 01/26/22 0456 01/27/22 0342  WBC 13.9* 14.8* 12.3* 11.0*    Liver Function Tests: Recent Labs  Lab 01/23/22 0100 01/24/22 0134 01/25/22 0435 01/26/22 0456 01/27/22 0342  ALBUMIN 3.0* 3.0* 2.8* 2.6* 2.3*   No results for input(s): "LIPASE", "AMYLASE" in the last 168 hours. No results for input(s): "AMMONIA" in the last 168  hours.  ABG    Component Value Date/Time   PHART 7.478 (H) 01/24/2022 1807   PCO2ART 35.0 01/24/2022 1807   PO2ART 149 (H) 01/24/2022 1807   HCO3 26.0 01/24/2022 1807   TCO2 27 01/24/2022 1807   ACIDBASEDEF 5.0 (H) 07/28/2016 0850   ACIDBASEDEF 5.0 (H) 07/28/2016 0850   O2SAT 99 01/24/2022 1807     Coagulation Profile: No results for input(s): "INR", "PROTIME" in the last 168 hours.   Cardiac Enzymes: No results for input(s): "CKTOTAL", "CKMB", "CKMBINDEX", "TROPONINI" in the last 168 hours.  HbA1C: Hgb A1c MFr Bld  Date/Time Value Ref Range Status  12/27/2021 01:17 PM 5.2 4.8 - 5.6 % Final    Comment:    (NOTE) Pre diabetes:          5.7%-6.4%  Diabetes:              >6.4%  Glycemic control for    <7.0% adults with diabetes   09/19/2016 04:07 AM 5.5 4.8 - 5.6 % Final    Comment:    (NOTE)         Pre-diabetes: 5.7 - 6.4         Diabetes: >6.4         Glycemic control for adults with diabetes: <7.0     CBG: Recent Labs  Lab 01/26/22 1652 01/26/22 2127 01/27/22 0006 01/27/22 0541 01/27/22 0743  GLUCAP 146* 171* 130* 153* 121*    Total critical care time: 32 minutes  Performed by: [place] care time was exclusive of separately billable procedures and treating other patients.   Critical care was necessary to treat or prevent imminent or life-threatening deterioration.   Critical care was time spent personally by me on the following activities: development of treatment plan with patient and/or surrogate as well as nursing, discussions with consultants, evaluation of patient's response to treatment, examination of patient, obtaining history from patient or surrogate, ordering and performing treatments and interventions, ordering and review of laboratory studies, ordering and review of radiographic studies, pulse oximetry and re-evaluation of patient's condition.   [name], MD [facility] See Amion for pager If no response to pager, please call [phone] until 7pm After 7pm, Please call E-link [phone]

## 2022-01-27 NOTE — IPAL (Signed)
Interdisciplinary Goals of Care Family Meeting   Date carried out:: 01/27/2022  Location of the meeting: Bedside  Member's involved: Physician, Bedside Registered Nurse, and Family Member or next of kin  Durable Power of Attorney or acting medical decision maker: Jeffrey Campbell    Discussion: We discussed goals of care for Jeffrey Campbell .    The Clinical status was relayed to daughter at bedside in detail.   Updated and notified of patients medical condition.   Patient is having a weak cough and struggling to remove secretions.   Patient with increased WOB and using accessory muscles to breathe Explained to family course of therapy and the modalities  He has advanced dementia and end-stage renal disease   Patient with Progressive multiorgan failure with a very high probablity of a very minimal chance of meaningful recovery despite all aggressive and optimal medical therapy.  Code status: Full DNR  Disposition: Continue current acute care    Family are satisfied with Plan of action and management. All questions answered   [name] MD [facility] See Amion for pager If no response to pager, please call [phone] until 7pm After 7pm, Please call E-link [phone]

## 2022-01-27 NOTE — Procedures (Signed)
Extubation Procedure Note  Patient Details:   Name: DAMARIEN NYMAN DOB: [birth year] MRN: [id number]   Airway Documentation:    Vent end date: 01/27/22 Vent end time: 1235   Evaluation  O2 sats: stable throughout Complications: No apparent complications Patient did tolerate procedure well. Bilateral Breath Sounds: Diminished, Rhonchi   No  Patient was extubated to a 2L [place] with daughter and RN in the room. Positive cuff leak prior to extubation.   Claretta Fraise 01/27/2022, 12:35 PM

## 2022-01-27 NOTE — Progress Notes (Signed)
[facility] Physician-Brief Progress Note Patient Name: Jeffrey Campbell DOB: 03/25/1933 MRN: [id number]   Date of Service  01/27/2022  HPI/Events of Note  Family has reportedly agreed to giving morphine   eICU Interventions  I am unable to reach family to confirm this. Patient does not appear to be in distress at this time. Bedside RN and eLink RN made aware Elink MD available to discuss once family calls back     Intervention Category Minor Interventions: Communication with other healthcare providers and/or family  Judd Lien 01/27/2022, 11:05 PM

## 2022-01-27 NOTE — Progress Notes (Signed)
Nephrology Progress Note:  Subjective:   Pt seen in ICU. No changes overnight. BP's stable, no pressors. More responsive today  Objective Vital signs in last 24 hours: Vitals:   01/27/22 1100 01/27/22 1200 01/27/22 1206 01/27/22 1235  BP: (!) 112/55 117/60  (!) 119/55  Pulse: 70 69  70  Resp: (!) 22 (!) 22  (!) 22  Temp:   (!) 97.5 F (36.4 C)   TempSrc:   Oral   SpO2: 100% 100%    Weight:      Height:       Weight change: 6.7 kg  Intake/Output Summary (Last 24 hours) at 01/27/2022 1436 Last data filed at 01/27/2022 1200 Gross per 24 hour  Intake 1220 ml  Output 875 ml  Net 345 ml    Physical Exam:      Gen on vent, awake and following very simple commands Sclera anicteric, throat w/ ETT No jvd or bruits Chest clear anterior/ lateral RRR no MRG Abd soft ntnd no mass or ascites +bs Ext no UE/ LE edema Neuro is on vent, sedated  LUE AVF bruit and thrill    Dialysis Orders:  MWF [facility]  4h    400/1.5   54.5kg  2/2 bath   LUE AVF  Heparin 3000 Calcitriol 0.5 mcg PO qHD Sensipar '120mg'$  qHD     Assessment/Plan: AMS - likely 2/2 bacteremia and PNA. S/p EEG with no seizure activity. Also hx of recurrent PEA arrest, and hx of dementia.   Acute respiratory failure - recurrent issue. Was intubated initially. Concern for aspiration on CT. Able to be extubated 10/18 however required reintubation for cardiac arrest on 10/21.  Extubated 10/26.  S/p bronch with thick secretions.  DC'd to floor 10/29 then had another PEA arrest 10/30 and is back in ICU on vent again.  Shock -  resolved Aspiration PNA - noted on CT. Abx per primary team.   Bacteremia - BC +streptococcus pneumoniae. SP course of abx ESRD -  On HD per MWF schedule. Is off schedule this week, cont TTS for now.  BP/ volume- euvolemic on exam, 1kg under dry wt  Anemia of CKD - off of ESA now in setting of acute stroke.  No iron in setting of acute infection. (Note he did get aranesp 10/20 prior to that) Secondary  Hyperparathyroidism - paused calcitriol with hypercalcemia.  No binders now. Limited to baths available inpatient  Acute CVA - MRI with multiple infarcts c/w shower of microemboli.  Per primary team and neuro  CAD, CHB s/p PPM DMT2 - per primary team  COPD   Kelly Splinter, MD 01/27/2022, 2:36 PM  Recent Labs  Lab 01/26/22 0456 01/27/22 0342  HGB 9.4* 10.0*  ALBUMIN 2.6* 2.3*  CALCIUM 10.3 9.2  PHOS 6.0* 3.9  CREATININE 8.59* 4.79*  K 5.1 4.7     Inpatient medications:   stroke: early stages of recovery book   Does not apply Once   aspirin  81 mg Per Tube Daily   atorvastatin  20 mg Per Tube Daily   brinzolamide  1 drop Both Eyes BID   And   brimonidine  1 drop Both Eyes BID   carvedilol  3.125 mg Per Tube BID WC   Chlorhexidine Gluconate Cloth  6 each Topical Q0600   Chlorhexidine Gluconate Cloth  6 each Topical Q0600   Chlorhexidine Gluconate Cloth  6 each Topical Q0600   docusate  100 mg Per Tube BID   donepezil  10 mg Per  Tube QHS   famotidine  20 mg Per Tube Daily   feeding supplement (PROSource TF20)  60 mL Per Tube Daily   fiber supplement (BANATROL TF)  60 mL Per Tube BID   heparin injection (subcutaneous)  5,000 Units Subcutaneous Q12H   insulin aspart  0-15 Units Subcutaneous Q4H   insulin aspart  2 Units Subcutaneous Q4H   levothyroxine  150 mcg Per Tube Q0600   memantine  10 mg Per Tube BID   mouth rinse  15 mL Mouth Rinse Q2H   polyethylene glycol  17 g Per Tube Daily    sodium chloride Stopped (01/23/22 1921)   feeding supplement (PEPTAMEN 1.5 CAL) 50 mL/hr at 01/27/22 1200   sodium chloride     sodium chloride     sodium chloride, acetaminophen (TYLENOL) oral liquid 160 mg/5 mL, albuterol, fentaNYL (SUBLIMAZE) injection, fentaNYL (SUBLIMAZE) injection, mouth rinse, sodium chloride

## 2022-01-27 NOTE — Plan of Care (Signed)
Problem: Education: Goal: Ability to describe self-care measures that may prevent or decrease complications (Diabetes Survival Skills Education) will improve 01/27/2022 0608 by Mena Goes, RN Outcome: Progressing 01/27/2022 0608 by Mena Goes, RN Outcome: Not Progressing   Problem: Fluid Volume: Goal: Ability to maintain a balanced intake and output will improve 01/27/2022 0608 by Mena Goes, RN Outcome: Progressing 01/27/2022 0608 by Mena Goes, RN Outcome: Not Progressing   Problem: Metabolic: Goal: Ability to maintain appropriate glucose levels will improve 01/27/2022 0608 by Mena Goes, RN Outcome: Progressing 01/27/2022 0608 by Mena Goes, RN Outcome: Not Progressing   Problem: Nutritional: Goal: Maintenance of adequate nutrition will improve 01/27/2022 0608 by Mena Goes, RN Outcome: Progressing 01/27/2022 0608 by Mena Goes, RN Outcome: Not Progressing   Problem: Tissue Perfusion: Goal: Adequacy of tissue perfusion will improve 01/27/2022 0608 by Mena Goes, RN Outcome: Progressing 01/27/2022 0608 by Mena Goes, RN Outcome: Not Progressing   Problem: Education: Goal: Knowledge of General Education information will improve Description: Including pain rating scale, medication(s)/side effects and non-pharmacologic comfort measures 01/27/2022 0608 by Mena Goes, RN Outcome: Progressing 01/27/2022 0608 by Mena Goes, RN Outcome: Not Progressing   Problem: Health Behavior/Discharge Planning: Goal: Ability to manage health-related needs will improve 01/27/2022 0608 by Mena Goes, RN Outcome: Progressing 01/27/2022 0608 by Mena Goes, RN Outcome: Not Progressing   Problem: Clinical Measurements: Goal: Ability to maintain clinical measurements within normal limits will improve 01/27/2022 0608 by Mena Goes, RN Outcome: Progressing 01/27/2022 0608 by Mena Goes, RN Outcome: Not Progressing Goal:  Will remain free from infection 01/27/2022 0608 by Mena Goes, RN Outcome: Progressing 01/27/2022 0608 by Mena Goes, RN Outcome: Not Progressing Goal: Diagnostic test results will improve 01/27/2022 0608 by Mena Goes, RN Outcome: Progressing 01/27/2022 0608 by Mena Goes, RN Outcome: Not Progressing Goal: Respiratory complications will improve 01/27/2022 0608 by Mena Goes, RN Outcome: Progressing 01/27/2022 0608 by Mena Goes, RN Outcome: Not Progressing Goal: Cardiovascular complication will be avoided 01/27/2022 1191 by Mena Goes, RN Outcome: Progressing 01/27/2022 0608 by Mena Goes, RN Outcome: Not Progressing   Problem: Activity: Goal: Risk for activity intolerance will decrease 01/27/2022 0608 by Mena Goes, RN Outcome: Progressing 01/27/2022 0608 by Mena Goes, RN Outcome: Not Progressing   Problem: Nutrition: Goal: Adequate nutrition will be maintained 01/27/2022 0608 by Mena Goes, RN Outcome: Progressing 01/27/2022 0608 by Mena Goes, RN Outcome: Not Progressing   Problem: Coping: Goal: Level of anxiety will decrease 01/27/2022 0608 by Mena Goes, RN Outcome: Progressing 01/27/2022 0608 by Mena Goes, RN Outcome: Not Progressing   Problem: Elimination: Goal: Will not experience complications related to bowel motility 01/27/2022 0608 by Mena Goes, RN Outcome: Progressing 01/27/2022 0608 by Mena Goes, RN Outcome: Not Progressing Goal: Will not experience complications related to urinary retention 01/27/2022 0608 by Mena Goes, RN Outcome: Progressing 01/27/2022 0608 by Mena Goes, RN Outcome: Not Progressing   Problem: Pain Managment: Goal: General experience of comfort will improve 01/27/2022 0608 by Mena Goes, RN Outcome: Progressing 01/27/2022 0608 by Mena Goes, RN Outcome: Not Progressing   Problem: Safety: Goal: Ability to remain free from injury will  improve 01/27/2022 0608 by Mena Goes, RN Outcome: Progressing 01/27/2022 0608 by Mena Goes, RN Outcome: Not Progressing   Problem: Skin Integrity: Goal: Risk for impaired skin integrity  will decrease 01/27/2022 0608 by Mena Goes, RN Outcome: Progressing 01/27/2022 0608 by Mena Goes, RN Outcome: Not Progressing   Problem: Education: Goal: Knowledge of disease or condition will improve 01/27/2022 0608 by Mena Goes, RN Outcome: Progressing 01/27/2022 0608 by Mena Goes, RN Outcome: Not Progressing Goal: Knowledge of secondary prevention will improve (MUST DOCUMENT ALL) 01/27/2022 0608 by Mena Goes, RN Outcome: Progressing 01/27/2022 0608 by Mena Goes, RN Outcome: Not Progressing Goal: Knowledge of patient specific risk factors will improve Elta [place] N/A or DELETE if not current risk factor) 01/27/2022 0608 by Mena Goes, RN Outcome: Progressing 01/27/2022 0608 by Mena Goes, RN Outcome: Not Progressing   Problem: Ischemic Stroke/TIA Tissue Perfusion: Goal: Complications of ischemic stroke/TIA will be minimized 01/27/2022 0608 by Mena Goes, RN Outcome: Progressing 01/27/2022 0608 by Mena Goes, RN Outcome: Not Progressing   Problem: Coping: Goal: Will verbalize positive feelings about self 01/27/2022 0608 by Mena Goes, RN Outcome: Progressing 01/27/2022 0608 by Mena Goes, RN Outcome: Not Progressing Goal: Will identify appropriate support needs 01/27/2022 0608 by Mena Goes, RN Outcome: Progressing 01/27/2022 0608 by Mena Goes, RN Outcome: Not Progressing   Problem: Health Behavior/Discharge Planning: Goal: Ability to manage health-related needs will improve 01/27/2022 0608 by Mena Goes, RN Outcome: Progressing 01/27/2022 0608 by Mena Goes, RN Outcome: Not Progressing Goal: Goals will be collaboratively established with patient/family 01/27/2022 0608 by Mena Goes, RN Outcome:  Progressing 01/27/2022 0608 by Mena Goes, RN Outcome: Not Progressing   Problem: Self-Care: Goal: Ability to participate in self-care as condition permits will improve 01/27/2022 0608 by Mena Goes, RN Outcome: Progressing 01/27/2022 0608 by Mena Goes, RN Outcome: Not Progressing Goal: Verbalization of feelings and concerns over difficulty with self-care will improve 01/27/2022 0608 by Mena Goes, RN Outcome: Progressing 01/27/2022 0608 by Mena Goes, RN Outcome: Not Progressing Goal: Ability to communicate needs accurately will improve 01/27/2022 0608 by Mena Goes, RN Outcome: Progressing 01/27/2022 0608 by Mena Goes, RN Outcome: Not Progressing   Problem: Nutrition: Goal: Risk of aspiration will decrease 01/27/2022 0608 by Mena Goes, RN Outcome: Progressing 01/27/2022 0608 by Mena Goes, RN Outcome: Not Progressing Goal: Dietary intake will improve 01/27/2022 0608 by Mena Goes, RN Outcome: Progressing 01/27/2022 0608 by Mena Goes, RN Outcome: Not Progressing

## 2022-01-27 DEATH — deceased

## 2022-01-28 DIAGNOSIS — G9340 Encephalopathy, unspecified: Secondary | ICD-10-CM | POA: Diagnosis not present

## 2022-01-28 DIAGNOSIS — I5021 Acute systolic (congestive) heart failure: Secondary | ICD-10-CM | POA: Diagnosis not present

## 2022-01-28 DIAGNOSIS — J9601 Acute respiratory failure with hypoxia: Secondary | ICD-10-CM | POA: Diagnosis not present

## 2022-01-28 DIAGNOSIS — I469 Cardiac arrest, cause unspecified: Secondary | ICD-10-CM | POA: Diagnosis not present

## 2022-01-28 LAB — RENAL FUNCTION PANEL
Albumin: 2.6 g/dL — ABNORMAL LOW (ref 3.5–5.0)
Anion gap: 16 — ABNORMAL HIGH (ref 5–15)
BUN: 103 mg/dL — ABNORMAL HIGH (ref 8–23)
CO2: 23 mmol/L (ref 22–32)
Calcium: 10 mg/dL (ref 8.9–10.3)
Chloride: 98 mmol/L (ref 98–111)
Creatinine, Ser: 6 mg/dL — ABNORMAL HIGH (ref 0.61–1.24)
GFR, Estimated: 9 mL/min — ABNORMAL LOW (ref >60)
Glucose, Bld: 160 mg/dL — ABNORMAL HIGH (ref 70–99)
Phosphorus: 5.9 mg/dL — ABNORMAL HIGH (ref 2.5–4.6)
Potassium: 5.6 mmol/L — ABNORMAL HIGH (ref 3.5–5.1)
Sodium: 137 mmol/L (ref 135–145)

## 2022-01-28 LAB — GLUCOSE, CAPILLARY
Glucose-Capillary: 162 mg/dL — ABNORMAL HIGH (ref 70–99)
Glucose-Capillary: 142 mg/dL — ABNORMAL HIGH (ref 70–99)
Glucose-Capillary: 143 mg/dL — ABNORMAL HIGH (ref 70–99)
Glucose-Capillary: 131 mg/dL — ABNORMAL HIGH (ref 70–99)
Glucose-Capillary: 212 mg/dL — ABNORMAL HIGH (ref 70–99)
Glucose-Capillary: 197 mg/dL — ABNORMAL HIGH (ref 70–99)
Glucose-Capillary: 147 mg/dL — ABNORMAL HIGH (ref 70–99)

## 2022-01-28 LAB — CBC
HCT: 33.4 % — ABNORMAL LOW (ref 39.0–52.0)
Hemoglobin: 10.7 g/dL — ABNORMAL LOW (ref 13.0–17.0)
MCH: 31.9 pg (ref 26.0–34.0)
MCHC: 32 g/dL (ref 30.0–36.0)
MCV: 99.7 fL (ref 80.0–100.0)
Platelets: 272 10*3/uL (ref 150–400)
RBC: 3.35 MIL/uL — ABNORMAL LOW (ref 4.22–5.81)
RDW: 15.2 % (ref 11.5–15.5)
WBC: 12.9 10*3/uL — ABNORMAL HIGH (ref 4.0–10.5)
nRBC: 0 % (ref 0.0–0.2)

## 2022-01-28 MED ORDER — HEPARIN SODIUM (PORCINE) 1000 UNIT/ML IJ SOLN
INTRAMUSCULAR | Status: AC
  Administered 2022-01-28: 2000 [IU] via INTRAVENOUS
  Filled 2022-01-28: qty 4

## 2022-01-28 MED ORDER — HEPARIN SODIUM (PORCINE) 1000 UNIT/ML IJ SOLN
2000.0000 [IU] | Freq: Two times a day (BID) | INTRAMUSCULAR | Status: DC | PRN
  Filled 2022-01-28: qty 2

## 2022-01-28 MED ORDER — ALBUMIN HUMAN 25 % IV SOLN
25.0000 g | Freq: Once | INTRAVENOUS | Status: AC
  Administered 2022-01-28: 25 g via INTRAVENOUS

## 2022-01-28 NOTE — Progress Notes (Signed)
[facility] Progress Note Patient Name: Jeffrey Campbell DOB: [birth year] MRN: [id number]   Date of Service  01/28/2022  HPI/Events of Note  Daughter Hollie Salk did call back and have clarified that she is ok for her dad to receive morphine if he is in distress but would want to avoid giving it knowing that it would blunt his cough reflex and will make him pool his secretions and patient does not like being suctioned.  eICU Interventions  Patient does not appear to in acute distress at this time.  Daughter will be at bedside first thing in the morning     Intervention Category Minor Interventions: Communication with other healthcare providers and/or family  Judd Lien 01/28/2022, 12:32 AM

## 2022-01-28 NOTE — Progress Notes (Signed)
   01/28/22 1145  Vitals  Temp (!) 96.2 F (35.7 C)  Temp Source Axillary  BP (!) 133/57  MAP (mmHg) 80  BP Location Right Wrist  BP Method Automatic  Patient Position (if appropriate) Lying  Pulse Rate 70  ECG Heart Rate 70  Resp (!) 34  Oxygen Therapy  SpO2 97 %  Post Treatment  Dialyzer Clearance Lightly streaked  Duration of HD Treatment -hour(s) 3.5 hour(s)  Hemodialysis Intake (mL) 0 mL  Liters Processed 84  Fluid Removed 1500 mL  Tolerated HD Treatment Yes  Post-Hemodialysis Comments tx completed,albumin given to sustain b/p no other problems to note goal met  AVG/AVF Arterial Site Held (minutes) 5 minutes  AVG/AVF Venous Site Held (minutes) 5 minutes  Note  Observations alert no c/o  Fistula / Graft Left Upper arm Arteriovenous fistula  Placement Date: 10/18/17   Orientation: Left  Access Location: Upper arm  Access Type: Arteriovenous fistula  Site Condition No complications  Fistula / Graft Assessment Present;[place];Bruit   Tx complete goal met no s/s of distress 1.5L removed

## 2022-01-28 NOTE — Progress Notes (Signed)
Nephrology Progress Note:  Subjective:   Pt seen in ICU. Pt is now DNR/ DNI. He was extubated yesterday. On HD this am.   Objective Vital signs in last 24 hours: Vitals:   01/28/22 1015 01/28/22 1030 01/28/22 1045 01/28/22 1100  BP: (!) 113/56 (!) 110/57 (!) 102/59 (!) 105/54  Pulse: 70 70 70 70  Resp: (!) 28 (!) 35 (!) 33 (!) 35  Temp:      TempSrc:      SpO2: 97% 96% 94% 94%  Weight:      Height:       Weight change: -4.8 kg  Intake/Output Summary (Last 24 hours) at 01/28/2022 1113 Last data filed at 01/28/2022 0800 Gross per 24 hour  Intake 750 ml  Output 570 ml  Net 180 ml    Physical Exam:       alert, elderly male, ^wob, drooling saliva  no jvd  Chest cta bilat, +rhonchi ant  Cor reg no RG  Abd soft ntnd no ascites   Ext no LE edema   Alert, not responding verbally  LUE AVF +bruit    Dialysis Orders:  MWF [facility]  4h    400/1.5   54.5kg  2/2 bath   LUE AVF  Heparin 3000 Calcitriol 0.5 mcg PO qHD Sensipar '120mg'$  qHD     Assessment/Plan: AMS - likely 2/2 bacteremia and PNA. S/p EEG with no seizure activity. Also hx of recurrent PEA arrest, and hx of dementia.   Acute respiratory failure - recurrent issue. Was intubated initially. Concern for aspiration on CT. Able to be extubated 10/18 however required reintubation for cardiac arrest on 10/21.  Extubated 10/26.  S/p bronch with thick secretions.  DC'd to floor 10/29 then had another PEA arrest 10/30 and was back on vent in ICU. Now pt is DNR/ DNI and he was extubated yesterday.  Shock -  resolved Bacteremia - BC +streptococcus pneumoniae. SP course of abx ESRD -  usual HD MWF, getting TTS HD this week. HD today.  BP/ volume- euvolemic on exam, 1kg under dry wt, no edema  Anemia of CKD - off of ESA now in setting of acute stroke.  No iron in setting of acute infection. (Note he did get aranesp 10/20 prior to that) Secondary Hyperparathyroidism - paused calcitriol with hypercalcemia.  No binders now. Limited to baths  available inpatient  Acute CVA - MRI with multiple infarcts c/w shower of microemboli.  Per primary team and neuro  CAD, CHB s/p PPM DMT2 - per primary team  COPD   Kelly Splinter, MD 01/28/2022, 11:13 AM  Recent Labs  Lab 01/27/22 0342 01/28/22 0407  HGB 10.0* 10.7*  ALBUMIN 2.3* 2.6*  CALCIUM 9.2 10.0  PHOS 3.9 5.9*  CREATININE 4.79* 6.00*  K 4.7 5.6*     Inpatient medications:   stroke: early stages of recovery book   Does not apply Once   aspirin  81 mg Per Tube Daily   atorvastatin  20 mg Per Tube Daily   brinzolamide  1 drop Both Eyes BID   And   brimonidine  1 drop Both Eyes BID   carvedilol  3.125 mg Per Tube BID WC   Chlorhexidine Gluconate Cloth  6 each Topical Q0600   Chlorhexidine Gluconate Cloth  6 each Topical Q0600   Chlorhexidine Gluconate Cloth  6 each Topical Q0600   Chlorhexidine Gluconate Cloth  6 each Topical Q0600   donepezil  10 mg Per Tube QHS   famotidine  20  mg Per Tube Daily   feeding supplement (PROSource TF20)  60 mL Per Tube Daily   fiber supplement (BANATROL TF)  60 mL Per Tube BID   heparin injection (subcutaneous)  5,000 Units Subcutaneous Q12H   insulin aspart  0-15 Units Subcutaneous Q4H   insulin aspart  2 Units Subcutaneous Q4H   levothyroxine  150 mcg Per Tube Q0600   memantine  10 mg Per Tube BID   mouth rinse  15 mL Mouth Rinse Q2H    sodium chloride Stopped (01/23/22 1921)   feeding supplement (PEPTAMEN 1.5 CAL) 50 mL/hr at 01/28/22 0600   sodium chloride     sodium chloride     sodium chloride, acetaminophen (TYLENOL) oral liquid 160 mg/5 mL, albuterol, heparin sodium (porcine), mouth rinse, sodium chloride

## 2022-01-28 NOTE — Progress Notes (Addendum)
PT Cancellation Note  Patient Details Name: Jeffrey Campbell MRN: [id number] DOB: 07-Apr-1932   Cancelled Treatment:    Reason Eval/Treat Not Completed: Patient at procedure or test/unavailable. Pt currently in HD treatment. Vitals are soft. Will plan to follow-up later as able.  Addendum 14:08 -- Pt satting at 87% at rest. RN requesting PT hold at this time. Will plan to follow-up tomorrow as able.   Moishe Spice, PT, DPT Acute Rehabilitation Services  Office: [place] 01/28/2022, 9:01 AM

## 2022-01-28 NOTE — Progress Notes (Signed)
NAME:  Jeffrey Campbell, MRN:  [id number], DOB:  1932-12-04, LOS: 100 ADMISSION DATE:  01/22/2022, CONSULTATION DATE:  01/13/2022 REFERRING MD:  EDP, CHIEF COMPLAINT:  AMS. ARF   History of Present Illness:  88 year old male with extensive PMH including ESRD on HD MWF. Presents to ED on 10/17 after being found unresponsive at home. Per family patient recent admission 8/23 for altered mental status, UTI. Now with reported tiredness, weakness over last 24 hours. This morning when daughter arrived at patient home, she reports patient was unresponsive making gurgling sounds, on arrival to ED patient remains minimally responsive with tachypnea and hypotension requiring intubation. CXR with left lower lobe infiltrate. CT head negative. CT C/A/P with extensive bilateral infiltirates and noted left lower lobe infiltrate concerning for aspiration. Given Cefepime/Vancomycin. Critical Care Consulted for admission.  10/21 PCCM reconsulted for complete whiteout of left lung, transferred to ICU and intubated, brief PEA arrest with ROSC in  4 minutes I/O placed  Patient was extubated on 10/26, he remained generalized weak, unable to clear his secretions, he was watched in ICU for 48 hours, was transferred to progressive care unit on 10/28.  On 10/29 patient was found lethargic, minimally responsive, x-ray chest was done which showed whiteout left lung, critical care was consulted and in between patient went into respiratory arrest and coded, ROSC was achieved after 5 minutes, he was intubated and was transferred back to ICU for further care  Pertinent  Medical History  HTN, DMII, CAD, CHB h/o PPM, ESRD on HD MWF, COPD,   Significant Hospital Events: Including procedures, antibiotic start and stop dates in addition to other pertinent events   ETT 10/17-10/18 10/20 transferred out of ICU 10/21 PCCM reconsulted for complete whiteout of left lung, transferred to ICU and intubated, brief PEA arrest with ROSC in  4 minutes  I/O placed 10/22 CT Head: Small acute infarct in the inferior left cerebellum 10/24 MRI brain: Acute infarct of the left and right cerebellum, right pons as well as multiple scattered infarcts in both cerebral hemisphere 10/24 MRA head: Severe stenosis of L ICA, moderate stenosis of R ICA and moderate stenosis of distal right vertebral artery 10/25 CXR worsening lower lung atelectasis/infiltrate 10/25 bronchoscopy w/ BAL 10/26 extubated to nasal cannula 10/29 became hypoxic and went to PEA cardiac arrest, reintubated and transferred to ICU  Interim History / Subjective:  After long conversation with the patient's family, they decided to keep him DNR/DNI He was extubated yesterday Currently receiving hemodialysis  Objective   Blood pressure (!) 113/56, pulse 70, temperature (!) 97.1 F (36.2 C), temperature source Axillary, resp. rate (!) 28, height _0  (1.753 m), weight 54.9 kg, SpO2 97 %.        Intake/Output Summary (Last 24 hours) at 01/28/2022 1017 Last data filed at 01/28/2022 0800 Gross per 24 hour  Intake 750 ml  Output 570 ml  Net 180 ml   Filed Weights   01/26/22 1844 01/27/22 0500 01/28/22 0500  Weight: 60 kg 55 kg 54.9 kg   Examination: Physical exam: General: Chronically ill-appearing male, lying on the bed HEENT: [place]/AT, eyes anicteric.  moist mucus membranes, pooling secretions in the back of throat Neuro: Awake but generalized weak, not following commands Chest: Coarse breath sounds, no wheezes or rhonchi Heart: Irregularly irregular, no murmurs or gallops Abdomen: Soft, nontender, nondistended, bowel sounds present Skin: No rash   Resolved Hospital Problem list   Septic shock  Assessment & Plan:  S/p PEA cardiac arrest  x 2 in the setting of hypoxia due to mucous plugging Acute hypoxic respiratory failure due to aspiration pneumonia and mucous plugging Pneumococcal pneumonia upon presentation  Patient was extubated yesterday with intention to not  reintubate as he struggles with clearing of secretions and unfortunately developed mucous plugs and had a cardiac arrest twice in the setting Patient's family would like to start morphine if he is struggling Currently on nasal cannula oxygen Patient completed antibiotic therapy for pneumonia  Acute bilateral multiple strokes Acute septic encephalopathy with background of advanced dementia Chronic atrial fibrillation, not on anticoagulation He remains high risk of recurrent stroke due to chronic A-fib He is not on anticoagulation due to high risk of falls  Continue aspirin and statin Heart rate remain well controlled   ESRD on HD MWF Anemia of Chronic Disease Nephrology is following, dialysis per schedule Monitor H&H and transfuse if less than 7   Demand cardiac ischemia CHB s/p pacemaker Acute systolic CHF Continue Coreg at 3.125 mg Continue telemetry monitoring  Hypothyroidism Continue levothyroxine   DMII Monitor fingerstick with goal 140-180 Continue sliding scale insulin    Severe protein calorie malnutrition Continue Tube feeds  Best Practice (right click and "Reselect all SmartList Selections" daily)   Diet/type: Tube feeds DVT prophylaxis: prophylactic heparin  GI prophylaxis: PPI Lines: N/A Foley:  N/A Code Status: DNR/DNI Last date of multidisciplinary goals of care discussion [11/2, please Ipal note.   Labs   CBC: Recent Labs  Lab 01/24/22 0134 01/24/22 1807 01/25/22 0435 01/26/22 0456 01/27/22 0342 01/28/22 0407  WBC 13.9*  --  14.8* 12.3* 11.0* 12.9*  HGB 10.4* 9.2* 9.8* 9.4* 10.0* 10.7*  HCT 33.4* 27.0* 31.2* 29.1* 31.2* 33.4*  MCV 100.9*  --  101.6* 99.7 101.3* 99.7  PLT 259  --  294 296 214 051    Basic Metabolic Panel: Recent Labs  Lab 01/24/22 0134 01/24/22 1807 01/25/22 0435 01/26/22 0456 01/27/22 0342 01/28/22 0407  NA 140 136 140 140 136 137  K 5.0 4.9 5.6* 5.1 4.7 5.6*  CL 97*  --  99 98 98 98  CO2 26  --  23 21* 23 23   GLUCOSE 114*  --  90 169* 85 160*  BUN 73*  --  104* 138* 73* 103*  CREATININE 5.35*  --  6.97* 8.59* 4.79* 6.00*  CALCIUM 10.9*  --  10.7* 10.3 9.2 10.0  PHOS 4.6  --  5.7* 6.0* 3.9 5.9*   GFR: Estimated Creatinine Clearance: 6.9 mL/min (A) (by C-G formula based on SCr of 6 mg/dL (H)). Recent Labs  Lab 01/25/22 0435 01/26/22 0456 01/27/22 0342 01/28/22 0407  WBC 14.8* 12.3* 11.0* 12.9*    Liver Function Tests: Recent Labs  Lab 01/24/22 0134 01/25/22 0435 01/26/22 0456 01/27/22 0342 01/28/22 0407  ALBUMIN 3.0* 2.8* 2.6* 2.3* 2.6*   No results for input(s): "LIPASE", "AMYLASE" in the last 168 hours. No results for input(s): "AMMONIA" in the last 168 hours.  ABG    Component Value Date/Time   PHART 7.478 (H) 01/24/2022 1807   PCO2ART 35.0 01/24/2022 1807   PO2ART 149 (H) 01/24/2022 1807   HCO3 26.0 01/24/2022 1807   TCO2 27 01/24/2022 1807   ACIDBASEDEF 5.0 (H) 07/28/2016 0850   ACIDBASEDEF 5.0 (H) 07/28/2016 0850   O2SAT 99 01/24/2022 1807     Coagulation Profile: No results for input(s): "INR", "PROTIME" in the last 168 hours.   Cardiac Enzymes: No results for input(s): "CKTOTAL", "CKMB", "CKMBINDEX", "TROPONINI" in the last  168 hours.  HbA1C: Hgb A1c MFr Bld  Date/Time Value Ref Range Status  01/26/2022 01:17 PM 5.2 4.8 - 5.6 % Final    Comment:    (NOTE) Pre diabetes:          5.7%-6.4%  Diabetes:              >6.4%  Glycemic control for   <7.0% adults with diabetes   09/19/2016 04:07 AM 5.5 4.8 - 5.6 % Final    Comment:    (NOTE)         Pre-diabetes: 5.7 - 6.4         Diabetes: >6.4         Glycemic control for adults with diabetes: <7.0     CBG: Recent Labs  Lab 01/27/22 1555 01/27/22 1934 01/28/22 0037 01/28/22 0458 01/28/22 0737  GLUCAP 183* 156* 143* 162* 142*      [name], MD [facility] See Amion for pager If no response to pager, please call [phone] until 7pm After 7pm, Please call E-link  [phone]

## 2022-01-29 DIAGNOSIS — I5021 Acute systolic (congestive) heart failure: Secondary | ICD-10-CM | POA: Diagnosis not present

## 2022-01-29 DIAGNOSIS — J9601 Acute respiratory failure with hypoxia: Secondary | ICD-10-CM | POA: Diagnosis not present

## 2022-01-29 DIAGNOSIS — G9340 Encephalopathy, unspecified: Secondary | ICD-10-CM | POA: Diagnosis not present

## 2022-01-29 DIAGNOSIS — N186 End stage renal disease: Secondary | ICD-10-CM | POA: Diagnosis not present

## 2022-01-29 DIAGNOSIS — I639 Cerebral infarction, unspecified: Secondary | ICD-10-CM

## 2022-01-29 LAB — CBC
HCT: 31.7 % — ABNORMAL LOW (ref 39.0–52.0)
Hemoglobin: 9.9 g/dL — ABNORMAL LOW (ref 13.0–17.0)
MCH: 31.5 pg (ref 26.0–34.0)
MCHC: 31.2 g/dL (ref 30.0–36.0)
MCV: 101 fL — ABNORMAL HIGH (ref 80.0–100.0)
Platelets: 291 10*3/uL (ref 150–400)
RBC: 3.14 MIL/uL — ABNORMAL LOW (ref 4.22–5.81)
RDW: 15.3 % (ref 11.5–15.5)
WBC: 11.6 10*3/uL — ABNORMAL HIGH (ref 4.0–10.5)
nRBC: 0 % (ref 0.0–0.2)

## 2022-01-29 LAB — GLUCOSE, CAPILLARY
Glucose-Capillary: 108 mg/dL — ABNORMAL HIGH (ref 70–99)
Glucose-Capillary: 129 mg/dL — ABNORMAL HIGH (ref 70–99)
Glucose-Capillary: 136 mg/dL — ABNORMAL HIGH (ref 70–99)
Glucose-Capillary: 137 mg/dL — ABNORMAL HIGH (ref 70–99)
Glucose-Capillary: 141 mg/dL — ABNORMAL HIGH (ref 70–99)
Glucose-Capillary: 155 mg/dL — ABNORMAL HIGH (ref 70–99)
Glucose-Capillary: 166 mg/dL — ABNORMAL HIGH (ref 70–99)

## 2022-01-29 LAB — RENAL FUNCTION PANEL
Albumin: 2.9 g/dL — ABNORMAL LOW (ref 3.5–5.0)
Anion gap: 12 (ref 5–15)
BUN: 63 mg/dL — ABNORMAL HIGH (ref 8–23)
CO2: 28 mmol/L (ref 22–32)
Calcium: 10.2 mg/dL (ref 8.9–10.3)
Chloride: 97 mmol/L — ABNORMAL LOW (ref 98–111)
Creatinine, Ser: 4.02 mg/dL — ABNORMAL HIGH (ref 0.61–1.24)
GFR, Estimated: 14 mL/min — ABNORMAL LOW (ref >60)
Glucose, Bld: 176 mg/dL — ABNORMAL HIGH (ref 70–99)
Phosphorus: 5.1 mg/dL — ABNORMAL HIGH (ref 2.5–4.6)
Potassium: 4.9 mmol/L (ref 3.5–5.1)
Sodium: 137 mmol/L (ref 135–145)

## 2022-01-29 MED ORDER — ORAL CARE MOUTH RINSE
15.0000 mL | OROMUCOSAL | Status: DC
  Administered 2022-01-29 – 2022-01-30 (×6): 15 mL via OROMUCOSAL

## 2022-01-29 MED ORDER — HYDROMORPHONE HCL 1 MG/ML IJ SOLN
0.5000 mg | INTRAMUSCULAR | Status: DC | PRN
  Administered 2022-01-29: 0.5 mg via INTRAVENOUS
  Filled 2022-01-29: qty 0.5

## 2022-01-29 MED ORDER — ORAL CARE MOUTH RINSE: 15.0000 mL | OROMUCOSAL | Status: DC | PRN

## 2022-01-29 MED ORDER — MORPHINE SULFATE (PF) 2 MG/ML IV SOLN
2.0000 mg | INTRAVENOUS | Status: DC | PRN
  Administered 2022-01-29: 4 mg via INTRAVENOUS
  Filled 2022-01-29: qty 1
  Filled 2022-01-29: qty 2

## 2022-01-29 MED ORDER — PEPTAMEN 1.5 CAL PO LIQD
1000.0000 mL | ORAL | Status: DC
  Administered 2022-01-29: 1000 mL
  Filled 2022-01-29 (×3): qty 1000

## 2022-01-29 NOTE — Significant Event (Signed)
Rapid Response Event Note   Reason for Call :  Hypoxia, decreased LOC  Initial Focused Assessment:  Pt lying in bed with eyes closed. His breathing is labored. He is pale/diaphoretic. Lungs with rales/rhonchi t/o. Skin cool/clammy. He will grimace to pain and open his eyes to voice. He will not speak or follow commands.   HR-69, BP-139/57, RR-32, SpO2-83% on NRB  Interventions:  CBG-166 NTS TF turned off Plan of Care:  Pt is a full DNR. Keep pt comfortable. May wean FiO2 as SpO2 allows. Family updated by Carlisle Beers NP. Please call RRT if further assistance needed.  Event Summary:   MD Notified: Eddie Dibbles PCCM NP Call Leonard, Nikia Mangino Anderson, RN

## 2022-01-29 NOTE — Progress Notes (Signed)
Progress Note   Patient: Jeffrey Campbell:[id number] DOB: 04-22-1932 DOA: 01/13/2022     17 DOS: the patient was seen and examined on 01/29/2022   Brief hospital course: Jeffrey Campbell was admitted to the hospital with the working diagnosis of acute hypoxemic respiratory failure due to community acquired pneumonia.   88 yo male with the past medical history of ESRD on HD, who was brought to the hospital after finding him unresponsive by his family. 24 hr prior patient had fatigue and was feeling weak. In the ED patient as intubated. His blood pressure 138/59, HR 60, RR 16 and 02 saturation 100% on mechanical ventilation. His lungs had rhonchi but not wheezing, heart with S1 and S2 present and rhythmic, no gallops, abdomen with no distention and no lower extremity edema.   Chest radiograph with cardiomegaly, positive patchy infiltrate at the right middle lobe, pacemaker in place with on atrial and one ventricular lead.   Chest CT with bilateral faint ground glass opacities, right middle lobe infiltrates, left lower lobe infiltrate and small bilateral pleural effusions.   EKG 63 bpm, left axis deviation, left bundle branch block, atrial and ventricular paced rhythm, with no significant ST segment or T wave changes, positive LVH.   ETT 10/17-10/18 10/20 transferred out of ICU 10/21 PCCM reconsulted for complete whiteout of left lung, transferred to ICU and intubated, brief PEA arrest with ROSC in  4 minutes I/O placed 10/22 CT Head: Small acute infarct in the inferior left cerebellum 10/24 MRI brain: Acute infarct of the left and right cerebellum, right pons as well as multiple scattered infarcts in both cerebral hemisphere 10/24 MRA head: Severe stenosis of L ICA, moderate stenosis of R ICA and moderate stenosis of distal right vertebral artery 10/25 CXR worsening lower lung atelectasis/infiltrate 10/25 bronchoscopy w/ BAL 10/26 extubated to nasal cannula 10/29 became hypoxic and went to PEA  cardiac arrest, reintubated and transferred to ICU  11/01 extubated, change code status to DNR.  11/03 transfer to [facility].    Assessment and Plan: * Encephalopathy Acute metabolic encephalopathy  Patient with dementia. Today he is not verbal, and not following commands Continue namenda and close neuro checks per unit protocol Aspiration precautions,   Acute respiratory failure with hypoxia (HCC) Aspiration (pneumococcal) pneumonia (present on admission), right middle lobe and left lower lobe. Positive mucous plugging.   Patient with prolonged hospitalization with multiple re intubations, he is very weak and deconditioned. Extubated on 11/01, and change code status to DNR/ DNI.  This am patient had respiratory distress, he was placed on 100% non re breather mask and tube feedings were held.   At the time of my examination patient contin to have increased work of breathing with accessory muscle use. I spoke with his daughter at the bedside and she understands high risk of worsening respiratory failure and high risk of recurrent aspiration.  Confirms patient is DNR and DNI.  Plan to continue with conservative care with gentle tube feedings rate at 30 ml per hr with aspiration precautions.  Continue supplemental 02 per to keep 02 saturation 92% or greater.  Patient has completed antibiotic therapy.  Considering his ESRD will change morphine to hydromorphone as needed for dyspnea.    ESRD (end stage renal disease) ([place]) Patient with no volume overload. He is very weak and deconditioned His daughter would like to continue with renal replacement therapy for now.   Acute systolic heart failure [facility]) Patient with PEA cardiac arrests due to hypoxemia. Echocardiogram with  reduced LV systolic function with EF 45 to 50%, with global hypokinesis.  RV systolic function with moderate reduction, RVSP 45.9  Severe dilatation of LA and RA. Mild to moderate TR.   Systolic blood pressure 643 to 130  mmHg Continue with carvedilol Fluid management per HD  Persistent atrial fibrillation (HCC) Continue rate control with carvedilol.  Patient is not on anticoagulation due to dementia, fall risk and generalized weakness, (decision made prior to admission per his medical team).   Protein-calorie malnutrition, severe Continue nutritional supplements and tube feedings with aspiration precautions.   CVA (cerebral vascular accident) [facility]) Brain MRI with acute infarcts, left and right cerebellum, right pons as wall as multiple scattered infarcts in both cerebral hemispheres.  Severe stenosis of L ICA, moderate stenosis R ICA.   Continue medical therapy, patient with poor prognosis.  Neurology has recommended to continue with aspirin, patient is high risk for anticoagulation due to  dementia, fall risk and generalized weakness.         Subjective: patient deconditioned, not vernal and not following commands, he has a non re- breather mask in place  Physical Exam: Vitals:   01/29/22 0727 01/29/22 1013 01/29/22 1121 01/29/22 1230  BP: 115/64 139/60 131/69   Pulse: 69 70 70   Resp: (!) 28 (!) 28 (!) 26   Temp: 98.5 F (36.9 C)   97.9 F (36.6 C)  TempSrc: Oral   Axillary  SpO2: 100% 98% 99%   Weight:      Height:       Neurology awake, not following commands and not verbal ENT with mild pallor Cardiovascular with S1 and S2 present with no gallops Respiratory with scattered rhonchi with no wheezing Abdomen with no distention  No lower extremity edema  Data Reviewed:    Family Communication: I spoke with patient's daughter at the bedside, we talked in detail about patient's condition, plan of care and prognosis and all questions were addressed.   Disposition: Status is: Inpatient Remains inpatient appropriate because: renal failure, respiratory failure,   Planned Discharge Destination: Home  Author: Tawni Millers, MD 01/29/2022 12:55 PM  For on call review  [email or web address].

## 2022-01-29 NOTE — Assessment & Plan Note (Addendum)
Acute metabolic encephalopathy  Patient with dementia. Continue to be none verbal, and not following commands Continue namenda and close neuro checks per unit protocol Aspiration precautions.   Not able to perform swallow evaluation due to neurologic status, continue with tube feeding per cortrack.  Patient will not be a good candidate for PEG tube due to encephalopathy, and recurrent aspiration.  Poor prognosis, will continue to update his daughter.  Patient's daughter has been verbally abusive to palliative care team on prior encounters in the care of her mother. For now will hold on palliative care consultation.

## 2022-01-29 NOTE — Assessment & Plan Note (Addendum)
Continue rate control with carvedilol.  Patient is not on anticoagulation due to dementia, fall risk and generalized weakness, (decision made prior to admission per his medical team).

## 2022-01-29 NOTE — Care Management Important Message (Signed)
Important Message  Patient Details  Name: Jeffrey Campbell MRN: [id number] Date of Birth: 02-25-1933   Medicare Important Message Given:  Yes     [name] 01/29/2022, 8:57 AM

## 2022-01-29 NOTE — Assessment & Plan Note (Signed)
Continue nutritional supplements and tube feedings with aspiration precautions.

## 2022-01-29 NOTE — Assessment & Plan Note (Signed)
Aspiration (pneumococcal) pneumonia (present on admission), right middle lobe and left lower lobe. Positive mucous plugging.   Patient with prolonged hospitalization with multiple re intubations, he is very weak and deconditioned. Extubated on 11/01, and change code status to DNR/ DNI.  This am patient had respiratory distress, he was placed on 100% non re breather mask and tube feedings were held.   At the time of my examination patient contin to have increased work of breathing with accessory muscle use. I spoke with his daughter at the bedside and she understands high risk of worsening respiratory failure and high risk of recurrent aspiration.  Confirms patient is DNR and DNI.  Plan to continue with conservative care with gentle tube feedings rate at 30 ml per hr with aspiration precautions.  Continue supplemental 02 per to keep 02 saturation 92% or greater.  Patient has completed antibiotic therapy.  Considering his ESRD will change morphine to hydromorphone as needed for dyspnea.

## 2022-01-29 NOTE — Assessment & Plan Note (Signed)
Brain MRI with acute infarcts, left and right cerebellum, right pons as wall as multiple scattered infarcts in both cerebral hemispheres.  Severe stenosis of L ICA, moderate stenosis R ICA.   Continue medical therapy, patient with poor prognosis.  Neurology has recommended to continue with aspirin, patient is high risk for anticoagulation due to  dementia, fall risk and generalized weakness.

## 2022-01-29 NOTE — Progress Notes (Signed)
Nephrology Progress Note:  Subjective:   Pt seen in ICU. Pt is now DNR/ DNI. He was extubated yesterday. On HD this am.   Objective Vital signs in last 24 hours: Vitals:   01/29/22 0521 01/29/22 0727 01/29/22 1013 01/29/22 1121  BP: (!) 119/58 115/64 139/60 131/69  Pulse: 70 69 70 70  Resp: (!) 28 (!) 28 (!) 28 (!) 26  Temp: 97.8 F (36.6 C) 98.5 F (36.9 C)    TempSrc: Axillary Oral    SpO2: 100% 100% 98% 99%  Weight: 55.2 kg     Height:       Weight change: 0.3 kg  Intake/Output Summary (Last 24 hours) at 01/29/2022 1226 Last data filed at 01/29/2022 1013 Gross per 24 hour  Intake 0 ml  Output --  Net 0 ml    Physical Exam:       alert, elderly male, ^wob, drooling saliva  no jvd  Chest cta bilat, +rhonchi ant  Cor reg no RG  Abd soft ntnd no ascites   Ext no LE edema   Alert, not responding verbally  LUE AVF +bruit    Dialysis Orders:  MWF [facility]  4h    400/1.5   54.5kg  2/2 bath   LUE AVF  Heparin 3000 Calcitriol 0.5 mcg PO qHD Sensipar '120mg'$  qHD     Assessment/Plan: AMS -  2/2 bacteremia and PNA. S/p EEG with no seizure activity. Also hx of recurrent PEA arrest, and hx of dementia.   Acute respiratory failure - recurrent issue. Was intubated initially w/ concern for aspiration on CT.  Extubated 10/18 however required reintubation for cardiac arrest on 10/21.  Extubated 10/26.  S/p bronch with thick secretions.  DC'd to floor 10/29 then had another PEA arrest 10/30 and was back on vent in ICU. Extubated 11/2. Now pt is DNR/ DNI. Bacteremia - BC +streptococcus pneumoniae. SP course of abx ESRD -  usual HD MWF, getting TTS HD this week. HD Sat BP/ volume- euvolemic on exam, 1kg under dry wt, no edema, BP's normal. Keep even w/ HD Sat.   Anemia of CKD - off of ESA now in setting of acute stroke.  No iron in setting of acute infection. (Note he did get aranesp 10/20 prior to that) Secondary Hyperparathyroidism - paused calcitriol with hypercalcemia.  No binders now.  Limited to baths available inpatient  Acute CVA - MRI with multiple infarcts c/w shower of microemboli.  Per primary team and neuro  CAD, CHB s/p PPM DMT2 - per primary team  COPD   Kelly Splinter, MD 01/29/2022, 12:26 PM  Recent Labs  Lab 01/28/22 0407 01/29/22 0035  HGB 10.7* 9.9*  ALBUMIN 2.6* 2.9*  CALCIUM 10.0 10.2  PHOS 5.9* 5.1*  CREATININE 6.00* 4.02*  K 5.6* 4.9     Inpatient medications:   stroke: early stages of recovery book   Does not apply Once   aspirin  81 mg Per Tube Daily   atorvastatin  20 mg Per Tube Daily   brinzolamide  1 drop Both Eyes BID   And   brimonidine  1 drop Both Eyes BID   carvedilol  3.125 mg Per Tube BID WC   Chlorhexidine Gluconate Cloth  6 each Topical Q0600   Chlorhexidine Gluconate Cloth  6 each Topical Q0600   Chlorhexidine Gluconate Cloth  6 each Topical Q0600   Chlorhexidine Gluconate Cloth  6 each Topical Q0600   donepezil  10 mg Per Tube QHS   famotidine  20 mg Per Tube Daily   feeding supplement (PROSource TF20)  60 mL Per Tube Daily   fiber supplement (BANATROL TF)  60 mL Per Tube BID   heparin injection (subcutaneous)  5,000 Units Subcutaneous Q12H   insulin aspart  0-15 Units Subcutaneous Q4H   insulin aspart  2 Units Subcutaneous Q4H   levothyroxine  150 mcg Per Tube Q0600   memantine  10 mg Per Tube BID   mouth rinse  15 mL Mouth Rinse Q2H   mouth rinse  15 mL Mouth Rinse 4 times per day    sodium chloride Stopped (01/23/22 1921)   feeding supplement (PEPTAMEN 1.5 CAL) Stopped (01/29/22 0700)   sodium chloride     sodium chloride     sodium chloride, acetaminophen (TYLENOL) oral liquid 160 mg/5 mL, albuterol, heparin sodium (porcine), morphine injection, mouth rinse, mouth rinse, sodium chloride

## 2022-01-29 NOTE — Progress Notes (Signed)
Rapid call to bedside. Pt diaphoretic, tachypneic (38) with O2 Sats 82% on NRBM. BS diminished audible aeration with audible loose rhonchi w/out stethoscope. Pt repositioned, increase flow to NRBM, NTSx w/weak productive cough and swallow. Rhonchi cleared but seration remained decreased. Best O2 Sat 91% holding 90% rr 28-30 w/noticeable increased effort. RN, PA '@bedside'$  and family notified.

## 2022-01-29 NOTE — Assessment & Plan Note (Signed)
Patient with no volume overload. He is very weak and deconditioned His daughter would like to continue with renal replacement therapy for now.

## 2022-01-29 NOTE — Evaluation (Addendum)
Physical Therapy Re-Evaluation Patient Details Name: Jeffrey Campbell MRN: [id number] DOB: 08-25-1932 Today's Date: 01/29/2022  History of Present Illness  88 y.o. male admitted 10/17 with AMS, hypotensive.  VDRF 10/17-10/18.  10/21 PCCM reconsulted for complete whiteout of left lung, transferred to ICU and intubated, brief PEA arrest with ROSC in 4 minutes. 10/22 CT with acute Lt cerebellar infarct. PT signed off 10/30 following second PEA arrest and re-intubation on 10/29, extubated 11/1. Pt with rapid response due to declining respiratory status 11/3, pt on NRB mask. PMHx: HTN, HLD, T2DM, hypothyroidism, COPD, OSA, CHF, PAD, CAD with complete heart block s/p PPM, mitral prolapse with mitral regurgitation, ESRD on HD MWF.    Clinical Impression  Pt in bed upon arrival of PT, no family present and pt not following commands (other than moving bilateral toes) or attempting verbalizations or non-verbal communication at this time. The pt allowed for PROM of BLE but demos grimacing with attempts to passively range at neck or BUE. The pt did tolerate transition to chair position in bed with VSS, but does not show change in alertness or participation with change in position. Continue to recommend SNF due to significant level of assist needed at this time.    Recommendations for follow up therapy are one component of a multi-disciplinary discharge planning process, led by the attending physician.  Recommendations may be updated based on patient status, additional functional criteria and insurance authorization.  Follow Up Recommendations Skilled nursing-short term rehab (<3 hours/day) Can patient physically be transported by private vehicle: No    Assistance Recommended at Discharge Frequent or constant Supervision/Assistance  Patient can return home with the following  Two people to help with walking and/or transfers;Two people to help with bathing/dressing/bathroom;Assistance with  cooking/housework;Assistance with feeding;Direct supervision/assist for medications management;Direct supervision/assist for financial management;Assist for transportation;Help with stairs or ramp for entrance    Equipment Recommendations Wheelchair (measurements PT);Wheelchair cushion (measurements PT);Hospital bed  Recommendations for Other Services       Functional Status Assessment Patient has had a recent decline in their functional status and demonstrates the ability to make significant improvements in function in a reasonable and predictable amount of time.     Precautions / Restrictions Precautions Precautions: Fall Precaution Comments: flexi seal, cortrak, NRB Restrictions Weight Bearing Restrictions: No      Mobility  Bed Mobility Overal bed mobility: Needs Assistance Bed Mobility: Sit to Supine, Supine to Sit     Supine to sit: Total assist Sit to supine: Total assist   General bed mobility comments: pt with no attempt to assist, used chair position in bed with VSS    Transfers                   General transfer comment: deferred due to lack of command following     Modified Rankin (Stroke Patients Only) Modified Rankin (Stroke Patients Only) Pre-Morbid Rankin Score: Moderate disability Modified Rankin: Severe disability     Balance Overall balance assessment: Needs assistance Sitting-balance support: No upper extremity supported, Feet supported Sitting balance-Leahy Scale: Zero Sitting balance - Comments: dependent on posterior support, falling to L Postural control: Left lateral lean                                   Pertinent Vitals/Pain Pain Assessment Pain Assessment: Faces Faces Pain Scale: Hurts even more Pain Location: with attempts to PROM neck to R or into  extension or LUE Pain Descriptors / Indicators: Grimacing, Guarding, Moaning Pain Intervention(s): Limited activity within patient's tolerance, Monitored during  session, Repositioned    Home Living Family/patient expects to be discharged to:: Private residence Living Arrangements: Children;Other (Comment) Available Help at Discharge: Family;Available PRN/intermittently Type of Home: House Home Access: Level entry       Home Layout: One level Home Equipment: Cane - single point;Shower seat Additional Comments: Daughter stays part time and other time he has aides that come occasionally per notes. Unsure of home information as pt unable to report    Prior Function Prior Level of Function : Patient poor historian/Family not available             Mobility Comments: unable to report; information obtained from initial PT eval and chart ADLs Comments: Pt unable to report     Hand Dominance   Dominant Hand: Right    Extremity/Trunk Assessment   Upper Extremity Assessment Upper Extremity Assessment: Defer to OT evaluation    Lower Extremity Assessment Lower Extremity Assessment: Generalized weakness;RLE deficits/detail;LLE deficits/detail RLE Deficits / Details: only active movement at toes, low muscle bulk, WFL PROM LLE Deficits / Details: no active movement other than at toes, PROM WFL    Cervical / Trunk Assessment Cervical / Trunk Assessment: Kyphotic;Other exceptions Cervical / Trunk Exceptions: maintains cervical flexion and L rotation  Communication   Communication: HOH  Cognition Arousal/Alertness: Lethargic Behavior During Therapy: Flat affect Overall Cognitive Status: Impaired/Different from baseline Area of Impairment: Following commands, Attention, Safety/judgement, Awareness, Problem solving                   Current Attention Level: Focused   Following Commands: Follows one step commands inconsistently, Follows one step commands with increased time Safety/Judgement: Decreased awareness of safety, Decreased awareness of deficits Awareness: Intellectual Problem Solving: Slow processing, Decreased initiation,  Requires verbal cues, Requires tactile cues, Difficulty sequencing General Comments: pt only following command to wiggle toes (did complete bilaterally) no other attempts at verbalizations or command following in session.        General Comments General comments (skin integrity, edema, etc.): VSS with HOB elevated to 50 deg.    Exercises General Exercises - Upper Extremity Shoulder Flexion: PROM, Both, Seated (pt resisting) Elbow Flexion: PROM, Both, 10 reps, Supine (pt resists on LUE) Elbow Extension: PROM, Both, 10 reps, Supine (pt resists on RUE) General Exercises - Lower Extremity Ankle Circles/Pumps: PROM, Both, 10 reps, Supine Heel Slides: PROM, Both, 10 reps, Supine   Assessment/Plan    PT Assessment Patient needs continued PT services  PT Problem List Decreased activity tolerance;Decreased balance;Decreased mobility;Decreased knowledge of use of DME;Decreased safety awareness;Decreased knowledge of precautions;Cardiopulmonary status limiting activity;Decreased cognition;Decreased range of motion;Decreased strength       PT Treatment Interventions DME instruction;Gait training;Functional mobility training;Therapeutic activities;Therapeutic exercise;Balance training;Patient/family education    PT Goals (Current goals can be found in the Care Plan section)  Acute Rehab PT Goals Patient Stated Goal: unable to state due to intubated PT Goal Formulation: With patient Time For Goal Achievement: 02/12/22 Potential to Achieve Goals: Fair    Frequency Min 2X/week        AM-PAC PT "6 Clicks" Mobility  Outcome Measure Help needed turning from your back to your side while in a flat bed without using bedrails?: Total Help needed moving from lying on your back to sitting on the side of a flat bed without using bedrails?: Total Help needed moving to and from a bed to  a chair (including a wheelchair)?: Total Help needed standing up from a chair using your arms (e.g., wheelchair or  bedside chair)?: Total Help needed to walk in hospital room?: Total Help needed climbing 3-5 steps with a railing? : Total 6 Click Score: 6    End of Session Equipment Utilized During Treatment: Oxygen Activity Tolerance: Patient limited by fatigue Patient left: in bed;with call bell/phone within reach;with bed alarm set Nurse Communication: Mobility status;Need for lift equipment PT Visit Diagnosis: Muscle weakness (generalized) (M62.81);Other abnormalities of gait and mobility (R26.89);Difficulty in walking, not elsewhere classified (R26.2)    Time: 3967-2897 PT Time Calculation (min) (ACUTE ONLY): 23 min   Charges:   PT Evaluation $PT Re-evaluation: 1 Re-eval PT Treatments $Therapeutic Activity: 8-22 mins        [place] Carbo, PT, DPT   Acute Rehabilitation Department  Sandra Cockayne 01/29/2022, 11:36 AM

## 2022-01-29 NOTE — Hospital Course (Addendum)
Mr. Katzenstein was admitted to the hospital with the working diagnosis of acute hypoxemic respiratory failure due to community acquired pneumonia.   88 yo male with the past medical history of ESRD on HD, who was brought to the hospital after finding him unresponsive by his family. 24 hr prior patient had fatigue and was feeling weak. In the ED patient as intubated. His blood pressure 138/59, HR 60, RR 16 and 02 saturation 100% on mechanical ventilation. His lungs had rhonchi but not wheezing, heart with S1 and S2 present and rhythmic, no gallops, abdomen with no distention and no lower extremity edema.   Chest radiograph with cardiomegaly, positive patchy infiltrate at the right middle lobe, pacemaker in place with on atrial and one ventricular lead.   Chest CT with bilateral faint ground glass opacities, right middle lobe infiltrates, left lower lobe infiltrate and small bilateral pleural effusions.   EKG 63 bpm, left axis deviation, left bundle branch block, atrial and ventricular paced rhythm, with no significant ST segment or T wave changes, positive LVH.   ETT 10/17-10/18 10/20 transferred out of ICU 10/21 PCCM reconsulted for complete whiteout of left lung, transferred to ICU and intubated, brief PEA arrest with ROSC in  4 minutes I/O placed 10/22 CT Head: Small acute infarct in the inferior left cerebellum 10/24 MRI brain: Acute infarct of the left and right cerebellum, right pons as well as multiple scattered infarcts in both cerebral hemisphere 10/24 MRA head: Severe stenosis of L ICA, moderate stenosis of R ICA and moderate stenosis of distal right vertebral artery 10/25 CXR worsening lower lung atelectasis/infiltrate 10/25 bronchoscopy w/ BAL 10/26 extubated to nasal cannula 10/29 became hypoxic and went to PEA cardiac arrest, reintubated and transferred to ICU  11/01 extubated, change code status to DNR.  11/03 transfer to [facility].  11/04 patient not able to cooperate with speech and  swallow evaluation due to not able to follow commands.  Patient transported to HD, he developed bradycardia and cardiac arrest before completing HD.  His daughter was informed and all questions were addressed.

## 2022-01-29 NOTE — Assessment & Plan Note (Signed)
Patient with PEA cardiac arrests due to hypoxemia. Echocardiogram with reduced LV systolic function with EF 45 to 50%, with global hypokinesis.  RV systolic function with moderate reduction, RVSP 45.9  Severe dilatation of LA and RA. Mild to moderate TR.   Systolic blood pressure 947 to 130 mmHg Continue with carvedilol Fluid management per HD

## 2022-01-29 NOTE — Progress Notes (Signed)
Received telephone call from central telemetry with information that pt O2 sats 78. On assessment, pt lying in bed diaphoretic and sats 77-78% o 6L [place]. Rapid response called and assessment done per rapid response nurse who advised to place NRB and to shut tube feeding off.  Rapid response nurse called on call provider to come see patient. On call provider arrived and new orders given to be carried out. On call provider called pt daughter to inform her of patient sudden decline. Will continue to monitor patient and keep NRB in place and keep tube feeding to remain off.

## 2022-01-29 NOTE — IPAL (Signed)
I talked to patient's daughter at the bedside, she is the care giver and makes medical decisions for her father.  We talked about Jeffrey Campbell condition of respiratory failure, renal failure, and not able to eat by mouth due to aspiration risk.  She understands he is at high risk for recurrent respiratory and cardiac arrest and confirms patient is DNR and DNI.  Plan to continue medical care with tube feedings and medications per tube.  Continue renal replacement therapy. In case of distress will use as needed hydromorphone to relieve his symptoms and prevent suffering.   Will update regularly patient's family about patient's condition and evolution under current plan.

## 2022-01-29 NOTE — Progress Notes (Addendum)
PCCM INTERVAL PROGRESS NOTE   Called to bedside due to patient in respiratory distress. Chart reviewed. Briefly this is an 88 year old male with hx advanced dementia and ESRD. Now admitted for multifocal pneumonia complicated by cardiac arrest x 2. The patient was extubated 11/1, however, respiratory status was tenuous and the decision was made between Dr. Tacy Learn and family not to pursue re-intubation per IPAL note dated 11/1.   Per RN report tonight. The patient has had tachypnea since change of shift. He had sudden onset of hypoxia at around 1245. Rapid response was called and I was alerted by Rapid response RN. Upon my arrival the patient is requiring 100% non-rebreather facemask to maintain O2 sats in the mid 80s and is having significant respiratory muscle use. This is refractory to naso-tracheal suctioning. I worry he has aspirated further or has a mucous plug, with which he has struggled this entire admission. He had HD less tahn 12 hours ago and has 1559m removed so this is unlikely to be volume related. I have called the patient's daughter Jeffrey Norlanderand updated her. Within his, and his family's, wishes we have agreed to focus on his comfort and give morphine for dyspnea.    PGeorgann Campbell AGACNP-BC [facility]  See Amion for personal pager PCCM on call pager ([phone] 7pm. Please call Elink 7p-7a. [phone] 01/29/2022 1:38 AM

## 2022-01-29 NOTE — Plan of Care (Signed)
Palliative consulted today. Pt scheduled for dialysis tomorrow. Tube feeds restarted today at 1320.  Problem: Nutrition: Goal: Adequate nutrition will be maintained Outcome: Progressing   Problem: Coping: Goal: Level of anxiety will decrease Outcome: Progressing   Problem: Elimination: Goal: Will not experience complications related to bowel motility Outcome: Progressing   Problem: Pain Managment: Goal: General experience of comfort will improve Outcome: Progressing   Problem: Safety: Goal: Ability to remain free from injury will improve Outcome: Progressing   Problem: Skin Integrity: Goal: Risk for impaired skin integrity will decrease Outcome: Progressing   Problem: Health Behavior/Discharge Planning: Goal: Goals will be collaboratively established with patient/family Outcome: Progressing   Problem: Nutrition: Goal: Risk of aspiration will decrease Outcome: Progressing Goal: Dietary intake will improve Outcome: Progressing   Problem: Self-Care: Goal: Ability to participate in self-care as condition permits will improve Outcome: Not Progressing Goal: Verbalization of feelings and concerns over difficulty with self-care will improve Outcome: Not Progressing Goal: Ability to communicate needs accurately will improve Outcome: Not Progressing

## 2022-01-29 NOTE — Progress Notes (Signed)
Nutrition Follow-up  DOCUMENTATION CODES:   Severe malnutrition in context of chronic illness  INTERVENTION:  Continue tube feeding via Cortrak: Peptamen 1.5 @ 12m/hr with goal to advance back to to goal rate of 50 ml/hr PROSource TF20 60 ml daily Tube feeding regimen at goal rate provides 1880 kcal, 102 grams of protein, and 923 ml of H2O.  Banatrol TF 60 ml BID per tube  NUTRITION DIAGNOSIS:   Severe Malnutrition related to chronic illness (ESRD on HD, COPD) as evidenced by severe fat depletion, moderate muscle depletion, severe muscle depletion.  Ongoing  GOAL:   Patient will meet greater than or equal to 90% of their needs  Goal unmet as TF rate decreased  MONITOR:   Diet advancement, I & O's, TF tolerance, Weight trends, Labs  REASON FOR ASSESSMENT:   Ventilator, Consult Enteral/tube feeding initiation and management  ASSESSMENT:   88year old male with PMHx of peptic ulcer disease, HTN, HLD, CAD, ESRD on HD MWF, COPD, dementia, hypothyroidism, DM who presented after being found unresponsive at home with septic shock and acute hypoxic respiratory failure secondary to aspiration PNA, acute metabolic encephalopathy. Intubated 01/16/2022.  10/17 - intubated in ED 10/18 - extubated 10/20 - Cortrak placed (tip gastric) 10/21 - PEA arrest, intubated 10/25 - bronchoscopy (thick secretions bilaterally) 10/26 - extubated 10/29 - cardiac arrest, re-intubated  11/01- extubated  Pt experienced respiratory distress this morning and tube feeding was held at that time.   Per MD note today, plan to continue with conservative care with gentle tube feeding rate at 30 ml/hr with aspiration precautions.   Plans for next HD on Saturday.   Medications: pepcid, banatrol TF BID, SSI 0-15 units q4h, SSI 2 units q4h, synthroid  Labs: BUN 63, Cr 4.02, Phos 5.1, GFR 14, CBG's 129-166 x24 hours  Diet Order:   Diet Order             Diet NPO time specified  Diet effective now                    EDUCATION NEEDS:   Not appropriate for education at this time  Skin:  Skin Assessment: Skin Integrity Issues: Skin Integrity Issues:: Stage I Stage I: sacrum  Last BM:  11/3 (type 7-small)  Height:   Ht Readings from Last 1 Encounters:  01/19/22 '5\' 9"'$  (1.753 m)    Weight:   Wt Readings from Last 1 Encounters:  01/29/22 55.2 kg    Ideal Body Weight:  72.7 kg  BMI:  Body mass index is 17.97 kg/m.  Estimated Nutritional Needs:   Kcal:  [address] Protein:  90-100 grams  Fluid:  UOP + 1 L  AClayborne Dana RDN, LDN Clinical Nutrition

## 2022-01-30 DIAGNOSIS — J9601 Acute respiratory failure with hypoxia: Secondary | ICD-10-CM | POA: Diagnosis not present

## 2022-01-30 DIAGNOSIS — N186 End stage renal disease: Secondary | ICD-10-CM | POA: Diagnosis not present

## 2022-01-30 DIAGNOSIS — I5021 Acute systolic (congestive) heart failure: Secondary | ICD-10-CM | POA: Diagnosis not present

## 2022-01-30 DIAGNOSIS — G9340 Encephalopathy, unspecified: Secondary | ICD-10-CM | POA: Diagnosis not present

## 2022-01-30 LAB — GLUCOSE, CAPILLARY
Glucose-Capillary: 115 mg/dL — ABNORMAL HIGH (ref 70–99)
Glucose-Capillary: 123 mg/dL — ABNORMAL HIGH (ref 70–99)
Glucose-Capillary: 134 mg/dL — ABNORMAL HIGH (ref 70–99)
Glucose-Capillary: 138 mg/dL — ABNORMAL HIGH (ref 70–99)

## 2022-01-30 LAB — RENAL FUNCTION PANEL
Albumin: 2.7 g/dL — ABNORMAL LOW (ref 3.5–5.0)
Anion gap: 17 — ABNORMAL HIGH (ref 5–15)
BUN: 89 mg/dL — ABNORMAL HIGH (ref 8–23)
CO2: 25 mmol/L (ref 22–32)
Calcium: 10.5 mg/dL — ABNORMAL HIGH (ref 8.9–10.3)
Chloride: 96 mmol/L — ABNORMAL LOW (ref 98–111)
Creatinine, Ser: 5.4 mg/dL — ABNORMAL HIGH (ref 0.61–1.24)
GFR, Estimated: 10 mL/min — ABNORMAL LOW (ref >60)
Glucose, Bld: 142 mg/dL — ABNORMAL HIGH (ref 70–99)
Phosphorus: 6.7 mg/dL — ABNORMAL HIGH (ref 2.5–4.6)
Potassium: 5.7 mmol/L — ABNORMAL HIGH (ref 3.5–5.1)
Sodium: 138 mmol/L (ref 135–145)

## 2022-02-04 MED FILL — Medication: Qty: 1 | Status: AC

## 2022-02-16 LAB — FUNGUS CULTURE RESULT

## 2022-02-16 LAB — FUNGAL ORGANISM REFLEX

## 2022-02-16 LAB — FUNGUS CULTURE WITH STAIN

## 2022-02-26 NOTE — Progress Notes (Signed)
I spoke with Jeffrey Campbell daughter Jeffrey Campbell, I explained her that Jeffrey Campbell passed away while having hemodialysis. He was not in any pain or distress when this occurred. She expressed understanding of the situation. All questions were addressed.

## 2022-02-26 NOTE — Progress Notes (Signed)
Nephrology Progress Note:  Subjective:   Pt seen in room. His sister is at bedside. Pt demented and does not interact verbally.   Objective Vital signs in last 24 hours: Vitals:   2022/02/14 0406 2022-02-14 0652 2022-02-14 0751 2022-02-14 1126  BP: 119/63  118/63 105/62  Pulse: 67  69 74  Resp: (!) '25  18 18  '$ Temp: 98.8 F (37.1 C)  98.8 F (37.1 C) 98.7 F (37.1 C)  TempSrc: Axillary  Axillary Oral  SpO2: 100%  100% 100%  Weight:  56.1 kg    Height:       Weight change: 0.9 kg  Intake/Output Summary (Last 24 hours) at 02-14-2022 1156 Last data filed at 01/29/2022 1926 Gross per 24 hour  Intake 493 ml  Output --  Net 493 ml    Physical Exam:       alert, elderly male, mild ^wob  no jvd  Chest cta bilat, +rhonchi ant  Cor reg no RG  Abd soft ntnd no ascites   Ext no LE edema   Alert, not responding verbally  LUE AVF +bruit    Dialysis Orders:  MWF [facility]  4h    400/1.5   54.5kg  2/2 bath   LUE AVF  Heparin 3000 Calcitriol 0.5 mcg PO qHD Sensipar '120mg'$  qHD     Assessment/Plan: AMS -  2/2 bacteremia and PNA. S/p EEG with no seizure activity. Also hx of recurrent PEA arrest, and hx of dementia.   Acute respiratory failure - recurrent issue. Was intubated initially w/ concern for aspiration on CT.  Extubated 10/18 however required reintubation for cardiac arrest on 10/21.  Extubated 10/26.  S/p bronch with thick secretions.  DC'd to floor 10/29 then had another PEA arrest 10/30 and was back on vent in ICU. Extubated 11/2. Now pt is DNR/ DNI. Bacteremia - BC +streptococcus pneumoniae. SP course of abx ESRD -  usual HD MWF, getting TTS HD this week. HD today off schedule, then next HD on Monday.  BP/ volume- euvolemic on exam, 1kg under dry wt, no edema, BP's normal. Keep even today.   Anemia of CKD - off of ESA now in setting of acute stroke.  No iron in setting of acute infection. (Note he did get aranesp 10/20 prior to that) Secondary Hyperparathyroidism - paused calcitriol with  hypercalcemia.  No binders now. Limited to baths available inpatient  Acute CVA - MRI with multiple infarcts c/w shower of microemboli.  Per primary team and neuro  CAD, CHB s/p PPM DMT2 - per primary team  COPD   Kelly Splinter, MD 02/14/22, 11:56 AM  Recent Labs  Lab 01/28/22 0407 01/29/22 0035 2022/02/14 0044  HGB 10.7* 9.9*  --   ALBUMIN 2.6* 2.9* 2.7*  CALCIUM 10.0 10.2 10.5*  PHOS 5.9* 5.1* 6.7*  CREATININE 6.00* 4.02* 5.40*  K 5.6* 4.9 5.7*     Inpatient medications:   stroke: early stages of recovery book   Does not apply Once   aspirin  81 mg Per Tube Daily   atorvastatin  20 mg Per Tube Daily   brinzolamide  1 drop Both Eyes BID   And   brimonidine  1 drop Both Eyes BID   carvedilol  3.125 mg Per Tube BID WC   Chlorhexidine Gluconate Cloth  6 each Topical Q0600   donepezil  10 mg Per Tube QHS   famotidine  20 mg Per Tube Daily   feeding supplement (PROSource TF20)  60 mL Per Tube Daily  fiber supplement (BANATROL TF)  60 mL Per Tube BID   heparin injection (subcutaneous)  5,000 Units Subcutaneous Q12H   insulin aspart  0-15 Units Subcutaneous Q4H   insulin aspart  2 Units Subcutaneous Q4H   levothyroxine  150 mcg Per Tube Q0600   memantine  10 mg Per Tube BID   mouth rinse  15 mL Mouth Rinse Q2H   mouth rinse  15 mL Mouth Rinse 4 times per day    sodium chloride Stopped (01/23/22 1921)   feeding supplement (PEPTAMEN 1.5 CAL) 30 mL/hr at 01/29/22 1926   sodium chloride     sodium chloride     sodium chloride, acetaminophen (TYLENOL) oral liquid 160 mg/5 mL, albuterol, heparin sodium (porcine), HYDROmorphone (DILAUDID) injection, mouth rinse, mouth rinse, sodium chloride

## 2022-02-26 NOTE — Progress Notes (Signed)
Pt became apneic towards the tail end of HD w/ 6 min left.  HD was dc'd but couldn't return the blood in the lines. Pt examined and had no respirations, no pulse, no signs of life. Exam confirmed by Enos Fling w/ rapid response and Yehuda Savannah, HD RN. Telemetry is showing persistent electrical activity related to his pacemaker. Pt has passed away, time of death 5:02 pm.    Kelly Splinter, MD 2022-02-01, 5:12 PM

## 2022-02-26 NOTE — Progress Notes (Signed)
SLP Cancellation Note  Patient Details Name: Jeffrey Campbell MRN: [id number] DOB: 23-May-1932   Cancelled swallowing evaluation:       Reason Eval/Treat Not Completed: Medical issues which prohibited therapy- Pt on NRB, desaturating without; not following commands per RN. SLP will f/u next date to determine appropriateness for eval.  Leilany Digeronimo L. Tivis Ringer, MA CCC/SLP Clinical Specialist - Acute Care SLP Acute Rehabilitation Services Office number [phone]    Juan Quam Laurice February 14, 2022, 11:21 AM

## 2022-02-26 NOTE — Progress Notes (Addendum)
Received patient in bed to unit.  Alert and oriented.  Informed consent signed and in chart.   Treatment initiated: 1358 Treatment completed: 6295  Patient tolerated well.  Transported back to the room  Alert, without acute distress.  Hand-off given to patient's nurse.   Access used: Fistula Access issues: none  Total UF removed: 0 Medication(s) given: none Post HD VS: pt deceased.  Post HD weight: n/a   Pt passed away at 1702 pronounced by Dr.Schertz. Pt started to decline with 6 minutes left on treatment. I ended treatment and my charge nurse Tory, RN called rapid. Once rapid arrived pt had already stopped breathing. Pt was DNR. See Rapid responses notes for detail on their behalf. Called report and transported the pt back to their room with Rapid response. Managers were made aware of situation. Machine was removed and tagged as well as safety zone placed.   [facility]

## 2022-02-26 NOTE — Significant Event (Signed)
Rapid Response Event Note   Reason for Call :  I was called to HD because the RN believed that the patient was actively passing. They noted that the patient's respirations initially increased, then slowed down. When I got to the HD unit, he had become apneic. He did not appear to be in any distress and passed peacefully. Patient pronounced by myself and fellow HD nurse. Dr. Jonnie Finner came bedside and Dr. Cathlean Sauer was updated. MD informed family. Patient was transported back to [facility].    Event Summary:   MD Notified: Dr. Jonnie Finner and Dr. Cathlean Sauer Call Time: 3335 Arrival Time: 4562 End Time: Mazie, RN

## 2022-02-26 NOTE — Progress Notes (Signed)
Progress Note   Patient: Jeffrey Campbell:[id number] DOB: 12/12/1932 DOA: 01/23/2022     18 DOS: the patient was seen and examined on 01-Feb-2022   Brief hospital course: Mr. Baldree was admitted to the hospital with the working diagnosis of acute hypoxemic respiratory failure due to community acquired pneumonia.   87 yo male with the past medical history of ESRD on HD, who was brought to the hospital after finding him unresponsive by his family. 24 hr prior patient had fatigue and was feeling weak. In the ED patient as intubated. His blood pressure 138/59, HR 60, RR 16 and 02 saturation 100% on mechanical ventilation. His lungs had rhonchi but not wheezing, heart with S1 and S2 present and rhythmic, no gallops, abdomen with no distention and no lower extremity edema.   Chest radiograph with cardiomegaly, positive patchy infiltrate at the right middle lobe, pacemaker in place with on atrial and one ventricular lead.   Chest CT with bilateral faint ground glass opacities, right middle lobe infiltrates, left lower lobe infiltrate and small bilateral pleural effusions.   EKG 63 bpm, left axis deviation, left bundle branch block, atrial and ventricular paced rhythm, with no significant ST segment or T wave changes, positive LVH.   ETT 10/17-10/18 10/20 transferred out of ICU 10/21 PCCM reconsulted for complete whiteout of left lung, transferred to ICU and intubated, brief PEA arrest with ROSC in  4 minutes I/O placed 10/22 CT Head: Small acute infarct in the inferior left cerebellum 10/24 MRI brain: Acute infarct of the left and right cerebellum, right pons as well as multiple scattered infarcts in both cerebral hemisphere 10/24 MRA head: Severe stenosis of L ICA, moderate stenosis of R ICA and moderate stenosis of distal right vertebral artery 10/25 CXR worsening lower lung atelectasis/infiltrate 10/25 bronchoscopy w/ BAL 10/26 extubated to nasal cannula 10/29 became hypoxic and went to PEA  cardiac arrest, reintubated and transferred to ICU  11/01 extubated, change code status to DNR.  11/03 transfer to [facility].  11/04 patient not able to cooperate with speech and swallow evaluation due to not able to follow commands.   Assessment and Plan: * Encephalopathy Acute metabolic encephalopathy  Patient with dementia. Continue to be none verbal, and not following commands Continue namenda and close neuro checks per unit protocol Aspiration precautions.   Not able to perform swallow evaluation due to neurologic status, continue with tube feeding per cortrack.  Patient will not be a good candidate for PEG tube due to encephalopathy, and recurrent aspiration.  Poor prognosis, will continue to update his daughter.  Patient's daughter has been verbally abusive to palliative care team on prior encounters in the care of her mother. For now will hold on palliative care consultation.   Acute respiratory failure with hypoxia (HCC) Aspiration (pneumococcal) pneumonia (present on admission), right middle lobe and left lower lobe. Positive mucous plugging.   Patient with prolonged hospitalization with multiple re intubations, he is very weak and deconditioned. Extubated on 11/01, and change code status to DNR/ DNI.  Completed antibiotic therapy.   Patient continue very weak and deconditioned he has been on 100% Fi02 per non re breather.   Plan to continue with conservative care with gentle tube feedings rate at 30 ml per hr with aspiration precautions.  Continue supplemental 02 per to keep 02 saturation 92% or greater, trial for Gravette.  Continue with as needed hydromorphone for severe dyspnea (patient ESRD will try to avoid morphine).    ESRD (end stage renal  disease) [facility]) Patient very weak and deconditioned, poor prognosis in the setting of encephalopathy, CVA, dementia and multiple episodes of cardiac arrest.  Patient's family would like to continue with renal replacement therapy. Plan  for HD today per nephrology recommendations.   Acute systolic heart failure [facility]) Patient with PEA cardiac arrests due to hypoxemia. Echocardiogram with reduced LV systolic function with EF 45 to 50%, with global hypokinesis.  RV systolic function with moderate reduction, RVSP 45.9  Severe dilatation of LA and RA. Mild to moderate TR.   Systolic blood pressure 480 to 130 mmHg Continue with carvedilol Fluid management per HD  Persistent atrial fibrillation (HCC) Continue rate control with carvedilol.  Patient is not on anticoagulation due to dementia, fall risk and generalized weakness, (decision made prior to admission per his medical team).   Protein-calorie malnutrition, severe Continue nutritional supplements and tube feedings with aspiration precautions.   CVA (cerebral vascular accident) [facility]) Brain MRI with acute infarcts, left and right cerebellum, right pons as wall as multiple scattered infarcts in both cerebral hemispheres.  Severe stenosis of L ICA, moderate stenosis R ICA.   Continue medical therapy, patient with poor prognosis.  Neurology has recommended to continue with aspirin, patient is high risk for anticoagulation due to  dementia, fall risk and generalized weakness.     Subjective: Patient not verbal and not following commands. Tube feedings have resumed but patient not able to cooperate with formal swallow evaluation due to encephalopathy   Physical Exam: Vitals:   Feb 17, 2022 0406 February 17, 2022 0652 2022-02-17 0751 2022/02/17 1126  BP: 119/63  118/63 105/62  Pulse: 67  69 74  Resp: (!) _0 Temp: 98.8 F (37.1 C)  98.8 F (37.1 C) 98.7 F (37.1 C)  TempSrc: Axillary  Axillary Oral  SpO2: 100%  100% 100%  Weight:  56.1 kg    Height:       Neurology somnolent, opens eyes to touch, not following commands or answering simple questions ENT with positive pallor Cardiovascular with S1 and S2 present and regular Respiratory with no rales or wheezing Abdomen  with no distention  No lower extremity edema  Data Reviewed:   Family Communication: no family at the bedside. I spoke with his daughter over the phone, give her an update and all questions were addressed.   Disposition: Status is: Inpatient Remains inpatient appropriate because: rapid decline in health   Planned Discharge Destination: Home    Author: Tawni Millers, MD 02/17/2022 11:55 AM  For on call review [email or web address].

## 2022-02-26 NOTE — Death Summary Note (Signed)
DEATH SUMMARY   Patient Details  Name: MARSHAUN LORTIE MRN: [id number] DOB: Jul 11, 1932 WUJ:[id number], Dwyane Luo, MD Admission/Discharge Information   Admit Date:  01/17/2022  Date of Death: Date of Death: 02/04/2022  Time of Death: Time of Death: 1700-05-13  Length of Stay: May 21, 2022   Principle Cause of death: pneumonia with hypoxemic respiratory failure.  Recurrent aspiration pneumonia.   Hospital Diagnoses: Principal Problem:   Encephalopathy Active Problems:   Acute respiratory failure with hypoxia (HCC)   ESRD (end stage renal disease) (HCC)   Acute systolic heart failure (HCC)   Persistent atrial fibrillation (HCC)   Protein-calorie malnutrition, severe   CVA (cerebral vascular accident) [facility])   Hospital Course: Mr. Wyndham was admitted to the hospital with the working diagnosis of acute hypoxemic respiratory failure due to community acquired pneumonia.   88 yo male with the past medical history of ESRD on HD, who was brought to the hospital after finding him unresponsive by his family. 24 hr prior patient had fatigue and was feeling weak. In the ED patient as intubated. His blood pressure 138/59, HR 60, RR 16 and 02 saturation 100% on mechanical ventilation. His lungs had rhonchi but not wheezing, heart with S1 and S2 present and rhythmic, no gallops, abdomen with no distention and no lower extremity edema.   Chest radiograph with cardiomegaly, positive patchy infiltrate at the right middle lobe, pacemaker in place with on atrial and one ventricular lead.   Chest CT with bilateral faint ground glass opacities, right middle lobe infiltrates, left lower lobe infiltrate and small bilateral pleural effusions.   EKG 63 bpm, left axis deviation, left bundle branch block, atrial and ventricular paced rhythm, with no significant ST segment or T wave changes, positive LVH.   ETT 2009/01/17/18 10/20 transferred out of ICU 10/21 PCCM reconsulted for complete whiteout of left lung, transferred to  ICU and intubated, brief PEA arrest with ROSC in  4 minutes I/O placed 10/22 CT Head: Small acute infarct in the inferior left cerebellum 10/24 MRI brain: Acute infarct of the left and right cerebellum, right pons as well as multiple scattered infarcts in both cerebral hemisphere 10/24 MRA head: Severe stenosis of L ICA, moderate stenosis of R ICA and moderate stenosis of distal right vertebral artery 10/25 CXR worsening lower lung atelectasis/infiltrate 10/25 bronchoscopy w/ BAL 10/26 extubated to nasal cannula 10/29 became hypoxic and went to PEA cardiac arrest, reintubated and transferred to ICU  11/01 extubated, change code status to DNR.  11/03 transfer to [facility].  02/05/2023 patient not able to cooperate with speech and swallow evaluation due to not able to follow commands.   Assessment and Plan: * Encephalopathy Acute metabolic encephalopathy  Patient with dementia. Continue to be none verbal, and not following commands Continue namenda and close neuro checks per unit protocol Aspiration precautions.   Not able to perform swallow evaluation due to neurologic status, continue with tube feeding per [place].  Patient will not be a good candidate for PEG tube due to encephalopathy, and recurrent aspiration.  Poor prognosis, will continue to update his daughter.  Patient's daughter has been verbally abusive to palliative care team on prior encounters in the care of her mother. For now will hold on palliative care consultation.   Acute respiratory failure with hypoxia (HCC) Aspiration (pneumococcal) pneumonia (present on admission), right middle lobe and left lower lobe. Positive mucous plugging.   Patient with prolonged hospitalization with multiple re intubations, he is very weak and deconditioned. Extubated on 11/01,  and change code status to DNR/ DNI.  Completed antibiotic therapy.   Patient continue very weak and deconditioned he has been on 100% Fi02 per non re breather.   Plan to  continue with conservative care with gentle tube feedings rate at 30 ml per hr with aspiration precautions.  Continue supplemental 02 per to keep 02 saturation 92% or greater, trial for [place].  Continue with as needed hydromorphone for severe dyspnea (patient ESRD will try to avoid morphine).    ESRD (end stage renal disease) ([place]) Patient very weak and deconditioned, poor prognosis in the setting of encephalopathy, CVA, dementia and multiple episodes of cardiac arrest.  Patient's family would like to continue with renal replacement therapy. Plan for HD today per nephrology recommendations.   Acute systolic heart failure [facility]) Patient with PEA cardiac arrests due to hypoxemia. Echocardiogram with reduced LV systolic function with EF 45 to 50%, with global hypokinesis.  RV systolic function with moderate reduction, RVSP 45.9  Severe dilatation of LA and RA. Mild to moderate TR.   Systolic blood pressure 308 to 130 mmHg Continue with carvedilol Fluid management per HD  Persistent atrial fibrillation (HCC) Continue rate control with carvedilol.  Patient is not on anticoagulation due to dementia, fall risk and generalized weakness, (decision made prior to admission per his medical team).   Protein-calorie malnutrition, severe Continue nutritional supplements and tube feedings with aspiration precautions.   CVA (cerebral vascular accident) [facility]) Brain MRI with acute infarcts, left and right cerebellum, right pons as wall as multiple scattered infarcts in both cerebral hemispheres.  Severe stenosis of L ICA, moderate stenosis R ICA.   Continue medical therapy, patient with poor prognosis.  Neurology has recommended to continue with aspirin, patient is high risk for anticoagulation due to  dementia, fall risk and generalized weakness.           The results of significant diagnostics from this hospitalization (including imaging, microbiology, ancillary and laboratory) are listed below  for reference.   Significant Diagnostic Studies: DG CHEST PORT 1 VIEW  Result Date: 01/25/2022 CLINICAL DATA:  88 year old male intubated. Pneumonia, pleural effusion. EXAM: PORTABLE CHEST 1 VIEW COMPARISON:  Portable chest 01/24/2022 and earlier. FINDINGS: Portable AP semi upright view at 0512 hours. Intubated, endotracheal tube tip in good position between the clavicles and carina. Enteric feeding tube courses to the abdomen, tip not included. Stable right chest dual cardiac pacemaker. Resolved white out of the left lung since yesterday, substantially improved left lung ventilation with residual lower lobe collapse or consolidation. A small right pleural effusion appears increased from chest CT 01/18/2022 but stable or decreased since yesterday. Residual right middle and lower lung airspace opacity, but ventilation improved in the right lung since yesterday. Stable cardiac size and mediastinal contours. Calcified aortic atherosclerosis. IMPRESSION: 1. Satisfactory ET tube and enteric feeding tube. 2. Subtotal resolution of left lung collapse since yesterday. Residual lower lobe atelectasis. 3. Improved right lung ventilation also with residual pleural effusion and multifocal airspace opacity which may be residual pneumonia. Electronically Signed   By: Genevie Ann M.D.   On: 01/25/2022 08:29   DG CHEST PORT 1 VIEW  Result Date: 01/24/2022 CLINICAL DATA:  Pneumonia EXAM: PORTABLE CHEST 1 VIEW COMPARISON:  01/21/2022 FINDINGS: Dual lead pacer noted.  Feeding tube enters the stomach. The patient is rotated to the left on today's radiograph, reducing diagnostic sensitivity and specificity. Interval complete opacification of the left hemithorax, with some truncation of the left mainstem bronchus, cannot exclude mucous plugging.  On the prior exam the airspace opacity was confined to the left lower lobe and prior left pleural effusion was moderate on 01/23/2022. There is an at least moderate right pleural effusion  with blunting of the right costophrenic angle in indistinct hazy opacity at the right lung base likely representing pleural fluid and atelectasis, pneumonia is not excluded. Atherosclerotic calcification of the thoracic aorta. IMPRESSION: 1. Interval complete opacification of the left hemithorax. 2. At least moderate right pleural effusion with atelectasis or pneumonia at the right lung base. 3. Atherosclerotic calcification of the thoracic aorta. 4. Dual lead pacer noted. 5. Feeding tube enters the stomach. Electronically Signed   By: Van Clines M.D.   On: 01/24/2022 15:42   DG CHEST PORT 1 VIEW  Result Date: 01/21/2022 CLINICAL DATA:  Aspiration pneumonia.  Intubated patient. EXAM: PORTABLE CHEST 1 VIEW COMPARISON:  Radiographs 01/20/2022, 01/19/2022 and 01/17/2022. CT 12/27/2021. FINDINGS: 0800 hours. The positions of the endotracheal tube, feeding tube and right subclavian pacemaker leads have not significantly changed. The tip of the feeding tube is not visualized. The heart remains enlarged. There is aortic atherosclerosis. Bibasilar airspace opacities and small bilateral pleural effusions are unchanged. Possible increased density in the right perihilar region which could be due to overlying support apparatus. No evidence of pneumothorax or acute osseous abnormality. IMPRESSION: No significant change in bibasilar airspace opacities and small bilateral pleural effusions. Possible increased density in the right perihilar region which could be due to overlying support apparatus. Electronically Signed   By: Richardean Sale M.D.   On: 01/21/2022 08:41   DG Chest Port 1 View  Result Date: 01/20/2022 CLINICAL DATA:  Intubation.  Respiratory failure. EXAM: PORTABLE CHEST 1 VIEW COMPARISON:  01/19/2022 FINDINGS: Endotracheal tube tip is 3 cm above the carina. Soft feeding tube enters the abdomen. Dual lead pacemaker remains in place. Worsening of bilateral effusions and lower lung  atelectasis/infiltrate. Upper lobes remain largely clear. IMPRESSION: Endotracheal tube tip 3 cm above the carina. Worsening of bilateral effusions and lower lung atelectasis/infiltrate. Electronically Signed   By: Nelson Chimes M.D.   On: 01/20/2022 08:41   VAS US CAROTID (at [facility] and [facility] only)  Result Date: 01/20/2022 Carotid Arterial Duplex Study Patient Name:  [name]  Date of Exam:   01/19/2022 Medical Rec #: [id number]      Accession #:    [id number] Date of Birth: [birth year]      Patient Gender: M Patient Age:   31 years Exam Location:  [facility] Procedure:      VAS US CAROTID Referring Phys: Beulah Gandy --------------------------------------------------------------------------------  Indications:       CVA. Risk Factors:      Hypertension, hyperlipidemia, past history of smoking,                    coronary artery disease. Comparison Study:  05/11/2019 carotid artery duplex- bilateral 1-39% carotid                    artery stenosis Performing Technologist: Maudry Mayhew MHA, RDMS, RVT, RDCS  Examination Guidelines: A complete evaluation includes B-mode imaging, spectral Doppler, color Doppler, and power Doppler as needed of all accessible portions of each vessel. Bilateral testing is considered an integral part of a complete examination. Limited examinations for reoccurring indications may be performed as noted.  Right Carotid Findings: +----------+--------+--------+--------+---------------------+------------------+           PSV cm/sEDV cm/sStenosisPlaque Description   Comments           +----------+--------+--------+--------+---------------------+------------------+  CCA Prox  82      11              irregular, focal and                                                      heterogenous                            +----------+--------+--------+--------+---------------------+------------------+ CCA Distal60      8                                    intimal  thickening +----------+--------+--------+--------+---------------------+------------------+ ICA Prox  42      11              heterogenous and     Shadowing                                            calcific                                +----------+--------+--------+--------+---------------------+------------------+ ICA Distal52      14                                                      +----------+--------+--------+--------+---------------------+------------------+ ECA       66                      heterogenous,        shadowing                                            irregular and                                                             calcific                                +----------+--------+--------+--------+---------------------+------------------+ +----------+--------+-------+----------------+-------------------+           PSV cm/sEDV cmsDescribe        Arm Pressure (mmHG) +----------+--------+-------+----------------+-------------------+ [id number]            Multiphasic, WNL                    +----------+--------+-------+----------------+-------------------+ +---------+--------+--+--------+-+---------+ VertebralPSV cm/s40EDV cm/s7Antegrade +---------+--------+--+--------+-+---------+  Left Carotid Findings: +----------+--------+--------+--------+--------------------------+---------+           PSV cm/sEDV cm/sStenosisPlaque Description        Comments  +----------+--------+--------+--------+--------------------------+---------+ CCA Prox  83                                                          +----------+--------+--------+--------+--------------------------+---------+  CCA Distal76      7                                                   +----------+--------+--------+--------+--------------------------+---------+ ICA Prox  53      11              heterogenous and calcific Shadowing  +----------+--------+--------+--------+--------------------------+---------+ ICA Distal50      11                                                  +----------+--------+--------+--------+--------------------------+---------+ ECA       74                      irregular and heterogenous          +----------+--------+--------+--------+--------------------------+---------+ +----------+--------+--------+----------------+-------------------+           PSV cm/sEDV cm/sDescribe        Arm Pressure (mmHG) +----------+--------+--------+----------------+-------------------+ Subclavian209             Multiphasic, WNL                    +----------+--------+--------+----------------+-------------------+ +---------+--------+--+--------+-+----------------------+ VertebralPSV cm/s31EDV cm/s6Monophasic, AVF in LUE +---------+--------+--+--------+-+----------------------+   Summary: Right Carotid: Velocities in the right ICA are consistent with a 1-39% stenosis. Left Carotid: Velocities in the left ICA are consistent with a 1-39% stenosis. Vertebrals:  Bilateral vertebral arteries demonstrate antegrade flow. Subclavians: Normal flow hemodynamics were seen in the right subclavian artery.              Left subclavian artery exhibits monophasic flow; AVF in left upper              extremity. *See table(s) above for measurements and observations.  Electronically signed by Deitra Mayo MD on 01/20/2022 at 6:17:44 AM.    Final    MR BRAIN WO CONTRAST  Result Date: 01/19/2022 CLINICAL DATA:  Left inferior cerebellar stroke shown by CT EXAM: MRI HEAD WITHOUT CONTRAST TECHNIQUE: Multiplanar, multiecho pulse sequences of the brain and surrounding structures were obtained without intravenous contrast. COMPARISON:  MR angiography same day. Head CT done 2 days ago. MRI 09/02/2018. FINDINGS: Brain: Diffusion imaging shows a punctate acute infarction at the anterior inferior cerebellum on the right. There  is a 1.5 cm acute infarction at the inferior cerebellum on the left. Punctate acute infarction in the right pons. In the left cerebral hemisphere, there is a punctate acute infarction at the left parietooccipital cortex, 2 punctate acute infarctions in the right thalamus, and a punctate acute infarction at the left posterior frontal vertex and left parietal vertex. In the right hemisphere, there are small grouping of punctate acute infarctions in the right posterior temporal lobe an additional punctate acute infarctions in the right occipital lobe, right caudate head, right medial thalamus, right caudate body and right frontoparietal vertex regions. Findings are likely represent a shower of micro emboli from the heart or ascending aorta. Confluent chronic small vessel ischemic changes are seen throughout the cerebral hemispheric white matter. There is no acute hemorrhage. No hydrocephalus. No extra-axial collection. Generalized brain volume loss is present. Vascular: Major vessels at the base of the brain show flow. Skull and upper cervical  spine: Hypercellular marrow pattern in the cervical region. Sinuses/Orbits: Clear/normal Other: None IMPRESSION: 1. 1.5 cm acute infarction at the inferior cerebellum on the left. Punctate acute infarction at the anterior inferior cerebellum on the right. Punctate acute infarction in the right pons. 2. Punctate acute infarctions scattered throughout both cerebral hemispheres as above. Findings are consistent with a shower of micro emboli from the heart or ascending aorta. 3. Confluent chronic small-vessel ischemic changes throughout the hemispheric white matter. Generalized brain volume loss. Electronically Signed   By: Nelson Chimes M.D.   On: 01/19/2022 15:02   MR ANGIO HEAD WO CONTRAST  Result Date: 01/19/2022 CLINICAL DATA:  Neuro deficit, acute, stroke suspected. Small left inferior cerebellar infarction. EXAM: MRA HEAD WITHOUT CONTRAST TECHNIQUE: Angiographic images of  the Circle of Willis were acquired using MRA technique without intravenous contrast. COMPARISON:  CT 01/17/2022.  MRI same day. FINDINGS: Anterior circulation: Both internal carotid arteries are patent through the skull base and siphon regions. Pronounced atherosclerotic irregularity within the carotid siphon regions, possibly with atherosclerotic aneurysm formation, left more extensive than right. Severe stenosis of the left ICA at the proximal siphon and moderate stenosis of the right ICA at the proximal siphon. The anterior and middle cerebral vessels are patent. No large vessel occlusion or flow limiting proximal stenosis identified. No berry aneurysm. Posterior circulation: Both vertebral arteries are patent through the foramen magnum to the basilar artery. Moderate stenosis of the distal right vertebral artery just proximal to the basilar. No basilar stenosis. Flow is seen within the superior cerebellar and posterior cerebral arteries. There is a focal stenosis of the left P1 segment which could be flow limiting. Anatomic variants: None significant. Other: None. IMPRESSION: 1. No large vessel occlusion. 2. Atherosclerotic irregularity of the carotid siphon regions, left more extensive than right. Severe stenosis of the left ICA at the proximal siphon and moderate stenosis of the right ICA at the proximal siphon. Atherosclerotic aneurysm formation, left more extensive than right. This would probably be more accurately evaluated with CT angiography. 3. Focal stenosis of the left P1 segment which could be flow limiting. 4. Moderate stenosis of the distal right vertebral artery just proximal to the basilar. Electronically Signed   By: Nelson Chimes M.D.   On: 01/19/2022 14:57   DG CHEST PORT 1 VIEW  Result Date: 01/19/2022 CLINICAL DATA:  Ventilator dependence. EXAM: PORTABLE CHEST 1 VIEW COMPARISON:  01/17/2022 FINDINGS: 0823 hours. Endotracheal tube tip is 3.7 cm above the base of the carina. A feeding tube  passes into the stomach although the distal tip position is not included on the film. The cardio pericardial silhouette is enlarged. Right base collapse/consolidation is progressive in the interval with new 3 cm nodular component on today's exam. There is persistent retrocardiac left base collapse/consolidation with tiny left effusion. Interstitial markings are diffusely coarsened with chronic features. Telemetry leads overlie the chest. IMPRESSION: 1. Interval progression of right base collapse/consolidation with new 3 cm nodular component on today's exam. Close follow-up recommended. 2. Persistent retrocardiac left base collapse/consolidation with tiny left effusion. Electronically Signed   By: Misty Stanley M.D.   On: 01/19/2022 08:33   CT HEAD WO CONTRAST (5MM)  Result Date: 01/17/2022 CLINICAL DATA:  Mental status change with unknown cause. Cardiac arrest, assess for [facility] injury. EXAM: CT HEAD WITHOUT CONTRAST TECHNIQUE: Contiguous axial images were obtained from the base of the skull through the vertex without intravenous contrast. RADIATION DOSE REDUCTION: This exam was performed according to the departmental dose-optimization  program which includes automated exposure control, adjustment of the mA and/or kV according to patient size and/or use of iterative reconstruction technique. COMPARISON:  Five days ago FINDINGS: Brain: A small acute infarct is seen in the inferior left cerebellum. No generalized anoxic injury seen. Advanced chronic small vessel ischemia with confluent low-density in the cerebral white matter. Generalized atrophy. No hemorrhage, hydrocephalus, or mass. Vascular: No hyperdense vessel or unexpected calcification. Skull: Normal. Negative for fracture or focal lesion. Sinuses/Orbits: No acute finding.  Bilateral cataract resection IMPRESSION: 1. Small acute infarct in the inferior left cerebellum. 2. No indication of generalized anoxic injury. 3. Atrophy and extensive chronic small  vessel ischemia. Electronically Signed   By: Jorje Guild M.D.   On: 01/17/2022 11:57   DG CHEST PORT 1 VIEW  Result Date: 01/17/2022 CLINICAL DATA:  142230.  Pleural effusion and encephalopathy. EXAM: PORTABLE CHEST 1 VIEW COMPARISON:  Portable chest yesterday at 12:04 p.m. FINDINGS: 4:44 a.m. ETT tip is 3.5 cm from carina. Feeding tube enters the stomach with the full intragastric course not seen. Left chest dual lead pacing system and wire insertions are unaltered. Overlying telemetry leads and electrical pacer pads are also again shown. The heart is enlarged. There is perihilar vascular congestion, mild interstitial consolidation in the mid and lower lungs consistent with edema. This is slightly improved today. Patchy haziness in mid to lower lung fields, greater on the right is again noted and slightly improved. This could be ground-glass edema or pneumonitis. Dense left lower lobe consolidation continues to be seen and small pleural effusions. Stable mediastinum with aortic atherosclerosis. Thoracic cage is intact. IMPRESSION: Slight improvement in vascular engorgement, interstitial edema and lung opacities, with the exception of persistent dense left lower lobe consolidation. Small pleural effusions are similar. Electronically Signed   By: Telford Nab M.D.   On: 01/17/2022 07:31   DG Abd Portable 1V  Result Date: 01/16/2022 CLINICAL DATA:  Enteric tube placement. EXAM: PORTABLE ABDOMEN - 1 VIEW COMPARISON:  01/15/2022 FINDINGS: A small bore feeding tube is noted with tip overlying the distal stomach. No other enteric tubes are noted. Bowel gas pattern is unchanged. Lower lung opacities are not significantly changed. IMPRESSION: Small bore feeding tube with tip overlying the distal stomach. Electronically Signed   By: Margarette Canada M.D.   On: 01/16/2022 15:27   DG CHEST PORT 1 VIEW  Result Date: 01/16/2022 CLINICAL DATA:  Intubation EXAM: PORTABLE CHEST 1 VIEW COMPARISON:  01/16/2022 at 0952  hours FINDINGS: Interval placement of endotracheal tube with distal tip terminating 3.0 cm above the carina. Enteric tube courses below the diaphragm with distal tip beyond the inferior margin of the film. Right-sided implanted cardiac device remains in place. Overlying cardiac leads and pacer pad. Stable cardiomegaly. Aortic atherosclerosis. Significantly improved aeration of the left lung compared to the previous study. Bilateral perihilar interstitial opacities, right greater than left. No pneumothorax. IMPRESSION: 1. Interval placement of endotracheal tube with distal tip terminating 3.0 cm above the carina. 2. Significantly improved aeration of the left lung compared to the previous study. Electronically Signed   By: Davina Poke D.O.   On: 01/16/2022 12:31   DG CHEST PORT 1 VIEW  Result Date: 01/16/2022 CLINICAL DATA:  Pneumonia EXAM: PORTABLE CHEST 1 VIEW COMPARISON:  Chest radiograph dated 01/21/2022, CT chest dated 12/27/2021 FINDINGS: Lines/tubes: Right chest wall pacemaker with leads projecting over the right atrium and ventricle. Enteric tube tip reaches the diaphragm and terminates below the field of view. Chest: Complete  opacification of the left lung. Dense right basilar opacity and patchy right mid lung opacities. Pleura: Small to moderate right and large left pleural effusions. Heart/mediastinum: Cardiomediastinal silhouette is obscured. Aortic atherosclerosis. Bones: No acute osseous abnormality. IMPRESSION: 1. Large left and small to moderate right pleural effusions. 2. Complete opacification of the left lung. 3. Dense right basilar opacity and patchy right mid lung opacities likely reflect a combination of atelectasis and pneumonia. Electronically Signed   By: Darrin Nipper M.D.   On: 01/16/2022 10:07   DG Abd Portable 1V  Result Date: 01/15/2022 CLINICAL DATA:  Enteric tube placement EXAM: PORTABLE ABDOMEN - 1 VIEW COMPARISON:  01/13/2022 FINDINGS: Esophageal tube tip overlies the mid  to distal stomach. Pleural effusion and airspace disease at the bases appears worse compared to prior abdominal radiograph. IMPRESSION: Esophageal tube tip overlies the mid to distal stomach. Electronically Signed   By: Donavan Foil M.D.   On: 01/15/2022 16:18   DG Abd Portable 1V  Result Date: 01/13/2022 CLINICAL DATA:  OG tube placement EXAM: PORTABLE ABDOMEN - 1 VIEW COMPARISON:  01/13/2022 FINDINGS: OG tube tip in the mid to distal elongated stomach. No evidence of bowel obstruction. IMPRESSION: OG tube tip in the mid to distal stomach. Electronically Signed   By: Rolm Baptise M.D.   On: 01/13/2022 19:18   DG Abd Portable 1V  Result Date: 01/13/2022 CLINICAL DATA:  Encounter for orogastric tube placement. EXAM: PORTABLE ABDOMEN - 1 VIEW COMPARISON:  Radiograph earlier today. FINDINGS: The enteric tube tip and side port are below the diaphragm in the stomach, tip in the region of the gastric body. Nonobstructive upper abdominal bowel gas pattern. Pacemaker wires partially included. Bibasilar lung opacities, left-greater-than-right, better assessed on yesterday's CT. IMPRESSION: Enteric tube tip and side-port in the stomach, tip in the region of the gastric body. Electronically Signed   By: Keith Rake M.D.   On: 01/13/2022 18:55   DG Abd Portable 1V  Result Date: 01/13/2022 CLINICAL DATA:  Enteric tube placement in the setting of acute hypoxic respiratory failure requiring intubation and mechanical ventilation EXAM: PORTABLE ABDOMEN - 1 VIEW COMPARISON:  CT abdomen and pelvis dated 01/10/2022 FINDINGS: Enteric tube tip projects over the distal stomach/proximal duodenum Pacemaker wires terminate over the right atrium and right ventricle. Paucity of bowel gas. Bibasilar patchy opacities, better evaluated on prior CT. IMPRESSION: Enteric tube tip projects over the distal stomach/proximal duodenum. Electronically Signed   By: Darrin Nipper M.D.   On: 01/13/2022 16:22   ECHOCARDIOGRAM  COMPLETE  Result Date: 01/14/2022    ECHOCARDIOGRAM REPORT   Patient Name:   TALIK CASIQUE Date of Exam: 01/25/2022 Medical Rec #:  [id number]     Height:       69.0 in Accession #:    [id number]    Weight:       123.2 lb Date of Birth:  [birth year]     BSA:          1.682 m Patient Age:    57 years      BP:           95/65 mmHg Patient Gender: M             HR:           60 bpm. Exam Location:  Inpatient Procedure: 2D Echo, Color Doppler and Cardiac Doppler Indications:    R06.9 DOE  History:        Patient has prior history of Echocardiogram examinations,  most                 recent 01/05/2019. CHF, CAD, COPD; Risk Factors:Hypertension,                 Diabetes, Dyslipidemia, Sleep Apnea and Former Smoker.  Sonographer:    Raquel Sarna Senior RDCS Referring Phys: [id number] Omar Person  Sonographer Comments: Scanned supine on artificial respirator. IMPRESSIONS  1. Left ventricular ejection fraction, by estimation, is 45 to 50%. Left ventricular ejection fraction by 2D MOD biplane is 46.3 %. The left ventricle has mildly decreased function. The left ventricle demonstrates global hypokinesis. There is moderate left ventricular hypertrophy. Left ventricular diastolic parameters are consistent with Grade III diastolic dysfunction (restrictive). Elevated left ventricular end-diastolic pressure.  2. Right ventricular systolic function is moderately reduced. The right ventricular size is mildly enlarged. There is moderately elevated pulmonary artery systolic pressure. The estimated right ventricular systolic pressure is 14.9 mmHg.  3. Left atrial size was severely dilated.  4. Right atrial size was severely dilated.  5. The mitral valve is abnormal. Mild mitral valve regurgitation.  6. The tricuspid valve is abnormal. Tricuspid valve regurgitation is mild to moderate.  7. The aortic valve is tricuspid. Aortic valve regurgitation is trivial. Aortic valve sclerosis is present, with no evidence of aortic valve stenosis.  8.  Mildly dilated pulmonary artery.  9. The inferior vena cava is dilated in size with <50% respiratory variability, suggesting right atrial pressure of 15 mmHg. Comparison(s): Changes from prior study are noted. 01/05/2019: LVEF 55-60%. FINDINGS  Left Ventricle: Left ventricular ejection fraction, by estimation, is 45 to 50%. Left ventricular ejection fraction by 2D MOD biplane is 46.3 %. The left ventricle has mildly decreased function. The left ventricle demonstrates global hypokinesis. The left ventricular internal cavity size was normal in size. There is moderate left ventricular hypertrophy. Abnormal (paradoxical) septal motion, consistent with left bundle branch block. Left ventricular diastolic parameters are consistent with Grade III diastolic dysfunction (restrictive). Elevated left ventricular end-diastolic pressure. Right Ventricle: The right ventricular size is mildly enlarged. No increase in right ventricular wall thickness. Right ventricular systolic function is moderately reduced. There is moderately elevated pulmonary artery systolic pressure. The tricuspid regurgitant velocity is 2.78 m/s, and with an assumed right atrial pressure of 15 mmHg, the estimated right ventricular systolic pressure is 70.2 mmHg. Left Atrium: Left atrial size was severely dilated. Right Atrium: Right atrial size was severely dilated. Pericardium: There is no evidence of pericardial effusion. Mitral Valve: The mitral valve is abnormal. There is mild thickening of the anterior and posterior mitral valve leaflet(s). Mild mitral valve regurgitation. Tricuspid Valve: The tricuspid valve is abnormal. Tricuspid valve regurgitation is mild to moderate. Aortic Valve: The aortic valve is tricuspid. Aortic valve regurgitation is trivial. Aortic valve sclerosis is present, with no evidence of aortic valve stenosis. Pulmonic Valve: The pulmonic valve was grossly normal. Pulmonic valve regurgitation is trivial. Aorta: The aortic root and  ascending aorta are structurally normal, with no evidence of dilitation. Pulmonary Artery: The pulmonary artery is mildly dilated. Venous: The inferior vena cava is dilated in size with less than 50% respiratory variability, suggesting right atrial pressure of 15 mmHg. IAS/Shunts: No atrial level shunt detected by color flow Doppler. Additional Comments: There is pleural effusion in the left lateral region.  LEFT VENTRICLE PLAX 2D                        Biplane EF (MOD) LVIDd:  3.50 cm         LV Biplane EF:   Left LVIDs:         2.60 cm                          ventricular LV PW:         1.60 cm                          ejection LV IVS:        1.30 cm                          fraction by LVOT diam:     2.00 cm                          2D MOD LV SV:         62                               biplane is LV SV Index:   37                               46.3 %. LVOT Area:     3.14 cm                                Diastology                                LV e' medial:    6.96 cm/s LV Volumes (MOD)               LV E/e' medial:  14.2 LV vol d, MOD    110.0 ml      LV e' lateral:   9.14 cm/s A2C:                           LV E/e' lateral: 10.8 LV vol d, MOD    125.0 ml A4C: LV vol s, MOD    56.6 ml A2C: LV vol s, MOD    73.5 ml A4C: LV SV MOD A2C:   53.4 ml LV SV MOD A4C:   125.0 ml LV SV MOD BP:    53.4 ml RIGHT VENTRICLE RV S prime:     6.96 cm/s TAPSE (M-mode): 1.1 cm LEFT ATRIUM              Index        RIGHT ATRIUM           Index LA diam:        3.90 cm  2.32 cm/m   RA Area:     31.00 cm LA Vol (A2C):   137.0 ml 81.46 ml/m  RA Volume:   115.00 ml 68.38 ml/m LA Vol (A4C):   82.1 ml  48.82 ml/m LA Biplane Vol: 111.0 ml 66.00 ml/m  AORTIC VALVE LVOT Vmax:   110.00 cm/s LVOT Vmean:  77.900 cm/s LVOT VTI:    0.196 m  AORTA Ao Root diam: 3.30 cm Ao Asc diam:  2.90 cm MITRAL VALVE  TRICUSPID VALVE MV Area (PHT): 3.37 cm    TR Peak grad:   30.9 mmHg MV Decel Time: 225 msec    TR Vmax:         278.00 cm/s MV E velocity: 98.50 cm/s MV A velocity: 43.90 cm/s  SHUNTS MV E/A ratio:  2.24        Systemic VTI:  0.20 m                            Systemic Diam: 2.00 cm Lyman Bishop MD Electronically signed by Lyman Bishop MD Signature Date/Time: 01/14/2022/6:17:08 PM    Final    EEG adult  Result Date: 01/25/2022 Lora Havens, MD     01/13/2022 10:31 AM Patient Name: DEVEON KISIEL MRN: [id number] Epilepsy Attending: Lora Havens Referring Physician/Provider: Kipp Brood, MD Date: 12/31/2021 Duration: 22.24 mins Patient history: 88 year old male with altered mental status.  EEG to evaluate for seizure. Level of alertness: Awake AEDs during EEG study: None Technical aspects: This EEG study was done with scalp electrodes positioned according to the 10-20 International system of electrode placement. Electrical activity was reviewed with band pass filter of 1-_0 , sensitivity of 7 uV/mm, display speed of 60m/sec with a _1  notched filter applied as appropriate. EEG data were recorded continuously and digitally stored.  Video monitoring was available and reviewed as appropriate. Description: No clear posterior dominant rhythm was seen. EEG showed continuous generalized predominantly 5 to 6 Hz theta slowing admixed with intermittent generalized 2-_2  delta slowing.  Hyperventilation and photic stimulation were not performed.   ABNORMALITY - Continuous slow, generalized IMPRESSION: This study is suggestive of moderate diffuse encephalopathy, nonspecific etiology. No seizures or epileptiform discharges were seen throughout the recording. PLora Havens  CT CHEST ABDOMEN PELVIS W CONTRAST  Result Date: 01/06/2022 CLINICAL DATA:  Sepsis.  Found down. EXAM: CT CHEST, ABDOMEN, AND PELVIS WITH CONTRAST TECHNIQUE: Multidetector CT imaging of the chest, abdomen and pelvis was performed following the standard protocol during bolus administration of intravenous contrast. RADIATION DOSE REDUCTION: This  exam was performed according to the departmental dose-optimization program which includes automated exposure control, adjustment of the mA and/or kV according to patient size and/or use of iterative reconstruction technique. CONTRAST:  741mOMNIPAQUE IOHEXOL 350 MG/ML SOLN COMPARISON:  Chest x-ray, same date. FINDINGS: CT CHEST FINDINGS Cardiovascular: The heart is mildly enlarged. No pericardial effusion. The pacer wires are in good position without complicating features. Advanced aortic and coronary artery calcifications. 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 Recommend non urgent thoracic surgery consultation. No aortic dissection. Stable three-vessel coronary artery calcifications. Mediastinum/Nodes: Scattered mediastinal hilar lymph nodes likely reactive given lung findings. There is an NG tube coursing down the esophagus and into the stomach. The endotracheal tube is in good position at the mid tracheal level. Lungs/Pleura: Extensive bilateral pulmonary infiltrates with dense left lower lobe airspace consolidation. Findings highly suspicious for aspiration. Underlying emphysematous changes and pulmonary scarring. Small bilateral pleural effusions. Musculoskeletal: No significant bony findings. The sternum, ribs and thoracic vertebral bodies are intact. CT ABDOMEN PELVIS FINDINGS Hepatobiliary: No hepatic lesions or intrahepatic biliary dilatation. Small blush of contrast enhancement in the left hepatic lobe not present on delayed images. This is likely a small vascular shunt. Gallbladder is grossly normal. It is surrounded by fluid. No common bile duct dilatation. Pancreas: No mass, inflammation or  ductal dilatation. Spleen: Normal size.  No focal lesions. Adrenals/Urinary Tract: The adrenal glands and kidneys are unremarkable. Moderate renal cortical thinning on the right side but no worrisome renal lesions or hydronephrosis. The  bladder contains a Foley catheter. Stomach/Bowel: The stomach, duodenum, small bowel and colon are grossly normal. No obvious acute inflammatory process, mass lesions or obstructive findings. Vascular/Lymphatic: Advanced atherosclerotic calcifications involving the aorta and branch vessels but no aneurysm or dissection. The major venous structures are patent. No mesenteric or retroperitoneal mass or adenopathy. Reproductive: The prostate gland and seminal vesicles are unremarkable. Other: Small amount of free abdominal and free pelvic fluid along with diffuse body wall edema suggesting anasarca. Right inguinal hernia contains small bowel loops. I do not see any findings for incarceration or obstruction. Musculoskeletal: No significant bony findings. No spine or pelvic fractures are identified. The pubic symphysis and SI joints are intact. IMPRESSION: 1. Extensive bilateral pulmonary infiltrates with dense left lower lobe airspace consolidation. Findings highly suspicious for aspiration pneumonia. 2. Small bilateral pleural effusions. 3. No acute abdominal/pelvic findings. 4. Small amount of free abdominal and free pelvic fluid along with diffuse body wall edema suggesting anasarca. 5. Right inguinal hernia contains small bowel loops. I do not see any findings for incarceration or obstruction. 6. Advanced atherosclerotic calcifications involving the thoracic and abdominal aorta and branch vessels including the coronary arteries. 7. 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 Recommend non urgent thoracic surgery consultation. Electronically Signed   By: Marijo Sanes M.D.   On: 01/19/2022 13:07   CT Head Wo Contrast  Result Date: 12/30/2021 CLINICAL DATA:  Delirium.  Found down this morning. EXAM: CT HEAD WITHOUT CONTRAST TECHNIQUE: Contiguous axial images were obtained from the base of the skull through the vertex without intravenous contrast.  RADIATION DOSE REDUCTION: This exam was performed according to the departmental dose-optimization program which includes automated exposure control, adjustment of the mA and/or kV according to patient size and/or use of iterative reconstruction technique. COMPARISON:  Head CT 11/16/2021 FINDINGS: Brain: Stable age related advanced cerebral atrophy, ventriculomegaly and periventricular white matter disease. No extra-axial fluid collections are identified. No CT findings for acute hemispheric infarction or intracranial hemorrhage. No mass lesions. The brainstem and cerebellum are normal. Vascular: Stable vascular calcifications.  No hyperdense vessels. Skull: No skull fracture or bone lesions. Sinuses/Orbits: The paranasal sinuses and mastoid air cells are clear. The globes are intact. Other: No scalp lesions or scalp hematoma. IMPRESSION: 1. Stable age related advanced cerebral atrophy, ventriculomegaly and periventricular white matter disease. 2. No acute intracranial findings or skull fracture. Electronically Signed   By: Marijo Sanes M.D.   On: 01/21/2022 12:30   DG Chest Portable 1 View  Result Date: 12/31/2021 CLINICAL DATA:  88 year old male intubated. EXAM: PORTABLE CHEST 1 VIEW COMPARISON:  Portable chest 1021 hours today and earlier. FINDINGS: Portable AP semi upright view at 1138 hours. Endotracheal tube tip in good position between the level the clavicles and carina. Enteric tube courses to the abdomen, side hole is near the expected level of the GEJ at or just below the diaphragm. Stable lung volumes. Stable cardiomegaly and mediastinal contours. Right chest dual lead cardiac pacemaker. Left greater than right dense peribronchial and lower lobe opacity, no definite air bronchograms. Left pleural effusion is not excluded. Pulmonary vascularity does not appear significantly changed since August. No pneumothorax. Calcified aortic atherosclerosis. No acute osseous abnormality identified. IMPRESSION:  1.  Endotracheal tube tip in good position. Enteric tube side hole near the GEJ, advanced 5 cm to ensure side hole placement inside the stomach. 2. Stable ventilation with confluent lower lung left > right opacity. Left pleural effusion is possible. No overt edema. Electronically Signed   By: Genevie Ann M.D.   On: 01/23/2022 11:49   DG Chest Port 1 View  Result Date: 12/27/2021 CLINICAL DATA:  Questionable sepsis EXAM: PORTABLE CHEST 1 VIEW COMPARISON:  11/16/2021 FINDINGS: Indistinct airspace disease on the left more than right. Cardiomegaly with dual-chamber pacer leads from the right. No definite effusion, although possible on the left. No pneumothorax. IMPRESSION: Bilateral airspace disease which could be edema or pneumonia. A similar appearance was seen 11/16/2021 Electronically Signed   By: Jorje Guild M.D.   On: 01/07/2022 10:30    Microbiology: Recent Results (from the past 240 hour(s))  MRSA Next Gen by PCR, Nasal     Status: None   Collection Time: 01/24/22  4:53 PM   Specimen: Nasal Mucosa; Nasal Swab  Result Value Ref Range Status   MRSA by PCR Next Gen NOT DETECTED NOT DETECTED Final    Comment: (NOTE) The GeneXpert MRSA Assay (FDA approved for NASAL specimens only), is one component of a comprehensive MRSA colonization surveillance program. It is not intended to diagnose MRSA infection nor to guide or monitor treatment for MRSA infections. Test performance is not FDA approved in patients less than 41 years old. Performed at [facility], [place] [address], [place], [place] [address]       Signed: Tawni Millers, MD

## 2022-02-26 DEATH — deceased
